# Patient Record
Sex: Female | Born: 1950
Health system: Southern US, Community
[De-identification: ages and names within clinical notes are randomized; demographics above are authoritative.]

## PROBLEM LIST (undated history)

## (undated) DIAGNOSIS — F419 Anxiety disorder, unspecified: Secondary | ICD-10-CM

## (undated) DIAGNOSIS — E039 Hypothyroidism, unspecified: Secondary | ICD-10-CM

## (undated) DIAGNOSIS — J45909 Unspecified asthma, uncomplicated: Secondary | ICD-10-CM

## (undated) DIAGNOSIS — E0520 Thyrotoxicosis with toxic multinodular goiter without thyrotoxic crisis or storm: Secondary | ICD-10-CM

## (undated) DIAGNOSIS — L659 Nonscarring hair loss, unspecified: Secondary | ICD-10-CM

## (undated) DIAGNOSIS — I4719 Other supraventricular tachycardia: Secondary | ICD-10-CM

## (undated) DIAGNOSIS — I10 Essential (primary) hypertension: Secondary | ICD-10-CM

## (undated) DIAGNOSIS — K219 Gastro-esophageal reflux disease without esophagitis: Secondary | ICD-10-CM

## (undated) DIAGNOSIS — Z972 Presence of dental prosthetic device (complete) (partial): Secondary | ICD-10-CM

## (undated) DIAGNOSIS — Z860100 Personal history of colon polyps, unspecified: Secondary | ICD-10-CM

## (undated) DIAGNOSIS — I700 Atherosclerosis of aorta: Secondary | ICD-10-CM

## (undated) DIAGNOSIS — Z973 Presence of spectacles and contact lenses: Secondary | ICD-10-CM

## (undated) DIAGNOSIS — I493 Ventricular premature depolarization: Secondary | ICD-10-CM

## (undated) DIAGNOSIS — M109 Gout, unspecified: Secondary | ICD-10-CM

## (undated) DIAGNOSIS — E785 Hyperlipidemia, unspecified: Secondary | ICD-10-CM

## (undated) DIAGNOSIS — I472 Ventricular tachycardia: Secondary | ICD-10-CM

## (undated) DIAGNOSIS — L309 Dermatitis, unspecified: Secondary | ICD-10-CM

## (undated) DIAGNOSIS — R011 Cardiac murmur, unspecified: Secondary | ICD-10-CM

## (undated) DIAGNOSIS — Z87898 Personal history of other specified conditions: Secondary | ICD-10-CM

## (undated) DIAGNOSIS — M1710 Unilateral primary osteoarthritis, unspecified knee: Secondary | ICD-10-CM

## (undated) DIAGNOSIS — M179 Osteoarthritis of knee, unspecified: Secondary | ICD-10-CM

## (undated) DIAGNOSIS — E119 Type 2 diabetes mellitus without complications: Secondary | ICD-10-CM

## (undated) HISTORY — PX: CATARACT EXTRACTION W/ INTRAOCULAR LENS IMPLANT: SHX1309

## (undated) HISTORY — DX: Cardiac murmur, unspecified: R01.1

## (undated) HISTORY — DX: Ventricular tachycardia: I47.2

## (undated) HISTORY — DX: Atherosclerosis of aorta: I70.0

## (undated) HISTORY — PX: COLONOSCOPY: SHX174

## (undated) HISTORY — DX: Ventricular premature depolarization: I49.3

## (undated) HISTORY — DX: Other supraventricular tachycardia: I47.19

## (undated) HISTORY — DX: Anxiety disorder, unspecified: F41.9

## (undated) HISTORY — PX: LAPAROSCOPIC CHOLECYSTECTOMY: SUR755

## (undated) HISTORY — PX: RASTELLI PROCEDURE: SHX2299

## (undated) HISTORY — PX: OTHER SURGICAL HISTORY: SHX169

---

## 1998-02-16 ENCOUNTER — Encounter: Admission: RE | Admit: 1998-02-16 | Discharge: 1998-02-16 | Payer: Self-pay | Admitting: Family Medicine

## 1998-03-15 ENCOUNTER — Encounter: Admission: RE | Admit: 1998-03-15 | Discharge: 1998-03-15 | Payer: Self-pay | Admitting: Sports Medicine

## 1998-03-15 ENCOUNTER — Other Ambulatory Visit: Admission: RE | Admit: 1998-03-15 | Discharge: 1998-03-15 | Payer: Self-pay

## 1998-09-05 ENCOUNTER — Encounter: Admission: RE | Admit: 1998-09-05 | Discharge: 1998-09-05 | Payer: Self-pay | Admitting: Sports Medicine

## 1998-09-23 ENCOUNTER — Encounter: Admission: RE | Admit: 1998-09-23 | Discharge: 1998-09-23 | Payer: Self-pay | Admitting: Family Medicine

## 1999-04-25 ENCOUNTER — Encounter: Admission: RE | Admit: 1999-04-25 | Discharge: 1999-04-25 | Payer: Self-pay | Admitting: Family Medicine

## 1999-05-08 ENCOUNTER — Encounter: Admission: RE | Admit: 1999-05-08 | Discharge: 1999-05-08 | Payer: Self-pay | Admitting: Family Medicine

## 1999-05-23 ENCOUNTER — Encounter: Admission: RE | Admit: 1999-05-23 | Discharge: 1999-05-23 | Payer: Self-pay | Admitting: Sports Medicine

## 1999-07-19 ENCOUNTER — Encounter (INDEPENDENT_AMBULATORY_CARE_PROVIDER_SITE_OTHER): Payer: Self-pay

## 1999-07-19 ENCOUNTER — Other Ambulatory Visit: Admission: RE | Admit: 1999-07-19 | Discharge: 1999-07-19 | Payer: Self-pay | Admitting: Obstetrics & Gynecology

## 1999-10-09 ENCOUNTER — Inpatient Hospital Stay (HOSPITAL_COMMUNITY): Admission: EM | Admit: 1999-10-09 | Discharge: 1999-10-09 | Payer: Self-pay | Admitting: Obstetrics & Gynecology

## 1999-10-17 ENCOUNTER — Encounter: Payer: Self-pay | Admitting: Obstetrics & Gynecology

## 1999-10-22 HISTORY — PX: ABDOMINAL HYSTERECTOMY: SHX81

## 1999-10-23 ENCOUNTER — Inpatient Hospital Stay (HOSPITAL_COMMUNITY): Admission: RE | Admit: 1999-10-23 | Discharge: 1999-10-26 | Payer: Self-pay | Admitting: Obstetrics & Gynecology

## 1999-10-23 ENCOUNTER — Encounter (INDEPENDENT_AMBULATORY_CARE_PROVIDER_SITE_OTHER): Payer: Self-pay

## 1999-11-29 ENCOUNTER — Encounter: Admission: RE | Admit: 1999-11-29 | Discharge: 1999-11-29 | Payer: Self-pay | Admitting: Family Medicine

## 1999-12-13 ENCOUNTER — Encounter: Admission: RE | Admit: 1999-12-13 | Discharge: 1999-12-13 | Payer: Self-pay

## 2000-11-20 ENCOUNTER — Encounter: Admission: RE | Admit: 2000-11-20 | Discharge: 2000-11-20 | Payer: Self-pay | Admitting: Family Medicine

## 2000-11-27 ENCOUNTER — Encounter: Admission: RE | Admit: 2000-11-27 | Discharge: 2000-11-27 | Payer: Self-pay | Admitting: Family Medicine

## 2000-12-27 ENCOUNTER — Encounter: Admission: RE | Admit: 2000-12-27 | Discharge: 2000-12-27 | Payer: Self-pay | Admitting: Family Medicine

## 2001-01-02 ENCOUNTER — Encounter: Payer: Self-pay | Admitting: Sports Medicine

## 2001-01-02 ENCOUNTER — Encounter: Admission: RE | Admit: 2001-01-02 | Discharge: 2001-01-02 | Payer: Self-pay | Admitting: Sports Medicine

## 2001-01-30 ENCOUNTER — Encounter: Admission: RE | Admit: 2001-01-30 | Discharge: 2001-01-30 | Payer: Self-pay | Admitting: Family Medicine

## 2001-03-10 ENCOUNTER — Encounter: Admission: RE | Admit: 2001-03-10 | Discharge: 2001-03-10 | Payer: Self-pay | Admitting: Family Medicine

## 2001-05-23 ENCOUNTER — Encounter (INDEPENDENT_AMBULATORY_CARE_PROVIDER_SITE_OTHER): Payer: Self-pay | Admitting: *Deleted

## 2001-05-23 LAB — CONVERTED CEMR LAB

## 2001-05-26 ENCOUNTER — Encounter: Admission: RE | Admit: 2001-05-26 | Discharge: 2001-05-26 | Payer: Self-pay | Admitting: Sports Medicine

## 2001-06-04 ENCOUNTER — Encounter: Admission: RE | Admit: 2001-06-04 | Discharge: 2001-06-04 | Payer: Self-pay | Admitting: Family Medicine

## 2001-06-16 ENCOUNTER — Encounter: Admission: RE | Admit: 2001-06-16 | Discharge: 2001-06-16 | Payer: Self-pay | Admitting: Family Medicine

## 2001-06-30 ENCOUNTER — Encounter: Admission: RE | Admit: 2001-06-30 | Discharge: 2001-06-30 | Payer: Self-pay | Admitting: Family Medicine

## 2001-07-31 ENCOUNTER — Encounter: Admission: RE | Admit: 2001-07-31 | Discharge: 2001-07-31 | Payer: Self-pay | Admitting: Family Medicine

## 2001-08-12 ENCOUNTER — Encounter: Payer: Self-pay | Admitting: Sports Medicine

## 2001-08-12 ENCOUNTER — Ambulatory Visit (HOSPITAL_COMMUNITY): Admission: RE | Admit: 2001-08-12 | Discharge: 2001-08-12 | Payer: Self-pay | Admitting: Sports Medicine

## 2001-08-21 ENCOUNTER — Ambulatory Visit (HOSPITAL_COMMUNITY): Admission: RE | Admit: 2001-08-21 | Discharge: 2001-08-21 | Payer: Self-pay | Admitting: Internal Medicine

## 2001-09-30 ENCOUNTER — Encounter: Admission: RE | Admit: 2001-09-30 | Discharge: 2001-09-30 | Payer: Self-pay | Admitting: Sports Medicine

## 2001-11-17 ENCOUNTER — Encounter: Admission: RE | Admit: 2001-11-17 | Discharge: 2001-11-17 | Payer: Self-pay | Admitting: Sports Medicine

## 2002-01-15 ENCOUNTER — Encounter: Admission: RE | Admit: 2002-01-15 | Discharge: 2002-01-15 | Payer: Self-pay | Admitting: Sports Medicine

## 2002-04-27 ENCOUNTER — Encounter: Admission: RE | Admit: 2002-04-27 | Discharge: 2002-04-27 | Payer: Self-pay | Admitting: Family Medicine

## 2002-05-25 ENCOUNTER — Encounter: Admission: RE | Admit: 2002-05-25 | Discharge: 2002-05-25 | Payer: Self-pay | Admitting: Family Medicine

## 2002-06-04 ENCOUNTER — Encounter: Admission: RE | Admit: 2002-06-04 | Discharge: 2002-06-04 | Payer: Self-pay | Admitting: Family Medicine

## 2002-06-30 ENCOUNTER — Encounter: Payer: Self-pay | Admitting: Sports Medicine

## 2002-06-30 ENCOUNTER — Encounter: Admission: RE | Admit: 2002-06-30 | Discharge: 2002-06-30 | Payer: Self-pay | Admitting: Sports Medicine

## 2002-07-12 ENCOUNTER — Encounter: Payer: Self-pay | Admitting: Emergency Medicine

## 2002-07-12 ENCOUNTER — Emergency Department (HOSPITAL_COMMUNITY): Admission: EM | Admit: 2002-07-12 | Discharge: 2002-07-12 | Payer: Self-pay | Admitting: Emergency Medicine

## 2002-07-30 ENCOUNTER — Encounter: Admission: RE | Admit: 2002-07-30 | Discharge: 2002-07-30 | Payer: Self-pay | Admitting: Sports Medicine

## 2002-08-28 ENCOUNTER — Emergency Department (HOSPITAL_COMMUNITY): Admission: EM | Admit: 2002-08-28 | Discharge: 2002-08-28 | Payer: Self-pay | Admitting: Emergency Medicine

## 2002-08-28 ENCOUNTER — Encounter: Payer: Self-pay | Admitting: Emergency Medicine

## 2002-08-31 ENCOUNTER — Ambulatory Visit (HOSPITAL_COMMUNITY): Admission: RE | Admit: 2002-08-31 | Discharge: 2002-08-31 | Payer: Self-pay | Admitting: Emergency Medicine

## 2002-08-31 ENCOUNTER — Encounter: Payer: Self-pay | Admitting: Emergency Medicine

## 2002-09-09 ENCOUNTER — Encounter: Admission: RE | Admit: 2002-09-09 | Discharge: 2002-09-09 | Payer: Self-pay | Admitting: Family Medicine

## 2002-09-11 ENCOUNTER — Encounter: Admission: RE | Admit: 2002-09-11 | Discharge: 2002-09-11 | Payer: Self-pay | Admitting: Family Medicine

## 2002-09-14 ENCOUNTER — Encounter: Admission: RE | Admit: 2002-09-14 | Discharge: 2002-09-14 | Payer: Self-pay | Admitting: Family Medicine

## 2002-09-21 ENCOUNTER — Encounter: Admission: RE | Admit: 2002-09-21 | Discharge: 2002-09-21 | Payer: Self-pay | Admitting: Family Medicine

## 2002-10-01 ENCOUNTER — Encounter (INDEPENDENT_AMBULATORY_CARE_PROVIDER_SITE_OTHER): Payer: Self-pay | Admitting: Specialist

## 2002-10-01 ENCOUNTER — Encounter: Payer: Self-pay | Admitting: Surgery

## 2002-10-01 ENCOUNTER — Observation Stay (HOSPITAL_COMMUNITY): Admission: RE | Admit: 2002-10-01 | Discharge: 2002-10-02 | Payer: Self-pay | Admitting: Surgery

## 2002-10-15 ENCOUNTER — Encounter: Payer: Self-pay | Admitting: Surgery

## 2002-10-15 ENCOUNTER — Ambulatory Visit (HOSPITAL_COMMUNITY): Admission: RE | Admit: 2002-10-15 | Discharge: 2002-10-15 | Payer: Self-pay | Admitting: Surgery

## 2002-12-25 ENCOUNTER — Encounter: Admission: RE | Admit: 2002-12-25 | Discharge: 2002-12-25 | Payer: Self-pay | Admitting: Family Medicine

## 2002-12-28 ENCOUNTER — Encounter: Admission: RE | Admit: 2002-12-28 | Discharge: 2002-12-28 | Payer: Self-pay | Admitting: Sports Medicine

## 2002-12-28 ENCOUNTER — Encounter: Payer: Self-pay | Admitting: Sports Medicine

## 2002-12-30 ENCOUNTER — Encounter: Admission: RE | Admit: 2002-12-30 | Discharge: 2002-12-30 | Payer: Self-pay | Admitting: Family Medicine

## 2003-02-02 ENCOUNTER — Encounter: Admission: RE | Admit: 2003-02-02 | Discharge: 2003-02-02 | Payer: Self-pay | Admitting: Family Medicine

## 2003-03-02 ENCOUNTER — Encounter: Admission: RE | Admit: 2003-03-02 | Discharge: 2003-03-02 | Payer: Self-pay | Admitting: Family Medicine

## 2003-03-11 ENCOUNTER — Ambulatory Visit (HOSPITAL_COMMUNITY): Admission: RE | Admit: 2003-03-11 | Discharge: 2003-03-11 | Payer: Self-pay | Admitting: Sports Medicine

## 2003-03-11 ENCOUNTER — Encounter: Payer: Self-pay | Admitting: Cardiovascular Disease

## 2003-03-18 ENCOUNTER — Encounter: Admission: RE | Admit: 2003-03-18 | Discharge: 2003-03-18 | Payer: Self-pay | Admitting: Family Medicine

## 2003-04-23 ENCOUNTER — Encounter: Admission: RE | Admit: 2003-04-23 | Discharge: 2003-04-23 | Payer: Self-pay | Admitting: Sports Medicine

## 2003-10-25 ENCOUNTER — Encounter: Admission: RE | Admit: 2003-10-25 | Discharge: 2003-10-25 | Payer: Self-pay | Admitting: Family Medicine

## 2003-10-31 ENCOUNTER — Ambulatory Visit (HOSPITAL_BASED_OUTPATIENT_CLINIC_OR_DEPARTMENT_OTHER): Admission: RE | Admit: 2003-10-31 | Discharge: 2003-10-31 | Payer: Self-pay | Admitting: Sports Medicine

## 2003-11-08 ENCOUNTER — Encounter: Admission: RE | Admit: 2003-11-08 | Discharge: 2003-11-08 | Payer: Self-pay | Admitting: Sports Medicine

## 2003-11-11 ENCOUNTER — Encounter: Admission: RE | Admit: 2003-11-11 | Discharge: 2003-11-11 | Payer: Self-pay | Admitting: Family Medicine

## 2003-11-15 ENCOUNTER — Encounter: Admission: RE | Admit: 2003-11-15 | Discharge: 2003-11-15 | Payer: Self-pay | Admitting: Family Medicine

## 2003-11-21 HISTORY — PX: RENAL ARTERY STENT: SHX2321

## 2003-11-23 ENCOUNTER — Ambulatory Visit (HOSPITAL_COMMUNITY): Admission: RE | Admit: 2003-11-23 | Discharge: 2003-11-23 | Payer: Self-pay | Admitting: Sports Medicine

## 2003-12-09 ENCOUNTER — Ambulatory Visit (HOSPITAL_COMMUNITY): Admission: RE | Admit: 2003-12-09 | Discharge: 2003-12-09 | Payer: Self-pay | Admitting: *Deleted

## 2003-12-16 ENCOUNTER — Encounter: Admission: RE | Admit: 2003-12-16 | Discharge: 2003-12-16 | Payer: Self-pay | Admitting: Family Medicine

## 2004-02-11 ENCOUNTER — Encounter: Admission: RE | Admit: 2004-02-11 | Discharge: 2004-02-11 | Payer: Self-pay | Admitting: Family Medicine

## 2004-03-29 ENCOUNTER — Ambulatory Visit: Payer: Self-pay | Admitting: Family Medicine

## 2004-05-22 ENCOUNTER — Encounter: Admission: RE | Admit: 2004-05-22 | Discharge: 2004-05-22 | Payer: Self-pay | Admitting: Sports Medicine

## 2004-06-29 ENCOUNTER — Ambulatory Visit (HOSPITAL_COMMUNITY): Admission: RE | Admit: 2004-06-29 | Discharge: 2004-06-29 | Payer: Self-pay | Admitting: Gastroenterology

## 2004-06-29 ENCOUNTER — Encounter (INDEPENDENT_AMBULATORY_CARE_PROVIDER_SITE_OTHER): Payer: Self-pay | Admitting: *Deleted

## 2004-09-01 ENCOUNTER — Ambulatory Visit: Payer: Self-pay | Admitting: Family Medicine

## 2004-09-04 ENCOUNTER — Ambulatory Visit: Payer: Self-pay | Admitting: Family Medicine

## 2004-09-12 ENCOUNTER — Ambulatory Visit: Payer: Self-pay | Admitting: Sports Medicine

## 2004-09-27 ENCOUNTER — Ambulatory Visit: Payer: Self-pay | Admitting: Family Medicine

## 2004-10-02 ENCOUNTER — Ambulatory Visit: Payer: Self-pay | Admitting: Family Medicine

## 2005-01-31 ENCOUNTER — Ambulatory Visit: Payer: Self-pay | Admitting: Family Medicine

## 2005-02-08 ENCOUNTER — Encounter: Admission: RE | Admit: 2005-02-08 | Discharge: 2005-02-08 | Payer: Self-pay | Admitting: Sports Medicine

## 2005-02-20 HISTORY — PX: RENAL ARTERY STENT: SHX2321

## 2005-02-21 ENCOUNTER — Ambulatory Visit: Payer: Self-pay | Admitting: Internal Medicine

## 2005-02-22 ENCOUNTER — Ambulatory Visit (HOSPITAL_COMMUNITY): Admission: RE | Admit: 2005-02-22 | Discharge: 2005-02-22 | Payer: Self-pay | Admitting: Sports Medicine

## 2005-03-20 ENCOUNTER — Ambulatory Visit: Payer: Self-pay | Admitting: Internal Medicine

## 2005-03-28 ENCOUNTER — Ambulatory Visit: Payer: Self-pay | Admitting: Internal Medicine

## 2005-04-30 ENCOUNTER — Ambulatory Visit: Payer: Self-pay | Admitting: Internal Medicine

## 2005-05-01 ENCOUNTER — Ambulatory Visit: Payer: Self-pay | Admitting: Internal Medicine

## 2005-05-24 ENCOUNTER — Ambulatory Visit: Payer: Self-pay | Admitting: Internal Medicine

## 2005-05-31 ENCOUNTER — Ambulatory Visit: Payer: Self-pay | Admitting: Internal Medicine

## 2005-08-28 ENCOUNTER — Encounter: Admission: RE | Admit: 2005-08-28 | Discharge: 2005-08-28 | Payer: Self-pay | Admitting: Internal Medicine

## 2005-09-20 ENCOUNTER — Ambulatory Visit: Payer: Self-pay | Admitting: Internal Medicine

## 2005-10-02 ENCOUNTER — Ambulatory Visit: Payer: Self-pay | Admitting: Internal Medicine

## 2005-11-22 ENCOUNTER — Ambulatory Visit: Payer: Self-pay | Admitting: Internal Medicine

## 2005-12-06 ENCOUNTER — Ambulatory Visit: Payer: Self-pay | Admitting: Internal Medicine

## 2005-12-19 ENCOUNTER — Encounter: Admission: RE | Admit: 2005-12-19 | Discharge: 2005-12-19 | Payer: Self-pay | Admitting: Surgery

## 2005-12-20 ENCOUNTER — Encounter (INDEPENDENT_AMBULATORY_CARE_PROVIDER_SITE_OTHER): Payer: Self-pay | Admitting: *Deleted

## 2005-12-20 ENCOUNTER — Ambulatory Visit (HOSPITAL_BASED_OUTPATIENT_CLINIC_OR_DEPARTMENT_OTHER): Admission: RE | Admit: 2005-12-20 | Discharge: 2005-12-20 | Payer: Self-pay | Admitting: Surgery

## 2006-03-13 ENCOUNTER — Ambulatory Visit: Payer: Self-pay | Admitting: Internal Medicine

## 2006-05-17 ENCOUNTER — Ambulatory Visit: Payer: Self-pay | Admitting: Internal Medicine

## 2006-05-27 ENCOUNTER — Ambulatory Visit: Payer: Self-pay | Admitting: Internal Medicine

## 2006-05-27 LAB — CONVERTED CEMR LAB
Calcium: 9.5 mg/dL (ref 8.4–10.5)
Creatinine, Ser: 1.3 mg/dL — ABNORMAL HIGH (ref 0.4–1.2)
Sodium: 139 meq/L (ref 135–145)

## 2006-06-14 ENCOUNTER — Ambulatory Visit: Payer: Self-pay | Admitting: Internal Medicine

## 2006-06-15 ENCOUNTER — Emergency Department (HOSPITAL_COMMUNITY): Admission: EM | Admit: 2006-06-15 | Discharge: 2006-06-15 | Payer: Self-pay | Admitting: Emergency Medicine

## 2006-06-25 ENCOUNTER — Ambulatory Visit: Payer: Self-pay | Admitting: Internal Medicine

## 2006-09-19 DIAGNOSIS — E049 Nontoxic goiter, unspecified: Secondary | ICD-10-CM

## 2006-09-19 DIAGNOSIS — G4700 Insomnia, unspecified: Secondary | ICD-10-CM | POA: Insufficient documentation

## 2006-09-19 DIAGNOSIS — I10 Essential (primary) hypertension: Secondary | ICD-10-CM

## 2006-09-19 DIAGNOSIS — L2089 Other atopic dermatitis: Secondary | ICD-10-CM | POA: Insufficient documentation

## 2006-09-19 DIAGNOSIS — J45909 Unspecified asthma, uncomplicated: Secondary | ICD-10-CM | POA: Insufficient documentation

## 2006-09-20 ENCOUNTER — Encounter (INDEPENDENT_AMBULATORY_CARE_PROVIDER_SITE_OTHER): Payer: Self-pay | Admitting: *Deleted

## 2006-09-30 ENCOUNTER — Ambulatory Visit: Payer: Self-pay | Admitting: Internal Medicine

## 2006-09-30 LAB — CONVERTED CEMR LAB
Chloride: 103 meq/L (ref 96–112)
GFR calc Af Amer: 60 mL/min
GFR calc non Af Amer: 50 mL/min
Potassium: 4.3 meq/L (ref 3.5–5.1)

## 2006-10-03 ENCOUNTER — Ambulatory Visit: Payer: Self-pay | Admitting: Internal Medicine

## 2006-10-14 ENCOUNTER — Encounter: Admission: RE | Admit: 2006-10-14 | Discharge: 2006-10-14 | Payer: Self-pay | Admitting: Internal Medicine

## 2007-02-07 ENCOUNTER — Ambulatory Visit: Payer: Self-pay | Admitting: Internal Medicine

## 2007-02-26 DIAGNOSIS — Z860100 Personal history of colon polyps, unspecified: Secondary | ICD-10-CM | POA: Insufficient documentation

## 2007-02-26 DIAGNOSIS — I10 Essential (primary) hypertension: Secondary | ICD-10-CM | POA: Insufficient documentation

## 2007-08-20 ENCOUNTER — Encounter: Payer: Self-pay | Admitting: Internal Medicine

## 2007-08-25 ENCOUNTER — Encounter: Payer: Self-pay | Admitting: Internal Medicine

## 2007-10-01 ENCOUNTER — Ambulatory Visit: Payer: Self-pay | Admitting: Internal Medicine

## 2007-10-01 DIAGNOSIS — R7309 Other abnormal glucose: Secondary | ICD-10-CM | POA: Insufficient documentation

## 2007-10-01 DIAGNOSIS — M25569 Pain in unspecified knee: Secondary | ICD-10-CM

## 2007-10-01 DIAGNOSIS — B009 Herpesviral infection, unspecified: Secondary | ICD-10-CM | POA: Insufficient documentation

## 2007-10-01 DIAGNOSIS — E785 Hyperlipidemia, unspecified: Secondary | ICD-10-CM | POA: Insufficient documentation

## 2007-10-01 LAB — CONVERTED CEMR LAB
ALT: 12 units/L (ref 0–35)
Alkaline Phosphatase: 115 units/L (ref 39–117)
Bilirubin, Direct: 0.1 mg/dL (ref 0.0–0.3)
CO2: 29 meq/L (ref 19–32)
Chloride: 106 meq/L (ref 96–112)
Cholesterol: 201 mg/dL (ref 0–200)
Direct LDL: 133.7 mg/dL
HDL: 49.1 mg/dL (ref >39.0)
Total CHOL/HDL Ratio: 4.1
Triglycerides: 85 mg/dL (ref 0–149)

## 2007-10-05 ENCOUNTER — Encounter: Payer: Self-pay | Admitting: Internal Medicine

## 2007-10-15 ENCOUNTER — Encounter: Admission: RE | Admit: 2007-10-15 | Discharge: 2007-10-15 | Payer: Self-pay | Admitting: Internal Medicine

## 2007-12-30 ENCOUNTER — Ambulatory Visit: Payer: Self-pay | Admitting: Internal Medicine

## 2007-12-30 DIAGNOSIS — L659 Nonscarring hair loss, unspecified: Secondary | ICD-10-CM | POA: Insufficient documentation

## 2008-01-14 ENCOUNTER — Telehealth: Payer: Self-pay | Admitting: Internal Medicine

## 2008-01-14 ENCOUNTER — Ambulatory Visit: Payer: Self-pay | Admitting: Internal Medicine

## 2008-01-14 LAB — CONVERTED CEMR LAB
ALT: 11 units/L (ref 0–35)
AST: 17 units/L (ref 0–37)

## 2008-02-09 ENCOUNTER — Ambulatory Visit: Payer: Self-pay | Admitting: Internal Medicine

## 2008-02-09 DIAGNOSIS — D649 Anemia, unspecified: Secondary | ICD-10-CM | POA: Insufficient documentation

## 2008-02-09 DIAGNOSIS — K219 Gastro-esophageal reflux disease without esophagitis: Secondary | ICD-10-CM | POA: Insufficient documentation

## 2008-02-11 ENCOUNTER — Telehealth (INDEPENDENT_AMBULATORY_CARE_PROVIDER_SITE_OTHER): Payer: Self-pay | Admitting: *Deleted

## 2008-04-07 ENCOUNTER — Telehealth (INDEPENDENT_AMBULATORY_CARE_PROVIDER_SITE_OTHER): Payer: Self-pay | Admitting: *Deleted

## 2008-04-21 ENCOUNTER — Ambulatory Visit: Payer: Self-pay | Admitting: Internal Medicine

## 2008-04-23 ENCOUNTER — Emergency Department (HOSPITAL_COMMUNITY): Admission: EM | Admit: 2008-04-23 | Discharge: 2008-04-23 | Payer: Self-pay | Admitting: Family Medicine

## 2008-05-17 ENCOUNTER — Ambulatory Visit: Payer: Self-pay | Admitting: Internal Medicine

## 2008-05-26 ENCOUNTER — Telehealth: Payer: Self-pay | Admitting: Internal Medicine

## 2008-08-02 ENCOUNTER — Ambulatory Visit: Payer: Self-pay | Admitting: Internal Medicine

## 2008-08-02 LAB — CONVERTED CEMR LAB
Alkaline Phosphatase: 84 units/L (ref 39–117)
Bilirubin, Direct: 0.1 mg/dL (ref 0.0–0.3)
Chloride: 103 meq/L (ref 96–112)
Creatinine, Ser: 1 mg/dL (ref 0.4–1.2)
Crystals: NEGATIVE
Direct LDL: 147.5 mg/dL
Eosinophils Relative: 0.9 % (ref 0.0–5.0)
GFR calc Af Amer: 74 mL/min
GFR calc non Af Amer: 61 mL/min
Glucose, Bld: 107 mg/dL — ABNORMAL HIGH (ref 70–99)
HCT: 41.1 % (ref 36.0–46.0)
Hemoglobin, Urine: NEGATIVE
Hemoglobin: 14.1 g/dL (ref 12.0–15.0)
Hgb A1c MFr Bld: 5.8 % (ref 4.6–6.0)
MCHC: 34.4 g/dL (ref 30.0–36.0)
Mucus, UA: NEGATIVE
Potassium: 4.1 meq/L (ref 3.5–5.1)
RBC: 4.93 M/uL (ref 3.87–5.11)
RDW: 12.9 % (ref 11.5–14.6)
Specific Gravity, Urine: 1.01 (ref 1.000–1.03)
Total CHOL/HDL Ratio: 4.8
Triglycerides: 99 mg/dL (ref 0–149)
Urobilinogen, UA: 0.2 (ref 0.0–1.0)
WBC: 6.3 10*3/uL (ref 4.5–10.5)

## 2008-08-09 ENCOUNTER — Ambulatory Visit: Payer: Self-pay | Admitting: Internal Medicine

## 2008-08-09 DIAGNOSIS — M109 Gout, unspecified: Secondary | ICD-10-CM | POA: Insufficient documentation

## 2008-08-09 DIAGNOSIS — L989 Disorder of the skin and subcutaneous tissue, unspecified: Secondary | ICD-10-CM | POA: Insufficient documentation

## 2008-08-12 ENCOUNTER — Encounter: Payer: Self-pay | Admitting: Internal Medicine

## 2008-08-12 ENCOUNTER — Ambulatory Visit: Payer: Self-pay | Admitting: Family Medicine

## 2008-09-29 ENCOUNTER — Encounter: Payer: Self-pay | Admitting: Internal Medicine

## 2008-11-29 ENCOUNTER — Ambulatory Visit: Payer: Self-pay | Admitting: Internal Medicine

## 2008-11-29 DIAGNOSIS — R350 Frequency of micturition: Secondary | ICD-10-CM | POA: Insufficient documentation

## 2008-11-29 DIAGNOSIS — E1122 Type 2 diabetes mellitus with diabetic chronic kidney disease: Secondary | ICD-10-CM | POA: Insufficient documentation

## 2008-11-29 DIAGNOSIS — E119 Type 2 diabetes mellitus without complications: Secondary | ICD-10-CM | POA: Insufficient documentation

## 2008-11-29 LAB — CONVERTED CEMR LAB
Bilirubin Urine: NEGATIVE
Ketones, ur: NEGATIVE mg/dL
Nitrite: NEGATIVE
Specific Gravity, Urine: 1.015 (ref 1.000–1.030)
Total Protein, Urine: NEGATIVE mg/dL
Urine Glucose: NEGATIVE mg/dL
Urobilinogen, UA: 0.2 (ref 0.0–1.0)

## 2009-02-28 ENCOUNTER — Ambulatory Visit: Payer: Self-pay | Admitting: Internal Medicine

## 2009-02-28 DIAGNOSIS — R079 Chest pain, unspecified: Secondary | ICD-10-CM

## 2009-02-28 DIAGNOSIS — H60399 Other infective otitis externa, unspecified ear: Secondary | ICD-10-CM | POA: Insufficient documentation

## 2009-03-03 ENCOUNTER — Encounter: Payer: Self-pay | Admitting: Cardiovascular Disease

## 2009-03-09 ENCOUNTER — Telehealth (INDEPENDENT_AMBULATORY_CARE_PROVIDER_SITE_OTHER): Payer: Self-pay | Admitting: *Deleted

## 2009-03-10 ENCOUNTER — Ambulatory Visit: Payer: Self-pay

## 2009-03-21 ENCOUNTER — Telehealth: Payer: Self-pay | Admitting: Internal Medicine

## 2009-04-11 ENCOUNTER — Ambulatory Visit: Payer: Self-pay | Admitting: Internal Medicine

## 2009-04-11 LAB — CONVERTED CEMR LAB
BUN: 16 mg/dL (ref 6–23)
Cholesterol: 168 mg/dL (ref 0–200)
Creatinine, Ser: 1 mg/dL (ref 0.4–1.2)
GFR calc non Af Amer: 73.43 mL/min (ref >60)
HDL: 47.2 mg/dL (ref >39.00)
LDL Cholesterol: 107 mg/dL — ABNORMAL HIGH (ref 0–99)
Potassium: 3.9 meq/L (ref 3.5–5.1)
Total CHOL/HDL Ratio: 4
Triglycerides: 68 mg/dL (ref 0.0–149.0)

## 2009-05-03 ENCOUNTER — Ambulatory Visit: Payer: Self-pay | Admitting: Internal Medicine

## 2009-05-20 ENCOUNTER — Ambulatory Visit: Payer: Self-pay | Admitting: Internal Medicine

## 2009-05-20 DIAGNOSIS — J019 Acute sinusitis, unspecified: Secondary | ICD-10-CM | POA: Insufficient documentation

## 2009-08-02 ENCOUNTER — Encounter: Payer: Self-pay | Admitting: Internal Medicine

## 2009-09-05 ENCOUNTER — Ambulatory Visit: Payer: Self-pay | Admitting: Internal Medicine

## 2009-09-05 DIAGNOSIS — R5381 Other malaise: Secondary | ICD-10-CM

## 2009-09-05 DIAGNOSIS — R5383 Other fatigue: Secondary | ICD-10-CM

## 2009-09-06 ENCOUNTER — Encounter (INDEPENDENT_AMBULATORY_CARE_PROVIDER_SITE_OTHER): Payer: Self-pay | Admitting: *Deleted

## 2009-09-06 LAB — CONVERTED CEMR LAB
ALT: 11 units/L (ref 0–35)
AST: 17 units/L (ref 0–37)
Alkaline Phosphatase: 97 units/L (ref 39–117)
Basophils Absolute: 0 10*3/uL (ref 0.0–0.1)
Creatinine, Ser: 1 mg/dL (ref 0.4–1.2)
Creatinine,U: 81.7 mg/dL
Eosinophils Relative: 0.6 % (ref 0.0–5.0)
Folate: 6.6 ng/mL
Glucose, Bld: 99 mg/dL (ref 70–99)
HDL: 60.1 mg/dL (ref >39.00)
Hemoglobin: 13.6 g/dL (ref 12.0–15.0)
Hgb A1c MFr Bld: 5.8 % (ref 4.6–6.5)
Iron: 41 ug/dL — ABNORMAL LOW (ref 42–145)
Leukocytes, UA: NEGATIVE
Lymphocytes Relative: 28.9 % (ref 12.0–46.0)
MCHC: 32.4 g/dL (ref 30.0–36.0)
MCV: 84.7 fL (ref 78.0–100.0)
Microalb, Ur: 0.6 mg/dL (ref 0.0–1.9)
Neutrophils Relative %: 61.3 % (ref 43.0–77.0)
Platelets: 217 10*3/uL (ref 150.0–400.0)
RDW: 13.4 % (ref 11.5–14.6)
Saturation Ratios: 13.2 % — ABNORMAL LOW (ref 20.0–50.0)
Sed Rate: 31 mm/hr — ABNORMAL HIGH (ref 0–22)
Total CHOL/HDL Ratio: 4
Total Protein, Urine: NEGATIVE mg/dL
Triglycerides: 131 mg/dL (ref 0.0–149.0)
Uric Acid, Serum: 5.5 mg/dL (ref 2.4–7.0)
Urine Glucose: NEGATIVE mg/dL
VLDL: 26.2 mg/dL (ref 0.0–40.0)
Vit D, 25-Hydroxy: 12 ng/mL — ABNORMAL LOW (ref 30–89)
WBC: 5.8 10*3/uL (ref 4.5–10.5)
pH: 7 (ref 5.0–8.0)

## 2009-09-23 ENCOUNTER — Encounter (INDEPENDENT_AMBULATORY_CARE_PROVIDER_SITE_OTHER): Payer: Self-pay | Admitting: *Deleted

## 2009-09-29 ENCOUNTER — Ambulatory Visit: Payer: Self-pay | Admitting: Gastroenterology

## 2009-10-11 ENCOUNTER — Ambulatory Visit: Payer: Self-pay | Admitting: Gastroenterology

## 2009-10-13 ENCOUNTER — Encounter: Payer: Self-pay | Admitting: Gastroenterology

## 2009-11-07 ENCOUNTER — Telehealth: Payer: Self-pay | Admitting: Internal Medicine

## 2009-12-30 ENCOUNTER — Ambulatory Visit: Payer: Self-pay | Admitting: Internal Medicine

## 2009-12-30 ENCOUNTER — Encounter (INDEPENDENT_AMBULATORY_CARE_PROVIDER_SITE_OTHER): Payer: Self-pay | Admitting: *Deleted

## 2009-12-30 DIAGNOSIS — R062 Wheezing: Secondary | ICD-10-CM

## 2010-03-02 ENCOUNTER — Encounter: Admission: RE | Admit: 2010-03-02 | Discharge: 2010-03-02 | Payer: Self-pay | Admitting: Internal Medicine

## 2010-03-06 ENCOUNTER — Ambulatory Visit: Payer: Self-pay | Admitting: Internal Medicine

## 2010-03-06 LAB — CONVERTED CEMR LAB
Bilirubin Urine: NEGATIVE
CO2: 30 meq/L (ref 19–32)
Calcium: 9.6 mg/dL (ref 8.4–10.5)
Cholesterol: 157 mg/dL (ref 0–200)
HDL: 50.7 mg/dL (ref >39.00)
Hgb A1c MFr Bld: 6 % (ref 4.6–6.5)
Ketones, ur: NEGATIVE mg/dL
Nitrite: NEGATIVE
Potassium: 4.5 meq/L (ref 3.5–5.1)
Specific Gravity, Urine: 1.02 (ref 1.000–1.030)
Total Protein, Urine: NEGATIVE mg/dL
VLDL: 12.4 mg/dL (ref 0.0–40.0)

## 2010-04-10 ENCOUNTER — Ambulatory Visit: Payer: Self-pay | Admitting: Internal Medicine

## 2010-04-14 ENCOUNTER — Telehealth: Payer: Self-pay | Admitting: Internal Medicine

## 2010-04-24 ENCOUNTER — Encounter: Payer: Self-pay | Admitting: Internal Medicine

## 2010-06-08 ENCOUNTER — Ambulatory Visit: Payer: Self-pay | Admitting: Internal Medicine

## 2010-07-01 ENCOUNTER — Emergency Department (HOSPITAL_COMMUNITY)
Admission: EM | Admit: 2010-07-01 | Discharge: 2010-07-01 | Payer: Self-pay | Source: Home / Self Care | Admitting: Emergency Medicine

## 2010-07-10 ENCOUNTER — Ambulatory Visit: Payer: Self-pay | Admitting: Internal Medicine

## 2010-07-31 ENCOUNTER — Encounter: Payer: Self-pay | Admitting: Internal Medicine

## 2010-08-13 ENCOUNTER — Encounter: Payer: Self-pay | Admitting: Sports Medicine

## 2010-08-22 NOTE — Assessment & Plan Note (Signed)
Summary: 6 mos f/u #/cd   Vital Signs:  Patient profile:   60 year old female Height:      63 inches Weight:      183.25 pounds BMI:     32.58 O2 Sat:      99 % on Room air Temp:     97.2 degrees F oral Pulse rate:   72 / minute BP sitting:   142 / 88  (left arm) Cuff size:   regular  Vitals Entered By: Zella Ball Ewing CMA Duncan Dull) (March 06, 2010 9:35 AM)  O2 Flow:  Room air CC: 6 month followup/RE   CC:  6 month followup/RE.  History of Present Illness: overall doing ok;  Pt denies CP, sob, doe, wheezing, orthopnea, pnd, worsening LE edema, palps, dizziness or syncope  Pt denies new neuro symptoms such as headache, facial or extremity weakness  Pt denies polydipsia, polyuria, or low sugar symptoms such as shakiness improved with eating.  Overall good compliance with meds, trying to follow low chol, DM diet, wt stable, little excercise however ; but also has had marked urinary freq for 2 wks with some mild discomfort and odor to urine, without blood, urgency, back pain, n/v or high fever, chills.  Blood sugar was 275 earilier today - usually much better than that in the lower 100's  Problems Prior to Update: 1)  Wheezing  (ICD-786.07) 2)  Fatigue  (ICD-780.79) 3)  Sinusitis- Acute-nos  (ICD-461.9) 4)  Otitis Externa, Right  (ICD-380.10) 5)  Chest Pain  (ICD-786.50) 6)  Diabetes Mellitus, Type II  (ICD-250.00) 7)  Urinary Frequency  (ICD-788.41) 8)  Skin Lesion  (ICD-709.9) 9)  Gout  (ICD-274.9) 10)  Preventive Health Care  (ICD-V70.0) 11)  Knee Pain, Left  (ICD-719.46) 12)  Gerd  (ICD-530.81) 13)  Anemia-nos  (ICD-285.9) 14)  Asthma  (ICD-493.90) 15)  Alopecia  (ICD-704.00) 16)  Knee Pain, Right  (ICD-719.46) 17)  Cold Sore  (ICD-054.9) 18)  Hyperlipidemia  (ICD-272.4) 19)  Other Abnormal Glucose  (ICD-790.29) 20)  Hypertension  (ICD-401.9) 21)  Colonic Polyps, Hx of  (ICD-V12.72) 22)  Insomnia Nos  (ICD-780.52) 23)  Hypertension, Benign Systemic  (ICD-401.1) 24)  Goiter  Nos  (ICD-240.9) 25)  Eczema, Atopic Dermatitis  (ICD-691.8) 26)  Asthma, Unspecified  (ICD-493.90)  Medications Prior to Update: 1)  Bystolic 10 Mg  Tabs (Nebivolol Hcl) .... One By Mouth Qd 2)  Omeprazole 20 Mg Cpdr (Omeprazole) .... Take 1 Capsule By Mouth Twice A Day 3)  Qvar 80 Mcg/act  Aers (Beclomethasone Dipropionate) .... Two Times A Day 4)  Albuterol 90 Mcg/act  Aers (Albuterol) .... As Needed 5)  Singulair 10 Mg  Tabs (Montelukast Sodium) .... Take 1 Tablet By Mouth Once A Day 6)  Norvasc 10 Mg  Tabs (Amlodipine Besylate) .... Take 1 Tablet By Mouth Once A Day 7)  Simvastatin 40 Mg Tabs (Simvastatin) .Marland Kitchen.. 1po Once Daily 8)  Voltaren 1 %  Gel (Diclofenac Sodium) .... Apply Three Times A Day - Qid 9)  Benicar Hct 40-25 Mg Tabs (Olmesartan Medoxomil-Hctz) .Marland Kitchen.. 1 By Mouth Once Daily 10)  Ibuprofen 400 Mg Tabs (Ibuprofen) .Marland Kitchen.. 1po Q 6 Hrs As Needed 11)  Azithromycin 250 Mg Tabs (Azithromycin) .... 2po Qd For 1 Day, Then 1po Qd For 4days, Then Stop 12)  Metformin Hcl 500 Mg Tabs (Metformin Hcl) .Marland Kitchen.. 1po Once Daily 13)  Cortisporin 3.5-10000-1 Soln (Neomycin-Polymyxin-Hc) .... 2 Gtts Right Ear Four Times Per Day For 10 Days 14)  Alprazolam  0.25 Mg Tabs (Alprazolam) .Marland Kitchen.. 1 By Mouth Two Times A Day As Needed 15)  Zolpidem Tartrate 10 Mg Tabs (Zolpidem Tartrate) .Marland Kitchen.. 1 By Mouth At Bedtime As Needed Sleep 16)  Hydrocodone-Homatropine 5-1.5 Mg/26ml Syrp (Hydrocodone-Homatropine) .Marland Kitchen.. 1 Tsp By Mouth Q 6 Hrs As Needed 17)  Prednisone 10 Mg Tabs (Prednisone) .... 3po Qd For 3days, Then 2po Qd For 3days, Then 1po Qd For 3days, Then Stop  Current Medications (verified): 1)  Bystolic 10 Mg  Tabs (Nebivolol Hcl) .... One By Mouth Qd 2)  Omeprazole 20 Mg Cpdr (Omeprazole) .... Take 1 Capsule By Mouth Twice A Day 3)  Qvar 80 Mcg/act  Aers (Beclomethasone Dipropionate) .... Two Times A Day 4)  Albuterol 90 Mcg/act  Aers (Albuterol) .... As Needed 5)  Singulair 10 Mg  Tabs (Montelukast Sodium) ....  Take 1 Tablet By Mouth Once A Day 6)  Norvasc 10 Mg  Tabs (Amlodipine Besylate) .... Take 1 Tablet By Mouth Once A Day 7)  Simvastatin 40 Mg Tabs (Simvastatin) .Marland Kitchen.. 1po Once Daily 8)  Voltaren 1 %  Gel (Diclofenac Sodium) .... Apply Three Times A Day - Qid 9)  Benicar Hct 40-25 Mg Tabs (Olmesartan Medoxomil-Hctz) .Marland Kitchen.. 1 By Mouth Once Daily 10)  Ibuprofen 400 Mg Tabs (Ibuprofen) .Marland Kitchen.. 1po Q 6 Hrs As Needed 11)  Azithromycin 250 Mg Tabs (Azithromycin) .... 2po Qd For 1 Day, Then 1po Qd For 4days, Then Stop 12)  Metformin Hcl 500 Mg Tabs (Metformin Hcl) .Marland Kitchen.. 1po Once Daily 13)  Cortisporin 3.5-10000-1 Soln (Neomycin-Polymyxin-Hc) .... 2 Gtts Right Ear Four Times Per Day For 10 Days 14)  Alprazolam 0.25 Mg Tabs (Alprazolam) .Marland Kitchen.. 1 By Mouth Two Times A Day As Needed 15)  Zolpidem Tartrate 10 Mg Tabs (Zolpidem Tartrate) .Marland Kitchen.. 1 By Mouth At Bedtime As Needed Sleep 16)  Hydrocodone-Homatropine 5-1.5 Mg/32ml Syrp (Hydrocodone-Homatropine) .Marland Kitchen.. 1 Tsp By Mouth Q 6 Hrs As Needed 17)  Prednisone 10 Mg Tabs (Prednisone) .... 3po Qd For 3days, Then 2po Qd For 3days, Then 1po Qd For 3days, Then Stop 18)  Ciprofloxacin Hcl 500 Mg Tabs (Ciprofloxacin Hcl) .Marland Kitchen.. 1 By Mouth  Two Times A Day  Allergies (verified): 1)  ! Ace Inhibitors 2)  ! Codeine  Past History:  Past Medical History: Last updated: 11/29/2008 L renal artery stenosis on angiogram 5/05- treated, morphea (dark skin lesions) 05/2002,  neurofibromatosis L renal art fibromucular dysplasia w/angioplasty w/good result by Dr. Fredia Sorrow x2 Toxic multinodular goiter 2/03-ablated 3/03,  Colonic polyps, hx of Hypertension Renal Artery Stenosis glucose intolerance Asthma DJD Anemia-NOS GERD Hyperlipidemia Gout Diabetes mellitus, type II  Past Surgical History: Last updated: 09/19/2006 Cholecystectomy - 09/21/2002, Echo 11/98 and 8/04 - wnl, EF 55-65% -, Hysterectomy & BSO - 10/22/1999, L RAS re-stented - 03/08/2005, L RAS tx`d w/ angioplasty -  11/21/2003, Left cataract surgery -, radioactive iodine ablate 3/03 thyroid - 10/07/2001, RAST 6/98 - neg -, Split sleep study neg for sleep apnea - 11/15/2003  Social History: Last updated: 08/09/2008 Husband Hilda Blades; two sons.  No tobacco, EtOH, drugs.  Works as a Engineering geologist       all otherwise negative per pt -    Physical Exam  General:  alert and overweight-appearing.   Head:  normocephalic and atraumatic.   Eyes:  vision grossly intact, pupils equal, and pupils round.   Ears:  R ear normal and L ear normal.   Nose:  no external deformity and no nasal discharge.   Mouth:  no gingival abnormalities and pharynx pink and moist.   Neck:  supple and no masses.   Lungs:  normal respiratory effort and normal breath sounds.   Heart:  normal rate and regular rhythm.   Abdomen:  soft and normal bowel sounds.  with mild low mid abd tender without guarding or rebound Msk:  no flank pain or spine tenderness Extremities:  no edema, no erythema    Impression & Recommendations:  Problem # 1:  FREQUENCY, URINARY (ICD-788.41)  ? due to elev sugar vs UTI vs OAB - for urine studies, cipro empiric, and f/u DM studies  Orders: TLB-Udip w/ Micro (81001-URINE) T-Culture, Urine ([id number])  Problem # 2:  DIABETES MELLITUS, TYPE II (ICD-250.00)  Her updated medication list for this problem includes:    Benicar Hct 40-25 Mg Tabs (Olmesartan medoxomil-hctz) .Marland Kitchen... 1 by mouth once daily    Metformin Hcl 500 Mg Tabs (Metformin hcl) .Marland Kitchen... 1po once daily  Labs Reviewed: Creat: 1.0 (09/05/2009)    Reviewed HgBA1c results: 5.8 (09/05/2009)  5.8 (04/11/2009) has been stable, to f/u labs already drawn this am, Continue all previous medications as before this visit   Problem # 3:  HYPERTENSION (ICD-401.9)  Her updated medication list for this problem includes:    Bystolic 10 Mg Tabs (Nebivolol hcl) ..... One by mouth qd    Norvasc 10 Mg Tabs (Amlodipine besylate) .Marland Kitchen... Take 1  tablet by mouth once a day    Benicar Hct 40-25 Mg Tabs (Olmesartan medoxomil-hctz) .Marland Kitchen... 1 by mouth once daily  BP today: 142/88 Prior BP: 142/80 (12/30/2009)  Labs Reviewed: K+: 3.9 (09/05/2009) Creat: : 1.0 (09/05/2009)   Chol: 214 (09/05/2009)   HDL: 60.10 (09/05/2009)   LDL: 107 (04/11/2009)   TG: 131.0 (09/05/2009) still mild elev, pt declines change in meds today, ok to follow, continue same treatment   Problem # 4:  HYPERLIPIDEMIA (ICD-272.4)  Her updated medication list for this problem includes:    Simvastatin 40 Mg Tabs (Simvastatin) .Marland Kitchen... 1po once daily  Labs Reviewed: SGOT: 17 (09/05/2009)   SGPT: 11 (09/05/2009)   HDL:60.10 (09/05/2009), 47.20 (04/11/2009)  LDL:107 (04/11/2009), DEL (08/02/2008)  Chol:214 (09/05/2009), 168 (04/11/2009)  Trig:131.0 (09/05/2009), 68.0 (04/11/2009) stable overall by hx and exam, ok to continue meds/tx as is  - to Continue all previous medications as before this visit, f/u labs dones this am  Complete Medication List: 1)  Bystolic 10 Mg Tabs (Nebivolol hcl) .... One by mouth qd 2)  Omeprazole 20 Mg Cpdr (Omeprazole) .... Take 1 capsule by mouth twice a day 3)  Qvar 80 Mcg/act Aers (Beclomethasone dipropionate) .... Two times a day 4)  Albuterol 90 Mcg/act Aers (Albuterol) .... As needed 5)  Singulair 10 Mg Tabs (Montelukast sodium) .... Take 1 tablet by mouth once a day 6)  Norvasc 10 Mg Tabs (Amlodipine besylate) .... Take 1 tablet by mouth once a day 7)  Simvastatin 40 Mg Tabs (Simvastatin) .Marland Kitchen.. 1po once daily 8)  Voltaren 1 % Gel (Diclofenac sodium) .... Apply three times a day - qid 9)  Benicar Hct 40-25 Mg Tabs (Olmesartan medoxomil-hctz) .Marland Kitchen.. 1 by mouth once daily 10)  Ibuprofen 400 Mg Tabs (Ibuprofen) .Marland Kitchen.. 1po q 6 hrs as needed 11)  Azithromycin 250 Mg Tabs (Azithromycin) .... 2po qd for 1 day, then 1po qd for 4days, then stop 12)  Metformin Hcl 500 Mg Tabs (Metformin hcl) .Marland Kitchen.. 1po once daily 13)  Cortisporin 3.5-10000-1 Soln  (Neomycin-polymyxin-hc) .... 2 gtts right ear four times per day  for 10 days 14)  Alprazolam 0.25 Mg Tabs (Alprazolam) .Marland Kitchen.. 1 by mouth two times a day as needed 15)  Zolpidem Tartrate 10 Mg Tabs (Zolpidem tartrate) .Marland Kitchen.. 1 by mouth at bedtime as needed sleep 16)  Hydrocodone-homatropine 5-1.5 Mg/48ml Syrp (Hydrocodone-homatropine) .Marland Kitchen.. 1 tsp by mouth q 6 hrs as needed 17)  Prednisone 10 Mg Tabs (Prednisone) .... 3po qd for 3days, then 2po qd for 3days, then 1po qd for 3days, then stop 18)  Ciprofloxacin Hcl 500 Mg Tabs (Ciprofloxacin hcl) .Marland Kitchen.. 1 by mouth  two times a day  Patient Instructions: 1)  you are given some samples today 2)  You had the blood work this morning - Please call the number on the [facility] Card for results of your testing 3)  Please also go for the urine test in the basement today 4)  .Please take all new medications as prescribed - the antibiotic 5)  Continue all previous medications as before this visit  6)  We will call to change your Diabetes medicines if needed 7)  Please schedule a follow-up appointment in 6 months with CPX labs and : 8)  HbgA1C prior to visit, ICD-9: 250.02 9)  Urine Microalbumin prior to visit, ICD-9: 10)  Check your Blood Pressure regularly. If it is above 130/85: you should make an appointment sooner Prescriptions: SINGULAIR 10 MG  TABS (MONTELUKAST SODIUM) Take 1 tablet by mouth once a day  #90 x 3   Entered and Authorized by:   Corwin Levins MD   Signed by:   Corwin Levins MD on 03/06/2010   Method used:   Print then Give to Patient   RxID:   [id number] OMEPRAZOLE 20 MG CPDR (OMEPRAZOLE) Take 1 capsule by mouth twice a day  #90 x 3   Entered and Authorized by:   Corwin Levins MD   Signed by:   Corwin Levins MD on 03/06/2010   Method used:   Print then Give to Patient   RxID:   [id number] CIPROFLOXACIN HCL 500 MG TABS (CIPROFLOXACIN HCL) 1 by mouth  two times a day  #20 x 0   Entered and Authorized by:   Corwin Levins MD   Signed by:    Corwin Levins MD on 03/06/2010   Method used:   Print then Give to Patient   RxID:   [id number]

## 2010-08-22 NOTE — Assessment & Plan Note (Signed)
Summary: 6 mos f/u $50 /cd   Vital Signs:  Patient profile:   60 year old female Height:      63 inches Weight:      168 pounds BMI:     29.87 O2 Sat:      98 % on Room air Temp:     97.7 degrees F oral Pulse rate:   70 / minute BP sitting:   146 / 82  (left arm) Cuff size:   regular  Vitals Entered ByZella Ball Ewing (September 05, 2009 10:24 AM)  O2 Flow:  Room air  CC: 6 Mo ROV/RE   CC:  6 Mo ROV/RE.  History of Present Illness: oldest son is in [place] hosp with termianl cancer, actively dying at this time.  overall o/w doing well with sense of fatigue in general, denies worsenign depressive symtpoms, suicidal ideation, incrased panic or anxiety;  hospice not yet started but will do counseling when available;  Pt denies CP, sob, doe, wheezing, orthopnea, pnd, worsening LE edema, palps, dizziness or syncope  Pt denies new neuro symptoms such as headache, facial or extremity weakness   Problems Prior to Update: 1)  Fatigue  (ICD-780.79) 2)  Sinusitis- Acute-nos  (ICD-461.9) 3)  Otitis Externa, Right  (ICD-380.10) 4)  Chest Pain  (ICD-786.50) 5)  Diabetes Mellitus, Type II  (ICD-250.00) 6)  Urinary Frequency  (ICD-788.41) 7)  Skin Lesion  (ICD-709.9) 8)  Gout  (ICD-274.9) 9)  Preventive Health Care  (ICD-V70.0) 10)  Knee Pain, Left  (ICD-719.46) 11)  Gerd  (ICD-530.81) 12)  Anemia-nos  (ICD-285.9) 13)  Asthma  (ICD-493.90) 14)  Alopecia  (ICD-704.00) 15)  Knee Pain, Right  (ICD-719.46) 16)  Cold Sore  (ICD-054.9) 17)  Hyperlipidemia  (ICD-272.4) 18)  Other Abnormal Glucose  (ICD-790.29) 19)  Hypertension  (ICD-401.9) 20)  Colonic Polyps, Hx of  (ICD-V12.72) 21)  Insomnia Nos  (ICD-780.52) 22)  Hypertension, Benign Systemic  (ICD-401.1) 23)  Goiter Nos  (ICD-240.9) 24)  Eczema, Atopic Dermatitis  (ICD-691.8) 25)  Asthma, Unspecified  (ICD-493.90)  Medications Prior to Update: 1)  Bystolic 10 Mg  Tabs (Nebivolol Hcl) .... One By Mouth Qd 2)  Omeprazole 20 Mg Cpdr  (Omeprazole) .... Take 1 Capsule By Mouth Twice A Day 3)  Qvar 80 Mcg/act  Aers (Beclomethasone Dipropionate) .... Two Times A Day 4)  Albuterol 90 Mcg/act  Aers (Albuterol) .... As Needed 5)  Singulair 10 Mg  Tabs (Montelukast Sodium) .... Take 1 Tablet By Mouth Once A Day 6)  Norvasc 10 Mg  Tabs (Amlodipine Besylate) .... Take 1 Tablet By Mouth Once A Day 7)  Simvastatin 40 Mg Tabs (Simvastatin) .Marland Kitchen.. 1po Once Daily 8)  Voltaren 1 %  Gel (Diclofenac Sodium) .... Apply Three Times A Day - Qid 9)  Benicar Hct 40-25 Mg Tabs (Olmesartan Medoxomil-Hctz) .Marland Kitchen.. 1 By Mouth Once Daily 10)  Ibuprofen 400 Mg Tabs (Ibuprofen) .Marland Kitchen.. 1po Q 6 Hrs As Needed 11)  Azithromycin 250 Mg Tabs (Azithromycin) .... 2po Qd For 1 Day, Then 1po Qd For 4days, Then Stop 12)  Metformin Hcl 500 Mg Tabs (Metformin Hcl) .Marland Kitchen.. 1po Once Daily 13)  Cortisporin 3.5-10000-1 Soln (Neomycin-Polymyxin-Hc) .... 2 Gtts Right Ear Four Times Per Day For 10 Days  Current Medications (verified): 1)  Bystolic 10 Mg  Tabs (Nebivolol Hcl) .... One By Mouth Qd 2)  Omeprazole 20 Mg Cpdr (Omeprazole) .... Take 1 Capsule By Mouth Twice A Day 3)  Qvar 80 Mcg/act  Aers (Beclomethasone Dipropionate) .Marland KitchenMarland KitchenMarland Kitchen  Two Times A Day 4)  Albuterol 90 Mcg/act  Aers (Albuterol) .... As Needed 5)  Singulair 10 Mg  Tabs (Montelukast Sodium) .... Take 1 Tablet By Mouth Once A Day 6)  Norvasc 10 Mg  Tabs (Amlodipine Besylate) .... Take 1 Tablet By Mouth Once A Day 7)  Simvastatin 40 Mg Tabs (Simvastatin) .Marland Kitchen.. 1po Once Daily 8)  Voltaren 1 %  Gel (Diclofenac Sodium) .... Apply Three Times A Day - Qid 9)  Benicar Hct 40-25 Mg Tabs (Olmesartan Medoxomil-Hctz) .Marland Kitchen.. 1 By Mouth Once Daily 10)  Ibuprofen 400 Mg Tabs (Ibuprofen) .Marland Kitchen.. 1po Q 6 Hrs As Needed 11)  Azithromycin 250 Mg Tabs (Azithromycin) .... 2po Qd For 1 Day, Then 1po Qd For 4days, Then Stop 12)  Metformin Hcl 500 Mg Tabs (Metformin Hcl) .Marland Kitchen.. 1po Once Daily 13)  Cortisporin 3.5-10000-1 Soln (Neomycin-Polymyxin-Hc)  .... 2 Gtts Right Ear Four Times Per Day For 10 Days  Allergies (verified): 1)  ! Ace Inhibitors 2)  ! Codeine  Past History:  Past Medical History: Last updated: 11/29/2008 L renal artery stenosis on angiogram 5/05- treated, morphea (dark skin lesions) 05/2002,  neurofibromatosis L renal art fibromucular dysplasia w/angioplasty w/good result by Dr. Fredia Sorrow x2 Toxic multinodular goiter 2/03-ablated 3/03,  Colonic polyps, hx of Hypertension Renal Artery Stenosis glucose intolerance Asthma DJD Anemia-NOS GERD Hyperlipidemia Gout Diabetes mellitus, type II  Past Surgical History: Last updated: 09/19/2006 Cholecystectomy - 09/21/2002, Echo 11/98 and 8/04 - wnl, EF 55-65% -, Hysterectomy & BSO - 10/22/1999, L RAS re-stented - 03/08/2005, L RAS tx`d w/ angioplasty - 11/21/2003, Left cataract surgery -, radioactive iodine ablate 3/03 thyroid - 10/07/2001, RAST 6/98 - neg -, Split sleep study neg for sleep apnea - 11/15/2003  Family History: Last updated: 08-19-2008 DM - mother father died with ? heart problem  Social History: Last updated: 08-19-08 Husband Areli Frary; two sons.  No tobacco, EtOH, drugs.  Works as a Engineering geologist  The patient denies anorexia, fever, weight loss, weight gain, vision loss, decreased hearing, hoarseness, chest pain, syncope, dyspnea on exertion, peripheral edema, prolonged cough, headaches, hemoptysis, abdominal pain, melena, hematochezia, severe indigestion/heartburn, hematuria, incontinence, muscle weakness, suspicious skin lesions, transient blindness, difficulty walking, depression, unusual weight change, abnormal bleeding, enlarged lymph nodes, and angioedema.         all otherwise negative per pt -  Physical Exam  General:  alert and overweight-appearing - mild  Head:  normocephalic and atraumatic.   Eyes:  vision grossly intact, pupils equal, and pupils round.   Ears:  R ear normal and L ear normal.   Nose:  no external  deformity and no nasal discharge.   Mouth:  no gingival abnormalities and pharynx pink and moist.   Neck:  supple and no masses.   Lungs:  normal respiratory effort and normal breath sounds.   Heart:  normal rate and regular rhythm.   Abdomen:  soft, non-tender, and normal bowel sounds.   Msk:  no joint tenderness and no joint swelling.   Extremities:  no edema, no erythema  Neurologic:  cranial nerves II-XII intact and strength normal in all extremities.     Impression & Recommendations:  Problem # 1:  Preventive Health Care (ICD-V70.0)  Overall doing well, age appropriate education and counseling updated and referral for appropriate preventive services done unless declined, immunizations up to date or declined, diet counseling done if overweight, urged to quit smoking if smokes , most recent labs reviewed and current ordered if  appropriate, ecg reviewed or declined (interpretation per ECG scanned in the EMR if done); information regarding Medicare Prevention requirements given if appropriate   Orders: TLB-BMP (Basic Metabolic Panel-BMET) (80048-METABOL) TLB-CBC Platelet - w/Differential (85025-CBCD) TLB-Hepatic/Liver Function Pnl (80076-HEPATIC) TLB-Lipid Panel (80061-LIPID) TLB-TSH (Thyroid Stimulating Hormone) (84443-TSH) TLB-Udip ONLY (81003-UDIP) T-Vitamin D (25-Hydroxy) (32355-73220)  Problem # 2:  COLONIC POLYPS, HX OF (ICD-V12.72)  ok to refer to [facility] per pt request for f/u colonoscopy  Orders: Gastroenterology Referral (GI)  Problem # 3:  FATIGUE (ICD-780.79) exam benign, to check labs below; follow with expectant management, likely due to son's illness  Orders: TLB-Sedimentation Rate (ESR) (85652-ESR) TLB-B12 + Folate Pnl (25427_06237-S28/BTD) TLB-IBC Pnl (Iron/FE;Transferrin) (83550-IBC) T-Vitamin D (25-Hydroxy) (17616-07371)  Problem # 4:  DIABETES MELLITUS, TYPE II (ICD-250.00)  Her updated medication list for this problem includes:    Benicar Hct 40-25 Mg  Tabs (Olmesartan medoxomil-hctz) .Marland Kitchen... 1 by mouth once daily    Metformin Hcl 500 Mg Tabs (Metformin hcl) .Marland Kitchen... 1po once daily  Labs Reviewed: Creat: 1.0 (04/11/2009)    Reviewed HgBA1c results: 5.8 (04/11/2009)  5.8 (08/02/2008) stable overall by hx and exam, ok to continue meds/tx as is   Orders: TLB-A1C / Hgb A1C (Glycohemoglobin) (83036-A1C) TLB-Microalbumin/Creat Ratio, Urine (82043-MALB)  Problem # 5:  GOUT (ICD-274.9) to check uric acid  Complete Medication List: 1)  Bystolic 10 Mg Tabs (Nebivolol hcl) .... One by mouth qd 2)  Omeprazole 20 Mg Cpdr (Omeprazole) .... Take 1 capsule by mouth twice a day 3)  Qvar 80 Mcg/act Aers (Beclomethasone dipropionate) .... Two times a day 4)  Albuterol 90 Mcg/act Aers (Albuterol) .... As needed 5)  Singulair 10 Mg Tabs (Montelukast sodium) .... Take 1 tablet by mouth once a day 6)  Norvasc 10 Mg Tabs (Amlodipine besylate) .... Take 1 tablet by mouth once a day 7)  Simvastatin 40 Mg Tabs (Simvastatin) .Marland Kitchen.. 1po once daily 8)  Voltaren 1 % Gel (Diclofenac sodium) .... Apply three times a day - qid 9)  Benicar Hct 40-25 Mg Tabs (Olmesartan medoxomil-hctz) .Marland Kitchen.. 1 by mouth once daily 10)  Ibuprofen 400 Mg Tabs (Ibuprofen) .Marland Kitchen.. 1po q 6 hrs as needed 11)  Azithromycin 250 Mg Tabs (Azithromycin) .... 2po qd for 1 day, then 1po qd for 4days, then stop 12)  Metformin Hcl 500 Mg Tabs (Metformin hcl) .Marland Kitchen.. 1po once daily 13)  Cortisporin 3.5-10000-1 Soln (Neomycin-polymyxin-hc) .... 2 gtts right ear four times per day for 10 days  Other Orders: TLB-Uric Acid, Blood (84550-URIC) Pneumococcal Vaccine (06269) Admin 1st Vaccine (48546)  Patient Instructions: 1)  Please go to the Lab in the basement for your blood and/or urine tests today 2)  You will be contacted about the referral(s) to: colonoscopy with [facility] 3)  you had the pneumonia shot today 4)  Please schedule a follow-up appointment in 6 months with : 5)  BMP prior to visit, ICD-9:  250.02 6)  Lipid Panel prior to visit, ICD-9: 7)  HbgA1C prior to visit, ICD-9:   Immunizations Administered:  Pneumonia Vaccine:    Vaccine Type: Pneumovax    Site: left deltoid    Mfr: Merck    Dose: 0.5 ml    Route: IM    Given by: Zella Ball Ewing    Exp. Date: 08/23/2010    Lot #: [id number]    VIS given: 02/18/96 version given September 05, 2009.

## 2010-08-22 NOTE — Progress Notes (Signed)
Summary: Rx req  Phone Note Call from Patient Call back at Home Phone [phone]   Caller: Patient Summary of Call: Pt called stating that she has been having pain and swelling in RT knee x 3 days. Pt is requestign Rx to treat, please advise? Initial call taken by: Margaret Pyle, CMA,  April 14, 2010 9:53 AM  Follow-up for Phone Call        ok for tramadol as needed, but can we refer to ortho as well (does she have an ortho MD?) Follow-up by: Corwin Levins MD,  April 14, 2010 1:06 PM  Additional Follow-up for Phone Call Additional follow up Details #1::        Pt informed and will follow up with her Ortho MD Additional Follow-up by: Margaret Pyle, CMA,  April 14, 2010 1:24 PM    New/Updated Medications: TRAMADOL HCL 50 MG TABS (TRAMADOL HCL) 1 by mouth q 6 hrs as needed pain Prescriptions: TRAMADOL HCL 50 MG TABS (TRAMADOL HCL) 1 by mouth q 6 hrs as needed pain  #40 x 1   Entered and Authorized by:   Corwin Levins MD   Signed by:   Corwin Levins MD on 04/14/2010   Method used:   Electronically to        [facility] [address] [phone]* (retail)       [address] [place], [place]  [address]       Ph: [phone]       Fax: [phone]   RxID:   [id number]

## 2010-08-22 NOTE — Assessment & Plan Note (Signed)
Summary: problems w/asthma/cd   Vital Signs:  Patient profile:   60 year old female Height:      63 inches Weight:      189.13 pounds BMI:     33.62 O2 Sat:      96 % on Room air Temp:     98.5 degrees F oral Pulse rate:   81 / minute BP sitting:   182 / 90  (left arm) Cuff size:   regular  Vitals Entered By: Zella Ball Ewing CMA Duncan Dull) (June 08, 2010 3:56 PM)  O2 Flow:  Room air CC: Asthma problems, SOB, abdominal pain/Re   CC:  Asthma problems, SOB, and abdominal pain/Re.  History of Present Illness: here to f/u with acute c/o acute onset 3 days fever, headache, general weakness and malaise, ST and prod cough greenish sputum worse in the past day with mild sob/doe and wheezing;  Pt denies CP, orthopnea, pnd, worsening LE edema, palps, dizziness or syncope  Pt denies new neuro symptoms such as headache, facial or extremity weakness  Pt adamant BP at home < 140/90.  Pt denies polydipsia, polyuria, or low sugar symptoms such as shakiness improved with eating.  Overall good compliance with meds, trying to follow low chol, DM diet, wt stable, little excercise however Overall good complaicne with meds, good tolerance.    Preventive Screening-Counseling & Management      Drug Use:  no.    Problems Prior to Update: 1)  Bronchitis-acute  (ICD-466.0) 2)  Frequency, Urinary  (ICD-788.41) 3)  Wheezing  (ICD-786.07) 4)  Fatigue  (ICD-780.79) 5)  Sinusitis- Acute-nos  (ICD-461.9) 6)  Otitis Externa, Right  (ICD-380.10) 7)  Chest Pain  (ICD-786.50) 8)  Diabetes Mellitus, Type II  (ICD-250.00) 9)  Urinary Frequency  (ICD-788.41) 10)  Skin Lesion  (ICD-709.9) 11)  Gout  (ICD-274.9) 12)  Preventive Health Care  (ICD-V70.0) 13)  Knee Pain, Left  (ICD-719.46) 14)  Gerd  (ICD-530.81) 15)  Anemia-nos  (ICD-285.9) 16)  Asthma  (ICD-493.90) 17)  Alopecia  (ICD-704.00) 18)  Knee Pain, Right  (ICD-719.46) 19)  Cold Sore  (ICD-054.9) 20)  Hyperlipidemia  (ICD-272.4) 21)  Other Abnormal  Glucose  (ICD-790.29) 22)  Hypertension  (ICD-401.9) 23)  Colonic Polyps, Hx of  (ICD-V12.72) 24)  Insomnia Nos  (ICD-780.52) 25)  Hypertension, Benign Systemic  (ICD-401.1) 26)  Goiter Nos  (ICD-240.9) 27)  Eczema, Atopic Dermatitis  (ICD-691.8) 28)  Asthma, Unspecified  (ICD-493.90)  Medications Prior to Update: 1)  Bystolic 10 Mg  Tabs (Nebivolol Hcl) .... One By Mouth Qd 2)  Omeprazole 20 Mg Cpdr (Omeprazole) .... Take 1 Capsule By Mouth Twice A Day 3)  Qvar 80 Mcg/act  Aers (Beclomethasone Dipropionate) .... Two Times A Day 4)  Albuterol 90 Mcg/act  Aers (Albuterol) .... As Needed 5)  Singulair 10 Mg  Tabs (Montelukast Sodium) .... Take 1 Tablet By Mouth Once A Day 6)  Norvasc 10 Mg  Tabs (Amlodipine Besylate) .... Take 1 Tablet By Mouth Once A Day 7)  Simvastatin 40 Mg Tabs (Simvastatin) .Marland Kitchen.. 1po Once Daily 8)  Voltaren 1 %  Gel (Diclofenac Sodium) .... Apply Three Times A Day - Qid 9)  Benicar Hct 40-25 Mg Tabs (Olmesartan Medoxomil-Hctz) .Marland Kitchen.. 1 By Mouth Once Daily 10)  Ibuprofen 400 Mg Tabs (Ibuprofen) .Marland Kitchen.. 1po Q 6 Hrs As Needed 11)  Metformin Hcl 500 Mg Tabs (Metformin Hcl) .Marland Kitchen.. 1po Once Daily 12)  Cortisporin 3.5-10000-1 Soln (Neomycin-Polymyxin-Hc) .... 2 Gtts Right Ear Four Times Per  Day For 10 Days 13)  Alprazolam 0.25 Mg Tabs (Alprazolam) .Marland Kitchen.. 1 By Mouth Two Times A Day As Needed 14)  Zolpidem Tartrate 10 Mg Tabs (Zolpidem Tartrate) .Marland Kitchen.. 1 By Mouth At Bedtime As Needed Sleep 15)  Tramadol Hcl 50 Mg Tabs (Tramadol Hcl) .Marland Kitchen.. 1 By Mouth Q 6 Hrs As Needed Pain  Current Medications (verified): 1)  Bystolic 10 Mg  Tabs (Nebivolol Hcl) .... One By Mouth Qd 2)  Omeprazole 20 Mg Cpdr (Omeprazole) .... Take 1 Capsule By Mouth Twice A Day 3)  Qvar 80 Mcg/act  Aers (Beclomethasone Dipropionate) .... Two Times A Day 4)  Albuterol 90 Mcg/act  Aers (Albuterol) .... As Needed 5)  Singulair 10 Mg  Tabs (Montelukast Sodium) .... Take 1 Tablet By Mouth Once A Day 6)  Norvasc 10 Mg  Tabs  (Amlodipine Besylate) .... Take 1 Tablet By Mouth Once A Day 7)  Simvastatin 40 Mg Tabs (Simvastatin) .Marland Kitchen.. 1po Once Daily 8)  Voltaren 1 %  Gel (Diclofenac Sodium) .... Apply Three Times A Day - Qid 9)  Benicar Hct 40-25 Mg Tabs (Olmesartan Medoxomil-Hctz) .Marland Kitchen.. 1 By Mouth Once Daily 10)  Ibuprofen 400 Mg Tabs (Ibuprofen) .Marland Kitchen.. 1po Q 6 Hrs As Needed 11)  Metformin Hcl 500 Mg Tabs (Metformin Hcl) .Marland Kitchen.. 1po Once Daily 12)  Cortisporin 3.5-10000-1 Soln (Neomycin-Polymyxin-Hc) .... 2 Gtts Right Ear Four Times Per Day For 10 Days 13)  Alprazolam 0.25 Mg Tabs (Alprazolam) .Marland Kitchen.. 1 By Mouth Two Times A Day As Needed 14)  Zolpidem Tartrate 10 Mg Tabs (Zolpidem Tartrate) .Marland Kitchen.. 1 By Mouth At Bedtime As Needed Sleep 15)  Tramadol Hcl 50 Mg Tabs (Tramadol Hcl) .Marland Kitchen.. 1 By Mouth Q 6 Hrs As Needed Pain 16)  Cephalexin 500 Mg Caps (Cephalexin) .Marland Kitchen.. 1po Three Times A Day 17)  Prednisone 10 Mg Tabs (Prednisone) .... 3po Qd For 3days, Then 2po Qd For 3days, Then 1po Qd For 3days, Then Stop 18)  Hydrocodone-Homatropine 5-1.5 Mg/27ml Syrp (Hydrocodone-Homatropine) .Marland Kitchen.. 1 Tsp By Mouth Q 6 Hrs As Needed Cough 19)  Meloxicam 15 Mg Tabs (Meloxicam) .Marland Kitchen.. 1po Once Daily  Allergies (verified): 1)  ! Ace Inhibitors 2)  ! Codeine  Past History:  Past Medical History: Last updated: 11/29/2008 L renal artery stenosis on angiogram 5/05- treated, morphea (dark skin lesions) 05/2002,  neurofibromatosis L renal art fibromucular dysplasia w/angioplasty w/good result by Dr. Fredia Sorrow x2 Toxic multinodular goiter 2/03-ablated 3/03,  Colonic polyps, hx of Hypertension Renal Artery Stenosis glucose intolerance Asthma DJD Anemia-NOS GERD Hyperlipidemia Gout Diabetes mellitus, type II  Past Surgical History: Last updated: 09/19/2006 Cholecystectomy - 09/21/2002, Echo 11/98 and 8/04 - wnl, EF 55-65% -, Hysterectomy & BSO - 10/22/1999, L RAS re-stented - 03/08/2005, L RAS tx`d w/ angioplasty - 11/21/2003, Left cataract surgery -,  radioactive iodine ablate 3/03 thyroid - 10/07/2001, RAST 6/98 - neg -, Split sleep study neg for sleep apnea - 11/15/2003  Social History: Last updated: 06/08/2010 Husband Hilda Blades; two sons.  No tobacco, EtOH, drugs.  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  No tobacco, EtOH, drugs.  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  , mild ill  Head:  normocephalic and atraumatic.   Eyes:  vision grossly intact, pupils equal, and pupils round.   Ears:  bilat  tms' midl erythema, sinus nontender Nose:  nasal dischargemucosal pallor and mucosal edema.   Mouth:  pharyngeal erythema and fair dentition.   Neck:  supple and no masses.   Lungs:  normal respiratory effort, R decreased breath sounds, R wheezes, L decreased breath sounds, and L wheezes.   Heart:  normal rate and regular rhythm.   Extremities:  no edema, no erythema    Impression & Recommendations:  Problem # 1:  BRONCHITIS-ACUTE (ICD-466.0)  Her updated medication list for this problem includes:    Qvar 80 Mcg/act Aers (Beclomethasone dipropionate) .Marland Kitchen..Marland Kitchen Two times a day    Albuterol 90 Mcg/act Aers (Albuterol) .Marland Kitchen... As needed    Singulair 10 Mg Tabs (Montelukast sodium) .Marland Kitchen... Take 1 tablet by mouth once a day    Cephalexin 500 Mg Caps (Cephalexin) .Marland Kitchen... 1po three times a day    Hydrocodone-homatropine 5-1.5 Mg/52ml Syrp (Hydrocodone-homatropine) .Marland Kitchen... 1 tsp by mouth q 6 hrs as needed cough treat as above, f/u any worsening signs or symptoms   Problem # 2:  WHEEZING (ICD-786.07)  liekly due to above - decliens cxr , for depomedrol IM x 1, and pred pack for home  Orders: Depo- Medrol 40mg  (J1030) Depo- Medrol 80mg  (J1040) Admin of Therapeutic Inj  intramuscular or subcutaneous (59563)  Problem # 3:  HYPERTENSION (ICD-401.9)  Her updated medication list for  this problem includes:    Bystolic 10 Mg Tabs (Nebivolol hcl) ..... One by mouth qd    Norvasc 10 Mg Tabs (Amlodipine besylate) .Marland Kitchen... Take 1 tablet by mouth once a day    Benicar Hct 40-25 Mg Tabs (Olmesartan medoxomil-hctz) .Marland Kitchen... 1 by mouth once daily mild elev today, likely situational, ok to follow, continue same treatment   BP today: 182/90 Prior BP: 142/88 (03/06/2010)  Labs Reviewed: K+: 4.5 (03/06/2010) Creat: : 1.0 (03/06/2010)   Chol: 157 (03/06/2010)   HDL: 50.70 (03/06/2010)   LDL: 94 (03/06/2010)   TG: 62.0 (03/06/2010) pt adamant BP at home normal,  decliens med change today, exam benign, ok to follow, cont to monitor BP at home and next visit  Problem # 4:  DIABETES MELLITUS, TYPE II (ICD-250.00)  Her updated medication list for this problem includes:    Benicar Hct 40-25 Mg Tabs (Olmesartan medoxomil-hctz) .Marland Kitchen... 1 by mouth once daily    Metformin Hcl 500 Mg Tabs (Metformin hcl) .Marland Kitchen... 1po once daily  Labs Reviewed: Creat: 1.0 (03/06/2010)    Reviewed HgBA1c results: 6.0 (03/06/2010)  5.8 (09/05/2009) stable overall by hx and exam, ok to continue meds/tx as is   Complete Medication List: 1)  Bystolic 10 Mg Tabs (Nebivolol hcl) .... One by mouth qd 2)  Omeprazole 20 Mg Cpdr (Omeprazole) .... Take 1 capsule by mouth twice a day 3)  Qvar 80 Mcg/act Aers (Beclomethasone dipropionate) .... Two times a day 4)  Albuterol 90 Mcg/act Aers (Albuterol) .... As needed 5)  Singulair 10 Mg Tabs (Montelukast sodium) .... Take 1 tablet by mouth once a day 6)  Norvasc 10 Mg Tabs (Amlodipine besylate) .... Take 1 tablet by mouth once a day 7)  Simvastatin 40 Mg Tabs (Simvastatin) .Marland Kitchen.. 1po once daily 8)  Voltaren 1 % Gel (Diclofenac sodium) .... Apply three times a day - qid 9)  Benicar Hct 40-25 Mg Tabs (Olmesartan medoxomil-hctz) .Marland Kitchen.. 1 by mouth once daily 10)  Ibuprofen 400 Mg Tabs (Ibuprofen) .Marland Kitchen.. 1po q 6 hrs as needed 11)  Metformin Hcl 500 Mg Tabs (Metformin hcl) .Marland Kitchen.. 1po once  daily 12)  Cortisporin 3.5-10000-1 Soln (Neomycin-polymyxin-hc) .... 2 gtts right ear four times per day for 10 days 13)  Alprazolam 0.25 Mg Tabs (Alprazolam) .Marland Kitchen.. 1 by mouth two times a day as needed 14)  Zolpidem Tartrate 10 Mg Tabs (Zolpidem tartrate) .Marland Kitchen.. 1 by mouth at bedtime as needed sleep 15)  Tramadol Hcl 50 Mg Tabs (Tramadol hcl) .Marland Kitchen.. 1 by mouth q 6 hrs as needed pain 16)  Cephalexin 500 Mg Caps (Cephalexin) .Marland Kitchen.. 1po three times a day 17)  Prednisone 10 Mg Tabs (Prednisone) .... 3po qd for 3days, then 2po qd for 3days, then 1po qd for 3days, then stop 18)  Hydrocodone-homatropine 5-1.5 Mg/68ml Syrp (Hydrocodone-homatropine) .Marland Kitchen.. 1 tsp by mouth q 6 hrs as needed cough 19)  Meloxicam 15 Mg Tabs (Meloxicam) .Marland Kitchen.. 1po once daily  Patient Instructions: 1)  you had the steroid shot today 2)  Please take all new medications as prescribed (see the new medications below) 3)  Continue all previous medications as before this visit  4)  Please schedule a follow-up appointment in 3 months. Prescriptions: HYDROCODONE-HOMATROPINE 5-1.5 MG/5ML SYRP (HYDROCODONE-HOMATROPINE) 1 tsp by mouth q 6 hrs as needed cough  #6 oz x 1   Entered and Authorized by:   Margaret Pyle, CMA   Signed by:   Margaret Pyle, CMA on 06/08/2010   Method used:   Telephoned to ...       [facility] [address] [phone]* (retail)       [address] [place], [place]  [address]       Ph: [phone]       Fax: [phone]   RxID:   [id number] HYDROCODONE-HOMATROPINE 5-1.5 MG/5ML SYRP (HYDROCODONE-HOMATROPINE) 1 tsp by mouth q 6 hrs as needed cough  #6 oz x 1   Entered and Authorized by:   Corwin Levins MD   Signed by:   Corwin Levins MD on 06/08/2010   Method used:   Print then Give to Patient   RxID:   [id number] PREDNISONE 10 MG TABS (PREDNISONE) 3po qd for 3days, then 2po qd for 3days, then 1po qd for 3days, then stop  #18 x 0   Entered and Authorized by:   Corwin Levins MD   Signed  by:   Corwin Levins MD on 06/08/2010   Method used:   Electronically to        [facility] [address] [phone]* (retail)       [address] [place], [place]  [address]       Ph: [phone]       Fax: [phone]   RxID:   [id number] CEPHALEXIN 500 MG CAPS (CEPHALEXIN) 1po three times a day  #30 x 0   Entered and Authorized by:   Corwin Levins MD   Signed by:   Corwin Levins MD on 06/08/2010   Method used:   Electronically to        [facility] [address] [phone]* (retail)       [address] [place], [place]  [address]       Ph: [phone]       Fax: [phone]   RxID:   [id number]    Medication Administration  Injection # 1:    Medication: Depo- Medrol 40mg     Diagnosis: WHEEZING (ICD-786.07)    Route: IM    Site: RUOQ gluteus    Exp  Date: 10/2012    Lot #: [id number]    Mfr: [employer]    Comments: Patient received 120mg  Depo-Medrol    Patient tolerated injection without complications    Given by: Zella Ball Ewing CMA Duncan Dull) (June 08, 2010 4:28 PM)  Injection # 2:    Medication: Depo- Medrol 80mg     Diagnosis: WHEEZING (ICD-786.07)    Route: IM    Site: RUOQ gluteus    Exp Date: 10/2012    Lot #: [id number]    Mfr: [employer]    Given by: Zella Ball Ewing CMA Duncan Dull) (June 08, 2010 4:28 PM)  Orders Added: 1)  Depo- Medrol 40mg  [J1030] 2)  Depo- Medrol 80mg  [J1040] 3)  Admin of Therapeutic Inj  intramuscular or subcutaneous [96372] 4)  Est. Patient Level IV [60454]  Appended Document: problems w/asthma/cd robin to call pt;  further med review shows due to recent FDA recommendations, she should decrease the simvastatin to 20 mg since she is also on the norvasc  Appended Document: problems w/asthma/cd called pt informed of above information.

## 2010-08-22 NOTE — Letter (Signed)
Summary: Generic Letter  [facility]  [address] [place], [place] [address]   Phone: [phone]  Fax: [phone]    08/02/2009  Brooke [name] [address] [place], [place]  [address] DOB:   12-Nov-1950  Dear [name],       This letter is in support of your application for [employer]  transportation.  This should be granted due to your Severe,  Permanent condition of Neurofibromatosis and leg swelling  which do not allow you to ride [place] regular bus [place].  The  ability to walk and ride the regular bus system is not possible  due to this, due to weakness and pain.      Sincerely,   Oliver Barre MD

## 2010-08-22 NOTE — Assessment & Plan Note (Signed)
Summary: SORE THROAT--STOMACH SORENESS--STC   Vital Signs:  Patient profile:   60 year old female Height:      63 inches Weight:      176 pounds BMI:     31.29 O2 Sat:      97 % on Room air Temp:     99 degrees F oral Pulse rate:   85 / minute BP sitting:   142 / 80  (left arm) Cuff size:   regular  Vitals Entered ByMarland Kitchen Zella Ball Ewing (December 30, 2009 4:32 PM)  O2 Flow:  Room air CC: sore throat, congestion, ear pain/RE   CC:  sore throat, congestion, and ear pain/RE.  History of Present Illness: here with acute onset mild to mod x 3 days fever, ST, prod cough greenish sputum, an mild onset wheezing and sob today, but Pt denies CP,orthopnea, pnd, worsening LE edema, palps, dizziness or syncope .  Pt denies new neuro symptoms such as headache, facial or extremity weakness   Overall good compliance with meds, good tolerability.   Problems Prior to Update: 1)  Wheezing  (ICD-786.07) 2)  Bronchitis-acute  (ICD-466.0) 3)  Fatigue  (ICD-780.79) 4)  Sinusitis- Acute-nos  (ICD-461.9) 5)  Otitis Externa, Right  (ICD-380.10) 6)  Chest Pain  (ICD-786.50) 7)  Diabetes Mellitus, Type II  (ICD-250.00) 8)  Urinary Frequency  (ICD-788.41) 9)  Skin Lesion  (ICD-709.9) 10)  Gout  (ICD-274.9) 11)  Preventive Health Care  (ICD-V70.0) 12)  Knee Pain, Left  (ICD-719.46) 13)  Gerd  (ICD-530.81) 14)  Anemia-nos  (ICD-285.9) 15)  Asthma  (ICD-493.90) 16)  Alopecia  (ICD-704.00) 17)  Knee Pain, Right  (ICD-719.46) 18)  Cold Sore  (ICD-054.9) 19)  Hyperlipidemia  (ICD-272.4) 20)  Other Abnormal Glucose  (ICD-790.29) 21)  Hypertension  (ICD-401.9) 22)  Colonic Polyps, Hx of  (ICD-V12.72) 23)  Insomnia Nos  (ICD-780.52) 24)  Hypertension, Benign Systemic  (ICD-401.1) 25)  Goiter Nos  (ICD-240.9) 26)  Eczema, Atopic Dermatitis  (ICD-691.8) 27)  Asthma, Unspecified  (ICD-493.90)  Medications Prior to Update: 1)  Bystolic 10 Mg  Tabs (Nebivolol Hcl) .... One By Mouth Qd 2)  Omeprazole 20 Mg Cpdr  (Omeprazole) .... Take 1 Capsule By Mouth Twice A Day 3)  Qvar 80 Mcg/act  Aers (Beclomethasone Dipropionate) .... Two Times A Day 4)  Albuterol 90 Mcg/act  Aers (Albuterol) .... As Needed 5)  Singulair 10 Mg  Tabs (Montelukast Sodium) .... Take 1 Tablet By Mouth Once A Day 6)  Norvasc 10 Mg  Tabs (Amlodipine Besylate) .... Take 1 Tablet By Mouth Once A Day 7)  Simvastatin 40 Mg Tabs (Simvastatin) .Marland Kitchen.. 1po Once Daily 8)  Voltaren 1 %  Gel (Diclofenac Sodium) .... Apply Three Times A Day - Qid 9)  Benicar Hct 40-25 Mg Tabs (Olmesartan Medoxomil-Hctz) .Marland Kitchen.. 1 By Mouth Once Daily 10)  Ibuprofen 400 Mg Tabs (Ibuprofen) .Marland Kitchen.. 1po Q 6 Hrs As Needed 11)  Azithromycin 250 Mg Tabs (Azithromycin) .... 2po Qd For 1 Day, Then 1po Qd For 4days, Then Stop 12)  Metformin Hcl 500 Mg Tabs (Metformin Hcl) .Marland Kitchen.. 1po Once Daily 13)  Cortisporin 3.5-10000-1 Soln (Neomycin-Polymyxin-Hc) .... 2 Gtts Right Ear Four Times Per Day For 10 Days 14)  Alprazolam 0.25 Mg Tabs (Alprazolam) .Marland Kitchen.. 1 By Mouth Two Times A Day As Needed 15)  Zolpidem Tartrate 10 Mg Tabs (Zolpidem Tartrate) .Marland Kitchen.. 1 By Mouth At Bedtime As Needed Sleep  Current Medications (verified): 1)  Bystolic 10 Mg  Tabs (Nebivolol Hcl) .Marland KitchenMarland KitchenMarland Kitchen  One By Mouth Qd 2)  Omeprazole 20 Mg Cpdr (Omeprazole) .... Take 1 Capsule By Mouth Twice A Day 3)  Qvar 80 Mcg/act  Aers (Beclomethasone Dipropionate) .... Two Times A Day 4)  Albuterol 90 Mcg/act  Aers (Albuterol) .... As Needed 5)  Singulair 10 Mg  Tabs (Montelukast Sodium) .... Take 1 Tablet By Mouth Once A Day 6)  Norvasc 10 Mg  Tabs (Amlodipine Besylate) .... Take 1 Tablet By Mouth Once A Day 7)  Simvastatin 40 Mg Tabs (Simvastatin) .Marland Kitchen.. 1po Once Daily 8)  Voltaren 1 %  Gel (Diclofenac Sodium) .... Apply Three Times A Day - Qid 9)  Benicar Hct 40-25 Mg Tabs (Olmesartan Medoxomil-Hctz) .Marland Kitchen.. 1 By Mouth Once Daily 10)  Ibuprofen 400 Mg Tabs (Ibuprofen) .Marland Kitchen.. 1po Q 6 Hrs As Needed 11)  Azithromycin 250 Mg Tabs (Azithromycin)  .... 2po Qd For 1 Day, Then 1po Qd For 4days, Then Stop 12)  Metformin Hcl 500 Mg Tabs (Metformin Hcl) .Marland Kitchen.. 1po Once Daily 13)  Cortisporin 3.5-10000-1 Soln (Neomycin-Polymyxin-Hc) .... 2 Gtts Right Ear Four Times Per Day For 10 Days 14)  Alprazolam 0.25 Mg Tabs (Alprazolam) .Marland Kitchen.. 1 By Mouth Two Times A Day As Needed 15)  Zolpidem Tartrate 10 Mg Tabs (Zolpidem Tartrate) .Marland Kitchen.. 1 By Mouth At Bedtime As Needed Sleep 16)  Hydrocodone-Homatropine 5-1.5 Mg/12ml Syrp (Hydrocodone-Homatropine) .Marland Kitchen.. 1 Tsp By Mouth Q 6 Hrs As Needed 17)  Prednisone 10 Mg Tabs (Prednisone) .... 3po Qd For 3days, Then 2po Qd For 3days, Then 1po Qd For 3days, Then Stop  Allergies (verified): 1)  ! Ace Inhibitors 2)  ! Codeine  Past History:  Past Medical History: Last updated: 11/29/2008 L renal artery stenosis on angiogram 5/05- treated, morphea (dark skin lesions) 05/2002,  neurofibromatosis L renal art fibromucular dysplasia w/angioplasty w/good result by Dr. Fredia Sorrow x2 Toxic multinodular goiter 2/03-ablated 3/03,  Colonic polyps, hx of Hypertension Renal Artery Stenosis glucose intolerance Asthma DJD Anemia-NOS GERD Hyperlipidemia Gout Diabetes mellitus, type II  Past Surgical History: Last updated: 09/19/2006 Cholecystectomy - 09/21/2002, Echo 11/98 and 8/04 - wnl, EF 55-65% -, Hysterectomy & BSO - 10/22/1999, L RAS re-stented - 03/08/2005, L RAS tx`d w/ angioplasty - 11/21/2003, Left cataract surgery -, radioactive iodine ablate 3/03 thyroid - 10/07/2001, RAST 6/98 - neg -, Split sleep study neg for sleep apnea - 11/15/2003  Social History: Last updated: 08/09/2008 Husband Hilda Blades; two sons.  No tobacco, EtOH, drugs.  Works as a Engineering geologist       all otherwise negative per pt -    Physical Exam  General:  alert and overweight-appearing.  , mild ill  Head:  normocephalic and atraumatic.   Eyes:  vision grossly intact, pupils equal, and pupils round.   Ears:  bilat tm's red, sinus  nontender Nose:  nasal dischargemucosal pallor and mucosal edema.   Mouth:  pharyngeal erythema and fair dentition.   Neck:  supple and no masses.   Lungs:  normal respiratory effort, R decreased breath sounds, R wheezes, L decreased breath sounds, and L wheezes - mild wheezes overall only Heart:  normal rate and regular rhythm.   Extremities:  no edema, no erythema    Impression & Recommendations:  Problem # 1:  BRONCHITIS-ACUTE (ICD-466.0)  Her updated medication list for this problem includes:    Qvar 80 Mcg/act Aers (Beclomethasone dipropionate) .Marland Kitchen..Marland Kitchen Two times a day    Albuterol 90 Mcg/act Aers (Albuterol) .Marland Kitchen... As needed    Singulair 10 Mg  Tabs (Montelukast sodium) .Marland Kitchen... Take 1 tablet by mouth once a day    Azithromycin 250 Mg Tabs (Azithromycin) .Marland Kitchen... 2po qd for 1 day, then 1po qd for 4days, then stop    Hydrocodone-homatropine 5-1.5 Mg/1ml Syrp (Hydrocodone-homatropine) .Marland Kitchen... 1 tsp by mouth q 6 hrs as needed treat as above, f/u any worsening signs or symptoms   Problem # 2:  WHEEZING (ICD-786.07) mild, likely due to above, for prednisone pack asd  Problem # 3:  DIABETES MELLITUS, TYPE II (ICD-250.00)  Her updated medication list for this problem includes:    Benicar Hct 40-25 Mg Tabs (Olmesartan medoxomil-hctz) .Marland Kitchen... 1 by mouth once daily    Metformin Hcl 500 Mg Tabs (Metformin hcl) .Marland Kitchen... 1po once daily  Labs Reviewed: Creat: 1.0 (09/05/2009)    Reviewed HgBA1c results: 5.8 (09/05/2009)  5.8 (04/11/2009) stable overall by hx and exam, ok to continue meds/tx as is   Problem # 4:  HYPERTENSION (ICD-401.9)  Her updated medication list for this problem includes:    Bystolic 10 Mg Tabs (Nebivolol hcl) ..... One by mouth qd    Norvasc 10 Mg Tabs (Amlodipine besylate) .Marland Kitchen... Take 1 tablet by mouth once a day    Benicar Hct 40-25 Mg Tabs (Olmesartan medoxomil-hctz) .Marland Kitchen... 1 by mouth once daily  BP today: 142/80 Prior BP: 146/82 (09/05/2009)  Labs Reviewed: K+: 3.9  (09/05/2009) Creat: : 1.0 (09/05/2009)   Chol: 214 (09/05/2009)   HDL: 60.10 (09/05/2009)   LDL: 107 (04/11/2009)   TG: 131.0 (09/05/2009) mild elev today, likely situational, ok to follow, continue same treatment   Complete Medication List: 1)  Bystolic 10 Mg Tabs (Nebivolol hcl) .... One by mouth qd 2)  Omeprazole 20 Mg Cpdr (Omeprazole) .... Take 1 capsule by mouth twice a day 3)  Qvar 80 Mcg/act Aers (Beclomethasone dipropionate) .... Two times a day 4)  Albuterol 90 Mcg/act Aers (Albuterol) .... As needed 5)  Singulair 10 Mg Tabs (Montelukast sodium) .... Take 1 tablet by mouth once a day 6)  Norvasc 10 Mg Tabs (Amlodipine besylate) .... Take 1 tablet by mouth once a day 7)  Simvastatin 40 Mg Tabs (Simvastatin) .Marland Kitchen.. 1po once daily 8)  Voltaren 1 % Gel (Diclofenac sodium) .... Apply three times a day - qid 9)  Benicar Hct 40-25 Mg Tabs (Olmesartan medoxomil-hctz) .Marland Kitchen.. 1 by mouth once daily 10)  Ibuprofen 400 Mg Tabs (Ibuprofen) .Marland Kitchen.. 1po q 6 hrs as needed 11)  Azithromycin 250 Mg Tabs (Azithromycin) .... 2po qd for 1 day, then 1po qd for 4days, then stop 12)  Metformin Hcl 500 Mg Tabs (Metformin hcl) .Marland Kitchen.. 1po once daily 13)  Cortisporin 3.5-10000-1 Soln (Neomycin-polymyxin-hc) .... 2 gtts right ear four times per day for 10 days 14)  Alprazolam 0.25 Mg Tabs (Alprazolam) .Marland Kitchen.. 1 by mouth two times a day as needed 15)  Zolpidem Tartrate 10 Mg Tabs (Zolpidem tartrate) .Marland Kitchen.. 1 by mouth at bedtime as needed sleep 16)  Hydrocodone-homatropine 5-1.5 Mg/34ml Syrp (Hydrocodone-homatropine) .Marland Kitchen.. 1 tsp by mouth q 6 hrs as needed 17)  Prednisone 10 Mg Tabs (Prednisone) .... 3po qd for 3days, then 2po qd for 3days, then 1po qd for 3days, then stop  Patient Instructions: 1)  Please take all new medications as prescribed  - the antibiotic, cough medicine, and low dose prednisone for the wheezing 2)  Continue all previous medications as before this visit  3)  You can also use Mucinex OTC or it's generic  for congestion  4)  You are given  the work note for tonight 5)  Please schedule a follow-up appointment in Feb 2012 with CPX labs and: 6)  HbgA1C prior to visit, ICD-9: 250.02 7)  Urine Microalbumin prior to visit, ICD-9: Prescriptions: PREDNISONE 10 MG TABS (PREDNISONE) 3po qd for 3days, then 2po qd for 3days, then 1po qd for 3days, then stop  #18 x 0   Entered and Authorized by:   Corwin Levins MD   Signed by:   Corwin Levins MD on 12/30/2009   Method used:   Print then Give to Patient   RxID:   [id number] HYDROCODONE-HOMATROPINE 5-1.5 MG/5ML SYRP (HYDROCODONE-HOMATROPINE) 1 tsp by mouth q 6 hrs as needed  #6oz x 1   Entered and Authorized by:   Corwin Levins MD   Signed by:   Corwin Levins MD on 12/30/2009   Method used:   Print then Give to Patient   RxID:   [id number] AZITHROMYCIN 250 MG TABS (AZITHROMYCIN) 2po qd for 1 day, then 1po qd for 4days, then stop  #6 x 1   Entered and Authorized by:   Corwin Levins MD   Signed by:   Corwin Levins MD on 12/30/2009   Method used:   Print then Give to Patient   RxID:   [id number]

## 2010-08-22 NOTE — Procedures (Signed)
Summary: Colonoscopy  Patient: [name] Note: All result statuses are Final unless otherwise noted.  Tests: (1) Colonoscopy (COL)   COL Colonoscopy           DONE     [facility]     [address]     [place], [place]  [address]           COLONOSCOPY PROCEDURE REPORT           PATIENT:  [name], [name]  MR#:  [id number]     BIRTHDATE:  1950/12/15, 58 yrs. old  GENDER:  female           ENDOSCOPIST:  Barbette Hair. Arlyce Dice, MD     Referred by:  Oliver Barre, M.D.           PROCEDURE DATE:  10/11/2009     PROCEDURE:  Colonoscopy with snare polypectomy     ASA CLASS:  Class II     INDICATIONS:  Routine Risk Screening           MEDICATIONS:   Fentanyl 50 mcg IV, Versed 7 mg IV           DESCRIPTION OF PROCEDURE:   After the risks benefits and     alternatives of the procedure were thoroughly explained, informed     consent was obtained.  Digital rectal exam was performed and     revealed no abnormalities.   The LB [id number] endoscope     was introduced through the anus and advanced to the cecum, which     was identified by both the appendix and ileocecal valve, without     limitations.  The quality of the prep was excellent, using     MoviPrep.  The instrument was then slowly withdrawn as the colon     was fully examined.     <<PROCEDUREIMAGES>>           FINDINGS:  A sessile polyp was found in the sigmoid colon. It was     3 mm in size. It was found 30 cm from the point of entry. Polyp     was snared without cautery. Retrieval was successful (see     image11). snare polyp  Moderate diverticulosis was found in the     sigmoid colon (see image1 and image12).  Scattered diverticula     were found ascending colon to sigmoid colon.  This was otherwise a     normal examination of the colon (see image3, image4, image5,     image7, image13, and image14).   Retroflexed views in the rectum     revealed no abnormalities.    The scope was then withdrawn from     the patient  and the procedure completed.           COMPLICATIONS:  None           ENDOSCOPIC IMPRESSION:     1) 3 mm sessile polyp in the sigmoid colon     2) Moderate diverticulosis in the sigmoid colon     3) Diverticula, scattered ascending colon to sigmoid colon     4) Otherwise normal examination     RECOMMENDATIONS:     1) If the polyp(s) removed today are proven to be adenomatous     (pre-cancerous) polyps, you will need a repeat colonoscopy in 5     years. Otherwise you should continue to follow colorectal cancer     screening guidelines for "routine risk" patients  with colonoscopy     in 10 years.           REPEAT EXAM:   You will receive a letter from Dr. Arlyce Dice in 1-2     weeks, after reviewing the final pathology, with followup     recommendations.           ______________________________     Barbette Hair Arlyce Dice, MD           CC:           n.     eSIGNED:   Barbette Hair. Skylynne Schlechter at 10/11/2009 09:10 AM           Born, [place]oppel, [id number]  Note: An exclamation mark (!) indicates a result that was not dispersed into the flowsheet. Document Creation Date: 10/11/2009 9:10 AM _______________________________________________________________________  (1) Order result status: Final Collection or observation date-time: 10/11/2009 09:01 Requested date-time:  Receipt date-time:  Reported date-time:  Referring Physician:   Ordering Physician: Melvia Heaps [phone]) Specimen Source:  Source: Launa Grill Order Number: [id number] Lab site:   Appended Document: Colonoscopy     Procedures Next Due Date:    Colonoscopy: 09/2019

## 2010-08-22 NOTE — Letter (Signed)
Summary: [facility] Instructions  [facility]  [address] [place], [place] [address]   Phone: [phone]  Fax: [phone]       Brooke [name]    1950-08-30    MRN: [id number]        Procedure Day Dorna Bloom:  Jake Shark  10/11/09       Arrival Time:  7:30AM     Procedure Time:  8:00AM     Location of Procedure:                    _ X_   Endoscopy Center (4th Floor)                        PREPARATION FOR COLONOSCOPY WITH MOVIPREP   Starting 5 days prior to your procedure 10/06/09 do not eat nuts, seeds, popcorn, corn, beans, peas,  salads, or any raw vegetables.  Do not take any fiber supplements (e.g. Metamucil, Citrucel, and Benefiber).  THE DAY BEFORE YOUR PROCEDURE         DATE: 10/10/09  DAY: MONDAY  1.  Drink clear liquids the entire day-NO SOLID FOOD  2.  Do not drink anything colored red or purple.  Avoid juices with pulp.  No orange juice.  3.  Drink at least 64 oz. (8 glasses) of fluid/clear liquids during the day to prevent dehydration and help the prep work efficiently.  CLEAR LIQUIDS INCLUDE: Water Jello Ice Popsicles Tea (sugar ok, no milk/cream) Powdered fruit flavored drinks Coffee (sugar ok, no milk/cream) Gatorade Juice: apple, white grape, white cranberry  Lemonade Clear bullion, consomm, broth Carbonated beverages (any kind) Strained chicken noodle soup Hard Candy                             4.  In the morning, mix first dose of MoviPrep solution:    Empty 1 Pouch A and 1 Pouch B into the disposable container    Add lukewarm drinking water to the top line of the container. Mix to dissolve    Refrigerate (mixed solution should be used within 24 hrs)  5.  Begin drinking the prep at 5:00 p.m. The MoviPrep container is divided by 4 marks.   Every 15 minutes drink the solution down to the next mark (approximately 8 oz) until the full liter is complete.   6.  Follow completed prep with 16 oz of clear liquid of your choice  (Nothing red or purple).  Continue to drink clear liquids until bedtime.  7.  Before going to bed, mix second dose of MoviPrep solution:    Empty 1 Pouch A and 1 Pouch B into the disposable container    Add lukewarm drinking water to the top line of the container. Mix to dissolve    Refrigerate  THE DAY OF YOUR PROCEDURE      DATE: 10/11/09 DAY: TUESDAY  Beginning at 3:00AM (5 hours before procedure):         1. Every 15 minutes, drink the solution down to the next mark (approx 8 oz) until the full liter is complete.  2. Follow completed prep with 16 oz. of clear liquid of your choice.    3. You may drink clear liquids until 6:00AM (2 HOURS BEFORE PROCEDURE).   MEDICATION INSTRUCTIONS  Unless otherwise instructed, you should take regular prescription medications with a small sip of water   as early as possible the  morning of your procedure.  Diabetic patients - see separate instructions.           OTHER INSTRUCTIONS  You will need a responsible adult at least 59 years of age to accompany you and drive you home.   This person must remain in the waiting room during your procedure.  Wear loose fitting clothing that is easily removed.  Leave jewelry and other valuables at home.  However, you may wish to bring a book to read or  an iPod/MP3 player to listen to music as you wait for your procedure to start.  Remove all body piercing jewelry and leave at home.  Total time from sign-in until discharge is approximately 2-3 hours.  You should go home directly after your procedure and rest.  You can resume normal activities the  day after your procedure.  The day of your procedure you should not:   Drive   Make legal decisions   Operate machinery   Drink alcohol   Return to work  You will receive specific instructions about eating, activities and medications before you leave.    The above instructions have been reviewed and explained to me by  Clide Cliff, RN  _______________________    I fully understand and can verbalize these instructions _____________________________ Date _________

## 2010-08-22 NOTE — Letter (Signed)
Summary: Previsit letter  [facility]  [address] [place], [place] [address]   Phone: [phone]  Fax: [phone]       09/06/2009 MRN: [id number]  [facility] [address] [place], [place]  [address]  Dear [name],  Welcome to the Gastroenterology Division at [facility].    You are scheduled to see a nurse for your pre-procedure visit on 09-27-09 at 10:00a.m. on the 3rd floor at [facility], [address]  We ask that you try to arrive at our office 15 minutes prior to your appointment time to allow for check-in.  Your nurse visit will consist of discussing your medical and surgical history, your immediate family medical history, and your medications.    Please bring a complete list of all your medications or, if you prefer, bring the medication bottles and we will list them.  We will need to be aware of both prescribed and over the counter drugs.  We will need to know exact dosage information as well.  If you are on blood thinners (Coumadin, Plavix, Aggrenox, Ticlid, etc.) please call our office today/prior to your appointment, as we need to consult with your physician about holding your medication.   Please be prepared to read and sign documents such as consent forms, a financial agreement, and acknowledgement forms.  If necessary, and with your consent, a friend or relative is welcome to sit-in on the nurse visit with you.  Please bring your insurance card so that we may make a copy of it.  If your insurance requires a referral to see a specialist, please bring your referral form from your primary care physician.  No co-pay is required for this nurse visit.     If you cannot keep your appointment, please call [phone] to cancel or reschedule prior to your appointment date.  This allows [place] the opportunity to schedule an appointment for another patient in need of care.    Thank you for choosing [facility] for your  medical needs.  We appreciate the opportunity to care for you.  Please visit [place] at our website  to learn more about our practice.                     Sincerely.                                                                                                                   The Gastroenterology Division

## 2010-08-22 NOTE — Letter (Signed)
Summary: Out of Work  [facility]  [address] [place], [place] [address]   Phone: [phone]  Fax: [phone]    December 30, 2009   Employee:  [employer]    To Whom It May Concern:   For Medical reasons, please excuse the above named employee from work for the following dates:  Start:   December 30, 2009  End:   December 30, 2009  If you need additional information, please feel free to contact our office.         Sincerely,    Dr. Oliver Barre

## 2010-08-22 NOTE — Letter (Signed)
Summary: Patient Notice- Colon Biospy Results  [facility]  [address] [place], [place] [address]   Phone: [phone]  Fax: [phone]        October 13, 2009 MRN: [id number]    [facility] [address] [place], [place]  [address]    Dear Ms. Heming,  I am pleased to inform you that the biopsies taken during your recent colonoscopy did not show any evidence of precancerous changes upon pathologic examination.  Additional information/recommendations:  __No further action is needed at this time.  Please follow-up with      your primary care physician for your other healthcare needs.  __Please call [phone] to schedule a return visit to review      your condition.  __Continue with the treatment plan as outlined on the day of your      exam.  __You should have a repeat colonoscopy examination for this problem           in _10 years.  Please call us if you are having persistent problems or have questions about your condition that have not been fully answered at this time.  Sincerely,  Louis Meckel MD   This letter has been electronically signed by your physician.  Appended Document: Patient Notice- Colon Biospy Results letter mailed 3.30.11

## 2010-08-22 NOTE — Miscellaneous (Signed)
Summary: direct colon hx of polyps ICD-V12.72  Clinical Lists Changes  Medications: Added new medication of MOVIPREP 100 GM  SOLR (PEG-KCL-NACL-NASULF-NA ASC-C) As directed - Signed Rx of MOVIPREP 100 GM  SOLR (PEG-KCL-NACL-NASULF-NA ASC-C) As directed;  #1 x 0;  Signed;  Entered by: Clide Cliff RN;  Authorized by: Louis Meckel MD;  Method used: Electronically to [facility] [phone]*, [address] [place], [place]  [address], Ph: [phone], Fax: [phone] Allergies: Changed allergy or adverse reaction from CODEINE to CODEINE Changed allergy or adverse reaction from ACE INHIBITORS to ACE INHIBITORS    Prescriptions: MOVIPREP 100 GM  SOLR (PEG-KCL-NACL-NASULF-NA ASC-C) As directed  #1 x 0   Entered by:   Clide Cliff RN   Authorized by:   Louis Meckel MD   Signed by:   Clide Cliff RN on 09/29/2009   Method used:   Electronically to        [facility] [phone]* (retail)       [address]       [place], [place]  [address]       Ph: [phone]       Fax: [phone]   RxID:   [id number]

## 2010-08-22 NOTE — Progress Notes (Signed)
Summary: Pt request  Phone Note Call from Patient Call back at Home Phone [phone]   Caller: Patient Summary of Call: pt called stating that her son died Oct 30, 2022. Pt is requesting Rx to help her with "nerves" and sleep.  Initial call taken by: Margaret Pyle, CMA,  November 07, 2009 9:58 AM  Follow-up for Phone Call        pt informed, Rx in cabiet for pick up Follow-up by: Margaret Pyle, CMA,  November 07, 2009 10:39 AM    New/Updated Medications: ALPRAZOLAM 0.25 MG TABS (ALPRAZOLAM) 1 by mouth two times a day as needed ZOLPIDEM TARTRATE 10 MG TABS (ZOLPIDEM TARTRATE) 1 by mouth at bedtime as needed sleep Prescriptions: ZOLPIDEM TARTRATE 10 MG TABS (ZOLPIDEM TARTRATE) 1 by mouth at bedtime as needed sleep  #30 x 0   Entered and Authorized by:   Corwin Levins MD   Signed by:   Corwin Levins MD on 11/07/2009   Method used:   Print then Give to Patient   RxID:   [id number] ALPRAZOLAM 0.25 MG TABS (ALPRAZOLAM) 1 by mouth two times a day as needed  #60 x 0   Entered and Authorized by:   Corwin Levins MD   Signed by:   Corwin Levins MD on 11/07/2009   Method used:   Print then Give to Patient   RxID:   [id number]  done hardcopy to LIM side B - dahlia  Corwin Levins MD  November 07, 2009 10:25 AM

## 2010-08-22 NOTE — Assessment & Plan Note (Signed)
Summary: flu shot-lb   Nurse Visit   Allergies: 1)  ! Ace Inhibitors 2)  ! Codeine  Orders Added: 1)  Admin 1st Vaccine [90471] 2)  Flu Vaccine 32yrs + [34742] Flu Vaccine Consent Questions     Do you have a history of severe allergic reactions to this vaccine? no    Any prior history of allergic reactions to egg and/or gelatin? no    Do you have a sensitivity to the preservative Thimersol? no    Do you have a past history of Guillan-Barre Syndrome? no    Do you currently have an acute febrile illness? no    Have you ever had a severe reaction to latex? no    Vaccine information given and explained to patient? yes    Are you currently pregnant? no    Lot Number:[id number]   Exp Date:01/20/2011   Site Given  Left Deltoid IM .lbflu

## 2010-08-24 NOTE — Letter (Signed)
Summary: [facility]   Imported By: Lester Smithers 08/04/2010 07:55:47  _____________________________________________________________________  External Attachment:    Type:   Image     Comment:   External Document

## 2010-08-24 NOTE — Assessment & Plan Note (Signed)
Summary: ER FU --[facility]C   Vital Signs:  Patient profile:   60 year old female Height:      63 inches Weight:      189.25 pounds BMI:     33.65 O2 Sat:      97 % on Room air Temp:     99 degrees F oral Pulse rate:   80 / minute BP sitting:   152 / 90  (left arm) Cuff size:   large  Vitals Entered By: Zella Ball Ewing CMA Duncan Dull) (July 10, 2010 11:08 AM)  O2 Flow:  Room air CC: ER Followup/RE   CC:  ER Followup/RE.  History of Present Illness: here to f/u;  overall doing ok;  Pt denies CP, worsening sob, doe, wheezing, orthopnea, pnd, worsening LE edema, palps, dizziness or syncope  Pt denies new neuro symptoms such as headache, facial or extremity weakness  Pt denies polydipsia, polyuria, or low sugar symptoms such as shakiness improved with eating.  Overall good compliance with meds, trying to follow low chol, DM diet, wt stable, little excercise however  CBG's in the 100's.  Denies worsening depressive symptoms, suicidal ideation, or panic.   No fever, wt loss, night sweats, loss of appetite or other constitutional symptoms  Overall good compliance with meds, and good tolerability.  Reflux well controlled on current PPI without sour brash, dysphaiga, abd pain, n/v, bowel change or blood.   Recently seen in ER with CP - resolved. BP at hoome has been < 140/90  Problems Prior to Update: 1)  Frequency, Urinary  (ICD-788.41) 2)  Wheezing  (ICD-786.07) 3)  Fatigue  (ICD-780.79) 4)  Sinusitis- Acute-nos  (ICD-461.9) 5)  Otitis Externa, Right  (ICD-380.10) 6)  Chest Pain  (ICD-786.50) 7)  Diabetes Mellitus, Type II  (ICD-250.00) 8)  Urinary Frequency  (ICD-788.41) 9)  Skin Lesion  (ICD-709.9) 10)  Gout  (ICD-274.9) 11)  Preventive Health Care  (ICD-V70.0) 12)  Knee Pain, Left  (ICD-719.46) 13)  Gerd  (ICD-530.81) 14)  Anemia-nos  (ICD-285.9) 15)  Asthma  (ICD-493.90) 16)  Alopecia  (ICD-704.00) 17)  Knee Pain, Right  (ICD-719.46) 18)  Cold Sore  (ICD-054.9) 19)  Hyperlipidemia   (ICD-272.4) 20)  Other Abnormal Glucose  (ICD-790.29) 21)  Hypertension  (ICD-401.9) 22)  Colonic Polyps, Hx of  (ICD-V12.72) 23)  Insomnia Nos  (ICD-780.52) 24)  Hypertension, Benign Systemic  (ICD-401.1) 25)  Goiter Nos  (ICD-240.9) 26)  Eczema, Atopic Dermatitis  (ICD-691.8) 27)  Asthma, Unspecified  (ICD-493.90)  Medications Prior to Update: 1)  Bystolic 10 Mg  Tabs (Nebivolol Hcl) .... One By Mouth Qd 2)  Omeprazole 20 Mg Cpdr (Omeprazole) .... Take 1 Capsule By Mouth Twice A Day 3)  Qvar 80 Mcg/act  Aers (Beclomethasone Dipropionate) .... Two Times A Day 4)  Albuterol 90 Mcg/act  Aers (Albuterol) .... As Needed 5)  Singulair 10 Mg  Tabs (Montelukast Sodium) .... Take 1 Tablet By Mouth Once A Day 6)  Norvasc 10 Mg  Tabs (Amlodipine Besylate) .... Take 1 Tablet By Mouth Once A Day 7)  Simvastatin 40 Mg Tabs (Simvastatin) .... 1/2 Po Once Daily 8)  Voltaren 1 %  Gel (Diclofenac Sodium) .... Apply Three Times A Day - Qid 9)  Benicar Hct 40-25 Mg Tabs (Olmesartan Medoxomil-Hctz) .Marland Kitchen.. 1 By Mouth Once Daily 10)  Ibuprofen 400 Mg Tabs (Ibuprofen) .Marland Kitchen.. 1po Q 6 Hrs As Needed 11)  Metformin Hcl 500 Mg Tabs (Metformin Hcl) .Marland Kitchen.. 1po Once Daily 12)  Cortisporin 3.5-10000-1 Soln (  Neomycin-Polymyxin-Hc) .... 2 Gtts Right Ear Four Times Per Day For 10 Days 13)  Alprazolam 0.25 Mg Tabs (Alprazolam) .Marland Kitchen.. 1 By Mouth Two Times A Day As Needed 14)  Zolpidem Tartrate 10 Mg Tabs (Zolpidem Tartrate) .Marland Kitchen.. 1 By Mouth At Bedtime As Needed Sleep 15)  Tramadol Hcl 50 Mg Tabs (Tramadol Hcl) .Marland Kitchen.. 1 By Mouth Q 6 Hrs As Needed Pain 16)  Cephalexin 500 Mg Caps (Cephalexin) .Marland Kitchen.. 1po Three Times A Day 17)  Prednisone 10 Mg Tabs (Prednisone) .... 3po Qd For 3days, Then 2po Qd For 3days, Then 1po Qd For 3days, Then Stop 18)  Hydrocodone-Homatropine 5-1.5 Mg/4ml Syrp (Hydrocodone-Homatropine) .Marland Kitchen.. 1 Tsp By Mouth Q 6 Hrs As Needed Cough 19)  Meloxicam 15 Mg Tabs (Meloxicam) .Marland Kitchen.. 1po Once Daily  Current Medications  (verified): 1)  Bystolic 10 Mg  Tabs (Nebivolol Hcl) .... One By Mouth Qd 2)  Omeprazole 20 Mg Cpdr (Omeprazole) .... Take 1 Capsule By Mouth Twice A Day 3)  Qvar 80 Mcg/act  Aers (Beclomethasone Dipropionate) .... Two Times A Day 4)  Albuterol 90 Mcg/act  Aers (Albuterol) .... As Needed 5)  Singulair 10 Mg  Tabs (Montelukast Sodium) .... Take 1 Tablet By Mouth Once A Day 6)  Norvasc 10 Mg  Tabs (Amlodipine Besylate) .... Take 1 Tablet By Mouth Once A Day 7)  Simvastatin 40 Mg Tabs (Simvastatin) .... 1/2 Po Once Daily 8)  Voltaren 1 %  Gel (Diclofenac Sodium) .... Apply Three Times A Day - Qid 9)  Benicar Hct 40-25 Mg Tabs (Olmesartan Medoxomil-Hctz) .Marland Kitchen.. 1 By Mouth Once Daily 10)  Ibuprofen 400 Mg Tabs (Ibuprofen) .Marland Kitchen.. 1po Q 6 Hrs As Needed 11)  Metformin Hcl 500 Mg Tabs (Metformin Hcl) .Marland Kitchen.. 1po Once Daily 12)  Cortisporin 3.5-10000-1 Soln (Neomycin-Polymyxin-Hc) .... 2 Gtts Right Ear Four Times Per Day For 10 Days 13)  Alprazolam 0.25 Mg Tabs (Alprazolam) .Marland Kitchen.. 1 By Mouth Two Times A Day As Needed 14)  Zolpidem Tartrate 10 Mg Tabs (Zolpidem Tartrate) .Marland Kitchen.. 1 By Mouth At Bedtime As Needed Sleep 15)  Tramadol Hcl 50 Mg Tabs (Tramadol Hcl) .Marland Kitchen.. 1 By Mouth Q 6 Hrs As Needed Pain 16)  Hydrocodone-Homatropine 5-1.5 Mg/5ml Syrp (Hydrocodone-Homatropine) .Marland Kitchen.. 1 Tsp By Mouth Q 6 Hrs As Needed Cough  Allergies (verified): 1)  ! Ace Inhibitors 2)  ! Codeine 3)  * Tussionex  Past History:  Past Medical History: Last updated: 11/29/2008 L renal artery stenosis on angiogram 5/05- treated, morphea (dark skin lesions) 05/2002,  neurofibromatosis L renal art fibromucular dysplasia w/angioplasty w/good result by Dr. Fredia Sorrow x2 Toxic multinodular goiter 2/03-ablated 3/03,  Colonic polyps, hx of Hypertension Renal Artery Stenosis glucose intolerance Asthma DJD Anemia-NOS GERD Hyperlipidemia Gout Diabetes mellitus, type II  Past Surgical History: Last updated: 09/19/2006 Cholecystectomy -  09/21/2002, Echo 11/98 and 8/04 - wnl, EF 55-65% -, Hysterectomy & BSO - 10/22/1999, L RAS re-stented - 03/08/2005, L RAS tx`d w/ angioplasty - 11/21/2003, Left cataract surgery -, radioactive iodine ablate 3/03 thyroid - 10/07/2001, RAST 6/98 - neg -, Split sleep study neg for sleep apnea - 11/15/2003  Social History: Last updated: 06/08/2010 Husband Hilda Blades; two sons.  No tobacco, EtOH, drugs.  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  Head:  normocephalic and atraumatic.   Eyes:  vision grossly intact, pupils equal, and pupils round.  Ears:  R ear normal and L ear normal.   Nose:  no external deformity and no nasal discharge.   Mouth:  no gingival abnormalities and pharynx pink and moist.   Neck:  supple and no masses.   Lungs:  normal respiratory effort and normal breath sounds.   Heart:  normal rate and regular rhythm.   Abdomen:  soft, non-tender, and normal bowel sounds.   Extremities:  no edema, no erythema    Impression & Recommendations:  Problem # 1:  DIABETES MELLITUS, TYPE II (ICD-250.00)  Her updated medication list for this problem includes:    Benicar Hct 40-25 Mg Tabs (Olmesartan medoxomil-hctz) .Marland Kitchen... 1 by mouth once daily    Metformin Hcl 500 Mg Tabs (Metformin hcl) .Marland Kitchen... 1po once daily  Labs Reviewed: Creat: 1.0 (03/06/2010)    Reviewed HgBA1c results: 6.0 (03/06/2010)  5.8 (09/05/2009) stable overall by hx and exam, ok to continue meds/tx as is   Problem # 2:  HYPERLIPIDEMIA (ICD-272.4)  Her updated medication list for this problem includes:    Simvastatin 40 Mg Tabs (Simvastatin) .Marland Kitchen... 1/2 po once daily  Labs Reviewed: SGOT: 17 (09/05/2009)   SGPT: 11 (09/05/2009)   HDL:50.70 (03/06/2010), 60.10 (09/05/2009)  LDL:94 (03/06/2010), 107 (32/35/5732)  Chol:157 (03/06/2010), 214 (09/05/2009)  Trig:62.0 (03/06/2010), 131.0 (09/05/2009) stable overall by  hx and exam, ok to continue meds/tx as is , Pt to continue diet efforts, good med tolerance;  - goal LDL less than 70   Problem # 3:  HYPERTENSION (ICD-401.9)  Her updated medication list for this problem includes:    Bystolic 10 Mg Tabs (Nebivolol hcl) ..... One by mouth qd    Norvasc 10 Mg Tabs (Amlodipine besylate) .Marland Kitchen... Take 1 tablet by mouth once a day    Benicar Hct 40-25 Mg Tabs (Olmesartan medoxomil-hctz) .Marland Kitchen... 1 by mouth once daily  BP today: 152/90 Prior BP: 182/90 (06/08/2010)  Labs Reviewed: K+: 4.5 (03/06/2010) Creat: : 1.0 (03/06/2010)   Chol: 157 (03/06/2010)   HDL: 50.70 (03/06/2010)   LDL: 94 (03/06/2010)   TG: 62.0 (03/06/2010) improved overall by hx and exam, ok to continue meds/tx as is , pt declines further med change today  Problem # 4:  GERD (ICD-530.81)  Her updated medication list for this problem includes:    Omeprazole 20 Mg Cpdr (Omeprazole) .Marland Kitchen... Take 1 capsule by mouth twice a day Continue all previous medications as before this visit  - stable overall by hx and exam, ok to continue meds/tx as is   Labs Reviewed: Hgb: 13.6 (09/05/2009)   Hct: 41.8 (09/05/2009)  Complete Medication List: 1)  Bystolic 10 Mg Tabs (Nebivolol hcl) .... One by mouth qd 2)  Omeprazole 20 Mg Cpdr (Omeprazole) .... Take 1 capsule by mouth twice a day 3)  Qvar 80 Mcg/act Aers (Beclomethasone dipropionate) .... Two times a day 4)  Albuterol 90 Mcg/act Aers (Albuterol) .... As needed 5)  Singulair 10 Mg Tabs (Montelukast sodium) .... Take 1 tablet by mouth once a day 6)  Norvasc 10 Mg Tabs (Amlodipine besylate) .... Take 1 tablet by mouth once a day 7)  Simvastatin 40 Mg Tabs (Simvastatin) .... 1/2 po once daily 8)  Voltaren 1 % Gel (Diclofenac sodium) .... Apply three times a day - qid 9)  Benicar Hct 40-25 Mg Tabs (Olmesartan medoxomil-hctz) .Marland Kitchen.. 1 by mouth once daily 10)  Ibuprofen 400 Mg Tabs (Ibuprofen) .Marland Kitchen.. 1po q 6 hrs as needed 11)  Metformin Hcl 500 Mg Tabs (Metformin  hcl) .Marland KitchenMarland KitchenMarland Kitchen  1po once daily 12)  Cortisporin 3.5-10000-1 Soln (Neomycin-polymyxin-hc) .... 2 gtts right ear four times per day for 10 days 13)  Alprazolam 0.25 Mg Tabs (Alprazolam) .Marland Kitchen.. 1 by mouth two times a day as needed 14)  Zolpidem Tartrate 10 Mg Tabs (Zolpidem tartrate) .Marland Kitchen.. 1 by mouth at bedtime as needed sleep 15)  Tramadol Hcl 50 Mg Tabs (Tramadol hcl) .Marland Kitchen.. 1 by mouth q 6 hrs as needed pain 16)  Hydrocodone-homatropine 5-1.5 Mg/46ml Syrp (Hydrocodone-homatropine) .Marland Kitchen.. 1 tsp by mouth q 6 hrs as needed cough  Patient Instructions: 1)  Continue all previous medications as before this visit  2)  Please schedule a follow-up appointment as needed.   Orders Added: 1)  Est. Patient Level IV [41324]

## 2010-10-16 LAB — GLUCOSE, CAPILLARY: Glucose-Capillary: 101 mg/dL — ABNORMAL HIGH (ref 70–99)

## 2010-10-21 ENCOUNTER — Other Ambulatory Visit: Payer: Self-pay | Admitting: Internal Medicine

## 2010-12-08 NOTE — Op Note (Signed)
NAMELOUISIANA, SEARLES                ACCOUNT NO.:  [id number]   MEDICAL RECORD NO.:  [id number]          PATIENT TYPE:  AMB   LOCATION:  ENDO                         FACILITY:  [facility]   PHYSICIAN:  John C. Madilyn Fireman, M.D.    DATE OF BIRTH:  05-20-1951   DATE OF PROCEDURE:  06/29/2004  DATE OF DISCHARGE:                                 OPERATIVE REPORT   PROCEDURE:  Colonoscopy.   INDICATION FOR PROCEDURE:  Average risk colon cancer screening.   DESCRIPTION OF PROCEDURE:  The patient was placed in the left lateral  decubitus position and placed on the pulse monitor with continuous low-flow  oxygen delivered by nasal cannula.  She was sedated with 62.5 mcg IV  fentanyl and 6 mg IV Versed.  The Olympus video colonoscope was inserted  into the rectum and advanced to the cecum, confirmed by transillumination at  McBurney's point and visualization of the ileocecal valve and appendiceal  orifice.  The prep was excellent.  The cecum, ascending, transverse,  descending, and sigmoid colon all appeared normal with no masses, polyps,  diverticula, or other mucosal abnormalities.  Within the rectum there was  seen a small 6 mm polyp which was fulgurated by hot biopsy.  The remainder  of the rectum appeared normal.  The scope was then withdrawn and the patient  returned to the recovery room in stable condition.  She tolerated the  procedure well, and there were no immediate complications.   IMPRESSION:  Small rectal polyp.   PLAN:  Await histology to determine method and interval for future colon  screening.      JCH/MEDQ  D:  06/29/2004  T:  06/29/2004  Job:  [id number]   cc:   Sibyl Parr. Darrick Penna, M.D.  Fax: [phone]

## 2010-12-08 NOTE — Op Note (Signed)
Brooke [name], Brooke [name]                    ACCOUNT NO.:  [id number]   MEDICAL RECORD NO.:  [id number]                   PATIENT TYPE:  OBV   LOCATION:  0457                                 FACILITY:  [facility]   PHYSICIAN:  Currie Paris, M.D.           DATE OF BIRTH:  13-Mar-1951   DATE OF PROCEDURE:  10/01/2002  DATE OF DISCHARGE:                                 OPERATIVE REPORT   OFFICE MEDICAL RECORD NUMBER:  [id number].   PREOPERATIVE DIAGNOSIS:  Chronic calculus cholecystitis.   POSTOPERATIVE DIAGNOSIS:  Chronic calculus cholecystitis.   OPERATION:  Laparoscopic cholecystectomy with operative cholangiogram.   SURGEON:  Currie Paris, M.D.   ASSISTANT:  Abigail Miyamoto, M.D.   ANESTHESIA:  General endotracheal.   CLINICAL HISTORY:  The patient is a 60 year old who presented to the  emergency room with biliary-type symptoms and work-up for gallstones.  She  is also hypertensive and after preoperative evaluation and adjustment of her  medications, was brought to the operating room for laparoscopic  cholecystectomy.   DESCRIPTION OF PROCEDURE:  The patient seen in the holding area, and she had  no further questions and was prepared for cholecystectomy.  She was taken to  the operating room and after satisfactory general endotracheal anesthesia  had been obtained, the abdomen was prepped and draped.  Marcaine 0.25% plain  was used for each incision.  The umbilical incision was made first and went  through the layers carefully because she had a prior lower midline scar but  was able to enter the midline without difficulty, and the peritoneal cavity  was entered.  A pursestring was placed, the Hasson introduced.  The abdomen  was insufflated to 15.  The camera was placed.  We saw no gross  abnormalities in the upper abdomen.  The patient was placed in reverse  Trendelenburg, tilted to the left, and additional trocars were placed in the  usual fashion with the  10-11 on the epigastrium and two 5's laterally.  With  the camera in the epigastric port, I was able to scan the lower abdomen and  see that she just had a few omental adhesions to the midline but no other  problems down there.   The gallbladder was grasped and retracted and the peritoneum over the cystic  duct and triangle of Calot opened on both the anterior and posterior  aspects.  The cystic artery was seen as it was coming up onto the  gallbladder, clipped and divided to get a little bit more mobility.  Long  cystic duct was dissected out and a large window made, so I had complete  visualization of the triangle of Calot.  The cystic duct was clipped central  to the gallbladder and opened.  A Cook catheter was introduced  percutaneously, placed in the cystic duct, and held with a clip.  Operative  cholangiography appeared normal with good filling of the common duct,  hepatic radicles, and duodenum  without evidence of any stones or  obstruction.   The catheter was removed and three clips placed on the stay side of the  duct, and it was divided.  The gallbladder was removed from below to above  with the coagulation part of the cautery.  Just prior to disconnecting, we  irrigated and made sure everything was dry.  The gallbladder was placed in a  bag and brought out the umbilical port.  The abdomen was reinsufflated and  final check for hemostasis and a final irrigation done, and everything  appeared to be dry.  The lateral ports were removed, and there was no  bleeding.  The umbilical port was closed with a pursestring, using the  camera to make sure nothing was trapped in that.  The abdomen was deflated  through the epigastric port as it was removed.  The skin was closed with 4-0  Monocryl subcuticular plus Steri-Strips.   The patient tolerated the procedure well.  There were no operative  complications.  All counts were correct.                                                Currie Paris, M.D.    CJS/MEDQ  D:  10/01/2002  T:  10/01/2002  Job:  [id number]   cc:   Bradly Bienenstock, M.D.  [facility] Resident  [place], [place] [address]  Fax: [phone]

## 2010-12-08 NOTE — Op Note (Signed)
Brooke [name], Brooke [name]                ACCOUNT NO.:  [id number]   MEDICAL RECORD NO.:  [id number]          PATIENT TYPE:  AMB   LOCATION:  DSC                          FACILITY:  [facility]   PHYSICIAN:  Currie Paris, M.D.DATE OF BIRTH:  1951-06-22   DATE OF PROCEDURE:  12/20/2005  DATE OF DISCHARGE:                                 OPERATIVE REPORT   OFFICE MEDICAL RECORD NUMBER:  [id number].   PREOPERATIVE DIAGNOSIS:  Abdominal wall mass, question lipoma or  neurofibroma.   POSTOPERATIVE DIAGNOSIS:  Abdominal wall mass, question lipoma or  neurofibroma.   OPERATION:  Excision of abdominal wall mass.   SURGEON:  Dr. Jamey Ripa.   ANESTHESIA:  MAC.   CLINICAL HISTORY:  Brooke [name] is a 60 year old lady with a gradually  enlarging symptomatic mass off of the right flank area.  She decided to have  this excised.   DESCRIPTION OF PROCEDURE:  The patient was seen in the holding area and had  no further questions.  We identified and marked the lesion to be removed.   The patient was taken to the operating room and after satisfactory IV  sedation was turned left lateral position with the right side up.  The area  of the mass was prepped and draped.  The time-out occurred.   I injected a combination of 1% Xylocaine with epinephrine and 0.5% plain  Marcaine mixed equally.  I infiltrated underneath the skin, all around it  and into the subcutaneous tissues.   I made an elliptical incision to encompass the entire mass.  It was removed  using cautery, staying in the subcu tissue under the mass.  This was fairly  large and protuberant, so I was able to put up the skin and get under it  easily.   I made sure everything was dry, using cautery to do so.  The incision was  closed in layers using 3-0 Vicryl to close the subcu and get a good  approximation of the skin followed by 3-0 Monocryl subcuticular and some  Dermabond.   The patient tolerated the procedure well, and there were no  operative  complications.  All counts were correct.      Currie Paris, M.D.  Electronically Signed    CJS/MEDQ  D:  12/20/2005  T:  12/20/2005  Job:  [id number]

## 2010-12-19 ENCOUNTER — Encounter: Payer: Self-pay | Admitting: Internal Medicine

## 2010-12-19 DIAGNOSIS — Z0000 Encounter for general adult medical examination without abnormal findings: Secondary | ICD-10-CM | POA: Insufficient documentation

## 2010-12-19 DIAGNOSIS — R7302 Impaired glucose tolerance (oral): Secondary | ICD-10-CM | POA: Insufficient documentation

## 2010-12-19 DIAGNOSIS — Z0001 Encounter for general adult medical examination with abnormal findings: Secondary | ICD-10-CM | POA: Insufficient documentation

## 2010-12-20 ENCOUNTER — Encounter: Payer: Self-pay | Admitting: Internal Medicine

## 2010-12-20 ENCOUNTER — Ambulatory Visit (INDEPENDENT_AMBULATORY_CARE_PROVIDER_SITE_OTHER): Payer: PRIVATE HEALTH INSURANCE | Admitting: Internal Medicine

## 2010-12-20 VITALS — BP 160/100 | HR 82 | Temp 97.7°F | Ht 63.0 in | Wt 188.1 lb

## 2010-12-20 DIAGNOSIS — R0989 Other specified symptoms and signs involving the circulatory and respiratory systems: Secondary | ICD-10-CM

## 2010-12-20 DIAGNOSIS — E119 Type 2 diabetes mellitus without complications: Secondary | ICD-10-CM

## 2010-12-20 DIAGNOSIS — G4700 Insomnia, unspecified: Secondary | ICD-10-CM

## 2010-12-20 DIAGNOSIS — M79609 Pain in unspecified limb: Secondary | ICD-10-CM

## 2010-12-20 DIAGNOSIS — M79676 Pain in unspecified toe(s): Secondary | ICD-10-CM

## 2010-12-20 DIAGNOSIS — R0609 Other forms of dyspnea: Secondary | ICD-10-CM | POA: Insufficient documentation

## 2010-12-20 DIAGNOSIS — J45901 Unspecified asthma with (acute) exacerbation: Secondary | ICD-10-CM

## 2010-12-20 MED ORDER — PREDNISONE 10 MG PO TABS: 10.0000 mg | ORAL_TABLET | Freq: Every day | ORAL | Status: AC

## 2010-12-20 MED ORDER — BECLOMETHASONE DIPROPIONATE 80 MCG/ACT IN AERS: 1.0000 | INHALATION_SPRAY | Freq: Two times a day (BID) | RESPIRATORY_TRACT | Status: DC

## 2010-12-20 MED ORDER — ZOLPIDEM TARTRATE 10 MG PO TABS: 10.0000 mg | ORAL_TABLET | Freq: Every evening | ORAL | Status: DC | PRN

## 2010-12-20 MED ORDER — ALBUTEROL 90 MCG/ACT IN AERS: 2.0000 | INHALATION_SPRAY | Freq: Four times a day (QID) | RESPIRATORY_TRACT | Status: DC | PRN

## 2010-12-20 NOTE — Patient Instructions (Addendum)
Take all new medications as prescribed - the medication for sleep, and prednisone Continue all other medications as before, including the inhalers Please go to XRAY in the Basement for the x-ray test Please go to LAB in the Basement for the blood and/or urine tests to be done today Please call the phone number [phone] (the PhoneTree System) for results of testing in 2-3 days;  When calling, simply dial the number, and when prompted enter the MRN number above (the Medical Record Number) and the # key, then the message should start. You will be contacted regarding the referral for: echocardiogram, and podiatry

## 2010-12-20 NOTE — Assessment & Plan Note (Signed)
?   Etiology, declines ecg due she needs to catch the [employer] bus home, will return later for labs/cxr/echo

## 2010-12-20 NOTE — Assessment & Plan Note (Signed)
Right great toe with loose nail, no s/s of infection - for podiatry referral

## 2010-12-25 ENCOUNTER — Encounter: Payer: Self-pay | Admitting: Internal Medicine

## 2010-12-25 NOTE — Assessment & Plan Note (Signed)
Mild, Continue all other medications as before, add low dose prednisone for current flare, call if not improved

## 2010-12-25 NOTE — Assessment & Plan Note (Signed)
stable overall by hx and exam, most recent data reviewed with pt, and pt to continue medical treatment as before \ Lab Results  Component Value Date   HGBA1C 6.0 03/06/2010

## 2010-12-25 NOTE — Progress Notes (Signed)
Subjective:    Patient ID: Brooke [name], female    DOB: 05-29-1951, 60 y.o.   MRN: [id number]  HPI Here with 3-4 days onset mild worsening DOE with wheezing, but no significant sob at rest;  Pt denies chest pain, orthopnea, PND, increased LE swelling, palpitations, dizziness or syncope. Pt denies new neurological symptoms such as new headache, or facial or extremity weakness or numbness  Pt denies polydipsia, polyuria, or low sugar symptoms such as weakness or confusion improved with po intake.  Pt states overall good compliance with meds, trying to follow lower cholesterol, diabetic diet, wt overall stable but little exercise however.    Denies worsening depressive symptoms, suicidal ideation, or panic, though has ongoing anxiety, not increased recently.  Past Medical History  Diagnosis Date  . ALOPECIA 12/30/2007  . ANEMIA-NOS 02/09/2008  . CHEST PAIN 02/28/2009  . COLD SORE 10/01/2007  . COLONIC POLYPS, HX OF 02/26/2007  . DIABETES MELLITUS, TYPE II 11/29/2008  . ECZEMA, ATOPIC DERMATITIS 09/19/2006  . FATIGUE 09/05/2009  . GERD 02/09/2008  . GOITER NOS 09/19/2006  . GOUT 08/09/2008  . HYPERLIPIDEMIA 10/01/2007  . HYPERTENSION, BENIGN SYSTEMIC 09/19/2006  . HYPERTENSION 02/26/2007  . INSOMNIA NOS 09/19/2006  . Other abnormal glucose 10/01/2007  . OTITIS EXTERNA, RIGHT 02/28/2009  . Pain in joint, lower leg 10/01/2007  . SINUSITIS- ACUTE-NOS 05/20/2009  . SKIN LESION 08/09/2008  . Unspecified asthma 09/19/2006  . Urinary frequency 11/29/2008  . Wheezing 12/30/2009  . Impaired glucose tolerance 12/19/2010   Past Surgical History  Procedure Date  . Cholecystectomy 3.1.2004  . Abdominal hysterectomy 10/22/1999    BSO  . L ras re-stented - 03/08/2005  . L ras tx'd w/angioplasty 11/21/2003  . Left cataract surgury   . Radioactive iodine ablate 3/03    thyroid 10/07/2001  . Rastelli procedure 6/98 neg  . Split sleep study neg for sleep apnea 11/15/2003    reports that she has never smoked. She does not have  any smokeless tobacco history on file. She reports that she does not drink alcohol or use illicit drugs. family history includes Diabetes in her mother. Allergies  Allergen Reactions  . Ace Inhibitors     REACTION: Patient denies allery  . Codeine     REACTION: rash nausea  . Tussionex Pennkinetic Er     REACTION: vomiting   Current Outpatient Prescriptions on File Prior to Visit  Medication Sig Dispense Refill  . ALPRAZolam (XANAX) 0.25 MG tablet Take 0.25 mg by mouth 2 (two) times daily as needed.        Marland Kitchen amLODipine (NORVASC) 10 MG tablet Take 10 mg by mouth daily.        . diclofenac sodium (VOLTAREN) 1 % GEL Apply three times to four times per day       . ibuprofen (ADVIL,MOTRIN) 400 MG tablet TAKE 1 TABLET BY MOUTH EVERY 6 HOURS AS NEEDED FOR PAIN  120 tablet  4  . metFORMIN (GLUCOPHAGE) 500 MG tablet Take 500 mg by mouth daily.        . montelukast (SINGULAIR) 10 MG tablet Take 10 mg by mouth daily.        . nebivolol (BYSTOLIC) 10 MG tablet Take 10 mg by mouth daily.        Marland Kitchen olmesartan-hydrochlorothiazide (BENICAR HCT) 40-25 MG per tablet Take 1 tablet by mouth daily.        . simvastatin (ZOCOR) 40 MG tablet 1/2 by mouth once daily       .  zolpidem (AMBIEN) 10 MG tablet Take 10 mg by mouth at bedtime as needed.        Marland Kitchen HYDROcodone-homatropine (HYCODAN) 5-1.5 MG/5ML syrup Take by mouth every 6 (six) hours as needed.        . NEOMYCIN-POLYMYXIN-HC, OTIC, (CORTISPORIN) 1 % SOLN Place 2 drops into the right ear 4 (four) times daily. For 10 days       . omeprazole (PRILOSEC) 20 MG capsule Take 20 mg by mouth 2 (two) times daily.        . traMADol (ULTRAM) 50 MG tablet Take 50 mg by mouth every 6 (six) hours as needed.         Review of Systems All otherwise neg per pt, except does have loose nail to the great toe for over a wk without pain, bleeding, and needs ambien refill for chronic persistent sleep difficulty    Objective:   Physical Exam BP 160/100  Pulse 82  Temp(Src)  97.7 F (36.5 C) (Oral)  Ht 5\' 3"  (1.6 m)  Wt 188 lb 2 oz (85.333 kg)  BMI 33.32 kg/m2  SpO2 95% Physical Exam  VS noted Constitutional: Pt appears well-developed and well-nourished.  HENT: Head: Normocephalic.  Right Ear: External ear normal.  Left Ear: External ear normal.  Eyes: Conjunctivae and EOM are normal. Pupils are equal, round, and reactive to light.  Neck: Normal range of motion. Neck supple.  Cardiovascular: Normal rate and regular rhythm.   Pulmonary/Chest: Effort normal and breath sounds decreased with bilat mild wheezing.  Abd:  Soft, NT, non-distended, + BS Neurological: Pt is alert. No cranial nerve deficit.  Skin: Skin is warm. No erythema. Great toenail is loose/onychomycotic, no bleeding, paronychia Psychiatric: Pt behavior is normal. Thought content normal.         Assessment & Plan:

## 2010-12-29 ENCOUNTER — Telehealth: Payer: Self-pay | Admitting: Internal Medicine

## 2010-12-29 MED ORDER — AMLODIPINE BESYLATE 10 MG PO TABS: 10.0000 mg | ORAL_TABLET | Freq: Every day | ORAL | Status: DC

## 2010-12-29 NOTE — Telephone Encounter (Signed)
Refill- amlodipine besylate 10mg  tablets. Take 1 tablet by mouth every day. Qty 30. Last fill 4.29.12

## 2010-12-29 NOTE — Telephone Encounter (Signed)
To robin for routines 

## 2011-01-02 ENCOUNTER — Ambulatory Visit (INDEPENDENT_AMBULATORY_CARE_PROVIDER_SITE_OTHER)
Admission: RE | Admit: 2011-01-02 | Discharge: 2011-01-02 | Disposition: A | Discharge status: Home / Self Care | Payer: PRIVATE HEALTH INSURANCE | Source: Ambulatory Visit | Attending: Internal Medicine | Admitting: Internal Medicine

## 2011-01-02 ENCOUNTER — Other Ambulatory Visit (INDEPENDENT_AMBULATORY_CARE_PROVIDER_SITE_OTHER): Payer: PRIVATE HEALTH INSURANCE

## 2011-01-02 ENCOUNTER — Encounter: Payer: Self-pay | Admitting: Internal Medicine

## 2011-01-02 DIAGNOSIS — R0989 Other specified symptoms and signs involving the circulatory and respiratory systems: Secondary | ICD-10-CM

## 2011-01-02 DIAGNOSIS — E119 Type 2 diabetes mellitus without complications: Secondary | ICD-10-CM

## 2011-01-02 LAB — CBC WITH DIFFERENTIAL/PLATELET
Basophils Absolute: 0 10*3/uL (ref 0.0–0.1)
Eosinophils Absolute: 0 10*3/uL (ref 0.0–0.7)
Lymphocytes Relative: 11 % — ABNORMAL LOW (ref 12.0–46.0)
MCHC: 33.2 g/dL (ref 30.0–36.0)
Monocytes Relative: 4.9 % (ref 3.0–12.0)
Neutro Abs: 12.6 10*3/uL — ABNORMAL HIGH (ref 1.4–7.7)
Neutrophils Relative %: 83.9 % — ABNORMAL HIGH (ref 43.0–77.0)
Platelets: 277 10*3/uL (ref 150.0–400.0)
RDW: 15 % — ABNORMAL HIGH (ref 11.5–14.6)

## 2011-01-02 LAB — HEPATIC FUNCTION PANEL
AST: 15 U/L (ref 0–37)
Albumin: 4.3 g/dL (ref 3.5–5.2)
Alkaline Phosphatase: 94 U/L (ref 39–117)
Total Bilirubin: 0.3 mg/dL (ref 0.3–1.2)

## 2011-01-02 LAB — TSH: TSH: 0.61 u[IU]/mL (ref 0.35–5.50)

## 2011-01-02 LAB — LIPID PANEL
HDL: 66.1 mg/dL (ref >39.00)
Triglycerides: 81 mg/dL (ref 0.0–149.0)
VLDL: 16.2 mg/dL (ref 0.0–40.0)

## 2011-01-02 LAB — BASIC METABOLIC PANEL
CO2: 27 mEq/L (ref 19–32)
Calcium: 9.5 mg/dL (ref 8.4–10.5)
Chloride: 102 mEq/L (ref 96–112)
Creatinine, Ser: 1.3 mg/dL — ABNORMAL HIGH (ref 0.4–1.2)
Glucose, Bld: 115 mg/dL — ABNORMAL HIGH (ref 70–99)
Sodium: 136 mEq/L (ref 135–145)

## 2011-01-02 LAB — HEMOGLOBIN A1C: Hgb A1c MFr Bld: 6.3 % (ref 4.6–6.5)

## 2011-01-02 LAB — MICROALBUMIN / CREATININE URINE RATIO: Microalb, Ur: 0.6 mg/dL (ref 0.0–1.9)

## 2011-01-03 ENCOUNTER — Ambulatory Visit (INDEPENDENT_AMBULATORY_CARE_PROVIDER_SITE_OTHER): Payer: PRIVATE HEALTH INSURANCE | Admitting: Internal Medicine

## 2011-01-03 ENCOUNTER — Ambulatory Visit (HOSPITAL_COMMUNITY): Payer: PRIVATE HEALTH INSURANCE | Attending: Internal Medicine | Admitting: Radiology

## 2011-01-03 ENCOUNTER — Other Ambulatory Visit (INDEPENDENT_AMBULATORY_CARE_PROVIDER_SITE_OTHER): Payer: PRIVATE HEALTH INSURANCE

## 2011-01-03 ENCOUNTER — Encounter: Payer: Self-pay | Admitting: Internal Medicine

## 2011-01-03 VITALS — BP 142/90 | HR 73 | Temp 98.7°F | Ht 63.0 in | Wt 188.1 lb

## 2011-01-03 DIAGNOSIS — J209 Acute bronchitis, unspecified: Secondary | ICD-10-CM

## 2011-01-03 DIAGNOSIS — I10 Essential (primary) hypertension: Secondary | ICD-10-CM

## 2011-01-03 DIAGNOSIS — R0989 Other specified symptoms and signs involving the circulatory and respiratory systems: Secondary | ICD-10-CM | POA: Insufficient documentation

## 2011-01-03 DIAGNOSIS — D72829 Elevated white blood cell count, unspecified: Secondary | ICD-10-CM

## 2011-01-03 DIAGNOSIS — R0609 Other forms of dyspnea: Secondary | ICD-10-CM | POA: Insufficient documentation

## 2011-01-03 DIAGNOSIS — E119 Type 2 diabetes mellitus without complications: Secondary | ICD-10-CM

## 2011-01-03 LAB — URINALYSIS, ROUTINE W REFLEX MICROSCOPIC
Hgb urine dipstick: NEGATIVE
Nitrite: NEGATIVE
Total Protein, Urine: NEGATIVE
Urine Glucose: NEGATIVE
pH: 6 (ref 5.0–8.0)

## 2011-01-03 MED ORDER — LEVOFLOXACIN 250 MG PO TABS: 250.0000 mg | ORAL_TABLET | Freq: Every day | ORAL | Status: AC

## 2011-01-03 NOTE — Assessment & Plan Note (Signed)
Improved overall by hx and exam, most recent data reviewed with pt, and pt to continue medical treatment as before  BP Readings from Last 3 Encounters:  01/03/11 142/90  12/20/10 160/100  07/10/10 152/90

## 2011-01-03 NOTE — Patient Instructions (Signed)
Take all new medications as prescribed - the antibiotic, sent to the pharmacy Continue all other medications as before Please go to LAB in the Basement for the urine tests to be done today Please call the phone number [phone] (the [employer] System) for results of testing in 2-3 days;  When calling, simply dial the number, and when prompted enter the MRN number above (the Medical Record Number) and the # key, then the message should start. Please keep your appointment on June 25, at which time we will plan to repeat the blood counts

## 2011-01-03 NOTE — Progress Notes (Signed)
Subjective:    Patient ID: Brooke [name], female    DOB: 03-04-1951, 60 y.o.   MRN: [id number]  HPI Pt called to office for evaluation after labs for June 25 planned visit showed elev wbc 15K;  cxr neg, but UA not done apparently for some reason with rest of labs; Has had acute onset mild to mod 2-3 days ST, HA, general weakness and malaise, with prod cough greenish sputum, but Pt denies chest pain, increased sob or doe, wheezing, orthopnea, PND, increased LE swelling, palpitations, dizziness or syncope. Pt denies new neurological symptoms such as new headache, or facial or extremity weakness or numbness   Pt denies polydipsia, polyuria.  Past Medical History  Diagnosis Date  . ALOPECIA 12/30/2007  . ANEMIA-NOS 02/09/2008  . CHEST PAIN 02/28/2009  . COLD SORE 10/01/2007  . COLONIC POLYPS, HX OF 02/26/2007  . DIABETES MELLITUS, TYPE II 11/29/2008  . ECZEMA, ATOPIC DERMATITIS 09/19/2006  . FATIGUE 09/05/2009  . GERD 02/09/2008  . GOITER NOS 09/19/2006  . GOUT 08/09/2008  . HYPERLIPIDEMIA 10/01/2007  . HYPERTENSION, BENIGN SYSTEMIC 09/19/2006  . HYPERTENSION 02/26/2007  . INSOMNIA NOS 09/19/2006  . Other abnormal glucose 10/01/2007  . OTITIS EXTERNA, RIGHT 02/28/2009  . Pain in joint, lower leg 10/01/2007  . SINUSITIS- ACUTE-NOS 05/20/2009  . SKIN LESION 08/09/2008  . Unspecified asthma 09/19/2006  . Urinary frequency 11/29/2008  . Wheezing 12/30/2009  . Impaired glucose tolerance 12/19/2010   Past Surgical History  Procedure Date  . Cholecystectomy 3.1.2004  . Abdominal hysterectomy 10/22/1999    BSO  . L ras re-stented - 03/08/2005  . L ras tx'd w/angioplasty 11/21/2003  . Left cataract surgury   . Radioactive iodine ablate 3/03    thyroid 10/07/2001  . Rastelli procedure 6/98 neg  . Split sleep study neg for sleep apnea 11/15/2003    reports that she has never smoked. She does not have any smokeless tobacco history on file. She reports that she does not drink alcohol or use illicit drugs. family history  includes Diabetes in her mother. Allergies  Allergen Reactions  . Ace Inhibitors     REACTION: Patient denies allery  . Codeine     REACTION: rash nausea  . Tussionex Pennkinetic Er     REACTION: vomiting   Current Outpatient Prescriptions on File Prior to Visit  Medication Sig Dispense Refill  . albuterol (PROVENTIL,VENTOLIN) 90 MCG/ACT inhaler Inhale 2 puffs into the lungs every 6 (six) hours as needed.  17 g  5  . ALPRAZolam (XANAX) 0.25 MG tablet Take 0.25 mg by mouth 2 (two) times daily as needed.        Marland Kitchen amLODipine (NORVASC) 10 MG tablet Take 1 tablet (10 mg total) by mouth daily.  30 tablet  11  . beclomethasone (QVAR) 80 MCG/ACT inhaler Inhale 1 puff into the lungs 2 (two) times daily.  1 Inhaler  11  . diclofenac sodium (VOLTAREN) 1 % GEL Apply three times to four times per day       . ibuprofen (ADVIL,MOTRIN) 400 MG tablet TAKE 1 TABLET BY MOUTH EVERY 6 HOURS AS NEEDED FOR PAIN  120 tablet  4  . metFORMIN (GLUCOPHAGE) 500 MG tablet Take 500 mg by mouth daily.        . montelukast (SINGULAIR) 10 MG tablet Take 10 mg by mouth daily.        . nebivolol (BYSTOLIC) 10 MG tablet Take 10 mg by mouth daily.        Marland Kitchen  olmesartan-hydrochlorothiazide (BENICAR HCT) 40-25 MG per tablet Take 1 tablet by mouth daily.        . simvastatin (ZOCOR) 40 MG tablet 1/2 by mouth once daily       . zolpidem (AMBIEN) 10 MG tablet Take 10 mg by mouth at bedtime as needed.         Review of Systems Review of Systems  Constitutional: Negative for diaphoresis and unexpected weight change.  HENT: Negative for drooling and tinnitus.   Eyes: Negative for photophobia and visual disturbance.  Respiratory: Negative for choking and stridor.      Denies urinary symptoms such as dysuria, frequency, urgency,or hematuria.      Objective:   Physical Exam BP 142/90  Pulse 73  Temp(Src) 98.7 F (37.1 C) (Oral)  Ht 5\' 3"  (1.6 m)  Wt 188 lb 2 oz (85.333 kg)  BMI 33.32 kg/m2  SpO2 99% Physical Exam  VS  noted, mild ill  Constitutional: Pt appears well-developed and well-nourished.  HENT: Head: Normocephalic.  Right Ear: External ear normal.  Left Ear: External ear normal.  Bilat tm's mild erythema.  Sinus nontender.  Pharynx mild erythema Eyes: Conjunctivae and EOM are normal. Pupils are equal, round, and reactive to light.  Neck: Normal range of motion. Neck supple.  Cardiovascular: Normal rate and regular rhythm.   Pulmonary/Chest: Effort normal and breath sounds normal.  Abd:  Soft, NT, non-distended, + BS Neurological: Pt is alert. No cranial nerve deficit.  Skin: Skin is warm. No erythema.  Psychiatric: Pt behavior is normal. Thought content normal.         Assessment & Plan:

## 2011-01-03 NOTE — Assessment & Plan Note (Signed)
cxr neg, prob bronchitis, but not clear this account for the leukocytosis; for levaquin course today

## 2011-01-03 NOTE — Assessment & Plan Note (Signed)
Unclear etiology - also for UA since not done yesterday;  And will plan f/u cbc next visit

## 2011-01-03 NOTE — Progress Notes (Signed)
Quick Note:  Voice message left on [employer] system - lab is negative, normal or otherwise stable, pt to continue same tx ______ 

## 2011-01-03 NOTE — Assessment & Plan Note (Signed)
stable overall by hx and exam, most recent data reviewed with pt, and pt to continue medical treatment as before  Lab Results  Component Value Date   HGBA1C 6.3 01/02/2011

## 2011-01-04 ENCOUNTER — Encounter (HOSPITAL_COMMUNITY): Payer: Self-pay | Admitting: Internal Medicine

## 2011-01-15 ENCOUNTER — Encounter: Payer: Self-pay | Admitting: Internal Medicine

## 2011-01-15 ENCOUNTER — Other Ambulatory Visit (INDEPENDENT_AMBULATORY_CARE_PROVIDER_SITE_OTHER): Payer: PRIVATE HEALTH INSURANCE

## 2011-01-15 ENCOUNTER — Ambulatory Visit (INDEPENDENT_AMBULATORY_CARE_PROVIDER_SITE_OTHER): Payer: PRIVATE HEALTH INSURANCE | Admitting: Internal Medicine

## 2011-01-15 VITALS — BP 150/90 | HR 83 | Temp 97.9°F | Ht 63.0 in | Wt 187.5 lb

## 2011-01-15 DIAGNOSIS — J209 Acute bronchitis, unspecified: Secondary | ICD-10-CM

## 2011-01-15 DIAGNOSIS — I5189 Other ill-defined heart diseases: Secondary | ICD-10-CM | POA: Insufficient documentation

## 2011-01-15 DIAGNOSIS — J45901 Unspecified asthma with (acute) exacerbation: Secondary | ICD-10-CM

## 2011-01-15 DIAGNOSIS — E119 Type 2 diabetes mellitus without complications: Secondary | ICD-10-CM

## 2011-01-15 DIAGNOSIS — D72829 Elevated white blood cell count, unspecified: Secondary | ICD-10-CM

## 2011-01-15 DIAGNOSIS — Z0000 Encounter for general adult medical examination without abnormal findings: Secondary | ICD-10-CM

## 2011-01-15 LAB — CBC WITH DIFFERENTIAL/PLATELET
Basophils Relative: 0.5 % (ref 0.0–3.0)
Eosinophils Relative: 0.9 % (ref 0.0–5.0)
MCV: 82 fl (ref 78.0–100.0)
Monocytes Absolute: 0.7 10*3/uL (ref 0.1–1.0)
Neutrophils Relative %: 67.2 % (ref 43.0–77.0)
RBC: 4.66 Mil/uL (ref 3.87–5.11)
WBC: 7.4 10*3/uL (ref 4.5–10.5)

## 2011-01-15 NOTE — Assessment & Plan Note (Signed)
Resolved, Continue all other medications as before

## 2011-01-15 NOTE — Progress Notes (Signed)
Subjective:    Patient ID: Brooke [name], female    DOB: [birth year], 60 y.o.   MRN: [id number]  HPI  Here to f/u;   Pt denies fever, wt loss, night sweats, loss of appetite, or other constitutional symptoms  Pt denies chest pain, increased sob or doe, wheezing, orthopnea, PND, increased LE swelling, palpitations, dizziness or syncope.  Pt denies new neurological symptoms such as new headache, or facial or extremity weakness or numbness   Pt denies polydipsia, polyuria.  Overall good compliance with treatment, and good medicine tolerability.  Denies worsening depressive symptoms, suicidal ideation, or panic.  No new complaitns Past Medical History  Diagnosis Date  . ALOPECIA 12/30/2007  . ANEMIA-NOS 02/09/2008  . CHEST PAIN 02/28/2009  . COLD SORE 10/01/2007  . COLONIC POLYPS, HX OF 02/26/2007  . DIABETES MELLITUS, TYPE II 11/29/2008  . ECZEMA, ATOPIC DERMATITIS 09/19/2006  . FATIGUE 09/05/2009  . GERD 02/09/2008  . GOITER NOS 09/19/2006  . GOUT 08/09/2008  . HYPERLIPIDEMIA 10/01/2007  . HYPERTENSION, BENIGN SYSTEMIC 09/19/2006  . HYPERTENSION 02/26/2007  . INSOMNIA NOS 09/19/2006  . Other abnormal glucose 10/01/2007  . OTITIS EXTERNA, RIGHT 02/28/2009  . Pain in joint, lower leg 10/01/2007  . SINUSITIS- ACUTE-NOS 05/20/2009  . SKIN LESION 08/09/2008  . Unspecified asthma 09/19/2006  . Urinary frequency 11/29/2008  . Wheezing 12/30/2009  . Impaired glucose tolerance 12/19/2010   Past Surgical History  Procedure Date  . Cholecystectomy 3.1.2004  . Abdominal hysterectomy 10/22/1999    BSO  . L ras re-stented - 03/08/2005  . L ras tx'd w/angioplasty 11/21/2003  . Left cataract surgury   . Radioactive iodine ablate 3/03    thyroid 10/07/2001  . Rastelli procedure 6/98 neg  . Split sleep study neg for sleep apnea 11/15/2003    reports that she has never smoked. She does not have any smokeless tobacco history on file. She reports that she does not drink alcohol or use illicit drugs. family history includes  Diabetes in her mother. Allergies  Allergen Reactions  . Ace Inhibitors     REACTION: Patient denies allery  . Codeine     REACTION: rash nausea  . Tussionex Pennkinetic Er     REACTION: vomiting   Current Outpatient Prescriptions on File Prior to Visit  Medication Sig Dispense Refill  . albuterol (PROVENTIL,VENTOLIN) 90 MCG/ACT inhaler Inhale 2 puffs into the lungs every 6 (six) hours as needed.  17 g  5  . ALPRAZolam (XANAX) 0.25 MG tablet Take 0.25 mg by mouth 2 (two) times daily as needed.        Marland Kitchen amLODipine (NORVASC) 10 MG tablet Take 1 tablet (10 mg total) by mouth daily.  30 tablet  11  . beclomethasone (QVAR) 80 MCG/ACT inhaler Inhale 1 puff into the lungs 2 (two) times daily.  1 Inhaler  11  . diclofenac sodium (VOLTAREN) 1 % GEL Apply three times to four times per day       . ibuprofen (ADVIL,MOTRIN) 400 MG tablet TAKE 1 TABLET BY MOUTH EVERY 6 HOURS AS NEEDED FOR PAIN  120 tablet  4  . metFORMIN (GLUCOPHAGE) 500 MG tablet Take 500 mg by mouth daily.        . montelukast (SINGULAIR) 10 MG tablet Take 10 mg by mouth daily.        . nebivolol (BYSTOLIC) 10 MG tablet Take 10 mg by mouth daily.        Marland Kitchen olmesartan-hydrochlorothiazide (BENICAR HCT) 40-25 MG per tablet Take  1 tablet by mouth daily.        . simvastatin (ZOCOR) 40 MG tablet 1/2 by mouth once daily       . zolpidem (AMBIEN) 10 MG tablet Take 10 mg by mouth at bedtime as needed.         Review of Systems Review of Systems  Constitutional: Negative for diaphoresis and unexpected weight change.  HENT: Negative for drooling and tinnitus.   Eyes: Negative for photophobia and visual disturbance.  Respiratory: Negative for choking and stridor.   Gastrointestinal: Negative for vomiting and blood in stool.  Genitourinary: Negative for hematuria and decreased urine volume.       Objective:   Physical Exam BP 150/90  Pulse 83  Temp(Src) 97.9 F (36.6 C) (Oral)  Ht 5\' 3"  (1.6 m)  Wt 187 lb 8 oz (85.049 kg)  BMI  33.21 kg/m2  SpO2 97% Physical Exam  VS noted Constitutional: Pt appears well-developed and well-nourished.  HENT: Head: Normocephalic.  Right Ear: External ear normal.  Left Ear: External ear normal.  Eyes: Conjunctivae and EOM are normal. Pupils are equal, round, and reactive to light.  Neck: Normal range of motion. Neck supple.  Cardiovascular: Normal rate and regular rhythm.   Pulmonary/Chest: Effort normal and breath sounds normal.  Abd:  Soft, NT, non-distended, + BS Neurological: Pt is alert. No cranial nerve deficit.  Skin: Skin is warm. No erythema.  Psychiatric: Pt behavior is normal. Thought content normal.         Assessment & Plan:

## 2011-01-15 NOTE — Assessment & Plan Note (Signed)
Resolved, Continue all other medications as before  

## 2011-01-15 NOTE — Assessment & Plan Note (Signed)
For repeat cbc today, note that she has had recent steroid tx as well

## 2011-01-15 NOTE — Patient Instructions (Signed)
Continue all other medications as before Please go to LAB in the Basement for the blood and/or urine tests to be done today Please return in 6 mo with Lab testing done 3-5 days before

## 2011-01-15 NOTE — Assessment & Plan Note (Signed)
stable overall by hx and exam, most recent data reviewed with pt, and pt to continue medical treatment as before  Lab Results  Component Value Date   HGBA1C 6.3 01/02/2011    

## 2011-01-17 ENCOUNTER — Other Ambulatory Visit: Payer: Self-pay | Admitting: Internal Medicine

## 2011-02-15 ENCOUNTER — Other Ambulatory Visit: Payer: Self-pay | Admitting: Internal Medicine

## 2011-03-20 ENCOUNTER — Other Ambulatory Visit: Payer: Self-pay | Admitting: Internal Medicine

## 2011-04-18 ENCOUNTER — Ambulatory Visit (INDEPENDENT_AMBULATORY_CARE_PROVIDER_SITE_OTHER): Payer: BC Managed Care – PPO

## 2011-04-18 DIAGNOSIS — Z23 Encounter for immunization: Secondary | ICD-10-CM

## 2011-04-25 ENCOUNTER — Other Ambulatory Visit: Payer: Self-pay | Admitting: Internal Medicine

## 2011-05-08 ENCOUNTER — Telehealth: Payer: Self-pay

## 2011-05-08 NOTE — Telephone Encounter (Signed)
Call pt - ? Ok to change to diovan?

## 2011-05-08 NOTE — Telephone Encounter (Signed)
Fax received from pharmacy requesting PA on Benicar, pt must try and fail on Losartan Diovan Micardis Exforge or Eprosartan. Please advise, okay to change to covered medication or proceed with PA?

## 2011-05-09 MED ORDER — VALSARTAN-HYDROCHLOROTHIAZIDE 320-25 MG PO TABS: 1.0000 | ORAL_TABLET | Freq: Every day | ORAL | Status: DC

## 2011-05-09 NOTE — Telephone Encounter (Signed)
Done per emr 

## 2011-05-09 NOTE — Telephone Encounter (Signed)
Pt is agreeable to medication change

## 2011-07-18 ENCOUNTER — Other Ambulatory Visit (INDEPENDENT_AMBULATORY_CARE_PROVIDER_SITE_OTHER): Payer: BC Managed Care – PPO

## 2011-07-18 ENCOUNTER — Encounter: Payer: Self-pay | Admitting: Internal Medicine

## 2011-07-18 ENCOUNTER — Ambulatory Visit (INDEPENDENT_AMBULATORY_CARE_PROVIDER_SITE_OTHER): Payer: BC Managed Care – PPO | Admitting: Internal Medicine

## 2011-07-18 VITALS — BP 152/100 | HR 102 | Temp 98.3°F | Ht 63.0 in | Wt 178.5 lb

## 2011-07-18 DIAGNOSIS — J029 Acute pharyngitis, unspecified: Secondary | ICD-10-CM | POA: Insufficient documentation

## 2011-07-18 DIAGNOSIS — Z0000 Encounter for general adult medical examination without abnormal findings: Secondary | ICD-10-CM

## 2011-07-18 DIAGNOSIS — I10 Essential (primary) hypertension: Secondary | ICD-10-CM

## 2011-07-18 DIAGNOSIS — E119 Type 2 diabetes mellitus without complications: Secondary | ICD-10-CM

## 2011-07-18 LAB — MICROALBUMIN / CREATININE URINE RATIO
Microalb Creat Ratio: 1 mg/g (ref 0.0–30.0)
Microalb, Ur: 0.9 mg/dL (ref 0.0–1.9)

## 2011-07-18 LAB — BASIC METABOLIC PANEL
GFR: 72.85 mL/min (ref >60.00)
Glucose, Bld: 91 mg/dL (ref 70–99)
Potassium: 4 mEq/L (ref 3.5–5.1)
Sodium: 142 mEq/L (ref 135–145)

## 2011-07-18 LAB — CBC WITH DIFFERENTIAL/PLATELET
Basophils Absolute: 0 10*3/uL (ref 0.0–0.1)
Eosinophils Absolute: 0.1 10*3/uL (ref 0.0–0.7)
HCT: 39 % (ref 36.0–46.0)
Lymphs Abs: 2.1 10*3/uL (ref 0.7–4.0)
MCHC: 33 g/dL (ref 30.0–36.0)
MCV: 82.9 fl (ref 78.0–100.0)
Monocytes Absolute: 1.1 10*3/uL — ABNORMAL HIGH (ref 0.1–1.0)
Monocytes Relative: 13.9 % — ABNORMAL HIGH (ref 3.0–12.0)
Platelets: 243 10*3/uL (ref 150.0–400.0)
RDW: 14.8 % — ABNORMAL HIGH (ref 11.5–14.6)

## 2011-07-18 LAB — URINALYSIS, ROUTINE W REFLEX MICROSCOPIC
Bilirubin Urine: NEGATIVE
Hgb urine dipstick: NEGATIVE
Ketones, ur: NEGATIVE
Leukocytes, UA: NEGATIVE
Nitrite: NEGATIVE
Urobilinogen, UA: 0.2 (ref 0.0–1.0)

## 2011-07-18 LAB — LIPID PANEL: VLDL: 15.2 mg/dL (ref 0.0–40.0)

## 2011-07-18 LAB — HEPATIC FUNCTION PANEL
AST: 15 U/L (ref 0–37)
Albumin: 4.1 g/dL (ref 3.5–5.2)
Alkaline Phosphatase: 99 U/L (ref 39–117)

## 2011-07-18 LAB — HEMOGLOBIN A1C: Hgb A1c MFr Bld: 5.7 % (ref 4.6–6.5)

## 2011-07-18 LAB — TSH: TSH: 1.81 u[IU]/mL (ref 0.35–5.50)

## 2011-07-18 MED ORDER — OLMESARTAN-AMLODIPINE-HCTZ 40-10-25 MG PO TABS: 1.0000 | ORAL_TABLET | Freq: Every day | ORAL | Status: DC

## 2011-07-18 MED ORDER — DOXYCYCLINE HYCLATE 100 MG PO TABS: 100.0000 mg | ORAL_TABLET | Freq: Two times a day (BID) | ORAL | Status: AC

## 2011-07-18 MED ORDER — ZOLPIDEM TARTRATE 10 MG PO TABS: 10.0000 mg | ORAL_TABLET | Freq: Every evening | ORAL | Status: DC | PRN

## 2011-07-18 NOTE — Patient Instructions (Addendum)
Take all new medications as prescribed - the antibiotic (sent to your pharmacy) Continue all other medications as before Please use the samples of the Benicar HCT 40/10 mg  - 1 per day with the amlodipine you have at home until the amlodipine is out Then stop the amlodipine and the Benicar HCT samples, and start the Tribenzor 40/10/25 mg - 1 per day (you are given the discount card to help with the cost) Please go to LAB in the Basement for the blood and/or urine tests to be done today Please call the phone number [phone] (the [employer] System) for results of testing in 2-3 days;  When calling, simply dial the number, and when prompted enter the MRN number above (the Medical Record Number) and the # key, then the message should start. Please remember to followup with your GYN for the yearly pap smear and/or mammogram as you do Please have your pharmacy call if you need further refills of your medicaitons Please return in 6 mo with Lab testing done 3-5 days before

## 2011-07-19 ENCOUNTER — Encounter: Payer: Self-pay | Admitting: Internal Medicine

## 2011-07-19 NOTE — Assessment & Plan Note (Addendum)
Mild elev overall by hx and exam and difficulty affording meds - not taking the diovan hct, most recent data reviewed with pt, and pt to continue medical treatment as before except meds adjusted to tribenzor with sample and discount card today BP Readings from Last 3 Encounters:  07/18/11 152/100  01/15/11 150/90  01/03/11 142/90

## 2011-07-19 NOTE — Progress Notes (Signed)
Subjective:    Patient ID: Brooke [name], female    DOB: [birth year], 60 y.o.   MRN: [id number]  HPI  Here for wellness and f/u;  Overall doing ok;  Pt denies CP, worsening SOB, DOE, wheezing, orthopnea, PND, worsening LE edema, palpitations, dizziness or syncope.  Pt denies neurological change such as new Headache, facial or extremity weakness.  Pt denies polydipsia, polyuria, or low sugar symptoms. Pt states overall good compliance with treatment and medications, good tolerability, and trying to follow lower cholesterol diet.  Pt denies worsening depressive symptoms, suicidal ideation or panic. No fever, wt loss, night sweats, loss of appetite, or other constitutional symptoms.  Pt states good ability with ADL's, low fall risk, home safety reviewed and adequate, no significant changes in hearing or vision, and occasionally active with exercise.  Unfortunately also Here with 3 days acute onset fever, severe ST, pressure, general weakness and malaise, but little to no cough.   Past Medical History  Diagnosis Date  . ALOPECIA 12/30/2007  . ANEMIA-NOS 02/09/2008  . CHEST PAIN 02/28/2009  . COLD SORE 10/01/2007  . COLONIC POLYPS, HX OF 02/26/2007  . DIABETES MELLITUS, TYPE II 11/29/2008  . ECZEMA, ATOPIC DERMATITIS 09/19/2006  . FATIGUE 09/05/2009  . GERD 02/09/2008  . GOITER NOS 09/19/2006  . GOUT 08/09/2008  . HYPERLIPIDEMIA 10/01/2007  . HYPERTENSION, BENIGN SYSTEMIC 09/19/2006  . HYPERTENSION 02/26/2007  . INSOMNIA NOS 09/19/2006  . Other abnormal glucose 10/01/2007  . OTITIS EXTERNA, RIGHT 02/28/2009  . Pain in joint, lower leg 10/01/2007  . SINUSITIS- ACUTE-NOS 05/20/2009  . SKIN LESION 08/09/2008  . Unspecified asthma 09/19/2006  . Urinary frequency 11/29/2008  . Wheezing 12/30/2009  . Impaired glucose tolerance 12/19/2010   Past Surgical History  Procedure Date  . Cholecystectomy 3.1.2004  . Abdominal hysterectomy 10/22/1999    BSO  . L ras re-stented - 03/08/2005  . L ras tx'd w/angioplasty 11/21/2003  .  Left cataract surgury   . Radioactive iodine ablate 3/03    thyroid 10/07/2001  . Rastelli procedure 6/98 neg  . Split sleep study neg for sleep apnea 11/15/2003    reports that she has never smoked. She does not have any smokeless tobacco history on file. She reports that she does not drink alcohol or use illicit drugs. family history includes Diabetes in her mother. Allergies  Allergen Reactions  . Ace Inhibitors     REACTION: Patient denies allery  . Codeine     REACTION: rash nausea  . Tussionex Pennkinetic Er     REACTION: vomiting   Current Outpatient Prescriptions on File Prior to Visit  Medication Sig Dispense Refill  . albuterol (PROVENTIL,VENTOLIN) 90 MCG/ACT inhaler Inhale 2 puffs into the lungs every 6 (six) hours as needed.  17 g  5  . ALPRAZolam (XANAX) 0.25 MG tablet Take 0.25 mg by mouth 2 (two) times daily as needed.        . beclomethasone (QVAR) 80 MCG/ACT inhaler Inhale 1 puff into the lungs 2 (two) times daily.  1 Inhaler  11  . diclofenac sodium (VOLTAREN) 1 % GEL Apply three times to four times per day       . ibuprofen (ADVIL,MOTRIN) 400 MG tablet TAKE 1 TABLET BY MOUTH EVERY 6 HOURS AS NEEDED FOR PAIN  120 tablet  4  . metFORMIN (GLUCOPHAGE) 500 MG tablet TAKE 1 TABLET BY MOUTH EVERY DAY  90 tablet  3  . montelukast (SINGULAIR) 10 MG tablet Take 10 mg by mouth daily.        Marland Kitchen  omeprazole (PRILOSEC) 20 MG capsule TAKE ONE CAPSULE BY MOUTH TWICE DAILY  90 capsule  3  . simvastatin (ZOCOR) 40 MG tablet TAKE 1 TABLET BY MOUTH EVERY DAY  90 tablet  3  . nebivolol (BYSTOLIC) 10 MG tablet Take 10 mg by mouth daily.         Review of Systems Review of Systems  Constitutional: Negative for diaphoresis, activity change, appetite change and unexpected weight change.  HENT: Negative for hearing loss, ear pain, facial swelling, mouth sores and neck stiffness.   Eyes: Negative for pain, redness and visual disturbance.  Respiratory: Negative for shortness of breath and  wheezing.   Cardiovascular: Negative for chest pain and palpitations.  Gastrointestinal: Negative for diarrhea, blood in stool, abdominal distention and rectal pain.  Genitourinary: Negative for hematuria, flank pain and decreased urine volume.  Musculoskeletal: Negative for myalgias and joint swelling.  Skin: Negative for color change and wound.  Neurological: Negative for syncope and numbness.  Hematological: Negative for adenopathy.  Psychiatric/Behavioral: Negative for hallucinations, self-injury, decreased concentration and agitation.      Objective:   Physical Exam BP 152/100  Pulse 102  Temp(Src) 98.3 F (36.8 C) (Oral)  Ht 5\' 3"  (1.6 m)  Wt 178 lb 8 oz (80.967 kg)  BMI 31.62 kg/m2  SpO2 96% Physical Exam  VS noted, mild ill Constitutional: Pt is oriented to person, place, and time. Appears well-developed and well-nourished.  HENT:  Head: Normocephalic and atraumatic.  Right Ear: External ear normal.  Left Ear: External ear normal.  Nose: Nose normal.  Mouth/Throat: Oropharynx is clear and moist. Bilat tm's mild erythema.  Sinus nontender.  Pharynx severe erythema with slight exudate  Eyes: Conjunctivae and EOM are normal. Pupils are equal, round, and reactive to light.  Neck: Normal range of motion. Neck supple. No JVD present. No tracheal deviation present.  Cardiovascular: Normal rate, regular rhythm, normal heart sounds and intact distal pulses.   Pulmonary/Chest: Effort normal and breath sounds normal.  Abdominal: Soft. Bowel sounds are normal. There is no tenderness.  Musculoskeletal: Normal range of motion. Exhibits no edema.  Lymphadenopathy:  Has no cervical adenopathy.  Neurological: Pt is alert and oriented to person, place, and time. Pt has normal reflexes. No cranial nerve deficit.  Skin: Skin is warm and dry. No rash noted.  Psychiatric:  Has  normal mood and affect. Behavior is normal. except 1+ nervous    Assessment & Plan:

## 2011-07-19 NOTE — Assessment & Plan Note (Signed)
stable overall by hx and exam, most recent data reviewed with pt, and pt to continue medical treatment as before Lab Results  Component Value Date   HGBA1C 5.7 07/18/2011

## 2011-07-19 NOTE — Assessment & Plan Note (Signed)

## 2011-07-19 NOTE — Assessment & Plan Note (Signed)
Mild to mod, for antibx course,  to f/u any worsening symptoms or concerns 

## 2011-09-04 ENCOUNTER — Encounter: Payer: Self-pay | Admitting: Internal Medicine

## 2011-11-29 ENCOUNTER — Other Ambulatory Visit: Payer: Self-pay | Admitting: Internal Medicine

## 2011-12-31 ENCOUNTER — Other Ambulatory Visit (INDEPENDENT_AMBULATORY_CARE_PROVIDER_SITE_OTHER): Payer: BC Managed Care – PPO

## 2011-12-31 DIAGNOSIS — E119 Type 2 diabetes mellitus without complications: Secondary | ICD-10-CM

## 2011-12-31 LAB — BASIC METABOLIC PANEL
BUN: 12 mg/dL (ref 6–23)
Calcium: 9.6 mg/dL (ref 8.4–10.5)
Chloride: 103 mEq/L (ref 96–112)
Creatinine, Ser: 0.9 mg/dL (ref 0.4–1.2)

## 2011-12-31 LAB — LIPID PANEL
Cholesterol: 146 mg/dL (ref 0–200)
HDL: 57 mg/dL (ref >39.00)
LDL Cholesterol: 79 mg/dL (ref 0–99)
Total CHOL/HDL Ratio: 3
Triglycerides: 51 mg/dL (ref 0.0–149.0)

## 2012-01-14 ENCOUNTER — Other Ambulatory Visit: Payer: Self-pay | Admitting: Internal Medicine

## 2012-01-14 DIAGNOSIS — Z1231 Encounter for screening mammogram for malignant neoplasm of breast: Secondary | ICD-10-CM

## 2012-01-16 ENCOUNTER — Ambulatory Visit: Payer: BC Managed Care – PPO | Admitting: Internal Medicine

## 2012-01-30 ENCOUNTER — Other Ambulatory Visit: Payer: Self-pay | Admitting: Internal Medicine

## 2012-02-08 ENCOUNTER — Ambulatory Visit
Admission: RE | Admit: 2012-02-08 | Discharge: 2012-02-08 | Disposition: A | Discharge status: Home / Self Care | Payer: BC Managed Care – PPO | Source: Ambulatory Visit | Attending: Internal Medicine | Admitting: Internal Medicine

## 2012-02-08 DIAGNOSIS — Z1231 Encounter for screening mammogram for malignant neoplasm of breast: Secondary | ICD-10-CM

## 2012-02-13 ENCOUNTER — Ambulatory Visit (INDEPENDENT_AMBULATORY_CARE_PROVIDER_SITE_OTHER): Payer: BC Managed Care – PPO | Admitting: Internal Medicine

## 2012-02-13 ENCOUNTER — Encounter: Payer: Self-pay | Admitting: Internal Medicine

## 2012-02-13 VITALS — BP 154/90 | HR 93 | Temp 97.3°F | Ht 63.0 in | Wt 184.0 lb

## 2012-02-13 DIAGNOSIS — E119 Type 2 diabetes mellitus without complications: Secondary | ICD-10-CM

## 2012-02-13 DIAGNOSIS — M1710 Unilateral primary osteoarthritis, unspecified knee: Secondary | ICD-10-CM

## 2012-02-13 DIAGNOSIS — I10 Essential (primary) hypertension: Secondary | ICD-10-CM

## 2012-02-13 DIAGNOSIS — E785 Hyperlipidemia, unspecified: Secondary | ICD-10-CM

## 2012-02-13 DIAGNOSIS — Z0000 Encounter for general adult medical examination without abnormal findings: Secondary | ICD-10-CM

## 2012-02-13 MED ORDER — TRAMADOL HCL 50 MG PO TABS: ORAL_TABLET | ORAL | Status: DC

## 2012-02-13 NOTE — Patient Instructions (Addendum)
Take all new medications as prescribed - the pain medication Continue all other medications as before Please call if you wish to be referred to orthopedic Please return in 6 mo with Lab testing done 3-5 days before

## 2012-02-13 NOTE — Progress Notes (Signed)
Subjective:    Patient ID: Brooke [name], female    DOB: 25-Apr-1951, 61 y.o.   MRN: [id number]  HPI  Here to f/u; works as CNA on night shift (sleepy now, usually in bed this time of day), c/o 1-2 wk increased bilat knee pain and swelling, a bit worse on the left than the right, without trauma, fever, or hx of gout; worse to stand and walk all night at work, better to sit and has been taking ibuprofen for help as well without apparent GI upset or other problems.  Unfortunately became too severe and missed 2 days of work, though did work last night.  Has seen orthopedic in the past,  Does not want to see new ortho but cannot see her ortho as she owed $250.  Also -  overall otherwise doing ok,  Pt denies chest pain, increased sob or doe, wheezing, orthopnea, PND, increased LE swelling, palpitations, dizziness or syncope.  Pt denies new neurological symptoms such as new headache, or facial or extremity weakness or numbness   Pt denies polydipsia, polyuria, or low sugar symptoms such as weakness or confusion improved with po intake.  Pt states overall good compliance with meds, trying to follow lower cholesterol, diabetic diet, wt overall stable but little exercise howeverl, even less now with her knee pain.  Of note, did miss her BP med last evening, but BP at home has been < 140/90 per pt Past Medical History  Diagnosis Date  . ALOPECIA 12/30/2007  . ANEMIA-NOS 02/09/2008  . CHEST PAIN 02/28/2009  . COLD SORE 10/01/2007  . COLONIC POLYPS, HX OF 02/26/2007  . DIABETES MELLITUS, TYPE II 11/29/2008  . ECZEMA, ATOPIC DERMATITIS 09/19/2006  . FATIGUE 09/05/2009  . GERD 02/09/2008  . GOITER NOS 09/19/2006  . GOUT 08/09/2008  . HYPERLIPIDEMIA 10/01/2007  . HYPERTENSION, BENIGN SYSTEMIC 09/19/2006  . HYPERTENSION 02/26/2007  . INSOMNIA NOS 09/19/2006  . Other abnormal glucose 10/01/2007  . OTITIS EXTERNA, RIGHT 02/28/2009  . Pain in joint, lower leg 10/01/2007  . SINUSITIS- ACUTE-NOS 05/20/2009  . SKIN LESION 08/09/2008    . Unspecified asthma 09/19/2006  . Urinary frequency 11/29/2008  . Wheezing 12/30/2009  . Impaired glucose tolerance 12/19/2010   Past Surgical History  Procedure Date  . Cholecystectomy 3.1.2004  . Abdominal hysterectomy 10/22/1999    BSO  . L ras re-stented - 03/08/2005  . L ras tx'd w/angioplasty 11/21/2003  . Left cataract surgury   . Radioactive iodine ablate 3/03    thyroid 10/07/2001  . Rastelli procedure 6/98 neg  . Split sleep study neg for sleep apnea 11/15/2003    reports that she has never smoked. She does not have any smokeless tobacco history on file. She reports that she does not drink alcohol or use illicit drugs. family history includes Diabetes in her mother. Allergies  Allergen Reactions  . Ace Inhibitors     REACTION: Patient denies allery  . Codeine     REACTION: rash nausea  . Hydrocod Polst-Cpm Polst Er     REACTION: vomiting   Current Outpatient Prescriptions on File Prior to Visit  Medication Sig Dispense Refill  . albuterol (PROVENTIL,VENTOLIN) 90 MCG/ACT inhaler Inhale 2 puffs into the lungs every 6 (six) hours as needed.  17 g  5  . ALPRAZolam (XANAX) 0.25 MG tablet Take 0.25 mg by mouth 2 (two) times daily as needed.        . beclomethasone (QVAR) 80 MCG/ACT inhaler Inhale 1 puff into the lungs 2 (  two) times daily.  1 Inhaler  11  . diclofenac sodium (VOLTAREN) 1 % GEL Apply three times to four times per day       . ibuprofen (ADVIL,MOTRIN) 400 MG tablet TAKE 1 TABLET BY MOUTH EVERY 6 HOURS AS NEEDED FOR PAIN  120 tablet  4  . metFORMIN (GLUCOPHAGE) 500 MG tablet TAKE 1 TABLET BY MOUTH EVERY DAY  90 tablet  1  . montelukast (SINGULAIR) 10 MG tablet Take 10 mg by mouth daily.        Marland Kitchen omeprazole (PRILOSEC) 20 MG capsule TAKE ONE CAPSULE BY MOUTH TWICE DAILY  90 capsule  3  . simvastatin (ZOCOR) 40 MG tablet TAKE 1 TABLET BY MOUTH EVERY DAY  90 tablet  3  . zolpidem (AMBIEN) 10 MG tablet Take 1 tablet (10 mg total) by mouth at bedtime as needed.  30 tablet  5   . nebivolol (BYSTOLIC) 10 MG tablet Take 10 mg by mouth daily.        . Olmesartan-Amlodipine-HCTZ (TRIBENZOR) 40-10-25 MG TABS Take 1 tablet by mouth daily.  90 tablet  3   Review of Systems Review of Systems  Constitutional: Negative for diaphoresis and unexpected weight change.  HENT: Negative for tinnitus.   Eyes: Negative for photophobia and visual disturbance.  Respiratory: Negative for choking and stridor.   Gastrointestinal: Negative for vomiting and blood in stool.  Genitourinary: Negative for hematuria and decreased urine volume.  Musculoskeletal: Negative for other joint swelling than above, no knee giveaways, no falls Skin: Negative for color change and wound.  Neurological: Negative for tremors and numbness.  Psychiatric/Behavioral: Negative for decreased concentration.     Objective:   Physical Exam BP 154/90  Pulse 93  Temp 97.3 F (36.3 C) (Oral)  Ht 5\' 3"  (1.6 m)  Wt 184 lb (83.462 kg)  BMI 32.59 kg/m2  SpO2 98% Physical Exam  VS noted Constitutional: Pt appears well-developed and well-nourished.  HENT: Head: Normocephalic.  Right Ear: External ear normal.  Left Ear: External ear normal.  Eyes: Conjunctivae and EOM are normal. Pupils are equal, round, and reactive to light.  Neck: Normal range of motion. Neck supple.  Cardiovascular: Normal rate and regular rhythm.   Pulmonary/Chest: Effort normal and breath sounds normal.  Neurological: Pt is alert. Not confused Left knee with mild valgus deformity, 1-2+ effusion, warmth medially with crepitus, o/w NT and FROM Right knee without deformity, but mild osteophytic medial aspect with warmth with mild tender, mild crepitus, FROM, trace effusion Skin: Skin is warm. No erythema. LE trace leg edema below knees left slight > right Psychiatric: Pt behavior is normal. Thought content normal. 1+ nervous    Assessment & Plan:

## 2012-02-13 NOTE — Assessment & Plan Note (Signed)
stable overall by hx and exam, most recent data reviewed with pt, and pt to continue medical treatment as before Lab Results  Component Value Date   LDLCALC 79 12/31/2011

## 2012-02-13 NOTE — Assessment & Plan Note (Signed)
stable overall by hx and exam, most recent data reviewed with pt, and pt to continue medical treatment as before Lab Results  Component Value Date   HGBA1C 5.9 12/31/2011

## 2012-02-13 NOTE — Assessment & Plan Note (Signed)
Has beenn consistently elevatedhere, but per pt has been better at home and work, and otherwise stable overall by hx and exam, most recent data reviewed with pt, and pt to continue medical treatment as before - declines change in med at this time BP Readings from Last 3 Encounters:  02/13/12 154/90  07/18/11 152/100  01/15/11 150/90

## 2012-02-13 NOTE — Assessment & Plan Note (Signed)
bilat , left > right with mild deformity with larger effusion, and now mod to severe pain,  Declines films or ortho referral at this time due to cost though I think she would benefit from cortisone or other such as synvisc, for now for tramadol prn, pt to call if not working

## 2012-03-06 ENCOUNTER — Other Ambulatory Visit: Payer: Self-pay | Admitting: Internal Medicine

## 2012-04-03 ENCOUNTER — Encounter: Payer: Self-pay | Admitting: *Deleted

## 2012-04-03 ENCOUNTER — Ambulatory Visit (INDEPENDENT_AMBULATORY_CARE_PROVIDER_SITE_OTHER): Payer: BC Managed Care – PPO | Admitting: *Deleted

## 2012-04-03 DIAGNOSIS — Z23 Encounter for immunization: Secondary | ICD-10-CM

## 2012-04-13 ENCOUNTER — Encounter (HOSPITAL_COMMUNITY): Payer: Self-pay | Admitting: *Deleted

## 2012-04-13 ENCOUNTER — Emergency Department (HOSPITAL_COMMUNITY)
Admission: EM | Admit: 2012-04-13 | Discharge: 2012-04-13 | Disposition: A | Discharge status: Home / Self Care | Payer: BC Managed Care – PPO | Attending: Emergency Medicine | Admitting: Emergency Medicine

## 2012-04-13 ENCOUNTER — Emergency Department (HOSPITAL_COMMUNITY): Payer: BC Managed Care – PPO

## 2012-04-13 DIAGNOSIS — M25569 Pain in unspecified knee: Secondary | ICD-10-CM

## 2012-04-13 DIAGNOSIS — W19XXXA Unspecified fall, initial encounter: Secondary | ICD-10-CM | POA: Insufficient documentation

## 2012-04-13 DIAGNOSIS — J45909 Unspecified asthma, uncomplicated: Secondary | ICD-10-CM | POA: Insufficient documentation

## 2012-04-13 DIAGNOSIS — E119 Type 2 diabetes mellitus without complications: Secondary | ICD-10-CM | POA: Insufficient documentation

## 2012-04-13 DIAGNOSIS — S99929A Unspecified injury of unspecified foot, initial encounter: Secondary | ICD-10-CM | POA: Insufficient documentation

## 2012-04-13 DIAGNOSIS — I10 Essential (primary) hypertension: Secondary | ICD-10-CM | POA: Insufficient documentation

## 2012-04-13 DIAGNOSIS — M109 Gout, unspecified: Secondary | ICD-10-CM | POA: Insufficient documentation

## 2012-04-13 DIAGNOSIS — S8990XA Unspecified injury of unspecified lower leg, initial encounter: Secondary | ICD-10-CM | POA: Insufficient documentation

## 2012-04-13 DIAGNOSIS — K219 Gastro-esophageal reflux disease without esophagitis: Secondary | ICD-10-CM | POA: Insufficient documentation

## 2012-04-13 DIAGNOSIS — E785 Hyperlipidemia, unspecified: Secondary | ICD-10-CM | POA: Insufficient documentation

## 2012-04-13 DIAGNOSIS — Z886 Allergy status to analgesic agent status: Secondary | ICD-10-CM | POA: Insufficient documentation

## 2012-04-13 MED ORDER — IBUPROFEN 800 MG PO TABS: 800.0000 mg | ORAL_TABLET | Freq: Three times a day (TID) | ORAL | Status: DC | PRN

## 2012-04-13 MED ORDER — HYDROCODONE-ACETAMINOPHEN 5-325 MG PO TABS: 1.0000 | ORAL_TABLET | Freq: Four times a day (QID) | ORAL | Status: DC | PRN

## 2012-04-13 NOTE — ED Notes (Signed)
Pt fell Sept 4th, has  Left knee pain, tenderness, swelling

## 2012-04-13 NOTE — ED Provider Notes (Signed)
Medical screening examination/treatment/procedure(s) were performed by non-physician practitioner and as supervising physician I was immediately available for consultation/collaboration.  Aarionna Germer T Zanyla Klebba, MD 04/13/12 1502 

## 2012-04-13 NOTE — ED Provider Notes (Signed)
History     CSN: [id number]  Arrival date & time 04/13/12  1021   First MD Initiated Contact with Patient 04/13/12 1112      Chief Complaint  Patient presents with  . Knee Injury    (Consider location/radiation/quality/duration/timing/severity/associated sxs/prior treatment) HPI The patient presents to the emergency department with left knee pain that began 3 weeks ago, following a fall.  Patient, states, that the knee hurts on the outside portion.  Patient denies numbness, weakness, nausea, vomiting, wounds, or fever.  Patient, states she's taken over-the-counter medications for her discomfort without relief.  Patient, states, that the pain is worse with movement and palpation.  Past Medical History  Diagnosis Date  . ALOPECIA 12/30/2007  . ANEMIA-NOS 02/09/2008  . CHEST PAIN 02/28/2009  . COLD SORE 10/01/2007  . COLONIC POLYPS, HX OF 02/26/2007  . DIABETES MELLITUS, TYPE II 11/29/2008  . ECZEMA, ATOPIC DERMATITIS 09/19/2006  . FATIGUE 09/05/2009  . GERD 02/09/2008  . GOITER NOS 09/19/2006  . GOUT 08/09/2008  . HYPERLIPIDEMIA 10/01/2007  . HYPERTENSION, BENIGN SYSTEMIC 09/19/2006  . HYPERTENSION 02/26/2007  . INSOMNIA NOS 09/19/2006  . Other abnormal glucose 10/01/2007  . OTITIS EXTERNA, RIGHT 02/28/2009  . Pain in joint, lower leg 10/01/2007  . SINUSITIS- ACUTE-NOS 05/20/2009  . SKIN LESION 08/09/2008  . Unspecified asthma 09/19/2006  . Urinary frequency 11/29/2008  . Wheezing 12/30/2009  . Impaired glucose tolerance 12/19/2010    Past Surgical History  Procedure Date  . Cholecystectomy 3.1.2004  . Abdominal hysterectomy 10/22/1999    BSO  . L ras re-stented - 03/08/2005  . L ras tx'd w/angioplasty 11/21/2003  . Left cataract surgury   . Radioactive iodine ablate 3/03    thyroid 10/07/2001  . Rastelli procedure 6/98 neg  . Split sleep study neg for sleep apnea 11/15/2003    Family History  Problem Relation Age of Onset  . Diabetes Mother     History  Substance Use Topics  . Smoking  status: Never Smoker   . Smokeless tobacco: Not on file  . Alcohol Use: No    OB History    Grav Para Term Preterm Abortions TAB SAB Ect Mult Living                  Review of Systems All other systems negative except as documented in the HPI. All pertinent positives and negatives as reviewed in the HPI.  Allergies  Ace inhibitors; Codeine; and Hydrocod polst-cpm polst er  Home Medications   Current Outpatient Rx  Name Route Sig Dispense Refill  . ALBUTEROL 90 MCG/ACT IN AERS Inhalation Inhale 2 puffs into the lungs every 6 (six) hours as needed. 17 g 5  . ALPRAZOLAM 0.25 MG PO TABS Oral Take 0.25 mg by mouth 2 (two) times daily as needed.      . BECLOMETHASONE DIPROPIONATE 80 MCG/ACT IN AERS Inhalation Inhale 1 puff into the lungs 2 (two) times daily. 1 Inhaler 11  . DICLOFENAC SODIUM 1 % TD GEL  Apply three times to four times per day     . IBUPROFEN 400 MG PO TABS  TAKE 1 TABLET BY MOUTH EVERY 6 HOURS AS NEEDED FOR PAIN 120 tablet 4  . METFORMIN HCL 500 MG PO TABS  TAKE 1 TABLET BY MOUTH EVERY DAY 90 tablet 1  . MONTELUKAST SODIUM 10 MG PO TABS Oral Take 10 mg by mouth daily.      Marland Kitchen OLMESARTAN-AMLODIPINE-HCTZ 40-10-25 MG PO TABS Oral Take 1 tablet by  mouth daily. 90 tablet 3  . OMEPRAZOLE 20 MG PO CPDR  TAKE ONE CAPSULE BY MOUTH TWICE DAILY 90 capsule 3  . SIMVASTATIN 40 MG PO TABS  TAKE ONE TABLET BY MOUTH DAILY 90 tablet 3  . TRAMADOL HCL 50 MG PO TABS  1-2 tabs by mouth every 6-8 hours as needed for pain 120 tablet 2  . ZOLPIDEM TARTRATE 10 MG PO TABS Oral Take 1 tablet (10 mg total) by mouth at bedtime as needed. 30 tablet 5    BP 149/86  Pulse 97  Temp 98.3 F (36.8 C) (Oral)  Resp 18  SpO2 97%  Physical Exam  Constitutional: She appears well-developed and well-nourished.  Cardiovascular: Normal rate.   Pulmonary/Chest: Effort normal.  Musculoskeletal:       Left knee: She exhibits normal range of motion, no swelling, no effusion and no ecchymosis. tenderness  found. LCL tenderness noted.       Legs:   ED Course  Procedures (including critical care time)  Labs Reviewed - No data to display Dg Knee Complete 4 Views Left  04/13/2012  *RADIOLOGY REPORT*  Clinical Data: Knee injury post fall 2 weeks ago  LEFT KNEE - COMPLETE 4+ VIEW  Comparison: 04/23/2008  Findings: Four views of the left knee submitted.  No acute fracture or subluxation.  Small joint effusion.  Mild narrowing of medial joint compartment.  Mild spurring of medial femoral condyle. Narrowing of patellofemoral joint space.  Mild spurring of patella.  IMPRESSION: No acute fracture or subluxation.  Probable small joint effusion. Mild degenerative changes as described above.   Original Report Authenticated By: Natasha Mead, M.D.     Patient will be referred to orthopedics for further evaluation.  Patient does not have any significant findings on x-ray other than a small possible effusion.    MDM  MDM Reviewed: vitals and nursing note Interpretation: x-ray            Carlyle Dolly, PA-C 04/13/12 1215

## 2012-05-28 ENCOUNTER — Other Ambulatory Visit: Payer: Self-pay | Admitting: *Deleted

## 2012-05-28 MED ORDER — METFORMIN HCL 500 MG PO TABS: ORAL_TABLET | ORAL | Status: DC

## 2012-05-28 MED ORDER — OLMESARTAN-AMLODIPINE-HCTZ 40-10-25 MG PO TABS: 1.0000 | ORAL_TABLET | Freq: Every day | ORAL | Status: DC

## 2012-05-28 NOTE — Telephone Encounter (Signed)
R'cd fax from [facility] for refill of Tribenzor and Metformin.

## 2012-06-26 ENCOUNTER — Other Ambulatory Visit: Payer: Self-pay | Admitting: Internal Medicine

## 2012-07-28 ENCOUNTER — Other Ambulatory Visit: Payer: Self-pay | Admitting: Internal Medicine

## 2012-08-05 ENCOUNTER — Other Ambulatory Visit (INDEPENDENT_AMBULATORY_CARE_PROVIDER_SITE_OTHER): Payer: BC Managed Care – PPO

## 2012-08-05 DIAGNOSIS — Z0000 Encounter for general adult medical examination without abnormal findings: Secondary | ICD-10-CM

## 2012-08-05 DIAGNOSIS — E119 Type 2 diabetes mellitus without complications: Secondary | ICD-10-CM

## 2012-08-05 LAB — BASIC METABOLIC PANEL
BUN: 15 mg/dL (ref 6–23)
CO2: 31 mEq/L (ref 19–32)
Calcium: 9.5 mg/dL (ref 8.4–10.5)
Creatinine, Ser: 1 mg/dL (ref 0.4–1.2)

## 2012-08-05 LAB — CBC WITH DIFFERENTIAL/PLATELET
Basophils Relative: 0.6 % (ref 0.0–3.0)
Eosinophils Absolute: 0 10*3/uL (ref 0.0–0.7)
Eosinophils Relative: 0.8 % (ref 0.0–5.0)
Hemoglobin: 12.7 g/dL (ref 12.0–15.0)
Lymphocytes Relative: 35.7 % (ref 12.0–46.0)
MCHC: 32.5 g/dL (ref 30.0–36.0)
Neutro Abs: 3.3 10*3/uL (ref 1.4–7.7)
Neutrophils Relative %: 52.6 % (ref 43.0–77.0)
RBC: 4.77 Mil/uL (ref 3.87–5.11)
WBC: 6.2 10*3/uL (ref 4.5–10.5)

## 2012-08-05 LAB — HEPATIC FUNCTION PANEL
Bilirubin, Direct: 0.1 mg/dL (ref 0.0–0.3)
Total Bilirubin: 0.5 mg/dL (ref 0.3–1.2)
Total Protein: 7.2 g/dL (ref 6.0–8.3)

## 2012-08-05 LAB — LIPID PANEL
Cholesterol: 149 mg/dL (ref 0–200)
HDL: 45.5 mg/dL (ref >39.00)
LDL Cholesterol: 87 mg/dL (ref 0–99)
Total CHOL/HDL Ratio: 3
Triglycerides: 82 mg/dL (ref 0.0–149.0)

## 2012-08-05 LAB — URINALYSIS, ROUTINE W REFLEX MICROSCOPIC
Nitrite: NEGATIVE
Specific Gravity, Urine: >=1.03 (ref 1.000–1.030)
Total Protein, Urine: NEGATIVE
Urine Glucose: NEGATIVE
pH: 6 (ref 5.0–8.0)

## 2012-08-18 ENCOUNTER — Ambulatory Visit (INDEPENDENT_AMBULATORY_CARE_PROVIDER_SITE_OTHER): Payer: BC Managed Care – PPO | Admitting: Internal Medicine

## 2012-08-18 ENCOUNTER — Encounter: Payer: Self-pay | Admitting: Internal Medicine

## 2012-08-18 VITALS — BP 158/90 | HR 95 | Temp 98.3°F | Ht 63.0 in | Wt 175.1 lb

## 2012-08-18 DIAGNOSIS — E876 Hypokalemia: Secondary | ICD-10-CM

## 2012-08-18 DIAGNOSIS — Z2911 Encounter for prophylactic immunotherapy for respiratory syncytial virus (RSV): Secondary | ICD-10-CM

## 2012-08-18 DIAGNOSIS — Z23 Encounter for immunization: Secondary | ICD-10-CM

## 2012-08-18 DIAGNOSIS — Z780 Asymptomatic menopausal state: Secondary | ICD-10-CM

## 2012-08-18 DIAGNOSIS — I10 Essential (primary) hypertension: Secondary | ICD-10-CM

## 2012-08-18 DIAGNOSIS — Z0000 Encounter for general adult medical examination without abnormal findings: Secondary | ICD-10-CM

## 2012-08-18 DIAGNOSIS — M109 Gout, unspecified: Secondary | ICD-10-CM

## 2012-08-18 MED ORDER — COLCHICINE 0.6 MG PO TABS: 0.6000 mg | ORAL_TABLET | Freq: Every day | ORAL | Status: DC

## 2012-08-18 MED ORDER — POTASSIUM CHLORIDE ER 10 MEQ PO TBCR: 10.0000 meq | EXTENDED_RELEASE_TABLET | Freq: Two times a day (BID) | ORAL | Status: DC

## 2012-08-18 NOTE — Assessment & Plan Note (Addendum)
Overall doing well, age appropriate education and counseling updated, referrals for preventative services and immunizations addressed, dietary and smoking counseling addressed, most recent labs reviewed.  I have personally reviewed and have noted: 1) the patient's medical and social history 2) The pt's use of alcohol, tobacco, and illicit drugs 3) The patient's current medications and supplements 4) Functional ability including ADL's, fall risk, home safety risk, hearing and visual impairment 5) Diet and physical activities 6) Evidence for depression or mood disorder 7) The patient's height, weight, and BMI have been recorded in the chart I have made referrals, and provided counseling and education based on review of the above Right ear irrigated today of wax as well.,  For shingle shot, and due for dxa f/u as well. ECG reviewed as per emr

## 2012-08-18 NOTE — Assessment & Plan Note (Signed)
To add daily colchicine,  to f/u any worsening symptoms or concerns

## 2012-08-18 NOTE — Assessment & Plan Note (Signed)
liekly due to hct - for klor con 10 qd

## 2012-08-18 NOTE — Assessment & Plan Note (Signed)
Mild elev today likely situational due to pain, to cont to monitor BP, cont same meds, f/u next visit BP Readings from Last 3 Encounters:  08/18/12 158/90  04/13/12 152/79  02/13/12 154/90

## 2012-08-18 NOTE — Patient Instructions (Addendum)
You had the shingles shot today Your right ear was irrigated of wax today Please take all new medication as prescribed - the colchicine for gout and pain (one per day), and the potassium pill Please continue all other medications as before, and refills have been done if requested. Please have the pharmacy call with any other refills you may need. Please continue your efforts at being more active, low cholesterol diet, and weight control. Please remember to followup with your GYN for the yearly pap smear and/or mammogram You are otherwise up to date with prevention measures today. Please remember to sign up for My Chart if you have not done so, as this will be important to you in the future with finding out test results, communicating by private email, and scheduling acute appointments online when needed. Please schedule the bone density test before leaving today at the scheduling desk (where you check out) Please return in 6 months, or sooner if needed

## 2012-08-18 NOTE — Addendum Note (Signed)
Addended by: Scharlene Gloss B on: 08/18/2012 11:31 AM   Modules accepted: Orders

## 2012-08-18 NOTE — Progress Notes (Signed)
Subjective:    Patient ID: Brooke [name], female    DOB: 1951-03-06, 62 y.o.   MRN: [id number]  HPI  Here for wellness and f/u;  Overall doing ok;  Pt denies CP, worsening SOB, DOE, wheezing, orthopnea, PND, worsening LE edema, palpitations, dizziness or syncope.  Pt denies neurological change such as new headache, facial or extremity weakness.  Pt denies polydipsia, polyuria, or low sugar symptoms. Pt states overall good compliance with treatment and medications, good tolerability, and has been trying to follow lower cholesterol diet.  Pt denies worsening depressive symptoms, suicidal ideation or panic. No fever, night sweats, wt loss, loss of appetite, or other constitutional symptoms.  Pt states good ability with ADL's, has low fall risk, home safety reviewed and adequate, no other significant changes in hearing or vision, and only occasionally active with exercise.  Has recurring gouty type pain to the feet, asks for preventive med, has been unable to tolerate tramadol and hydrocodone in the past due to Gi upset.Has some decresaed hearing to the right ear for the past wk - ? Wax.  Due for DXA Past Medical History  Diagnosis Date  . ALOPECIA 12/30/2007  . ANEMIA-NOS 02/09/2008  . CHEST PAIN 02/28/2009  . COLD SORE 10/01/2007  . COLONIC POLYPS, HX OF 02/26/2007  . DIABETES MELLITUS, TYPE II 11/29/2008  . ECZEMA, ATOPIC DERMATITIS 09/19/2006  . FATIGUE 09/05/2009  . GERD 02/09/2008  . GOITER NOS 09/19/2006  . GOUT 08/09/2008  . HYPERLIPIDEMIA 10/01/2007  . HYPERTENSION, BENIGN SYSTEMIC 09/19/2006  . HYPERTENSION 02/26/2007  . INSOMNIA NOS 09/19/2006  . Other abnormal glucose 10/01/2007  . OTITIS EXTERNA, RIGHT 02/28/2009  . Pain in joint, lower leg 10/01/2007  . SINUSITIS- ACUTE-NOS 05/20/2009  . SKIN LESION 08/09/2008  . Unspecified asthma 09/19/2006  . Urinary frequency 11/29/2008  . Wheezing 12/30/2009  . Impaired glucose tolerance 12/19/2010   Past Surgical History  Procedure Date  . Cholecystectomy  3.1.2004  . Abdominal hysterectomy 10/22/1999    BSO  . L ras re-stented - 03/08/2005  . L ras tx'd w/angioplasty 11/21/2003  . Left cataract surgury   . Radioactive iodine ablate 3/03    thyroid 10/07/2001  . Rastelli procedure 6/98 neg  . Split sleep study neg for sleep apnea 11/15/2003    reports that she has never smoked. She does not have any smokeless tobacco history on file. She reports that she does not drink alcohol or use illicit drugs. family history includes Diabetes in her mother. Allergies  Allergen Reactions  . Ace Inhibitors     REACTION: Patient denies allery  . Codeine     REACTION: rash nausea  . Hydrocodone     Dizzy and nausea  . Tramadol     n/v   Current Outpatient Prescriptions on File Prior to Visit  Medication Sig Dispense Refill  . albuterol (PROVENTIL,VENTOLIN) 90 MCG/ACT inhaler Inhale 2 puffs into the lungs every 6 (six) hours as needed.  17 g  5  . ALPRAZolam (XANAX) 0.25 MG tablet Take 0.25 mg by mouth 2 (two) times daily as needed.        . beclomethasone (QVAR) 80 MCG/ACT inhaler Inhale 1 puff into the lungs 2 (two) times daily.  1 Inhaler  11  . diclofenac sodium (VOLTAREN) 1 % GEL Apply three times to four times per day       . ibuprofen (ADVIL,MOTRIN) 400 MG tablet TAKE 1 TABLET BY MOUTH EVERY 6 HOURS AS NEEDED FOR PAIN  120 tablet  4  . ibuprofen (ADVIL,MOTRIN) 800 MG tablet Take 1 tablet (800 mg total) by mouth every 8 (eight) hours as needed for pain.  21 tablet  0  . metFORMIN (GLUCOPHAGE) 500 MG tablet TAKE 1 TABLET BY MOUTH EVERY DAY  90 tablet  1  . montelukast (SINGULAIR) 10 MG tablet Take 10 mg by mouth daily.        Marland Kitchen omeprazole (PRILOSEC) 20 MG capsule TAKE ONE CAPSULE BY MOUTH TWICE DAILY  90 capsule  0  . simvastatin (ZOCOR) 40 MG tablet TAKE ONE TABLET BY MOUTH DAILY  90 tablet  3  . TRIBENZOR 40-10-25 MG TABS TAKE 1 TABLET BY MOUTH EVERY DAY  90 tablet  1  . zolpidem (AMBIEN) 10 MG tablet Take 1 tablet (10 mg total) by mouth at bedtime  as needed.  30 tablet  5  . colchicine 0.6 MG tablet Take 1 tablet (0.6 mg total) by mouth daily.  90 tablet  3  . potassium chloride (KLOR-CON 10) 10 MEQ tablet Take 1 tablet (10 mEq total) by mouth 2 (two) times daily.  90 tablet  3   Review of Systems Constitutional: Negative for diaphoresis, activity change, appetite change or unexpected weight change.  HENT: Negative for hearing loss, ear pain, facial swelling, mouth sores and neck stiffness.   Eyes: Negative for pain, redness and visual disturbance.  Respiratory: Negative for shortness of breath and wheezing.   Cardiovascular: Negative for chest pain and palpitations.  Gastrointestinal: Negative for diarrhea, blood in stool, abdominal distention or other pain Genitourinary: Negative for hematuria, flank pain or change in urine volume.  Musculoskeletal: Negative for myalgias and joint swelling.  Skin: Negative for color change and wound.  Neurological: Negative for syncope and numbness. other than noted Hematological: Negative for adenopathy.  Psychiatric/Behavioral: Negative for hallucinations, self-injury, decreased concentration and agitation.      Objective:   Physical Exam BP 158/90  Pulse 95  Temp 98.3 F (36.8 C) (Oral)  Ht 5\' 3"  (1.6 m)  Wt 175 lb 2 oz (79.436 kg)  BMI 31.02 kg/m2  SpO2 92% VS noted,  Constitutional: Pt is oriented to person, place, and time. Appears well-developed and well-nourished.  Head: Normocephalic and atraumatic.  Right Ear: External ear normal.  Left Ear: External ear normal.  Nose: Nose normal.  Mouth/Throat: Oropharynx is clear and moist.  Eyes: Conjunctivae and EOM are normal. Pupils are equal, round, and reactive to light.  Neck: Normal range of motion. Neck supple. No JVD present. No tracheal deviation present.  Cardiovascular: Normal rate, regular rhythm, normal heart sounds and intact distal pulses.   Pulmonary/Chest: Effort normal and breath sounds normal.  Abdominal: Soft. Bowel  sounds are normal. There is no tenderness. No HSM  Musculoskeletal: Normal range of motion. Exhibits no edema.  Lymphadenopathy:  Has no cervical adenopathy.  Neurological: Pt is alert and oriented to person, place, and time. Pt has normal reflexes. No cranial nerve deficit.  Skin: Skin is warm and dry. No rash noted.  Psychiatric:  Has  normal mood and affect. Behavior is normal. mild nervous No synovitis noted today Right knee with valgus mild deformation, mild effusion, decreased ROM    Assessment & Plan:

## 2012-08-21 ENCOUNTER — Other Ambulatory Visit: Payer: BC Managed Care – PPO

## 2012-09-03 ENCOUNTER — Inpatient Hospital Stay: Admission: RE | Admit: 2012-09-03 | Payer: BC Managed Care – PPO | Source: Ambulatory Visit

## 2012-09-10 ENCOUNTER — Telehealth: Payer: Self-pay

## 2012-09-10 ENCOUNTER — Ambulatory Visit (INDEPENDENT_AMBULATORY_CARE_PROVIDER_SITE_OTHER)
Admission: RE | Admit: 2012-09-10 | Discharge: 2012-09-10 | Disposition: A | Discharge status: Home / Self Care | Payer: BC Managed Care – PPO | Source: Ambulatory Visit | Attending: Internal Medicine | Admitting: Internal Medicine

## 2012-09-10 DIAGNOSIS — Z780 Asymptomatic menopausal state: Secondary | ICD-10-CM

## 2012-09-10 MED ORDER — OMEPRAZOLE 20 MG PO CPDR: 20.0000 mg | DELAYED_RELEASE_CAPSULE | Freq: Two times a day (BID) | ORAL | Status: DC

## 2012-09-10 NOTE — Telephone Encounter (Signed)
rx filled

## 2012-09-15 ENCOUNTER — Encounter: Payer: Self-pay | Admitting: Internal Medicine

## 2012-09-21 ENCOUNTER — Other Ambulatory Visit: Payer: Self-pay | Admitting: Internal Medicine

## 2012-09-22 NOTE — Telephone Encounter (Signed)
Med filled.  

## 2012-11-09 ENCOUNTER — Other Ambulatory Visit: Payer: Self-pay | Admitting: Internal Medicine

## 2012-12-10 ENCOUNTER — Telehealth: Payer: Self-pay

## 2012-12-10 MED ORDER — METFORMIN HCL 500 MG PO TABS: ORAL_TABLET | ORAL | Status: DC

## 2012-12-10 NOTE — Telephone Encounter (Signed)
refill 

## 2013-01-13 LAB — HM MAMMOGRAPHY: HM Mammogram: NEGATIVE

## 2013-01-14 ENCOUNTER — Telehealth: Payer: Self-pay

## 2013-01-14 MED ORDER — SIMVASTATIN 40 MG PO TABS: 40.0000 mg | ORAL_TABLET | Freq: Every day | ORAL | Status: DC

## 2013-01-14 NOTE — Telephone Encounter (Signed)
refill 

## 2013-02-10 ENCOUNTER — Other Ambulatory Visit: Payer: Self-pay

## 2013-02-10 MED ORDER — METFORMIN HCL 500 MG PO TABS: ORAL_TABLET | ORAL | Status: DC

## 2013-02-10 MED ORDER — SIMVASTATIN 40 MG PO TABS: 40.0000 mg | ORAL_TABLET | Freq: Every day | ORAL | Status: DC

## 2013-02-11 ENCOUNTER — Other Ambulatory Visit: Payer: Self-pay | Admitting: Internal Medicine

## 2013-02-11 ENCOUNTER — Other Ambulatory Visit: Payer: Self-pay

## 2013-02-11 DIAGNOSIS — Z1231 Encounter for screening mammogram for malignant neoplasm of breast: Secondary | ICD-10-CM

## 2013-02-16 ENCOUNTER — Ambulatory Visit: Payer: BC Managed Care – PPO | Admitting: Internal Medicine

## 2013-02-25 ENCOUNTER — Encounter: Payer: Self-pay | Admitting: Internal Medicine

## 2013-02-25 ENCOUNTER — Ambulatory Visit (INDEPENDENT_AMBULATORY_CARE_PROVIDER_SITE_OTHER)
Admission: RE | Admit: 2013-02-25 | Discharge: 2013-02-25 | Disposition: A | Discharge status: Home / Self Care | Payer: BC Managed Care – PPO | Source: Ambulatory Visit | Attending: Internal Medicine | Admitting: Internal Medicine

## 2013-02-25 ENCOUNTER — Ambulatory Visit (INDEPENDENT_AMBULATORY_CARE_PROVIDER_SITE_OTHER): Payer: BC Managed Care – PPO | Admitting: Internal Medicine

## 2013-02-25 VITALS — BP 162/92 | HR 88 | Temp 98.0°F | Ht 63.0 in | Wt 170.5 lb

## 2013-02-25 DIAGNOSIS — J45901 Unspecified asthma with (acute) exacerbation: Secondary | ICD-10-CM

## 2013-02-25 DIAGNOSIS — J4541 Moderate persistent asthma with (acute) exacerbation: Secondary | ICD-10-CM

## 2013-02-25 DIAGNOSIS — I10 Essential (primary) hypertension: Secondary | ICD-10-CM

## 2013-02-25 DIAGNOSIS — E119 Type 2 diabetes mellitus without complications: Secondary | ICD-10-CM

## 2013-02-25 DIAGNOSIS — Z0000 Encounter for general adult medical examination without abnormal findings: Secondary | ICD-10-CM

## 2013-02-25 DIAGNOSIS — IMO0001 Reserved for inherently not codable concepts without codable children: Secondary | ICD-10-CM

## 2013-02-25 MED ORDER — BECLOMETHASONE DIPROPIONATE 80 MCG/ACT IN AERS: 1.0000 | INHALATION_SPRAY | Freq: Two times a day (BID) | RESPIRATORY_TRACT | Status: DC

## 2013-02-25 MED ORDER — ALBUTEROL SULFATE HFA 108 (90 BASE) MCG/ACT IN AERS: 2.0000 | INHALATION_SPRAY | Freq: Four times a day (QID) | RESPIRATORY_TRACT | Status: DC | PRN

## 2013-02-25 MED ORDER — METHYLPREDNISOLONE ACETATE 80 MG/ML IJ SUSP
80.0000 mg | Freq: Once | INTRAMUSCULAR | Status: AC
  Administered 2013-02-25: 80 mg via INTRAMUSCULAR

## 2013-02-25 MED ORDER — PREDNISONE 10 MG PO TABS: ORAL_TABLET | ORAL | Status: DC

## 2013-02-25 NOTE — Assessment & Plan Note (Signed)
Mild, for depomedrol IM, predpack asd, refill inhalers and encouraged compliance

## 2013-02-25 NOTE — Patient Instructions (Addendum)
You had the steroid shot today Please take all new medication as prescribed - the prednisone, and cough medicine if needed Please continue all other medications as before, and refills have been done for the inhalers  Please remember to sign up for My Chart if you have not done so, as this will be important to you in the future with finding out test results, communicating by private email, and scheduling acute appointments online when needed.  Please return in 6 months, or sooner if needed, with Lab testing done 3-5 days before

## 2013-02-25 NOTE — Assessment & Plan Note (Signed)
stable overall by history and exam, recent data reviewed with pt, and pt to continue medical treatment as before,  to f/u any worsening symptoms or concerns Lab Results  Component Value Date   HGBA1C 6.0 08/05/2012

## 2013-02-25 NOTE — Assessment & Plan Note (Signed)
elev today but likely reactive, stable overall by history and exam, recent data reviewed with pt, and pt to continue medical treatment as before,  to f/u any worsening symptoms or concerns BP Readings from Last 3 Encounters:  02/25/13 162/92  08/18/12 158/90  04/13/12 152/79

## 2013-02-25 NOTE — Progress Notes (Signed)
Subjective:    Patient ID: Brooke [name], female    DOB: 1950/08/28, 62 y.o.   MRN: [id number]  HPI  Here to f/u; overall doing ok,  Pt denies chest pain, increased sob or doe, wheezing, orthopnea, PND, increased LE swelling, palpitations, dizziness or syncope.  Pt denies polydipsia, polyuria, or low sugar symptoms such as weakness or confusion improved with po intake.  Pt denies new neurological symptoms such as new headache, or facial or extremity weakness or numbness.   Pt states overall good compliance with meds, has been trying to follow lower cholesterol, diabetic diet, with wt overall stable,  but little exercise however.  Also c/o 2 wks cough, prod intermittent yellowish, no blood, no fever, no sinus congestion, worsening reflux, CP, but has some ? Wheezing/sob/doe mild. More fatigued, cough keeps waking her up.  Past Medical History  Diagnosis Date  . ALOPECIA 12/30/2007  . ANEMIA-NOS 02/09/2008  . CHEST PAIN 02/28/2009  . COLD SORE 10/01/2007  . COLONIC POLYPS, HX OF 02/26/2007  . DIABETES MELLITUS, TYPE II 11/29/2008  . ECZEMA, ATOPIC DERMATITIS 09/19/2006  . FATIGUE 09/05/2009  . GERD 02/09/2008  . GOITER NOS 09/19/2006  . GOUT 08/09/2008  . HYPERLIPIDEMIA 10/01/2007  . HYPERTENSION, BENIGN SYSTEMIC 09/19/2006  . HYPERTENSION 02/26/2007  . INSOMNIA NOS 09/19/2006  . Other abnormal glucose 10/01/2007  . OTITIS EXTERNA, RIGHT 02/28/2009  . Pain in joint, lower leg 10/01/2007  . SINUSITIS- ACUTE-NOS 05/20/2009  . SKIN LESION 08/09/2008  . Unspecified asthma(493.90) 09/19/2006  . Urinary frequency 11/29/2008  . Wheezing 12/30/2009  . Impaired glucose tolerance 12/19/2010   Past Surgical History  Procedure Laterality Date  . Cholecystectomy  3.1.2004  . Abdominal hysterectomy  10/22/1999    BSO  . L ras re-stented -  03/08/2005  . L ras tx'd w/angioplasty  11/21/2003  . Left cataract surgury    . Radioactive iodine ablate  3/03    thyroid 10/07/2001  . Rastelli procedure  6/98 neg  . Split sleep  study neg for sleep apnea  11/15/2003    reports that she has never smoked. She does not have any smokeless tobacco history on file. She reports that she does not drink alcohol or use illicit drugs. family history includes Diabetes in her mother. Allergies  Allergen Reactions  . Ace Inhibitors     REACTION: Patient denies allery  . Codeine     REACTION: rash nausea  . Hydrocodone     Dizzy and nausea  . Tramadol     n/v   Current Outpatient Prescriptions on File Prior to Visit  Medication Sig Dispense Refill  . albuterol (PROVENTIL,VENTOLIN) 90 MCG/ACT inhaler Inhale 2 puffs into the lungs every 6 (six) hours as needed.  17 g  5  . ALPRAZolam (XANAX) 0.25 MG tablet Take 0.25 mg by mouth 2 (two) times daily as needed.        . beclomethasone (QVAR) 80 MCG/ACT inhaler Inhale 1 puff into the lungs 2 (two) times daily.  1 Inhaler  11  . colchicine 0.6 MG tablet Take 1 tablet (0.6 mg total) by mouth daily.  90 tablet  3  . diclofenac sodium (VOLTAREN) 1 % GEL Apply three times to four times per day       . ibuprofen (ADVIL,MOTRIN) 400 MG tablet TAKE 1 TABLET BY MOUTH EVERY 6 HOURS AS NEEDED FOR PAIN  120 tablet  2  . ibuprofen (ADVIL,MOTRIN) 800 MG tablet Take 1 tablet (800 mg total) by mouth every  8 (eight) hours as needed for pain.  21 tablet  0  . metFORMIN (GLUCOPHAGE) 500 MG tablet TAKE 1 TABLET BY MOUTH EVERY DAY  90 tablet  3  . montelukast (SINGULAIR) 10 MG tablet Take 10 mg by mouth daily.        Marland Kitchen omeprazole (PRILOSEC) 20 MG capsule Take 1 capsule (20 mg total) by mouth 2 (two) times daily.  180 capsule  3  . potassium chloride (KLOR-CON 10) 10 MEQ tablet Take 1 tablet (10 mEq total) by mouth 2 (two) times daily.  90 tablet  3  . simvastatin (ZOCOR) 40 MG tablet Take 1 tablet (40 mg total) by mouth daily.  90 tablet  3  . TRIBENZOR 40-10-25 MG TABS TAKE 1 TABLET BY MOUTH EVERY DAY  90 tablet  1  . zolpidem (AMBIEN) 10 MG tablet Take 1 tablet (10 mg total) by mouth at bedtime as  needed.  30 tablet  5   No current facility-administered medications on file prior to visit.   Review of Systems  Constitutional: Negative for unexpected weight change, or unusual diaphoresis  HENT: Negative for tinnitus.   Eyes: Negative for photophobia and visual disturbance.  Respiratory: Negative for choking and stridor.   Gastrointestinal: Negative for vomiting and blood in stool.  Genitourinary: Negative for hematuria and decreased urine volume.  Musculoskeletal: Negative for acute joint swelling Skin: Negative for color change and wound.  Neurological: Negative for tremors and numbness other than noted  Psychiatric/Behavioral: Negative for decreased concentration or  hyperactivity.       Objective:   Physical Exam BP 162/92  Pulse 88  Temp(Src) 98 F (36.7 C) (Oral)  Ht 5\' 3"  (1.6 m)  Wt 170 lb 8 oz (77.338 kg)  BMI 30.21 kg/m2  SpO2 99% VS noted, not ill but coughing Constitutional: Pt appears well-developed and well-nourished.  HENT: Head: NCAT.  Right Ear: External ear normal.  Left Ear: External ear normal.  Eyes: Conjunctivae and EOM are normal. Pupils are equal, round, and reactive to light.  Neck: Normal range of motion. Neck supple.  Cardiovascular: Normal rate and regular rhythm.   Pulmonary/Chest: Effort normal and breath sounds decreased with bilat few whezes.  Abd:  Soft, NT, non-distended, + BS Neurological: Pt is alert. Not confused  Skin: Skin is warm. No erythema.  Psychiatric: Pt behavior is normal. Thought content normal.      Assessment & Plan:

## 2013-02-26 ENCOUNTER — Ambulatory Visit
Admission: RE | Admit: 2013-02-26 | Discharge: 2013-02-26 | Disposition: A | Discharge status: Home / Self Care | Payer: BC Managed Care – PPO | Source: Ambulatory Visit

## 2013-02-26 DIAGNOSIS — Z1231 Encounter for screening mammogram for malignant neoplasm of breast: Secondary | ICD-10-CM

## 2013-02-26 LAB — HM MAMMOGRAPHY: HM MAMMO: NEGATIVE

## 2013-04-02 ENCOUNTER — Telehealth: Payer: Self-pay | Admitting: Internal Medicine

## 2013-04-02 ENCOUNTER — Ambulatory Visit (INDEPENDENT_AMBULATORY_CARE_PROVIDER_SITE_OTHER): Payer: BC Managed Care – PPO

## 2013-04-02 DIAGNOSIS — Z23 Encounter for immunization: Secondary | ICD-10-CM

## 2013-04-02 MED ORDER — BECLOMETHASONE DIPROPIONATE 80 MCG/ACT IN AERS: 1.0000 | INHALATION_SPRAY | Freq: Two times a day (BID) | RESPIRATORY_TRACT | Status: DC

## 2013-04-02 MED ORDER — ALBUTEROL SULFATE HFA 108 (90 BASE) MCG/ACT IN AERS: 2.0000 | INHALATION_SPRAY | Freq: Four times a day (QID) | RESPIRATORY_TRACT | Status: DC | PRN

## 2013-04-02 NOTE — Telephone Encounter (Signed)
Called the patient left detailed message prescriptions requested have been sent to local pharmacy

## 2013-04-02 NOTE — Telephone Encounter (Signed)
Patient states she was prescribed albuterol and QVar for cough 02/25/13.  Rx was sent to [facility] but she states she has never received the medication from them.  States still has cough, but states is unchanged from when seen.  Declines triage.  Would like Rx called in to [facility] [place] .[phone].  Rx called per Epic for albuterol inhaler 2 puffs q 6 h prn, RF x 11, and QVar 1 puff BID RF x 11 to [facility] as above.  krs/can

## 2013-04-02 NOTE — Addendum Note (Signed)
Addended by: Scharlene Gloss B on: 04/02/2013 03:59 PM   Modules accepted: Orders

## 2013-04-02 NOTE — Telephone Encounter (Signed)
Ok with me 

## 2013-04-07 ENCOUNTER — Telehealth: Payer: Self-pay

## 2013-04-07 MED ORDER — BUDESONIDE 180 MCG/ACT IN AEPB: 2.0000 | INHALATION_SPRAY | Freq: Two times a day (BID) | RESPIRATORY_TRACT | Status: DC

## 2013-04-07 NOTE — Telephone Encounter (Signed)
Ok to try pulmicort to see if less expensive

## 2013-04-07 NOTE — Telephone Encounter (Signed)
Qvar is too expensive please office alternative.

## 2013-04-14 ENCOUNTER — Other Ambulatory Visit: Payer: Self-pay | Admitting: Internal Medicine

## 2013-04-28 ENCOUNTER — Telehealth: Payer: Self-pay

## 2013-04-28 MED ORDER — IBUPROFEN 400 MG PO TABS: 400.0000 mg | ORAL_TABLET | Freq: Four times a day (QID) | ORAL | Status: DC | PRN

## 2013-04-28 NOTE — Telephone Encounter (Signed)
refill 

## 2013-06-10 ENCOUNTER — Other Ambulatory Visit: Payer: Self-pay | Admitting: Internal Medicine

## 2013-07-23 DIAGNOSIS — Z87898 Personal history of other specified conditions: Secondary | ICD-10-CM

## 2013-07-23 HISTORY — DX: Personal history of other specified conditions: Z87.898

## 2013-08-28 ENCOUNTER — Ambulatory Visit (INDEPENDENT_AMBULATORY_CARE_PROVIDER_SITE_OTHER): Payer: BC Managed Care – PPO

## 2013-08-28 ENCOUNTER — Ambulatory Visit (INDEPENDENT_AMBULATORY_CARE_PROVIDER_SITE_OTHER)
Admission: RE | Admit: 2013-08-28 | Discharge: 2013-08-28 | Disposition: A | Discharge status: Home / Self Care | Payer: BC Managed Care – PPO | Source: Ambulatory Visit | Attending: Internal Medicine | Admitting: Internal Medicine

## 2013-08-28 ENCOUNTER — Ambulatory Visit (INDEPENDENT_AMBULATORY_CARE_PROVIDER_SITE_OTHER): Payer: BC Managed Care – PPO | Admitting: Internal Medicine

## 2013-08-28 ENCOUNTER — Encounter: Payer: Self-pay | Admitting: Internal Medicine

## 2013-08-28 VITALS — BP 172/90 | HR 109 | Temp 98.0°F | Ht 63.0 in | Wt 170.5 lb

## 2013-08-28 DIAGNOSIS — E119 Type 2 diabetes mellitus without complications: Secondary | ICD-10-CM

## 2013-08-28 DIAGNOSIS — R05 Cough: Secondary | ICD-10-CM

## 2013-08-28 DIAGNOSIS — E785 Hyperlipidemia, unspecified: Secondary | ICD-10-CM

## 2013-08-28 DIAGNOSIS — I10 Essential (primary) hypertension: Secondary | ICD-10-CM

## 2013-08-28 DIAGNOSIS — Z0000 Encounter for general adult medical examination without abnormal findings: Secondary | ICD-10-CM

## 2013-08-28 DIAGNOSIS — R059 Cough, unspecified: Secondary | ICD-10-CM

## 2013-08-28 LAB — CBC WITH DIFFERENTIAL/PLATELET
BASOS ABS: 0 10*3/uL (ref 0.0–0.1)
Basophils Relative: 0.4 % (ref 0.0–3.0)
Eosinophils Absolute: 0 10*3/uL (ref 0.0–0.7)
Eosinophils Relative: 0.7 % (ref 0.0–5.0)
HCT: 39.3 % (ref 36.0–46.0)
Hemoglobin: 12.6 g/dL (ref 12.0–15.0)
LYMPHS PCT: 32 % (ref 12.0–46.0)
Lymphs Abs: 2.1 10*3/uL (ref 0.7–4.0)
MCHC: 32 g/dL (ref 30.0–36.0)
MCV: 84.3 fl (ref 78.0–100.0)
MONOS PCT: 10.1 % (ref 3.0–12.0)
Monocytes Absolute: 0.7 10*3/uL (ref 0.1–1.0)
NEUTROS PCT: 56.8 % (ref 43.0–77.0)
Neutro Abs: 3.7 10*3/uL (ref 1.4–7.7)
PLATELETS: 213 10*3/uL (ref 150.0–400.0)
RBC: 4.66 Mil/uL (ref 3.87–5.11)
RDW: 13.8 % (ref 11.5–14.6)
WBC: 6.5 10*3/uL (ref 4.5–10.5)

## 2013-08-28 LAB — URINALYSIS, ROUTINE W REFLEX MICROSCOPIC
Bilirubin Urine: NEGATIVE
Ketones, ur: NEGATIVE
Nitrite: NEGATIVE
Specific Gravity, Urine: 1.02
Total Protein, Urine: NEGATIVE
Urine Glucose: NEGATIVE
Urobilinogen, UA: 0.2
pH: 6 (ref 5.0–8.0)

## 2013-08-28 LAB — TSH: TSH: 1.22 u[IU]/mL (ref 0.35–5.50)

## 2013-08-28 LAB — HEMOGLOBIN A1C: Hgb A1c MFr Bld: 5.9 % (ref 4.6–6.5)

## 2013-08-28 MED ORDER — AZITHROMYCIN 250 MG PO TABS: ORAL_TABLET | ORAL | Status: DC

## 2013-08-28 MED ORDER — HYDROCODONE-HOMATROPINE 5-1.5 MG/5ML PO SYRP: 5.0000 mL | ORAL_SOLUTION | Freq: Four times a day (QID) | ORAL | Status: DC | PRN

## 2013-08-28 NOTE — Assessment & Plan Note (Signed)

## 2013-08-28 NOTE — Patient Instructions (Signed)
Please take all new medication as prescribed - the antibiotic, cough medicine Please continue all other medications as before, and refills have been done if requested. Please have the pharmacy call with any other refills you may need. Please continue your efforts at being more active, low cholesterol diet, and weight control. You are otherwise up to date with prevention measures today. Please keep your appointments with your specialists as you may have planned  Please go to the XRAY Department in the Basement (go straight as you get off the elevator) for the x-ray testing  Please go to the LAB in the Basement (turn left off the elevator) for the tests to be done today You will be contacted by phone if any changes need to be made immediately.  Otherwise, you will receive a letter about your results with an explanation, but please check with MyChart first.  Please remember to sign up for MyChart if you have not done so, as this will be important to you in the future with finding out test results, communicating by private email, and scheduling acute appointments online when needed.  Please return in 6 months, or sooner if needed, with Lab testing done 3-5 days before

## 2013-08-28 NOTE — Assessment & Plan Note (Signed)
Cant r/o pna - for zpack, cough med, and cxr - r/o pna

## 2013-08-28 NOTE — Assessment & Plan Note (Signed)
stable overall by history and exam, recent data reviewed with pt, and pt to continue medical treatment as before,  to f/u any worsening symptoms or concerns Lab Results  Component Value Date   LDLCALC 87 08/05/2012

## 2013-08-28 NOTE — Progress Notes (Signed)
Subjective:    Patient ID: Brooke [name], female    DOB: 12/22/1950, 63 y.o.   MRN: [id number]  HPI  Here for wellness and f/u;  Overall doing ok;  Pt denies CP, worsening SOB, DOE, wheezing, orthopnea, PND, worsening LE edema, palpitations, dizziness or syncope.  Pt denies neurological change such as new headache, facial or extremity weakness.  Pt denies polydipsia, polyuria, or low sugar symptoms. Pt states overall good compliance with treatment and medications, good tolerability, and has been trying to follow lower cholesterol diet.  Pt denies worsening depressive symptoms, suicidal ideation or panic. No fever, night sweats, wt loss, loss of appetite, or other constitutional symptoms.  Pt states good ability with ADL's, has low fall risk, home safety reviewed and adequate, no other significant changes in hearing or vision, and only occasionally active with exercise.Also  Here with 2-3 days acute onset fever, facial pain, pressure, headache, general weakness and malaise, and greenish d/c, with mild ST and prod cough, hard to sleep, gags and occas vomit. 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  BP at work often elvated as well, had run out of tribenzor.   Past Medical History  Diagnosis Date  . ALOPECIA 12/30/2007  . ANEMIA-NOS 02/09/2008  . CHEST PAIN 02/28/2009  . COLD SORE 10/01/2007  . COLONIC POLYPS, HX OF 02/26/2007  . DIABETES MELLITUS, TYPE II 11/29/2008  . ECZEMA, ATOPIC DERMATITIS 09/19/2006  . FATIGUE 09/05/2009  . GERD 02/09/2008  . GOITER NOS 09/19/2006  . GOUT 08/09/2008  . HYPERLIPIDEMIA 10/01/2007  . HYPERTENSION, BENIGN SYSTEMIC 09/19/2006  . HYPERTENSION 02/26/2007  . INSOMNIA NOS 09/19/2006  . Other abnormal glucose 10/01/2007  . OTITIS EXTERNA, RIGHT 02/28/2009  . Pain in joint, lower leg 10/01/2007  . SINUSITIS- ACUTE-NOS 05/20/2009  . SKIN LESION 08/09/2008  . Unspecified asthma(493.90) 09/19/2006  . Urinary frequency 11/29/2008  . Wheezing 12/30/2009  . Impaired  glucose tolerance 12/19/2010   Past Surgical History  Procedure Laterality Date  . Cholecystectomy  3.1.2004  . Abdominal hysterectomy  10/22/1999    BSO  . L ras re-stented -  03/08/2005  . L ras tx'd w/angioplasty  11/21/2003  . Left cataract surgury    . Radioactive iodine ablate  3/03    thyroid 10/07/2001  . Rastelli procedure  6/98 neg  . Split sleep study neg for sleep apnea  11/15/2003    reports that she has never smoked. She does not have any smokeless tobacco history on file. She reports that she does not drink alcohol or use illicit drugs. family history includes Diabetes in her mother. Allergies  Allergen Reactions  . Ace Inhibitors     REACTION: Patient denies allery  . Codeine     REACTION: rash nausea  . Hydrocodone     Dizzy and nausea  . Tramadol     n/v   Current Outpatient Prescriptions on File Prior to Visit  Medication Sig Dispense Refill  . albuterol (PROVENTIL HFA;VENTOLIN HFA) 108 (90 BASE) MCG/ACT inhaler Inhale 2 puffs into the lungs every 6 (six) hours as needed for wheezing.  1 Inhaler  11  . ALPRAZolam (XANAX) 0.25 MG tablet Take 0.25 mg by mouth 2 (two) times daily as needed.        . budesonide (PULMICORT FLEXHALER) 180 MCG/ACT inhaler Inhale 2 puffs into the lungs 2 (two) times daily.  1 Inhaler  11  . colchicine 0.6 MG tablet Take 1 tablet (0.6 mg total) by  mouth daily.  90 tablet  3  . diclofenac sodium (VOLTAREN) 1 % GEL Apply three times to four times per day       . ibuprofen (ADVIL,MOTRIN) 400 MG tablet Take 1 tablet (400 mg total) by mouth every 6 (six) hours as needed for pain.  120 tablet  3  . ibuprofen (ADVIL,MOTRIN) 800 MG tablet Take 1 tablet (800 mg total) by mouth every 8 (eight) hours as needed for pain.  21 tablet  0  . metFORMIN (GLUCOPHAGE) 500 MG tablet TAKE 1 TABLET BY MOUTH EVERY DAY  90 tablet  3  . montelukast (SINGULAIR) 10 MG tablet Take 10 mg by mouth daily.        Marland Kitchen omeprazole (PRILOSEC) 20 MG capsule Take 1 capsule (20 mg  total) by mouth 2 (two) times daily.  180 capsule  3  . potassium chloride (KLOR-CON 10) 10 MEQ tablet Take 1 tablet (10 mEq total) by mouth 2 (two) times daily.  90 tablet  3  . simvastatin (ZOCOR) 40 MG tablet Take 1 tablet (40 mg total) by mouth daily.  90 tablet  3  . TRIBENZOR 40-10-25 MG TABS TAKE 1 TABLET BY MOUTH EVERY DAY  90 tablet  1  . TRIBENZOR 40-10-25 MG TABS TAKE 1 TABLET BY MOUTH EVERY DAY  90 tablet  2  . zolpidem (AMBIEN) 10 MG tablet Take 1 tablet (10 mg total) by mouth at bedtime as needed.  30 tablet  5   No current facility-administered medications on file prior to visit.    Review of Systems Constitutional: Negative for diaphoresis, activity change, appetite change or unexpected weight change.  HENT: Negative for hearing loss, ear pain, facial swelling, mouth sores and neck stiffness.   Eyes: Negative for pain, redness and visual disturbance.  Respiratory: Negative for shortness of breath and wheezing.   Cardiovascular: Negative for chest pain and palpitations.  Gastrointestinal: Negative for diarrhea, blood in stool, abdominal distention or other pain Genitourinary: Negative for hematuria, flank pain or change in urine volume.  Musculoskeletal: Negative for myalgias and joint swelling.  Skin: Negative for color change and wound.  Neurological: Negative for syncope and numbness. other than noted Hematological: Negative for adenopathy.  Psychiatric/Behavioral: Negative for hallucinations, self-injury, decreased concentration and agitation.      Objective:   Physical Exam BP 172/90  Pulse 109  Temp(Src) 98 F (36.7 C) (Oral)  Ht 5\' 3"  (1.6 m)  Wt 170 lb 8 oz (77.338 kg)  BMI 30.21 kg/m2  SpO2 95% VS noted, mild ill Constitutional: Pt is oriented to person, place, and time. Appears well-developed and well-nourished.  Head: Normocephalic and atraumatic.  Right Ear: External ear normal.  Left Ear: External ear normal.  Nose: Nose normal.  Mouth/Throat:  Oropharynx is clear and moist.  Eyes: Conjunctivae and EOM are normal. Pupils are equal, round, and reactive to light.  Neck: Normal range of motion. Neck supple. No JVD present. No tracheal deviation present.  Cardiovascular: Normal rate, regular rhythm, normal heart sounds and intact distal pulses.   Pulmonary/Chest: Effort normal and breath sounds decreased bilat with few rhonchi.  Abdominal: Soft. Bowel sounds are normal. There is no tenderness. No HSM  Musculoskeletal: Normal range of motion. Exhibits no edema.  Lymphadenopathy:  Has no cervical adenopathy.  Neurological: Pt is alert and oriented to person, place, and time. Pt has normal reflexes. No cranial nerve deficit.  Skin: Skin is warm and dry. No rash noted.  Psychiatric:  Has  normal  mood and affect. Behavior is normal.      Assessment & Plan:

## 2013-08-28 NOTE — Assessment & Plan Note (Signed)
incresased cbg's recnetly o/w stable overall by history and exam, recent data reviewed with pt, and pt to continue medical treatment as before for now,  to f/u any worsening symptoms or concerns, and check f/u labs today Lab Results  Component Value Date   HGBA1C 6.0 08/05/2012  \

## 2013-08-28 NOTE — Progress Notes (Signed)
Pre-visit discussion using our clinic review tool. No additional management support is needed unless otherwise documented below in the visit note.  

## 2013-08-31 ENCOUNTER — Telehealth: Payer: Self-pay | Admitting: *Deleted

## 2013-08-31 LAB — BASIC METABOLIC PANEL
BUN: 11 mg/dL (ref 6–23)
CO2: 29 mEq/L (ref 19–32)
CREATININE: 1 mg/dL (ref 0.4–1.2)
Calcium: 9.6 mg/dL (ref 8.4–10.5)
Chloride: 103 mEq/L (ref 96–112)
GFR: 74.92 mL/min (ref >60.00)
GLUCOSE: 79 mg/dL (ref 70–99)
Potassium: 3.5 mEq/L (ref 3.5–5.1)
Sodium: 140 mEq/L (ref 135–145)

## 2013-08-31 LAB — LIPID PANEL
Cholesterol: 149 mg/dL (ref 0–200)
HDL: 55.1 mg/dL (ref >39.00)
LDL Cholesterol: 77 mg/dL (ref 0–99)
TRIGLYCERIDES: 83 mg/dL (ref 0.0–149.0)
Total CHOL/HDL Ratio: 3
VLDL: 16.6 mg/dL (ref 0.0–40.0)

## 2013-08-31 LAB — HEPATIC FUNCTION PANEL
ALBUMIN: 4.2 g/dL (ref 3.5–5.2)
ALT: 10 U/L (ref 0–35)
AST: 19 U/L (ref 0–37)
Alkaline Phosphatase: 73 U/L (ref 39–117)
BILIRUBIN TOTAL: 0.4 mg/dL (ref 0.3–1.2)
Bilirubin, Direct: 0 mg/dL (ref 0.0–0.3)
Total Protein: 7.7 g/dL (ref 6.0–8.3)

## 2013-08-31 LAB — MICROALBUMIN / CREATININE URINE RATIO
Creatinine,U: 114.6 mg/dL
Microalb Creat Ratio: 0.5 mg/g (ref 0.0–30.0)
Microalb, Ur: 0.6 mg/dL (ref 0.0–1.9)

## 2013-08-31 NOTE — Telephone Encounter (Signed)
Patient phoned and stated that Saturday she had facial swelling and optic pain, reacting from cough syrup prescribed.  States she stopped taking medication & is requesting alternate prescription.  Please advise.   CB# [phone]

## 2013-09-01 ENCOUNTER — Encounter: Payer: Self-pay | Admitting: Internal Medicine

## 2013-09-01 NOTE — Telephone Encounter (Signed)
As this could be an allergic reaction pt should take benadryl 50 mg otc prn, ok for delsym otc

## 2013-09-02 NOTE — Telephone Encounter (Signed)
Phoned patient and relayed MD's recommendations.

## 2013-09-13 ENCOUNTER — Other Ambulatory Visit: Payer: Self-pay | Admitting: Internal Medicine

## 2013-09-15 ENCOUNTER — Telehealth: Payer: Self-pay | Admitting: *Deleted

## 2013-09-15 NOTE — Telephone Encounter (Signed)
Pt phoned requesting gout med refilled.  Per [facility] review, pharmacy confirmed receipt yesterday at 1521.

## 2013-09-18 ENCOUNTER — Ambulatory Visit: Payer: Self-pay

## 2013-09-18 ENCOUNTER — Other Ambulatory Visit: Payer: Self-pay | Admitting: Occupational Medicine

## 2013-09-18 DIAGNOSIS — R52 Pain, unspecified: Secondary | ICD-10-CM

## 2013-10-28 ENCOUNTER — Telehealth: Payer: Self-pay

## 2013-10-28 MED ORDER — BENZONATATE 100 MG PO CAPS: ORAL_CAPSULE | ORAL | Status: DC

## 2013-10-28 NOTE — Telephone Encounter (Signed)
The patient called and is hoping to get something called in for a cough.  She was offered and ov, but is hoping something can be called in to help with her cough symptoms without and ov.   Thanks!

## 2013-10-28 NOTE — Telephone Encounter (Signed)
Done per emr - tessalon perle

## 2013-10-29 NOTE — Telephone Encounter (Signed)
Patient informed. 

## 2013-11-06 ENCOUNTER — Ambulatory Visit (INDEPENDENT_AMBULATORY_CARE_PROVIDER_SITE_OTHER)
Admission: RE | Admit: 2013-11-06 | Discharge: 2013-11-06 | Disposition: A | Discharge status: Home / Self Care | Payer: BC Managed Care – PPO | Source: Ambulatory Visit | Attending: Internal Medicine | Admitting: Internal Medicine

## 2013-11-06 ENCOUNTER — Other Ambulatory Visit (INDEPENDENT_AMBULATORY_CARE_PROVIDER_SITE_OTHER): Payer: BC Managed Care – PPO

## 2013-11-06 ENCOUNTER — Encounter: Payer: Self-pay | Admitting: Internal Medicine

## 2013-11-06 ENCOUNTER — Ambulatory Visit (INDEPENDENT_AMBULATORY_CARE_PROVIDER_SITE_OTHER): Payer: BC Managed Care – PPO | Admitting: Internal Medicine

## 2013-11-06 VITALS — BP 170/90 | HR 104 | Wt 170.4 lb

## 2013-11-06 DIAGNOSIS — R079 Chest pain, unspecified: Secondary | ICD-10-CM

## 2013-11-06 DIAGNOSIS — R51 Headache: Secondary | ICD-10-CM

## 2013-11-06 DIAGNOSIS — R55 Syncope and collapse: Secondary | ICD-10-CM

## 2013-11-06 DIAGNOSIS — R112 Nausea with vomiting, unspecified: Secondary | ICD-10-CM | POA: Insufficient documentation

## 2013-11-06 DIAGNOSIS — R519 Headache, unspecified: Secondary | ICD-10-CM | POA: Insufficient documentation

## 2013-11-06 LAB — CBC WITH DIFFERENTIAL/PLATELET
BASOS PCT: 0.5 % (ref 0.0–3.0)
Basophils Absolute: 0 10*3/uL (ref 0.0–0.1)
Eosinophils Absolute: 0.1 10*3/uL (ref 0.0–0.7)
Eosinophils Relative: 0.7 % (ref 0.0–5.0)
HCT: 37.5 % (ref 36.0–46.0)
Hemoglobin: 12.5 g/dL (ref 12.0–15.0)
LYMPHS PCT: 30.6 % (ref 12.0–46.0)
Lymphs Abs: 2.3 10*3/uL (ref 0.7–4.0)
MCHC: 33.4 g/dL (ref 30.0–36.0)
MCV: 81.9 fl (ref 78.0–100.0)
Monocytes Absolute: 1 10*3/uL (ref 0.1–1.0)
Monocytes Relative: 12.5 % — ABNORMAL HIGH (ref 3.0–12.0)
NEUTROS PCT: 55.7 % (ref 43.0–77.0)
Neutro Abs: 4.2 10*3/uL (ref 1.4–7.7)
Platelets: 213 10*3/uL (ref 150.0–400.0)
RBC: 4.58 Mil/uL (ref 3.87–5.11)
RDW: 14 % (ref 11.5–14.6)
WBC: 7.6 10*3/uL (ref 4.5–10.5)

## 2013-11-06 LAB — HEPATIC FUNCTION PANEL
ALK PHOS: 73 U/L (ref 39–117)
ALT: 11 U/L (ref 0–35)
AST: 20 U/L (ref 0–37)
Albumin: 3.9 g/dL (ref 3.5–5.2)
Bilirubin, Direct: 0 mg/dL (ref 0.0–0.3)
Total Bilirubin: 0.5 mg/dL (ref 0.3–1.2)
Total Protein: 7.2 g/dL (ref 6.0–8.3)

## 2013-11-06 LAB — LIPID PANEL
Cholesterol: 130 mg/dL (ref 0–200)
HDL: 43.8 mg/dL (ref >39.00)
LDL Cholesterol: 74 mg/dL (ref 0–99)
Total CHOL/HDL Ratio: 3
Triglycerides: 60 mg/dL (ref 0.0–149.0)
VLDL: 12 mg/dL (ref 0.0–40.0)

## 2013-11-06 LAB — TSH: TSH: 1.41 u[IU]/mL (ref 0.35–5.50)

## 2013-11-06 LAB — URINALYSIS, ROUTINE W REFLEX MICROSCOPIC
Bilirubin Urine: NEGATIVE
Ketones, ur: NEGATIVE
Nitrite: NEGATIVE
TOTAL PROTEIN, URINE-UPE24: NEGATIVE
URINE GLUCOSE: NEGATIVE
Urobilinogen, UA: 0.2 (ref 0.0–1.0)
pH: 6 (ref 5.0–8.0)

## 2013-11-06 LAB — BASIC METABOLIC PANEL
BUN: 21 mg/dL (ref 6–23)
CHLORIDE: 103 meq/L (ref 96–112)
CO2: 27 meq/L (ref 19–32)
Calcium: 9.4 mg/dL (ref 8.4–10.5)
Creatinine, Ser: 0.9 mg/dL (ref 0.4–1.2)
GFR: 79.59 mL/min (ref >60.00)
GLUCOSE: 102 mg/dL — AB (ref 70–99)
POTASSIUM: 3 meq/L — AB (ref 3.5–5.1)
SODIUM: 139 meq/L (ref 135–145)

## 2013-11-06 NOTE — Assessment & Plan Note (Signed)
Also unclear etiology, exam benign, for head MRI,  to f/u any worsening symptoms or concerns

## 2013-11-06 NOTE — Assessment & Plan Note (Signed)
Also unclear etiology, for labs

## 2013-11-06 NOTE — Progress Notes (Signed)
Pre visit review using our clinic review tool, if applicable. No additional management support is needed unless otherwise documented below in the visit note. 

## 2013-11-06 NOTE — Assessment & Plan Note (Addendum)
Unclear etiology, ECG reviewed as per emr, for cxr, labs, echo, carotids, MRI, card referral  Note:  Total time for pt hx, exam, review of record with pt in the room, determination of diagnoses and plan for further eval and tx is > 40 min, with over 50% spent in coordination and counseling of patient

## 2013-11-06 NOTE — Assessment & Plan Note (Addendum)
ECG reviewed as per emr, atypical, for stress test given risk factors

## 2013-11-06 NOTE — Progress Notes (Signed)
Subjective:    Patient ID: Brooke [name], female    DOB: 1951/05/02, 63 y.o.   MRN: [id number]  HPI  Here to f/u, states BP < 140/90 at home. 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 Also c/o recurring HA, today as well, with sweats at nigth only at top of her head.  Also with recurring mid ant CP with radiation to right arm, not assoc with n/v, palp, diaphoresis, dizziness, sob. Also with several episode n/v in the past wk, no fever, blood, or diarrhea, now improved Past Medical History  Diagnosis Date  . ALOPECIA 12/30/2007  . ANEMIA-NOS 02/09/2008  . CHEST PAIN 02/28/2009  . COLD SORE 10/01/2007  . COLONIC POLYPS, HX OF 02/26/2007  . DIABETES MELLITUS, TYPE II 11/29/2008  . ECZEMA, ATOPIC DERMATITIS 09/19/2006  . FATIGUE 09/05/2009  . GERD 02/09/2008  . GOITER NOS 09/19/2006  . GOUT 08/09/2008  . HYPERLIPIDEMIA 10/01/2007  . HYPERTENSION, BENIGN SYSTEMIC 09/19/2006  . HYPERTENSION 02/26/2007  . INSOMNIA NOS 09/19/2006  . Other abnormal glucose 10/01/2007  . OTITIS EXTERNA, RIGHT 02/28/2009  . Pain in joint, lower leg 10/01/2007  . SINUSITIS- ACUTE-NOS 05/20/2009  . SKIN LESION 08/09/2008  . Unspecified asthma(493.90) 09/19/2006  . Urinary frequency 11/29/2008  . Wheezing 12/30/2009  . Impaired glucose tolerance 12/19/2010   Past Surgical History  Procedure Laterality Date  . Cholecystectomy  3.1.2004  . Abdominal hysterectomy  10/22/1999    BSO  . L ras re-stented -  03/08/2005  . L ras tx'd w/angioplasty  11/21/2003  . Left cataract surgury    . Radioactive iodine ablate  3/03    thyroid 10/07/2001  . Rastelli procedure  6/98 neg  . Split sleep study neg for sleep apnea  11/15/2003    reports that she has never smoked. She does not have any smokeless tobacco  history on file. She reports that she does not drink alcohol or use illicit drugs. family history includes Diabetes in her mother. Allergies  Allergen Reactions  . Ace Inhibitors     REACTION: Patient denies allery  . Codeine     REACTION: rash nausea  . Hydrocodone     Dizzy and nausea  . Tramadol     n/v   Current Outpatient Prescriptions on File Prior to Visit  Medication Sig Dispense Refill  . albuterol (PROVENTIL HFA;VENTOLIN HFA) 108 (90 BASE) MCG/ACT inhaler Inhale 2 puffs into the lungs every 6 (six) hours as needed for wheezing.  1 Inhaler  11  . ALPRAZolam (XANAX) 0.25 MG tablet Take 0.25 mg by mouth 2 (two) times daily as needed.        . budesonide (PULMICORT FLEXHALER) 180 MCG/ACT inhaler Inhale 2 puffs into the lungs 2 (two) times daily.  1 Inhaler  11  . COLCRYS 0.6 MG tablet TAKE 1 TABLET BY MOUTH EVERY DAY  90 tablet  3  . diclofenac sodium (VOLTAREN) 1 % GEL Apply three times to four times per day       . ibuprofen (ADVIL,MOTRIN) 400 MG tablet Take 1 tablet (400 mg total) by mouth every 6 (six) hours as needed for pain.  120 tablet  3  . ibuprofen (ADVIL,MOTRIN) 800 MG tablet Take 1 tablet (800 mg total) by mouth every  8 (eight) hours as needed for pain.  21 tablet  0  . metFORMIN (GLUCOPHAGE) 500 MG tablet TAKE 1 TABLET BY MOUTH EVERY DAY  90 tablet  3  . montelukast (SINGULAIR) 10 MG tablet Take 10 mg by mouth daily.        Marland Kitchen omeprazole (PRILOSEC) 20 MG capsule Take 1 capsule (20 mg total) by mouth 2 (two) times daily.  180 capsule  3  . potassium chloride (KLOR-CON 10) 10 MEQ tablet Take 1 tablet (10 mEq total) by mouth 2 (two) times daily.  90 tablet  3  . simvastatin (ZOCOR) 40 MG tablet Take 1 tablet (40 mg total) by mouth daily.  90 tablet  3  . TRIBENZOR 40-10-25 MG TABS TAKE 1 TABLET BY MOUTH EVERY DAY  90 tablet  2  . zolpidem (AMBIEN) 10 MG tablet Take 1 tablet (10 mg total) by mouth at bedtime as needed.  30 tablet  5   No current facility-administered  medications on file prior to visit.   Review of Systems  Constitutional: Negative for unexpected weight change, or unusual diaphoresis  HENT: Negative for tinnitus.   Eyes: Negative for photophobia and visual disturbance.  Respiratory: Negative for choking and stridor.   Gastrointestinal: Negative for vomiting and blood in stool.  Genitourinary: Negative for hematuria and decreased urine volume.  Musculoskeletal: Negative for acute joint swelling Skin: Negative for color change and wound.  Neurological: Negative for tremors and numbness other than noted  Psychiatric/Behavioral: Negative for decreased concentration or  hyperactivity.       Objective:   Physical Exam BP 170/90  Pulse 104  Wt 170 lb 6 oz (77.282 kg) VS noted,  Constitutional: Pt appears well-developed and well-nourished.  HENT: Head: NCAT.  Right Ear: External ear normal.  Left Ear: External ear normal.  Eyes: Conjunctivae and EOM are normal. Pupils are equal, round, and reactive to light.  Neck: Normal range of motion. Neck supple.  Cardiovascular: Normal rate and regular rhythm.   Pulmonary/Chest: Effort normal and breath sounds normal.  Abd:  Soft, NT, non-distended, + BS Neurological: Pt is alert. Not confused , motor, dtr intact Skin: Skin is warm. No erythema.  Psychiatric: Pt behavior is normal. Thought content normal.      Assessment & Plan:

## 2013-11-06 NOTE — Patient Instructions (Signed)
Please continue all other medications as before, and refills have been done if requested. Please have the pharmacy call with any other refills you may need.  Your EKG was OK today  Please go to the XRAY Department in the Basement (go straight as you get off the elevator) for the x-ray testing  Please go to the LAB in the Basement (turn left off the elevator) for the tests to be done today  You will be contacted by phone if any changes need to be made immediately.  Otherwise, you will receive a letter about your results with an explanation, but please check with MyChart first.  Please remember to sign up for MyChart if you have not done so, as this will be important to you in the future with finding out test results, communicating by private email, and scheduling acute appointments online when needed.  You will be contacted regarding the referral for: cardiology, echocardiogram, carotid ultrasound testing, MRI for the head, and stress testing  Please return in 1 months, or sooner if needed

## 2013-11-11 ENCOUNTER — Other Ambulatory Visit: Payer: Self-pay | Admitting: Internal Medicine

## 2013-11-11 DIAGNOSIS — R55 Syncope and collapse: Secondary | ICD-10-CM

## 2013-11-13 ENCOUNTER — Encounter (HOSPITAL_COMMUNITY): Payer: Self-pay

## 2013-11-13 ENCOUNTER — Ambulatory Visit (HOSPITAL_COMMUNITY): Payer: BC Managed Care – PPO | Attending: Cardiology | Admitting: *Deleted

## 2013-11-13 DIAGNOSIS — R55 Syncope and collapse: Secondary | ICD-10-CM | POA: Insufficient documentation

## 2013-11-13 NOTE — Progress Notes (Signed)
Carotid duplex complete 

## 2013-11-18 ENCOUNTER — Encounter: Payer: Self-pay | Admitting: Internal Medicine

## 2013-11-19 ENCOUNTER — Other Ambulatory Visit: Payer: Self-pay | Admitting: Internal Medicine

## 2013-11-19 ENCOUNTER — Other Ambulatory Visit (HOSPITAL_COMMUNITY): Payer: Self-pay | Admitting: Internal Medicine

## 2013-11-19 ENCOUNTER — Ambulatory Visit (HOSPITAL_COMMUNITY): Payer: BC Managed Care – PPO | Attending: Internal Medicine | Admitting: Radiology

## 2013-11-19 ENCOUNTER — Encounter: Payer: Self-pay | Admitting: Internal Medicine

## 2013-11-19 DIAGNOSIS — R51 Headache: Secondary | ICD-10-CM

## 2013-11-19 DIAGNOSIS — R112 Nausea with vomiting, unspecified: Secondary | ICD-10-CM

## 2013-11-19 DIAGNOSIS — R55 Syncope and collapse: Secondary | ICD-10-CM | POA: Insufficient documentation

## 2013-11-19 DIAGNOSIS — R079 Chest pain, unspecified: Secondary | ICD-10-CM

## 2013-11-19 DIAGNOSIS — R072 Precordial pain: Secondary | ICD-10-CM

## 2013-11-19 NOTE — Progress Notes (Signed)
Echocardiogram performed.  

## 2013-11-20 ENCOUNTER — Ambulatory Visit
Admission: RE | Admit: 2013-11-20 | Discharge: 2013-11-20 | Disposition: A | Discharge status: Home / Self Care | Payer: BC Managed Care – PPO | Source: Ambulatory Visit | Attending: Internal Medicine | Admitting: Internal Medicine

## 2013-11-20 ENCOUNTER — Other Ambulatory Visit: Payer: Self-pay | Admitting: Internal Medicine

## 2013-11-20 ENCOUNTER — Encounter: Payer: Self-pay | Admitting: Internal Medicine

## 2013-11-20 DIAGNOSIS — R079 Chest pain, unspecified: Secondary | ICD-10-CM

## 2013-11-20 DIAGNOSIS — R930 Abnormal findings on diagnostic imaging of skull and head, not elsewhere classified: Secondary | ICD-10-CM

## 2013-11-20 DIAGNOSIS — R55 Syncope and collapse: Secondary | ICD-10-CM

## 2013-11-20 DIAGNOSIS — R112 Nausea with vomiting, unspecified: Secondary | ICD-10-CM

## 2013-11-20 DIAGNOSIS — R51 Headache: Secondary | ICD-10-CM

## 2013-11-23 ENCOUNTER — Encounter: Payer: Self-pay | Admitting: Cardiovascular Disease

## 2013-11-24 ENCOUNTER — Encounter: Payer: Self-pay | Admitting: Cardiology

## 2013-11-24 ENCOUNTER — Ambulatory Visit (INDEPENDENT_AMBULATORY_CARE_PROVIDER_SITE_OTHER): Payer: BC Managed Care – PPO | Admitting: Cardiology

## 2013-11-24 VITALS — BP 150/92 | HR 90 | Ht 63.0 in | Wt 171.0 lb

## 2013-11-24 DIAGNOSIS — R55 Syncope and collapse: Secondary | ICD-10-CM

## 2013-11-24 DIAGNOSIS — I10 Essential (primary) hypertension: Secondary | ICD-10-CM

## 2013-11-24 DIAGNOSIS — R079 Chest pain, unspecified: Secondary | ICD-10-CM

## 2013-11-24 NOTE — Progress Notes (Signed)
[place], [address] [place], [place]  [address] Phone: [phone] Fax:  [phone]  Date:  11/24/2013   ID:  DRAKE LANDING, DOB 1950/10/27, MRN [id number]  PCP:  [name], MD  Cardiologist:  Fransico Him, MD     History of Present Illness: Brooke [name] is a 63 y.o. female with a history of HTN, dyslipdemia and DM had a syncopal episode on February 25th.  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  When she awakened she was oriented to person, place and time and her BP wsa 150/40mmHg and BS was normal.  She denies any CP, SOB or palpitations prior to the event.  She bruised her shoulder and knee.  Since then she has had intermittent headaches.  She is now referred by her PCP.  She says that she had some CP several weeks prior to the syncope that she says occurred several days several times daily.  She is very vague in her description of the pain or its occurrence.  She occasionally has dizziness but has never passed out until now.  She has chronic DOE but has asthma.  She occsaionally has some LE edema. Of note she had a recent MRI that showed an area suspicious for multiple sclerosis.  SHe has an appt with Neurology in the near future.   Wt Readings from Last 3 Encounters:  11/24/13 171 lb (77.565 kg)  11/06/13 170 lb 6 oz (77.282 kg)  08/28/13 170 lb 8 oz (77.338 kg)     Past Medical History  Diagnosis Date  . ALOPECIA 12/30/2007  . ANEMIA-NOS 02/09/2008  . CHEST PAIN 02/28/2009  . COLD SORE 10/01/2007  . COLONIC POLYPS, HX OF 02/26/2007  . DIABETES MELLITUS, TYPE II 11/29/2008  . ECZEMA, ATOPIC DERMATITIS 09/19/2006  . FATIGUE 09/05/2009  . GERD 02/09/2008  . GOITER NOS 09/19/2006  . GOUT 08/09/2008  . HYPERLIPIDEMIA 10/01/2007  . HYPERTENSION, BENIGN SYSTEMIC 09/19/2006  . HYPERTENSION 02/26/2007  . INSOMNIA NOS 09/19/2006  . Other abnormal glucose 10/01/2007  . OTITIS EXTERNA, RIGHT 02/28/2009  .  Pain in joint, lower leg 10/01/2007  . SINUSITIS- ACUTE-NOS 05/20/2009  . SKIN LESION 08/09/2008  . Unspecified asthma(493.90) 09/19/2006  . Urinary frequency 11/29/2008  . Wheezing 12/30/2009  . Impaired glucose tolerance 12/19/2010    Current Outpatient Prescriptions  Medication Sig Dispense Refill  . albuterol (PROVENTIL HFA;VENTOLIN HFA) 108 (90 BASE) MCG/ACT inhaler Inhale 2 puffs into the lungs every 6 (six) hours as needed for wheezing.  1 Inhaler  11  . ALPRAZolam (XANAX) 0.25 MG tablet Take 0.25 mg by mouth 2 (two) times daily as needed.        . benzonatate (TESSALON) 100 MG capsule       . budesonide (PULMICORT FLEXHALER) 180 MCG/ACT inhaler Inhale 2 puffs into the lungs 2 (two) times daily.  1 Inhaler  11  . COLCRYS 0.6 MG tablet TAKE 1 TABLET BY MOUTH EVERY DAY  90 tablet  3  . diclofenac sodium (VOLTAREN) 1 % GEL Apply three times to four times per day       . ibuprofen (ADVIL,MOTRIN) 400 MG tablet Take 1 tablet (400 mg total) by mouth every 6 (six) hours as needed for pain.  120 tablet  3  . ibuprofen (ADVIL,MOTRIN) 800 MG tablet Take 1 tablet (800 mg total) by mouth every 8 (eight) hours as  needed for pain.  21 tablet  0  . metFORMIN (GLUCOPHAGE) 500 MG tablet TAKE 1 TABLET BY MOUTH EVERY DAY  90 tablet  3  . montelukast (SINGULAIR) 10 MG tablet Take 10 mg by mouth daily.        Marland Kitchen omeprazole (PRILOSEC) 20 MG capsule Take 1 capsule (20 mg total) by mouth 2 (two) times daily.  180 capsule  3  . potassium chloride (KLOR-CON 10) 10 MEQ tablet Take 1 tablet (10 mEq total) by mouth 2 (two) times daily.  90 tablet  3  . simvastatin (ZOCOR) 40 MG tablet Take 1 tablet (40 mg total) by mouth daily.  90 tablet  3  . TRIBENZOR 40-10-25 MG TABS TAKE 1 TABLET BY MOUTH EVERY DAY  90 tablet  2  . zolpidem (AMBIEN) 10 MG tablet Take 1 tablet (10 mg total) by mouth at bedtime as needed.  30 tablet  5   No current facility-administered medications for this visit.    Allergies:    Allergies    Allergen Reactions  . Ace Inhibitors     REACTION: Patient denies allery  . Codeine     REACTION: rash nausea  . Hydrocodone     Dizzy and nausea  . Tramadol     n/v    Social History:  The patient  reports that she has never smoked. She does not have any smokeless tobacco history on file. She reports that she does not drink alcohol or use illicit drugs.   Family History:  The patient's family history includes Diabetes in her mother.   ROS:  Please see the history of present illness.      All other systems reviewed and negative.   PHYSICAL EXAM: VS:  BP 150/92  Pulse 90  Ht 5\' 3"  (1.6 m)  Wt 171 lb (77.565 kg)  BMI 30.30 kg/m2 Well nourished, well developed, in no acute distress HEENT: normal Neck: no JVD Cardiac:  normal S1, S2; RRR; 2/6 SM at LLSB to apex Lungs:  clear to auscultation bilaterally, no wheezing, rhonchi or rales Abd: soft, nontender, no hepatomegaly Ext: no edema Skin: warm and dry Neuro:  CNs 2-12 intact, no focal abnormalities noted  EKG:    NSR with LAFB and septal infarct   ASSESSMENT AND PLAN:  1. Chest pain with history of HTN, DM and family history of heart disease. - Stress myoveiw to rule out ischemia has been scheduled for tomorrow 2. Syncope of ? Etiology- may be neurological given recent MRI findings of possible MS - I will get an event monitor for 30 days to evaluate 3.  Heart murmur with recent echo showing Nl LVF and no valvular heart disease  Followup with me in 4 weeks  Signed, Fransico Him, MD 11/24/2013 11:48 AM

## 2013-11-24 NOTE — Patient Instructions (Signed)
Your physician recommends that you continue on your current medications as directed. Please refer to the Current Medication list given to you today.  Your physician has recommended that you wear an event monitor. Event monitors are medical devices that record the heart's electrical activity. Doctors most often [place] these monitors to diagnose arrhythmias. Arrhythmias are problems with the speed or rhythm of the heartbeat. The monitor is a small, portable device. You can wear one while you do your normal daily activities. This is usually used to diagnose what is causing palpitations/syncope (passing out).  Your physician recommends that you schedule a follow-up appointment in: 4 Weeks with Dr Radford Pax

## 2013-11-25 ENCOUNTER — Encounter (HOSPITAL_COMMUNITY): Payer: Self-pay

## 2013-11-26 ENCOUNTER — Ambulatory Visit: Payer: Self-pay | Admitting: Internal Medicine

## 2013-11-26 ENCOUNTER — Encounter: Payer: Self-pay | Admitting: *Deleted

## 2013-11-26 ENCOUNTER — Encounter (INDEPENDENT_AMBULATORY_CARE_PROVIDER_SITE_OTHER): Payer: BC Managed Care – PPO

## 2013-11-26 DIAGNOSIS — R55 Syncope and collapse: Secondary | ICD-10-CM

## 2013-11-26 NOTE — Progress Notes (Signed)
Patient ID: Brooke [name], female   DOB: [birth year], 63 y.o.   MRN: [id number] [employer] 30 day cardiac event monitor applied to patient.

## 2013-11-30 ENCOUNTER — Ambulatory Visit (HOSPITAL_COMMUNITY): Payer: BC Managed Care – PPO | Attending: Physician Assistant | Admitting: Radiology

## 2013-11-30 VITALS — BP 171/97 | HR 81 | Ht 63.0 in | Wt 169.0 lb

## 2013-11-30 DIAGNOSIS — R55 Syncope and collapse: Secondary | ICD-10-CM | POA: Insufficient documentation

## 2013-11-30 DIAGNOSIS — R0602 Shortness of breath: Secondary | ICD-10-CM

## 2013-11-30 DIAGNOSIS — R079 Chest pain, unspecified: Secondary | ICD-10-CM | POA: Insufficient documentation

## 2013-11-30 MED ORDER — TECHNETIUM TC 99M SESTAMIBI GENERIC - CARDIOLITE
30.0000 | Freq: Once | INTRAVENOUS | Status: AC | PRN
  Administered 2013-11-30: 30 via INTRAVENOUS

## 2013-11-30 MED ORDER — TECHNETIUM TC 99M SESTAMIBI GENERIC - CARDIOLITE
10.0000 | Freq: Once | INTRAVENOUS | Status: AC | PRN
  Administered 2013-11-30: 10 via INTRAVENOUS

## 2013-11-30 MED ORDER — REGADENOSON 0.4 MG/5ML IV SOLN
0.4000 mg | Freq: Once | INTRAVENOUS | Status: AC
  Administered 2013-11-30: 0.4 mg via INTRAVENOUS

## 2013-11-30 NOTE — Progress Notes (Signed)
[facility] [address] [place], [place] [address] [phone]    Cardiology Nuclear Med Study  Brooke [name] is a 63 y.o. female     MRN : [id number]     DOB: [birth year]  Procedure Date: 11/30/2013  Nuclear Med Background Indication for Stress Test:  Evaluation for Ischemia History:  No known CAD, Echo 2015 EF 55-60%, MPI 2010 EF 70% (mild apical thinning), Asthma Cardiac Risk Factors: Hypertension, Lipids and NIDDM  Symptoms:  Chest Pain (last date of chest discomfort was last week), Dizziness, DOE and Palpitations   Nuclear Pre-Procedure Caffeine/Decaff Intake:  None > 12 hrs NPO After: 8:00pm   Lungs:  clear O2 Sat: 97% on room air. IV 0.9% NS with Angio Cath:  22g  IV Site: R Antecubital x 1, tolerated well IV Started by:  Irven [place], RN  Chest Size (in):  34 Cup Size: D  Height: 5\' 3"  (1.6 m)  Weight:  169 lb (76.658 kg)  BMI:  Body mass index is 29.94 kg/(m^2). Tech Comments:  No medications (Glucophage) this am. Irven [place], RN.    Nuclear Med Study 1 or 2 day study: 1 day  Stress Test Type:  Lexiscan  Reading MD: N/A  Order Authorizing Provider:  Fransico Him, MD  Resting Radionuclide: Technetium 37m Sestamibi  Resting Radionuclide Dose: 11.0 mCi   Stress Radionuclide:  Technetium 66m Sestamibi  Stress Radionuclide Dose: 33.0 mCi           Stress Protocol Rest HR: 81 Stress HR: 106  Rest BP: 171/97 Stress BP: 157/86  Exercise Time (min): n/a METS: n/a           Dose of Adenosine (mg):  n/a Dose of Lexiscan: 0.4 mg  Dose of Atropine (mg): n/a Dose of Dobutamine: n/a mcg/kg/min (at max HR)  Stress Test Technologist: Glade Lloyd, BS-ES  Nuclear Technologist:  Charlton Amor, CNMT     Rest Procedure:  Myocardial perfusion imaging was performed at rest 45 minutes following the intravenous administration of Technetium 62m Sestamibi. Rest ECG: NSR - Normal EKG  Stress Procedure:  The patient received IV Lexiscan 0.4 mg  over 15-seconds.  Technetium 64m Sestamibi injected at 30-seconds.  Quantitative spect images were obtained after a 45 minute delay.  During the infusion of Lexiscan, the patient complained of being nauseated, feeling "funny" and a headache.  These symptoms began to resolve in recovery.  Stress ECG: No significant change from baseline ECG  QPS Raw Data Images:  Normal; no motion artifact; normal heart/lung ratio. Stress Images:  Normal homogeneous uptake in all areas of the myocardium. Rest Images:  Normal homogeneous uptake in all areas of the myocardium. Subtraction (SDS):  No evidence of ischemia. Transient Ischemic Dilatation (Normal <1.22):  1.13 Lung/Heart Ratio (Normal <0.45):  0.28  Quantitative Gated Spect Images QGS EDV:  72 ml QGS ESV:  25 ml  Impression Exercise Capacity:  Lexiscan with no exercise. BP Response:  Normal blood pressure response. Clinical Symptoms:  No chest pain. ECG Impression:  No significant ST segment change suggestive of ischemia. Comparison with Prior Nuclear Study: No significant change from previous study  Overall Impression:  Normal stress nuclear study.  LV Ejection Fraction: 65%.  LV Wall Motion:  NL LV Function; NL Wall Motion  Darlin Coco MD

## 2013-12-04 ENCOUNTER — Ambulatory Visit (INDEPENDENT_AMBULATORY_CARE_PROVIDER_SITE_OTHER): Payer: BC Managed Care – PPO | Admitting: Internal Medicine

## 2013-12-04 ENCOUNTER — Encounter: Payer: Self-pay | Admitting: Internal Medicine

## 2013-12-04 VITALS — BP 150/100 | HR 99 | Temp 97.6°F | Ht 63.0 in | Wt 172.5 lb

## 2013-12-04 DIAGNOSIS — R930 Abnormal findings on diagnostic imaging of skull and head, not elsewhere classified: Secondary | ICD-10-CM | POA: Insufficient documentation

## 2013-12-04 DIAGNOSIS — R55 Syncope and collapse: Secondary | ICD-10-CM

## 2013-12-04 DIAGNOSIS — R51 Headache: Secondary | ICD-10-CM

## 2013-12-04 DIAGNOSIS — R9389 Abnormal findings on diagnostic imaging of other specified body structures: Secondary | ICD-10-CM

## 2013-12-04 DIAGNOSIS — I10 Essential (primary) hypertension: Secondary | ICD-10-CM

## 2013-12-04 MED ORDER — METFORMIN HCL 500 MG PO TABS: ORAL_TABLET | ORAL | Status: DC

## 2013-12-04 MED ORDER — OLMESARTAN-AMLODIPINE-HCTZ 40-10-25 MG PO TABS: 1.0000 | ORAL_TABLET | Freq: Every day | ORAL | Status: DC

## 2013-12-04 MED ORDER — SIMVASTATIN 40 MG PO TABS: 40.0000 mg | ORAL_TABLET | Freq: Every day | ORAL | Status: DC

## 2013-12-04 NOTE — Assessment & Plan Note (Signed)
Etiology unclear, results neg to date on lab and imaging except ? Of incidental finding on MRI, cont card monitor and card f/u

## 2013-12-04 NOTE — Assessment & Plan Note (Signed)
Exam benign, for re-start BP med

## 2013-12-04 NOTE — Patient Instructions (Signed)
Since we dont have the tribenzor samples today, please take the Azor 10/40 mg pills at one per day (you have approximately 4 wks samples)  A prescription for the Tribenzor was sent as refill to [facility] are also given the discount card and the written prescription to use at the local phamacy if this works with [employer], so that the copay might be lower  Please continue all other medications as before, and refills have been done if requested. Please have the pharmacy call with any other refills you may need.  Please keep your appointments with your specialists as you have planned - event monitor, cardiology, and neurology  No other further new testing ordered today  Please return in 6 months, or sooner if needed

## 2013-12-04 NOTE — Assessment & Plan Note (Signed)
Has been referred to neurology, may need spinal cord imaging as well

## 2013-12-04 NOTE — Assessment & Plan Note (Signed)
No samples of tribenzor today avail, so gave azor 10/40 as she has no funds for any meds today, tribenzor refilled but will take some time to get in mail

## 2013-12-04 NOTE — Progress Notes (Signed)
Subjective:    Patient ID: Brooke [name], female    DOB: 28-Feb-1951, 63 y.o.   MRN: [id number]  HPI  Here to f/u; overall doing ok,  Pt denies chest pain, increased sob or doe, wheezing, orthopnea, PND, increased LE swelling, palpitations, dizziness or syncope.  Pt denies polydipsia, polyuria, or low sugar symptoms such as weakness or confusion improved with po intake.  Pt denies new neurological symptoms such as new headache, or facial or extremity weakness or numbness.   Pt states overall good compliance with meds, has been trying to follow lower cholesterol, diabetic diet, with wt overall stable,  but little exercise however.  Has had numerous testing recently, with abnormal MRI most signficant for ? Abnormality c/w MS?  Has been referred to neurology, has event monitor on today, with f/u card soon as well.  No new complaints except ongoing HA since being out of her tribenzor for just over a wk. Out of tribenzor for over a wk due to cost, and being required now to get her meds per [facility]  Past Medical History  Diagnosis Date  . ALOPECIA 12/30/2007  . ANEMIA-NOS 02/09/2008  . CHEST PAIN 02/28/2009  . COLD SORE 10/01/2007  . COLONIC POLYPS, HX OF 02/26/2007  . DIABETES MELLITUS, TYPE II 11/29/2008  . ECZEMA, ATOPIC DERMATITIS 09/19/2006  . FATIGUE 09/05/2009  . GERD 02/09/2008  . GOITER NOS 09/19/2006  . GOUT 08/09/2008  . HYPERLIPIDEMIA 10/01/2007  . HYPERTENSION, BENIGN SYSTEMIC 09/19/2006  . HYPERTENSION 02/26/2007  . INSOMNIA NOS 09/19/2006  . Other abnormal glucose 10/01/2007  . OTITIS EXTERNA, RIGHT 02/28/2009  . Pain in joint, lower leg 10/01/2007  . SINUSITIS- ACUTE-NOS 05/20/2009  . SKIN LESION 08/09/2008  . Unspecified asthma(493.90) 09/19/2006  . Urinary frequency 11/29/2008  . Wheezing 12/30/2009  . Impaired glucose tolerance 12/19/2010   Past Surgical History  Procedure Laterality Date  . Cholecystectomy  3.1.2004  . Abdominal hysterectomy  10/22/1999    BSO  . L ras re-stented -   03/08/2005  . L ras tx'd w/angioplasty  11/21/2003  . Left cataract surgury    . Radioactive iodine ablate  3/03    thyroid 10/07/2001  . Rastelli procedure  6/98 neg  . Split sleep study neg for sleep apnea  11/15/2003    reports that she has never smoked. She does not have any smokeless tobacco history on file. She reports that she does not drink alcohol or use illicit drugs. family history includes Diabetes in her mother; Heart disease in her father. Allergies  Allergen Reactions  . Ace Inhibitors     REACTION: Patient denies allery  . Codeine     REACTION: rash nausea  . Hydrocodone     Dizzy and nausea  . Tramadol     n/v   Current Outpatient Prescriptions on File Prior to Visit  Medication Sig Dispense Refill  . albuterol (PROVENTIL HFA;VENTOLIN HFA) 108 (90 BASE) MCG/ACT inhaler Inhale 2 puffs into the lungs every 6 (six) hours as needed for wheezing.  1 Inhaler  11  . ALPRAZolam (XANAX) 0.25 MG tablet Take 0.25 mg by mouth 2 (two) times daily as needed.        . benzonatate (TESSALON) 100 MG capsule       . budesonide (PULMICORT FLEXHALER) 180 MCG/ACT inhaler Inhale 2 puffs into the lungs 2 (two) times daily.  1 Inhaler  11  . COLCRYS 0.6 MG tablet TAKE 1 TABLET BY MOUTH EVERY DAY  90 tablet  3  . diclofenac sodium (VOLTAREN) 1 % GEL Apply three times to four times per day       . ibuprofen (ADVIL,MOTRIN) 400 MG tablet Take 1 tablet (400 mg total) by mouth every 6 (six) hours as needed for pain.  120 tablet  3  . ibuprofen (ADVIL,MOTRIN) 800 MG tablet Take 1 tablet (800 mg total) by mouth every 8 (eight) hours as needed for pain.  21 tablet  0  . montelukast (SINGULAIR) 10 MG tablet Take 10 mg by mouth daily.        Marland Kitchen omeprazole (PRILOSEC) 20 MG capsule Take 1 capsule (20 mg total) by mouth 2 (two) times daily.  180 capsule  3  . potassium chloride (KLOR-CON 10) 10 MEQ tablet Take 1 tablet (10 mEq total) by mouth 2 (two) times daily.  90 tablet  3  . zolpidem (AMBIEN) 10 MG  tablet Take 1 tablet (10 mg total) by mouth at bedtime as needed.  30 tablet  5   No current facility-administered medications on file prior to visit.     Review of Systems  Constitutional: Negative for unusual diaphoresis or other sweats  HENT: Negative for ringing in ear Eyes: Negative for double vision or worsening visual disturbance.  Respiratory: Negative for choking and stridor.   Gastrointestinal: Negative for vomiting or other signifcant bowel change Genitourinary: Negative for hematuria or decreased urine volume.  Musculoskeletal: Negative for other MSK pain or swelling Skin: Negative for color change and worsening wound.  Neurological: Negative for tremors and numbness other than noted  Psychiatric/Behavioral: Negative for decreased concentration or agitation other than above       Objective:   Physical Exam BP 150/100  Pulse 99  Temp(Src) 97.6 F (36.4 C) (Oral)  Ht 5\' 3"  (1.6 m)  Wt 172 lb 8 oz (78.245 kg)  BMI 30.56 kg/m2  SpO2 95% VS noted,  Constitutional: Pt appears well-developed, well-nourished.  HENT: Head: NCAT.  Right Ear: External ear normal.  Left Ear: External ear normal.  Eyes: . Pupils are equal, round, and reactive to light. Conjunctivae and EOM are normal Neck: Normal range of motion. Neck supple.  Cardiovascular: Normal rate and regular rhythm.   Pulmonary/Chest: Effort normal and breath sounds normal.  Abd:  Soft, NT, ND, + BS Neurological: Pt is alert. Not confused , motor grossly intact Skin: Skin is warm. No rash Psychiatric: Pt behavior is normal. No agitation.     Assessment & Plan:

## 2013-12-04 NOTE — Progress Notes (Signed)
Pre visit review using our clinic review tool, if applicable. No additional management support is needed unless otherwise documented below in the visit note. 

## 2013-12-21 ENCOUNTER — Ambulatory Visit: Payer: BC Managed Care – PPO | Admitting: Neurology

## 2013-12-24 ENCOUNTER — Encounter: Payer: Self-pay | Admitting: *Deleted

## 2013-12-25 ENCOUNTER — Telehealth: Payer: Self-pay | Admitting: Internal Medicine

## 2013-12-25 ENCOUNTER — Ambulatory Visit: Payer: BC Managed Care – PPO | Admitting: Cardiology

## 2013-12-25 MED ORDER — IBUPROFEN 400 MG PO TABS: 400.0000 mg | ORAL_TABLET | Freq: Four times a day (QID) | ORAL | Status: DC | PRN

## 2013-12-25 NOTE — Telephone Encounter (Signed)
Pt request ibuprofen to be send to [facility]. Please advise.

## 2014-01-01 ENCOUNTER — Encounter: Payer: Self-pay | Admitting: Neurology

## 2014-01-01 ENCOUNTER — Telehealth: Payer: Self-pay | Admitting: Cardiology

## 2014-01-01 ENCOUNTER — Ambulatory Visit (INDEPENDENT_AMBULATORY_CARE_PROVIDER_SITE_OTHER): Payer: BC Managed Care – PPO | Admitting: Neurology

## 2014-01-01 VITALS — BP 148/80 | HR 88 | Resp 18 | Ht 63.0 in | Wt 171.0 lb

## 2014-01-01 DIAGNOSIS — I472 Ventricular tachycardia: Secondary | ICD-10-CM

## 2014-01-01 DIAGNOSIS — E119 Type 2 diabetes mellitus without complications: Secondary | ICD-10-CM

## 2014-01-01 DIAGNOSIS — R9389 Abnormal findings on diagnostic imaging of other specified body structures: Secondary | ICD-10-CM

## 2014-01-01 DIAGNOSIS — I4729 Other ventricular tachycardia: Secondary | ICD-10-CM

## 2014-01-01 LAB — BUN: BUN: 11 mg/dL (ref 6–23)

## 2014-01-01 LAB — CREATININE, SERUM: Creat: 0.9 mg/dL (ref 0.50–1.10)

## 2014-01-01 MED ORDER — METOPROLOL SUCCINATE ER 25 MG PO TB24: 25.0000 mg | ORAL_TABLET | Freq: Every day | ORAL | Status: DC

## 2014-01-01 NOTE — Telephone Encounter (Signed)
Patient aware of appointment with Dr Caryl Comes. Sent to [facility] for scheduling of cardia MRI

## 2014-01-01 NOTE — Telephone Encounter (Signed)
Please order a Cardiac MRI with contrast to be read by Dr. Meda Coffee or Dr. Johnsie Cancel for polymorphic VT

## 2014-01-01 NOTE — Addendum Note (Signed)
Addended by: Alvina Filbert B on: 01/01/2014 06:54 PM   Modules accepted: Orders

## 2014-01-01 NOTE — Telephone Encounter (Signed)
Please let patient know that she has had several episodes where she had a concerning heart rhythm from the bottom of her heart.  I have recommended that she start Toprol XL 25mg  daily.  Please tell her that she needs to call us immediately if she has another syncope epidsode.  Please get her in to see Dr. Lovena Le for polymorphic VT ASAP next week - this was discussed with Dr. Lovena Le.

## 2014-01-01 NOTE — Progress Notes (Signed)
NEUROLOGY CONSULTATION NOTE  [name] MRN: [id number] DOB: 03-May-1951  Referring provider: Dr. Jenny Reichmann Primary care provider: Dr. Jenny Reichmann  Reason for consult:  Abnormal MRI   HISTORY OF PRESENT ILLNESS: Brooke [name] is a 63 year old right-handed woman with history of hypertension, hyperlipidemia, type II diabetes mellitus, goiter, GERD, anemia and fatigue who presents for abnormal MRI.  Records and images personally reviewed.  On 09/17/13, she had lost consciousness while at work.  She was unconscious for 5 minutes and woke up on the floor.  She had to crawl for help.  Following this, she began having recurrent headache and chest pain radiating into the right arm.  She was evaluated by cardiology for syhcope.  She continues to have recurrent episodes of dizziness.  Initially they were associated with nausea, vomiting and diarrhea, but not any longer.  They last 10 minutes and occur daily.  They occur spontaneously and are not positional.  If she is standing during a spell, she becomes off balance and will veer towards the right.  She denies difficulty using her right hand.  She denies focal weakness, numbness.  MRI of the brain without contrast was performed on 11/20/13, revealing 1.3 cm region of increased FLAIR and T2 signal with mild swelling at the junction of the right lateral pons and right middle cerebellar peduncle and without restricted diffusion.  Impression discussed possibility of MS. She also had few foci of signal abnormality in the cerebral hemispheres as well.  She denies prior episodes of neurologic disturbance. She denies prior episodes of transient vision loss. There is no family history of MS. Her son does have a diagnosis of neurofibromatosis.  PAST MEDICAL HISTORY: Past Medical History  Diagnosis Date  . ALOPECIA 12/30/2007  . ANEMIA-NOS 02/09/2008  . CHEST PAIN 02/28/2009  . COLD SORE 10/01/2007  . COLONIC POLYPS, HX OF 02/26/2007  . DIABETES MELLITUS, TYPE II 11/29/2008  .  ECZEMA, ATOPIC DERMATITIS 09/19/2006  . FATIGUE 09/05/2009  . GERD 02/09/2008  . GOITER NOS 09/19/2006  . GOUT 08/09/2008  . HYPERLIPIDEMIA 10/01/2007  . HYPERTENSION, BENIGN SYSTEMIC 09/19/2006  . HYPERTENSION 02/26/2007  . INSOMNIA NOS 09/19/2006  . Other abnormal glucose 10/01/2007  . OTITIS EXTERNA, RIGHT 02/28/2009  . Pain in joint, lower leg 10/01/2007  . SINUSITIS- ACUTE-NOS 05/20/2009  . SKIN LESION 08/09/2008  . Unspecified asthma(493.90) 09/19/2006  . Urinary frequency 11/29/2008  . Wheezing 12/30/2009  . Impaired glucose tolerance 12/19/2010    PAST SURGICAL HISTORY: Past Surgical History  Procedure Laterality Date  . Cholecystectomy  3.1.2004  . Abdominal hysterectomy  10/22/1999    BSO  . L ras re-stented -  03/08/2005  . L ras tx'd w/angioplasty  11/21/2003  . Left cataract surgury    . Radioactive iodine ablate  3/03    thyroid 10/07/2001  . Rastelli procedure  6/98 neg  . Split sleep study neg for sleep apnea  11/15/2003    MEDICATIONS: Current Outpatient Prescriptions on File Prior to Visit  Medication Sig Dispense Refill  . albuterol (PROVENTIL HFA;VENTOLIN HFA) 108 (90 BASE) MCG/ACT inhaler Inhale 2 puffs into the lungs every 6 (six) hours as needed for wheezing.  1 Inhaler  11  . benzonatate (TESSALON) 100 MG capsule Take 100 mg by mouth 2 (two) times daily as needed.       . budesonide (PULMICORT FLEXHALER) 180 MCG/ACT inhaler Inhale 2 puffs into the lungs 2 (two) times daily.  1 Inhaler  11  . COLCRYS 0.6 MG  tablet TAKE 1 TABLET BY MOUTH EVERY DAY  90 tablet  3  . ibuprofen (ADVIL,MOTRIN) 400 MG tablet Take 1 tablet (400 mg total) by mouth every 6 (six) hours as needed.  120 tablet  3  . metFORMIN (GLUCOPHAGE) 500 MG tablet TAKE 1 TABLET BY MOUTH EVERY DAY  90 tablet  3  . montelukast (SINGULAIR) 10 MG tablet Take 10 mg by mouth daily.        . Olmesartan-Amlodipine-HCTZ (TRIBENZOR) 40-10-25 MG TABS Take 1 tablet by mouth daily.  90 tablet  3  . omeprazole (PRILOSEC) 20 MG  capsule Take 1 capsule (20 mg total) by mouth 2 (two) times daily.  180 capsule  3  . potassium chloride (KLOR-CON 10) 10 MEQ tablet Take 1 tablet (10 mEq total) by mouth 2 (two) times daily.  90 tablet  3  . simvastatin (ZOCOR) 40 MG tablet Take 1 tablet (40 mg total) by mouth daily.  90 tablet  3  . ALPRAZolam (XANAX) 0.25 MG tablet Take 0.25 mg by mouth 2 (two) times daily as needed.        . zolpidem (AMBIEN) 10 MG tablet Take 1 tablet (10 mg total) by mouth at bedtime as needed.  30 tablet  5   No current facility-administered medications on file prior to visit.    ALLERGIES: Allergies  Allergen Reactions  . Ace Inhibitors     REACTION: Patient denies allery  . Codeine     REACTION: rash nausea  . Hydrocodone     Dizzy and nausea  . Tramadol     n/v    FAMILY HISTORY: Family History  Problem Relation Age of Onset  . Diabetes Mother   . Heart disease Father     SOCIAL HISTORY: History   Social History  . Marital Status: Married    Spouse Name: N/A    Number of Children: 2  . Years of Education: N/A   Occupational History  . CNA    Social History Main Topics  . Smoking status: Never Smoker   . Smokeless tobacco: Not on file  . Alcohol Use: No  . Drug Use: No  . Sexual Activity: Not on file   Other Topics Concern  . Not on file   Social History Narrative  . No narrative on file    REVIEW OF SYSTEMS: Constitutional: No fevers, chills, or sweats, no generalized fatigue, change in appetite Eyes: No visual changes, double vision, eye pain Ear, nose and throat: No hearing loss, ear pain, nasal congestion, sore throat Cardiovascular: No chest pain, palpitations Respiratory:  No shortness of breath at rest or with exertion, wheezes GastrointestinaI: No nausea, vomiting, diarrhea, abdominal pain, fecal incontinence Genitourinary:  No dysuria, urinary retention or frequency Musculoskeletal:  No neck pain, back pain Integumentary: No rash, pruritus, skin  lesions Neurological: as above Psychiatric: No depression, insomnia, anxiety Endocrine: No palpitations, fatigue, diaphoresis, mood swings, change in appetite, change in weight, increased thirst Hematologic/Lymphatic:  No anemia, purpura, petechiae. Allergic/Immunologic: no itchy/runny eyes, nasal congestion, recent allergic reactions, rashes  PHYSICAL EXAM: Filed Vitals:   01/01/14 0914  BP: 148/80  Pulse: 88  Resp: 18   General: No acute distress Head:  Normocephalic/atraumatic Neck: supple, no paraspinal tenderness, full range of motion Back: No paraspinal tenderness Heart: regular rate and rhythm Lungs: Clear to auscultation bilaterally. Vascular: No carotid bruits. Neurological Exam: Mental status: alert and oriented to person, place, and time, recent and remote memory intact, fund of knowledge intact, attention  and concentration intact, speech fluent and not dysarthric, language intact. Cranial nerves: CN I: not tested CN II: right pupilround and reactive to light, left pupil surgical, visual fields intact, fundi unremarkable, without vessel changes, exudates, hemorrhages or papilledema. CN III, IV, VI:  Left exophthalmos.  Left eye hypertropic and abducted on primary gaze.  Reduced adduction of the left eye on extraocular muscle testing (these findings are reportedly chronic), no nystagmus, no ptosis CN V: facial sensation intact CN VII: upper and lower face symmetric CN VIII: hearing intact CN IX, X: gag intact, uvula midline CN XI: sternocleidomastoid and trapezius muscles intact CN XII: tongue midline Bulk & Tone: normal, no fasciculations. Motor: 5/5 throughout Sensation:  Pinprick and vibration intact Deep Tendon Reflexes: 2+ throughout except absent in the ankles. Toes downgoing. Finger to nose testing: No tremor or dysmetria Heel to shin: No dysmetria Gait: Genu valgum posture.  No ataxia.  Able to turn.  Unable to walk on toes, heels or in tandem. Romberg  negative.  IMPRESSION: Abnormal MRI of the brain involving the right lateral pons and middle cerebellar peduncle. Cannot say that this is demyelinating lesion. She exhibits no history to suggest MS. Although not in a typical vascular distribution, could a possible stroke in the [place] territory be possible.  PLAN: 1. MRI of the brain with contrast, as well as MRA of the head. 2. Would repeat MRI of the brain with and without contrast in 3-4 months to look for any significant change. 3. Would like to have Ms. Smith followup following repeat imaging to discuss any further management if needed. 4. Recommended starting aspirin 81 mg daily.  Thank you for allowing me to take part in the care of this patient.  Metta Clines, DO  CC: [name], MD

## 2014-01-01 NOTE — Telephone Encounter (Signed)
Advised patient

## 2014-01-01 NOTE — Patient Instructions (Addendum)
1.  We will get MRI of brain with contrast and MRA of head. 01/12/14 9:45am at [facility]   2.  Ultimately, I would repeat MRI of the brain in 3 months.  Follow up soon after that. 3.  81 mg Aspirin daily

## 2014-01-01 NOTE — Telephone Encounter (Signed)
Advised patient and will send to [facility] for scheduling

## 2014-01-04 ENCOUNTER — Encounter: Payer: Self-pay | Admitting: Internal Medicine

## 2014-01-04 ENCOUNTER — Telehealth: Payer: Self-pay | Admitting: Cardiology

## 2014-01-04 ENCOUNTER — Encounter (INDEPENDENT_AMBULATORY_CARE_PROVIDER_SITE_OTHER): Payer: Self-pay

## 2014-01-04 ENCOUNTER — Ambulatory Visit (INDEPENDENT_AMBULATORY_CARE_PROVIDER_SITE_OTHER): Payer: BC Managed Care – PPO | Admitting: Internal Medicine

## 2014-01-04 VITALS — BP 152/79 | HR 91 | Ht 63.0 in | Wt 170.0 lb

## 2014-01-04 DIAGNOSIS — I472 Ventricular tachycardia: Secondary | ICD-10-CM

## 2014-01-04 DIAGNOSIS — I4729 Other ventricular tachycardia: Secondary | ICD-10-CM

## 2014-01-04 HISTORY — DX: Other ventricular tachycardia: I47.29

## 2014-01-04 HISTORY — DX: Ventricular tachycardia: I47.2

## 2014-01-04 NOTE — Telephone Encounter (Signed)
Discussed with Dr. Caryl Comes - he recommends proceeding with left heart cath to evaluate coronary anatomy given her polymorphic VT despite normal LVF and nuclear stress test.  Please set up for this week.

## 2014-01-04 NOTE — Progress Notes (Signed)
ELECTROPHYSIOLOGY CONSULT NOTE  Patient ID: Brooke [name], MRN: [id number], DOB/AGE: Aug 26, 1950 63 y.o. Admit date: (Not on file) Date of Consult: 01/04/2014  Primary Physician: [name], MD Primary Cardiologist: TT  Chief Complaint: Ventricular tachycardia   HPI Brooke [name] is a 63 y.o. female  Referred for evaluation of polymorphic ventricular tachycardia identified on an event recorder undertaken because of syncope.  She has a history of hypertension and diabetes and dyslipidemia. In February, she has syncopal episode. She been taking care of the patient. She walked out in the hall. She felt hot and collapse. She awakened on the floor and was able to drag herself to the bathroom. Previous notes say 5 minutes. However, there was no one there she has no idea whether with 5 seconds or 5 minutes.  She has had no prior syncope. No family history of syncope or sudden death of which she is aware.  Cardiac evaluation has included an echocardiogram that was normal a Myoview scan that was normal with normal left ventricular function it recorder which demonstrated PVCs often occurring in couplets and 2 episodes of PVC triggered polymorphic ventricular tachycardia 1 occurred at night and was asymptomatic it was associated with dizziness.  She has marked limitations in exercise tolerance because of a problem with her knee. She denies chest pain. She does have some shortness of breath. She is also some peripheral edema      Past Medical History  Diagnosis Date  . ALOPECIA 12/30/2007  . ANEMIA-NOS 02/09/2008  . CHEST PAIN 02/28/2009  . COLD SORE 10/01/2007  . COLONIC POLYPS, HX OF 02/26/2007  . DIABETES MELLITUS, TYPE II 11/29/2008  . ECZEMA, ATOPIC DERMATITIS 09/19/2006  . FATIGUE 09/05/2009  . GERD 02/09/2008  . GOITER NOS 09/19/2006  . GOUT 08/09/2008  . HYPERLIPIDEMIA 10/01/2007  . HYPERTENSION, BENIGN SYSTEMIC 09/19/2006  . HYPERTENSION 02/26/2007  . INSOMNIA NOS 09/19/2006  . Other  abnormal glucose 10/01/2007  . OTITIS EXTERNA, RIGHT 02/28/2009  . Pain in joint, lower leg 10/01/2007  . SINUSITIS- ACUTE-NOS 05/20/2009  . SKIN LESION 08/09/2008  . Unspecified asthma(493.90) 09/19/2006  . Urinary frequency 11/29/2008  . Wheezing 12/30/2009  . Impaired glucose tolerance 12/19/2010  . Syncope 11/06/2013  . Ventricular tachycardia, polymorphic 01/04/2014      Surgical History:  Past Surgical History  Procedure Laterality Date  . Cholecystectomy  3.1.2004  . Abdominal hysterectomy  10/22/1999    BSO  . L ras re-stented -  03/08/2005  . L ras tx'd w/angioplasty  11/21/2003  . Left cataract surgury    . Radioactive iodine ablate  3/03    thyroid 10/07/2001  . Rastelli procedure  6/98 neg  . Split sleep study neg for sleep apnea  11/15/2003     Home Meds: Prior to Admission medications   Medication Sig Start Date End Date Taking? Authorizing Provider  albuterol (PROVENTIL HFA;VENTOLIN HFA) 108 (90 BASE) MCG/ACT inhaler Inhale 2 puffs into the lungs every 6 (six) hours as needed for wheezing. 04/02/13  Yes Biagio Borg, MD  ALPRAZolam Duanne Moron) 0.25 MG tablet Take 0.25 mg by mouth 2 (two) times daily as needed.     Yes Historical Provider, MD  benzonatate (TESSALON) 100 MG capsule Take 100 mg by mouth 2 (two) times daily as needed.  10/28/13  Yes Historical Provider, MD  budesonide (PULMICORT FLEXHALER) 180 MCG/ACT inhaler Inhale 2 puffs into the lungs 2 (two) times daily. 04/07/13  Yes Biagio Borg, MD  COLCRYS 0.6 MG  tablet TAKE 1 TABLET BY MOUTH EVERY DAY   Yes Biagio Borg, MD  ibuprofen (ADVIL,MOTRIN) 400 MG tablet Take 1 tablet (400 mg total) by mouth every 6 (six) hours as needed. 12/25/13  Yes Biagio Borg, MD  metFORMIN (GLUCOPHAGE) 500 MG tablet TAKE 1 TABLET BY MOUTH EVERY DAY 12/04/13  Yes Biagio Borg, MD  metoprolol succinate (TOPROL XL) 25 MG 24 hr tablet Take 1 tablet (25 mg total) by mouth daily. 01/01/14  Yes Sueanne Margarita, MD  montelukast (SINGULAIR) 10 MG tablet Take 10 mg  by mouth daily.     Yes Historical Provider, MD  Olmesartan-Amlodipine-HCTZ (TRIBENZOR) 40-10-25 MG TABS Take 1 tablet by mouth daily. 12/04/13  Yes Biagio Borg, MD  omeprazole (PRILOSEC) 20 MG capsule Take 1 capsule (20 mg total) by mouth 2 (two) times daily. 09/10/12  Yes Biagio Borg, MD  potassium chloride (KLOR-CON 10) 10 MEQ tablet Take 1 tablet (10 mEq total) by mouth 2 (two) times daily. 08/18/12  Yes Biagio Borg, MD  simvastatin (ZOCOR) 40 MG tablet Take 1 tablet (40 mg total) by mouth daily. 12/04/13  Yes Biagio Borg, MD  zolpidem (AMBIEN) 10 MG tablet Take 1 tablet (10 mg total) by mouth at bedtime as needed. 07/18/11  Yes Biagio Borg, MD      Allergies:  Allergies  Allergen Reactions  . Ace Inhibitors     REACTION: Patient denies allery  . Codeine     REACTION: rash nausea  . Hydrocodone     Dizzy and nausea  . Tramadol     n/v    History   Social History  . Marital Status: Married    Spouse Name: N/A    Number of Children: 2  . Years of Education: N/A   Occupational History  . CNA    Social History Main Topics  . Smoking status: Never Smoker   . Smokeless tobacco: Not on file  . Alcohol Use: No  . Drug Use: No  . Sexual Activity: Not on file   Other Topics Concern  . Not on file   Social History Narrative  . No narrative on file     Family History  Problem Relation Age of Onset  . Diabetes Mother   . Heart disease Father      ROS:  Please see the history of present illness.     All other systems reviewed and negative.    Physical Exam: Blood pressure 152/79, pulse 91, height 5\' 3"  (1.6 m), weight 170 lb (77.111 kg). General: Well developed, well nourished female in no acute distress. Head: Normocephalic, atraumatic, sclera non-icteric, no xanthomas, nares are without discharge. EENT: normal Lymph Nodes:  none Back: without scoliosis/kyphosis , no CVA tendersness Neck: Negative for carotid bruits. JVD not elevated. Lungs: Clear bilaterally  to auscultation without wheezes, rales, or rhonchi. Breathing is unlabored. Heart: RRR with diminished S1 S2. 2/6  murmur ,+S4 , or gallops appreciated. Abdomen: Soft, non-tender, non-distended with normoactive bowel sounds. No hepatomegaly. No rebound/guarding. No obvious abdominal masses. Msk:  Strength and tone appear normal for age. Extremities: No clubbing or cyanosis. No * edema.  Distal pedal pulses are 2+ and equal bilaterally. Skin: Warm and Dry Neuro: Alert and oriented X 3. CN v- XII intact Grossly normal sensory and motor function .she has exotropia Psych:  Responds to questions appropriately with a normal affect.      Labs: Cardiac Enzymes No results found for this  basename: CKTOTAL, CKMB, TROPONINI,  in the last 72 hours CBC Lab Results  Component Value Date   WBC 7.6 11/06/2013   HGB 12.5 11/06/2013   HCT 37.5 11/06/2013   MCV 81.9 11/06/2013   PLT 213.0 11/06/2013   PROTIME: No results found for this basename: LABPROT, INR,  in the last 72 hours Chemistry   Recent Labs Lab 01/01/14 1046  BUN 11  CREATININE 0.90   Lipids Lab Results  Component Value Date   CHOL 130 11/06/2013   HDL 43.80 11/06/2013   LDLCALC 74 11/06/2013   TRIG 60.0 11/06/2013   BNP No results found for this basename: probnp   Miscellaneous No results found for this basename: DDIMER    Radiology/Studies:  No results found.  EKG: *Sinus rhythm at 94 Intervals 15/12/37 Left axis deviation    poor R-wave progression  Assessment and Plan:   Nonsustained ventricular tachycardia-polymorphic  syncope  PVCs  The patient has nonsustained polymorphic ventricular tachycardia triggered by the same PVCs that she is having.   Excluding significant structural heart disease is important and to that end we'll undertake a signal average ECG cardiac MRI were also we'll pursue catheterization notwithstanding her negative Myoview.  If the aforementioned tests are negative, suppression of her  ectopic beats perhaps with flecainide might suffice to prevent the emergence of polymorphic VT alternatively we could consider catheter ablation.     Virl Axe

## 2014-01-04 NOTE — Patient Instructions (Signed)
No follow up is needed at this time with Dr. Caryl Comes. Dr. Caryl Comes will speak with Dr. Radford Pax later today about your case.

## 2014-01-05 ENCOUNTER — Encounter: Payer: Self-pay | Admitting: General Surgery

## 2014-01-05 ENCOUNTER — Telehealth: Payer: Self-pay | Admitting: Cardiology

## 2014-01-05 ENCOUNTER — Other Ambulatory Visit: Payer: Self-pay | Admitting: General Surgery

## 2014-01-05 ENCOUNTER — Other Ambulatory Visit (INDEPENDENT_AMBULATORY_CARE_PROVIDER_SITE_OTHER): Payer: BC Managed Care – PPO

## 2014-01-05 DIAGNOSIS — I472 Ventricular tachycardia, unspecified: Secondary | ICD-10-CM

## 2014-01-05 DIAGNOSIS — I4729 Other ventricular tachycardia: Secondary | ICD-10-CM

## 2014-01-05 LAB — CBC WITH DIFFERENTIAL/PLATELET
BASOS PCT: 0.4 % (ref 0.0–3.0)
Basophils Absolute: 0 10*3/uL (ref 0.0–0.1)
EOS ABS: 0.1 10*3/uL (ref 0.0–0.7)
Eosinophils Relative: 1.5 % (ref 0.0–5.0)
HCT: 40.8 % (ref 36.0–46.0)
Hemoglobin: 13.1 g/dL (ref 12.0–15.0)
LYMPHS PCT: 30.4 % (ref 12.0–46.0)
Lymphs Abs: 2.2 10*3/uL (ref 0.7–4.0)
MCHC: 32.2 g/dL (ref 30.0–36.0)
MCV: 84.2 fl (ref 78.0–100.0)
MONOS PCT: 9.1 % (ref 3.0–12.0)
Monocytes Absolute: 0.6 10*3/uL (ref 0.1–1.0)
NEUTROS ABS: 4.1 10*3/uL (ref 1.4–7.7)
NEUTROS PCT: 58.6 % (ref 43.0–77.0)
Platelets: 235 10*3/uL (ref 150.0–400.0)
RBC: 4.85 Mil/uL (ref 3.87–5.11)
RDW: 13.6 % (ref 11.5–15.5)
WBC: 7.1 10*3/uL (ref 4.0–10.5)

## 2014-01-05 LAB — BASIC METABOLIC PANEL
BUN: 13 mg/dL (ref 6–23)
CHLORIDE: 102 meq/L (ref 96–112)
CO2: 30 mEq/L (ref 19–32)
CREATININE: 1 mg/dL (ref 0.4–1.2)
Calcium: 9.8 mg/dL (ref 8.4–10.5)
GFR: 72.25 mL/min (ref >60.00)
Glucose, Bld: 96 mg/dL (ref 70–99)
POTASSIUM: 4.1 meq/L (ref 3.5–5.1)
Sodium: 139 mEq/L (ref 135–145)

## 2014-01-05 NOTE — Telephone Encounter (Signed)
What diagnosis code would you like me to use>?

## 2014-01-05 NOTE — Telephone Encounter (Signed)
Ventricular tachycardia

## 2014-01-05 NOTE — Telephone Encounter (Signed)
LVM for pt to return call

## 2014-01-05 NOTE — Telephone Encounter (Signed)
New Message:  Pt states she is returning a call to [place].

## 2014-01-05 NOTE — Telephone Encounter (Signed)
Pt is aware and is set up with Dr Martinique on Thursday June 18th at 12:00. Paper work filled out and went over instructions with the pt.

## 2014-01-06 ENCOUNTER — Encounter (HOSPITAL_COMMUNITY): Payer: Self-pay | Admitting: Pharmacy Technician

## 2014-01-07 ENCOUNTER — Encounter (HOSPITAL_COMMUNITY)
Admission: RE | Disposition: A | Discharge status: Home / Self Care | Payer: Self-pay | Source: Ambulatory Visit | Attending: Cardiology

## 2014-01-07 ENCOUNTER — Ambulatory Visit (HOSPITAL_COMMUNITY)
Admission: RE | Admit: 2014-01-07 | Discharge: 2014-01-07 | Disposition: A | Discharge status: Home / Self Care | Payer: BC Managed Care – PPO | Source: Ambulatory Visit | Attending: Cardiology | Admitting: Cardiology

## 2014-01-07 DIAGNOSIS — J45909 Unspecified asthma, uncomplicated: Secondary | ICD-10-CM | POA: Insufficient documentation

## 2014-01-07 DIAGNOSIS — I472 Ventricular tachycardia, unspecified: Secondary | ICD-10-CM | POA: Insufficient documentation

## 2014-01-07 DIAGNOSIS — I10 Essential (primary) hypertension: Secondary | ICD-10-CM | POA: Insufficient documentation

## 2014-01-07 DIAGNOSIS — I4729 Other ventricular tachycardia: Secondary | ICD-10-CM | POA: Insufficient documentation

## 2014-01-07 DIAGNOSIS — K219 Gastro-esophageal reflux disease without esophagitis: Secondary | ICD-10-CM | POA: Insufficient documentation

## 2014-01-07 DIAGNOSIS — E119 Type 2 diabetes mellitus without complications: Secondary | ICD-10-CM | POA: Insufficient documentation

## 2014-01-07 DIAGNOSIS — E785 Hyperlipidemia, unspecified: Secondary | ICD-10-CM | POA: Insufficient documentation

## 2014-01-07 DIAGNOSIS — M109 Gout, unspecified: Secondary | ICD-10-CM | POA: Insufficient documentation

## 2014-01-07 HISTORY — PX: LEFT HEART CATHETERIZATION WITH CORONARY ANGIOGRAM: SHX5451

## 2014-01-07 LAB — GLUCOSE, CAPILLARY
Glucose-Capillary: 103 mg/dL — ABNORMAL HIGH (ref 70–99)
Glucose-Capillary: 89 mg/dL (ref 70–99)

## 2014-01-07 LAB — PROTIME-INR
INR: 0.95 (ref 0.00–1.49)
PROTHROMBIN TIME: 12.5 s (ref 11.6–15.2)

## 2014-01-07 SURGERY — LEFT HEART CATHETERIZATION WITH CORONARY ANGIOGRAM
Anesthesia: LOCAL

## 2014-01-07 MED ORDER — MIDAZOLAM HCL 2 MG/2ML IJ SOLN
INTRAMUSCULAR | Status: AC
  Filled 2014-01-07: qty 2

## 2014-01-07 MED ORDER — ASPIRIN 81 MG PO CHEW
81.0000 mg | CHEWABLE_TABLET | ORAL | Status: AC
  Administered 2014-01-07: 81 mg via ORAL

## 2014-01-07 MED ORDER — HEPARIN SODIUM (PORCINE) 1000 UNIT/ML IJ SOLN
INTRAMUSCULAR | Status: AC
  Filled 2014-01-07: qty 1

## 2014-01-07 MED ORDER — SODIUM CHLORIDE 0.9 % IV SOLN
INTRAVENOUS | Status: DC
  Administered 2014-01-07: 11:00:00 via INTRAVENOUS

## 2014-01-07 MED ORDER — NITROGLYCERIN 0.2 MG/ML ON CALL CATH LAB
INTRAVENOUS | Status: AC
  Filled 2014-01-07: qty 1

## 2014-01-07 MED ORDER — SODIUM CHLORIDE 0.9 % IV SOLN: 1.0000 mL/kg/h | INTRAVENOUS | Status: DC

## 2014-01-07 MED ORDER — ASPIRIN 81 MG PO CHEW
CHEWABLE_TABLET | ORAL | Status: AC
  Administered 2014-01-07: 81 mg via ORAL
  Filled 2014-01-07: qty 1

## 2014-01-07 MED ORDER — VERAPAMIL HCL 2.5 MG/ML IV SOLN
INTRAVENOUS | Status: AC
  Filled 2014-01-07: qty 2

## 2014-01-07 MED ORDER — LIDOCAINE HCL (PF) 1 % IJ SOLN
INTRAMUSCULAR | Status: AC
  Filled 2014-01-07: qty 30

## 2014-01-07 MED ORDER — SODIUM CHLORIDE 0.9 % IJ SOLN
3.0000 mL | INTRAMUSCULAR | Status: DC | PRN
Start: 2014-01-07 — End: 2014-01-07

## 2014-01-07 MED ORDER — HEPARIN (PORCINE) IN NACL 2-0.9 UNIT/ML-% IJ SOLN
INTRAMUSCULAR | Status: AC
  Filled 2014-01-07: qty 1000

## 2014-01-07 MED ORDER — SODIUM CHLORIDE 0.9 % IJ SOLN: 3.0000 mL | Freq: Two times a day (BID) | INTRAMUSCULAR | Status: DC

## 2014-01-07 MED ORDER — FENTANYL CITRATE 0.05 MG/ML IJ SOLN
INTRAMUSCULAR | Status: AC
  Filled 2014-01-07: qty 2

## 2014-01-07 MED ORDER — SODIUM CHLORIDE 0.9 % IV SOLN
250.0000 mL | INTRAVENOUS | Status: DC | PRN
Start: 2014-01-07 — End: 2014-01-07

## 2014-01-07 NOTE — Discharge Instructions (Addendum)
Hold Metformin for 2 days. May resume on Sunday 01/10/14   Radial Site Care Refer to this sheet in the next few weeks. These instructions provide you with information on caring for yourself after your procedure. Your caregiver may also give you more specific instructions. Your treatment has been planned according to current medical practices, but problems sometimes occur. Call your caregiver if you have any problems or questions after your procedure. HOME CARE INSTRUCTIONS  You may shower the day after the procedure.Remove the bandage (dressing) and gently wash the site with plain soap and water.Gently pat the site dry.  Do not apply powder or lotion to the site.  Do not submerge the affected site in water for 3 to 5 days.  Inspect the site at least twice daily.  Do not flex or bend the affected arm for 24 hours.  No lifting over 5 pounds (2.3 kg) for 5 days after your procedure.  Do not drive home if you are discharged the same day of the procedure. Have someone else drive you.  You may drive 24 hours after the procedure unless otherwise instructed by your caregiver.  Do not operate machinery or power tools for 24 hours.  A responsible adult should be with you for the first 24 hours after you arrive home. What to expect:  Any bruising will usually fade within 1 to 2 weeks.  Blood that collects in the tissue (hematoma) may be painful to the touch. It should usually decrease in size and tenderness within 1 to 2 weeks. SEEK IMMEDIATE MEDICAL CARE IF:  You have unusual pain at the radial site.  You have redness, warmth, swelling, or pain at the radial site.  You have drainage (other than a small amount of blood on the dressing).  You have chills.  You have a fever or persistent symptoms for more than 72 hours.  You have a fever and your symptoms suddenly get worse.  Your arm becomes pale, cool, tingly, or numb.  You have heavy bleeding from the site. Hold pressure on the  site. Document Released: 08/11/2010 Document Revised: 10/01/2011 Document Reviewed: 08/11/2010 [facility] Patient Information 2015 [place], [place]. This information is not intended to replace advice given to you by your health care provider. Make sure you discuss any questions you have with your health care provider.

## 2014-01-07 NOTE — H&P (View-Only) (Signed)
ELECTROPHYSIOLOGY CONSULT NOTE  Patient ID: Brooke [name], MRN: [id number], DOB/AGE: December 23, 1950 63 y.o. Admit date: (Not on file) Date of Consult: 01/04/2014  Primary Physician: [name], MD Primary Cardiologist: TT  Chief Complaint: Ventricular tachycardia   HPI ROSIA Brooke [name] is a 63 y.o. female  Referred for evaluation of polymorphic ventricular tachycardia identified on an event recorder undertaken because of syncope.  She has a history of hypertension and diabetes and dyslipidemia. In February, she has syncopal episode. She been taking care of the patient. She walked out in the hall. She felt hot and collapse. She awakened on the floor and was able to drag herself to the bathroom. Previous notes say 5 minutes. However, there was no one there she has no idea whether with 5 seconds or 5 minutes.  She has had no prior syncope. No family history of syncope or sudden death of which she is aware.  Cardiac evaluation has included an echocardiogram that was normal a Myoview scan that was normal with normal left ventricular function it recorder which demonstrated PVCs often occurring in couplets and 2 episodes of PVC triggered polymorphic ventricular tachycardia 1 occurred at night and was asymptomatic it was associated with dizziness.  She has marked limitations in exercise tolerance because of a problem with her knee. She denies chest pain. She does have some shortness of breath. She is also some peripheral edema      Past Medical History  Diagnosis Date  . ALOPECIA 12/30/2007  . ANEMIA-NOS 02/09/2008  . CHEST PAIN 02/28/2009  . COLD SORE 10/01/2007  . COLONIC POLYPS, HX OF 02/26/2007  . DIABETES MELLITUS, TYPE II 11/29/2008  . ECZEMA, ATOPIC DERMATITIS 09/19/2006  . FATIGUE 09/05/2009  . GERD 02/09/2008  . GOITER NOS 09/19/2006  . GOUT 08/09/2008  . HYPERLIPIDEMIA 10/01/2007  . HYPERTENSION, BENIGN SYSTEMIC 09/19/2006  . HYPERTENSION 02/26/2007  . INSOMNIA NOS 09/19/2006  . Other  abnormal glucose 10/01/2007  . OTITIS EXTERNA, RIGHT 02/28/2009  . Pain in joint, lower leg 10/01/2007  . SINUSITIS- ACUTE-NOS 05/20/2009  . SKIN LESION 08/09/2008  . Unspecified asthma(493.90) 09/19/2006  . Urinary frequency 11/29/2008  . Wheezing 12/30/2009  . Impaired glucose tolerance 12/19/2010  . Syncope 11/06/2013  . Ventricular tachycardia, polymorphic 01/04/2014      Surgical History:  Past Surgical History  Procedure Laterality Date  . Cholecystectomy  3.1.2004  . Abdominal hysterectomy  10/22/1999    BSO  . L ras re-stented -  03/08/2005  . L ras tx'd w/angioplasty  11/21/2003  . Left cataract surgury    . Radioactive iodine ablate  3/03    thyroid 10/07/2001  . Rastelli procedure  6/98 neg  . Split sleep study neg for sleep apnea  11/15/2003     Home Meds: Prior to Admission medications   Medication Sig Start Date End Date Taking? Authorizing Provider  albuterol (PROVENTIL HFA;VENTOLIN HFA) 108 (90 BASE) MCG/ACT inhaler Inhale 2 puffs into the lungs every 6 (six) hours as needed for wheezing. 04/02/13  Yes Biagio Borg, MD  ALPRAZolam Duanne Moron) 0.25 MG tablet Take 0.25 mg by mouth 2 (two) times daily as needed.     Yes Historical Provider, MD  benzonatate (TESSALON) 100 MG capsule Take 100 mg by mouth 2 (two) times daily as needed.  10/28/13  Yes Historical Provider, MD  budesonide (PULMICORT FLEXHALER) 180 MCG/ACT inhaler Inhale 2 puffs into the lungs 2 (two) times daily. 04/07/13  Yes Biagio Borg, MD  COLCRYS 0.6 MG  tablet TAKE 1 TABLET BY MOUTH EVERY DAY   Yes Biagio Borg, MD  ibuprofen (ADVIL,MOTRIN) 400 MG tablet Take 1 tablet (400 mg total) by mouth every 6 (six) hours as needed. 12/25/13  Yes Biagio Borg, MD  metFORMIN (GLUCOPHAGE) 500 MG tablet TAKE 1 TABLET BY MOUTH EVERY DAY 12/04/13  Yes Biagio Borg, MD  metoprolol succinate (TOPROL XL) 25 MG 24 hr tablet Take 1 tablet (25 mg total) by mouth daily. 01/01/14  Yes Sueanne Margarita, MD  montelukast (SINGULAIR) 10 MG tablet Take 10 mg  by mouth daily.     Yes Historical Provider, MD  Olmesartan-Amlodipine-HCTZ (TRIBENZOR) 40-10-25 MG TABS Take 1 tablet by mouth daily. 12/04/13  Yes Biagio Borg, MD  omeprazole (PRILOSEC) 20 MG capsule Take 1 capsule (20 mg total) by mouth 2 (two) times daily. 09/10/12  Yes Biagio Borg, MD  potassium chloride (KLOR-CON 10) 10 MEQ tablet Take 1 tablet (10 mEq total) by mouth 2 (two) times daily. 08/18/12  Yes Biagio Borg, MD  simvastatin (ZOCOR) 40 MG tablet Take 1 tablet (40 mg total) by mouth daily. 12/04/13  Yes Biagio Borg, MD  zolpidem (AMBIEN) 10 MG tablet Take 1 tablet (10 mg total) by mouth at bedtime as needed. 07/18/11  Yes Biagio Borg, MD      Allergies:  Allergies  Allergen Reactions  . Ace Inhibitors     REACTION: Patient denies allery  . Codeine     REACTION: rash nausea  . Hydrocodone     Dizzy and nausea  . Tramadol     n/v    History   Social History  . Marital Status: Married    Spouse Name: N/A    Number of Children: 2  . Years of Education: N/A   Occupational History  . CNA    Social History Main Topics  . Smoking status: Never Smoker   . Smokeless tobacco: Not on file  . Alcohol Use: No  . Drug Use: No  . Sexual Activity: Not on file   Other Topics Concern  . Not on file   Social History Narrative  . No narrative on file     Family History  Problem Relation Age of Onset  . Diabetes Mother   . Heart disease Father      ROS:  Please see the history of present illness.     All other systems reviewed and negative.    Physical Exam: Blood pressure 152/79, pulse 91, height 5\' 3"  (1.6 m), weight 170 lb (77.111 kg). General: Well developed, well nourished female in no acute distress. Head: Normocephalic, atraumatic, sclera non-icteric, no xanthomas, nares are without discharge. EENT: normal Lymph Nodes:  none Back: without scoliosis/kyphosis , no CVA tendersness Neck: Negative for carotid bruits. JVD not elevated. Lungs: Clear bilaterally  to auscultation without wheezes, rales, or rhonchi. Breathing is unlabored. Heart: RRR with diminished S1 S2. 2/6  murmur ,+S4 , or gallops appreciated. Abdomen: Soft, non-tender, non-distended with normoactive bowel sounds. No hepatomegaly. No rebound/guarding. No obvious abdominal masses. Msk:  Strength and tone appear normal for age. Extremities: No clubbing or cyanosis. No * edema.  Distal pedal pulses are 2+ and equal bilaterally. Skin: Warm and Dry Neuro: Alert and oriented X 3. CN v- XII intact Grossly normal sensory and motor function .she has exotropia Psych:  Responds to questions appropriately with a normal affect.      Labs: Cardiac Enzymes No results found for this  basename: CKTOTAL, CKMB, TROPONINI,  in the last 72 hours CBC Lab Results  Component Value Date   WBC 7.6 11/06/2013   HGB 12.5 11/06/2013   HCT 37.5 11/06/2013   MCV 81.9 11/06/2013   PLT 213.0 11/06/2013   PROTIME: No results found for this basename: LABPROT, INR,  in the last 72 hours Chemistry   Recent Labs Lab 01/01/14 1046  BUN 11  CREATININE 0.90   Lipids Lab Results  Component Value Date   CHOL 130 11/06/2013   HDL 43.80 11/06/2013   LDLCALC 74 11/06/2013   TRIG 60.0 11/06/2013   BNP No results found for this basename: probnp   Miscellaneous No results found for this basename: DDIMER    Radiology/Studies:  No results found.  EKG: *Sinus rhythm at 94 Intervals 15/12/37 Left axis deviation    poor R-wave progression  Assessment and Plan:   Nonsustained ventricular tachycardia-polymorphic  syncope  PVCs  The patient has nonsustained polymorphic ventricular tachycardia triggered by the same PVCs that she is having.   Excluding significant structural heart disease is important and to that end we'll undertake a signal average ECG cardiac MRI were also we'll pursue catheterization notwithstanding her negative Myoview.  If the aforementioned tests are negative, suppression of her  ectopic beats perhaps with flecainide might suffice to prevent the emergence of polymorphic VT alternatively we could consider catheter ablation.     Virl Axe

## 2014-01-07 NOTE — Interval H&P Note (Signed)
History and Physical Interval Note:  01/07/2014 1:29 PM  Brooke [name]  has presented today for surgery, with the diagnosis of tachicardia  The various methods of treatment have been discussed with the patient and family. After consideration of risks, benefits and other options for treatment, the patient has consented to  Procedure(s): LEFT HEART CATHETERIZATION WITH CORONARY ANGIOGRAM (N/A) as a surgical intervention .  The patient's history has been reviewed, patient examined, no change in status, stable for surgery.  I have reviewed the patient's chart and labs.  Questions were answered to the patient's satisfaction.   Cath Lab Visit (complete for each Cath Lab visit)  Clinical Evaluation Leading to the Procedure:   ACS: no  Non-ACS:    Anginal Classification: No Symptoms  Anti-ischemic medical therapy: Minimal Therapy (1 class of medications)  Non-Invasive Test Results: Low-risk stress test findings: cardiac mortality <1%/year  Prior CABG: No previous CABG        Brooke [name] [facility] 01/07/2014 1:29 PM

## 2014-01-07 NOTE — CV Procedure (Signed)
    Cardiac Catheterization Procedure Note  Name: Brooke [name] MRN: [id number] DOB: 1951/06/10  Procedure: Left Heart Cath, Selective Coronary Angiography, LV angiography  Indication: 63 yo BF with polymorphic VT.   Procedural Details: The right wrist was prepped, draped, and anesthetized with 1% lidocaine. Using the modified Seldinger technique, a 6 French slender sheath was introduced into the right radial artery. 3 mg of verapamil was administered through the sheath, weight-based unfractionated heparin was administered intravenously. Standard Judkins catheters were used for selective coronary angiography and left ventriculography. Catheter exchanges were performed over an exchange length guidewire. There were no immediate procedural complications. A TR band was used for radial hemostasis at the completion of the procedure.  The patient was transferred to the post catheterization recovery area for further monitoring.  Procedural Findings: Hemodynamics: AO 122/60 mean 87 mm Hg LV 129/3 mm Hg  Coronary angiography: Coronary dominance: left  Left mainstem: Normal.  Left anterior descending (LAD): Normal  Left circumflex (LCx): Normal.  Right coronary artery (RCA): Small nondominant. Normal.   Left ventriculography: Left ventricular systolic function is normal, LVEF is estimated at 55-65%, there is no significant mitral regurgitation   Final Conclusions:  1. Normal coronary anatomy 2. Normal LV function.  Recommendations: continue medical Rx.   Peter Martinique, [place]  01/07/2014, 1:58 PM

## 2014-01-09 LAB — HM DIABETES EYE EXAM

## 2014-01-11 ENCOUNTER — Ambulatory Visit: Payer: BC Managed Care – PPO | Admitting: Internal Medicine

## 2014-01-12 ENCOUNTER — Ambulatory Visit (HOSPITAL_COMMUNITY)
Admission: RE | Admit: 2014-01-12 | Discharge: 2014-01-12 | Disposition: A | Discharge status: Home / Self Care | Payer: BC Managed Care – PPO | Source: Ambulatory Visit | Attending: Neurology | Admitting: Neurology

## 2014-01-12 ENCOUNTER — Other Ambulatory Visit: Payer: Self-pay | Admitting: Neurology

## 2014-01-12 DIAGNOSIS — R42 Dizziness and giddiness: Secondary | ICD-10-CM | POA: Insufficient documentation

## 2014-01-12 DIAGNOSIS — R9389 Abnormal findings on diagnostic imaging of other specified body structures: Secondary | ICD-10-CM

## 2014-01-12 MED ORDER — GADOBENATE DIMEGLUMINE 529 MG/ML IV SOLN
20.0000 mL | Freq: Once | INTRAVENOUS | Status: AC | PRN
  Administered 2014-01-12: 16 mL via INTRAVENOUS

## 2014-01-13 ENCOUNTER — Encounter: Payer: Self-pay | Admitting: Neurology

## 2014-01-18 ENCOUNTER — Ambulatory Visit: Payer: BC Managed Care – PPO | Admitting: Cardiology

## 2014-01-29 ENCOUNTER — Ambulatory Visit: Payer: BC Managed Care – PPO | Admitting: Neurology

## 2014-02-01 ENCOUNTER — Ambulatory Visit (HOSPITAL_COMMUNITY)
Admission: RE | Admit: 2014-02-01 | Discharge: 2014-02-01 | Disposition: A | Discharge status: Home / Self Care | Payer: BC Managed Care – PPO | Source: Ambulatory Visit | Attending: Cardiology | Admitting: Cardiology

## 2014-02-01 DIAGNOSIS — I4729 Other ventricular tachycardia: Secondary | ICD-10-CM | POA: Insufficient documentation

## 2014-02-01 DIAGNOSIS — I472 Ventricular tachycardia, unspecified: Secondary | ICD-10-CM | POA: Insufficient documentation

## 2014-02-01 MED ORDER — GADOBENATE DIMEGLUMINE 529 MG/ML IV SOLN
25.0000 mL | Freq: Once | INTRAVENOUS | Status: AC | PRN
  Administered 2014-02-01: 25 mL via INTRAVENOUS

## 2014-02-08 ENCOUNTER — Ambulatory Visit: Payer: BC Managed Care – PPO | Admitting: Neurology

## 2014-02-16 ENCOUNTER — Ambulatory Visit (INDEPENDENT_AMBULATORY_CARE_PROVIDER_SITE_OTHER): Payer: BC Managed Care – PPO | Admitting: Neurology

## 2014-02-16 ENCOUNTER — Encounter: Payer: Self-pay | Admitting: Neurology

## 2014-02-16 VITALS — BP 130/68 | HR 74 | Temp 98.4°F | Resp 18 | Ht 63.0 in | Wt 167.1 lb

## 2014-02-16 DIAGNOSIS — R9389 Abnormal findings on diagnostic imaging of other specified body structures: Secondary | ICD-10-CM

## 2014-02-16 NOTE — Progress Notes (Signed)
NEUROLOGY FOLLOW UP OFFICE NOTE  Brooke [name] [id number]  HISTORY OF PRESENT ILLNESS: Brooke [name] is a 63 year old right-handed woman with history of nonsustained ventricular tachycardia-polymorphic syncope, hypertension, hyperlipidemia, type II diabetes mellitus, goiter, GERD, anemia and fatigue who follows up for abnormal MRI.  Records and images personally reviewed.  She is accompanied by her husband.  On 09/17/13, she had lost consciousness while at work.  She was unconscious for 5 minutes and woke up on the floor.  She had to crawl for help.  Following this, she began having recurrent headache and chest pain radiating into the right arm.  She was evaluated by cardiology for syncope.  She continues to have recurrent episodes of dizziness.  Initially they were associated with nausea, vomiting and diarrhea, but not any longer.  They last 10 minutes and occur daily.  They occur spontaneously and are not positional.  If she is standing during a spell, she becomes off balance and will veer towards the right.  She denies difficulty using her right hand.  She denies focal weakness, numbness.  She was subsequently found to have nonsustained polymorphic ventricular tachycardia.  MRI of the brain without contrast was performed on 11/20/13, revealing 1.3 cm region of increased FLAIR and T2 signal with mild swelling at the junction of the right lateral pons and right middle cerebellar peduncle and without restricted diffusion.  Impression discussed possibility of MS. She also had few foci of signal abnormality in the cerebral hemispheres as well.  MRI of the brain with and without contrast performed on 01/12/14, again showing hyperintensity within the right anterior pons at the level of the right fifth cranial nerve root entry zone without abnormal enhancement.  There was also noted small cerebral spinal fluid collection within the sella, possibly focal area of herniation of the dura into the pituitary gland  versus primary pituitary cystic lesion.  MRA of the head revealed some intracranial atherosclerotic changes, including mild to moderate narrowing of the right AICA, not felt to be responsible for the right pontine signal abnormality.  She denies prior episodes of neurologic disturbance. She denies prior episodes of transient vision loss. There is no family history of MS. Her son does have a diagnosis of neurofibromatosis.  She is doing well.  No changes.  She reports swelling in the legs, which happens from time to time.    PAST MEDICAL HISTORY: Past Medical History  Diagnosis Date  . ALOPECIA 12/30/2007  . ANEMIA-NOS 02/09/2008  . CHEST PAIN 02/28/2009  . COLD SORE 10/01/2007  . COLONIC POLYPS, HX OF 02/26/2007  . DIABETES MELLITUS, TYPE II 11/29/2008  . ECZEMA, ATOPIC DERMATITIS 09/19/2006  . FATIGUE 09/05/2009  . GERD 02/09/2008  . GOITER NOS 09/19/2006  . GOUT 08/09/2008  . HYPERLIPIDEMIA 10/01/2007  . HYPERTENSION 02/26/2007  . INSOMNIA NOS 09/19/2006  . OTITIS EXTERNA, RIGHT 02/28/2009  . Pain in joint, lower leg 10/01/2007  . SINUSITIS- ACUTE-NOS 05/20/2009  . SKIN LESION 08/09/2008  . Unspecified asthma(493.90) 09/19/2006  . Urinary frequency 11/29/2008  . Wheezing 12/30/2009  . Syncope 11/06/2013  . Ventricular tachycardia, polymorphic 01/04/2014    MEDICATIONS: Current Outpatient Prescriptions on File Prior to Visit  Medication Sig Dispense Refill  . albuterol (PROVENTIL HFA;VENTOLIN HFA) 108 (90 BASE) MCG/ACT inhaler Inhale 2 puffs into the lungs every 6 (six) hours as needed for wheezing.  1 Inhaler  11  . benzonatate (TESSALON) 100 MG capsule Take 100 mg by mouth 2 (two) times daily as needed  for cough.      . budesonide (PULMICORT FLEXHALER) 180 MCG/ACT inhaler Inhale 2 puffs into the lungs 2 (two) times daily.  1 Inhaler  11  . colchicine 0.6 MG tablet Take 0.6 mg by mouth daily.      Marland Kitchen ibuprofen (ADVIL,MOTRIN) 400 MG tablet Take 400 mg by mouth every 6 (six) hours as needed for mild pain.       . metoprolol succinate (TOPROL XL) 25 MG 24 hr tablet Take 1 tablet (25 mg total) by mouth daily.  30 tablet  5  . montelukast (SINGULAIR) 10 MG tablet Take 10 mg by mouth daily.        . Olmesartan-Amlodipine-HCTZ (TRIBENZOR) 40-10-25 MG TABS Take 1 tablet by mouth daily.  90 tablet  3  . omeprazole (PRILOSEC) 20 MG capsule Take 1 capsule (20 mg total) by mouth 2 (two) times daily.  180 capsule  3  . potassium chloride (KLOR-CON 10) 10 MEQ tablet Take 1 tablet (10 mEq total) by mouth 2 (two) times daily.  90 tablet  3  . simvastatin (ZOCOR) 40 MG tablet Take 1 tablet (40 mg total) by mouth daily.  90 tablet  3   No current facility-administered medications on file prior to visit.    ALLERGIES: Allergies  Allergen Reactions  . Ace Inhibitors Other (See Comments)    REACTION: Patient denies allery  . Hydrocodone Nausea Only  . Tramadol Nausea And Vomiting  . Codeine Nausea Only and Rash    FAMILY HISTORY: Family History  Problem Relation Age of Onset  . Diabetes Mother   . Heart disease Father     SOCIAL HISTORY: History   Social History  . Marital Status: Married    Spouse Name: N/A    Number of Children: 2  . Years of Education: N/A   Occupational History  . CNA    Social History Main Topics  . Smoking status: Never Smoker   . Smokeless tobacco: Not on file  . Alcohol Use: No  . Drug Use: No  . Sexual Activity: Not on file   Other Topics Concern  . Not on file   Social History Narrative  . No narrative on file    REVIEW OF SYSTEMS: Constitutional: No fevers, chills, or sweats, no generalized fatigue, change in appetite Eyes: No visual changes, double vision, eye pain Ear, nose and throat: No hearing loss, ear pain, nasal congestion, sore throat Cardiovascular: No chest pain, palpitations Respiratory:  No shortness of breath at rest or with exertion, wheezes GastrointestinaI: No nausea, vomiting, diarrhea, abdominal pain, fecal  incontinence Genitourinary:  No dysuria, urinary retention or frequency Musculoskeletal:  No neck pain, back pain Integumentary: No rash, pruritus, skin lesions Neurological: as above Psychiatric: No depression, insomnia, anxiety Endocrine: No palpitations, fatigue, diaphoresis, mood swings, change in appetite, change in weight, increased thirst Hematologic/Lymphatic:  No anemia, purpura, petechiae. Allergic/Immunologic: no itchy/runny eyes, nasal congestion, recent allergic reactions, rashes Extremities:  Swelling in the legs  PHYSICAL EXAM: Filed Vitals:   02/16/14 1030  BP: 130/68  Pulse: 74  Temp: 98.4 F (36.9 C)  Resp: 18   General: No acute distress Head:  Normocephalic/atraumatic Neck: supple, no paraspinal tenderness, full range of motion Heart:  Regular rate and rhythm Lungs:  Clear to auscultation bilaterally Back: No paraspinal tenderness Neurological Exam: alert and oriented to person, place, and time. Attention span and concentration intact, recent and remote memory intact, fund of knowledge intact.  Speech fluent and not dysarthric, language  intact.  Left exophthalmos. Left eye hypertropic and abducted on primary gaze. Reduced adduction of the left eye on extraocular muscle testing (these findings are reportedly chronic), no nystagmus, no ptosis.  Otherwise CN II-XII intact. Fundi not visualized.  Bulk and tone normal, muscle strength 5/5 throughout.  Sensation to light touch, temperature and vibration intact.  Deep tendon reflexes 2+ throughout, toes downgoing.  Finger to nose and heel to shin testing intact.  Gait with a limp due to swelling in the legs, Romberg negative.  IMPRESSION: Abnormal MRI of the brain, characterized as right pons.  Etiology unknown but seems to be an incidental finding.  She has no prior history to suggest ongoing demyelinating disease.  There is a family history of neurofibromatosis, but she does not have any other tumors involving the brain or  spinal cord.  PLAN: Will monitor for now.  Repeat MRI of the brain with and without contrast in 3 months with follow up soon after.  Should contact Dr. Jenny Reichmann regarding leg pain and swelling.  15 minutes that with the patient, over 50% spent counseling and coordinating care.  Metta Clines, DO  CC: [name], MD

## 2014-02-16 NOTE — Patient Instructions (Signed)
I'm not really sure what the imaging shows, but it is an incidental finding and since you are okay, I would just monitor.  I would repeat the MRI in 3 months with follow up soon after that.

## 2014-02-25 ENCOUNTER — Ambulatory Visit: Payer: BC Managed Care – PPO | Admitting: Internal Medicine

## 2014-03-30 ENCOUNTER — Encounter: Payer: Self-pay | Admitting: Cardiology

## 2014-03-30 ENCOUNTER — Ambulatory Visit (INDEPENDENT_AMBULATORY_CARE_PROVIDER_SITE_OTHER): Payer: BC Managed Care – PPO | Admitting: Cardiology

## 2014-03-30 VITALS — BP 160/90 | HR 89 | Ht 63.0 in | Wt 162.6 lb

## 2014-03-30 DIAGNOSIS — I4729 Other ventricular tachycardia: Secondary | ICD-10-CM

## 2014-03-30 DIAGNOSIS — I472 Ventricular tachycardia: Secondary | ICD-10-CM

## 2014-03-30 DIAGNOSIS — E119 Type 2 diabetes mellitus without complications: Secondary | ICD-10-CM

## 2014-03-30 DIAGNOSIS — I10 Essential (primary) hypertension: Secondary | ICD-10-CM

## 2014-03-30 LAB — BASIC METABOLIC PANEL
BUN: 20 mg/dL (ref 6–23)
CO2: 28 meq/L (ref 19–32)
CREATININE: 1.1 mg/dL (ref 0.4–1.2)
Calcium: 9.8 mg/dL (ref 8.4–10.5)
Chloride: 102 mEq/L (ref 96–112)
GFR: 64.68 mL/min (ref >60.00)
Glucose, Bld: 87 mg/dL (ref 70–99)
Potassium: 2.9 mEq/L — ABNORMAL LOW (ref 3.5–5.1)
SODIUM: 141 meq/L (ref 135–145)

## 2014-03-30 MED ORDER — METOPROLOL SUCCINATE ER 50 MG PO TB24: 50.0000 mg | ORAL_TABLET | Freq: Every day | ORAL | Status: DC

## 2014-03-30 NOTE — Progress Notes (Signed)
[place], [place],   [address] Phone: [phone] Fax:  [phone]  Date:  03/30/2014   ID:  Brooke [name], DOB Jul 25, 1950, MRN [id number]  PCP:  [name], MD  Cardiologist:  Fransico Him, MD     History of Present Illness: Brooke [name] is a 63 y.o. female with a history of HTN, dyslipdemia and DM had a syncopal episode on February 25th. She had also complained of some CP several weeks prior to the syncope that she says occurred several days several times daily. She was very vague in her description of the pain or its occurrence.  She has chronic DOE but has asthma. She occsaionally has some LE edema. Of note she had a recent MRI that showed an area suspicious for multiple sclerosis. She wore a heart monitor which revealed polymorphic VT. She was started on Toprol and a cardiac MRI was normal with normal EF 73%.   Lexiscan myoview showed no ischemia.  She subsequently underwent left heart cath which showed normal coronary arteries and normal LVF.  She is being followed by Dr. Caryl Comes for her polymorphic VT that was felt to be triggered by PVC's.  His recommendations were if cath and other w/u normal then would proceed with catheter ablation of PVC's vs. supression with Flecainide.  Since I saw her last her CP has resolved.  She has chronic SOB secondary to asthma.  She denies any dizziness, palpitations or syncope.   Wt Readings from Last 3 Encounters:  03/30/14 162 lb 9.6 oz (73.755 kg)  02/16/14 167 lb 1.6 oz (75.796 kg)  01/07/14 170 lb (77.111 kg)     Past Medical History  Diagnosis Date  . ALOPECIA 12/30/2007  . ANEMIA-NOS 02/09/2008  . CHEST PAIN 02/28/2009  . COLD SORE 10/01/2007  . COLONIC POLYPS, HX OF 02/26/2007  . DIABETES MELLITUS, TYPE II 11/29/2008  . ECZEMA, ATOPIC DERMATITIS 09/19/2006  . FATIGUE 09/05/2009  . GERD 02/09/2008  . GOITER NOS 09/19/2006  . GOUT 08/09/2008  . HYPERLIPIDEMIA 10/01/2007  . HYPERTENSION 02/26/2007  . INSOMNIA NOS 09/19/2006  .  OTITIS EXTERNA, RIGHT 02/28/2009  . Pain in joint, lower leg 10/01/2007  . SINUSITIS- ACUTE-NOS 05/20/2009  . SKIN LESION 08/09/2008  . Unspecified asthma(493.90) 09/19/2006  . Urinary frequency 11/29/2008  . Wheezing 12/30/2009  . Syncope 11/06/2013  . Ventricular tachycardia, polymorphic 01/04/2014    Current Outpatient Prescriptions  Medication Sig Dispense Refill  . albuterol (PROVENTIL HFA;VENTOLIN HFA) 108 (90 BASE) MCG/ACT inhaler Inhale 2 puffs into the lungs every 6 (six) hours as needed for wheezing.  1 Inhaler  11  . benzonatate (TESSALON) 100 MG capsule Take 100 mg by mouth 2 (two) times daily as needed for cough.      . budesonide (PULMICORT FLEXHALER) 180 MCG/ACT inhaler Inhale 2 puffs into the lungs 2 (two) times daily.  1 Inhaler  11  . colchicine 0.6 MG tablet Take 0.6 mg by mouth daily.      Marland Kitchen ibuprofen (ADVIL,MOTRIN) 400 MG tablet Take 400 mg by mouth every 6 (six) hours as needed for mild pain.      . metoprolol succinate (TOPROL XL) 25 MG 24 hr tablet Take 1 tablet (25 mg total) by mouth daily.  30 tablet  5  . montelukast (SINGULAIR) 10 MG tablet Take 10 mg by mouth daily.        . Olmesartan-Amlodipine-HCTZ (TRIBENZOR) 40-10-25 MG TABS Take 1 tablet by mouth daily.  90 tablet  3  . omeprazole (PRILOSEC) 20 MG capsule Take 1 capsule (20 mg total) by mouth 2 (two) times daily.  180 capsule  3  . potassium chloride (KLOR-CON 10) 10 MEQ tablet Take 1 tablet (10 mEq total) by mouth 2 (two) times daily.  90 tablet  3  . simvastatin (ZOCOR) 40 MG tablet Take 1 tablet (40 mg total) by mouth daily.  90 tablet  3   No current facility-administered medications for this visit.    Allergies:    Allergies  Allergen Reactions  . Ace Inhibitors Other (See Comments)    REACTION: Patient denies allery  . Hydrocodone Nausea Only  . Tramadol Nausea And Vomiting  . Codeine Nausea Only and Rash    Social History:  The patient  reports that she has never smoked. She does not have any  smokeless tobacco history on file. She reports that she does not drink alcohol or use illicit drugs.   Family History:  The patient's family history includes Diabetes in her mother; Heart disease in her father.   ROS:  Please see the history of present illness.      All other systems reviewed and negative.   PHYSICAL EXAM: VS:  BP 160/90  Pulse 89  Ht 5\' 3"  (1.6 m)  Wt 162 lb 9.6 oz (73.755 kg)  BMI 28.81 kg/m2 Well nourished, well developed, in no acute distress HEENT: normal Neck: no JVD Cardiac:  normal S1, S2; RRR; no murmur Lungs:  clear to auscultation bilaterally, no wheezing, rhonchi or rales Abd: soft, nontender, no hepatomegaly Ext: no edema Skin: warm and dry Neuro:  CNs 2-12 intact, no focal abnormalities noted       ASSESSMENT AND PLAN:  1. Polymorphic VT secondary to PVC's with normal cardiac MRI/2D echo and cardiac cath.   - continue BB - will have patient followup with Dr. Caryl Comes to determine further treatment of VT (ablation of PVC's vs. Antiarrhythmic therapy) 2. HTN - elevated BP on exam today - increase Toprol to 50mg  daily - continue Tribenzor - check BMET today - I have asked her to check her BP daily for a week and call with results 3. DM - followup with PCP for routine care  Followup with me in 6 months  Signed, Fransico Him, MD 03/30/2014 3:27 PM

## 2014-03-30 NOTE — Patient Instructions (Addendum)
Your physician has recommended you make the following change in your medication: 1. Increase Toprol to 50 MG 1 tablet daily  Your physician recommends that you go to the lab today for a BMET  Your physician has requested that you regularly monitor and record your blood pressure readings at home. Please use the same machine at the same time of day to check your readings and record them for one week and call us with the results.   Your physician recommends that you schedule a follow-up appointment ASAP with Dr Caryl Comes.  Your physician wants you to follow-up in: 6 months with Dr Mallie Snooks will receive a reminder letter in the mail two months in advance. If you don't receive a letter, please call our office to schedule the follow-up appointment.

## 2014-03-31 ENCOUNTER — Other Ambulatory Visit: Payer: Self-pay | Admitting: General Surgery

## 2014-03-31 DIAGNOSIS — Z79899 Other long term (current) drug therapy: Secondary | ICD-10-CM

## 2014-03-31 MED ORDER — POTASSIUM CHLORIDE CRYS ER 20 MEQ PO TBCR: EXTENDED_RELEASE_TABLET | ORAL | Status: DC

## 2014-04-07 ENCOUNTER — Other Ambulatory Visit (INDEPENDENT_AMBULATORY_CARE_PROVIDER_SITE_OTHER): Payer: BC Managed Care – PPO

## 2014-04-07 ENCOUNTER — Telehealth: Payer: Self-pay | Admitting: Internal Medicine

## 2014-04-07 DIAGNOSIS — Z79899 Other long term (current) drug therapy: Secondary | ICD-10-CM

## 2014-04-07 NOTE — Telephone Encounter (Signed)
Walk In pt Form " BP readings" gave to Sherri 9.16.15/km

## 2014-04-08 ENCOUNTER — Encounter: Payer: Self-pay | Admitting: General Surgery

## 2014-04-08 LAB — BASIC METABOLIC PANEL
BUN: 14 mg/dL (ref 6–23)
CHLORIDE: 106 meq/L (ref 96–112)
CO2: 26 mEq/L (ref 19–32)
CREATININE: 1 mg/dL (ref 0.4–1.2)
Calcium: 9.8 mg/dL (ref 8.4–10.5)
GFR: 70.56 mL/min (ref >60.00)
Glucose, Bld: 99 mg/dL (ref 70–99)
POTASSIUM: 4.7 meq/L (ref 3.5–5.1)
Sodium: 140 mEq/L (ref 135–145)

## 2014-04-15 ENCOUNTER — Ambulatory Visit: Payer: BC Managed Care – PPO

## 2014-04-20 ENCOUNTER — Telehealth: Payer: Self-pay

## 2014-04-20 MED ORDER — HYDROCHLOROTHIAZIDE 25 MG PO TABS: 25.0000 mg | ORAL_TABLET | Freq: Every day | ORAL | Status: DC

## 2014-04-20 MED ORDER — AMLODIPINE BESYLATE 10 MG PO TABS: 10.0000 mg | ORAL_TABLET | Freq: Every day | ORAL | Status: DC

## 2014-04-20 MED ORDER — OLMESARTAN MEDOXOMIL 40 MG PO TABS: 40.0000 mg | ORAL_TABLET | Freq: Every day | ORAL | Status: DC

## 2014-04-20 NOTE — Telephone Encounter (Signed)
Advise on PA for Tribenzor 40/10/25 or offer alternative.

## 2014-04-20 NOTE — Telephone Encounter (Signed)
Ok for change to 2 generic and 1 brand name (the tribenzor is a 3 drug medication)  The Benicar will need PA if not approved as she is allergic to ACE inhibitors

## 2014-04-21 NOTE — Telephone Encounter (Signed)
Patient informed of change and that will do PA for Benicar as soon as possible.

## 2014-04-23 ENCOUNTER — Ambulatory Visit (INDEPENDENT_AMBULATORY_CARE_PROVIDER_SITE_OTHER): Payer: BC Managed Care – PPO

## 2014-04-23 DIAGNOSIS — Z23 Encounter for immunization: Secondary | ICD-10-CM

## 2014-04-26 ENCOUNTER — Encounter: Payer: Self-pay | Admitting: Internal Medicine

## 2014-04-26 ENCOUNTER — Ambulatory Visit (INDEPENDENT_AMBULATORY_CARE_PROVIDER_SITE_OTHER): Payer: BC Managed Care – PPO | Admitting: Internal Medicine

## 2014-04-26 VITALS — BP 172/98 | HR 92 | Ht 63.0 in | Wt 166.2 lb

## 2014-04-26 DIAGNOSIS — I10 Essential (primary) hypertension: Secondary | ICD-10-CM

## 2014-04-26 DIAGNOSIS — G4719 Other hypersomnia: Secondary | ICD-10-CM

## 2014-04-26 DIAGNOSIS — I4729 Other ventricular tachycardia: Secondary | ICD-10-CM

## 2014-04-26 DIAGNOSIS — I472 Ventricular tachycardia: Secondary | ICD-10-CM

## 2014-04-26 NOTE — Patient Instructions (Signed)
Your physician recommends that you continue on your current medications as directed. Please refer to the Current Medication list given to you today.  No follow up needed at this time. Follow as needed with Dr. Caryl Comes.

## 2014-04-26 NOTE — Progress Notes (Signed)
Patient Care Team: Biagio Borg, MD as PCP - General   HPI  Brooke [name] is a 63 y.o. female Seen in followup for polymorphic ventricular tachycardia occurring what appeared to be structurally normal heart.  Cardiac MRI normal 7/15  She apparently had a signal average ECG which I cannot find it in epic. She denies problems with chest pain or shortness of breath.  Her blood pressure is elevated today; there are some issues regarding her medication and insurance which are ongoing.     Past Medical History  Diagnosis Date  . ALOPECIA 12/30/2007  . ANEMIA-NOS 02/09/2008  . CHEST PAIN 02/28/2009  . COLD SORE 10/01/2007  . COLONIC POLYPS, HX OF 02/26/2007  . DIABETES MELLITUS, TYPE II 11/29/2008  . ECZEMA, ATOPIC DERMATITIS 09/19/2006  . FATIGUE 09/05/2009  . GERD 02/09/2008  . GOITER NOS 09/19/2006  . GOUT 08/09/2008  . HYPERLIPIDEMIA 10/01/2007  . HYPERTENSION 02/26/2007  . INSOMNIA NOS 09/19/2006  . OTITIS EXTERNA, RIGHT 02/28/2009  . Pain in joint, lower leg 10/01/2007  . SINUSITIS- ACUTE-NOS 05/20/2009  . SKIN LESION 08/09/2008  . Unspecified asthma(493.90) 09/19/2006  . Urinary frequency 11/29/2008  . Wheezing 12/30/2009  . Syncope 11/06/2013  . Ventricular tachycardia, polymorphic 01/04/2014    Past Surgical History  Procedure Laterality Date  . Cholecystectomy  3.1.2004  . Abdominal hysterectomy  10/22/1999    BSO  . L ras re-stented -  03/08/2005  . L ras tx'd w/angioplasty  11/21/2003  . Left cataract surgury    . Radioactive iodine ablate  3/03    thyroid 10/07/2001  . Rastelli procedure  6/98 neg  . Split sleep study neg for sleep apnea  11/15/2003    Current Outpatient Prescriptions  Medication Sig Dispense Refill  . albuterol (PROVENTIL HFA;VENTOLIN HFA) 108 (90 BASE) MCG/ACT inhaler Inhale 2 puffs into the lungs every 6 (six) hours as needed for wheezing.  1 Inhaler  11  . amLODipine (NORVASC) 10 MG tablet Take 1 tablet (10 mg total) by mouth daily.  90 tablet  3  .  benzonatate (TESSALON) 100 MG capsule Take 100 mg by mouth 2 (two) times daily as needed for cough.      . budesonide (PULMICORT FLEXHALER) 180 MCG/ACT inhaler Inhale 2 puffs into the lungs 2 (two) times daily.  1 Inhaler  11  . colchicine 0.6 MG tablet Take 0.6 mg by mouth daily.      . hydrochlorothiazide (HYDRODIURIL) 25 MG tablet Take 1 tablet (25 mg total) by mouth daily.  90 tablet  3  . ibuprofen (ADVIL,MOTRIN) 400 MG tablet Take 400 mg by mouth every 6 (six) hours as needed for mild pain.      . metoprolol succinate (TOPROL XL) 50 MG 24 hr tablet Take 1 tablet (50 mg total) by mouth daily.  30 tablet  5  . montelukast (SINGULAIR) 10 MG tablet Take 10 mg by mouth daily.        Marland Kitchen omeprazole (PRILOSEC) 20 MG capsule Take 1 capsule (20 mg total) by mouth 2 (two) times daily.  180 capsule  3  . potassium chloride SA (K-DUR,KLOR-CON) 20 MEQ tablet Kdur to 24meq take 2 tablets this am and 2 tablets at 2pm today then starting tomorrow take 2 tablets daily  64 tablet  3  . simvastatin (ZOCOR) 40 MG tablet Take 1 tablet (40 mg total) by mouth daily.  90 tablet  3  . olmesartan (BENICAR) 40 MG tablet Take 1  tablet (40 mg total) by mouth daily.  90 tablet  3   No current facility-administered medications for this visit.    Allergies  Allergen Reactions  . Ace Inhibitors Other (See Comments)    REACTION: Patient denies allery  . Codeine Nausea Only and Rash  . Hydrocodone Nausea Only  . Tramadol Nausea And Vomiting    Review of Systems negative except from HPI and PMH  Physical Exam BP 172/98  Pulse 92  Ht 5\' 3"  (1.6 m)  Wt 166 lb 3.2 oz (75.388 kg)  BMI 29.45 kg/m2 Well developed and well nourished in no acute distress HENT normal E scleral and icterus clear Neck Supple JVP flat; carotids brisk and full Clear to ausculation  Regular rate and rhythm, no murmurs gallops or rub Soft with active bowel sounds No clubbing cyanosis Edema Alert and oriented, grossly normal motor and  sensory function Skin Warm and Dry  ECG demonstrates sinus rhythm and 92 Intervals 14/11/35 excellent axis leftward at -5   Assessment and  Plan  VT PM  No further episodes   MRI  Normal  SAECG-- pending  Hypertension-- poorly controlled   Daytime somnolence/sleep disorder breathing  No clinical ventricular tachycardia. Heart is structurally normal. We'll continue her on her beta blockers.   I wonder whether she would benefit from a sleep study was a daytime somnolence. Part of this may be related to the fact that she works third shift but she also snores apparently.  Blood pressure is poorly controlled but she is working on trying to get her Benicar issue sorted out. I've asked her to follow up with Dr. Jenny Reichmann about this.

## 2014-05-05 ENCOUNTER — Telehealth: Payer: Self-pay | Admitting: Cardiology

## 2014-05-05 NOTE — Telephone Encounter (Signed)
Pt is aware.  

## 2014-05-05 NOTE — Telephone Encounter (Signed)
Patient dropped off BP readings for a week which range from 111-144/71-43mmHg.  Please have patient continue current medical regimen

## 2014-05-13 ENCOUNTER — Encounter: Payer: Self-pay | Admitting: Internal Medicine

## 2014-05-13 LAB — HM DIABETES EYE EXAM

## 2014-05-18 ENCOUNTER — Encounter: Payer: Self-pay | Admitting: Internal Medicine

## 2014-05-18 ENCOUNTER — Ambulatory Visit (INDEPENDENT_AMBULATORY_CARE_PROVIDER_SITE_OTHER): Payer: BC Managed Care – PPO | Admitting: Internal Medicine

## 2014-05-18 VITALS — BP 172/98 | HR 98 | Temp 98.2°F | Ht 63.0 in | Wt 167.1 lb

## 2014-05-18 DIAGNOSIS — M1712 Unilateral primary osteoarthritis, left knee: Secondary | ICD-10-CM | POA: Insufficient documentation

## 2014-05-18 DIAGNOSIS — E785 Hyperlipidemia, unspecified: Secondary | ICD-10-CM

## 2014-05-18 DIAGNOSIS — M179 Osteoarthritis of knee, unspecified: Secondary | ICD-10-CM

## 2014-05-18 DIAGNOSIS — E119 Type 2 diabetes mellitus without complications: Secondary | ICD-10-CM

## 2014-05-18 DIAGNOSIS — I10 Essential (primary) hypertension: Secondary | ICD-10-CM

## 2014-05-18 MED ORDER — TRAMADOL HCL 50 MG PO TABS: 50.0000 mg | ORAL_TABLET | Freq: Three times a day (TID) | ORAL | Status: DC | PRN

## 2014-05-18 NOTE — Assessment & Plan Note (Signed)
Uncontrolled, for d/c benicar, ok for avapro 300 qd, f/u bp at home and next visit BP Readings from Last 3 Encounters:  05/18/14 172/98  04/26/14 172/98  03/30/14 160/90

## 2014-05-18 NOTE — Patient Instructions (Addendum)
Please take all new medication as prescribed - the pain medication for the knees  Please continue to use the cane for safety and stability  Please continue all other medications as before, and refills have been done if requested.  Please have the pharmacy call with any other refills you may need.  Please keep your appointments with your specialists as you may have planned  Please go to the LAB in the Basement (turn left off the elevator) for the tests to be done today  You will be contacted by phone if any changes need to be made immediately.  Otherwise, you will receive a letter about your results with an explanation, but please check with MyChart first.  Please return in 3 months, or sooner if needed

## 2014-05-18 NOTE — Assessment & Plan Note (Signed)
To cont cane, for tramadol prn,  to f/u any worsening symptoms or concerns

## 2014-05-18 NOTE — Assessment & Plan Note (Signed)
stable overall by history and exam, recent data reviewed with pt, and pt to continue medical treatment as before,  to f/u any worsening symptoms or concerns Lab Results  Component Value Date   HGBA1C 5.9 08/28/2013

## 2014-05-18 NOTE — Assessment & Plan Note (Signed)
stable overall by history and exam, recent data reviewed with pt, and pt to continue medical treatment as before,  to f/u any worsening symptoms or concerns Lab Results  Component Value Date   LDLCALC 74 11/06/2013

## 2014-05-18 NOTE — Progress Notes (Signed)
Pre visit review using our clinic review tool, if applicable. No additional management support is needed unless otherwise documented below in the visit note. 

## 2014-05-18 NOTE — Progress Notes (Signed)
Subjective:    Patient ID: Brooke [name], female    DOB: 1951/04/22, 62 y.o.   MRN: [id number]  HPI  Here to f/u, states ins no longer covers benicar so not taking in the past wks, has noticed some puffiness in the feet and legs ?, also with ongoing ? Worsening left knee chronic DJD pain, takingibuprofen prn, but not working well. Walks with cane in past 2 wks, has seen ortho but is holding off on surgury for now,  Does plan to retire as the pain is too much And cant use cane at work  Pt denies chest pain, increased sob or doe, wheezing, orthopnea, PND, palpitations, dizziness or syncope.  Pt denies new neurological symptoms such as new headache, or facial or extremity weakness or numbness.  More stress recently, has ? "spot" on MRI, suggestion of MS?  For f/u MRI later this year. Past Medical History  Diagnosis Date  . ALOPECIA 12/30/2007  . ANEMIA-NOS 02/09/2008  . CHEST PAIN 02/28/2009  . COLD SORE 10/01/2007  . COLONIC POLYPS, HX OF 02/26/2007  . DIABETES MELLITUS, TYPE II 11/29/2008  . ECZEMA, ATOPIC DERMATITIS 09/19/2006  . FATIGUE 09/05/2009  . GERD 02/09/2008  . GOITER NOS 09/19/2006  . GOUT 08/09/2008  . HYPERLIPIDEMIA 10/01/2007  . HYPERTENSION 02/26/2007  . INSOMNIA NOS 09/19/2006  . OTITIS EXTERNA, RIGHT 02/28/2009  . Pain in joint, lower leg 10/01/2007  . SINUSITIS- ACUTE-NOS 05/20/2009  . SKIN LESION 08/09/2008  . Unspecified asthma(493.90) 09/19/2006  . Urinary frequency 11/29/2008  . Wheezing 12/30/2009  . Syncope 11/06/2013  . Ventricular tachycardia, polymorphic 01/04/2014   Past Surgical History  Procedure Laterality Date  . Cholecystectomy  3.1.2004  . Abdominal hysterectomy  10/22/1999    BSO  . L ras re-stented -  03/08/2005  . L ras tx'd w/angioplasty  11/21/2003  . Left cataract surgury    . Radioactive iodine ablate  3/03    thyroid 10/07/2001  . Rastelli procedure  6/98 neg  . Split sleep study neg for sleep apnea  11/15/2003    reports that she has never smoked. She does not  have any smokeless tobacco history on file. She reports that she does not drink alcohol or use illicit drugs. family history includes Diabetes in her mother; Heart disease in her father. Allergies  Allergen Reactions  . Ace Inhibitors Other (See Comments)    REACTION: Patient denies allery  . Codeine Nausea Only and Rash  . Hydrocodone Nausea Only  . Tramadol Nausea And Vomiting   Current Outpatient Prescriptions on File Prior to Visit  Medication Sig Dispense Refill  . albuterol (PROVENTIL HFA;VENTOLIN HFA) 108 (90 BASE) MCG/ACT inhaler Inhale 2 puffs into the lungs every 6 (six) hours as needed for wheezing.  1 Inhaler  11  . amLODipine (NORVASC) 10 MG tablet Take 1 tablet (10 mg total) by mouth daily.  90 tablet  3  . benzonatate (TESSALON) 100 MG capsule Take 100 mg by mouth 2 (two) times daily as needed for cough.      . budesonide (PULMICORT FLEXHALER) 180 MCG/ACT inhaler Inhale 2 puffs into the lungs 2 (two) times daily.  1 Inhaler  11  . colchicine 0.6 MG tablet Take 0.6 mg by mouth daily.      . hydrochlorothiazide (HYDRODIURIL) 25 MG tablet Take 1 tablet (25 mg total) by mouth daily.  90 tablet  3  . ibuprofen (ADVIL,MOTRIN) 400 MG tablet Take 400 mg by mouth every 6 (six) hours as  needed for mild pain.      . metoprolol succinate (TOPROL XL) 50 MG 24 hr tablet Take 1 tablet (50 mg total) by mouth daily.  30 tablet  5  . montelukast (SINGULAIR) 10 MG tablet Take 10 mg by mouth daily.        Marland Kitchen omeprazole (PRILOSEC) 20 MG capsule Take 1 capsule (20 mg total) by mouth 2 (two) times daily.  180 capsule  3  . potassium chloride SA (K-DUR,KLOR-CON) 20 MEQ tablet Kdur to 37meq take 2 tablets this am and 2 tablets at 2pm today then starting tomorrow take 2 tablets daily  64 tablet  3  . simvastatin (ZOCOR) 40 MG tablet Take 1 tablet (40 mg total) by mouth daily.  90 tablet  3   No current facility-administered medications on file prior to visit.   Review of Systems  Constitutional:  Negative for unusual diaphoresis or other sweats  HENT: Negative for ringing in ear Eyes: Negative for double vision or worsening visual disturbance.  Respiratory: Negative for choking and stridor.   Gastrointestinal: Negative for vomiting or other signifcant bowel change Genitourinary: Negative for hematuria or decreased urine volume.  Musculoskeletal: Negative for other MSK pain or swelling Skin: Negative for color change and worsening wound.  Neurological: Negative for tremors and numbness other than noted  Psychiatric/Behavioral: Negative for decreased concentration or agitation other than above       Objective:   Physical Exam BP 172/98  Pulse 98  Temp(Src) 98.2 F (36.8 C) (Oral)  Ht 5\' 3"  (1.6 m)  Wt 167 lb 2 oz (75.807 kg)  BMI 29.61 kg/m2  SpO2 97% VS noted,  Constitutional: Pt appears well-developed, well-nourished.  HENT: Head: NCAT.  Right Ear: External ear normal.  Left Ear: External ear normal.  Eyes: . Pupils are equal, round, and reactive to light. Conjunctivae and EOM are normal Neck: Normal range of motion. Neck supple.  Cardiovascular: Normal rate and regular rhythm.   Pulmonary/Chest: Effort normal and breath sounds normal.  Abd:  Soft, NT, ND, + BS Neurological: Pt is alert. Not confused , motor grossly intact Skin: Skin is warm. No rash Psychiatric: Pt behavior is normal. No agitation.  Left knee crepitus 1-2+, small effusion No LE edema bilat     Assessment & Plan:

## 2014-05-19 ENCOUNTER — Ambulatory Visit (HOSPITAL_COMMUNITY): Admission: RE | Admit: 2014-05-19 | Payer: BC Managed Care – PPO | Source: Ambulatory Visit

## 2014-05-19 ENCOUNTER — Telehealth: Payer: Self-pay | Admitting: Internal Medicine

## 2014-05-19 NOTE — Telephone Encounter (Signed)
emmi mailed  °

## 2014-06-07 ENCOUNTER — Other Ambulatory Visit (INDEPENDENT_AMBULATORY_CARE_PROVIDER_SITE_OTHER): Payer: BC Managed Care – PPO

## 2014-06-07 DIAGNOSIS — E119 Type 2 diabetes mellitus without complications: Secondary | ICD-10-CM

## 2014-06-07 LAB — BASIC METABOLIC PANEL
BUN: 19 mg/dL (ref 6–23)
CHLORIDE: 104 meq/L (ref 96–112)
CO2: 26 meq/L (ref 19–32)
CREATININE: 0.9 mg/dL (ref 0.4–1.2)
Calcium: 9.8 mg/dL (ref 8.4–10.5)
GFR: 77.5 mL/min (ref >60.00)
Glucose, Bld: 97 mg/dL (ref 70–99)
Potassium: 3.8 mEq/L (ref 3.5–5.1)
Sodium: 142 mEq/L (ref 135–145)

## 2014-06-07 LAB — HEPATIC FUNCTION PANEL
ALT: 7 U/L (ref 0–35)
AST: 14 U/L (ref 0–37)
Albumin: 3.8 g/dL (ref 3.5–5.2)
Alkaline Phosphatase: 85 U/L (ref 39–117)
BILIRUBIN DIRECT: 0 mg/dL (ref 0.0–0.3)
TOTAL PROTEIN: 7.4 g/dL (ref 6.0–8.3)
Total Bilirubin: 0.3 mg/dL (ref 0.2–1.2)

## 2014-06-07 LAB — LIPID PANEL
CHOLESTEROL: 147 mg/dL (ref 0–200)
HDL: 36.4 mg/dL — AB (ref >39.00)
LDL Cholesterol: 91 mg/dL (ref 0–99)
NONHDL: 110.6
TRIGLYCERIDES: 98 mg/dL (ref 0.0–149.0)
Total CHOL/HDL Ratio: 4
VLDL: 19.6 mg/dL (ref 0.0–40.0)

## 2014-06-07 LAB — HEMOGLOBIN A1C: Hgb A1c MFr Bld: 5.7 % (ref 4.6–6.5)

## 2014-06-08 ENCOUNTER — Ambulatory Visit: Payer: BC Managed Care – PPO | Admitting: Internal Medicine

## 2014-06-08 ENCOUNTER — Encounter: Payer: Self-pay | Admitting: Internal Medicine

## 2014-07-01 ENCOUNTER — Encounter (HOSPITAL_COMMUNITY): Payer: Self-pay | Admitting: Cardiology

## 2014-07-05 ENCOUNTER — Ambulatory Visit (HOSPITAL_COMMUNITY): Payer: PRIVATE HEALTH INSURANCE

## 2014-07-21 ENCOUNTER — Encounter: Payer: Self-pay | Admitting: Cardiology

## 2014-08-18 ENCOUNTER — Encounter: Payer: Self-pay | Admitting: Internal Medicine

## 2014-08-18 ENCOUNTER — Ambulatory Visit (INDEPENDENT_AMBULATORY_CARE_PROVIDER_SITE_OTHER): Payer: BLUE CROSS/BLUE SHIELD | Admitting: Internal Medicine

## 2014-08-18 VITALS — BP 162/90 | HR 100 | Temp 98.2°F | Ht 63.0 in | Wt 169.1 lb

## 2014-08-18 DIAGNOSIS — I10 Essential (primary) hypertension: Secondary | ICD-10-CM

## 2014-08-18 DIAGNOSIS — Z0189 Encounter for other specified special examinations: Secondary | ICD-10-CM | POA: Diagnosis not present

## 2014-08-18 DIAGNOSIS — E785 Hyperlipidemia, unspecified: Secondary | ICD-10-CM

## 2014-08-18 DIAGNOSIS — E119 Type 2 diabetes mellitus without complications: Secondary | ICD-10-CM | POA: Diagnosis not present

## 2014-08-18 DIAGNOSIS — M25562 Pain in left knee: Secondary | ICD-10-CM

## 2014-08-18 DIAGNOSIS — M25552 Pain in left hip: Secondary | ICD-10-CM

## 2014-08-18 DIAGNOSIS — M545 Low back pain: Secondary | ICD-10-CM

## 2014-08-18 DIAGNOSIS — Z0000 Encounter for general adult medical examination without abnormal findings: Secondary | ICD-10-CM

## 2014-08-18 MED ORDER — COLCHICINE 0.6 MG PO TABS
0.6000 mg | ORAL_TABLET | Freq: Every day | ORAL | Status: DC
Start: 2014-08-18 — End: 2014-08-18

## 2014-08-18 MED ORDER — COLCHICINE 0.6 MG PO TABS: 0.6000 mg | ORAL_TABLET | Freq: Every day | ORAL | Status: DC

## 2014-08-18 MED ORDER — IBUPROFEN 400 MG PO TABS: 400.0000 mg | ORAL_TABLET | Freq: Four times a day (QID) | ORAL | Status: DC | PRN

## 2014-08-18 MED ORDER — SIMVASTATIN 40 MG PO TABS: 40.0000 mg | ORAL_TABLET | Freq: Every day | ORAL | Status: DC

## 2014-08-18 MED ORDER — ZOLPIDEM TARTRATE 5 MG PO TABS: 5.0000 mg | ORAL_TABLET | Freq: Every evening | ORAL | Status: DC | PRN

## 2014-08-18 MED ORDER — METFORMIN HCL ER 500 MG PO TB24: 500.0000 mg | ORAL_TABLET | Freq: Every day | ORAL | Status: DC

## 2014-08-18 MED ORDER — IBUPROFEN 600 MG PO TABS: 600.0000 mg | ORAL_TABLET | Freq: Four times a day (QID) | ORAL | Status: DC | PRN

## 2014-08-18 NOTE — Patient Instructions (Addendum)
Please take all new medication as prescribed - the ambien for sleep  OK to take the increased ibuprofen at 600 mg as needed  You will be contacted regarding the referral for: Dr Tamala Julian for the back, hip and knee  Please continue all other medications as before, and refills have been done if requested.  Please have the pharmacy call with any other refills you may need.  Please keep your appointments with your specialists as you may have planned  Please return in 6 months, or sooner if needed, with Lab testing done 3-5 days before

## 2014-08-18 NOTE — Progress Notes (Signed)
Subjective:    Patient ID: Brooke [name], female    DOB: 05/30/1951, 64 y.o.   MRN: [id number]  HPI  Here for routine f/u -  Asks for sleep med due to incresd stress recently, stress with caring for husband with ongoing abd wound dressing changes from recent surgury.  Feels like " I just want to run away." Denies worsening depressive symptoms, suicidal ideation, or panic., but has significant stress..  Pt denies polydipsia, polyuria, or low sugar symptoms such as weakness or confusion improved with po intake.  Pt states overall good compliance with meds, trying to follow lower cholesterol, diabetic diet, wt overall stable but little exercise however, but sugars 189-290 recently. Pt denies chest pain, increased sob or doe, wheezing, orthopnea, PND, increased LE swelling, palpitations, dizziness or syncope.  Pt denies new neurological symptoms such as new headache, or facial or extremity weakness or numbness. Has ongoing back, hip and knee pain, advill 200 mg not working as well as recent in the past. Past Medical History  Diagnosis Date  . ALOPECIA 12/30/2007  . ANEMIA-NOS 02/09/2008  . CHEST PAIN 02/28/2009  . COLD SORE 10/01/2007  . COLONIC POLYPS, HX OF 02/26/2007  . DIABETES MELLITUS, TYPE II 11/29/2008  . ECZEMA, ATOPIC DERMATITIS 09/19/2006  . FATIGUE 09/05/2009  . GERD 02/09/2008  . GOITER NOS 09/19/2006  . GOUT 08/09/2008  . HYPERLIPIDEMIA 10/01/2007  . HYPERTENSION 02/26/2007  . INSOMNIA NOS 09/19/2006  . OTITIS EXTERNA, RIGHT 02/28/2009  . Pain in joint, lower leg 10/01/2007  . SINUSITIS- ACUTE-NOS 05/20/2009  . SKIN LESION 08/09/2008  . Unspecified asthma(493.90) 09/19/2006  . Urinary frequency 11/29/2008  . Wheezing 12/30/2009  . Syncope 11/06/2013  . Ventricular tachycardia, polymorphic 01/04/2014  . Degenerative arthritis of left knee 05/18/2014   Past Surgical History  Procedure Laterality Date  . Cholecystectomy  3.1.2004  . Abdominal hysterectomy  10/22/1999    BSO  . L ras re-stented -   03/08/2005  . L ras tx'd w/angioplasty  11/21/2003  . Left cataract surgury    . Radioactive iodine ablate  3/03    thyroid 10/07/2001  . Rastelli procedure  6/98 neg  . Split sleep study neg for sleep apnea  11/15/2003  . Left heart catheterization with coronary angiogram N/A 01/07/2014    Procedure: LEFT HEART CATHETERIZATION WITH CORONARY ANGIOGRAM;  Surgeon: Peter M Martinique, MD;  Location: [facility] CATH LAB;  Service: Cardiovascular;  Laterality: N/A;    reports that she has never smoked. She does not have any smokeless tobacco history on file. She reports that she does not drink alcohol or use illicit drugs. family history includes Diabetes in her mother; Heart disease in her father. Allergies  Allergen Reactions  . Ace Inhibitors Other (See Comments)    REACTION: Patient denies allery  . Codeine Nausea Only and Rash  . Hydrocodone Nausea Only  . Tramadol Nausea And Vomiting   Current Outpatient Prescriptions on File Prior to Visit  Medication Sig Dispense Refill  . albuterol (PROVENTIL HFA;VENTOLIN HFA) 108 (90 BASE) MCG/ACT inhaler Inhale 2 puffs into the lungs every 6 (six) hours as needed for wheezing. 1 Inhaler 11  . amLODipine (NORVASC) 10 MG tablet Take 1 tablet (10 mg total) by mouth daily. 90 tablet 3  . benzonatate (TESSALON) 100 MG capsule Take 100 mg by mouth 2 (two) times daily as needed for cough.    . budesonide (PULMICORT FLEXHALER) 180 MCG/ACT inhaler Inhale 2 puffs into the lungs 2 (two) times daily.  1 Inhaler 11  . hydrochlorothiazide (HYDRODIURIL) 25 MG tablet Take 1 tablet (25 mg total) by mouth daily. 90 tablet 3  . metoprolol succinate (TOPROL XL) 50 MG 24 hr tablet Take 1 tablet (50 mg total) by mouth daily. 30 tablet 5  . montelukast (SINGULAIR) 10 MG tablet Take 10 mg by mouth daily.      Marland Kitchen omeprazole (PRILOSEC) 20 MG capsule Take 1 capsule (20 mg total) by mouth 2 (two) times daily. 180 capsule 3  . potassium chloride SA (K-DUR,KLOR-CON) 20 MEQ tablet Kdur to 33meq  take 2 tablets this am and 2 tablets at 2pm today then starting tomorrow take 2 tablets daily 64 tablet 3  . traMADol (ULTRAM) 50 MG tablet Take 1 tablet (50 mg total) by mouth every 8 (eight) hours as needed. 90 tablet 2   No current facility-administered medications on file prior to visit.    Review of Systems  Constitutional: Negative for unusual diaphoresis or other sweats  HENT: Negative for ringing in ear Eyes: Negative for double vision or worsening visual disturbance.  Respiratory: Negative for choking and stridor.   Gastrointestinal: Negative for vomiting or other signifcant bowel change Genitourinary: Negative for hematuria or decreased urine volume.  Musculoskeletal: Negative for other MSK pain or swelling Skin: Negative for color change and worsening wound.  Neurological: Negative for tremors and numbness other than noted  Psychiatric/Behavioral: Negative for decreased concentration or agitation other than above       Objective:   Physical Exam BP 162/90 mmHg  Pulse 100  Temp(Src) 98.2 F (36.8 C) (Oral)  Ht 5\' 3"  (1.6 m)  Wt 169 lb 2 oz (76.715 kg)  BMI 29.97 kg/m2  SpO2 98% VS noted,  Constitutional: Pt appears well-developed, well-nourished.  HENT: Head: NCAT.  Right Ear: External ear normal.  Left Ear: External ear normal.  Eyes: . Pupils are equal, round, and reactive to light. Conjunctivae and EOM are normal Neck: Normal range of motion. Neck supple.  Cardiovascular: Normal rate and regular rhythm.   Pulmonary/Chest: Effort normal and breath sounds without rales or wheezing.  Spine nontender, has tender bilat paravertebral tender without swelling or rash Neurological: Pt is alert. Not confused , motor intact 5/5 Skin: Skin is warm. No rash Psychiatric: Pt behavior is normal. No agitation. 1-2+ nervous    Assessment & Plan:

## 2014-08-18 NOTE — Progress Notes (Signed)
Pre visit review using our clinic review tool, if applicable. No additional management support is needed unless otherwise documented below in the visit note. 

## 2014-08-22 NOTE — Assessment & Plan Note (Signed)
stable overall by history and exam, recent data reviewed with pt, and pt to continue medical treatment as before,  to f/u any worsening symptoms or concerns Lab Results  Component Value Date   LDLCALC 91 06/07/2014

## 2014-08-22 NOTE — Assessment & Plan Note (Signed)
stable overall by history and exam, recent data reviewed with pt, and pt to continue medical treatment as before,  to f/u any worsening symptoms or concerns Lab Results  Component Value Date   HGBA1C 5.7 06/07/2014

## 2014-08-22 NOTE — Assessment & Plan Note (Signed)
Persistent elevated, o/w stable overall by history and exam, recent data reviewed with pt, and pt to continue medical treatment as before as pt declines any med change - too much stress per pt,  to f/u any worsening symptoms or concerns BP Readings from Last 3 Encounters:  08/18/14 162/90  05/18/14 172/98  04/26/14 172/98

## 2014-08-31 ENCOUNTER — Encounter: Payer: Self-pay | Admitting: Family Medicine

## 2014-08-31 ENCOUNTER — Ambulatory Visit (INDEPENDENT_AMBULATORY_CARE_PROVIDER_SITE_OTHER): Payer: BLUE CROSS/BLUE SHIELD | Admitting: Family Medicine

## 2014-08-31 ENCOUNTER — Other Ambulatory Visit (INDEPENDENT_AMBULATORY_CARE_PROVIDER_SITE_OTHER): Payer: BLUE CROSS/BLUE SHIELD

## 2014-08-31 VITALS — BP 150/92 | HR 92 | Ht 63.0 in | Wt 171.0 lb

## 2014-08-31 DIAGNOSIS — M7062 Trochanteric bursitis, left hip: Secondary | ICD-10-CM

## 2014-08-31 DIAGNOSIS — M25552 Pain in left hip: Secondary | ICD-10-CM | POA: Diagnosis not present

## 2014-08-31 DIAGNOSIS — M179 Osteoarthritis of knee, unspecified: Secondary | ICD-10-CM

## 2014-08-31 DIAGNOSIS — M1712 Unilateral primary osteoarthritis, left knee: Secondary | ICD-10-CM

## 2014-08-31 NOTE — Assessment & Plan Note (Signed)
Patient was given an injection. Discussed icing protocol and home exercises. Patient also was given a medial unloader brace to try to help with the valgus deformity. We discussed topical anti-inflammatories and over-the-counter medications that might be beneficial as well. We discussed the importance of strengthening. Patient did work with a Product/process development scientist today to learn home exercises in greater detail. Patient will come back and see me again in 3-4 weeks for further evaluation and treatment.

## 2014-08-31 NOTE — Progress Notes (Signed)
Brooke [name], [place] [address] Phone: [phone] Subjective:    I'm seeing this patient by the request  of:  Brooke Cower, MD   CC: Left hip pain  [id number] Brooke [name] is a 64 y.o. female coming in with complaint of left hip pain. Patient and she has had this pain for quite some time. Seems to be radiating down the lateral aspect of her leg. Does wake her up when she lays on that side at night. Makes it difficult when going from a seated to standing position. Patient states when she starts walking it seems to improve. Patient is walking with the aid of a cane now. Patient states that this is a severe pain. Rates the severity of 8 out of 10. Denies any weakness. Patient though has started changing her activities of daily living due to this discomfort.      Past medical history, social, surgical and family history all reviewed in electronic medical record.   Review of Systems: No headache, visual changes, nausea, vomiting, diarrhea, constipation, dizziness, abdominal pain, skin rash, fevers, chills, night sweats, weight loss, swollen lymph nodes, body aches, joint swelling, muscle aches, chest pain, shortness of breath, mood changes.   Objective Blood pressure 150/92, pulse 92, height 5\' 3"  (1.6 m), weight 171 lb (77.565 kg), SpO2 98 %.  General: No apparent distress alert and oriented x3 mood and affect normal, dressed appropriately.  HEENT: Pupils equal, extraocular movements intact  Respiratory: Patient's speak in full sentences and does not appear short of breath  Cardiovascular: No lower extremity edema, non tender, no erythema  Skin: Warm dry intact with no signs of infection or rash on extremities or on axial skeleton.  Abdomen: Soft nontender  Neuro: Cranial nerves II through XII are intact, neurovascularly intact in all extremities with 2+ DTRs and 2+ pulses.  Lymph: No lymphadenopathy of posterior or anterior cervical  chain or axillae bilaterally.  Gait normal with good balance and coordination.  MSK:  Non tender with full range of motion and good stability and symmetric strength and tone of shoulders, elbows, wrist,  knee and ankles bilaterally.   Left knee exam does show the patient does have severe valgus deformity of the knee.  MSK US performed of: Left This study was ordered, performed, and interpreted by Charlann Boxer D.O.  Hip: Trochanteric bursa with significant hypoechoic changes and swelling Acetabular labrum visualized and without tears, displacement, or effusion in joint. Femoral neck appears unremarkable without increased power doppler signal along Cortex.  IMPRESSION:  Greater trochanter bursitis   Procedure: Real-time Ultrasound Guided Injection of left greater trochanteric bursitis secondary to patient's body habitus Device: GE Logiq E  Ultrasound guided injection is preferred based studies that show increased duration, increased effect, greater accuracy, decreased procedural pain, increased response rate, and decreased cost with ultrasound guided versus blind injection.  Verbal informed consent obtained.  Time-out conducted.  Noted no overlying erythema, induration, or other signs of local infection.  Skin prepped in a sterile fashion.  Local anesthesia: Topical Ethyl chloride.  With sterile technique and under real time ultrasound guidance:  Greater trochanteric area was visualized and patient's bursa was noted. A 22-gauge 3 inch needle was inserted and 4 cc of 0.5% Marcaine and 1 cc of Kenalog 40 mg/dL was injected. Pictures taken Completed without difficulty  Pain immediately resolved suggesting accurate placement of the medication.  Advised to call if fevers/chills, erythema, induration, drainage, or persistent bleeding.  Images permanently stored and available for review in the ultrasound unit.  Impression: Technically successful ultrasound guided injection.   Procedure  note 72257; 15 minutes spent for Therapeutic exercises as stated in above notes.  This included exercises focusing on stretching, strengthening, with significant focus on eccentric aspects.   Proper technique shown and discussed handout in great detail with ATC.  All questions were discussed and answered.     Impression and Recommendations:     This case required medical decision making of moderate complexity.

## 2014-08-31 NOTE — Assessment & Plan Note (Signed)
She does have severe degenerative changes of the left knee. Patient does have valgus deformity try bracing. I do think that this is likely the underlying problem. Patient's x-rays 1 year ago showed mild osteophytic changes but I think patient will have severe changes at this time. We will discuss further evaluation. Patient may need custom orthotics as well to try to align patient's knee more appropriately. We will discuss at follow-up.

## 2014-08-31 NOTE — Patient Instructions (Signed)
Very nice to meet you Ice 20 minutes 2 times daily. Usually after activity and before bed. Try flonase and use daily.  We will try a knee brace Exercises 3 times a week. Alternate the hip and the knee.  Pennsaid twice daily as needed  See me again in 3-4 weeks.

## 2014-09-29 ENCOUNTER — Encounter: Payer: Self-pay | Admitting: Family Medicine

## 2014-09-29 ENCOUNTER — Ambulatory Visit (INDEPENDENT_AMBULATORY_CARE_PROVIDER_SITE_OTHER): Payer: BLUE CROSS/BLUE SHIELD | Admitting: Family Medicine

## 2014-09-29 VITALS — BP 144/82 | HR 88 | Ht 63.0 in | Wt 173.0 lb

## 2014-09-29 DIAGNOSIS — M1712 Unilateral primary osteoarthritis, left knee: Secondary | ICD-10-CM

## 2014-09-29 DIAGNOSIS — M179 Osteoarthritis of knee, unspecified: Secondary | ICD-10-CM

## 2014-09-29 DIAGNOSIS — M216X1 Other acquired deformities of right foot: Secondary | ICD-10-CM

## 2014-09-29 DIAGNOSIS — M216X2 Other acquired deformities of left foot: Secondary | ICD-10-CM

## 2014-09-29 DIAGNOSIS — M7062 Trochanteric bursitis, left hip: Secondary | ICD-10-CM

## 2014-09-29 NOTE — Progress Notes (Signed)
Pre visit review using our clinic review tool, if applicable. No additional management support is needed unless otherwise documented below in the visit note. 

## 2014-09-29 NOTE — Progress Notes (Signed)
  [name] [address] [place], [place] [address] Phone: [phone] Subjective:    I'm seeing this patient by the request  of:  [name], MD   CC: Left hip pain follow-up  [id number] Brooke [name] is a 64 y.o. female coming in with complaint of left hip pain. Patient found to have more of a greater trochanteric as well as some underlying severe osteophytic changes. Patient also has severe valgus deformity of the knees bilaterally. Patient was put in a lateral unloader brace. Patient faces she is feeling 80% better after the injection. Patient denies any numbness or detailing. Denies any radiation of pain down the leg. Patient denies any groin pain. Very happy with the results. Still ambulates with the aid of a cane.     Past medical history, social, surgical and family history all reviewed in electronic medical record.   Review of Systems: No headache, visual changes, nausea, vomiting, diarrhea, constipation, dizziness, abdominal pain, skin rash, fevers, chills, night sweats, weight loss, swollen lymph nodes, body aches, joint swelling, muscle aches, chest pain, shortness of breath, mood changes.   Objective Blood pressure 144/82, pulse 88, height 5\' 3"  (1.6 m), weight 173 lb (78.472 kg), SpO2 96 %.  General: No apparent distress alert and oriented x3 mood and affect normal, dressed appropriately.  HEENT: Pupils equal, extraocular movements intact  Respiratory: Patient's speak in full sentences and does not appear short of breath  Cardiovascular: No lower extremity edema, non tender, no erythema  Skin: Warm dry intact with no signs of infection or rash on extremities or on axial skeleton.  Abdomen: Soft nontender  Neuro: Cranial nerves II through XII are intact, neurovascularly intact in all extremities with 2+ DTRs and 2+ pulses.  Lymph: No lymphadenopathy of posterior or anterior cervical chain or axillae bilaterally.  Gait normal with good  balance and coordination.  MSK:  Non tender with full range of motion and good stability and symmetric strength and tone of shoulders, elbows, wrist,  knee and ankles bilaterally.   Hip: left ROM IR: 25 Deg, ER: 45 Deg, Flexion: 120 Deg, Extension: 100 Deg, Abduction: 45 Deg, Adduction: 45 Deg Strength IR: 5/5, ER: 5/5, Flexion: 5/5, Extension: 5/5, Abduction: 5/5, Adduction: 5/5 Pelvic alignment unremarkable to inspection and palpation. Standing hip rotation and gait without trendelenburg sign / unsteadiness. Greater trochanter without tenderness to palpation. No tenderness over piriformis and greater trochanter. Mild pain with lateral palpation and FABER negative FADIR No SI joint tenderness and normal minimal SI movement.  Left knee exam does show the patient does have severe valgus deformity of the knee.  Foot exam also shows the patient does have overpronation of hindfoot bilaterally.       Impression and Recommendations:     This case required medical decision making of moderate complexity.

## 2014-09-29 NOTE — Assessment & Plan Note (Signed)
She'll continue to wear the brace on a regular basis. 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 Patient has any worsening pain she'll come back for further evaluation.

## 2014-09-29 NOTE — Assessment & Plan Note (Signed)
Patient underwent very well to the injection as well as conservative therapy. Patient is 80% better and encourage her to continue home exercises on a regular basis. We did make some augmentation significant to patient having 1 difficulty with an exercise. Patient and will come back and see me on an as-needed basis.

## 2014-09-29 NOTE — Patient Instructions (Signed)
Good to see you Ice is your friend Continue exercises at least 3 times a week but not the ball on the wall.  Continue the vitamins See me when you need me.

## 2014-10-05 NOTE — Progress Notes (Signed)
Cardiology Office Note   Date:  10/06/2014   ID:  Brooke [name], DOB 04-03-1951, MRN [id number]  PCP:  [name], MD  Cardiologist:   Sueanne Margarita, MD   Chief Complaint  Patient presents with  . Irregular Heart Beat  . Hypertension      History of Present Illness: Brooke [name] is a 64 y.o. female with a history of HTN, dyslipdemia, DM and syncope. She has chronic DOE but has asthma. She occsaionally has some LE edema.  She has a history of polymorphic VT and was started on Toprol and a cardiac MRI was normal with normal EF 73%. Lexiscan myoview showed no ischemia. She subsequently underwent left heart cath which showed normal coronary arteries and normal LVF. She is being followed by Dr. Caryl Comes for her polymorphic VT that was felt to be triggered by PVC's. His recommendations were if cath and other w/u normal then would proceed with catheter ablation of PVC's vs. supression with Flecainide.  She has chronic SOB secondary to asthma. She has had a cough and increased SOB for about 2 weeks.  She denies any productivity to her cough and no fever or chills.  She has has some sharp pain in her chest that is worse with deep breathing.  This occurred for 2 weeks and then resolved.  She has noticed some wheezing at night.  She saw her PCP but no changes were made to her meds.  Now she feels fine with no further CP and SOB has improved.  She has some LE edema last week but that has resolved.   Past Medical History  Diagnosis Date  . ALOPECIA 12/30/2007  . ANEMIA-NOS 02/09/2008  . CHEST PAIN 02/28/2009  . COLD SORE 10/01/2007  . COLONIC POLYPS, HX OF 02/26/2007  . DIABETES MELLITUS, TYPE II 11/29/2008  . ECZEMA, ATOPIC DERMATITIS 09/19/2006  . FATIGUE 09/05/2009  . GERD 02/09/2008  . GOITER NOS 09/19/2006  . GOUT 08/09/2008  . HYPERLIPIDEMIA 10/01/2007  . HYPERTENSION 02/26/2007  . INSOMNIA NOS 09/19/2006  . OTITIS EXTERNA, RIGHT 02/28/2009  . Pain in joint, lower leg 10/01/2007  . SINUSITIS-  ACUTE-NOS 05/20/2009  . SKIN LESION 08/09/2008  . Unspecified asthma(493.90) 09/19/2006  . Urinary frequency 11/29/2008  . Wheezing 12/30/2009  . Syncope 11/06/2013  . Ventricular tachycardia, polymorphic 01/04/2014  . Degenerative arthritis of left knee 05/18/2014    Past Surgical History  Procedure Laterality Date  . Cholecystectomy  3.1.2004  . Abdominal hysterectomy  10/22/1999    BSO  . L ras re-stented -  03/08/2005  . L ras tx'd w/angioplasty  11/21/2003  . Left cataract surgury    . Radioactive iodine ablate  3/03    thyroid 10/07/2001  . Rastelli procedure  6/98 neg  . Split sleep study neg for sleep apnea  11/15/2003  . Left heart catheterization with coronary angiogram N/A 01/07/2014    Procedure: LEFT HEART CATHETERIZATION WITH CORONARY ANGIOGRAM;  Surgeon: Peter M Martinique, MD;  Location: [facility] CATH LAB;  Service: Cardiovascular;  Laterality: N/A;     Current Outpatient Prescriptions  Medication Sig Dispense Refill  . albuterol (PROVENTIL HFA;VENTOLIN HFA) 108 (90 BASE) MCG/ACT inhaler Inhale 2 puffs into the lungs every 6 (six) hours as needed for wheezing. 1 Inhaler 11  . amLODipine (NORVASC) 10 MG tablet Take 1 tablet (10 mg total) by mouth daily. 90 tablet 3  . benzonatate (TESSALON) 100 MG capsule Take 100 mg by mouth 2 (two) times daily as needed  for cough.    . budesonide (PULMICORT FLEXHALER) 180 MCG/ACT inhaler Inhale 2 puffs into the lungs 2 (two) times daily. 1 Inhaler 11  . colchicine 0.6 MG tablet Take 1 tablet (0.6 mg total) by mouth daily. 90 tablet 3  . hydrochlorothiazide (HYDRODIURIL) 25 MG tablet Take 1 tablet (25 mg total) by mouth daily. 90 tablet 3  . ibuprofen (ADVIL,MOTRIN) 600 MG tablet Take 1 tablet (600 mg total) by mouth every 6 (six) hours as needed. 60 tablet 5  . metFORMIN (GLUCOPHAGE-XR) 500 MG 24 hr tablet Take 1 tablet (500 mg total) by mouth daily with breakfast. 180 tablet 3  . metoprolol succinate (TOPROL XL) 50 MG 24 hr tablet Take 1 tablet (50 mg  total) by mouth daily. 30 tablet 5  . montelukast (SINGULAIR) 10 MG tablet Take 10 mg by mouth daily.      Marland Kitchen omeprazole (PRILOSEC) 20 MG capsule Take 1 capsule (20 mg total) by mouth 2 (two) times daily. 180 capsule 3  . potassium chloride SA (K-DUR,KLOR-CON) 20 MEQ tablet Kdur to 66meq take 2 tablets this am and 2 tablets at 2pm today then starting tomorrow take 2 tablets daily 64 tablet 3  . simvastatin (ZOCOR) 40 MG tablet Take 1 tablet (40 mg total) by mouth daily. 90 tablet 3  . zolpidem (AMBIEN) 5 MG tablet Take 1 tablet (5 mg total) by mouth at bedtime as needed for sleep. 90 tablet 1   No current facility-administered medications for this visit.    Allergies:   Ace inhibitors; Codeine; Hydrocodone; and Tramadol    Social History:  The patient  reports that she has never smoked. She does not have any smokeless tobacco history on file. She reports that she does not drink alcohol or use illicit drugs.   Family History:  The patient's family history includes Diabetes in her mother; Heart disease in her father.    ROS:  Please see the history of present illness.   Otherwise, review of systems are positive for none.   All other systems are reviewed and negative.    PHYSICAL EXAM: VS:  BP 176/94 mmHg  Pulse 88  Ht 5\' 3"  (1.6 m)  Wt 173 lb 12.8 oz (78.835 kg)  BMI 30.79 kg/m2 , BMI Body mass index is 30.79 kg/(m^2). GEN: Well nourished, well developed, in no acute distress HEENT: normal Neck: no JVD, carotid bruits, or masses Cardiac: RRR; no murmurs, rubs, or gallops,no edema  Respiratory:  clear to auscultation bilaterally, normal work of breathing GI: soft, nontender, nondistended, + BS MS: no deformity or atrophy Skin: warm and dry, no rash Neuro:  Strength and sensation are intact Psych: euthymic mood, full affect   EKG:  EKG was ordered today showing NSR at 88bpm with LAFB and LVH with repol abnormality    Recent Labs: 11/06/2013: TSH 1.41 01/05/2014: Hemoglobin 13.1;  Platelets 235.0 06/07/2014: ALT 7; BUN 19; Creatinine 0.9; Potassium 3.8; Sodium 142    Lipid Panel    Component Value Date/Time   CHOL 147 06/07/2014 1034   TRIG 98.0 06/07/2014 1034   HDL 36.40* 06/07/2014 1034   CHOLHDL 4 06/07/2014 1034   VLDL 19.6 06/07/2014 1034   LDLCALC 91 06/07/2014 1034   LDLDIRECT 139.1 09/05/2009 1052      Wt Readings from Last 3 Encounters:  10/06/14 173 lb 12.8 oz (78.835 kg)  09/29/14 173 lb (78.472 kg)  08/31/14 171 lb (77.565 kg)        ASSESSMENT AND PLAN:  1. Polymorphic VT secondary to PVC's with normal cardiac MRI/2D echo and cardiac cath.Currently on medical therapy per Dr. Caryl Comes - continue BB 2. HTN - poorly controlled but she says it is ok at home.  I have asked her to check her BP daily for a week and call with results.   - continue amlodipine/BB/HCTZ 3. DM - followup with PCP for routine care 4. Syncope secondary to #1 with no reoccurence 5. Atypical chest pain associated with increased SOB, wheezing at night and pleuritic CP that sounds like an asthma exacerbation.  It has resolved.  Cath less than a year ago showed normal coronary arteries and EKG is nonischemic and unchanged.  No further w/u.    Current medicines are reviewed at length with the patient today.  The patient does not have concerns regarding medicines.  The following changes have been made:  no change  Labs/ tests ordered today include: None   Orders Placed This Encounter  Procedures  . EKG 12-Lead     Disposition:   FU with me in 6 months   Signed, Sueanne Margarita, MD  10/06/2014 11:33 AM    [facility] [place], [place], [place]  [address] Phone: [phone]; Fax: [phone]

## 2014-10-06 ENCOUNTER — Ambulatory Visit (INDEPENDENT_AMBULATORY_CARE_PROVIDER_SITE_OTHER): Payer: BLUE CROSS/BLUE SHIELD | Admitting: Cardiology

## 2014-10-06 ENCOUNTER — Encounter: Payer: Self-pay | Admitting: Cardiology

## 2014-10-06 VITALS — BP 176/94 | HR 88 | Ht 63.0 in | Wt 173.8 lb

## 2014-10-06 DIAGNOSIS — I4729 Other ventricular tachycardia: Secondary | ICD-10-CM

## 2014-10-06 DIAGNOSIS — R0789 Other chest pain: Secondary | ICD-10-CM

## 2014-10-06 DIAGNOSIS — R55 Syncope and collapse: Secondary | ICD-10-CM

## 2014-10-06 DIAGNOSIS — I472 Ventricular tachycardia: Secondary | ICD-10-CM

## 2014-10-06 DIAGNOSIS — I10 Essential (primary) hypertension: Secondary | ICD-10-CM

## 2014-10-06 NOTE — Patient Instructions (Signed)
Check your blood pressure daily for one week and call with results.  Your physician wants you to follow-up in: 6 months with Dr. Radford Pax. You will receive a reminder letter in the mail two months in advance. If you don't receive a letter, please call our office to schedule the follow-up appointment.

## 2014-10-14 ENCOUNTER — Telehealth: Payer: Self-pay | Admitting: Cardiology

## 2014-10-14 NOTE — Telephone Encounter (Signed)
Continue current BP meds

## 2014-10-14 NOTE — Telephone Encounter (Signed)
Pt c/o BP issue: STAT if pt c/o blurred vision, one-sided weakness or slurred speech  1. What are your last 5 BP readings?  3.17.16   145/84 3.18.16   146/79 3.19.16   146/92 3.20.16   130/79 3.21.16   124/77 3.22.16   139/83 3.23.16   146/88 3.24.16   129/80 2. Are you having any other symptoms (ex. Dizziness, headache, blurred vision, passed out)? No  3. What is your BP issue? Pt was told by Dr Radford Pax to call and give her BP readings.

## 2014-10-14 NOTE — Telephone Encounter (Signed)
Calling with BP readings.  States these were taken in the mornings after taking her medication.  She is using a arm cuff.  States she feels fine, no c/o of headache or dizziness. Advised will forward to Dr. Radford Pax for review.

## 2014-10-14 NOTE — Telephone Encounter (Signed)
Notified pt that Dr. Radford Pax reviewed the BP readings and wants her to continue to take BP medications as directed.  She verbalizes understanding.

## 2014-11-09 ENCOUNTER — Other Ambulatory Visit (INDEPENDENT_AMBULATORY_CARE_PROVIDER_SITE_OTHER): Payer: BLUE CROSS/BLUE SHIELD

## 2014-11-09 ENCOUNTER — Ambulatory Visit (INDEPENDENT_AMBULATORY_CARE_PROVIDER_SITE_OTHER): Payer: BLUE CROSS/BLUE SHIELD | Admitting: Internal Medicine

## 2014-11-09 ENCOUNTER — Encounter: Payer: Self-pay | Admitting: Internal Medicine

## 2014-11-09 VITALS — BP 140/82 | HR 87 | Temp 98.5°F | Resp 18 | Ht 63.0 in | Wt 174.1 lb

## 2014-11-09 DIAGNOSIS — Z0189 Encounter for other specified special examinations: Secondary | ICD-10-CM | POA: Diagnosis not present

## 2014-11-09 DIAGNOSIS — E119 Type 2 diabetes mellitus without complications: Secondary | ICD-10-CM

## 2014-11-09 DIAGNOSIS — Z0000 Encounter for general adult medical examination without abnormal findings: Secondary | ICD-10-CM

## 2014-11-09 DIAGNOSIS — J4531 Mild persistent asthma with (acute) exacerbation: Secondary | ICD-10-CM

## 2014-11-09 DIAGNOSIS — G4700 Insomnia, unspecified: Secondary | ICD-10-CM

## 2014-11-09 DIAGNOSIS — J45901 Unspecified asthma with (acute) exacerbation: Secondary | ICD-10-CM | POA: Insufficient documentation

## 2014-11-09 LAB — HEPATIC FUNCTION PANEL
ALBUMIN: 4 g/dL (ref 3.5–5.2)
ALT: 13 U/L (ref 0–35)
AST: 18 U/L (ref 0–37)
Alkaline Phosphatase: 78 U/L (ref 39–117)
Bilirubin, Direct: 0.1 mg/dL (ref 0.0–0.3)
TOTAL PROTEIN: 7 g/dL (ref 6.0–8.3)
Total Bilirubin: 0.3 mg/dL (ref 0.2–1.2)

## 2014-11-09 LAB — MICROALBUMIN / CREATININE URINE RATIO
Creatinine,U: 156.9 mg/dL
MICROALB/CREAT RATIO: 0.6 mg/g (ref 0.0–30.0)
Microalb, Ur: 1 mg/dL (ref 0.0–1.9)

## 2014-11-09 LAB — URINALYSIS, ROUTINE W REFLEX MICROSCOPIC
Bilirubin Urine: NEGATIVE
KETONES UR: NEGATIVE
Nitrite: NEGATIVE
PH: 6 (ref 5.0–8.0)
Specific Gravity, Urine: 1.025 (ref 1.000–1.030)
Total Protein, Urine: NEGATIVE
URINE GLUCOSE: NEGATIVE
Urobilinogen, UA: 0.2 (ref 0.0–1.0)

## 2014-11-09 LAB — BASIC METABOLIC PANEL
BUN: 16 mg/dL (ref 6–23)
CHLORIDE: 103 meq/L (ref 96–112)
CO2: 32 meq/L (ref 19–32)
Calcium: 9.6 mg/dL (ref 8.4–10.5)
Creatinine, Ser: 1.11 mg/dL (ref 0.40–1.20)
GFR: 63.88 mL/min (ref >60.00)
GLUCOSE: 97 mg/dL (ref 70–99)
POTASSIUM: 3.6 meq/L (ref 3.5–5.1)
SODIUM: 141 meq/L (ref 135–145)

## 2014-11-09 LAB — CBC WITH DIFFERENTIAL/PLATELET
BASOS PCT: 0.4 % (ref 0.0–3.0)
Basophils Absolute: 0 10*3/uL (ref 0.0–0.1)
EOS PCT: 0.7 % (ref 0.0–5.0)
Eosinophils Absolute: 0 10*3/uL (ref 0.0–0.7)
HCT: 37.5 % (ref 36.0–46.0)
HEMOGLOBIN: 12.6 g/dL (ref 12.0–15.0)
LYMPHS ABS: 2.1 10*3/uL (ref 0.7–4.0)
Lymphocytes Relative: 31.6 % (ref 12.0–46.0)
MCHC: 33.5 g/dL (ref 30.0–36.0)
MCV: 80.3 fl (ref 78.0–100.0)
Monocytes Absolute: 0.7 10*3/uL (ref 0.1–1.0)
Monocytes Relative: 11.2 % (ref 3.0–12.0)
Neutro Abs: 3.7 10*3/uL (ref 1.4–7.7)
Neutrophils Relative %: 56.1 % (ref 43.0–77.0)
Platelets: 203 10*3/uL (ref 150.0–400.0)
RBC: 4.67 Mil/uL (ref 3.87–5.11)
RDW: 15.1 % (ref 11.5–15.5)
WBC: 6.6 10*3/uL (ref 4.0–10.5)

## 2014-11-09 LAB — TSH: TSH: 1.47 u[IU]/mL (ref 0.35–4.50)

## 2014-11-09 LAB — LIPID PANEL
CHOLESTEROL: 145 mg/dL (ref 0–200)
HDL: 46.4 mg/dL (ref >39.00)
LDL CALC: 77 mg/dL (ref 0–99)
NonHDL: 98.6
TRIGLYCERIDES: 110 mg/dL (ref 0.0–149.0)
Total CHOL/HDL Ratio: 3
VLDL: 22 mg/dL (ref 0.0–40.0)

## 2014-11-09 LAB — HEMOGLOBIN A1C: Hgb A1c MFr Bld: 5.9 % (ref 4.6–6.5)

## 2014-11-09 MED ORDER — POTASSIUM CHLORIDE CRYS ER 20 MEQ PO TBCR: EXTENDED_RELEASE_TABLET | ORAL | Status: DC

## 2014-11-09 MED ORDER — OMEPRAZOLE 20 MG PO CPDR: 20.0000 mg | DELAYED_RELEASE_CAPSULE | Freq: Two times a day (BID) | ORAL | Status: DC

## 2014-11-09 MED ORDER — BECLOMETHASONE DIPROPIONATE 80 MCG/ACT IN AERS: 2.0000 | INHALATION_SPRAY | Freq: Two times a day (BID) | RESPIRATORY_TRACT | Status: DC

## 2014-11-09 MED ORDER — BECLOMETHASONE DIPROPIONATE 80 MCG/ACT IN AERS
2.0000 | INHALATION_SPRAY | Freq: Two times a day (BID) | RESPIRATORY_TRACT | Status: DC
Start: 2014-11-09 — End: 2015-01-28

## 2014-11-09 MED ORDER — METHYLPREDNISOLONE ACETATE 80 MG/ML IJ SUSP
80.0000 mg | Freq: Once | INTRAMUSCULAR | Status: AC
  Administered 2014-11-09: 80 mg via INTRAMUSCULAR

## 2014-11-09 MED ORDER — PREDNISONE 10 MG PO TABS: 10.0000 mg | ORAL_TABLET | Freq: Every day | ORAL | Status: DC

## 2014-11-09 MED ORDER — TRAZODONE HCL 50 MG PO TABS: 50.0000 mg | ORAL_TABLET | Freq: Every day | ORAL | Status: DC

## 2014-11-09 MED ORDER — ALBUTEROL SULFATE HFA 108 (90 BASE) MCG/ACT IN AERS: 2.0000 | INHALATION_SPRAY | Freq: Four times a day (QID) | RESPIRATORY_TRACT | Status: DC | PRN

## 2014-11-09 NOTE — Progress Notes (Signed)
Subjective:    Patient ID: Brooke [name], female    DOB: 1951/05/05, 64 y.o.   MRN: [id number]  HPI  Here for wellness and f/u;  Overall doing ok;  Pt denies Chest pain, worsening orthopnea, PND, worsening LE edema, palpitations, dizziness or syncope.  Pt denies neurological change such as new headache, facial or extremity weakness.  Pt denies polydipsia, polyuria, or low sugar symptoms. Pt states overall good compliance with treatment and medications, good tolerability, and has been trying to follow appropriate diet.  Pt denies worsening depressive symptoms, suicidal ideation or panic. No fever, night sweats, wt loss, loss of appetite, or other constitutional symptoms.  Pt states good ability with ADL's, has low fall risk, home safety reviewed and adequate, no other significant changes in hearing or vision, and only occasionally active with exercise.   Has had mild worsening wheezing/sob worse at night for the past month ambien not working for sleep, needs change.  Past Medical History  Diagnosis Date  . ALOPECIA 12/30/2007  . ANEMIA-NOS 02/09/2008  . CHEST PAIN 02/28/2009  . COLD SORE 10/01/2007  . COLONIC POLYPS, HX OF 02/26/2007  . DIABETES MELLITUS, TYPE II 11/29/2008  . ECZEMA, ATOPIC DERMATITIS 09/19/2006  . FATIGUE 09/05/2009  . GERD 02/09/2008  . GOITER NOS 09/19/2006  . GOUT 08/09/2008  . HYPERLIPIDEMIA 10/01/2007  . HYPERTENSION 02/26/2007  . INSOMNIA NOS 09/19/2006  . OTITIS EXTERNA, RIGHT 02/28/2009  . Pain in joint, lower leg 10/01/2007  . SINUSITIS- ACUTE-NOS 05/20/2009  . SKIN LESION 08/09/2008  . Unspecified asthma(493.90) 09/19/2006  . Urinary frequency 11/29/2008  . Wheezing 12/30/2009  . Syncope 11/06/2013  . Ventricular tachycardia, polymorphic 01/04/2014  . Degenerative arthritis of left knee 05/18/2014   Past Surgical History  Procedure Laterality Date  . Cholecystectomy  3.1.2004  . Abdominal hysterectomy  10/22/1999    BSO  . L ras re-stented -  03/08/2005  . L ras tx'd  w/angioplasty  11/21/2003  . Left cataract surgury    . Radioactive iodine ablate  3/03    thyroid 10/07/2001  . Rastelli procedure  6/98 neg  . Split sleep study neg for sleep apnea  11/15/2003  . Left heart catheterization with coronary angiogram N/A 01/07/2014    Procedure: LEFT HEART CATHETERIZATION WITH CORONARY ANGIOGRAM;  Surgeon: Peter M Martinique, MD;  Location: [facility] CATH LAB;  Service: Cardiovascular;  Laterality: N/A;    reports that she has never smoked. She does not have any smokeless tobacco history on file. She reports that she does not drink alcohol or use illicit drugs. family history includes Diabetes in her mother; Heart disease in her father. Allergies  Allergen Reactions  . Ace Inhibitors Other (See Comments)    REACTION: Patient denies allery  . Codeine Nausea Only and Rash  . Hydrocodone Nausea Only  . Tramadol Nausea And Vomiting   Current Outpatient Prescriptions on File Prior to Visit  Medication Sig Dispense Refill  . amLODipine (NORVASC) 10 MG tablet Take 1 tablet (10 mg total) by mouth daily. 90 tablet 3  . benzonatate (TESSALON) 100 MG capsule Take 100 mg by mouth 2 (two) times daily as needed for cough.    . colchicine 0.6 MG tablet Take 1 tablet (0.6 mg total) by mouth daily. 90 tablet 3  . hydrochlorothiazide (HYDRODIURIL) 25 MG tablet Take 1 tablet (25 mg total) by mouth daily. 90 tablet 3  . ibuprofen (ADVIL,MOTRIN) 600 MG tablet Take 1 tablet (600 mg total) by mouth every 6 (six)  hours as needed. 60 tablet 5  . metFORMIN (GLUCOPHAGE-XR) 500 MG 24 hr tablet Take 1 tablet (500 mg total) by mouth daily with breakfast. 180 tablet 3  . metoprolol succinate (TOPROL XL) 50 MG 24 hr tablet Take 1 tablet (50 mg total) by mouth daily. 30 tablet 5  . montelukast (SINGULAIR) 10 MG tablet Take 10 mg by mouth daily.      . simvastatin (ZOCOR) 40 MG tablet Take 1 tablet (40 mg total) by mouth daily. 90 tablet 3  . zolpidem (AMBIEN) 5 MG tablet Take 1 tablet (5 mg total) by  mouth at bedtime as needed for sleep. 90 tablet 1   No current facility-administered medications on file prior to visit.   Review of Systems Constitutional: Negative for increased diaphoresis, other activity, appetite or siginficant weight change other than noted HENT: Negative for worsening hearing loss, ear pain, facial swelling, mouth sores and neck stiffness.   Eyes: Negative for other worsening pain, redness or visual disturbance.  Respiratory: Negative for shortness of breath and wheezing  Cardiovascular: Negative for chest pain and palpitations.  Gastrointestinal: Negative for diarrhea, blood in stool, abdominal distention or other pain Genitourinary: Negative for hematuria, flank pain or change in urine volume.  Musculoskeletal: Negative for myalgias or other joint complaints.  Skin: Negative for color change and wound or drainage.  Neurological: Negative for syncope and numbness. other than noted Hematological: Negative for adenopathy. or other swelling Psychiatric/Behavioral: Negative for hallucinations, SI, self-injury, decreased concentration or other worsening agitation.      Objective:   Physical Exam BP 140/82 mmHg  Pulse 87  Temp(Src) 98.5 F (36.9 C) (Oral)  Resp 18  Ht 5\' 3"  (1.6 m)  Wt 174 lb 1.9 oz (78.98 kg)  BMI 30.85 kg/m2  SpO2 97% VS noted,  Constitutional: Pt is oriented to person, place, and time. Appears well-developed and well-nourished, in no significant distress Head: Normocephalic and atraumatic.  Right Ear: External ear normal.  Left Ear: External ear normal.  Nose: Nose normal.  Mouth/Throat: Oropharynx is clear and moist.  Eyes: Conjunctivae and EOM are normal. Pupils are equal, round, and reactive to light.  Neck: Normal range of motion. Neck supple. No JVD present. No tracheal deviation present or significant neck LA or mass Cardiovascular: Normal rate, regular rhythm, normal heart sounds and intact distal pulses.   Pulmonary/Chest: Effort  normal and breath sounds decreased bialt without rales but few  Wheezing bilat  Abdominal: Soft. Bowel sounds are normal. NT. No HSM  Musculoskeletal: Normal range of motion. Exhibits no edema.  Lymphadenopathy:  Has no cervical adenopathy.  Neurological: Pt is alert and oriented to person, place, and time. Pt has normal reflexes. No cranial nerve deficit. Motor grossly intact Skin: Skin is warm and dry. No rash noted.  Psychiatric:  Has normal mood and affect. Behavior is normal.        Assessment & Plan:

## 2014-11-09 NOTE — Assessment & Plan Note (Signed)
Ok for change ambien per pt reqeust, to trazodone 50 qhs prn

## 2014-11-09 NOTE — Patient Instructions (Addendum)
You had the steroid shot today   Please take all new medication as prescribed - the prednisone, and the trazodone for sleep as needed  Please continue all other medications as before, and refills have been done if requested.  Please have the pharmacy call with any other refills you may need.  Please continue your efforts at being more active, low cholesterol diet, and weight control.  You are otherwise up to date with prevention measures today.  Please keep your appointments with your specialists as you may have planned  Please go to the LAB in the Basement (turn left off the elevator) for the tests to be done today  \You will be contacted by phone if any changes need to be made immediately.  Otherwise, you will receive a letter about your results with an explanation, but please check with MyChart first.  Please remember to sign up for MyChart if you have not done so, as this will be important to you in the future with finding out test results, communicating by private email, and scheduling acute appointments online when needed.  Please return in 6 months, or sooner if needed, with Lab testing done 3-5 days before (ok to cancel the Aug 2 appt - I will notify the schedulers)

## 2014-11-09 NOTE — Assessment & Plan Note (Signed)
stable overall by history and exam, recent data reviewed with pt, and pt to continue medical treatment as before,  to f/u any worsening symptoms or concerns Lab Results  Component Value Date   HGBA1C 5.7 06/07/2014   For f/u lab

## 2014-11-09 NOTE — Assessment & Plan Note (Signed)

## 2014-11-09 NOTE — Assessment & Plan Note (Signed)
Mild to mod, for depomedrol IM, predpac asd, cont all other meds,   to f/u any worsening symptoms or concerns

## 2014-11-09 NOTE — Progress Notes (Signed)
Pre visit review using our clinic review tool, if applicable. No additional management support is needed unless otherwise documented below in the visit note. 

## 2014-11-10 ENCOUNTER — Encounter: Payer: Self-pay | Admitting: Internal Medicine

## 2014-11-10 ENCOUNTER — Telehealth: Payer: Self-pay | Admitting: *Deleted

## 2014-11-10 NOTE — Telephone Encounter (Signed)
Received fax pt needing PA on her Qvar called insurance fax over PA form completed and fax back waiting on approval status...Johny Chess

## 2014-11-11 MED ORDER — FLUTICASONE PROPIONATE HFA 110 MCG/ACT IN AERO: 2.0000 | INHALATION_SPRAY | Freq: Two times a day (BID) | RESPIRATORY_TRACT | Status: DC

## 2014-11-11 NOTE — Telephone Encounter (Signed)
[place] for [employer] - done erx

## 2014-11-11 NOTE — Telephone Encounter (Signed)
Received PA back med has been denied. It states pt must have tried and failed formulary drugs Asmanex or Flovent...Brooke [name]

## 2014-11-12 ENCOUNTER — Telehealth: Payer: Self-pay

## 2014-11-12 NOTE — Telephone Encounter (Signed)
Notified pt of med change../lmb 

## 2014-11-12 NOTE — Telephone Encounter (Signed)
error 

## 2014-11-22 ENCOUNTER — Other Ambulatory Visit: Payer: Self-pay | Admitting: Cardiology

## 2015-01-13 ENCOUNTER — Encounter: Payer: Self-pay | Admitting: Cardiology

## 2015-01-28 ENCOUNTER — Encounter: Payer: Self-pay | Admitting: Family Medicine

## 2015-01-28 ENCOUNTER — Other Ambulatory Visit (INDEPENDENT_AMBULATORY_CARE_PROVIDER_SITE_OTHER): Payer: BLUE CROSS/BLUE SHIELD

## 2015-01-28 ENCOUNTER — Ambulatory Visit (INDEPENDENT_AMBULATORY_CARE_PROVIDER_SITE_OTHER): Payer: BLUE CROSS/BLUE SHIELD | Admitting: Family Medicine

## 2015-01-28 ENCOUNTER — Other Ambulatory Visit: Payer: BLUE CROSS/BLUE SHIELD

## 2015-01-28 ENCOUNTER — Ambulatory Visit (INDEPENDENT_AMBULATORY_CARE_PROVIDER_SITE_OTHER)
Admission: RE | Admit: 2015-01-28 | Discharge: 2015-01-28 | Disposition: A | Discharge status: Home / Self Care | Payer: BLUE CROSS/BLUE SHIELD | Source: Ambulatory Visit | Attending: Family Medicine | Admitting: Family Medicine

## 2015-01-28 VITALS — BP 132/84 | HR 89 | Wt 180.0 lb

## 2015-01-28 DIAGNOSIS — M25561 Pain in right knee: Secondary | ICD-10-CM

## 2015-01-28 DIAGNOSIS — M25461 Effusion, right knee: Secondary | ICD-10-CM | POA: Diagnosis not present

## 2015-01-28 MED ORDER — DOXYCYCLINE HYCLATE 100 MG PO TABS: 100.0000 mg | ORAL_TABLET | Freq: Two times a day (BID) | ORAL | Status: AC

## 2015-01-28 NOTE — Assessment & Plan Note (Signed)
She does have a knee effusion and does appear to be more of a uric acid crystals. We cannot rule out infection until labs are done. Patient has had no fevers or chills or any abnormal weight loss. I don't feel because it is the weekend to give her an aquatic just case. Patient knows what signs and symptoms to look for and when to start the medication. We discussed icing as well as patient was given a brace today. Patient will go down and have x-rays today to further evaluate. Patient does have colchicine and does take it on a regular basis. Patient is having breakthrough we may have to consider allopurinol or increasing patient's colchicine. Patient will come back and see me again in 2 weeks. Pain does not seem to get significantly better an MRI may be necessary. Patient does have significant instability of the knee. Patient may need also an autoimmune workup if nothing else presents itself with workup.  Spent  25 minutes with patient face-to-face and had greater than 50% of counseling including as described above in assessment and plan.

## 2015-01-28 NOTE — Patient Instructions (Addendum)
Good to see you.  Ice 20 minutes 2 times daily. Usually after activity and before bed. Exercises 3 times a week.  We will try another brace to give you support Watch you r diet a little bit if it is gout We will call you on the xrays See me again in 2 weeks or call me in 1 week if pain is not better We would need to consider an MRI.

## 2015-01-28 NOTE — Progress Notes (Signed)
[name] [address] [place], [place] [address] Phone: [phone] Subjective:    CC: Right knee pain  [id number] Brooke [name] is a 64 y.o. female coming in with complaint of right knee pain. Patient has been seen previously for a left knee pain. Patient does have mild arthritis of the left knee.  Patient states that that is popping and sometimes actually locking on her which is caused her to fall. Patient states she has fallen many times, pain is worsening.  Severity of pain is 8 out of 10. Patient is ambulating with the aid of a cane. Patient does not remember injury. Denies any fevers chills or any abnormal weight loss. This knee has never had this amount of pain before.      Past medical history, social, surgical and family history all reviewed in electronic medical record.   Review of Systems: No headache, visual changes, nausea, vomiting, diarrhea, constipation, dizziness, abdominal pain, skin rash, fevers, chills, night sweats, weight loss, swollen lymph nodes, body aches, joint swelling, muscle aches, chest pain, shortness of breath, mood changes.   Objective Blood pressure 132/84, pulse 89, weight 180 lb (81.647 kg), SpO2 95 %.  General: No apparent distress alert and oriented x3 mood and affect normal, dressed appropriately.  HEENT: Pupils equal, extraocular movements intact  Respiratory: Patient's speak in full sentences and does not appear short of breath  Cardiovascular: No lower extremity edema, non tender, no erythema  Skin: Warm dry intact with no signs of infection or rash on extremities or on axial skeleton.  Abdomen: Soft nontender  Neuro: Cranial nerves II through XII are intact, neurovascularly intact in all extremities with 2+ DTRs and 2+ pulses.  Lymph: No lymphadenopathy of posterior or anterior cervical chain or axillae bilaterally.  Gait normal with good balance and coordination.  MSK:  Non tender with full range of  motion and good stability and symmetric strength and tone of shoulders, elbows, wrist,   and ankles bilaterally.   Knee:right Vision noted Diffuse tenderness of the knee with mild warmth to touch. ROM full in flexion and extension and lower leg rotation. Ligaments with solid consistent endpoints including ACL, PCL, LCL, MCL. Negative Mcmurray's, Apley's, and Thessalonian tests.  painful patellar compression. Patellar glide with mild crepitus. Patellar and quadriceps tendons unremarkable. Hamstring and quadriceps strength is normal.    MSK US performed of: Right This study was ordered, performed, and interpreted by Charlann Boxer D.O.  Knee: All structures visualized. Positive joint effusion noted Significant degenerative changes of the medial and lateral meniscus with narrowing of the medial and lateral joint space. Patient does have calcific first uric acid deposits noted as well. Patellar Tendon unremarkable on long and transverse views without effusion. No abnormality of prepatellar bursa. LCL and MCL unremarkable on long and transverse views. No abnormality of origin of medial or lateral head of the gastrocnemius.  IMPRESSION:  Moderate to severe osteo-arthritis with knee effusion  Procedure: Real-time Ultrasound Guided aspiration and Injection of right knee Device: GE Logiq E  Ultrasound guided injection is preferred based studies that show increased duration, increased effect, greater accuracy, decreased procedural pain, increased response rate, and decreased cost with ultrasound guided versus blind injection.  Verbal informed consent obtained.  Time-out conducted.  Noted no overlying erythema, induration, or other signs of local infection.  Skin prepped in a sterile fashion.  Local anesthesia: Topical Ethyl chloride.  With sterile technique and under real time ultrasound guidance: With a 22-gauge  2 inch needle patient was injected with 4 cc of 0.5% Marcaine and removed 30 mL of  blood-tinged fluid with what appears to have crystal formations. Then 1 cc of Kenalog 40 mg/dL. This was from a superior lateral approach.  Completed without difficulty  Pain immediately resolved suggesting accurate placement of the medication.  Advised to call if fevers/chills, erythema, induration, drainage, or persistent bleeding.  Images permanently stored and available for review in the ultrasound unit.  Impression: Technically successful ultrasound guided injection.         Impression and Recommendations:     This case required medical decision making of moderate complexity.

## 2015-01-28 NOTE — Progress Notes (Signed)
Pre visit review using our clinic review tool, if applicable. No additional management support is needed unless otherwise documented below in the visit note. 

## 2015-01-29 LAB — SYNOVIAL CELL COUNT + DIFF, W/ CRYSTALS
Eosinophils-Synovial: 0 % (ref 0–1)
Lymphocytes-Synovial Fld: 45 % — ABNORMAL HIGH (ref 0–20)
Monocyte/Macrophage: 6 % — ABNORMAL LOW (ref 50–90)
Neutrophil, Synovial: 49 % — ABNORMAL HIGH (ref 0–25)

## 2015-02-08 ENCOUNTER — Encounter: Payer: Self-pay | Admitting: Gastroenterology

## 2015-02-15 ENCOUNTER — Encounter: Payer: Self-pay | Admitting: Family Medicine

## 2015-02-15 ENCOUNTER — Ambulatory Visit (INDEPENDENT_AMBULATORY_CARE_PROVIDER_SITE_OTHER): Payer: BLUE CROSS/BLUE SHIELD | Admitting: Family Medicine

## 2015-02-15 ENCOUNTER — Encounter: Payer: Self-pay | Admitting: *Deleted

## 2015-02-15 VITALS — BP 130/86 | HR 95 | Wt 183.0 lb

## 2015-02-15 DIAGNOSIS — M1000 Idiopathic gout, unspecified site: Secondary | ICD-10-CM

## 2015-02-15 DIAGNOSIS — M129 Arthropathy, unspecified: Secondary | ICD-10-CM | POA: Diagnosis not present

## 2015-02-15 DIAGNOSIS — IMO0002 Reserved for concepts with insufficient information to code with codable children: Secondary | ICD-10-CM

## 2015-02-15 DIAGNOSIS — M25561 Pain in right knee: Secondary | ICD-10-CM | POA: Insufficient documentation

## 2015-02-15 DIAGNOSIS — M25562 Pain in left knee: Secondary | ICD-10-CM

## 2015-02-15 MED ORDER — LOSARTAN POTASSIUM 50 MG PO TABS: 50.0000 mg | ORAL_TABLET | Freq: Every day | ORAL | Status: DC

## 2015-02-15 MED ORDER — ALLOPURINOL 100 MG PO TABS: 100.0000 mg | ORAL_TABLET | Freq: Every day | ORAL | Status: DC

## 2015-02-15 NOTE — Progress Notes (Signed)
Pre visit review using our clinic review tool, if applicable. No additional management support is needed unless otherwise documented below in the visit note. 

## 2015-02-15 NOTE — Progress Notes (Signed)
  [name] [address] [place], [place] [address] Phone: [phone] Subjective:    CC: Right knee pain follow-up  WTU:[id number] Brooke [name] is a 64 y.o. female coming in with complaint of right knee pain. Patient does have severe osteophytic changes of this right knee. Patient did have 30 mL of fluid removed previously. This did show possible gout. Patient states since then her knee continues to give her discomfort. States that it seems to come and go. Patient is wearing the knee brace on a regular basis. Patient states that that doesn't help as much as her contralateral side. Patient states that there is still some mild swelling. No new symptoms.     Past medical history, social, surgical and family history all reviewed in electronic medical record.   Review of Systems: No headache, visual changes, nausea, vomiting, diarrhea, constipation, dizziness, abdominal pain, skin rash, fevers, chills, night sweats, weight loss, swollen lymph nodes, body aches, joint swelling, muscle aches, chest pain, shortness of breath, mood changes.   Objective Blood pressure 130/86, pulse 95, weight 183 lb (83.008 kg), SpO2 98 %.  General: No apparent distress alert and oriented x3 mood and affect normal, dressed appropriately.  HEENT: Pupils equal, extraocular movements intact  Respiratory: Patient's speak in full sentences and does not appear short of breath  Cardiovascular: No lower extremity edema, non tender, no erythema  Skin: Warm dry intact with no signs of infection or rash on extremities or on axial skeleton.  Abdomen: Soft nontender  Neuro: Cranial nerves II through XII are intact, neurovascularly intact in all extremities with 2+ DTRs and 2+ pulses.  Lymph: No lymphadenopathy of posterior or anterior cervical chain or axillae bilaterally.  Gait normal with good balance and coordination.  MSK:  Non tender with full range of motion and good stability and  symmetric strength and tone of shoulders, elbows, wrist,   and ankles bilaterally.   Knee:right No effusion noted valgus deformity noted Diffuse tenderness of the knee but no longer warm to touch. ROM full in flexion and extension and lower leg rotation. Ligaments with solid consistent endpoints including ACL, PCL, LCL, MCL. Negative Mcmurray's, Apley's, and Thessalonian tests.  painful patellar compression. Patellar glide with mild crepitus. Patellar and quadriceps tendons unremarkable. Hamstring and quadriceps strength is normal.  Contralateral knee also has significant valgus deformity         Impression and Recommendations:     This case required medical decision making of moderate complexity.

## 2015-02-15 NOTE — Assessment & Plan Note (Signed)
Severe arthritic noted. Patient will continue with the bracing. We did do an injection previously 2 weeks ago. We can repeat every 3 months. Patient would be a candidate for viscous supplementation. Patient come back in 2-3 weeks for further evaluation.

## 2015-02-15 NOTE — Patient Instructions (Addendum)
Goo dot see you Stop the HCTZ Stop the potassium Losartan daily for blood pressure, if any swelling occurs to face call 911 immediatly  Start allopurinol daily  Colchicine 2 times daily for 5 days then back to daily.  Start exercises 3 times a week Come in for blood work in 2-3 weeks.  See me again in 4-6 weeks.

## 2015-02-15 NOTE — Assessment & Plan Note (Signed)
Patient's gout is likely contributing to most of her knee pain at this time. We did make some different changes to her medications. Patient will take colchicine twice daily for 5 days while she [place] allopurinol for 100 mg. Patient also had a change from hydrochlorothiazide to losartan. Patient chart states that patient does have an ACE inhibitor allergy though when asked patient denies allergy. This is written in the chart as well. Patient does give history of some possible anaphylactic or swelling of the lips but she does not remember what medication and is multiple years ago. When asked again about lisinopril she declines that that was the medication. Patient warned of risks and she states she wanted to try the medication. This will help decrease uric acid level as well. Patient and will come back for a basic metabolic panel in 2 weeks. Patient will then see me again in 4 weeks.

## 2015-02-20 ENCOUNTER — Other Ambulatory Visit: Payer: Self-pay | Admitting: Internal Medicine

## 2015-02-22 ENCOUNTER — Encounter: Payer: BLUE CROSS/BLUE SHIELD | Admitting: Internal Medicine

## 2015-03-22 ENCOUNTER — Ambulatory Visit: Payer: BLUE CROSS/BLUE SHIELD | Admitting: Family Medicine

## 2015-03-24 ENCOUNTER — Other Ambulatory Visit: Payer: Self-pay | Admitting: Internal Medicine

## 2015-04-05 ENCOUNTER — Ambulatory Visit (INDEPENDENT_AMBULATORY_CARE_PROVIDER_SITE_OTHER): Payer: BLUE CROSS/BLUE SHIELD | Admitting: Cardiology

## 2015-04-05 ENCOUNTER — Encounter: Payer: Self-pay | Admitting: Cardiology

## 2015-04-05 VITALS — BP 140/76 | HR 90 | Ht 63.0 in | Wt 175.8 lb

## 2015-04-05 DIAGNOSIS — I10 Essential (primary) hypertension: Secondary | ICD-10-CM | POA: Diagnosis not present

## 2015-04-05 DIAGNOSIS — I472 Ventricular tachycardia: Secondary | ICD-10-CM | POA: Diagnosis not present

## 2015-04-05 DIAGNOSIS — R55 Syncope and collapse: Secondary | ICD-10-CM | POA: Diagnosis not present

## 2015-04-05 DIAGNOSIS — I4729 Other ventricular tachycardia: Secondary | ICD-10-CM

## 2015-04-05 DIAGNOSIS — R0602 Shortness of breath: Secondary | ICD-10-CM | POA: Diagnosis not present

## 2015-04-05 LAB — CBC WITH DIFFERENTIAL/PLATELET
BASOS PCT: 0.5 % (ref 0.0–3.0)
Basophils Absolute: 0 10*3/uL (ref 0.0–0.1)
Eosinophils Absolute: 0.1 10*3/uL (ref 0.0–0.7)
Eosinophils Relative: 0.7 % (ref 0.0–5.0)
HEMATOCRIT: 38.6 % (ref 36.0–46.0)
Hemoglobin: 12.4 g/dL (ref 12.0–15.0)
LYMPHS PCT: 30.9 % (ref 12.0–46.0)
Lymphs Abs: 2.3 10*3/uL (ref 0.7–4.0)
MCHC: 32.2 g/dL (ref 30.0–36.0)
MCV: 83 fl (ref 78.0–100.0)
MONOS PCT: 10 % (ref 3.0–12.0)
Monocytes Absolute: 0.7 10*3/uL (ref 0.1–1.0)
Neutro Abs: 4.3 10*3/uL (ref 1.4–7.7)
Neutrophils Relative %: 57.9 % (ref 43.0–77.0)
Platelets: 240 10*3/uL (ref 150.0–400.0)
RBC: 4.65 Mil/uL (ref 3.87–5.11)
RDW: 14.4 % (ref 11.5–15.5)
WBC: 7.4 10*3/uL (ref 4.0–10.5)

## 2015-04-05 LAB — BASIC METABOLIC PANEL
BUN: 15 mg/dL (ref 6–23)
CHLORIDE: 103 meq/L (ref 96–112)
CO2: 31 meq/L (ref 19–32)
Calcium: 10.3 mg/dL (ref 8.4–10.5)
Creatinine, Ser: 1.01 mg/dL (ref 0.40–1.20)
GFR: 71.14 mL/min (ref >60.00)
Glucose, Bld: 86 mg/dL (ref 70–99)
POTASSIUM: 3.2 meq/L — AB (ref 3.5–5.1)
SODIUM: 143 meq/L (ref 135–145)

## 2015-04-05 LAB — BRAIN NATRIURETIC PEPTIDE: Pro B Natriuretic peptide (BNP): 50 pg/mL (ref 0.0–100.0)

## 2015-04-05 LAB — TSH: TSH: 2.11 u[IU]/mL (ref 0.35–4.50)

## 2015-04-05 NOTE — Progress Notes (Signed)
Cardiology Office Note   Date:  04/05/2015   ID:  Brooke [name], DOB 1951-02-16, MRN [id number]  PCP:  [name], MD    Chief Complaint  Patient presents with  . Irregular Heart Beat      History of Present Illness: Brooke [name] is a 64 y.o. female with a history of HTN, dyslipdemia, DM and syncope. She has chronic DOE but has asthma. She occsaionally has some LE edema. She has a history of polymorphic VT and was started on Toprol and a cardiac MRI was normal with normal EF 73%. Lexiscan myoview showed no ischemia. She subsequently underwent left heart cath which showed normal coronary arteries and normal LVF. She is being followed by Dr. Caryl Comes for her polymorphic VT that was felt to be triggered by PVC's. She has been having SOB for the past 2 months.  It occurs all day whether she is at rest or with exertion.  She denies any chest pain, LE edema, cough, fever, chills.  She has noticed some palptiations which make her SOB.    Past Medical History  Diagnosis Date  . ALOPECIA 12/30/2007  . ANEMIA-NOS 02/09/2008  . CHEST PAIN 02/28/2009  . COLD SORE 10/01/2007  . COLONIC POLYPS, HX OF 02/26/2007  . DIABETES MELLITUS, TYPE II 11/29/2008  . ECZEMA, ATOPIC DERMATITIS 09/19/2006  . FATIGUE 09/05/2009  . GERD 02/09/2008  . GOITER NOS 09/19/2006  . GOUT 08/09/2008  . HYPERLIPIDEMIA 10/01/2007  . HYPERTENSION 02/26/2007  . INSOMNIA NOS 09/19/2006  . OTITIS EXTERNA, RIGHT 02/28/2009  . Pain in joint, lower leg 10/01/2007  . SINUSITIS- ACUTE-NOS 05/20/2009  . SKIN LESION 08/09/2008  . Unspecified asthma(493.90) 09/19/2006  . Urinary frequency 11/29/2008  . Wheezing 12/30/2009  . Syncope 11/06/2013  . Ventricular tachycardia, polymorphic 01/04/2014  . Degenerative arthritis of left knee 05/18/2014    Past Surgical History  Procedure Laterality Date  . Cholecystectomy  3.1.2004  . Abdominal hysterectomy  10/22/1999    BSO  . L ras re-stented -  03/08/2005  . L ras tx'd  w/angioplasty  11/21/2003  . Left cataract surgury    . Radioactive iodine ablate  3/03    thyroid 10/07/2001  . Rastelli procedure  6/98 neg  . Split sleep study neg for sleep apnea  11/15/2003  . Left heart catheterization with coronary angiogram N/A 01/07/2014    Procedure: LEFT HEART CATHETERIZATION WITH CORONARY ANGIOGRAM;  Surgeon: Peter M Martinique, MD;  Location: [facility] CATH LAB;  Service: Cardiovascular;  Laterality: N/A;     Current Outpatient Prescriptions  Medication Sig Dispense Refill  . albuterol (PROVENTIL HFA;VENTOLIN HFA) 108 (90 BASE) MCG/ACT inhaler Inhale 2 puffs into the lungs every 6 (six) hours as needed for wheezing. 1 Inhaler 11  . allopurinol (ZYLOPRIM) 100 MG tablet Take 1 tablet (100 mg total) by mouth daily. 30 tablet 6  . amLODipine (NORVASC) 10 MG tablet Take 1 tablet (10 mg total) by mouth daily. 90 tablet 3  . colchicine 0.6 MG tablet Take 1 tablet (0.6 mg total) by mouth daily. 90 tablet 3  . fluticasone (FLOVENT HFA) 110 MCG/ACT inhaler Inhale 2 puffs into the lungs 2 (two) times daily. 1 Inhaler 12  . hydrochlorothiazide (HYDRODIURIL) 25 MG tablet TAKE ONE TABLET BY MOUTH ONCE DAILY 90 tablet 1  . ibuprofen (ADVIL,MOTRIN) 600 MG tablet TAKE ONE TABLET BY MOUTH EVERY 6 HOURS AS NEEDED 60 tablet  0  . metFORMIN (GLUCOPHAGE-XR) 500 MG 24 hr tablet Take 1 tablet (500 mg total) by mouth daily with breakfast. 180 tablet 3  . metoprolol succinate (TOPROL-XL) 50 MG 24 hr tablet TAKE ONE TABLET BY MOUTH ONCE DAILY 30 tablet 5  . montelukast (SINGULAIR) 10 MG tablet Take 10 mg by mouth daily.      Marland Kitchen omeprazole (PRILOSEC) 20 MG capsule Take 1 capsule (20 mg total) by mouth 2 (two) times daily. 180 capsule 3  . simvastatin (ZOCOR) 40 MG tablet Take 1 tablet (40 mg total) by mouth daily. 90 tablet 3  . traZODone (DESYREL) 50 MG tablet Take 1 tablet (50 mg total) by mouth at bedtime. 90 tablet 1  . zolpidem (AMBIEN) 5 MG tablet Take 1 tablet (5 mg total) by mouth at bedtime as  needed for sleep. 90 tablet 1   No current facility-administered medications for this visit.    Allergies:   Ace inhibitors; Codeine; Hydrocodone; and Tramadol    Social History:  The patient  reports that she has never smoked. She does not have any smokeless tobacco history on file. She reports that she does not drink alcohol or use illicit drugs.   Family History:  The patient's family history includes Diabetes in her mother; Heart disease in her father.    ROS:  Please see the history of present illness.   Otherwise, review of systems are positive for none.   All other systems are reviewed and negative.    PHYSICAL EXAM: VS:  BP 140/76 mmHg  Pulse 90  Ht 5\' 3"  (1.6 m)  Wt 175 lb 12.8 oz (79.742 kg)  BMI 31.15 kg/m2  SpO2 98% , BMI Body mass index is 31.15 kg/(m^2). GEN: Well nourished, well developed, in no acute distress HEENT: normal Neck: no JVD, carotid bruits, or masses Cardiac: RRR; no rubs, or gallops,no edema.  2/6 SM at RUSB and LLSB Respiratory:  clear to auscultation bilaterally, normal work of breathing GI: soft, nontender, nondistended, + BS MS: no deformity or atrophy Skin: warm and dry, no rash Neuro:  Strength and sensation are intact Psych: euthymic mood, full affect   EKG:  EKG was ordered today and showed NSR with LAD, LAFB and LVH by voltage    Recent Labs: 11/09/2014: ALT 13; BUN 16; Creatinine, Ser 1.11; Hemoglobin 12.6; Platelets 203.0; Potassium 3.6; Sodium 141; TSH 1.47    Lipid Panel    Component Value Date/Time   CHOL 145 11/09/2014 1137   TRIG 110.0 11/09/2014 1137   HDL 46.40 11/09/2014 1137   CHOLHDL 3 11/09/2014 1137   VLDL 22.0 11/09/2014 1137   LDLCALC 77 11/09/2014 1137   LDLDIRECT 139.1 09/05/2009 1052      Wt Readings from Last 3 Encounters:  04/05/15 175 lb 12.8 oz (79.742 kg)  02/15/15 183 lb (83.008 kg)  01/28/15 180 lb (81.647 kg)    ASSESSMENT AND PLAN:  1. Polymorphic VT secondary to PVC's with normal cardiac  MRI/2D echo and cardiac cath.Currently on medical therapy per Dr. Caryl Comes.  She is now complaining of palpitations so I will get a 30 day heart monitor to assess.   - continue BB 2. HTN -  controlled. - continue amlodipine/BB/HCTZ 3. DM - followup with PCP for routine care 4. Syncope secondary to #1 with no reoccurence 5. Chronic SOB that seems worse over the past 2 months.  I will get a 2D echo to reassess LVF and check TSH, CBC and BNP.  I suspect this  is related to asthma.  I will also check a chest xray.    Current medicines are reviewed at length with the patient today.  The patient does not have concerns regarding medicines.  The following changes have been made:  no change  Labs/ tests ordered today: See above Assessment and Plan No orders of the defined types were placed in this encounter.     Disposition:   FU with me in 6 months  Signed, Sueanne Margarita, MD  04/05/2015 3:28 PM    [facility] [place], [place], [place]  [address] Phone: [phone]; Fax: [phone]

## 2015-04-05 NOTE — Patient Instructions (Addendum)
Medication Instructions:  Your physician recommends that you continue on your current medications as directed. Please refer to the Current Medication list given to you today.   Labwork: TODAY: BMET, CBC, TSH, BNP  Testing/Procedures: Your physician has requested that you have an echocardiogram. Echocardiography is a painless test that uses sound waves to create images of your heart. It provides your doctor with information about the size and shape of your heart and how well your heart's chambers and valves are working. This procedure takes approximately one hour. There are no restrictions for this procedure.   Dr. Radford Pax recommends you have a CHEST XRAY at [facility].  Follow-Up: Your physician wants you to follow-up in: 6 months with Dr. Radford Pax. You will receive a reminder letter in the mail two months in advance. If you don't receive a letter, please call our office to schedule the follow-up appointment.   Any Other Special Instructions Will Be Listed Below (If Applicable).

## 2015-04-06 ENCOUNTER — Telehealth: Payer: Self-pay

## 2015-04-06 DIAGNOSIS — I10 Essential (primary) hypertension: Secondary | ICD-10-CM

## 2015-04-06 MED ORDER — POTASSIUM CHLORIDE CRYS ER 20 MEQ PO TBCR: 20.0000 meq | EXTENDED_RELEASE_TABLET | Freq: Every day | ORAL | Status: DC

## 2015-04-06 NOTE — Telephone Encounter (Signed)
Informed patient of results and verbal understanding expressed.  Instructed patient to START POTASSIUM 20 meq.  Patient understands to take 2 tablets now and 2 tablets before she goes to bed today then once daily starting tomorrow. Repeat BMET scheduled for next Tuesday per pt request. Patient agrees with treatment plan.

## 2015-04-06 NOTE — Telephone Encounter (Signed)
-----   Message from Sueanne Margarita, MD sent at 04/05/2015 10:17 PM EDT ----- Labs normal except for potassium - add Kdur 79meq and have her take 2 tablets tomorrow am(9/14) and repeat 2 tablets tomorrow PM and then starting Thursday 9/15 take 54meq daily and recheck BMET on Monday

## 2015-04-07 ENCOUNTER — Ambulatory Visit
Admission: RE | Admit: 2015-04-07 | Discharge: 2015-04-07 | Disposition: A | Discharge status: Home / Self Care | Payer: BLUE CROSS/BLUE SHIELD | Source: Ambulatory Visit | Attending: Cardiology | Admitting: Cardiology

## 2015-04-07 DIAGNOSIS — R0602 Shortness of breath: Secondary | ICD-10-CM

## 2015-04-12 ENCOUNTER — Telehealth: Payer: Self-pay

## 2015-04-12 ENCOUNTER — Other Ambulatory Visit (INDEPENDENT_AMBULATORY_CARE_PROVIDER_SITE_OTHER): Payer: BLUE CROSS/BLUE SHIELD | Admitting: *Deleted

## 2015-04-12 DIAGNOSIS — I10 Essential (primary) hypertension: Secondary | ICD-10-CM

## 2015-04-12 LAB — BASIC METABOLIC PANEL
BUN: 14 mg/dL (ref 6–23)
CHLORIDE: 104 meq/L (ref 96–112)
CO2: 30 meq/L (ref 19–32)
Calcium: 9.3 mg/dL (ref 8.4–10.5)
Creatinine, Ser: 1.04 mg/dL (ref 0.40–1.20)
GFR: 68.77 mL/min (ref >60.00)
Glucose, Bld: 102 mg/dL — ABNORMAL HIGH (ref 70–99)
POTASSIUM: 3.1 meq/L — AB (ref 3.5–5.1)
Sodium: 142 mEq/L (ref 135–145)

## 2015-04-12 NOTE — Telephone Encounter (Signed)
Informed patient of results and verbal understanding expressed.  Confirmed with patient she is taking potassium 20 meq daily. Per replacement protocol, instructed patient to take an extra dose of potassium today and an extra dose tomorrow and then resume normal dosing daily. Repeat BMET scheduled for next Tuesday.

## 2015-04-14 ENCOUNTER — Telehealth: Payer: Self-pay

## 2015-04-14 MED ORDER — POTASSIUM CHLORIDE CRYS ER 20 MEQ PO TBCR: 40.0000 meq | EXTENDED_RELEASE_TABLET | Freq: Every day | ORAL | Status: DC

## 2015-04-14 NOTE — Telephone Encounter (Signed)
Instructed patient to INCREASE KDUR to 20 meq 2 tablets daily.  Confirmed lab draw on Tuesday. Patient agrees with treatment plan.

## 2015-04-14 NOTE — Telephone Encounter (Signed)
-----   Message from Sueanne Margarita, MD sent at 04/13/2015  6:22 PM EDT ----- Increase Kdur to 78meq 2 tablets daily

## 2015-04-19 ENCOUNTER — Ambulatory Visit (HOSPITAL_COMMUNITY): Payer: BLUE CROSS/BLUE SHIELD | Attending: Cardiology

## 2015-04-19 ENCOUNTER — Other Ambulatory Visit: Payer: Self-pay

## 2015-04-19 ENCOUNTER — Other Ambulatory Visit (INDEPENDENT_AMBULATORY_CARE_PROVIDER_SITE_OTHER): Payer: BLUE CROSS/BLUE SHIELD | Admitting: *Deleted

## 2015-04-19 DIAGNOSIS — R0602 Shortness of breath: Secondary | ICD-10-CM

## 2015-04-19 DIAGNOSIS — E119 Type 2 diabetes mellitus without complications: Secondary | ICD-10-CM | POA: Insufficient documentation

## 2015-04-19 DIAGNOSIS — I10 Essential (primary) hypertension: Secondary | ICD-10-CM | POA: Diagnosis not present

## 2015-04-19 DIAGNOSIS — R5383 Other fatigue: Secondary | ICD-10-CM | POA: Diagnosis not present

## 2015-04-19 DIAGNOSIS — R55 Syncope and collapse: Secondary | ICD-10-CM | POA: Insufficient documentation

## 2015-04-19 DIAGNOSIS — I472 Ventricular tachycardia: Secondary | ICD-10-CM | POA: Insufficient documentation

## 2015-04-19 LAB — BASIC METABOLIC PANEL
BUN: 15 mg/dL (ref 6–23)
CHLORIDE: 105 meq/L (ref 96–112)
CO2: 28 mEq/L (ref 19–32)
CREATININE: 1.07 mg/dL (ref 0.40–1.20)
Calcium: 9.8 mg/dL (ref 8.4–10.5)
GFR: 66.55 mL/min (ref >60.00)
GLUCOSE: 103 mg/dL — AB (ref 70–99)
POTASSIUM: 4.7 meq/L (ref 3.5–5.1)
Sodium: 141 mEq/L (ref 135–145)

## 2015-04-25 ENCOUNTER — Ambulatory Visit: Payer: BLUE CROSS/BLUE SHIELD | Admitting: Family Medicine

## 2015-04-28 ENCOUNTER — Other Ambulatory Visit: Payer: Self-pay | Admitting: Internal Medicine

## 2015-05-11 ENCOUNTER — Other Ambulatory Visit (INDEPENDENT_AMBULATORY_CARE_PROVIDER_SITE_OTHER): Payer: BLUE CROSS/BLUE SHIELD

## 2015-05-11 ENCOUNTER — Encounter: Payer: Self-pay | Admitting: Internal Medicine

## 2015-05-11 ENCOUNTER — Ambulatory Visit (INDEPENDENT_AMBULATORY_CARE_PROVIDER_SITE_OTHER): Payer: BLUE CROSS/BLUE SHIELD | Admitting: Internal Medicine

## 2015-05-11 VITALS — BP 160/92 | HR 83 | Wt 176.0 lb

## 2015-05-11 DIAGNOSIS — I10 Essential (primary) hypertension: Secondary | ICD-10-CM

## 2015-05-11 DIAGNOSIS — E119 Type 2 diabetes mellitus without complications: Secondary | ICD-10-CM

## 2015-05-11 DIAGNOSIS — E785 Hyperlipidemia, unspecified: Secondary | ICD-10-CM

## 2015-05-11 DIAGNOSIS — J4520 Mild intermittent asthma, uncomplicated: Secondary | ICD-10-CM

## 2015-05-11 DIAGNOSIS — Z0000 Encounter for general adult medical examination without abnormal findings: Secondary | ICD-10-CM

## 2015-05-11 DIAGNOSIS — Z0189 Encounter for other specified special examinations: Secondary | ICD-10-CM

## 2015-05-11 DIAGNOSIS — Z23 Encounter for immunization: Secondary | ICD-10-CM | POA: Diagnosis not present

## 2015-05-11 LAB — HEPATIC FUNCTION PANEL
ALT: 6 U/L (ref 0–35)
AST: 12 U/L (ref 0–37)
Albumin: 4.1 g/dL (ref 3.5–5.2)
Alkaline Phosphatase: 81 U/L (ref 39–117)
BILIRUBIN DIRECT: 0 mg/dL (ref 0.0–0.3)
BILIRUBIN TOTAL: 0.3 mg/dL (ref 0.2–1.2)
TOTAL PROTEIN: 7.4 g/dL (ref 6.0–8.3)

## 2015-05-11 LAB — BASIC METABOLIC PANEL
BUN: 14 mg/dL (ref 6–23)
CALCIUM: 10.2 mg/dL (ref 8.4–10.5)
CO2: 28 mEq/L (ref 19–32)
CREATININE: 1.04 mg/dL (ref 0.40–1.20)
Chloride: 106 mEq/L (ref 96–112)
GFR: 68.76 mL/min (ref >60.00)
Glucose, Bld: 103 mg/dL — ABNORMAL HIGH (ref 70–99)
Potassium: 4.5 mEq/L (ref 3.5–5.1)
Sodium: 142 mEq/L (ref 135–145)

## 2015-05-11 LAB — LIPID PANEL
Cholesterol: 154 mg/dL (ref 0–200)
HDL: 49.3 mg/dL (ref >39.00)
LDL Cholesterol: 90 mg/dL (ref 0–99)
NONHDL: 104.97
Total CHOL/HDL Ratio: 3
Triglycerides: 77 mg/dL (ref 0.0–149.0)
VLDL: 15.4 mg/dL (ref 0.0–40.0)

## 2015-05-11 LAB — HEMOGLOBIN A1C: Hgb A1c MFr Bld: 5.9 % (ref 4.6–6.5)

## 2015-05-11 MED ORDER — HYDROCHLOROTHIAZIDE 25 MG PO TABS: 25.0000 mg | ORAL_TABLET | Freq: Every day | ORAL | Status: DC

## 2015-05-11 MED ORDER — IBUPROFEN 600 MG PO TABS: 600.0000 mg | ORAL_TABLET | Freq: Four times a day (QID) | ORAL | Status: DC | PRN

## 2015-05-11 MED ORDER — IRBESARTAN 75 MG PO TABS: 75.0000 mg | ORAL_TABLET | Freq: Every day | ORAL | Status: DC

## 2015-05-11 MED ORDER — SIMVASTATIN 40 MG PO TABS: 40.0000 mg | ORAL_TABLET | Freq: Every day | ORAL | Status: DC

## 2015-05-11 MED ORDER — AMLODIPINE BESYLATE 10 MG PO TABS: 10.0000 mg | ORAL_TABLET | Freq: Every day | ORAL | Status: DC

## 2015-05-11 MED ORDER — ZOLPIDEM TARTRATE 5 MG PO TABS: 5.0000 mg | ORAL_TABLET | Freq: Every evening | ORAL | Status: DC | PRN

## 2015-05-11 NOTE — Progress Notes (Signed)
Pre visit review using our clinic review tool, if applicable. No additional management support is needed unless otherwise documented below in the visit note. 

## 2015-05-11 NOTE — Assessment & Plan Note (Signed)
stable overall by history and exam, recent data reviewed with pt, and pt to continue medical treatment as before,  to f/u any worsening symptoms or concerns Lab Results  Component Value Date   LDLCALC 77 11/09/2014

## 2015-05-11 NOTE — Assessment & Plan Note (Signed)
stable overall by history and exam, recent data reviewed with pt, and pt to continue medical treatment as before,  to f/u any worsening symptoms or concerns SpO2 Readings from Last 3 Encounters:  05/11/15 97%  04/05/15 98%  02/15/15 98%

## 2015-05-11 NOTE — Progress Notes (Signed)
Subjective:    Patient ID: Brooke [name], female    DOB: 1951-06-07, 64 y.o.   MRN: [id number]  HPI  Here to f/u; overall doing ok,  Pt denies chest pain, increasing sob or doe, wheezing, orthopnea, PND, increased LE swelling, palpitations, dizziness or syncope.  Pt denies new neurological symptoms such as new headache, or facial or extremity weakness or numbness.  Pt denies polydipsia, polyuria, or low sugar episode.   Pt denies new neurological symptoms such as new headache, or facial or extremity weakness or numbness.   Pt states overall good compliance with meds, mostly trying to follow appropriate diet, with wt overall stable,  but little exercise however. BP Readings from Last 3 Encounters:  05/11/15 160/92  04/05/15 140/76  02/15/15 130/86  Due for flu shot Past Medical History  Diagnosis Date  . ALOPECIA 12/30/2007  . ANEMIA-NOS 02/09/2008  . CHEST PAIN 02/28/2009  . COLD SORE 10/01/2007  . COLONIC POLYPS, HX OF 02/26/2007  . DIABETES MELLITUS, TYPE II 11/29/2008  . ECZEMA, ATOPIC DERMATITIS 09/19/2006  . FATIGUE 09/05/2009  . GERD 02/09/2008  . GOITER NOS 09/19/2006  . GOUT 08/09/2008  . HYPERLIPIDEMIA 10/01/2007  . HYPERTENSION 02/26/2007  . INSOMNIA NOS 09/19/2006  . OTITIS EXTERNA, RIGHT 02/28/2009  . Pain in joint, lower leg 10/01/2007  . SINUSITIS- ACUTE-NOS 05/20/2009  . SKIN LESION 08/09/2008  . Unspecified asthma(493.90) 09/19/2006  . Urinary frequency 11/29/2008  . Wheezing 12/30/2009  . Syncope 11/06/2013  . Ventricular tachycardia, polymorphic ([place]) 01/04/2014  . Degenerative arthritis of left knee 05/18/2014   Past Surgical History  Procedure Laterality Date  . Cholecystectomy  3.1.2004  . Abdominal hysterectomy  10/22/1999    BSO  . L ras re-stented -  03/08/2005  . L ras tx'd w/angioplasty  11/21/2003  . Left cataract surgury    . Radioactive iodine ablate  3/03    thyroid 10/07/2001  . Rastelli procedure  6/98 neg  . Split sleep study neg for sleep apnea  11/15/2003  . Left  heart catheterization with coronary angiogram N/A 01/07/2014    Procedure: LEFT HEART CATHETERIZATION WITH CORONARY ANGIOGRAM;  Surgeon: Peter M Martinique, MD;  Location: [facility] CATH LAB;  Service: Cardiovascular;  Laterality: N/A;    reports that she has never smoked. She does not have any smokeless tobacco history on file. She reports that she does not drink alcohol or use illicit drugs. family history includes Diabetes in her mother; Heart disease in her father. Allergies  Allergen Reactions  . Ace Inhibitors Other (See Comments)    REACTION: Patient denies allery  . Codeine Nausea Only and Rash  . Hydrocodone Nausea Only  . Tramadol Nausea And Vomiting   Current Outpatient Prescriptions on File Prior to Visit  Medication Sig Dispense Refill  . albuterol (PROVENTIL HFA;VENTOLIN HFA) 108 (90 BASE) MCG/ACT inhaler Inhale 2 puffs into the lungs every 6 (six) hours as needed for wheezing. 1 Inhaler 11  . allopurinol (ZYLOPRIM) 100 MG tablet Take 1 tablet (100 mg total) by mouth daily. 30 tablet 6  . colchicine 0.6 MG tablet Take 1 tablet (0.6 mg total) by mouth daily. 90 tablet 3  . fluticasone (FLOVENT HFA) 110 MCG/ACT inhaler Inhale 2 puffs into the lungs 2 (two) times daily. 1 Inhaler 12  . metFORMIN (GLUCOPHAGE-XR) 500 MG 24 hr tablet Take 1 tablet (500 mg total) by mouth daily with breakfast. 180 tablet 3  . metoprolol succinate (TOPROL-XL) 50 MG 24 hr tablet TAKE ONE TABLET  BY MOUTH ONCE DAILY 30 tablet 5  . montelukast (SINGULAIR) 10 MG tablet Take 10 mg by mouth daily.      Marland Kitchen omeprazole (PRILOSEC) 20 MG capsule Take 1 capsule (20 mg total) by mouth 2 (two) times daily. 180 capsule 3  . potassium chloride SA (K-DUR,KLOR-CON) 20 MEQ tablet Take 2 tablets (40 mEq total) by mouth daily. 180 tablet 2  . traZODone (DESYREL) 50 MG tablet Take 1 tablet (50 mg total) by mouth at bedtime. 90 tablet 1   No current facility-administered medications on file prior to visit.   Review of Systems   Constitutional: Negative for unusual diaphoresis or night sweats HENT: Negative for ringing in ear or discharge Eyes: Negative for double vision or worsening visual disturbance.  Respiratory: Negative for choking and stridor.   Gastrointestinal: Negative for vomiting or other signifcant bowel change Genitourinary: Negative for hematuria or change in urine volume.  Musculoskeletal: Negative for other MSK pain or swelling Skin: Negative for color change and worsening wound.  Neurological: Negative for tremors and numbness other than noted  Psychiatric/Behavioral: Negative for decreased concentration or agitation other than above       Objective:   Physical Exam BP 160/92 mmHg  Pulse 83  Wt 176 lb (79.833 kg)  SpO2 97% VS noted,  Constitutional: Pt appears in no significant distress HENT: Head: NCAT.  Right Ear: External ear normal.  Left Ear: External ear normal.  Eyes: . Pupils are equal, round, and reactive to light. Conjunctivae and EOM are normal Neck: Normal range of motion. Neck supple.  Cardiovascular: Normal rate and regular rhythm.   Pulmonary/Chest: Effort normal and breath sounds without rales or wheezing.  Neurological: Pt is alert. Not confused , motor grossly intact Skin: Skin is warm. No rash, no LE edema Psychiatric: Pt behavior is normal. No agitation.     Assessment & Plan:

## 2015-05-11 NOTE — Assessment & Plan Note (Signed)
stable overall by history and exam, recent data reviewed with pt, and pt to continue medical treatment as before,  to f/u any worsening symptoms or concerns, for a1c today, f/u with endo as planned

## 2015-05-11 NOTE — Assessment & Plan Note (Signed)
Mild uncontrolled, to add avapro 75 qd, o/w stable overall by history and exam, recent data reviewed with pt, and pt to continue medical treatment as before,  to f/u any worsening symptoms or concerns BP Readings from Last 3 Encounters:  05/11/15 160/92  04/05/15 140/76  02/15/15 130/86

## 2015-05-11 NOTE — Patient Instructions (Addendum)
You had the flu shot today  Please take all new medication as prescribed - the low dose avapro 75 mg for blood pressure (sent to [facility])  Please continue all other medications as before, and refills have been done if requested - with the rest done in hardcopy (printed)  Please have the pharmacy call with any other refills you may need.  Please continue your efforts at being more active, low cholesterol diet, and weight control.  Please keep your appointments with your specialists as you may have planned  Please go to the LAB in the Basement (turn left off the elevator) for the tests to be done today  You will be contacted by phone if any changes need to be made immediately.  Otherwise, you will receive a letter about your results with an explanation, but please check with MyChart first.  Please remember to sign up for MyChart if you have not done so, as this will be important to you in the future with finding out test results, communicating by private email, and scheduling acute appointments online when needed.  Please return in 6 months, or sooner if needed, with Lab testing done 3-5 days before

## 2015-05-11 NOTE — Addendum Note (Signed)
Addended by: Cresenciano Lick on: 05/11/2015 11:28 AM   Modules accepted: Orders

## 2015-05-24 ENCOUNTER — Other Ambulatory Visit: Payer: Self-pay | Admitting: Cardiology

## 2015-06-08 ENCOUNTER — Ambulatory Visit: Payer: BLUE CROSS/BLUE SHIELD | Admitting: Family Medicine

## 2015-07-22 ENCOUNTER — Other Ambulatory Visit: Payer: Self-pay | Admitting: Internal Medicine

## 2015-08-24 ENCOUNTER — Telehealth: Payer: Self-pay | Admitting: *Deleted

## 2015-08-24 MED ORDER — ROSUVASTATIN CALCIUM 20 MG PO TABS: 20.0000 mg | ORAL_TABLET | Freq: Every day | ORAL | Status: DC

## 2015-08-24 NOTE — Telephone Encounter (Signed)
At this point, it would be better to stop the zocor, and change this to crestor 20 mg as this should be about the same  To cont the amlod 10 qd  Please let pt know

## 2015-08-24 NOTE — Telephone Encounter (Signed)
Left msg on triage stating wanting to know is md aware of drug interaction on Zocor & Amlodipine. Is it ok to refill...Brooke [name]

## 2015-08-25 MED ORDER — ROSUVASTATIN CALCIUM 20 MG PO TABS: 20.0000 mg | ORAL_TABLET | Freq: Every day | ORAL | Status: DC

## 2015-08-25 NOTE — Telephone Encounter (Signed)
Notified pt of med change. Called [facility] spoke with pharmacist gave md response. Resent crestor to [facility].../lmb

## 2015-09-22 ENCOUNTER — Other Ambulatory Visit: Payer: Self-pay

## 2015-09-22 DIAGNOSIS — M1000 Idiopathic gout, unspecified site: Secondary | ICD-10-CM

## 2015-09-22 MED ORDER — ALLOPURINOL 100 MG PO TABS: 100.0000 mg | ORAL_TABLET | Freq: Every day | ORAL | Status: DC

## 2015-10-22 ENCOUNTER — Other Ambulatory Visit: Payer: Self-pay | Admitting: Internal Medicine

## 2015-11-01 ENCOUNTER — Telehealth: Payer: Self-pay

## 2015-11-01 NOTE — Telephone Encounter (Signed)
SCAT Application signed, mailed back per pt request. Copy sent to scan

## 2015-11-09 ENCOUNTER — Ambulatory Visit: Payer: BLUE CROSS/BLUE SHIELD | Admitting: Internal Medicine

## 2015-11-15 ENCOUNTER — Other Ambulatory Visit: Payer: Self-pay | Admitting: Internal Medicine

## 2015-11-17 ENCOUNTER — Other Ambulatory Visit: Payer: Self-pay | Admitting: Internal Medicine

## 2015-11-17 ENCOUNTER — Other Ambulatory Visit: Payer: Self-pay | Admitting: Cardiology

## 2015-12-20 ENCOUNTER — Other Ambulatory Visit: Payer: Self-pay | Admitting: Internal Medicine

## 2016-01-19 ENCOUNTER — Other Ambulatory Visit: Payer: Self-pay | Admitting: Internal Medicine

## 2016-02-12 ENCOUNTER — Emergency Department (HOSPITAL_COMMUNITY): Payer: PRIVATE HEALTH INSURANCE

## 2016-02-12 ENCOUNTER — Encounter (HOSPITAL_COMMUNITY): Payer: Self-pay | Admitting: Emergency Medicine

## 2016-02-12 ENCOUNTER — Emergency Department (HOSPITAL_COMMUNITY)
Admission: EM | Admit: 2016-02-12 | Discharge: 2016-02-12 | Disposition: A | Discharge status: Home / Self Care | Payer: PRIVATE HEALTH INSURANCE | Attending: Emergency Medicine | Admitting: Emergency Medicine

## 2016-02-12 DIAGNOSIS — Z86010 Personal history of colonic polyps: Secondary | ICD-10-CM | POA: Insufficient documentation

## 2016-02-12 DIAGNOSIS — Z7951 Long term (current) use of inhaled steroids: Secondary | ICD-10-CM | POA: Diagnosis not present

## 2016-02-12 DIAGNOSIS — Z79899 Other long term (current) drug therapy: Secondary | ICD-10-CM | POA: Insufficient documentation

## 2016-02-12 DIAGNOSIS — M79602 Pain in left arm: Secondary | ICD-10-CM | POA: Diagnosis not present

## 2016-02-12 DIAGNOSIS — E119 Type 2 diabetes mellitus without complications: Secondary | ICD-10-CM | POA: Diagnosis not present

## 2016-02-12 DIAGNOSIS — Z7984 Long term (current) use of oral hypoglycemic drugs: Secondary | ICD-10-CM | POA: Diagnosis not present

## 2016-02-12 DIAGNOSIS — R42 Dizziness and giddiness: Secondary | ICD-10-CM | POA: Diagnosis not present

## 2016-02-12 DIAGNOSIS — E785 Hyperlipidemia, unspecified: Secondary | ICD-10-CM | POA: Diagnosis not present

## 2016-02-12 DIAGNOSIS — J45901 Unspecified asthma with (acute) exacerbation: Secondary | ICD-10-CM | POA: Diagnosis not present

## 2016-02-12 DIAGNOSIS — M1990 Unspecified osteoarthritis, unspecified site: Secondary | ICD-10-CM | POA: Insufficient documentation

## 2016-02-12 DIAGNOSIS — I10 Essential (primary) hypertension: Secondary | ICD-10-CM | POA: Diagnosis not present

## 2016-02-12 LAB — BASIC METABOLIC PANEL
Anion gap: 8 (ref 5–15)
BUN: 14 mg/dL (ref 6–20)
CHLORIDE: 106 mmol/L (ref 101–111)
CO2: 26 mmol/L (ref 22–32)
Calcium: 9.3 mg/dL (ref 8.9–10.3)
Creatinine, Ser: 0.98 mg/dL (ref 0.44–1.00)
GFR calc non Af Amer: 60 mL/min — ABNORMAL LOW (ref >60)
Glucose, Bld: 109 mg/dL — ABNORMAL HIGH (ref 65–99)
POTASSIUM: 4.1 mmol/L (ref 3.5–5.1)
SODIUM: 140 mmol/L (ref 135–145)

## 2016-02-12 LAB — CBG MONITORING, ED: Glucose-Capillary: 105 mg/dL — ABNORMAL HIGH (ref 65–99)

## 2016-02-12 LAB — CBC
HEMATOCRIT: 39 % (ref 36.0–46.0)
Hemoglobin: 12.4 g/dL (ref 12.0–15.0)
MCH: 26.1 pg (ref 26.0–34.0)
MCHC: 31.8 g/dL (ref 30.0–36.0)
MCV: 81.9 fL (ref 78.0–100.0)
Platelets: 267 10*3/uL (ref 150–400)
RBC: 4.76 MIL/uL (ref 3.87–5.11)
RDW: 15 % (ref 11.5–15.5)
WBC: 7.8 10*3/uL (ref 4.0–10.5)

## 2016-02-12 NOTE — ED Provider Notes (Signed)
[place] DEPT Provider Note   CSN: [id number] Arrival date & time: 02/12/16  1252  First Provider Contact:  First MD Initiated Contact with Patient 02/12/16 1728        History   Chief Complaint Chief Complaint  Patient presents with  . Arm Pain  . Weakness    HPI Brooke [name] is a 65 y.o. female.  The history is provided by the patient.  Arm Pain  This is a new problem. The current episode started more than 1 week ago. The problem occurs daily. The problem has not changed since onset.Pertinent negatives include no chest pain, no headaches and no shortness of breath. Exacerbated by: movement and palpation. The symptoms are relieved by rest.  Weakness  This is a new problem. The current episode started more than 1 week ago. The problem occurs daily. The problem has not changed since onset.Pertinent negatives include no chest pain, no headaches and no shortness of breath. Nothing aggravates the symptoms. Nothing relieves the symptoms.  Patient reports for over 4 weeks she has had pain/numbness to left arm No trauma She has some neck pain She reports her left arm feels weak at times She also reports "lips feeling numb" at times. She reports both sides of lips are numb She reports family members have told her "face looked different" No new visual changes She denies h/o stroke  Past Medical History:  Diagnosis Date  . ALOPECIA 12/30/2007  . ANEMIA-NOS 02/09/2008  . CHEST PAIN 02/28/2009  . COLD SORE 10/01/2007  . COLONIC POLYPS, HX OF 02/26/2007  . Degenerative arthritis of left knee 05/18/2014  . DIABETES MELLITUS, TYPE II 11/29/2008  . ECZEMA, ATOPIC DERMATITIS 09/19/2006  . FATIGUE 09/05/2009  . GERD 02/09/2008  . GOITER NOS 09/19/2006  . GOUT 08/09/2008  . HYPERLIPIDEMIA 10/01/2007  . HYPERTENSION 02/26/2007  . INSOMNIA NOS 09/19/2006  . OTITIS EXTERNA, RIGHT 02/28/2009  . Pain in joint, lower leg 10/01/2007  . SINUSITIS- ACUTE-NOS 05/20/2009  . SKIN LESION 08/09/2008  .  Syncope 11/06/2013  . Unspecified asthma(493.90) 09/19/2006  . Urinary frequency 11/29/2008  . Ventricular tachycardia, polymorphic ([place]) 01/04/2014  . Wheezing 12/30/2009    Patient Active Problem List   Diagnosis Date Noted  . Arthritis of right lower extremity 02/15/2015  . Effusion of right knee 01/28/2015  . Asthma with exacerbation 11/09/2014  . Pronation deformity of both feet 09/29/2014  . Greater trochanteric bursitis of left hip 08/31/2014  . Degenerative arthritis of left knee 05/18/2014  . Ventricular tachycardia, polymorphic ([place]) 01/04/2014  . Abnormal MRI 12/04/2013  . Syncope 11/06/2013  . Chest pain 11/06/2013  . Headache(784.0) 11/06/2013  . Nausea with vomiting 11/06/2013  . Cough 08/28/2013  . Hypokalemia 08/18/2012  . Diastolic dysfunction [id number]  . Leukocytosis 01/03/2011  . Toe pain 12/20/2010  . Preventative health care 12/19/2010  . FATIGUE 09/05/2009  . Diabetes ([place]) 11/29/2008  . Gout 08/09/2008  . ANEMIA-NOS 02/09/2008  . GERD 02/09/2008  . ALOPECIA 12/30/2007  . Hyperlipidemia 10/01/2007  . Essential hypertension 02/26/2007  . COLONIC POLYPS, HX OF 02/26/2007  . GOITER NOS 09/19/2006  . Asthma 09/19/2006  . ECZEMA, ATOPIC DERMATITIS 09/19/2006  . Insomnia 09/19/2006    Past Surgical History:  Procedure Laterality Date  . ABDOMINAL HYSTERECTOMY  10/22/1999   BSO  . CHOLECYSTECTOMY  3.1.2004  . L RAS re-stented -  03/08/2005  . L RAS tx'd w/angioplasty  11/21/2003  . left cataract surgury    . LEFT HEART CATHETERIZATION WITH  CORONARY ANGIOGRAM N/A 01/07/2014   Procedure: LEFT HEART CATHETERIZATION WITH CORONARY ANGIOGRAM;  Surgeon: Peter M Martinique, MD;  Location: [facility] CATH LAB;  Service: Cardiovascular;  Laterality: N/A;  . radioactive iodine ablate  3/03   thyroid 10/07/2001  . [place] PROCEDURE  6/98 neg  . Split sleep study neg for sleep apnea  11/15/2003    OB History    No data available       Home Medications    Prior to  Admission medications   Medication Sig Start Date End Date Taking? Authorizing Provider  albuterol (PROVENTIL HFA;VENTOLIN HFA) 108 (90 BASE) MCG/ACT inhaler Inhale 2 puffs into the lungs every 6 (six) hours as needed for wheezing. 11/09/14   Biagio Borg, MD  allopurinol (ZYLOPRIM) 100 MG tablet Take 1 tablet (100 mg total) by mouth daily. 09/22/15   Lyndal Pulley, DO  amLODipine (NORVASC) 10 MG tablet Take 1 tablet (10 mg total) by mouth daily. 05/11/15   Biagio Borg, MD  amLODipine (NORVASC) 10 MG tablet TAKE ONE TABLET BY MOUTH ONCE DAILY 11/15/15   Biagio Borg, MD  colchicine 0.6 MG tablet Take 1 tablet (0.6 mg total) by mouth daily. 08/18/14   Biagio Borg, MD  fluticasone (FLOVENT HFA) 110 MCG/ACT inhaler Inhale 2 puffs into the lungs 2 (two) times daily. 11/11/14   Biagio Borg, MD  hydrochlorothiazide (HYDRODIURIL) 25 MG tablet Take 1 tablet (25 mg total) by mouth daily. 05/11/15   Biagio Borg, MD  hydrochlorothiazide (HYDRODIURIL) 25 MG tablet Take 1 tablet (25 mg total) by mouth daily. Yearly physical w/labs are due must see md for refills 10/25/15   Biagio Borg, MD  ibuprofen (ADVIL,MOTRIN) 600 MG tablet TAKE ONE TABLET BY MOUTH EVERY 6 HOURS AS NEEDED 01/19/16   Biagio Borg, MD  irbesartan (AVAPRO) 75 MG tablet Take 1 tablet (75 mg total) by mouth daily. 05/11/15   Biagio Borg, MD  metFORMIN (GLUCOPHAGE-XR) 500 MG 24 hr tablet TAKE ONE TABLET BY MOUTH DAILY WITH BREAKFAST 11/15/15   Biagio Borg, MD  metoprolol succinate (TOPROL-XL) 50 MG 24 hr tablet Take 1 tablet (50 mg total) by mouth daily. Please call and schedule an appointment 11/18/15   Sueanne Margarita, MD  montelukast (SINGULAIR) 10 MG tablet Take 10 mg by mouth daily.      Historical Provider, MD  omeprazole (PRILOSEC) 20 MG capsule Take 1 capsule (20 mg total) by mouth 2 (two) times daily. 11/09/14   Biagio Borg, MD  potassium chloride SA (K-DUR,KLOR-CON) 20 MEQ tablet Take 2 tablets (40 mEq total) by mouth daily. 04/14/15   Sueanne Margarita, MD  rosuvastatin (CRESTOR) 20 MG tablet Take 1 tablet (20 mg total) by mouth daily. 08/25/15   Biagio Borg, MD  simvastatin (ZOCOR) 40 MG tablet TAKE ONE TABLET BY MOUTH ONCE DAILY 11/15/15   Biagio Borg, MD  traZODone (DESYREL) 50 MG tablet Take 1 tablet (50 mg total) by mouth at bedtime. 11/09/14   Biagio Borg, MD  zolpidem (AMBIEN) 5 MG tablet Take 1 tablet (5 mg total) by mouth at bedtime as needed for sleep. 05/11/15   Biagio Borg, MD    Family History Family History  Problem Relation Age of Onset  . Diabetes Mother   . Heart disease Father     Social History Social History  Substance Use Topics  . Smoking status: Never Smoker  . Smokeless tobacco: Never Used  .  Alcohol use No     Allergies   Ace inhibitors; Codeine; Hydrocodone; and Tramadol   Review of Systems Review of Systems  Constitutional: Negative for fever.  Respiratory: Negative for shortness of breath.   Cardiovascular: Negative for chest pain.  Neurological: Positive for dizziness, weakness and numbness. Negative for syncope and headaches.  All other systems reviewed and are negative.    Physical Exam Updated Vital Signs BP (!) 157/104 (BP Location: Left Arm)   Pulse 76   Temp 98.1 F (36.7 C) (Oral)   Resp 20   Ht 5\' 3"  (1.6 m)   Wt 80.7 kg   SpO2 100%   BMI 31.53 kg/m   Physical Exam  CONSTITUTIONAL: Well developed/well nourished HEAD: Normocephalic/atraumatic EYES: EOMI, left eye surgically corrected ENMT: Mucous membranes moist NECK: supple no meningeal signs, no bruits CV: S1/S2 noted, no murmurs/rubs/gallops noted LUNGS: Lungs are clear to auscultation bilaterally, no apparent distress ABDOMEN: soft, nontender, no rebound or guarding GU:no cva tenderness NEURO:Awake/alert, face symmetric, no arm or leg drift is noted Equal 5/5 strength with shoulder abduction, elbow flex/extension, wrist flex/extension in upper extremities and equal hand grips bilaterally Equal 5/5  strength with hip flexion,knee flex/extension Cranial nerves /4/5/6/01/28/09/11/12 tested and intact Gait normal without ataxia Sensation to light touch intact in all extremities EXTREMITIES: pulses normal, full ROM, pt with tenderness to palpation of left forearm but no deformities noted.  No erythema/edema noted SKIN: warm, color normal PSYCH: no abnormalities of mood noted  ED Treatments / Results  Labs (all labs ordered are listed, but only abnormal results are displayed) Labs Reviewed  BASIC METABOLIC PANEL - Abnormal; Notable for the following:       Result Value   Glucose, Bld 109 (*)    GFR calc non Af Amer 60 (*)    All other components within normal limits  CBC  CBG MONITORING, ED    EKG  EKG Interpretation  Date/Time:  Sunday February 12 2016 17:41:14 EDT Ventricular Rate:  79 PR Interval:    QRS Duration: 111 QT Interval:  379 QTC Calculation: 435 R Axis:   -35 Text Interpretation:  Sinus rhythm Abnormal R-wave progression, late transition Left ventricular hypertrophy No significant change since last tracing Confirmed by Christy Gentles  MD, Ahna Konkle ([id number]) on 02/12/2016 5:48:18 PM       Radiology Ct Head Wo Contrast  Result Date: 02/12/2016 CLINICAL DATA:  Abnormal sensation in the left arm and lips for 4 weeks. EXAM: CT HEAD WITHOUT CONTRAST TECHNIQUE: Contiguous axial images were obtained from the base of the skull through the vertex without intravenous contrast. COMPARISON:  Brain MRI 01/12/2014. FINDINGS: No evidence of acute intracranial abnormality including hemorrhage, infarct, mass lesion, mass effect, midline shift or abnormal extra-axial fluid collection is identified. No hydrocephalus or pneumocephalus. The calvarium is intact. Abnormality in the pons seen on the prior MRI is not appreciated on this examination. IMPRESSION: Negative head CT. Electronically Signed   By: Inge Rise M.D.   On: 02/12/2016 18:11   Procedures Procedures (including critical care  time)  Medications Ordered in ED Medications - No data to display   Initial Impression / Assessment and Plan / ED Course  I have reviewed the triage vital signs and the nursing notes.  Pertinent labs & imaging results that were available during my care of the patient were reviewed by me and considered in my medical decision making (see chart for details).  Clinical Course  Value Comment By Time  ED EKG (Reviewed)  Ripley Fraise, MD 07/23 1729   Labs unremarkable for acute disease Ripley Fraise, MD 07/23 1749    Pt here for left arm numbness/pain.  She currently has no signs of acute stroke No focal weakness noted She also mentions lip numbness/dizziness previously Will obtain CT head If negative will d/c home with PCP followup  6:38 PM CT head negative Pt well appearing/watching TV I have low suspicion for occult CVA/TIa at this time No gross motor deficits noted Will d/c home with PCP followup Discussed strict return precautions  BP 162/85 (BP Location: Left Arm)   Pulse 85   Temp 97.8 F (36.6 C)   Resp 16   Ht 5\' 3"  (1.6 m)   Wt 80.7 kg   SpO2 98%   BMI 31.53 kg/m   Final Clinical Impressions(s) / ED Diagnoses   Final diagnoses:  Left arm pain  Dizziness    New Prescriptions New Prescriptions   No medications on file     Ripley Fraise, MD 02/12/16 1839

## 2016-02-12 NOTE — ED Triage Notes (Addendum)
Patient reports left arm pain and lips "feeling funny" x 4 weeks.  Reports numbness and tingling to left arm as well as intermittent periods of weakness. Denies chest pain, shortness of breath, dizziness, weakness at present.  Patient ambulatory and in NAD.

## 2016-02-12 NOTE — Discharge Instructions (Signed)

## 2016-02-13 ENCOUNTER — Other Ambulatory Visit: Payer: Self-pay | Admitting: Internal Medicine

## 2016-02-20 ENCOUNTER — Other Ambulatory Visit: Payer: Self-pay | Admitting: Internal Medicine

## 2016-03-22 ENCOUNTER — Other Ambulatory Visit: Payer: Self-pay | Admitting: Internal Medicine

## 2016-04-06 ENCOUNTER — Other Ambulatory Visit: Payer: Self-pay | Admitting: Cardiology

## 2016-04-06 ENCOUNTER — Other Ambulatory Visit: Payer: Self-pay | Admitting: Internal Medicine

## 2016-04-06 ENCOUNTER — Other Ambulatory Visit: Payer: Self-pay | Admitting: *Deleted

## 2016-04-06 MED ORDER — METOPROLOL SUCCINATE ER 50 MG PO TB24: 50.0000 mg | ORAL_TABLET | Freq: Every day | ORAL | 3 refills | Status: DC

## 2016-04-20 ENCOUNTER — Other Ambulatory Visit: Payer: Self-pay | Admitting: Internal Medicine

## 2016-04-20 ENCOUNTER — Other Ambulatory Visit: Payer: Self-pay | Admitting: Family Medicine

## 2016-04-20 DIAGNOSIS — M1000 Idiopathic gout, unspecified site: Secondary | ICD-10-CM

## 2016-04-23 ENCOUNTER — Telehealth: Payer: Self-pay | Admitting: Internal Medicine

## 2016-04-23 NOTE — Telephone Encounter (Signed)
Refill done.  

## 2016-04-23 NOTE — Telephone Encounter (Signed)
Pt called in said that she does not have ins right now and doesn't have the money to come in .  She wants to know if her meds could be call in.  Her last visit was 10/19

## 2016-04-24 NOTE — Telephone Encounter (Signed)
Ok for one mo routine med refills  Pt needs ROV due to HTN, DM

## 2016-05-01 MED ORDER — HYDROCHLOROTHIAZIDE 25 MG PO TABS: ORAL_TABLET | ORAL | 0 refills | Status: DC

## 2016-05-01 MED ORDER — IBUPROFEN 600 MG PO TABS: 600.0000 mg | ORAL_TABLET | Freq: Four times a day (QID) | ORAL | 0 refills | Status: DC | PRN

## 2016-05-01 NOTE — Addendum Note (Signed)
Addended by: Earnstine Regal on: 05/01/2016 02:03 PM   Modules accepted: Orders

## 2016-05-31 ENCOUNTER — Other Ambulatory Visit: Payer: Self-pay | Admitting: Internal Medicine

## 2016-05-31 ENCOUNTER — Other Ambulatory Visit: Payer: Self-pay | Admitting: Family Medicine

## 2016-05-31 DIAGNOSIS — M1000 Idiopathic gout, unspecified site: Secondary | ICD-10-CM

## 2016-05-31 NOTE — Telephone Encounter (Signed)
Pt has not been seen in over a year. Refill denied.  

## 2016-07-24 ENCOUNTER — Other Ambulatory Visit: Payer: Self-pay | Admitting: Internal Medicine

## 2016-07-24 ENCOUNTER — Other Ambulatory Visit: Payer: Self-pay | Admitting: Family Medicine

## 2016-07-24 ENCOUNTER — Telehealth: Payer: Self-pay | Admitting: *Deleted

## 2016-07-24 DIAGNOSIS — M1000 Idiopathic gout, unspecified site: Secondary | ICD-10-CM

## 2016-07-24 NOTE — Telephone Encounter (Signed)
Rec'd call pt states she want have insurance until next month wanting to get a refill sent on her Allopurinol & Ibuprofen. Inform she is overdue for her yearly last ov 05/11/15 will have to see MD for renewal.../lmb

## 2016-07-24 NOTE — Telephone Encounter (Signed)
Refill denied. Pt needs an appt. 

## 2016-08-28 ENCOUNTER — Ambulatory Visit: Payer: Self-pay | Admitting: Internal Medicine

## 2016-09-12 ENCOUNTER — Ambulatory Visit (INDEPENDENT_AMBULATORY_CARE_PROVIDER_SITE_OTHER): Payer: Self-pay | Admitting: Internal Medicine

## 2016-09-12 ENCOUNTER — Encounter: Payer: Self-pay | Admitting: Internal Medicine

## 2016-09-12 VITALS — BP 152/86 | HR 98 | Temp 97.6°F | Ht 63.0 in | Wt 172.0 lb

## 2016-09-12 DIAGNOSIS — R300 Dysuria: Secondary | ICD-10-CM | POA: Insufficient documentation

## 2016-09-12 DIAGNOSIS — M1000 Idiopathic gout, unspecified site: Secondary | ICD-10-CM

## 2016-09-12 DIAGNOSIS — Z23 Encounter for immunization: Secondary | ICD-10-CM

## 2016-09-12 DIAGNOSIS — E119 Type 2 diabetes mellitus without complications: Secondary | ICD-10-CM

## 2016-09-12 DIAGNOSIS — I10 Essential (primary) hypertension: Secondary | ICD-10-CM

## 2016-09-12 LAB — POCT URINALYSIS DIPSTICK
Bilirubin, UA: NEGATIVE
Blood, UA: NEGATIVE
GLUCOSE UA: NEGATIVE
Ketones, UA: NEGATIVE
NITRITE UA: POSITIVE
PROTEIN UA: 15
Spec Grav, UA: 1.025
UROBILINOGEN UA: 0.2
pH, UA: 6

## 2016-09-12 MED ORDER — IRBESARTAN 75 MG PO TABS: 75.0000 mg | ORAL_TABLET | Freq: Every day | ORAL | 11 refills | Status: DC

## 2016-09-12 MED ORDER — METFORMIN HCL ER 500 MG PO TB24: ORAL_TABLET | ORAL | 11 refills | Status: DC

## 2016-09-12 MED ORDER — METOPROLOL SUCCINATE ER 50 MG PO TB24: 50.0000 mg | ORAL_TABLET | Freq: Every day | ORAL | 11 refills | Status: DC

## 2016-09-12 MED ORDER — ALLOPURINOL 100 MG PO TABS: 100.0000 mg | ORAL_TABLET | Freq: Every day | ORAL | 11 refills | Status: DC

## 2016-09-12 MED ORDER — FLUTICASONE PROPIONATE HFA 110 MCG/ACT IN AERO: 2.0000 | INHALATION_SPRAY | Freq: Two times a day (BID) | RESPIRATORY_TRACT | 12 refills | Status: DC

## 2016-09-12 MED ORDER — OMEPRAZOLE 20 MG PO CPDR: 20.0000 mg | DELAYED_RELEASE_CAPSULE | Freq: Two times a day (BID) | ORAL | 11 refills | Status: DC

## 2016-09-12 MED ORDER — ALBUTEROL SULFATE HFA 108 (90 BASE) MCG/ACT IN AERS: 2.0000 | INHALATION_SPRAY | Freq: Four times a day (QID) | RESPIRATORY_TRACT | 11 refills | Status: DC | PRN

## 2016-09-12 MED ORDER — COLCHICINE 0.6 MG PO TABS: 0.6000 mg | ORAL_TABLET | Freq: Every day | ORAL | 11 refills | Status: DC

## 2016-09-12 MED ORDER — POTASSIUM CHLORIDE CRYS ER 20 MEQ PO TBCR: 20.0000 meq | EXTENDED_RELEASE_TABLET | Freq: Two times a day (BID) | ORAL | 11 refills | Status: DC

## 2016-09-12 MED ORDER — HYDROCHLOROTHIAZIDE 25 MG PO TABS: 25.0000 mg | ORAL_TABLET | Freq: Every day | ORAL | 11 refills | Status: DC

## 2016-09-12 MED ORDER — AMLODIPINE BESYLATE 10 MG PO TABS: 10.0000 mg | ORAL_TABLET | Freq: Every day | ORAL | 3 refills | Status: DC

## 2016-09-12 MED ORDER — IBUPROFEN 600 MG PO TABS: ORAL_TABLET | ORAL | 2 refills | Status: DC

## 2016-09-12 MED ORDER — CEPHALEXIN 500 MG PO CAPS: 500.0000 mg | ORAL_CAPSULE | Freq: Three times a day (TID) | ORAL | 0 refills | Status: AC

## 2016-09-12 MED ORDER — SIMVASTATIN 40 MG PO TABS: 40.0000 mg | ORAL_TABLET | Freq: Every day | ORAL | 11 refills | Status: DC

## 2016-09-12 NOTE — Assessment & Plan Note (Signed)
stable overall by history and exam, recent data reviewed with pt, and pt to continue medical treatment as before,  to f/u any worsening symptoms or concerns Lab Results  Component Value Date   HGBA1C 5.9 05/11/2015   To restart meds

## 2016-09-12 NOTE — Assessment & Plan Note (Signed)
Brooke [name] for restart allopuriinol and nsaid prn, no current symptoms

## 2016-09-12 NOTE — Progress Notes (Addendum)
Subjective:    Patient ID: Brooke [name], female    DOB: [birth year], 66 y.o.   MRN: [id number]  HPI  Here to f/u; does not have insurance and was trying to hold off on coming in, out of all meds for several months, but did have nosebleed and noted BP at home with sbp 190's, so decided to not wait further, asks for all med refills printed and she will obtain as she has funds, starting the BP meds today.  Pt denies chest pain, increased sob or doe, wheezing, orthopnea, PND, increased LE swelling, palpitations, dizziness or syncope. Pt denies new neurological symptoms such as new headache, or facial or extremity weakness or numbness   Pt denies polydipsia, polyuria, Does also c/o mild urinary symptoms such as dysuria, frequency, and smell, but no urgency, flank pain, hematuria or n/v, fever, chills.  Plans to f/u after 66th Bday as she will have [place] at that point, does not want extensive evaluation today Past Medical History:  Diagnosis Date  . ALOPECIA 12/30/2007  . ANEMIA-NOS 02/09/2008  . CHEST PAIN 02/28/2009  . COLD SORE 10/01/2007  . COLONIC POLYPS, HX OF 02/26/2007  . Degenerative arthritis of left knee 05/18/2014  . DIABETES MELLITUS, TYPE II 11/29/2008  . ECZEMA, ATOPIC DERMATITIS 09/19/2006  . FATIGUE 09/05/2009  . GERD 02/09/2008  . GOITER NOS 09/19/2006  . GOUT 08/09/2008  . HYPERLIPIDEMIA 10/01/2007  . HYPERTENSION 02/26/2007  . INSOMNIA NOS 09/19/2006  . OTITIS EXTERNA, RIGHT 02/28/2009  . Pain in joint, lower leg 10/01/2007  . SINUSITIS- ACUTE-NOS 05/20/2009  . SKIN LESION 08/09/2008  . Syncope 11/06/2013  . Unspecified asthma(493.90) 09/19/2006  . Urinary frequency 11/29/2008  . Ventricular tachycardia, polymorphic ([place]) 01/04/2014  . Wheezing 12/30/2009   Past Surgical History:  Procedure Laterality Date  . ABDOMINAL HYSTERECTOMY  10/22/1999   BSO  . CHOLECYSTECTOMY  3.1.2004  . L RAS re-stented -  03/08/2005  . L RAS tx'd w/angioplasty  11/21/2003  . left cataract surgury    . LEFT  HEART CATHETERIZATION WITH CORONARY ANGIOGRAM N/A 01/07/2014   Procedure: LEFT HEART CATHETERIZATION WITH CORONARY ANGIOGRAM;  Surgeon: Peter M Martinique, MD;  Location[facility] CATH LAB;  Service: Cardiovascular;  Laterality: N/A;  . radioactive iodine ablate  3/03   thyroid 10/07/2001  . [place] PROCEDURE  6/98 neg  . Split sleep study neg for sleep apnea  11/15/2003    reports that she has never smoked. She has never used smokeless tobacco. She reports that she does not drink alcohol or use drugs. family history includes Diabetes in her mother; Heart disease in her father. Allergies  Allergen Reactions  . Ace Inhibitors Other (See Comments)    Reaction:  Unknown   . Codeine Nausea And Vomiting and Rash  . Hydrocodone Nausea And Vomiting  . Tramadol Nausea And Vomiting   Current Outpatient Prescriptions on File Prior to Visit  Medication Sig Dispense Refill  . albuterol (PROVENTIL HFA;VENTOLIN HFA) 108 (90 BASE) MCG/ACT inhaler Inhale 2 puffs into the lungs every 6 (six) hours as needed for wheezing. 1 Inhaler 11  . allopurinol (ZYLOPRIM) 100 MG tablet Take 1 tablet (100 mg total) by mouth daily. OFFICE VISIT FOR FUTURE REFILLS 30 tablet 0  . amLODipine (NORVASC) 10 MG tablet Take 1 tablet (10 mg total) by mouth daily. 90 tablet 3  . colchicine 0.6 MG tablet Take 1 tablet (0.6 mg total) by mouth daily. 90 tablet 3  . FLOVENT HFA 110 MCG/ACT inhaler  INHALE TWO PUFFS BY MOUTH TWICE DAILY 12 g 12  . hydrochlorothiazide (HYDRODIURIL) 25 MG tablet Take 1 tablet (25 mg total) by mouth daily. 90 tablet 3  . ibuprofen (ADVIL,MOTRIN) 600 MG tablet Take 600 mg by mouth every 6 (six) hours as needed for headache, mild pain or moderate pain.    . metFORMIN (GLUCOPHAGE-XR) 500 MG 24 hr tablet Take 500 mg by mouth daily with breakfast.    . metoprolol succinate (TOPROL-XL) 50 MG 24 hr tablet Take 1 tablet (50 mg total) by mouth daily. Take with or immediately following a meal. 30 tablet 3  . omeprazole  (PRILOSEC) 20 MG capsule Take 1 capsule (20 mg total) by mouth 2 (two) times daily. 180 capsule 3  . potassium chloride SA (K-DUR,KLOR-CON) 20 MEQ tablet Take 20 mEq by mouth 2 (two) times daily.    . simvastatin (ZOCOR) 40 MG tablet Take 40 mg by mouth at bedtime.    Marland Kitchen zolpidem (AMBIEN) 5 MG tablet Take 1 tablet (5 mg total) by mouth at bedtime as needed for sleep. 90 tablet 1   No current facility-administered medications on file prior to visit.    Review of Systems All otherwise neg per pt     Objective:   Physical Exam BP (!) 152/86   Pulse 98   Temp 97.6 F (36.4 C)   Ht 5\' 3"  (1.6 m)   Wt 172 lb (78 kg)   SpO2 99%   BMI 30.47 kg/m  VS noted,  Constitutional: Pt appears in no apparent distress HENT: Head: NCAT.  Right Ear: External ear normal.  Left Ear: External ear normal.  Eyes: . Pupils are equal, round, and reactive to light. Conjunctivae and EOM are normal Neck: Normal range of motion. Neck supple.  Cardiovascular: Normal rate and regular rhythm.   Pulmonary/Chest: Effort normal and breath sounds without rales or wheezing.  Abd:  Soft, NT, ND, + BS Neurological: Pt is alert. Not confused , motor grossly intact Skin: Skin is warm. No rash, no LE edema Psychiatric: Pt behavior is normal. No agitation.    Lab Results  Component Value Date   WBC 7.8 02/12/2016   HGB 12.4 02/12/2016   HCT 39.0 02/12/2016   PLT 267 02/12/2016   GLUCOSE 109 (H) 02/12/2016   CHOL 154 05/11/2015   TRIG 77.0 05/11/2015   HDL 49.30 05/11/2015   LDLDIRECT 139.1 09/05/2009   LDLCALC 90 05/11/2015   ALT 6 05/11/2015   AST 12 05/11/2015   NA 140 02/12/2016   K 4.1 02/12/2016   CL 106 02/12/2016   CREATININE 0.98 02/12/2016   BUN 14 02/12/2016   CO2 26 02/12/2016   TSH 2.11 04/05/2015   INR 0.95 01/07/2014   HGBA1C 5.9 05/11/2015   MICROALBUR 1.0 11/09/2014   POCT Urinalysis Dipstick  Order: [id number]  Status:  Final result Visible to patient:  No (Not Released) Dx:   Dysuria    Ref Range & Units 09:54 53yr ago 23yr ago   Color, UA  yellow      Clarity, UA  cloudy      Glucose, UA  neg      Bilirubin, UA  neg      Ketones, UA  neg      Spec Grav, UA  1.025      Blood, UA  neg      pH, UA  6.0      Protein, UA  15      Urobilinogen, UA  0.2  0.2  0.2    Nitrite, UA  pos      Leukocytes, UA Negative 4+   SMALL   SMALL             Assessment & Plan:

## 2016-09-12 NOTE — Patient Instructions (Addendum)
You had the flu shot today  Please take all new medication as prescribed - the antibiotic  Please continue all other medications as before, and refills have been done if requested to restart your medications  Please have the pharmacy call with any other refills you may need.  Please continue your efforts at being more active, low cholesterol diet, and weight control.  Please keep your appointments with your specialists as you may have planned  Please return in 4 months, or sooner if needed

## 2016-09-12 NOTE — Addendum Note (Signed)
Addended by: Elta Guadeloupe on: 09/12/2016 09:56 AM   Modules accepted: Orders

## 2016-09-12 NOTE — Assessment & Plan Note (Signed)
Mild uncontrolled, for restart meds, f/u BP at home, and next visit

## 2016-09-12 NOTE — Assessment & Plan Note (Signed)
Udip + for prob cystitis, exam benign but will need empiric tx - for cephalexin asd,  to f/u any worsening symptoms or concerns

## 2016-12-02 DIAGNOSIS — R0602 Shortness of breath: Secondary | ICD-10-CM | POA: Insufficient documentation

## 2016-12-02 NOTE — Progress Notes (Signed)
Cardiology Office Note    Date:  12/03/2016   ID:  Brooke [name], DOB 01-Nov-1950, MRN [id number]  PCP:  Biagio Borg, MD  Cardiologist:  Fransico Him, MD   Chief Complaint  Patient presents with  . Follow-up    HTN, VT, lipids    History of Present Illness:  Brooke [name] is a 67 y.o. female with a history of HTN, dyslipdemia, DM and syncope. She also has a history of polymorphic VT with a normal cardiac MRI with normal EF 73%. Lexiscan myoview showed no ischemia. She subsequently underwent left heart cath which showed normal coronary arteries and normal LVF. She was started on Toprol and is followed by Dr. Caryl Comes for her polymorphic VT that was felt to be triggered by PVC's.  She is here today for followup.  She has been having problems with sharp CP that is located over her left breast and had a lump over her sternum which was painful to touch but not like the pain she feels inside her chest.  She says that her husband is total care and she has to lift her husband in and out of bed. There is no radiation of the pain.  She does get SOB with the pain.  She denies any diaphoresis or nausea with the pain.   She denies any  LE edema, palpitations or syncope. She has some vertigo a few weeks ago.      Past Medical History:  Diagnosis Date  . ALOPECIA 12/30/2007  . ANEMIA-NOS 02/09/2008  . CHEST PAIN 02/28/2009  . COLD SORE 10/01/2007  . COLONIC POLYPS, HX OF 02/26/2007  . Degenerative arthritis of left knee 05/18/2014  . DIABETES MELLITUS, TYPE II 11/29/2008  . ECZEMA, ATOPIC DERMATITIS 09/19/2006  . FATIGUE 09/05/2009  . GERD 02/09/2008  . GOITER NOS 09/19/2006  . GOUT 08/09/2008  . HYPERLIPIDEMIA 10/01/2007  . HYPERTENSION 02/26/2007  . INSOMNIA NOS 09/19/2006  . OTITIS EXTERNA, RIGHT 02/28/2009  . Pain in joint, lower leg 10/01/2007  . SINUSITIS- ACUTE-NOS 05/20/2009  . SKIN LESION 08/09/2008  . Syncope 11/06/2013  . Unspecified asthma(493.90) 09/19/2006  . Urinary frequency 11/29/2008    . Ventricular tachycardia, polymorphic ([place]) 01/04/2014  . Wheezing 12/30/2009    Past Surgical History:  Procedure Laterality Date  . ABDOMINAL HYSTERECTOMY  10/22/1999   BSO  . CHOLECYSTECTOMY  3.1.2004  . L RAS re-stented -  03/08/2005  . L RAS tx'd w/angioplasty  11/21/2003  . left cataract surgury    . LEFT HEART CATHETERIZATION WITH CORONARY ANGIOGRAM N/A 01/07/2014   Procedure: LEFT HEART CATHETERIZATION WITH CORONARY ANGIOGRAM;  Surgeon: Peter M Martinique, MD;  Location: [facility] CATH LAB;  Service: Cardiovascular;  Laterality: N/A;  . radioactive iodine ablate  3/03   thyroid 10/07/2001  . [place] PROCEDURE  6/98 neg  . Split sleep study neg for sleep apnea  11/15/2003    Current Medications: Current Meds  Medication Sig  . albuterol (PROVENTIL HFA;VENTOLIN HFA) 108 (90 Base) MCG/ACT inhaler Inhale 2 puffs into the lungs every 6 (six) hours as needed for wheezing.  Marland Kitchen allopurinol (ZYLOPRIM) 100 MG tablet Take 1 tablet (100 mg total) by mouth daily.  Marland Kitchen amLODipine (NORVASC) 10 MG tablet Take 1 tablet (10 mg total) by mouth daily.  . colchicine 0.6 MG tablet Take 1 tablet (0.6 mg total) by mouth daily.  . fluticasone (FLOVENT HFA) 110 MCG/ACT inhaler Inhale 2 puffs into the lungs 2 (two) times daily.  . hydrochlorothiazide (HYDRODIURIL) 25  MG tablet Take 1 tablet (25 mg total) by mouth daily.  Marland Kitchen ibuprofen (ADVIL,MOTRIN) 600 MG tablet Take 600 mg by mouth every 6 (six) hours as needed for headache, mild pain or moderate pain.  . metFORMIN (GLUCOPHAGE-XR) 500 MG 24 hr tablet TAKE ONE TABLET BY MOUTH ONCE DAILY WITH  BREAKFAST  . metoprolol succinate (TOPROL-XL) 50 MG 24 hr tablet Take 1 tablet (50 mg total) by mouth daily. Take with or immediately following a meal.  . omeprazole (PRILOSEC) 20 MG capsule Take 1 capsule (20 mg total) by mouth 2 (two) times daily.  . potassium chloride SA (K-DUR,KLOR-CON) 20 MEQ tablet Take 1 tablet (20 mEq total) by mouth 2 (two) times daily.  . simvastatin  (ZOCOR) 40 MG tablet Take 1 tablet (40 mg total) by mouth at bedtime.  Marland Kitchen zolpidem (AMBIEN) 5 MG tablet Take 1 tablet (5 mg total) by mouth at bedtime as needed for sleep.    Allergies:   Ace inhibitors; Codeine; Hydrocodone; and Tramadol   Social History   Social History  . Marital status: Married    Spouse name: N/A  . Number of children: 2  . Years of education: N/A   Occupational History  . CNA [employer]   Social History Main Topics  . Smoking status: Never Smoker  . Smokeless tobacco: Never Used  . Alcohol use No  . Drug use: No  . Sexual activity: Not Asked   Other Topics Concern  . None   Social History Narrative  . None     Family History:  The patient's family history includes Diabetes in her mother; Heart disease in her father.   ROS:   Please see the history of present illness.    ROS All other systems reviewed and are negative.  No flowsheet data found.     PHYSICAL EXAM:   VS:  BP (!) 152/82   Pulse 85   Ht 5\' 3"  (1.6 m)   Wt 168 lb (76.2 kg)   SpO2 95%   BMI 29.76 kg/m    GEN: Well nourished, well developed, in no acute distress  HEENT: normal  Neck: no JVD, carotid bruits, or masses Cardiac: RRR; no murmurs, rubs, or gallops,no edema.  Intact distal pulses bilaterally.  Respiratory:  clear to auscultation bilaterally, normal work of breathing GI: soft, nontender, nondistended, + BS MS: no deformity or atrophy  Skin: warm and dry, no rash Neuro:  Alert and Oriented x 3, Strength and sensation are intact Psych: euthymic mood, full affect  Wt Readings from Last 3 Encounters:  12/03/16 168 lb (76.2 kg)  09/12/16 172 lb (78 kg)  02/12/16 178 lb (80.7 kg)      Studies/Labs Reviewed:   EKG:  EKG is not ordered today.   Recent Labs: 02/12/2016: BUN 14; Creatinine, Ser 0.98; Hemoglobin 12.4; Platelets 267; Potassium 4.1; Sodium 140   Lipid Panel    Component Value Date/Time   CHOL 154 05/11/2015 1129   TRIG 77.0 05/11/2015 1129    HDL 49.30 05/11/2015 1129   CHOLHDL 3 05/11/2015 1129   VLDL 15.4 05/11/2015 1129   LDLCALC 90 05/11/2015 1129   LDLDIRECT 139.1 09/05/2009 1052    Additional studies/ records that were reviewed today include:  none    ASSESSMENT:    1. Ventricular tachycardia, polymorphic ([place])   2. Essential hypertension   3. Syncope, unspecified syncope type   4. Other chest pain      PLAN:  In order of problems listed  above  1.  Polymorphic VT secondary to PVC's with normal cardiac MRI/2D echo and cardiac cath.Currently on medical therapy per Dr. Caryl Comes.   She will continue on BB therapy.   2.  HTN -  borderline controlled.She will continue amlodipine/BB/HCT.  I will increase her Toprol to 75mg  daily and have her F/U in HTN clinic in 1 week.   3.  Syncope secondary to #1 with no reoccurence  4.  Chest pain that sounds atypical in that it occurs mainly when she is lifting her husband who is full care but also can occur when she is sitting.  She had a cath 3 years ago showing normal coronary arteries so this CP is likely musculoskeletal and no cardiac.  I will set her up for an ETT to rule out ischemia.      Medication Adjustments/Labs and Tests Ordered: Current medicines are reviewed at length with the patient today.  Concerns regarding medicines are outlined above.  Medication changes, Labs and Tests ordered today are listed in the Patient Instructions below.  There are no Patient Instructions on file for this visit.   Signed, Fransico Him, MD  12/03/2016 10:11 AM    [facility] [address], [place], [place]  [address] Phone: [phone]; Fax: [phone]

## 2016-12-03 ENCOUNTER — Encounter: Payer: Self-pay | Admitting: Cardiology

## 2016-12-03 ENCOUNTER — Ambulatory Visit (INDEPENDENT_AMBULATORY_CARE_PROVIDER_SITE_OTHER): Payer: Medicare Other | Admitting: Cardiology

## 2016-12-03 ENCOUNTER — Encounter (INDEPENDENT_AMBULATORY_CARE_PROVIDER_SITE_OTHER): Payer: Self-pay

## 2016-12-03 VITALS — BP 152/82 | HR 85 | Ht 63.0 in | Wt 168.0 lb

## 2016-12-03 DIAGNOSIS — I4729 Other ventricular tachycardia: Secondary | ICD-10-CM

## 2016-12-03 DIAGNOSIS — R55 Syncope and collapse: Secondary | ICD-10-CM

## 2016-12-03 DIAGNOSIS — R0789 Other chest pain: Secondary | ICD-10-CM

## 2016-12-03 DIAGNOSIS — I472 Ventricular tachycardia: Secondary | ICD-10-CM | POA: Diagnosis not present

## 2016-12-03 DIAGNOSIS — I10 Essential (primary) hypertension: Secondary | ICD-10-CM | POA: Diagnosis not present

## 2016-12-03 MED ORDER — METOPROLOL SUCCINATE ER 50 MG PO TB24: ORAL_TABLET | ORAL | 3 refills | Status: DC

## 2016-12-03 NOTE — Patient Instructions (Signed)
Medication Instructions:  1) INCREASE TOPROL to 75 mg daily (this will be 1.5 tablets)  Labwork: NOne  Testing/Procedures: Your physician has requested that you have an exercise tolerance test. For further information please visit [email or web address]. Please also follow instruction sheet, as given.  Follow-Up: Your physician recommends that you schedule a follow-up appointment in 2 weeks in the HTN CLINIC.  Your physician wants you to follow-up in 1 year with Dr. Radford Pax. You will receive a reminder letter in the mail two months in advance. If you don't receive a letter, please call our office to schedule the follow-up appointment.   Any Other Special Instructions Will Be Listed Below (If Applicable).     If you need a refill on your cardiac medications before your next appointment, please call your pharmacy.

## 2016-12-04 DIAGNOSIS — E119 Type 2 diabetes mellitus without complications: Secondary | ICD-10-CM | POA: Diagnosis not present

## 2016-12-18 NOTE — Progress Notes (Signed)
Patient ID: Brooke [name]                 DOB: 10-05-1950                      MRN: [id number]     HPI: Brooke [name] is a 66 y.o. female patient of Dr. Radford Pax who presents today for hypertension evaluation.  PMH includes HTN, dyslipdemia, DM and syncope. She also has a history of polymorphic VT with a normal cardiac MRI with normal EF 73%. Lexiscan myoview showed no ischemia. She subsequently underwent left heart cath which showed normal coronary arteries and normal LVF.At her most recent OV with Dr. Radford Pax her metoprolol was increased to 75mg  daily as she was hypertensive at that visit.   She presents today for HTN visit and treadmill test. She states she has not taken any of her medications this morning due to testing. She has been using albuterol on a daily basis lately. She also endorses regular daily ibuprofen use. She also states she is under a lot of stress at home as the care giver of her husband. She states she has told him that she can no longer be the sole provider, but he refuses to get extra help. She reports today that she can tell when her pressures is higher due to stress.   Current HTN meds:  Metoprolol succinate 75mg  daily - started about 1 week ago  Hydrochlorothiazide 25mg  daily in the morning Amlodipine 10mg  daily in the morning  BP goal: <130/80  Family History: Diabetes in her mother; Heart disease in her father.  Social History: Denies tobacco and alcohol products.   Diet: Most meals from home. She avoids salt. No coffee. She drinks orange sodas regularly, but otherwise avoids caffeine.   Exercise: No formal exercise due to being too tired.   Home BP readings: 138/80 this morning.   Wt Readings from Last 3 Encounters:  12/19/16 166 lb 8 oz (75.5 kg)  12/03/16 168 lb (76.2 kg)  09/12/16 172 lb (78 kg)   BP Readings from Last 3 Encounters:  12/19/16 (!) 152/90  12/03/16 (!) 152/82  09/12/16 (!) 152/86   Pulse Readings from Last 3 Encounters:  12/19/16  75  12/03/16 85  09/12/16 98    Renal function: CrCl cannot be calculated (Patient's most recent lab result is older than the maximum 21 days allowed.).  Past Medical History:  Diagnosis Date  . ALOPECIA 12/30/2007  . ANEMIA-NOS 02/09/2008  . CHEST PAIN 02/28/2009  . COLD SORE 10/01/2007  . COLONIC POLYPS, HX OF 02/26/2007  . Degenerative arthritis of left knee 05/18/2014  . DIABETES MELLITUS, TYPE II 11/29/2008  . ECZEMA, ATOPIC DERMATITIS 09/19/2006  . FATIGUE 09/05/2009  . GERD 02/09/2008  . GOITER NOS 09/19/2006  . GOUT 08/09/2008  . HYPERLIPIDEMIA 10/01/2007  . HYPERTENSION 02/26/2007  . INSOMNIA NOS 09/19/2006  . OTITIS EXTERNA, RIGHT 02/28/2009  . Pain in joint, lower leg 10/01/2007  . SINUSITIS- ACUTE-NOS 05/20/2009  . SKIN LESION 08/09/2008  . Syncope 11/06/2013  . Unspecified asthma(493.90) 09/19/2006  . Urinary frequency 11/29/2008  . Ventricular tachycardia, polymorphic ([place]) 01/04/2014  . Wheezing 12/30/2009    Current Outpatient Prescriptions on File Prior to Visit  Medication Sig Dispense Refill  . albuterol (PROVENTIL HFA;VENTOLIN HFA) 108 (90 Base) MCG/ACT inhaler Inhale 2 puffs into the lungs every 6 (six) hours as needed for wheezing. 1 Inhaler 11  . allopurinol (ZYLOPRIM) 100 MG tablet Take 1 tablet (  100 mg total) by mouth daily. 30 tablet 11  . amLODipine (NORVASC) 10 MG tablet Take 1 tablet (10 mg total) by mouth daily. 90 tablet 3  . colchicine 0.6 MG tablet Take 1 tablet (0.6 mg total) by mouth daily. 30 tablet 11  . fluticasone (FLOVENT HFA) 110 MCG/ACT inhaler Inhale 2 puffs into the lungs 2 (two) times daily. 12 g 12  . hydrochlorothiazide (HYDRODIURIL) 25 MG tablet Take 1 tablet (25 mg total) by mouth daily. 30 tablet 11  . ibuprofen (ADVIL,MOTRIN) 600 MG tablet Take 600 mg by mouth every 6 (six) hours as needed for headache, mild pain or moderate pain.    . metFORMIN (GLUCOPHAGE-XR) 500 MG 24 hr tablet TAKE ONE TABLET BY MOUTH ONCE DAILY WITH  BREAKFAST 30 tablet 11  .  metoprolol succinate (TOPROL-XL) 50 MG 24 hr tablet Take 75 mg (1.5 tablets) daily. 135 tablet 3  . omeprazole (PRILOSEC) 20 MG capsule Take 1 capsule (20 mg total) by mouth 2 (two) times daily. 60 capsule 11  . potassium chloride SA (K-DUR,KLOR-CON) 20 MEQ tablet Take 1 tablet (20 mEq total) by mouth 2 (two) times daily. 60 tablet 11  . simvastatin (ZOCOR) 40 MG tablet Take 1 tablet (40 mg total) by mouth at bedtime. 30 tablet 11  . zolpidem (AMBIEN) 5 MG tablet Take 1 tablet (5 mg total) by mouth at bedtime as needed for sleep. 90 tablet 1   No current facility-administered medications on file prior to visit.     Allergies  Allergen Reactions  . Ace Inhibitors Other (See Comments)    Reaction:  Unknown   . Codeine Nausea And Vomiting and Rash  . Hydrocodone Nausea And Vomiting  . Tramadol Nausea And Vomiting    Blood pressure (!) 152/90, pulse 75, weight 166 lb 8 oz (75.5 kg).   Assessment/Plan: Hypertension: BP not at goal today, which could be secondary to missed doses of medicine this morning for testing. Her home pressures have been better per patient report. Will have her remain on current doses and return with home log in 2 weeks for additional titration of meds if appropriate.    Thank you, Lelan Pons. Patterson Hammersmith, [facility]  12/19/2016 11:16 AM

## 2016-12-19 ENCOUNTER — Other Ambulatory Visit: Payer: Self-pay | Admitting: Internal Medicine

## 2016-12-19 ENCOUNTER — Ambulatory Visit: Payer: Medicare Other

## 2016-12-19 ENCOUNTER — Ambulatory Visit (INDEPENDENT_AMBULATORY_CARE_PROVIDER_SITE_OTHER): Payer: Medicare Other | Admitting: Pharmacist

## 2016-12-19 ENCOUNTER — Telehealth: Payer: Self-pay

## 2016-12-19 VITALS — BP 152/90 | HR 75 | Wt 166.5 lb

## 2016-12-19 DIAGNOSIS — I10 Essential (primary) hypertension: Secondary | ICD-10-CM | POA: Diagnosis not present

## 2016-12-19 DIAGNOSIS — R079 Chest pain, unspecified: Secondary | ICD-10-CM

## 2016-12-19 NOTE — Telephone Encounter (Signed)
Patient in today for GXT. Patient unable to walk on treadmill. Lexiscan ordered for scheduling. Patient agrees with treatment plan.

## 2016-12-19 NOTE — Patient Instructions (Signed)
Return for a follow up appointment in 2-3 weeks  Check your blood pressure at home daily (if able) and keep record of the readings.  Take your BP meds as follows: CONTINUE all medications as prescribed  Bring all of your meds, your BP cuff and your record of home blood pressures to your next appointment.  Exercise as you're able, try to walk approximately 30 minutes per day.  Keep salt intake to a minimum, especially watch canned and prepared boxed foods.  Eat more fresh fruits and vegetables and fewer canned items.  Avoid eating in fast food restaurants.    HOW TO TAKE YOUR BLOOD PRESSURE: . Rest 5 minutes before taking your blood pressure. .  Don't smoke or drink caffeinated beverages for at least 30 minutes before. . Take your blood pressure before (not after) you eat. . Sit comfortably with your back supported and both feet on the floor (don't cross your legs). . Elevate your arm to heart level on a table or a desk. . Use the proper sized cuff. It should fit smoothly and snugly around your bare upper arm. There should be enough room to slip a fingertip under the cuff. The bottom edge of the cuff should be 1 inch above the crease of the elbow. . Ideally, take 3 measurements at one sitting and record the average.    

## 2016-12-26 ENCOUNTER — Telehealth (HOSPITAL_COMMUNITY): Payer: Self-pay | Admitting: *Deleted

## 2016-12-26 NOTE — Telephone Encounter (Signed)
Left message on voicemail per DPR in reference to upcoming appointment scheduled on 12/28/16 with detailed instructions given per Myocardial Perfusion Study Information Sheet for the test. LM to arrive 15 minutes early, and that it is imperative to arrive on time for appointment to keep from having the test rescheduled. If you need to cancel or reschedule your appointment, please call the office within 24 hours of your appointment. Failure to do so may result in a cancellation of your appointment, and a $50 no show fee. Phone number given for call back for any questions. Cloy Cozzens Jacqueline    

## 2016-12-27 ENCOUNTER — Other Ambulatory Visit: Payer: Self-pay

## 2016-12-27 ENCOUNTER — Emergency Department (HOSPITAL_COMMUNITY)
Admission: EM | Admit: 2016-12-27 | Discharge: 2016-12-27 | Disposition: A | Discharge status: Home / Self Care | Payer: Medicare Other | Attending: Emergency Medicine | Admitting: Emergency Medicine

## 2016-12-27 ENCOUNTER — Encounter (HOSPITAL_COMMUNITY): Payer: Self-pay | Admitting: Emergency Medicine

## 2016-12-27 ENCOUNTER — Emergency Department (HOSPITAL_COMMUNITY): Payer: Medicare Other

## 2016-12-27 DIAGNOSIS — J45909 Unspecified asthma, uncomplicated: Secondary | ICD-10-CM | POA: Diagnosis not present

## 2016-12-27 DIAGNOSIS — R42 Dizziness and giddiness: Secondary | ICD-10-CM | POA: Insufficient documentation

## 2016-12-27 DIAGNOSIS — E119 Type 2 diabetes mellitus without complications: Secondary | ICD-10-CM | POA: Diagnosis not present

## 2016-12-27 DIAGNOSIS — I10 Essential (primary) hypertension: Secondary | ICD-10-CM | POA: Diagnosis not present

## 2016-12-27 DIAGNOSIS — I6789 Other cerebrovascular disease: Secondary | ICD-10-CM

## 2016-12-27 DIAGNOSIS — R55 Syncope and collapse: Secondary | ICD-10-CM | POA: Diagnosis not present

## 2016-12-27 DIAGNOSIS — Z955 Presence of coronary angioplasty implant and graft: Secondary | ICD-10-CM | POA: Insufficient documentation

## 2016-12-27 DIAGNOSIS — R404 Transient alteration of awareness: Secondary | ICD-10-CM | POA: Diagnosis not present

## 2016-12-27 DIAGNOSIS — Z7984 Long term (current) use of oral hypoglycemic drugs: Secondary | ICD-10-CM | POA: Insufficient documentation

## 2016-12-27 DIAGNOSIS — Z79899 Other long term (current) drug therapy: Secondary | ICD-10-CM | POA: Insufficient documentation

## 2016-12-27 DIAGNOSIS — I679 Cerebrovascular disease, unspecified: Secondary | ICD-10-CM | POA: Diagnosis not present

## 2016-12-27 DIAGNOSIS — E876 Hypokalemia: Secondary | ICD-10-CM | POA: Diagnosis not present

## 2016-12-27 LAB — CBC
HCT: 37.2 % (ref 36.0–46.0)
Hemoglobin: 12.2 g/dL (ref 12.0–15.0)
MCH: 26.3 pg (ref 26.0–34.0)
MCHC: 32.8 g/dL (ref 30.0–36.0)
MCV: 80.2 fL (ref 78.0–100.0)
PLATELETS: 238 10*3/uL (ref 150–400)
RBC: 4.64 MIL/uL (ref 3.87–5.11)
RDW: 14.6 % (ref 11.5–15.5)
WBC: 6.9 10*3/uL (ref 4.0–10.5)

## 2016-12-27 LAB — BASIC METABOLIC PANEL
Anion gap: 11 (ref 5–15)
BUN: 17 mg/dL (ref 6–20)
CALCIUM: 9.3 mg/dL (ref 8.9–10.3)
CO2: 26 mmol/L (ref 22–32)
CREATININE: 0.88 mg/dL (ref 0.44–1.00)
Chloride: 104 mmol/L (ref 101–111)
GFR calc non Af Amer: >60 mL/min (ref >60)
Glucose, Bld: 107 mg/dL — ABNORMAL HIGH (ref 65–99)
Potassium: 3.4 mmol/L — ABNORMAL LOW (ref 3.5–5.1)
SODIUM: 141 mmol/L (ref 135–145)

## 2016-12-27 LAB — CBG MONITORING, ED: GLUCOSE-CAPILLARY: 102 mg/dL — AB (ref 65–99)

## 2016-12-27 MED ORDER — MECLIZINE HCL 25 MG PO TABS: 25.0000 mg | ORAL_TABLET | Freq: Three times a day (TID) | ORAL | 0 refills | Status: DC | PRN

## 2016-12-27 MED ORDER — GADOBENATE DIMEGLUMINE 529 MG/ML IV SOLN
15.0000 mL | Freq: Once | INTRAVENOUS | Status: AC | PRN
  Administered 2016-12-27: 15 mL via INTRAVENOUS

## 2016-12-27 MED ORDER — POTASSIUM CHLORIDE CRYS ER 20 MEQ PO TBCR
40.0000 meq | EXTENDED_RELEASE_TABLET | Freq: Once | ORAL | Status: AC
  Administered 2016-12-27: 40 meq via ORAL
  Filled 2016-12-27: qty 2

## 2016-12-27 MED ORDER — MECLIZINE HCL 25 MG PO TABS
25.0000 mg | ORAL_TABLET | Freq: Once | ORAL | Status: AC
  Administered 2016-12-27: 25 mg via ORAL
  Filled 2016-12-27: qty 1

## 2016-12-27 NOTE — ED Provider Notes (Signed)
Received care of patient at [facility] from Dr. Winfred Leeds. Please see prior note for history, physical and prior care. Briefly, this is a 66yo female who presents with positional dizziness and nausea.  Had diplopia while ambulating. MR pending.  MRI shows no acute intracranial findings, with T2 an FLAIR hyperintensities affecting supratentorial white matter and right lateral brainstem. No hx of MS, likely superimposed small vessel disease. Discussed with Dr. Shon Hale of Neurology on the phone, who recommends outpatient follow up with PCP if asymptomatic, Neurology if symptoms return. Patient now improved with meclizine, walking with a steady gait, reports no continuing symptoms.  Discussed reasons to return to ED with pt in detail. Patient discharged in stable condition with understanding of reasons to return.    Gareth Morgan, MD 12/28/16 [phone]

## 2016-12-27 NOTE — ED Provider Notes (Signed)
[facility] DEPT Provider Note   CSN: [id number] Arrival date & time: 12/27/16  1314     History   Chief Complaint Chief Complaint  Patient presents with  . Dizziness    HPI Brooke [name] is a 66 y.o. female.  HPI Complains of dizziness meaning sensation of room spinning onset upon awakening this morning. Symptoms accompanied by nausea worse with changing positions improved with remaining still. No treatment prior to coming here though she does feel somewhat better than this morning. She denies pain anywhere. No treatment prior to coming. Past Medical History:  Diagnosis Date  . ALOPECIA 12/30/2007  . ANEMIA-NOS 02/09/2008  . CHEST PAIN 02/28/2009  . COLD SORE 10/01/2007  . COLONIC POLYPS, HX OF 02/26/2007  . Degenerative arthritis of left knee 05/18/2014  . DIABETES MELLITUS, TYPE II 11/29/2008  . ECZEMA, ATOPIC DERMATITIS 09/19/2006  . FATIGUE 09/05/2009  . GERD 02/09/2008  . GOITER NOS 09/19/2006  . GOUT 08/09/2008  . HYPERLIPIDEMIA 10/01/2007  . HYPERTENSION 02/26/2007  . INSOMNIA NOS 09/19/2006  . OTITIS EXTERNA, RIGHT 02/28/2009  . Pain in joint, lower leg 10/01/2007  . SINUSITIS- ACUTE-NOS 05/20/2009  . SKIN LESION 08/09/2008  . Syncope 11/06/2013  . Unspecified asthma(493.90) 09/19/2006  . Urinary frequency 11/29/2008  . Ventricular tachycardia, polymorphic ([place]) 01/04/2014  . Wheezing 12/30/2009    Patient Active Problem List   Diagnosis Date Noted  . SOB (shortness of breath) 12/02/2016  . Dysuria 09/12/2016  . Arthritis of right lower extremity 02/15/2015  . Effusion of right knee 01/28/2015  . Asthma with exacerbation 11/09/2014  . Pronation deformity of both feet 09/29/2014  . Greater trochanteric bursitis of left hip 08/31/2014  . Degenerative arthritis of left knee 05/18/2014  . Ventricular tachycardia, polymorphic ([place]) 01/04/2014  . Abnormal MRI 12/04/2013  . Syncope 11/06/2013  . Chest pain 11/06/2013  . Headache(784.0) 11/06/2013  . Nausea with vomiting  11/06/2013  . Cough 08/28/2013  . Hypokalemia 08/18/2012  . Diastolic dysfunction 63/84/6659  . Leukocytosis 01/03/2011  . Toe pain 12/20/2010  . Preventative health care 12/19/2010  . FATIGUE 09/05/2009  . Diabetes ([place]) 11/29/2008  . Gout 08/09/2008  . ANEMIA-NOS 02/09/2008  . GERD 02/09/2008  . ALOPECIA 12/30/2007  . Hyperlipidemia 10/01/2007  . Essential hypertension 02/26/2007  . COLONIC POLYPS, HX OF 02/26/2007  . GOITER NOS 09/19/2006  . Asthma 09/19/2006  . ECZEMA, ATOPIC DERMATITIS 09/19/2006  . Insomnia 09/19/2006   Heart murmur Past Surgical History:  Procedure Laterality Date  . ABDOMINAL HYSTERECTOMY  10/22/1999   BSO  . CHOLECYSTECTOMY  3.1.2004  . L RAS re-stented -  03/08/2005  . L RAS tx'd w/angioplasty  11/21/2003  . left cataract surgury    . LEFT HEART CATHETERIZATION WITH CORONARY ANGIOGRAM N/A 01/07/2014   Procedure: LEFT HEART CATHETERIZATION WITH CORONARY ANGIOGRAM;  Surgeon: Peter M Martinique, MD;  Location: [facility] CATH LAB;  Service: Cardiovascular;  Laterality: N/A;  . radioactive iodine ablate  3/03   thyroid 10/07/2001  . [place] PROCEDURE  6/98 neg  . Split sleep study neg for sleep apnea  11/15/2003    OB History    No data available       Home Medications    Prior to Admission medications   Medication Sig Start Date End Date Taking? Authorizing Provider  albuterol (PROVENTIL HFA;VENTOLIN HFA) 108 (90 Base) MCG/ACT inhaler Inhale 2 puffs into the lungs every 6 (six) hours as needed for wheezing. 09/12/16  Yes Biagio Borg, MD  amLODipine [facility])  10 MG tablet Take 1 tablet (10 mg total) by mouth daily. 09/12/16  Yes Biagio Borg, MD  colchicine 0.6 MG tablet Take 1 tablet (0.6 mg total) by mouth daily. 09/12/16  Yes Biagio Borg, MD  fluticasone (FLOVENT HFA) 110 MCG/ACT inhaler Inhale 2 puffs into the lungs 2 (two) times daily. 09/12/16  Yes Biagio Borg, MD  hydrochlorothiazide (HYDRODIURIL) 25 MG tablet Take 1 tablet (25 mg total) by mouth  daily. 09/12/16  Yes Biagio Borg, MD  ibuprofen (ADVIL,MOTRIN) 600 MG tablet TAKE 1 TABLET BY MOUTH EVERY 6 HOURS AS NEEDED 12/19/16  Yes Biagio Borg, MD  metFORMIN (GLUCOPHAGE-XR) 500 MG 24 hr tablet TAKE ONE TABLET BY MOUTH ONCE DAILY WITH  BREAKFAST 09/12/16  Yes Biagio Borg, MD  metoprolol succinate (TOPROL-XL) 50 MG 24 hr tablet Take 75 mg (1.5 tablets) daily. 12/03/16  Yes Turner, Eber Hong, MD  omeprazole (PRILOSEC) 20 MG capsule Take 1 capsule (20 mg total) by mouth 2 (two) times daily. 09/12/16  Yes Biagio Borg, MD  potassium chloride SA (K-DUR,KLOR-CON) 20 MEQ tablet Take 1 tablet (20 mEq total) by mouth 2 (two) times daily. 09/12/16  Yes Biagio Borg, MD  simvastatin (ZOCOR) 40 MG tablet Take 1 tablet (40 mg total) by mouth at bedtime. 09/12/16  Yes Biagio Borg, MD  zolpidem (AMBIEN) 5 MG tablet Take 1 tablet (5 mg total) by mouth at bedtime as needed for sleep. 05/11/15  Yes Biagio Borg, MD  allopurinol (ZYLOPRIM) 100 MG tablet Take 1 tablet (100 mg total) by mouth daily. Patient not taking: Reported on 12/27/2016 09/12/16   Biagio Borg, MD    Family History Family History  Problem Relation Age of Onset  . Diabetes Mother   . Heart disease Father     Social History Social History  Substance Use Topics  . Smoking status: Never Smoker  . Smokeless tobacco: Never Used  . Alcohol use No     Allergies   Ace inhibitors; Codeine; Hydrocodone; and Tramadol   Review of Systems Review of Systems  Constitutional: Negative.   HENT: Negative.   Eyes:       Chronic lateral strabismus of left eye  Respiratory: Negative.   Cardiovascular: Negative.   Gastrointestinal: Positive for nausea.  Musculoskeletal: Positive for gait problem.       Walks with cane  Skin: Negative.   Allergic/Immunologic: Positive for immunocompromised state.       Diabetic  Neurological: Positive for dizziness.       Vertigo  Psychiatric/Behavioral: Negative.   All other systems reviewed and are  negative.    Physical Exam Updated Vital Signs BP (!) 156/75 (BP Location: Right Arm)   Pulse 77   Temp 97.6 F (36.4 C)   Resp 16   Ht 5\' 3"  (1.6 m)   Wt 76.2 kg (168 lb)   SpO2 99%   BMI 29.76 kg/m   Physical Exam  Constitutional: She is oriented to person, place, and time.  Chronically ill-appearing  HENT:  Head: Normocephalic and atraumatic.  Eyes: Conjunctivae are normal. Pupils are equal, round, and reactive to light.  Neck: Neck supple. No tracheal deviation present. No thyromegaly present.  Cardiovascular: Normal rate, regular rhythm and intact distal pulses.   Murmur heard. 2/6 systolic murmur at left sternal border  Pulmonary/Chest: Effort normal and breath sounds normal.  Abdominal: Soft. Bowel sounds are normal. She exhibits no distension. There is no tenderness.  Musculoskeletal: Normal  range of motion. She exhibits no edema or tenderness.  Neurological: She is alert and oriented to person, place, and time. Coordination normal.  DTRs symmetric bilaterally at knee jerk ankle jerk and biceps toes downward going bilaterally. Gait walks with minimal assistance. She notices diplopia upon standing  Skin: Skin is warm and dry. No rash noted.  Psychiatric: She has a normal mood and affect.  Nursing note and vitals reviewed.    ED Treatments / Results  Labs (all labs ordered are listed, but only abnormal results are displayed) Labs Reviewed  CBG MONITORING, ED - Abnormal; Notable for the following:       Result Value   Glucose-Capillary 102 (*)    All other components within normal limits  CBC  BASIC METABOLIC PANEL    EKG  EKG Interpretation  Date/Time:  Thursday December 27 2016 13:35:05 EDT Ventricular Rate:  73 PR Interval:    QRS Duration: 117 QT Interval:  404 QTC Calculation: 446 R Axis:   -29 Text Interpretation:  Sinus rhythm LVH with IVCD and secondary repol abnrm No significant change since last tracing Confirmed by Orlie Dakin [phone]) on  12/27/2016 2:44:08 PM      Results for orders placed or performed during the hospital encounter of 25/95/63  Basic metabolic panel  Result Value Ref Range   Sodium 141 135 - 145 mmol/L   Potassium 3.4 (L) 3.5 - 5.1 mmol/L   Chloride 104 101 - 111 mmol/L   CO2 26 22 - 32 mmol/L   Glucose, Bld 107 (H) 65 - 99 mg/dL   BUN 17 6 - 20 mg/dL   Creatinine, Ser 0.88 0.44 - 1.00 mg/dL   Calcium 9.3 8.9 - 10.3 mg/dL   GFR calc non Af Amer >60 >60 mL/min   GFR calc Af Amer >60 >60 mL/min   Anion gap 11 5 - 15  CBC  Result Value Ref Range   WBC 6.9 4.0 - 10.5 K/uL   RBC 4.64 3.87 - 5.11 MIL/uL   Hemoglobin 12.2 12.0 - 15.0 g/dL   HCT 37.2 36.0 - 46.0 %   MCV 80.2 78.0 - 100.0 fL   MCH 26.3 26.0 - 34.0 pg   MCHC 32.8 30.0 - 36.0 g/dL   RDW 14.6 11.5 - 15.5 %   Platelets 238 150 - 400 K/uL  CBG monitoring, ED  Result Value Ref Range   Glucose-Capillary 102 (H) 65 - 99 mg/dL   No results found. Radiology No results found.  Procedures Procedures (including critical care time)  Medications Ordered in ED Medications - No data to display  MRI brain ordered to check for posterior circulation stroke given diplopia, and vertigo Initial Impression / Assessment and Plan / ED Course  I have reviewed the triage vital signs and the nursing notes.  Pertinent labs & imaging results that were available during my care of the patient were reviewed by me and considered in my medical decision making (see chart for details).     4:40 PM patient feels improved after treatment with meclizine. Oral potassium ordered. Patient is signed out to Dr. Sandrea Hughs 445 pm  Final Clinical Impressions(s) / ED Diagnoses  Dx #1 vertigo #2hypokalemia Final diagnoses:  None    New Prescriptions New Prescriptions   No medications on file     Orlie Dakin, MD 12/27/16 1645

## 2016-12-27 NOTE — ED Triage Notes (Addendum)
Pt from home via EMS with complaints of nausea, emesis, and dizziness that began this morning. Pt states she had a similar experience a few years ago but was not evaluated. Pt is scheduled for a stress test tomorrow, but currently denies pain in her chest and denies pain anywhere else in her body. Pt states symptoms are worse when she stands.

## 2016-12-28 ENCOUNTER — Ambulatory Visit (HOSPITAL_COMMUNITY): Payer: Medicare Other | Attending: Cardiovascular Disease

## 2016-12-28 DIAGNOSIS — I519 Heart disease, unspecified: Secondary | ICD-10-CM | POA: Diagnosis not present

## 2016-12-28 DIAGNOSIS — E119 Type 2 diabetes mellitus without complications: Secondary | ICD-10-CM | POA: Insufficient documentation

## 2016-12-28 DIAGNOSIS — I10 Essential (primary) hypertension: Secondary | ICD-10-CM | POA: Diagnosis not present

## 2016-12-28 DIAGNOSIS — R079 Chest pain, unspecified: Secondary | ICD-10-CM | POA: Diagnosis not present

## 2016-12-28 LAB — MYOCARDIAL PERFUSION IMAGING
CHL CUP NUCLEAR SRS: 7
CHL CUP NUCLEAR SSS: 11
CSEPPHR: 95 {beats}/min
LV sys vol: 29 mL
LVDIAVOL: 77 mL (ref 46–106)
NUC STRESS TID: 1.22
RATE: 0.28
Rest HR: 69 {beats}/min
SDS: 4

## 2016-12-28 MED ORDER — TECHNETIUM TC 99M TETROFOSMIN IV KIT
10.3000 | PACK | Freq: Once | INTRAVENOUS | Status: AC | PRN
  Administered 2016-12-28: 10.3 via INTRAVENOUS
  Filled 2016-12-28: qty 11

## 2016-12-28 MED ORDER — TECHNETIUM TC 99M TETROFOSMIN IV KIT
32.7000 | PACK | Freq: Once | INTRAVENOUS | Status: AC | PRN
Start: 2016-12-28 — End: 2016-12-28
  Administered 2016-12-28: 32.7 via INTRAVENOUS
  Filled 2016-12-28: qty 33

## 2016-12-28 MED ORDER — REGADENOSON 0.4 MG/5ML IV SOLN
0.4000 mg | Freq: Once | INTRAVENOUS | Status: AC
  Administered 2016-12-28: 0.4 mg via INTRAVENOUS

## 2017-01-04 ENCOUNTER — Ambulatory Visit: Payer: Medicare Other

## 2017-01-09 DIAGNOSIS — H31002 Unspecified chorioretinal scars, left eye: Secondary | ICD-10-CM | POA: Diagnosis not present

## 2017-01-09 DIAGNOSIS — H524 Presbyopia: Secondary | ICD-10-CM | POA: Diagnosis not present

## 2017-01-09 DIAGNOSIS — E119 Type 2 diabetes mellitus without complications: Secondary | ICD-10-CM | POA: Diagnosis not present

## 2017-01-09 LAB — HM DIABETES EYE EXAM

## 2017-01-10 ENCOUNTER — Encounter: Payer: Self-pay | Admitting: Internal Medicine

## 2017-01-10 ENCOUNTER — Ambulatory Visit (INDEPENDENT_AMBULATORY_CARE_PROVIDER_SITE_OTHER): Payer: Medicare Other | Admitting: Internal Medicine

## 2017-01-10 ENCOUNTER — Other Ambulatory Visit (INDEPENDENT_AMBULATORY_CARE_PROVIDER_SITE_OTHER): Payer: Medicare Other

## 2017-01-10 VITALS — BP 146/92 | HR 84 | Ht 63.0 in | Wt 175.0 lb

## 2017-01-10 DIAGNOSIS — G8929 Other chronic pain: Secondary | ICD-10-CM

## 2017-01-10 DIAGNOSIS — Z114 Encounter for screening for human immunodeficiency virus [HIV]: Secondary | ICD-10-CM

## 2017-01-10 DIAGNOSIS — Z1159 Encounter for screening for other viral diseases: Secondary | ICD-10-CM

## 2017-01-10 DIAGNOSIS — R930 Abnormal findings on diagnostic imaging of skull and head, not elsewhere classified: Secondary | ICD-10-CM

## 2017-01-10 DIAGNOSIS — Z23 Encounter for immunization: Secondary | ICD-10-CM | POA: Diagnosis not present

## 2017-01-10 DIAGNOSIS — I10 Essential (primary) hypertension: Secondary | ICD-10-CM

## 2017-01-10 DIAGNOSIS — M25562 Pain in left knee: Secondary | ICD-10-CM

## 2017-01-10 DIAGNOSIS — M545 Low back pain, unspecified: Secondary | ICD-10-CM

## 2017-01-10 DIAGNOSIS — E119 Type 2 diabetes mellitus without complications: Secondary | ICD-10-CM

## 2017-01-10 DIAGNOSIS — Z0001 Encounter for general adult medical examination with abnormal findings: Secondary | ICD-10-CM

## 2017-01-10 DIAGNOSIS — R42 Dizziness and giddiness: Secondary | ICD-10-CM

## 2017-01-10 DIAGNOSIS — M25561 Pain in right knee: Secondary | ICD-10-CM

## 2017-01-10 DIAGNOSIS — E2839 Other primary ovarian failure: Secondary | ICD-10-CM

## 2017-01-10 LAB — CBC WITH DIFFERENTIAL/PLATELET
BASOS PCT: 1.7 % (ref 0.0–3.0)
Basophils Absolute: 0.1 10*3/uL (ref 0.0–0.1)
EOS PCT: 1 % (ref 0.0–5.0)
Eosinophils Absolute: 0.1 10*3/uL (ref 0.0–0.7)
HCT: 38.1 % (ref 36.0–46.0)
Hemoglobin: 12.3 g/dL (ref 12.0–15.0)
Lymphocytes Relative: 29.1 % (ref 12.0–46.0)
Lymphs Abs: 2 10*3/uL (ref 0.7–4.0)
MCHC: 32.3 g/dL (ref 30.0–36.0)
MCV: 81 fl (ref 78.0–100.0)
MONO ABS: 0.7 10*3/uL (ref 0.1–1.0)
MONOS PCT: 9.7 % (ref 3.0–12.0)
NEUTROS ABS: 3.9 10*3/uL (ref 1.4–7.7)
Neutrophils Relative %: 58.5 % (ref 43.0–77.0)
PLATELETS: 256 10*3/uL (ref 150.0–400.0)
RBC: 4.7 Mil/uL (ref 3.87–5.11)
RDW: 15.1 % (ref 11.5–15.5)
WBC: 6.7 10*3/uL (ref 4.0–10.5)

## 2017-01-10 LAB — LIPID PANEL
Cholesterol: 152 mg/dL (ref 0–200)
HDL: 41.3 mg/dL (ref >39.00)
LDL CALC: 78 mg/dL (ref 0–99)
NONHDL: 110.77
Total CHOL/HDL Ratio: 4
Triglycerides: 163 mg/dL — ABNORMAL HIGH (ref 0.0–149.0)
VLDL: 32.6 mg/dL (ref 0.0–40.0)

## 2017-01-10 LAB — URINALYSIS, ROUTINE W REFLEX MICROSCOPIC
Bilirubin Urine: NEGATIVE
KETONES UR: NEGATIVE
Nitrite: POSITIVE — AB
SPECIFIC GRAVITY, URINE: 1.02 (ref 1.000–1.030)
Total Protein, Urine: NEGATIVE
Urine Glucose: NEGATIVE
Urobilinogen, UA: 0.2 (ref 0.0–1.0)
pH: 6 (ref 5.0–8.0)

## 2017-01-10 LAB — BASIC METABOLIC PANEL
BUN: 14 mg/dL (ref 6–23)
CALCIUM: 9.6 mg/dL (ref 8.4–10.5)
CO2: 27 meq/L (ref 19–32)
Chloride: 106 mEq/L (ref 96–112)
Creatinine, Ser: 1.17 mg/dL (ref 0.40–1.20)
GFR: 59.7 mL/min — AB (ref >60.00)
Glucose, Bld: 107 mg/dL — ABNORMAL HIGH (ref 70–99)
POTASSIUM: 3.7 meq/L (ref 3.5–5.1)
Sodium: 142 mEq/L (ref 135–145)

## 2017-01-10 LAB — HEPATIC FUNCTION PANEL
ALT: 5 U/L (ref 0–35)
AST: 11 U/L (ref 0–37)
Albumin: 4.3 g/dL (ref 3.5–5.2)
Alkaline Phosphatase: 99 U/L (ref 39–117)
BILIRUBIN DIRECT: 0.1 mg/dL (ref 0.0–0.3)
Total Bilirubin: 0.2 mg/dL (ref 0.2–1.2)
Total Protein: 7.1 g/dL (ref 6.0–8.3)

## 2017-01-10 LAB — TSH: TSH: 1.85 u[IU]/mL (ref 0.35–4.50)

## 2017-01-10 LAB — HEPATITIS C ANTIBODY: HCV AB: NEGATIVE

## 2017-01-10 LAB — HIV ANTIBODY (ROUTINE TESTING W REFLEX): HIV: NONREACTIVE

## 2017-01-10 LAB — HEMOGLOBIN A1C: HEMOGLOBIN A1C: 6 % (ref 4.6–6.5)

## 2017-01-10 LAB — MICROALBUMIN / CREATININE URINE RATIO
CREATININE, U: 127.2 mg/dL
MICROALB UR: 1 mg/dL (ref 0.0–1.9)
Microalb Creat Ratio: 0.8 mg/g (ref 0.0–30.0)

## 2017-01-10 MED ORDER — MECLIZINE HCL 25 MG PO TABS: 25.0000 mg | ORAL_TABLET | Freq: Three times a day (TID) | ORAL | 5 refills | Status: DC | PRN

## 2017-01-10 MED ORDER — ZOLPIDEM TARTRATE 5 MG PO TABS: 5.0000 mg | ORAL_TABLET | Freq: Every evening | ORAL | 1 refills | Status: DC | PRN

## 2017-01-10 MED ORDER — METOPROLOL SUCCINATE ER 100 MG PO TB24: 100.0000 mg | ORAL_TABLET | Freq: Every day | ORAL | 3 refills | Status: DC

## 2017-01-10 MED ORDER — TRAMADOL HCL 50 MG PO TABS: 50.0000 mg | ORAL_TABLET | Freq: Two times a day (BID) | ORAL | 2 refills | Status: DC | PRN

## 2017-01-10 NOTE — Patient Instructions (Addendum)
You had the Tdap (tetanus) and the Prevnar 13 pneumonia shots today  OK to increase the toprol XL from 75 to 100 mg per day for better blood pressure  We checked your allergies ans you have not been able to take the tramadol in the past, so the best we can do now is the ibuprofen  Please continue all other medications as before, and refills have been done if requested - the meclizine  Please have the pharmacy call with any other refills you may need.  Please continue your efforts at being more active, low cholesterol diet, and weight control.  You are otherwise up to date with prevention measures today.  Please keep your appointments with your specialists as you may have planned  You will be contacted regarding the referral for: Neurology for the abnormal MRI  You will be contacted regarding the referral for: mammogram  Please schedule the bone density test before leaving today at the scheduling desk (where you check out)  Please go to the LAB in the Basement (turn left off the elevator) for the tests to be done today  You will be contacted by phone if any changes need to be made immediately.  Otherwise, you will receive a letter about your results with an explanation, but please check with MyChart first.  Please remember to sign up for MyChart if you have not done so, as this will be important to you in the future with finding out test results, communicating by private email, and scheduling acute appointments online when needed.  If you have Medicare related insurance (such as traditional Medicare, [employer] or [employer], or similar), Please make an appointment at the [facility] with Sharee Pimple, the [employer], for your Wellness Visit in this office, which is a benefit with your insurance.  Please return in 6 months, or sooner if needed, with Lab testing done 3-5 days before

## 2017-01-10 NOTE — Progress Notes (Signed)
Subjective:    Patient ID: Brooke [name], female    DOB: 1951-05-11, 66 y.o.   MRN: [id number]  HPI  Here for wellness and f/u;  Overall doing ok;Marland Kitchen  Pt denies polydipsia, polyuria, or low sugar symptoms. Pt states overall good compliance with treatment and medications, good tolerability, and has been trying to follow appropriate diet.  Pt denies worsening depressive symptoms, suicidal ideation or panic. No fever, night sweats, wt loss, loss of appetite, or other constitutional symptoms.  Pt states good ability with ADL's, has low fall risk, home safety reviewed and adequate, no other significant changes in hearing or vision, and not active with exercise   Seen at ED with vertigo recently, still with some symptoms, now overall better.   Had abnormal MRI suggestive of MS.  Pt denies chest pain, increased sob or doe, wheezing, orthopnea, PND, increased LE swelling, palpitations, dizziness or syncope.  ,Pt denies new neurological symptoms such as new headache, or facial or extremity weakness or numbness  Pt continues to have recurring right LBP and chronic knee pain with ibuprofen no longer working, without bowel or bladder change, fever, wt loss,  worsening LE pain/numbness/weakness, worsening gait change or falls. Past Medical History:  Diagnosis Date  . ALOPECIA 12/30/2007  . ANEMIA-NOS 02/09/2008  . CHEST PAIN 02/28/2009  . COLD SORE 10/01/2007  . COLONIC POLYPS, HX OF 02/26/2007  . Degenerative arthritis of left knee 05/18/2014  . DIABETES MELLITUS, TYPE II 11/29/2008  . ECZEMA, ATOPIC DERMATITIS 09/19/2006  . FATIGUE 09/05/2009  . GERD 02/09/2008  . GOITER NOS 09/19/2006  . GOUT 08/09/2008  . HYPERLIPIDEMIA 10/01/2007  . HYPERTENSION 02/26/2007  . INSOMNIA NOS 09/19/2006  . OTITIS EXTERNA, RIGHT 02/28/2009  . Pain in joint, lower leg 10/01/2007  . SINUSITIS- ACUTE-NOS 05/20/2009  . SKIN LESION 08/09/2008  . Syncope 11/06/2013  . Unspecified asthma(493.90) 09/19/2006  . Urinary frequency 11/29/2008  .  Ventricular tachycardia, polymorphic ([place]) 01/04/2014  . Wheezing 12/30/2009   Past Surgical History:  Procedure Laterality Date  . ABDOMINAL HYSTERECTOMY  10/22/1999   BSO  . CHOLECYSTECTOMY  3.1.2004  . L RAS re-stented -  03/08/2005  . L RAS tx'd w/angioplasty  11/21/2003  . left cataract surgury    . LEFT HEART CATHETERIZATION WITH CORONARY ANGIOGRAM N/A 01/07/2014   Procedure: LEFT HEART CATHETERIZATION WITH CORONARY ANGIOGRAM;  Surgeon: Peter M Martinique, MD;  Location: [facility] CATH LAB;  Service: Cardiovascular;  Laterality: N/A;  . radioactive iodine ablate  3/03   thyroid 10/07/2001  . [place] PROCEDURE  6/98 neg  . Split sleep study neg for sleep apnea  11/15/2003    reports that she has never smoked. She has never used smokeless tobacco. She reports that she does not drink alcohol or use drugs. family history includes Diabetes in her mother; Heart disease in her father. Allergies  Allergen Reactions  . Ace Inhibitors Other (See Comments)    Reaction:  Unknown   . Codeine Nausea And Vomiting and Rash  . Hydrocodone Nausea And Vomiting  . Tramadol Nausea And Vomiting   Current Outpatient Prescriptions on File Prior to Visit  Medication Sig Dispense Refill  . albuterol (PROVENTIL HFA;VENTOLIN HFA) 108 (90 Base) MCG/ACT inhaler Inhale 2 puffs into the lungs every 6 (six) hours as needed for wheezing. 1 Inhaler 11  . allopurinol (ZYLOPRIM) 100 MG tablet Take 1 tablet (100 mg total) by mouth daily. 30 tablet 11  . amLODipine (NORVASC) 10 MG tablet Take 1 tablet (10 mg  total) by mouth daily. 90 tablet 3  . colchicine 0.6 MG tablet Take 1 tablet (0.6 mg total) by mouth daily. 30 tablet 11  . fluticasone (FLOVENT HFA) 110 MCG/ACT inhaler Inhale 2 puffs into the lungs 2 (two) times daily. 12 g 12  . hydrochlorothiazide (HYDRODIURIL) 25 MG tablet Take 1 tablet (25 mg total) by mouth daily. 30 tablet 11  . ibuprofen (ADVIL,MOTRIN) 600 MG tablet TAKE 1 TABLET BY MOUTH EVERY 6 HOURS AS NEEDED 60  tablet 2  . metFORMIN (GLUCOPHAGE-XR) 500 MG 24 hr tablet TAKE ONE TABLET BY MOUTH ONCE DAILY WITH  BREAKFAST 30 tablet 11  . omeprazole (PRILOSEC) 20 MG capsule Take 1 capsule (20 mg total) by mouth 2 (two) times daily. 60 capsule 11  . potassium chloride SA (K-DUR,KLOR-CON) 20 MEQ tablet Take 1 tablet (20 mEq total) by mouth 2 (two) times daily. 60 tablet 11  . simvastatin (ZOCOR) 40 MG tablet Take 1 tablet (40 mg total) by mouth at bedtime. 30 tablet 11   No current facility-administered medications on file prior to visit.    Review of Systems Constitutional: Negative for other unusual diaphoresis, sweats, appetite or weight changes HENT: Negative for other worsening hearing loss, ear pain, facial swelling, mouth sores or neck stiffness.   Eyes: Negative for other worsening pain, redness or other visual disturbance.  Respiratory: Negative for other stridor or swelling Cardiovascular: Negative for other palpitations or other chest pain  Gastrointestinal: Negative for worsening diarrhea or loose stools, blood in stool, distention or other pain Genitourinary: Negative for hematuria, flank pain or other change in urine volume.  Musculoskeletal: Negative for myalgias or other joint swelling.  Skin: Negative for other color change, or other wound or worsening drainage. d Spine: nontender Neurological: Negative for other syncope or numbness. Hematological: Negative for other adenopathy or swelling Psychiatric/Behavioral: Negative for hallucinations, other worsening agitation, SI, self-injury, or new decreased concentration All other system neg per pt    Objective:   Physical Exam BP (!) 146/92   Pulse 84   Ht 5\' 3"  (1.6 m)   Wt 175 lb (79.4 kg)   SpO2 100%   BMI 31.00 kg/m  VS noted,  Constitutional: Pt is oriented to person, place, and time. Appears well-developed and well-nourished, in no significant distress and comfortable Head: Normocephalic and atraumatic  Eyes: Conjunctivae and  EOM are normal. Pupils are equal, round, and reactive to light Right Ear: External ear normal without discharge Left Ear: External ear normal without discharge Nose: Nose without discharge or deformity Mouth/Throat: Oropharynx is without other ulcerations and moist  Neck: Normal range of motion. Neck supple. No JVD present. No tracheal deviation present or significant neck LA or mass Cardiovascular: Normal rate, regular rhythm, normal heart sounds and intact distal pulses.   Pulmonary/Chest: WOB normal and breath sounds without rales or wheezing  Abdominal: Soft. Bowel sounds are normal. NT. No HSM  Musculoskeletal: Normal range of motion. Exhibits no edema Lymphadenopathy: Has no other cervical adenopathy.  Neurological: Pt is alert and oriented to person, place, and time. Pt has normal reflexes. No cranial nerve deficit. Motor grossly intact, Gait intact Spine nontender bilat knees with bony degen changes without effusions Skin: Skin is warm and dry. No rash noted or new ulcerations Psychiatric:  Has normal mood and affect. Behavior is normal without agitation No other exam findings    Assessment & Plan:

## 2017-01-11 ENCOUNTER — Encounter: Payer: Self-pay | Admitting: Internal Medicine

## 2017-01-12 DIAGNOSIS — R42 Dizziness and giddiness: Secondary | ICD-10-CM | POA: Insufficient documentation

## 2017-01-12 DIAGNOSIS — M545 Low back pain, unspecified: Secondary | ICD-10-CM | POA: Insufficient documentation

## 2017-01-12 NOTE — Assessment & Plan Note (Signed)
Mild for tylenol prn,  to f/u any worsening symptoms or concerns

## 2017-01-12 NOTE — Assessment & Plan Note (Signed)
Mild, for meclizine prn,  to f/u any worsening symptoms or concerns 

## 2017-01-12 NOTE — Assessment & Plan Note (Signed)
Mild to mod uncontrolled, for tramadol prn

## 2017-01-12 NOTE — Assessment & Plan Note (Signed)
stable overall by history and exam, recent data reviewed with pt, and pt to continue medical treatment as before,  to f/u any worsening symptoms or concerns Lab Results  Component Value Date   HGBA1C 6.0 01/10/2017

## 2017-01-12 NOTE — Assessment & Plan Note (Signed)
Suggestive of MS, for referral neurology  In addition to the time spent performing CPE, I spent an additional 25 minutes face to face,in which greater than 50% of this time was spent in counseling and coordination of care for patient's acute illness as documented,. Including the differential dx, further evaluation and management of abnormal MRI, vertigo, HTN uncontrolled, low back pain, bilat knee pain

## 2017-01-12 NOTE — Assessment & Plan Note (Signed)
Overall doing well, age appropriate education and counseling updated, referrals for preventative services and immunizations addressed, dietary and smoking counseling addressed, most recent labs reviewed.  I have personally reviewed and have noted:  1) the patient's medical and social history 2) The pt's use of alcohol, tobacco, and illicit drugs 3) The patient's current medications and supplements 4) Functional ability including ADL's, fall risk, home safety risk, hearing and visual impairment 5) Diet and physical activities 6) Evidence for depression or mood disorder 7) The patient's height, weight, and BMI have been recorded in the chart  I have made referrals, and provided counseling and education based on review of the above, for dxa, DG mammogram, Tdap, prevnar 13, hep c and hiv screens

## 2017-01-12 NOTE — Assessment & Plan Note (Signed)
Uncontrolled, to increase the toprol XL 100 qd,  to f/u any worsening symptoms or concerns

## 2017-01-14 ENCOUNTER — Ambulatory Visit (INDEPENDENT_AMBULATORY_CARE_PROVIDER_SITE_OTHER)
Admission: RE | Admit: 2017-01-14 | Discharge: 2017-01-14 | Disposition: A | Discharge status: Home / Self Care | Payer: Medicare Other | Source: Ambulatory Visit | Attending: Internal Medicine | Admitting: Internal Medicine

## 2017-01-14 DIAGNOSIS — E2839 Other primary ovarian failure: Secondary | ICD-10-CM | POA: Diagnosis not present

## 2017-01-17 ENCOUNTER — Encounter: Payer: Self-pay | Admitting: Neurology

## 2017-01-17 ENCOUNTER — Ambulatory Visit (INDEPENDENT_AMBULATORY_CARE_PROVIDER_SITE_OTHER): Payer: Medicare Other | Admitting: Neurology

## 2017-01-17 VITALS — BP 140/76 | HR 81 | Ht 63.0 in | Wt 170.0 lb

## 2017-01-17 DIAGNOSIS — H8110 Benign paroxysmal vertigo, unspecified ear: Secondary | ICD-10-CM

## 2017-01-17 DIAGNOSIS — R9082 White matter disease, unspecified: Secondary | ICD-10-CM

## 2017-01-17 DIAGNOSIS — R930 Abnormal findings on diagnostic imaging of skull and head, not elsewhere classified: Secondary | ICD-10-CM | POA: Diagnosis not present

## 2017-01-17 NOTE — Progress Notes (Signed)
NEUROLOGY FOLLOW UP OFFICE NOTE  Brooke [name] [id number]  HISTORY OF PRESENT ILLNESS: Brooke [name] is a 66 year old right-handed woman with history of nonsustained ventricular tachycardia-polymorphic syncope, hypertension, hyperlipidemia, type II diabetes mellitus, goiter, GERD, anemia and fatigue whom I last saw for abnormal brain MRI in 2015 follows up for the same.  UPDATE: Patient was last seen in 2015.  She was supposed to have a repeat MRI at that time but was lost to follow up.    She presented to the ED at [facility] on 12/27/16 for vertigo.  She woke up that morning with sensation of the room spinning associated with nausea.  It was worse with change in position and better when laying still.  MRI of brain with and without contrast was personally reviewed and again demonstrated T2 and FLAIR hyperintensity in the right lateral pons (as demonstrated on prior MRI, although less prominent now), as well as T2 and FLAIR hyperintensities in the periventricular and subcortical white matter, all without any abnormal enhancement.  She is currently symptom-free.   HISTORY: On 09/17/13, she had lost consciousness while at work.  She was unconscious for 5 minutes and woke up on the floor.  She had to crawl for help.  Following this, she began having recurrent headache and chest pain radiating into the right arm.  She was evaluated by cardiology for syncope.  She continued to have recurrent episodes of dizziness.  Initially they were associated with nausea, vomiting and diarrhea, but not any longer.  They lasted 10 minutes and occured daily.  They occured spontaneously and were not positional.  If she is standing during a spell, she becomes off balance and will veer towards the right.  She denies difficulty using her right hand.  She denies focal weakness, numbness.  She was subsequently found to have nonsustained polymorphic ventricular tachycardia.  MRI of the brain without contrast was  performed on 11/20/13, revealing 1.3 cm region of increased FLAIR and T2 signal with mild swelling at the junction of the right lateral pons and right middle cerebellar peduncle and without restricted diffusion.  Impression discussed possibility of MS. She also had few foci of signal abnormality in the cerebral hemispheres as well.  MRI of the brain with and without contrast performed on 01/12/14, again showing hyperintensity within the right anterior pons at the level of the right fifth cranial nerve root entry zone without abnormal enhancement.  There was also noted small cerebral spinal fluid collection within the sella, possibly focal area of herniation of the dura into the pituitary gland versus primary pituitary cystic lesion.  MRA of the head revealed some intracranial atherosclerotic changes, including mild to moderate narrowing of the right AICA, not felt to be responsible for the right pontine signal abnormality.  She denies prior episodes of neurologic disturbance such as unilateral numbness or weakness, transient vision loss or ataxia.   There is no family history of MS. Her son does have a diagnosis of neurofibromatosis.  PAST MEDICAL HISTORY: Past Medical History:  Diagnosis Date  . ALOPECIA 12/30/2007  . ANEMIA-NOS 02/09/2008  . CHEST PAIN 02/28/2009  . COLD SORE 10/01/2007  . COLONIC POLYPS, HX OF 02/26/2007  . Degenerative arthritis of left knee 05/18/2014  . DIABETES MELLITUS, TYPE II 11/29/2008  . ECZEMA, ATOPIC DERMATITIS 09/19/2006  . FATIGUE 09/05/2009  . GERD 02/09/2008  . GOITER NOS 09/19/2006  . GOUT 08/09/2008  . HYPERLIPIDEMIA 10/01/2007  . HYPERTENSION 02/26/2007  . INSOMNIA NOS  09/19/2006  . OTITIS EXTERNA, RIGHT 02/28/2009  . Pain in joint, lower leg 10/01/2007  . SINUSITIS- ACUTE-NOS 05/20/2009  . SKIN LESION 08/09/2008  . Syncope 11/06/2013  . Unspecified asthma(493.90) 09/19/2006  . Urinary frequency 11/29/2008  . Ventricular tachycardia, polymorphic ([place]) 01/04/2014  . Wheezing  12/30/2009    MEDICATIONS: Current Outpatient Prescriptions on File Prior to Visit  Medication Sig Dispense Refill  . albuterol (PROVENTIL HFA;VENTOLIN HFA) 108 (90 Base) MCG/ACT inhaler Inhale 2 puffs into the lungs every 6 (six) hours as needed for wheezing. 1 Inhaler 11  . allopurinol (ZYLOPRIM) 100 MG tablet Take 1 tablet (100 mg total) by mouth daily. 30 tablet 11  . amLODipine (NORVASC) 10 MG tablet Take 1 tablet (10 mg total) by mouth daily. 90 tablet 3  . colchicine 0.6 MG tablet Take 1 tablet (0.6 mg total) by mouth daily. 30 tablet 11  . fluticasone (FLOVENT HFA) 110 MCG/ACT inhaler Inhale 2 puffs into the lungs 2 (two) times daily. 12 g 12  . hydrochlorothiazide (HYDRODIURIL) 25 MG tablet Take 1 tablet (25 mg total) by mouth daily. 30 tablet 11  . ibuprofen (ADVIL,MOTRIN) 600 MG tablet TAKE 1 TABLET BY MOUTH EVERY 6 HOURS AS NEEDED 60 tablet 2  . meclizine (ANTIVERT) 25 MG tablet Take 1 tablet (25 mg total) by mouth 3 (three) times daily as needed for dizziness. 30 tablet 5  . metFORMIN (GLUCOPHAGE-XR) 500 MG 24 hr tablet TAKE ONE TABLET BY MOUTH ONCE DAILY WITH  BREAKFAST 30 tablet 11  . metoprolol succinate (TOPROL-XL) 100 MG 24 hr tablet Take 1 tablet (100 mg total) by mouth daily. Take with or immediately following a meal. 90 tablet 3  . omeprazole (PRILOSEC) 20 MG capsule Take 1 capsule (20 mg total) by mouth 2 (two) times daily. 60 capsule 11  . potassium chloride SA (K-DUR,KLOR-CON) 20 MEQ tablet Take 1 tablet (20 mEq total) by mouth 2 (two) times daily. 60 tablet 11  . simvastatin (ZOCOR) 40 MG tablet Take 1 tablet (40 mg total) by mouth at bedtime. 30 tablet 11  . zolpidem (AMBIEN) 5 MG tablet Take 1 tablet (5 mg total) by mouth at bedtime as needed for sleep. 90 tablet 1   No current facility-administered medications on file prior to visit.     ALLERGIES: Allergies  Allergen Reactions  . Ace Inhibitors Other (See Comments)    Reaction:  Unknown   . Codeine Nausea And  Vomiting and Rash  . Hydrocodone Nausea And Vomiting  . Tramadol Nausea And Vomiting    FAMILY HISTORY: Family History  Problem Relation Age of Onset  . Diabetes Mother   . Heart disease Father     SOCIAL HISTORY: Social History   Social History  . Marital status: Married    Spouse name: N/A  . Number of children: 2  . Years of education: N/A   Occupational History  . CNA [employer]   Social History Main Topics  . Smoking status: Never Smoker  . Smokeless tobacco: Never Used  . Alcohol use No  . Drug use: No  . Sexual activity: Not on file   Other Topics Concern  . Not on file   Social History Narrative  . No narrative on file    REVIEW OF SYSTEMS: Constitutional: No fevers, chills, or sweats, no generalized fatigue, change in appetite Eyes: No visual changes, double vision, eye pain Ear, nose and throat: No hearing loss, ear pain, nasal congestion, sore throat Cardiovascular: No chest pain,  palpitations Respiratory:  No shortness of breath at rest or with exertion, wheezes GastrointestinaI: No nausea, vomiting, diarrhea, abdominal pain, fecal incontinence Genitourinary:  No dysuria, urinary retention or frequency Musculoskeletal:  No neck pain, back pain Integumentary: No rash, pruritus, skin lesions Neurological: as above Psychiatric: No depression, insomnia, anxiety Endocrine: No palpitations, fatigue, diaphoresis, mood swings, change in appetite, change in weight, increased thirst Hematologic/Lymphatic:  No purpura, petechiae. Allergic/Immunologic: no itchy/runny eyes, nasal congestion, recent allergic reactions, rashes  PHYSICAL EXAM: Vitals:   01/17/17 1131  BP: 140/76  Pulse: 81   General: No acute distress.  Patient appears well-groomed.  Head:  Normocephalic/atraumatic Eyes:  Fundi examined but not visualized Neck: supple, no paraspinal tenderness, full range of motion Heart:  Regular rate and rhythm Lungs:  Clear to auscultation  bilaterally Back: No paraspinal tenderness Neurological Exam: alert and oriented to person, place, and time. Attention span and concentration intact, recent and remote memory intact, fund of knowledge intact.  Speech fluent and not dysarthric, language intact.  Left exophthalmos. Left eye hypertropic and abducted on primary gaze. Reduced adduction of the left eye on extraocular muscle testing (these findings are reportedly chronic), no nystagmus, no ptosis.  Otherwise CN II-XII intact. Bulk and tone normal, muscle strength 5/5 throughout.  Sensation to light touch, temperature and vibration intact.  Deep tendon reflexes 2+ throughout, toes downgoing.  Finger to nose and heel to shin testing intact.  Gait with a limp and inversion of right foot due to arthritis, Romberg negative.  IMPRESSION: 1.  Abnormal MRI of brain.  Hyperintensity in the pons is suspicious for demyelination.  However, it is chronic and is less prominent than prior imaging from 2015.  It may have been a subclinical monophasic event and does not appear to be a progressive condition.  Hyperintensities in the cerebral white matter are nonspecific and may simply be chronic small vessel ischemic changes. She has no clinical history consistent with MS.    2.  I think the dizzy spell was BPPV and not related to the MRI findings.    PLAN: 1.  Since she has never had clinical symptoms and her MRI is stable, I wouldn't perform an LP for CSF analysis at this point.  We will check MRI of cervical spine with and without contrast to evaluate for any cord lesion.  If she has a cord lesion, then I would diagnose her with MS and will have her return to discuss further.  15 minutes spent face to face with patient, over 50% spent discussing MRI findings, my impression and plan.  Metta Clines, DO  CC:  [name], MD

## 2017-01-17 NOTE — Patient Instructions (Signed)
The MRI shows something suspicious for multiple sclerosis but you currently don't fit the diagnostic criteria for it.  The finding is unchanged since 3 years ago, which is good  1.  We will check MRI of cervical spine with and without contrast. 2.  If the MRI shows something, I will have you come back to discuss further

## 2017-01-21 ENCOUNTER — Encounter: Payer: Self-pay | Admitting: Internal Medicine

## 2017-01-30 ENCOUNTER — Ambulatory Visit: Payer: Medicare Other

## 2017-01-31 ENCOUNTER — Ambulatory Visit
Admission: RE | Admit: 2017-01-31 | Discharge: 2017-01-31 | Disposition: A | Discharge status: Home / Self Care | Payer: Medicare Other | Source: Ambulatory Visit | Attending: Internal Medicine | Admitting: Internal Medicine

## 2017-01-31 DIAGNOSIS — Z0001 Encounter for general adult medical examination with abnormal findings: Secondary | ICD-10-CM

## 2017-01-31 DIAGNOSIS — Z1231 Encounter for screening mammogram for malignant neoplasm of breast: Secondary | ICD-10-CM | POA: Diagnosis not present

## 2017-02-10 ENCOUNTER — Ambulatory Visit
Admission: RE | Admit: 2017-02-10 | Discharge: 2017-02-10 | Disposition: A | Discharge status: Home / Self Care | Payer: Medicare Other | Source: Ambulatory Visit | Attending: Neurology | Admitting: Neurology

## 2017-02-10 DIAGNOSIS — R9082 White matter disease, unspecified: Secondary | ICD-10-CM

## 2017-02-10 DIAGNOSIS — M4802 Spinal stenosis, cervical region: Secondary | ICD-10-CM | POA: Diagnosis not present

## 2017-02-10 MED ORDER — GADOBENATE DIMEGLUMINE 529 MG/ML IV SOLN
16.0000 mL | Freq: Once | INTRAVENOUS | Status: AC | PRN
  Administered 2017-02-10: 16 mL via INTRAVENOUS

## 2017-02-11 ENCOUNTER — Telehealth: Payer: Self-pay | Admitting: *Deleted

## 2017-02-11 NOTE — Telephone Encounter (Signed)
-----   Message from Pieter Partridge, DO sent at 02/11/2017  7:03 AM EDT ----- MRI of cervical spine shows a couple of disc bulges but the spinal cord look normal.  No further testing unless clinically indicated.

## 2017-02-11 NOTE — Telephone Encounter (Signed)
Left message informing patient.

## 2017-03-05 ENCOUNTER — Ambulatory Visit: Payer: Medicare Other

## 2017-03-22 ENCOUNTER — Other Ambulatory Visit: Payer: Self-pay | Admitting: Internal Medicine

## 2017-04-02 DIAGNOSIS — E119 Type 2 diabetes mellitus without complications: Secondary | ICD-10-CM | POA: Diagnosis not present

## 2017-06-23 DIAGNOSIS — E119 Type 2 diabetes mellitus without complications: Secondary | ICD-10-CM | POA: Diagnosis not present

## 2017-06-25 ENCOUNTER — Other Ambulatory Visit: Payer: Self-pay | Admitting: Internal Medicine

## 2017-07-12 ENCOUNTER — Ambulatory Visit: Payer: Medicare Other | Admitting: Internal Medicine

## 2017-07-19 ENCOUNTER — Ambulatory Visit (INDEPENDENT_AMBULATORY_CARE_PROVIDER_SITE_OTHER): Payer: Medicare Other | Admitting: Internal Medicine

## 2017-07-19 ENCOUNTER — Encounter: Payer: Self-pay | Admitting: Internal Medicine

## 2017-07-19 ENCOUNTER — Other Ambulatory Visit (INDEPENDENT_AMBULATORY_CARE_PROVIDER_SITE_OTHER): Payer: Medicare Other

## 2017-07-19 VITALS — BP 154/92 | HR 80 | Temp 97.8°F | Ht 63.0 in | Wt 163.0 lb

## 2017-07-19 DIAGNOSIS — E785 Hyperlipidemia, unspecified: Secondary | ICD-10-CM | POA: Diagnosis not present

## 2017-07-19 DIAGNOSIS — Z23 Encounter for immunization: Secondary | ICD-10-CM | POA: Diagnosis not present

## 2017-07-19 DIAGNOSIS — M545 Low back pain: Secondary | ICD-10-CM

## 2017-07-19 DIAGNOSIS — F418 Other specified anxiety disorders: Secondary | ICD-10-CM | POA: Diagnosis not present

## 2017-07-19 DIAGNOSIS — E119 Type 2 diabetes mellitus without complications: Secondary | ICD-10-CM | POA: Diagnosis not present

## 2017-07-19 DIAGNOSIS — I10 Essential (primary) hypertension: Secondary | ICD-10-CM | POA: Diagnosis not present

## 2017-07-19 DIAGNOSIS — M25561 Pain in right knee: Secondary | ICD-10-CM | POA: Diagnosis not present

## 2017-07-19 DIAGNOSIS — M1000 Idiopathic gout, unspecified site: Secondary | ICD-10-CM

## 2017-07-19 DIAGNOSIS — G8929 Other chronic pain: Secondary | ICD-10-CM

## 2017-07-19 DIAGNOSIS — M25562 Pain in left knee: Secondary | ICD-10-CM

## 2017-07-19 LAB — HEPATIC FUNCTION PANEL
ALT: 7 U/L (ref 0–35)
AST: 13 U/L (ref 0–37)
Albumin: 4.4 g/dL (ref 3.5–5.2)
Alkaline Phosphatase: 86 U/L (ref 39–117)
BILIRUBIN TOTAL: 0.3 mg/dL (ref 0.2–1.2)
Bilirubin, Direct: 0 mg/dL (ref 0.0–0.3)
Total Protein: 7.4 g/dL (ref 6.0–8.3)

## 2017-07-19 LAB — BASIC METABOLIC PANEL
BUN: 20 mg/dL (ref 6–23)
CHLORIDE: 105 meq/L (ref 96–112)
CO2: 29 mEq/L (ref 19–32)
Calcium: 9.8 mg/dL (ref 8.4–10.5)
Creatinine, Ser: 1.08 mg/dL (ref 0.40–1.20)
GFR: 65.37 mL/min (ref >60.00)
GLUCOSE: 101 mg/dL — AB (ref 70–99)
POTASSIUM: 4 meq/L (ref 3.5–5.1)
SODIUM: 142 meq/L (ref 135–145)

## 2017-07-19 LAB — LIPID PANEL
CHOLESTEROL: 155 mg/dL (ref 0–200)
HDL: 43.8 mg/dL (ref >39.00)
LDL Cholesterol: 88 mg/dL (ref 0–99)
NONHDL: 111.29
Total CHOL/HDL Ratio: 4
Triglycerides: 117 mg/dL (ref 0.0–149.0)
VLDL: 23.4 mg/dL (ref 0.0–40.0)

## 2017-07-19 LAB — HEMOGLOBIN A1C: HEMOGLOBIN A1C: 5.8 % (ref 4.6–6.5)

## 2017-07-19 MED ORDER — CITALOPRAM HYDROBROMIDE 10 MG PO TABS: 10.0000 mg | ORAL_TABLET | Freq: Every day | ORAL | 3 refills | Status: DC

## 2017-07-19 MED ORDER — METOPROLOL SUCCINATE ER 100 MG PO TB24: 100.0000 mg | ORAL_TABLET | Freq: Every day | ORAL | 3 refills | Status: DC

## 2017-07-19 MED ORDER — IBUPROFEN 800 MG PO TABS: 800.0000 mg | ORAL_TABLET | Freq: Two times a day (BID) | ORAL | 5 refills | Status: DC | PRN

## 2017-07-19 MED ORDER — AMLODIPINE BESYLATE 10 MG PO TABS: 10.0000 mg | ORAL_TABLET | Freq: Every day | ORAL | 3 refills | Status: DC

## 2017-07-19 MED ORDER — HYDROCHLOROTHIAZIDE 25 MG PO TABS: 25.0000 mg | ORAL_TABLET | Freq: Every day | ORAL | 3 refills | Status: DC

## 2017-07-19 MED ORDER — ALLOPURINOL 100 MG PO TABS: 100.0000 mg | ORAL_TABLET | Freq: Every day | ORAL | 3 refills | Status: DC

## 2017-07-19 MED ORDER — SIMVASTATIN 40 MG PO TABS: 40.0000 mg | ORAL_TABLET | Freq: Every day | ORAL | 3 refills | Status: DC

## 2017-07-19 MED ORDER — METFORMIN HCL ER 500 MG PO TB24: ORAL_TABLET | ORAL | 3 refills | Status: DC

## 2017-07-19 MED ORDER — MECLIZINE HCL 25 MG PO TABS: 25.0000 mg | ORAL_TABLET | Freq: Three times a day (TID) | ORAL | 5 refills | Status: DC | PRN

## 2017-07-19 NOTE — Assessment & Plan Note (Signed)
[place] for increased ibuprofen bid prn, cont colchicine

## 2017-07-19 NOTE — Patient Instructions (Addendum)
You had the flu shot today  Please take all new medication as prescribed - the citalopram 10 mg daily for low mood and stress  Please continue all other medications as before, and refills have been done if requested.  Please have the pharmacy call with any other refills you may need.  Please continue your efforts at being more active, low cholesterol diet, and weight control.  Please keep your appointments with your specialists as you may have planned  Please go to the LAB in the Basement (turn left off the elevator) for the tests to be done today  You will be contacted by phone if any changes need to be made immediately.  Otherwise, you will receive a letter about your results with an explanation, but please check with MyChart first.  Please remember to sign up for MyChart if you have not done so, as this will be important to you in the future with finding out test results, communicating by private email, and scheduling acute appointments online when needed.  Please return in 6 months, or sooner if needed, with Lab testing done 3-5 days before

## 2017-07-19 NOTE — Assessment & Plan Note (Signed)
Ok to increase ibuprofen 800 bid prn, also add tizanidine prn,  to f/u any worsening symptoms or concerns, and f/u with sports med as planned

## 2017-07-19 NOTE — Assessment & Plan Note (Signed)
Mild to mod, for start celexa 10 qd, declines referral to counseling, no Si or HI,  to f/u any worsening symptoms or concerns

## 2017-07-19 NOTE — Progress Notes (Signed)
Subjective:    Patient ID: Brooke [name], female    DOB: May 20, 1951, 66 y.o.   MRN: [id number]  HPI  Here to f/u; overall doing ok,  Pt denies chest pain, increasing sob or doe, wheezing, orthopnea, PND, increased LE swelling, palpitations, dizziness or syncope.  Pt denies new neurological symptoms such as new headache, or facial or extremity weakness or numbness.  Pt denies polydipsia, polyuria, or low sugar episode.  Pt states overall good compliance with meds, mostly trying to follow appropriate diet, with wt overall stable,  but little exercise however   BP at home < 140/90.   Has also had much more stress recently, husband fell on xmas day and had to call EMS to get back in chair.  He almost fell again last PM, but she caught him but was a big strain on her back.   Having more anxiety, more headaches recently and mentions recurring perioral numbness.  Marland Kitchen  Has had mild to mod worsening depressive symptoms, but no suicidal ideation, or panic; has ongoing anxiety, not increased recently.  Does c/o ongoing fatigue, but denies signficant daytime hypersomnolence. Mother died 11-11-18still grieving as well. Has ongoing bilat knee pain with mild swelling and back pain, to see Dr Tamala Julian about jan 16, tramadol has not worked, does not want higher narcotic due to nausea with prior use.  Asks for higher dose ibuprofen and does not take regularly.   Past Medical History:  Diagnosis Date  . ALOPECIA 12/30/2007  . ANEMIA-NOS 02/09/2008  . CHEST PAIN 02/28/2009  . COLD SORE 10/01/2007  . COLONIC POLYPS, HX OF 02/26/2007  . Degenerative arthritis of left knee 05/18/2014  . DIABETES MELLITUS, TYPE II 11/29/2008  . ECZEMA, ATOPIC DERMATITIS 09/19/2006  . FATIGUE 09/05/2009  . GERD 02/09/2008  . GOITER NOS 09/19/2006  . GOUT 08/09/2008  . HYPERLIPIDEMIA 10/01/2007  . HYPERTENSION 02/26/2007  . INSOMNIA NOS 09/19/2006  . OTITIS EXTERNA, RIGHT 02/28/2009  . Pain in joint, lower leg 10/01/2007  . SINUSITIS- ACUTE-NOS  06/02/2009  . SKIN LESION 08/09/2008  . Syncope 11/06/2013  . Unspecified asthma(493.90) 09/19/2006  . Urinary frequency 11/29/2008  . Ventricular tachycardia, polymorphic ([place]) 01/04/2014  . Wheezing 12/30/2009   Past Surgical History:  Procedure Laterality Date  . ABDOMINAL HYSTERECTOMY  10/22/1999   BSO  . CHOLECYSTECTOMY  3.1.2004  . L RAS re-stented -  03/08/2005  . L RAS tx'd w/angioplasty  11/21/2003  . left cataract surgury    . LEFT HEART CATHETERIZATION WITH CORONARY ANGIOGRAM N/A 01/07/2014   Procedure: LEFT HEART CATHETERIZATION WITH CORONARY ANGIOGRAM;  Surgeon: Peter M Martinique, MD;  Location[facility] C[facility] LAB;  Service: Cardiovascular;  Laterality: N/A;  . radioactive iodine ablate  3/03   thyroid 10/07/2001  . [place] PROCEDURE  6/98 neg  . Split sleep study neg for sleep apnea  11/15/2003    reports that  has never smoked. she has never used smokeless tobacco. She reports that she does not drink alcohol or use drugs. family history includes Diabetes in her mother; Heart disease in her father. Allergies  Allergen Reactions  . Ace Inhibitors Other (See Comments)    Reaction:  Unknown   . Codeine Nausea And Vomiting and Rash  . Hydrocodone Nausea And Vomiting  . Tramadol Nausea And Vomiting   Current Outpatient Medications on File Prior to Visit  Medication Sig Dispense Refill  . albuterol (PROVENTIL HFA;VENTOLIN HFA) 108 (90 Base) MCG/ACT inhaler Inhale 2 puffs into the lungs  every 6 (six) hours as needed for wheezing. 1 Inhaler 11  . colchicine 0.6 MG tablet Take 1 tablet (0.6 mg total) by mouth daily. 30 tablet 11  . fluticasone (FLOVENT HFA) 110 MCG/ACT inhaler Inhale 2 puffs into the lungs 2 (two) times daily. 12 g 12  . omeprazole (PRILOSEC) 20 MG capsule Take 1 capsule (20 mg total) by mouth 2 (two) times daily. 60 capsule 11  . potassium chloride SA (K-DUR,KLOR-CON) 20 MEQ tablet Take 1 tablet (20 mEq total) by mouth 2 (two) times daily. 60 tablet 11  . zolpidem (AMBIEN) 5  MG tablet Take 1 tablet (5 mg total) by mouth at bedtime as needed for sleep. 90 tablet 1   No current facility-administered medications on file prior to visit.    Review of Systems  Constitutional: Negative for other unusual diaphoresis or sweats HENT: Negative for ear discharge or swelling Eyes: Negative for other worsening visual disturbances Respiratory: Negative for stridor or other swelling  Gastrointestinal: Negative for worsening distension or other blood Genitourinary: Negative for retention or other urinary change Musculoskeletal: Negative for other MSK pain or swelling Skin: Negative for color change or other new lesions Neurological: Negative for worsening tremors and other numbness  Psychiatric/Behavioral: Negative for worsening agitation or other fatigue All other system neg per pt    Objective:   Physical Exam BP (!) 154/92   Pulse 80   Temp 97.8 F (36.6 C) (Oral)   Ht 5\' 3"  (1.6 m)   Wt 163 lb (73.9 kg)   SpO2 99%   BMI 28.87 kg/m  VS noted,  Constitutional: Pt appears in NAD HENT: Head: NCAT.  Right Ear: External ear normal.  Left Ear: External ear normal.  Eyes: . Pupils are equal, round, and reactive to light. Conjunctivae and EOM are normal Nose: without d/c or deformity Neck: Neck supple. Gross normal ROM Cardiovascular: Normal rate and regular rhythm.   Pulmonary/Chest: Effort normal and breath sounds without rales or wheezing.  Abd:  Soft, NT, ND, + BS, no organomegaly Neurological: Pt is alert. At baseline orientation, motor grossly intact No joint effusions Skin: Skin is warm. No rashes, other new lesions, no LE edema Psychiatric: Pt behavior is normal without agitation. + depressed affect  No other exam findings Lab Results  Component Value Date   WBC 6.7 01/10/2017   HGB 12.3 01/10/2017   HCT 38.1 01/10/2017   PLT 256.0 01/10/2017   GLUCOSE 101 (H) 07/19/2017   CHOL 155 07/19/2017   TRIG 117.0 07/19/2017   HDL 43.80 07/19/2017    LDLDIRECT 139.1 09/05/2009   LDLCALC 88 07/19/2017   ALT 7 07/19/2017   AST 13 07/19/2017   NA 142 07/19/2017   K 4.0 07/19/2017   CL 105 07/19/2017   CREATININE 1.08 07/19/2017   BUN 20 07/19/2017   CO2 29 07/19/2017   TSH 1.85 01/10/2017   INR 0.95 01/07/2014   HGBA1C 5.8 07/19/2017   MICROALBUR 1.0 01/10/2017      Assessment & Plan:

## 2017-07-20 NOTE — Assessment & Plan Note (Signed)
stable overall by history and exam, recent data reviewed with pt, and pt to continue medical treatment as before,  to f/u any worsening symptoms or concerns  

## 2017-07-20 NOTE — Assessment & Plan Note (Signed)
stable overall by history and exam, recent data reviewed with pt, and pt to continue medical treatment as before,  to f/u any worsening symptoms or concerns BP Readings from Last 3 Encounters:  07/19/17 (!) 154/92  01/17/17 140/76  01/10/17 (!) 146/92

## 2017-08-07 ENCOUNTER — Other Ambulatory Visit: Payer: Medicare Other

## 2017-08-07 ENCOUNTER — Ambulatory Visit: Payer: Self-pay

## 2017-08-07 ENCOUNTER — Encounter: Payer: Self-pay | Admitting: Family Medicine

## 2017-08-07 ENCOUNTER — Ambulatory Visit: Payer: Medicare Other | Admitting: Family Medicine

## 2017-08-07 VITALS — BP 142/78 | HR 82 | Ht 63.0 in | Wt 165.0 lb

## 2017-08-07 DIAGNOSIS — G8929 Other chronic pain: Secondary | ICD-10-CM

## 2017-08-07 DIAGNOSIS — M170 Bilateral primary osteoarthritis of knee: Secondary | ICD-10-CM | POA: Insufficient documentation

## 2017-08-07 DIAGNOSIS — M25562 Pain in left knee: Secondary | ICD-10-CM | POA: Diagnosis not present

## 2017-08-07 DIAGNOSIS — M25561 Pain in right knee: Secondary | ICD-10-CM

## 2017-08-07 NOTE — Assessment & Plan Note (Signed)
Bilateral injections given today.  Tolerated the procedure well.  We discussed icing regimen and home exercises, we discussed which activities of doing which wants to avoid.  Patient was to increase activity slowly over the course of next several days.  Due to patient's abnormal thigh to calf ratio I do believe that custom bracing will be helpful for secondary to the instability.  Patient wants to avoid any type of surgical intervention.  Improvement she could be a candidate for Visco supplementation.  Follow-up again in 4 weeks

## 2017-08-07 NOTE — Progress Notes (Signed)
[name] [place], [place] [address] Phone: [phone] Subjective:    I'm seeing this patient by the request  of:    CC: Knee pain  XNA:[id number]  Brooke [name] is a 67 y.o. female coming in with complaint of knee pain.  Was seen by me 3 years ago and as well as osteoarthritic changes of the knee.  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  Patient states that her right is worse than her left. She has been limping for one month and has been experiencing burning and popping. She said that she fell at [employer] 2 weeks ago due to the pain. Patient ambulates with a cane today. She is having trouble sleeping.       Patient's last x-rays of the right knee showed tricompartmental degenerative arthritis January 28, 2015.  Independently visualized by me.  Patient was to undergo an MRI but patient did not have it done.  Past Medical History:  Diagnosis Date  . ALOPECIA 12/30/2007  . ANEMIA-NOS 02/09/2008  . CHEST PAIN 02/28/2009  . COLD SORE 10/01/2007  . COLONIC POLYPS, HX OF 02/26/2007  . Degenerative arthritis of left knee 05/18/2014  . DIABETES MELLITUS, TYPE II 11/29/2008  . ECZEMA, ATOPIC DERMATITIS 09/19/2006  . FATIGUE 09/05/2009  . GERD 02/09/2008  . GOITER NOS 09/19/2006  . GOUT 08/09/2008  . HYPERLIPIDEMIA 10/01/2007  . HYPERTENSION 02/26/2007  . INSOMNIA NOS 09/19/2006  . OTITIS EXTERNA, RIGHT 02/28/2009  . Pain in joint, lower leg 10/01/2007  . SINUSITIS- ACUTE-NOS 05/20/2009  . SKIN LESION 08/09/2008  . Syncope 11/06/2013  . Unspecified asthma(493.90) 09/19/2006  . Urinary frequency 11/29/2008  . Ventricular tachycardia, polymorphic ([place]) 01/04/2014  . Wheezing 12/30/2009   Past Surgical History:  Procedure Laterality Date  . ABDOMINAL HYSTERECTOMY  10/22/1999   BSO  . CHOLECYSTECTOMY  3.1.2004  . L RAS re-stented -  03/08/2005  . L RAS tx'd  w/angioplasty  11/21/2003  . left cataract surgury    . LEFT HEART CATHETERIZATION WITH CORONARY ANGIOGRAM N/A 01/07/2014   Procedure: LEFT HEART CATHETERIZATION WITH CORONARY ANGIOGRAM;  Surgeon: Peter M Martinique, MD;  [facility]: [facility] CATH LAB;  Service: Cardiovascular;  Laterality: N/A;  . radioactive iodine ablate  3/03   thyroid 10/07/2001  . [place] PROCEDURE  6/98 neg  . Split sleep study neg for sleep apnea  11/15/2003   Social History   Socioeconomic History  . Marital status: Married    Spouse name: None  . Number of children: 2  . Years of education: None  . Highest education level: None  Social Needs  . Financial resource strain: None  . Food insecurity - worry: None  . Food insecurity - inability: None  . Transportation needs - medical: None  . Transportation needs - non-medical: None  Occupational History  . Occupation: CNA    Employer: [employer]  Tobacco Use  . Smoking status: Never Smoker  . Smokeless tobacco: Never Used  Substance and Sexual Activity  . Alcohol use: No  . Drug use: No  . Sexual activity: None  Other Topics Concern  . None  Social History Narrative  . None   Allergies  Allergen Reactions  . Ace Inhibitors Other (See Comments)    Reaction:  Unknown   . Codeine Nausea And Vomiting and Rash  . Hydrocodone Nausea And Vomiting  . Tramadol Nausea And  Vomiting   Family History  Problem Relation Age of Onset  . Diabetes Mother   . Heart disease Father      Past medical history, social, surgical and family history all reviewed in electronic medical record.  No pertanent information unless stated regarding to the chief complaint.   Review of Systems:Review of systems updated and as accurate as of 08/07/17  No headache, visual changes, nausea, vomiting, diarrhea, constipation, dizziness, abdominal pain, skin rash, fevers, chills, night sweats, weight loss, swollen lymph nodes,chest pain, shortness of breath, mood changes.  Positive muscle aches  and joint swelling  Objective  Blood pressure (!) 142/78, pulse 82, height 5\' 3"  (1.6 m), weight 165 lb (74.8 kg), SpO2 98 %. Systems examined below as of 08/07/17   General: No apparent distress alert and oriented x3 mood and affect normal, dressed appropriately.  HEENT: Pupils equal, extraocular movements intact  Respiratory: Patient's speak in full sentences and does not appear short of breath  Cardiovascular: No lower extremity edema, non tender, no erythema  Skin: Warm dry intact with no signs of infection or rash on extremities or on axial skeleton.  Abdomen: Soft nontender  Neuro: Cranial nerves II through XII are intact, neurovascularly intact in all extremities with 2+ DTRs and 2+ pulses.  Lymph: No lymphadenopathy of posterior or anterior cervical chain or axillae bilaterally.  Gait severe antalgic gait walking with the aid of a cane MSK:  Non tender with full range of motion and good stability and symmetric strength and tone of shoulders, elbows, wrist, hip, and ankles bilaterally.  Knee: Bilateral valgus deformity noted. Large thigh to calf ratio.  Tender to palpation over medial and PF joint line.  ROM full in flexion and extension and lower leg rotation. instability with valgus force.  painful patellar compression. Patellar glide with moderate crepitus. Patellar and quadriceps tendons unremarkable. Hamstring and quadriceps strength is normal.   After informed written and verbal consent, patient was seated on exam table. Right knee was prepped with alcohol swab and utilizing anterolateral approach, patient's right knee space was injected with 4:1  marcaine 0.5%: Kenalog 40mg /dL.  Aspirated 30 cc of strawlike color fluid patient tolerated the procedure well without immediate complications.  After informed written and verbal consent, patient was seated on exam table. Left knee was prepped with alcohol swab and utilizing anterolateral approach, patient's left knee space was  injected with 4:1  marcaine 0.5%: Kenalog 40mg /dL. Patient tolerated the procedure well without immediate complications.    Impression and Recommendations:     This case required medical decision making of moderate complexity.      Note: This dictation was prepared with Dragon dictation along with smaller phrase technology. Any transcriptional errors that result from this process are unintentional.

## 2017-08-07 NOTE — Patient Instructions (Signed)
Good to see you  Bad arthritis of the knee.  Ice 20 minutes 2 times daily. Usually after activity and before bed. pennsaid pinkie amount topically 2 times daily as needed.  Injected both knees.  Over the counter get vitamin D 2000 IU daily  Tart cherry extract at night  I can repeat injection every 10 weeks if needed I do want ot see you again in 4 weeks though to make sure you are doing better O

## 2017-08-08 LAB — SYNOVIAL CELL COUNT + DIFF, W/ CRYSTALS
Basophils, %: 0 %
EOSINOPHILS-SYNOVIAL: 0 % (ref 0–2)
Lymphocytes-Synovial Fld: 30 % (ref 0–74)
MONOCYTE/MACROPHAGE: 32 % (ref 0–69)
Neutrophil, Synovial: 20 % (ref 0–24)
SYNOVIOCYTES, %: 18 % — AB (ref 0–15)
WBC, Synovial: 329 cells/uL — ABNORMAL HIGH (ref <150)

## 2017-08-08 LAB — TIQ-NTM

## 2017-08-21 ENCOUNTER — Other Ambulatory Visit: Payer: Self-pay | Admitting: Internal Medicine

## 2017-08-21 DIAGNOSIS — M170 Bilateral primary osteoarthritis of knee: Secondary | ICD-10-CM | POA: Diagnosis not present

## 2017-08-21 NOTE — Telephone Encounter (Signed)
Done erx 

## 2017-09-03 NOTE — Progress Notes (Signed)
[name] [place], [place] [address] Phone: [phone] Subjective:     CC: Bilateral knee pain  VZD:[id number]  Brooke [name] is a 67 y.o. female coming in with complaint of bilateral knee pain.  Severe osteoarthritic changes bilaterally.  Steroid injections 1 month ago.  Was to do home exercise, icing regimen, which activities to do.  Patient states no significant benefit.  Continues to have chronic pain.  Patient did not start the allopurinol.  Patient states and feels like she continues to have increasing instability.     Past Medical History:  Diagnosis Date  . ALOPECIA 12/30/2007  . ANEMIA-NOS 02/09/2008  . CHEST PAIN 02/28/2009  . COLD SORE 10/01/2007  . COLONIC POLYPS, HX OF 02/26/2007  . Degenerative arthritis of left knee 05/18/2014  . DIABETES MELLITUS, TYPE II 11/29/2008  . ECZEMA, ATOPIC DERMATITIS 09/19/2006  . FATIGUE 09/05/2009  . GERD 02/09/2008  . GOITER NOS 09/19/2006  . GOUT 08/09/2008  . HYPERLIPIDEMIA 10/01/2007  . HYPERTENSION 02/26/2007  . INSOMNIA NOS 09/19/2006  . OTITIS EXTERNA, RIGHT 02/28/2009  . Pain in joint, lower leg 10/01/2007  . SINUSITIS- ACUTE-NOS 05/20/2009  . SKIN LESION 08/09/2008  . Syncope 11/06/2013  . Unspecified asthma(493.90) 09/19/2006  . Urinary frequency 11/29/2008  . Ventricular tachycardia, polymorphic ([place]) 01/04/2014  . Wheezing 12/30/2009   Past Surgical History:  Procedure Laterality Date  . ABDOMINAL HYSTERECTOMY  10/22/1999   BSO  . CHOLECYSTECTOMY  3.1.2004  . L RAS re-stented -  03/08/2005  . L RAS tx'd w/angioplasty  11/21/2003  . left cataract surgury    . LEFT HEART CATHETERIZATION WITH CORONARY ANGIOGRAM N/A 01/07/2014   Procedure: LEFT HEART CATHETERIZATION WITH CORONARY ANGIOGRAM;  Surgeon: Peter M Martinique, MD;  Location: [facility] CATH LAB;  Service: Cardiovascular;  Laterality: N/A;  . radioactive iodine ablate  3/03   thyroid 10/07/2001  . [place] PROCEDURE  6/98 neg  . Split sleep study neg  for sleep apnea  11/15/2003   Social History   Socioeconomic History  . Marital status: Married    Spouse name: Not on file  . Number of children: 2  . Years of education: Not on file  . Highest education level: Not on file  Social Needs  . Financial resource strain: Not on file  . Food insecurity - worry: Not on file  . Food insecurity - inability: Not on file  . Transportation needs - medical: Not on file  . Transportation needs - non-medical: Not on file  Occupational History  . Occupation: CNA    Employer: [employer]  Tobacco Use  . Smoking status: Never Smoker  . Smokeless tobacco: Never Used  Substance and Sexual Activity  . Alcohol use: No  . Drug use: No  . Sexual activity: Not on file  Other Topics Concern  . Not on file  Social History Narrative  . Not on file   Allergies  Allergen Reactions  . Ace Inhibitors Other (See Comments)    Reaction:  Unknown   . Codeine Nausea And Vomiting and Rash  . Hydrocodone Nausea And Vomiting  . Tramadol Nausea And Vomiting   Family History  Problem Relation Age of Onset  . Diabetes Mother   . Heart disease Father      Past medical history, social, surgical and family history all reviewed in electronic medical record.  No pertanent information unless stated regarding to the chief complaint.   Review of Systems:Review of systems  updated and as accurate as of 09/04/17  No headache, visual changes, nausea, vomiting, diarrhea, constipation, dizziness, abdominal pain, skin rash, fevers, chills, night sweats, weight loss, swollen lymph nodes,chest pain, shortness of breath, mood changes.  With positive muscle aches, joint swelling and body aches.  Objective  Blood pressure (!) 164/94, pulse 83, height 5\' 3"  (1.6 m), weight 167 lb (75.8 kg), SpO2 97 %. Systems examined below as of 09/04/17   General: No apparent distress alert and oriented x3 mood and affect normal, dressed appropriately.  HEENT: Pupils equal, extraocular  movements intact  Respiratory: Patient's speak in full sentences and does not appear short of breath  Cardiovascular: No lower extremity edema, non tender, no erythema  Skin: Warm dry intact with no signs of infection or rash on extremities or on axial skeleton.  Abdomen: Soft nontender  Neuro: Cranial nerves II through XII are intact, neurovascularly intact in all extremities with 2+ DTRs and 2+ pulses.  Lymph: No lymphadenopathy of posterior or anterior cervical chain or axillae bilaterally.  Gait severely antalgic gait MSK:  tender with mild limited range of motion and good stability and symmetric strength and tone of shoulders, elbows, wrist, hip, and ankles bilaterally.  Severe arthritic changes of multiple joints.   Knee: Bilateral valgus deformity noted. Large thigh to calf ratio.  Trace effusion bilaterally Tender to palpation over medial and PF joint line.  ROM full in flexion and extension and lower leg rotation. instability with valgus force.  painful patellar compression. Patellar glide with moderate crepitus. Patellar and quadriceps tendons unremarkable. Hamstring and quadriceps strength is normal.    Impression and Recommendations:     This case required medical decision making of moderate complexity.      Note: This dictation was prepared with Dragon dictation along with smaller phrase technology. Any transcriptional errors that result from this process are unintentional.

## 2017-09-04 ENCOUNTER — Other Ambulatory Visit (INDEPENDENT_AMBULATORY_CARE_PROVIDER_SITE_OTHER): Payer: Medicare Other

## 2017-09-04 ENCOUNTER — Ambulatory Visit (INDEPENDENT_AMBULATORY_CARE_PROVIDER_SITE_OTHER): Payer: Medicare Other | Admitting: Family Medicine

## 2017-09-04 ENCOUNTER — Encounter: Payer: Self-pay | Admitting: Family Medicine

## 2017-09-04 VITALS — BP 164/94 | HR 83 | Ht 63.0 in | Wt 167.0 lb

## 2017-09-04 DIAGNOSIS — M2550 Pain in unspecified joint: Secondary | ICD-10-CM

## 2017-09-04 DIAGNOSIS — M170 Bilateral primary osteoarthritis of knee: Secondary | ICD-10-CM

## 2017-09-04 LAB — IBC PANEL
IRON: 48 ug/dL (ref 42–145)
SATURATION RATIOS: 10.5 % — AB (ref 20.0–50.0)
TRANSFERRIN: 326 mg/dL (ref 212.0–360.0)

## 2017-09-04 LAB — COMPREHENSIVE METABOLIC PANEL
ALBUMIN: 4.2 g/dL (ref 3.5–5.2)
ALT: 6 U/L (ref 0–35)
AST: 10 U/L (ref 0–37)
Alkaline Phosphatase: 78 U/L (ref 39–117)
BUN: 19 mg/dL (ref 6–23)
CHLORIDE: 106 meq/L (ref 96–112)
CO2: 25 meq/L (ref 19–32)
Calcium: 9.3 mg/dL (ref 8.4–10.5)
Creatinine, Ser: 1.04 mg/dL (ref 0.40–1.20)
GFR: 68.26 mL/min (ref >60.00)
Glucose, Bld: 108 mg/dL — ABNORMAL HIGH (ref 70–99)
POTASSIUM: 3.8 meq/L (ref 3.5–5.1)
SODIUM: 141 meq/L (ref 135–145)
Total Bilirubin: 0.3 mg/dL (ref 0.2–1.2)
Total Protein: 7.4 g/dL (ref 6.0–8.3)

## 2017-09-04 LAB — C-REACTIVE PROTEIN: CRP: 1.5 mg/dL (ref 0.5–20.0)

## 2017-09-04 LAB — CBC WITH DIFFERENTIAL/PLATELET
BASOS PCT: 0.9 % (ref 0.0–3.0)
Basophils Absolute: 0.1 10*3/uL (ref 0.0–0.1)
Eosinophils Absolute: 0.1 10*3/uL (ref 0.0–0.7)
Eosinophils Relative: 0.8 % (ref 0.0–5.0)
HEMATOCRIT: 36.2 % (ref 36.0–46.0)
Hemoglobin: 11.8 g/dL — ABNORMAL LOW (ref 12.0–15.0)
LYMPHS PCT: 24.2 % (ref 12.0–46.0)
Lymphs Abs: 1.8 10*3/uL (ref 0.7–4.0)
MCHC: 32.7 g/dL (ref 30.0–36.0)
MCV: 82.4 fl (ref 78.0–100.0)
Monocytes Absolute: 0.8 10*3/uL (ref 0.1–1.0)
Monocytes Relative: 10 % (ref 3.0–12.0)
NEUTROS ABS: 4.8 10*3/uL (ref 1.4–7.7)
Neutrophils Relative %: 64.1 % (ref 43.0–77.0)
PLATELETS: 260 10*3/uL (ref 150.0–400.0)
RBC: 4.39 Mil/uL (ref 3.87–5.11)
RDW: 15.6 % — AB (ref 11.5–15.5)
WBC: 7.5 10*3/uL (ref 4.0–10.5)

## 2017-09-04 LAB — SEDIMENTATION RATE: Sed Rate: 49 mm/hr — ABNORMAL HIGH (ref 0–30)

## 2017-09-04 LAB — URIC ACID: Uric Acid, Serum: 4.2 mg/dL (ref 2.4–7.0)

## 2017-09-04 LAB — VITAMIN D 25 HYDROXY (VIT D DEFICIENCY, FRACTURES): VITD: 14.8 ng/mL — AB (ref 30.00–100.00)

## 2017-09-04 NOTE — Assessment & Plan Note (Signed)
Spent  25 minutes with patient face-to-face and had greater than 50% of counseling including as described in assessment and plan.  Patient did not respond well to the injections.  Given topical anti-inflammatories.  We discussed possible custom bracing secondary to the large thigh to calf ratio which patient declined at the moment.  Patient encouraged to take allopurinol regularly with a history of gout.  Laboratory workup for the severe pain.  We will see if we can get prior approval for Visco supplementation.  Following up again in 2-3 weeks

## 2017-09-04 NOTE — Patient Instructions (Signed)
Good to see you  Lets get labs downstairs We will call you when we know if we can do the injections.  Allopurinol daily can help with the gout.  Make an appointment in 2 weeks and likely we will be able to do the injections.

## 2017-09-05 LAB — PTH, INTACT AND CALCIUM
CALCIUM: 9.5 mg/dL (ref 8.6–10.4)
PTH: 72 pg/mL — ABNORMAL HIGH (ref 14–64)

## 2017-09-05 LAB — ANGIOTENSIN CONVERTING ENZYME: Angiotensin-Converting Enzyme: 27 U/L (ref 9–67)

## 2017-09-05 LAB — ANA: ANA: NEGATIVE

## 2017-09-05 LAB — CALCIUM, IONIZED: Calcium, Ion: 5.2 mg/dL (ref 4.8–5.6)

## 2017-09-18 ENCOUNTER — Ambulatory Visit: Payer: Medicare Other | Admitting: Family Medicine

## 2017-09-19 ENCOUNTER — Other Ambulatory Visit: Payer: Self-pay | Admitting: Internal Medicine

## 2017-09-24 DIAGNOSIS — E119 Type 2 diabetes mellitus without complications: Secondary | ICD-10-CM | POA: Diagnosis not present

## 2017-10-08 NOTE — Progress Notes (Signed)
[name] [place], [place] [address] Phone: [phone] Subjective:    I'm seeing this patient by the request  of:    CC: Bilateral knee pain  QIO:[id number]  Brooke [name] is a 67 y.o. female coming in with complaint of bilateral knee pain.  Found to have arthritic changes.  Given injection August 07, 2017.  Patient states that she has been having problems with the right knee more than the left but that both are still bothering her quite a bit. She tried to put a brace on the right side but could not due to the swelling. Her pain is from the knee to the ankle, bilaterally. She states that she does not feel any sensations on the left thigh when she rubs her hand over her leg.       Past Medical History:  Diagnosis Date  . ALOPECIA 12/30/2007  . ANEMIA-NOS 02/09/2008  . CHEST PAIN 02/28/2009  . COLD SORE 10/01/2007  . COLONIC POLYPS, HX OF 02/26/2007  . Degenerative arthritis of left knee 05/18/2014  . DIABETES MELLITUS, TYPE II 11/29/2008  . ECZEMA, ATOPIC DERMATITIS 09/19/2006  . FATIGUE 09/05/2009  . GERD 02/09/2008  . GOITER NOS 09/19/2006  . GOUT 08/09/2008  . HYPERLIPIDEMIA 10/01/2007  . HYPERTENSION 02/26/2007  . INSOMNIA NOS 09/19/2006  . OTITIS EXTERNA, RIGHT 02/28/2009  . Pain in joint, lower leg 10/01/2007  . SINUSITIS- ACUTE-NOS 05/20/2009  . SKIN LESION 08/09/2008  . Syncope 11/06/2013  . Unspecified asthma(493.90) 09/19/2006  . Urinary frequency 11/29/2008  . Ventricular tachycardia, polymorphic ([place]) 01/04/2014  . Wheezing 12/30/2009   Past Surgical History:  Procedure Laterality Date  . ABDOMINAL HYSTERECTOMY  10/22/1999   BSO  . CHOLECYSTECTOMY  3.1.2004  . L RAS re-stented -  03/08/2005  . L RAS tx'd w/angioplasty  11/21/2003  . left cataract surgury    . LEFT HEART CATHETERIZATION WITH CORONARY ANGIOGRAM N/A 01/07/2014   Procedure: LEFT HEART CATHETERIZATION WITH CORONARY ANGIOGRAM;  Surgeon: Peter M Martinique, MD;  Location: [facility] CATH  LAB;  Service: Cardiovascular;  Laterality: N/A;  . radioactive iodine ablate  3/03   thyroid 10/07/2001  . [place] PROCEDURE  6/98 neg  . Split sleep study neg for sleep apnea  11/15/2003   Social History   Socioeconomic History  . Marital status: Married    Spouse name: Not on file  . Number of children: 2  . Years of education: Not on file  . Highest education level: Not on file  Social Needs  . Financial resource strain: Not on file  . Food insecurity - worry: Not on file  . Food insecurity - inability: Not on file  . Transportation needs - medical: Not on file  . Transportation needs - non-medical: Not on file  Occupational History  . Occupation: CNA    Employer: [employer]  Tobacco Use  . Smoking status: Never Smoker  . Smokeless tobacco: Never Used  Substance and Sexual Activity  . Alcohol use: No  . Drug use: No  . Sexual activity: Not on file  Other Topics Concern  . Not on file  Social History Narrative  . Not on file   Allergies  Allergen Reactions  . Ace Inhibitors Other (See Comments)    Reaction:  Unknown   . Codeine Nausea And Vomiting and Rash  . Hydrocodone Nausea And Vomiting  . Tramadol Nausea And Vomiting   Family History  Problem Relation Age of Onset  .  Diabetes Mother   . Heart disease Father      Past medical history, social, surgical and family history all reviewed in electronic medical record.  No pertanent information unless stated regarding to the chief complaint.   Review of Systems:Review of systems updated and as accurate as of 10/09/17  No headache, visual changes, nausea, vomiting, diarrhea, constipation, dizziness, abdominal pain, skin rash, fevers, chills, night sweats, weight loss, swollen lymph nodes,  chest pain, shortness of breath, mood changes.  Positive muscle aches, joint swelling, body aches  Objective  Blood pressure (!) 172/102, pulse 76, height 5\' 3"  (1.6 m), weight 170 lb (77.1 kg), SpO2 98 %. Systems examined  below as of 10/09/17   General: No apparent distress alert and oriented x3 mood and affect normal, dressed appropriately.  HEENT: Pupils lazy on the right Respiratory: Patient's speak in full sentences and does not appear short of breath  Cardiovascular: Possible lower extremity edema, non tender, no erythema  Skin: Warm dry intact with no signs of infection or rash on extremities or on axial skeleton.  Abdomen: Soft nontender  Neuro: Cranial nerves II through XII are intact, neurovascularly intact in all extremities with 2+ DTRs and 2+ pulses.  Lymph: No lymphadenopathy of posterior or anterior cervical chain or axillae bilaterally.  Gait antalgic gait walking with a cane MSK:  tender with with limited range of motion and good stability and symmetric strength and tone of shoulders, elbows, wrist, hip, and ankles bilaterally.  Knee: Bilateral valgus deformity noted. Large thigh to calf ratio.  Tender to palpation over medial and PF joint line.  ROM full in flexion and extension and lower leg rotation. instability with valgus force.  painful patellar compression. Patellar glide with moderate crepitus. Patellar and quadriceps tendons unremarkable. Hamstring and quadriceps strength 4 out of 5 and symmetric.    Impression and Recommendations:     This case required medical decision making of moderate complexity.      Note: This dictation was prepared with Dragon dictation along with smaller phrase technology. Any transcriptional errors that result from this process are unintentional.

## 2017-10-09 ENCOUNTER — Ambulatory Visit: Payer: Medicare Other | Admitting: Family Medicine

## 2017-10-09 ENCOUNTER — Ambulatory Visit (INDEPENDENT_AMBULATORY_CARE_PROVIDER_SITE_OTHER)
Admission: RE | Admit: 2017-10-09 | Discharge: 2017-10-09 | Disposition: A | Discharge status: Home / Self Care | Payer: Medicare Other | Source: Ambulatory Visit | Attending: Family Medicine | Admitting: Family Medicine

## 2017-10-09 ENCOUNTER — Encounter: Payer: Self-pay | Admitting: Family Medicine

## 2017-10-09 VITALS — BP 172/102 | HR 76 | Ht 63.0 in | Wt 170.0 lb

## 2017-10-09 DIAGNOSIS — M549 Dorsalgia, unspecified: Secondary | ICD-10-CM

## 2017-10-09 DIAGNOSIS — M170 Bilateral primary osteoarthritis of knee: Secondary | ICD-10-CM | POA: Diagnosis not present

## 2017-10-09 DIAGNOSIS — M545 Low back pain: Secondary | ICD-10-CM | POA: Diagnosis not present

## 2017-10-09 MED ORDER — GABAPENTIN 100 MG PO CAPS: 100.0000 mg | ORAL_CAPSULE | Freq: Every day | ORAL | 3 refills | Status: DC

## 2017-10-09 NOTE — Patient Instructions (Signed)
Good to see you  I am sorry you are still hurting.  Ice is your friend pennsaid pinkie amount topically 2 times daily as needed.   Gabapentin 100mg  at night Back xray downstairs We will call you when we get prior approval for the monovisc injection for your knees.

## 2017-10-09 NOTE — Assessment & Plan Note (Signed)
Degenerative arthritis noted.  Patient has had this and did not respond significantly well to conservative therapy at this time.  Continues to have antalgic gait.  Patient's pain is stopping her from activities.  Has been wearing the brace but feels that it is uncomfortable.  Patient would be a candidate for Visco supplementation and we will see if we can get potential prior authorization.  Depending on this we will decide if we can do the injections.  Patient does not want any surgical intervention.  Follow-up again with me in 4 weeks otherwise.

## 2017-11-01 ENCOUNTER — Other Ambulatory Visit: Payer: Self-pay | Admitting: *Deleted

## 2017-11-01 MED ORDER — GABAPENTIN 100 MG PO CAPS
100.0000 mg | ORAL_CAPSULE | Freq: Every day | ORAL | 1 refills | Status: DC
Start: 2017-11-01 — End: 2018-04-22

## 2017-11-14 ENCOUNTER — Other Ambulatory Visit: Payer: Self-pay | Admitting: Internal Medicine

## 2017-11-15 ENCOUNTER — Encounter: Payer: Self-pay | Admitting: Physician Assistant

## 2017-12-04 ENCOUNTER — Ambulatory Visit: Payer: Medicare Other | Admitting: Physician Assistant

## 2018-01-10 ENCOUNTER — Encounter: Payer: Self-pay | Admitting: Physician Assistant

## 2018-01-10 NOTE — Progress Notes (Signed)
Cardiology Office Note    Date:  01/14/2018  ID:  Brooke [name], DOB [birth year], MRN [id number] PCP:  Biagio Borg, MD  Cardiologist:  Fransico Him, MD   Chief Complaint: f/u VT  History of Present Illness:  Brooke [name] is a 67 y.o. female with history of PVCs, polymorphic VT, HTN, dyslipdemia, DM and syncope who presents for yearly follow-up.  She also has a history of polymorphic VT - Dr. Olin Pia note from 2015 indicates event monitor "demonstrated PVCs often occurring in couplets and 2 episodes of PVC triggered polymorphic ventricular tachycardia 1 occurred at night and was asymptomatic. It [sic] was associated with dizziness." Cardiac normal MRI 01/2014 with normal EF 73%.Lexiscan myoview 11/2013 showed no ischemia.Cardiac cath 12/2013 showed normal coronaries and normal LVEF. She was started on Toprol. Her polymorphic VT was felt to be triggered by PVC's. At last OV 11/2016 she had elevated blood pressure and was describing some atypical chest pain. Toprol was increased. Nuclear stress test 12/2016 showed small moderate intensity anteroapical defect that was fixed and felt consistent with apical thinning, low risk study, EF 62%. Last labs 08/2017 - CMET normal except glucose 108, Hgb 11.8, plt 260, 06/2017 LDL 88 (followed by PCP).  She returns for follow-up. She has been under a lot of stress helping to care for her husband. For 3 months now, about once a week she will have vague central chest discomfort without specific pattern, lasting 5 minutes. It sometimes happens when she exerts effort to lift her husband up. Sometimes she is able to do this without any issue at all. She also has noticed this is sometimes associated with dyspnea but not always. It goes away after about 5 minutes spontaneously. She also sometimes wakes up in the middle of the night with shortness of breath and has to sit up and catch her breath for a few moments. She has not had any syncope or near-syncope accompanying  this reminiscent of prior VT. Her blood pressure is running upper 140's at home. Recheck by me 160/90. Reports compliance with meds. She denies any LEE, weight gain, bleeding or abdominal distention. She has chronic knee issues and does not exercise. No recent travel, surgery, bedrest, personal or family history of blood clot.  Past Medical History:  Diagnosis Date  . ALOPECIA 12/30/2007  . ANEMIA-NOS 02/09/2008  . COLD SORE 10/01/2007  . COLONIC POLYPS, HX OF 02/26/2007  . Degenerative arthritis of left knee 05/18/2014  . DIABETES MELLITUS, TYPE II 11/29/2008  . ECZEMA, ATOPIC DERMATITIS 09/19/2006  . FATIGUE 09/05/2009  . GERD 02/09/2008  . GOITER NOS 09/19/2006  . GOUT 08/09/2008  . HYPERLIPIDEMIA 10/01/2007  . HYPERTENSION 02/26/2007  . INSOMNIA NOS 09/19/2006  . OTITIS EXTERNA, RIGHT 02/28/2009  . Pain in joint, lower leg 10/01/2007  . PVC's (premature ventricular contractions)   . SINUSITIS- ACUTE-NOS 05/20/2009  . SKIN LESION 08/09/2008  . Syncope 11/06/2013  . Unspecified asthma(493.90) 09/19/2006  . Urinary frequency 11/29/2008  . Ventricular tachycardia, polymorphic ([place]) 01/04/2014    Past Surgical History:  Procedure Laterality Date  . ABDOMINAL HYSTERECTOMY  10/22/1999   BSO  . CHOLECYSTECTOMY  3.1.2004  . L RAS re-stented -  03/08/2005  . L RAS tx'd w/angioplasty  11/21/2003  . left cataract surgury    . LEFT HEART CATHETERIZATION WITH CORONARY ANGIOGRAM N/A 01/07/2014   Procedure: LEFT HEART CATHETERIZATION WITH CORONARY ANGIOGRAM;  Surgeon: Peter M Martinique, MD;  Location: [facility] CATH LAB;  Service: Cardiovascular;  Laterality:  N/A;  . radioactive iodine ablate  3/03   thyroid 10/07/2001  . [place] PROCEDURE  6/98 neg  . Split sleep study neg for sleep apnea  11/15/2003    Current Medications: Current Meds  Medication Sig  . albuterol (PROVENTIL HFA;VENTOLIN HFA) 108 (90 Base) MCG/ACT inhaler Inhale 2 puffs into the lungs every 6 (six) hours as needed for wheezing.  Marland Kitchen allopurinol  (ZYLOPRIM) 100 MG tablet Take 1 tablet (100 mg total) by mouth daily.  Marland Kitchen amLODipine (NORVASC) 10 MG tablet Take 1 tablet (10 mg total) by mouth daily.  . citalopram (CELEXA) 10 MG tablet TAKE 1 TABLET BY MOUTH EVERY DAY  . colchicine 0.6 MG tablet Take 1 tablet (0.6 mg total) by mouth daily.  . fluticasone (FLOVENT HFA) 110 MCG/ACT inhaler Inhale 2 puffs into the lungs 2 (two) times daily.  Marland Kitchen gabapentin (NEURONTIN) 100 MG capsule Take 1 capsule (100 mg total) by mouth at bedtime.  . hydrochlorothiazide (HYDRODIURIL) 25 MG tablet Take 1 tablet (25 mg total) by mouth daily.  Marland Kitchen ibuprofen (ADVIL,MOTRIN) 800 MG tablet Take 1 tablet (800 mg total) by mouth 2 (two) times daily as needed.  Marland Kitchen KLOR-CON M20 20 MEQ tablet TAKE 1 TABLET BY MOUTH TWICE A DAY  . meclizine (ANTIVERT) 25 MG tablet Take 1 tablet (25 mg total) by mouth 3 (three) times daily as needed for dizziness.  . metFORMIN (GLUCOPHAGE-XR) 500 MG 24 hr tablet TAKE ONE TABLET BY MOUTH ONCE DAILY WITH  BREAKFAST  . metoprolol succinate (TOPROL-XL) 100 MG 24 hr tablet Take 1 tablet (100 mg total) by mouth daily. Take with or immediately following a meal.  . omeprazole (PRILOSEC) 20 MG capsule Take 1 capsule (20 mg total) by mouth 2 (two) times daily.  . simvastatin (ZOCOR) 40 MG tablet Take 1 tablet (40 mg total) by mouth at bedtime.  Marland Kitchen zolpidem (AMBIEN) 5 MG tablet TAKE 1 TABLET BY MOUTH EVERY DAY AT BEDTIME AS NEEDED FOR SLEEP     Allergies:   Ace inhibitors; Codeine; Hydrocodone; and Tramadol   Social History   Socioeconomic History  . Marital status: Married    Spouse name: Not on file  . Number of children: 2  . Years of education: Not on file  . Highest education level: Not on file  Occupational History  . Occupation: CNA    Employer: [employer]  . Financial resource strain: Not on file  . Food insecurity:    Worry: Not on file    Inability: Not on file  . Transportation needs:    Medical: Not on file     Non-medical: Not on file  Tobacco Use  . Smoking status: Never Smoker  . Smokeless tobacco: Never Used  Substance and Sexual Activity  . Alcohol use: No  . Drug use: No  . Sexual activity: Not on file  Lifestyle  . Physical activity:    Days per week: Not on file    Minutes per session: Not on file  . Stress: Not on file  Relationships  . Social connections:    Talks on phone: Not on file    Gets together: Not on file    Attends religious service: Not on file    Active member of club or organization: Not on file    Attends meetings of clubs or organizations: Not on file    Relationship status: Not on file  Other Topics Concern  . Not on file  Social History Narrative  .  Not on file     Family History:  The patient's family history includes Diabetes in her mother; Heart disease in her father.  ROS:   Please see the history of present illness.  All other systems are reviewed and otherwise negative.    PHYSICAL EXAM:   VS:  BP (!) 144/74   Pulse 69   Ht 5\' 3"  (1.6 m)   Wt 168 lb (76.2 kg)   SpO2 97%   BMI 29.76 kg/m   BMI: Body mass index is 29.76 kg/m. GEN: Well nourished, well developed AAF, in no acute distress HEENT: normocephalic, atraumatic Neck: no JVD, carotid bruits, or masses Cardiac: RRR; no murmurs, rubs, or gallops, no edema  Respiratory:  clear to auscultation bilaterally, normal work of breathing GI: soft, nontender, nondistended, + BS MS: no deformity or atrophy (right knee in joint brace) Skin: warm and dry, no rash Neuro:  Alert and Oriented x 3, Strength and sensation are intact, follows commands Psych: euthymic mood, full affect  Wt Readings from Last 3 Encounters:  01/14/18 168 lb (76.2 kg)  10/09/17 170 lb (77.1 kg)  09/04/17 167 lb (75.8 kg)      Studies/Labs Reviewed:   EKG:  EKG was ordered today and personally reviewed by me and demonstrates NSR 69bpm, left axis deviation, TWI I, avL, poor R wave progression, no significant change  from prior.  Recent Labs: 09/04/2017: ALT 6; BUN 19; Creatinine, Ser 1.04; Hemoglobin 11.8; Platelets 260.0; Potassium 3.8; Sodium 141   Lipid Panel    Component Value Date/Time   CHOL 155 07/19/2017 1005   TRIG 117.0 07/19/2017 1005   HDL 43.80 07/19/2017 1005   CHOLHDL 4 07/19/2017 1005   VLDL 23.4 07/19/2017 1005   LDLCALC 88 07/19/2017 1005   LDLDIRECT 139.1 09/05/2009 1052    Additional studies/ records that were reviewed today include: Summarized above.    ASSESSMENT & PLAN:   1. Chest discomfort and orthopnea - she is somewhat of a vague historian and has a hard time really nailing down the characteristics of her symptoms. Looking back in 2015 she had episodes of chest discomfort at that time as well when coronaries were OK. It has been several years since last structural assessment. Will update labs today to exclude metabolic abnormality. If OK, plan to titrate beta blocker and update 2D echo and cardiac CT to further evaluate. I considered CXR but the CT should also help [place] evaluate lung fields. I will likely plan to titrate beta blocker in the meantime in case she is having recurrent ventricular ectopy causing her symptoms. Warning sx/ER precautions reviewed. She is CP free today and has no signs of DVT/PE on exam. 2. Polymorphic VT/history of PVCs - quiescent by EKG today, will consider event monitoring if above workup is unremarkable and symptoms do not improve with med titration. 3. Essential HTN - elevated today. As above, will review labwork to make plans for med titration. 4. Dyslipidemia - followed by primary care. She is actually on both Zocor and amlodipine. Recommended max dose per FDA of Zocor while on amlodipine is 20mg  daily. I will await labs for creatinine then plan to forward to PCP for review.  Disposition: F/u with me/Dr. Radford Pax care team APP after above testing.  Medication Adjustments/Labs and Tests Ordered: Current medicines are reviewed at length with  the patient today.  Concerns regarding medicines are outlined above. Medication changes, Labs and Tests ordered today are summarized above and listed in the Patient Instructions accessible  in Encounters.   Signed, Charlie Pitter, PA-C  01/14/2018 8:26 AM    [address], [place], [place]  [address] Phone: [phone]; Fax: [phone]

## 2018-01-12 ENCOUNTER — Other Ambulatory Visit: Payer: Self-pay | Admitting: Internal Medicine

## 2018-01-13 ENCOUNTER — Other Ambulatory Visit: Payer: Self-pay | Admitting: Internal Medicine

## 2018-01-14 ENCOUNTER — Encounter: Payer: Self-pay | Admitting: Physician Assistant

## 2018-01-14 ENCOUNTER — Encounter (INDEPENDENT_AMBULATORY_CARE_PROVIDER_SITE_OTHER): Payer: Self-pay

## 2018-01-14 ENCOUNTER — Telehealth: Payer: Self-pay | Admitting: Internal Medicine

## 2018-01-14 ENCOUNTER — Ambulatory Visit: Payer: Medicare Other | Admitting: Physician Assistant

## 2018-01-14 VITALS — BP 144/74 | HR 69 | Ht 63.0 in | Wt 168.0 lb

## 2018-01-14 DIAGNOSIS — I10 Essential (primary) hypertension: Secondary | ICD-10-CM | POA: Diagnosis not present

## 2018-01-14 DIAGNOSIS — I493 Ventricular premature depolarization: Secondary | ICD-10-CM | POA: Diagnosis not present

## 2018-01-14 DIAGNOSIS — R0601 Orthopnea: Secondary | ICD-10-CM | POA: Diagnosis not present

## 2018-01-14 DIAGNOSIS — I472 Ventricular tachycardia: Secondary | ICD-10-CM | POA: Diagnosis not present

## 2018-01-14 DIAGNOSIS — R079 Chest pain, unspecified: Secondary | ICD-10-CM | POA: Diagnosis not present

## 2018-01-14 DIAGNOSIS — E785 Hyperlipidemia, unspecified: Secondary | ICD-10-CM

## 2018-01-14 DIAGNOSIS — I4729 Other ventricular tachycardia: Secondary | ICD-10-CM

## 2018-01-14 LAB — CBC
HEMOGLOBIN: 11.7 g/dL (ref 11.1–15.9)
Hematocrit: 35.7 % (ref 34.0–46.6)
MCH: 27.1 pg (ref 26.6–33.0)
MCHC: 32.8 g/dL (ref 31.5–35.7)
MCV: 83 fL (ref 79–97)
Platelets: 245 10*3/uL (ref 150–450)
RBC: 4.32 x10E6/uL (ref 3.77–5.28)
RDW: 14.9 % (ref 12.3–15.4)
WBC: 6.8 10*3/uL (ref 3.4–10.8)

## 2018-01-14 LAB — BASIC METABOLIC PANEL
BUN/Creatinine Ratio: 13 (ref 12–28)
BUN: 15 mg/dL (ref 8–27)
CALCIUM: 9.2 mg/dL (ref 8.7–10.3)
CO2: 23 mmol/L (ref 20–29)
Chloride: 103 mmol/L (ref 96–106)
Creatinine, Ser: 1.14 mg/dL — ABNORMAL HIGH (ref 0.57–1.00)
GFR, EST AFRICAN AMERICAN: 58 mL/min/{1.73_m2} — AB (ref >59)
GFR, EST NON AFRICAN AMERICAN: 50 mL/min/{1.73_m2} — AB (ref >59)
Glucose: 96 mg/dL (ref 65–99)
Potassium: 4 mmol/L (ref 3.5–5.2)
Sodium: 141 mmol/L (ref 134–144)

## 2018-01-14 LAB — PRO B NATRIURETIC PEPTIDE: NT-Pro BNP: 472 pg/mL — ABNORMAL HIGH (ref 0–301)

## 2018-01-14 LAB — HM DIABETES EYE EXAM

## 2018-01-14 MED ORDER — ROSUVASTATIN CALCIUM 20 MG PO TABS: 20.0000 mg | ORAL_TABLET | Freq: Every day | ORAL | 3 refills | Status: DC

## 2018-01-14 NOTE — Patient Instructions (Addendum)
Medication Instructions:  Your physician recommends that you continue on your current medications as directed. Please refer to the Current Medication list given to you today.   Labwork: TODAY:  BMET, CBC, & PRO BNP  Testing/Procedures: Your physician has requested that you have cardiac CT. Cardiac computed tomography (CT) is a painless test that uses an x-ray machine to take clear, detailed pictures of your heart. For further information please visit [facility]uge[facility]iesta[facility]tn. Please follow instruction sheet as given.   Please arrive at the [facility] main entrance of [facility] at xx:xx AM (30-45 minutes prior to test start time)  [facility] [place], [place] [address] [phone]  Proceed to the [facility] Radiology Department (First Floor).  Please follow these instructions carefully (unless otherwise directed):  Hold all erectile dysfunction medications at least 48 hours prior to test.  On the Night Before the Test: . Drink plenty of water. . Do not consume any caffeinated/decaffeinated beverages or chocolate 12 hours prior to your test. . Do not take any antihistamines 12 hours prior to your test. . If you take Metformin do not take 24 hours prior to test.  On the Day of the Test: . Drink plenty of water. Do not drink any water within one hour of the test. . Do not eat any food 4 hours prior to the test. . You may take your regular medications prior to the test. . HOLD Hydrochlorothiazide the morning of the test.  After the Test: . Drink plenty of water. . After receiving IV contrast, you may experience a mild flushed feeling. This is normal. . On occasion, you may experience a mild rash up to 24 hours after the test. This is not dangerous. If this occurs, you can take Benadryl 25 mg and increase your fluid intake. . If you experience trouble breathing, this can be serious. If it is severe call 911 IMMEDIATELY. If it is mild, please call  our office. . If you take any of these medications: Glipizide/Metformin, Avandament, Glucavance, please do not take 48 hours after completing test.   Your physician has requested that you have an echocardiogram. Echocardiography is a painless test that uses sound waves to create images of your heart. It provides your doctor with information about the size and shape of your heart and how well your heart's chambers and valves are working. This procedure takes approximately one hour. There are no restrictions for this procedure.    Follow-Up: Your physician recommends that you schedule a follow-up appointment in: 1-2 WEEKS AFTER TESTING IS COMPLETE WITH DAYNA DUNN, PA-C   Any Other Special Instructions Will Be Listed Below (If Applicable).  Cardiac CT A cardiac CT angiogram is a procedure to look at the heart and the area around the heart. It may be done to help find the cause of chest pains or other symptoms of heart disease. During this procedure, a large X-ray machine, called a CT scanner, takes detailed pictures of the heart and the surrounding area after a dye (contrast material) has been injected into blood vessels in the area. The procedure is also sometimes called a coronary CT angiogram, coronary artery scanning, or CTA. A cardiac CT angiogram allows the health care provider to see how well blood is flowing to and from the heart. The health care provider will be able to see if there are any problems, such as:  Blockage or narrowing of the coronary arteries in the heart.  Fluid around the heart.  Signs of weakness or disease in the muscles, valves, and tissues of the heart.  Tell a health care provider about:  Any allergies you have. This is especially important if you have had a previous allergic reaction to contrast dye.  All medicines you are taking, including vitamins, herbs, eye drops, creams, and over-the-counter medicines.  Any blood disorders you have.  Any surgeries you  have had.  Any medical conditions you have.  Whether you are pregnant or may be pregnant.  Any anxiety disorders, chronic pain, or other conditions you have that may increase your stress or prevent you from lying still. What are the risks? Generally, this is a safe procedure. However, problems may occur, including:  Bleeding.  Infection.  Allergic reactions to medicines or dyes.  Damage to other structures or organs.  Kidney damage from the dye or contrast that is used.  Increased risk of cancer from radiation exposure. This risk is low. Talk with your health care provider about: ? The risks and benefits of testing. ? How you can receive the lowest dose of radiation.  What happens before the procedure?  Wear comfortable clothing and remove any jewelry, glasses, dentures, and hearing aids.  Follow instructions from your health care provider about eating and drinking. This may include: ? For 12 hours before the test - avoid caffeine. This includes tea, coffee, soda, energy drinks, and diet pills. Drink plenty of water or other fluids that do not have caffeine in them. Being well-hydrated can prevent complications. ? For 4-6 hours before the test - stop eating and drinking. The contrast dye can cause nausea, but this is less likely if your stomach is empty.  Ask your health care provider about changing or stopping your regular medicines. This is especially important if you are taking diabetes medicines, blood thinners, or medicines to treat erectile dysfunction. What happens during the procedure?  Hair on your chest may need to be removed so that small sticky patches called electrodes can be placed on your chest. These will transmit information that helps to monitor your heart during the test.  An IV tube will be inserted into one of your veins.  You might be given a medicine to control your heart rate during the test. This will help to ensure that good images are obtained.  You  will be asked to lie on an exam table. This table will slide in and out of the CT machine during the procedure.  Contrast dye will be injected into the IV tube. You might feel warm, or you may get a metallic taste in your mouth.  You will be given a medicine (nitroglycerin) to relax (dilate) the arteries in your heart.  The table that you are lying on will move into the CT machine tunnel for the scan.  The person running the machine will give you instructions while the scans are being done. You may be asked to: ? Keep your arms above your head. ? Hold your breath. ? Stay very still, even if the table is moving.  When the scanning is complete, you will be moved out of the machine.  The IV tube will be removed. The procedure may vary among health care providers and hospitals. What happens after the procedure?  You might feel warm, or you may get a metallic taste in your mouth from the contrast dye.  You may have a headache from the nitroglycerin.  After the procedure, drink water or other fluids to wash (flush) the contrast  material out of your body.  Contact a health care provider if you have any symptoms of allergy to the contrast. These symptoms include: ? Shortness of breath. ? Rash or hives. ? A racing heartbeat.  Most people can return to their normal activities right after the procedure. Ask your health care provider what activities are safe for you.  It is up to you to get the results of your procedure. Ask your health care provider, or the department that is doing the procedure, when your results will be ready. Summary  A cardiac CT angiogram is a procedure to look at the heart and the area around the heart. It may be done to help find the cause of chest pains or other symptoms of heart disease.  During this procedure, a large X-ray machine, called a CT scanner, takes detailed pictures of the heart and the surrounding area after a dye (contrast material) has been injected  into blood vessels in the area.  Ask your health care provider about changing or stopping your regular medicines before the procedure. This is especially important if you are taking diabetes medicines, blood thinners, or medicines to treat erectile dysfunction.  After the procedure, drink water or other fluids to wash (flush) the contrast material out of your body. This information is not intended to replace advice given to you by your health care provider. Make sure you discuss any questions you have with your health care provider. Document Released: 06/21/2008 Document Revised: 05/28/2016 Document Reviewed: 05/28/2016 Elsevier Interactive Patient Education  2017 [place].    Echocardiogram An echocardiogram, or echocardiography, uses sound waves (ultrasound) to produce an image of your heart. The echocardiogram is simple, painless, obtained within a short period of time, and offers valuable information to your health care provider. The images from an echocardiogram can provide information such as:  Evidence of coronary artery disease (CAD).  Heart size.  Heart muscle function.  Heart valve function.  Aneurysm detection.  Evidence of a past heart attack.  Fluid buildup around the heart.  Heart muscle thickening.  Assess heart valve function.  Tell a health care provider about:  Any allergies you have.  All medicines you are taking, including vitamins, herbs, eye drops, creams, and over-the-counter medicines.  Any problems you or family members have had with anesthetic medicines.  Any blood disorders you have.  Any surgeries you have had.  Any medical conditions you have.  Whether you are pregnant or may be pregnant. What happens before the procedure? No special preparation is needed. Eat and drink normally. What happens during the procedure?  In order to produce an image of your heart, gel will be applied to your chest and a wand-like tool (transducer) will be  moved over your chest. The gel will help transmit the sound waves from the transducer. The sound waves will harmlessly bounce off your heart to allow the heart images to be captured in real-time motion. These images will then be recorded.  You may need an IV to receive a medicine that improves the quality of the pictures. What happens after the procedure? You may return to your normal schedule including diet, activities, and medicines, unless your health care provider tells you otherwise. This information is not intended to replace advice given to you by your health care provider. Make sure you discuss any questions you have with your health care provider. Document Released: 07/06/2000 Document Revised: 02/25/2016 Document Reviewed: 03/16/2013 Elsevier Interactive Patient Education  2017 [employer].  If  you need a refill on your cardiac medications before your next appointment, please call your pharmacy.

## 2018-01-14 NOTE — Telephone Encounter (Signed)
shirron to let pt know, that since she requires the amlodipine at the current dose, we need to change the zocor (simvastatin) to crestor 20 qd  - I will do rx

## 2018-01-15 ENCOUNTER — Telehealth: Payer: Self-pay | Admitting: *Deleted

## 2018-01-15 MED ORDER — METOPROLOL SUCCINATE ER 100 MG PO TB24: ORAL_TABLET | ORAL | 3 refills | Status: DC

## 2018-01-15 NOTE — Telephone Encounter (Signed)
-----   Message from Charlie Pitter, [place] sent at 01/14/2018  5:41 PM EDT ----- Please let patient know labs are stable. BNP is mildly elevated but not markedly so. Would advise to generally stick to lower sodium diet (aiming for maximum 2,000mg  per day) and staying hydrated but not to excess. In general about 48-64oz per day of all fluid intake would be an ideal maximum.  Let's increase Toprol from 100mg  daily to 1 tablet AM and 1/2 tablet PM as a trial in case she is having breakthrough ectopy causing her symptoms. Otherwise continue plan as discussed. Have her monitor BP at home and call if still running >801 systolic or >65 diastolic.  I will also cc to primary care to review Zocor given that she is on amlodipine as max dose per FDA of Zocor while on amlodipine is 20mg  daily. Lipids are followed by PCP. I would definitely leave amlodipine dose where it is.  Dayna Dunn PA-C

## 2018-01-15 NOTE — Telephone Encounter (Signed)
Left mess for patient to call back.  

## 2018-01-15 NOTE — Telephone Encounter (Signed)
Pt informed of below.  

## 2018-01-17 ENCOUNTER — Other Ambulatory Visit: Payer: Self-pay | Admitting: Internal Medicine

## 2018-01-19 ENCOUNTER — Other Ambulatory Visit: Payer: Self-pay

## 2018-01-19 ENCOUNTER — Emergency Department (HOSPITAL_COMMUNITY)
Admission: EM | Admit: 2018-01-19 | Discharge: 2018-01-19 | Disposition: A | Discharge status: Home / Self Care | Payer: Medicare Other | Attending: Emergency Medicine | Admitting: Emergency Medicine

## 2018-01-19 ENCOUNTER — Encounter (HOSPITAL_COMMUNITY): Payer: Self-pay | Admitting: Emergency Medicine

## 2018-01-19 DIAGNOSIS — R42 Dizziness and giddiness: Secondary | ICD-10-CM | POA: Insufficient documentation

## 2018-01-19 DIAGNOSIS — J45909 Unspecified asthma, uncomplicated: Secondary | ICD-10-CM | POA: Diagnosis not present

## 2018-01-19 DIAGNOSIS — I110 Hypertensive heart disease with heart failure: Secondary | ICD-10-CM | POA: Insufficient documentation

## 2018-01-19 DIAGNOSIS — Z79899 Other long term (current) drug therapy: Secondary | ICD-10-CM | POA: Diagnosis not present

## 2018-01-19 DIAGNOSIS — E119 Type 2 diabetes mellitus without complications: Secondary | ICD-10-CM | POA: Insufficient documentation

## 2018-01-19 DIAGNOSIS — I5030 Unspecified diastolic (congestive) heart failure: Secondary | ICD-10-CM | POA: Diagnosis not present

## 2018-01-19 DIAGNOSIS — Z7984 Long term (current) use of oral hypoglycemic drugs: Secondary | ICD-10-CM | POA: Diagnosis not present

## 2018-01-19 LAB — BASIC METABOLIC PANEL
ANION GAP: 8 (ref 5–15)
BUN: 16 mg/dL (ref 8–23)
CALCIUM: 9.5 mg/dL (ref 8.9–10.3)
CHLORIDE: 107 mmol/L (ref 98–111)
CO2: 24 mmol/L (ref 22–32)
Creatinine, Ser: 1.08 mg/dL — ABNORMAL HIGH (ref 0.44–1.00)
GFR calc Af Amer: >60 mL/min (ref >60)
GFR calc non Af Amer: 52 mL/min — ABNORMAL LOW (ref >60)
GLUCOSE: 97 mg/dL (ref 70–99)
POTASSIUM: 4 mmol/L (ref 3.5–5.1)
Sodium: 139 mmol/L (ref 135–145)

## 2018-01-19 LAB — URINALYSIS, ROUTINE W REFLEX MICROSCOPIC
Bilirubin Urine: NEGATIVE
GLUCOSE, UA: NEGATIVE mg/dL
KETONES UR: NEGATIVE mg/dL
Leukocytes, UA: NEGATIVE
NITRITE: POSITIVE — AB
Protein, ur: NEGATIVE mg/dL
Specific Gravity, Urine: 1.008 (ref 1.005–1.030)
pH: 7 (ref 5.0–8.0)

## 2018-01-19 LAB — CBC
HCT: 37.7 % (ref 36.0–46.0)
Hemoglobin: 12.2 g/dL (ref 12.0–15.0)
MCH: 26.9 pg (ref 26.0–34.0)
MCHC: 32.4 g/dL (ref 30.0–36.0)
MCV: 83.2 fL (ref 78.0–100.0)
Platelets: 304 10*3/uL (ref 150–400)
RBC: 4.53 MIL/uL (ref 3.87–5.11)
RDW: 14.8 % (ref 11.5–15.5)
WBC: 6.9 10*3/uL (ref 4.0–10.5)

## 2018-01-19 MED ORDER — CLONAZEPAM 0.5 MG PO TABS: 0.5000 mg | ORAL_TABLET | Freq: Two times a day (BID) | ORAL | 0 refills | Status: DC | PRN

## 2018-01-19 MED ORDER — METOPROLOL SUCCINATE ER 50 MG PO TB24
50.0000 mg | ORAL_TABLET | Freq: Once | ORAL | Status: AC
  Administered 2018-01-19: 50 mg via ORAL
  Filled 2018-01-19: qty 1

## 2018-01-19 MED ORDER — CLONAZEPAM 0.125 MG PO TBDP
0.2500 mg | ORAL_TABLET | Freq: Once | ORAL | Status: AC
  Administered 2018-01-19: 0.25 mg via ORAL
  Filled 2018-01-19: qty 2

## 2018-01-19 NOTE — Discharge Instructions (Addendum)
YOU MAY TAKE KLONOPIN IF YOUR VERTIGO BECOMES SEVERE. YOU CAN DO EXERCISES ATTACHED IN PAPERWORK TO HELP WITH DIZZINESS. **DO NOT TAKE KLONOPIN AND MECLIZINE TOGETHER** FOLLOW UP WITH NEUROLOGY AND YOUR PRIMARY CARE PROVIDER. RETURN TO ER IF ANY SUDDEN CHANGE IN SYMPTOMS OR NEW SYMPTOMS SUCH AS HEADACHE, VISION CHANGES, WEAKNESS, SPEECH PROBLEMS, OR STROKE-LIKE SYMPTOMS.

## 2018-01-19 NOTE — ED Triage Notes (Signed)
Patient here from home via EMS with complaints of vertigo. Hx of same. Takes meds no relief today.

## 2018-01-19 NOTE — ED Notes (Signed)
Pt aware urine sample is needed 

## 2018-01-19 NOTE — ED Notes (Signed)
Pt wheeled to the restroom °

## 2018-01-20 NOTE — ED Provider Notes (Signed)
[facility] DEPT Provider Note   CSN: [id number] Arrival date & time: 01/19/18  1237     History   Chief Complaint Chief Complaint  Patient presents with  . Dizziness  . Hypertension    HPI Brooke [name] is a 67 y.o. female.  67 year old female with past medical history including hypertension, hyperlipidemia, type 2 diabetes mellitus, gout who presents with dizziness.  Patient states that she has had 5 days of problems with dizziness that is worse when she turns her head or bends over.  She has a history of similar symptoms for which she has previously been evaluated, has a prescription for meclizine.  She has taken the meclizine several times including this morning without improvement of her symptoms.  She states that her current symptoms are similar to previous episodes of vertigo only today's episodes are more severe.  She has occasionally had double vision with the vertigo.  She had one episode of vomiting 2 days ago and one episode this morning.  She denies any fever, severe headache, or extremity weakness.  No tinnitus or hearing problems. She normally ambulates with cane, has had some balance problems with the vertigo.  The history is provided by the patient.  Dizziness  Hypertension     Past Medical History:  Diagnosis Date  . ALOPECIA 12/30/2007  . ANEMIA-NOS 02/09/2008  . COLD SORE 10/01/2007  . COLONIC POLYPS, HX OF 02/26/2007  . Degenerative arthritis of left knee 05/18/2014  . DIABETES MELLITUS, TYPE II 11/29/2008  . ECZEMA, ATOPIC DERMATITIS 09/19/2006  . FATIGUE 09/05/2009  . GERD 02/09/2008  . GOITER NOS 09/19/2006  . GOUT 08/09/2008  . HYPERLIPIDEMIA 10/01/2007  . HYPERTENSION 02/26/2007  . INSOMNIA NOS 09/19/2006  . OTITIS EXTERNA, RIGHT 02/28/2009  . Pain in joint, lower leg 10/01/2007  . PVC's (premature ventricular contractions)   . SINUSITIS- ACUTE-NOS 05/20/2009  . SKIN LESION 08/09/2008  . Syncope 11/06/2013  . Unspecified  asthma(493.90) 09/19/2006  . Urinary frequency 11/29/2008  . Ventricular tachycardia, polymorphic ([place]) 01/04/2014    Patient Active Problem List   Diagnosis Date Noted  . Degenerative arthritis of knee, bilateral 08/07/2017  . Depression with anxiety 07/19/2017  . Vertigo 01/12/2017  . Low back pain 01/12/2017  . SOB (shortness of breath) 12/02/2016  . Dysuria 09/12/2016  . Bilateral knee pain 02/15/2015  . Effusion of right knee 01/28/2015  . Asthma with exacerbation 11/09/2014  . Pronation deformity of both feet 09/29/2014  . Greater trochanteric bursitis of left hip 08/31/2014  . Degenerative arthritis of left knee 05/18/2014  . Ventricular tachycardia, polymorphic ([place]) 01/04/2014  . Abnormal MRI of head 12/04/2013  . Syncope 11/06/2013  . Chest pain 11/06/2013  . Headache(784.0) 11/06/2013  . Nausea with vomiting 11/06/2013  . Cough 08/28/2013  . Hypokalemia 08/18/2012  . Diastolic dysfunction 01/16/9484  . Leukocytosis 01/03/2011  . Toe pain 12/20/2010  . Encounter for well adult exam with abnormal findings 12/19/2010  . FATIGUE 09/05/2009  . Diabetes ([place]) 11/29/2008  . Gout 08/09/2008  . ANEMIA-NOS 02/09/2008  . GERD 02/09/2008  . ALOPECIA 12/30/2007  . Hyperlipidemia 10/01/2007  . Essential hypertension 02/26/2007  . COLONIC POLYPS, HX OF 02/26/2007  . GOITER NOS 09/19/2006  . Asthma 09/19/2006  . ECZEMA, ATOPIC DERMATITIS 09/19/2006  . Insomnia 09/19/2006    Past Surgical History:  Procedure Laterality Date  . ABDOMINAL HYSTERECTOMY  10/22/1999   BSO  . CHOLECYSTECTOMY  3.1.2004  . L RAS re-stented -  03/08/2005  .  L RAS tx'd w/angioplasty  11/21/2003  . left cataract surgury    . LEFT HEART CATHETERIZATION WITH CORONARY ANGIOGRAM N/A 01/07/2014   Procedure: LEFT HEART CATHETERIZATION WITH CORONARY ANGIOGRAM;  Surgeon: Peter M Martinique, MD;  Location: [facility] CATH LAB;  Service: Cardiovascular;  Laterality: N/A;  . radioactive iodine ablate  3/03   thyroid  10/07/2001  . [place] PROCEDURE  6/98 neg  . Split sleep study neg for sleep apnea  11/15/2003     OB History   None      Home Medications    Prior to Admission medications   Medication Sig Start Date End Date Taking? Authorizing Provider  albuterol (PROVENTIL HFA;VENTOLIN HFA) 108 (90 Base) MCG/ACT inhaler Inhale 2 puffs into the lungs every 6 (six) hours as needed for wheezing. 09/12/16  Yes Biagio Borg, MD  allopurinol (ZYLOPRIM) 100 MG tablet Take 1 tablet (100 mg total) by mouth daily. 07/19/17  Yes Biagio Borg, MD  amLODipine (NORVASC) 10 MG tablet Take 1 tablet (10 mg total) by mouth daily. 07/19/17  Yes Biagio Borg, MD  citalopram (CELEXA) 10 MG tablet TAKE 1 TABLET BY MOUTH EVERY DAY 01/13/18  Yes Biagio Borg, MD  gabapentin (NEURONTIN) 100 MG capsule Take 1 capsule (100 mg total) by mouth at bedtime. 11/01/17  Yes Lyndal Pulley, DO  hydrochlorothiazide (HYDRODIURIL) 25 MG tablet Take 1 tablet (25 mg total) by mouth daily. 07/19/17  Yes Biagio Borg, MD  ibuprofen (ADVIL,MOTRIN) 800 MG tablet TAKE 1 TABLET BY MOUTH 2 TIMES DAILY AS NEEDED. Patient taking differently: TAKE 1 TABLET BY MOUTH 2 TIMES DAILY AS NEEDED AS PAIN 01/17/18  Yes Biagio Borg, MD  KLOR-CON M20 20 MEQ tablet TAKE 1 TABLET BY MOUTH TWICE A DAY 01/13/18  Yes Biagio Borg, MD  meclizine (ANTIVERT) 25 MG tablet Take 1 tablet (25 mg total) by mouth 3 (three) times daily as needed for dizziness. 07/19/17  Yes Biagio Borg, MD  metFORMIN (GLUCOPHAGE-XR) 500 MG 24 hr tablet TAKE ONE TABLET BY MOUTH ONCE DAILY WITH  BREAKFAST 07/19/17  Yes Biagio Borg, MD  metoprolol succinate (TOPROL-XL) 100 MG 24 hr tablet Take 1 tablet by mouth in the a.m and 1/2 tablet by mouth in the p.m Take with or immediately following a meal. 01/15/18  Yes Dunn, Dayna N, PA-C  omeprazole (PRILOSEC) 20 MG capsule Take 1 capsule (20 mg total) by mouth 2 (two) times daily. 09/12/16  Yes Biagio Borg, MD  clonazePAM (KLONOPIN) 0.5 MG  tablet Take 1 tablet (0.5 mg total) by mouth 2 (two) times daily as needed (vertigo). 01/19/18   Shaynah Hund, Wenda Overland, MD  colchicine 0.6 MG tablet Take 1 tablet (0.6 mg total) by mouth daily. Patient not taking: Reported on 01/19/2018 09/12/16   Biagio Borg, MD  fluticasone [facility] HFA) 110 MCG/ACT inhaler Inhale 2 puffs into the lungs 2 (two) times daily. Patient not taking: Reported on 01/19/2018 09/12/16   Biagio Borg, MD  rosuvastatin (CRESTOR) 20 MG tablet Take 1 tablet (20 mg total) by mouth daily. 01/14/18   Biagio Borg, MD  zolpidem (AMBIEN) 5 MG tablet TAKE 1 TABLET BY MOUTH EVERY DAY AT BEDTIME AS NEEDED FOR SLEEP 08/21/17   Biagio Borg, MD    Family History Family History  Problem Relation Age of Onset  . Diabetes Mother   . Heart disease Father     Social History Social History   Tobacco Use  .  Smoking status: Never Smoker  . Smokeless tobacco: Never Used  Substance Use Topics  . Alcohol use: No  . Drug use: No     Allergies   Ace inhibitors; Codeine; Hydrocodone; and Tramadol   Review of Systems Review of Systems  Neurological: Positive for dizziness.   All other systems reviewed and are negative except that which was mentioned in HPI   Physical Exam Updated Vital Signs BP (!) 157/82   Pulse 69   Temp 98.8 F (37.1 C) (Oral)   Resp 20   SpO2 98%   Physical Exam  Constitutional: She is oriented to person, place, and time. She appears well-developed and well-nourished. No distress.  Awake, alert  HENT:  Head: Normocephalic and atraumatic.  Eyes: Pupils are equal, round, and reactive to light. Conjunctivae and EOM are normal.  Neck: Neck supple.  Cardiovascular: Normal rate, regular rhythm and normal heart sounds.  No murmur heard. Pulmonary/Chest: Effort normal and breath sounds normal. No respiratory distress.  Abdominal: Soft. Bowel sounds are normal. She exhibits no distension. There is no tenderness.  Musculoskeletal: She exhibits no  edema.  Brace on right knee  Neurological: She is alert and oriented to person, place, and time. She has normal reflexes. No cranial nerve deficit. She exhibits normal muscle tone.  Fluent speech, normal finger-to-nose testing, negative pronator drift 5/5 strength and normal sensation x all 4 extremities  Skin: Skin is warm and dry.  Psychiatric: She has a normal mood and affect. Judgment and thought content normal.  Nursing note and vitals reviewed.    ED Treatments / Results  Labs (all labs ordered are listed, but only abnormal results are displayed) Labs Reviewed  BASIC METABOLIC PANEL - Abnormal; Notable for the following components:      Result Value   Creatinine, Ser 1.08 (*)    GFR calc non Af Amer 52 (*)    All other components within normal limits  URINALYSIS, ROUTINE W REFLEX MICROSCOPIC - Abnormal; Notable for the following components:   Hgb urine dipstick SMALL (*)    Nitrite POSITIVE (*)    Bacteria, UA MANY (*)    All other components within normal limits  CBC  CBG MONITORING, ED    EKG EKG Interpretation  Date/Time:  Sunday January 19 2018 12:54:18 EDT Ventricular Rate:  64 PR Interval:    QRS Duration: 118 QT Interval:  418 QTC Calculation: 432 R Axis:   -41 Text Interpretation:  Sinus rhythm LVH with IVCD, LAD and secondary repol abnrm Baseline wander in lead(s) V6 similar to previous Confirmed by Theotis Burrow [phone]) on 01/19/2018 5:24:35 PM   Radiology No results found.  Procedures Procedures (including critical care time)  Medications Ordered in ED Medications  clonazepam (KLONOPIN) disintegrating tablet 0.25 mg (0.25 mg Oral Given 01/19/18 1820)  metoprolol succinate (TOPROL-XL) 24 hr tablet 50 mg (50 mg Oral Given 01/19/18 1820)  clonazepam (KLONOPIN) disintegrating tablet 0.25 mg (0.25 mg Oral Given 01/19/18 2048)     Initial Impression / Assessment and Plan / ED Course  I have reviewed the triage vital signs and the nursing  notes.  Pertinent labs that were available during my care of the patient were reviewed by me and considered in my medical decision making (see chart for details).    Neurologically intact on exam. EKG similar to previous. Basic lab work unremarkable. Gave dose of Klonopin for vertigo and later repeat dose.   On reassessment, the patient was sitting up, well-appearing, stated that she  felt improved and she wanted to go home.  I ambulated her in the hallway, she was able to slowly ambulate with one person assistance.  She feels comfortable going home and following up with outpatient providers.  I have encouraged her to schedule an appointment with her neurologist if her symptoms continue and I have extensively reviewed return precautions.  She voiced understanding and was discharged in satisfactory condition.  Final Clinical Impressions(s) / ED Diagnoses   Final diagnoses:  Vertigo    ED Discharge Orders        Ordered    clonazePAM (KLONOPIN) 0.5 MG tablet  2 times daily PRN     01/19/18 2203       Kallon Caylor, Wenda Overland, MD 01/20/18 [phone]

## 2018-01-21 ENCOUNTER — Encounter: Payer: Self-pay | Admitting: Internal Medicine

## 2018-01-21 ENCOUNTER — Ambulatory Visit (INDEPENDENT_AMBULATORY_CARE_PROVIDER_SITE_OTHER): Payer: Medicare Other | Admitting: Internal Medicine

## 2018-01-21 ENCOUNTER — Other Ambulatory Visit: Payer: Self-pay | Admitting: Internal Medicine

## 2018-01-21 VITALS — BP 158/94 | HR 77 | Temp 98.5°F | Ht 63.0 in | Wt 164.0 lb

## 2018-01-21 DIAGNOSIS — I10 Essential (primary) hypertension: Secondary | ICD-10-CM | POA: Diagnosis not present

## 2018-01-21 DIAGNOSIS — E119 Type 2 diabetes mellitus without complications: Secondary | ICD-10-CM

## 2018-01-21 DIAGNOSIS — Z0000 Encounter for general adult medical examination without abnormal findings: Secondary | ICD-10-CM

## 2018-01-21 DIAGNOSIS — Z23 Encounter for immunization: Secondary | ICD-10-CM | POA: Diagnosis not present

## 2018-01-21 DIAGNOSIS — R42 Dizziness and giddiness: Secondary | ICD-10-CM

## 2018-01-21 DIAGNOSIS — Z1231 Encounter for screening mammogram for malignant neoplasm of breast: Secondary | ICD-10-CM

## 2018-01-21 MED ORDER — IBUPROFEN 800 MG PO TABS: ORAL_TABLET | ORAL | 2 refills | Status: DC

## 2018-01-21 MED ORDER — METOPROLOL SUCCINATE ER 100 MG PO TB24: ORAL_TABLET | ORAL | 3 refills | Status: DC

## 2018-01-21 NOTE — Assessment & Plan Note (Signed)
stable overall by history and exam, recent data reviewed with pt, and pt to continue medical treatment as before,  to f/u any worsening symptoms or concerns Lab Results  Component Value Date   HGBA1C 5.8 07/19/2017  declines f/u lab today, will check next visit

## 2018-01-21 NOTE — Assessment & Plan Note (Signed)
Uncontrolled, to increase the toprol XL to 100 bid - o/w stable overall by history and exam, recent data reviewed with pt, and pt to continue medical treatment as before,  to f/u any worsening symptoms or concerns BP Readings from Last 3 Encounters:  01/21/18 (!) 158/94  01/19/18 (!) 157/82  01/14/18 (!) 144/74

## 2018-01-21 NOTE — Assessment & Plan Note (Signed)
To restart the antivert, also refer PT - vestibular rehab

## 2018-01-21 NOTE — Assessment & Plan Note (Signed)

## 2018-01-21 NOTE — Patient Instructions (Addendum)
You had the Pneumovax pneumonia shot today  OK to increase the toprol XL to 100 mg twice per day  You will be contacted regarding the referral for: Physical Therapy (vestibular rehabilitation)  Please continue all other medications as before, including restarting the antivert (meclizine) for nausea  Please have the pharmacy call with any other refills you may need.  Please continue your efforts at being more active, low cholesterol diet, and weight control.  You are otherwise up to date with prevention measures today.  Please keep your appointments with your specialists as you may have planned  Please return in 3 months, or sooner if needed

## 2018-01-21 NOTE — Progress Notes (Signed)
Subjective:    Patient ID: Brooke [name], female    DOB: Nov 15, 1950, 67 y.o.   MRN: [id number]  HPI  Here for wellness and f/u;  Overall doing ok;  Pt denies Chest pain, worsening SOB, DOE, wheezing, orthopnea, PND, worsening LE edema, palpitations, dizziness or syncope.  Pt denies neurological change such as new headache, facial or extremity weakness.  Pt denies polydipsia, polyuria, or low sugar symptoms. Pt states overall good compliance with treatment and medications, good tolerability, and has been trying to follow appropriate diet.  Pt denies worsening depressive symptoms, suicidal ideation or panic. No fever, night sweats, wt loss, loss of appetite, or other constitutional symptoms.  Pt states good ability with ADL's, has low fall risk, home safety reviewed and adequate, no other significant changes in hearing or vision, and only occasionally active with exercise. Vertigo flare now some improved, but persistent, asks for PT.  Had n/v with first antivert so stopped. .   BP has been elevated as well' BP Readings from Last 3 Encounters:  01/21/18 (!) 158/94  01/19/18 (!) 157/82  01/14/18 (!) 144/74  Due next month for eye f/u.   Pt continues to have recurring LBP and bilat knee pain without change in severity, bowel or bladder change, fever, wt loss,  worsening LE pain/numbness/weakness, gait change or falls.  Seen at ED recently. No new complaints Past Medical History:  Diagnosis Date  . ALOPECIA 12/30/2007  . ANEMIA-NOS 02/09/2008  . COLD SORE 10/01/2007  . COLONIC POLYPS, HX OF 02/26/2007  . Degenerative arthritis of left knee 05/18/2014  . DIABETES MELLITUS, TYPE II 11/29/2008  . ECZEMA, ATOPIC DERMATITIS 09/19/2006  . FATIGUE 09/05/2009  . GERD 02/09/2008  . GOITER NOS 09/19/2006  . GOUT 08/09/2008  . HYPERLIPIDEMIA 10/01/2007  . HYPERTENSION 02/26/2007  . INSOMNIA NOS 09/19/2006  . OTITIS EXTERNA, RIGHT 02/28/2009  . Pain in joint, lower leg 10/01/2007  . PVC's (premature ventricular  contractions)   . SINUSITIS- ACUTE-NOS 05/20/2009  . SKIN LESION 08/09/2008  . Syncope 11/06/2013  . Unspecified asthma(493.90) 09/19/2006  . Urinary frequency 11/29/2008  . Ventricular tachycardia, polymorphic ([place]) 01/04/2014   Past Surgical History:  Procedure Laterality Date  . ABDOMINAL HYSTERECTOMY  10/22/1999   BSO  . CHOLECYSTECTOMY  3.1.2004  . L RAS re-stented -  03/08/2005  . L RAS tx'd w/angioplasty  11/21/2003  . left cataract surgury    . LEFT HEART CATHETERIZATION WITH CORONARY ANGIOGRAM N/A 01/07/2014   Procedure: LEFT HEART CATHETERIZATION WITH CORONARY ANGIOGRAM;  Surgeon: Peter M Martinique, MD;  Location: [facility] CATH LAB;  Service: Cardiovascular;  Laterality: N/A;  . radioactive iodine ablate  3/03   thyroid 10/07/2001  . [place] PROCEDURE  6/98 neg  . Split sleep study neg for sleep apnea  11/15/2003    reports that she has never smoked. She has never used smokeless tobacco. She reports that she does not drink alcohol or use drugs. family history includes Diabetes in her mother; Heart disease in her father. Allergies  Allergen Reactions  . Ace Inhibitors Other (See Comments)    Reaction:  Unknown   . Codeine Nausea And Vomiting and Rash  . Hydrocodone Nausea And Vomiting  . Tramadol Nausea And Vomiting   Current Outpatient Medications on File Prior to Visit  Medication Sig Dispense Refill  . albuterol (PROVENTIL HFA;VENTOLIN HFA) 108 (90 Base) MCG/ACT inhaler Inhale 2 puffs into the lungs every 6 (six) hours as needed for wheezing. 1 Inhaler 11  . allopurinol (  ZYLOPRIM) 100 MG tablet Take 1 tablet (100 mg total) by mouth daily. 90 tablet 3  . amLODipine (NORVASC) 10 MG tablet Take 1 tablet (10 mg total) by mouth daily. 90 tablet 3  . citalopram (CELEXA) 10 MG tablet TAKE 1 TABLET BY MOUTH EVERY DAY 90 tablet 1  . clonazePAM (KLONOPIN) 0.5 MG tablet Take 1 tablet (0.5 mg total) by mouth 2 (two) times daily as needed (vertigo). 6 tablet 0  . colchicine 0.6 MG tablet Take 1  tablet (0.6 mg total) by mouth daily. 30 tablet 11  . fluticasone (FLOVENT HFA) 110 MCG/ACT inhaler Inhale 2 puffs into the lungs 2 (two) times daily. 12 g 12  . gabapentin (NEURONTIN) 100 MG capsule Take 1 capsule (100 mg total) by mouth at bedtime. 90 capsule 1  . hydrochlorothiazide (HYDRODIURIL) 25 MG tablet Take 1 tablet (25 mg total) by mouth daily. 90 tablet 3  . KLOR-CON M20 20 MEQ tablet TAKE 1 TABLET BY MOUTH TWICE A DAY 60 tablet 1  . meclizine (ANTIVERT) 25 MG tablet Take 1 tablet (25 mg total) by mouth 3 (three) times daily as needed for dizziness. 30 tablet 5  . metFORMIN (GLUCOPHAGE-XR) 500 MG 24 hr tablet TAKE ONE TABLET BY MOUTH ONCE DAILY WITH  BREAKFAST 90 tablet 3  . omeprazole (PRILOSEC) 20 MG capsule Take 1 capsule (20 mg total) by mouth 2 (two) times daily. 60 capsule 11  . rosuvastatin (CRESTOR) 20 MG tablet Take 1 tablet (20 mg total) by mouth daily. 90 tablet 3  . zolpidem (AMBIEN) 5 MG tablet TAKE 1 TABLET BY MOUTH EVERY DAY AT BEDTIME AS NEEDED FOR SLEEP 90 tablet 1   No current facility-administered medications on file prior to visit.    Review of Systems Constitutional: Negative for other unusual diaphoresis, sweats, appetite or weight changes HENT: Negative for other worsening hearing loss, ear pain, facial swelling, mouth sores or neck stiffness.   Eyes: Negative for other worsening pain, redness or other visual disturbance.  Respiratory: Negative for other stridor or swelling Cardiovascular: Negative for other palpitations or other chest pain  Gastrointestinal: Negative for worsening diarrhea or loose stools, blood in stool, distention or other pain Genitourinary: Negative for hematuria, flank pain or other change in urine volume.  Musculoskeletal: Negative for myalgias or other joint swelling.  Skin: Negative for other color change, or other wound or worsening drainage.  Neurological: Negative for other syncope or numbness. Hematological: Negative for other  adenopathy or swelling Psychiatric/Behavioral: Negative for hallucinations, other worsening agitation, SI, self-injury, or new decreased concentration All other system neg per pt    Objective:   Physical Exam BP (!) 158/94   Pulse 77   Temp 98.5 F (36.9 C) (Oral)   Ht 5\' 3"  (1.6 m)   Wt 164 lb (74.4 kg)   SpO2 94%   BMI 29.05 kg/m  VS noted,  Constitutional: Pt is oriented to person, place, and time. Appears well-developed and well-nourished, in no significant distress and comfortable Head: Normocephalic and atraumatic  Eyes: Conjunctivae and EOM are normal. Pupils are equal, round, and reactive to light Right Ear: External ear normal without discharge Left Ear: External ear normal without discharge Nose: Nose without discharge or deformity Mouth/Throat: Oropharynx is without other ulcerations and moist  Neck: Normal range of motion. Neck supple. No JVD present. No tracheal deviation present or significant neck LA or mass Cardiovascular: Normal rate, regular rhythm, normal heart sounds and intact distal pulses. Pulmonary/Chest: WOB normal and breath sounds  without rales or wheezing  Abdominal: Soft. Bowel sounds are normal. NT. No HSM  Musculoskeletal: Normal range of motion. Exhibits no edema Lymphadenopathy: Has no other cervical adenopathy.  Neurological: Pt is alert and oriented to person, place, and time. Pt has normal reflexes. No cranial nerve deficit. Motor grossly intact, Gait intact Skin: Skin is warm and dry. No rash noted or new ulcerations Psychiatric:  Has normal mood and affect. Behavior is normal without agitation No other exam findings  Lab Results  Component Value Date   HGBA1C 5.8 07/19/2017  Pt declines further labs today    Assessment & Plan:

## 2018-01-22 ENCOUNTER — Other Ambulatory Visit: Payer: Self-pay

## 2018-01-22 ENCOUNTER — Ambulatory Visit (HOSPITAL_COMMUNITY): Payer: Medicare Other | Attending: Internal Medicine

## 2018-01-22 DIAGNOSIS — I081 Rheumatic disorders of both mitral and tricuspid valves: Secondary | ICD-10-CM | POA: Diagnosis not present

## 2018-01-22 DIAGNOSIS — E785 Hyperlipidemia, unspecified: Secondary | ICD-10-CM | POA: Insufficient documentation

## 2018-01-22 DIAGNOSIS — R0601 Orthopnea: Secondary | ICD-10-CM | POA: Insufficient documentation

## 2018-01-22 DIAGNOSIS — I5030 Unspecified diastolic (congestive) heart failure: Secondary | ICD-10-CM | POA: Diagnosis not present

## 2018-01-22 DIAGNOSIS — I110 Hypertensive heart disease with heart failure: Secondary | ICD-10-CM | POA: Insufficient documentation

## 2018-02-05 ENCOUNTER — Ambulatory Visit: Payer: Medicare Other | Admitting: Family Medicine

## 2018-02-05 ENCOUNTER — Encounter: Payer: Self-pay | Admitting: Family Medicine

## 2018-02-05 DIAGNOSIS — M170 Bilateral primary osteoarthritis of knee: Secondary | ICD-10-CM | POA: Diagnosis not present

## 2018-02-05 DIAGNOSIS — R55 Syncope and collapse: Secondary | ICD-10-CM

## 2018-02-05 NOTE — Patient Instructions (Signed)
Good to see you  If chest pain  Or worse  fatigue is worse please go to emergency room I think a lot of it is your heart.  Stay active though but listen to your body  Tried the other injections in the knees today  At some point you will need a knee replacement but not til you fell like yourself.  See me again in 6 weeks

## 2018-02-05 NOTE — Assessment & Plan Note (Signed)
Failed all conservative therapy.  Has had some response to Visco supplementation and repeated again today.  We will give patient some benefit.  Encourage patient to continue to wear the brace as possible.  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  Patient will consider this.  Follow-up again in 4 weeks

## 2018-02-05 NOTE — Assessment & Plan Note (Signed)
Patient is following up with her cardiologist and declined any type of EKG today or any other work-up.  Patient encouraged in any worsening pain to seek medical attention immediately especially the emergency room as a possibility

## 2018-02-05 NOTE — Progress Notes (Signed)
[name] [address], [place] [address] Phone: [phone] Subjective:       CC: Bilateral knee pain  BDZ:[id number]  Deem Marmol Brooke [name] is a 67 y.o. female coming in with complaint of bilateral knee pain. Patient has been having pain for the past couple of months. She feels like the left leg is weak. Patient has fallen multiple times in the last week on her knees due to vertigo. Is wearing a medial unloader on the right leg. States that the brace does cause burning.  Patient is having worsening fatigue as well.  Follow-up with her cardiologist last week and is scheduled to have other tests including a CT angiogram     Past Medical History:  Diagnosis Date  . ALOPECIA 12/30/2007  . ANEMIA-NOS 02/09/2008  . COLD SORE 10/01/2007  . COLONIC POLYPS, HX OF 02/26/2007  . Degenerative arthritis of left knee 05/18/2014  . DIABETES MELLITUS, TYPE II 11/29/2008  . ECZEMA, ATOPIC DERMATITIS 09/19/2006  . FATIGUE 09/05/2009  . GERD 02/09/2008  . GOITER NOS 09/19/2006  . GOUT 08/09/2008  . HYPERLIPIDEMIA 10/01/2007  . HYPERTENSION 02/26/2007  . INSOMNIA NOS 09/19/2006  . OTITIS EXTERNA, RIGHT 02/28/2009  . Pain in joint, lower leg 10/01/2007  . PVC's (premature ventricular contractions)   . SINUSITIS- ACUTE-NOS 05/20/2009  . SKIN LESION 08/09/2008  . Syncope 11/06/2013  . Unspecified asthma(493.90) 09/19/2006  . Urinary frequency 11/29/2008  . Ventricular tachycardia, polymorphic ([place]) 01/04/2014   Past Surgical History:  Procedure Laterality Date  . ABDOMINAL HYSTERECTOMY  10/22/1999   BSO  . CHOLECYSTECTOMY  3.1.2004  . L RAS re-stented -  03/08/2005  . L RAS tx'd w/angioplasty  11/21/2003  . left cataract surgury    . LEFT HEART CATHETERIZATION WITH CORONARY ANGIOGRAM N/A 01/07/2014   Procedure: LEFT HEART CATHETERIZATION WITH CORONARY ANGIOGRAM;  Surgeon: Peter M Martinique, MD;  Location: [facility] CATH LAB;  Service: Cardiovascular;  Laterality: N/A;  . radioactive iodine  ablate  3/03   thyroid 10/07/2001  . [place] PROCEDURE  6/98 neg  . Split sleep study neg for sleep apnea  11/15/2003   Social History   Socioeconomic History  . Marital status: Married    Spouse name: Not on file  . Number of children: 2  . Years of education: Not on file  . Highest education level: Not on file  Occupational History  . Occupation: CNA    Employer: [employer]  . Financial resource strain: Not on file  . Food insecurity:    Worry: Not on file    Inability: Not on file  . Transportation needs:    Medical: Not on file    Non-medical: Not on file  Tobacco Use  . Smoking status: Never Smoker  . Smokeless tobacco: Never Used  Substance and Sexual Activity  . Alcohol use: No  . Drug use: No  . Sexual activity: Not on file  Lifestyle  . Physical activity:    Days per week: Not on file    Minutes per session: Not on file  . Stress: Not on file  Relationships  . Social connections:    Talks on phone: Not on file    Gets together: Not on file    Attends religious service: Not on file    Active member of club or organization: Not on file    Attends meetings of clubs or organizations: Not on file    Relationship status: Not on  file  Other Topics Concern  . Not on file  Social History Narrative  . Not on file   Allergies  Allergen Reactions  . Ace Inhibitors Other (See Comments)    Reaction:  Unknown   . Codeine Nausea And Vomiting and Rash  . Hydrocodone Nausea And Vomiting  . Tramadol Nausea And Vomiting   Family History  Problem Relation Age of Onset  . Diabetes Mother   . Heart disease Father      Past medical history, social, surgical and family history all reviewed in electronic medical record.  No pertanent information unless stated regarding to the chief complaint.   Review of Systems:Review of systems updated and as accurate as of 02/05/18  No headache, visual changes, nausea, vomiting, diarrhea, constipation, dizziness,  abdominal pain, skin rash, fevers, chills, night sweats, weight loss, swollen lymph nodes, body aches,chest pain, shortness of breath, mood changes.  Positive muscle aches and joint swelling, positive fatigue  Objective  Blood pressure (!) 168/82, pulse 73, height 5\' 3"  (1.6 m), weight 165 lb (74.8 kg), SpO2 98 %. Systems examined below as of 02/05/18   General: No apparent distress alert and oriented x3 mood and affect normal, dressed appropriately.  HEENT: Asymmetric Respiratory: Patient's speak in full sentences and does not appear short of breath  Cardiovascular: Trace lower extremity edema, non tender, no erythema  Skin: Warm dry intact with no signs of infection or rash on extremities or on axial skeleton.  Abdomen: Soft nontender  Neuro: Cranial nerves II through XII are intact, neurovascularly intact in all extremities with 2+ DTRs and 2+ pulses.  Lymph: No lymphadenopathy of posterior or anterior cervical chain or axillae bilaterally.  Gait antalgic gait MSK:  Non tender with full range of motion and good stability and symmetric strength and tone of shoulders, elbows, wrist, hip, and ankles bilaterally.  Knee: Bilateral valgus deformity noted. Large thigh to calf ratio.  Tender to palpation over medial and PF joint line.  ROM full in flexion and extension and lower leg rotation. instability with valgus force.  painful patellar compression. Patellar glide with moderate crepitus. Patellar and quadriceps tendons unremarkable. Hamstring and quadriceps strength is normal.   After informed written and verbal consent, patient was seated on exam table. Right knee was prepped with alcohol swab and utilizing anterolateral approach, patient's right knee space was injected with22mg /mL of Monovisc(sodium hyaluronate) in a prefilled syringe was injected easily into the knee through a 22-gauge needle..Patient tolerated the procedure well without immediate complications.  After informed written  and verbal consent, patient was seated on exam table. Left knee was prepped with alcohol swab and utilizing anterolateral approach, patient's left knee space was injected with22mg /mL of Monoviscc(sodium hyaluronate) in a prefilled syringe was injected easily into the knee through a 22-gauge needle..Patient tolerated the procedure well without immediate complications.    Impression and Recommendations:     This case required medical decision making of moderate complexity.      Note: This dictation was prepared with Dragon dictation along with smaller phrase technology. Any transcriptional errors that result from this process are unintentional.

## 2018-02-13 ENCOUNTER — Other Ambulatory Visit: Payer: Self-pay | Admitting: Internal Medicine

## 2018-02-13 DIAGNOSIS — H2702 Aphakia, left eye: Secondary | ICD-10-CM | POA: Diagnosis not present

## 2018-02-13 DIAGNOSIS — H31003 Unspecified chorioretinal scars, bilateral: Secondary | ICD-10-CM | POA: Diagnosis not present

## 2018-02-13 DIAGNOSIS — H52203 Unspecified astigmatism, bilateral: Secondary | ICD-10-CM | POA: Diagnosis not present

## 2018-02-13 DIAGNOSIS — E119 Type 2 diabetes mellitus without complications: Secondary | ICD-10-CM | POA: Diagnosis not present

## 2018-02-18 ENCOUNTER — Encounter: Payer: Self-pay | Admitting: Neurology

## 2018-02-18 ENCOUNTER — Other Ambulatory Visit: Payer: Self-pay | Admitting: *Deleted

## 2018-02-18 DIAGNOSIS — Z79899 Other long term (current) drug therapy: Secondary | ICD-10-CM

## 2018-02-18 NOTE — Progress Notes (Signed)
Met

## 2018-02-25 ENCOUNTER — Ambulatory Visit
Admission: RE | Admit: 2018-02-25 | Discharge: 2018-02-25 | Disposition: A | Discharge status: Home / Self Care | Payer: Medicare Other | Source: Ambulatory Visit | Attending: Internal Medicine | Admitting: Internal Medicine

## 2018-02-25 DIAGNOSIS — Z1231 Encounter for screening mammogram for malignant neoplasm of breast: Secondary | ICD-10-CM | POA: Diagnosis not present

## 2018-02-26 ENCOUNTER — Ambulatory Visit: Payer: Medicare Other | Admitting: Neurology

## 2018-02-26 ENCOUNTER — Encounter: Payer: Self-pay | Admitting: Neurology

## 2018-02-26 ENCOUNTER — Other Ambulatory Visit: Payer: Self-pay | Admitting: Internal Medicine

## 2018-02-26 VITALS — BP 146/82 | HR 71 | Ht 63.0 in | Wt 164.0 lb

## 2018-02-26 DIAGNOSIS — R9082 White matter disease, unspecified: Secondary | ICD-10-CM | POA: Diagnosis not present

## 2018-02-26 DIAGNOSIS — H8110 Benign paroxysmal vertigo, unspecified ear: Secondary | ICD-10-CM | POA: Diagnosis not present

## 2018-02-26 DIAGNOSIS — R928 Other abnormal and inconclusive findings on diagnostic imaging of breast: Secondary | ICD-10-CM

## 2018-02-26 NOTE — Patient Instructions (Signed)
1.  I will refer you to physical therapy for vestibular rehab (to help cure the dizziness) 2.  Follow up afterwards.

## 2018-02-26 NOTE — Progress Notes (Signed)
NEUROLOGY FOLLOW UP OFFICE NOTE  Brooke [name] [id number]  HISTORY OF PRESENT ILLNESS: Brooke [name] is a 67 year old right-handed woman with history of nonsustained ventricular tachycardia-polymorphic syncope, hypertension, hyperlipidemia, type II diabetes mellitus, goiter, GERD, anemia and fatigue follows up for vertigo.   UPDATE: She had another occurrence of vertigo.  While the prior episode from 2015 lasted 2 days, this episode lasted 30 days.  She reports spinning sensation associated with nausea precipitated when laying down or moving head from either side.  Episodes last a couple of minutes.  There is no associated headache, visual disturbance, or unilateral numbness or weakness.  She presented to the ED on 01/19/18 for further evaluation.  EKG was stable.  CBC and BMP were unremarkable.  She was treated with Klonopin and advised to follow up with neurology.  Klonopin makes her ill.  It has overall calmed down but still feels dizzy when laying down.     HISTORY: On 09/17/13, she had lost consciousness while at work.  She was unconscious for 5 minutes and woke up on the floor.  She had to crawl for help.  Following this, she began having recurrent headache and chest pain radiating into the right arm.  She was evaluated by cardiology for syncope.  She continued to have recurrent episodes of dizziness.  Initially they were associated with nausea, vomiting and diarrhea, but not any longer.  They lasted 10 minutes and occured daily.  They occured spontaneously and were not positional.  If she is standing during a spell, she becomes off balance and will veer towards the right.  She denies difficulty using her right hand.  She denies focal weakness, numbness.  She was subsequently found to have nonsustained polymorphic ventricular tachycardia.  MRI of the brain without contrast was performed on 11/20/13, revealing 1.3 cm region of increased FLAIR and T2 signal with mild swelling at the junction of the  right lateral pons and right middle cerebellar peduncle and without restricted diffusion.  Impression discussed possibility of MS. She also had few foci of signal abnormality in the cerebral hemispheres as well.  MRI of the brain with and without contrast performed on 01/12/14, again showing hyperintensity within the right anterior pons at the level of the right fifth cranial nerve root entry zone without abnormal enhancement.  There was also noted small cerebral spinal fluid collection within the sella, possibly focal area of herniation of the dura into the pituitary gland versus primary pituitary cystic lesion.  MRA of the head revealed some intracranial atherosclerotic changes, including mild to moderate narrowing of the right AICA, not felt to be responsible for the right pontine signal abnormality.  She denies prior episodes of neurologic disturbance such as unilateral numbness or weakness, transient vision loss or ataxia.  To evaluate vertigo, she had an MRI of brain with and without contrast on 12/27/16 which again demonstrated T2 and FLAIR hyperintensity in the right lateral pons (as demonstrated on prior MRI, although less prominent now), as well as T2 and FLAIR hyperintensities in the periventricular and subcortical white matter, all without any abnormal enhancement, stable compared to prior imaging from 2015.  MRI of cervical spine from 02/10/17 personally reviewed and showed no cord abnormality to suggest MS.  No further workup was recommended.     There is no family history of MS. Her son does have a diagnosis of neurofibromatosis.  PAST MEDICAL HISTORY: Past Medical History:  Diagnosis Date  . ALOPECIA 12/30/2007  . ANEMIA-NOS 02/09/2008  .  COLD SORE 10/01/2007  . COLONIC POLYPS, HX OF 02/26/2007  . Degenerative arthritis of left knee 05/18/2014  . DIABETES MELLITUS, TYPE II 11/29/2008  . ECZEMA, ATOPIC DERMATITIS 09/19/2006  . FATIGUE 09/05/2009  . GERD 02/09/2008  . GOITER NOS 09/19/2006  . GOUT  08/09/2008  . HYPERLIPIDEMIA 10/01/2007  . HYPERTENSION 02/26/2007  . INSOMNIA NOS 09/19/2006  . OTITIS EXTERNA, RIGHT 02/28/2009  . Pain in joint, lower leg 10/01/2007  . PVC's (premature ventricular contractions)   . SINUSITIS- ACUTE-NOS 05/20/2009  . SKIN LESION 08/09/2008  . Syncope 11/06/2013  . Unspecified asthma(493.90) 09/19/2006  . Urinary frequency 11/29/2008  . Ventricular tachycardia, polymorphic ([place]) 01/04/2014    MEDICATIONS: Current Outpatient Medications on File Prior to Visit  Medication Sig Dispense Refill  . albuterol (PROVENTIL HFA;VENTOLIN HFA) 108 (90 Base) MCG/ACT inhaler Inhale 2 puffs into the lungs every 6 (six) hours as needed for wheezing. 1 Inhaler 11  . allopurinol (ZYLOPRIM) 100 MG tablet Take 1 tablet (100 mg total) by mouth daily. 90 tablet 3  . amLODipine (NORVASC) 10 MG tablet Take 1 tablet (10 mg total) by mouth daily. 90 tablet 3  . citalopram (CELEXA) 10 MG tablet TAKE 1 TABLET BY MOUTH EVERY DAY 90 tablet 1  . clonazePAM (KLONOPIN) 0.5 MG tablet Take 1 tablet (0.5 mg total) by mouth 2 (two) times daily as needed (vertigo). (Patient not taking: Reported on 02/26/2018) 6 tablet 0  . colchicine 0.6 MG tablet Take 1 tablet (0.6 mg total) by mouth daily. 30 tablet 11  . fluticasone (FLOVENT HFA) 110 MCG/ACT inhaler Inhale 2 puffs into the lungs 2 (two) times daily. 12 g 12  . gabapentin (NEURONTIN) 100 MG capsule Take 1 capsule (100 mg total) by mouth at bedtime. 90 capsule 1  . hydrochlorothiazide (HYDRODIURIL) 25 MG tablet Take 1 tablet (25 mg total) by mouth daily. 90 tablet 3  . ibuprofen (ADVIL,MOTRIN) 800 MG tablet TAKE 1 TABLET BY MOUTH 2 TIMES DAILY AS NEEDED AS PAIN 60 tablet 2  . KLOR-CON M20 20 MEQ tablet TAKE 1 TABLET BY MOUTH TWICE A DAY 60 tablet 11  . meclizine (ANTIVERT) 25 MG tablet Take 1 tablet (25 mg total) by mouth 3 (three) times daily as needed for dizziness. 30 tablet 5  . metFORMIN (GLUCOPHAGE-XR) 500 MG 24 hr tablet TAKE ONE TABLET BY MOUTH  ONCE DAILY WITH  BREAKFAST 90 tablet 3  . metoprolol succinate (TOPROL-XL) 100 MG 24 hr tablet Take 1 tablet by mouth in the a.m and 1 tablet by mouth in the p.m Take with or immediately following a meal. 180 tablet 3  . omeprazole (PRILOSEC) 20 MG capsule Take 1 capsule (20 mg total) by mouth 2 (two) times daily. 60 capsule 11  . rosuvastatin (CRESTOR) 20 MG tablet Take 1 tablet (20 mg total) by mouth daily. 90 tablet 3  . zolpidem (AMBIEN) 5 MG tablet TAKE 1 TABLET BY MOUTH EVERY DAY AT BEDTIME AS NEEDED FOR SLEEP 90 tablet 1   No current facility-administered medications on file prior to visit.     ALLERGIES: Allergies  Allergen Reactions  . Ace Inhibitors Other (See Comments)    Reaction:  Unknown   . Codeine Nausea And Vomiting and Rash  . Hydrocodone Nausea And Vomiting  . Tramadol Nausea And Vomiting    FAMILY HISTORY: Family History  Problem Relation Age of Onset  . Diabetes Mother   . Heart disease Father     SOCIAL HISTORY: Social History   Socioeconomic  History  . Marital status: Married    Spouse name: Not on file  . Number of children: 2  . Years of education: Not on file  . Highest education level: Not on file  Occupational History  . Occupation: CNA    Employer: [employer]  . Financial resource strain: Not on file  . Food insecurity:    Worry: Not on file    Inability: Not on file  . Transportation needs:    Medical: Not on file    Non-medical: Not on file  Tobacco Use  . Smoking status: Never Smoker  . Smokeless tobacco: Never Used  Substance and Sexual Activity  . Alcohol use: No  . Drug use: No  . Sexual activity: Not on file  Lifestyle  . Physical activity:    Days per week: Not on file    Minutes per session: Not on file  . Stress: Not on file  Relationships  . Social connections:    Talks on phone: Not on file    Gets together: Not on file    Attends religious service: Not on file    Active member of club or  organization: Not on file    Attends meetings of clubs or organizations: Not on file    Relationship status: Not on file  . Intimate partner violence:    Fear of current or ex partner: Not on file    Emotionally abused: Not on file    Physically abused: Not on file    Forced sexual activity: Not on file  Other Topics Concern  . Not on file  Social History Narrative  . Not on file    REVIEW OF SYSTEMS: Constitutional: No fevers, chills, or sweats, no generalized fatigue, change in appetite Eyes: No new visual changes, double vision, eye pain Ear, nose and throat: No hearing loss, ear pain, nasal congestion, sore throat Cardiovascular: No chest pain, palpitations Respiratory:  No shortness of breath at rest or with exertion, wheezes GastrointestinaI: No nausea, vomiting, diarrhea, abdominal pain, fecal incontinence Genitourinary:  No dysuria, urinary retention or frequency Musculoskeletal:  No neck pain, back pain Integumentary: No rash, pruritus, skin lesions Neurological: as above Psychiatric: No depression, insomnia, anxiety Endocrine: No palpitations, fatigue, diaphoresis, mood swings, change in appetite, change in weight, increased thirst Hematologic/Lymphatic:  No purpura, petechiae. Allergic/Immunologic: no itchy/runny eyes, nasal congestion, recent allergic reactions, rashes  PHYSICAL EXAM: Vitals:   02/26/18 1547  BP: (!) 146/82  Pulse: 71  SpO2: 98%   General: No acute distress.  Patient appears well-groomed.   Head:  Normocephalic/atraumatic Eyes:  Fundi examined but not visualized Neck: supple, no paraspinal tenderness, full range of motion Heart:  Regular rate and rhythm Lungs:  Clear to auscultation bilaterally Back: No paraspinal tenderness Neurological Exam:alert and oriented to person, place, and time. Attention span and concentration intact, recent and remote memory intact, fund of knowledge intact.  Speech fluent and not dysarthric, language intact.  Left  exophthalmos, left eye hypertropic and abducted on primary gaze, reduced adduction of left eye on extraocular muscle testing, decreased vision in left eye.  Otherwise, CN II-XII intact. Bulk and tone normal, muscle strength 5/5 throughout.  Sensation to light touch  intact.  Deep tendon reflexes 2+ throughout.  Finger to nose testing intact.  Antalgic gait of right lower extremity (wearing knee brace).  Romberg negative.   IMPRESSION: 1.  BPPV 2.  Abnormal MRI of brain, suspicious for MS.  No clinical symptoms of  MS. Questionable how much is related to cerebrovascular disease.   Vertigo felt to be BPPV.    PLAN: 1.  Send to vestibular rehabilitation  2.  Follow up afterwards.  19 minutes spent face to face with patient, over 50% spent discussing management.  Metta Clines, DO  CC:  [name], MD

## 2018-02-27 ENCOUNTER — Ambulatory Visit: Payer: Medicare Other | Admitting: Neurology

## 2018-03-03 ENCOUNTER — Other Ambulatory Visit: Payer: Medicare Other | Admitting: *Deleted

## 2018-03-03 DIAGNOSIS — Z79899 Other long term (current) drug therapy: Secondary | ICD-10-CM

## 2018-03-03 LAB — BASIC METABOLIC PANEL
BUN/Creatinine Ratio: 16 (ref 12–28)
BUN: 18 mg/dL (ref 8–27)
CALCIUM: 9.7 mg/dL (ref 8.7–10.3)
CHLORIDE: 105 mmol/L (ref 96–106)
CO2: 21 mmol/L (ref 20–29)
Creatinine, Ser: 1.16 mg/dL — ABNORMAL HIGH (ref 0.57–1.00)
GFR calc Af Amer: 57 mL/min/{1.73_m2} — ABNORMAL LOW (ref >59)
GFR calc non Af Amer: 49 mL/min/{1.73_m2} — ABNORMAL LOW (ref >59)
GLUCOSE: 82 mg/dL (ref 65–99)
POTASSIUM: 4.2 mmol/L (ref 3.5–5.2)
SODIUM: 140 mmol/L (ref 134–144)

## 2018-03-07 ENCOUNTER — Ambulatory Visit: Payer: Medicare Other

## 2018-03-07 ENCOUNTER — Ambulatory Visit
Admission: RE | Admit: 2018-03-07 | Discharge: 2018-03-07 | Disposition: A | Discharge status: Home / Self Care | Payer: Medicare Other | Source: Ambulatory Visit | Attending: Internal Medicine | Admitting: Internal Medicine

## 2018-03-07 DIAGNOSIS — R928 Other abnormal and inconclusive findings on diagnostic imaging of breast: Secondary | ICD-10-CM

## 2018-03-12 ENCOUNTER — Encounter (HOSPITAL_COMMUNITY): Payer: Self-pay

## 2018-03-12 ENCOUNTER — Ambulatory Visit (HOSPITAL_COMMUNITY): Admission: RE | Admit: 2018-03-12 | Payer: Medicare Other | Source: Ambulatory Visit

## 2018-03-12 ENCOUNTER — Ambulatory Visit (HOSPITAL_COMMUNITY)
Admission: RE | Admit: 2018-03-12 | Discharge: 2018-03-12 | Disposition: A | Discharge status: Home / Self Care | Payer: Medicare Other | Source: Ambulatory Visit | Attending: Physician Assistant | Admitting: Physician Assistant

## 2018-03-12 DIAGNOSIS — I700 Atherosclerosis of aorta: Secondary | ICD-10-CM | POA: Diagnosis not present

## 2018-03-12 DIAGNOSIS — R079 Chest pain, unspecified: Secondary | ICD-10-CM | POA: Insufficient documentation

## 2018-03-12 MED ORDER — IOPAMIDOL (ISOVUE-370) INJECTION 76%
100.0000 mL | Freq: Once | INTRAVENOUS | Status: AC | PRN
  Administered 2018-03-12: 80 mL via INTRAVENOUS

## 2018-03-12 MED ORDER — METOPROLOL TARTRATE 5 MG/5ML IV SOLN
INTRAVENOUS | Status: AC
  Administered 2018-03-12: 5 mg via INTRAVENOUS
  Filled 2018-03-12: qty 15

## 2018-03-12 MED ORDER — METOPROLOL TARTRATE 5 MG/5ML IV SOLN
5.0000 mg | INTRAVENOUS | Status: DC | PRN
  Administered 2018-03-12 (×3): 5 mg via INTRAVENOUS
  Filled 2018-03-12: qty 5

## 2018-03-12 MED ORDER — NITROGLYCERIN 0.4 MG SL SUBL
0.8000 mg | SUBLINGUAL_TABLET | Freq: Once | SUBLINGUAL | Status: AC
  Administered 2018-03-12: 0.8 mg via SUBLINGUAL
  Filled 2018-03-12: qty 25

## 2018-03-12 MED ORDER — IOPAMIDOL (ISOVUE-370) INJECTION 76%
INTRAVENOUS | Status: AC
  Filled 2018-03-12: qty 200

## 2018-03-12 MED ORDER — NITROGLYCERIN 0.4 MG SL SUBL
SUBLINGUAL_TABLET | SUBLINGUAL | Status: AC
  Administered 2018-03-12: 0.8 mg via SUBLINGUAL
  Filled 2018-03-12: qty 2

## 2018-03-12 NOTE — Progress Notes (Signed)
Patient tolerated CT without incident. Given patient a soda and peanut butter crackers. Ambulated steady with single pole cane to exit.

## 2018-03-20 NOTE — Progress Notes (Signed)
[name] [place], [place] [address] Phone: [phone] Subjective:   Fontaine No, am serving as a scribe for Dr. Hulan Saas.    CC: Knee pain  PRF:[id number]  Brooke [name] is a 67 y.o. female coming in with complaint of knee pain. She had an injection last visit which did not provide any relief. Patient states that she falls a lot at home. Patient is having pain from right hip to right knee. Has trouble sleeping. Patient's last visit was the viscosupplementation.  Since then unfortunately continues to have pain.  Following multiple times.  Has failed all other conservative therapy.     Past Medical History:  Diagnosis Date  . ALOPECIA 12/30/2007  . ANEMIA-NOS 02/09/2008  . COLD SORE 10/01/2007  . COLONIC POLYPS, HX OF 02/26/2007  . Degenerative arthritis of left knee 05/18/2014  . DIABETES MELLITUS, TYPE II 11/29/2008  . ECZEMA, ATOPIC DERMATITIS 09/19/2006  . FATIGUE 09/05/2009  . GERD 02/09/2008  . GOITER NOS 09/19/2006  . GOUT 08/09/2008  . HYPERLIPIDEMIA 10/01/2007  . HYPERTENSION 02/26/2007  . INSOMNIA NOS 09/19/2006  . OTITIS EXTERNA, RIGHT 02/28/2009  . Pain in joint, lower leg 10/01/2007  . PVC's (premature ventricular contractions)   . SINUSITIS- ACUTE-NOS 05/20/2009  . SKIN LESION 08/09/2008  . Syncope 11/06/2013  . Unspecified asthma(493.90) 09/19/2006  . Urinary frequency 11/29/2008  . Ventricular tachycardia, polymorphic ([place]) 01/04/2014   Past Surgical History:  Procedure Laterality Date  . ABDOMINAL HYSTERECTOMY  10/22/1999   BSO  . CHOLECYSTECTOMY  3.1.2004  . L RAS re-stented -  03/08/2005  . L RAS tx'd w/angioplasty  11/21/2003  . left cataract surgury    . LEFT HEART CATHETERIZATION WITH CORONARY ANGIOGRAM N/A 01/07/2014   Procedure: LEFT HEART CATHETERIZATION WITH CORONARY ANGIOGRAM;  Surgeon: Peter M Martinique, MD;  Location: [facility] CATH LAB;  Service: Cardiovascular;  Laterality: N/A;  . radioactive iodine ablate  3/03   thyroid 10/07/2001  . [place] PROCEDURE  6/98 neg  . Split sleep study neg for sleep apnea  11/15/2003   Social History   Socioeconomic History  . Marital status: Married    Spouse name: Not on file  . Number of children: 2  . Years of education: Not on file  . Highest education level: Not on file  Occupational History  . Occupation: CNA    Employer: [employer]  . Financial resource strain: Not on file  . Food insecurity:    Worry: Not on file    Inability: Not on file  . Transportation needs:    Medical: Not on file    Non-medical: Not on file  Tobacco Use  . Smoking status: Never Smoker  . Smokeless tobacco: Never Used  Substance and Sexual Activity  . Alcohol use: No  . Drug use: No  . Sexual activity: Not on file  Lifestyle  . Physical activity:    Days per week: Not on file    Minutes per session: Not on file  . Stress: Not on file  Relationships  . Social connections:    Talks on phone: Not on file    Gets together: Not on file    Attends religious service: Not on file    Active member of club or organization: Not on file    Attends meetings of clubs or organizations: Not on file    Relationship status: Not on file  Other Topics Concern  . Not on  file  Social History Narrative  . Not on file   Allergies  Allergen Reactions  . Ace Inhibitors Other (See Comments)    Reaction:  Unknown   . Codeine Nausea And Vomiting and Rash  . Hydrocodone Nausea And Vomiting  . Tramadol Nausea And Vomiting   Family History  Problem Relation Age of Onset  . Diabetes Mother   . Heart disease Father     Current Outpatient Medications (Endocrine & Metabolic):  .  metFORMIN (GLUCOPHAGE-XR) 500 MG 24 hr tablet, TAKE ONE TABLET BY MOUTH ONCE DAILY WITH  BREAKFAST  Current Outpatient Medications (Cardiovascular):  .  amLODipine (NORVASC) 10 MG tablet, Take 1 tablet (10 mg total) by mouth daily. .  hydrochlorothiazide (HYDRODIURIL) 25 MG tablet, Take 1  tablet (25 mg total) by mouth daily. .  metoprolol succinate (TOPROL-XL) 100 MG 24 hr tablet, Take 1 tablet by mouth in the a.m and 1 tablet by mouth in the p.m Take with or immediately following a meal. .  rosuvastatin (CRESTOR) 20 MG tablet, Take 1 tablet (20 mg total) by mouth daily.  Current Outpatient Medications (Respiratory):  .  albuterol (PROVENTIL HFA;VENTOLIN HFA) 108 (90 Base) MCG/ACT inhaler, Inhale 2 puffs into the lungs every 6 (six) hours as needed for wheezing. .  fluticasone (FLOVENT HFA) 110 MCG/ACT inhaler, Inhale 2 puffs into the lungs 2 (two) times daily.  Current Outpatient Medications (Analgesics):  .  allopurinol (ZYLOPRIM) 100 MG tablet, Take 1 tablet (100 mg total) by mouth daily. .  colchicine 0.6 MG tablet, Take 1 tablet (0.6 mg total) by mouth daily. Marland Kitchen  ibuprofen (ADVIL,MOTRIN) 800 MG tablet, TAKE 1 TABLET BY MOUTH 2 TIMES DAILY AS NEEDED AS PAIN   Current Outpatient Medications (Other):  .  citalopram (CELEXA) 10 MG tablet, TAKE 1 TABLET BY MOUTH EVERY DAY .  clonazePAM (KLONOPIN) 0.5 MG tablet, Take 1 tablet (0.5 mg total) by mouth 2 (two) times daily as needed (vertigo). .  gabapentin (NEURONTIN) 100 MG capsule, Take 1 capsule (100 mg total) by mouth at bedtime. Marland Kitchen  KLOR-CON M20 20 MEQ tablet, TAKE 1 TABLET BY MOUTH TWICE A DAY .  meclizine (ANTIVERT) 25 MG tablet, Take 1 tablet (25 mg total) by mouth 3 (three) times daily as needed for dizziness. Marland Kitchen  omeprazole (PRILOSEC) 20 MG capsule, Take 1 capsule (20 mg total) by mouth 2 (two) times daily. Marland Kitchen  zolpidem (AMBIEN) 5 MG tablet, TAKE 1 TABLET BY MOUTH EVERY DAY AT BEDTIME AS NEEDED FOR SLEEP .  AMBULATORY NON FORMULARY MEDICATION, Medication Name: Rolling walker    Past medical history, social, surgical and family history all reviewed in electronic medical record.  No pertanent information unless stated regarding to the chief complaint.   Review of Systems:  No headache, visual changes, nausea, vomiting,  diarrhea, constipation, dizziness, abdominal pain, skin rash, fevers, chills, night sweats, weight loss, swollen lymph nodes,  chest pain, shortness of breath, mood changes.  Positive muscle aches, joint swelling, body aches  Objective  Blood pressure (!) 160/90, pulse 70, height 5\' 3"  (1.6 m), weight 162 lb (73.5 kg), SpO2 98 %.    General: No apparent distress alert and oriented x3 mood and affect normal, dressed appropriately.  HEENT: Pupils cataracts noted Respiratory: Patient's speak in full sentences and does not appear short of breath  Cardiovascular: Trace lower extremity edema, non tender, no erythema  Skin: Warm dry intact with no signs of infection or rash on extremities or on axial skeleton.  Abdomen:  Soft nontender  Neuro: Cranial nerves II through XII are intact, neurovascularly intact in all extremities with 2+ DTRs and 2+ pulses.  Lymph: No lymphadenopathy of posterior or anterior cervical chain or axillae bilaterally.  Gait severely antalgic MSK:  Non tender with full range of motion and good stability and symmetric strength and tone of shoulders, elbows, wrist, hip, and ankles bilaterally.  Knee: Bilateral valgus deformity noted..  Tender to palpation over medial and PF joint line.  ROM full in flexion and extension and lower leg rotation. instability with valgus force.  painful patellar compression. Patellar glide with moderate crepitus. Patellar and quadriceps tendons unremarkable. Hamstring and quadriceps strength is normal.    Impression and Recommendations:     This case required medical decision making of moderate complexity. The above documentation has been reviewed and is accurate and complete Lyndal Pulley, DO       Note: This dictation was prepared with Dragon dictation along with smaller phrase technology. Any transcriptional errors that result from this process are unintentional.

## 2018-03-21 ENCOUNTER — Encounter: Payer: Self-pay | Admitting: Family Medicine

## 2018-03-21 ENCOUNTER — Ambulatory Visit: Payer: Medicare Other | Admitting: Family Medicine

## 2018-03-21 VITALS — BP 160/90 | HR 70 | Ht 63.0 in | Wt 162.0 lb

## 2018-03-21 DIAGNOSIS — M25569 Pain in unspecified knee: Secondary | ICD-10-CM

## 2018-03-21 DIAGNOSIS — M170 Bilateral primary osteoarthritis of knee: Secondary | ICD-10-CM | POA: Diagnosis not present

## 2018-03-21 DIAGNOSIS — G8929 Other chronic pain: Secondary | ICD-10-CM | POA: Diagnosis not present

## 2018-03-21 MED ORDER — AMBULATORY NON FORMULARY MEDICATION: 0 refills | Status: DC

## 2018-03-21 NOTE — Patient Instructions (Addendum)
Good to see you  I am sorry this did not help  I will get you to see Dr. Mayer Camel to discuss replacement.  I am here if you have questions

## 2018-03-21 NOTE — Assessment & Plan Note (Signed)
Severe, not responding to conservative therapy.  Patient has had steroid injections as well as Visco supplementation but now is having pain all the time and is having increasing instability.  Patient will need to be referred to orthopedic surgery for further evaluation and treatment.

## 2018-03-25 ENCOUNTER — Encounter: Payer: Self-pay | Admitting: Physician Assistant

## 2018-03-25 NOTE — Progress Notes (Addendum)
Cardiology Office Note    Date:  03/26/2018  ID:  Brooke [name], DOB [birth year], MRN [id number] PCP:  Biagio Borg, MD  Cardiologist:  Fransico Him, MD   Chief Complaint: f/u chest pain  History of Present Illness:  Brooke [name] is a 67 y.o. female with history of PVCs, polymorphic VT, HTN, dyslipidemia (followed by primary care), DM, syncope, aortic atherosclerosis who presents for f/u testing.  She also has a history of polymorphic VT. Dr. Olin Pia note from 2015 indicates event monitor "demonstrated PVCs often occurring in couplets and 2 episodes of PVC triggered polymorphic ventricular tachycardia 1 occurred at night and was asymptomatic. It [sic] was associated with dizziness." Cardiac MRI was normal 01/2014 with normal EF 73%.Lexiscan myoview 11/2013 showed no ischemia.Cardiac cath 12/2013 showed normal coronaries and normal LVEF. She was started on Toprol. Her polymorphic VT was felt to be triggered by PVC's. At last OV 11/2016 she had elevated blood pressure and was describing some atypical chest pain. Toprol was increased. Nuclear stress test 12/2016 showed small moderate intensity anteroapical defect that was fixed and felt consistent with apical thinning, low risk study, EF 62%. I saw her in follow-up 01/14/18 reporting vague chest discomfort 1x/week lasting 5 minutes without specific pattern. She also had some orthopnea. She did not have any syncope or near-syncope accompanying this reminiscent of prior VT. BP was running higher. She had been under a lot of stress caring for husband. We increased her Toprol to 100mg  QAM/50mg  QPM. This was further increased to 100mg  BID. Her simvastatin was changed to Crestor given concomitant amlodipine use. 2D Echo 01/22/18 showed moderate LVH, EF 60-65%, grade 1 DD. Coronary CT scan showed normal coronaries (did show scattered calcification in aorta). Labs had shown BNP 472 (slightly elevated, but not markedly so), CBC wnl, most recent Cr 1.16, K 4.2.  She  returns for follow-up today to discuss above testing. In general she has been feeling well without any further CP. She does not have any orthopnea, PND, edema. She has had several falls recently related to acute leg weakness ("knee gives out") which she says she's been told is due to a knee problem she might have to have surgery for. She denies any SOB. She is walking with a 4-pronged cane for support.  Past Medical History:  Diagnosis Date  . ALOPECIA 12/30/2007  . ANEMIA-NOS 02/09/2008  . Aortic atherosclerosis ([place])   . COLD SORE 10/01/2007  . COLONIC POLYPS, HX OF 02/26/2007  . Degenerative arthritis of left knee 05/18/2014  . DIABETES MELLITUS, TYPE II 11/29/2008  . ECZEMA, ATOPIC DERMATITIS 09/19/2006  . FATIGUE 09/05/2009  . GERD 02/09/2008  . GOITER NOS 09/19/2006  . GOUT 08/09/2008  . HYPERLIPIDEMIA 10/01/2007  . HYPERTENSION 02/26/2007  . INSOMNIA NOS 09/19/2006  . OTITIS EXTERNA, RIGHT 02/28/2009  . Pain in joint, lower leg 10/01/2007  . PVC's (premature ventricular contractions)   . SINUSITIS- ACUTE-NOS 05/20/2009  . SKIN LESION 08/09/2008  . Syncope 11/06/2013  . Unspecified asthma(493.90) 09/19/2006  . Urinary frequency 11/29/2008  . Ventricular tachycardia, polymorphic ([place]) 01/04/2014    Past Surgical History:  Procedure Laterality Date  . ABDOMINAL HYSTERECTOMY  10/22/1999   BSO  . CHOLECYSTECTOMY  3.1.2004  . L RAS re-stented -  03/08/2005  . L RAS tx'd w/angioplasty  11/21/2003  . left cataract surgury    . LEFT HEART CATHETERIZATION WITH CORONARY ANGIOGRAM N/A 01/07/2014   Procedure: LEFT HEART CATHETERIZATION WITH CORONARY ANGIOGRAM;  Surgeon: Peter M Martinique,  MD;  Location: [facility] CATH LAB;  Service: Cardiovascular;  Laterality: N/A;  . radioactive iodine ablate  3/03   thyroid 10/07/2001  . [place] PROCEDURE  6/98 neg  . Split sleep study neg for sleep apnea  11/15/2003    Current Medications: Current Meds  Medication Sig  . albuterol (PROVENTIL HFA;VENTOLIN HFA) 108 (90 Base)  MCG/ACT inhaler Inhale 2 puffs into the lungs every 6 (six) hours as needed for wheezing.  Marland Kitchen allopurinol (ZYLOPRIM) 100 MG tablet Take 1 tablet (100 mg total) by mouth daily.  . AMBULATORY NON FORMULARY MEDICATION Medication Name: Rolling walker  . amLODipine (NORVASC) 10 MG tablet Take 1 tablet (10 mg total) by mouth daily.  . citalopram (CELEXA) 10 MG tablet TAKE 1 TABLET BY MOUTH EVERY DAY  . clonazePAM (KLONOPIN) 0.5 MG tablet Take 1 tablet (0.5 mg total) by mouth 2 (two) times daily as needed (vertigo).  . colchicine 0.6 MG tablet Take 1 tablet (0.6 mg total) by mouth daily.  . fluticasone (FLOVENT HFA) 110 MCG/ACT inhaler Inhale 2 puffs into the lungs 2 (two) times daily.  Marland Kitchen gabapentin (NEURONTIN) 100 MG capsule Take 1 capsule (100 mg total) by mouth at bedtime.  . hydrochlorothiazide (HYDRODIURIL) 25 MG tablet Take 1 tablet (25 mg total) by mouth daily.  Marland Kitchen ibuprofen (ADVIL,MOTRIN) 800 MG tablet TAKE 1 TABLET BY MOUTH 2 TIMES DAILY AS NEEDED AS PAIN  . KLOR-CON M20 20 MEQ tablet TAKE 1 TABLET BY MOUTH TWICE A DAY  . meclizine (ANTIVERT) 25 MG tablet Take 1 tablet (25 mg total) by mouth 3 (three) times daily as needed for dizziness.  . metFORMIN (GLUCOPHAGE-XR) 500 MG 24 hr tablet TAKE ONE TABLET BY MOUTH ONCE DAILY WITH  BREAKFAST  . metoprolol succinate (TOPROL-XL) 100 MG 24 hr tablet Take 1 tablet by mouth in the a.m and 1 tablet by mouth in the p.m Take with or immediately following a meal.  . omeprazole (PRILOSEC) 20 MG capsule Take 1 capsule (20 mg total) by mouth 2 (two) times daily.  . rosuvastatin (CRESTOR) 20 MG tablet Take 1 tablet (20 mg total) by mouth daily.  Marland Kitchen zolpidem (AMBIEN) 5 MG tablet TAKE 1 TABLET BY MOUTH EVERY DAY AT BEDTIME AS NEEDED FOR SLEEP      Allergies:   Ace inhibitors; Codeine; Hydrocodone; and Tramadol   Social History   Socioeconomic History  . Marital status: Married    Spouse name: Not on file  . Number of children: 2  . Years of education: Not on  file  . Highest education level: Not on file  Occupational History  . Occupation: CNA    Employer: [employer]  . Financial resource strain: Not on file  . Food insecurity:    Worry: Not on file    Inability: Not on file  . Transportation needs:    Medical: Not on file    Non-medical: Not on file  Tobacco Use  . Smoking status: Never Smoker  . Smokeless tobacco: Never Used  Substance and Sexual Activity  . Alcohol use: No  . Drug use: No  . Sexual activity: Not on file  Lifestyle  . Physical activity:    Days per week: Not on file    Minutes per session: Not on file  . Stress: Not on file  Relationships  . Social connections:    Talks on phone: Not on file    Gets together: Not on file    Attends religious service: Not  on file    Active member of club or organization: Not on file    Attends meetings of clubs or organizations: Not on file    Relationship status: Not on file  Other Topics Concern  . Not on file  Social History Narrative  . Not on file     Family History:  The patient's family history includes Diabetes in her mother; Heart disease in her father.  ROS:   Please see the history of present illness.  All other systems are reviewed and otherwise negative.    PHYSICAL EXAM:   VS:  BP 130/68   Pulse 75   Ht 5\' 3"  (1.6 m)   Wt 165 lb 12.8 oz (75.2 kg)   SpO2 98%   BMI 29.37 kg/m   BMI: Body mass index is 29.37 kg/m. GEN: Well nourished, well developed AAF, in no acute distress HEENT: normocephalic, atraumatic Neck: no JVD, carotid bruits, or masses Cardiac: RRR; very soft flow murmur, no rubs or gallops, no edema  Respiratory:  clear to auscultation bilaterally, normal work of breathing GI: soft, nontender, nondistended, + BS MS: no deformity or atrophy Skin: warm and dry, no rash Neuro:  Alert and Oriented x 3, Strength and sensation are intact, follows commands Psych: euthymic mood, full affect  Wt Readings from Last 3  Encounters:  03/26/18 165 lb 12.8 oz (75.2 kg)  03/21/18 162 lb (73.5 kg)  02/26/18 164 lb (74.4 kg)      Studies/Labs Reviewed:   EKG: EKG was not ordered today  Recent Labs: 09/04/2017: ALT 6 01/14/2018: NT-Pro BNP 472 01/19/2018: Hemoglobin 12.2; Platelets 304 03/03/2018: BUN 18; Creatinine, Ser 1.16; Potassium 4.2; Sodium 140   Lipid Panel    Component Value Date/Time   CHOL 155 07/19/2017 1005   TRIG 117.0 07/19/2017 1005   HDL 43.80 07/19/2017 1005   CHOLHDL 4 07/19/2017 1005   VLDL 23.4 07/19/2017 1005   LDLCALC 88 07/19/2017 1005   LDLDIRECT 139.1 09/05/2009 1052    Additional studies/ records that were reviewed today include: Summarized above   ASSESSMENT & PLAN:   1. Chest discomfort - vague, no evidence of coronary disease by CT scan. 2D echo stable, + diastolic dysfunction but no signs of acute CHF on exam. Chest discomfort has resolved with titration of beta blocker, may have been related to elevated blood pressure. Continue surveillance. She might have to have knee surgery coming up. Given her reassuring cardiac workup and resolution of symptoms, she would be cleared to undergo this from our perspective without further CV testing. Given history of arrhythmia it would be essential to continue perioperative beta blocker and perioperative telemetry monitoring. 2. Essential HTN - improved with titration of metoprolol. She does occasionally have BPs in the 277O-242P systolic at home but she reports this may be in setting of increased stress. She is also taking ibuprofen regularly due to knee pain so we discussed that she should consider changing to Tylenol as alternative, also in light of renal function. Given falling issues I'm not sure I would be excessively aggressive at this time. She also has vertigo (without concomitant arrhythmia during 12/2017 ER visit). She denies syncope or pre-syncope, just that her legs suddenly give out. 3. PVCs/VT - quiescent by symptoms.  Continue BB.  4. Aortic atherosclerosis - continue risk factor modification with statin therapy. Her statin was changed in 12/2017. Will get CMET/lipid profile today.  Disposition: F/u with Dr. Radford Pax in 6 months.  Medication Adjustments/Labs and Tests Ordered: Current  medicines are reviewed at length with the patient today.  Concerns regarding medicines are outlined above. Medication changes, Labs and Tests ordered today are summarized above and listed in the Patient Instructions accessible in Encounters.   Signed, Charlie Pitter, PA-C  03/26/2018 1:33 PM    [facility] [address], [place], [place]  [address] Phone: [phone]; Fax: [phone]

## 2018-03-26 ENCOUNTER — Ambulatory Visit: Payer: Medicare Other | Admitting: Physician Assistant

## 2018-03-26 ENCOUNTER — Encounter: Payer: Self-pay | Admitting: Physician Assistant

## 2018-03-26 VITALS — BP 130/68 | HR 75 | Ht 63.0 in | Wt 165.8 lb

## 2018-03-26 DIAGNOSIS — I472 Ventricular tachycardia: Secondary | ICD-10-CM

## 2018-03-26 DIAGNOSIS — I700 Atherosclerosis of aorta: Secondary | ICD-10-CM | POA: Diagnosis not present

## 2018-03-26 DIAGNOSIS — I493 Ventricular premature depolarization: Secondary | ICD-10-CM | POA: Diagnosis not present

## 2018-03-26 DIAGNOSIS — I10 Essential (primary) hypertension: Secondary | ICD-10-CM

## 2018-03-26 DIAGNOSIS — R0789 Other chest pain: Secondary | ICD-10-CM

## 2018-03-26 DIAGNOSIS — I4729 Other ventricular tachycardia: Secondary | ICD-10-CM

## 2018-03-26 NOTE — Patient Instructions (Signed)
Medication Instructions:  Your physician recommends that you continue on your current medications as directed. Please refer to the Current Medication list given to you today.   Labwork: None ordered  Testing/Procedures: None ordered  Follow-Up: Your physician wants you to follow-up in: 6 MONTHS WITH DR. TURNER.  You will receive a reminder letter in the mail two months in advance. If you don't receive a letter, please call our office to schedule the follow-up appointment.    Any Other Special Instructions Will Be Listed Below (If Applicable).    If you need a refill on your cardiac medications before your next appointment, please call your pharmacy.   

## 2018-03-27 LAB — COMPREHENSIVE METABOLIC PANEL
A/G RATIO: 1.5 (ref 1.2–2.2)
ALK PHOS: 85 IU/L (ref 39–117)
ALT: 6 IU/L (ref 0–32)
AST: 10 IU/L (ref 0–40)
Albumin: 4 g/dL (ref 3.6–4.8)
BUN/Creatinine Ratio: 21 (ref 12–28)
BUN: 22 mg/dL (ref 8–27)
Bilirubin Total: <0.2 mg/dL (ref 0.0–1.2)
CHLORIDE: 106 mmol/L (ref 96–106)
CO2: 21 mmol/L (ref 20–29)
Calcium: 9.7 mg/dL (ref 8.7–10.3)
Creatinine, Ser: 1.07 mg/dL — ABNORMAL HIGH (ref 0.57–1.00)
GFR calc Af Amer: 63 mL/min/{1.73_m2} (ref >59)
GFR calc non Af Amer: 54 mL/min/{1.73_m2} — ABNORMAL LOW (ref >59)
GLOBULIN, TOTAL: 2.6 g/dL (ref 1.5–4.5)
Glucose: 75 mg/dL (ref 65–99)
POTASSIUM: 4 mmol/L (ref 3.5–5.2)
SODIUM: 142 mmol/L (ref 134–144)
Total Protein: 6.6 g/dL (ref 6.0–8.5)

## 2018-03-27 LAB — LIPID PANEL
CHOL/HDL RATIO: 2.5 ratio (ref 0.0–4.4)
CHOLESTEROL TOTAL: 128 mg/dL (ref 100–199)
HDL: 52 mg/dL (ref >39)
LDL CALC: 60 mg/dL (ref 0–99)
TRIGLYCERIDES: 82 mg/dL (ref 0–149)
VLDL CHOLESTEROL CAL: 16 mg/dL (ref 5–40)

## 2018-03-31 DIAGNOSIS — E119 Type 2 diabetes mellitus without complications: Secondary | ICD-10-CM | POA: Diagnosis not present

## 2018-04-01 DIAGNOSIS — E119 Type 2 diabetes mellitus without complications: Secondary | ICD-10-CM | POA: Diagnosis not present
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
## 2018-04-08 ENCOUNTER — Telehealth: Payer: Self-pay | Admitting: *Deleted

## 2018-04-08 ENCOUNTER — Ambulatory Visit (INDEPENDENT_AMBULATORY_CARE_PROVIDER_SITE_OTHER): Payer: Medicare Other | Admitting: *Deleted

## 2018-04-08 VITALS — BP 146/60 | HR 84 | Resp 18 | Ht 62.0 in | Wt 164.0 lb

## 2018-04-08 DIAGNOSIS — Z0000 Encounter for general adult medical examination without abnormal findings: Secondary | ICD-10-CM | POA: Diagnosis not present

## 2018-04-08 DIAGNOSIS — Z23 Encounter for immunization: Secondary | ICD-10-CM | POA: Diagnosis not present

## 2018-04-08 MED ORDER — OMEPRAZOLE 20 MG PO CPDR: 20.0000 mg | DELAYED_RELEASE_CAPSULE | Freq: Two times a day (BID) | ORAL | 11 refills | Status: DC

## 2018-04-08 MED ORDER — CITALOPRAM HYDROBROMIDE 20 MG PO TABS: 20.0000 mg | ORAL_TABLET | Freq: Every day | ORAL | 3 refills | Status: DC

## 2018-04-08 NOTE — Telephone Encounter (Signed)
During AWV, patient stated that she is having feelings of depression. 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 She asked if her citalopram dose could be increased. PHQ-9 score is 8. Nurse did give patient community resources for respite care and caregiver support group resources.

## 2018-04-08 NOTE — Telephone Encounter (Signed)
Done erx 

## 2018-04-08 NOTE — Patient Instructions (Signed)
Continue doing brain stimulating activities (puzzles, reading, adult coloring books, staying active) to keep memory sharp.   Continue to eat heart healthy diet (full of fruits, vegetables, whole grains, lean protein, water--limit salt, fat, and sugar intake) and increase physical activity as tolerated.   Ms. Brooke [name] , Thank you for taking time to come for your Medicare Wellness Visit. I appreciate your ongoing commitment to your health goals. Please review the following plan we discussed and let me know if I can assist you in the future.   These are the goals we discussed: Goals    . Patient Stated     Take time for me, go to my room and read bible, meditate, listen to music. Go to church as much as possible.       This is a list of the screening recommended for you and due dates:  Health Maintenance  Topic Date Due  . Urine Protein Check  01/10/2018  . Hemoglobin A1C  01/17/2018  . Flu Shot  02/20/2018  . Eye exam for diabetics  01/15/2019  . Complete foot exam   01/22/2019  . Colon Cancer Screening  10/12/2019  . Mammogram  02/26/2020  . Tetanus Vaccine  01/11/2027  . DEXA scan (bone density measurement)  Completed  .  Hepatitis C: One time screening is recommended by Center for Disease Control  (CDC) for  adults born from [birth year] through 1964.   Completed  . Pneumonia vaccines  Completed   Influenza Virus Vaccine injection What is this medicine? INFLUENZA VIRUS VACCINE (in floo EN zuh VAHY ruhs vak SEEN) helps to reduce the risk of getting influenza also known as the flu. The vaccine only helps protect you against some strains of the flu. This medicine may be used for other purposes; ask your health care provider or pharmacist if you have questions. COMMON BRAND NAME(S): Afluria, Agriflu, Alfuria, FLUAD, Fluarix, Fluarix Quadrivalent, Flublok, Flublok Quadrivalent, FLUCELVAX, Flulaval, Fluvirin, Fluzone, Fluzone High-Dose, Fluzone Intradermal What should I tell my health care  provider before I take this medicine? They need to know if you have any of these conditions: -bleeding disorder like hemophilia -fever or infection -Guillain-Barre syndrome or other neurological problems -immune system problems -infection with the human immunodeficiency virus (HIV) or AIDS -low blood platelet counts -multiple sclerosis -an unusual or allergic reaction to influenza virus vaccine, latex, other medicines, foods, dyes, or preservatives. Different brands of vaccines contain different allergens. Some may contain latex or eggs. Talk to your doctor about your allergies to make sure that you get the right vaccine. -pregnant or trying to get pregnant -breast-feeding How should I use this medicine? This vaccine is for injection into a muscle or under the skin. It is given by a health care professional. A copy of Vaccine Information Statements will be given before each vaccination. Read this sheet carefully each time. The sheet may change frequently. Talk to your healthcare provider to see which vaccines are right for you. Some vaccines should not be used in all age groups. Overdosage: If you think you have taken too much of this medicine contact a poison control center or emergency room at once. NOTE: This medicine is only for you. Do not share this medicine with others. What if I miss a dose? This does not apply. What may interact with this medicine? -chemotherapy or radiation therapy -medicines that lower your immune system like etanercept, anakinra, infliximab, and adalimumab -medicines that treat or prevent blood clots like warfarin -phenytoin -steroid medicines like prednisone  or cortisone -theophylline -vaccines This list may not describe all possible interactions. Give your health care provider a list of all the medicines, herbs, non-prescription drugs, or dietary supplements you use. Also tell them if you smoke, drink alcohol, or use illegal drugs. Some items may interact  with your medicine. What should I watch for while using this medicine? Report any side effects that do not go away within 3 days to your doctor or health care professional. Call your health care provider if any unusual symptoms occur within 6 weeks of receiving this vaccine. You may still catch the flu, but the illness is not usually as bad. You cannot get the flu from the vaccine. The vaccine will not protect against colds or other illnesses that may cause fever. The vaccine is needed every year. What side effects may I notice from receiving this medicine? Side effects that you should report to your doctor or health care professional as soon as possible: -allergic reactions like skin rash, itching or hives, swelling of the face, lips, or tongue Side effects that usually do not require medical attention (report to your doctor or health care professional if they continue or are bothersome): -fever -headache -muscle aches and pains -pain, tenderness, redness, or swelling at the injection site -tiredness This list may not describe all possible side effects. Call your doctor for medical advice about side effects. You may report side effects to FDA at 1-800-FDA-1088. Where should I keep my medicine? The vaccine will be given by a health care professional in a clinic, pharmacy, doctor's office, or other health care setting. You will not be given vaccine doses to store at home. NOTE: This sheet is a summary. It may not cover all possible information. If you have questions about this medicine, talk to your doctor, pharmacist, or health care provider.  2018 Elsevier/Gold Standard (2015-01-28 10:07:28)

## 2018-04-09 ENCOUNTER — Telehealth: Payer: Self-pay | Admitting: Internal Medicine

## 2018-04-09 ENCOUNTER — Other Ambulatory Visit: Payer: Self-pay | Admitting: *Deleted

## 2018-04-09 MED ORDER — OMEPRAZOLE 20 MG PO CPDR: 20.0000 mg | DELAYED_RELEASE_CAPSULE | Freq: Two times a day (BID) | ORAL | 11 refills | Status: DC

## 2018-04-09 NOTE — Telephone Encounter (Signed)
Rx reordered for patient- original was printed.

## 2018-04-09 NOTE — Telephone Encounter (Signed)
Call to inform patient that PCP sent in prescription for higher dose celexa.

## 2018-04-09 NOTE — Telephone Encounter (Signed)
Copied from [place] [id number]. Topic: Quick Communication - Rx Refill/Question >> Apr 09, 2018  4:19 PM Reyne Dumas L wrote: Medication: omeprazole (PRILOSEC) 20 MG capsule  Has the patient contacted their pharmacy? Yes - states they don't have this on file for her (Agent: If no, request that the patient contact the pharmacy for the refill.) (Agent: If yes, when and what did the pharmacy advise?)  Preferred Pharmacy (with phone number or street name): [id number] - [place], [place] [phone] (Phone) [phone] (Fax)  Agent: Please be advised that RX refills may take up to 3 business days. We ask that you follow-up with your pharmacy.

## 2018-04-15 ENCOUNTER — Other Ambulatory Visit: Payer: Self-pay | Admitting: Orthopedic Surgery

## 2018-04-15 DIAGNOSIS — M1712 Unilateral primary osteoarthritis, left knee: Secondary | ICD-10-CM | POA: Diagnosis not present

## 2018-04-15 DIAGNOSIS — M1711 Unilateral primary osteoarthritis, right knee: Secondary | ICD-10-CM | POA: Diagnosis not present

## 2018-04-22 ENCOUNTER — Ambulatory Visit (INDEPENDENT_AMBULATORY_CARE_PROVIDER_SITE_OTHER): Payer: Medicare Other | Admitting: Internal Medicine

## 2018-04-22 ENCOUNTER — Encounter: Payer: Self-pay | Admitting: Internal Medicine

## 2018-04-22 DIAGNOSIS — I10 Essential (primary) hypertension: Secondary | ICD-10-CM | POA: Diagnosis not present

## 2018-04-22 DIAGNOSIS — E785 Hyperlipidemia, unspecified: Secondary | ICD-10-CM

## 2018-04-22 DIAGNOSIS — E119 Type 2 diabetes mellitus without complications: Secondary | ICD-10-CM

## 2018-04-22 MED ORDER — ZOLPIDEM TARTRATE 10 MG PO TABS: 10.0000 mg | ORAL_TABLET | Freq: Every evening | ORAL | 1 refills | Status: DC | PRN

## 2018-04-22 MED ORDER — GABAPENTIN 100 MG PO CAPS: 100.0000 mg | ORAL_CAPSULE | Freq: Every day | ORAL | 1 refills | Status: DC

## 2018-04-22 MED ORDER — CLONAZEPAM 0.5 MG PO TABS: 0.5000 mg | ORAL_TABLET | Freq: Two times a day (BID) | ORAL | 2 refills | Status: DC | PRN

## 2018-04-22 MED ORDER — METOPROLOL SUCCINATE ER 100 MG PO TB24: 100.0000 mg | ORAL_TABLET | Freq: Two times a day (BID) | ORAL | 3 refills | Status: DC

## 2018-04-22 NOTE — Assessment & Plan Note (Signed)
stable overall by history and exam, recent data reviewed with pt, and pt to continue medical treatment as before,  to f/u any worsening symptoms or concerns Lab Results  Component Value Date   HGBA1C 5.8 07/19/2017

## 2018-04-22 NOTE — Assessment & Plan Note (Signed)
Mild persistent elevated now worse with out of BB, to restart all meds and continue to monitor at home, with goal at least < 160's sbp to qualify for surgury upcoming

## 2018-04-22 NOTE — Patient Instructions (Signed)
Please continue all other medications as before, and refills have been done if requested - the gabapentin, metoprolol, zolpidem, and clonazepam  Please have the pharmacy call with any other refills you may need.  Please continue your efforts at being more active, low cholesterol diet, and weight control.  You are otherwise up to date with prevention measures today.  Please keep your appointments with your specialists as you may have planned  Good Luck with your Surgury!

## 2018-04-22 NOTE — Progress Notes (Signed)
Subjective:    Patient ID: Brooke [name], female    DOB: 08-02-1950, 67 y.o.   MRN: [id number]  HPI    Here to f/u; overall doing ok,  Pt denies chest pain, increasing sob or doe, wheezing, orthopnea, PND, increased LE swelling, palpitations, dizziness or syncope.  Pt denies new neurological symptoms such as new headache, or facial or extremity weakness or numbness.  Pt denies polydipsia, polyuria, or low sugar episode.  Pt states overall good compliance with meds, mostly trying to follow appropriate diet, with wt overall stable,  but little exercise however  BP at home about 150's per pt, adamantly declines med change and wants to follow at home.  Denies worsening depressive symptoms, suicidal ideation, or panic; has ongoing anxiety and occasional vertigo, asks for klonopin refill.  Also out of metoprolol recently and feels BP up due to this.  Needs Ambien refill, as well as gabapentin.  Due for right knee surgury in about 2 wks BP Readings from Last 3 Encounters:  04/22/18 (!) 174/94  04/08/18 (!) 146/60  03/26/18 130/68   Past Medical History:  Diagnosis Date  . ALOPECIA 12/30/2007  . ANEMIA-NOS 02/09/2008  . Aortic atherosclerosis ([place])   . COLD SORE 10/01/2007  . COLONIC POLYPS, HX OF 02/26/2007  . Degenerative arthritis of left knee 05/18/2014  . DIABETES MELLITUS, TYPE II 11/29/2008  . ECZEMA, ATOPIC DERMATITIS 09/19/2006  . FATIGUE 09/05/2009  . GERD 02/09/2008  . GOITER NOS 09/19/2006  . GOUT 08/09/2008  . HYPERLIPIDEMIA 10/01/2007  . HYPERTENSION 02/26/2007  . INSOMNIA NOS 09/19/2006  . OTITIS EXTERNA, RIGHT 02/28/2009  . Pain in joint, lower leg 10/01/2007  . PVC's (premature ventricular contractions)   . SINUSITIS- ACUTE-NOS 05/20/2009  . SKIN LESION 08/09/2008  . Syncope 11/06/2013  . Unspecified asthma(493.90) 09/19/2006  . Urinary frequency 11/29/2008  . Ventricular tachycardia, polymorphic ([place]) 01/04/2014   Past Surgical History:  Procedure Laterality Date  . ABDOMINAL HYSTERECTOMY   10/22/1999   BSO  . CHOLECYSTECTOMY  3.1.2004  . L RAS re-stented -  03/08/2005  . L RAS tx'd w/angioplasty  11/21/2003  . left cataract surgury    . LEFT HEART CATHETERIZATION WITH CORONARY ANGIOGRAM N/A 01/07/2014   Procedure: LEFT HEART CATHETERIZATION WITH CORONARY ANGIOGRAM;  Surgeon: Peter M Martinique, MD;  Location: [facility] CATH LAB;  Service: Cardiovascular;  Laterality: N/A;  . radioactive iodine ablate  3/03   thyroid 10/07/2001  . [place] PROCEDURE  6/98 neg  . Split sleep study neg for sleep apnea  11/15/2003    reports that she has never smoked. She has never used smokeless tobacco. She reports that she does not drink alcohol or use drugs. family history includes Diabetes in her mother; Heart disease in her father. Allergies  Allergen Reactions  . Ace Inhibitors Swelling       . Codeine Nausea And Vomiting and Rash  . Hydrocodone Nausea And Vomiting  . Tramadol Nausea And Vomiting   Current Outpatient Medications on File Prior to Visit  Medication Sig Dispense Refill  . albuterol (PROVENTIL HFA;VENTOLIN HFA) 108 (90 Base) MCG/ACT inhaler Inhale 2 puffs into the lungs every 6 (six) hours as needed for wheezing. 1 Inhaler 11  . allopurinol (ZYLOPRIM) 100 MG tablet Take 1 tablet (100 mg total) by mouth daily. 90 tablet 3  . AMBULATORY NON FORMULARY MEDICATION Medication Name: Rolling walker 1 Device 0  . amLODipine (NORVASC) 10 MG tablet Take 1 tablet (10 mg total) by mouth daily. 90 tablet  3  . citalopram (CELEXA) 20 MG tablet Take 1 tablet (20 mg total) by mouth daily. 90 tablet 3  . fluticasone (FLOVENT HFA) 110 MCG/ACT inhaler Inhale 2 puffs into the lungs 2 (two) times daily. 12 g 12  . hydrochlorothiazide (HYDRODIURIL) 25 MG tablet Take 1 tablet (25 mg total) by mouth daily. 90 tablet 3  . ibuprofen (ADVIL,MOTRIN) 800 MG tablet TAKE 1 TABLET BY MOUTH 2 TIMES DAILY AS NEEDED AS PAIN (Patient taking differently: Take 800 mg by mouth 2 (two) times daily. TAKE 1 TABLET BY MOUTH 2 TIMES  DAILY AS NEEDED AS PAIN) 60 tablet 2  . KLOR-CON M20 20 MEQ tablet TAKE 1 TABLET BY MOUTH TWICE A DAY (Patient taking differently: Take 20 mEq by mouth 2 (two) times daily. ) 60 tablet 11  . meclizine (ANTIVERT) 25 MG tablet Take 1 tablet (25 mg total) by mouth 3 (three) times daily as needed for dizziness. 30 tablet 5  . metFORMIN (GLUCOPHAGE-XR) 500 MG 24 hr tablet TAKE ONE TABLET BY MOUTH ONCE DAILY WITH  BREAKFAST (Patient taking differently: Take 500 mg by mouth daily with breakfast. TAKE ONE TABLET BY MOUTH ONCE DAILY WITH  BREAKFAST) 90 tablet 3  . omeprazole (PRILOSEC) 20 MG capsule Take 1 capsule (20 mg total) by mouth 2 (two) times daily. 60 capsule 11  . rosuvastatin (CRESTOR) 20 MG tablet Take 1 tablet (20 mg total) by mouth daily. 90 tablet 3   No current facility-administered medications on file prior to visit.    Review of Systems  Constitutional: Negative for other unusual diaphoresis or sweats HENT: Negative for ear discharge or swelling Eyes: Negative for other worsening visual disturbances Respiratory: Negative for stridor or other swelling  Gastrointestinal: Negative for worsening distension or other blood Genitourinary: Negative for retention or other urinary change Musculoskeletal: Negative for other MSK pain or swelling Skin: Negative for color change or other new lesions Neurological: Negative for worsening tremors and other numbness  Psychiatric/Behavioral: Negative for worsening agitation or other fatigue All other system neg per pt    Objective:   Physical Exam BP (!) 174/94   Pulse 74   Temp 98.2 F (36.8 C) (Oral)   Ht 5\' 2"  (1.575 m)   Wt 166 lb (75.3 kg)   SpO2 95%   BMI 30.36 kg/m  VS noted,  Constitutional: Pt appears in NAD HENT: Head: NCAT.  Right Ear: External ear normal.  Left Ear: External ear normal.  Eyes: . Pupils are equal, round, and reactive to light. Conjunctivae and EOM are normal Nose: without d/c or deformity Neck: Neck supple.  Gross normal ROM Cardiovascular: Normal rate and regular rhythm.   Pulmonary/Chest: Effort normal and breath sounds without rales or wheezing.  Abd:  Soft, NT, ND, + BS, no organomegaly Neurological: Pt is alert. At baseline orientation, motor grossly intact Skin: Skin is warm. No rashes, other new lesions, no LE edema Psychiatric: Pt behavior is normal without agitation , 1+ nervous No other exam findings  Lab Results  Component Value Date   WBC 6.9 01/19/2018   HGB 12.2 01/19/2018   HCT 37.7 01/19/2018   PLT 304 01/19/2018   GLUCOSE 75 03/26/2018   CHOL 128 03/26/2018   TRIG 82 03/26/2018   HDL 52 03/26/2018   LDLDIRECT 139.1 09/05/2009   LDLCALC 60 03/26/2018   ALT 6 03/26/2018   AST 10 03/26/2018   NA 142 03/26/2018   K 4.0 03/26/2018   CL 106 03/26/2018   CREATININE 1.07 (  H) 03/26/2018   BUN 22 03/26/2018   CO2 21 03/26/2018   TSH 1.85 01/10/2017   INR 0.95 01/07/2014   HGBA1C 5.8 07/19/2017   MICROALBUR 1.0 01/10/2017        Assessment & Plan:

## 2018-04-22 NOTE — Assessment & Plan Note (Signed)
stable overall by history and exam, recent data reviewed with pt, and pt to continue medical treatment as before,  to f/u any worsening symptoms or concerns  

## 2018-04-25 NOTE — Patient Instructions (Addendum)
Brooke [name]  01/12/1951    Your procedure is scheduled on:  05-05-2018   Report to [facility] Main  Entrance,  Report to admitting at 7:15 AM    Call this number if you have problems the morning of surgery [phone]     Remember: Do not eat food or drink liquids :After Midnight.                     BRUSH YOUR TEETH MORNING OF SURGERY AND RINSE YOUR MOUTH OUT, NO CHEWING GUM CANDY OR MINTS.     Take these medicines the morning of surgery with A SIP OF WATER:  Allopurinol, Citalopram(celexa), Rosuvastatin(crestor), Metoprolol, Amlodipine, Omeprazole,                                                               Flovent inhaler, albuterol inhaler if needed  (bring both inhaler with you day of surgery)                    DO NOT TAKE ANY DIABETIC MEDICATIONS DAY OF YOUR SURGERY                                            You may not have any metal on your body including hair pins and              piercings                Do not wear jewelry, make-up, lotions, powders or perfumes, deodorant                          Do not wear nail polish.  Do not shave  48 hours prior to surgery.               Do not bring valuables to the hospital. [place].  Contacts, dentures or bridgework may not be worn into surgery.  Leave suitcase in the car. After surgery it may be brought to your room.    Special Instructions: N/A              Please read over the following fact sheets you were given: _____________________________________________________________________             [facility] - Preparing for Surgery Before surgery, you can play an important role.  Because skin is not sterile, your skin needs to be as free of germs as possible.  You can reduce the number of germs on your skin by washing with CHG (chlorahexidine gluconate) soap before surgery.  CHG is an antiseptic cleaner which kills germs and bonds with  the skin to continue killing germs even after washing. Please DO NOT use if you have an allergy to CHG or antibacterial soaps.  If your skin becomes reddened/irritated stop using the CHG and inform your nurse when you arrive at Short Stay. Do not shave (including legs and underarms)  for at least 48 hours prior to the first CHG shower.  You may shave your face/neck. Please follow these instructions carefully:  1.  Shower with CHG Soap the night before surgery and the  morning of Surgery.  2.  If you choose to wash your hair, wash your hair first as usual with your  normal  shampoo.  3.  After you shampoo, rinse your hair and body thoroughly to remove the  shampoo.                            4.  Use CHG as you would any other liquid soap.  You can apply chg directly  to the skin and wash                       Gently with a scrungie or clean washcloth.  5.  Apply the CHG Soap to your body ONLY FROM THE NECK DOWN.   Do not use on face/ open                           Wound or open sores. Avoid contact with eyes, ears mouth and genitals (private parts).                       Wash face,  Genitals (private parts) with your normal soap.             6.  Wash thoroughly, paying special attention to the area where your surgery  will be performed.  7.  Thoroughly rinse your body with warm water from the neck down.  8.  DO NOT shower/wash with your normal soap after using and rinsing off  the CHG Soap.             9.  Pat yourself dry with a clean towel.            10.  Wear clean pajamas.            11.  Place clean sheets on your bed the night of your first shower and do not  sleep with pets. Day of Surgery : Do not apply any lotions/deodorants the morning of surgery.  Please wear clean clothes to the hospital/surgery center.  FAILURE TO FOLLOW THESE INSTRUCTIONS MAY RESULT IN THE CANCELLATION OF YOUR SURGERY PATIENT SIGNATURE_________________________________  NURSE  SIGNATURE__________________________________  ________________________________________________________________________   Adam Phenix  An incentive spirometer is a tool that can help keep your lungs clear and active. This tool measures how well you are filling your lungs with each breath. Taking long deep breaths may help reverse or decrease the chance of developing breathing (pulmonary) problems (especially infection) following:  A long period of time when you are unable to move or be active. BEFORE THE PROCEDURE   If the spirometer includes an indicator to show your best effort, your nurse or respiratory therapist will set it to a desired goal.  If possible, sit up straight or lean slightly forward. Try not to slouch.  Hold the incentive spirometer in an upright position. INSTRUCTIONS FOR USE  1. Sit on the edge of your bed if possible, or sit up as far as you can in bed or on a chair. 2. Hold the incentive spirometer in an upright position. 3. Breathe out normally. 4. Place the mouthpiece in your mouth and seal your lips tightly around it.  5. Breathe in slowly and as deeply as possible, raising the piston or the ball toward the top of the column. 6. Hold your breath for 3-5 seconds or for as long as possible. Allow the piston or ball to fall to the bottom of the column. 7. Remove the mouthpiece from your mouth and breathe out normally. 8. Rest for a few seconds and repeat Steps 1 through 7 at least 10 times every 1-2 hours when you are awake. Take your time and take a few normal breaths between deep breaths. 9. The spirometer may include an indicator to show your best effort. Use the indicator as a goal to work toward during each repetition. 10. After each set of 10 deep breaths, practice coughing to be sure your lungs are clear. If you have an incision (the cut made at the time of surgery), support your incision when coughing by placing a pillow or rolled up towels firmly  against it. Once you are able to get out of bed, walk around indoors and cough well. You may stop using the incentive spirometer when instructed by your caregiver.  RISKS AND COMPLICATIONS  Take your time so you do not get dizzy or light-headed.  If you are in pain, you may need to take or ask for pain medication before doing incentive spirometry. It is harder to take a deep breath if you are having pain. AFTER USE  Rest and breathe slowly and easily.  It can be helpful to keep track of a log of your progress. Your caregiver can provide you with a simple table to help with this. If you are using the spirometer at home, follow these instructions: [place] IF:   You are having difficultly using the spirometer.  You have trouble using the spirometer as often as instructed.  Your pain medication is not giving enough relief while using the spirometer.  You develop fever of 100.5 F (38.1 C) or higher. SEEK IMMEDIATE MEDICAL CARE IF:   You cough up bloody sputum that had not been present before.  You develop fever of 102 F (38.9 C) or greater.  You develop worsening pain at or near the incision site. MAKE SURE YOU:   Understand these instructions.  Will watch your condition.  Will get help right away if you are not doing well or get worse. Document Released: 11/19/2006 Document Revised: 10/01/2011 Document Reviewed: 01/20/2007 ExitCare Patient Information 2014 ExitCare, [place].   ________________________________________________________________________  WHAT IS A BLOOD TRANSFUSION? Blood Transfusion Information  A transfusion is the replacement of blood or some of its parts. Blood is made up of multiple cells which provide different functions.  Red blood cells carry oxygen and are used for blood loss replacement.  White blood cells fight against infection.  Platelets control bleeding.  Plasma helps clot blood.  Other blood products are available for  specialized needs, such as hemophilia or other clotting disorders. BEFORE THE TRANSFUSION  Who gives blood for transfusions?   Healthy volunteers who are fully evaluated to make sure their blood is safe. This is blood bank blood. Transfusion therapy is the safest it has ever been in the practice of medicine. Before blood is taken from a donor, a complete history is taken to make sure that person has no history of diseases nor engages in risky social behavior (examples are intravenous drug use or sexual activity with multiple partners). The donor's travel history is screened to minimize risk of transmitting infections, such as malaria. The donated blood  is tested for signs of infectious diseases, such as HIV and hepatitis. The blood is then tested to be sure it is compatible with you in order to minimize the chance of a transfusion reaction. If you or a relative donates blood, this is often done in anticipation of surgery and is not appropriate for emergency situations. It takes many days to process the donated blood. RISKS AND COMPLICATIONS Although transfusion therapy is very safe and saves many lives, the main dangers of transfusion include:   Getting an infectious disease.  Developing a transfusion reaction. This is an allergic reaction to something in the blood you were given. Every precaution is taken to prevent this. The decision to have a blood transfusion has been considered carefully by your caregiver before blood is given. Blood is not given unless the benefits outweigh the risks. AFTER THE TRANSFUSION  Right after receiving a blood transfusion, you will usually feel much better and more energetic. This is especially true if your red blood cells have gotten low (anemic). The transfusion raises the level of the red blood cells which carry oxygen, and this usually causes an energy increase.  The nurse administering the transfusion will monitor you carefully for complications. HOME CARE  INSTRUCTIONS  No special instructions are needed after a transfusion. You may find your energy is better. Speak with your caregiver about any limitations on activity for underlying diseases you may have. SEEK MEDICAL CARE IF:   Your condition is not improving after your transfusion.  You develop redness or irritation at the intravenous (IV) site. SEEK IMMEDIATE MEDICAL CARE IF:  Any of the following symptoms occur over the next 12 hours:  Shaking chills.  You have a temperature by mouth above 102 F (38.9 C), not controlled by medicine.  Chest, back, or muscle pain.  People around you feel you are not acting correctly or are confused.  Shortness of breath or difficulty breathing.  Dizziness and fainting.  You get a rash or develop hives.  You have a decrease in urine output.  Your urine turns a dark color or changes to pink, red, or brown. Any of the following symptoms occur over the next 10 days:  You have a temperature by mouth above 102 F (38.9 C), not controlled by medicine.  Shortness of breath.  Weakness after normal activity.  The white part of the eye turns yellow (jaundice).  You have a decrease in the amount of urine or are urinating less often.  Your urine turns a dark color or changes to pink, red, or brown. Document Released: 07/06/2000 Document Revised: 10/01/2011 Document Reviewed: 02/23/2008 [facility] Patient Information 2014 [place], [place].  _______________________________________________________________________

## 2018-04-28 ENCOUNTER — Ambulatory Visit (HOSPITAL_COMMUNITY)
Admission: RE | Admit: 2018-04-28 | Discharge: 2018-04-28 | Disposition: A | Discharge status: Home / Self Care | Payer: Medicare Other | Source: Ambulatory Visit | Attending: Orthopedic Surgery | Admitting: Orthopedic Surgery

## 2018-04-28 ENCOUNTER — Encounter (HOSPITAL_COMMUNITY): Payer: Self-pay

## 2018-04-28 ENCOUNTER — Other Ambulatory Visit: Payer: Self-pay

## 2018-04-28 ENCOUNTER — Encounter (HOSPITAL_COMMUNITY)
Admission: RE | Admit: 2018-04-28 | Discharge: 2018-04-28 | Disposition: A | Discharge status: Home / Self Care | Payer: Medicare Other | Source: Ambulatory Visit | Attending: Orthopedic Surgery | Admitting: Orthopedic Surgery

## 2018-04-28 DIAGNOSIS — E039 Hypothyroidism, unspecified: Secondary | ICD-10-CM | POA: Insufficient documentation

## 2018-04-28 DIAGNOSIS — I10 Essential (primary) hypertension: Secondary | ICD-10-CM | POA: Insufficient documentation

## 2018-04-28 DIAGNOSIS — M1711 Unilateral primary osteoarthritis, right knee: Secondary | ICD-10-CM | POA: Insufficient documentation

## 2018-04-28 DIAGNOSIS — Z01818 Encounter for other preprocedural examination: Secondary | ICD-10-CM | POA: Diagnosis not present

## 2018-04-28 DIAGNOSIS — M109 Gout, unspecified: Secondary | ICD-10-CM | POA: Diagnosis not present

## 2018-04-28 DIAGNOSIS — K219 Gastro-esophageal reflux disease without esophagitis: Secondary | ICD-10-CM | POA: Diagnosis not present

## 2018-04-28 DIAGNOSIS — E119 Type 2 diabetes mellitus without complications: Secondary | ICD-10-CM | POA: Diagnosis not present

## 2018-04-28 DIAGNOSIS — Z791 Long term (current) use of non-steroidal anti-inflammatories (NSAID): Secondary | ICD-10-CM | POA: Insufficient documentation

## 2018-04-28 DIAGNOSIS — F419 Anxiety disorder, unspecified: Secondary | ICD-10-CM | POA: Diagnosis not present

## 2018-04-28 DIAGNOSIS — Z79899 Other long term (current) drug therapy: Secondary | ICD-10-CM | POA: Diagnosis not present

## 2018-04-28 DIAGNOSIS — I2510 Atherosclerotic heart disease of native coronary artery without angina pectoris: Secondary | ICD-10-CM | POA: Diagnosis not present

## 2018-04-28 DIAGNOSIS — F329 Major depressive disorder, single episode, unspecified: Secondary | ICD-10-CM | POA: Insufficient documentation

## 2018-04-28 DIAGNOSIS — Z7984 Long term (current) use of oral hypoglycemic drugs: Secondary | ICD-10-CM | POA: Insufficient documentation

## 2018-04-28 DIAGNOSIS — E785 Hyperlipidemia, unspecified: Secondary | ICD-10-CM | POA: Insufficient documentation

## 2018-04-28 DIAGNOSIS — I493 Ventricular premature depolarization: Secondary | ICD-10-CM | POA: Diagnosis not present

## 2018-04-28 HISTORY — DX: Type 2 diabetes mellitus without complications: E11.9

## 2018-04-28 HISTORY — DX: Presence of spectacles and contact lenses: Z97.3

## 2018-04-28 HISTORY — DX: Personal history of colonic polyps: Z86.010

## 2018-04-28 HISTORY — DX: Unilateral primary osteoarthritis, unspecified knee: M17.10

## 2018-04-28 HISTORY — DX: Gout, unspecified: M10.9

## 2018-04-28 HISTORY — DX: Personal history of colon polyps, unspecified: Z86.0100

## 2018-04-28 HISTORY — DX: Hyperlipidemia, unspecified: E78.5

## 2018-04-28 HISTORY — DX: Unspecified asthma, uncomplicated: J45.909

## 2018-04-28 HISTORY — DX: Personal history of other specified conditions: Z87.898

## 2018-04-28 HISTORY — DX: Essential (primary) hypertension: I10

## 2018-04-28 HISTORY — DX: Nonscarring hair loss, unspecified: L65.9

## 2018-04-28 HISTORY — DX: Gastro-esophageal reflux disease without esophagitis: K21.9

## 2018-04-28 HISTORY — DX: Osteoarthritis of knee, unspecified: M17.9

## 2018-04-28 HISTORY — DX: Hypothyroidism, unspecified: E03.9

## 2018-04-28 HISTORY — DX: Thyrotoxicosis with toxic multinodular goiter without thyrotoxic crisis or storm: E05.20

## 2018-04-28 HISTORY — DX: Presence of dental prosthetic device (complete) (partial): Z97.2

## 2018-04-28 HISTORY — DX: Dermatitis, unspecified: L30.9

## 2018-04-28 LAB — CBC WITH DIFFERENTIAL/PLATELET
BASOS ABS: 0 10*3/uL (ref 0.0–0.1)
Basophils Relative: 1 %
Eosinophils Absolute: 0.1 10*3/uL (ref 0.0–0.7)
Eosinophils Relative: 1 %
HEMATOCRIT: 37.6 % (ref 36.0–46.0)
HEMOGLOBIN: 12.2 g/dL (ref 12.0–15.0)
LYMPHS PCT: 27 %
Lymphs Abs: 1.7 10*3/uL (ref 0.7–4.0)
MCH: 26.8 pg (ref 26.0–34.0)
MCHC: 32.4 g/dL (ref 30.0–36.0)
MCV: 82.6 fL (ref 78.0–100.0)
MONO ABS: 0.6 10*3/uL (ref 0.1–1.0)
MONOS PCT: 9 %
NEUTROS ABS: 4 10*3/uL (ref 1.7–7.7)
Neutrophils Relative %: 62 %
Platelets: 312 10*3/uL (ref 150–400)
RBC: 4.55 MIL/uL (ref 3.87–5.11)
RDW: 15 % (ref 11.5–15.5)
WBC: 6.4 10*3/uL (ref 4.0–10.5)

## 2018-04-28 LAB — BASIC METABOLIC PANEL
ANION GAP: 8 (ref 5–15)
BUN: 22 mg/dL (ref 8–23)
CO2: 26 mmol/L (ref 22–32)
Calcium: 9.8 mg/dL (ref 8.9–10.3)
Chloride: 109 mmol/L (ref 98–111)
Creatinine, Ser: 1.06 mg/dL — ABNORMAL HIGH (ref 0.44–1.00)
GFR, EST NON AFRICAN AMERICAN: 53 mL/min — AB (ref >60)
GLUCOSE: 101 mg/dL — AB (ref 70–99)
POTASSIUM: 3.9 mmol/L (ref 3.5–5.1)
Sodium: 143 mmol/L (ref 135–145)

## 2018-04-28 LAB — URINALYSIS, ROUTINE W REFLEX MICROSCOPIC
BILIRUBIN URINE: NEGATIVE
Glucose, UA: NEGATIVE mg/dL
Ketones, ur: NEGATIVE mg/dL
LEUKOCYTES UA: NEGATIVE
Nitrite: NEGATIVE
PROTEIN: NEGATIVE mg/dL
Specific Gravity, Urine: 1.011 (ref 1.005–1.030)
pH: 6 (ref 5.0–8.0)

## 2018-04-28 LAB — GLUCOSE, CAPILLARY: Glucose-Capillary: 93 mg/dL (ref 70–99)

## 2018-04-28 LAB — SURGICAL PCR SCREEN
MRSA, PCR: NEGATIVE
Staphylococcus aureus: POSITIVE — AB

## 2018-04-28 LAB — PROTIME-INR
INR: 0.93
Prothrombin Time: 12.4 seconds (ref 11.4–15.2)

## 2018-04-28 LAB — ABO/RH: ABO/RH(D): O POS

## 2018-04-28 LAB — HEMOGLOBIN A1C
HEMOGLOBIN A1C: 5.7 % — AB (ref 4.8–5.6)
MEAN PLASMA GLUCOSE: 116.89 mg/dL

## 2018-04-28 LAB — APTT: aPTT: 31 seconds (ref 24–36)

## 2018-04-28 NOTE — Progress Notes (Addendum)
Final CXR dated 04-28-2018 in epic. EKG dated 01-19-2018 in epic. Routed PCR screen and UA results dated 04-28-2018 to dr Mayer Camel in epic. [place] cardiology note in epic 03-26-2018, dr Tressia Miners turner.

## 2018-04-30 DIAGNOSIS — M1711 Unilateral primary osteoarthritis, right knee: Secondary | ICD-10-CM | POA: Diagnosis present

## 2018-04-30 NOTE — H&P (Signed)
TOTAL KNEE ADMISSION H&P  Patient is being admitted for right total knee arthroplasty.  Subjective:  Chief Complaint:right knee pain.  HPI: Brooke [name], 67 y.o. female, has a history of pain and functional disability in the right knee due to arthritis and has failed non-surgical conservative treatments for greater than 12 weeks to includeNSAID's and/or analgesics, corticosteriod injections, use of assistive devices, weight reduction as appropriate and activity modification.  Onset of symptoms was gradual, starting several years ago with gradually worsening course since that time. The patient noted no past surgery on the right knee(s).  Patient currently rates pain in the right knee(s) at 10 out of 10 with activity. Patient has night pain, worsening of pain with activity and weight bearing, pain that interferes with activities of daily living, pain with passive range of motion, crepitus and joint swelling.  Patient has evidence of subchondral sclerosis, periarticular osteophytes and joint space narrowing by imaging studies.  There is no active infection.  Patient Active Problem List   Diagnosis Date Noted  . Degenerative arthritis of knee, bilateral 08/07/2017  . Depression with anxiety 07/19/2017  . Vertigo 01/12/2017  . Low back pain 01/12/2017  . SOB (shortness of breath) 12/02/2016  . Dysuria 09/12/2016  . Bilateral knee pain 02/15/2015  . Effusion of right knee 01/28/2015  . Asthma with exacerbation 11/09/2014  . Pronation deformity of both feet 09/29/2014  . Greater trochanteric bursitis of left hip 08/31/2014  . Degenerative arthritis of left knee 05/18/2014  . Ventricular tachycardia, polymorphic ([place]) 01/04/2014  . Abnormal MRI of head 12/04/2013  . Syncope 11/06/2013  . Chest pain 11/06/2013  . Headache(784.0) 11/06/2013  . Nausea with vomiting 11/06/2013  . Cough 08/28/2013  . Hypokalemia 08/18/2012  . Diastolic dysfunction 82/99/3716  . Leukocytosis 01/03/2011  . Toe  pain 12/20/2010  . Preventative health care 12/19/2010  . FATIGUE 09/05/2009  . Diabetes ([place]) 11/29/2008  . Gout 08/09/2008  . ANEMIA-NOS 02/09/2008  . GERD 02/09/2008  . ALOPECIA 12/30/2007  . Hyperlipidemia 10/01/2007  . Essential hypertension 02/26/2007  . COLONIC POLYPS, HX OF 02/26/2007  . GOITER NOS 09/19/2006  . Asthma 09/19/2006  . ECZEMA, ATOPIC DERMATITIS 09/19/2006  . Insomnia 09/19/2006   Past Medical History:  Diagnosis Date  . Alopecia   . Aortic atherosclerosis ([place])   . Asthma    FOLLOWED BY PCP  . Eczema   . GERD (gastroesophageal reflux disease)   . Gout    04-28-2018--- per pt stable , as been a while since last episode  . History of colon polyps   . History of syncope 2015  . Hyperlipidemia   . Hypertension   . Hypothyroidism   . OA (osteoarthritis) of knee    bilateral  . PVC's (premature ventricular contractions)   . Toxic multinodular goiter    03/ 2003  s/p RAI  . Type 2 diabetes mellitus ([place])    followed by pcp  . Ventricular tachycardia, polymorphic ([place]) 01/04/2014   primary cardiologist-- dr Tressia Miners turner (hx monitor 2015 showed couplet PVCs, as trigger)  . Wears glasses   . Wears partial dentures    upper    Past Surgical History:  Procedure Laterality Date  . ABDOMINAL HYSTERECTOMY  10/22/1999   WITH BSO  . CATARACT EXTRACTION W/ INTRAOCULAR LENS IMPLANT Left YRS AGO  . EXCISION ABDOMINAL WALL MASS  12-20-2005   dr Margot Chimes @[facility]    neruofibroma  . LAPAROSCOPIC CHOLECYSTECTOMY  10/01/2002   dr Margot Chimes @[facility]   . LEFT HEART  CATHETERIZATION WITH CORONARY ANGIOGRAM N/A 01/07/2014   Procedure: LEFT HEART CATHETERIZATION WITH CORONARY ANGIOGRAM;  Surgeon: Peter M Martinique, MD;  Location: [facility] CATH LAB;  Service: Cardiovascular;  Laterality: N/A;  . RASTELLI PROCEDURE  6/98 neg  . RENAL ARTERY STENT Left 11/2003   angioplasty and stenting  . RENAL ARTERY STENT Left 02/2005   re-stenting    No current facility-administered medications for this  encounter.    Current Outpatient Medications  Medication Sig Dispense Refill Last Dose  . albuterol (PROVENTIL HFA;VENTOLIN HFA) 108 (90 Base) MCG/ACT inhaler Inhale 2 puffs into the lungs every 6 (six) hours as needed for wheezing. 1 Inhaler 11 Taking  . allopurinol (ZYLOPRIM) 100 MG tablet Take 1 tablet (100 mg total) by mouth daily. (Patient taking differently: Take 100 mg by mouth every morning. ) 90 tablet 3 Taking  . amLODipine (NORVASC) 10 MG tablet Take 1 tablet (10 mg total) by mouth daily. (Patient taking differently: Take 10 mg by mouth every morning. ) 90 tablet 3 Taking  . citalopram (CELEXA) 20 MG tablet Take 1 tablet (20 mg total) by mouth daily. (Patient taking differently: Take 20 mg by mouth every morning. ) 90 tablet 3 Taking  . hydrochlorothiazide (HYDRODIURIL) 25 MG tablet Take 1 tablet (25 mg total) by mouth daily. (Patient taking differently: Take 25 mg by mouth every morning. ) 90 tablet 3 Taking  . ibuprofen (ADVIL,MOTRIN) 800 MG tablet TAKE 1 TABLET BY MOUTH 2 TIMES DAILY AS NEEDED AS PAIN (Patient taking differently: Take 800 mg by mouth 2 (two) times daily. TAKE 1 TABLET BY MOUTH 2 TIMES DAILY AS NEEDED AS PAIN) 60 tablet 2 Taking  . KLOR-CON M20 20 MEQ tablet TAKE 1 TABLET BY MOUTH TWICE A DAY (Patient taking differently: Take 20 mEq by mouth 2 (two) times daily. ) 60 tablet 11 Taking  . meclizine (ANTIVERT) 25 MG tablet Take 1 tablet (25 mg total) by mouth 3 (three) times daily as needed for dizziness. 30 tablet 5 Taking  . metFORMIN (GLUCOPHAGE-XR) 500 MG 24 hr tablet TAKE ONE TABLET BY MOUTH ONCE DAILY WITH  BREAKFAST (Patient taking differently: Take 500 mg by mouth daily with breakfast. TAKE ONE TABLET BY MOUTH ONCE DAILY WITH  BREAKFAST) 90 tablet 3 Taking  . omeprazole (PRILOSEC) 20 MG capsule Take 1 capsule (20 mg total) by mouth 2 (two) times daily. 60 capsule 11 Taking  . rosuvastatin (CRESTOR) 20 MG tablet Take 1 tablet (20 mg total) by mouth daily. (Patient  taking differently: Take 20 mg by mouth every morning. ) 90 tablet 3 Taking  . AMBULATORY NON FORMULARY MEDICATION Medication Name: Rolling walker 1 Device 0 Taking  . clonazePAM (KLONOPIN) 0.5 MG tablet Take 1 tablet (0.5 mg total) by mouth 2 (two) times daily as needed (vertigo). 30 tablet 2   . fluticasone (FLOVENT HFA) 110 MCG/ACT inhaler Inhale 2 puffs into the lungs 2 (two) times daily. (Patient taking differently: Inhale 2 puffs into the lungs 2 (two) times daily. ) 12 g 12 Taking  . gabapentin (NEURONTIN) 100 MG capsule Take 1 capsule (100 mg total) by mouth at bedtime. 90 capsule 1   . metoprolol succinate (TOPROL-XL) 100 MG 24 hr tablet Take 1 tablet (100 mg total) by mouth 2 (two) times daily. 180 tablet 3   . zolpidem (AMBIEN) 10 MG tablet Take 1 tablet (10 mg total) by mouth at bedtime as needed for sleep. 90 tablet 1    Allergies  Allergen Reactions  . Ace  Inhibitors Swelling       . Codeine Nausea And Vomiting and Rash  . Hydrocodone Nausea And Vomiting  . Tramadol Nausea And Vomiting    Social History   Tobacco Use  . Smoking status: Never Smoker  . Smokeless tobacco: Never Used  Substance Use Topics  . Alcohol use: No    Family History  Problem Relation Age of Onset  . Diabetes Mother   . Heart disease Father      Review of Systems  Constitutional: Positive for malaise/fatigue.  Eyes:       Poor vision  Cardiovascular:       HTN  Gastrointestinal:       Poor appetite  Genitourinary: Positive for frequency.       Poor bladder control  Musculoskeletal: Positive for joint pain and myalgias.  Neurological: Positive for dizziness.       Poor balance    Objective:  Physical Exam  Constitutional: She is oriented to person, place, and time. She appears well-developed and well-nourished.  HENT:  Head: Normocephalic and atraumatic.  Eyes: Pupils are equal, round, and reactive to light.  Neck: Normal range of motion. Neck supple.  Cardiovascular: Intact  distal pulses.  Respiratory: Effort normal.  Musculoskeletal: She exhibits tenderness.  the patient has reduced range of motion bilaterally in her knees.  She has a range from roughly 10 to 95.  Obvious valgus deformities bilaterally.  Obvious crepitus with range of motion.  Significant pain with palpation of both the medial lateral joint lines.  Her calves are soft and nontender.  Neurological: She is alert and oriented to person, place, and time.  Skin: Skin is warm and dry.  Psychiatric: She has a normal mood and affect. Her behavior is normal. Judgment and thought content normal.    Vital signs in last 24 hours:    Labs:   Estimated body mass index is 29.6 kg/m as calculated from the following:   Height as of 04/28/18: 5\' 3"  (1.6 m).   Weight as of 04/28/18: 75.8 kg.   Imaging Review Plain radiographs demonstrate  bilateral AP weightbearing, bilateral Rosenberg, lateral sunrise views of bilateral knees are taken and reviewed in office today.  Patient has end-stage arthritis lateral compartment of bilateral knees.  15 valgus deformities bilaterally.   Preoperative templating of the joint replacement has been completed, documented, and submitted to the Operating Room personnel in order to optimize intra-operative equipment management.   Anticipated LOS equal to or greater than 2 midnights due to - Age 37 and older with one or more of the following:  - Obesity  - Expected need for hospital services (PT, OT, Nursing) required for safe  discharge  - Anticipated need for postoperative skilled nursing care or inpatient rehab  - Active co-morbidities: Diabetes    Assessment/Plan:  End stage arthritis, right knee   The patient history, physical examination, clinical judgment of the provider and imaging studies are consistent with end stage degenerative joint disease of the right knee(s) and total knee arthroplasty is deemed medically necessary. The treatment options including  medical management, injection therapy arthroscopy and arthroplasty were discussed at length. The risks and benefits of total knee arthroplasty were presented and reviewed. The risks due to aseptic loosening, infection, stiffness, patella tracking problems, thromboembolic complications and other imponderables were discussed. The patient acknowledged the explanation, agreed to proceed with the plan and consent was signed. Patient is being admitted for inpatient treatment for surgery, pain control, PT, OT, prophylactic antibiotics,  VTE prophylaxis, progressive ambulation and ADL's and discharge planning. The patient is planning to be discharged home with home health services.

## 2018-05-04 MED ORDER — TRANEXAMIC ACID 1000 MG/10ML IV SOLN
2000.0000 mg | INTRAVENOUS | Status: DC
  Filled 2018-05-04: qty 20

## 2018-05-04 MED ORDER — BUPIVACAINE LIPOSOME 1.3 % IJ SUSP
20.0000 mL | Freq: Once | INTRAMUSCULAR | Status: DC
  Filled 2018-05-04: qty 20

## 2018-05-05 ENCOUNTER — Encounter (HOSPITAL_COMMUNITY)
Admission: AD | Disposition: A | Discharge status: Skilled Nursing Facility | Payer: Self-pay | Source: Ambulatory Visit | Attending: Orthopedic Surgery

## 2018-05-05 ENCOUNTER — Ambulatory Visit (HOSPITAL_COMMUNITY): Payer: Medicare Other | Admitting: Anesthesiology

## 2018-05-05 ENCOUNTER — Inpatient Hospital Stay (HOSPITAL_COMMUNITY)
Admission: AD | Admit: 2018-05-05 | Discharge: 2018-05-08 | DRG: 470 | Disposition: A | Discharge status: Skilled Nursing Facility | Payer: Medicare Other | Source: Ambulatory Visit | Attending: Orthopedic Surgery | Admitting: Orthopedic Surgery

## 2018-05-05 ENCOUNTER — Other Ambulatory Visit: Payer: Self-pay

## 2018-05-05 ENCOUNTER — Encounter (HOSPITAL_COMMUNITY): Payer: Self-pay | Admitting: *Deleted

## 2018-05-05 DIAGNOSIS — E785 Hyperlipidemia, unspecified: Secondary | ICD-10-CM | POA: Diagnosis present

## 2018-05-05 DIAGNOSIS — Z888 Allergy status to other drugs, medicaments and biological substances status: Secondary | ICD-10-CM

## 2018-05-05 DIAGNOSIS — Z7951 Long term (current) use of inhaled steroids: Secondary | ICD-10-CM

## 2018-05-05 DIAGNOSIS — E039 Hypothyroidism, unspecified: Secondary | ICD-10-CM | POA: Diagnosis not present

## 2018-05-05 DIAGNOSIS — K219 Gastro-esophageal reflux disease without esophagitis: Secondary | ICD-10-CM | POA: Diagnosis not present

## 2018-05-05 DIAGNOSIS — M1711 Unilateral primary osteoarthritis, right knee: Secondary | ICD-10-CM | POA: Diagnosis not present

## 2018-05-05 DIAGNOSIS — Z8719 Personal history of other diseases of the digestive system: Secondary | ICD-10-CM

## 2018-05-05 DIAGNOSIS — M109 Gout, unspecified: Secondary | ICD-10-CM | POA: Diagnosis present

## 2018-05-05 DIAGNOSIS — Z6829 Body mass index (BMI) 29.0-29.9, adult: Secondary | ICD-10-CM

## 2018-05-05 DIAGNOSIS — I700 Atherosclerosis of aorta: Secondary | ICD-10-CM | POA: Diagnosis present

## 2018-05-05 DIAGNOSIS — Z833 Family history of diabetes mellitus: Secondary | ICD-10-CM

## 2018-05-05 DIAGNOSIS — E119 Type 2 diabetes mellitus without complications: Secondary | ICD-10-CM | POA: Diagnosis present

## 2018-05-05 DIAGNOSIS — Z9049 Acquired absence of other specified parts of digestive tract: Secondary | ICD-10-CM

## 2018-05-05 DIAGNOSIS — L209 Atopic dermatitis, unspecified: Secondary | ICD-10-CM | POA: Diagnosis not present

## 2018-05-05 DIAGNOSIS — Z8249 Family history of ischemic heart disease and other diseases of the circulatory system: Secondary | ICD-10-CM | POA: Diagnosis not present

## 2018-05-05 DIAGNOSIS — Z7984 Long term (current) use of oral hypoglycemic drugs: Secondary | ICD-10-CM | POA: Diagnosis not present

## 2018-05-05 DIAGNOSIS — L659 Nonscarring hair loss, unspecified: Secondary | ICD-10-CM | POA: Diagnosis present

## 2018-05-05 DIAGNOSIS — Z9842 Cataract extraction status, left eye: Secondary | ICD-10-CM | POA: Diagnosis not present

## 2018-05-05 DIAGNOSIS — E669 Obesity, unspecified: Secondary | ICD-10-CM | POA: Diagnosis present

## 2018-05-05 DIAGNOSIS — M170 Bilateral primary osteoarthritis of knee: Principal | ICD-10-CM | POA: Diagnosis present

## 2018-05-05 DIAGNOSIS — Z885 Allergy status to narcotic agent status: Secondary | ICD-10-CM

## 2018-05-05 DIAGNOSIS — Z90710 Acquired absence of both cervix and uterus: Secondary | ICD-10-CM

## 2018-05-05 DIAGNOSIS — Z961 Presence of intraocular lens: Secondary | ICD-10-CM | POA: Diagnosis present

## 2018-05-05 DIAGNOSIS — I10 Essential (primary) hypertension: Secondary | ICD-10-CM | POA: Diagnosis present

## 2018-05-05 DIAGNOSIS — D62 Acute posthemorrhagic anemia: Secondary | ICD-10-CM | POA: Diagnosis not present

## 2018-05-05 DIAGNOSIS — J45909 Unspecified asthma, uncomplicated: Secondary | ICD-10-CM | POA: Diagnosis present

## 2018-05-05 DIAGNOSIS — G8918 Other acute postprocedural pain: Secondary | ICD-10-CM | POA: Diagnosis not present

## 2018-05-05 HISTORY — PX: TOTAL KNEE ARTHROPLASTY: SHX125

## 2018-05-05 LAB — GLUCOSE, CAPILLARY
GLUCOSE-CAPILLARY: 95 mg/dL (ref 70–99)
Glucose-Capillary: 92 mg/dL (ref 70–99)
Glucose-Capillary: 136 mg/dL — ABNORMAL HIGH (ref 70–99)
Glucose-Capillary: 187 mg/dL — ABNORMAL HIGH (ref 70–99)
Glucose-Capillary: 158 mg/dL — ABNORMAL HIGH (ref 70–99)

## 2018-05-05 LAB — HEMOGLOBIN A1C
Hgb A1c MFr Bld: 5.6 % (ref 4.8–5.6)
MEAN PLASMA GLUCOSE: 114.02 mg/dL

## 2018-05-05 LAB — TYPE AND SCREEN
ABO/RH(D): O POS
Antibody Screen: NEGATIVE

## 2018-05-05 SURGERY — ARTHROPLASTY, KNEE, TOTAL
Anesthesia: Spinal | Site: Knee | Laterality: Right

## 2018-05-05 MED ORDER — OXYCODONE HCL 5 MG PO TABS
5.0000 mg | ORAL_TABLET | ORAL | Status: DC | PRN
  Administered 2018-05-05 (×2): 5 mg via ORAL
  Administered 2018-05-06: 10 mg via ORAL
  Administered 2018-05-06: 5 mg via ORAL
  Administered 2018-05-06 – 2018-05-07 (×3): 10 mg via ORAL
  Administered 2018-05-07: 5 mg via ORAL
  Administered 2018-05-07 – 2018-05-08 (×3): 10 mg via ORAL
  Filled 2018-05-05: qty 2
  Filled 2018-05-05: qty 1
  Filled 2018-05-05: qty 2
  Filled 2018-05-05 (×3): qty 1
  Filled 2018-05-05 (×5): qty 2

## 2018-05-05 MED ORDER — WATER FOR IRRIGATION, STERILE IR SOLN
Status: DC | PRN
  Administered 2018-05-05: 2000 mL

## 2018-05-05 MED ORDER — OXYCODONE HCL 5 MG/5ML PO SOLN
5.0000 mg | Freq: Once | ORAL | Status: DC | PRN
  Filled 2018-05-05: qty 5

## 2018-05-05 MED ORDER — MENTHOL 3 MG MT LOZG: 1.0000 | LOZENGE | OROMUCOSAL | Status: DC | PRN

## 2018-05-05 MED ORDER — EPHEDRINE SULFATE-NACL 50-0.9 MG/10ML-% IV SOSY
PREFILLED_SYRINGE | INTRAVENOUS | Status: DC | PRN
  Administered 2018-05-05 (×4): 5 mg via INTRAVENOUS

## 2018-05-05 MED ORDER — METOCLOPRAMIDE HCL 5 MG PO TABS: 5.0000 mg | ORAL_TABLET | Freq: Three times a day (TID) | ORAL | Status: DC | PRN

## 2018-05-05 MED ORDER — PANTOPRAZOLE SODIUM 40 MG PO TBEC
40.0000 mg | DELAYED_RELEASE_TABLET | Freq: Every day | ORAL | Status: DC
  Administered 2018-05-06 – 2018-05-08 (×3): 40 mg via ORAL
  Filled 2018-05-05 (×3): qty 1

## 2018-05-05 MED ORDER — FLEET ENEMA 7-19 GM/118ML RE ENEM: 1.0000 | ENEMA | Freq: Once | RECTAL | Status: DC | PRN

## 2018-05-05 MED ORDER — METHOCARBAMOL 500 MG PO TABS
500.0000 mg | ORAL_TABLET | Freq: Four times a day (QID) | ORAL | Status: DC | PRN
  Administered 2018-05-06 – 2018-05-07 (×3): 500 mg via ORAL
  Filled 2018-05-05 (×3): qty 1

## 2018-05-05 MED ORDER — SODIUM CHLORIDE 0.9 % IJ SOLN
INTRAMUSCULAR | Status: AC
  Filled 2018-05-05: qty 50

## 2018-05-05 MED ORDER — ROSUVASTATIN CALCIUM 20 MG PO TABS
20.0000 mg | ORAL_TABLET | Freq: Every morning | ORAL | Status: DC
  Administered 2018-05-06 – 2018-05-08 (×3): 20 mg via ORAL
  Filled 2018-05-05 (×3): qty 1

## 2018-05-05 MED ORDER — CLONAZEPAM 0.5 MG PO TABS: 0.5000 mg | ORAL_TABLET | Freq: Two times a day (BID) | ORAL | Status: DC | PRN

## 2018-05-05 MED ORDER — OXYCODONE-ACETAMINOPHEN 5-325 MG PO TABS: 1.0000 | ORAL_TABLET | ORAL | 0 refills | Status: DC | PRN

## 2018-05-05 MED ORDER — MECLIZINE HCL 25 MG PO TABS: 25.0000 mg | ORAL_TABLET | Freq: Three times a day (TID) | ORAL | Status: DC | PRN

## 2018-05-05 MED ORDER — TRANEXAMIC ACID 1000 MG/10ML IV SOLN
INTRAVENOUS | Status: DC | PRN
  Administered 2018-05-05: 2000 mg via TOPICAL

## 2018-05-05 MED ORDER — METFORMIN HCL ER 500 MG PO TB24
500.0000 mg | ORAL_TABLET | Freq: Every day | ORAL | Status: DC
  Administered 2018-05-06 – 2018-05-08 (×3): 500 mg via ORAL
  Filled 2018-05-05 (×3): qty 1

## 2018-05-05 MED ORDER — CHLORHEXIDINE GLUCONATE 4 % EX LIQD: 60.0000 mL | Freq: Once | CUTANEOUS | Status: DC

## 2018-05-05 MED ORDER — TRANEXAMIC ACID-NACL 1000-0.7 MG/100ML-% IV SOLN: 1000.0000 mg | INTRAVENOUS | Status: DC

## 2018-05-05 MED ORDER — ALUM & MAG HYDROXIDE-SIMETH 200-200-20 MG/5ML PO SUSP: 30.0000 mL | ORAL | Status: DC | PRN

## 2018-05-05 MED ORDER — PHENYLEPHRINE 40 MCG/ML (10ML) SYRINGE FOR IV PUSH (FOR BLOOD PRESSURE SUPPORT)
PREFILLED_SYRINGE | INTRAVENOUS | Status: AC
  Filled 2018-05-05: qty 10

## 2018-05-05 MED ORDER — LACTATED RINGERS IV SOLN
INTRAVENOUS | Status: DC
  Administered 2018-05-05 (×2): via INTRAVENOUS

## 2018-05-05 MED ORDER — ONDANSETRON HCL 4 MG/2ML IJ SOLN
INTRAMUSCULAR | Status: AC
  Filled 2018-05-05: qty 2

## 2018-05-05 MED ORDER — DEXAMETHASONE SODIUM PHOSPHATE 10 MG/ML IJ SOLN
INTRAMUSCULAR | Status: DC | PRN
  Administered 2018-05-05: 10 mg via INTRAVENOUS

## 2018-05-05 MED ORDER — ACETAMINOPHEN 325 MG PO TABS
ORAL_TABLET | ORAL | Status: AC
  Filled 2018-05-05: qty 2

## 2018-05-05 MED ORDER — DEXAMETHASONE SODIUM PHOSPHATE 10 MG/ML IJ SOLN
INTRAMUSCULAR | Status: AC
  Filled 2018-05-05: qty 1

## 2018-05-05 MED ORDER — GABAPENTIN 100 MG PO CAPS: 100.0000 mg | ORAL_CAPSULE | Freq: Every day | ORAL | Status: DC

## 2018-05-05 MED ORDER — METOCLOPRAMIDE HCL 5 MG/ML IJ SOLN: 5.0000 mg | Freq: Three times a day (TID) | INTRAMUSCULAR | Status: DC | PRN

## 2018-05-05 MED ORDER — TRANEXAMIC ACID-NACL 1000-0.7 MG/100ML-% IV SOLN
1000.0000 mg | INTRAVENOUS | Status: AC
  Administered 2018-05-05: 1000 mg via INTRAVENOUS
  Filled 2018-05-05 (×2): qty 100

## 2018-05-05 MED ORDER — CELECOXIB 200 MG PO CAPS
200.0000 mg | ORAL_CAPSULE | Freq: Two times a day (BID) | ORAL | Status: DC
  Administered 2018-05-05 – 2018-05-08 (×6): 200 mg via ORAL
  Filled 2018-05-05 (×6): qty 1

## 2018-05-05 MED ORDER — POLYETHYLENE GLYCOL 3350 17 G PO PACK: 17.0000 g | PACK | Freq: Every day | ORAL | Status: DC | PRN

## 2018-05-05 MED ORDER — ONDANSETRON HCL 4 MG/2ML IJ SOLN
INTRAMUSCULAR | Status: DC | PRN
  Administered 2018-05-05: 4 mg via INTRAVENOUS

## 2018-05-05 MED ORDER — BUPIVACAINE LIPOSOME 1.3 % IJ SUSP
INTRAMUSCULAR | Status: DC | PRN
  Administered 2018-05-05: 20 mL

## 2018-05-05 MED ORDER — ONDANSETRON HCL 4 MG PO TABS
4.0000 mg | ORAL_TABLET | Freq: Four times a day (QID) | ORAL | Status: DC | PRN
  Administered 2018-05-06: 4 mg via ORAL
  Filled 2018-05-05: qty 1

## 2018-05-05 MED ORDER — BISACODYL 5 MG PO TBEC: 5.0000 mg | DELAYED_RELEASE_TABLET | Freq: Every day | ORAL | Status: DC | PRN

## 2018-05-05 MED ORDER — INSULIN ASPART 100 UNIT/ML ~~LOC~~ SOLN
0.0000 [IU] | Freq: Three times a day (TID) | SUBCUTANEOUS | Status: DC
  Administered 2018-05-05: 3 [IU] via SUBCUTANEOUS
  Administered 2018-05-06: 2 [IU] via SUBCUTANEOUS

## 2018-05-05 MED ORDER — PHENYLEPHRINE 40 MCG/ML (10ML) SYRINGE FOR IV PUSH (FOR BLOOD PRESSURE SUPPORT)
PREFILLED_SYRINGE | INTRAVENOUS | Status: DC | PRN
  Administered 2018-05-05: 40 ug via INTRAVENOUS

## 2018-05-05 MED ORDER — HYDROMORPHONE HCL 1 MG/ML IJ SOLN: 0.5000 mg | INTRAMUSCULAR | Status: DC | PRN

## 2018-05-05 MED ORDER — ASPIRIN 81 MG PO CHEW
81.0000 mg | CHEWABLE_TABLET | Freq: Two times a day (BID) | ORAL | Status: DC
  Administered 2018-05-05 – 2018-05-08 (×6): 81 mg via ORAL
  Filled 2018-05-05 (×6): qty 1

## 2018-05-05 MED ORDER — LIDOCAINE 2% (20 MG/ML) 5 ML SYRINGE
INTRAMUSCULAR | Status: AC
  Filled 2018-05-05: qty 5

## 2018-05-05 MED ORDER — METHOCARBAMOL 500 MG IVPB - SIMPLE MED
500.0000 mg | Freq: Four times a day (QID) | INTRAVENOUS | Status: DC | PRN
  Filled 2018-05-05: qty 50

## 2018-05-05 MED ORDER — CEFAZOLIN SODIUM-DEXTROSE 2-4 GM/100ML-% IV SOLN
2.0000 g | INTRAVENOUS | Status: AC
  Administered 2018-05-05: 2 g via INTRAVENOUS
  Filled 2018-05-05: qty 100

## 2018-05-05 MED ORDER — SODIUM CHLORIDE 0.9 % IR SOLN
Status: DC | PRN
  Administered 2018-05-05: 1000 mL

## 2018-05-05 MED ORDER — OXYCODONE HCL 5 MG PO TABS: 5.0000 mg | ORAL_TABLET | Freq: Once | ORAL | Status: DC | PRN

## 2018-05-05 MED ORDER — PROPOFOL 10 MG/ML IV BOLUS
INTRAVENOUS | Status: AC
  Filled 2018-05-05: qty 60

## 2018-05-05 MED ORDER — HYDROCHLOROTHIAZIDE 25 MG PO TABS
25.0000 mg | ORAL_TABLET | Freq: Every morning | ORAL | Status: DC
  Administered 2018-05-05 – 2018-05-08 (×3): 25 mg via ORAL
  Filled 2018-05-05 (×4): qty 1

## 2018-05-05 MED ORDER — PROPOFOL 10 MG/ML IV BOLUS
INTRAVENOUS | Status: DC | PRN
  Administered 2018-05-05: 10 mg via INTRAVENOUS
  Administered 2018-05-05: 20 mg via INTRAVENOUS
  Administered 2018-05-05: 10 mg via INTRAVENOUS

## 2018-05-05 MED ORDER — POTASSIUM CHLORIDE CRYS ER 20 MEQ PO TBCR
20.0000 meq | EXTENDED_RELEASE_TABLET | Freq: Two times a day (BID) | ORAL | Status: DC
  Administered 2018-05-05 – 2018-05-08 (×6): 20 meq via ORAL
  Filled 2018-05-05 (×7): qty 1

## 2018-05-05 MED ORDER — SODIUM CHLORIDE 0.9 % IJ SOLN
INTRAMUSCULAR | Status: DC | PRN
  Administered 2018-05-05: 50 mL via INTRAVENOUS

## 2018-05-05 MED ORDER — DOCUSATE SODIUM 100 MG PO CAPS
100.0000 mg | ORAL_CAPSULE | Freq: Two times a day (BID) | ORAL | Status: DC
  Administered 2018-05-05 – 2018-05-08 (×6): 100 mg via ORAL
  Filled 2018-05-05 (×6): qty 1

## 2018-05-05 MED ORDER — PROPOFOL 500 MG/50ML IV EMUL
INTRAVENOUS | Status: DC | PRN
  Administered 2018-05-05: 45 ug/kg/min via INTRAVENOUS

## 2018-05-05 MED ORDER — ONDANSETRON HCL 4 MG/2ML IJ SOLN: 4.0000 mg | Freq: Four times a day (QID) | INTRAMUSCULAR | Status: DC | PRN

## 2018-05-05 MED ORDER — BUDESONIDE 0.5 MG/2ML IN SUSP
0.5000 mg | Freq: Two times a day (BID) | RESPIRATORY_TRACT | Status: DC
  Administered 2018-05-05 – 2018-05-08 (×6): 0.5 mg via RESPIRATORY_TRACT
  Filled 2018-05-05 (×6): qty 2

## 2018-05-05 MED ORDER — ZOLPIDEM TARTRATE 5 MG PO TABS
5.0000 mg | ORAL_TABLET | Freq: Every evening | ORAL | Status: DC | PRN
  Administered 2018-05-06: 5 mg via ORAL
  Filled 2018-05-05: qty 1

## 2018-05-05 MED ORDER — ALLOPURINOL 100 MG PO TABS
100.0000 mg | ORAL_TABLET | Freq: Every morning | ORAL | Status: DC
  Administered 2018-05-05 – 2018-05-08 (×4): 100 mg via ORAL
  Filled 2018-05-05 (×4): qty 1

## 2018-05-05 MED ORDER — PHENOL 1.4 % MT LIQD: 1.0000 | OROMUCOSAL | Status: DC | PRN

## 2018-05-05 MED ORDER — PROMETHAZINE HCL 25 MG/ML IJ SOLN: 6.2500 mg | INTRAMUSCULAR | Status: DC | PRN

## 2018-05-05 MED ORDER — ROPIVACAINE HCL 5 MG/ML IJ SOLN
INTRAMUSCULAR | Status: DC | PRN
  Administered 2018-05-05: 20 mL via PERINEURAL

## 2018-05-05 MED ORDER — BUPIVACAINE-EPINEPHRINE (PF) 0.5% -1:200000 IJ SOLN
INTRAMUSCULAR | Status: AC
  Filled 2018-05-05: qty 30

## 2018-05-05 MED ORDER — FENTANYL CITRATE (PF) 100 MCG/2ML IJ SOLN
50.0000 ug | INTRAMUSCULAR | Status: DC
  Administered 2018-05-05: 50 ug via INTRAVENOUS
  Filled 2018-05-05: qty 2

## 2018-05-05 MED ORDER — DIPHENHYDRAMINE HCL 12.5 MG/5ML PO ELIX: 12.5000 mg | ORAL_SOLUTION | ORAL | Status: DC | PRN

## 2018-05-05 MED ORDER — CITALOPRAM HYDROBROMIDE 20 MG PO TABS
20.0000 mg | ORAL_TABLET | Freq: Every morning | ORAL | Status: DC
  Administered 2018-05-06 – 2018-05-08 (×3): 20 mg via ORAL
  Filled 2018-05-05 (×3): qty 1

## 2018-05-05 MED ORDER — BUPIVACAINE-EPINEPHRINE (PF) 0.25% -1:200000 IJ SOLN
INTRAMUSCULAR | Status: DC | PRN
  Administered 2018-05-05: 30 mL via PERINEURAL

## 2018-05-05 MED ORDER — GABAPENTIN 300 MG PO CAPS
300.0000 mg | ORAL_CAPSULE | Freq: Three times a day (TID) | ORAL | Status: DC
  Administered 2018-05-05 – 2018-05-08 (×9): 300 mg via ORAL
  Filled 2018-05-05 (×9): qty 1

## 2018-05-05 MED ORDER — SODIUM CHLORIDE 0.9 % IJ SOLN
INTRAMUSCULAR | Status: AC
  Filled 2018-05-05: qty 10

## 2018-05-05 MED ORDER — EPHEDRINE 5 MG/ML INJ
INTRAVENOUS | Status: AC
  Filled 2018-05-05: qty 10

## 2018-05-05 MED ORDER — ASPIRIN EC 81 MG PO TBEC: 81.0000 mg | DELAYED_RELEASE_TABLET | Freq: Two times a day (BID) | ORAL | 0 refills | Status: DC

## 2018-05-05 MED ORDER — ZOLPIDEM TARTRATE 5 MG PO TABS: 5.0000 mg | ORAL_TABLET | Freq: Every evening | ORAL | Status: DC | PRN

## 2018-05-05 MED ORDER — MIDAZOLAM HCL 2 MG/2ML IJ SOLN
1.0000 mg | INTRAMUSCULAR | Status: DC
  Administered 2018-05-05: 1 mg via INTRAVENOUS
  Filled 2018-05-05: qty 2

## 2018-05-05 MED ORDER — KCL IN DEXTROSE-NACL 20-5-0.45 MEQ/L-%-% IV SOLN
INTRAVENOUS | Status: DC
  Administered 2018-05-05 (×2): via INTRAVENOUS
  Filled 2018-05-05 (×3): qty 1000

## 2018-05-05 MED ORDER — BUPIVACAINE LIPOSOME 1.3 % IJ SUSP
20.0000 mL | Freq: Once | INTRAMUSCULAR | Status: DC
  Filled 2018-05-05: qty 20

## 2018-05-05 MED ORDER — PANTOPRAZOLE SODIUM 40 MG PO TBEC: 40.0000 mg | DELAYED_RELEASE_TABLET | Freq: Every day | ORAL | Status: DC

## 2018-05-05 MED ORDER — HYDROMORPHONE HCL 1 MG/ML IJ SOLN: 0.2500 mg | INTRAMUSCULAR | Status: DC | PRN

## 2018-05-05 MED ORDER — BUPIVACAINE HCL (PF) 0.75 % IJ SOLN
INTRAMUSCULAR | Status: DC | PRN
  Administered 2018-05-05: 2 mL via INTRATHECAL

## 2018-05-05 MED ORDER — TRANEXAMIC ACID-NACL 1000-0.7 MG/100ML-% IV SOLN
1000.0000 mg | Freq: Once | INTRAVENOUS | Status: AC
  Administered 2018-05-05: 1000 mg via INTRAVENOUS
  Filled 2018-05-05: qty 100

## 2018-05-05 MED ORDER — ACETAMINOPHEN 325 MG PO TABS
325.0000 mg | ORAL_TABLET | Freq: Four times a day (QID) | ORAL | Status: DC | PRN
  Administered 2018-05-05: 650 mg via ORAL

## 2018-05-05 MED ORDER — TIZANIDINE HCL 2 MG PO TABS: 2.0000 mg | ORAL_TABLET | Freq: Four times a day (QID) | ORAL | 0 refills | Status: DC | PRN

## 2018-05-05 MED ORDER — ALBUTEROL SULFATE (2.5 MG/3ML) 0.083% IN NEBU: 2.5000 mg | INHALATION_SOLUTION | Freq: Four times a day (QID) | RESPIRATORY_TRACT | Status: DC | PRN

## 2018-05-05 MED ORDER — AMLODIPINE BESYLATE 10 MG PO TABS
10.0000 mg | ORAL_TABLET | Freq: Every morning | ORAL | Status: DC
  Administered 2018-05-07 – 2018-05-08 (×2): 10 mg via ORAL
  Filled 2018-05-05 (×3): qty 1

## 2018-05-05 SURGICAL SUPPLY — 52 items
ATTUNE PS FEM RT SZ 4 CEM KNEE (Femur) ×3 IMPLANT
ATTUNE PSRP INSR SZ4 5 KNEE (Insert) ×2 IMPLANT
ATTUNE PSRP INSR SZ4 5MM KNEE (Insert) ×1 IMPLANT
BAG DECANTER FOR FLEXI CONT (MISCELLANEOUS) ×3 IMPLANT
BAG ZIPLOCK 12X15 (MISCELLANEOUS) ×3 IMPLANT
BANDAGE ACE 6X5 VEL STRL LF (GAUZE/BANDAGES/DRESSINGS) ×3 IMPLANT
BASE TIBIAL ROT PLAT SZ 5 KNEE (Knees) ×1 IMPLANT
BLADE SAG 18X100X1.27 (BLADE) ×3 IMPLANT
BLADE SAW SGTL 11.0X1.19X90.0M (BLADE) ×3 IMPLANT
BNDG ELASTIC 6X10 VLCR STRL LF (GAUZE/BANDAGES/DRESSINGS) ×3 IMPLANT
BOWL SMART MIX CTS (DISPOSABLE) ×3 IMPLANT
CEMENT HV SMART SET (Cement) ×6 IMPLANT
COVER SURGICAL LIGHT HANDLE (MISCELLANEOUS) ×3 IMPLANT
COVER WAND RF STERILE (DRAPES) IMPLANT
CUFF TOURN SGL QUICK 34 (TOURNIQUET CUFF) ×2
CUFF TRNQT CYL 34X4X40X1 (TOURNIQUET CUFF) ×1 IMPLANT
DECANTER SPIKE VIAL GLASS SM (MISCELLANEOUS) ×9 IMPLANT
DRAPE U-SHAPE 47X51 STRL (DRAPES) ×3 IMPLANT
DRSG AQUACEL AG ADV 3.5X10 (GAUZE/BANDAGES/DRESSINGS) ×3 IMPLANT
DURAPREP 26ML APPLICATOR (WOUND CARE) ×3 IMPLANT
ELECT REM PT RETURN 15FT ADLT (MISCELLANEOUS) ×3 IMPLANT
GLOVE BIO SURGEON STRL SZ7.5 (GLOVE) ×3 IMPLANT
GLOVE BIO SURGEON STRL SZ8.5 (GLOVE) ×3 IMPLANT
GLOVE BIOGEL PI IND STRL 8 (GLOVE) ×1 IMPLANT
GLOVE BIOGEL PI IND STRL 9 (GLOVE) ×1 IMPLANT
GLOVE BIOGEL PI INDICATOR 8 (GLOVE) ×2
GLOVE BIOGEL PI INDICATOR 9 (GLOVE) ×2
GOWN STRL REUS W/TWL XL LVL3 (GOWN DISPOSABLE) ×6 IMPLANT
HANDPIECE INTERPULSE COAX TIP (DISPOSABLE) ×2
HOOD PEEL AWAY FLYTE STAYCOOL (MISCELLANEOUS) ×9 IMPLANT
IV KIT MINILOC 20X1 SAFETY (NEEDLE) ×3 IMPLANT
NS IRRIG 1000ML POUR BTL (IV SOLUTION) ×3 IMPLANT
PACK ICE MAXI GEL EZY WRAP (MISCELLANEOUS) ×3 IMPLANT
PACK TOTAL KNEE CUSTOM (KITS) ×3 IMPLANT
PATELLA MEDIAL ATTUN 35MM KNEE (Knees) ×3 IMPLANT
PIN STEINMAN FIXATION KNEE (PIN) ×3 IMPLANT
PIN THREADED HEADED SIGMA (PIN) ×3 IMPLANT
POSITIONER SURGICAL ARM (MISCELLANEOUS) ×3 IMPLANT
SET HNDPC FAN SPRY TIP SCT (DISPOSABLE) ×1 IMPLANT
STAPLER VISISTAT 35W (STAPLE) IMPLANT
SUT VIC AB 1 CTX 36 (SUTURE) ×2
SUT VIC AB 1 CTX36XBRD ANBCTR (SUTURE) ×1 IMPLANT
SUT VIC AB 2-0 CT1 27 (SUTURE) ×2
SUT VIC AB 2-0 CT1 TAPERPNT 27 (SUTURE) ×1 IMPLANT
SUT VIC AB 3-0 CT1 27 (SUTURE) ×2
SUT VIC AB 3-0 CT1 TAPERPNT 27 (SUTURE) ×1 IMPLANT
SYR CONTROL 10ML LL (SYRINGE) ×6 IMPLANT
TIBIAL BASE ROT PLAT SZ 5 KNEE (Knees) ×3 IMPLANT
TRAY FOLEY MTR SLVR 16FR STAT (SET/KITS/TRAYS/PACK) ×3 IMPLANT
WATER STERILE IRR 1000ML POUR (IV SOLUTION) ×6 IMPLANT
WRAP KNEE MAXI GEL POST OP (GAUZE/BANDAGES/DRESSINGS) ×3 IMPLANT
YANKAUER SUCT BULB TIP 10FT TU (MISCELLANEOUS) ×3 IMPLANT

## 2018-05-05 NOTE — Anesthesia Procedure Notes (Signed)
Anesthesia Regional Block: Adductor canal block   Pre-Anesthetic Checklist: ,, timeout performed, Correct Patient, Correct Site, Correct Laterality, Correct Procedure, Correct Position, site marked, Risks and benefits discussed,  Surgical consent,  Pre-op evaluation,  At surgeon's request and post-op pain management  Laterality: Right  Prep: chloraprep       Needles:  Injection technique: Single-shot  Needle Type: Stimiplex     Needle Length: 9cm  Needle Gauge: 21     Additional Needles:   Procedures:,,,, ultrasound used (permanent image in chart),,,,  Narrative:  Start time: 05/05/2018 7:46 AM End time: 05/05/2018 7:51 AM Injection made incrementally with aspirations every 5 mL.  Performed by: Personally  Anesthesiologist: Lynda Rainwater, MD

## 2018-05-05 NOTE — Anesthesia Postprocedure Evaluation (Signed)
Anesthesia Post Note  Patient: Greenly Rarick Witzke  Procedure(s) Performed: RIGHT TOTAL KNEE ARTHROPLASTY (Right Knee)     Patient location during evaluation: PACU Anesthesia Type: Spinal Level of consciousness: oriented and awake and alert Pain management: pain level controlled Vital Signs Assessment: post-procedure vital signs reviewed and stable Respiratory status: spontaneous breathing and respiratory function stable Cardiovascular status: blood pressure returned to baseline and stable Postop Assessment: no headache, no backache and no apparent nausea or vomiting Anesthetic complications: no    Last Vitals:  Vitals:   05/05/18 1224 05/05/18 1228  BP:  (!) 167/73  Pulse:  (!) 58  Resp: 16 16  Temp:  36.5 C  SpO2:  100%    Last Pain:  Vitals:   05/05/18 1228  TempSrc: Oral  PainSc:                  Lynda Rainwater

## 2018-05-05 NOTE — Transfer of Care (Signed)
Immediate Anesthesia Transfer of Care Note  Patient: Brooke [name]  Procedure(s) Performed: RIGHT TOTAL KNEE ARTHROPLASTY (Right Knee)  Patient Location: PACU  Anesthesia Type:MAC, Regional and Spinal  Level of Consciousness: awake, alert  and patient cooperative  Airway & Oxygen Therapy: Patient Spontanous Breathing and Patient connected to face mask oxygen  Post-op Assessment: Report given to RN and Post -op Vital signs reviewed and stable  Post vital signs: Reviewed and stable  Last Vitals:  Vitals Value Taken Time  BP 130/60 05/05/2018 10:35 AM  Temp 36.7 C 05/05/2018 10:35 AM  Pulse 61 05/05/2018 10:41 AM  Resp 13 05/05/2018 10:41 AM  SpO2 98 % 05/05/2018 10:41 AM  Vitals shown include unvalidated device data.  Last Pain:  Vitals:   05/05/18 0707  TempSrc:   PainSc: 4       Patients Stated Pain Goal: 4 (14/48/18 5631)  Complications: No apparent anesthesia complications

## 2018-05-05 NOTE — Interval H&P Note (Signed)
History and Physical Interval Note:  05/05/2018 7:11 AM  Brooke [name]  has presented today for surgery, with the diagnosis of RIGHT KNEE OSTEOARTHRITIS  The various methods of treatment have been discussed with the patient and family. After consideration of risks, benefits and other options for treatment, the patient has consented to  Procedure(s): RIGHT TOTAL KNEE ARTHROPLASTY (Right) as a surgical intervention .  The patient's history has been reviewed, patient examined, no change in status, stable for surgery.  I have reviewed the patient's chart and labs.  Questions were answered to the patient's satisfaction.     Kerin Salen

## 2018-05-05 NOTE — Anesthesia Preprocedure Evaluation (Signed)
Anesthesia Evaluation  Patient identified by MRN, date of birth, ID band Patient awake    Reviewed: Allergy & Precautions, NPO status , Patient's Chart, lab work & pertinent test results  Airway Mallampati: II  TM Distance: >3 FB Neck ROM: Full    Dental no notable dental hx.    Pulmonary asthma ,    Pulmonary exam normal breath sounds clear to auscultation       Cardiovascular hypertension, Pt. on medications negative cardio ROS Normal cardiovascular exam Rhythm:Regular Rate:Normal     Neuro/Psych  Headaches, Anxiety Depression negative psych ROS   GI/Hepatic Neg liver ROS, GERD  ,  Endo/Other  negative endocrine ROSdiabetes, Type 2  Renal/GU negative Renal ROS  negative genitourinary   Musculoskeletal  (+) Arthritis , Osteoarthritis,    Abdominal   Peds negative pediatric ROS (+)  Hematology negative hematology ROS (+)   Anesthesia Other Findings   Reproductive/Obstetrics negative OB ROS                             Anesthesia Physical Anesthesia Plan  ASA: III  Anesthesia Plan: Spinal   Post-op Pain Management:  Regional for Post-op pain   Induction: Intravenous  PONV Risk Score and Plan: 2 and Ondansetron and Midazolam  Airway Management Planned: Simple Face Mask  Additional Equipment:   Intra-op Plan:   Post-operative Plan:   Informed Consent: I have reviewed the patients History and Physical, chart, labs and discussed the procedure including the risks, benefits and alternatives for the proposed anesthesia with the patient or authorized representative who has indicated his/her understanding and acceptance.   Dental advisory given  Plan Discussed with: CRNA  Anesthesia Plan Comments:         Anesthesia Quick Evaluation

## 2018-05-05 NOTE — Evaluation (Signed)
Physical Therapy Evaluation Patient Details Name: Brooke [name] MRN: [id number] DOB: 1950/12/04 Today's Date: 05/05/2018   History of Present Illness  67 YO female s/p R TKR on 05/05/18. PMH includes depression/anxiety, vertigo, LBP, pronation deformity of bilat LEs, vtach 2015 brought on by PVCs, syncope, diastolic dysfunction, DMII, HTN, HLD, atherosclerosis, renal artery stenosis and stenting 2005-2006, lap chole.   Clinical Impression   Pt presents with difficulty performing mobility tasks, R knee weakness and ROM impairments, and decreased tolerance for ambulation. Pt with nausea after hallway ambulation, so ambulation terminated in the hallway and pt wheeled back to her in room her recliner. Pt to benefit from acute PT to address deficits. PT recommending SNF, given pt's lack of 24/7 care if d/ced home, lack of assistance at home, and her mobility deficits. Pt is also the primary caregiver for her husband at home, and she is not able to provide total care for him at this time.     Follow Up Recommendations Follow surgeon's recommendation for DC plan and follow-up therapies;Supervision for mobility/OOB(SNF )    Equipment Recommendations  None recommended by PT    Recommendations for Other Services       Precautions / Restrictions Precautions Precautions: Fall;Knee Restrictions Weight Bearing Restrictions: No Other Position/Activity Restrictions: WBAT       Mobility  Bed Mobility Overal bed mobility: Needs Assistance Bed Mobility: Supine to Sit     Supine to sit: HOB elevated;Min assist     General bed mobility comments: Min assist for RLE management, scooting to EOB. Increased time/effort. Verbal cuing for sequencing to EOB.   Transfers Overall transfer level: Needs assistance Equipment used: Rolling walker (2 wheeled) Transfers: Sit to/from Stand Sit to Stand: Min assist;From elevated surface         General transfer comment: Min assist for power up, steadying  upon standing; increased time to come to fully erect posture. Verbal cuing for hand placement. Pt weight shifted R and L without any RLE buckling.   Ambulation/Gait Ambulation/Gait assistance: +2 safety/equipment;Min guard Gait Distance (Feet): 45 Feet Assistive device: Rolling walker (2 wheeled) Gait Pattern/deviations: Step-to pattern;Decreased stride length;Decreased weight shift to right Gait velocity: decr    General Gait Details: Min guard for safety. Verbal cuing for placement in RW, sequencing. After 45 ft ambulation, pt stated she felt nauseous all of the sudden. Pt with chair follow, so pt sat immediately and provided with emesis bag. Pt with resolving nausea upon sitting after 1-2 minutes. Pt wheeled back to room, reclined in recliner, given a cool cloth for forehead, and felt better.   Stairs            Wheelchair Mobility    Modified Rankin (Stroke Patients Only)       Balance Overall balance assessment: Mild deficits observed, not formally tested                                           Pertinent Vitals/Pain Pain Assessment: 0-10 Pain Score: 0-No pain Pain Intervention(s): Limited activity within patient's tolerance;Monitored during session    [place] expects to be discharged to:: Private residence Living Arrangements: Spouse/significant other Available Help at Discharge: Family;Available PRN/intermittently(Son will be assisting intermittently ) Type of Home: Apartment Home Access: Level entry     Home Layout: One level Home Equipment: Walker - 2 wheels;Cane - single point;Tub bench  Prior Function Level of Independence: Independent with assistive device(s)         Comments: Using cane for ambulation, for longer distance ambulation would use RW. Husband is total care, and pt is primary caregiver for husband. Pt requesting rehab for PT.      Hand Dominance   Dominant Hand: Right    Extremity/Trunk  Assessment   Upper Extremity Assessment Upper Extremity Assessment: Overall WFL for tasks assessed    Lower Extremity Assessment Lower Extremity Assessment: Overall WFL for tasks assessed;RLE deficits/detail RLE Deficits / Details: Suspected post-surgical weakness; able to perform quad set x1, SLR with assist during bed mobility  RLE Sensation: WNL    Cervical / Trunk Assessment Cervical / Trunk Assessment: Normal  Communication   Communication: No difficulties  Cognition Arousal/Alertness: Awake/alert Behavior During Therapy: WFL for tasks assessed/performed Overall Cognitive Status: Within Functional Limits for tasks assessed                                        General Comments      Exercises Total Joint Exercises Ankle Circles/Pumps: AROM;Both;5 reps;Seated   Assessment/Plan    PT Assessment Patient needs continued PT services  PT Problem List Decreased strength;Pain;Decreased range of motion;Decreased activity tolerance;Decreased knowledge of use of DME;Decreased balance;Decreased safety awareness;Decreased mobility       PT Treatment Interventions DME instruction;Therapeutic activities;Gait training;Therapeutic exercise;Patient/family education;Balance training;Functional mobility training    PT Goals (Current goals can be found in the Care Plan section)  Acute Rehab PT Goals Patient Stated Goal: go to rehab  PT Goal Formulation: With patient Time For Goal Achievement: 05/12/18 Potential to Achieve Goals: Good    Frequency 7X/week   Barriers to discharge        Co-evaluation               AM-PAC PT "6 Clicks" Daily Activity  Outcome Measure Difficulty turning over in bed (including adjusting bedclothes, sheets and blankets)?: Unable Difficulty moving from lying on back to sitting on the side of the bed? : Unable Difficulty sitting down on and standing up from a chair with arms (e.g., wheelchair, bedside commode, etc,.)?:  Unable Help needed moving to and from a bed to chair (including a wheelchair)?: A Little Help needed walking in hospital room?: A Little Help needed climbing 3-5 steps with a railing? : A Little 6 Click Score: 12    End of Session Equipment Utilized During Treatment: Gait belt Activity Tolerance: Other (comment)(Pt limited by feelings of nausea ) Patient left: in chair;with chair alarm set;with call bell/phone within reach;with SCD's reapplied Nurse Communication: Mobility status PT Visit Diagnosis: Other abnormalities of gait and mobility (R26.89);Difficulty in walking, not elsewhere classified (R26.2)    Time: 3893-7342 PT Time Calculation (min) (ACUTE ONLY): 28 min   Charges:   PT Evaluation $PT Eval Low Complexity: 1 Low PT Treatments $Gait Training: 8-22 mins        Julien Girt, PT Acute Rehabilitation Services Pager [phone]  Office [phone]   Brooke [name] 05/05/2018, 7:02 PM

## 2018-05-05 NOTE — Anesthesia Procedure Notes (Signed)
Procedure Name: MAC Date/Time: 05/05/2018 8:30 AM Performed by: Dione Booze, CRNA Pre-anesthesia Checklist: Patient identified, Emergency Drugs available, Suction available and Patient being monitored Oxygen Delivery Method: Simple face mask Placement Confirmation: positive ETCO2

## 2018-05-05 NOTE — Anesthesia Procedure Notes (Signed)
Performed by: Aayra Hornbaker, CRNA       

## 2018-05-05 NOTE — Discharge Instructions (Signed)

## 2018-05-05 NOTE — Care Management Obs Status (Signed)
[place] NOTIFICATION   Patient Details  Name: Brooke [name] MRN: [id number] Date of Birth: 05-12-1951   Medicare Observation Status Notification Given:  Yes    Guadalupe Maple, RN 05/05/2018, 2:17 PM

## 2018-05-05 NOTE — Anesthesia Procedure Notes (Signed)
Spinal  Patient location during procedure: OR Start time: 05/05/2018 8:34 AM End time: 05/05/2018 8:39 AM Staffing Anesthesiologist: Lynda Rainwater, MD Performed: anesthesiologist  Preanesthetic Checklist Completed: patient identified, site marked, surgical consent, pre-op evaluation, timeout performed, IV checked, risks and benefits discussed and monitors and equipment checked Spinal Block Patient position: sitting Prep: Betadine and site prepped and draped Patient monitoring: heart rate, continuous pulse ox and blood pressure Approach: right paramedian Location: L3-4 Injection technique: single-shot Needle Needle type: Quincke  Needle gauge: 22 G Needle length: 9 cm

## 2018-05-05 NOTE — Op Note (Signed)
PATIENT ID:      Brooke [name]  MRN:     [id number] DOB/AGE:    1950-09-04 / 67 y.o.       OPERATIVE REPORT    DATE OF PROCEDURE:  05/05/2018       PREOPERATIVE DIAGNOSIS:   RIGHT KNEE OSTEOARTHRITIS      Estimated body mass index is 29.6 kg/m as calculated from the following:   Height as of this encounter: 5\' 3"  (1.6 m).   Weight as of this encounter: 75.8 kg.                                                        POSTOPERATIVE DIAGNOSIS:   RIGHT KNEE OSTEOARTHRITIS                                                                      PROCEDURE:  Procedure(s): RIGHT TOTAL KNEE ARTHROPLASTY Using DepuyAttune RP implants #4R Femur, #5Tibia, 5 mm Attune RP bearing, 35 Patella     SURGEON: Kerin Salen    ASSISTANT:   Kerry Hough. [employer]   (Present and scrubbed throughout the case, critical for assistance with exposure, retraction, instrumentation, and closure.)         ANESTHESIA: Spinal, 20cc Exparel, 50cc 0.25% Marcaine  EBL: 300cc  FLUID REPLACEMENT: 1500cc  Tourniquet Time: None  Drains: None  Tranexamic Acid: 1gm IV, 2gm topical  Exparel: 266mg    COMPLICATIONS:  None         INDICATIONS FOR PROCEDURE: The patient has  RIGHT KNEE OSTEOARTHRITIS, 15deg Val deformities, XR shows bone on bone arthritis, lateral subluxation of tibia. Patient has failed all conservative measures including anti-inflammatory medicines, narcotics, attempts at  exercise and weight loss, cortisone injections and viscosupplementation.  Risks and benefits of surgery have been discussed, questions answered.   DESCRIPTION OF PROCEDURE: The patient identified by armband, received  IV antibiotics, in the holding area at [facility]. Patient taken to the operating room, appropriate anesthetic  monitors were attached, and Spinal anesthesia was  induced. Tourniquet  applied high to the operative thigh. Lateral post and foot positioner  applied to the table, the lower extremity was then prepped and  draped  in usual sterile fashion from the toes to the tourniquet. Time-out procedure was performed. The skin and subcutaneous tissue along the incision was injected with 20 cc of a mixture of Exparel and Marcaine solution, using a 20-gauge by 1-1/2 inch needle. We began the operation, with the knee flexed 130 degrees, by making the anterior midline incision starting at handbreadth above the patella going over the patella 1 cm medial to and 4 cm distal to the tibial tubercle. Small bleeders in the skin and the  subcutaneous tissue identified and cauterized. Transverse retinaculum was incised and reflected medially and a medial parapatellar arthrotomy was accomplished. the patella was everted and theprepatellar fat pad resected. The superficial medial collateral  ligament was then elevated from anterior to posterior along the proximal  flare of the tibia and anterior half of the menisci resected. The knee was  hyperflexed exposing bone on bone arthritis. Peripheral and notch osteophytes as well as the cruciate ligaments were then resected. We continued to  work our way around posteriorly along the proximal tibia, and externally  rotated the tibia subluxing it out from underneath the femur. A McHale  retractor was placed through the notch and a lateral Hohmann retractor  placed, and we then drilled through the proximal tibia in line with the  axis of the tibia followed by an intramedullary guide rod and 2-degree  posterior slope cutting guide. The tibial cutting guide, 3 degree posterior sloped, was pinned into place allowing resection of 8 mm of bone medially and 3 mm of bone laterally. Satisfied with the tibial resection, we then  entered the distal femur 2 mm anterior to the PCL origin with the  intramedullary guide rod and applied the distal femoral cutting guide  set at 9 mm, with 7 degrees of valgus. This was pinned along the  epicondylar axis. At this point, the distal femoral cut was accomplished  without difficulty. We then sized for a #4R femoral component and pinned the guide in 3 degrees of external rotation. The chamfer cutting guide was pinned into place. The anterior, posterior, and chamfer cuts were accomplished without difficulty followed by  the Attune RP box cutting guide and the box cut. We also removed posterior osteophytes from the posterior femoral condyles. At this  time, the knee was brought into full extension. We checked our  extension and flexion gaps and found them symmetric for a 5 mm bearing. Distracting in extension with a lamina spreader, the posterior horns of the menisci were removed, and Exparel, diluted to 60 cc, with 20cc NS, and 20cc 0.5% Marcaine,was injected into the capsule and synovium of the knee. The posterior patella cut was accomplished with the 9.5 mm Attune cutting guide, sized for a 38mm dome, and the fixation pegs drilled.The knee  was then once again hyperflexed exposing the proximal tibia. We sized for a # 5 tibial base plate, applied the smokestack and the conical reamer followed by the the Delta fin keel punch. We then hammered into place the Attune RP trial femoral component, drilled the lugs, inserted a  5 mm trial bearing, trial patellar button, and took the knee through range of motion from 0-130 degrees. No thumb pressure was required for patellar Tracking. At this point, the limb was wrapped with an Esmarch bandage and the tourniquet inflated to 350 mmHg. All trial components were removed, mating surfaces irrigated with pulse lavage, and dried with suction and sponges. 10 cc of the Exparel solution was applied to the cancellus bone of the patella distal femur and proximal tibia.  After waiting 1 minute, the bony surfaces were again, dried with sponges. A double batch of DePuy HV cement with 1500 mg of Zinacef was mixed and applied to all bony metallic mating surfaces except for the posterior condyles of the femur itself. In order, we hammered into  place the tibial tray and removed excess cement, the femoral component and removed excess cement. The final Attune RP bearing  was inserted, and the knee brought to full extension with compression.  The patellar button was clamped into place, and excess cement  removed. While the cement cured the wound was irrigated out with normal saline solution pulse lavage. Ligament stability and patellar tracking were checked and found to be excellent. The parapatellar arthrotomy was closed with  running #1 Vicryl suture. The subcutaneous tissue with 0 and 2-0 undyed  Vicryl suture, and the skin with running 3-0 SQ vicryl. A dressing of Xeroform,  4 x 4, dressing sponges, Webril, and Ace wrap applied. The patient  awakened, and taken to recovery room without difficulty.   Kerin Salen 05/05/2018, 10:13 AM

## 2018-05-05 NOTE — Plan of Care (Signed)
Plan of care 

## 2018-05-05 NOTE — Care Management CC44 (Signed)
Condition Code 44 Documentation Completed  Patient Details  Name: SHAREN YOUNGREN MRN: [id number] Date of Birth: 10-Feb-1951   Condition Code 44 given:  Yes Patient signature on Condition Code 44 notice:  Yes Documentation of 2 MD's agreement:  Yes Code 44 added to claim:  Yes    Guadalupe Maple, RN 05/05/2018, 2:17 PM

## 2018-05-06 ENCOUNTER — Encounter (HOSPITAL_COMMUNITY): Payer: Self-pay | Admitting: Orthopedic Surgery

## 2018-05-06 DIAGNOSIS — M25561 Pain in right knee: Secondary | ICD-10-CM | POA: Diagnosis present

## 2018-05-06 DIAGNOSIS — Z885 Allergy status to narcotic agent status: Secondary | ICD-10-CM | POA: Diagnosis not present

## 2018-05-06 DIAGNOSIS — I700 Atherosclerosis of aorta: Secondary | ICD-10-CM | POA: Diagnosis not present

## 2018-05-06 DIAGNOSIS — M170 Bilateral primary osteoarthritis of knee: Secondary | ICD-10-CM | POA: Diagnosis present

## 2018-05-06 DIAGNOSIS — Z833 Family history of diabetes mellitus: Secondary | ICD-10-CM | POA: Diagnosis not present

## 2018-05-06 DIAGNOSIS — Z9842 Cataract extraction status, left eye: Secondary | ICD-10-CM | POA: Diagnosis not present

## 2018-05-06 DIAGNOSIS — Z961 Presence of intraocular lens: Secondary | ICD-10-CM | POA: Diagnosis present

## 2018-05-06 DIAGNOSIS — M216X2 Other acquired deformities of left foot: Secondary | ICD-10-CM | POA: Diagnosis not present

## 2018-05-06 DIAGNOSIS — Z8249 Family history of ischemic heart disease and other diseases of the circulatory system: Secondary | ICD-10-CM | POA: Diagnosis not present

## 2018-05-06 DIAGNOSIS — Z90710 Acquired absence of both cervix and uterus: Secondary | ICD-10-CM | POA: Diagnosis not present

## 2018-05-06 DIAGNOSIS — Z471 Aftercare following joint replacement surgery: Secondary | ICD-10-CM | POA: Diagnosis not present

## 2018-05-06 DIAGNOSIS — Z7984 Long term (current) use of oral hypoglycemic drugs: Secondary | ICD-10-CM | POA: Diagnosis not present

## 2018-05-06 DIAGNOSIS — Z6829 Body mass index (BMI) 29.0-29.9, adult: Secondary | ICD-10-CM | POA: Diagnosis not present

## 2018-05-06 DIAGNOSIS — Z9049 Acquired absence of other specified parts of digestive tract: Secondary | ICD-10-CM | POA: Diagnosis not present

## 2018-05-06 DIAGNOSIS — L659 Nonscarring hair loss, unspecified: Secondary | ICD-10-CM | POA: Diagnosis present

## 2018-05-06 DIAGNOSIS — I10 Essential (primary) hypertension: Secondary | ICD-10-CM | POA: Diagnosis not present

## 2018-05-06 DIAGNOSIS — I472 Ventricular tachycardia: Secondary | ICD-10-CM | POA: Diagnosis not present

## 2018-05-06 DIAGNOSIS — E785 Hyperlipidemia, unspecified: Secondary | ICD-10-CM | POA: Diagnosis not present

## 2018-05-06 DIAGNOSIS — Z888 Allergy status to other drugs, medicaments and biological substances status: Secondary | ICD-10-CM | POA: Diagnosis not present

## 2018-05-06 DIAGNOSIS — L209 Atopic dermatitis, unspecified: Secondary | ICD-10-CM | POA: Diagnosis present

## 2018-05-06 DIAGNOSIS — E119 Type 2 diabetes mellitus without complications: Secondary | ICD-10-CM | POA: Diagnosis not present

## 2018-05-06 DIAGNOSIS — M216X1 Other acquired deformities of right foot: Secondary | ICD-10-CM | POA: Diagnosis not present

## 2018-05-06 DIAGNOSIS — R262 Difficulty in walking, not elsewhere classified: Secondary | ICD-10-CM | POA: Diagnosis not present

## 2018-05-06 DIAGNOSIS — E039 Hypothyroidism, unspecified: Secondary | ICD-10-CM | POA: Diagnosis present

## 2018-05-06 DIAGNOSIS — R2681 Unsteadiness on feet: Secondary | ICD-10-CM | POA: Diagnosis not present

## 2018-05-06 DIAGNOSIS — M109 Gout, unspecified: Secondary | ICD-10-CM | POA: Diagnosis not present

## 2018-05-06 DIAGNOSIS — D62 Acute posthemorrhagic anemia: Secondary | ICD-10-CM | POA: Diagnosis not present

## 2018-05-06 DIAGNOSIS — R55 Syncope and collapse: Secondary | ICD-10-CM | POA: Diagnosis not present

## 2018-05-06 DIAGNOSIS — E669 Obesity, unspecified: Secondary | ICD-10-CM | POA: Diagnosis present

## 2018-05-06 DIAGNOSIS — Z8719 Personal history of other diseases of the digestive system: Secondary | ICD-10-CM | POA: Diagnosis not present

## 2018-05-06 DIAGNOSIS — Z7951 Long term (current) use of inhaled steroids: Secondary | ICD-10-CM | POA: Diagnosis not present

## 2018-05-06 DIAGNOSIS — I469 Cardiac arrest, cause unspecified: Secondary | ICD-10-CM | POA: Diagnosis not present

## 2018-05-06 DIAGNOSIS — R279 Unspecified lack of coordination: Secondary | ICD-10-CM | POA: Diagnosis not present

## 2018-05-06 DIAGNOSIS — Z743 Need for continuous supervision: Secondary | ICD-10-CM | POA: Diagnosis not present

## 2018-05-06 DIAGNOSIS — K219 Gastro-esophageal reflux disease without esophagitis: Secondary | ICD-10-CM | POA: Diagnosis present

## 2018-05-06 DIAGNOSIS — Z96651 Presence of right artificial knee joint: Secondary | ICD-10-CM | POA: Diagnosis not present

## 2018-05-06 DIAGNOSIS — J45909 Unspecified asthma, uncomplicated: Secondary | ICD-10-CM | POA: Diagnosis not present

## 2018-05-06 DIAGNOSIS — M1712 Unilateral primary osteoarthritis, left knee: Secondary | ICD-10-CM | POA: Diagnosis not present

## 2018-05-06 DIAGNOSIS — M6281 Muscle weakness (generalized): Secondary | ICD-10-CM | POA: Diagnosis not present

## 2018-05-06 LAB — CBC
HEMATOCRIT: 30.4 % — AB (ref 36.0–46.0)
Hemoglobin: 9.5 g/dL — ABNORMAL LOW (ref 12.0–15.0)
MCH: 26.5 pg (ref 26.0–34.0)
MCHC: 31.3 g/dL (ref 30.0–36.0)
MCV: 84.9 fL (ref 80.0–100.0)
Platelets: 209 10*3/uL (ref 150–400)
RBC: 3.58 MIL/uL — ABNORMAL LOW (ref 3.87–5.11)
RDW: 14.8 % (ref 11.5–15.5)
WBC: 11.6 10*3/uL — ABNORMAL HIGH (ref 4.0–10.5)
nRBC: 0 % (ref 0.0–0.2)

## 2018-05-06 LAB — GLUCOSE, CAPILLARY
Glucose-Capillary: 138 mg/dL — ABNORMAL HIGH (ref 70–99)
Glucose-Capillary: 91 mg/dL (ref 70–99)
Glucose-Capillary: 108 mg/dL — ABNORMAL HIGH (ref 70–99)
Glucose-Capillary: 118 mg/dL — ABNORMAL HIGH (ref 70–99)

## 2018-05-06 LAB — BASIC METABOLIC PANEL
Anion gap: 8 (ref 5–15)
BUN: 19 mg/dL (ref 8–23)
CALCIUM: 9 mg/dL (ref 8.9–10.3)
CO2: 23 mmol/L (ref 22–32)
CREATININE: 0.94 mg/dL (ref 0.44–1.00)
Chloride: 110 mmol/L (ref 98–111)
GFR calc Af Amer: >60 mL/min (ref >60)
GFR calc non Af Amer: >60 mL/min (ref >60)
GLUCOSE: 142 mg/dL — AB (ref 70–99)
Potassium: 3.6 mmol/L (ref 3.5–5.1)
Sodium: 141 mmol/L (ref 135–145)

## 2018-05-06 NOTE — Progress Notes (Signed)
Physical Therapy Treatment Patient Details Name: Brooke [name] MRN: [id number] DOB: Nov 09, 1950 Today's Date: 05/06/2018    History of Present Illness 67 YO female s/p R TKR on 05/05/18. PMH includes depression/anxiety, vertigo, LBP, pronation deformity of bilat LEs, vtach 2015 brought on by PVCs, syncope, diastolic dysfunction, DMII, HTN, HLD, atherosclerosis, renal artery stenosis and stenting 2005-2006, lap chole.     PT Comments    Patient reports ongoing dizziness and unable to ambulate to BR, required BSC. Patient will benefit from SNF for rehab to be more independent Prior to return home.  Patient did perform exercises and tolerated /bone foam.   Follow Up Recommendations  SNF     Equipment Recommendations  None recommended by PT    Recommendations for Other Services       Precautions / Restrictions Precautions Precautions: Fall;Knee Restrictions Other Position/Activity Restrictions: WBAT     Mobility  Bed Mobility               General bed mobility comments: declined OOB  Transfers                    Ambulation/Gait                 Stairs             Wheelchair Mobility    Modified Rankin (Stroke Patients Only)       Balance                                            Cognition Arousal/Alertness: Awake/alert Behavior During Therapy: WFL for tasks assessed/performed Overall Cognitive Status: Within Functional Limits for tasks assessed                                        Exercises Total Joint Exercises Ankle Circles/Pumps: AROM;Both;5 reps;Seated Quad Sets: Both;10 reps;Supine Heel Slides: AAROM;Right;5 reps;Supine;AROM;Left;10 reps Hip ABduction/ADduction: AAROM;Right;10 reps;Supine Straight Leg Raises: AAROM;AROM;Right;Left;10 reps;5 reps    General Comments        Pertinent Vitals/Pain Pain Score: 5  Pain Location: right knee Pain Descriptors / Indicators:  Aching;Sore Pain Intervention(s): Monitored during session;Premedicated before session;Ice applied    Home Living Family/patient expects to be discharged to:: Private residence             Home Equipment: Gilford Rile - 2 wheels;Cane - single point;Tub bench Additional Comments: spouse requires total care, will have caregivers available for patient and spouse.    Prior Function Level of Independence: Independent with assistive device(s)          PT Goals (current goals can now be found in the care plan section) Acute Rehab PT Goals Patient Stated Goal: go to rehab  Progress towards PT goals: Progressing toward goals    Frequency    7X/week      PT Plan Discharge plan needs to be updated    Co-evaluation              AM-PAC PT "6 Clicks" Daily Activity  Outcome Measure  Difficulty turning over in bed (including adjusting bedclothes, sheets and blankets)?: A Lot Difficulty moving from lying on back to sitting on the side of the bed? : A Lot Difficulty sitting down on and standing up from a chair with arms (  e.g., wheelchair, bedside commode, etc,.)?: A Lot Help needed moving to and from a bed to chair (including a wheelchair)?: A Lot Help needed walking in hospital room?: A Lot Help needed climbing 3-5 steps with a railing? : Total 6 Click Score: 11    End of Session Equipment Utilized During Treatment: Gait belt Activity Tolerance: Patient limited by fatigue;Patient limited by pain Patient left: in bed;with call bell/phone within reach;with bed alarm set Nurse Communication: Mobility status PT Visit Diagnosis: Unsteadiness on feet (R26.81)     Time: 6148-3073 PT Time Calculation (min) (ACUTE ONLY): 13 min  Charges:   $Therapeutic Exercise: 8-22 mins                      Tresa Endo PT Acute Rehabilitation Services Pager [phone] Office [phone]    Claretha Cooper 05/06/2018, 3:08 PM

## 2018-05-06 NOTE — Care Management Note (Signed)
Case Management Note  Patient Details  Name: Brooke [name] MRN: [id number] Date of Birth: 1950-10-10  Subjective/Objective:     Plan for d/c to SNF, discharge planning per CSW. [phone]               Action/Plan:   Expected Discharge Date:                  Expected Discharge Plan:  [place]  In-House Referral:  Clinical Social Work  Discharge planning Services  CM Consult  Post Acute Care Choice:  NA Choice offered to:  Patient  DME Arranged:  N/A DME Agency:  NA  HH Arranged:  NA HH Agency:  NA  Status of Service:  Completed, signed off  If discussed at [place] of Stay Meetings, dates discussed:    Additional Comments:  Guadalupe Maple, RN 05/06/2018, 10:55 AM

## 2018-05-06 NOTE — Progress Notes (Signed)
Occupational Therapy Evaluation Patient Details Name: Brooke [name] MRN: [id number] DOB: [birth year] Today's Date: 05/06/2018    History of Present Illness 67 YO female s/p R TKR on 05/05/18. PMH includes depression/anxiety, vertigo, LBP, pronation deformity of bilat LEs, vtach 2015 brought on by PVCs, syncope, diastolic dysfunction, DMII, HTN, HLD, atherosclerosis, renal artery stenosis and stenting 2005-2006, lap chole.    Clinical Impression   Pt was admitted for the above sx.  At baseline, she is mod I with adls and is the caregiver for her husband; he requires total A except for self feeding. Pt will benefit from continued OT to increase safety and independence with adls.  Evaluation was limited by pain.  Goals in acute setting are for supervision level    Follow Up Recommendations  SNF;Supervision/Assistance - 24 hour    Equipment Recommendations  3 in 1 bedside commode    Recommendations for Other Services       Precautions / Restrictions Precautions Precautions: Fall;Knee Restrictions Other Position/Activity Restrictions: WBAT       Mobility Bed Mobility               General bed mobility comments: oob  Transfers   Equipment used: Rolling walker (2 wheeled) Transfers: Sit to/from Stand Sit to Stand: Min assist         General transfer comment: cues for hand placement, patient required no assistance from recliner and BSC over toilet    Balance                                           ADL either performed or assessed with clinical judgement   ADL Overall ADL's : Needs assistance/impaired Eating/Feeding: Independent   Grooming: Set up;Sitting   Upper Body Bathing: Set up;Sitting   Lower Body Bathing: Moderate assistance;Sit to/from stand   Upper Body Dressing : Set up;Sitting   Lower Body Dressing: Maximal assistance;Sit to/from stand                 General ADL Comments: pt with increased pain. Assessed for adls and  educated on AE. Pt used sock aide with LLE and simulated reacher. Pt is very independent natured. She is the caregiver for her husband     Vision         Perception     Praxis      Pertinent Vitals/Pain Pain Score: 8  Pain Location: right knee Pain Descriptors / Indicators: Aching;Sore Pain Intervention(s): Limited activity within patient's tolerance;Monitored during session;Premedicated before session;Repositioned;Ice applied     Hand Dominance     Extremity/Trunk Assessment Upper Extremity Assessment Upper Extremity Assessment: Overall WFL for tasks assessed           Communication Communication Communication: No difficulties   Cognition Arousal/Alertness: Awake/alert Behavior During Therapy: WFL for tasks assessed/performed Overall Cognitive Status: Within Functional Limits for tasks assessed                                     General Comments       Exercises Total Joint Exercises Quad Sets: Both;10 reps;Supine Short Arc Quad: AAROM;Left;Right;10 reps;Supine Heel Slides: AAROM;Right;5 reps;Supine;AROM;Left;10 reps Hip ABduction/ADduction: AAROM;Right;10 reps;Supine Straight Leg Raises: AAROM;AROM;Right;Left;10 reps;5 reps Goniometric ROM: right knee flex 10-50   Shoulder Instructions      Home Living Family/patient  expects to be discharged to:: Private residence                   Bathroom Shower/Tub: Tub/shower unit   Bathroom Toilet: [place]: Environmental consultant - 2 wheels;Cane - single point;Tub bench   Additional Comments: spouse requires total care, will have caregivers available for patient and spouse.      Prior Functioning/Environment Level of Independence: Independent with assistive device(s)                 OT Problem List:        OT Treatment/Interventions: Self-care/ADL training;DME and/or AE instruction;Patient/family education;Balance training;Therapeutic activities    OT Goals(Current goals  can be found in the care plan section) Acute Rehab OT Goals Patient Stated Goal: go to rehab  OT Goal Formulation: With patient Time For Goal Achievement: 05/20/18 Potential to Achieve Goals: Good ADL Goals Pt Will Perform Grooming: with supervision;standing Pt Will Perform Lower Body Bathing: with supervision;with adaptive equipment;sit to/from stand Pt Will Perform Lower Body Dressing: with supervision;sit to/from stand;with adaptive equipment Pt Will Transfer to Toilet: with supervision;ambulating;bedside commode Pt Will Perform Toileting - Clothing Manipulation and hygiene: with supervision;sit to/from stand Additional ADL Goal #1: pt will perform bed mobility at supervision level in preparation for adls  OT Frequency: Min 2X/week   Barriers to D/C:            Co-evaluation              AM-PAC PT "6 Clicks" Daily Activity     Outcome Measure Help from another person eating meals?: None Help from another person taking care of personal grooming?: A Little Help from another person toileting, which includes using toliet, bedpan, or urinal?: A Little Help from another person bathing (including washing, rinsing, drying)?: A Lot Help from another person to put on and taking off regular upper body clothing?: A Little Help from another person to put on and taking off regular lower body clothing?: A Lot 6 Click Score: 17   End of Session    Activity Tolerance: Patient limited by pain Patient left: in chair;with call bell/phone within reach  OT Visit Diagnosis: Pain Pain - Right/Left: Right Pain - part of body: Knee                Time: 1203-1223 OT Time Calculation (min): 20 min Charges:  OT General Charges $OT Visit: 1 Visit OT Evaluation $OT Eval Low Complexity: [place], OTR/L Acute Rehabilitation Services [phone] [facility] pager [phone] office 05/06/2018  [place] 05/06/2018, 1:24 PM

## 2018-05-06 NOTE — Progress Notes (Addendum)
PATIENT ID: Brooke [name]  MRN: [id number]  DOB/AGE:  [birth year] / 67 y.o.  1 Day Post-Op Procedure(s) (LRB): RIGHT TOTAL KNEE ARTHROPLASTY (Right)    PROGRESS NOTE Subjective: Patient is alert, oriented, no Nausea, no Vomiting, yes passing gas. Taking PO well. Denies SOB, Chest or Calf Pain. Using Incentive Spirometer, PAS in place. Ambulate patient walked to 45' nursing station and back, Patient reports pain as 2/10.Physical therapy is actually recommending short-term skilled nursing home placement because her husband is wheelchair bound and there is minimal help at home. .    Objective: Vital signs in last 24 hours: Vitals:   05/05/18 2028 05/05/18 2113 05/06/18 0138 05/06/18 0543  BP:  (Abnormal) 143/74 (Abnormal) 143/69 (Abnormal) 161/89  Pulse:  62 63 63  Resp:   16   Temp:  98 F (36.7 C) (Abnormal) 97.5 F (36.4 C) 97.8 F (36.6 C)  TempSrc:  Oral Oral Oral  SpO2: 99% 100% 100% 100%  Weight:      Height:          Intake/Output from previous day: I/O last 3 completed shifts: In: 4553.2 [P.O.:942; I.V.:3411.2; IV Piggyback:200] Out: 3300 [Urine:3100; Blood:200]   Intake/Output this shift: No intake/output data recorded.   LABORATORY DATA: Recent Labs    05/05/18 1321 05/05/18 1653 05/05/18 2225 05/06/18 0517  WBC  --   --   --  11.6*  HGB  --   --   --  9.5*  HCT  --   --   --  30.4*  PLT  --   --   --  209  NA  --   --   --  141  K  --   --   --  3.6  CL  --   --   --  110  CO2  --   --   --  23  BUN  --   --   --  19  CREATININE  --   --   --  0.94  GLUCOSE  --   --   --  142*  GLUCAP 136* 187* 158*  --   CALCIUM  --   --   --  9.0    Examination: Neurologically intact ABD soft Neurovascular intact Sensation intact distally Intact pulses distally Dorsiflexion/Plantar flexion intact Incision: dressing C/D/I No cellulitis present Compartment soft}  Assessment:   1 Day Post-Op Procedure(s) (LRB): RIGHT TOTAL KNEE ARTHROPLASTY  (Right) ADDITIONAL DIAGNOSIS: Expected Acute Blood Loss Anemia, Gout, diabetes, hypertension, history of vertigo, Severe arthritis of contralateral leg with valgus deformity  Anticipated LOS equal to or greater than 2 midnights due to - Age 66 and older with one or more of the following:  - Obesity  - Expected need for hospital services (PT, OT, Nursing) required for safe  discharge  - Anticipated need for postoperative skilled nursing care or inpatient rehab  - Active co-morbidities: Diabetes,Her contralateral leg has severe valgus deformity and arthritis, which greatly limits her ambulation  - Patient is a high risk of re-admission due to: Barriers to post-acute care (logistical, no family support in home)  Patient's spouse is wheelchair-bound and she is the primary caregiver.  There is limited family support.   Plan: PT/OT WBAT, AROM and PROM  DVT Prophylaxis:  SCDx72hrs, ASA 81 mg BID x 2 weeks DISCHARGE PLAN: Home DISCHARGE NEEDS: HHPT, Walker and 3-in-1 comode seat     Kerin Salen 05/06/2018, 7:14 AM Patient ID: Brooke [name], female  DOB: [birth year], 67 y.o.   MRN: [id number]

## 2018-05-06 NOTE — [place] FL2 (Addendum)
[place] MEDICAID FL2 LEVEL OF CARE SCREENING TOOL     IDENTIFICATION  Patient Name: Brooke [name] Birthdate: 07-May-1951 Sex: female Admission Date (Current Location): 05/05/2018  [facility] and [place] Number:  Herbalist and Address:  [facility],  [place] [address] [place]      Provider Number: [id number]  Attending Physician Name and Address:  Frederik Pear, MD  Relative Name and Phone Number:       Current Level of Care: SNF Recommended Level of Care: [facility] Prior Approval Number:    Date Approved/Denied:   PASRR Number:    Discharge Plan: SNF    Current Diagnoses: Patient Active Problem List   Diagnosis Date Noted  . Primary osteoarthritis of right knee 05/05/2018  . Osteoarthritis of right knee 04/30/2018  . Degenerative arthritis of knee, bilateral 08/07/2017  . Depression with anxiety 07/19/2017  . Vertigo 01/12/2017  . Low back pain 01/12/2017  . SOB (shortness of breath) 12/02/2016  . Dysuria 09/12/2016  . Bilateral knee pain 02/15/2015  . Effusion of right knee 01/28/2015  . Asthma with exacerbation 11/09/2014  . Pronation deformity of both feet 09/29/2014  . Greater trochanteric bursitis of left hip 08/31/2014  . Degenerative arthritis of left knee 05/18/2014  . Ventricular tachycardia, polymorphic ([place]) 01/04/2014  . Abnormal MRI of head 12/04/2013  . Syncope 11/06/2013  . Chest pain 11/06/2013  . Headache(784.0) 11/06/2013  . Nausea with vomiting 11/06/2013  . Cough 08/28/2013  . Hypokalemia 08/18/2012  . Diastolic dysfunction 45/40/9811  . Leukocytosis 01/03/2011  . Toe pain 12/20/2010  . Preventative health care 12/19/2010  . FATIGUE 09/05/2009  . Diabetes ([place]) 11/29/2008  . Gout 08/09/2008  . ANEMIA-NOS 02/09/2008  . GERD 02/09/2008  . ALOPECIA 12/30/2007  . Hyperlipidemia 10/01/2007  . Essential hypertension 02/26/2007  . COLONIC POLYPS, HX OF 02/26/2007  . GOITER NOS 09/19/2006   . Asthma 09/19/2006  . ECZEMA, ATOPIC DERMATITIS 09/19/2006  . Insomnia 09/19/2006    Orientation RESPIRATION BLADDER Height & Weight     Self, Time, Situation, Place  Normal Continent Weight: 165 lb 5.5 oz (75 kg) Height:  5\' 3"  (160 cm)  BEHAVIORAL SYMPTOMS/MOOD NEUROLOGICAL BOWEL NUTRITION STATUS      Continent Diet(Carb Modified )  AMBULATORY STATUS COMMUNICATION OF NEEDS Skin   Extensive Assist Verbally Surgical wounds                       Personal Care Assistance Level of Assistance  Bathing, Feeding, Dressing Bathing Assistance: Limited assistance Feeding assistance: Independent Dressing Assistance: Maximum assistance     Functional Limitations Info  Sight, Hearing, Speech Sight Info: Adequate Hearing Info: Adequate Speech Info: Adequate    SPECIAL CARE FACTORS FREQUENCY  PT (By licensed PT), OT (By licensed OT)     PT Frequency: 7x/week OT Frequency: 7x/week            Contractures Contractures Info: Not present    Additional Factors Info  Code Status, Allergies Code Status Info: fullcode  Allergies Info: Ace Inhibitors, Codeine, Hydrocodone, Tramadol           Current Medications (05/06/2018):  This is the current hospital active medication list Current Facility-Administered Medications  Medication Dose Route Frequency Provider Last Rate Last Dose  . acetaminophen (TYLENOL) tablet 325-650 mg  325-650 mg Oral Q6H PRN Frederik Pear, MD   650 mg at 05/05/18 1200  . albuterol (PROVENTIL) (2.5 MG/3ML) 0.083% nebulizer solution 2.5  mg  2.5 mg Inhalation Q6H PRN Frederik Pear, MD      . allopurinol (ZYLOPRIM) tablet 100 mg  100 mg Oral q morning - 10a Frederik Pear, MD   100 mg at 05/06/18 1012  . alum & mag hydroxide-simeth (MAALOX/MYLANTA) 200-200-20 MG/5ML suspension 30 mL  30 mL Oral Q4H PRN Frederik Pear, MD      . amLODipine (NORVASC) tablet 10 mg  10 mg Oral q morning - 10a Frederik Pear, MD      . aspirin chewable tablet 81 mg  81 mg Oral BID  Frederik Pear, MD   81 mg at 05/06/18 1013  . bisacodyl (DULCOLAX) EC tablet 5 mg  5 mg Oral Daily PRN Frederik Pear, MD      . budesonide (PULMICORT) nebulizer solution 0.5 mg  0.5 mg Nebulization BID Frederik Pear, MD   0.5 mg at 05/06/18 0739  . celecoxib (CELEBREX) capsule 200 mg  200 mg Oral BID Frederik Pear, MD   200 mg at 05/06/18 1012  . citalopram (CELEXA) tablet 20 mg  20 mg Oral q morning - 10a Frederik Pear, MD   20 mg at 05/06/18 1012  . clonazePAM (KLONOPIN) tablet 0.5 mg  0.5 mg Oral BID PRN Frederik Pear, MD      . dextrose 5 % and 0.45 % NaCl with KCl 20 mEq/L infusion   Intravenous Continuous Frederik Pear, MD 100 mL/hr at 05/06/18 0600    . diphenhydrAMINE (BENADRYL) 12.5 MG/5ML elixir 12.5-25 mg  12.5-25 mg Oral Q4H PRN Frederik Pear, MD      . docusate sodium (COLACE) capsule 100 mg  100 mg Oral BID Frederik Pear, MD   100 mg at 05/06/18 1012  . gabapentin (NEURONTIN) capsule 300 mg  300 mg Oral TID Frederik Pear, MD   300 mg at 05/06/18 1012  . hydrochlorothiazide (HYDRODIURIL) tablet 25 mg  25 mg Oral q morning - 10a Frederik Pear, MD   25 mg at 05/05/18 1717  . HYDROmorphone (DILAUDID) injection 0.5-1 mg  0.5-1 mg Intravenous Q4H PRN Frederik Pear, MD      . insulin aspart (novoLOG) injection 0-15 Units  0-15 Units Subcutaneous TID WC Frederik Pear, MD   2 Units at 05/06/18 0820  . meclizine (ANTIVERT) tablet 25 mg  25 mg Oral TID PRN Frederik Pear, MD      . menthol-cetylpyridinium (CEPACOL) lozenge 3 mg  1 lozenge Oral PRN Frederik Pear, MD       Or  . phenol (CHLORASEPTIC) mouth spray 1 spray  1 spray Mouth/Throat PRN Frederik Pear, MD      . metFORMIN (GLUCOPHAGE-XR) 24 hr tablet 500 mg  500 mg Oral Q breakfast Frederik Pear, MD   500 mg at 05/06/18 0820  . methocarbamol (ROBAXIN) tablet 500 mg  500 mg Oral Q6H PRN Frederik Pear, MD   500 mg at 05/06/18 1012   Or  . methocarbamol (ROBAXIN) 500 mg in dextrose 5 % 50 mL IVPB  500 mg Intravenous Q6H PRN Frederik Pear, MD      .  metoCLOPramide (REGLAN) tablet 5-10 mg  5-10 mg Oral Q8H PRN Frederik Pear, MD       Or  . metoCLOPramide (REGLAN) injection 5-10 mg  5-10 mg Intravenous Q8H PRN Frederik Pear, MD      . ondansetron [facility]) tablet 4 mg  4 mg Oral Q6H PRN Frederik Pear, MD       Or  . ondansetron [facility]) injection 4 mg  4 mg  Intravenous Q6H PRN Frederik Pear, MD      . oxyCODONE (Oxy IR/ROXICODONE) immediate release tablet 5-10 mg  5-10 mg Oral Q4H PRN Frederik Pear, MD   10 mg at 05/06/18 1200  . pantoprazole (PROTONIX) EC tablet 40 mg  40 mg Oral Daily Frederik Pear, MD   40 mg at 05/06/18 1012  . polyethylene glycol (MIRALAX / GLYCOLAX) packet 17 g  17 g Oral Daily PRN Frederik Pear, MD      . potassium chloride SA (K-DUR,KLOR-CON) CR tablet 20 mEq  20 mEq Oral BID Frederik Pear, MD   20 mEq at 05/06/18 1012  . rosuvastatin (CRESTOR) tablet 20 mg  20 mg Oral q morning - 10a Frederik Pear, MD   20 mg at 05/06/18 1012  . sodium phosphate (FLEET) 7-19 GM/118ML enema 1 enema  1 enema Rectal Once PRN Frederik Pear, MD      . zolpidem Lorrin Mais) tablet 5 mg  5 mg Oral QHS PRN Frederik Pear, MD         Discharge Medications: Please see discharge summary for a list of discharge medications.  Relevant Imaging Results:  Relevant Lab Results:   Additional Information SKA:[id number]  Lia Hopping, LCSW

## 2018-05-06 NOTE — Clinical Social Work Note (Signed)
Clinical Social Work Assessment  Patient Details  Name: Brooke [name] MRN: [id number] Date of Birth: [birth year]  Date of referral:  05/06/18               Reason for consult:  Discharge Planning                Permission sought to share information with:  Facility Art therapist granted to share information::  Yes, Verbal Permission Granted  Name::       Kampa,David  Agency::  SNF   Relationship::  Spouse/ Adult Children   Contact Information:    [phone]  Housing/Transportation Living arrangements for the past 2 months:  [place] of Information:  Patient Patient Interpreter Needed:  None Criminal Activity/Legal Involvement Pertinent to Current Situation/Hospitalization:  No - Comment as needed Significant Relationships:  None Lives with:  Spouse Do you feel safe going back to the place where you live?  Yes Need for family participation in patient care:  Yes (Comment)  Care giving concerns:   Right Knee Osteoarthritis, procedure :right total knee arthroplasty  Patient will not have assistance at home. Physical therapy recommends SNF placement for short rehab.   Social Worker assessment / plan:  CSW met with patient at bedside, explain role and reason for visit. Patient reports she lives at home with her spouse who is wheelchair bound and unable to help provide care. Patient reports she does not want to overwork her son and daughter in law because they are taking care of her spouse as well. Patient reports would prefer to go to a facility for short rehab. The patient independent with bathing, dressing and ambulating. Patient is hopeful the therapy at the SNF will go well.  CSW explain SNF process and provided the patient with a list of bed offers.  CSW submitted for patient PASRR. PASRR is level II. (Physician will need to sign 30 day note, CSW left with nursing staff to inform the physician in the a.m) FL2 completed.  CSW explain the  insurance authorization process. Patient reports understanding.  Plan: SNF ([facility])  Employment status:  Retired Nurse, adult PT Recommendations:  [place] / Referral to community resources:  [place]  Patient/Family's Response to care:  Agreeable and Responding well to care.   Patient/Family's Understanding of and Emotional Response to Diagnosis, Current Treatment, and Prognosis:  Patient has a fairly good understanding of her diagnosis and follow up care plan. Patient son and daughter law were present during discussion and agreeable to SNF placement as well.   Emotional Assessment Appearance:  Appears stated age Attitude/Demeanor/Rapport:    Affect (typically observed):  Accepting Orientation:  Oriented to Self, Oriented to Place, Oriented to  Time, Oriented to Situation Alcohol / Substance use:  Not Applicable Psych involvement (Current and /or in the community):  No (Comment)  Discharge Needs  Concerns to be addressed:  Decision making concerns Readmission within the last 30 days:  No Current discharge risk:  Dependent with Mobility Barriers to Discharge:  Continued Medical Work up, Scientist, research (life sciences)), [place], [place] 05/06/2018, 3:37 PM

## 2018-05-06 NOTE — Progress Notes (Signed)
Physical Therapy Treatment Patient Details Name: Brooke [name] MRN: [id number] DOB: 1951/06/03 Today's Date: 05/06/2018    History of Present Illness 67 YO female s/p R TKR on 05/05/18. PMH includes depression/anxiety, vertigo, LBP, pronation deformity of bilat LEs, vtach 2015 brought on by PVCs, syncope, diastolic dysfunction, DMII, HTN, HLD, atherosclerosis, renal artery stenosis and stenting 2005-2006, lap chole.     PT Comments     The patient reports having enough caregivers at DC. Patient did complain of some dizziness. CNA in to take BP. Noted left BP lower than right. Continue PT  Follow Up Recommendations  Home health PT;Supervision/Assistance - 24 hour     Equipment Recommendations    BSC   Recommendations for Other Services  OT     Precautions / Restrictions Precautions Precautions: Fall;Knee Restrictions Other Position/Activity Restrictions: WBAT     Mobility  Bed Mobility               General bed mobility comments: in recliner  Transfers   Equipment used: Rolling walker (2 wheeled) Transfers: Sit to/from Stand Sit to Stand: Min assist         General transfer comment: cues for hand placement, patient required no assistance from recliner and BSC over toilet  Ambulation/Gait Ambulation/Gait assistance: +2 safety/equipment;Min assist;Min guard Gait Distance (Feet): 55 Feet Assistive device: Rolling walker (2 wheeled) Gait Pattern/deviations: Step-to pattern;Step-through pattern     General Gait Details: cues for right step length and position inside RW.    Stairs             Wheelchair Mobility    Modified Rankin (Stroke Patients Only)       Balance                                            Cognition Arousal/Alertness: Awake/alert                                            Exercises Total Joint Exercises Quad Sets: Both;10 reps;Supine Short Arc Quad: AAROM;Left;Right;10  reps;Supine Heel Slides: AAROM;Right;5 reps;Supine;AROM;Left;10 reps Hip ABduction/ADduction: AAROM;Right;10 reps;Supine Straight Leg Raises: AAROM;AROM;Right;Left;10 reps;5 reps Goniometric ROM: right knee flex 10-50    General Comments        Pertinent Vitals/Pain Pain Score: 4  Pain Location: right knee Pain Descriptors / Indicators: Discomfort Pain Intervention(s): Premedicated before session;Monitored during session;Patient requesting pain meds-RN notified    Home Living                 Additional Comments: spouse requires total care, will have caregivers available for patient and spouse.    Prior Function            PT Goals (current goals can now be found in the care plan section) Progress towards PT goals: Progressing toward goals    Frequency    7X/week      PT Plan Discharge plan needs to be updated    Co-evaluation              AM-PAC PT "6 Clicks" Daily Activity  Outcome Measure  Difficulty turning over in bed (including adjusting bedclothes, sheets and blankets)?: A Lot Difficulty moving from lying on back to sitting on the side of the bed? : A Lot Difficulty  sitting down on and standing up from a chair with arms (e.g., wheelchair, bedside commode, etc,.)?: A Lot Help needed moving to and from a bed to chair (including a wheelchair)?: A Lot Help needed walking in hospital room?: A Lot Help needed climbing 3-5 steps with a railing? : Total 6 Click Score: 11    End of Session Equipment Utilized During Treatment: Gait belt Activity Tolerance: Patient tolerated treatment well Patient left: in chair;with chair alarm set;with call bell/phone within reach;with SCD's reapplied Nurse Communication: Mobility status PT Visit Diagnosis: Unsteadiness on feet (R26.81)     Time: 1027-2536 PT Time Calculation (min) (ACUTE ONLY): 38 min  Charges:  $Gait Training: 8-22 mins $Therapeutic Exercise: 8-22 mins $Self Care/Home Management: [place] Pager [phone] Office [phone]    Claretha Cooper 05/06/2018, 11:43 AM

## 2018-05-07 LAB — CBC
HCT: 27 % — ABNORMAL LOW (ref 36.0–46.0)
HEMOGLOBIN: 8.4 g/dL — AB (ref 12.0–15.0)
MCH: 26.7 pg (ref 26.0–34.0)
MCHC: 31.1 g/dL (ref 30.0–36.0)
MCV: 85.7 fL (ref 80.0–100.0)
NRBC: 0 % (ref 0.0–0.2)
Platelets: 161 10*3/uL (ref 150–400)
RBC: 3.15 MIL/uL — AB (ref 3.87–5.11)
RDW: 15.3 % (ref 11.5–15.5)
WBC: 8.5 10*3/uL (ref 4.0–10.5)

## 2018-05-07 LAB — GLUCOSE, CAPILLARY
GLUCOSE-CAPILLARY: 109 mg/dL — AB (ref 70–99)
Glucose-Capillary: 94 mg/dL (ref 70–99)
Glucose-Capillary: 104 mg/dL — ABNORMAL HIGH (ref 70–99)
Glucose-Capillary: 166 mg/dL — ABNORMAL HIGH (ref 70–99)

## 2018-05-07 NOTE — Progress Notes (Signed)
PATIENT ID: Brooke [name]  MRN: [id number]  DOB/AGE:  66-22-1952 / 66 y.o.  2 Days Post-Op Procedure(s) (LRB): RIGHT TOTAL KNEE ARTHROPLASTY (Right)    PROGRESS NOTE Subjective: Patient is alert, oriented, no Nausea, no Vomiting, yes passing gas. Taking PO well. Denies SOB, Chest or Calf Pain. Using Incentive Spirometer, PAS in place. Ambulate WBAT with pt walking 55 ft with therapy, Patient reports pain as 8/10 .    Objective: Vital signs in last 24 hours: Vitals:   05/06/18 1337 05/06/18 1948 05/06/18 2207 05/07/18 0440  BP: 137/68  135/60 (!) 100/50  Pulse: 66  71 68  Resp: 16  18 16   Temp: 98.1 F (36.7 C)  98.7 F (37.1 C) 98.6 F (37 C)  TempSrc: Oral  Oral Oral  SpO2: 98% 92% 98% 96%  Weight:      Height:          Intake/Output from previous day: I/O last 3 completed shifts: In: 2485.5 [P.O.:1080; I.V.:1405.5] Out: 2750 [Urine:2750]   Intake/Output this shift: No intake/output data recorded.   LABORATORY DATA: Recent Labs    05/06/18 0517  05/06/18 1652 05/06/18 2207 05/07/18 0524 05/07/18 0740  WBC 11.6*  --   --   --  8.5  --   HGB 9.5*  --   --   --  8.4*  --   HCT 30.4*  --   --   --  27.0*  --   PLT 209  --   --   --  161  --   NA 141  --   --   --   --   --   K 3.6  --   --   --   --   --   CL 110  --   --   --   --   --   CO2 23  --   --   --   --   --   BUN 19  --   --   --   --   --   CREATININE 0.94  --   --   --   --   --   GLUCOSE 142*  --   --   --   --   --   GLUCAP  --    < > 108* 118*  --  94  CALCIUM 9.0  --   --   --   --   --    < > = values in this interval not displayed.    Examination: Neurologically intact Neurovascular intact Sensation intact distally Intact pulses distally Dorsiflexion/Plantar flexion intact Incision: dressing C/D/I No cellulitis present Compartment soft}  Assessment:   2 Days Post-Op Procedure(s) (LRB): RIGHT TOTAL KNEE ARTHROPLASTY (Right) ADDITIONAL DIAGNOSIS: Expected Acute Blood Loss Anemia,  Diabetes, Hypertension and Gout, history of vertigo, Severe arthritis of contralateral leg with valgus deformity Anticipated LOS equal to or greater than 2 midnights due to - Age 66 and older with one or more of the following:  - Obesity  - Expected need for hospital services (PT, OT, Nursing) required for safe  discharge  - Anticipated need for postoperative skilled nursing care or inpatient rehab  - Active co-morbidities: Diabetes and Her contralateral leg has severe valgus deformity and arthritis, which greatly limits her ambulation OR   - Patient is a high risk of re-admission due to: Barriers to post-acute care (logistical, no family support in home)   Patient's spouse is wheelchair-bound  and she is the primary caregiver.     Plan: PT/OT WBAT, AROM and PROM  DVT Prophylaxis:  SCDx72hrs, ASA 81 mg BID x 2 weeks DISCHARGE PLAN: Skilled Nursing Facility/Rehab DISCHARGE NEEDS: HHPT, Walker and 3-in-1 comode seat     Joanell Rising 05/07/2018, 7:53 AM

## 2018-05-07 NOTE — Discharge Summary (Signed)
Patient ID: Brooke [name] MRN: [id number] DOB/AGE: May 25, 1951 67 y.o.  Admit date: 05/05/2018 Discharge date: 05/07/2018  Admission Diagnoses:  Principal Problem:   Osteoarthritis of right knee Active Problems:   Primary osteoarthritis of right knee   Discharge Diagnoses:  Same  Past Medical History:  Diagnosis Date  . Alopecia   . Aortic atherosclerosis ([place])   . Asthma    FOLLOWED BY PCP  . Eczema   . GERD (gastroesophageal reflux disease)   . Gout    04-28-2018--- per pt stable , as been a while since last episode  . History of colon polyps   . History of syncope 2015  . Hyperlipidemia   . Hypertension   . Hypothyroidism   . OA (osteoarthritis) of knee    bilateral  . PVC's (premature ventricular contractions)   . Toxic multinodular goiter    03/ 2003  s/p RAI  . Type 2 diabetes mellitus ([place])    followed by pcp  . Ventricular tachycardia, polymorphic ([place]) 01/04/2014   primary cardiologist-- dr Tressia Miners turner (hx monitor 2015 showed couplet PVCs, as trigger)  . Wears glasses   . Wears partial dentures    upper    Surgeries: Procedure(s): RIGHT TOTAL KNEE ARTHROPLASTY on 05/05/2018   Consultants:   Discharged Condition: Improved  Hospital Course: Brooke [name] is an 67 y.o. female who was admitted 05/05/2018 for operative treatment ofOsteoarthritis of right knee. Patient has severe unremitting pain that affects sleep, daily activities, and work/hobbies. After pre-op clearance the patient was taken to the operating room on 05/05/2018 and underwent  Procedure(s): RIGHT TOTAL KNEE ARTHROPLASTY.    Patient was given perioperative antibiotics:  Anti-infectives (From admission, onward)   Start     Dose/Rate Route Frequency Ordered Stop   05/05/18 0700  ceFAZolin (ANCEF) IVPB 2g/100 mL premix     2 g 200 mL/hr over 30 Minutes Intravenous On call to O.R. 05/05/18 1324 05/05/18 0901       Patient was given sequential compression devices, early ambulation,  and chemoprophylaxis to prevent DVT.  Patient benefited maximally from hospital stay and there were no complications.    Recent vital signs:  Patient Vitals for the past 24 hrs:  BP Temp Temp src Pulse Resp SpO2  05/07/18 0440 (!) 100/50 98.6 F (37 C) Oral 68 16 96 %  05/06/18 2207 135/60 98.7 F (37.1 C) Oral 71 18 98 %  05/06/18 1948 - - - - - 92 %  05/06/18 1337 137/68 98.1 F (36.7 C) Oral 66 16 98 %  05/06/18 1002 123/61 - - 63 - 100 %  05/06/18 1000 96/64 (!) 97.5 F (36.4 C) Oral 64 16 100 %     Recent laboratory studies:  Recent Labs    05/06/18 0517 05/07/18 0524  WBC 11.6* 8.5  HGB 9.5* 8.4*  HCT 30.4* 27.0*  PLT 209 161  NA 141  --   K 3.6  --   CL 110  --   CO2 23  --   BUN 19  --   CREATININE 0.94  --   GLUCOSE 142*  --   CALCIUM 9.0  --      Discharge Medications:   Allergies as of 05/07/2018      Reactions   Ace Inhibitors Swelling      Codeine Nausea And Vomiting, Rash   Hydrocodone Nausea And Vomiting   Tramadol Nausea And Vomiting      Medication List    STOP taking  these medications   ibuprofen 800 MG tablet Commonly known as:  ADVIL,MOTRIN     TAKE these medications   albuterol 108 (90 Base) MCG/ACT inhaler Commonly known as:  PROVENTIL HFA;VENTOLIN HFA Inhale 2 puffs into the lungs every 6 (six) hours as needed for wheezing.   allopurinol 100 MG tablet Commonly known as:  ZYLOPRIM Take 1 tablet (100 mg total) by mouth daily. What changed:  when to take this   [place] Medication Name: Rolling walker   amLODipine 10 MG tablet Commonly known as:  NORVASC Take 1 tablet (10 mg total) by mouth daily. What changed:  when to take this   aspirin EC 81 MG tablet Take 1 tablet (81 mg total) by mouth 2 (two) times daily.   citalopram 20 MG tablet Commonly known as:  CELEXA Take 1 tablet (20 mg total) by mouth daily. What changed:  when to take this   clonazePAM 0.5 MG tablet Commonly known as:   KLONOPIN Take 1 tablet (0.5 mg total) by mouth 2 (two) times daily as needed (vertigo).   fluticasone 110 MCG/ACT inhaler Commonly known as:  FLOVENT HFA Inhale 2 puffs into the lungs 2 (two) times daily.   gabapentin 100 MG capsule Commonly known as:  NEURONTIN Take 1 capsule (100 mg total) by mouth at bedtime.   hydrochlorothiazide 25 MG tablet Commonly known as:  HYDRODIURIL Take 1 tablet (25 mg total) by mouth daily. What changed:  when to take this   KLOR-CON M20 20 MEQ tablet Generic drug:  potassium chloride SA TAKE 1 TABLET BY MOUTH TWICE A DAY What changed:  how much to take   meclizine 25 MG tablet Commonly known as:  ANTIVERT Take 1 tablet (25 mg total) by mouth 3 (three) times daily as needed for dizziness.   metFORMIN 500 MG 24 hr tablet Commonly known as:  GLUCOPHAGE-XR TAKE ONE TABLET BY MOUTH ONCE DAILY WITH  BREAKFAST What changed:    how much to take  how to take this  when to take this   metoprolol succinate 100 MG 24 hr tablet Commonly known as:  TOPROL-XL Take 1 tablet (100 mg total) by mouth 2 (two) times daily.   omeprazole 20 MG capsule Commonly known as:  PRILOSEC Take 1 capsule (20 mg total) by mouth 2 (two) times daily.   oxyCODONE-acetaminophen 5-325 MG tablet Commonly known as:  PERCOCET/ROXICET Take 1 tablet by mouth every 4 (four) hours as needed for severe pain.   rosuvastatin 20 MG tablet Commonly known as:  CRESTOR Take 1 tablet (20 mg total) by mouth daily. What changed:  when to take this   tiZANidine 2 MG tablet Commonly known as:  ZANAFLEX Take 1 tablet (2 mg total) by mouth every 6 (six) hours as needed.   zolpidem 10 MG tablet Commonly known as:  AMBIEN Take 1 tablet (10 mg total) by mouth at bedtime as needed for sleep.            Durable Medical Equipment  (From admission, onward)         Start     Ordered   05/05/18 1238  DME Walker rolling  Once    Question:  Patient needs a walker to treat with the  following condition  Answer:  Status post right knee replacement   05/05/18 1237   05/05/18 1238  DME 3 n 1  Once     05/05/18 1237  Discharge Care Instructions  (From admission, onward)         Start     Ordered   05/07/18 0000  Weight bearing as tolerated     05/07/18 0809          Diagnostic Studies: Dg Chest 2 View  Result Date: 04/28/2018 CLINICAL DATA:  Pre op total knee replacement, nonsmoker, no chest complaints, states htn EXAM: CHEST - 2 VIEW COMPARISON:  04/07/2015 FINDINGS: The heart size and mediastinal contours are within normal limits. Both lungs are clear. No pleural effusion or pneumothorax. The visualized skeletal structures are unremarkable. IMPRESSION: No active cardiopulmonary disease. Electronically Signed   By: Lajean Manes M.D.   On: 04/28/2018 15:38    Disposition: Discharge disposition: 03-Skilled Nursing Facility       Discharge Instructions    Call MD / Call 911   Complete by:  As directed    If you experience chest pain or shortness of breath, CALL 911 and be transported to the hospital emergency room.  If you develope a fever above 101 F, pus (white drainage) or increased drainage or redness at the wound, or calf pain, call your surgeon's office.   Constipation Prevention   Complete by:  As directed    Drink plenty of fluids.  Prune juice may be helpful.  You may use a stool softener, such as Colace (over the counter) 100 mg twice a day.  Use MiraLax (over the counter) for constipation as needed.   Diet - low sodium heart healthy   Complete by:  As directed    Driving restrictions   Complete by:  As directed    No driving for 2 weeks   Increase activity slowly as tolerated   Complete by:  As directed    Patient may shower   Complete by:  As directed    You may shower without a dressing once there is no drainage.  Do not wash over the wound.  If drainage remains, cover wound with plastic wrap and then shower.   Weight bearing  as tolerated   Complete by:  As directed        Contact information for follow-up providers    Frederik Pear, MD In 2 weeks.   Specialty:  Orthopedic Surgery Contact information: [place] [address] [phone]            Contact information for after-discharge care    Destination    [facility] .   Service:  Skilled Nursing Contact information: [address] [place] [phone]                   Signed: Joanell Rising 05/07/2018, 8:09 AM

## 2018-05-07 NOTE — Progress Notes (Signed)
D/C Barrier: Chief of Staff still pending. CSW will update the medical team when received.  Kathrin Greathouse, Marlinda Mike, MSW Clinical Social Worker  [phone] 05/07/2018  1:14 PM

## 2018-05-07 NOTE — Progress Notes (Signed)
Physical Therapy Treatment Patient Details Name: Brooke [name] MRN: [id number] DOB: 01-Jan-1951 Today's Date: 05/07/2018    History of Present Illness 67 YO female s/p R TKR on 05/05/18. PMH includes depression/anxiety, vertigo, LBP, pronation deformity of bilat LEs, vtach 2015 brought on by PVCs, syncope, diastolic dysfunction, DMII, HTN, HLD, atherosclerosis, renal artery stenosis and stenting 2005-2006, lap chole.     PT Comments    Pt just working with OT but agreeable to therex program this am.  Pt requests ambulation deferred to later.   Follow Up Recommendations  SNF     Equipment Recommendations  None recommended by PT    Recommendations for Other Services       Precautions / Restrictions Precautions Precautions: Fall;Knee Restrictions Weight Bearing Restrictions: No Other Position/Activity Restrictions: WBAT     Mobility  Bed Mobility Overal bed mobility: Needs Assistance Bed Mobility: Supine to Sit     Supine to sit: HOB elevated;Min assist     General bed mobility comments: Pt up in chair with OT  Transfers Overall transfer level: Needs assistance Equipment used: Rolling walker (2 wheeled) Transfers: Sit to/from [employer] Transfers Sit to Stand: Min assist            Ambulation/Gait                 Stairs             Wheelchair Mobility    Modified Rankin (Stroke Patients Only)       Balance                                            Cognition Arousal/Alertness: Awake/alert Behavior During Therapy: WFL for tasks assessed/performed Overall Cognitive Status: Within Functional Limits for tasks assessed                                        Exercises Total Joint Exercises Ankle Circles/Pumps: AROM;Both;20 reps;Supine Quad Sets: Both;Supine;15 reps;AROM Heel Slides: AAROM;Right;Supine;15 reps Straight Leg Raises: AAROM;Right;20 reps;Supine Goniometric ROM: AAROM R knee -10 -  55    General Comments        Pertinent Vitals/Pain Pain Assessment: 0-10 Pain Score: 5  Pain Location: right knee Pain Descriptors / Indicators: Aching;Sore Pain Intervention(s): Limited activity within patient's tolerance;Monitored during session;Premedicated before session;Ice applied    Home Living                      Prior Function            PT Goals (current goals can now be found in the care plan section) Acute Rehab PT Goals Patient Stated Goal: go to rehab  PT Goal Formulation: With patient Time For Goal Achievement: 05/12/18 Potential to Achieve Goals: Good Progress towards PT goals: Progressing toward goals    Frequency    7X/week      PT Plan Current plan remains appropriate    Co-evaluation              AM-PAC PT "6 Clicks" Daily Activity  Outcome Measure  Difficulty turning over in bed (including adjusting bedclothes, sheets and blankets)?: A Lot Difficulty moving from lying on back to sitting on the side of the bed? : A Lot Difficulty sitting down on and standing  up from a chair with arms (e.g., wheelchair, bedside commode, etc,.)?: A Lot Help needed moving to and from a bed to chair (including a wheelchair)?: A Lot Help needed walking in hospital room?: A Lot Help needed climbing 3-5 steps with a railing? : Total 6 Click Score: 11    End of Session         PT Visit Diagnosis: Unsteadiness on feet (R26.81)     Time: 5366-4403 PT Time Calculation (min) (ACUTE ONLY): 15 min  Charges:  $Therapeutic Exercise: 8-22 mins                     Debe Coder PT Acute Rehabilitation Services Pager [phone] Office [phone]     Brooke [name] 05/07/2018, 11:43 AM

## 2018-05-07 NOTE — Progress Notes (Signed)
Physical Therapy Treatment Patient Details Name: Brooke [name] MRN: [id number] DOB: Apr 13, 1951 Today's Date: 05/07/2018    History of Present Illness 67 YO female s/p R TKR on 05/05/18. PMH includes depression/anxiety, vertigo, LBP, pronation deformity of bilat LEs, vtach 2015 brought on by PVCs, syncope, diastolic dysfunction, DMII, HTN, HLD, atherosclerosis, renal artery stenosis and stenting 2005-2006, lap chole.     PT Comments    Pt progressing steadily with mobility and looking forward to advancing to rehab setting.   Follow Up Recommendations  SNF     Equipment Recommendations  None recommended by PT    Recommendations for Other Services       Precautions / Restrictions Precautions Precautions: Fall;Knee Restrictions Weight Bearing Restrictions: No Other Position/Activity Restrictions: WBAT     Mobility  Bed Mobility Overal bed mobility: Needs Assistance Bed Mobility: Sit to Supine     Supine to sit: HOB elevated;Min assist Sit to supine: Min assist   General bed mobility comments: cues for sequence and assist to manage R LE  Transfers Overall transfer level: Needs assistance Equipment used: Rolling walker (2 wheeled) Transfers: Sit to/from Stand Sit to Stand: Min assist         General transfer comment: cues for use of UEs to self assist.  Steady assist for balance on initial standing  Ambulation/Gait Ambulation/Gait assistance: Min assist;Min guard Gait Distance (Feet): 100 Feet Assistive device: Rolling walker (2 wheeled) Gait Pattern/deviations: Step-to pattern;Step-through pattern Gait velocity: decr    General Gait Details: cues for right step length and position inside RW.    Stairs             Wheelchair Mobility    Modified Rankin (Stroke Patients Only)       Balance Overall balance assessment: Mild deficits observed, not formally tested                                          Cognition  Arousal/Alertness: Awake/alert Behavior During Therapy: WFL for tasks assessed/performed Overall Cognitive Status: Within Functional Limits for tasks assessed                                        Exercises Total Joint Exercises Ankle Circles/Pumps: AROM;Both;20 reps;Supine Quad Sets: Both;Supine;15 reps;AROM Heel Slides: AAROM;Right;Supine;15 reps Straight Leg Raises: AAROM;Right;20 reps;Supine Goniometric ROM: AAROM R knee -10 - 55    General Comments        Pertinent Vitals/Pain Pain Assessment: 0-10 Pain Score: 4  Pain Location: right knee Pain Descriptors / Indicators: Aching;Sore Pain Intervention(s): Limited activity within patient's tolerance;Monitored during session;Premedicated before session;Ice applied    Home Living                      Prior Function            PT Goals (current goals can now be found in the care plan section) Acute Rehab PT Goals Patient Stated Goal: go to rehab  PT Goal Formulation: With patient Time For Goal Achievement: 05/12/18 Potential to Achieve Goals: Good Progress towards PT goals: Progressing toward goals    Frequency    7X/week      PT Plan Current plan remains appropriate    Co-evaluation  AM-PAC PT "6 Clicks" Daily Activity  Outcome Measure  Difficulty turning over in bed (including adjusting bedclothes, sheets and blankets)?: A Lot Difficulty moving from lying on back to sitting on the side of the bed? : A Lot Difficulty sitting down on and standing up from a chair with arms (e.g., wheelchair, bedside commode, etc,.)?: A Lot Help needed moving to and from a bed to chair (including a wheelchair)?: A Little Help needed walking in hospital room?: A Little Help needed climbing 3-5 steps with a railing? : A Lot 6 Click Score: 14    End of Session Equipment Utilized During Treatment: Gait belt Activity Tolerance: Patient limited by fatigue Patient left: in bed;with call  bell/phone within reach;with bed alarm set Nurse Communication: Mobility status PT Visit Diagnosis: Unsteadiness on feet (R26.81)     Time: 8280-0349 PT Time Calculation (min) (ACUTE ONLY): 15 min  Charges:  $Gait Training: 8-22 mins $Therapeutic Exercise: 8-22 mins                     [place] Pager [phone] Office [phone]    Blythe Veach 05/07/2018, 1:58 PM

## 2018-05-07 NOTE — Progress Notes (Signed)
Occupational Therapy Treatment Patient Details Name: Brooke [name] MRN: [id number] DOB: April 26, 1951 Today's Date: 05/07/2018    History of present illness 67 YO female s/p R TKR on 05/05/18. PMH includes depression/anxiety, vertigo, LBP, pronation deformity of bilat LEs, vtach 2015 brought on by PVCs, syncope, diastolic dysfunction, DMII, HTN, HLD, atherosclerosis, renal artery stenosis and stenting 2005-2006, lap chole.    OT comments  Plan is SNF  Follow Up Recommendations  SNF;Supervision/Assistance - 24 hour    Equipment Recommendations  None recommended by OT    Recommendations for Other Services      Precautions / Restrictions Precautions Precautions: Fall;Knee Restrictions Weight Bearing Restrictions: No       Mobility Bed Mobility Overal bed mobility: Needs Assistance Bed Mobility: Supine to Sit     Supine to sit: HOB elevated;Min assist        Transfers Overall transfer level: Needs assistance Equipment used: Rolling walker (2 wheeled) Transfers: Sit to/from [employer] Transfers Sit to Stand: Min assist              Balance Overall balance assessment: Mild deficits observed, not formally tested                                         ADL either performed or assessed with clinical judgement   ADL Overall ADL's : Needs assistance/impaired Eating/Feeding: Independent   Grooming: Supervision/safety;Standing                   Toilet Transfer: Minimal assistance;RW;BSC;Stand-pivot   Toileting- Clothing Manipulation and Hygiene: Minimal assistance;Sit to/from stand;Cueing for sequencing;Cueing for safety               Vision Patient Visual Report: No change from baseline            Cognition Arousal/Alertness: Awake/alert Behavior During Therapy: WFL for tasks assessed/performed Overall Cognitive Status: Within Functional Limits for tasks assessed                                                      Pertinent Vitals/ Pain       Pain Score: 3  Pain Location: right knee Pain Descriptors / Indicators: Aching;Sore Pain Intervention(s): Limited activity within patient's tolerance;Repositioned         Frequency  Min 2X/week        Progress Toward Goals  OT Goals(current goals can now be found in the care plan section)  Progress towards OT goals: Progressing toward goals     Plan Discharge plan remains appropriate    Co-evaluation                 AM-PAC PT "6 Clicks" Daily Activity     Outcome Measure   Help from another person eating meals?: None Help from another person taking care of personal grooming?: A Little Help from another person toileting, which includes using toliet, bedpan, or urinal?: A Little Help from another person bathing (including washing, rinsing, drying)?: A Lot Help from another person to put on and taking off regular upper body clothing?: A Little Help from another person to put on and taking off regular lower body clothing?: A Lot 6 Click Score: 17    End of Session Equipment  Utilized During Treatment: Gait belt;Rolling walker  OT Visit Diagnosis: Pain Pain - Right/Left: Right Pain - part of body: Knee   Activity Tolerance Patient tolerated treatment well   Patient Left in chair;with call bell/phone within reach   Nurse Communication Mobility status        Time: 1045-1100 OT Time Calculation (min): 15 min  Charges: OT General Charges $OT Visit: 1 Visit OT Treatments $Self Care/Home Management : 8-22 mins  Brooke [name], [place] [phone] Office- [phone]      Brooke [name], Brooke [name] 05/07/2018, 11:32 AM

## 2018-05-07 NOTE — Plan of Care (Signed)
Pt. Is stable. Pain management in progress, effective. Plan of care reviewed. Problem: Education: Goal: Individualized Educational Video(s) Outcome: Progressing   Problem: Activity: Goal: Ability to avoid complications of mobility impairment will improve Outcome: Progressing Goal: Range of joint motion will improve Outcome: Progressing   Problem: Clinical Measurements: Goal: Postoperative complications will be avoided or minimized Outcome: Progressing   Problem: Pain Management: Goal: Pain level will decrease with appropriate interventions Outcome: Progressing   Problem: Skin Integrity: Goal: Will show signs of wound healing Outcome: Progressing

## 2018-05-08 ENCOUNTER — Telehealth: Payer: Self-pay | Admitting: *Deleted

## 2018-05-08 DIAGNOSIS — Z471 Aftercare following joint replacement surgery: Secondary | ICD-10-CM | POA: Diagnosis not present

## 2018-05-08 DIAGNOSIS — D649 Anemia, unspecified: Secondary | ICD-10-CM | POA: Diagnosis not present

## 2018-05-08 DIAGNOSIS — M216X1 Other acquired deformities of right foot: Secondary | ICD-10-CM | POA: Diagnosis not present

## 2018-05-08 DIAGNOSIS — M109 Gout, unspecified: Secondary | ICD-10-CM | POA: Diagnosis not present

## 2018-05-08 DIAGNOSIS — Z96651 Presence of right artificial knee joint: Secondary | ICD-10-CM | POA: Diagnosis not present

## 2018-05-08 DIAGNOSIS — M216X2 Other acquired deformities of left foot: Secondary | ICD-10-CM | POA: Diagnosis not present

## 2018-05-08 DIAGNOSIS — R262 Difficulty in walking, not elsewhere classified: Secondary | ICD-10-CM | POA: Diagnosis not present

## 2018-05-08 DIAGNOSIS — I469 Cardiac arrest, cause unspecified: Secondary | ICD-10-CM | POA: Diagnosis not present

## 2018-05-08 DIAGNOSIS — R55 Syncope and collapse: Secondary | ICD-10-CM | POA: Diagnosis not present

## 2018-05-08 DIAGNOSIS — Z7984 Long term (current) use of oral hypoglycemic drugs: Secondary | ICD-10-CM | POA: Diagnosis not present

## 2018-05-08 DIAGNOSIS — M1712 Unilateral primary osteoarthritis, left knee: Secondary | ICD-10-CM | POA: Diagnosis not present

## 2018-05-08 DIAGNOSIS — E119 Type 2 diabetes mellitus without complications: Secondary | ICD-10-CM | POA: Diagnosis not present

## 2018-05-08 DIAGNOSIS — Z743 Need for continuous supervision: Secondary | ICD-10-CM | POA: Diagnosis not present

## 2018-05-08 DIAGNOSIS — I700 Atherosclerosis of aorta: Secondary | ICD-10-CM | POA: Diagnosis not present

## 2018-05-08 DIAGNOSIS — E785 Hyperlipidemia, unspecified: Secondary | ICD-10-CM | POA: Diagnosis not present

## 2018-05-08 DIAGNOSIS — R2681 Unsteadiness on feet: Secondary | ICD-10-CM | POA: Diagnosis not present

## 2018-05-08 DIAGNOSIS — I472 Ventricular tachycardia: Secondary | ICD-10-CM | POA: Diagnosis not present

## 2018-05-08 DIAGNOSIS — M1711 Unilateral primary osteoarthritis, right knee: Secondary | ICD-10-CM | POA: Diagnosis not present

## 2018-05-08 DIAGNOSIS — R279 Unspecified lack of coordination: Secondary | ICD-10-CM | POA: Diagnosis not present

## 2018-05-08 DIAGNOSIS — M6281 Muscle weakness (generalized): Secondary | ICD-10-CM | POA: Diagnosis not present

## 2018-05-08 DIAGNOSIS — I10 Essential (primary) hypertension: Secondary | ICD-10-CM | POA: Diagnosis not present

## 2018-05-08 DIAGNOSIS — J45909 Unspecified asthma, uncomplicated: Secondary | ICD-10-CM | POA: Diagnosis not present

## 2018-05-08 LAB — CBC
HEMATOCRIT: 25.9 % — AB (ref 36.0–46.0)
Hemoglobin: 7.9 g/dL — ABNORMAL LOW (ref 12.0–15.0)
MCH: 26.2 pg (ref 26.0–34.0)
MCHC: 30.5 g/dL (ref 30.0–36.0)
MCV: 85.8 fL (ref 80.0–100.0)
NRBC: 0 % (ref 0.0–0.2)
PLATELETS: 162 10*3/uL (ref 150–400)
RBC: 3.02 MIL/uL — ABNORMAL LOW (ref 3.87–5.11)
RDW: 15.4 % (ref 11.5–15.5)
WBC: 8.9 10*3/uL (ref 4.0–10.5)

## 2018-05-08 LAB — GLUCOSE, CAPILLARY: Glucose-Capillary: 104 mg/dL — ABNORMAL HIGH (ref 70–99)

## 2018-05-08 NOTE — Progress Notes (Signed)
Physical Therapy Treatment Patient Details Name: Brooke [name] MRN: [id number] DOB: May 02, 1951 Today's Date: 05/08/2018    History of Present Illness 67 YO female s/p R TKR on 05/05/18. PMH includes depression/anxiety, vertigo, LBP, pronation deformity of bilat LEs, vtach 2015 brought on by PVCs, syncope, diastolic dysfunction, DMII, HTN, HLD, atherosclerosis, renal artery stenosis and stenting 2005-2006, lap chole.     PT Comments    Pt in much better spirits this am and demonstrating good progress on R knee ROM and activity tolerance.  Pt looking forward to progressing to rehab setting.   Follow Up Recommendations  SNF     Equipment Recommendations  None recommended by PT    Recommendations for Other Services       Precautions / Restrictions Precautions Precautions: Fall;Knee Restrictions Weight Bearing Restrictions: No Other Position/Activity Restrictions: WBAT     Mobility  Bed Mobility Overal bed mobility: Needs Assistance Bed Mobility: Sit to Supine       Sit to supine: Min assist   General bed mobility comments: cues for sequence and assist to manage R LE  Transfers Overall transfer level: Needs assistance Equipment used: Rolling walker (2 wheeled) Transfers: Sit to/from Stand Sit to Stand: Min guard         General transfer comment: cues for use of UEs to self assist.  Steady assist for balance on initial standing  Ambulation/Gait Ambulation/Gait assistance: Min assist;Min guard Gait Distance (Feet): 123 Feet Assistive device: Rolling walker (2 wheeled) Gait Pattern/deviations: Step-to pattern;Step-through pattern Gait velocity: decr    General Gait Details: cuees for sequence, posture and position from [employer]             Wheelchair Mobility    Modified Rankin (Stroke Patients Only)       Balance Overall balance assessment: Mild deficits observed, not formally tested                                           Cognition Arousal/Alertness: Awake/alert Behavior During Therapy: WFL for tasks assessed/performed Overall Cognitive Status: Within Functional Limits for tasks assessed                                        Exercises Total Joint Exercises Ankle Circles/Pumps: AROM;Both;20 reps;Supine Quad Sets: Both;Supine;15 reps;AROM Heel Slides: AAROM;Right;Supine;20 reps Straight Leg Raises: AAROM;Right;20 reps;Supine Goniometric ROM: AAROM R knee -10 - 75    General Comments        Pertinent Vitals/Pain Pain Assessment: 0-10 Pain Score: 4  Pain Location: right knee Pain Descriptors / Indicators: Aching;Sore Pain Intervention(s): Limited activity within patient's tolerance;Monitored during session;Premedicated before session;Ice applied    Home Living                      Prior Function            PT Goals (current goals can now be found in the care plan section) Acute Rehab PT Goals Patient Stated Goal: go to rehab  PT Goal Formulation: With patient Time For Goal Achievement: 05/12/18 Potential to Achieve Goals: Good Progress towards PT goals: Progressing toward goals    Frequency    7X/week      PT Plan Current plan remains appropriate    Co-evaluation  AM-PAC PT "6 Clicks" Daily Activity  Outcome Measure  Difficulty turning over in bed (including adjusting bedclothes, sheets and blankets)?: A Lot Difficulty moving from lying on back to sitting on the side of the bed? : A Lot Difficulty sitting down on and standing up from a chair with arms (e.g., wheelchair, bedside commode, etc,.)?: A Lot Help needed moving to and from a bed to chair (including a wheelchair)?: A Little Help needed walking in hospital room?: A Little Help needed climbing 3-5 steps with a railing? : A Little 6 Click Score: 15    End of Session Equipment Utilized During Treatment: Gait belt Activity Tolerance: Patient tolerated treatment  well Patient left: in bed;with call bell/phone within reach;with bed alarm set Nurse Communication: Mobility status PT Visit Diagnosis: Unsteadiness on feet (R26.81)     Time: 2841-3244 PT Time Calculation (min) (ACUTE ONLY): 31 min  Charges:  $Gait Training: 8-22 mins $Therapeutic Exercise: 8-22 mins                     [place] Pager [phone] Office [phone]    Hiromi Knodel 05/08/2018, 11:53 AM

## 2018-05-08 NOTE — Progress Notes (Signed)
PATIENT ID: Brooke [name]  MRN: [id number]  DOB/AGE:  1950/08/31 / 67 y.o.  3 Days Post-Op Procedure(s) (LRB): RIGHT TOTAL KNEE ARTHROPLASTY (Right)    PROGRESS NOTE Subjective: Patient is alert, oriented, no Nausea, no Vomiting, yes passing gas. Taking PO well. Denies SOB, Chest or Calf Pain. Using Incentive Spirometer, PAS in place. Ambulate 100', Patient reports pain as 2/10 .    Objective: Vital signs in last 24 hours: Vitals:   05/07/18 2027 05/07/18 2143 05/08/18 0509 05/08/18 0814  BP:  (Abnormal) 156/68 (Abnormal) 144/60   Pulse:  87 76   Resp:  16 16   Temp:  98 F (36.7 C) 98.2 F (36.8 C)   TempSrc:  Oral Oral   SpO2: 97% 98% 96% 97%  Weight:      Height:          Intake/Output from previous day: I/O last 3 completed shifts: In: 960 [P.O.:960] Out: 1000 [Urine:1000]   Intake/Output this shift: No intake/output data recorded.   LABORATORY DATA: Recent Labs    05/06/18 0517  05/07/18 0524  05/07/18 1727 05/07/18 2225 05/08/18 0512 05/08/18 0727  WBC 11.6*  --  8.5  --   --   --  8.9  --   HGB 9.5*  --  8.4*  --   --   --  7.9*  --   HCT 30.4*  --  27.0*  --   --   --  25.9*  --   PLT 209  --  161  --   --   --  162  --   NA 141  --   --   --   --   --   --   --   K 3.6  --   --   --   --   --   --   --   CL 110  --   --   --   --   --   --   --   CO2 23  --   --   --   --   --   --   --   BUN 19  --   --   --   --   --   --   --   CREATININE 0.94  --   --   --   --   --   --   --   GLUCOSE 142*  --   --   --   --   --   --   --   GLUCAP  --    < >  --    < > 109* 166*  --  104*  CALCIUM 9.0  --   --   --   --   --   --   --    < > = values in this interval not displayed.    Examination: Neurologically intact ABD soft Neurovascular intact Sensation intact distally Intact pulses distally Dorsiflexion/Plantar flexion intact Incision: dressing C/D/I No cellulitis present Compartment soft}  Assessment:   3 Days Post-Op Procedure(s)  (LRB): RIGHT TOTAL KNEE ARTHROPLASTY (Right) ADDITIONAL DIAGNOSIS: Expected Acute Blood Loss Anemia, Diabetes and Hypertension Anticipated LOS equal to or greater than 2 midnights due to - Age 21 and older with one or more of the following:  - Obesity  - Expected need for hospital services (PT, OT, Nursing) required for safe  discharge  - Anticipated need for postoperative skilled  nursing care or inpatient rehab  - Active co-morbidities: Diabetes OR  - Patient is a high risk of re-admission due to: Barriers to post-acute care (logistical, no family support in home)    Plan: PT/OT WBAT, AROM and PROM  DVT Prophylaxis:  SCDx72hrs, ASA 81 mg BID x 2 weeks DISCHARGE PLAN: Skilled Nursing Facility/Rehab, today if available DISCHARGE NEEDS: HHPT, Walker and 3-in-1 comode seat     Kerin Salen 05/08/2018, 9:50 AM

## 2018-05-08 NOTE — Telephone Encounter (Signed)
Pt was on TCM report admitted 05/05/2018 for operative treatment of Osteoarthritis of right knee.  After pre-op clearance the patient was taken to the operating room on 05/05/2018 and underwent  Procedure(s): RIGHT TOTAL KNEE /ARTHROPLASTY. Pt was D/C 05/08/18 and will f/u w/Rowan, Pilar Plate, MD In 2 weeks.Marland KitchenJohny Chess

## 2018-05-08 NOTE — Clinical Social Work Placement (Signed)
D/C Summary sent.  Nurse call report to: [facility]  NOTE  Date:  05/08/2018  Patient Details  Name: Brooke [name] MRN: [id number] Date of Birth: 07/21/1951  Clinical Social Work is seeking post-discharge placement for this patient at the [place] level of care (*CSW will initial, date and re-position this form in  chart as items are completed):  Yes   Patient/family provided with [facility] Work Department's list of facilities offering this level of care within the geographic area requested by the patient (or if unable, by the patient's family).  Yes   Patient/family informed of their freedom to choose among providers that offer the needed level of care, that participate in Medicare, Medicaid or managed care program needed by the patient, have an available bed and are willing to accept the patient.  Yes   Patient/family informed of [facility]'s ownership interest in [facility] and [facility], as well as of the fact that they are under no obligation to receive care at these facilities.  PASRR submitted to EDS on 05/08/18     PASRR number received on 05/08/18     Existing PASRR number confirmed on       FL2 transmitted to all facilities in geographic area requested by pt/family on       FL2 transmitted to all facilities within larger geographic area on       Patient informed that his/her managed care company has contracts with or will negotiate with certain facilities, including the following:  [facility]     Yes   Patient/family informed of bed offers received.  Patient chooses bed at [facility] recommends and patient chooses bed at      Patient to be transferred to [facility] on 05/08/18.  Patient to be transferred to facility by [facility]      Patient family notified on 05/08/18 of transfer.  Name of family member notified:   Patient to notify her son      PHYSICIAN       Additional Comment:    _______________________________________________ Lia Hopping, LCSW 05/08/2018, 10:37 AM

## 2018-05-12 ENCOUNTER — Encounter: Payer: Self-pay | Admitting: Internal Medicine

## 2018-05-12 ENCOUNTER — Non-Acute Institutional Stay (SKILLED_NURSING_FACILITY): Payer: Medicare Other | Admitting: Internal Medicine

## 2018-05-12 DIAGNOSIS — M109 Gout, unspecified: Secondary | ICD-10-CM | POA: Diagnosis not present

## 2018-05-12 DIAGNOSIS — D649 Anemia, unspecified: Secondary | ICD-10-CM | POA: Diagnosis not present

## 2018-05-12 DIAGNOSIS — M1711 Unilateral primary osteoarthritis, right knee: Secondary | ICD-10-CM | POA: Diagnosis not present

## 2018-05-12 DIAGNOSIS — I10 Essential (primary) hypertension: Secondary | ICD-10-CM | POA: Diagnosis not present

## 2018-05-12 NOTE — Assessment & Plan Note (Addendum)
Post total knee arthroplasty 05/05/2018 hemoglobin 7.9/hematocrit 25.9; admission hemoglobin 9.5 Recheck CBC; initiate iron PO

## 2018-05-12 NOTE — Patient Instructions (Signed)
See assessment and plan under each diagnosis in the problem list and acutely for this visit 

## 2018-05-12 NOTE — Assessment & Plan Note (Addendum)
PT /OT @ [facility] F/U

## 2018-05-12 NOTE — Assessment & Plan Note (Addendum)
See plan 05/08/18

## 2018-05-12 NOTE — Progress Notes (Signed)
NURSING HOME LOCATION:  [facility] ROOM NUMBER:  [id number]  CODE STATUS:  Full Code  PCP:  Biagio Borg, MD  [place] [address]  This is a comprehensive admission note to [facility] performed on this date less than 30 days from date of admission. Included are preadmission medical/surgical history; reconciled medication list; family history; social history and comprehensive review of systems.  Corrections and additions to the records were documented. Comprehensive physical exam was also performed. Additionally a clinical summary was entered for each active diagnosis pertinent to this admission in the Problem List to enhance continuity of care.  HPI: Patient was hospitalized 10/14-10/16/2019 for surgical intervention for symptomatic osteoarthritis of the right knee with severe unremitting pain affecting sleep, daily activities, and work/hobbies.  Right total knee arthroplasty was completed 05/05/2018 by Dr. Frederik Pear.  Sequential compression devices, early ambulation, and DVT prophylaxis were implemented perioperatively.  Past medical and surgical history: Includes history of polymorphic ventricular tachycardia, diabetes, toxic multinodular goiter, hypothyroidism, essential hypertension, dyslipidemia, colon polyposis, gout, asthma and GERD. Other procedures and surgeries include renal artery stenting x2, laparoscopic cholecystectomy, and abdominal hysterectomy.  Social history: Nondrinker, never smoked.  Family history: reviewed.   Review of systems: The patient is bright and alert.  She is oriented x3.  She even knew the names of her medications, although she was not sure they were being administered here.  Specifically she states she is on metformin for diabetes.  She will check her glucoses approximately twice a week fasting.  The low has been 85 and the high 138.  She denies any hypoglycemia. She does have dyspepsia and is on omeprazole.  Despite the  anemia, she denies any bleeding dyscrasias. She does describe swelling of the right lower extremity despite wrapping.  She states that she has take pain medicine twice a day on average for postop pain.  Constitutional: No fever, significant weight change, fatigue  Eyes: No redness, discharge, pain, vision change ENT/mouth: No nasal congestion, purulent discharge, earache, change in hearing, sore throat  Cardiovascular: No chest pain, palpitations, paroxysmal nocturnal dyspnea, claudication, edema  Respiratory: No cough, sputum production, hemoptysis, DOE, significant snoring, apnea  Gastrointestinal: No dysphagia, abdominal pain, nausea /vomiting, rectal bleeding, melena, change in bowels Genitourinary: No dysuria, hematuria, pyuria, incontinence, nocturia Dermatologic: No rash, pruritus, change in appearance of skin Neurologic: No dizziness, headache, syncope, seizures, numbness, tingling Psychiatric: No significant anxiety, depression, insomnia, anorexia Endocrine: No change in hair/skin/nails, excessive thirst, excessive hunger, excessive urination  Hematologic/lymphatic: No significant bruising, lymphadenopathy, abnormal bleeding Allergy/immunology: No itchy/watery eyes, significant sneezing, urticaria, angioedema  Physical exam:  Pertinent or positive findings: She has scattered small polypoid benign facial neoplasms including one on the right upper lid.  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  She has an upper plate.  Grade 1 systolic murmur is present at the left sternal border with increased second heart sound.  The right lower extremity is wrapped.  Pedal pulses are decreased.  General appearance: Adequately nourished; no acute distress, increased work of breathing is present.   Lymphatic: No lymphadenopathy about the head, neck, axilla. Eyes: No conjunctival inflammation or lid edema is present. There is no scleral icterus. Ears:  External ear exam shows no significant lesions  or deformities.   Nose:  External nasal examination shows no deformity or inflammation. Nasal mucosa are pink and moist without lesions, exudates Oral exam: Lips and gums are healthy appearing.There is no oropharyngeal  erythema or exudate. Neck:  No thyromegaly, masses, tenderness noted.    Heart:  Normal rate and regular rhythm. S1 normal without gallop,click, rub.  Lungs: Chest clear to auscultation without wheezes, rhonchi, rales, rubs. Abdomen: Bowel sounds are normal.  Abdomen is soft and nontender with no organomegaly, hernias, masses. GU: Deferred  Extremities:  No cyanosis, clubbing Neurologic exam: Balance, Rhomberg, finger to nose testing could not be completed due to clinical state Skin: Warm & dry w/o tenting. No significant  rash.  See clinical summary under each active problem in the Problem List with associated updated therapeutic plan

## 2018-05-12 NOTE — Assessment & Plan Note (Addendum)
Blood pressure controlled on Spironolactone Check BMET off HCTZ and potassium supplement

## 2018-05-13 LAB — CBC AND DIFFERENTIAL
HEMATOCRIT: 25 — AB (ref 36–46)
HEMOGLOBIN: 8.4 — AB (ref 12.0–16.0)
Neutrophils Absolute: 4
PLATELETS: 309 (ref 150–399)
WBC: 7.3

## 2018-05-20 DIAGNOSIS — M1711 Unilateral primary osteoarthritis, right knee: Secondary | ICD-10-CM | POA: Diagnosis not present

## 2018-05-29 DIAGNOSIS — M216X2 Other acquired deformities of left foot: Secondary | ICD-10-CM | POA: Diagnosis not present

## 2018-05-29 DIAGNOSIS — Z7984 Long term (current) use of oral hypoglycemic drugs: Secondary | ICD-10-CM | POA: Diagnosis not present

## 2018-05-29 DIAGNOSIS — Z7982 Long term (current) use of aspirin: Secondary | ICD-10-CM | POA: Diagnosis not present

## 2018-05-29 DIAGNOSIS — Z9181 History of falling: Secondary | ICD-10-CM | POA: Diagnosis not present

## 2018-05-29 DIAGNOSIS — I700 Atherosclerosis of aorta: Secondary | ICD-10-CM

## 2018-05-29 DIAGNOSIS — Z86010 Personal history of colonic polyps: Secondary | ICD-10-CM | POA: Diagnosis not present

## 2018-05-29 DIAGNOSIS — J4520 Mild intermittent asthma, uncomplicated: Secondary | ICD-10-CM | POA: Diagnosis not present

## 2018-05-29 DIAGNOSIS — M109 Gout, unspecified: Secondary | ICD-10-CM

## 2018-05-29 DIAGNOSIS — M1712 Unilateral primary osteoarthritis, left knee: Secondary | ICD-10-CM | POA: Diagnosis not present

## 2018-05-29 DIAGNOSIS — Z96651 Presence of right artificial knee joint: Secondary | ICD-10-CM | POA: Diagnosis not present

## 2018-05-29 DIAGNOSIS — E785 Hyperlipidemia, unspecified: Secondary | ICD-10-CM | POA: Diagnosis not present

## 2018-05-29 DIAGNOSIS — K219 Gastro-esophageal reflux disease without esophagitis: Secondary | ICD-10-CM | POA: Diagnosis not present

## 2018-05-29 DIAGNOSIS — Z471 Aftercare following joint replacement surgery: Secondary | ICD-10-CM | POA: Diagnosis not present

## 2018-05-29 DIAGNOSIS — E119 Type 2 diabetes mellitus without complications: Secondary | ICD-10-CM | POA: Diagnosis not present

## 2018-05-29 DIAGNOSIS — I10 Essential (primary) hypertension: Secondary | ICD-10-CM | POA: Diagnosis not present

## 2018-05-29 DIAGNOSIS — I472 Ventricular tachycardia: Secondary | ICD-10-CM

## 2018-06-03 DIAGNOSIS — E785 Hyperlipidemia, unspecified: Secondary | ICD-10-CM | POA: Diagnosis not present

## 2018-06-03 DIAGNOSIS — I10 Essential (primary) hypertension: Secondary | ICD-10-CM | POA: Diagnosis not present

## 2018-06-03 DIAGNOSIS — Z471 Aftercare following joint replacement surgery: Secondary | ICD-10-CM | POA: Diagnosis not present

## 2018-06-03 DIAGNOSIS — M216X2 Other acquired deformities of left foot: Secondary | ICD-10-CM | POA: Diagnosis not present

## 2018-06-03 DIAGNOSIS — M1712 Unilateral primary osteoarthritis, left knee: Secondary | ICD-10-CM | POA: Diagnosis not present

## 2018-06-03 DIAGNOSIS — E119 Type 2 diabetes mellitus without complications: Secondary | ICD-10-CM | POA: Diagnosis not present

## 2018-06-03 DIAGNOSIS — Z86010 Personal history of colonic polyps: Secondary | ICD-10-CM | POA: Diagnosis not present

## 2018-06-03 DIAGNOSIS — Z96651 Presence of right artificial knee joint: Secondary | ICD-10-CM | POA: Diagnosis not present

## 2018-06-03 DIAGNOSIS — K219 Gastro-esophageal reflux disease without esophagitis: Secondary | ICD-10-CM | POA: Diagnosis not present

## 2018-06-03 DIAGNOSIS — I472 Ventricular tachycardia: Secondary | ICD-10-CM | POA: Diagnosis not present

## 2018-06-03 DIAGNOSIS — Z7984 Long term (current) use of oral hypoglycemic drugs: Secondary | ICD-10-CM | POA: Diagnosis not present

## 2018-06-03 DIAGNOSIS — I700 Atherosclerosis of aorta: Secondary | ICD-10-CM | POA: Diagnosis not present

## 2018-06-03 DIAGNOSIS — Z7982 Long term (current) use of aspirin: Secondary | ICD-10-CM | POA: Diagnosis not present

## 2018-06-03 DIAGNOSIS — Z9181 History of falling: Secondary | ICD-10-CM | POA: Diagnosis not present

## 2018-06-03 DIAGNOSIS — M109 Gout, unspecified: Secondary | ICD-10-CM | POA: Diagnosis not present

## 2018-06-03 DIAGNOSIS — J4520 Mild intermittent asthma, uncomplicated: Secondary | ICD-10-CM | POA: Diagnosis not present

## 2018-06-05 ENCOUNTER — Inpatient Hospital Stay: Payer: Medicare Other | Admitting: Internal Medicine

## 2018-06-05 DIAGNOSIS — J4520 Mild intermittent asthma, uncomplicated: Secondary | ICD-10-CM | POA: Diagnosis not present

## 2018-06-05 DIAGNOSIS — I700 Atherosclerosis of aorta: Secondary | ICD-10-CM | POA: Diagnosis not present

## 2018-06-05 DIAGNOSIS — I472 Ventricular tachycardia: Secondary | ICD-10-CM | POA: Diagnosis not present

## 2018-06-05 DIAGNOSIS — M109 Gout, unspecified: Secondary | ICD-10-CM | POA: Diagnosis not present

## 2018-06-05 DIAGNOSIS — Z9181 History of falling: Secondary | ICD-10-CM | POA: Diagnosis not present

## 2018-06-05 DIAGNOSIS — Z7984 Long term (current) use of oral hypoglycemic drugs: Secondary | ICD-10-CM | POA: Diagnosis not present

## 2018-06-05 DIAGNOSIS — M216X2 Other acquired deformities of left foot: Secondary | ICD-10-CM | POA: Diagnosis not present

## 2018-06-05 DIAGNOSIS — Z7982 Long term (current) use of aspirin: Secondary | ICD-10-CM | POA: Diagnosis not present

## 2018-06-05 DIAGNOSIS — Z86010 Personal history of colonic polyps: Secondary | ICD-10-CM | POA: Diagnosis not present

## 2018-06-05 DIAGNOSIS — I10 Essential (primary) hypertension: Secondary | ICD-10-CM | POA: Diagnosis not present

## 2018-06-05 DIAGNOSIS — E119 Type 2 diabetes mellitus without complications: Secondary | ICD-10-CM | POA: Diagnosis not present

## 2018-06-05 DIAGNOSIS — M1712 Unilateral primary osteoarthritis, left knee: Secondary | ICD-10-CM | POA: Diagnosis not present

## 2018-06-05 DIAGNOSIS — E785 Hyperlipidemia, unspecified: Secondary | ICD-10-CM | POA: Diagnosis not present

## 2018-06-05 DIAGNOSIS — Z96651 Presence of right artificial knee joint: Secondary | ICD-10-CM | POA: Diagnosis not present

## 2018-06-05 DIAGNOSIS — Z471 Aftercare following joint replacement surgery: Secondary | ICD-10-CM | POA: Diagnosis not present

## 2018-06-05 DIAGNOSIS — K219 Gastro-esophageal reflux disease without esophagitis: Secondary | ICD-10-CM | POA: Diagnosis not present

## 2018-06-06 DIAGNOSIS — J4520 Mild intermittent asthma, uncomplicated: Secondary | ICD-10-CM | POA: Diagnosis not present

## 2018-06-06 DIAGNOSIS — I472 Ventricular tachycardia: Secondary | ICD-10-CM | POA: Diagnosis not present

## 2018-06-06 DIAGNOSIS — E785 Hyperlipidemia, unspecified: Secondary | ICD-10-CM | POA: Diagnosis not present

## 2018-06-06 DIAGNOSIS — K219 Gastro-esophageal reflux disease without esophagitis: Secondary | ICD-10-CM | POA: Diagnosis not present

## 2018-06-06 DIAGNOSIS — Z9181 History of falling: Secondary | ICD-10-CM | POA: Diagnosis not present

## 2018-06-06 DIAGNOSIS — E119 Type 2 diabetes mellitus without complications: Secondary | ICD-10-CM | POA: Diagnosis not present

## 2018-06-06 DIAGNOSIS — M109 Gout, unspecified: Secondary | ICD-10-CM | POA: Diagnosis not present

## 2018-06-06 DIAGNOSIS — Z7984 Long term (current) use of oral hypoglycemic drugs: Secondary | ICD-10-CM | POA: Diagnosis not present

## 2018-06-06 DIAGNOSIS — I700 Atherosclerosis of aorta: Secondary | ICD-10-CM | POA: Diagnosis not present

## 2018-06-06 DIAGNOSIS — M1712 Unilateral primary osteoarthritis, left knee: Secondary | ICD-10-CM | POA: Diagnosis not present

## 2018-06-06 DIAGNOSIS — I10 Essential (primary) hypertension: Secondary | ICD-10-CM | POA: Diagnosis not present

## 2018-06-06 DIAGNOSIS — Z86010 Personal history of colonic polyps: Secondary | ICD-10-CM | POA: Diagnosis not present

## 2018-06-06 DIAGNOSIS — M216X2 Other acquired deformities of left foot: Secondary | ICD-10-CM | POA: Diagnosis not present

## 2018-06-06 DIAGNOSIS — Z7982 Long term (current) use of aspirin: Secondary | ICD-10-CM | POA: Diagnosis not present

## 2018-06-06 DIAGNOSIS — Z471 Aftercare following joint replacement surgery: Secondary | ICD-10-CM | POA: Diagnosis not present

## 2018-06-06 DIAGNOSIS — Z96651 Presence of right artificial knee joint: Secondary | ICD-10-CM | POA: Diagnosis not present

## 2018-06-09 DIAGNOSIS — K219 Gastro-esophageal reflux disease without esophagitis: Secondary | ICD-10-CM | POA: Diagnosis not present

## 2018-06-09 DIAGNOSIS — M1712 Unilateral primary osteoarthritis, left knee: Secondary | ICD-10-CM | POA: Diagnosis not present

## 2018-06-09 DIAGNOSIS — M109 Gout, unspecified: Secondary | ICD-10-CM | POA: Diagnosis not present

## 2018-06-09 DIAGNOSIS — I472 Ventricular tachycardia: Secondary | ICD-10-CM | POA: Diagnosis not present

## 2018-06-09 DIAGNOSIS — Z7982 Long term (current) use of aspirin: Secondary | ICD-10-CM | POA: Diagnosis not present

## 2018-06-09 DIAGNOSIS — Z86010 Personal history of colonic polyps: Secondary | ICD-10-CM | POA: Diagnosis not present

## 2018-06-09 DIAGNOSIS — Z471 Aftercare following joint replacement surgery: Secondary | ICD-10-CM | POA: Diagnosis not present

## 2018-06-09 DIAGNOSIS — Z96651 Presence of right artificial knee joint: Secondary | ICD-10-CM | POA: Diagnosis not present

## 2018-06-09 DIAGNOSIS — Z9181 History of falling: Secondary | ICD-10-CM | POA: Diagnosis not present

## 2018-06-09 DIAGNOSIS — I700 Atherosclerosis of aorta: Secondary | ICD-10-CM | POA: Diagnosis not present

## 2018-06-09 DIAGNOSIS — E119 Type 2 diabetes mellitus without complications: Secondary | ICD-10-CM | POA: Diagnosis not present

## 2018-06-09 DIAGNOSIS — Z7984 Long term (current) use of oral hypoglycemic drugs: Secondary | ICD-10-CM | POA: Diagnosis not present

## 2018-06-09 DIAGNOSIS — J4520 Mild intermittent asthma, uncomplicated: Secondary | ICD-10-CM | POA: Diagnosis not present

## 2018-06-09 DIAGNOSIS — I10 Essential (primary) hypertension: Secondary | ICD-10-CM | POA: Diagnosis not present

## 2018-06-09 DIAGNOSIS — M216X2 Other acquired deformities of left foot: Secondary | ICD-10-CM | POA: Diagnosis not present

## 2018-06-09 DIAGNOSIS — E785 Hyperlipidemia, unspecified: Secondary | ICD-10-CM | POA: Diagnosis not present

## 2018-06-10 ENCOUNTER — Other Ambulatory Visit (INDEPENDENT_AMBULATORY_CARE_PROVIDER_SITE_OTHER): Payer: Medicare Other

## 2018-06-10 ENCOUNTER — Encounter: Payer: Self-pay | Admitting: Internal Medicine

## 2018-06-10 ENCOUNTER — Ambulatory Visit (INDEPENDENT_AMBULATORY_CARE_PROVIDER_SITE_OTHER): Payer: Medicare Other | Admitting: Internal Medicine

## 2018-06-10 ENCOUNTER — Other Ambulatory Visit: Payer: Self-pay | Admitting: Internal Medicine

## 2018-06-10 VITALS — BP 132/78 | HR 63 | Temp 97.9°F | Ht 63.0 in | Wt 164.0 lb

## 2018-06-10 DIAGNOSIS — I10 Essential (primary) hypertension: Secondary | ICD-10-CM

## 2018-06-10 DIAGNOSIS — D649 Anemia, unspecified: Secondary | ICD-10-CM | POA: Diagnosis not present

## 2018-06-10 DIAGNOSIS — E119 Type 2 diabetes mellitus without complications: Secondary | ICD-10-CM

## 2018-06-10 DIAGNOSIS — E785 Hyperlipidemia, unspecified: Secondary | ICD-10-CM | POA: Diagnosis not present

## 2018-06-10 DIAGNOSIS — N179 Acute kidney failure, unspecified: Secondary | ICD-10-CM

## 2018-06-10 LAB — CBC WITH DIFFERENTIAL/PLATELET
BASOS PCT: 0.8 % (ref 0.0–3.0)
Basophils Absolute: 0.1 10*3/uL (ref 0.0–0.1)
EOS PCT: 0.8 % (ref 0.0–5.0)
Eosinophils Absolute: 0.1 10*3/uL (ref 0.0–0.7)
HEMATOCRIT: 34.2 % — AB (ref 36.0–46.0)
HEMOGLOBIN: 11.1 g/dL — AB (ref 12.0–15.0)
LYMPHS PCT: 22.5 % (ref 12.0–46.0)
Lymphs Abs: 1.8 10*3/uL (ref 0.7–4.0)
MCHC: 32.5 g/dL (ref 30.0–36.0)
MCV: 82.6 fl (ref 78.0–100.0)
MONO ABS: 0.9 10*3/uL (ref 0.1–1.0)
Monocytes Relative: 11.8 % (ref 3.0–12.0)
NEUTROS ABS: 5.1 10*3/uL (ref 1.4–7.7)
Neutrophils Relative %: 64.1 % (ref 43.0–77.0)
Platelets: 268 10*3/uL (ref 150.0–400.0)
RBC: 4.14 Mil/uL (ref 3.87–5.11)
RDW: 16.3 % — AB (ref 11.5–15.5)
WBC: 7.9 10*3/uL (ref 4.0–10.5)

## 2018-06-10 LAB — IBC PANEL
IRON: 44 ug/dL (ref 42–145)
Saturation Ratios: 11.7 % — ABNORMAL LOW (ref 20.0–50.0)
Transferrin: 269 mg/dL (ref 212.0–360.0)

## 2018-06-10 LAB — BASIC METABOLIC PANEL
BUN: 29 mg/dL — AB (ref 6–23)
CO2: 24 mEq/L (ref 19–32)
CREATININE: 1.63 mg/dL — AB (ref 0.40–1.20)
Calcium: 9.8 mg/dL (ref 8.4–10.5)
Chloride: 107 mEq/L (ref 96–112)
GFR: 40.54 mL/min — AB (ref >60.00)
GLUCOSE: 102 mg/dL — AB (ref 70–99)
POTASSIUM: 3.9 meq/L (ref 3.5–5.1)
Sodium: 141 mEq/L (ref 135–145)

## 2018-06-10 NOTE — Assessment & Plan Note (Signed)
stable overall by history and exam, recent data reviewed with pt, and pt to continue medical treatment as before,  to f/u any worsening symptoms or concerns, ok to stay off the toprol

## 2018-06-10 NOTE — Assessment & Plan Note (Signed)
stable overall by history and exam, recent data reviewed with pt, and pt to continue medical treatment as before,  to f/u any worsening symptoms or concerns Lab Results  Component Value Date   LDLCALC 60 03/26/2018

## 2018-06-10 NOTE — Patient Instructions (Addendum)
Please continue all other medications as before, and refills have been done if requested.  Please have the pharmacy call with any other refills you may need.  Please continue your efforts at being more active, low cholesterol diet, and weight control.  You are otherwise up to date with prevention measures today.  Please keep your appointments with your specialists as you may have planned  Please go to the LAB in the Basement (turn left off the elevator) for the tests to be done today  You will be contacted by phone if any changes need to be made immediately.  Otherwise, you will receive a letter about your results with an explanation, but please check with MyChart first.  Please remember to sign up for MyChart if you have not done so, as this will be important to you in the future with finding out test results, communicating by private email, and scheduling acute appointments online when needed.  Please return in Jul 25, 2018 as planned

## 2018-06-10 NOTE — Progress Notes (Signed)
Subjective:    Patient ID: Brooke [name], female    DOB: December 28, 1950, 67 y.o.   MRN: [id number]  HPI  Here to f/u after right knee TKR mid oct 2019 then rehab stay, now home since nov 8, using the rolling walker, PT coming still 3 times per wk; no falls, fever, cough and Denies urinary symptoms such as dysuria, frequency, urgency, flank pain, hematuria or n/v, fever, chills. Has f/u appt next week with ortho, recently changed ibuprofen to oxycontin but ran out last night.  Pt denies chest pain, increased sob or doe, wheezing, orthopnea, PND, increased LE swelling, palpitations, dizziness or syncope. Pt denies new neurological symptoms such as new headache, or facial or extremity weakness or numbness .   Pt denies polydipsia, polyuria.    States she has not taken the toprol XL, just does not want to take.   Pt denies fever, wt loss, night sweats, loss of appetite, or other constitutional symptoms  Not taking the iron except for in the rehab.  Had significant anemia post knee surgury with low of 7.9, no overt bleeding since. Does c/o ongoing fatigue, but denies signficant daytime hypersomnolence. Past Medical History:  Diagnosis Date  . Alopecia   . Aortic atherosclerosis ([place])   . Asthma    FOLLOWED BY PCP  . Eczema   . GERD (gastroesophageal reflux disease)   . Gout    04-28-2018--- per pt stable , as been a while since last episode  . History of colon polyps   . History of syncope 2015  . Hyperlipidemia   . Hypertension   . Hypothyroidism   . OA (osteoarthritis) of knee    bilateral  . PVC's (premature ventricular contractions)   . Toxic multinodular goiter    03/ 2003  s/p RAI  . Type 2 diabetes mellitus ([place])    followed by pcp  . Ventricular tachycardia, polymorphic ([place]) 01/04/2014   primary cardiologist-- dr Tressia Miners turner (hx monitor 2015 showed couplet PVCs, as trigger)  . Wears glasses   . Wears partial dentures    upper   Past Surgical History:  Procedure Laterality Date    . ABDOMINAL HYSTERECTOMY  10/22/1999   WITH BSO  . CATARACT EXTRACTION W/ INTRAOCULAR LENS IMPLANT Left YRS AGO  . EXCISION ABDOMINAL WALL MASS  12-20-2005   dr Margot Chimes @[facility]    neruofibroma  . LAPAROSCOPIC CHOLECYSTECTOMY  10/01/2002   dr Margot Chimes @[facility]   . LEFT HEART CATHETERIZATION WITH CORONARY ANGIOGRAM N/A 01/07/2014   Procedure: LEFT HEART CATHETERIZATION WITH CORONARY ANGIOGRAM;  Surgeon: Peter M Martinique, MD;  Location: [facility] CATH LAB;  Service: Cardiovascular;  Laterality: N/A;  . RASTELLI PROCEDURE  6/98 neg  . RENAL ARTERY STENT Left 11/2003   angioplasty and stenting  . RENAL ARTERY STENT Left 02/2005   re-stenting  . TOTAL KNEE ARTHROPLASTY Right 05/05/2018   Procedure: RIGHT TOTAL KNEE ARTHROPLASTY;  Surgeon: Frederik Pear, MD;  Location: [facility];  Service: Orthopedics;  Laterality: Right;    reports that she has never smoked. She has never used smokeless tobacco. She reports that she does not drink alcohol or use drugs. family history includes Diabetes in her mother; Heart disease in her father. Allergies  Allergen Reactions  . Ace Inhibitors Swelling       . Codeine Nausea And Vomiting and Rash  . Hydrocodone Nausea And Vomiting  . Tramadol Nausea And Vomiting   Current Outpatient Medications on File Prior to Visit  Medication Sig Dispense Refill  .  albuterol (PROVENTIL HFA;VENTOLIN HFA) 108 (90 Base) MCG/ACT inhaler Inhale 2 puffs into the lungs every 6 (six) hours as needed for wheezing. 1 Inhaler 11  . allopurinol (ZYLOPRIM) 100 MG tablet Take 1 tablet (100 mg total) by mouth daily. (Patient taking differently: Take 100 mg by mouth every morning. ) 90 tablet 3  . AMBULATORY NON FORMULARY MEDICATION Medication Name: Rolling walker 1 Device 0  . amLODipine (NORVASC) 10 MG tablet Take 1 tablet (10 mg total) by mouth daily. (Patient taking differently: Take 10 mg by mouth every morning. ) 90 tablet 3  . aspirin EC 81 MG tablet Take 1 tablet (81 mg total) by mouth 2 (two) times  daily. 60 tablet 0  . citalopram (CELEXA) 20 MG tablet Take 1 tablet (20 mg total) by mouth daily. (Patient taking differently: Take 20 mg by mouth every morning. ) 90 tablet 3  . clonazePAM (KLONOPIN) 0.5 MG tablet Take 1 tablet (0.5 mg total) by mouth 2 (two) times daily as needed (vertigo). 30 tablet 2  . fluticasone (FLOVENT HFA) 110 MCG/ACT inhaler Inhale 2 puffs into the lungs 2 (two) times daily. (Patient taking differently: Inhale 2 puffs into the lungs 2 (two) times daily. ) 12 g 12  . gabapentin (NEURONTIN) 100 MG capsule Take 1 capsule (100 mg total) by mouth at bedtime. 90 capsule 1  . hydrochlorothiazide (HYDRODIURIL) 25 MG tablet Take 1 tablet (25 mg total) by mouth daily. 90 tablet 3  . KLOR-CON M20 20 MEQ tablet TAKE 1 TABLET BY MOUTH TWICE A DAY 60 tablet 11  . meclizine (ANTIVERT) 25 MG tablet Take 1 tablet (25 mg total) by mouth 3 (three) times daily as needed for dizziness. 30 tablet 5  . metFORMIN (GLUCOPHAGE-XR) 500 MG 24 hr tablet TAKE ONE TABLET BY MOUTH ONCE DAILY WITH  BREAKFAST (Patient taking differently: Take 500 mg by mouth daily with breakfast. TAKE ONE TABLET BY MOUTH ONCE DAILY WITH  BREAKFAST) 90 tablet 3  . omeprazole (PRILOSEC) 20 MG capsule Take 1 capsule (20 mg total) by mouth 2 (two) times daily. 60 capsule 11  . oxyCODONE-acetaminophen (PERCOCET/ROXICET) 5-325 MG tablet Take 1 tablet by mouth every 4 (four) hours as needed for severe pain. 30 tablet 0  . rosuvastatin (CRESTOR) 20 MG tablet Take 1 tablet (20 mg total) by mouth daily. (Patient taking differently: Take 20 mg by mouth every morning. ) 90 tablet 3  . tiZANidine (ZANAFLEX) 2 MG tablet Take 1 tablet (2 mg total) by mouth every 6 (six) hours as needed. 60 tablet 0  . zolpidem (AMBIEN) 10 MG tablet Take 1 tablet (10 mg total) by mouth at bedtime as needed for sleep. 90 tablet 1   No current facility-administered medications on file prior to visit.    Review of Systems  Constitutional: Negative for  other unusual diaphoresis or sweats HENT: Negative for ear discharge or swelling Eyes: Negative for other worsening visual disturbances Respiratory: Negative for stridor or other swelling  Gastrointestinal: Negative for worsening distension or other blood Genitourinary: Negative for retention or other urinary change Musculoskeletal: Negative for other MSK pain or swelling Skin: Negative for color change or other new lesions Neurological: Negative for worsening tremors and other numbness  Psychiatric/Behavioral: Negative for worsening agitation or other fatigue All other system neg per pt    Objective:   Physical Exam BP 132/78   Pulse 63   Temp 97.9 F (36.6 C) (Oral)   Ht 5\' 3"  (1.6 m)   Wt 164  lb (74.4 kg)   SpO2 96%   BMI 29.05 kg/m  VS noted,  Constitutional: Pt appears in NAD HENT: Head: NCAT.  Right Ear: External ear normal.  Left Ear: External ear normal.  Eyes: . Pupils are equal, round, and reactive to light. Conjunctivae and EOM are normal Nose: without d/c or deformity Neck: Neck supple. Gross normal ROM Cardiovascular: Normal rate and regular rhythm.   Pulmonary/Chest: Effort normal and breath sounds without rales or wheezing.  Abd:  Soft, NT, ND, + BS, no organomegaly Neurological: Pt is alert. At baseline orientation, motor grossly intact Skin: Skin is warm. No rashes, other new lesions, no LE edema Psychiatric: Pt behavior is normal without agitation  No other exam findings Lab Results  Component Value Date   WBC 7.3 05/13/2018   HGB 8.4 (A) 05/13/2018   HCT 25 (A) 05/13/2018   PLT 309 05/13/2018   GLUCOSE 142 (H) 05/06/2018   CHOL 128 03/26/2018   TRIG 82 03/26/2018   HDL 52 03/26/2018   LDLDIRECT 139.1 09/05/2009   LDLCALC 60 03/26/2018   ALT 6 03/26/2018   AST 10 03/26/2018   NA 141 05/06/2018   K 3.6 05/06/2018   CL 110 05/06/2018   CREATININE 0.94 05/06/2018   BUN 19 05/06/2018   CO2 23 05/06/2018   TSH 1.85 01/10/2017   INR 0.93  04/28/2018   HGBA1C 5.6 05/05/2018   MICROALBUR 1.0 01/10/2017       Assessment & Plan:

## 2018-06-10 NOTE — Assessment & Plan Note (Signed)
stable overall by history and exam, recent data reviewed with pt, and pt to continue medical treatment as before,  to f/u any worsening symptoms or concerns  

## 2018-06-10 NOTE — Assessment & Plan Note (Signed)
With some post surgury ABL - for f/u lab today, has not taken iron since rehab d/c due to cost

## 2018-06-11 ENCOUNTER — Telehealth: Payer: Self-pay

## 2018-06-11 DIAGNOSIS — M109 Gout, unspecified: Secondary | ICD-10-CM | POA: Diagnosis not present

## 2018-06-11 DIAGNOSIS — M1712 Unilateral primary osteoarthritis, left knee: Secondary | ICD-10-CM | POA: Diagnosis not present

## 2018-06-11 DIAGNOSIS — E119 Type 2 diabetes mellitus without complications: Secondary | ICD-10-CM | POA: Diagnosis not present

## 2018-06-11 DIAGNOSIS — Z7982 Long term (current) use of aspirin: Secondary | ICD-10-CM | POA: Diagnosis not present

## 2018-06-11 DIAGNOSIS — J4520 Mild intermittent asthma, uncomplicated: Secondary | ICD-10-CM | POA: Diagnosis not present

## 2018-06-11 DIAGNOSIS — Z96651 Presence of right artificial knee joint: Secondary | ICD-10-CM | POA: Diagnosis not present

## 2018-06-11 DIAGNOSIS — I10 Essential (primary) hypertension: Secondary | ICD-10-CM | POA: Diagnosis not present

## 2018-06-11 DIAGNOSIS — K219 Gastro-esophageal reflux disease without esophagitis: Secondary | ICD-10-CM | POA: Diagnosis not present

## 2018-06-11 DIAGNOSIS — Z9181 History of falling: Secondary | ICD-10-CM | POA: Diagnosis not present

## 2018-06-11 DIAGNOSIS — Z86010 Personal history of colonic polyps: Secondary | ICD-10-CM | POA: Diagnosis not present

## 2018-06-11 DIAGNOSIS — Z471 Aftercare following joint replacement surgery: Secondary | ICD-10-CM | POA: Diagnosis not present

## 2018-06-11 DIAGNOSIS — M216X2 Other acquired deformities of left foot: Secondary | ICD-10-CM | POA: Diagnosis not present

## 2018-06-11 DIAGNOSIS — E785 Hyperlipidemia, unspecified: Secondary | ICD-10-CM | POA: Diagnosis not present

## 2018-06-11 DIAGNOSIS — I700 Atherosclerosis of aorta: Secondary | ICD-10-CM | POA: Diagnosis not present

## 2018-06-11 DIAGNOSIS — Z7984 Long term (current) use of oral hypoglycemic drugs: Secondary | ICD-10-CM | POA: Diagnosis not present

## 2018-06-11 DIAGNOSIS — I472 Ventricular tachycardia: Secondary | ICD-10-CM | POA: Diagnosis not present

## 2018-06-11 NOTE — Telephone Encounter (Signed)
Pt has been informed of results and expressed understanding.  °

## 2018-06-11 NOTE — Telephone Encounter (Signed)
-----   Message from Biagio Borg, MD sent at 06/10/2018  1:15 PM EST ----- Left message on MyChart, pt to cont same tx except  The test results show that your current treatment is OK, as the blood count is improved, but there is also new kidney slowing, which I think may be related to some dehydration.  Please HOLD on taking the fluid pill (hydrochlorothiazide) and HOLD the potassium pill with it for 3 days. Try to drink some fluids a bit more than usual   Please return on Friday Nov 22 to recheck your kidney testing, and if improved we'll have to decide how to take any further fluid pill and potassium.    Shirron to please inform pt, I will do order for repeat blood test for friday

## 2018-06-13 DIAGNOSIS — M1712 Unilateral primary osteoarthritis, left knee: Secondary | ICD-10-CM | POA: Diagnosis not present

## 2018-06-13 DIAGNOSIS — Z7982 Long term (current) use of aspirin: Secondary | ICD-10-CM | POA: Diagnosis not present

## 2018-06-13 DIAGNOSIS — Z96651 Presence of right artificial knee joint: Secondary | ICD-10-CM | POA: Diagnosis not present

## 2018-06-13 DIAGNOSIS — Z9181 History of falling: Secondary | ICD-10-CM | POA: Diagnosis not present

## 2018-06-13 DIAGNOSIS — Z7984 Long term (current) use of oral hypoglycemic drugs: Secondary | ICD-10-CM | POA: Diagnosis not present

## 2018-06-13 DIAGNOSIS — M109 Gout, unspecified: Secondary | ICD-10-CM | POA: Diagnosis not present

## 2018-06-13 DIAGNOSIS — I700 Atherosclerosis of aorta: Secondary | ICD-10-CM | POA: Diagnosis not present

## 2018-06-13 DIAGNOSIS — K219 Gastro-esophageal reflux disease without esophagitis: Secondary | ICD-10-CM | POA: Diagnosis not present

## 2018-06-13 DIAGNOSIS — M216X2 Other acquired deformities of left foot: Secondary | ICD-10-CM | POA: Diagnosis not present

## 2018-06-13 DIAGNOSIS — E785 Hyperlipidemia, unspecified: Secondary | ICD-10-CM | POA: Diagnosis not present

## 2018-06-13 DIAGNOSIS — Z471 Aftercare following joint replacement surgery: Secondary | ICD-10-CM | POA: Diagnosis not present

## 2018-06-13 DIAGNOSIS — Z86010 Personal history of colonic polyps: Secondary | ICD-10-CM | POA: Diagnosis not present

## 2018-06-13 DIAGNOSIS — E119 Type 2 diabetes mellitus without complications: Secondary | ICD-10-CM | POA: Diagnosis not present

## 2018-06-13 DIAGNOSIS — J4520 Mild intermittent asthma, uncomplicated: Secondary | ICD-10-CM | POA: Diagnosis not present

## 2018-06-13 DIAGNOSIS — I472 Ventricular tachycardia: Secondary | ICD-10-CM | POA: Diagnosis not present

## 2018-06-13 DIAGNOSIS — I10 Essential (primary) hypertension: Secondary | ICD-10-CM | POA: Diagnosis not present

## 2018-06-16 ENCOUNTER — Other Ambulatory Visit: Payer: Self-pay | Admitting: Internal Medicine

## 2018-06-16 ENCOUNTER — Telehealth: Payer: Self-pay

## 2018-06-16 ENCOUNTER — Encounter: Payer: Self-pay | Admitting: Internal Medicine

## 2018-06-16 ENCOUNTER — Other Ambulatory Visit (INDEPENDENT_AMBULATORY_CARE_PROVIDER_SITE_OTHER): Payer: Medicare Other

## 2018-06-16 ENCOUNTER — Ambulatory Visit: Payer: Self-pay

## 2018-06-16 DIAGNOSIS — N179 Acute kidney failure, unspecified: Secondary | ICD-10-CM

## 2018-06-16 LAB — BASIC METABOLIC PANEL
BUN: 17 mg/dL (ref 6–23)
CHLORIDE: 108 meq/L (ref 96–112)
CO2: 24 mEq/L (ref 19–32)
Calcium: 9.5 mg/dL (ref 8.4–10.5)
Creatinine, Ser: 1.44 mg/dL — ABNORMAL HIGH (ref 0.40–1.20)
GFR: 46.77 mL/min — ABNORMAL LOW (ref >60.00)
Glucose, Bld: 107 mg/dL — ABNORMAL HIGH (ref 70–99)
POTASSIUM: 3 meq/L — AB (ref 3.5–5.1)
Sodium: 143 mEq/L (ref 135–145)

## 2018-06-16 NOTE — Telephone Encounter (Signed)
Called pt, LVM.   CRM created.  

## 2018-06-16 NOTE — Telephone Encounter (Signed)
-----   Message from Biagio Borg, MD sent at 06/16/2018 12:36 PM EST ----- Letter sent, cont same tx except  The test results show that your current treatment is OK, as the kidney function is improved, but still mildly slowed compared to before your last hospitalization.  Please continue the plan by taking the lasix as before, but also the decreased (by half) the spironolactone.  Please plan to return to the same LAB only in 1 week to recheck.   Asif Muchow to please inform pt, I will do order for repeat lab in 1 week

## 2018-06-16 NOTE — Telephone Encounter (Signed)
Provided lab results of Dr.  Jeneen Rinks John,06/16/18.  Patient voiced understanding.  States she will keep scheduled appointment.

## 2018-07-02 DIAGNOSIS — E119 Type 2 diabetes mellitus without complications: Secondary | ICD-10-CM | POA: Diagnosis not present

## 2018-07-04 ENCOUNTER — Other Ambulatory Visit: Payer: Self-pay | Admitting: Internal Medicine

## 2018-07-04 DIAGNOSIS — M1000 Idiopathic gout, unspecified site: Secondary | ICD-10-CM

## 2018-07-14 ENCOUNTER — Other Ambulatory Visit: Payer: Self-pay | Admitting: Internal Medicine

## 2018-07-24 ENCOUNTER — Ambulatory Visit: Payer: Medicare Other | Admitting: Neurology

## 2018-07-25 ENCOUNTER — Telehealth: Payer: Self-pay

## 2018-07-25 ENCOUNTER — Other Ambulatory Visit (INDEPENDENT_AMBULATORY_CARE_PROVIDER_SITE_OTHER): Payer: Medicare Other

## 2018-07-25 ENCOUNTER — Encounter: Payer: Self-pay | Admitting: Internal Medicine

## 2018-07-25 ENCOUNTER — Ambulatory Visit (INDEPENDENT_AMBULATORY_CARE_PROVIDER_SITE_OTHER): Payer: Medicare Other | Admitting: Internal Medicine

## 2018-07-25 ENCOUNTER — Telehealth: Payer: Self-pay | Admitting: Internal Medicine

## 2018-07-25 ENCOUNTER — Other Ambulatory Visit: Payer: Self-pay | Admitting: Internal Medicine

## 2018-07-25 VITALS — BP 126/78 | HR 63 | Temp 98.0°F | Ht 63.0 in | Wt 155.0 lb

## 2018-07-25 DIAGNOSIS — F418 Other specified anxiety disorders: Secondary | ICD-10-CM | POA: Diagnosis not present

## 2018-07-25 DIAGNOSIS — N183 Chronic kidney disease, stage 3 unspecified: Secondary | ICD-10-CM

## 2018-07-25 DIAGNOSIS — M545 Low back pain, unspecified: Secondary | ICD-10-CM

## 2018-07-25 DIAGNOSIS — M549 Dorsalgia, unspecified: Secondary | ICD-10-CM

## 2018-07-25 DIAGNOSIS — D649 Anemia, unspecified: Secondary | ICD-10-CM | POA: Diagnosis not present

## 2018-07-25 DIAGNOSIS — G8929 Other chronic pain: Secondary | ICD-10-CM | POA: Diagnosis not present

## 2018-07-25 DIAGNOSIS — E876 Hypokalemia: Secondary | ICD-10-CM

## 2018-07-25 DIAGNOSIS — N1832 Chronic kidney disease, stage 3b: Secondary | ICD-10-CM | POA: Insufficient documentation

## 2018-07-25 LAB — CBC WITH DIFFERENTIAL/PLATELET
BASOS ABS: 0 10*3/uL (ref 0.0–0.1)
Basophils Relative: 0.3 % (ref 0.0–3.0)
EOS PCT: 0.3 % (ref 0.0–5.0)
Eosinophils Absolute: 0 10*3/uL (ref 0.0–0.7)
HEMATOCRIT: 37.4 % (ref 36.0–46.0)
Hemoglobin: 12.3 g/dL (ref 12.0–15.0)
LYMPHS ABS: 2.4 10*3/uL (ref 0.7–4.0)
LYMPHS PCT: 22.5 % (ref 12.0–46.0)
MCHC: 32.8 g/dL (ref 30.0–36.0)
MCV: 81.1 fl (ref 78.0–100.0)
MONOS PCT: 9.9 % (ref 3.0–12.0)
Monocytes Absolute: 1 10*3/uL (ref 0.1–1.0)
NEUTROS ABS: 7 10*3/uL (ref 1.4–7.7)
NEUTROS PCT: 67 % (ref 43.0–77.0)
Platelets: 338 10*3/uL (ref 150.0–400.0)
RBC: 4.62 Mil/uL (ref 3.87–5.11)
RDW: 15.8 % — ABNORMAL HIGH (ref 11.5–15.5)
WBC: 10.5 10*3/uL (ref 4.0–10.5)

## 2018-07-25 LAB — BASIC METABOLIC PANEL
BUN: 32 mg/dL — AB (ref 6–23)
CALCIUM: 9.9 mg/dL (ref 8.4–10.5)
CHLORIDE: 105 meq/L (ref 96–112)
CO2: 24 meq/L (ref 19–32)
CREATININE: 1.63 mg/dL — AB (ref 0.40–1.20)
GFR: 40.53 mL/min — ABNORMAL LOW (ref >60.00)
Glucose, Bld: 107 mg/dL — ABNORMAL HIGH (ref 70–99)
Potassium: 2.7 mEq/L — CL (ref 3.5–5.1)
Sodium: 140 mEq/L (ref 135–145)

## 2018-07-25 MED ORDER — METFORMIN HCL ER 500 MG PO TB24: ORAL_TABLET | ORAL | 3 refills | Status: DC

## 2018-07-25 NOTE — Assessment & Plan Note (Signed)
Ok for tylenol prn, o/w stable overall by history and exam, recent data reviewed with pt, and pt to continue medical treatment as before,  to f/u any worsening symptoms or concerns=

## 2018-07-25 NOTE — Telephone Encounter (Signed)
Called pt, LVM.   CRM created.  

## 2018-07-25 NOTE — Assessment & Plan Note (Signed)
Mild persistent, ok for counseling referral as requested

## 2018-07-25 NOTE — Telephone Encounter (Signed)
Lab called stating they have a critical potassium result for the patient of 2.7

## 2018-07-25 NOTE — Assessment & Plan Note (Addendum)
stable overall by history and exam, recent data reviewed with pt, to d/c ibuprofen, and pt to continue medical treatment as before,  to f/u any worsening symptoms or concerns, for f/u lab today

## 2018-07-25 NOTE — Patient Instructions (Signed)
Please stop the ibuprofen due to the kidneys  Please try to get by with tylenol as needed for back pain  You will be contacted regarding the referral for: Counseling with psychology  Please continue all other medications as before, and refills have been done if requested including the metformin  Please have the pharmacy call with any other refills you may need.  Please continue your efforts at being more active, low cholesterol diet, and weight control.  Please keep your appointments with your specialists as you may have planned  Please go to the LAB in the Basement (turn left off the elevator) for the tests to be done today  You will be contacted by phone if any changes need to be made immediately.  Otherwise, you will receive a letter about your results with an explanation, but please check with MyChart first.  Please remember to sign up for MyChart if you have not done so, as this will be important to you in the future with finding out test results, communicating by private email, and scheduling acute appointments online when needed.  Please return in 6 months, or sooner if needed, with Lab testing done 3-5 days before

## 2018-07-25 NOTE — Assessment & Plan Note (Signed)
stable overall by history and exam, recent data reviewed with pt, and pt to continue medical treatment as before,  to f/u any worsening symptoms or concerns, for f/u lab today 

## 2018-07-25 NOTE — Progress Notes (Signed)
Subjective:    Patient ID: Brooke [name], female    DOB: August 17, 1950, 68 y.o.   MRN: [id number]  HPI  Here to f/u; overall doing ok,  Pt denies chest pain, increasing sob or doe, wheezing, orthopnea, PND, increased LE swelling, palpitations, dizziness or syncope.  Pt denies new neurological symptoms such as new headache, or facial or extremity weakness or numbness.  Pt denies polydipsia, polyuria, or low sugar episode.  Pt states overall good compliance with meds, mostly trying to follow appropriate diet, with wt overall stable,  but little exercise however. Has lost wt with less appetite.  She thinks due to stress and mild depression, does not want more medicaitons, asks for counseling referral.   Wt Readings from Last 3 Encounters:  07/25/18 155 lb (70.3 kg)  06/10/18 164 lb (74.4 kg)  05/12/18 165 lb 5.6 oz (75 kg)  Pt continues to have recurring LBP without change in severity, bowel or bladder change, fever, wt loss,  worsening LE pain/numbness/weakness, gait change or falls, but is taking prn ibuprofen despite her ckd Past Medical History:  Diagnosis Date  . Alopecia   . Aortic atherosclerosis ([place])   . Asthma    FOLLOWED BY PCP  . Eczema   . GERD (gastroesophageal reflux disease)   . Gout    04-28-2018--- per pt stable , as been a while since last episode  . History of colon polyps   . History of syncope 2015  . Hyperlipidemia   . Hypertension   . Hypothyroidism   . OA (osteoarthritis) of knee    bilateral  . PVC's (premature ventricular contractions)   . Toxic multinodular goiter    03/ 2003  s/p RAI  . Type 2 diabetes mellitus ([place])    followed by pcp  . Ventricular tachycardia, polymorphic ([place]) 01/04/2014   primary cardiologist-- dr Tressia Miners turner (hx monitor 2015 showed couplet PVCs, as trigger)  . Wears glasses   . Wears partial dentures    upper   Past Surgical History:  Procedure Laterality Date  . ABDOMINAL HYSTERECTOMY  10/22/1999   WITH BSO  . CATARACT  EXTRACTION W/ INTRAOCULAR LENS IMPLANT Left YRS AGO  . EXCISION ABDOMINAL WALL MASS  12-20-2005   dr Margot Chimes @[facility]    neruofibroma  . LAPAROSCOPIC CHOLECYSTECTOMY  10/01/2002   dr Margot Chimes @[facility]   . LEFT HEART CATHETERIZATION WITH CORONARY ANGIOGRAM N/A 01/07/2014   Procedure: LEFT HEART CATHETERIZATION WITH CORONARY ANGIOGRAM;  Surgeon: Peter M Martinique, MD;  Location[facility] CATH LAB;  Service: Cardiovascular;  Laterality: N/A;  . RASTELLI PROCEDURE  6/98 neg  . RENAL ARTERY STENT Left 11/2003   angioplasty and stenting  . RENAL ARTERY STENT Left 02/2005   re-stenting  . TOTAL KNEE ARTHROPLASTY Right 05/05/2018   Procedure: RIGHT TOTAL KNEE ARTHROPLASTY;  Surgeon: Frederik Pear, MD;  Location: [facility];  Service: Orthopedics;  Laterality: Right;    reports that she has never smoked. She has never used smokeless tobacco. She reports that she does not drink alcohol or use drugs. family history includes Diabetes in her mother; Heart disease in her father. Allergies  Allergen Reactions  . Ace Inhibitors Swelling       . Codeine Nausea And Vomiting and Rash  . Hydrocodone Nausea And Vomiting  . Tramadol Nausea And Vomiting   Current Outpatient Medications on File Prior to Visit  Medication Sig Dispense Refill  . albuterol (PROVENTIL HFA;VENTOLIN HFA) 108 (90 Base) MCG/ACT inhaler Inhale 2 puffs into the lungs every  6 (six) hours as needed for wheezing. 1 Inhaler 11  . allopurinol (ZYLOPRIM) 100 MG tablet TAKE 1 TABLET BY MOUTH EVERY DAY 90 tablet 3  . AMBULATORY NON FORMULARY MEDICATION Medication Name: Rolling walker 1 Device 0  . amLODipine (NORVASC) 10 MG tablet Take 1 tablet (10 mg total) by mouth daily. (Patient taking differently: Take 10 mg by mouth every morning. ) 90 tablet 3  . aspirin EC 81 MG tablet Take 1 tablet (81 mg total) by mouth 2 (two) times daily. 60 tablet 0  . citalopram (CELEXA) 20 MG tablet Take 1 tablet (20 mg total) by mouth daily. (Patient taking differently: Take 20 mg by  mouth every morning. ) 90 tablet 3  . clonazePAM (KLONOPIN) 0.5 MG tablet Take 1 tablet (0.5 mg total) by mouth 2 (two) times daily as needed (vertigo). 30 tablet 2  . fluticasone (FLOVENT HFA) 110 MCG/ACT inhaler Inhale 2 puffs into the lungs 2 (two) times daily. (Patient taking differently: Inhale 2 puffs into the lungs 2 (two) times daily. ) 12 g 12  . gabapentin (NEURONTIN) 100 MG capsule Take 1 capsule (100 mg total) by mouth at bedtime. 90 capsule 1  . hydrochlorothiazide (HYDRODIURIL) 25 MG tablet TAKE 1 TABLET BY MOUTH EVERY DAY 90 tablet 3  . ibuprofen (ADVIL,MOTRIN) 800 MG tablet TAKE 1 TABLET BY MOUTH 2 TIMES DAILY AS NEEDED AS PAIN 60 tablet 2  . KLOR-CON M20 20 MEQ tablet TAKE 1 TABLET BY MOUTH TWICE A DAY 60 tablet 11  . meclizine (ANTIVERT) 25 MG tablet Take 1 tablet (25 mg total) by mouth 3 (three) times daily as needed for dizziness. 30 tablet 5  . omeprazole (PRILOSEC) 20 MG capsule Take 1 capsule (20 mg total) by mouth 2 (two) times daily. 60 capsule 11  . oxyCODONE-acetaminophen (PERCOCET/ROXICET) 5-325 MG tablet Take 1 tablet by mouth every 4 (four) hours as needed for severe pain. 30 tablet 0  . rosuvastatin (CRESTOR) 20 MG tablet Take 1 tablet (20 mg total) by mouth daily. (Patient taking differently: Take 20 mg by mouth every morning. ) 90 tablet 3  . tiZANidine (ZANAFLEX) 2 MG tablet Take 1 tablet (2 mg total) by mouth every 6 (six) hours as needed. 60 tablet 0  . zolpidem (AMBIEN) 10 MG tablet Take 1 tablet (10 mg total) by mouth at bedtime as needed for sleep. 90 tablet 1   No current facility-administered medications on file prior to visit.    Review of Systems  Constitutional: Negative for other unusual diaphoresis or sweats HENT: Negative for ear discharge or swelling Eyes: Negative for other worsening visual disturbances Respiratory: Negative for stridor or other swelling  Gastrointestinal: Negative for worsening distension or other blood Genitourinary: Negative  for retention or other urinary change Musculoskeletal: Negative for other MSK pain or swelling Skin: Negative for color change or other new lesions Neurological: Negative for worsening tremors and other numbness  Psychiatric/Behavioral: Negative for worsening agitation or other fatigue All other system neg per pt   Objective:   Physical Exam BP 126/78   Pulse 63   Temp 98 F (36.7 C) (Oral)   Ht 5\' 3"  (1.6 m)   Wt 155 lb (70.3 kg)   SpO2 94%   BMI 27.46 kg/m  VS noted, not ill appearing Constitutional: Pt appears in NAD HENT: Head: NCAT.  Right Ear: External ear normal.  Left Ear: External ear normal.  Eyes: . Pupils are equal, round, and reactive to light. Conjunctivae and EOM are  normal Nose: without d/c or deformity Neck: Neck supple. Gross normal ROM Cardiovascular: Normal rate and regular rhythm.   Pulmonary/Chest: Effort normal and breath sounds without rales or wheezing.  Abd:  Soft, NT, ND, + BS, no organomegaly Neurological: Pt is alert. At baseline orientation, motor grossly intact Skin: Skin is warm. No rashes, other new lesions, no LE edema Psychiatric: Pt behavior is normal without agitation , nervous with depressed affect and mood No other exam findings Lab Results  Component Value Date   WBC 10.5 07/25/2018   HGB 12.3 07/25/2018   HCT 37.4 07/25/2018   PLT 338.0 07/25/2018   GLUCOSE 107 (H) 07/25/2018   CHOL 128 03/26/2018   TRIG 82 03/26/2018   HDL 52 03/26/2018   LDLDIRECT 139.1 09/05/2009   LDLCALC 60 03/26/2018   ALT 6 03/26/2018   AST 10 03/26/2018   NA 140 07/25/2018   K 2.7 (LL) 07/25/2018   CL 105 07/25/2018   CREATININE 1.63 (H) 07/25/2018   BUN 32 (H) 07/25/2018   CO2 24 07/25/2018   TSH 1.85 01/10/2017   INR 0.93 04/28/2018   HGBA1C 5.6 05/05/2018   MICROALBUR 1.0 01/10/2017       Assessment & Plan:

## 2018-07-25 NOTE — Telephone Encounter (Signed)
-----   Message from Biagio Borg, MD sent at 07/25/2018 12:51 PM EST ----- Shirron to please call pt - inform labs are stable except the Potassium is moderately low.  Please verify she is actually taking the 20 meq potassium twice per day as is on her med list,   If so, ok to take an extra 20 meq potassium (so a total of 3 pills of the 20 meq potassium) for 5 days, then please return to lab for recheck after that.    I will place the order

## 2018-07-28 ENCOUNTER — Ambulatory Visit: Payer: Self-pay

## 2018-07-28 NOTE — Telephone Encounter (Signed)
Pt called stating that she was told to take OTC tylenol for pain.Pt states that the tylenol has amphetamine as well. She has taken it Friday and Saturday and developed itching to her face and eyes Her tongue turned red and lip felt funny.  She has stopped the medication on Sunday and her symptoms are mild today. Appointment scheduled per protocol. Care advice read to patient. Pt states that she will not take any more of the OTC medication until she is advised. Reason for Disposition . Hives or itching  Answer Assessment - Initial Assessment Questions 1. APPEARANCE of RASH: "Describe the rash." (e.g., spots, blisters, raised areas, skin peeling, scaly)     None just itching 2. SIZE: "How big are the spots?" (e.g., tip of pen, eraser, coin; inches, centimeters)     N/A 3. LOCATION: "Where is the rash located?"     Face and eyes itching 4. COLOR: "What color is the rash?" (Note: It is difficult to assess rash color in people with darker-colored skin. When this situation occurs, simply ask the caller to describe what they see.)     tongue was red 5. ONSET: "When did the rash begin?"     Whit starting  6. FEVER: "Do you have a fever?" If so, ask: "What is your temperature, how was it measured, and when did it start?"     no 7. ITCHING: "Does the rash itch?" If so, ask: "How bad is the itch?" (Scale 1-10; or mild, moderate, severe)     Was severe after taking medication, but today mild no swelling 8. CAUSE: "What do you think is causing the rash?"     tylenol 9. NEW MEDICATION: "What new medication are you taking?" (e.g., name of antibiotic) "When did you start taking this medication?".   Tylenol OTC  10. OTHER SYMPTOMS: "Do you have any other symptoms?" (e.g., sore throat, fever, joint pain)       No 11. PREGNANCY: "Is there any chance you are pregnant?" "When was your last menstrual period?"     N/A  Protocols used: RASH - WIDESPREAD ON DRUGS-A-AH

## 2018-07-29 ENCOUNTER — Ambulatory Visit: Payer: Medicare Other | Admitting: Internal Medicine

## 2018-07-29 DIAGNOSIS — Z96651 Presence of right artificial knee joint: Secondary | ICD-10-CM | POA: Diagnosis not present

## 2018-07-29 DIAGNOSIS — Z9889 Other specified postprocedural states: Secondary | ICD-10-CM | POA: Diagnosis not present

## 2018-07-29 DIAGNOSIS — M1712 Unilateral primary osteoarthritis, left knee: Secondary | ICD-10-CM | POA: Diagnosis not present

## 2018-07-30 ENCOUNTER — Encounter: Payer: Self-pay | Admitting: Internal Medicine

## 2018-07-30 ENCOUNTER — Ambulatory Visit (INDEPENDENT_AMBULATORY_CARE_PROVIDER_SITE_OTHER): Payer: Medicare Other | Admitting: Internal Medicine

## 2018-07-30 ENCOUNTER — Other Ambulatory Visit (INDEPENDENT_AMBULATORY_CARE_PROVIDER_SITE_OTHER): Payer: Medicare Other

## 2018-07-30 VITALS — BP 136/84 | HR 71 | Temp 98.1°F | Ht 63.0 in | Wt 157.0 lb

## 2018-07-30 DIAGNOSIS — N183 Chronic kidney disease, stage 3 unspecified: Secondary | ICD-10-CM

## 2018-07-30 DIAGNOSIS — I10 Essential (primary) hypertension: Secondary | ICD-10-CM

## 2018-07-30 DIAGNOSIS — M549 Dorsalgia, unspecified: Secondary | ICD-10-CM

## 2018-07-30 DIAGNOSIS — E876 Hypokalemia: Secondary | ICD-10-CM

## 2018-07-30 DIAGNOSIS — G8929 Other chronic pain: Secondary | ICD-10-CM

## 2018-07-30 LAB — BASIC METABOLIC PANEL WITH GFR
BUN: 33 mg/dL — ABNORMAL HIGH (ref 6–23)
CO2: 23 meq/L (ref 19–32)
Calcium: 9.8 mg/dL (ref 8.4–10.5)
Chloride: 110 meq/L (ref 96–112)
Creatinine, Ser: 1.72 mg/dL — ABNORMAL HIGH (ref 0.40–1.20)
GFR: 38.09 mL/min — ABNORMAL LOW
Glucose, Bld: 101 mg/dL — ABNORMAL HIGH (ref 70–99)
Potassium: 3.9 meq/L (ref 3.5–5.1)
Sodium: 141 meq/L (ref 135–145)

## 2018-07-30 LAB — MAGNESIUM: Magnesium: 1.7 mg/dL (ref 1.5–2.5)

## 2018-07-30 MED ORDER — POTASSIUM CHLORIDE ER 10 MEQ PO TBCR: 10.0000 meq | EXTENDED_RELEASE_TABLET | Freq: Two times a day (BID) | ORAL | 11 refills | Status: DC

## 2018-07-30 MED ORDER — IBUPROFEN 800 MG PO TABS: ORAL_TABLET | ORAL | 2 refills | Status: DC

## 2018-07-30 NOTE — Progress Notes (Signed)
Subjective:    Patient ID: Brooke [name], female    DOB: [birth year], 68 y.o.   MRN: [id number]  HPI  Here to f/u chronic LBP, states has itchy rash to tylenol at least twice now resolved, but cannot take this further for pain.  Wants to go back to ibuprofen as this still helped yesterday for moderate to severe pain.  Promises to use only very occasionally prn.  Has been compliant with potassium med and due for f/u lab.  Pt denies chest pain, increased sob or doe, wheezing, orthopnea, PND, increased LE swelling, palpitations, dizziness or syncope.  Pt denies new neurological symptoms such as new headache, or facial or extremity weakness or numbness   Pt denies polydipsia, polyuria, Past Medical History:  Diagnosis Date  . Alopecia   . Aortic atherosclerosis ([place])   . Asthma    FOLLOWED BY PCP  . Eczema   . GERD (gastroesophageal reflux disease)   . Gout    04-28-2018--- per pt stable , as been a while since last episode  . History of colon polyps   . History of syncope 2015  . Hyperlipidemia   . Hypertension   . Hypothyroidism   . OA (osteoarthritis) of knee    bilateral  . PVC's (premature ventricular contractions)   . Toxic multinodular goiter    03/ 2003  s/p RAI  . Type 2 diabetes mellitus ([place])    followed by pcp  . Ventricular tachycardia, polymorphic ([place]) 01/04/2014   primary cardiologist-- dr Tressia Miners turner (hx monitor 2015 showed couplet PVCs, as trigger)  . Wears glasses   . Wears partial dentures    upper   Past Surgical History:  Procedure Laterality Date  . ABDOMINAL HYSTERECTOMY  10/22/1999   WITH BSO  . CATARACT EXTRACTION W/ INTRAOCULAR LENS IMPLANT Left YRS AGO  . EXCISION ABDOMINAL WALL MASS  12-20-2005   dr Margot Chimes @[facility]    neruofibroma  . LAPAROSCOPIC CHOLECYSTECTOMY  10/01/2002   dr Margot Chimes @[facility]   . LEFT HEART CATHETERIZATION WITH CORONARY ANGIOGRAM N/A 01/07/2014   Procedure: LEFT HEART CATHETERIZATION WITH CORONARY ANGIOGRAM;  Surgeon: Peter M Martinique, MD;   Location: [facility] CATH LAB;  Service: Cardiovascular;  Laterality: N/A;  . RASTELLI PROCEDURE  6/98 neg  . RENAL ARTERY STENT Left 11/2003   angioplasty and stenting  . RENAL ARTERY STENT Left 02/2005   re-stenting  . TOTAL KNEE ARTHROPLASTY Right 05/05/2018   Procedure: RIGHT TOTAL KNEE ARTHROPLASTY;  Surgeon: Frederik Pear, MD;  Location: [facility] ORS;  Service: Orthopedics;  Laterality: Right;    reports that she has never smoked. She has never used smokeless tobacco. She reports that she does not drink alcohol or use drugs. family history includes Diabetes in her mother; Heart disease in her father. Allergies  Allergen Reactions  . Tylenol [Acetaminophen]     Rash and itch  . Ace Inhibitors Swelling       . Codeine Nausea And Vomiting and Rash  . Hydrocodone Nausea And Vomiting  . Tramadol Nausea And Vomiting   Current Outpatient Medications on File Prior to Visit  Medication Sig Dispense Refill  . albuterol (PROVENTIL HFA;VENTOLIN HFA) 108 (90 Base) MCG/ACT inhaler Inhale 2 puffs into the lungs every 6 (six) hours as needed for wheezing. 1 Inhaler 11  . allopurinol (ZYLOPRIM) 100 MG tablet TAKE 1 TABLET BY MOUTH EVERY DAY 90 tablet 3  . AMBULATORY NON FORMULARY MEDICATION Medication Name: Rolling walker 1 Device 0  . amLODipine (NORVASC) 10 MG tablet  Take 1 tablet (10 mg total) by mouth daily. (Patient taking differently: Take 10 mg by mouth every morning. ) 90 tablet 3  . aspirin EC 81 MG tablet Take 1 tablet (81 mg total) by mouth 2 (two) times daily. 60 tablet 0  . citalopram (CELEXA) 20 MG tablet Take 1 tablet (20 mg total) by mouth daily. (Patient taking differently: Take 20 mg by mouth every morning. ) 90 tablet 3  . clonazePAM (KLONOPIN) 0.5 MG tablet Take 1 tablet (0.5 mg total) by mouth 2 (two) times daily as needed (vertigo). 30 tablet 2  . fluticasone (FLOVENT HFA) 110 MCG/ACT inhaler Inhale 2 puffs into the lungs 2 (two) times daily. (Patient taking differently: Inhale 2 puffs  into the lungs 2 (two) times daily. ) 12 g 12  . gabapentin (NEURONTIN) 100 MG capsule Take 1 capsule (100 mg total) by mouth at bedtime. 90 capsule 1  . hydrochlorothiazide (HYDRODIURIL) 25 MG tablet TAKE 1 TABLET BY MOUTH EVERY DAY 90 tablet 3  . meclizine (ANTIVERT) 25 MG tablet Take 1 tablet (25 mg total) by mouth 3 (three) times daily as needed for dizziness. 30 tablet 5  . metFORMIN (GLUCOPHAGE-XR) 500 MG 24 hr tablet TAKE ONE TABLET BY MOUTH ONCE DAILY WITH  BREAKFAST 90 tablet 3  . omeprazole (PRILOSEC) 20 MG capsule Take 1 capsule (20 mg total) by mouth 2 (two) times daily. 60 capsule 11  . rosuvastatin (CRESTOR) 20 MG tablet Take 1 tablet (20 mg total) by mouth daily. (Patient taking differently: Take 20 mg by mouth every morning. ) 90 tablet 3  . zolpidem (AMBIEN) 10 MG tablet Take 1 tablet (10 mg total) by mouth at bedtime as needed for sleep. 90 tablet 1   No current facility-administered medications on file prior to visit.    Review of Systems  Constitutional: Negative for other unusual diaphoresis or sweats HENT: Negative for ear discharge or swelling Eyes: Negative for other worsening visual disturbances Respiratory: Negative for stridor or other swelling  Gastrointestinal: Negative for worsening distension or other blood Genitourinary: Negative for retention or other urinary change Musculoskeletal: Negative for other MSK pain or swelling Skin: Negative for color change or other new lesions Neurological: Negative for worsening tremors and other numbness  Psychiatric/Behavioral: Negative for worsening agitation or other fatigue All other system neg per pt    Objective:   Physical Exam BP 136/84   Pulse 71   Temp 98.1 F (36.7 C) (Oral)   Ht 5\' 3"  (1.6 m)   Wt 157 lb (71.2 kg)   SpO2 94%   BMI 27.81 kg/m  VS noted,  Constitutional: Pt appears in NAD HENT: Head: NCAT.  Right Ear: External ear normal.  Left Ear: External ear normal.  Eyes: . Pupils are equal,  round, and reactive to light. Conjunctivae and EOM are normal Nose: without d/c or deformity Neck: Neck supple. Gross normal ROM Cardiovascular: Normal rate and regular rhythm.   Pulmonary/Chest: Effort normal and breath sounds without rales or wheezing.  Abd:  Soft, NT, ND, + BS, no organomegaly Neurological: Pt is alert. At baseline orientation, motor grossly intact Skin: Skin is warm. No rashes, other new lesions, no LE edema Psychiatric: Pt behavior is normal without agitation  No other exam findings Lab Results  Component Value Date   WBC 10.5 07/25/2018   HGB 12.3 07/25/2018   HCT 37.4 07/25/2018   PLT 338.0 07/25/2018   GLUCOSE 101 (H) 07/30/2018   CHOL 128 03/26/2018   TRIG  82 03/26/2018   HDL 52 03/26/2018   LDLDIRECT 139.1 09/05/2009   LDLCALC 60 03/26/2018   ALT 6 03/26/2018   AST 10 03/26/2018   NA 141 07/30/2018   K 3.9 07/30/2018   CL 110 07/30/2018   CREATININE 1.72 (H) 07/30/2018   BUN 33 (H) 07/30/2018   CO2 23 07/30/2018   TSH 1.85 01/10/2017   INR 0.93 04/28/2018   HGBA1C 5.6 05/05/2018   MICROALBUR 1.0 01/10/2017        Assessment & Plan:

## 2018-07-30 NOTE — Assessment & Plan Note (Signed)
For f/u lab today, cont same tx for now

## 2018-07-30 NOTE — Patient Instructions (Signed)
Please take all new medication as prescribed - the potassium 10 meq at twice per day  Stop Tylenol and do not take in the future  OK to take the ibuprofen only very occasionally for more severe pain  Please continue all other medications as before, and refills have been done if requested.  Please have the pharmacy call with any other refills you may need.  Please continue your efforts at being more active, low cholesterol diet, and weight control.  Please keep your appointments with your specialists as you may have planned  Please go to the LAB in the Basement (turn left off the elevator) for the tests to be done today  You will be contacted by phone if any changes need to be made immediately.  Otherwise, you will receive a letter about your results with an explanation, but please check with MyChart first.  Please remember to sign up for MyChart if you have not done so, as this will be important to you in the future with finding out test results, communicating by private email, and scheduling acute appointments online when needed.

## 2018-07-30 NOTE — Assessment & Plan Note (Signed)
stable overall by history and exam, recent data reviewed with pt, and pt to continue medical treatment as before,  to f/u any worsening symptoms or concerns  

## 2018-07-30 NOTE — Assessment & Plan Note (Signed)
With hx of renal insufficiency, would be ok for only very occasional only ibuprofen, as she has little other options

## 2018-08-21 ENCOUNTER — Other Ambulatory Visit: Payer: Self-pay | Admitting: Orthopaedic Surgery

## 2018-09-05 ENCOUNTER — Telehealth: Payer: Self-pay | Admitting: *Deleted

## 2018-09-05 NOTE — Telephone Encounter (Signed)
[name] return call back she states the referral is needed for her husband Brooke [name]) who also is a pt of dr. Jenny Reichmann. She states she is having a knee replacement on march 10th, and she will not be able to give her husband a bath, nor get him up everyday. She is wanting a referral for home health services for husband [name]. Will send msg up under husband chart for documentation...Chryl Heck

## 2018-09-05 NOTE — Telephone Encounter (Signed)
MD received a letter from pt concerning her son. Mrs. Brooke [name] did not list son name or DOB. MD is needing information to proceed w/referral. Called Brooke [name] no answer LMOM RTC...Brooke [name]

## 2018-09-10 DIAGNOSIS — M1712 Unilateral primary osteoarthritis, left knee: Secondary | ICD-10-CM | POA: Diagnosis not present

## 2018-09-20 ENCOUNTER — Other Ambulatory Visit: Payer: Self-pay | Admitting: Internal Medicine

## 2018-09-23 NOTE — Progress Notes (Signed)
[facility] cardiology Dayna Dunn PA-C 03-26-18 epic   ekg 01-20-18 epic   Echo 01-22-18 epic   cxr 04-28-18 epic   Stress test 2018 epic

## 2018-09-23 NOTE — Patient Instructions (Signed)
Brooke [name]  09/23/2018   Your procedure is scheduled on: 09-30-2018   Report to [facility] Main  Entrance      Report to admitting at 7:30AM    Call this number if you have problems the morning of surgery [phone]      Remember: Do not eat food or drink liquids :After Midnight. BRUSH YOUR TEETH MORNING OF SURGERY AND RINSE YOUR MOUTH OUT, NO CHEWING GUM CANDY OR MINTS.     Take these medicines the morning of surgery with A SIP OF WATER: metoprolol, allopurinol, amlodipine, citalopram, rosuvastatin, clonazepam if needed, albuterol inhaler if needed    DO NOT TAKE ANY DIABETIC MEDICATIONS DAY OF YOUR SURGERY CHECK YOUR BLOOD SUGAR THE MORNING OF SURGERY. REPORT TO YOUR NURSE ON ARRIVAL                                  You may not have any metal on your body including hair pins and              piercings  Do not wear jewelry, make-up, lotions, powders or perfumes, deodorant             Do not wear nail polish.  Do not shave  48 hours prior to surgery.              Do not bring valuables to the hospital. [place].  Contacts, dentures or bridgework may not be worn into surgery.  Leave suitcase in the car. After surgery it may be brought to your room.                   Please read over the following fact sheets you were given: _____________________________________________________________________             [facility] - Preparing for Surgery Before surgery, you can play an important role.  Because skin is not sterile, your skin needs to be as free of germs as possible.  You can reduce the number of germs on your skin by washing with CHG (chlorahexidine gluconate) soap before surgery.  CHG is an antiseptic cleaner which kills germs and bonds with the skin to continue killing germs even after washing. Please DO NOT use if you have an allergy to CHG or antibacterial soaps.  If your skin becomes  reddened/irritated stop using the CHG and inform your nurse when you arrive at Short Stay. Do not shave (including legs and underarms) for at least 48 hours prior to the first CHG shower.  You may shave your face/neck. Please follow these instructions carefully:  1.  Shower with CHG Soap the night before surgery and the  morning of Surgery.  2.  If you choose to wash your hair, wash your hair first as usual with your  normal  shampoo.  3.  After you shampoo, rinse your hair and body thoroughly to remove the  shampoo.                           4.  Use CHG as you would any other liquid soap.  You can apply chg directly  to the skin and wash  Gently with a scrungie or clean washcloth.  5.  Apply the CHG Soap to your body ONLY FROM THE NECK DOWN.   Do not use on face/ open                           Wound or open sores. Avoid contact with eyes, ears mouth and genitals (private parts).                       Wash face,  Genitals (private parts) with your normal soap.             6.  Wash thoroughly, paying special attention to the area where your surgery  will be performed.  7.  Thoroughly rinse your body with warm water from the neck down.  8.  DO NOT shower/wash with your normal soap after using and rinsing off  the CHG Soap.                9.  Pat yourself dry with a clean towel.            10.  Wear clean pajamas.            11.  Place clean sheets on your bed the night of your first shower and do not  sleep with pets. Day of Surgery : Do not apply any lotions/deodorants the morning of surgery.  Please wear clean clothes to the hospital/surgery center.  FAILURE TO FOLLOW THESE INSTRUCTIONS MAY RESULT IN THE CANCELLATION OF YOUR SURGERY PATIENT SIGNATURE_________________________________  NURSE SIGNATURE__________________________________  ________________________________________________________________________   Brooke [name]  An incentive spirometer is a tool that  can help keep your lungs clear and active. This tool measures how well you are filling your lungs with each breath. Taking long deep breaths may help reverse or decrease the chance of developing breathing (pulmonary) problems (especially infection) following:  A long period of time when you are unable to move or be active. BEFORE THE PROCEDURE   If the spirometer includes an indicator to show your best effort, your nurse or respiratory therapist will set it to a desired goal.  If possible, sit up straight or lean slightly forward. Try not to slouch.  Hold the incentive spirometer in an upright position. INSTRUCTIONS FOR USE  1. Sit on the edge of your bed if possible, or sit up as far as you can in bed or on a chair. 2. Hold the incentive spirometer in an upright position. 3. Breathe out normally. 4. Place the mouthpiece in your mouth and seal your lips tightly around it. 5. Breathe in slowly and as deeply as possible, raising the piston or the ball toward the top of the column. 6. Hold your breath for 3-5 seconds or for as long as possible. Allow the piston or ball to fall to the bottom of the column. 7. Remove the mouthpiece from your mouth and breathe out normally. 8. Rest for a few seconds and repeat Steps 1 through 7 at least 10 times every 1-2 hours when you are awake. Take your time and take a few normal breaths between deep breaths. 9. The spirometer may include an indicator to show your best effort. Use the indicator as a goal to work toward during each repetition. 10. After each set of 10 deep breaths, practice coughing to be sure your lungs are clear. If you have an incision (the cut made at the time of surgery),  support your incision when coughing by placing a pillow or rolled up towels firmly against it. Once you are able to get out of bed, walk around indoors and cough well. You may stop using the incentive spirometer when instructed by your caregiver.  RISKS AND  COMPLICATIONS  Take your time so you do not get dizzy or light-headed.  If you are in pain, you may need to take or ask for pain medication before doing incentive spirometry. It is harder to take a deep breath if you are having pain. AFTER USE  Rest and breathe slowly and easily.  It can be helpful to keep track of a log of your progress. Your caregiver can provide you with a simple table to help with this. If you are using the spirometer at home, follow these instructions: [place] IF:   You are having difficultly using the spirometer.  You have trouble using the spirometer as often as instructed.  Your pain medication is not giving enough relief while using the spirometer.  You develop fever of 100.5 F (38.1 C) or higher. SEEK IMMEDIATE MEDICAL CARE IF:   You cough up bloody sputum that had not been present before.  You develop fever of 102 F (38.9 C) or greater.  You develop worsening pain at or near the incision site. MAKE SURE YOU:   Understand these instructions.  Will watch your condition.  Will get help right away if you are not doing well or get worse. Document Released: 11/19/2006 Document Revised: 10/01/2011 Document Reviewed: 01/20/2007 [facility] Patient Information 2014 [place], [place].   ________________________________________________________________________

## 2018-09-24 ENCOUNTER — Encounter (HOSPITAL_COMMUNITY): Payer: Self-pay

## 2018-09-24 ENCOUNTER — Other Ambulatory Visit: Payer: Self-pay | Admitting: Orthopaedic Surgery

## 2018-09-24 ENCOUNTER — Other Ambulatory Visit: Payer: Self-pay

## 2018-09-24 ENCOUNTER — Encounter (HOSPITAL_COMMUNITY)
Admission: RE | Admit: 2018-09-24 | Discharge: 2018-09-24 | Disposition: A | Discharge status: Home / Self Care | Payer: Medicare Other | Source: Ambulatory Visit | Attending: Orthopaedic Surgery | Admitting: Orthopaedic Surgery

## 2018-09-24 DIAGNOSIS — Z01812 Encounter for preprocedural laboratory examination: Secondary | ICD-10-CM | POA: Diagnosis not present

## 2018-09-24 LAB — CBC WITH DIFFERENTIAL/PLATELET
Abs Immature Granulocytes: 0.08 10*3/uL — ABNORMAL HIGH (ref 0.00–0.07)
Basophils Absolute: 0 10*3/uL (ref 0.0–0.1)
Basophils Relative: 0 %
EOS ABS: 0 10*3/uL (ref 0.0–0.5)
Eosinophils Relative: 0 %
HCT: 38 % (ref 36.0–46.0)
Hemoglobin: 11.8 g/dL — ABNORMAL LOW (ref 12.0–15.0)
Immature Granulocytes: 1 %
Lymphocytes Relative: 24 %
Lymphs Abs: 1.7 10*3/uL (ref 0.7–4.0)
MCH: 26.6 pg (ref 26.0–34.0)
MCHC: 31.1 g/dL (ref 30.0–36.0)
MCV: 85.8 fL (ref 80.0–100.0)
Monocytes Absolute: 0.7 10*3/uL (ref 0.1–1.0)
Monocytes Relative: 9 %
Neutro Abs: 4.5 10*3/uL (ref 1.7–7.7)
Neutrophils Relative %: 66 %
Platelets: 255 10*3/uL (ref 150–400)
RBC: 4.43 MIL/uL (ref 3.87–5.11)
RDW: 15.7 % — ABNORMAL HIGH (ref 11.5–15.5)
WBC: 7 10*3/uL (ref 4.0–10.5)
nRBC: 0 % (ref 0.0–0.2)

## 2018-09-24 LAB — URINALYSIS, ROUTINE W REFLEX MICROSCOPIC
BILIRUBIN URINE: NEGATIVE
Glucose, UA: NEGATIVE mg/dL
Ketones, ur: NEGATIVE mg/dL
Leukocytes,Ua: NEGATIVE
Nitrite: POSITIVE — AB
Protein, ur: 100 mg/dL — AB
Specific Gravity, Urine: 1.018 (ref 1.005–1.030)
pH: 6 (ref 5.0–8.0)

## 2018-09-24 LAB — BASIC METABOLIC PANEL
Anion gap: 8 (ref 5–15)
BUN: 32 mg/dL — ABNORMAL HIGH (ref 8–23)
CO2: 21 mmol/L — ABNORMAL LOW (ref 22–32)
Calcium: 9.4 mg/dL (ref 8.9–10.3)
Chloride: 109 mmol/L (ref 98–111)
Creatinine, Ser: 1.57 mg/dL — ABNORMAL HIGH (ref 0.44–1.00)
GFR calc Af Amer: 39 mL/min — ABNORMAL LOW (ref >60)
GFR calc non Af Amer: 34 mL/min — ABNORMAL LOW (ref >60)
Glucose, Bld: 94 mg/dL (ref 70–99)
POTASSIUM: 3.4 mmol/L — AB (ref 3.5–5.1)
Sodium: 138 mmol/L (ref 135–145)

## 2018-09-24 LAB — PROTIME-INR
INR: 1 (ref 0.8–1.2)
Prothrombin Time: 13 seconds (ref 11.4–15.2)

## 2018-09-24 LAB — APTT: aPTT: 30 seconds (ref 24–36)

## 2018-09-24 LAB — HEMOGLOBIN A1C
Hgb A1c MFr Bld: 5.8 % — ABNORMAL HIGH (ref 4.8–5.6)
Mean Plasma Glucose: 119.76 mg/dL

## 2018-09-24 LAB — GLUCOSE, CAPILLARY: Glucose-Capillary: 95 mg/dL (ref 70–99)

## 2018-09-24 LAB — SURGICAL PCR SCREEN
MRSA, PCR: NEGATIVE
Staphylococcus aureus: NEGATIVE

## 2018-09-25 NOTE — H&P (Signed)
TOTAL KNEE ADMISSION H&P  Patient is being admitted for left total knee arthroplasty.  Subjective:  Chief Complaint:left knee pain.  HPI: Brooke [name], 68 y.o. female, has a history of pain and functional disability in the left knee due to arthritis and has failed non-surgical conservative treatments for greater than 12 weeks to includeNSAID's and/or analgesics, corticosteriod injections, flexibility and strengthening excercises, supervised PT with diminished ADL's post treatment, use of assistive devices, weight reduction as appropriate and activity modification.  Onset of symptoms was gradual, starting 5 years ago with gradually worsening course since that time. The patient noted no past surgery on the left knee(s).  Patient currently rates pain in the left knee(s) at 10 out of 10 with activity. Patient has night pain, worsening of pain with activity and weight bearing, pain that interferes with activities of daily living, crepitus and joint swelling.  Patient has evidence of subchondral cysts, subchondral sclerosis, periarticular osteophytes and joint space narrowing by imaging studies. There is no active infection.  Patient Active Problem List   Diagnosis Date Noted  . CKD (chronic kidney disease) stage 3, GFR 30-59 ml/min (HCC) 07/25/2018  . Chronic back pain 07/25/2018  . Primary osteoarthritis of right knee 05/05/2018  . Osteoarthritis of right knee 04/30/2018  . Degenerative arthritis of knee, bilateral 08/07/2017  . Depression with anxiety 07/19/2017  . Vertigo 01/12/2017  . Low back pain 01/12/2017  . SOB (shortness of breath) 12/02/2016  . Dysuria 09/12/2016  . Bilateral knee pain 02/15/2015  . Effusion of right knee 01/28/2015  . Asthma with exacerbation 11/09/2014  . Pronation deformity of both feet 09/29/2014  . Greater trochanteric bursitis of left hip 08/31/2014  . Degenerative arthritis of left knee 05/18/2014  . Ventricular tachycardia, polymorphic ([place]) 01/04/2014  .  Abnormal MRI of head 12/04/2013  . Syncope 11/06/2013  . Chest pain 11/06/2013  . Headache(784.0) 11/06/2013  . Nausea with vomiting 11/06/2013  . Cough 08/28/2013  . Hypokalemia 08/18/2012  . Diastolic dysfunction 05/39/7673  . Leukocytosis 01/03/2011  . Toe pain 12/20/2010  . Preventative health care 12/19/2010  . FATIGUE 09/05/2009  . Diabetes ([place]) 11/29/2008  . Gout 08/09/2008  . Normochromic normocytic anemia 02/09/2008  . GERD 02/09/2008  . ALOPECIA 12/30/2007  . Hyperlipidemia 10/01/2007  . Essential hypertension 02/26/2007  . COLONIC POLYPS, HX OF 02/26/2007  . GOITER NOS 09/19/2006  . Asthma 09/19/2006  . ECZEMA, ATOPIC DERMATITIS 09/19/2006  . Insomnia 09/19/2006   Past Medical History:  Diagnosis Date  . Alopecia   . Aortic atherosclerosis ([place])   . Asthma    FOLLOWED BY PCP  . Eczema   . GERD (gastroesophageal reflux disease)   . Gout    04-28-2018--- per pt stable , as been a while since last episode  . History of colon polyps   . History of syncope 2015  . Hyperlipidemia   . Hypertension   . Hypothyroidism   . OA (osteoarthritis) of knee    bilateral  . PVC's (premature ventricular contractions)   . Toxic multinodular goiter    03/ 2003  s/p RAI  . Type 2 diabetes mellitus ([place])    followed by pcp  . Ventricular tachycardia, polymorphic ([place]) 01/04/2014   primary cardiologist-- dr Tressia Miners turner (hx monitor 2015 showed couplet PVCs, as trigger)  . Wears glasses   . Wears partial dentures    upper    Past Surgical History:  Procedure Laterality Date  . ABDOMINAL HYSTERECTOMY  10/22/1999   WITH BSO  .  CATARACT EXTRACTION W/ INTRAOCULAR LENS IMPLANT Left YRS AGO  . EXCISION ABDOMINAL WALL MASS  12-20-2005   dr Margot Chimes @[facility]    neruofibroma  . LAPAROSCOPIC CHOLECYSTECTOMY  10/01/2002   dr Margot Chimes @[facility]   . LEFT HEART CATHETERIZATION WITH CORONARY ANGIOGRAM N/A 01/07/2014   Procedure: LEFT HEART CATHETERIZATION WITH CORONARY ANGIOGRAM;  Surgeon: Peter M  Martinique, MD;  Location: [facility] CATH LAB;  Service: Cardiovascular;  Laterality: N/A;  . RASTELLI PROCEDURE  6/98 neg  . RENAL ARTERY STENT Left 11/2003   angioplasty and stenting  . RENAL ARTERY STENT Left 02/2005   re-stenting  . TOTAL KNEE ARTHROPLASTY Right 05/05/2018   Procedure: RIGHT TOTAL KNEE ARTHROPLASTY;  Surgeon: Frederik Pear, MD;  Location: [facility];  Service: Orthopedics;  Laterality: Right;    No current facility-administered medications for this encounter.    Current Outpatient Medications  Medication Sig Dispense Refill Last Dose  . albuterol (PROVENTIL HFA;VENTOLIN HFA) 108 (90 Base) MCG/ACT inhaler Inhale 2 puffs into the lungs every 6 (six) hours as needed for wheezing. 1 Inhaler 11 Taking  . allopurinol (ZYLOPRIM) 100 MG tablet TAKE 1 TABLET BY MOUTH EVERY DAY (Patient taking differently: Take 100 mg by mouth daily. ) 90 tablet 3 Taking  . aspirin EC 81 MG tablet Take 1 tablet (81 mg total) by mouth 2 (two) times daily. (Patient taking differently: Take 81 mg by mouth 2 (two) times a week. ) 60 tablet 0 Taking  . citalopram (CELEXA) 20 MG tablet Take 1 tablet (20 mg total) by mouth daily. 90 tablet 3 Taking  . clonazePAM (KLONOPIN) 0.5 MG tablet Take 1 tablet (0.5 mg total) by mouth 2 (two) times daily as needed (vertigo). 30 tablet 2 Taking  . fluticasone (FLOVENT HFA) 110 MCG/ACT inhaler Inhale 2 puffs into the lungs 2 (two) times daily. 12 g 12 Taking  . gabapentin (NEURONTIN) 100 MG capsule Take 1 capsule (100 mg total) by mouth at bedtime. 90 capsule 1 Taking  . hydrochlorothiazide (HYDRODIURIL) 25 MG tablet TAKE 1 TABLET BY MOUTH EVERY DAY (Patient taking differently: Take 25 mg by mouth daily. ) 90 tablet 3 Taking  . ibuprofen (ADVIL,MOTRIN) 800 MG tablet 1 tab by mouth daily as needed for pain (Patient taking differently: Take 800 mg by mouth daily as needed for moderate pain. ) 30 tablet 2   . meclizine (ANTIVERT) 25 MG tablet Take 1 tablet (25 mg total) by mouth 3 (three)  times daily as needed for dizziness. 30 tablet 5 Taking  . metFORMIN (GLUCOPHAGE-XR) 500 MG 24 hr tablet TAKE ONE TABLET BY MOUTH ONCE DAILY WITH  BREAKFAST (Patient taking differently: Take 500 mg by mouth daily with breakfast. TAKE ONE TABLET BY MOUTH ONCE DAILY WITH  BREAKFAST) 90 tablet 3 Taking  . metoprolol succinate (TOPROL-XL) 100 MG 24 hr tablet Take 100 mg by mouth 2 (two) times daily.     Marland Kitchen omeprazole (PRILOSEC) 20 MG capsule Take 1 capsule (20 mg total) by mouth 2 (two) times daily. 60 capsule 11 Taking  . potassium chloride (KLOR-CON 10) 10 MEQ tablet Take 1 tablet (10 mEq total) by mouth 2 (two) times daily. 60 tablet 11   . rosuvastatin (CRESTOR) 20 MG tablet Take 1 tablet (20 mg total) by mouth daily. 90 tablet 3 Taking  . zolpidem (AMBIEN) 10 MG tablet Take 1 tablet (10 mg total) by mouth at bedtime as needed for sleep. (Patient taking differently: Take 10 mg by mouth at bedtime. ) 90 tablet 1 Taking  .  AMBULATORY NON FORMULARY MEDICATION Medication Name: Rolling walker 1 Device 0 Taking  . amLODipine (NORVASC) 10 MG tablet TAKE 1 TABLET BY MOUTH EVERY DAY 90 tablet 3    Allergies  Allergen Reactions  . Ace Inhibitors Swelling       . Codeine Nausea And Vomiting and Rash  . Hydrocodone Nausea And Vomiting  . Tramadol Nausea And Vomiting  . Tylenol [Acetaminophen] Itching and Rash    Social History   Tobacco Use  . Smoking status: Never Smoker  . Smokeless tobacco: Never Used  Substance Use Topics  . Alcohol use: No    Family History  Problem Relation Age of Onset  . Diabetes Mother   . Heart disease Father      Review of Systems  Musculoskeletal: Positive for joint pain.       Left knee  All other systems reviewed and are negative.   Objective:  Physical Exam  Constitutional: She is oriented to person, place, and time. She appears well-developed and well-nourished.  HENT:  Head: Normocephalic and atraumatic.  Eyes: Pupils are equal, round, and reactive  to light.  Neck: Normal range of motion.  Cardiovascular: Normal rate and regular rhythm.  Respiratory: Effort normal.  Musculoskeletal:     Comments: Left knee has range of motion from 0-110.  She has a fairly significant valgus deformity.  I do not feel an effusion.  She has crepitation and lateral greater than medial joint line pain.  Hip motion is full and straight leg raise is negative.  The opposite replaced knee has a benign-appearing incision with motion from about 5-95.    Neurological: She is alert and oriented to person, place, and time.  Skin: Skin is warm and dry.  Psychiatric: She has a normal mood and affect. Her behavior is normal. Judgment and thought content normal.    Vital signs in last 24 hours:    Labs:   Estimated body mass index is 27.63 kg/m as calculated from the following:   Height as of 09/24/18: 5\' 3"  (1.6 m).   Weight as of 09/24/18: 70.8 kg.   Imaging Review Plain radiographs demonstrate severe degenerative joint disease of the left knee(s). The overall alignment issignificant valgus. The bone quality appears to be good for age and reported activity level.      Assessment/Plan:  End stage primary arthritis, left knee   The patient history, physical examination, clinical judgment of the provider and imaging studies are consistent with end stage degenerative joint disease of the left knee(s) and total knee arthroplasty is deemed medically necessary. The treatment options including medical management, injection therapy arthroscopy and arthroplasty were discussed at length. The risks and benefits of total knee arthroplasty were presented and reviewed. The risks due to aseptic loosening, infection, stiffness, patella tracking problems, thromboembolic complications and other imponderables were discussed. The patient acknowledged the explanation, agreed to proceed with the plan and consent was signed. Patient is being admitted for inpatient treatment for  surgery, pain control, PT, OT, prophylactic antibiotics, VTE prophylaxis, progressive ambulation and ADL's and discharge planning. The patient is planning to be discharged home with home health services  Patient's anticipated LOS is less than 2 midnights, meeting these requirements: - Younger than 56 - Lives within 1 hour of care - Has a competent adult at home to recover with post-op recover - NO history of  - Chronic pain requiring opiods  - Diabetes  - Coronary Artery Disease  - Heart failure  - Heart attack  -  Stroke  - DVT/VTE  - Cardiac arrhythmia  - Respiratory Failure/COPD  - Renal failure  - Anemia  - Advanced Liver disease

## 2018-09-29 MED ORDER — TRANEXAMIC ACID 1000 MG/10ML IV SOLN
2000.0000 mg | INTRAVENOUS | Status: DC
  Filled 2018-09-29: qty 20

## 2018-09-29 MED ORDER — BUPIVACAINE LIPOSOME 1.3 % IJ SUSP
20.0000 mL | Freq: Once | INTRAMUSCULAR | Status: DC
  Filled 2018-09-29: qty 20

## 2018-09-29 NOTE — Progress Notes (Signed)
Anesthesia Chart Review   Case:  [id number] Date/Time:  09/30/18 0945   Procedure:  TOTAL KNEE ARTHROPLASTY (Left )   Anesthesia type:  Spinal   Pre-op diagnosis:  LEFT KNEE OSTEOARTHRITIS   Location:  [facility] ROOM 05 / [facility]   Surgeon:  Melrose Nakayama, MD      DISCUSSION: 69 yo never smoker with h/o HTN, PVCs (episode of polymorphic VT 2015 thought to be triggered by PVCs), asthma, hypothyroidism, CKD Stage III (creatinine stable, followed by PCP), GERD, HLD, DM II, left knee OA scheduled for above procedure 09/30/18 with Dr. Melrose Nakayama.   Underwent right TKA 05/05/18, anesthesia records reviewed, no complications documented.  Pt previous cleared by cardiology prior to this procedure.  No changes, stable since last visit.  Per clearance dated 03/26/18, "Given her reassuring cardiac workup and resolution of symptoms, she would be cleared to undergo this from our perspective without further CV testing. Given history of arrhythmia it would be essential to continue perioperative beta blocker and perioperative telemetry monitoring."  Pt last seen by PCP, Dr. [name], on 07/30/18.  Stable at this visit.    Pt can proceed with planned procedure barring acute status change.  VS: BP (!) 143/70   Pulse 62   Temp 36.9 C (Oral)   Resp 16   Ht 5\' 3"  (1.6 m)   Wt 70.8 kg   SpO2 100%   BMI 27.63 kg/m   PROVIDERS: Biagio Borg, MD is PCP    LABS: Labs reviewed: Acceptable for surgery. (all labs ordered are listed, but only abnormal results are displayed)  Labs Reviewed  HEMOGLOBIN A1C - Abnormal; Notable for the following components:      Result Value   Hgb A1c MFr Bld 5.8 (*)    All other components within normal limits  BASIC METABOLIC PANEL - Abnormal; Notable for the following components:   Potassium 3.4 (*)    CO2 21 (*)    BUN 32 (*)    Creatinine, Ser 1.57 (*)    GFR calc non Af Amer 34 (*)    GFR calc Af Amer 39 (*)    All other components within normal limits  CBC WITH  DIFFERENTIAL/PLATELET - Abnormal; Notable for the following components:   Hemoglobin 11.8 (*)    RDW 15.7 (*)    Abs Immature Granulocytes 0.08 (*)    All other components within normal limits  URINALYSIS, ROUTINE W REFLEX MICROSCOPIC - Abnormal; Notable for the following components:   Hgb urine dipstick SMALL (*)    Protein, ur 100 (*)    Nitrite POSITIVE (*)    Bacteria, UA MANY (*)    All other components within normal limits  SURGICAL PCR SCREEN  GLUCOSE, CAPILLARY  APTT  PROTIME-INR  TYPE AND SCREEN     IMAGES: Chest Xray 04/28/2018 FINDINGS: The heart size and mediastinal contours are within normal limits. Both lungs are clear. No pleural effusion or pneumothorax. The visualized skeletal structures are unremarkable.  IMPRESSION: No active cardiopulmonary disease.  Carotid Doppler 11/18/2013 Normal carotid arteries, bilaterally Patent vertebral arteries with antegrade flow Normal subclavian arteries, bilaterally  Incidental finding of a sonolucent, vascular structure in the bilateral lobe of the thyroid.  Consider a dedicated thyroid ultrasound  EKG: 01/19/18 Rate 64 bpm Sinus rhythm  LVH with IVCD, LAD and secondary repol abnrm Baseline wander in leads V6  CV: Echo 01/22/18 Study Conclusions  - Left ventricle: The cavity size was normal. Wall thickness was  increased in a pattern of moderate LVH. Systolic function was   normal. The estimated ejection fraction was in the range of 60%   to 65%. Wall motion was normal; there were no regional wall   motion abnormalities. Doppler parameters are consistent with   abnormal left ventricular relaxation (grade 1 diastolic   dysfunction). The E/e&' ratio is between 8-15, suggesting   indeterminate LV filling pressure. - Mitral valve: Mildly thickened leaflets . There was trivial   regurgitation. - Left atrium: The atrium was normal in size. - Tricuspid valve: There was trivial regurgitation. - Pulmonary  arteries: PA peak pressure: 18 mm Hg (S). - Inferior vena cava: The vessel was normal in size. The   respirophasic diameter changes were in the normal range (= 50%),   consistent with normal central venous pressure.  Impressions:  - Compared to a prior study in 2016, there are no significant   changes.  Stress Test 12/28/2016  Nuclear stress EF: 62%.  There was no ST segment deviation noted during stress.  This is a low risk study.  The left ventricular ejection fraction is normal (55-65%).  There is a small moderate in intensity anteroapical defect that it fixed and consistent with apical thinning. Past Medical History:  Diagnosis Date  . Alopecia   . Aortic atherosclerosis ([place])   . Asthma    FOLLOWED BY PCP  . Eczema   . GERD (gastroesophageal reflux disease)   . Gout    04-28-2018--- per pt stable , as been a while since last episode  . History of colon polyps   . History of syncope 2015  . Hyperlipidemia   . Hypertension   . Hypothyroidism   . OA (osteoarthritis) of knee    bilateral  . PVC's (premature ventricular contractions)   . Toxic multinodular goiter    03/ 2003  s/p RAI  . Type 2 diabetes mellitus ([place])    followed by pcp  . Ventricular tachycardia, polymorphic ([place]) 01/04/2014   primary cardiologist-- dr Tressia Miners turner (hx monitor 2015 showed couplet PVCs, as trigger)  . Wears glasses   . Wears partial dentures    upper    Past Surgical History:  Procedure Laterality Date  . ABDOMINAL HYSTERECTOMY  10/22/1999   WITH BSO  . CATARACT EXTRACTION W/ INTRAOCULAR LENS IMPLANT Left YRS AGO  . EXCISION ABDOMINAL WALL MASS  12-20-2005   dr Margot Chimes @[facility]    neruofibroma  . LAPAROSCOPIC CHOLECYSTECTOMY  10/01/2002   dr Margot Chimes @[facility]   . LEFT HEART CATHETERIZATION WITH CORONARY ANGIOGRAM N/A 01/07/2014   Procedure: LEFT HEART CATHETERIZATION WITH CORONARY ANGIOGRAM;  Surgeon: Peter M Martinique, MD;  Location: [facility] CATH LAB;  Service: Cardiovascular;  Laterality: N/A;   . RASTELLI PROCEDURE  6/98 neg  . RENAL ARTERY STENT Left 11/2003   angioplasty and stenting  . RENAL ARTERY STENT Left 02/2005   re-stenting  . TOTAL KNEE ARTHROPLASTY Right 05/05/2018   Procedure: RIGHT TOTAL KNEE ARTHROPLASTY;  Surgeon: Frederik Pear, MD;  Location: [facility];  Service: Orthopedics;  Laterality: Right;    MEDICATIONS: . albuterol (PROVENTIL HFA;VENTOLIN HFA) 108 (90 Base) MCG/ACT inhaler  . allopurinol (ZYLOPRIM) 100 MG tablet  . AMBULATORY NON FORMULARY MEDICATION  . amLODipine (NORVASC) 10 MG tablet  . aspirin EC 81 MG tablet  . citalopram (CELEXA) 20 MG tablet  . clonazePAM (KLONOPIN) 0.5 MG tablet  . fluticasone (FLOVENT HFA) 110 MCG/ACT inhaler  . gabapentin (NEURONTIN) 100 MG capsule  . hydrochlorothiazide (HYDRODIURIL) 25 MG tablet  .  ibuprofen (ADVIL,MOTRIN) 800 MG tablet  . meclizine (ANTIVERT) 25 MG tablet  . metFORMIN (GLUCOPHAGE-XR) 500 MG 24 hr tablet  . metoprolol succinate (TOPROL-XL) 100 MG 24 hr tablet  . omeprazole (PRILOSEC) 20 MG capsule  . potassium chloride (KLOR-CON 10) 10 MEQ tablet  . rosuvastatin (CRESTOR) 20 MG tablet  . zolpidem (AMBIEN) 10 MG tablet   No current facility-administered medications for this encounter.    Derrill Memo ON 09/30/2018] bupivacaine liposome (EXPAREL) 1.3 % injection 266 mg  . [START ON 09/30/2018] tranexamic acid (CYKLOKAPRON) 2,000 mg in sodium chloride 0.9 % 50 mL Topical Application     Maia Plan [facility] Pre-Surgical Testing [phone] 09/29/18 9:53 AM

## 2018-09-29 NOTE — Anesthesia Preprocedure Evaluation (Addendum)
Anesthesia Evaluation  Patient identified by MRN, date of birth, ID band Patient awake    Reviewed: Allergy & Precautions, NPO status , Patient's Chart, lab work & pertinent test results, reviewed documented beta blocker date and time   History of Anesthesia Complications Negative for: history of anesthetic complications  Airway Mallampati: II  TM Distance: >3 FB Neck ROM: Full    Dental  (+) Dental Advisory Given, Partial Upper   Pulmonary asthma ,    breath sounds clear to auscultation       Cardiovascular hypertension, Pt. on medications and Pt. on home beta blockers + dysrhythmias Ventricular Tachycardia  Rhythm:Regular Rate:Normal   '19 TTE - moderate LVH. EF 60% to 65%. Grade 1 diastolic dysfunction. Trivial MR and TR.   '18 Nuclear Stress - Nuclear stress EF: 62%. There was no ST segment deviation noted during stress. This is a low risk study. The left ventricular ejection fraction is normal (55-65%). There is a small moderate in intensity anteroapical defect that it fixed and consistent with apical thinning.   Neuro/Psych  Headaches, PSYCHIATRIC DISORDERS Anxiety Depression    GI/Hepatic Neg liver ROS, GERD  Medicated and Controlled,  Endo/Other  diabetes, Type 2, Oral Hypoglycemic AgentsHypothyroidism   Renal/GU CRFRenal disease     Musculoskeletal  (+) Arthritis , Osteoarthritis,   Gout    Abdominal   Peds  Hematology negative hematology ROS (+)   Anesthesia Other Findings   Reproductive/Obstetrics                           Anesthesia Physical Anesthesia Plan  ASA: III  Anesthesia Plan: Spinal   Post-op Pain Management:  Regional for Post-op pain   Induction:   PONV Risk Score and Plan: 2 and Treatment may vary due to age or medical condition, Propofol infusion and Ondansetron  Airway Management Planned: Natural Airway and Simple Face Mask  Additional Equipment:  None  Intra-op Plan:   Post-operative Plan:   Informed Consent: I have reviewed the patients History and Physical, chart, labs and discussed the procedure including the risks, benefits and alternatives for the proposed anesthesia with the patient or authorized representative who has indicated his/her understanding and acceptance.       Plan Discussed with: CRNA and Anesthesiologist  Anesthesia Plan Comments: (Labs reviewed, platelets acceptable. Discussed risks and benefits of spinal, including spinal/epidural hematoma, infection, failed block, and PDPH. Patient expressed understanding and wished to proceed. )     Anesthesia Quick Evaluation

## 2018-09-30 ENCOUNTER — Encounter (HOSPITAL_COMMUNITY)
Admission: RE | Disposition: A | Discharge status: Home / Self Care | Payer: Self-pay | Source: Home / Self Care | Attending: Orthopaedic Surgery

## 2018-09-30 ENCOUNTER — Ambulatory Visit (HOSPITAL_COMMUNITY): Payer: Medicare Other | Admitting: Physician Assistant

## 2018-09-30 ENCOUNTER — Ambulatory Visit (HOSPITAL_COMMUNITY): Payer: Medicare Other | Admitting: Registered Nurse

## 2018-09-30 ENCOUNTER — Other Ambulatory Visit: Payer: Self-pay

## 2018-09-30 ENCOUNTER — Encounter (HOSPITAL_COMMUNITY): Payer: Self-pay | Admitting: Registered Nurse

## 2018-09-30 ENCOUNTER — Observation Stay (HOSPITAL_COMMUNITY)
Admission: RE | Admit: 2018-09-30 | Discharge: 2018-10-02 | Disposition: A | Discharge status: Home / Self Care | Payer: Medicare Other | Attending: Orthopaedic Surgery | Admitting: Orthopaedic Surgery

## 2018-09-30 DIAGNOSIS — E785 Hyperlipidemia, unspecified: Secondary | ICD-10-CM | POA: Insufficient documentation

## 2018-09-30 DIAGNOSIS — K219 Gastro-esophageal reflux disease without esophagitis: Secondary | ICD-10-CM | POA: Insufficient documentation

## 2018-09-30 DIAGNOSIS — R2681 Unsteadiness on feet: Secondary | ICD-10-CM | POA: Diagnosis not present

## 2018-09-30 DIAGNOSIS — J45909 Unspecified asthma, uncomplicated: Secondary | ICD-10-CM | POA: Insufficient documentation

## 2018-09-30 DIAGNOSIS — M1712 Unilateral primary osteoarthritis, left knee: Secondary | ICD-10-CM | POA: Diagnosis not present

## 2018-09-30 DIAGNOSIS — Z7982 Long term (current) use of aspirin: Secondary | ICD-10-CM | POA: Insufficient documentation

## 2018-09-30 DIAGNOSIS — M109 Gout, unspecified: Secondary | ICD-10-CM | POA: Diagnosis not present

## 2018-09-30 DIAGNOSIS — E0520 Thyrotoxicosis with toxic multinodular goiter without thyrotoxic crisis or storm: Secondary | ICD-10-CM | POA: Insufficient documentation

## 2018-09-30 DIAGNOSIS — Z833 Family history of diabetes mellitus: Secondary | ICD-10-CM | POA: Insufficient documentation

## 2018-09-30 DIAGNOSIS — F419 Anxiety disorder, unspecified: Secondary | ICD-10-CM | POA: Diagnosis not present

## 2018-09-30 DIAGNOSIS — Z886 Allergy status to analgesic agent status: Secondary | ICD-10-CM | POA: Insufficient documentation

## 2018-09-30 DIAGNOSIS — G8918 Other acute postprocedural pain: Secondary | ICD-10-CM | POA: Diagnosis not present

## 2018-09-30 DIAGNOSIS — Z96651 Presence of right artificial knee joint: Secondary | ICD-10-CM | POA: Diagnosis not present

## 2018-09-30 DIAGNOSIS — Z7984 Long term (current) use of oral hypoglycemic drugs: Secondary | ICD-10-CM | POA: Diagnosis not present

## 2018-09-30 DIAGNOSIS — E039 Hypothyroidism, unspecified: Secondary | ICD-10-CM | POA: Diagnosis not present

## 2018-09-30 DIAGNOSIS — I1310 Hypertensive heart and chronic kidney disease without heart failure, with stage 1 through stage 4 chronic kidney disease, or unspecified chronic kidney disease: Secondary | ICD-10-CM | POA: Diagnosis not present

## 2018-09-30 DIAGNOSIS — I129 Hypertensive chronic kidney disease with stage 1 through stage 4 chronic kidney disease, or unspecified chronic kidney disease: Secondary | ICD-10-CM | POA: Diagnosis not present

## 2018-09-30 DIAGNOSIS — F329 Major depressive disorder, single episode, unspecified: Secondary | ICD-10-CM | POA: Insufficient documentation

## 2018-09-30 DIAGNOSIS — E1122 Type 2 diabetes mellitus with diabetic chronic kidney disease: Secondary | ICD-10-CM | POA: Insufficient documentation

## 2018-09-30 DIAGNOSIS — Z885 Allergy status to narcotic agent status: Secondary | ICD-10-CM | POA: Insufficient documentation

## 2018-09-30 DIAGNOSIS — Z888 Allergy status to other drugs, medicaments and biological substances status: Secondary | ICD-10-CM | POA: Diagnosis not present

## 2018-09-30 DIAGNOSIS — Z8249 Family history of ischemic heart disease and other diseases of the circulatory system: Secondary | ICD-10-CM | POA: Insufficient documentation

## 2018-09-30 DIAGNOSIS — Z79899 Other long term (current) drug therapy: Secondary | ICD-10-CM | POA: Diagnosis not present

## 2018-09-30 DIAGNOSIS — Z791 Long term (current) use of non-steroidal anti-inflammatories (NSAID): Secondary | ICD-10-CM | POA: Insufficient documentation

## 2018-09-30 DIAGNOSIS — N183 Chronic kidney disease, stage 3 (moderate): Secondary | ICD-10-CM | POA: Diagnosis not present

## 2018-09-30 HISTORY — PX: TOTAL KNEE ARTHROPLASTY: SHX125

## 2018-09-30 LAB — TYPE AND SCREEN
ABO/RH(D): O POS
Antibody Screen: NEGATIVE

## 2018-09-30 LAB — GLUCOSE, CAPILLARY
GLUCOSE-CAPILLARY: 98 mg/dL (ref 70–99)
Glucose-Capillary: 103 mg/dL — ABNORMAL HIGH (ref 70–99)
Glucose-Capillary: 175 mg/dL — ABNORMAL HIGH (ref 70–99)
Glucose-Capillary: 137 mg/dL — ABNORMAL HIGH (ref 70–99)

## 2018-09-30 SURGERY — ARTHROPLASTY, KNEE, TOTAL
Anesthesia: Spinal | Site: Knee | Laterality: Left

## 2018-09-30 MED ORDER — EPHEDRINE SULFATE-NACL 50-0.9 MG/10ML-% IV SOSY
PREFILLED_SYRINGE | INTRAVENOUS | Status: DC | PRN
  Administered 2018-09-30: 15 mg via INTRAVENOUS

## 2018-09-30 MED ORDER — ASPIRIN EC 325 MG PO TBEC
325.0000 mg | DELAYED_RELEASE_TABLET | Freq: Two times a day (BID) | ORAL | Status: DC
  Administered 2018-10-01 – 2018-10-02 (×3): 325 mg via ORAL
  Filled 2018-09-30 (×3): qty 1

## 2018-09-30 MED ORDER — FENTANYL CITRATE (PF) 100 MCG/2ML IJ SOLN: 25.0000 ug | INTRAMUSCULAR | Status: DC | PRN

## 2018-09-30 MED ORDER — METHOCARBAMOL 500 MG PO TABS
500.0000 mg | ORAL_TABLET | Freq: Four times a day (QID) | ORAL | Status: DC | PRN
  Administered 2018-09-30 – 2018-10-02 (×4): 500 mg via ORAL
  Filled 2018-09-30 (×5): qty 1

## 2018-09-30 MED ORDER — OXYCODONE HCL 5 MG PO TABS
5.0000 mg | ORAL_TABLET | ORAL | Status: DC | PRN
  Administered 2018-09-30 – 2018-10-02 (×7): 5 mg via ORAL
  Filled 2018-09-30 (×2): qty 2
  Filled 2018-09-30 (×5): qty 1

## 2018-09-30 MED ORDER — TRANEXAMIC ACID-NACL 1000-0.7 MG/100ML-% IV SOLN
1000.0000 mg | Freq: Once | INTRAVENOUS | Status: AC
  Administered 2018-09-30: 1000 mg via INTRAVENOUS
  Filled 2018-09-30: qty 100

## 2018-09-30 MED ORDER — BISACODYL 5 MG PO TBEC: 5.0000 mg | DELAYED_RELEASE_TABLET | Freq: Every day | ORAL | Status: DC | PRN

## 2018-09-30 MED ORDER — 0.9 % SODIUM CHLORIDE (POUR BTL) OPTIME
TOPICAL | Status: DC | PRN
  Administered 2018-09-30: 1000 mL

## 2018-09-30 MED ORDER — BUPIVACAINE-EPINEPHRINE (PF) 0.25% -1:200000 IJ SOLN
INTRAMUSCULAR | Status: DC | PRN
  Administered 2018-09-30: 30 mL

## 2018-09-30 MED ORDER — PROPOFOL 10 MG/ML IV BOLUS
INTRAVENOUS | Status: DC | PRN
  Administered 2018-09-30: 20 mg via INTRAVENOUS

## 2018-09-30 MED ORDER — METOCLOPRAMIDE HCL 5 MG/ML IJ SOLN: 5.0000 mg | Freq: Three times a day (TID) | INTRAMUSCULAR | Status: DC | PRN

## 2018-09-30 MED ORDER — EPHEDRINE 5 MG/ML INJ
INTRAVENOUS | Status: AC
  Filled 2018-09-30: qty 10

## 2018-09-30 MED ORDER — ONDANSETRON HCL 4 MG/2ML IJ SOLN
INTRAMUSCULAR | Status: DC | PRN
  Administered 2018-09-30: 4 mg via INTRAVENOUS

## 2018-09-30 MED ORDER — METFORMIN HCL ER 500 MG PO TB24
500.0000 mg | ORAL_TABLET | Freq: Every day | ORAL | Status: DC
  Administered 2018-10-01 – 2018-10-02 (×2): 500 mg via ORAL
  Filled 2018-09-30 (×2): qty 1

## 2018-09-30 MED ORDER — OXYCODONE HCL 5 MG/5ML PO SOLN: 5.0000 mg | Freq: Once | ORAL | Status: DC | PRN

## 2018-09-30 MED ORDER — ZOLPIDEM TARTRATE 5 MG PO TABS: 5.0000 mg | ORAL_TABLET | Freq: Every evening | ORAL | Status: DC | PRN

## 2018-09-30 MED ORDER — GABAPENTIN 100 MG PO CAPS
100.0000 mg | ORAL_CAPSULE | Freq: Every day | ORAL | Status: DC
  Administered 2018-09-30 – 2018-10-01 (×2): 100 mg via ORAL
  Filled 2018-09-30 (×2): qty 1

## 2018-09-30 MED ORDER — SODIUM CHLORIDE (PF) 0.9 % IJ SOLN
INTRAMUSCULAR | Status: DC | PRN
  Administered 2018-09-30: 30 mL

## 2018-09-30 MED ORDER — CEFAZOLIN SODIUM-DEXTROSE 2-4 GM/100ML-% IV SOLN
2.0000 g | INTRAVENOUS | Status: AC
  Administered 2018-09-30: 2 g via INTRAVENOUS
  Filled 2018-09-30: qty 100

## 2018-09-30 MED ORDER — BUDESONIDE 0.25 MG/2ML IN SUSP: 0.2500 mg | Freq: Two times a day (BID) | RESPIRATORY_TRACT | Status: DC

## 2018-09-30 MED ORDER — METOCLOPRAMIDE HCL 5 MG PO TABS
5.0000 mg | ORAL_TABLET | Freq: Three times a day (TID) | ORAL | Status: DC | PRN
  Administered 2018-10-02: 10 mg via ORAL
  Filled 2018-09-30: qty 2

## 2018-09-30 MED ORDER — DEXAMETHASONE SODIUM PHOSPHATE 10 MG/ML IJ SOLN
INTRAMUSCULAR | Status: AC
  Filled 2018-09-30: qty 1

## 2018-09-30 MED ORDER — LIDOCAINE 2% (20 MG/ML) 5 ML SYRINGE
INTRAMUSCULAR | Status: AC
  Filled 2018-09-30: qty 5

## 2018-09-30 MED ORDER — DOCUSATE SODIUM 100 MG PO CAPS
100.0000 mg | ORAL_CAPSULE | Freq: Two times a day (BID) | ORAL | Status: DC
  Administered 2018-09-30 – 2018-10-01 (×3): 100 mg via ORAL
  Filled 2018-09-30 (×3): qty 1

## 2018-09-30 MED ORDER — ONDANSETRON HCL 4 MG PO TABS: 4.0000 mg | ORAL_TABLET | Freq: Four times a day (QID) | ORAL | Status: DC | PRN

## 2018-09-30 MED ORDER — HYDROCHLOROTHIAZIDE 25 MG PO TABS
25.0000 mg | ORAL_TABLET | Freq: Every day | ORAL | Status: DC
  Administered 2018-09-30 – 2018-10-01 (×2): 25 mg via ORAL
  Filled 2018-09-30 (×2): qty 1

## 2018-09-30 MED ORDER — MECLIZINE HCL 25 MG PO TABS: 25.0000 mg | ORAL_TABLET | Freq: Three times a day (TID) | ORAL | Status: DC | PRN

## 2018-09-30 MED ORDER — ALLOPURINOL 100 MG PO TABS
100.0000 mg | ORAL_TABLET | Freq: Every day | ORAL | Status: DC
  Administered 2018-10-01: 100 mg via ORAL
  Filled 2018-09-30 (×3): qty 1

## 2018-09-30 MED ORDER — ALBUTEROL SULFATE (2.5 MG/3ML) 0.083% IN NEBU: 3.0000 mL | INHALATION_SOLUTION | Freq: Four times a day (QID) | RESPIRATORY_TRACT | Status: DC | PRN

## 2018-09-30 MED ORDER — PROPOFOL 500 MG/50ML IV EMUL
INTRAVENOUS | Status: DC | PRN
  Administered 2018-09-30: 50 ug/kg/min via INTRAVENOUS

## 2018-09-30 MED ORDER — PANTOPRAZOLE SODIUM 40 MG PO TBEC
40.0000 mg | DELAYED_RELEASE_TABLET | Freq: Every day | ORAL | Status: DC
  Administered 2018-09-30 – 2018-10-01 (×2): 40 mg via ORAL
  Filled 2018-09-30 (×2): qty 1

## 2018-09-30 MED ORDER — METHOCARBAMOL 500 MG IVPB - SIMPLE MED
500.0000 mg | Freq: Four times a day (QID) | INTRAVENOUS | Status: DC | PRN
  Filled 2018-09-30: qty 50

## 2018-09-30 MED ORDER — LACTATED RINGERS IV SOLN
INTRAVENOUS | Status: DC
  Administered 2018-10-01: 02:00:00 via INTRAVENOUS

## 2018-09-30 MED ORDER — ALUM & MAG HYDROXIDE-SIMETH 200-200-20 MG/5ML PO SUSP: 30.0000 mL | ORAL | Status: DC | PRN

## 2018-09-30 MED ORDER — PROPOFOL 10 MG/ML IV BOLUS
INTRAVENOUS | Status: AC
  Filled 2018-09-30: qty 60

## 2018-09-30 MED ORDER — TRANEXAMIC ACID-NACL 1000-0.7 MG/100ML-% IV SOLN
1000.0000 mg | INTRAVENOUS | Status: AC
  Administered 2018-09-30: 1000 mg via INTRAVENOUS
  Filled 2018-09-30: qty 100

## 2018-09-30 MED ORDER — DIPHENHYDRAMINE HCL 12.5 MG/5ML PO ELIX
12.5000 mg | ORAL_SOLUTION | ORAL | Status: DC | PRN
  Administered 2018-09-30: 25 mg via ORAL
  Filled 2018-09-30: qty 10

## 2018-09-30 MED ORDER — OXYCODONE HCL 5 MG PO TABS: 10.0000 mg | ORAL_TABLET | ORAL | Status: DC | PRN

## 2018-09-30 MED ORDER — PHENOL 1.4 % MT LIQD
1.0000 | OROMUCOSAL | Status: DC | PRN
  Filled 2018-09-30: qty 177

## 2018-09-30 MED ORDER — ONDANSETRON HCL 4 MG/2ML IJ SOLN: 4.0000 mg | Freq: Once | INTRAMUSCULAR | Status: DC | PRN

## 2018-09-30 MED ORDER — ONDANSETRON HCL 4 MG/2ML IJ SOLN
4.0000 mg | Freq: Four times a day (QID) | INTRAMUSCULAR | Status: DC | PRN
  Administered 2018-10-02: 4 mg via INTRAVENOUS
  Filled 2018-09-30: qty 2

## 2018-09-30 MED ORDER — ONDANSETRON HCL 4 MG/2ML IJ SOLN
INTRAMUSCULAR | Status: AC
  Filled 2018-09-30: qty 2

## 2018-09-30 MED ORDER — POTASSIUM CHLORIDE CRYS ER 10 MEQ PO TBCR
10.0000 meq | EXTENDED_RELEASE_TABLET | Freq: Two times a day (BID) | ORAL | Status: DC
  Administered 2018-09-30 – 2018-10-01 (×3): 10 meq via ORAL
  Filled 2018-09-30 (×5): qty 1

## 2018-09-30 MED ORDER — OXYCODONE HCL 5 MG PO TABS: 5.0000 mg | ORAL_TABLET | Freq: Once | ORAL | Status: DC | PRN

## 2018-09-30 MED ORDER — ROSUVASTATIN CALCIUM 20 MG PO TABS
20.0000 mg | ORAL_TABLET | Freq: Every day | ORAL | Status: DC
  Administered 2018-10-01: 20 mg via ORAL
  Filled 2018-09-30: qty 1

## 2018-09-30 MED ORDER — BUPIVACAINE-EPINEPHRINE (PF) 0.25% -1:200000 IJ SOLN
INTRAMUSCULAR | Status: AC
  Filled 2018-09-30: qty 30

## 2018-09-30 MED ORDER — LIDOCAINE 2% (20 MG/ML) 5 ML SYRINGE
INTRAMUSCULAR | Status: DC | PRN
  Administered 2018-09-30: 40 mg via INTRAVENOUS

## 2018-09-30 MED ORDER — HYDROMORPHONE HCL 1 MG/ML IJ SOLN: 0.5000 mg | INTRAMUSCULAR | Status: DC | PRN

## 2018-09-30 MED ORDER — METOPROLOL SUCCINATE ER 50 MG PO TB24
100.0000 mg | ORAL_TABLET | Freq: Two times a day (BID) | ORAL | Status: DC
  Administered 2018-09-30 – 2018-10-01 (×3): 100 mg via ORAL
  Filled 2018-09-30 (×3): qty 2

## 2018-09-30 MED ORDER — FENTANYL CITRATE (PF) 100 MCG/2ML IJ SOLN
50.0000 ug | INTRAMUSCULAR | Status: DC
  Administered 2018-09-30: 50 ug via INTRAVENOUS
  Filled 2018-09-30: qty 2

## 2018-09-30 MED ORDER — BUPIVACAINE IN DEXTROSE 0.75-8.25 % IT SOLN
INTRATHECAL | Status: DC | PRN
  Administered 2018-09-30: 1.6 mL via INTRATHECAL

## 2018-09-30 MED ORDER — KETOROLAC TROMETHAMINE 15 MG/ML IJ SOLN
7.5000 mg | Freq: Four times a day (QID) | INTRAMUSCULAR | Status: AC
  Administered 2018-09-30 – 2018-10-01 (×4): 7.5 mg via INTRAVENOUS
  Filled 2018-09-30 (×4): qty 1

## 2018-09-30 MED ORDER — CEFAZOLIN SODIUM-DEXTROSE 2-4 GM/100ML-% IV SOLN
2.0000 g | Freq: Four times a day (QID) | INTRAVENOUS | Status: AC
  Administered 2018-09-30 (×2): 2 g via INTRAVENOUS
  Filled 2018-09-30 (×2): qty 100

## 2018-09-30 MED ORDER — ROPIVACAINE HCL 7.5 MG/ML IJ SOLN
INTRAMUSCULAR | Status: DC | PRN
  Administered 2018-09-30: 20 mL via PERINEURAL

## 2018-09-30 MED ORDER — BUDESONIDE 0.25 MG/2ML IN SUSP
0.2500 mg | Freq: Two times a day (BID) | RESPIRATORY_TRACT | Status: DC
  Administered 2018-09-30: 0.25 mg via RESPIRATORY_TRACT
  Filled 2018-09-30: qty 2

## 2018-09-30 MED ORDER — MENTHOL 3 MG MT LOZG: 1.0000 | LOZENGE | OROMUCOSAL | Status: DC | PRN

## 2018-09-30 MED ORDER — DEXAMETHASONE SODIUM PHOSPHATE 10 MG/ML IJ SOLN
INTRAMUSCULAR | Status: DC | PRN
  Administered 2018-09-30: 10 mg via INTRAVENOUS

## 2018-09-30 MED ORDER — CHLORHEXIDINE GLUCONATE 4 % EX LIQD: 60.0000 mL | Freq: Once | CUTANEOUS | Status: DC

## 2018-09-30 MED ORDER — MIDAZOLAM HCL 2 MG/2ML IJ SOLN
1.0000 mg | INTRAMUSCULAR | Status: DC
  Administered 2018-09-30: 1 mg via INTRAVENOUS
  Filled 2018-09-30: qty 2

## 2018-09-30 MED ORDER — SODIUM CHLORIDE (PF) 0.9 % IJ SOLN
INTRAMUSCULAR | Status: AC
  Filled 2018-09-30: qty 50

## 2018-09-30 MED ORDER — AMLODIPINE BESYLATE 10 MG PO TABS
10.0000 mg | ORAL_TABLET | Freq: Every day | ORAL | Status: DC
  Administered 2018-10-01: 10 mg via ORAL
  Filled 2018-09-30: qty 1

## 2018-09-30 MED ORDER — CITALOPRAM HYDROBROMIDE 20 MG PO TABS
20.0000 mg | ORAL_TABLET | Freq: Every day | ORAL | Status: DC
  Administered 2018-10-01: 20 mg via ORAL
  Filled 2018-09-30: qty 1

## 2018-09-30 MED ORDER — CLONAZEPAM 0.5 MG PO TABS: 0.5000 mg | ORAL_TABLET | Freq: Two times a day (BID) | ORAL | Status: DC | PRN

## 2018-09-30 MED ORDER — LACTATED RINGERS IV SOLN
INTRAVENOUS | Status: DC
  Administered 2018-09-30 (×2): via INTRAVENOUS

## 2018-09-30 MED ORDER — SODIUM CHLORIDE 0.9 % IR SOLN
Status: DC | PRN
  Administered 2018-09-30: 1000 mL

## 2018-09-30 MED ORDER — INSULIN ASPART 100 UNIT/ML ~~LOC~~ SOLN
0.0000 [IU] | Freq: Three times a day (TID) | SUBCUTANEOUS | Status: DC
  Administered 2018-09-30: 4 [IU] via SUBCUTANEOUS

## 2018-09-30 MED ORDER — TRANEXAMIC ACID 1000 MG/10ML IV SOLN
INTRAVENOUS | Status: DC | PRN
  Administered 2018-09-30: 2000 mg via TOPICAL

## 2018-09-30 MED ORDER — BUPIVACAINE LIPOSOME 1.3 % IJ SUSP
INTRAMUSCULAR | Status: DC | PRN
  Administered 2018-09-30: 20 mL

## 2018-09-30 SURGICAL SUPPLY — 52 items
ATTUNE PSFEM LTSZ5 NARCEM KNEE (Femur) ×3 IMPLANT
ATTUNE PSRP INSR SZ5 5 KNEE (Insert) ×2 IMPLANT
ATTUNE PSRP INSR SZ5 5MM KNEE (Insert) ×1 IMPLANT
BAG DECANTER FOR FLEXI CONT (MISCELLANEOUS) ×3 IMPLANT
BAG ZIPLOCK 12X15 (MISCELLANEOUS) ×3 IMPLANT
BANDAGE ACE 6X5 VEL STRL LF (GAUZE/BANDAGES/DRESSINGS) ×3 IMPLANT
BASE TIBIAL ROT PLAT SZ 5 KNEE (Knees) ×1 IMPLANT
BLADE SAGITTAL 25.0X1.19X90 (BLADE) ×2 IMPLANT
BLADE SAGITTAL 25.0X1.19X90MM (BLADE) ×1
BLADE SAW SGTL 13.0X1.19X90.0M (BLADE) ×3 IMPLANT
BOWL SMART MIX CTS (DISPOSABLE) ×3 IMPLANT
CEMENT HV SMART SET (Cement) ×6 IMPLANT
CHLORAPREP W/TINT 26ML (MISCELLANEOUS) ×6 IMPLANT
COVER SURGICAL LIGHT HANDLE (MISCELLANEOUS) ×3 IMPLANT
COVER WAND RF STERILE (DRAPES) IMPLANT
CUFF TOURN SGL QUICK 34 (TOURNIQUET CUFF) ×2
CUFF TRNQT CYL 34X4.125X (TOURNIQUET CUFF) ×1 IMPLANT
DECANTER SPIKE VIAL GLASS SM (MISCELLANEOUS) ×6 IMPLANT
DRAPE SHEET LG 3/4 BI-LAMINATE (DRAPES) ×3 IMPLANT
DRAPE TOP 10253 STERILE (DRAPES) ×3 IMPLANT
DRAPE U-SHAPE 47X51 STRL (DRAPES) ×3 IMPLANT
DRSG AQUACEL AG ADV 3.5X10 (GAUZE/BANDAGES/DRESSINGS) ×3 IMPLANT
ELECT REM PT RETURN 15FT ADLT (MISCELLANEOUS) ×3 IMPLANT
GLOVE BIO SURGEON STRL SZ8 (GLOVE) ×12 IMPLANT
GLOVE BIOGEL PI IND STRL 8 (GLOVE) ×2 IMPLANT
GLOVE BIOGEL PI INDICATOR 8 (GLOVE) ×4
GOWN STRL REUS W/TWL XL LVL3 (GOWN DISPOSABLE) ×6 IMPLANT
HANDPIECE INTERPULSE COAX TIP (DISPOSABLE) ×2
HOLDER FOLEY CATH W/STRAP (MISCELLANEOUS) ×3 IMPLANT
HOOD PEEL AWAY FLYTE STAYCOOL (MISCELLANEOUS) ×6 IMPLANT
KIT TURNOVER KIT A (KITS) IMPLANT
MANIFOLD NEPTUNE II (INSTRUMENTS) ×3 IMPLANT
NS IRRIG 1000ML POUR BTL (IV SOLUTION) IMPLANT
PACK TOTAL KNEE CUSTOM (KITS) ×3 IMPLANT
PAD ARMBOARD 7.5X6 YLW CONV (MISCELLANEOUS) ×3 IMPLANT
PATELLA MEDIAL ATTUN 35MM KNEE (Knees) ×3 IMPLANT
PIN STEINMAN FIXATION KNEE (PIN) ×3 IMPLANT
PROTECTOR NERVE ULNAR (MISCELLANEOUS) ×3 IMPLANT
SET HNDPC FAN SPRY TIP SCT (DISPOSABLE) ×1 IMPLANT
SUT STRATAFIX 0 PDS 27 VIOLET (SUTURE)
SUT VIC AB 0 CT1 36 (SUTURE) ×3 IMPLANT
SUT VIC AB 2-0 CT1 27 (SUTURE) ×2
SUT VIC AB 2-0 CT1 TAPERPNT 27 (SUTURE) ×1 IMPLANT
SUT VIC AB 3-0 CT1 27 (SUTURE) ×2
SUT VIC AB 3-0 CT1 TAPERPNT 27 (SUTURE) ×1 IMPLANT
SUT VLOC 180 0 24IN GS25 (SUTURE) ×3 IMPLANT
SUTURE STRATFX 0 PDS 27 VIOLET (SUTURE) IMPLANT
TIBIAL BASE ROT PLAT SZ 5 KNEE (Knees) ×3 IMPLANT
TRAY FOLEY CATH 14FR (SET/KITS/TRAYS/PACK) ×3 IMPLANT
TRAY FOLEY MTR SLVR 16FR STAT (SET/KITS/TRAYS/PACK) IMPLANT
WATER STERILE IRR 1000ML POUR (IV SOLUTION) ×6 IMPLANT
WRAP KNEE MAXI GEL POST OP (GAUZE/BANDAGES/DRESSINGS) ×3 IMPLANT

## 2018-09-30 NOTE — Op Note (Signed)
PREOP DIAGNOSIS: DJD LEFT KNEE POSTOP DIAGNOSIS:  same PROCEDURE: LEFT TKR ANESTHESIA: Spinal and MAC ATTENDING SURGEON: Hessie Dibble ASSISTANT: Brooke Dolly PA  INDICATIONS FOR PROCEDURE: Brooke [name] is a 68 y.o. female who has struggled for a long time with pain due to degenerative arthritis of the left knee.  The patient has failed many conservative non-operative measures and at this point has pain which limits the ability to sleep and walk.  The patient is offered total knee replacement.  Informed operative consent was obtained after discussion of possible risks of anesthesia, infection, neurovascular injury, DVT, and death.  The importance of the post-operative rehabilitation protocol to optimize result was stressed extensively with the patient.  SUMMARY OF FINDINGS AND PROCEDURE:  Brooke [name] was taken to the operative suite where under the above anesthesia a left knee replacement was performed.  There were advanced degenerative changes and the bone quality was good.  We used the DePuyAttune system and placed size 5 narrow femur, 5 tibia, 35 mm all polyethylene patella, and a size 5 mm spacer.  Brooke Dolly PA-C assisted throughout and was invaluable to the completion of the case in that he helped retract and maintain exposure while I placed the components.  He also helped close thereby minimizing OR time.  The patient was admitted for appropriate post-op care to include perioperative antibiotics and mechanical and pharmacologic measures for DVT prophylaxis.  DESCRIPTION OF PROCEDURE:  Brooke [name] was taken to the operative suite where the above anesthesia was applied.  The patient was positioned supine and prepped and draped in normal sterile fashion.  An appropriate time out was performed.  After the administration of kefzol pre-op antibiotic the leg was elevated and exsanguinated and a tourniquet inflated.  A standard longitudinal incision was made on the anterior knee.  Dissection was  carried down to the extensor mechanism.  All appropriate anti-infective measures were used including the pre-operative antibiotic, betadine impregnated drape, and closed hooded exhaust systems for each member of the surgical team.  A medial parapatellar incision was made in the extensor mechanism and the knee cap flipped and the knee flexed.  Some residual meniscal tissues were removed along with any remaining ACL/PCL tissue.  A guide was placed on the tibia and a flat cut was made on it's superior surface.  An intramedullary guide was placed in the femur and was utilized to make anterior and posterior cuts creating an appropriate flexion gap.  A second intramedullary guide was placed in the femur to make a distal cut properly balancing the knee with an extension gap equal to the flexion gap.  The three bones sized to the above mentioned sizes and the appropriate guides were placed and utilized.  A trial reduction was done and the knee easily came to full extension and the patella tracked well on flexion.  The trial components were removed and all bones were cleaned with pulsatile lavage and then dried thoroughly.  Cement was mixed and was pressurized onto the bones followed by placement of the aforementioned components.  Excess cement was trimmed and pressure was held on the components until the cement had hardened.  The tourniquet was deflated and a small amount of bleeding was controlled with cautery and pressure.  The knee was irrigated thoroughly.  The extensor mechanism was re-approximated with V-loc suture in running fashion.  The knee was flexed and the repair was solid.  The subcutaneous tissues were re-approximated with #0 and #2-0 vicryl and the  skin closed with a subcuticular stitch and steristrips.  A sterile dressing was applied.  Intraoperative fluids, EBL, and tourniquet time can be obtained from anesthesia records.  DISPOSITION:  The patient was taken to recovery room in stable condition and  admitted for appropriate post-op care to include peri-operative antibiotic and DVT prophylaxis with mechanical and pharmacologic measures.  Monico Blitz Rondia Higginbotham 09/30/2018, 11:01 AM

## 2018-09-30 NOTE — Interval H&P Note (Signed)
History and Physical Interval Note:  09/30/2018 9:10 AM  Brooke [name]  has presented today for surgery, with the diagnosis of LEFT KNEE OSTEOARTHRITIS.  The various methods of treatment have been discussed with the patient and family. After consideration of risks, benefits and other options for treatment, the patient has consented to  Procedure(s): TOTAL KNEE ARTHROPLASTY (Left) as a surgical intervention.  The patient's history has been reviewed, patient examined, no change in status, stable for surgery.  I have reviewed the patient's chart and labs.  Questions were answered to the patient's satisfaction.     Hessie Dibble

## 2018-09-30 NOTE — Transfer of Care (Signed)
Immediate Anesthesia Transfer of Care Note  Patient: Brooke [name]  Procedure(s) Performed: TOTAL KNEE ARTHROPLASTY (Left Knee)  Patient Location: PACU  Anesthesia Type:Spinal  Level of Consciousness: sedated  Airway & Oxygen Therapy: Patient Spontanous Breathing and Patient connected to face mask oxygen  Post-op Assessment: Report given to RN and Post -op Vital signs reviewed and stable  Post vital signs: Reviewed and stable  Last Vitals:  Vitals Value Taken Time  BP    Temp    Pulse 64 09/30/2018 11:22 AM  Resp 14 09/30/2018 11:22 AM  SpO2 100 % 09/30/2018 11:22 AM  Vitals shown include unvalidated device data.  Last Pain:  Vitals:   09/30/18 0915  TempSrc:   PainSc: 0-No pain         Complications: No apparent anesthesia complications

## 2018-09-30 NOTE — Evaluation (Signed)
Physical Therapy Evaluation Patient Details Name: Brooke [name] MRN: [id number] DOB: 11-02-1950 Today's Date: 09/30/2018   History of Present Illness  72 uyo female S/P left TKA on 09/30/18. H/O R TKA 05/05/18, depression, vertigo, Vtach, syncope,DMII, HTN, Renal artery stenosis.  Clinical Impression  The patient is firm about going home and not to Rehab . The patient will need to be near modified independent for basic functional mobility as family physical assistance is limited, per patient.  Pt admitted with above diagnosis. Pt currently with functional limitations due to the deficits listed below (see PT Problem List).  Pt will benefit from skilled PT to increase their independence and safety with mobility to allow discharge to the venue listed below.       Follow Up Recommendations Home health PT    Equipment Recommendations  None recommended by PT    Recommendations for Other Services OT consult     Precautions / Restrictions Precautions Precautions: Knee;Fall Restrictions Weight Bearing Restrictions: No      Mobility  Bed Mobility Overal bed mobility: Needs Assistance Bed Mobility: Supine to Sit     Supine to sit: Min assist     General bed mobility comments: support left leg to floor.  Transfers Overall transfer level: Needs assistance Equipment used: Rolling walker (2 wheeled) Transfers: Sit to/from Stand Sit to Stand: Min assist         General transfer comment: cues for hand and left leg position  Ambulation/Gait Ambulation/Gait assistance: Min assist Gait Distance (Feet): 8 Feet Assistive device: Rolling walker (2 wheeled) Gait Pattern/deviations: Step-to pattern;Antalgic     General Gait Details: cues for sequence and position  Stairs            Wheelchair Mobility    Modified Rankin (Stroke Patients Only)       Balance                                             Pertinent Vitals/Pain Pain Assessment:  0-10 Pain Score: 3  Pain Location: left knee Pain Descriptors / Indicators: Discomfort Pain Intervention(s): Monitored during session;Limited activity within patient's tolerance;Premedicated before session;Ice applied    Home Living Family/patient expects to be discharged to:: Private residence Living Arrangements: Spouse/significant other;Children Available Help at Discharge: Family;Available PRN/intermittently Type of Home: Apartment Home Access: Stairs to enter   Entrance Stairs-Number of Steps: 3+ 4 Home Layout: One level Home Equipment: Walker - 2 wheels;Bedside commode Additional Comments: spouse requires total care, son has spinal tumors and unable to assist, daughter is limited  in assisting.    Prior Function Level of Independence: Independent with assistive device(s)               Hand Dominance   Dominant Hand: Right    Extremity/Trunk Assessment   Upper Extremity Assessment Upper Extremity Assessment: Defer to OT evaluation    Lower Extremity Assessment Lower Extremity Assessment: LLE deficits/detail LLE Deficits / Details: able to perform SLR, Knee flexion 5-60     Cervical / Trunk Assessment Cervical / Trunk Assessment: Normal  Communication   Communication: No difficulties  Cognition Arousal/Alertness: Awake/alert Behavior During Therapy: WFL for tasks assessed/performed Overall Cognitive Status: Within Functional Limits for tasks assessed  General Comments      Exercises     Assessment/Plan    PT Assessment Patient needs continued PT services  PT Problem List Decreased range of motion;Decreased strength;Decreased activity tolerance;Decreased knowledge of use of DME;Decreased safety awareness;Decreased mobility;Decreased knowledge of precautions;Pain       PT Treatment Interventions DME instruction;Therapeutic activities;Gait training;Therapeutic exercise;Stair training;Functional  mobility training;Patient/family education    PT Goals (Current goals can be found in the Care Plan section)  Acute Rehab PT Goals Patient Stated Goal: to go home, not SNF PT Goal Formulation: With patient Time For Goal Achievement: 10/07/18 Potential to Achieve Goals: Good    Frequency 7X/week   Barriers to discharge Decreased caregiver support      Co-evaluation               AM-PAC PT "6 Clicks" Mobility  Outcome Measure Help needed turning from your back to your side while in a flat bed without using bedrails?: A Little Help needed moving from lying on your back to sitting on the side of a flat bed without using bedrails?: A Little Help needed moving to and from a bed to a chair (including a wheelchair)?: A Lot Help needed standing up from a chair using your arms (e.g., wheelchair or bedside chair)?: A Lot Help needed to walk in hospital room?: A Lot Help needed climbing 3-5 steps with a railing? : Total 6 Click Score: 13    End of Session Equipment Utilized During Treatment: Gait belt Activity Tolerance: Patient tolerated treatment well Patient left: in chair;with call bell/phone within reach;with chair alarm set Nurse Communication: Mobility status PT Visit Diagnosis: Unsteadiness on feet (R26.81);Pain Pain - Right/Left: Left Pain - part of body: Knee    Time: 4801-6553 PT Time Calculation (min) (ACUTE ONLY): 24 min   Charges:   PT Evaluation $PT Eval Low Complexity: 1 Low PT Treatments $Gait Training: 8-22 mins        Tresa Endo PT Acute Rehabilitation Services Pager [phone] Office [phone]   Claretha Cooper 09/30/2018, 4:50 PM

## 2018-09-30 NOTE — Anesthesia Procedure Notes (Signed)
Spinal  Patient location during procedure: OR Start time: 09/30/2018 9:24 AM End time: 09/30/2018 9:30 AM Staffing Anesthesiologist: Audry Pili, MD Performed: anesthesiologist  Preanesthetic Checklist Completed: patient identified, site marked, surgical consent, pre-op evaluation, timeout performed, IV checked, risks and benefits discussed and monitors and equipment checked Spinal Block Patient position: sitting Prep: DuraPrep Patient monitoring: heart rate, cardiac monitor, continuous pulse ox and blood pressure Approach: midline Location: L3-4 Injection technique: single-shot Needle Needle type: Quincke  Needle gauge: 22 G Needle length: 9 cm Assessment Sensory level: T4 Additional Notes Consent was obtained prior to the procedure with all questions answered and concerns addressed. Risks including, but not limited to, bleeding, infection, nerve damage, paralysis, failed block, inadequate analgesia, allergic reaction, high spinal, itching, and headache were discussed and the patient wished to proceed. Functioning IV was confirmed and monitors were applied. Sterile prep and drape, including hand hygiene, mask, and sterile gloves were used. The patient was positioned and the spine was prepped. The skin was anesthetized with lidocaine. Free flow of clear CSF was obtained prior to injecting local anesthetic into the CSF. The spinal needle aspirated freely following injection. The needle was carefully withdrawn. The patient tolerated the procedure well.   Renold Don, MD

## 2018-09-30 NOTE — Anesthesia Procedure Notes (Signed)
Anesthesia Regional Block: Adductor canal block   Pre-Anesthetic Checklist: ,, timeout performed, Correct Patient, Correct Site, Correct Laterality, Correct Procedure, Correct Position, site marked, Risks and benefits discussed,  Surgical consent,  Pre-op evaluation,  At surgeon's request and post-op pain management  Laterality: Left  Prep: chloraprep       Needles:  Injection technique: Single-shot  Needle Type: Echogenic Needle     Needle Length: 10cm  Needle Gauge: 21     Additional Needles:   Narrative:  Start time: 09/30/2018 9:08 AM End time: 09/30/2018 9:12 AM Injection made incrementally with aspirations every 5 mL.  Performed by: Personally  Anesthesiologist: Audry Pili, MD  Additional Notes: No pain on injection. No increased resistance to injection. Injection made in 5cc increments. Good needle visualization. Patient tolerated the procedure well.

## 2018-09-30 NOTE — Anesthesia Postprocedure Evaluation (Signed)
Anesthesia Post Note  Patient: Brooke [name]  Procedure(s) Performed: TOTAL KNEE ARTHROPLASTY (Left Knee)     Patient location during evaluation: PACU Anesthesia Type: Spinal Level of consciousness: awake and alert Pain management: pain level controlled Vital Signs Assessment: post-procedure vital signs reviewed and stable Respiratory status: spontaneous breathing and respiratory function stable Cardiovascular status: blood pressure returned to baseline and stable Postop Assessment: spinal receding and no apparent nausea or vomiting Anesthetic complications: no    Last Vitals:  Vitals:   09/30/18 1230 09/30/18 1245  BP: (!) 145/65 137/67  Pulse: (!) 54 (!) 53  Resp: 15 14  Temp: (!) 36.4 C (!) 36.4 C  SpO2: 100% 100%    Last Pain:  Vitals:   09/30/18 1323  TempSrc:   PainSc: [place]

## 2018-09-30 NOTE — Anesthesia Procedure Notes (Signed)
Date/Time: 09/30/2018 9:24 AM Performed by: Talbot Grumbling, CRNA Oxygen Delivery Method: Simple face mask

## 2018-09-30 NOTE — Progress Notes (Signed)
AssistedDr. Brock with left, ultrasound guided, adductor canal block. Side rails up, monitors on throughout procedure. See vital signs in flow sheet. Tolerated Procedure well.  

## 2018-10-01 DIAGNOSIS — J45909 Unspecified asthma, uncomplicated: Secondary | ICD-10-CM | POA: Diagnosis not present

## 2018-10-01 DIAGNOSIS — E039 Hypothyroidism, unspecified: Secondary | ICD-10-CM | POA: Diagnosis not present

## 2018-10-01 DIAGNOSIS — Z888 Allergy status to other drugs, medicaments and biological substances status: Secondary | ICD-10-CM | POA: Diagnosis not present

## 2018-10-01 DIAGNOSIS — R2681 Unsteadiness on feet: Secondary | ICD-10-CM | POA: Diagnosis not present

## 2018-10-01 DIAGNOSIS — M1712 Unilateral primary osteoarthritis, left knee: Secondary | ICD-10-CM | POA: Diagnosis not present

## 2018-10-01 DIAGNOSIS — Z7982 Long term (current) use of aspirin: Secondary | ICD-10-CM | POA: Diagnosis not present

## 2018-10-01 DIAGNOSIS — Z8249 Family history of ischemic heart disease and other diseases of the circulatory system: Secondary | ICD-10-CM | POA: Diagnosis not present

## 2018-10-01 DIAGNOSIS — N183 Chronic kidney disease, stage 3 (moderate): Secondary | ICD-10-CM | POA: Diagnosis not present

## 2018-10-01 DIAGNOSIS — K219 Gastro-esophageal reflux disease without esophagitis: Secondary | ICD-10-CM | POA: Diagnosis not present

## 2018-10-01 DIAGNOSIS — Z7984 Long term (current) use of oral hypoglycemic drugs: Secondary | ICD-10-CM | POA: Diagnosis not present

## 2018-10-01 DIAGNOSIS — Z833 Family history of diabetes mellitus: Secondary | ICD-10-CM | POA: Diagnosis not present

## 2018-10-01 DIAGNOSIS — I1310 Hypertensive heart and chronic kidney disease without heart failure, with stage 1 through stage 4 chronic kidney disease, or unspecified chronic kidney disease: Secondary | ICD-10-CM | POA: Diagnosis not present

## 2018-10-01 DIAGNOSIS — E1122 Type 2 diabetes mellitus with diabetic chronic kidney disease: Secondary | ICD-10-CM | POA: Diagnosis not present

## 2018-10-01 DIAGNOSIS — Z96651 Presence of right artificial knee joint: Secondary | ICD-10-CM | POA: Diagnosis not present

## 2018-10-01 DIAGNOSIS — Z885 Allergy status to narcotic agent status: Secondary | ICD-10-CM | POA: Diagnosis not present

## 2018-10-01 DIAGNOSIS — E785 Hyperlipidemia, unspecified: Secondary | ICD-10-CM | POA: Diagnosis not present

## 2018-10-01 DIAGNOSIS — Z791 Long term (current) use of non-steroidal anti-inflammatories (NSAID): Secondary | ICD-10-CM | POA: Diagnosis not present

## 2018-10-01 DIAGNOSIS — Z886 Allergy status to analgesic agent status: Secondary | ICD-10-CM | POA: Diagnosis not present

## 2018-10-01 DIAGNOSIS — E0520 Thyrotoxicosis with toxic multinodular goiter without thyrotoxic crisis or storm: Secondary | ICD-10-CM | POA: Diagnosis not present

## 2018-10-01 DIAGNOSIS — Z79899 Other long term (current) drug therapy: Secondary | ICD-10-CM | POA: Diagnosis not present

## 2018-10-01 DIAGNOSIS — M109 Gout, unspecified: Secondary | ICD-10-CM | POA: Diagnosis not present

## 2018-10-01 LAB — GLUCOSE, CAPILLARY
Glucose-Capillary: 98 mg/dL (ref 70–99)
Glucose-Capillary: 103 mg/dL — ABNORMAL HIGH (ref 70–99)
Glucose-Capillary: 83 mg/dL (ref 70–99)
Glucose-Capillary: 110 mg/dL — ABNORMAL HIGH (ref 70–99)

## 2018-10-01 NOTE — Plan of Care (Signed)
Plan of care reviewed and discussed with the patient. 

## 2018-10-01 NOTE — Progress Notes (Signed)
Physical Therapy Treatment Patient Details Name: Brooke [name] MRN: [id number] DOB: 10-09-1950 Today's Date: 10/01/2018    History of Present Illness 68 yo female S/P left TKA on 09/30/18. H/O R TKA 05/05/18, depression, vertigo, Vtach, syncope,DMII, HTN, Renal artery stenosis.    PT Comments    Pt motivated and progressing well with mobility.  This pm pt tolerated ambulating increased distance in hall and managed toileting and hygiene with SBA only.   Follow Up Recommendations  Home health PT     Equipment Recommendations  None recommended by PT    Recommendations for Other Services OT consult     Precautions / Restrictions Precautions Precautions: Knee;Fall Restrictions Weight Bearing Restrictions: No Other Position/Activity Restrictions: WBAT    Mobility  Bed Mobility Overal bed mobility: Needs Assistance Bed Mobility: Sit to Supine       Sit to supine: Min guard   General bed mobility comments: for LLE.  Pt has a leg lifter at home  Transfers Overall transfer level: Needs assistance Equipment used: Rolling walker (2 wheeled) Transfers: Sit to/from Stand Sit to Stand: Min guard         General transfer comment: for safety; cues for LE placement when sitting  Ambulation/Gait Ambulation/Gait assistance: Min guard Gait Distance (Feet): 160 Feet(and 15' back from bathroom) Assistive device: Rolling walker (2 wheeled) Gait Pattern/deviations: Step-to pattern;Step-through pattern;Decreased step length - right;Decreased step length - left;Shuffle;Trunk flexed Gait velocity: decr   General Gait Details: cues for sequence. posture and position from [employer] Mobility    Modified Rankin (Stroke Patients Only)       Balance Overall balance assessment: Mild deficits observed, not formally tested                                          Cognition Arousal/Alertness: Awake/alert Behavior During Therapy:  WFL for tasks assessed/performed Overall Cognitive Status: Within Functional Limits for tasks assessed                                        Exercises Total Joint Exercises Ankle Circles/Pumps: AROM;Both;15 reps;Supine Quad Sets: AROM;Both;Supine;10 reps Heel Slides: AAROM;Right;15 reps;Supine Straight Leg Raises: AAROM;AROM;Right;20 reps;Supine Goniometric ROM: AAROM L knee -10 - 80    General Comments        Pertinent Vitals/Pain Pain Assessment: 0-10 Pain Score: 4  Pain Location: left knee Pain Descriptors / Indicators: Aching;Sore Pain Intervention(s): Limited activity within patient's tolerance;Monitored during session;Premedicated before session;Ice applied    Home Living                      Prior Function            PT Goals (current goals can now be found in the care plan section) Acute Rehab PT Goals Patient Stated Goal: to go home, not SNF PT Goal Formulation: With patient Time For Goal Achievement: 10/07/18 Potential to Achieve Goals: Good Progress towards PT goals: Progressing toward goals    Frequency    7X/week      PT Plan Current plan remains appropriate    Co-evaluation              AM-PAC PT "6 Clicks" Mobility  Outcome Measure  Help needed turning from your back to your side while in a flat bed without using bedrails?: A Little Help needed moving from lying on your back to sitting on the side of a flat bed without using bedrails?: A Little Help needed moving to and from a bed to a chair (including a wheelchair)?: A Little Help needed standing up from a chair using your arms (e.g., wheelchair or bedside chair)?: A Little Help needed to walk in hospital room?: A Little Help needed climbing 3-5 steps with a railing? : A Lot 6 Click Score: 17    End of Session Equipment Utilized During Treatment: Gait belt Activity Tolerance: Patient tolerated treatment well Patient left: in bed;with call bell/phone  within reach;with family/visitor present Nurse Communication: Mobility status PT Visit Diagnosis: Unsteadiness on feet (R26.81);Pain Pain - Right/Left: Left Pain - part of body: Knee     Time: 1347-1410 PT Time Calculation (min) (ACUTE ONLY): 23 min  Charges:  $Gait Training: 8-22 mins $Therapeutic Exercise: 8-22 mins $Therapeutic Activity: 8-22 mins                     Brooke [name] PT Acute Rehabilitation Services Pager [phone] Office [phone]    Brooke [name] 10/01/2018, 2:14 PM

## 2018-10-01 NOTE — Progress Notes (Signed)
Physical Therapy Treatment Patient Details Name: Brooke [name] MRN: [id number] DOB: 08-02-1950 Today's Date: 10/01/2018    History of Present Illness 68 yo female S/P left TKA on 09/30/18. H/O R TKA 05/05/18, depression, vertigo, Vtach, syncope,DMII, HTN, Renal artery stenosis.    PT Comments    Pt performed therex program with assist - pt requests rest break prior to ambulation.  Follow Up Recommendations  Home health PT     Equipment Recommendations  None recommended by PT    Recommendations for Other Services OT consult     Precautions / Restrictions Precautions Precautions: Knee;Fall Restrictions Weight Bearing Restrictions: No Other Position/Activity Restrictions: WBAT    Mobility  Bed Mobility         Supine to sit: Min assist     General bed mobility comments: for LLE.  Pt has a leg lifter at home  Transfers   Equipment used: Rolling walker (2 wheeled)   Sit to Stand: Min guard         General transfer comment: for safety; cues for LE placement when sitting  Ambulation/Gait                 Stairs             Wheelchair Mobility    Modified Rankin (Stroke Patients Only)       Balance                                            Cognition Arousal/Alertness: Awake/alert Behavior During Therapy: WFL for tasks assessed/performed Overall Cognitive Status: Within Functional Limits for tasks assessed                                        Exercises Total Joint Exercises Ankle Circles/Pumps: AROM;Both;15 reps;Supine Quad Sets: AROM;Both;Supine;10 reps Heel Slides: AAROM;Right;15 reps;Supine Straight Leg Raises: AAROM;AROM;Right;20 reps;Supine Goniometric ROM: AAROM L knee -10 - 80    General Comments        Pertinent Vitals/Pain Pain Assessment: 0-10 Pain Score: 4  Pain Location: left knee Pain Descriptors / Indicators: Discomfort Pain Intervention(s): Limited activity within  patient's tolerance;Monitored during session;Premedicated before session;Ice applied    Home Living Family/patient expects to be discharged to:: Private residence               Additional Comments: Pt will stay with son and daughter in law.  Assistance will be limited as daughter in law has 3 children, including 2 y o.  Pt plans to sponge bathe. Family is helping to care for her husband, who requires total A    Prior Function Level of Independence: Independent with assistive device(s)      Comments: Using cane for ambulation, for longer distance ambulation would use RW. Husband is total care, and pt is primary caregiver for husband. Pt requesting rehab for PT.    PT Goals (current goals can now be found in the care plan section) Acute Rehab PT Goals Patient Stated Goal: to go home, not SNF PT Goal Formulation: With patient Time For Goal Achievement: 10/07/18 Potential to Achieve Goals: Good Progress towards PT goals: Progressing toward goals    Frequency    7X/week      PT Plan Current plan remains appropriate    Co-evaluation  AM-PAC PT "6 Clicks" Mobility   Outcome Measure  Help needed turning from your back to your side while in a flat bed without using bedrails?: A Little Help needed moving from lying on your back to sitting on the side of a flat bed without using bedrails?: A Little Help needed moving to and from a bed to a chair (including a wheelchair)?: A Little Help needed standing up from a chair using your arms (e.g., wheelchair or bedside chair)?: A Little Help needed to walk in hospital room?: A Little Help needed climbing 3-5 steps with a railing? : A Lot 6 Click Score: 17    End of Session Equipment Utilized During Treatment: Gait belt Activity Tolerance: Patient tolerated treatment well Patient left: in chair;with call bell/phone within reach;with chair alarm set Nurse Communication: Mobility status PT Visit Diagnosis:  Unsteadiness on feet (R26.81);Pain Pain - Right/Left: Left Pain - part of body: Knee     Time: 9563-8756 PT Time Calculation (min) (ACUTE ONLY): 20 min  Charges:  $Therapeutic Exercise: 8-22 mins                     Brooke [name] [phone] Office [phone]    Brooke [name] 10/01/2018, 1:14 PM

## 2018-10-01 NOTE — Progress Notes (Signed)
Subjective: 1 Day Post-Op Procedure(s) (LRB): TOTAL KNEE ARTHROPLASTY (Left)   Patient resting comfortably. She has not walked outside of the room yet. She states that she went to SNF last time and had a terrible experience so she wants to go home this time.  Activity level:  wbat Diet tolerance:  ok Voiding:  ok Patient reports pain as mild.    Objective: Vital signs in last 24 hours: Temp:  [96.4 F (35.8 C)-98.2 F (36.8 C)] 98 F (36.7 C) (03/11 0510) Pulse Rate:  [53-65] 55 (03/11 0510) Resp:  [10-19] 14 (03/11 0510) BP: (122-163)/(62-86) 146/66 (03/11 0510) SpO2:  [96 %-100 %] 97 % (03/11 0510) Weight:  [70.8 kg] 70.8 kg (03/10 0744)  Labs: No results for input(s): HGB in the last 72 hours. No results for input(s): WBC, RBC, HCT, PLT in the last 72 hours. No results for input(s): NA, K, CL, CO2, BUN, CREATININE, GLUCOSE, CALCIUM in the last 72 hours. No results for input(s): LABPT, INR in the last 72 hours.  Physical Exam:  Neurologically intact ABD soft Neurovascular intact Sensation intact distally Intact pulses distally Dorsiflexion/Plantar flexion intact Incision: dressing C/D/I and no drainage No cellulitis present Compartment soft  Assessment/Plan:  1 Day Post-Op Procedure(s) (LRB): TOTAL KNEE ARTHROPLASTY (Left) Advance diet Up with therapy D/C IV fluids Discharge home with home health tomorrow if doing well and cleared by PT. Continue on ASA 325mg  BID x 2 weeks post op. Follow up in office 2 weeks post op.  Brooke [name] Brooke [name] 10/01/2018, 7:36 AM

## 2018-10-01 NOTE — Care Management Obs Status (Signed)
[place] NOTIFICATION   Patient Details  Name: Brooke [name] MRN: [id number] Date of Birth: 1951/01/13   Medicare Observation Status Notification Given:  Yes    Leeroy Cha, RN 10/01/2018, 10:00 AM

## 2018-10-01 NOTE — TOC Transition Note (Signed)
Transition of Care [facility]) - CM/SW Discharge Note   Patient Details  Name: Brooke [name] MRN: [id number] Date of Birth: 1951/06/25  Transition of Care [facility]) CM/SW Contact:  Lia Hopping, [place] Phone Number: 10/01/2018, 10:15 AM   Clinical Narrative:    Pre[facility]anged [facility]   Final next level of care: [place]     Patient Goals and CMS Choice Patient states their goals for this hospitalization and ongoing recovery are:: to recover      Discharge Placement  Home                     Discharge Plan and Services  [facility]             DME  3 IN 1 Walker [employer]        Social Determinants of Health (SDOH) Interventions  N/A   Readmission Risk Interventions No flowsheet data found.

## 2018-10-01 NOTE — Progress Notes (Signed)
Physical Therapy Treatment Patient Details Name: Brooke [name] MRN: [id number] DOB: 04-Oct-1950 Today's Date: 10/01/2018    History of Present Illness 68 yo female S/P left TKA on 09/30/18. H/O R TKA 05/05/18, depression, vertigo, Vtach, syncope,DMII, HTN, Renal artery stenosis.    PT Comments    Pt up to ambulate increased distance in hall.  Pt progressing well with mobility.   Follow Up Recommendations  Home health PT     Equipment Recommendations  None recommended by PT    Recommendations for Other Services OT consult     Precautions / Restrictions Precautions Precautions: Knee;Fall Restrictions Weight Bearing Restrictions: No Other Position/Activity Restrictions: WBAT    Mobility  Bed Mobility         Supine to sit: Min assist     General bed mobility comments: for LLE.  Pt has a leg lifter at home  Transfers Overall transfer level: Needs assistance Equipment used: Rolling walker (2 wheeled) Transfers: Sit to/from Stand Sit to Stand: Min guard         General transfer comment: for safety; cues for LE placement when sitting  Ambulation/Gait Ambulation/Gait assistance: Min assist Gait Distance (Feet): 120 Feet Assistive device: Rolling walker (2 wheeled) Gait Pattern/deviations: Step-to pattern;Antalgic Gait velocity: decr   General Gait Details: cues for sequence. posture and position from [employer] Mobility    Modified Rankin (Stroke Patients Only)       Balance Overall balance assessment: Mild deficits observed, not formally tested                                          Cognition Arousal/Alertness: Awake/alert Behavior During Therapy: WFL for tasks assessed/performed Overall Cognitive Status: Within Functional Limits for tasks assessed                                        Exercises Total Joint Exercises Ankle Circles/Pumps: AROM;Both;15 reps;Supine Quad Sets:  AROM;Both;Supine;10 reps Heel Slides: AAROM;Right;15 reps;Supine Straight Leg Raises: AAROM;AROM;Right;20 reps;Supine Goniometric ROM: AAROM L knee -10 - 80    General Comments        Pertinent Vitals/Pain Pain Assessment: 0-10 Pain Score: 6  Pain Location: left knee Pain Descriptors / Indicators: Aching;Sore Pain Intervention(s): Limited activity within patient's tolerance;Monitored during session;Premedicated before session;Ice applied    Home Living Family/patient expects to be discharged to:: Private residence               Additional Comments: Pt will stay with son and daughter in law.  Assistance will be limited as daughter in law has 3 children, including 2 y o.  Pt plans to sponge bathe. Family is helping to care for her husband, who requires total A    Prior Function Level of Independence: Independent with assistive device(s)      Comments: Using cane for ambulation, for longer distance ambulation would use RW. Husband is total care, and pt is primary caregiver for husband. Pt requesting rehab for PT.    PT Goals (current goals can now be found in the care plan section) Acute Rehab PT Goals Patient Stated Goal: to go home, not SNF PT Goal Formulation: With patient Time For Goal Achievement: 10/07/18 Potential to Achieve Goals:  Good Progress towards PT goals: Progressing toward goals    Frequency    7X/week      PT Plan Current plan remains appropriate    Co-evaluation              AM-PAC PT "6 Clicks" Mobility   Outcome Measure  Help needed turning from your back to your side while in a flat bed without using bedrails?: A Little Help needed moving from lying on your back to sitting on the side of a flat bed without using bedrails?: A Little Help needed moving to and from a bed to a chair (including a wheelchair)?: A Little Help needed standing up from a chair using your arms (e.g., wheelchair or bedside chair)?: A Little Help needed to walk in  hospital room?: A Little Help needed climbing 3-5 steps with a railing? : A Lot 6 Click Score: 17    End of Session Equipment Utilized During Treatment: Gait belt Activity Tolerance: Patient tolerated treatment well Patient left: in chair;with call bell/phone within reach;with chair alarm set Nurse Communication: Mobility status PT Visit Diagnosis: Unsteadiness on feet (R26.81);Pain Pain - Right/Left: Left Pain - part of body: Knee     Time: 1057-1110 PT Time Calculation (min) (ACUTE ONLY): 13 min  Charges:  $Gait Training: 8-22 mins $Therapeutic Exercise: 8-22 mins                     [place] Pager [phone] Office [phone]    Janey Petron 10/01/2018, 1:21 PM

## 2018-10-01 NOTE — Evaluation (Signed)
Occupational Therapy Evaluation Patient Details Name: Brooke [name] MRN: [id number] DOB: [birth year] Today's Date: 10/01/2018    History of Present Illness 68 yo female S/P left TKA on 09/30/18. H/O R TKA 05/05/18, depression, vertigo, Vtach, syncope,DMII, HTN, Renal artery stenosis.   Clinical Impression   Pt was admitted for the above.  At baseline, she is mod I with adls. She plans to stay with son and daughter in law, but will not have much assistance for adls beyond donning ted hose/socks and for meals.  Pt is very independent natured. She does fatique quickly. Will follow in acute setting with mod I level goals, emphasizing energy conservation and safety    Follow Up Recommendations  Supervision/Assistance - 24 hour    Equipment Recommendations  3 in 1 bedside commode    Recommendations for Other Services       Precautions / Restrictions Precautions Precautions: Knee;Fall Restrictions Weight Bearing Restrictions: No      Mobility Bed Mobility         Supine to sit: Min assist     General bed mobility comments: for LLE.  Pt has a leg lifter at home  Transfers   Equipment used: Rolling walker (2 wheeled)   Sit to Stand: Min guard         General transfer comment: for safety; cues for LE placement when sitting    Balance                                           ADL either performed or assessed with clinical judgement   ADL Overall ADL's : Needs assistance/impaired Eating/Feeding: Independent   Grooming: Oral care;Set up;Sitting(BSC in front of sink)   Upper Body Bathing: Set up   Lower Body Bathing: Minimal assistance;Sit to/from stand   Upper Body Dressing : Set up   Lower Body Dressing: Moderate assistance;Sit to/from stand Lower Body Dressing Details (indicate cue type and reason): able to don depends; plans to have family assist with teds/socks Toilet Transfer: Min guard;Ambulation;BSC;RW   Toileting- Marine scientist and Hygiene: Min guard;Sit to/from stand         General ADL Comments: performed LB dressing and ambulated to bathroom with min guard. Pt fatiques quickly.  Needed to sit for oral care. Pt is very independent natured.  Talked to her about energy conservation, pacing herself and not pushing through fatique. Cues for safety, as she released hand from walker prior to turning completely to chair     Vision         Perception     Praxis      Pertinent Vitals/Pain Pain Score: 4  Pain Location: left knee Pain Descriptors / Indicators: Discomfort Pain Intervention(s): Limited activity within patient's tolerance;Monitored during session;Premedicated before session;Repositioned     Hand Dominance     Extremity/Trunk Assessment Upper Extremity Assessment Upper Extremity Assessment: Overall WFL for tasks assessed           Communication Communication Communication: No difficulties   Cognition Arousal/Alertness: Awake/alert Behavior During Therapy: WFL for tasks assessed/performed Overall Cognitive Status: Within Functional Limits for tasks assessed                                     General Comments       Exercises  Shoulder Instructions      Home Living Family/patient expects to be discharged to:: Private residence                   Bathroom Shower/Tub: Tub/shower unit   Bathroom Toilet: Standard         Additional Comments: Pt will stay with son and daughter in law.  Assistance will be limited as daughter in law has 3 children, including 2 y o.  Pt plans to sponge bathe. Family is helping to care for her husband, who requires total A      Prior Functioning/Environment Level of Independence: Independent with assistive device(s)        Comments: Using cane for ambulation, for longer distance ambulation would use RW. Husband is total care, and pt is primary caregiver for husband. Pt requesting rehab for PT.         OT  Problem List: Decreased activity tolerance;Pain;Decreased safety awareness      OT Treatment/Interventions: Self-care/ADL training;DME and/or AE instruction;Patient/family education;Therapeutic activities    OT Goals(Current goals can be found in the care plan section) Acute Rehab OT Goals Patient Stated Goal: to go home, not SNF OT Goal Formulation: With patient Time For Goal Achievement: 10/08/18 Potential to Achieve Goals: Good ADL Goals Pt Will Transfer to Toilet: with modified independence;bedside commode;ambulating Pt Will Perform Toileting - Clothing Manipulation and hygiene: with modified independence;sit to/from stand Additional ADL Goal #1: pt will complete ADL from 3;1 commode in bathroom at mod I level, initiating rest breaks as needed Additional ADL Goal #2: pt will not need any safety cues during adls/bathroom transfers  OT Frequency: Min 3X/week   Barriers to D/C:            Co-evaluation              AM-PAC OT "6 Clicks" Daily Activity     Outcome Measure Help from another person eating meals?: None Help from another person taking care of personal grooming?: A Little Help from another person toileting, which includes using toliet, bedpan, or urinal?: A Little Help from another person bathing (including washing, rinsing, drying)?: A Little Help from another person to put on and taking off regular upper body clothing?: A Little Help from another person to put on and taking off regular lower body clothing?: A Lot 6 Click Score: 18   End of Session    Activity Tolerance: Patient tolerated treatment well Patient left: in chair;with call bell/phone within reach;with chair alarm set  OT Visit Diagnosis: Pain Pain - Right/Left: Left Pain - part of body: Knee                Time: 1497-0263 OT Time Calculation (min): 24 min Charges:  OT General Charges $OT Visit: 1 Visit OT Evaluation $OT Eval Low Complexity: 1 Low OT Treatments $Self Care/Home Management  : 8-22 mins  Lesle Chris, OTR/L Acute Rehabilitation Services [phone] [facility] pager [phone] office 10/01/2018  Arab 10/01/2018, 10:33 AM

## 2018-10-02 ENCOUNTER — Encounter (HOSPITAL_COMMUNITY): Payer: Self-pay | Admitting: Orthopaedic Surgery

## 2018-10-02 DIAGNOSIS — E1122 Type 2 diabetes mellitus with diabetic chronic kidney disease: Secondary | ICD-10-CM | POA: Diagnosis not present

## 2018-10-02 DIAGNOSIS — Z886 Allergy status to analgesic agent status: Secondary | ICD-10-CM | POA: Diagnosis not present

## 2018-10-02 DIAGNOSIS — Z885 Allergy status to narcotic agent status: Secondary | ICD-10-CM | POA: Diagnosis not present

## 2018-10-02 DIAGNOSIS — E039 Hypothyroidism, unspecified: Secondary | ICD-10-CM | POA: Diagnosis not present

## 2018-10-02 DIAGNOSIS — E0520 Thyrotoxicosis with toxic multinodular goiter without thyrotoxic crisis or storm: Secondary | ICD-10-CM | POA: Diagnosis not present

## 2018-10-02 DIAGNOSIS — K219 Gastro-esophageal reflux disease without esophagitis: Secondary | ICD-10-CM | POA: Diagnosis not present

## 2018-10-02 DIAGNOSIS — J45909 Unspecified asthma, uncomplicated: Secondary | ICD-10-CM | POA: Diagnosis not present

## 2018-10-02 DIAGNOSIS — Z833 Family history of diabetes mellitus: Secondary | ICD-10-CM | POA: Diagnosis not present

## 2018-10-02 DIAGNOSIS — Z7982 Long term (current) use of aspirin: Secondary | ICD-10-CM | POA: Diagnosis not present

## 2018-10-02 DIAGNOSIS — Z888 Allergy status to other drugs, medicaments and biological substances status: Secondary | ICD-10-CM | POA: Diagnosis not present

## 2018-10-02 DIAGNOSIS — Z79899 Other long term (current) drug therapy: Secondary | ICD-10-CM | POA: Diagnosis not present

## 2018-10-02 DIAGNOSIS — Z7984 Long term (current) use of oral hypoglycemic drugs: Secondary | ICD-10-CM | POA: Diagnosis not present

## 2018-10-02 DIAGNOSIS — Z791 Long term (current) use of non-steroidal anti-inflammatories (NSAID): Secondary | ICD-10-CM | POA: Diagnosis not present

## 2018-10-02 DIAGNOSIS — N183 Chronic kidney disease, stage 3 (moderate): Secondary | ICD-10-CM | POA: Diagnosis not present

## 2018-10-02 DIAGNOSIS — M1712 Unilateral primary osteoarthritis, left knee: Secondary | ICD-10-CM | POA: Diagnosis not present

## 2018-10-02 DIAGNOSIS — E785 Hyperlipidemia, unspecified: Secondary | ICD-10-CM | POA: Diagnosis not present

## 2018-10-02 DIAGNOSIS — M109 Gout, unspecified: Secondary | ICD-10-CM | POA: Diagnosis not present

## 2018-10-02 DIAGNOSIS — R2681 Unsteadiness on feet: Secondary | ICD-10-CM | POA: Diagnosis not present

## 2018-10-02 DIAGNOSIS — I1310 Hypertensive heart and chronic kidney disease without heart failure, with stage 1 through stage 4 chronic kidney disease, or unspecified chronic kidney disease: Secondary | ICD-10-CM | POA: Diagnosis not present

## 2018-10-02 DIAGNOSIS — Z96651 Presence of right artificial knee joint: Secondary | ICD-10-CM | POA: Diagnosis not present

## 2018-10-02 DIAGNOSIS — Z8249 Family history of ischemic heart disease and other diseases of the circulatory system: Secondary | ICD-10-CM | POA: Diagnosis not present

## 2018-10-02 LAB — GLUCOSE, CAPILLARY
Glucose-Capillary: 89 mg/dL (ref 70–99)
Glucose-Capillary: 91 mg/dL (ref 70–99)

## 2018-10-02 MED ORDER — TIZANIDINE HCL 2 MG PO TABS: 2.0000 mg | ORAL_TABLET | Freq: Four times a day (QID) | ORAL | 1 refills | Status: DC | PRN

## 2018-10-02 MED ORDER — ASPIRIN 325 MG PO TBEC: 325.0000 mg | DELAYED_RELEASE_TABLET | Freq: Two times a day (BID) | ORAL | 0 refills | Status: DC

## 2018-10-02 MED ORDER — OXYCODONE HCL 5 MG PO TABS: 5.0000 mg | ORAL_TABLET | Freq: Four times a day (QID) | ORAL | 0 refills | Status: DC | PRN

## 2018-10-02 NOTE — Progress Notes (Signed)
Subjective: 2 Days Post-Op Procedure(s) (LRB): TOTAL KNEE ARTHROPLASTY (Left)   Patinet doing well. She would like to go home today.  Activity level:  wbat Diet tolerance:  ok Voiding:  ok Patient reports pain as mild.    Objective: Vital signs in last 24 hours: Temp:  [98 F (36.7 C)-99.2 F (37.3 C)] 99.2 F (37.3 C) (03/12 0545) Pulse Rate:  [50-61] 61 (03/12 0545) Resp:  [15-16] 15 (03/12 0545) BP: (108-150)/(57-71) 126/57 (03/12 0545) SpO2:  [96 %-100 %] 100 % (03/12 0545)  Labs: No results for input(s): HGB in the last 72 hours. No results for input(s): WBC, RBC, HCT, PLT in the last 72 hours. No results for input(s): NA, K, CL, CO2, BUN, CREATININE, GLUCOSE, CALCIUM in the last 72 hours. No results for input(s): LABPT, INR in the last 72 hours.  Physical Exam:  Neurologically intact ABD soft Neurovascular intact Sensation intact distally Intact pulses distally Dorsiflexion/Plantar flexion intact Incision: dressing C/D/I and no drainage No cellulitis present Compartment soft  Assessment/Plan:  2 Days Post-Op Procedure(s) (LRB): TOTAL KNEE ARTHROPLASTY (Left) Advance diet Up with therapy Discharge home with home health today after PT if cleared and doing well. Continue on ASA 325mg  BID x 2 weeks post op. Follow up in office 2 weeks post op.  [name] Deneka Greenwalt 10/02/2018, 6:52 AM

## 2018-10-02 NOTE — Plan of Care (Signed)
Patient discharged home in stable condition 

## 2018-10-02 NOTE — Discharge Summary (Signed)
Patient ID: Brooke [name] MRN: [id number] DOB/AGE: 1950/11/19 68 y.o.  Admit date: 09/30/2018 Discharge date: 10/02/2018  Admission Diagnoses:  Principal Problem:   Primary osteoarthritis of left knee   Discharge Diagnoses:  Same  Past Medical History:  Diagnosis Date  . Alopecia   . Aortic atherosclerosis ([place])   . Asthma    FOLLOWED BY PCP  . Eczema   . GERD (gastroesophageal reflux disease)   . Gout    04-28-2018--- per pt stable , as been a while since last episode  . History of colon polyps   . History of syncope 2015  . Hyperlipidemia   . Hypertension   . Hypothyroidism   . OA (osteoarthritis) of knee    bilateral  . PVC's (premature ventricular contractions)   . Toxic multinodular goiter    03/ 2003  s/p RAI  . Type 2 diabetes mellitus ([place])    followed by pcp  . Ventricular tachycardia, polymorphic ([place]) 01/04/2014   primary cardiologist-- dr Tressia Miners turner (hx monitor 2015 showed couplet PVCs, as trigger)  . Wears glasses   . Wears partial dentures    upper    Surgeries: Procedure(s): TOTAL KNEE ARTHROPLASTY on 09/30/2018   Consultants:   Discharged Condition: Improved  Hospital Course: TRAVIA ONSTAD is an 68 y.o. female who was admitted 09/30/2018 for operative treatment ofPrimary osteoarthritis of left knee. Patient has severe unremitting pain that affects sleep, daily activities, and work/hobbies. After pre-op clearance the patient was taken to the operating room on 09/30/2018 and underwent  Procedure(s): TOTAL KNEE ARTHROPLASTY.    Patient was given perioperative antibiotics:  Anti-infectives (From admission, onward)   Start     Dose/Rate Route Frequency Ordered Stop   09/30/18 1600  ceFAZolin (ANCEF) IVPB 2g/100 mL premix     2 g 200 mL/hr over 30 Minutes Intravenous Every 6 hours 09/30/18 1243 09/30/18 2331   09/30/18 0800  ceFAZolin (ANCEF) IVPB 2g/100 mL premix     2 g 200 mL/hr over 30 Minutes Intravenous On call to O.R. 09/30/18 4098 09/30/18  0935       Patient was given sequential compression devices, early ambulation, and chemoprophylaxis to prevent DVT.  Patient benefited maximally from hospital stay and there were no complications.    Recent vital signs:  Patient Vitals for the past 24 hrs:  BP Temp Temp src Pulse Resp SpO2  10/02/18 0545 (!) 126/57 99.2 F (37.3 C) Oral 61 15 100 %  10/01/18 2040 (!) 145/71 98.4 F (36.9 C) Oral (!) 58 16 98 %  10/01/18 1321 108/60 98.4 F (36.9 C) Oral (!) 50 16 96 %  10/01/18 0934 (!) 150/71 98.4 F (36.9 C) Oral (!) 55 16 97 %  10/01/18 0752 140/70 98 F (36.7 C) Oral (!) 51 16 96 %     Recent laboratory studies: No results for input(s): WBC, HGB, HCT, PLT, NA, K, CL, CO2, BUN, CREATININE, GLUCOSE, INR, CALCIUM in the last 72 hours.  Invalid input(s): PT, 2   Discharge Medications:   Allergies as of 10/02/2018      Reactions   Ace Inhibitors Swelling      Codeine Nausea And Vomiting, Rash   Hydrocodone Nausea And Vomiting   Tramadol Nausea And Vomiting   Tylenol [acetaminophen] Itching, Rash      Medication List    STOP taking these medications   ibuprofen 800 MG tablet Commonly known as:  ADVIL,MOTRIN     TAKE these medications   albuterol 108 (  90 Base) MCG/ACT inhaler Commonly known as:  PROVENTIL HFA;VENTOLIN HFA Inhale 2 puffs into the lungs every 6 (six) hours as needed for wheezing.   allopurinol 100 MG tablet Commonly known as:  ZYLOPRIM TAKE 1 TABLET BY MOUTH EVERY DAY   AMBULATORY NON FORMULARY MEDICATION Medication Name: Rolling walker   amLODipine 10 MG tablet Commonly known as:  NORVASC TAKE 1 TABLET BY MOUTH EVERY DAY   aspirin 325 MG EC tablet Take 1 tablet (325 mg total) by mouth 2 (two) times daily after a meal. What changed:    medication strength  how much to take  when to take this   citalopram 20 MG tablet Commonly known as:  CELEXA Take 1 tablet (20 mg total) by mouth daily.   clonazePAM 0.5 MG tablet Commonly known  as:  KlonoPIN Take 1 tablet (0.5 mg total) by mouth 2 (two) times daily as needed (vertigo).   fluticasone 110 MCG/ACT inhaler Commonly known as:  Flovent HFA Inhale 2 puffs into the lungs 2 (two) times daily.   gabapentin 100 MG capsule Commonly known as:  NEURONTIN Take 1 capsule (100 mg total) by mouth at bedtime.   hydrochlorothiazide 25 MG tablet Commonly known as:  HYDRODIURIL TAKE 1 TABLET BY MOUTH EVERY DAY   meclizine 25 MG tablet Commonly known as:  ANTIVERT Take 1 tablet (25 mg total) by mouth 3 (three) times daily as needed for dizziness.   metFORMIN 500 MG 24 hr tablet Commonly known as:  GLUCOPHAGE-XR TAKE ONE TABLET BY MOUTH ONCE DAILY WITH  BREAKFAST What changed:    how much to take  how to take this  when to take this   metoprolol succinate 100 MG 24 hr tablet Commonly known as:  TOPROL-XL Take 100 mg by mouth 2 (two) times daily.   omeprazole 20 MG capsule Commonly known as:  PRILOSEC Take 1 capsule (20 mg total) by mouth 2 (two) times daily.   oxyCODONE 5 MG immediate release tablet Commonly known as:  Oxy IR/ROXICODONE Take 1-2 tablets (5-10 mg total) by mouth every 6 (six) hours as needed for moderate pain or severe pain.   potassium chloride 10 MEQ tablet Commonly known as:  Klor-Con 10 Take 1 tablet (10 mEq total) by mouth 2 (two) times daily.   rosuvastatin 20 MG tablet Commonly known as:  Crestor Take 1 tablet (20 mg total) by mouth daily.   tiZANidine 2 MG tablet Commonly known as:  ZANAFLEX Take 1-2 tablets (2-4 mg total) by mouth every 6 (six) hours as needed for muscle spasms.   zolpidem 10 MG tablet Commonly known as:  AMBIEN Take 1 tablet (10 mg total) by mouth at bedtime as needed for sleep. What changed:  when to take this            Durable Medical Equipment  (From admission, onward)         Start     Ordered   09/30/18 1244  DME Walker rolling  Once    Question:  Patient needs a walker to treat with the  following condition  Answer:  Primary osteoarthritis of left knee   09/30/18 1243   09/30/18 1244  DME 3 n 1  Once     09/30/18 1243   09/30/18 1244  DME Bedside commode  Once    Question:  Patient needs a bedside commode to treat with the following condition  Answer:  Primary osteoarthritis of left knee   09/30/18 1243  Diagnostic Studies: No results found.  Disposition: Discharge disposition: 01-Home or Self Care       Discharge Instructions    Call MD / Call 911   Complete by:  As directed    If you experience chest pain or shortness of breath, CALL 911 and be transported to the hospital emergency room.  If you develope a fever above 101 F, pus (white drainage) or increased drainage or redness at the wound, or calf pain, call your surgeon's office.   Constipation Prevention   Complete by:  As directed    Drink plenty of fluids.  Prune juice may be helpful.  You may use a stool softener, such as Colace (over the counter) 100 mg twice a day.  Use MiraLax (over the counter) for constipation as needed.   Diet - low sodium heart healthy   Complete by:  As directed    Discharge instructions   Complete by:  As directed    INSTRUCTIONS AFTER JOINT REPLACEMENT   Remove items at home which could result in a fall. This includes throw rugs or furniture in walking pathways ICE to the affected joint every three hours while awake for 30 minutes at a time, for at least the first 3-5 days, and then as needed for pain and swelling.  Continue to use ice for pain and swelling. You may notice swelling that will progress down to the foot and ankle.  This is normal after surgery.  Elevate your leg when you are not up walking on it.   Continue to use the breathing machine you got in the hospital (incentive spirometer) which will help keep your temperature down.  It is common for your temperature to cycle up and down following surgery, especially at night when you are not up moving around and  exerting yourself.  The breathing machine keeps your lungs expanded and your temperature down.   DIET:  As you were doing prior to hospitalization, we recommend a well-balanced diet.  DRESSING / WOUND CARE / SHOWERING  You may shower 3 days after surgery, but keep the wounds dry during showering.  You may use an occlusive plastic wrap (Press'n Seal for example), NO SOAKING/SUBMERGING IN THE BATHTUB.  If the bandage gets wet, change with a clean dry gauze.  If the incision gets wet, pat the wound dry with a clean towel.  ACTIVITY  Increase activity slowly as tolerated, but follow the weight bearing instructions below.   No driving for 6 weeks or until further direction given by your physician.  You cannot drive while taking narcotics.  No lifting or carrying greater than 10 lbs. until further directed by your surgeon. Avoid periods of inactivity such as sitting longer than an hour when not asleep. This helps prevent blood clots.  You may return to work once you are authorized by your doctor.     WEIGHT BEARING   Weight bearing as tolerated with assist device (walker, cane, etc) as directed, use it as long as suggested by your surgeon or therapist, typically at least 4-6 weeks.   EXERCISES  Results after joint replacement surgery are often greatly improved when you follow the exercise, range of motion and muscle strengthening exercises prescribed by your doctor. Safety measures are also important to protect the joint from further injury. Any time any of these exercises cause you to have increased pain or swelling, decrease what you are doing until you are comfortable again and then slowly increase them. If you have problems  or questions, call your caregiver or physical therapist for advice.   Rehabilitation is important following a joint replacement. After just a few days of immobilization, the muscles of the leg can become weakened and shrink (atrophy).  These exercises are designed to  build up the tone and strength of the thigh and leg muscles and to improve motion. Often times heat used for twenty to thirty minutes before working out will loosen up your tissues and help with improving the range of motion but do not use heat for the first two weeks following surgery (sometimes heat can increase post-operative swelling).   These exercises can be done on a training (exercise) mat, on the floor, on a table or on a bed. Use whatever works the best and is most comfortable for you.    Use music or television while you are exercising so that the exercises are a pleasant break in your day. This will make your life better with the exercises acting as a break in your routine that you can look forward to.   Perform all exercises about fifteen times, three times per day or as directed.  You should exercise both the operative leg and the other leg as well.   Exercises include:   Quad Sets - Tighten up the muscle on the front of the thigh (Quad) and hold for 5-10 seconds.   Straight Leg Raises - With your knee straight (if you were given a brace, keep it on), lift the leg to 60 degrees, hold for 3 seconds, and slowly lower the leg.  Perform this exercise against resistance later as your leg gets stronger.  Leg Slides: Lying on your back, slowly slide your foot toward your buttocks, bending your knee up off the floor (only go as far as is comfortable). Then slowly slide your foot back down until your leg is flat on the floor again.  Angel Wings: Lying on your back spread your legs to the side as far apart as you can without causing discomfort.  Hamstring Strength:  Lying on your back, push your heel against the floor with your leg straight by tightening up the muscles of your buttocks.  Repeat, but this time bend your knee to a comfortable angle, and push your heel against the floor.  You may put a pillow under the heel to make it more comfortable if necessary.   A rehabilitation program following  joint replacement surgery can speed recovery and prevent re-injury in the future due to weakened muscles. Contact your doctor or a physical therapist for more information on knee rehabilitation.    CONSTIPATION  Constipation is defined medically as fewer than three stools per week and severe constipation as less than one stool per week.  Even if you have a regular bowel pattern at home, your normal regimen is likely to be disrupted due to multiple reasons following surgery.  Combination of anesthesia, postoperative narcotics, change in appetite and fluid intake all can affect your bowels.   YOU MUST use at least one of the following options; they are listed in order of increasing strength to get the job done.  They are all available over the counter, and you may need to use some, POSSIBLY even all of these options:    Drink plenty of fluids (prune juice may be helpful) and high fiber foods Colace 100 mg by mouth twice a day  Senokot for constipation as directed and as needed Dulcolax (bisacodyl), take with full glass of water  Miralax (  polyethylene glycol) once or twice a day as needed.  If you have tried all these things and are unable to have a bowel movement in the first 3-4 days after surgery call either your surgeon or your primary doctor.    If you experience loose stools or diarrhea, hold the medications until you stool forms back up.  If your symptoms do not get better within 1 week or if they get worse, check with your doctor.  If you experience "the worst abdominal pain ever" or develop nausea or vomiting, please contact the office immediately for further recommendations for treatment.   ITCHING:  If you experience itching with your medications, try taking only a single pain pill, or even half a pain pill at a time.  You can also use Benadryl over the counter for itching or also to help with sleep.   TED HOSE STOCKINGS:  Use stockings on both legs until for at least 2 weeks or as  directed by physician office. They may be removed at night for sleeping.  MEDICATIONS:  See your medication summary on the "After Visit Summary" that nursing will review with you.  You may have some home medications which will be placed on hold until you complete the course of blood thinner medication.  It is important for you to complete the blood thinner medication as prescribed.  PRECAUTIONS:  If you experience chest pain or shortness of breath - call 911 immediately for transfer to the hospital emergency department.   If you develop a fever greater that 101 F, purulent drainage from wound, increased redness or drainage from wound, foul odor from the wound/dressing, or calf pain - CONTACT YOUR SURGEON.                                                   FOLLOW-UP APPOINTMENTS:  If you do not already have a post-op appointment, please call the office for an appointment to be seen by your surgeon.  Guidelines for how soon to be seen are listed in your "After Visit Summary", but are typically between 1-4 weeks after surgery.  OTHER INSTRUCTIONS:   Knee Replacement:  Do not place pillow under knee, focus on keeping the knee straight while resting. CPM instructions: 0-90 degrees, 2 hours in the morning, 2 hours in the afternoon, and 2 hours in the evening. Place foam block, curve side up under heel at all times except when in CPM or when walking.  DO NOT modify, tear, cut, or change the foam block in any way.  MAKE SURE YOU:  Understand these instructions.  Get help right away if you are not doing well or get worse.    Thank you for letting us be a part of your medical care team.  It is a privilege we respect greatly.  We hope these instructions will help you stay on track for a fast and full recovery!   Increase activity slowly as tolerated   Complete by:  As directed       Follow-up Information    Melrose Nakayama, MD. Schedule an appointment as soon as possible for a visit in 2 weeks.    Specialty:  Orthopedic Surgery Contact information: [place] [address] [phone]            Signed: [name] Saeed Toren 10/02/2018, 6:57 AM

## 2018-10-02 NOTE — Progress Notes (Signed)
Physical Therapy Treatment Patient Details Name: Brooke [name] MRN: [id number] DOB: 1950/08/30 Today's Date: 10/02/2018    History of Present Illness 68 yo female S/P left TKA on 09/30/18. H/O R TKA 05/05/18, depression, vertigo, Vtach, syncope,DMII, HTN, Renal artery stenosis.    PT Comments    Patient seen for mobility progression and stair navigation instruction. Patient with N&V primary limiting factor with nursing notified. Patient able to ambulate to rehab gym with step to/through pattern with good tolerance to weight bearing. Education on stair navigation to simulate home environment. Handout also provided with patient demonstrating good carryover. Unable to ambulate back to room due to nausea, otherwise making good progress towards goals.    Follow Up Recommendations  Home health PT     Equipment Recommendations  None recommended by PT    Recommendations for Other Services       Precautions / Restrictions Precautions Precautions: Knee;Fall Restrictions Weight Bearing Restrictions: No Other Position/Activity Restrictions: WBAT    Mobility  Bed Mobility               General bed mobility comments: sitting in recliner  Transfers Overall transfer level: Needs assistance Equipment used: Rolling walker (2 wheeled) Transfers: Sit to/from Stand Sit to Stand: Min guard         General transfer comment: from recliner and mat table; cueing to push up from seated surface  Ambulation/Gait Ambulation/Gait assistance: Min guard Gait Distance (Feet): 180 Feet Assistive device: Rolling walker (2 wheeled) Gait Pattern/deviations: Step-to pattern;Step-through pattern;Decreased stride length;Decreased weight shift to left Gait velocity: decreased   General Gait Details: cueing to promote step through pattern with patient able to adopt. Patient limited by nausea   Stairs Stairs: Yes Stairs assistance: Min guard Stair Management: No rails;Step to  pattern;Backwards;With walker Number of Stairs: 3 General stair comments: backwards with walker as she has no handrails at home; handout provided for carryover as well as education for assistance at front of walker to prevent fall   Wheelchair Mobility    Modified Rankin (Stroke Patients Only)       Balance Overall balance assessment: Mild deficits observed, not formally tested                                          Cognition Arousal/Alertness: Awake/alert Behavior During Therapy: WFL for tasks assessed/performed Overall Cognitive Status: Within Functional Limits for tasks assessed                                        Exercises Total Joint Exercises Ankle Circles/Pumps: AROM;Both;10 reps;Seated Long Arc Quad: AAROM;Left;10 reps;Seated Marching in Standing: AROM;Left;10 reps;Seated    General Comments General comments (skin integrity, edema, etc.): patient with 2 bouts of N&V - nurse gave Zofran before session - nursing notified      Pertinent Vitals/Pain Pain Assessment: 0-10 Pain Score: 4  Pain Location: left knee Pain Descriptors / Indicators: Aching;Discomfort;Guarding Pain Intervention(s): Limited activity within patient's tolerance;Monitored during session;Premedicated before session;Repositioned;Ice applied    Home Living                      Prior Function            PT Goals (current goals can now be found in the care plan section) Acute  Rehab PT Goals Patient Stated Goal: to go home, not SNF PT Goal Formulation: With patient Time For Goal Achievement: 10/07/18 Potential to Achieve Goals: Good Progress towards PT goals: Progressing toward goals    Frequency    7X/week      PT Plan Current plan remains appropriate    Co-evaluation              AM-PAC PT "6 Clicks" Mobility   Outcome Measure  Help needed turning from your back to your side while in a flat bed without using bedrails?: A  Little Help needed moving from lying on your back to sitting on the side of a flat bed without using bedrails?: A Little Help needed moving to and from a bed to a chair (including a wheelchair)?: A Little Help needed standing up from a chair using your arms (e.g., wheelchair or bedside chair)?: A Little Help needed to walk in hospital room?: A Little Help needed climbing 3-5 steps with a railing? : A Little 6 Click Score: 18    End of Session Equipment Utilized During Treatment: Gait belt Activity Tolerance: Patient tolerated treatment well Patient left: in chair;with call bell/phone within reach Nurse Communication: Mobility status PT Visit Diagnosis: Unsteadiness on feet (R26.81);Pain Pain - Right/Left: Left Pain - part of body: Knee     Time: 0912-0942 PT Time Calculation (min) (ACUTE ONLY): 30 min  Charges:  $Gait Training: 8-22 mins $Therapeutic Exercise: 8-22 mins                     Lanney Gins, PT, DPT Supplemental Physical Therapist 10/02/18 10:28 AM Pager: [phone] Office: [phone]

## 2018-10-03 ENCOUNTER — Telehealth: Payer: Self-pay | Admitting: *Deleted

## 2018-10-03 DIAGNOSIS — E1122 Type 2 diabetes mellitus with diabetic chronic kidney disease: Secondary | ICD-10-CM | POA: Diagnosis not present

## 2018-10-03 DIAGNOSIS — J45909 Unspecified asthma, uncomplicated: Secondary | ICD-10-CM | POA: Diagnosis not present

## 2018-10-03 DIAGNOSIS — E785 Hyperlipidemia, unspecified: Secondary | ICD-10-CM | POA: Diagnosis not present

## 2018-10-03 DIAGNOSIS — Z86010 Personal history of colonic polyps: Secondary | ICD-10-CM | POA: Diagnosis not present

## 2018-10-03 DIAGNOSIS — D631 Anemia in chronic kidney disease: Secondary | ICD-10-CM | POA: Diagnosis not present

## 2018-10-03 DIAGNOSIS — Z7982 Long term (current) use of aspirin: Secondary | ICD-10-CM | POA: Diagnosis not present

## 2018-10-03 DIAGNOSIS — Z471 Aftercare following joint replacement surgery: Secondary | ICD-10-CM | POA: Diagnosis not present

## 2018-10-03 DIAGNOSIS — M109 Gout, unspecified: Secondary | ICD-10-CM | POA: Diagnosis not present

## 2018-10-03 DIAGNOSIS — E049 Nontoxic goiter, unspecified: Secondary | ICD-10-CM | POA: Diagnosis not present

## 2018-10-03 DIAGNOSIS — Z9181 History of falling: Secondary | ICD-10-CM | POA: Diagnosis not present

## 2018-10-03 DIAGNOSIS — N183 Chronic kidney disease, stage 3 (moderate): Secondary | ICD-10-CM | POA: Diagnosis not present

## 2018-10-03 DIAGNOSIS — Z96653 Presence of artificial knee joint, bilateral: Secondary | ICD-10-CM | POA: Diagnosis not present

## 2018-10-03 DIAGNOSIS — G4700 Insomnia, unspecified: Secondary | ICD-10-CM | POA: Diagnosis not present

## 2018-10-03 DIAGNOSIS — I472 Ventricular tachycardia: Secondary | ICD-10-CM | POA: Diagnosis not present

## 2018-10-03 DIAGNOSIS — K219 Gastro-esophageal reflux disease without esophagitis: Secondary | ICD-10-CM | POA: Diagnosis not present

## 2018-10-03 DIAGNOSIS — I129 Hypertensive chronic kidney disease with stage 1 through stage 4 chronic kidney disease, or unspecified chronic kidney disease: Secondary | ICD-10-CM | POA: Diagnosis not present

## 2018-10-03 DIAGNOSIS — Z7984 Long term (current) use of oral hypoglycemic drugs: Secondary | ICD-10-CM | POA: Diagnosis not present

## 2018-10-03 NOTE — Telephone Encounter (Signed)
Pt was on TCM report admitted 09/30/18 for Primary osteoarthritis of left knee. After preoperative pt underwent a (L) TOTAL KNEE ARTHROPLASTY.  Pt tolerated procedure well and was D/C 10/02/18, and will follow-up w/surgeon Melrose Nakayama, MD in 2 weeks.Marland KitchenJohny Chess

## 2018-10-06 DIAGNOSIS — K219 Gastro-esophageal reflux disease without esophagitis: Secondary | ICD-10-CM | POA: Diagnosis not present

## 2018-10-06 DIAGNOSIS — G4700 Insomnia, unspecified: Secondary | ICD-10-CM | POA: Diagnosis not present

## 2018-10-06 DIAGNOSIS — M109 Gout, unspecified: Secondary | ICD-10-CM | POA: Diagnosis not present

## 2018-10-06 DIAGNOSIS — E1122 Type 2 diabetes mellitus with diabetic chronic kidney disease: Secondary | ICD-10-CM | POA: Diagnosis not present

## 2018-10-06 DIAGNOSIS — N183 Chronic kidney disease, stage 3 (moderate): Secondary | ICD-10-CM | POA: Diagnosis not present

## 2018-10-06 DIAGNOSIS — Z9181 History of falling: Secondary | ICD-10-CM | POA: Diagnosis not present

## 2018-10-06 DIAGNOSIS — Z471 Aftercare following joint replacement surgery: Secondary | ICD-10-CM | POA: Diagnosis not present

## 2018-10-06 DIAGNOSIS — D631 Anemia in chronic kidney disease: Secondary | ICD-10-CM | POA: Diagnosis not present

## 2018-10-06 DIAGNOSIS — I129 Hypertensive chronic kidney disease with stage 1 through stage 4 chronic kidney disease, or unspecified chronic kidney disease: Secondary | ICD-10-CM | POA: Diagnosis not present

## 2018-10-06 DIAGNOSIS — Z86010 Personal history of colonic polyps: Secondary | ICD-10-CM | POA: Diagnosis not present

## 2018-10-06 DIAGNOSIS — J45909 Unspecified asthma, uncomplicated: Secondary | ICD-10-CM | POA: Diagnosis not present

## 2018-10-06 DIAGNOSIS — Z7984 Long term (current) use of oral hypoglycemic drugs: Secondary | ICD-10-CM | POA: Diagnosis not present

## 2018-10-06 DIAGNOSIS — Z7982 Long term (current) use of aspirin: Secondary | ICD-10-CM | POA: Diagnosis not present

## 2018-10-06 DIAGNOSIS — E049 Nontoxic goiter, unspecified: Secondary | ICD-10-CM | POA: Diagnosis not present

## 2018-10-06 DIAGNOSIS — Z96653 Presence of artificial knee joint, bilateral: Secondary | ICD-10-CM | POA: Diagnosis not present

## 2018-10-06 DIAGNOSIS — I472 Ventricular tachycardia: Secondary | ICD-10-CM | POA: Diagnosis not present

## 2018-10-06 DIAGNOSIS — E785 Hyperlipidemia, unspecified: Secondary | ICD-10-CM | POA: Diagnosis not present

## 2018-10-08 DIAGNOSIS — J45909 Unspecified asthma, uncomplicated: Secondary | ICD-10-CM | POA: Diagnosis not present

## 2018-10-08 DIAGNOSIS — Z9181 History of falling: Secondary | ICD-10-CM | POA: Diagnosis not present

## 2018-10-08 DIAGNOSIS — K219 Gastro-esophageal reflux disease without esophagitis: Secondary | ICD-10-CM | POA: Diagnosis not present

## 2018-10-08 DIAGNOSIS — E785 Hyperlipidemia, unspecified: Secondary | ICD-10-CM | POA: Diagnosis not present

## 2018-10-08 DIAGNOSIS — G4700 Insomnia, unspecified: Secondary | ICD-10-CM | POA: Diagnosis not present

## 2018-10-08 DIAGNOSIS — Z86010 Personal history of colonic polyps: Secondary | ICD-10-CM | POA: Diagnosis not present

## 2018-10-08 DIAGNOSIS — I472 Ventricular tachycardia: Secondary | ICD-10-CM | POA: Diagnosis not present

## 2018-10-08 DIAGNOSIS — E049 Nontoxic goiter, unspecified: Secondary | ICD-10-CM | POA: Diagnosis not present

## 2018-10-08 DIAGNOSIS — D631 Anemia in chronic kidney disease: Secondary | ICD-10-CM | POA: Diagnosis not present

## 2018-10-08 DIAGNOSIS — N183 Chronic kidney disease, stage 3 (moderate): Secondary | ICD-10-CM | POA: Diagnosis not present

## 2018-10-08 DIAGNOSIS — Z7984 Long term (current) use of oral hypoglycemic drugs: Secondary | ICD-10-CM | POA: Diagnosis not present

## 2018-10-08 DIAGNOSIS — E1122 Type 2 diabetes mellitus with diabetic chronic kidney disease: Secondary | ICD-10-CM | POA: Diagnosis not present

## 2018-10-08 DIAGNOSIS — Z471 Aftercare following joint replacement surgery: Secondary | ICD-10-CM | POA: Diagnosis not present

## 2018-10-08 DIAGNOSIS — Z7982 Long term (current) use of aspirin: Secondary | ICD-10-CM | POA: Diagnosis not present

## 2018-10-08 DIAGNOSIS — M109 Gout, unspecified: Secondary | ICD-10-CM | POA: Diagnosis not present

## 2018-10-08 DIAGNOSIS — I129 Hypertensive chronic kidney disease with stage 1 through stage 4 chronic kidney disease, or unspecified chronic kidney disease: Secondary | ICD-10-CM | POA: Diagnosis not present

## 2018-10-08 DIAGNOSIS — Z96653 Presence of artificial knee joint, bilateral: Secondary | ICD-10-CM | POA: Diagnosis not present

## 2018-10-09 DIAGNOSIS — Z9181 History of falling: Secondary | ICD-10-CM | POA: Diagnosis not present

## 2018-10-09 DIAGNOSIS — Z96653 Presence of artificial knee joint, bilateral: Secondary | ICD-10-CM | POA: Diagnosis not present

## 2018-10-09 DIAGNOSIS — I129 Hypertensive chronic kidney disease with stage 1 through stage 4 chronic kidney disease, or unspecified chronic kidney disease: Secondary | ICD-10-CM | POA: Diagnosis not present

## 2018-10-09 DIAGNOSIS — E049 Nontoxic goiter, unspecified: Secondary | ICD-10-CM | POA: Diagnosis not present

## 2018-10-09 DIAGNOSIS — Z471 Aftercare following joint replacement surgery: Secondary | ICD-10-CM | POA: Diagnosis not present

## 2018-10-09 DIAGNOSIS — Z7982 Long term (current) use of aspirin: Secondary | ICD-10-CM | POA: Diagnosis not present

## 2018-10-09 DIAGNOSIS — G4700 Insomnia, unspecified: Secondary | ICD-10-CM | POA: Diagnosis not present

## 2018-10-09 DIAGNOSIS — K219 Gastro-esophageal reflux disease without esophagitis: Secondary | ICD-10-CM | POA: Diagnosis not present

## 2018-10-09 DIAGNOSIS — J45909 Unspecified asthma, uncomplicated: Secondary | ICD-10-CM | POA: Diagnosis not present

## 2018-10-09 DIAGNOSIS — I472 Ventricular tachycardia: Secondary | ICD-10-CM | POA: Diagnosis not present

## 2018-10-09 DIAGNOSIS — Z7984 Long term (current) use of oral hypoglycemic drugs: Secondary | ICD-10-CM | POA: Diagnosis not present

## 2018-10-09 DIAGNOSIS — N183 Chronic kidney disease, stage 3 (moderate): Secondary | ICD-10-CM | POA: Diagnosis not present

## 2018-10-09 DIAGNOSIS — Z86010 Personal history of colonic polyps: Secondary | ICD-10-CM | POA: Diagnosis not present

## 2018-10-09 DIAGNOSIS — E1122 Type 2 diabetes mellitus with diabetic chronic kidney disease: Secondary | ICD-10-CM | POA: Diagnosis not present

## 2018-10-09 DIAGNOSIS — E785 Hyperlipidemia, unspecified: Secondary | ICD-10-CM | POA: Diagnosis not present

## 2018-10-09 DIAGNOSIS — M109 Gout, unspecified: Secondary | ICD-10-CM | POA: Diagnosis not present

## 2018-10-09 DIAGNOSIS — D631 Anemia in chronic kidney disease: Secondary | ICD-10-CM | POA: Diagnosis not present

## 2018-10-10 DIAGNOSIS — M25562 Pain in left knee: Secondary | ICD-10-CM | POA: Diagnosis not present

## 2018-10-13 DIAGNOSIS — G4700 Insomnia, unspecified: Secondary | ICD-10-CM | POA: Diagnosis not present

## 2018-10-13 DIAGNOSIS — Z7982 Long term (current) use of aspirin: Secondary | ICD-10-CM | POA: Diagnosis not present

## 2018-10-13 DIAGNOSIS — K219 Gastro-esophageal reflux disease without esophagitis: Secondary | ICD-10-CM | POA: Diagnosis not present

## 2018-10-13 DIAGNOSIS — Z471 Aftercare following joint replacement surgery: Secondary | ICD-10-CM | POA: Diagnosis not present

## 2018-10-13 DIAGNOSIS — Z9181 History of falling: Secondary | ICD-10-CM | POA: Diagnosis not present

## 2018-10-13 DIAGNOSIS — I472 Ventricular tachycardia: Secondary | ICD-10-CM | POA: Diagnosis not present

## 2018-10-13 DIAGNOSIS — I129 Hypertensive chronic kidney disease with stage 1 through stage 4 chronic kidney disease, or unspecified chronic kidney disease: Secondary | ICD-10-CM | POA: Diagnosis not present

## 2018-10-13 DIAGNOSIS — Z7984 Long term (current) use of oral hypoglycemic drugs: Secondary | ICD-10-CM | POA: Diagnosis not present

## 2018-10-13 DIAGNOSIS — E785 Hyperlipidemia, unspecified: Secondary | ICD-10-CM | POA: Diagnosis not present

## 2018-10-13 DIAGNOSIS — J45909 Unspecified asthma, uncomplicated: Secondary | ICD-10-CM | POA: Diagnosis not present

## 2018-10-13 DIAGNOSIS — Z86010 Personal history of colonic polyps: Secondary | ICD-10-CM | POA: Diagnosis not present

## 2018-10-13 DIAGNOSIS — E1122 Type 2 diabetes mellitus with diabetic chronic kidney disease: Secondary | ICD-10-CM | POA: Diagnosis not present

## 2018-10-13 DIAGNOSIS — Z96653 Presence of artificial knee joint, bilateral: Secondary | ICD-10-CM | POA: Diagnosis not present

## 2018-10-13 DIAGNOSIS — N183 Chronic kidney disease, stage 3 (moderate): Secondary | ICD-10-CM | POA: Diagnosis not present

## 2018-10-13 DIAGNOSIS — M109 Gout, unspecified: Secondary | ICD-10-CM | POA: Diagnosis not present

## 2018-10-13 DIAGNOSIS — D631 Anemia in chronic kidney disease: Secondary | ICD-10-CM | POA: Diagnosis not present

## 2018-10-13 DIAGNOSIS — E049 Nontoxic goiter, unspecified: Secondary | ICD-10-CM | POA: Diagnosis not present

## 2018-10-15 DIAGNOSIS — I472 Ventricular tachycardia: Secondary | ICD-10-CM | POA: Diagnosis not present

## 2018-10-15 DIAGNOSIS — M109 Gout, unspecified: Secondary | ICD-10-CM | POA: Diagnosis not present

## 2018-10-15 DIAGNOSIS — Z7982 Long term (current) use of aspirin: Secondary | ICD-10-CM | POA: Diagnosis not present

## 2018-10-15 DIAGNOSIS — K219 Gastro-esophageal reflux disease without esophagitis: Secondary | ICD-10-CM | POA: Diagnosis not present

## 2018-10-15 DIAGNOSIS — J45909 Unspecified asthma, uncomplicated: Secondary | ICD-10-CM | POA: Diagnosis not present

## 2018-10-15 DIAGNOSIS — E049 Nontoxic goiter, unspecified: Secondary | ICD-10-CM | POA: Diagnosis not present

## 2018-10-15 DIAGNOSIS — Z471 Aftercare following joint replacement surgery: Secondary | ICD-10-CM | POA: Diagnosis not present

## 2018-10-15 DIAGNOSIS — Z86010 Personal history of colonic polyps: Secondary | ICD-10-CM | POA: Diagnosis not present

## 2018-10-15 DIAGNOSIS — E1122 Type 2 diabetes mellitus with diabetic chronic kidney disease: Secondary | ICD-10-CM | POA: Diagnosis not present

## 2018-10-15 DIAGNOSIS — N183 Chronic kidney disease, stage 3 (moderate): Secondary | ICD-10-CM | POA: Diagnosis not present

## 2018-10-15 DIAGNOSIS — D631 Anemia in chronic kidney disease: Secondary | ICD-10-CM | POA: Diagnosis not present

## 2018-10-15 DIAGNOSIS — G4700 Insomnia, unspecified: Secondary | ICD-10-CM | POA: Diagnosis not present

## 2018-10-15 DIAGNOSIS — I129 Hypertensive chronic kidney disease with stage 1 through stage 4 chronic kidney disease, or unspecified chronic kidney disease: Secondary | ICD-10-CM | POA: Diagnosis not present

## 2018-10-15 DIAGNOSIS — Z7984 Long term (current) use of oral hypoglycemic drugs: Secondary | ICD-10-CM | POA: Diagnosis not present

## 2018-10-15 DIAGNOSIS — Z9181 History of falling: Secondary | ICD-10-CM | POA: Diagnosis not present

## 2018-10-15 DIAGNOSIS — Z96653 Presence of artificial knee joint, bilateral: Secondary | ICD-10-CM | POA: Diagnosis not present

## 2018-10-15 DIAGNOSIS — E785 Hyperlipidemia, unspecified: Secondary | ICD-10-CM | POA: Diagnosis not present

## 2018-10-17 ENCOUNTER — Other Ambulatory Visit: Payer: Self-pay | Admitting: Internal Medicine

## 2018-10-17 DIAGNOSIS — D631 Anemia in chronic kidney disease: Secondary | ICD-10-CM | POA: Diagnosis not present

## 2018-10-17 DIAGNOSIS — E785 Hyperlipidemia, unspecified: Secondary | ICD-10-CM | POA: Diagnosis not present

## 2018-10-17 DIAGNOSIS — Z96653 Presence of artificial knee joint, bilateral: Secondary | ICD-10-CM | POA: Diagnosis not present

## 2018-10-17 DIAGNOSIS — G4700 Insomnia, unspecified: Secondary | ICD-10-CM | POA: Diagnosis not present

## 2018-10-17 DIAGNOSIS — Z86010 Personal history of colonic polyps: Secondary | ICD-10-CM | POA: Diagnosis not present

## 2018-10-17 DIAGNOSIS — K219 Gastro-esophageal reflux disease without esophagitis: Secondary | ICD-10-CM | POA: Diagnosis not present

## 2018-10-17 DIAGNOSIS — E049 Nontoxic goiter, unspecified: Secondary | ICD-10-CM | POA: Diagnosis not present

## 2018-10-17 DIAGNOSIS — E1122 Type 2 diabetes mellitus with diabetic chronic kidney disease: Secondary | ICD-10-CM | POA: Diagnosis not present

## 2018-10-17 DIAGNOSIS — I129 Hypertensive chronic kidney disease with stage 1 through stage 4 chronic kidney disease, or unspecified chronic kidney disease: Secondary | ICD-10-CM | POA: Diagnosis not present

## 2018-10-17 DIAGNOSIS — J45909 Unspecified asthma, uncomplicated: Secondary | ICD-10-CM | POA: Diagnosis not present

## 2018-10-17 DIAGNOSIS — Z9181 History of falling: Secondary | ICD-10-CM | POA: Diagnosis not present

## 2018-10-17 DIAGNOSIS — Z7982 Long term (current) use of aspirin: Secondary | ICD-10-CM | POA: Diagnosis not present

## 2018-10-17 DIAGNOSIS — Z471 Aftercare following joint replacement surgery: Secondary | ICD-10-CM | POA: Diagnosis not present

## 2018-10-17 DIAGNOSIS — M109 Gout, unspecified: Secondary | ICD-10-CM | POA: Diagnosis not present

## 2018-10-17 DIAGNOSIS — Z7984 Long term (current) use of oral hypoglycemic drugs: Secondary | ICD-10-CM | POA: Diagnosis not present

## 2018-10-17 DIAGNOSIS — I472 Ventricular tachycardia: Secondary | ICD-10-CM | POA: Diagnosis not present

## 2018-10-17 DIAGNOSIS — N183 Chronic kidney disease, stage 3 (moderate): Secondary | ICD-10-CM | POA: Diagnosis not present

## 2018-10-20 DIAGNOSIS — I472 Ventricular tachycardia: Secondary | ICD-10-CM | POA: Diagnosis not present

## 2018-10-20 DIAGNOSIS — Z9181 History of falling: Secondary | ICD-10-CM | POA: Diagnosis not present

## 2018-10-20 DIAGNOSIS — Z471 Aftercare following joint replacement surgery: Secondary | ICD-10-CM | POA: Diagnosis not present

## 2018-10-20 DIAGNOSIS — K219 Gastro-esophageal reflux disease without esophagitis: Secondary | ICD-10-CM | POA: Diagnosis not present

## 2018-10-20 DIAGNOSIS — E785 Hyperlipidemia, unspecified: Secondary | ICD-10-CM | POA: Diagnosis not present

## 2018-10-20 DIAGNOSIS — Z96653 Presence of artificial knee joint, bilateral: Secondary | ICD-10-CM | POA: Diagnosis not present

## 2018-10-20 DIAGNOSIS — M109 Gout, unspecified: Secondary | ICD-10-CM | POA: Diagnosis not present

## 2018-10-20 DIAGNOSIS — E1122 Type 2 diabetes mellitus with diabetic chronic kidney disease: Secondary | ICD-10-CM | POA: Diagnosis not present

## 2018-10-20 DIAGNOSIS — E049 Nontoxic goiter, unspecified: Secondary | ICD-10-CM | POA: Diagnosis not present

## 2018-10-20 DIAGNOSIS — D631 Anemia in chronic kidney disease: Secondary | ICD-10-CM | POA: Diagnosis not present

## 2018-10-20 DIAGNOSIS — Z7982 Long term (current) use of aspirin: Secondary | ICD-10-CM | POA: Diagnosis not present

## 2018-10-20 DIAGNOSIS — N183 Chronic kidney disease, stage 3 (moderate): Secondary | ICD-10-CM | POA: Diagnosis not present

## 2018-10-20 DIAGNOSIS — G4700 Insomnia, unspecified: Secondary | ICD-10-CM | POA: Diagnosis not present

## 2018-10-20 DIAGNOSIS — Z7984 Long term (current) use of oral hypoglycemic drugs: Secondary | ICD-10-CM | POA: Diagnosis not present

## 2018-10-20 DIAGNOSIS — J45909 Unspecified asthma, uncomplicated: Secondary | ICD-10-CM | POA: Diagnosis not present

## 2018-10-20 DIAGNOSIS — Z86010 Personal history of colonic polyps: Secondary | ICD-10-CM | POA: Diagnosis not present

## 2018-10-20 DIAGNOSIS — I129 Hypertensive chronic kidney disease with stage 1 through stage 4 chronic kidney disease, or unspecified chronic kidney disease: Secondary | ICD-10-CM | POA: Diagnosis not present

## 2018-10-22 DIAGNOSIS — Z471 Aftercare following joint replacement surgery: Secondary | ICD-10-CM | POA: Diagnosis not present

## 2018-10-22 DIAGNOSIS — M109 Gout, unspecified: Secondary | ICD-10-CM | POA: Diagnosis not present

## 2018-10-22 DIAGNOSIS — J45909 Unspecified asthma, uncomplicated: Secondary | ICD-10-CM | POA: Diagnosis not present

## 2018-10-22 DIAGNOSIS — E049 Nontoxic goiter, unspecified: Secondary | ICD-10-CM | POA: Diagnosis not present

## 2018-10-22 DIAGNOSIS — D631 Anemia in chronic kidney disease: Secondary | ICD-10-CM | POA: Diagnosis not present

## 2018-10-22 DIAGNOSIS — Z9181 History of falling: Secondary | ICD-10-CM | POA: Diagnosis not present

## 2018-10-22 DIAGNOSIS — K219 Gastro-esophageal reflux disease without esophagitis: Secondary | ICD-10-CM | POA: Diagnosis not present

## 2018-10-22 DIAGNOSIS — N183 Chronic kidney disease, stage 3 (moderate): Secondary | ICD-10-CM | POA: Diagnosis not present

## 2018-10-22 DIAGNOSIS — E785 Hyperlipidemia, unspecified: Secondary | ICD-10-CM | POA: Diagnosis not present

## 2018-10-22 DIAGNOSIS — E1122 Type 2 diabetes mellitus with diabetic chronic kidney disease: Secondary | ICD-10-CM | POA: Diagnosis not present

## 2018-10-22 DIAGNOSIS — Z96653 Presence of artificial knee joint, bilateral: Secondary | ICD-10-CM | POA: Diagnosis not present

## 2018-10-22 DIAGNOSIS — I129 Hypertensive chronic kidney disease with stage 1 through stage 4 chronic kidney disease, or unspecified chronic kidney disease: Secondary | ICD-10-CM | POA: Diagnosis not present

## 2018-10-22 DIAGNOSIS — G4700 Insomnia, unspecified: Secondary | ICD-10-CM | POA: Diagnosis not present

## 2018-10-22 DIAGNOSIS — Z7982 Long term (current) use of aspirin: Secondary | ICD-10-CM | POA: Diagnosis not present

## 2018-10-22 DIAGNOSIS — Z7984 Long term (current) use of oral hypoglycemic drugs: Secondary | ICD-10-CM | POA: Diagnosis not present

## 2018-10-22 DIAGNOSIS — I472 Ventricular tachycardia: Secondary | ICD-10-CM | POA: Diagnosis not present

## 2018-10-22 DIAGNOSIS — Z86010 Personal history of colonic polyps: Secondary | ICD-10-CM | POA: Diagnosis not present

## 2018-10-24 DIAGNOSIS — K219 Gastro-esophageal reflux disease without esophagitis: Secondary | ICD-10-CM | POA: Diagnosis not present

## 2018-10-24 DIAGNOSIS — Z9181 History of falling: Secondary | ICD-10-CM | POA: Diagnosis not present

## 2018-10-24 DIAGNOSIS — M109 Gout, unspecified: Secondary | ICD-10-CM | POA: Diagnosis not present

## 2018-10-24 DIAGNOSIS — D631 Anemia in chronic kidney disease: Secondary | ICD-10-CM | POA: Diagnosis not present

## 2018-10-24 DIAGNOSIS — G4700 Insomnia, unspecified: Secondary | ICD-10-CM | POA: Diagnosis not present

## 2018-10-24 DIAGNOSIS — J45909 Unspecified asthma, uncomplicated: Secondary | ICD-10-CM | POA: Diagnosis not present

## 2018-10-24 DIAGNOSIS — Z7984 Long term (current) use of oral hypoglycemic drugs: Secondary | ICD-10-CM | POA: Diagnosis not present

## 2018-10-24 DIAGNOSIS — E1122 Type 2 diabetes mellitus with diabetic chronic kidney disease: Secondary | ICD-10-CM | POA: Diagnosis not present

## 2018-10-24 DIAGNOSIS — Z96653 Presence of artificial knee joint, bilateral: Secondary | ICD-10-CM | POA: Diagnosis not present

## 2018-10-24 DIAGNOSIS — I472 Ventricular tachycardia: Secondary | ICD-10-CM | POA: Diagnosis not present

## 2018-10-24 DIAGNOSIS — I129 Hypertensive chronic kidney disease with stage 1 through stage 4 chronic kidney disease, or unspecified chronic kidney disease: Secondary | ICD-10-CM | POA: Diagnosis not present

## 2018-10-24 DIAGNOSIS — Z471 Aftercare following joint replacement surgery: Secondary | ICD-10-CM | POA: Diagnosis not present

## 2018-10-24 DIAGNOSIS — Z86010 Personal history of colonic polyps: Secondary | ICD-10-CM | POA: Diagnosis not present

## 2018-10-24 DIAGNOSIS — N183 Chronic kidney disease, stage 3 (moderate): Secondary | ICD-10-CM | POA: Diagnosis not present

## 2018-10-24 DIAGNOSIS — E049 Nontoxic goiter, unspecified: Secondary | ICD-10-CM | POA: Diagnosis not present

## 2018-10-24 DIAGNOSIS — E785 Hyperlipidemia, unspecified: Secondary | ICD-10-CM | POA: Diagnosis not present

## 2018-10-24 DIAGNOSIS — Z7982 Long term (current) use of aspirin: Secondary | ICD-10-CM | POA: Diagnosis not present

## 2018-10-28 DIAGNOSIS — Z471 Aftercare following joint replacement surgery: Secondary | ICD-10-CM | POA: Diagnosis not present

## 2018-10-28 DIAGNOSIS — E049 Nontoxic goiter, unspecified: Secondary | ICD-10-CM | POA: Diagnosis not present

## 2018-10-28 DIAGNOSIS — G4700 Insomnia, unspecified: Secondary | ICD-10-CM | POA: Diagnosis not present

## 2018-10-28 DIAGNOSIS — Z86010 Personal history of colonic polyps: Secondary | ICD-10-CM | POA: Diagnosis not present

## 2018-10-28 DIAGNOSIS — I472 Ventricular tachycardia: Secondary | ICD-10-CM | POA: Diagnosis not present

## 2018-10-28 DIAGNOSIS — K219 Gastro-esophageal reflux disease without esophagitis: Secondary | ICD-10-CM | POA: Diagnosis not present

## 2018-10-28 DIAGNOSIS — I129 Hypertensive chronic kidney disease with stage 1 through stage 4 chronic kidney disease, or unspecified chronic kidney disease: Secondary | ICD-10-CM | POA: Diagnosis not present

## 2018-10-28 DIAGNOSIS — D631 Anemia in chronic kidney disease: Secondary | ICD-10-CM | POA: Diagnosis not present

## 2018-10-28 DIAGNOSIS — J45909 Unspecified asthma, uncomplicated: Secondary | ICD-10-CM | POA: Diagnosis not present

## 2018-10-28 DIAGNOSIS — M109 Gout, unspecified: Secondary | ICD-10-CM | POA: Diagnosis not present

## 2018-10-28 DIAGNOSIS — Z96653 Presence of artificial knee joint, bilateral: Secondary | ICD-10-CM | POA: Diagnosis not present

## 2018-10-28 DIAGNOSIS — Z9181 History of falling: Secondary | ICD-10-CM | POA: Diagnosis not present

## 2018-10-28 DIAGNOSIS — Z7982 Long term (current) use of aspirin: Secondary | ICD-10-CM | POA: Diagnosis not present

## 2018-10-28 DIAGNOSIS — E785 Hyperlipidemia, unspecified: Secondary | ICD-10-CM | POA: Diagnosis not present

## 2018-10-28 DIAGNOSIS — N183 Chronic kidney disease, stage 3 (moderate): Secondary | ICD-10-CM | POA: Diagnosis not present

## 2018-10-28 DIAGNOSIS — E1122 Type 2 diabetes mellitus with diabetic chronic kidney disease: Secondary | ICD-10-CM | POA: Diagnosis not present

## 2018-10-28 DIAGNOSIS — Z7984 Long term (current) use of oral hypoglycemic drugs: Secondary | ICD-10-CM | POA: Diagnosis not present

## 2018-10-29 DIAGNOSIS — Z7984 Long term (current) use of oral hypoglycemic drugs: Secondary | ICD-10-CM | POA: Diagnosis not present

## 2018-10-29 DIAGNOSIS — N183 Chronic kidney disease, stage 3 (moderate): Secondary | ICD-10-CM | POA: Diagnosis not present

## 2018-10-29 DIAGNOSIS — Z7982 Long term (current) use of aspirin: Secondary | ICD-10-CM | POA: Diagnosis not present

## 2018-10-29 DIAGNOSIS — J45909 Unspecified asthma, uncomplicated: Secondary | ICD-10-CM | POA: Diagnosis not present

## 2018-10-29 DIAGNOSIS — E049 Nontoxic goiter, unspecified: Secondary | ICD-10-CM | POA: Diagnosis not present

## 2018-10-29 DIAGNOSIS — D631 Anemia in chronic kidney disease: Secondary | ICD-10-CM | POA: Diagnosis not present

## 2018-10-29 DIAGNOSIS — I472 Ventricular tachycardia: Secondary | ICD-10-CM | POA: Diagnosis not present

## 2018-10-29 DIAGNOSIS — Z86010 Personal history of colonic polyps: Secondary | ICD-10-CM | POA: Diagnosis not present

## 2018-10-29 DIAGNOSIS — E785 Hyperlipidemia, unspecified: Secondary | ICD-10-CM | POA: Diagnosis not present

## 2018-10-29 DIAGNOSIS — Z9181 History of falling: Secondary | ICD-10-CM | POA: Diagnosis not present

## 2018-10-29 DIAGNOSIS — Z96653 Presence of artificial knee joint, bilateral: Secondary | ICD-10-CM | POA: Diagnosis not present

## 2018-10-29 DIAGNOSIS — M109 Gout, unspecified: Secondary | ICD-10-CM | POA: Diagnosis not present

## 2018-10-29 DIAGNOSIS — G4700 Insomnia, unspecified: Secondary | ICD-10-CM | POA: Diagnosis not present

## 2018-10-29 DIAGNOSIS — K219 Gastro-esophageal reflux disease without esophagitis: Secondary | ICD-10-CM | POA: Diagnosis not present

## 2018-10-29 DIAGNOSIS — E1122 Type 2 diabetes mellitus with diabetic chronic kidney disease: Secondary | ICD-10-CM | POA: Diagnosis not present

## 2018-10-29 DIAGNOSIS — Z471 Aftercare following joint replacement surgery: Secondary | ICD-10-CM | POA: Diagnosis not present

## 2018-10-29 DIAGNOSIS — I129 Hypertensive chronic kidney disease with stage 1 through stage 4 chronic kidney disease, or unspecified chronic kidney disease: Secondary | ICD-10-CM | POA: Diagnosis not present

## 2018-10-31 DIAGNOSIS — Z7982 Long term (current) use of aspirin: Secondary | ICD-10-CM | POA: Diagnosis not present

## 2018-10-31 DIAGNOSIS — Z9181 History of falling: Secondary | ICD-10-CM | POA: Diagnosis not present

## 2018-10-31 DIAGNOSIS — G4700 Insomnia, unspecified: Secondary | ICD-10-CM | POA: Diagnosis not present

## 2018-10-31 DIAGNOSIS — Z96653 Presence of artificial knee joint, bilateral: Secondary | ICD-10-CM | POA: Diagnosis not present

## 2018-10-31 DIAGNOSIS — Z7984 Long term (current) use of oral hypoglycemic drugs: Secondary | ICD-10-CM | POA: Diagnosis not present

## 2018-10-31 DIAGNOSIS — E049 Nontoxic goiter, unspecified: Secondary | ICD-10-CM | POA: Diagnosis not present

## 2018-10-31 DIAGNOSIS — Z86010 Personal history of colonic polyps: Secondary | ICD-10-CM | POA: Diagnosis not present

## 2018-10-31 DIAGNOSIS — I129 Hypertensive chronic kidney disease with stage 1 through stage 4 chronic kidney disease, or unspecified chronic kidney disease: Secondary | ICD-10-CM | POA: Diagnosis not present

## 2018-10-31 DIAGNOSIS — K219 Gastro-esophageal reflux disease without esophagitis: Secondary | ICD-10-CM | POA: Diagnosis not present

## 2018-10-31 DIAGNOSIS — N183 Chronic kidney disease, stage 3 (moderate): Secondary | ICD-10-CM | POA: Diagnosis not present

## 2018-10-31 DIAGNOSIS — I472 Ventricular tachycardia: Secondary | ICD-10-CM | POA: Diagnosis not present

## 2018-10-31 DIAGNOSIS — Z471 Aftercare following joint replacement surgery: Secondary | ICD-10-CM | POA: Diagnosis not present

## 2018-10-31 DIAGNOSIS — M109 Gout, unspecified: Secondary | ICD-10-CM | POA: Diagnosis not present

## 2018-10-31 DIAGNOSIS — D631 Anemia in chronic kidney disease: Secondary | ICD-10-CM | POA: Diagnosis not present

## 2018-10-31 DIAGNOSIS — E1122 Type 2 diabetes mellitus with diabetic chronic kidney disease: Secondary | ICD-10-CM | POA: Diagnosis not present

## 2018-10-31 DIAGNOSIS — E785 Hyperlipidemia, unspecified: Secondary | ICD-10-CM | POA: Diagnosis not present

## 2018-10-31 DIAGNOSIS — J45909 Unspecified asthma, uncomplicated: Secondary | ICD-10-CM | POA: Diagnosis not present

## 2018-11-05 DIAGNOSIS — K219 Gastro-esophageal reflux disease without esophagitis: Secondary | ICD-10-CM | POA: Diagnosis not present

## 2018-11-05 DIAGNOSIS — Z7982 Long term (current) use of aspirin: Secondary | ICD-10-CM | POA: Diagnosis not present

## 2018-11-05 DIAGNOSIS — M109 Gout, unspecified: Secondary | ICD-10-CM | POA: Diagnosis not present

## 2018-11-05 DIAGNOSIS — G4700 Insomnia, unspecified: Secondary | ICD-10-CM | POA: Diagnosis not present

## 2018-11-05 DIAGNOSIS — Z471 Aftercare following joint replacement surgery: Secondary | ICD-10-CM | POA: Diagnosis not present

## 2018-11-05 DIAGNOSIS — D631 Anemia in chronic kidney disease: Secondary | ICD-10-CM | POA: Diagnosis not present

## 2018-11-05 DIAGNOSIS — J45909 Unspecified asthma, uncomplicated: Secondary | ICD-10-CM | POA: Diagnosis not present

## 2018-11-05 DIAGNOSIS — I472 Ventricular tachycardia: Secondary | ICD-10-CM | POA: Diagnosis not present

## 2018-11-05 DIAGNOSIS — E1122 Type 2 diabetes mellitus with diabetic chronic kidney disease: Secondary | ICD-10-CM | POA: Diagnosis not present

## 2018-11-05 DIAGNOSIS — Z9181 History of falling: Secondary | ICD-10-CM | POA: Diagnosis not present

## 2018-11-05 DIAGNOSIS — Z7984 Long term (current) use of oral hypoglycemic drugs: Secondary | ICD-10-CM | POA: Diagnosis not present

## 2018-11-05 DIAGNOSIS — Z96653 Presence of artificial knee joint, bilateral: Secondary | ICD-10-CM | POA: Diagnosis not present

## 2018-11-05 DIAGNOSIS — N183 Chronic kidney disease, stage 3 (moderate): Secondary | ICD-10-CM | POA: Diagnosis not present

## 2018-11-05 DIAGNOSIS — I129 Hypertensive chronic kidney disease with stage 1 through stage 4 chronic kidney disease, or unspecified chronic kidney disease: Secondary | ICD-10-CM | POA: Diagnosis not present

## 2018-11-05 DIAGNOSIS — E785 Hyperlipidemia, unspecified: Secondary | ICD-10-CM | POA: Diagnosis not present

## 2018-11-05 DIAGNOSIS — Z86010 Personal history of colonic polyps: Secondary | ICD-10-CM | POA: Diagnosis not present

## 2018-11-05 DIAGNOSIS — E049 Nontoxic goiter, unspecified: Secondary | ICD-10-CM | POA: Diagnosis not present

## 2018-11-07 DIAGNOSIS — N183 Chronic kidney disease, stage 3 (moderate): Secondary | ICD-10-CM | POA: Diagnosis not present

## 2018-11-07 DIAGNOSIS — G4700 Insomnia, unspecified: Secondary | ICD-10-CM | POA: Diagnosis not present

## 2018-11-07 DIAGNOSIS — I472 Ventricular tachycardia: Secondary | ICD-10-CM | POA: Diagnosis not present

## 2018-11-07 DIAGNOSIS — Z7984 Long term (current) use of oral hypoglycemic drugs: Secondary | ICD-10-CM | POA: Diagnosis not present

## 2018-11-07 DIAGNOSIS — M109 Gout, unspecified: Secondary | ICD-10-CM | POA: Diagnosis not present

## 2018-11-07 DIAGNOSIS — Z471 Aftercare following joint replacement surgery: Secondary | ICD-10-CM | POA: Diagnosis not present

## 2018-11-07 DIAGNOSIS — K219 Gastro-esophageal reflux disease without esophagitis: Secondary | ICD-10-CM | POA: Diagnosis not present

## 2018-11-07 DIAGNOSIS — I129 Hypertensive chronic kidney disease with stage 1 through stage 4 chronic kidney disease, or unspecified chronic kidney disease: Secondary | ICD-10-CM | POA: Diagnosis not present

## 2018-11-07 DIAGNOSIS — J45909 Unspecified asthma, uncomplicated: Secondary | ICD-10-CM | POA: Diagnosis not present

## 2018-11-07 DIAGNOSIS — E1122 Type 2 diabetes mellitus with diabetic chronic kidney disease: Secondary | ICD-10-CM | POA: Diagnosis not present

## 2018-11-07 DIAGNOSIS — E049 Nontoxic goiter, unspecified: Secondary | ICD-10-CM | POA: Diagnosis not present

## 2018-11-07 DIAGNOSIS — D631 Anemia in chronic kidney disease: Secondary | ICD-10-CM | POA: Diagnosis not present

## 2018-11-07 DIAGNOSIS — Z7982 Long term (current) use of aspirin: Secondary | ICD-10-CM | POA: Diagnosis not present

## 2018-11-07 DIAGNOSIS — E785 Hyperlipidemia, unspecified: Secondary | ICD-10-CM | POA: Diagnosis not present

## 2018-11-07 DIAGNOSIS — Z9181 History of falling: Secondary | ICD-10-CM | POA: Diagnosis not present

## 2018-11-07 DIAGNOSIS — Z96653 Presence of artificial knee joint, bilateral: Secondary | ICD-10-CM | POA: Diagnosis not present

## 2018-11-07 DIAGNOSIS — Z86010 Personal history of colonic polyps: Secondary | ICD-10-CM | POA: Diagnosis not present

## 2018-11-16 ENCOUNTER — Other Ambulatory Visit: Payer: Self-pay | Admitting: Internal Medicine

## 2018-11-17 DIAGNOSIS — Z96653 Presence of artificial knee joint, bilateral: Secondary | ICD-10-CM | POA: Diagnosis not present

## 2018-11-17 DIAGNOSIS — Z7984 Long term (current) use of oral hypoglycemic drugs: Secondary | ICD-10-CM | POA: Diagnosis not present

## 2018-11-17 DIAGNOSIS — Z9181 History of falling: Secondary | ICD-10-CM | POA: Diagnosis not present

## 2018-11-17 DIAGNOSIS — E049 Nontoxic goiter, unspecified: Secondary | ICD-10-CM | POA: Diagnosis not present

## 2018-11-17 DIAGNOSIS — Z7982 Long term (current) use of aspirin: Secondary | ICD-10-CM | POA: Diagnosis not present

## 2018-11-17 DIAGNOSIS — J45909 Unspecified asthma, uncomplicated: Secondary | ICD-10-CM | POA: Diagnosis not present

## 2018-11-17 DIAGNOSIS — E1122 Type 2 diabetes mellitus with diabetic chronic kidney disease: Secondary | ICD-10-CM | POA: Diagnosis not present

## 2018-11-17 DIAGNOSIS — K219 Gastro-esophageal reflux disease without esophagitis: Secondary | ICD-10-CM | POA: Diagnosis not present

## 2018-11-17 DIAGNOSIS — G4700 Insomnia, unspecified: Secondary | ICD-10-CM | POA: Diagnosis not present

## 2018-11-17 DIAGNOSIS — N183 Chronic kidney disease, stage 3 (moderate): Secondary | ICD-10-CM | POA: Diagnosis not present

## 2018-11-17 DIAGNOSIS — E785 Hyperlipidemia, unspecified: Secondary | ICD-10-CM | POA: Diagnosis not present

## 2018-11-17 DIAGNOSIS — Z471 Aftercare following joint replacement surgery: Secondary | ICD-10-CM | POA: Diagnosis not present

## 2018-11-17 DIAGNOSIS — D631 Anemia in chronic kidney disease: Secondary | ICD-10-CM | POA: Diagnosis not present

## 2018-11-17 DIAGNOSIS — I472 Ventricular tachycardia: Secondary | ICD-10-CM | POA: Diagnosis not present

## 2018-11-17 DIAGNOSIS — M109 Gout, unspecified: Secondary | ICD-10-CM | POA: Diagnosis not present

## 2018-11-17 DIAGNOSIS — I129 Hypertensive chronic kidney disease with stage 1 through stage 4 chronic kidney disease, or unspecified chronic kidney disease: Secondary | ICD-10-CM | POA: Diagnosis not present

## 2018-11-17 DIAGNOSIS — Z86010 Personal history of colonic polyps: Secondary | ICD-10-CM | POA: Diagnosis not present

## 2018-11-20 DIAGNOSIS — Z7982 Long term (current) use of aspirin: Secondary | ICD-10-CM | POA: Diagnosis not present

## 2018-11-20 DIAGNOSIS — D631 Anemia in chronic kidney disease: Secondary | ICD-10-CM | POA: Diagnosis not present

## 2018-11-20 DIAGNOSIS — K219 Gastro-esophageal reflux disease without esophagitis: Secondary | ICD-10-CM | POA: Diagnosis not present

## 2018-11-20 DIAGNOSIS — G4700 Insomnia, unspecified: Secondary | ICD-10-CM | POA: Diagnosis not present

## 2018-11-20 DIAGNOSIS — E1122 Type 2 diabetes mellitus with diabetic chronic kidney disease: Secondary | ICD-10-CM | POA: Diagnosis not present

## 2018-11-20 DIAGNOSIS — Z96653 Presence of artificial knee joint, bilateral: Secondary | ICD-10-CM | POA: Diagnosis not present

## 2018-11-20 DIAGNOSIS — Z86010 Personal history of colonic polyps: Secondary | ICD-10-CM | POA: Diagnosis not present

## 2018-11-20 DIAGNOSIS — M109 Gout, unspecified: Secondary | ICD-10-CM | POA: Diagnosis not present

## 2018-11-20 DIAGNOSIS — E049 Nontoxic goiter, unspecified: Secondary | ICD-10-CM | POA: Diagnosis not present

## 2018-11-20 DIAGNOSIS — N183 Chronic kidney disease, stage 3 (moderate): Secondary | ICD-10-CM | POA: Diagnosis not present

## 2018-11-20 DIAGNOSIS — Z471 Aftercare following joint replacement surgery: Secondary | ICD-10-CM | POA: Diagnosis not present

## 2018-11-20 DIAGNOSIS — Z7984 Long term (current) use of oral hypoglycemic drugs: Secondary | ICD-10-CM | POA: Diagnosis not present

## 2018-11-20 DIAGNOSIS — J45909 Unspecified asthma, uncomplicated: Secondary | ICD-10-CM | POA: Diagnosis not present

## 2018-11-20 DIAGNOSIS — E785 Hyperlipidemia, unspecified: Secondary | ICD-10-CM | POA: Diagnosis not present

## 2018-11-20 DIAGNOSIS — Z9181 History of falling: Secondary | ICD-10-CM | POA: Diagnosis not present

## 2018-11-20 DIAGNOSIS — I129 Hypertensive chronic kidney disease with stage 1 through stage 4 chronic kidney disease, or unspecified chronic kidney disease: Secondary | ICD-10-CM | POA: Diagnosis not present

## 2018-11-20 DIAGNOSIS — I472 Ventricular tachycardia: Secondary | ICD-10-CM | POA: Diagnosis not present

## 2018-12-03 ENCOUNTER — Telehealth: Payer: Self-pay | Admitting: Internal Medicine

## 2018-12-03 NOTE — Telephone Encounter (Signed)
[employer] Forms have been received via mail.  Forms have been completed & placed in providers box to review and sign.

## 2018-12-03 NOTE — Telephone Encounter (Signed)
Forms have been signed, Faxed to [facility] [phone], Copy sent to scan.   Original mailed to patient.

## 2018-12-04 DIAGNOSIS — M25662 Stiffness of left knee, not elsewhere classified: Secondary | ICD-10-CM | POA: Diagnosis not present

## 2018-12-04 DIAGNOSIS — Z96652 Presence of left artificial knee joint: Secondary | ICD-10-CM | POA: Diagnosis not present

## 2018-12-09 DIAGNOSIS — M25662 Stiffness of left knee, not elsewhere classified: Secondary | ICD-10-CM | POA: Diagnosis not present

## 2018-12-09 DIAGNOSIS — Z96652 Presence of left artificial knee joint: Secondary | ICD-10-CM | POA: Diagnosis not present

## 2018-12-11 DIAGNOSIS — Z96652 Presence of left artificial knee joint: Secondary | ICD-10-CM | POA: Diagnosis not present

## 2018-12-11 DIAGNOSIS — M25662 Stiffness of left knee, not elsewhere classified: Secondary | ICD-10-CM | POA: Diagnosis not present

## 2018-12-16 ENCOUNTER — Telehealth: Payer: Self-pay | Admitting: Internal Medicine

## 2018-12-16 MED ORDER — ALBUTEROL SULFATE HFA 108 (90 BASE) MCG/ACT IN AERS: 2.0000 | INHALATION_SPRAY | Freq: Four times a day (QID) | RESPIRATORY_TRACT | 2 refills | Status: DC | PRN

## 2018-12-16 NOTE — Telephone Encounter (Signed)
Copied from [place] [id number]. Topic: Quick Communication - Rx Refill/Question >> Dec 16, 2018 11:53 AM Reyne Dumas L wrote: Medication: albuterol (PROVENTIL HFA;VENTOLIN HFA) 108 (90 Base) MCG/ACT inhaler  Has the patient contacted their pharmacy? Yes- states they haven't heard from [place] (Agent: If no, request that the patient contact the pharmacy for the refill.) (Agent: If yes, when and what did the pharmacy advise?)  Preferred Pharmacy (with phone number or street name): [id number] - [place], [place] [phone] (Phone) [phone] (Fax)  Agent: Please be advised that RX refills may take up to 3 business days. We ask that you follow-up with your pharmacy.

## 2018-12-17 DIAGNOSIS — Z96652 Presence of left artificial knee joint: Secondary | ICD-10-CM | POA: Diagnosis not present

## 2018-12-17 DIAGNOSIS — M25662 Stiffness of left knee, not elsewhere classified: Secondary | ICD-10-CM | POA: Diagnosis not present

## 2018-12-18 DIAGNOSIS — M25662 Stiffness of left knee, not elsewhere classified: Secondary | ICD-10-CM | POA: Diagnosis not present

## 2018-12-18 DIAGNOSIS — Z96652 Presence of left artificial knee joint: Secondary | ICD-10-CM | POA: Diagnosis not present

## 2019-01-01 ENCOUNTER — Other Ambulatory Visit: Payer: Self-pay | Admitting: Internal Medicine

## 2019-01-01 NOTE — Telephone Encounter (Signed)
Done erx 

## 2019-01-18 ENCOUNTER — Other Ambulatory Visit: Payer: Self-pay | Admitting: Internal Medicine

## 2019-01-20 ENCOUNTER — Telehealth: Payer: Self-pay

## 2019-01-21 ENCOUNTER — Other Ambulatory Visit: Payer: Self-pay

## 2019-01-21 DIAGNOSIS — M25562 Pain in left knee: Secondary | ICD-10-CM | POA: Diagnosis not present

## 2019-01-21 DIAGNOSIS — Z96653 Presence of artificial knee joint, bilateral: Secondary | ICD-10-CM | POA: Diagnosis not present

## 2019-01-21 DIAGNOSIS — M25561 Pain in right knee: Secondary | ICD-10-CM | POA: Diagnosis not present

## 2019-01-21 NOTE — Patient Outreach (Signed)
[facility]) Care Management  01/21/2019  SUMEYA YONTZ 05-27-1951 [id number]   Telephone Screen  Referral Date:01/20/2019 Referral Source: EMMI-Prevent Call Referral Reason: " patient engagement score 8, DM, HTN" Insurance:[employer] Medicare  Outreach attempt # 1 to patient. No answer. RN CM left HIPAA compliant voicemail message along with contact info.      Plan: RN CM will make outreach attempt to patient within 3-4 business days. RN CM will send unsuccessful outreach letter to patient.   Enzo Montgomery, RN,BSN,CCM [place] Management Telephonic Care Management Coordinator Direct Phone: [phone] Toll Free: [phone] Fax: [phone]

## 2019-01-26 ENCOUNTER — Other Ambulatory Visit: Payer: Self-pay

## 2019-01-26 NOTE — Patient Outreach (Signed)
[facility]) Care Management  01/26/2019  Brooke [name] 03/04/1951 [id number]   Telephone Screen  Referral Date:01/20/2019 Referral Source: EMMI-Prevent Call Referral Reason: " patient engagement score 8, DM, HTN" Insurance:[employer] Medicare   Outreach attempt #2 to patient. Spoke with patient and screening completed. Patient resides in her home with her spouse. She voices that she is the primary caregiver for spouse and assists him. She is independent with ADLs and most IADLs except she no longer drives. She relies on [employer] for transportation She denies any recent falls. Per chart review, patient has PMH of DM,HTN, alopecia, aortic atherosclerosis, GERD, eczema, HLD and hypothyroidism, OA. She states that she checks her blood sugars every other day. She reports that cbgs range in the 90s. Per records, last A!C was 5.8(March 2020). Patient is adhering to diabetic diet. She is agreeable to [facility] referral for continued disease mgmt education and support. Patient reports that she takes about 12 meds. She denies any issues affording and/or managing meds at this time. [facility] services reviewed and discussed with patient. Verbal consent for services given.     Plan: RN CM will send [facility] referral for further disease mgmt/education and support.    Enzo Montgomery, RN,BSN,CCM [place] Management Telephonic Care Management Coordinator Direct Phone: [phone] Toll Free: [phone] Fax: [phone]

## 2019-01-27 ENCOUNTER — Ambulatory Visit (INDEPENDENT_AMBULATORY_CARE_PROVIDER_SITE_OTHER)
Admission: RE | Admit: 2019-01-27 | Discharge: 2019-01-27 | Disposition: A | Discharge status: Home / Self Care | Payer: Medicare Other | Source: Ambulatory Visit | Attending: Internal Medicine | Admitting: Internal Medicine

## 2019-01-27 ENCOUNTER — Ambulatory Visit (INDEPENDENT_AMBULATORY_CARE_PROVIDER_SITE_OTHER): Payer: Medicare Other | Admitting: Internal Medicine

## 2019-01-27 ENCOUNTER — Other Ambulatory Visit: Payer: Self-pay

## 2019-01-27 ENCOUNTER — Encounter: Payer: Self-pay | Admitting: Internal Medicine

## 2019-01-27 ENCOUNTER — Other Ambulatory Visit (INDEPENDENT_AMBULATORY_CARE_PROVIDER_SITE_OTHER): Payer: Medicare Other

## 2019-01-27 ENCOUNTER — Other Ambulatory Visit: Payer: Self-pay | Admitting: Internal Medicine

## 2019-01-27 VITALS — BP 132/84 | HR 69 | Temp 98.3°F | Ht 63.0 in | Wt 169.0 lb

## 2019-01-27 DIAGNOSIS — E119 Type 2 diabetes mellitus without complications: Secondary | ICD-10-CM

## 2019-01-27 DIAGNOSIS — E049 Nontoxic goiter, unspecified: Secondary | ICD-10-CM | POA: Diagnosis not present

## 2019-01-27 DIAGNOSIS — Z0001 Encounter for general adult medical examination with abnormal findings: Secondary | ICD-10-CM | POA: Insufficient documentation

## 2019-01-27 DIAGNOSIS — E559 Vitamin D deficiency, unspecified: Secondary | ICD-10-CM

## 2019-01-27 DIAGNOSIS — Z0000 Encounter for general adult medical examination without abnormal findings: Secondary | ICD-10-CM

## 2019-01-27 DIAGNOSIS — R0600 Dyspnea, unspecified: Secondary | ICD-10-CM | POA: Diagnosis not present

## 2019-01-27 DIAGNOSIS — E785 Hyperlipidemia, unspecified: Secondary | ICD-10-CM

## 2019-01-27 DIAGNOSIS — E611 Iron deficiency: Secondary | ICD-10-CM | POA: Diagnosis not present

## 2019-01-27 DIAGNOSIS — E538 Deficiency of other specified B group vitamins: Secondary | ICD-10-CM

## 2019-01-27 DIAGNOSIS — R51 Headache: Secondary | ICD-10-CM | POA: Diagnosis not present

## 2019-01-27 DIAGNOSIS — R519 Headache, unspecified: Secondary | ICD-10-CM

## 2019-01-27 LAB — URINALYSIS, ROUTINE W REFLEX MICROSCOPIC
Bilirubin Urine: NEGATIVE
Ketones, ur: NEGATIVE
Leukocytes,Ua: NEGATIVE
Nitrite: POSITIVE — AB
RBC / HPF: NONE SEEN (ref 0–?)
Specific Gravity, Urine: 1.02 (ref 1.000–1.030)
Total Protein, Urine: 30 — AB
Urine Glucose: NEGATIVE
Urobilinogen, UA: 0.2 (ref 0.0–1.0)
pH: 7 (ref 5.0–8.0)

## 2019-01-27 LAB — HEMOGLOBIN A1C: Hgb A1c MFr Bld: 5.6 % (ref 4.6–6.5)

## 2019-01-27 LAB — BASIC METABOLIC PANEL
BUN: 24 mg/dL — ABNORMAL HIGH (ref 6–23)
CO2: 25 mEq/L (ref 19–32)
Calcium: 9.2 mg/dL (ref 8.4–10.5)
Chloride: 106 mEq/L (ref 96–112)
Creatinine, Ser: 1.27 mg/dL — ABNORMAL HIGH (ref 0.40–1.20)
GFR: 50.78 mL/min — ABNORMAL LOW (ref >60.00)
Glucose, Bld: 104 mg/dL — ABNORMAL HIGH (ref 70–99)
Potassium: 4.1 mEq/L (ref 3.5–5.1)
Sodium: 140 mEq/L (ref 135–145)

## 2019-01-27 LAB — LIPID PANEL
Cholesterol: 138 mg/dL (ref 0–200)
HDL: 65.7 mg/dL (ref >39.00)
LDL Cholesterol: 55 mg/dL (ref 0–99)
NonHDL: 71.86
Total CHOL/HDL Ratio: 2
Triglycerides: 85 mg/dL (ref 0.0–149.0)
VLDL: 17 mg/dL (ref 0.0–40.0)

## 2019-01-27 LAB — HEPATIC FUNCTION PANEL
ALT: 9 U/L (ref 0–35)
AST: 13 U/L (ref 0–37)
Albumin: 4.1 g/dL (ref 3.5–5.2)
Alkaline Phosphatase: 71 U/L (ref 39–117)
Bilirubin, Direct: 0 mg/dL (ref 0.0–0.3)
Total Bilirubin: 0.3 mg/dL (ref 0.2–1.2)
Total Protein: 7.3 g/dL (ref 6.0–8.3)

## 2019-01-27 LAB — CBC WITH DIFFERENTIAL/PLATELET
Basophils Absolute: 0.1 10*3/uL (ref 0.0–0.1)
Basophils Relative: 0.9 % (ref 0.0–3.0)
Eosinophils Absolute: 0.1 10*3/uL (ref 0.0–0.7)
Eosinophils Relative: 1.1 % (ref 0.0–5.0)
HCT: 34.2 % — ABNORMAL LOW (ref 36.0–46.0)
Hemoglobin: 11 g/dL — ABNORMAL LOW (ref 12.0–15.0)
Lymphocytes Relative: 13.3 % (ref 12.0–46.0)
Lymphs Abs: 0.9 10*3/uL (ref 0.7–4.0)
MCHC: 32.2 g/dL (ref 30.0–36.0)
MCV: 85.5 fl (ref 78.0–100.0)
Monocytes Absolute: 0.9 10*3/uL (ref 0.1–1.0)
Monocytes Relative: 13.6 % — ABNORMAL HIGH (ref 3.0–12.0)
Neutro Abs: 5 10*3/uL (ref 1.4–7.7)
Neutrophils Relative %: 71.1 % (ref 43.0–77.0)
Platelets: 268 10*3/uL (ref 150.0–400.0)
RBC: 4 Mil/uL (ref 3.87–5.11)
RDW: 14.9 % (ref 11.5–15.5)
WBC: 7 10*3/uL (ref 4.0–10.5)

## 2019-01-27 LAB — VITAMIN B12: Vitamin B-12: 323 pg/mL (ref 211–911)

## 2019-01-27 LAB — IBC PANEL
Iron: 54 ug/dL (ref 42–145)
Saturation Ratios: 12.4 % — ABNORMAL LOW (ref 20.0–50.0)
Transferrin: 311 mg/dL (ref 212.0–360.0)

## 2019-01-27 LAB — MICROALBUMIN / CREATININE URINE RATIO
Creatinine,U: 87.3 mg/dL
Microalb Creat Ratio: 4.5 mg/g (ref 0.0–30.0)
Microalb, Ur: 4 mg/dL — ABNORMAL HIGH (ref 0.0–1.9)

## 2019-01-27 LAB — BRAIN NATRIURETIC PEPTIDE: Pro B Natriuretic peptide (BNP): 152 pg/mL — ABNORMAL HIGH (ref 0.0–100.0)

## 2019-01-27 LAB — VITAMIN D 25 HYDROXY (VIT D DEFICIENCY, FRACTURES): VITD: 15.43 ng/mL — ABNORMAL LOW (ref 30.00–100.00)

## 2019-01-27 LAB — TSH: TSH: 2.35 u[IU]/mL (ref 0.35–4.50)

## 2019-01-27 MED ORDER — VITAMIN D (ERGOCALCIFEROL) 1.25 MG (50000 UNIT) PO CAPS: 50000.0000 [IU] | ORAL_CAPSULE | ORAL | 0 refills | Status: DC

## 2019-01-27 NOTE — Assessment & Plan Note (Signed)

## 2019-01-27 NOTE — Assessment & Plan Note (Signed)
With ? Enlarging left thyroid, - for thyroid u/s r/o enlarging nodule

## 2019-01-27 NOTE — Patient Instructions (Signed)
Please continue all other medications as before, and refills have been done if requested.  Please have the pharmacy call with any other refills you may need.  Please continue your efforts at being more active, low cholesterol diet, and weight control.  You are otherwise up to date with prevention measures today.  Please keep your appointments with your specialists as you may have planned  You will be contacted regarding the referral for: Head MRI, and thyroid ultrasound  Please go to the XRAY Department in the Basement (go straight as you get off the elevator) for the x-ray testing  Please go to the LAB in the Basement (turn left off the elevator) for the tests to be done today  You will be contacted by phone if any changes need to be made immediately.  Otherwise, you will receive a letter about your results with an explanation, but please check with MyChart first.  Please remember to sign up for MyChart if you have not done so, as this will be important to you in the future with finding out test results, communicating by private email, and scheduling acute appointments online when needed.  Please return in 6 months, or sooner if needed, with Lab testing done 3-5 days before

## 2019-01-27 NOTE — Assessment & Plan Note (Signed)
Etiology unclear, unusual for her, neuro exam no change, but will need Head MRI

## 2019-01-27 NOTE — Assessment & Plan Note (Signed)
stable overall by history and exam, recent data reviewed with pt, and pt to continue medical treatment as before,  to f/u any worsening symptoms or concerns, for a1c with labs 

## 2019-01-27 NOTE — Assessment & Plan Note (Signed)
etilogy unclear, exam benign, declines ecg, for cxr and BNP with labs  In addition to the time spent performing CPE, I spent an additional 25 minutes face to face,in which greater than 50% of this time was spent in counseling and coordination of care for patient's acute illness as documented, including the differential dx, treatment, further evaluation and other management of dyspnea, HA, goiter enlarging, DM, HTN, HLD

## 2019-01-27 NOTE — Assessment & Plan Note (Addendum)
Goal ldl < 70, stable overall by history and exam, recent data reviewed with pt, and pt to continue medical treatment as before,  to f/u any worsening symptoms or concerns 

## 2019-01-27 NOTE — Progress Notes (Signed)
Subjective:    Patient ID: Brooke [name], female    DOB: 03-May-1951, 68 y.o.   MRN: [id number]  HPI  Here for wellness and f/u;  Overall doing ok;  .Marland Kitchen  Pt denies polydipsia, polyuria, or low sugar symptoms. Pt states overall good compliance with treatment and medications, good tolerability, and has been trying to follow appropriate diet.  No fever, night sweats, wt loss, loss of appetite, or other constitutional symptoms.  Pt states good ability with ADL's, has low fall risk, home safety reviewed and adequate, no other significant changes in hearing or vision, and only occasionally active with exercise.  Due for eye exam about July 27.   CBG 98 yesterday am, check several times per wk.    Also pt c/o right sided HA , dull constant gradually worsening over 3 mo now moderate overall, nothing seems to make better or worse such as exercise, bending , and non tender to touch without scalp redness or swelling or trauma.  Nothing else makes better or worse. Pt denies new neurological symptoms such as new facial or extremity weakness or numbness.   Also c/o unusual for her dyspnea at rest and not really worse with exertion 3 months, intermittent, mild, , and Pt denies chest pain, wheezing, orthopnea, PND, increased LE swelling, palpitations, dizziness or syncope.   Pt denies fever, wt loss, night sweats, loss of appetite, or other constitutional symptoms Also pt daughter asks her to mention her left thyroid seems to be enlarging, Denies hyper or hypo thyroid symptoms such as voice, skin or hair change. Last u/s was > 1 yr.   Past Medical History:  Diagnosis Date  . Alopecia   . Aortic atherosclerosis ([place])   . Asthma    FOLLOWED BY PCP  . Eczema   . GERD (gastroesophageal reflux disease)   . Gout    04-28-2018--- per pt stable , as been a while since last episode  . History of colon polyps   . History of syncope 2015  . Hyperlipidemia   . Hypertension   . Hypothyroidism   . OA (osteoarthritis) of  knee    bilateral  . PVC's (premature ventricular contractions)   . Toxic multinodular goiter    03/ 2003  s/p RAI  . Type 2 diabetes mellitus ([place])    followed by pcp  . Ventricular tachycardia, polymorphic ([place]) 01/04/2014   primary cardiologist-- dr Tressia Miners turner (hx monitor 2015 showed couplet PVCs, as trigger)  . Wears glasses   . Wears partial dentures    upper   Past Surgical History:  Procedure Laterality Date  . ABDOMINAL HYSTERECTOMY  10/22/1999   WITH BSO  . CATARACT EXTRACTION W/ INTRAOCULAR LENS IMPLANT Left YRS AGO  . EXCISION ABDOMINAL WALL MASS  12-20-2005   dr Margot Chimes @[facility]    neruofibroma  . LAPAROSCOPIC CHOLECYSTECTOMY  10/01/2002   dr Margot Chimes @[facility]   . LEFT HEART CATHETERIZATION WITH CORONARY ANGIOGRAM N/A 01/07/2014   Procedure: LEFT HEART CATHETERIZATION WITH CORONARY ANGIOGRAM;  Surgeon: Peter M Martinique, MD;  Location: [facility] CATH LAB;  Service: Cardiovascular;  Laterality: N/A;  . RASTELLI PROCEDURE  6/98 neg  . RENAL ARTERY STENT Left 11/2003   angioplasty and stenting  . RENAL ARTERY STENT Left 02/2005   re-stenting  . TOTAL KNEE ARTHROPLASTY Right 05/05/2018   Procedure: RIGHT TOTAL KNEE ARTHROPLASTY;  Surgeon: Frederik Pear, MD;  Location: [facility];  Service: Orthopedics;  Laterality: Right;  . TOTAL KNEE ARTHROPLASTY Left 09/30/2018   Procedure: TOTAL  KNEE ARTHROPLASTY;  Surgeon: Melrose Nakayama, MD;  Location: [facility];  Service: Orthopedics;  Laterality: Left;    reports that she has never smoked. She has never used smokeless tobacco. She reports that she does not drink alcohol or use drugs. family history includes Diabetes in her mother; Heart disease in her father. Allergies  Allergen Reactions  . Ace Inhibitors Swelling       . Codeine Nausea And Vomiting and Rash  . Hydrocodone Nausea And Vomiting  . Tramadol Nausea And Vomiting  . Tylenol [Acetaminophen] Itching and Rash   Current Outpatient Medications on File Prior to Visit  Medication Sig Dispense  Refill  . albuterol (VENTOLIN HFA) 108 (90 Base) MCG/ACT inhaler Inhale 2 puffs into the lungs every 6 (six) hours as needed for wheezing. 1 Inhaler 2  . allopurinol (ZYLOPRIM) 100 MG tablet TAKE 1 TABLET BY MOUTH EVERY DAY (Patient taking differently: Take 100 mg by mouth daily. ) 90 tablet 3  . AMBULATORY NON FORMULARY MEDICATION Medication Name: Rolling walker 1 Device 0  . amLODipine (NORVASC) 10 MG tablet TAKE 1 TABLET BY MOUTH EVERY DAY 90 tablet 3  . aspirin EC 325 MG EC tablet Take 1 tablet (325 mg total) by mouth 2 (two) times daily after a meal. 30 tablet 0  . citalopram (CELEXA) 20 MG tablet Take 1 tablet (20 mg total) by mouth daily. 90 tablet 3  . clonazePAM (KLONOPIN) 0.5 MG tablet Take 1 tablet (0.5 mg total) by mouth 2 (two) times daily as needed (vertigo). 30 tablet 2  . fluticasone (FLOVENT HFA) 110 MCG/ACT inhaler Inhale 2 puffs into the lungs 2 (two) times daily. 12 g 12  . gabapentin (NEURONTIN) 100 MG capsule TAKE 1 CAPSULE (100 MG TOTAL) BY MOUTH AT BEDTIME. 90 capsule 1  . hydrochlorothiazide (HYDRODIURIL) 25 MG tablet TAKE 1 TABLET BY MOUTH EVERY DAY (Patient taking differently: Take 25 mg by mouth daily. ) 90 tablet 3  . ibuprofen (ADVIL) 800 MG tablet TAKE 1 TABLET BY MOUTH TWICE A DAY AS NEEDED FOR PAIN 60 tablet 2  . meclizine (ANTIVERT) 25 MG tablet Take 1 tablet (25 mg total) by mouth 3 (three) times daily as needed for dizziness. 30 tablet 5  . metFORMIN (GLUCOPHAGE-XR) 500 MG 24 hr tablet TAKE ONE TABLET BY MOUTH ONCE DAILY WITH  BREAKFAST (Patient taking differently: Take 500 mg by mouth daily with breakfast. TAKE ONE TABLET BY MOUTH ONCE DAILY WITH  BREAKFAST) 90 tablet 3  . metoprolol succinate (TOPROL-XL) 100 MG 24 hr tablet Take 100 mg by mouth 2 (two) times daily.    Marland Kitchen omeprazole (PRILOSEC) 20 MG capsule Take 1 capsule (20 mg total) by mouth 2 (two) times daily. 60 capsule 11  . oxyCODONE (OXY IR/ROXICODONE) 5 MG immediate release tablet Take 1-2 tablets (5-10  mg total) by mouth every 6 (six) hours as needed for moderate pain or severe pain. 40 tablet 0  . potassium chloride (KLOR-CON 10) 10 MEQ tablet Take 1 tablet (10 mEq total) by mouth 2 (two) times daily. 60 tablet 11  . rosuvastatin (CRESTOR) 20 MG tablet TAKE 1 TABLET BY MOUTH EVERY DAY 90 tablet 1  . tiZANidine (ZANAFLEX) 2 MG tablet Take 1-2 tablets (2-4 mg total) by mouth every 6 (six) hours as needed for muscle spasms. 40 tablet 1  . zolpidem (AMBIEN) 10 MG tablet TAKE 1 TABLET (10 MG TOTAL) BY MOUTH AT BEDTIME AS NEEDED FOR SLEEP. 90 tablet 1   No current facility-administered medications  on file prior to visit.    ROS: Constitutional: Negative for other unusual diaphoresis, sweats, appetite or weight changes HENT: Negative for other worsening hearing loss, ear pain, facial swelling, mouth sores or neck stiffness.   Eyes: Negative for other worsening pain, redness or other visual disturbance.  Respiratory: Negative for other stridor or swelling Cardiovascular: Negative for other palpitations or other chest pain  Gastrointestinal: Negative for worsening diarrhea or loose stools, blood in stool, distention or other pain Genitourinary: Negative for hematuria, flank pain or other change in urine volume.  Musculoskeletal: Negative for myalgias or other joint swelling.  Skin: Negative for other color change, or other wound or worsening drainage.  Neurological: Negative for other syncope or numbness. Hematological: Negative for other adenopathy or swelling Psychiatric/Behavioral: Negative for hallucinations, other worsening agitation, SI, self-injury, or new decreased concentration All other system neg per pt    Objective:   Physical Exam BP 132/84   Pulse 69   Temp 98.3 F (36.8 C) (Oral)   Ht 5\' 3"  (1.6 m)   Wt 169 lb (76.7 kg)   SpO2 98%   BMI 29.94 kg/m  VS noted,  Constitutional: Pt appears in NAD HENT: Head: NCAT.  Right Ear: External ear normal.  Left Ear: External ear  normal.  Eyes: . Pupils are equal, round, and reactive to light. Conjunctivae and EOM are normal Nose: without d/c or deformity Neck: Neck supple. Gross normal ROM, thyroid with goiter and left lobe enlargement, firm nontender Cardiovascular: Normal rate and regular rhythm.   Pulmonary/Chest: Effort normal and breath sounds without rales or wheezing.  Abd:  Soft, NT, ND, + BS, no organomegaly Neurological: Pt is alert. At baseline orientation, motor grossly intact Skin: Skin is warm. No rashes, other new lesions, no LE edema Psychiatric: Pt behavior is normal without agitation  No other exam findings Lab Results  Component Value Date   WBC 7.0 09/24/2018   HGB 11.8 (L) 09/24/2018   HCT 38.0 09/24/2018   PLT 255 09/24/2018   GLUCOSE 94 09/24/2018   CHOL 128 03/26/2018   TRIG 82 03/26/2018   HDL 52 03/26/2018   LDLDIRECT 139.1 09/05/2009   LDLCALC 60 03/26/2018   ALT 6 03/26/2018   AST 10 03/26/2018   NA 138 09/24/2018   K 3.4 (L) 09/24/2018   CL 109 09/24/2018   CREATININE 1.57 (H) 09/24/2018   BUN 32 (H) 09/24/2018   CO2 21 (L) 09/24/2018   TSH 1.85 01/10/2017   INR 1.0 09/24/2018   HGBA1C 5.8 (H) 09/24/2018   MICROALBUR 1.0 01/10/2017        Assessment & Plan:

## 2019-01-28 ENCOUNTER — Ambulatory Visit: Payer: Self-pay | Admitting: *Deleted

## 2019-01-28 NOTE — Telephone Encounter (Signed)
Pt returned call and lab message from Dr. Jenny Reichmann given to her with verbal understanding, regarding her vitamin D level and medications.

## 2019-01-28 NOTE — Telephone Encounter (Signed)
Pt called to get lab results. Please advise.   Attempted to call patient with lab results- in lab results: Notes recorded by Biagio Borg, MD on 01/27/2019 at 4:23 PM EDT  Letter sent, cont same tx except   The test results show that your current treatment is OK, except the Vitamin D level is low. Please take Vitamin D 50000 units weekly for 12 weeks, then plan to change to OTC Vitamin D3 at 2000 units per day, indefinitely..    Left message for patient to call back

## 2019-01-28 NOTE — Telephone Encounter (Signed)
Noted  

## 2019-02-03 ENCOUNTER — Other Ambulatory Visit: Payer: Self-pay | Admitting: *Deleted

## 2019-02-09 ENCOUNTER — Other Ambulatory Visit: Payer: Self-pay | Admitting: Internal Medicine

## 2019-02-09 ENCOUNTER — Other Ambulatory Visit: Payer: Self-pay

## 2019-02-09 ENCOUNTER — Ambulatory Visit
Admission: RE | Admit: 2019-02-09 | Discharge: 2019-02-09 | Disposition: A | Discharge status: Home / Self Care | Payer: Medicare Other | Source: Ambulatory Visit | Attending: Internal Medicine | Admitting: Internal Medicine

## 2019-02-09 ENCOUNTER — Encounter: Payer: Self-pay | Admitting: Internal Medicine

## 2019-02-09 DIAGNOSIS — E041 Nontoxic single thyroid nodule: Secondary | ICD-10-CM

## 2019-02-09 DIAGNOSIS — E049 Nontoxic goiter, unspecified: Secondary | ICD-10-CM

## 2019-02-09 DIAGNOSIS — E042 Nontoxic multinodular goiter: Secondary | ICD-10-CM | POA: Diagnosis not present

## 2019-02-10 ENCOUNTER — Telehealth: Payer: Self-pay

## 2019-02-10 ENCOUNTER — Other Ambulatory Visit: Payer: Self-pay | Admitting: Internal Medicine

## 2019-02-10 DIAGNOSIS — E041 Nontoxic single thyroid nodule: Secondary | ICD-10-CM

## 2019-02-10 NOTE — Telephone Encounter (Signed)
Called pt, LVM.   CRM created.  

## 2019-02-10 NOTE — Telephone Encounter (Signed)
-----   Message from Biagio Borg, MD sent at 02/09/2019  9:02 PM EDT ----- Letter sent, cont same tx except  The test results show that your current treatment is OK, except there are 2 thyroid nodules that are just large enough that we need to go a step further with a biopsy.  I would like to order this testing, and you should hear from the office, to make sure that there is no cancer present.Brooke [name] to please inform pt, I will do order the thyroid biopsy (which is a small procedure with numbing the neck, very quick test per interventional radiology)

## 2019-02-13 ENCOUNTER — Other Ambulatory Visit: Payer: Self-pay | Admitting: Internal Medicine

## 2019-02-13 DIAGNOSIS — Z1231 Encounter for screening mammogram for malignant neoplasm of breast: Secondary | ICD-10-CM

## 2019-02-17 ENCOUNTER — Telehealth: Payer: Self-pay

## 2019-02-17 NOTE — Telephone Encounter (Signed)
YOUR CARDIOLOGY TEAM HAS ARRANGED FOR AN E-VISIT FOR YOUR APPOINTMENT - PLEASE REVIEW IMPORTANT INFORMATION BELOW SEVERAL DAYS PRIOR TO YOUR APPOINTMENT ° °Due to the recent COVID-19 pandemic, we are transitioning in-person office visits to tele-medicine visits in an effort to decrease unnecessary exposure to our patients, their families, and staff. These visits are billed to your insurance just like a normal visit is. We also encourage you to sign up for MyChart if you have not already done so. You will need a smartphone if possible. For patients that do not have this, we can still complete the visit using a regular telephone but do prefer a smartphone to enable video when possible. You may have a family member that lives with you that can help. If possible, we also ask that you have a blood pressure cuff and scale at home to measure your blood pressure, heart rate and weight prior to your scheduled appointment. Patients with clinical needs that need an in-person evaluation and testing will still be able to come to the office if absolutely necessary. If you have any questions, feel free to call our office. ° ° ° ° °YOUR PROVIDER WILL BE USING THE FOLLOWING PLATFORM TO COMPLETE YOUR VISIT: Phone Call ° °• IF USING MYCHART - How to Download the MyChart App to Your SmartPhone  ° °- If Apple, go to App Store and type in MyChart in the search bar and download the app. If Android, ask patient to go to Google Play Store and type in MyChart in the search bar and download the app. The app is free but as with any other app downloads, your phone may require you to verify saved payment information or Apple/Android password.  °- You will need to then log into the app with your MyChart username and password, and select [facility] as your healthcare provider to link the account.  °- When it is time for your visit, go to the MyChart app, find appointments, and click Begin Video Visit. Be sure to Select Allow for your device to  access the Microphone and Camera for your visit. You will then be connected, and your provider will be with you shortly. ° **If you have any issues connecting or need assistance, please contact MyChart service desk [phone] ([phone])** ° **If using a computer, in order to ensure the best quality for your visit, you will need to use either of the following Internet Browsers: Google Chrome or Microsoft Edge** ° °• IF USING DOXIMITY or [email or web address] - The staff will give you instructions on receiving your link to join the meeting the day of your visit.  ° ° ° ° °2-3 DAYS BEFORE YOUR APPOINTMENT ° °You will receive a telephone call from one of our [facility] team members - your caller ID may say "Unknown caller." If this is a video visit, we will walk you through how to get the video launched on your phone. We will remind you check your blood pressure, heart rate and weight prior to your scheduled appointment. If you have an Apple Watch or Kardia, please upload any pertinent ECG strips the day before or morning of your appointment to MyChart. Our staff will also make sure you have reviewed the consent and agree to move forward with your scheduled tele-health visit.  ° ° ° °THE DAY OF YOUR APPOINTMENT ° °Approximately 15 minutes prior to your scheduled appointment, you will receive a telephone call from one of [facility] team - your caller ID may say "Unknown caller."    Our staff will confirm medications, vital signs for the day and any symptoms you may be experiencing. Please have this information available prior to the time of visit start. It may also be helpful for you to have a pad of paper and pen handy for any instructions given during your visit. They will also walk you through joining the smartphone meeting if this is a video visit.    CONSENT FOR TELE-HEALTH VISIT - PLEASE REVIEW  I hereby voluntarily request, consent and authorize [facility] and its employed or contracted physicians, physician  assistants, nurse practitioners or other licensed health care professionals (the Practitioner), to provide me with telemedicine health care services (the "Services") as deemed necessary by the treating Practitioner. I acknowledge and consent to receive the Services by the Practitioner via telemedicine. I understand that the telemedicine visit will involve communicating with the Practitioner through live audiovisual communication technology and the disclosure of certain medical information by electronic transmission. I acknowledge that I have been given the opportunity to request an in-person assessment or other available alternative prior to the telemedicine visit and am voluntarily participating in the telemedicine visit.  I understand that I have the right to withhold or withdraw my consent to the use of telemedicine in the course of my care at any time, without affecting my right to future care or treatment, and that the Practitioner or I may terminate the telemedicine visit at any time. I understand that I have the right to inspect all information obtained and/or recorded in the course of the telemedicine visit and may receive copies of available information for a reasonable fee.  I understand that some of the potential risks of receiving the Services via telemedicine include:  . Delay or interruption in medical evaluation due to technological equipment failure or disruption; . Information transmitted may not be sufficient (e.g. poor resolution of images) to allow for appropriate medical decision making by the Practitioner; and/or  . In rare instances, security protocols could fail, causing a breach of personal health information.  Furthermore, I acknowledge that it is my responsibility to provide information about my medical history, conditions and care that is complete and accurate to the best of my ability. I acknowledge that Practitioner's advice, recommendations, and/or decision may be based on  factors not within their control, such as incomplete or inaccurate data provided by me or distortions of diagnostic images or specimens that may result from electronic transmissions. I understand that the practice of medicine is not an exact science and that Practitioner makes no warranties or guarantees regarding treatment outcomes. I acknowledge that I will receive a copy of this consent concurrently upon execution via email to the email address I last provided but may also request a printed copy by calling the office of [facility].    I understand that my insurance will be billed for this visit.   I have read or had this consent read to me. . I understand the contents of this consent, which adequately explains the benefits and risks of the Services being provided via telemedicine.  . I have been provided ample opportunity to ask questions regarding this consent and the Services and have had my questions answered to my satisfaction. . I give my informed consent for the services to be provided through the use of telemedicine in my medical care  By participating in this telemedicine visit I agree to the above.  

## 2019-02-17 NOTE — Progress Notes (Unsigned)
Virtual Visit via Telephone Note   This visit type was conducted due to national recommendations for restrictions regarding the COVID-19 Pandemic (e.g. social distancing) in an effort to limit this patient's exposure and mitigate transmission in our community.  Due to her co-morbid illnesses, this patient is at least at moderate risk for complications without adequate follow up.  This format is felt to be most appropriate for this patient at this time.  The patient did not have access to video technology/had technical difficulties with video requiring transitioning to audio format only (telephone).  All issues noted in this document were discussed and addressed.  No physical exam could be performed with this format.  Please refer to the patient's chart for her  consent to telehealth for [facility]  Evaluation Performed:  Follow-up visit  This visit type was conducted due to national recommendations for restrictions regarding the COVID-19 Pandemic (e.g. social distancing).  This format is felt to be most appropriate for this patient at this time.  All issues noted in this document were discussed and addressed.  No physical exam was performed (except for noted visual exam findings with Video Visits).  Please refer to the patient's chart (MyChart message for video visits and phone note for telephone visits) for the patient's consent to telehealth for [facility].  Date:  02/17/2019   ID:  Brooke [name], DOB 08-22-1950, MRN [id number]  Patient Location:  Home  Provider location:   [place]  PCP:  Biagio Borg, MD  Cardiologist:  Fransico Him, MD  Electrophysiologist:  None Chief Complaint:  Followup of HTN, HLD, syncope and polymorphic VT  History of Present Illness:    Brooke [name] is a 68 y.o. female who presents via audio/video conferencing for a telehealth visit today.    Brooke [name] is a 68 y.o. female with a history of HTN, dyslipidemia, DM and syncope. She also has a  history of polymorphic VT with a normal cardiac MRI with normal EF 73%. Lexiscan myoview showed no ischemia. She subsequently underwent left heart cath in 2015 which showed normal coronary arteries and normal LVF. She was started on Toprol and was evaluated by Dr. Caryl Comes for her polymorphic VT that was felt to be triggered by PVC's.  She had a normal coronary CTA a year ago for CP and calcium score was 0.    She is here today for followup and is doing well.  She denies any chest pain or pressure, SOB, DOE, PND, orthopnea, LE edema, dizziness, palpitations or syncope. She is compliant with her meds and is tolerating meds with no SE.    The patient does not have symptoms concerning for COVID-19 infection (fever, chills, cough, or new shortness of breath).    Prior CV studies:   The following studies were reviewed today:  Coronary CTA 2019  Past Medical History:  Diagnosis Date  . Alopecia   . Aortic atherosclerosis ([place])   . Asthma    FOLLOWED BY PCP  . Eczema   . GERD (gastroesophageal reflux disease)   . Gout    04-28-2018--- per pt stable , as been a while since last episode  . History of colon polyps   . History of syncope 2015  . Hyperlipidemia   . Hypertension   . Hypothyroidism   . OA (osteoarthritis) of knee    bilateral  . PVC's (premature ventricular contractions)   . Toxic multinodular goiter    03/ 2003  s/p RAI  .  Type 2 diabetes mellitus ([place])    followed by pcp  . Ventricular tachycardia, polymorphic ([place]) 01/04/2014   primary cardiologist-- dr Tressia Miners Makeba Delcastillo (hx monitor 2015 showed couplet PVCs, as trigger)  . Wears glasses   . Wears partial dentures    upper   Past Surgical History:  Procedure Laterality Date  . ABDOMINAL HYSTERECTOMY  10/22/1999   WITH BSO  . CATARACT EXTRACTION W/ INTRAOCULAR LENS IMPLANT Left YRS AGO  . EXCISION ABDOMINAL WALL MASS  12-20-2005   dr Margot Chimes @[facility]    neruofibroma  . LAPAROSCOPIC CHOLECYSTECTOMY  10/01/2002   dr Margot Chimes  @[facility]   . LEFT HEART CATHETERIZATION WITH CORONARY ANGIOGRAM N/A 01/07/2014   Procedure: LEFT HEART CATHETERIZATION WITH CORONARY ANGIOGRAM;  Surgeon: Peter M Martinique, MD;  Location: [facility] CATH LAB;  Service: Cardiovascular;  Laterality: N/A;  . RASTELLI PROCEDURE  6/98 neg  . RENAL ARTERY STENT Left 11/2003   angioplasty and stenting  . RENAL ARTERY STENT Left 02/2005   re-stenting  . TOTAL KNEE ARTHROPLASTY Right 05/05/2018   Procedure: RIGHT TOTAL KNEE ARTHROPLASTY;  Surgeon: Frederik Pear, MD;  Location: [facility] ORS;  Service: Orthopedics;  Laterality: Right;  . TOTAL KNEE ARTHROPLASTY Left 09/30/2018   Procedure: TOTAL KNEE ARTHROPLASTY;  Surgeon: Melrose Nakayama, MD;  Location: [facility] ORS;  Service: Orthopedics;  Laterality: Left;     Current Meds  Medication Sig  . albuterol (VENTOLIN HFA) 108 (90 Base) MCG/ACT inhaler Inhale 2 puffs into the lungs every 6 (six) hours as needed for wheezing.  Marland Kitchen allopurinol (ZYLOPRIM) 100 MG tablet TAKE 1 TABLET BY MOUTH EVERY DAY  . AMBULATORY NON FORMULARY MEDICATION Medication Name: Rolling walker  . amLODipine (NORVASC) 10 MG tablet TAKE 1 TABLET BY MOUTH EVERY DAY  . citalopram (CELEXA) 20 MG tablet Take 1 tablet (20 mg total) by mouth daily.  . clonazePAM (KLONOPIN) 0.5 MG tablet Take 1 tablet (0.5 mg total) by mouth 2 (two) times daily as needed (vertigo).  . gabapentin (NEURONTIN) 100 MG capsule TAKE 1 CAPSULE (100 MG TOTAL) BY MOUTH AT BEDTIME.  . hydrochlorothiazide (HYDRODIURIL) 25 MG tablet TAKE 1 TABLET BY MOUTH EVERY DAY  . ibuprofen (ADVIL) 800 MG tablet TAKE 1 TABLET BY MOUTH TWICE A DAY AS NEEDED FOR PAIN  . meclizine (ANTIVERT) 25 MG tablet Take 1 tablet (25 mg total) by mouth 3 (three) times daily as needed for dizziness.  . metFORMIN (GLUCOPHAGE-XR) 500 MG 24 hr tablet TAKE ONE TABLET BY MOUTH ONCE DAILY WITH  BREAKFAST  . metoprolol succinate (TOPROL-XL) 100 MG 24 hr tablet Take 100 mg by mouth 2 (two) times daily.  Marland Kitchen omeprazole (PRILOSEC) 20 MG  capsule Take 1 capsule (20 mg total) by mouth 2 (two) times daily.  . potassium chloride (KLOR-CON 10) 10 MEQ tablet Take 1 tablet (10 mEq total) by mouth 2 (two) times daily.  . rosuvastatin (CRESTOR) 20 MG tablet TAKE 1 TABLET BY MOUTH EVERY DAY  . tiZANidine (ZANAFLEX) 2 MG tablet Take 1-2 tablets (2-4 mg total) by mouth every 6 (six) hours as needed for muscle spasms.  . Vitamin D, Ergocalciferol, (DRISDOL) 1.25 MG (50000 UT) CAPS capsule Take 1 capsule (50,000 Units total) by mouth every 7 (seven) days.  Marland Kitchen zolpidem (AMBIEN) 10 MG tablet TAKE 1 TABLET (10 MG TOTAL) BY MOUTH AT BEDTIME AS NEEDED FOR SLEEP.     Allergies:   Ace inhibitors, Codeine, Hydrocodone, Tramadol, and Tylenol [acetaminophen]   Social History   Tobacco Use  . Smoking status: Never Smoker  .  Smokeless tobacco: Never Used  Substance Use Topics  . Alcohol use: No  . Drug use: Never     Family Hx: The patient's family history includes Diabetes in her mother; Heart disease in her father.  ROS:   Please see the history of present illness.     All other systems reviewed and are negative.   Labs/Other Tests and Data Reviewed:    Recent Labs: 07/30/2018: Magnesium 1.7 01/27/2019: ALT 9; BUN 24; Creatinine, Ser 1.27; Hemoglobin 11.0; Platelets 268.0; Potassium 4.1; Pro B Natriuretic peptide (BNP) 152.0; Sodium 140; TSH 2.35   Recent Lipid Panel Lab Results  Component Value Date/Time   CHOL 138 01/27/2019 11:37 AM   CHOL 128 03/26/2018 01:56 PM   TRIG 85.0 01/27/2019 11:37 AM   HDL 65.70 01/27/2019 11:37 AM   HDL 52 03/26/2018 01:56 PM   CHOLHDL 2 01/27/2019 11:37 AM   LDLCALC 55 01/27/2019 11:37 AM   LDLCALC 60 03/26/2018 01:56 PM   LDLDIRECT 139.1 09/05/2009 10:52 AM    Wt Readings from Last 3 Encounters:  01/27/19 169 lb (76.7 kg)  09/30/18 156 lb (70.8 kg)  09/24/18 156 lb (70.8 kg)     Objective:    Vital Signs:  There were no vitals taken for this visit.    ASSESSMENT & PLAN:    1.   Polymorphic VT -secondary to PVCs -cardiac MRI/2D echo/cath 2015 and coronary CTA 2019 normal. -denies any palpitations since last OV. -continue on BB  2.  Hypertension -BP is controlled at home -continue on Toprol XL 100mg  BID, HCTZ 25mg  daily and amlodipine 10mg  daily.   -creatinine stable at 1.27 earlier this month and K+ 4.1.   3.  Syncope -this was in association with PMVT and has not occurred since then  COVID-19 Education: The signs and symptoms of COVID-19 were discussed with the patient and how to seek care for testing (follow up with PCP or arrange E-visit).  The importance of social distancing was discussed today.  Patient Risk:   After full review of this patient's clinical status, I feel that they are at least moderate risk at this time.  Time:   Today, I have spent *** minutes directly with the patient on telephone discussing medical problems including PMVT, HTN.  We also reviewed the symptoms of COVID 19 and the ways to protect against contracting the virus with telehealth technology.  I spent an additional 5 minutes reviewing patient's chart including coronary CTA from 2019.  Medication Adjustments/Labs and Tests Ordered: Current medicines are reviewed at length with the patient today.  Concerns regarding medicines are outlined above.  Tests Ordered: No orders of the defined types were placed in this encounter.  Medication Changes: No orders of the defined types were placed in this encounter.   Disposition:  Follow up in 1 year(s)  Signed, Fransico Him, MD  02/17/2019 9:49 PM    [place]

## 2019-02-18 ENCOUNTER — Encounter: Payer: Self-pay | Admitting: *Deleted

## 2019-02-18 ENCOUNTER — Other Ambulatory Visit: Payer: Self-pay

## 2019-02-18 ENCOUNTER — Telehealth: Payer: Medicare Other | Admitting: Cardiology

## 2019-02-18 ENCOUNTER — Other Ambulatory Visit: Payer: Self-pay | Admitting: *Deleted

## 2019-02-18 NOTE — Patient Instructions (Signed)

## 2019-02-18 NOTE — Patient Outreach (Signed)
[facility]) Care Management  [place]  02/18/2019   Brooke [name] 02-May-1951 [id number]   RN Health Coach Initial Assessment  Referral Date:  01/26/2019 Referral Source:  EMMI Prevent Screening Reason for Referral:  Disease Management Education Insurance:  [employer]   Outreach Attempt:  Successful telephone outreach to patient for introduction and initial telephone assessment.  HIPAA verified with patient.  RN Health Coach introduced self and role.  Patient verbally agrees to Disease Management outreaches.  Patient completed initial telephone assessment.  Social:  Patient lives at home with husband, son, daughter in law, and grandchildren.  Independent with ADLs and independent with IADLs except unable to drive.  Uses [employer] for transportation to medical appointments or her daughter in law takes her.  Ambulates with cane and reports a few falls in the past year.  States she has not fallen since her last knee replacement surgery.  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  Does state this is stressful and would like to get assistance caring for him or have him placed in home.  Continues to state that she has been told this can not happen at this time due to the pandemic (facilities are not taking patient's at this time per Ms. Petrik).  Voices she will await ending of pandemic to have patient reevaluated.  DME in the home include:  Straight cane, rolling walker, eyeglasses, upper dentures, bedside commode, blood pressure cuff, and a CBG meter.  Conditions:  Per chart review and discussion with patient, PMH include but not limited to:  Asthma, eczema, GERD, gout, hyperlipidemia, hypertension, hypothyroidism, diabetes, ventricular tachycardia, left cataract extraction, cholecystectomy, left renal artery stenosis with stenting, and bilateral knee arthroplasty.  Denies any recent hospitalizations or emergency room visits.  Endorses she is due for a  thyroid biopsy on 03/10/2019.  Also states she has been having severe headaches and is scheduled for a MRI this coming Sunday.  Monitors blood sugars about twice a week.  Fasting blood sugar yesterday was 98 with fasting ranges in the 90's.  Hgb A1C was 5.6 on 01/27/2019.  Monitors blood pressures every other day.  Blood pressure this morning was 148/88, with patient stating this is what her blood pressure normally ranges if not higher at times depending on what she eats.  Reviewed and discussed importance of low sodium diabetic diet.  Medications:  Patient reports taking about 12 medications.  Manages medications herself with weekly pill box fills.  Denies any issues with affording medications.  Encounter Medications:  Outpatient Encounter Medications as of 02/18/2019  Medication Sig  . albuterol (VENTOLIN HFA) 108 (90 Base) MCG/ACT inhaler Inhale 2 puffs into the lungs every 6 (six) hours as needed for wheezing.  Marland Kitchen allopurinol (ZYLOPRIM) 100 MG tablet TAKE 1 TABLET BY MOUTH EVERY DAY  . amLODipine (NORVASC) 10 MG tablet TAKE 1 TABLET BY MOUTH EVERY DAY  . citalopram (CELEXA) 20 MG tablet Take 1 tablet (20 mg total) by mouth daily.  . clonazePAM (KLONOPIN) 0.5 MG tablet Take 1 tablet (0.5 mg total) by mouth 2 (two) times daily as needed (vertigo).  . gabapentin (NEURONTIN) 100 MG capsule TAKE 1 CAPSULE (100 MG TOTAL) BY MOUTH AT BEDTIME.  . hydrochlorothiazide (HYDRODIURIL) 25 MG tablet TAKE 1 TABLET BY MOUTH EVERY DAY  . ibuprofen (ADVIL) 800 MG tablet TAKE 1 TABLET BY MOUTH TWICE A DAY AS NEEDED FOR PAIN  . meclizine (ANTIVERT) 25 MG tablet Take 1 tablet (25 mg total) by mouth  3 (three) times daily as needed for dizziness.  . metFORMIN (GLUCOPHAGE-XR) 500 MG 24 hr tablet TAKE ONE TABLET BY MOUTH ONCE DAILY WITH  BREAKFAST  . metoprolol succinate (TOPROL-XL) 100 MG 24 hr tablet Take 100 mg by mouth 2 (two) times daily.  Marland Kitchen omeprazole (PRILOSEC) 20 MG capsule Take 1 capsule (20 mg total) by mouth 2  (two) times daily.  . potassium chloride (KLOR-CON 10) 10 MEQ tablet Take 1 tablet (10 mEq total) by mouth 2 (two) times daily.  . rosuvastatin (CRESTOR) 20 MG tablet TAKE 1 TABLET BY MOUTH EVERY DAY  . tiZANidine (ZANAFLEX) 2 MG tablet Take 1-2 tablets (2-4 mg total) by mouth every 6 (six) hours as needed for muscle spasms.  . Vitamin D, Ergocalciferol, (DRISDOL) 1.25 MG (50000 UT) CAPS capsule Take 1 capsule (50,000 Units total) by mouth every 7 (seven) days.  Marland Kitchen zolpidem (AMBIEN) 10 MG tablet TAKE 1 TABLET (10 MG TOTAL) BY MOUTH AT BEDTIME AS NEEDED FOR SLEEP.  Marland Kitchen AMBULATORY NON FORMULARY MEDICATION Medication Name: Rolling walker   No facility-administered encounter medications on file as of 02/18/2019.     Functional Status:  In your present state of health, do you have any difficulty performing the following activities: 02/18/2019 09/30/2018  Hearing? N N  Vision? Y N  Comment trouble seeing out of left eye -  Difficulty concentrating or making decisions? N N  Walking or climbing stairs? N N  Dressing or bathing? N N  Doing errands, shopping? N N  Preparing Food and eating ? N -  Using the Toilet? N -  In the past six months, have you accidently leaked urine? N -  Do you have problems with loss of bowel control? N -  Managing your Medications? N -  Managing your Finances? N -  Housekeeping or managing your Housekeeping? N -  Some recent data might be hidden    Fall/Depression Screening: Fall Risk  02/18/2019 01/27/2019 04/08/2018  Falls in the past year? 1 0 Yes  Number falls in past yr: 1 - 2 or more  Injury with Fall? 0 - No  Risk Factor Category  - - High Fall Risk  Risk for fall due to : History of fall(s);Medication side effect;Impaired balance/gait;Impaired mobility;Impaired vision - Impaired mobility;Impaired balance/gait  Follow up Falls evaluation completed;Education provided;Falls prevention discussed - Falls prevention discussed   PHQ 2/9 Scores 02/18/2019 01/27/2019  01/26/2019 04/08/2018 01/21/2018 07/19/2017 01/10/2017  PHQ - 2 Score 0 0 0 3 0 2 4  PHQ- 9 Score - - - 9 0 - 13   THN CM Care Plan Problem One     Most Recent Value  Care Plan Problem One  Knowledge deficiet related to Diabetes  Role Documenting the Problem One  [place] for Problem One  Active  THN Long Term Goal   Patient will maintain Hgb A1C of 6 or below within the next 6 months.  THN Long Term Goal Start Date  02/18/19  Interventions for Problem One Long Term Goal  Care plan reviewed and discussed with patient, reviewed medications and indications and encouraged medication compliance, encouraged to keep and attend scheduled medical appointments, congratulated patient on current Hgb A1C and discussed ways to keep at goal, discussed and encouraged low sodium/low carbohydrate diet with healthier drink and meal options, reviewed signs and symptoms of hypo and hyperglycemia, sending Living Well with Diabetes booklet, encouraged to continue to monitor blood sugars and blood pressures, sending 2020 Calendar Booklet  to track appointments and document vital sign readings from home     Appointments:  Patient attended appointment with primary care provider, Dr. Jenny Reichmann on 01/27/2019.  Typically sees primary care yearly unless needing to see more frequently.  Has scheduled thyroid biopsy on 03/10/2019.  Advanced Directives:  Does not have Advance Directive in place.  Would like information to create one.   Consent:  [facility] services reviewed and discussed.  Patient verbally agrees to Disease Management outreaches.  Plan: RN Health Coach will send primary MD barriers letter. RN Health Coach will route initial telephone assessment note to primary MD. [place] will send patient [place]. RN Health Coach will send patient Living Well with Diabetes Educational Packet. RN Health Coach will send patient 2020 Calendar Booklet. RN Health Coach will send patient Sales promotion account executive. RN Health Coach will send patient Stroke Prevention Education. RN Health Coach will make next telephone outreach to patient within the month of August.  Megan Presti RN [place] [phone] [email or web address]

## 2019-02-22 ENCOUNTER — Ambulatory Visit
Admission: RE | Admit: 2019-02-22 | Discharge: 2019-02-22 | Disposition: A | Discharge status: Home / Self Care | Payer: Medicare Other | Source: Ambulatory Visit | Attending: Internal Medicine | Admitting: Internal Medicine

## 2019-02-22 ENCOUNTER — Encounter: Payer: Self-pay | Admitting: Internal Medicine

## 2019-02-22 ENCOUNTER — Other Ambulatory Visit: Payer: Self-pay

## 2019-02-22 DIAGNOSIS — R519 Headache, unspecified: Secondary | ICD-10-CM

## 2019-02-22 DIAGNOSIS — R51 Headache: Secondary | ICD-10-CM | POA: Diagnosis not present

## 2019-03-02 ENCOUNTER — Telehealth: Payer: Self-pay

## 2019-03-02 NOTE — Telephone Encounter (Signed)
Pt has been informed of MRI results  Copied from [place] [id number]. Topic: General - Inquiry >> Mar 02, 2019 10:08 AM Virl Axe D wrote: Reason for CRM: Pt would like a callback for someone to go over her MRI results. She does not understand all the medical terminology. Please return call.

## 2019-03-05 ENCOUNTER — Telehealth: Payer: Self-pay

## 2019-03-05 NOTE — Telephone Encounter (Signed)
Spoke with pt to schedule follow up appt with Dr. Radford Pax. Pt has agreed to have phone call visit and will have vitals ready for appt. Medications and pharmacy reviewed.

## 2019-03-06 ENCOUNTER — Telehealth (INDEPENDENT_AMBULATORY_CARE_PROVIDER_SITE_OTHER): Payer: Medicare Other | Admitting: Cardiology

## 2019-03-06 ENCOUNTER — Other Ambulatory Visit: Payer: Self-pay

## 2019-03-06 ENCOUNTER — Encounter: Payer: Self-pay | Admitting: Cardiology

## 2019-03-06 VITALS — BP 146/83 | HR 62 | Ht 63.0 in | Wt 168.0 lb

## 2019-03-06 DIAGNOSIS — I4729 Other ventricular tachycardia: Secondary | ICD-10-CM

## 2019-03-06 DIAGNOSIS — E7800 Pure hypercholesterolemia, unspecified: Secondary | ICD-10-CM

## 2019-03-06 DIAGNOSIS — R079 Chest pain, unspecified: Secondary | ICD-10-CM

## 2019-03-06 DIAGNOSIS — I10 Essential (primary) hypertension: Secondary | ICD-10-CM

## 2019-03-06 DIAGNOSIS — I472 Ventricular tachycardia: Secondary | ICD-10-CM | POA: Diagnosis not present

## 2019-03-06 NOTE — Patient Instructions (Signed)

## 2019-03-06 NOTE — Progress Notes (Signed)
Virtual Visit via Video Note   This visit type was conducted due to national recommendations for restrictions regarding the COVID-19 Pandemic (e.g. social distancing) in an effort to limit this patient's exposure and mitigate transmission in our community.  Due to her co-morbid illnesses, this patient is at least at moderate risk for complications without adequate follow up.  This format is felt to be most appropriate for this patient at this time.  All issues noted in this document were discussed and addressed.  A limited physical exam was performed with this format.  Please refer to the patient's chart for her consent to telehealth for [facility].  Evaluation Performed:  Follow-up visit  This visit type was conducted due to national recommendations for restrictions regarding the COVID-19 Pandemic (e.g. social distancing).  This format is felt to be most appropriate for this patient at this time.  All issues noted in this document were discussed and addressed.  No physical exam was performed (except for noted visual exam findings with Video Visits).  Please refer to the patient's chart (MyChart message for video visits and phone note for telephone visits) for the patient's consent to telehealth for [facility].  Date:  03/06/2019   ID:  Brooke [name], DOB Aug 16, 1950, MRN [id number]  Patient Location:  Home  Provider location:   [place]  PCP:  Biagio Borg, MD  Cardiologist:  Fransico Him, MD  Electrophysiologist:  None   Chief Complaint:  She  History of Present Illness:    Brooke [name] is a 68 y.o. female who presents via audio/video conferencing for a telehealth visit today.    Brooke [name] is a 68 y.o. female with history of PVCs, polymorphic VT, HTN, dyslipidemia (followed by primary care), DM, syncope, aortic atherosclerosis.  Her Cardiac MRI was normal in 2015 and EF 73%.  Lexiscan myoview showed no ischemia.  Cath 2015 showed normal coronary arteries.  Her PMVT  was felt to be triggered by PVCs.  She also has HTN.  Repeat nuclear stress test 12/2016 for CP showed small moderate intensity anteroapical defect that was fixed and felt consistent with apical thinning, low risk study, EF 62%.  2D Echo 01/22/18 showed moderate LVH, EF 60-65%, grade 1 DD. Coronary CT  For ongoing CP showed normal coronaries (did show scattered calcification in aorta).  She is here today for followup and is doing well.  She denies any chest pain or pressure, SOB, DOE, PND, orthopnea, LE edema, dizziness, palpitations or syncope. She is compliant with her meds and is tolerating meds with no SE.  She tells me that she was recently found to have a nodule on her thyroid and is getting a biopsy next week.    The patient does not have symptoms concerning for COVID-19 infection (fever, chills, cough, or new shortness of breath).    Prior CV studies:   The following studies were reviewed today:  none  Past Medical History:  Diagnosis Date  . Alopecia   . Aortic atherosclerosis ([place])   . Asthma    FOLLOWED BY PCP  . Eczema   . GERD (gastroesophageal reflux disease)   . Gout    04-28-2018--- per pt stable , as been a while since last episode  . History of colon polyps   . History of syncope 2015  . Hyperlipidemia   . Hypertension   . Hypothyroidism   . OA (osteoarthritis) of knee    bilateral  . PVC's (premature ventricular contractions)   .  Toxic multinodular goiter    03/ 2003  s/p RAI  . Type 2 diabetes mellitus ([place])    followed by pcp  . Ventricular tachycardia, polymorphic ([place]) 01/04/2014   primary cardiologist-- dr Tressia Miners  (hx monitor 2015 showed couplet PVCs, as trigger)  . Wears glasses   . Wears partial dentures    upper   Past Surgical History:  Procedure Laterality Date  . ABDOMINAL HYSTERECTOMY  10/22/1999   WITH BSO  . CATARACT EXTRACTION W/ INTRAOCULAR LENS IMPLANT Left YRS AGO  . EXCISION ABDOMINAL WALL MASS  12-20-2005   dr Margot Chimes @[facility]     neruofibroma  . LAPAROSCOPIC CHOLECYSTECTOMY  10/01/2002   dr Margot Chimes @[facility]   . LEFT HEART CATHETERIZATION WITH CORONARY ANGIOGRAM N/A 01/07/2014   Procedure: LEFT HEART CATHETERIZATION WITH CORONARY ANGIOGRAM;  Surgeon: Peter M Martinique, MD;  Location: [facility] CATH LAB;  Service: Cardiovascular;  Laterality: N/A;  . RASTELLI PROCEDURE  6/98 neg  . RENAL ARTERY STENT Left 11/2003   angioplasty and stenting  . RENAL ARTERY STENT Left 02/2005   re-stenting  . TOTAL KNEE ARTHROPLASTY Right 05/05/2018   Procedure: RIGHT TOTAL KNEE ARTHROPLASTY;  Surgeon: Frederik Pear, MD;  Location: [facility] ORS;  Service: Orthopedics;  Laterality: Right;  . TOTAL KNEE ARTHROPLASTY Left 09/30/2018   Procedure: TOTAL KNEE ARTHROPLASTY;  Surgeon: Melrose Nakayama, MD;  Location: [facility] ORS;  Service: Orthopedics;  Laterality: Left;     Current Meds  Medication Sig  . albuterol (VENTOLIN HFA) 108 (90 Base) MCG/ACT inhaler Inhale 2 puffs into the lungs every 6 (six) hours as needed for wheezing.  Marland Kitchen allopurinol (ZYLOPRIM) 100 MG tablet TAKE 1 TABLET BY MOUTH EVERY DAY  . AMBULATORY NON FORMULARY MEDICATION Medication Name: Rolling walker  . amLODipine (NORVASC) 10 MG tablet Take 10 mg by mouth 2 (two) times daily.  . citalopram (CELEXA) 20 MG tablet Take 1 tablet (20 mg total) by mouth daily.  . clonazePAM (KLONOPIN) 0.5 MG tablet Take 1 tablet (0.5 mg total) by mouth 2 (two) times daily as needed (vertigo).  . gabapentin (NEURONTIN) 100 MG capsule TAKE 1 CAPSULE (100 MG TOTAL) BY MOUTH AT BEDTIME.  . hydrochlorothiazide (HYDRODIURIL) 25 MG tablet TAKE 1 TABLET BY MOUTH EVERY DAY  . ibuprofen (ADVIL) 800 MG tablet TAKE 1 TABLET BY MOUTH TWICE A DAY AS NEEDED FOR PAIN  . meclizine (ANTIVERT) 25 MG tablet Take 1 tablet (25 mg total) by mouth 3 (three) times daily as needed for dizziness.  . metFORMIN (GLUCOPHAGE-XR) 500 MG 24 hr tablet TAKE ONE TABLET BY MOUTH ONCE DAILY WITH  BREAKFAST  . metoprolol succinate (TOPROL-XL) 100 MG 24 hr  tablet Take 100 mg by mouth 2 (two) times daily.  Marland Kitchen omeprazole (PRILOSEC) 20 MG capsule Take 1 capsule (20 mg total) by mouth 2 (two) times daily.  . potassium chloride (KLOR-CON 10) 10 MEQ tablet Take 1 tablet (10 mEq total) by mouth 2 (two) times daily.  . rosuvastatin (CRESTOR) 20 MG tablet TAKE 1 TABLET BY MOUTH EVERY DAY  . tiZANidine (ZANAFLEX) 2 MG tablet Take 1-2 tablets (2-4 mg total) by mouth every 6 (six) hours as needed for muscle spasms.  . Vitamin D, Ergocalciferol, (DRISDOL) 1.25 MG (50000 UT) CAPS capsule Take 1 capsule (50,000 Units total) by mouth every 7 (seven) days.  Marland Kitchen zolpidem (AMBIEN) 10 MG tablet TAKE 1 TABLET (10 MG TOTAL) BY MOUTH AT BEDTIME AS NEEDED FOR SLEEP.     Allergies:   Ace inhibitors, Codeine, Hydrocodone, Tramadol, and Tylenol [  acetaminophen]   Social History   Tobacco Use  . Smoking status: Never Smoker  . Smokeless tobacco: Never Used  Substance Use Topics  . Alcohol use: No  . Drug use: Never     Family Hx: The patient's family history includes Diabetes in her mother; Heart disease in her father.  ROS:   Please see the history of present illness.     All other systems reviewed and are negative.   Labs/Other Tests and Data Reviewed:    Recent Labs: 07/30/2018: Magnesium 1.7 01/27/2019: ALT 9; BUN 24; Creatinine, Ser 1.27; Hemoglobin 11.0; Platelets 268.0; Potassium 4.1; Pro B Natriuretic peptide (BNP) 152.0; Sodium 140; TSH 2.35   Recent Lipid Panel Lab Results  Component Value Date/Time   CHOL 138 01/27/2019 11:37 AM   CHOL 128 03/26/2018 01:56 PM   TRIG 85.0 01/27/2019 11:37 AM   HDL 65.70 01/27/2019 11:37 AM   HDL 52 03/26/2018 01:56 PM   CHOLHDL 2 01/27/2019 11:37 AM   LDLCALC 55 01/27/2019 11:37 AM   LDLCALC 60 03/26/2018 01:56 PM   LDLDIRECT 139.1 09/05/2009 10:52 AM    Wt Readings from Last 3 Encounters:  03/06/19 168 lb (76.2 kg)  01/27/19 169 lb (76.7 kg)  09/30/18 156 lb (70.8 kg)     Objective:    Vital Signs:  BP  (!) 146/83   Pulse 62   Ht 5\' 3"  (1.6 m)   Wt 168 lb (76.2 kg)   BMI 29.76 kg/m    CONSTITUTIONAL:  Well nourished, well developed female in no acute distress.  EYES: anicteric MOUTH: oral mucosa is pink RESPIRATORY: Normal respiratory effort, symmetric expansion CARDIOVASCULAR: No peripheral edema SKIN: No rash, lesions or ulcers MUSCULOSKELETAL: no digital cyanosis NEURO: Cranial Nerves II-XII grossly intact, moves all extremities PSYCH: Intact judgement and insight.  A&O x 3, Mood/affect appropriate   ASSESSMENT & PLAN:    1.  Chest pain - this is noncardiac and has resolved.  She had a cath in 2015 that was normal and a coronary CTA last year that showed no CAD.  She may need surgery for a thyroid nodule in the near future if bx proves malignant.  Her revised cardiac risk score is 0.4% and is considered low risk for surgery.   2.  Hypertension - her BP is borderline controlled on exam today.  She will continue on Toprol XL 100mg  daily, HCTZ 25mg  daily and Amlodipine 10mg  daily.  Her last creatinine was stable at 1.27 and K+ 4.1.    3.  PVCs/PMVT - she has not had any palpitations since we saw her last.  She will continue on BB for suppression of PVCs which have been the trigger for her VT in the past.   4.  Hyperlipidemia - her LDL has been at goal with last reading 55 in July 2020.  She will continue on Crestor 20mg  daily.    5.  Aortic atherosclerosis - continue risk factor modification with statin therapy.    COVID-19 Education: The signs and symptoms of COVID-19 were discussed with the patient and how to seek care for testing (follow up with PCP or arrange E-visit).  The importance of social distancing was discussed today.  Patient Risk:   After full review of this patient's clinical status, I feel that they are at least moderate risk at this time.  Time:   Today, I have spent 15 minutes directly with the patient on telemedicine discussing medical problems including CP,  HTN, VT, Lipids.  We also reviewed the symptoms of COVID 19 and the ways to protect against contracting the virus with telehealth technology.  I spent an additional 5 minutes reviewing patient's chart including labs.  Medication Adjustments/Labs and Tests Ordered: Current medicines are reviewed at length with the patient today.  Concerns regarding medicines are outlined above.  Tests Ordered: No orders of the defined types were placed in this encounter.  Medication Changes: No orders of the defined types were placed in this encounter.   Disposition:  Follow up in 1 year(s)  Signed, Fransico Him, MD  03/06/2019 8:48 AM    [facility]

## 2019-03-10 ENCOUNTER — Ambulatory Visit
Admission: RE | Admit: 2019-03-10 | Discharge: 2019-03-10 | Disposition: A | Discharge status: Home / Self Care | Payer: Medicare Other | Source: Ambulatory Visit | Attending: Internal Medicine | Admitting: Internal Medicine

## 2019-03-10 ENCOUNTER — Other Ambulatory Visit (HOSPITAL_COMMUNITY)
Admission: RE | Admit: 2019-03-10 | Discharge: 2019-03-10 | Disposition: A | Discharge status: Home / Self Care | Payer: Medicare Other | Source: Ambulatory Visit | Attending: Diagnostic Radiology | Admitting: Diagnostic Radiology

## 2019-03-10 DIAGNOSIS — E041 Nontoxic single thyroid nodule: Secondary | ICD-10-CM | POA: Diagnosis not present

## 2019-03-10 DIAGNOSIS — E0789 Other specified disorders of thyroid: Secondary | ICD-10-CM | POA: Diagnosis not present

## 2019-03-10 DIAGNOSIS — E042 Nontoxic multinodular goiter: Secondary | ICD-10-CM | POA: Diagnosis not present

## 2019-03-11 ENCOUNTER — Encounter: Payer: Self-pay | Admitting: Internal Medicine

## 2019-03-17 ENCOUNTER — Other Ambulatory Visit: Payer: Self-pay | Admitting: *Deleted

## 2019-03-17 DIAGNOSIS — E041 Nontoxic single thyroid nodule: Secondary | ICD-10-CM | POA: Diagnosis not present

## 2019-03-17 NOTE — Patient Outreach (Signed)
[facility]) Care Management  03/17/2019  CHRISANNA HINKSON September 07, 1950 [id number]   RN Health Coach Monthly Outreach  Referral Date:  01/26/2019 Referral Source:  EMMI Prevent Screening Reason for Referral:  Disease Management Education Insurance:  [employer]   Outreach Attempt:  Outreach attempt #1 to patient for follow up.  Patient answered and stated she is unable to speak at this time and requested a telephone call back.   Plan:  RN Health Coach will make another outreach attempt within the month of September per patient request.   Hubert Azure RN [place] [phone] [email or web address]

## 2019-03-23 DIAGNOSIS — E119 Type 2 diabetes mellitus without complications: Secondary | ICD-10-CM | POA: Diagnosis not present

## 2019-03-27 ENCOUNTER — Encounter (HOSPITAL_COMMUNITY): Payer: Self-pay

## 2019-03-30 ENCOUNTER — Other Ambulatory Visit: Payer: Self-pay | Admitting: Internal Medicine

## 2019-04-01 ENCOUNTER — Ambulatory Visit: Payer: Medicare Other

## 2019-04-03 ENCOUNTER — Other Ambulatory Visit: Payer: Self-pay | Admitting: *Deleted

## 2019-04-03 ENCOUNTER — Encounter: Payer: Self-pay | Admitting: *Deleted

## 2019-04-03 NOTE — Patient Outreach (Signed)
[facility]) Care Management  04/03/2019  ANNALIZE CLEGHORN 1950-12-07 [id number]   [place] Monthly Outreach  Referral Date: 01/26/2019 Referral Source: EMMI Prevent Screening Reason for Referral: Disease Management Education Insurance: [employer]   Outreach Attempt:  Successful telephone outreach to patient for follow up. HIPAA verified with patient.  Patient reporting she is doing ok.  Has had her testing (MRI) and thyroid biopsy completed.  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  [facility] Social Work discussed for caregiver burnout and patient verbally agrees and is interested in resources for [employer].  Reports fasting blood sugar was 102 this morning and blood pressure was 146/82.  Appointments:  Attended appointment with primary care provider on 01/27/2019 and needs to schedule follow up appointment.  Had telehealth visit with Cardiologist on 03/06/2019.  Plan: RN Health Coach will make [facility] Social Worker referral for caregiver burnout resources and respite care. RN Health Coach will make next telephone outreach to patient with in the month of November.  [place] [phone] [email or web address]

## 2019-04-08 ENCOUNTER — Other Ambulatory Visit: Payer: Self-pay | Admitting: Internal Medicine

## 2019-04-14 ENCOUNTER — Other Ambulatory Visit: Payer: Self-pay

## 2019-04-14 NOTE — Patient Outreach (Signed)
[facility]) Care Management  04/14/2019  SAPHYRA BOST 02-26-1951 [id number]   Social work referral received from [employer], [employer].  "Patient needs assistance with Caregiver Burnout and possible Respite Care for her husband with history of stroke and colostomy--she is the primary caregiver. Successful outreach to patient today.  Patient is primary caregiver for spouse.  They live with son and his wife but all family members have health issues of their own that make it difficult to care for him.   Discussed in-home aide services as well as various day programs, however, spouse does not qualify for Medicaid and family cannot afford to privately pay for services.  Informed patient of respite program with [facility] but unsure of criteria for services.  Agreed to gather more information about program and call her back. Spoke to [employer] at [employer].  Based on information provided, it seems that patient's spouse may be a good candidate for this service.  [facility] also has other programs that may be of benefit to caregivers.  Roselyn requested that patient call her to further discuss respite program as well as other supports the center can provide.  Attempted to call patient back to provide her with contact information for Roselyn but had to leave message.  Will attempt to reach her again within four business days.   Ronn Melena, BSW Social Worker [phone]

## 2019-04-15 ENCOUNTER — Ambulatory Visit: Payer: Self-pay

## 2019-04-15 ENCOUNTER — Other Ambulatory Visit: Payer: Self-pay | Admitting: Internal Medicine

## 2019-04-15 ENCOUNTER — Other Ambulatory Visit: Payer: Self-pay

## 2019-04-15 NOTE — Patient Outreach (Addendum)
[facility]) Care Management  04/15/2019  KAYDIENCE CAUDLE Nov 29, 1950 [id number]   Second attempt to follow up with patient to provide possible resource for respites services.  Left voicemail message and mailed unsuccessful outreach letter.   Will attempt to reach again within four business days.  Addendum: Received return call from patient.  Provided her with contact information for Herma Mering [phone]. [phone]) with Tax [employer] as spouse may be eligible for respite program.  Roselyn also wanted to discuss other programs with patient.   Closing case but did encourage patient to call if additional questions or needs arise.     Ronn Melena, BSW Social Worker [phone]

## 2019-04-17 ENCOUNTER — Ambulatory Visit: Payer: Self-pay
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
## 2019-04-30 ENCOUNTER — Ambulatory Visit (INDEPENDENT_AMBULATORY_CARE_PROVIDER_SITE_OTHER): Payer: Medicare Other | Admitting: *Deleted

## 2019-04-30 DIAGNOSIS — Z0000 Encounter for general adult medical examination without abnormal findings: Secondary | ICD-10-CM

## 2019-05-10 ENCOUNTER — Other Ambulatory Visit: Payer: Self-pay | Admitting: Internal Medicine

## 2019-05-13 ENCOUNTER — Ambulatory Visit
Admission: RE | Admit: 2019-05-13 | Discharge: 2019-05-13 | Disposition: A | Discharge status: Home / Self Care | Payer: Medicare Other | Source: Ambulatory Visit | Attending: Internal Medicine | Admitting: Internal Medicine

## 2019-05-13 ENCOUNTER — Other Ambulatory Visit: Payer: Self-pay

## 2019-05-13 DIAGNOSIS — Z1231 Encounter for screening mammogram for malignant neoplasm of breast: Secondary | ICD-10-CM

## 2019-05-14 ENCOUNTER — Other Ambulatory Visit: Payer: Self-pay | Admitting: Internal Medicine

## 2019-05-19 ENCOUNTER — Other Ambulatory Visit: Payer: Self-pay | Admitting: Internal Medicine

## 2019-05-22 DIAGNOSIS — Z96653 Presence of artificial knee joint, bilateral: Secondary | ICD-10-CM | POA: Diagnosis not present

## 2019-05-22 DIAGNOSIS — M25562 Pain in left knee: Secondary | ICD-10-CM | POA: Diagnosis not present

## 2019-05-28 ENCOUNTER — Other Ambulatory Visit: Payer: Self-pay | Admitting: *Deleted

## 2019-05-28 NOTE — Patient Outreach (Signed)
[place] [facility]) Care Management  05/28/2019  Brooke [name] 1950-09-16 [id number]   RN Health Coach Monthly Outreach  Referral Date: 01/26/2019 Referral Source: EMMI Prevent Screening Reason for Referral: Disease Management Education Insurance: [employer]   Outreach Attempt:  Outreach attempt #1 to patient for follow up. No answer. RN Health Coach left HIPAA compliant voicemail message along with contact information.  Plan:  RN Health Coach will make another outreach attempt within the month of December if no return call back from message left.  [place] [phone] [name]

## 2019-06-16 ENCOUNTER — Ambulatory Visit (INDEPENDENT_AMBULATORY_CARE_PROVIDER_SITE_OTHER): Payer: Medicare Other | Admitting: Internal Medicine

## 2019-06-16 DIAGNOSIS — J309 Allergic rhinitis, unspecified: Secondary | ICD-10-CM | POA: Diagnosis not present

## 2019-06-16 DIAGNOSIS — J4531 Mild persistent asthma with (acute) exacerbation: Secondary | ICD-10-CM | POA: Diagnosis not present

## 2019-06-16 DIAGNOSIS — R05 Cough: Secondary | ICD-10-CM

## 2019-06-16 DIAGNOSIS — J45901 Unspecified asthma with (acute) exacerbation: Secondary | ICD-10-CM | POA: Insufficient documentation

## 2019-06-16 DIAGNOSIS — R059 Cough, unspecified: Secondary | ICD-10-CM

## 2019-06-16 MED ORDER — HYDROCODONE-HOMATROPINE 5-1.5 MG/5ML PO SYRP: 5.0000 mL | ORAL_SOLUTION | Freq: Four times a day (QID) | ORAL | 0 refills | Status: AC | PRN

## 2019-06-16 MED ORDER — PREDNISONE 10 MG PO TABS: ORAL_TABLET | ORAL | 0 refills | Status: DC

## 2019-06-16 MED ORDER — FLUTICASONE-SALMETEROL 250-50 MCG/DOSE IN AEPB: 1.0000 | INHALATION_SPRAY | Freq: Two times a day (BID) | RESPIRATORY_TRACT | 11 refills | Status: DC

## 2019-06-16 MED ORDER — FEXOFENADINE HCL 180 MG PO TABS: 180.0000 mg | ORAL_TABLET | Freq: Every day | ORAL | 11 refills | Status: DC

## 2019-06-16 MED ORDER — TRIAMCINOLONE ACETONIDE 55 MCG/ACT NA AERO: 2.0000 | INHALATION_SPRAY | Freq: Every day | NASAL | 12 refills | Status: DC

## 2019-06-16 NOTE — Patient Instructions (Signed)
Please take all new medication as prescribed - the cough medicine for a short time only, prednisone, allegra, nasacort, and the new steroid inhaler as directed  Please continue all other medications as before, and refills have been done if requested.  Please have the pharmacy call with any other refills you may need.  Please keep your appointments with your specialists as you may have planned

## 2019-06-16 NOTE — Progress Notes (Signed)
Patient ID: [name], female   DOB: [birth year], 68 y.o.   MRN: [id number]  Phone visit  Cumulative time during 7-day interval 12 min, there was not an associated office visit for this concern within a 7 day period.  Verbal consent for services obtained from patient prior to services given.  Names of all persons present for services: [name], MD, patient  Chief complaint: allergies and asthma  History, background, results pertinent:   Here with c/o worsening allergies/asthma in the last 2 mo with chronic cough where mucinex and robitussin DM does not help; Does have several wks ongoing nasal allergy symptoms with clearish congestion, itch and sneezing, without fever, pain, ST swelling, but overall gradually worsening and sneezing all the time, and more recently increased wheezing sob/doe.   Pt denies fever, wt loss, night sweats, loss of appetite, or other constitutional symptoms  Pt denies new neurological symptoms such as new headache, or facial or extremity weakness or numbness   Past Medical History:  Diagnosis Date  . Alopecia   . Aortic atherosclerosis ([place])   . Asthma    FOLLOWED BY PCP  . Eczema   . GERD (gastroesophageal reflux disease)   . Gout    04-28-2018--- per pt stable , as been a while since last episode  . History of colon polyps   . History of syncope 2015  . Hyperlipidemia   . Hypertension   . Hypothyroidism   . OA (osteoarthritis) of knee    bilateral  . PVC's (premature ventricular contractions)   . Toxic multinodular goiter    03/ 2003  s/p RAI  . Type 2 diabetes mellitus ([place])    followed by pcp  . Ventricular tachycardia, polymorphic ([place]) 01/04/2014   primary cardiologist-- dr Tressia Miners turner (hx monitor 2015 showed couplet PVCs, as trigger)  . Wears glasses   . Wears partial dentures    upper   No results found for this or any previous visit (from the past 48 hour(s)).  Lab Results  Component Value Date   WBC 7.0 01/27/2019   HGB 11.0 (L)  01/27/2019   HCT 34.2 (L) 01/27/2019   PLT 268.0 01/27/2019   GLUCOSE 104 (H) 01/27/2019   CHOL 138 01/27/2019   TRIG 85.0 01/27/2019   HDL 65.70 01/27/2019   LDLDIRECT 139.1 09/05/2009   LDLCALC 55 01/27/2019   ALT 9 01/27/2019   AST 13 01/27/2019   NA 140 01/27/2019   K 4.1 01/27/2019   CL 106 01/27/2019   CREATININE 1.27 (H) 01/27/2019   BUN 24 (H) 01/27/2019   CO2 25 01/27/2019   TSH 2.35 01/27/2019   INR 1.0 09/24/2018   HGBA1C 5.6 01/27/2019   MICROALBUR 4.0 (H) 01/27/2019   A/P/next steps:   1)  Allergy - for allegra, nasacort, predpac asd and cough med prn  2)  Asthma - mild uncontrolled, for wixhela asd,  to f/u any worsening symptoms or concerns  [name] MD

## 2019-06-20 ENCOUNTER — Encounter: Payer: Self-pay | Admitting: Internal Medicine

## 2019-06-20 NOTE — Assessment & Plan Note (Signed)
See notes

## 2019-06-26 ENCOUNTER — Ambulatory Visit: Payer: Self-pay | Admitting: *Deleted

## 2019-06-26 ENCOUNTER — Other Ambulatory Visit: Payer: Self-pay | Admitting: *Deleted

## 2019-06-26 MED ORDER — GUAIFENESIN-DM 100-10 MG/5ML PO SYRP: 5.0000 mL | ORAL_SOLUTION | ORAL | 0 refills | Status: DC | PRN

## 2019-06-26 NOTE — Patient Outreach (Signed)
[place] [facility]) Care Management  06/26/2019  Brooke [name] Feb 18, 1951 [id number]   RN Health Coach Monthly Outreach  Referral Date: 01/26/2019 Referral Source: EMMI Prevent Screening Reason for Referral: Disease Management Education Insurance: [employer]   Outreach Attempt:  Outreach attempt #2 to patient for follow up.  Patient answered and stated she was unable to speak at this time and requested call back.    Plan:  RN Health Coach will make another outreach attempt within the month of December per patient request.  Brooke Azure RN [name] [phone] [name]

## 2019-06-26 NOTE — Telephone Encounter (Signed)
Message from Esaw Dace sent at 06/26/2019 9:34 AM EST  Summary: Med management   Pt stated she is allergic to codeine but was prescribed HYDROcodone-homatropine (HYCODAN) 5-1.5 MG/5ML syrup She took it last night and starting sweating and itching. Stated she did not take the entire 5ML dose. She has not taken it again and would like to know if she can go back to her robotussin.          Patient was seen for an office visit on 06/16/19 and was prescribed Hycodan to help with cough. Pt was hesitant to take the medication, due to an allergy to Codeine, but took the first does last night. Pt states she only took 72ml of the medication and started sweating and itching. Pt states today she is still having some itching but has been taking benadryl. Pt states she has not have improvement in her cough since her visit and is seems to worsen at night. Pt is not going to take Hycodan again but wants to know if she can take robitussin or if another cough medication can be recommended.   Reason for Disposition . [1] Caller has URGENT medication question about med that PCP or specialist prescribed AND [2] triager unable to answer question  Answer Assessment - Initial Assessment Questions 1.   NAME of MEDICATION: "What medicine are you calling about?"     Hycodan 2.   QUESTION: "What is your question?"     Pt wants to stop taking medication and wants to know if she can take robitussin 3.   PRESCRIBING HCP: "Who prescribed it?" Reason: if prescribed by specialist, call should be referred to that group.     Dr. Jenny Reichmann 4. SYMPTOMS: "Do you have any symptoms?"    Sweating and itching.still having itching now  Protocols used: MEDICATION QUESTION CALL-A-AH

## 2019-06-26 NOTE — Addendum Note (Signed)
Addended by: Biagio Borg on: 06/26/2019 09:31 PM   Modules accepted: Orders

## 2019-06-26 NOTE — Telephone Encounter (Signed)
Ok for robitussin DM -= done erx 

## 2019-06-29 DIAGNOSIS — Z1159 Encounter for screening for other viral diseases: Secondary | ICD-10-CM | POA: Diagnosis not present

## 2019-07-03 ENCOUNTER — Other Ambulatory Visit: Payer: Self-pay | Admitting: Internal Medicine

## 2019-07-03 NOTE — Telephone Encounter (Signed)
Done erx 

## 2019-07-07 ENCOUNTER — Other Ambulatory Visit: Payer: Self-pay | Admitting: Internal Medicine

## 2019-07-07 DIAGNOSIS — M1000 Idiopathic gout, unspecified site: Secondary | ICD-10-CM

## 2019-07-09 ENCOUNTER — Other Ambulatory Visit: Payer: Self-pay | Admitting: Internal Medicine

## 2019-07-14 ENCOUNTER — Other Ambulatory Visit: Payer: Self-pay | Admitting: *Deleted

## 2019-07-14 NOTE — Patient Outreach (Signed)
[place] [facility]) Care Management  07/14/2019  Brooke [name] 1950-11-19 [id number]   RN Health Coach Monthly Outreach  Referral Date: 01/26/2019 Referral Source: EMMI Prevent Screening Reason for Referral: Disease Management Education Insurance: [employer]   Outreach Attempt:  Outreach attempt #3 to patient for follow up.  Patient answered and stated she was resting and requested a call back after the holidays.   Plan:  RN Health Coach will make another outreach attempt within the month of January per patient's request.  Brooke Azure RN [place] [phone] [name]

## 2019-07-23 ENCOUNTER — Other Ambulatory Visit: Payer: Self-pay | Admitting: Internal Medicine

## 2019-07-23 NOTE — Telephone Encounter (Signed)
Please refill as per office policy

## 2019-07-29 ENCOUNTER — Telehealth: Payer: Self-pay

## 2019-07-29 NOTE — Telephone Encounter (Signed)
Copied from [place] [phone]. Topic: General - Other >> Jul 29, 2019  3:04 PM Leward Quan A wrote: Reason for CRM: Barbaraann Barthel with [facility] called to inform Dr Jenny Reichmann that pateint scored a 16 on her PHQ9 test. Just wanted to inform the doctor

## 2019-08-11 ENCOUNTER — Other Ambulatory Visit: Payer: Self-pay | Admitting: *Deleted

## 2019-08-11 NOTE — Patient Outreach (Signed)
[place] [facility]) Care Management  08/11/2019  CIPRIANA SPIVEY September 17, 1950 [id number]   RN Health Coach Monthly Outreach  Referral Date: 01/26/2019 Referral Source: EMMI Prevent Screening Reason for Referral: Disease Management Education Insurance: [employer]    Outreach Attempt:  Outreach attempt #4 to patient for follow up. No answer. RN Health Coach left HIPAA compliant voicemail message along with contact information.  Plan:  RN Health Coach will make another outreach attempt within the month of February if no return call back from patient.  [place] [phone] [email or web address] [email or web address]

## 2019-08-18 ENCOUNTER — Other Ambulatory Visit: Payer: Self-pay | Admitting: Internal Medicine

## 2019-09-04 ENCOUNTER — Other Ambulatory Visit: Payer: Self-pay

## 2019-09-04 ENCOUNTER — Encounter: Payer: Self-pay | Admitting: Internal Medicine

## 2019-09-04 ENCOUNTER — Ambulatory Visit (INDEPENDENT_AMBULATORY_CARE_PROVIDER_SITE_OTHER): Payer: Medicare Other | Admitting: Internal Medicine

## 2019-09-04 DIAGNOSIS — I10 Essential (primary) hypertension: Secondary | ICD-10-CM | POA: Diagnosis not present

## 2019-09-04 DIAGNOSIS — J309 Allergic rhinitis, unspecified: Secondary | ICD-10-CM

## 2019-09-04 DIAGNOSIS — J4521 Mild intermittent asthma with (acute) exacerbation: Secondary | ICD-10-CM

## 2019-09-04 DIAGNOSIS — E119 Type 2 diabetes mellitus without complications: Secondary | ICD-10-CM

## 2019-09-04 MED ORDER — HYDROCODONE-HOMATROPINE 5-1.5 MG/5ML PO SYRP: 5.0000 mL | ORAL_SOLUTION | Freq: Four times a day (QID) | ORAL | 0 refills | Status: AC | PRN

## 2019-09-04 MED ORDER — FLUTICASONE-SALMETEROL 250-50 MCG/DOSE IN AEPB: 1.0000 | INHALATION_SPRAY | Freq: Two times a day (BID) | RESPIRATORY_TRACT | 11 refills | Status: DC

## 2019-09-04 MED ORDER — ALBUTEROL SULFATE HFA 108 (90 BASE) MCG/ACT IN AERS: 2.0000 | INHALATION_SPRAY | Freq: Four times a day (QID) | RESPIRATORY_TRACT | 11 refills | Status: DC | PRN

## 2019-09-04 MED ORDER — FEXOFENADINE HCL 180 MG PO TABS: 180.0000 mg | ORAL_TABLET | Freq: Every day | ORAL | 11 refills | Status: DC

## 2019-09-04 MED ORDER — TRIAMCINOLONE ACETONIDE 55 MCG/ACT NA AERO: 2.0000 | INHALATION_SPRAY | Freq: Every day | NASAL | 12 refills | Status: DC

## 2019-09-04 MED ORDER — PREDNISONE 10 MG PO TABS: ORAL_TABLET | ORAL | 0 refills | Status: DC

## 2019-09-04 NOTE — Assessment & Plan Note (Signed)
Mild to mod, for allegra and nasacort asd,   to f/u any worsening symptoms or concerns 

## 2019-09-04 NOTE — Assessment & Plan Note (Signed)
stable overall by history and exam, recent data reviewed with pt, and pt to continue medical treatment as before,  to f/u any worsening symptoms or concerns  

## 2019-09-04 NOTE — Patient Instructions (Signed)
Please take all new medication as prescribed 

## 2019-09-04 NOTE — Progress Notes (Signed)
Patient ID: Brooke [name], female   DOB: Apr 04, 1951, 69 y.o.   MRN: [id number]  Virtual Visit via Video Note  I connected with [name] on 09/04/19 at  3:20 PM EST by a video enabled telemedicine application and verified that I am speaking with the correct person using two identifiers.  Location: Patient: at home Provider: at office    I discussed the limitations of evaluation and management by telemedicine and the availability of in person appointments. The patient expressed understanding and agreed to proceed.  History of Present Illness: Here to f/u; overall doing ok,  Pt denies chest pain, orthopnea, PND, increased LE swelling, palpitations, dizziness or syncope.  Pt denies new neurological symptoms such as new headache, or facial or extremity weakness or numbness.  Pt denies polydipsia, polyuria  Does have several wks ongoing nasal allergy symptoms with clearish congestion, itch and sneezing, without fever, pain, ST, cough, swelling but has had milw worsening sob  And wheezing as well Past Medical History:  Diagnosis Date  . Alopecia   . Aortic atherosclerosis ([place])   . Asthma    FOLLOWED BY PCP  . Eczema   . GERD (gastroesophageal reflux disease)   . Gout    04-28-2018--- per pt stable , as been a while since last episode  . History of colon polyps   . History of syncope 2015  . Hyperlipidemia   . Hypertension   . Hypothyroidism   . OA (osteoarthritis) of knee    bilateral  . PVC's (premature ventricular contractions)   . Toxic multinodular goiter    03/ 2003  s/p RAI  . Type 2 diabetes mellitus ([place])    followed by pcp  . Ventricular tachycardia, polymorphic ([place]) 01/04/2014   primary cardiologist-- dr Tressia Miners turner (hx monitor 2015 showed couplet PVCs, as trigger)  . Wears glasses   . Wears partial dentures    upper   Past Surgical History:  Procedure Laterality Date  . ABDOMINAL HYSTERECTOMY  10/22/1999   WITH BSO  . CATARACT EXTRACTION W/ INTRAOCULAR LENS  IMPLANT Left YRS AGO  . EXCISION ABDOMINAL WALL MASS  12-20-2005   dr Margot Chimes @[facility]    neruofibroma  . LAPAROSCOPIC CHOLECYSTECTOMY  10/01/2002   dr Margot Chimes @[facility]   . LEFT HEART CATHETERIZATION WITH CORONARY ANGIOGRAM N/A 01/07/2014   Procedure: LEFT HEART CATHETERIZATION WITH CORONARY ANGIOGRAM;  Surgeon: Peter M Martinique, MD;  Location: [facility] CATH LAB;  Service: Cardiovascular;  Laterality: N/A;  . RASTELLI PROCEDURE  6/98 neg  . RENAL ARTERY STENT Left 11/2003   angioplasty and stenting  . RENAL ARTERY STENT Left 02/2005   re-stenting  . TOTAL KNEE ARTHROPLASTY Right 05/05/2018   Procedure: RIGHT TOTAL KNEE ARTHROPLASTY;  Surgeon: Frederik Pear, MD;  Location: [facility] ORS;  Service: Orthopedics;  Laterality: Right;  . TOTAL KNEE ARTHROPLASTY Left 09/30/2018   Procedure: TOTAL KNEE ARTHROPLASTY;  Surgeon: Melrose Nakayama, MD;  Location: [facility] ORS;  Service: Orthopedics;  Laterality: Left;    reports that she has never smoked. She has never used smokeless tobacco. She reports that she does not drink alcohol or use drugs. family history includes Diabetes in her mother; Heart disease in her father. Allergies  Allergen Reactions  . Ace Inhibitors Swelling       . Codeine Nausea And Vomiting and Rash  . Hydrocodone Nausea And Vomiting  . Tramadol Nausea And Vomiting  . Tylenol [Acetaminophen] Itching and Rash   Current Outpatient Medications on File Prior to Visit  Medication Sig  Dispense Refill  . allopurinol (ZYLOPRIM) 100 MG tablet TAKE 1 TABLET BY MOUTH EVERY DAY 90 tablet 3  . AMBULATORY NON FORMULARY MEDICATION Medication Name: Rolling walker 1 Device 0  . amLODipine (NORVASC) 10 MG tablet Take 10 mg by mouth 2 (two) times daily.    . citalopram (CELEXA) 20 MG tablet TAKE 1 TABLET BY MOUTH EVERY DAY 90 tablet 3  . clonazePAM (KLONOPIN) 0.5 MG tablet Take 1 tablet (0.5 mg total) by mouth 2 (two) times daily as needed (vertigo). 30 tablet 2  . gabapentin (NEURONTIN) 100 MG capsule TAKE 1 CAPSULE (100  MG TOTAL) BY MOUTH AT BEDTIME. 90 capsule 1  . hydrochlorothiazide (HYDRODIURIL) 25 MG tablet TAKE 1 TABLET BY MOUTH EVERY DAY 90 tablet 3  . ibuprofen (ADVIL) 800 MG tablet TAKE 1 TABLET BY MOUTH TWICE A DAY AS NEEDED FOR PAIN 60 tablet 2  . KLOR-CON M20 20 MEQ tablet TAKE 1 TABLET BY MOUTH TWICE A DAY 180 tablet 1  . meclizine (ANTIVERT) 25 MG tablet TAKE 1 TABLET BY MOUTH 3 TIMES DAILY AS NEEDED FOR DIZZINESS. 30 tablet 5  . metFORMIN (GLUCOPHAGE-XR) 500 MG 24 hr tablet TAKE ONE TABLET BY MOUTH ONCE DAILY WITH BREAKFAST 90 tablet 3  . metoprolol succinate (TOPROL-XL) 100 MG 24 hr tablet Take 1 tablet (100 mg total) by mouth 2 (two) times daily. 180 tablet 3  . omeprazole (PRILOSEC) 20 MG capsule TAKE 1 CAPSULE BY MOUTH TWICE A DAY 180 capsule 3  . potassium chloride (KLOR-CON 10) 10 MEQ tablet Take 1 tablet (10 mEq total) by mouth 2 (two) times daily. 60 tablet 11  . rosuvastatin (CRESTOR) 20 MG tablet TAKE 1 TABLET BY MOUTH EVERY DAY 90 tablet 1  . zolpidem (AMBIEN) 10 MG tablet TAKE 1 TABLET BY MOUTH AT BEDTIME AS NEEDED FOR SLEEP. 90 tablet 1   No current facility-administered medications on file prior to visit.    Observations/Objective: Alert, NAD, appropriate mood and affect, resps normal, cn 2-12 intact, moves all 4s, no visible rash or swelling Lab Results  Component Value Date   WBC 7.0 01/27/2019   HGB 11.0 (L) 01/27/2019   HCT 34.2 (L) 01/27/2019   PLT 268.0 01/27/2019   GLUCOSE 104 (H) 01/27/2019   CHOL 138 01/27/2019   TRIG 85.0 01/27/2019   HDL 65.70 01/27/2019   LDLDIRECT 139.1 09/05/2009   LDLCALC 55 01/27/2019   ALT 9 01/27/2019   AST 13 01/27/2019   NA 140 01/27/2019   K 4.1 01/27/2019   CL 106 01/27/2019   CREATININE 1.27 (H) 01/27/2019   BUN 24 (H) 01/27/2019   CO2 25 01/27/2019   TSH 2.35 01/27/2019   INR 1.0 09/24/2018   HGBA1C 5.6 01/27/2019   MICROALBUR 4.0 (H) 01/27/2019   Assessment and Plan: See notes  Follow Up Instructions: See notes   I  discussed the assessment and treatment plan with the patient. The patient was provided an opportunity to ask questions and all were answered. The patient agreed with the plan and demonstrated an understanding of the instructions.   The patient was advised to call back or seek an in-person evaluation if the symptoms worsen or if the condition fails to improve as anticipated.  [name], MD

## 2019-09-04 NOTE — Assessment & Plan Note (Addendum)
With mild exacerbation, for predpac asd, albut prn, and generic advair 250/50 bid,  to f/u any worsening symptoms or concerns  I spent 31 minutes in preparing to see the patient by review of recent labs, imaging and procedures, obtaining and reviewing separately obtained history, communicating with the patient and family or caregiver, ordering medications, tests or procedures, and documenting clinical information in the EHR including the differential Dx, treatment, and any further evaluation and other management of asthma exacerbation, allergic rhinitis, DM, HTN

## 2019-09-08 ENCOUNTER — Telehealth: Payer: Self-pay | Admitting: Internal Medicine

## 2019-09-08 NOTE — Telephone Encounter (Signed)
    Patient calling to report she has stopped taking predniSONE (DELTASONE) 10 MG tablet and HYDROcodone-homatropine (HYCODAN) 5-1.5 MG/5ML syrup Patient feels these medication are causing her blood sugar and BP to be higher

## 2019-09-08 NOTE — Telephone Encounter (Signed)
The prednisone can do this, but we only need a short course of the prednisone, then the sugar and BP go back to the previous

## 2019-09-09 NOTE — Telephone Encounter (Signed)
Called and spoke with patient discussed message

## 2019-09-11 ENCOUNTER — Encounter: Payer: Self-pay | Admitting: *Deleted

## 2019-09-11 ENCOUNTER — Other Ambulatory Visit: Payer: Self-pay | Admitting: *Deleted

## 2019-09-11 NOTE — Patient Outreach (Signed)
Brooke [name] Brooke [name]) Care Management  09/11/2019  Brooke [name] [birth year] [id number]   CSW was able to make initial contact with patient today to perform phone assessment, as well as assess and assist with social work needs and services.  CSW introduced self, explained role and types of services provided through [facility] ([place] [facility]).  CSW further explained to patient that CSW works with patient's Telephonic RNCM, also with [place] [facility], [place].  CSW then explained the reason for the call, indicating that Brooke [name] thought that patient would benefit from social work services and resources to assist with a referral for counseling and supportive services for symptoms of depression.  CSW obtained two HIPAA compliant identifiers from patient, which included patient's name and date of birth.  Patient admitted to experiencing symptoms of depression, all related to caregiver burden and stress.  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  Patient further reported that she is responsible for performing all of the grocery shopping, running all the errands, preparing and cooking all the meals, doing all the laundry, paying all the bills, etc.  Patient stated, "All the responsibility falls on me because my son has tumors on his spine, so he's not able to lift anything, my daughter just had back surgery for slip discs, so she's not able to lift anything, we don't have any family that lives nearby to help out, and we certainly can't afford to pay somebody to come in and help".    Patient indicated that she is now beginning to worry about her own health and well-being, experiencing pain in both her knees, despite having total knee replacements in 2019 and 2020, and believes that she may have  pulled a muscle in her back while lifting her husband onto the bed pan.  CSW inquired as to whether or not patient would be interested in pursing long-term care placement arrangements for her husband into an assisted living facility, but patient denied, reporting having already tried that.  Patient admitted that she had to walk outside to talk with CSW on the phone, not wanting her husband or son to overhear her conversation with CSW, afraid that their feelings would be hurt if they knew that she was referring to them as being "burdens".  CSW offered to call patient back at a more convenient time, but patient declined.  Patient verbalized that she has tried applying for Adult Medicaid, as well as [place] Medicaid, both through the [place], on five different occasions, but was denied every time.  Patient went on to say that her and her husband's combined [place] far exceeds the guidelines for the [place].  Patient stated, "We make too much money to qualify for government assistance, but not enough money to be able to afford home care services or assisted living".  Patient was not interested in having CSW send her a list of assisted living facilities, nor was patient interested in receiving a list of in-home care and respite agencies.  CSW briefly spoke with patient about Adult Day Care Programs for her husband, agreeing to revisit the topic once COVID-19 restrictions have been lifted.  CSW explained to patient that CSW is worried about patient's mental health and stability, emphasizing to her that her own needs  and feelings matter.  CSW also tried to validate patient's worth, acknowledging her role in her husband's life, as well as the amount of effort that she makes to ensure that her family's needs are being met.  CSW encouraged patient to try and maintain a life outside of her caregiver role, and to try and take  better care of her own health.  CSW offered to provide patient with a list of caregiver resources, in addition to local caregiver support groups, in an attempt to try and meet her own emotional needs, but patient did not appear to be interested, admitting that she is a very private person, not wanting to "air her business for all the world to hear".  Patient was agreeable; however, to CSW providing her with a list of therapists.     Patient is aware that CSW will be mailing her the following list of resources: [facility] the [place]; List of [employer] in [place]; List of Therapists & Psychiatrists in [place], [place]. [place] agreed to assist patient with the referral process, if necessary.  Patient reported that she would give it some consideration, not wanting to commit to anything today.  CSW voiced understanding, also agreeing to provide patient with free telephonic counseling services, at least until COVID-19 restrictions have been lifted and CSW is able to meet with patient face-to-face, or until CSW is able to assist patient with getting established with a long-term therapist in the community.  CSW will also be mailing patient the following EMMI information for her independent review: Advanced Directives; Coping with a Health Condition: Signs of Depression; Depression; Depression: Medication; Depression: Other Things You Can Do; Talk Therapy for Depression. Patient has been encouraged to continue to take her psychotropic medication, Citalopram (Celexa) 20 MG, PO, Daily, exactly as prescribed.  CSW also encouraged patient to communicate with her Primary Care Physician, Dr. [name], during her next scheduled appointment, if she would like for Dr. Jenny Reichmann to consider increasing her dosage or adding another anti-depressant medication, in conjunction with what she is already being prescribed.  CSW was able to review patient's PHQ-2 and PHQ-9 Depression Screening results  with her, wanting to confirm that patient is not experiencing any homicidal or suicidal ideations.  Patient denied, stating, "I would never harm myself".  CSW provided counseling and supportive services to patient today, in addition to teaching patient deep breathing exercises and relaxation techniques.  CSW agreed to follow-up with patient on Monday, September 21, 2019, around 10:00am, to ensure that she received the packet of resource information mailed to her home, answer any questions that she may have pertaining to the information, as well as assist with the referral process.  CSW confirmed that patient has the correct contact information for CSW, encouraging patient to contact CSW directly if additional social work needs arise or if patient is unable to manage her symptoms.  Nat Christen, BSW, MSW, LCSW  Licensed Education officer, [facility]  Mailing [place] [address] [place], [place] [address] Physical Address-[address], [place], [place] [address] Toll Free Main # [phone] Fax # [phone] Cell # [phone]  Office # [phone] [email or web address]

## 2019-09-11 NOTE — Patient Outreach (Signed)
[facility]) Care Management  [place]  09/11/2019   Brooke [name] 27-Jun-1951 [id number]   [place] Monthly Outreach   Referral Date:  01/26/2019 Referral Source:  EMMI Prevent Screening Reason for Referral:  Disease Management Education Insurance:  [employer]   Outreach Attempt:  Successful telephone outreach to patient for follow up.  Patient reporting she was COVID positive in December 2020 and currently still has cough.  Reports increase shortness of breath, needing her rescue inhaler at least 3-4 times a day.  Recently had Telehealth visit with primary care and was prescribed cough medicine, new maintenance inhaler, and prednisone taper.  States she is feeling some better since the start of the new medications.  Yesterdays fasting blood sugar was 144 with recent fasting ranges are 140-180's due to prednisone.  Prior to prednisone fasting blood sugar ranges were 90-110's.  Speaking with patient, patient acknowledges she is more sad and depressed than usual. Asking if her medication for depression could be increased.  Encouraged patient to contact provider and asked about increasing dosage of medication.  [facility] Social Work referral discussed for depression/counseling resources and patient agrees.  When asked about "Thoughts that you would be better off dead, or of hurting yourself in some way"; patient stated " I really don't want to answer that".  Asked patient directly if she had thoughts of harming herself or suicidal thoughts or suicidal plan, and patient stated no.  Patient acknowledges she has numbers to Suicide Hotline and agrees to contact them or this [place] if she has any suicidal thoughts or develops plan.  Encounter Medications:  Outpatient Encounter Medications as of 09/11/2019  Medication Sig  . albuterol (VENTOLIN HFA) 108 (90 Base) MCG/ACT inhaler Inhale 2 puffs into the lungs every 6 (six) hours as needed for wheezing or  shortness of breath.  . allopurinol (ZYLOPRIM) 100 MG tablet TAKE 1 TABLET BY MOUTH EVERY DAY  . amLODipine (NORVASC) 10 MG tablet Take 10 mg by mouth 2 (two) times daily.  . citalopram (CELEXA) 20 MG tablet TAKE 1 TABLET BY MOUTH EVERY DAY  . clonazePAM (KLONOPIN) 0.5 MG tablet Take 1 tablet (0.5 mg total) by mouth 2 (two) times daily as needed (vertigo).  . fexofenadine (ALLEGRA) 180 MG tablet Take 1 tablet (180 mg total) by mouth daily.  . Fluticasone-Salmeterol (WIXELA INHUB) 250-50 MCG/DOSE AEPB Inhale 1 puff into the lungs 2 (two) times daily.  Marland Kitchen gabapentin (NEURONTIN) 100 MG capsule TAKE 1 CAPSULE (100 MG TOTAL) BY MOUTH AT BEDTIME.  . hydrochlorothiazide (HYDRODIURIL) 25 MG tablet TAKE 1 TABLET BY MOUTH EVERY DAY  . HYDROcodone-homatropine (HYCODAN) 5-1.5 MG/5ML syrup Take 5 mLs by mouth every 6 (six) hours as needed for up to 10 days for cough.  Marland Kitchen ibuprofen (ADVIL) 800 MG tablet TAKE 1 TABLET BY MOUTH TWICE A DAY AS NEEDED FOR PAIN  . meclizine (ANTIVERT) 25 MG tablet TAKE 1 TABLET BY MOUTH 3 TIMES DAILY AS NEEDED FOR DIZZINESS.  . metFORMIN (GLUCOPHAGE-XR) 500 MG 24 hr tablet TAKE ONE TABLET BY MOUTH ONCE DAILY WITH BREAKFAST  . metoprolol succinate (TOPROL-XL) 100 MG 24 hr tablet Take 1 tablet (100 mg total) by mouth 2 (two) times daily.  Marland Kitchen omeprazole (PRILOSEC) 20 MG capsule TAKE 1 CAPSULE BY MOUTH TWICE A DAY  . potassium chloride (KLOR-CON 10) 10 MEQ tablet Take 1 tablet (10 mEq total) by mouth 2 (two) times daily.  . predniSONE (DELTASONE) 10 MG tablet 3 tabs  by mouth per day for 3 days,2tabs per day for 3 days,1 tab per day for 3 days  . rosuvastatin (CRESTOR) 20 MG tablet TAKE 1 TABLET BY MOUTH EVERY DAY  . triamcinolone (NASACORT) 55 MCG/ACT AERO nasal inhaler Place 2 sprays into the nose daily.  Marland Kitchen zolpidem (AMBIEN) 10 MG tablet TAKE 1 TABLET BY MOUTH AT BEDTIME AS NEEDED FOR SLEEP.  Marland Kitchen AMBULATORY NON FORMULARY MEDICATION Medication Name: Rolling walker  . KLOR-CON M20 20 MEQ  tablet TAKE 1 TABLET BY MOUTH TWICE A DAY   No facility-administered encounter medications on file as of 09/11/2019.    Functional Status:  In your present state of health, do you have any difficulty performing the following activities: 04/30/2019 02/18/2019  Hearing? N N  Vision? N Y  Comment - trouble seeing out of left eye  Difficulty concentrating or making decisions? N N  Walking or climbing stairs? N N  Dressing or bathing? N N  Doing errands, shopping? N N  Preparing Food and eating ? N N  Using the Toilet? N N  In the past six months, have you accidently leaked urine? Y N  Do you have problems with loss of bowel control? N N  Managing your Medications? N N  Managing your Finances? N N  Housekeeping or managing your Housekeeping? N N  Some recent data might be hidden    Fall/Depression Screening: Fall Risk  09/11/2019 04/30/2019 02/18/2019  Falls in the past year? 0 0 1  Number falls in past yr: 0 0 1  Injury with Fall? 0 0 0  Risk Factor Category  - - -  Risk for fall due to : Medication side effect;Impaired balance/gait;Impaired mobility Impaired balance/gait;Impaired mobility History of fall(s);Medication side effect;Impaired balance/gait;Impaired mobility;Impaired vision  Follow up Falls prevention discussed;Education provided;Falls evaluation completed Falls prevention discussed Falls evaluation completed;Education provided;Falls prevention discussed   PHQ 2/9 Scores 09/11/2019 04/30/2019 02/18/2019 01/27/2019 01/26/2019 04/08/2018 01/21/2018  PHQ - 2 Score 3 2 0 0 0 3 0  PHQ- 9 Score 14 7 - - - 9 0   THN CM Care Plan Problem One     Most Recent Value  Care Plan Problem One  Knowledge deficiet related to Diabetes and self care management of depression  Role Documenting the Problem One  [facility] for Problem One  Active  THN Long Term Goal   Patient will maintain Hgb A1C of 6 or below within the next 6 months.  THN Long Term Goal Start Date  09/11/19   Interventions for Problem One Long Term Goal  Care plan and goals reviewed and discussed, discussed how prednisone can affect blood sugars, reviewed medications and encouraged medication compliance, reviewed current blood sugar ranges and discussed ways to help reduce, encouraged to keep and attend scheduled medical appointments, sending 2021 Calendar Booklet to help with vital sign documentation  THN CM Short Term Goal #1   Patient will report less feelings of being depressed in the next 30 days.  THN CM Short Term Goal #1 Start Date  09/11/19  Interventions for Short Term Goal #1  Offered patient support and comfort for feelings of depression, confirmed patient not suicidal (denies suicidal thoughts or plan), [facility] Social Worker referral for depression resources and counseling, instructed patient to contact Suicide Hotline if she became suicidal, confirmed patient has numbers for depression/suicide resources  [facility] CM Short Term Goal #2   Patient will report less use of rescue inhaler in the next 30  days.  THN CM Short Term Goal #2 Start Date  09/11/19  Interventions for Short Term Goal #2  Confirmed patient using maintanence inhaler twice a day as prescribed, discussed use of rescue inhaler, confrimed patient continues on prednisone taper until medication is complete, encouraged patient to contact provider if shortness of breath or cough does not get any better, encouraged patient to discuss increasing medication for depression with primary care provider     Appointments:  Attended appointment with primary care provider, Dr. Jenny Reichmann on 09/04/2019.  Plan: RN Health Coach will place [facility] Social Work referral for depression counseling and resources. RN Health Coach will send primary care provider quarterly update. RN Health Coach will send patient 2021 Calendar Booklet. RN Health Coach will make next telephone outreach to patient within the month of March and patient agrees to future outreach.  [place] [phone] [email or web address]

## 2019-09-19 ENCOUNTER — Other Ambulatory Visit: Payer: Self-pay | Admitting: Internal Medicine

## 2019-09-19 NOTE — Telephone Encounter (Signed)
Please refill as per office routine med refill policy (all routine meds refilled for 3 mo or monthly per pt preference up to one year from last visit, then month to month grace period for 3 mo, then further med refills will have to be denied)  

## 2019-09-21 ENCOUNTER — Other Ambulatory Visit: Payer: Self-pay | Admitting: *Deleted

## 2019-09-21 ENCOUNTER — Ambulatory Visit: Payer: Self-pay | Admitting: *Deleted

## 2019-09-21 NOTE — Patient Outreach (Signed)
[facility]) Care Management  09/21/2019  Brooke [name] May 04, 1951 [id number]   CSW made an attempt to try and contact patient today to follow-up regarding social work services and resources, ensure that patient received the packet of resource information mailed to her home by CSW, as well as to provide counseling and supportive services for symptoms of anxiety and depression.  However, patient was unavailable at the time of CSW's call.  CSW left a HIPAA compliant message on voicemail for patient and is currently awaiting a return call.  CSW will make a second outreach attempt within the next 3-4 business days, if a return call is not received from patient in the meantime.    Nat Christen, BSW, MSW, LCSW  Licensed Education officer, environmental Health System  Mailing [place] [address] [place], [place] [address] Physical Address-[address], [place], [place] [address] Toll Free Main # [phone] Fax # [phone] Cell # [phone]  Office # [phone] [email or web address]

## 2019-09-23 ENCOUNTER — Other Ambulatory Visit: Payer: Self-pay

## 2019-09-23 MED ORDER — POTASSIUM CHLORIDE ER 10 MEQ PO TBCR: 10.0000 meq | EXTENDED_RELEASE_TABLET | Freq: Two times a day (BID) | ORAL | 11 refills | Status: DC

## 2019-09-23 MED ORDER — POTASSIUM CHLORIDE CRYS ER 20 MEQ PO TBCR: 20.0000 meq | EXTENDED_RELEASE_TABLET | Freq: Two times a day (BID) | ORAL | 1 refills | Status: DC

## 2019-09-23 MED ORDER — GABAPENTIN 100 MG PO CAPS: 100.0000 mg | ORAL_CAPSULE | Freq: Every day | ORAL | 1 refills | Status: DC

## 2019-09-25 ENCOUNTER — Telehealth: Payer: Self-pay | Admitting: Internal Medicine

## 2019-09-25 ENCOUNTER — Other Ambulatory Visit: Payer: Self-pay | Admitting: *Deleted

## 2019-09-25 NOTE — Telephone Encounter (Signed)
    Pt c/o medication issue:  1. Name of Medication: potassium chloride/ potassium chloride SA  2. How are you currently taking this medication (dosage and times per day)? n/a  3. Are you having a reaction (difficulty breathing--STAT)? no  4. What is your medication issue? Patient calling to clarify dosage

## 2019-09-25 NOTE — Telephone Encounter (Signed)
Patient contacted and she stated that she has been taking the potassium 20 meq. Informed pt to continue the dose she has been prescribed. Med list updated with correct medication strength.

## 2019-09-26 ENCOUNTER — Encounter: Payer: Self-pay | Admitting: *Deleted

## 2019-09-26 NOTE — Patient Outreach (Signed)
[facility]) Care Management  09/26/2019  Brooke [name] 07/19/1951 [id number]   CSW was able to make contact with patient today to follow-up regarding social work services and resources, as well as to ensure that patient received the packet of resource information mailed to her home by CSW.  Patient confirmed to receiving all of the following information:  [facility] the [place] [employer]; List of [employer] in [place]; List of Therapists & Psychiatrists in [place], [place]. and all of the following EMMI information:  Advanced Directives; Coping with a Health Condition: Signs of Depression; Depression; Depression: Medication; Depression: Other Things You Can Do; Talk Therapy for Depression.   Patient denied having questions about any of the information received, nor was patient interested in having CSW assist her with arranging mental health services.  Patient also denied having an interest in CSW providing telephonic counseling and supportive services, admitting that she is coping well and able to manage her own symptoms of depression and anxiety.  Patient was encouraged to continue to take her antidepressant medication exactly as prescribed and to contact CSW directly if she changes her mind about wanting to receive counseling services or if her symptoms become unmanageable.  Patient voiced understanding and was agreeable to this plan.    CSW will perform a case closure on patient, as all goals of treatment have been met from social work standpoint and no additional social work needs have been identified at this time.  CSW was able to confirm that patient has the correct contact information for CSW, encouraging patient to contact CSW if additional social work needs arise in the near future.  CSW will notify patient's Telephonic RNCM with [facility], Hubert Azure of CSW's plans to close patient's case.  CSW will fax an update to  patient's Primary Care Physician, Dr. [name] to ensure that he is aware of CSW's involvement with patient's plan of care.    Nat Christen, BSW, MSW, LCSW  Licensed Education officer, [facility]  Mailing [place] [address] [place], [place] [address] Physical Address-[address], [place], [place] [address] Toll Free Main # [phone] Fax # [phone] Cell # [phone]  Office # [phone] [email or web address]

## 2019-10-01 DIAGNOSIS — E119 Type 2 diabetes mellitus without complications: Secondary | ICD-10-CM | POA: Diagnosis not present

## 2019-10-07 ENCOUNTER — Encounter: Payer: Self-pay | Admitting: *Deleted

## 2019-10-07 ENCOUNTER — Other Ambulatory Visit: Payer: Self-pay | Admitting: *Deleted

## 2019-10-07 NOTE — Patient Outreach (Signed)
[facility]) Care Management  10/07/2019  MYASIA ALESSI 1950-12-04 [id number]   [place] Monthly Outreach  Referral Date: 01/26/2019 Referral Source: EMMI Prevent Screening Reason for Referral: Disease Management Education Insurance: [employer]   Outreach Attempt:  Successful telephone outreach to patient for follow up.  HIPAA verified with patient.  Patient continues to report high levels of stress and depression related to being caregiver for her husband and now her son and daughter needing assistance.  States she has contacted several resources from list provided and is not able to schedule follow up mainly due to insurance.  RN Health Coach provided patient with [place] Work contact number and encouraged her to call for telephonic counseling.  Stated she would.  Also reporting that her blood pressures have been elevated recently.  Blood pressure this morning was 168/88 with recent ranges of 160-190/80-90's.  Complaining of headache and shortness of breath when lying down.  States she has spoken with her primary care and cardiologist and has appointment with cardiologist scheduled.  Encouraged patient to contact provider for sustained elevations of blood pressure to get sooner appointment.  Fasting blood sugar this morning was 98 with fasting ranges of 90-110's recently.  Appointments:  Last attended appointment with primary care provider on 09/04/2019.  Has scheduled appointment with Cardiologist, Dr. Radford Pax on 10/20/19.  Plan: RN Health Coach will make another telephone outreach to patient within the month of April. RN Health Coach provided patient with [place] contact information and encouraged patient to contact her for telephonic counseling services.  [place] [phone] [email or web address]

## 2019-10-20 ENCOUNTER — Other Ambulatory Visit: Payer: Self-pay

## 2019-10-20 ENCOUNTER — Encounter: Payer: Self-pay | Admitting: Cardiology

## 2019-10-20 ENCOUNTER — Ambulatory Visit: Payer: Medicare Other | Admitting: Cardiology

## 2019-10-20 ENCOUNTER — Telehealth: Payer: Self-pay | Admitting: Radiology

## 2019-10-20 VITALS — BP 146/92 | HR 69 | Ht 63.0 in | Wt 199.6 lb

## 2019-10-20 DIAGNOSIS — I4729 Other ventricular tachycardia: Secondary | ICD-10-CM

## 2019-10-20 DIAGNOSIS — I700 Atherosclerosis of aorta: Secondary | ICD-10-CM

## 2019-10-20 DIAGNOSIS — I10 Essential (primary) hypertension: Secondary | ICD-10-CM | POA: Diagnosis not present

## 2019-10-20 DIAGNOSIS — R079 Chest pain, unspecified: Secondary | ICD-10-CM

## 2019-10-20 DIAGNOSIS — E7800 Pure hypercholesterolemia, unspecified: Secondary | ICD-10-CM

## 2019-10-20 DIAGNOSIS — I472 Ventricular tachycardia: Secondary | ICD-10-CM

## 2019-10-20 MED ORDER — HYDRALAZINE HCL 10 MG PO TABS: 10.0000 mg | ORAL_TABLET | Freq: Three times a day (TID) | ORAL | 11 refills | Status: DC

## 2019-10-20 NOTE — Patient Instructions (Addendum)
Medication Instructions:  Your physician has recommended you make the following change in your medication:  1-START hydralazine 10 mg by mouth 3 times a day. *If you need a refill on your cardiac medications before your next appointment, please call your pharmacy*  Lab Work: If you have labs (blood work) drawn today and your tests are completely normal, you will receive your results only by: Marland Kitchen MyChart Message (if you have MyChart) OR . A paper copy in the mail If you have any lab test that is abnormal or we need to change your treatment, we will call you to review the results.  Testing/Procedures: Your physician has recommended that you wear an zio patch monitor. Zio monitors are medical devices that record the heart's electrical activity. Doctors most often [place] these monitors to diagnose arrhythmias. Arrhythmias are problems with the speed or rhythm of the heartbeat. The monitor is a small, portable device. You can wear one while you do your normal daily activities. This is usually used to diagnose what is causing palpitations/syncope (passing out).  Follow-Up: At [facility], you and your health needs are our priority.  As part of our continuing mission to provide you with exceptional heart care, we have created designated Provider Care Teams.  These Care Teams include your primary Cardiologist (physician) and Advanced Practice Providers (APPs -  Physician Assistants and Nurse Practitioners) who all work together to provide you with the care you need, when you need it.  We recommend signing up for the patient portal called "MyChart".  Sign up information is provided on this After Visit Summary.  MyChart is used to connect with patients for Virtual Visits (Telemedicine).  Patients are able to view lab/test results, encounter notes, upcoming appointments, etc.  Non-urgent messages can be sent to your provider as well.   To learn more about what you can do with MyChart, go to  [email or web address].    You have been referred to [facility] in 2 weeks.   Your next appointment:   6 months  The format for your next appointment:   Either In Person or Virtual  Provider:   You may see Fransico Him, MD or one of the following Advanced Practice Providers on your designated Care Team:    Melina Copa, PA-C  Ermalinda Barrios, PA-C
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
## 2019-10-20 NOTE — Telephone Encounter (Signed)
Enrolled patient for a 14 day Zio monitor to be mailed to patients home.  

## 2019-10-27 ENCOUNTER — Other Ambulatory Visit: Payer: Self-pay | Admitting: *Deleted

## 2019-10-27 NOTE — Patient Outreach (Signed)
[facility]) Care Management  10/27/2019  Brooke [name] 1950/08/28 [id number]   RN Health Coach Monthly Outreach  Referral Date: 01/26/2019 Referral Source: EMMI Prevent Screening Reason for Referral: Disease Management Education Insurance: [employer]   Outreach Attempt:  Outreach attempt #1 to patient for follow up.  Patient answered and verified HIPAA.  Stated she was unable to speak today and requested call back another day and time.   Plan:  RN Health Coach will make another outreach attempt within the month of May if no return call back from patient.  [place] [phone] [email or web address]

## 2019-10-27 NOTE — Progress Notes (Signed)
Patient ID: Brooke [name]                 DOB: 04/08/1951                      MRN: [id number]     HPI: Brooke [name] is a 69 y.o. female referred by Dr. Radford Pax to HTN clinic. PMH is significant for PVCs, polymorphic VT, HTN, HLD followed by PCP, DM, syncope, and aortic atherosclerosis. 2D echo 01/2018 showed moderate LVH, EF 60-65%, grade 1 DD. Coronary CT for ongoing CP showed normal coronaries with some scattered calcification in the aorta. At last visit with Dr Radford Pax on 10/20/19, BP was elevated at 146/92. She was started on hydralazine 10mg  TID and presents today for follow up.   Pt presents today in good spirits. She reports tolerating her hydralazine well and started this on April 1st. She does report some dizziness and balance problems at baseline but notes that they have not worsened with med changes. Denies headache or blurred vision. She brings in a a log of home BP readings, readings post-hydralazine start as follows: 128/74, 156/86, 135/75, HR 72-80. She took her AM meds 2.5 hours ago. She does experience stress caring for her husband. Does not drink caffeine, tries to limit her salt intake. Does use high dose ibuprofen - 800mg  BID about 3 days a week for knee or tooth pain. Discussed that NSAIDs can increase her BP and she will try to limit its use and try Tylenol instead.  Current HTN meds: Toprol 100mg  BID, HCTZ 25mg  daily (AM), amlodipine 10mg  daily (AM), hydralazine 10mg  TID Previously tried: ACEi - swelling (denies angioedema), irbesartan, olmesartan - does not recall why these were stopped BP goal: <130/34mmHg  Family History: DM in her mother, heart disease in her father.  Social History: Denies tobacco abuse, alcohol and illicit drug use.  Diet: Yesterday - ate hash browns for breakfast, salad for lunch. Orange soda (decaf) 1x a day. Daughter cooks for her, doesn't add salt to food.  Exercise: Stretching in her bed, no formal diet.  Home BP readings: 128/74, 156/86, 135/75,  HR 72-80  Wt Readings from Last 3 Encounters:  10/20/19 199 lb 9.6 oz (90.5 kg)  03/06/19 168 lb (76.2 kg)  01/27/19 169 lb (76.7 kg)   BP Readings from Last 3 Encounters:  10/20/19 (!) 146/92  03/06/19 (!) 146/83  01/27/19 132/84   Pulse Readings from Last 3 Encounters:  10/20/19 69  03/06/19 62  01/27/19 69    Renal function: CrCl cannot be calculated (Patient's most recent lab result is older than the maximum 21 days allowed.).  Past Medical History:  Diagnosis Date  . Alopecia   . Aortic atherosclerosis ([place])   . Asthma    FOLLOWED BY PCP  . Eczema   . GERD (gastroesophageal reflux disease)   . Gout    04-28-2018--- per pt stable , as been a while since last episode  . History of colon polyps   . History of syncope 2015  . Hyperlipidemia   . Hypertension   . Hypothyroidism   . OA (osteoarthritis) of knee    bilateral  . PVC's (premature ventricular contractions)   . Toxic multinodular goiter    03/ 2003  s/p RAI  . Type 2 diabetes mellitus ([place])    followed by pcp  . Ventricular tachycardia, polymorphic ([place]) 01/04/2014   primary cardiologist-- dr Tressia Miners turner (hx monitor 2015 showed couplet PVCs, as trigger)  .  Wears glasses   . Wears partial dentures    upper    Current Outpatient Medications on File Prior to Visit  Medication Sig Dispense Refill  . albuterol (VENTOLIN HFA) 108 (90 Base) MCG/ACT inhaler Inhale 2 puffs into the lungs every 6 (six) hours as needed for wheezing or shortness of breath. 18 g 11  . allopurinol (ZYLOPRIM) 100 MG tablet TAKE 1 TABLET BY MOUTH EVERY DAY 90 tablet 3  . AMBULATORY NON FORMULARY MEDICATION Medication Name: Rolling walker 1 Device 0  . amLODipine (NORVASC) 10 MG tablet TAKE 1 TABLET BY MOUTH EVERY DAY 90 tablet 3  . citalopram (CELEXA) 20 MG tablet TAKE 1 TABLET BY MOUTH EVERY DAY 90 tablet 3  . clonazePAM (KLONOPIN) 0.5 MG tablet Take 1 tablet (0.5 mg total) by mouth 2 (two) times daily as needed (vertigo). 30  tablet 2  . fexofenadine (ALLEGRA) 180 MG tablet Take 1 tablet (180 mg total) by mouth daily. 30 tablet 11  . Fluticasone-Salmeterol (WIXELA INHUB) 250-50 MCG/DOSE AEPB Inhale 1 puff into the lungs 2 (two) times daily. 60 each 11  . gabapentin (NEURONTIN) 100 MG capsule Take 1 capsule (100 mg total) by mouth at bedtime. 90 capsule 1  . hydrALAZINE (APRESOLINE) 10 MG tablet Take 1 tablet (10 mg total) by mouth 3 (three) times daily. 90 tablet 11  . hydrochlorothiazide (HYDRODIURIL) 25 MG tablet TAKE 1 TABLET BY MOUTH EVERY DAY 90 tablet 3  . ibuprofen (ADVIL) 800 MG tablet TAKE 1 TABLET BY MOUTH TWICE A DAY AS NEEDED FOR PAIN 60 tablet 2  . meclizine (ANTIVERT) 25 MG tablet TAKE 1 TABLET BY MOUTH 3 TIMES DAILY AS NEEDED FOR DIZZINESS. 30 tablet 5  . metFORMIN (GLUCOPHAGE-XR) 500 MG 24 hr tablet TAKE ONE TABLET BY MOUTH ONCE DAILY WITH BREAKFAST 90 tablet 3  . metoprolol succinate (TOPROL-XL) 100 MG 24 hr tablet Take 1 tablet (100 mg total) by mouth 2 (two) times daily. 180 tablet 3  . omeprazole (PRILOSEC) 20 MG capsule TAKE 1 CAPSULE BY MOUTH TWICE A DAY 180 capsule 3  . potassium chloride SA (KLOR-CON M20) 20 MEQ tablet Take 1 tablet (20 mEq total) by mouth 2 (two) times daily. 180 tablet 1  . rosuvastatin (CRESTOR) 20 MG tablet TAKE 1 TABLET BY MOUTH EVERY DAY 90 tablet 1  . triamcinolone (NASACORT) 55 MCG/ACT AERO nasal inhaler Place 2 sprays into the nose daily. 1 Inhaler 12  . zolpidem (AMBIEN) 10 MG tablet TAKE 1 TABLET BY MOUTH AT BEDTIME AS NEEDED FOR SLEEP. 90 tablet 1   No current facility-administered medications on file prior to visit.    Allergies  Allergen Reactions  . Ace Inhibitors Swelling       . Codeine Nausea And Vomiting and Rash  . Hydrocodone Nausea And Vomiting  . Tramadol Nausea And Vomiting  . Tylenol [Acetaminophen] Itching and Rash     Assessment/Plan:  1. Hypertension - BP very close to goal <130/68mmHg. Since pt has only been on hydralazine for the past  week, will continue current BP meds including Toprol 100mg  BID, HCTZ 25mg  daily, amlodipine 10mg  daily, and hydralazine 10mg  TID. Encouraged pt to start walking for exercise. She will monitor her BP at home and I'll call in 2 weeks to see if BP have improved to goal consistently. If not, can increase hydralazine dose.  Megan E. Supple, PharmD, BCACP, [place] [id number], [place], [place] [address] Phone: [phone]; Fax: [phone] 10/28/2019  10:49 AM

## 2019-10-28 ENCOUNTER — Ambulatory Visit (INDEPENDENT_AMBULATORY_CARE_PROVIDER_SITE_OTHER): Payer: Medicare Other | Admitting: Pharmacist

## 2019-10-28 ENCOUNTER — Other Ambulatory Visit: Payer: Self-pay

## 2019-10-28 VITALS — BP 132/64 | HR 81

## 2019-10-28 DIAGNOSIS — I10 Essential (primary) hypertension: Secondary | ICD-10-CM | POA: Diagnosis not present

## 2019-10-28 NOTE — Patient Instructions (Addendum)
It was nice to meet you today!  Your blood pressure goal is less than 130/64mmHg  Take your blood pressure medications as follows: Metoprolol succinate 100mg  - 1 tablet twice daily HCTZ 25mg  - 1 tablet once daily Amlodipine 10mg  - 1 tablet once daily Hydralazine 10mg  - 1 tablet 3 times a day  Limit salt in your diet to less than 2,000mg   Limit caffeine intake to less than 2 cups per day  Try to start walking to stay active  I'll give you a call in 2 weeks to see how your readings are looking

## 2019-11-15 ENCOUNTER — Other Ambulatory Visit: Payer: Self-pay | Admitting: Internal Medicine

## 2019-11-23 ENCOUNTER — Other Ambulatory Visit: Payer: Self-pay | Admitting: Internal Medicine

## 2019-11-23 IMAGING — MR MRI HEAD WITHOUT CONTRAST
10 series · 48 of 48 positions shown · non-contrast
Comparison: 12/27/2016 and 01/12/2014 MRI brain.

CLINICAL DATA: Pain on top of head for 4 months.

EXAM:
MRI HEAD WITHOUT CONTRAST
TECHNIQUE: Multiplanar, multiecho pulse sequences of the brain and surrounding
structures were obtained without intravenous contrast.

[Series 2: t1_se_sag · sagittal · 5.0mm · 0.45mm/px · 3 of 21 slices shown]
[im 1/21]
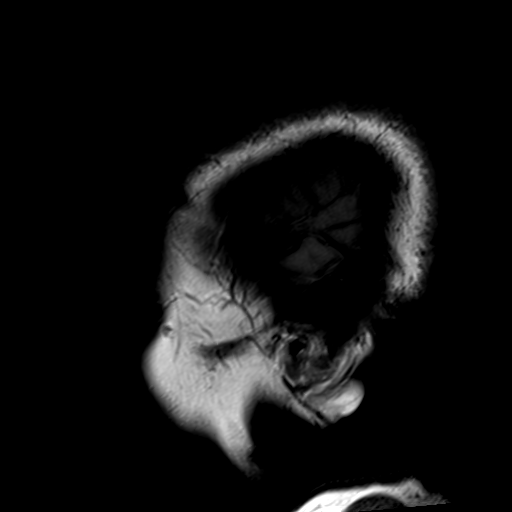
[im 11/21]
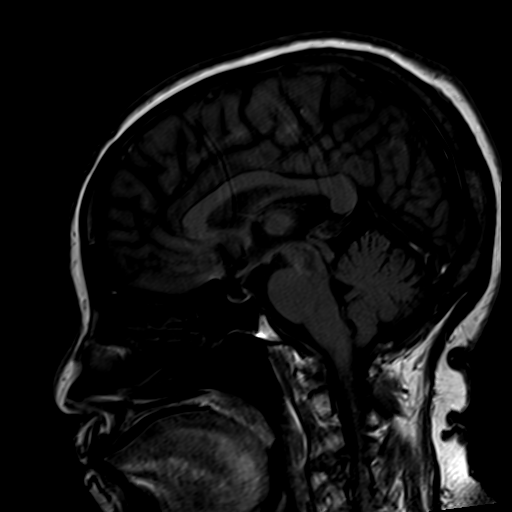
[im 21/21]
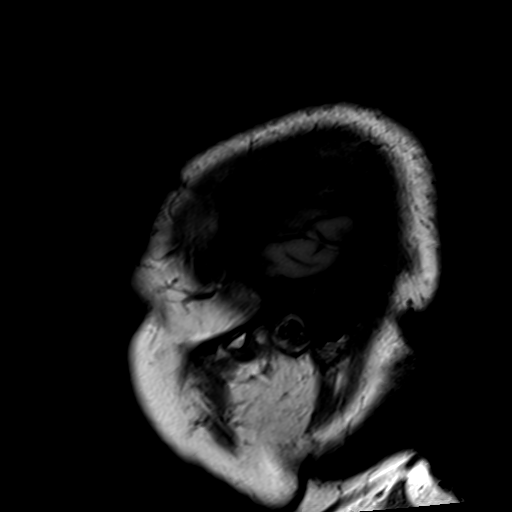

[Series 3: ep2d_diff_3 · axial · 3.0mm · 1.80mm/px · z∈[-49,+88]mm · 8 of 93 slices shown]
[im 1/93]
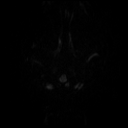
[im 14/93]
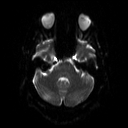
[im 27/93]
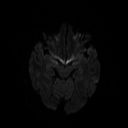
[im 40/93]
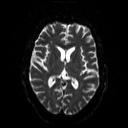
[im 53/93]
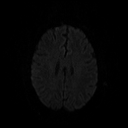
[im 66/93]
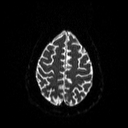
[im 79/93]
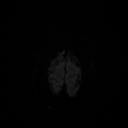
[im 93/93]
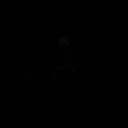

[Series 4: ep2d_diff_3_adc · axial · 3.0mm · 1.80mm/px · z∈[-52,+88]mm · 4 of 48 slices shown]
[im 1/48]
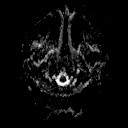
[im 16/48]
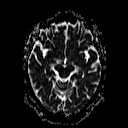
[im 32/48]
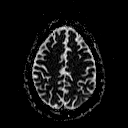
[im 48/48]
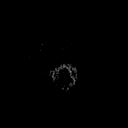

[Series 5: ep2d_diff_cor · coronal · 5.0mm · 1.77mm/px · 4 of 48 slices shown]
[im 1/48]
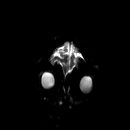
[im 16/48]
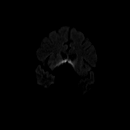
[im 32/48]
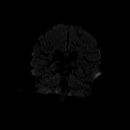
[im 48/48]
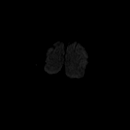

[Series 6: ep2d_diff_cor_adc · coronal · 5.0mm · 1.77mm/px · 2 of 24 slices shown]
[im 1/24]
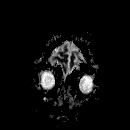
[im 24/24]
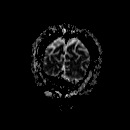

[Series 8: swi_images · axial · 2.0mm · 0.90mm/px · z∈[-62,+95]mm · 7 of 80 slices shown]
[im 1/80]
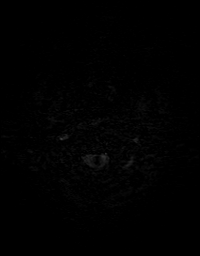
[im 14/80]
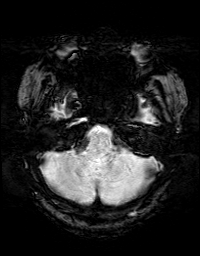
[im 27/80]
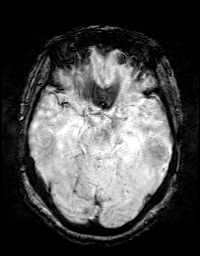
[im 40/80]
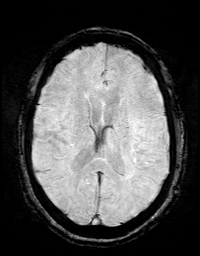
[im 53/80]
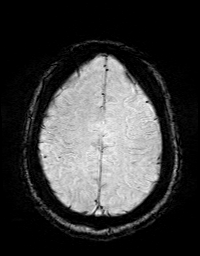
[im 66/80]
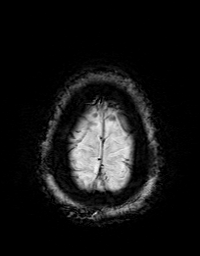
[im 80/80]
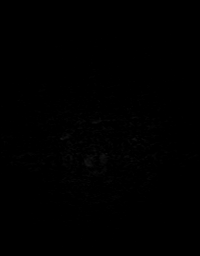

[Series 9: FLAIR · axial · 3.0mm · 0.43mm/px · z∈[-60,+96]mm · 2 of 27 slices shown]
[im 1/27]
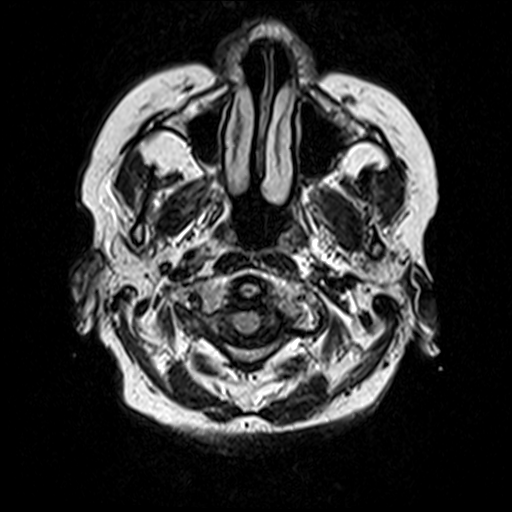
[im 27/27]
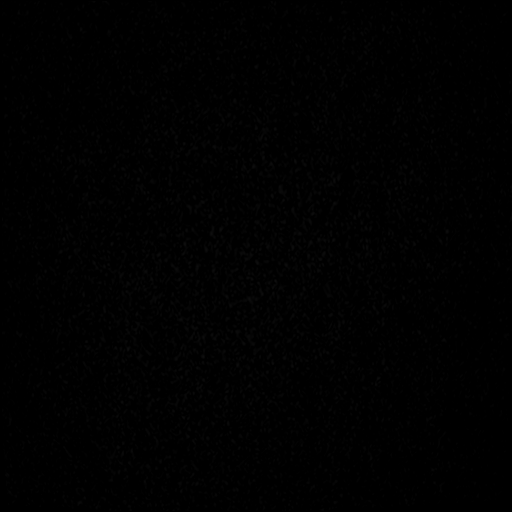

[Series 10: t2_tse_tra_512 · axial · 5.0mm · 0.60mm/px · z∈[-52,+85]mm · 2 of 24 slices shown]
[im 1/24]
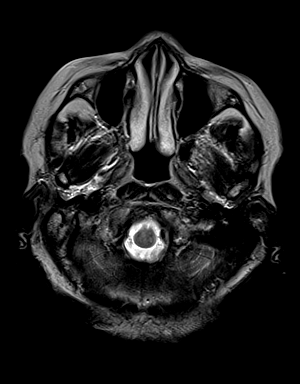
[im 24/24]
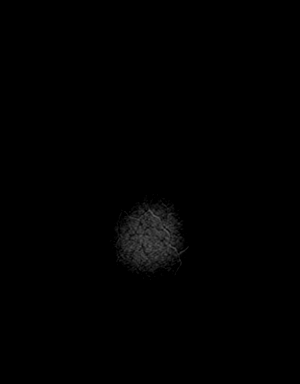

[Series 11: t1_mpr_tra · axial · 1.0mm · 0.72mm/px · z∈[-63,+96]mm · 14 of 160 slices shown]
[im 1/160]
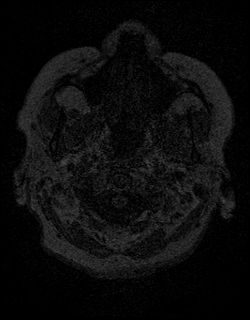
[im 13/160]
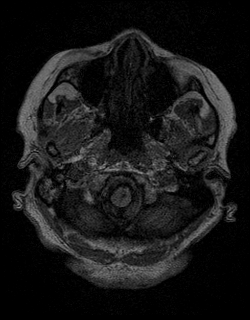
[im 25/160]
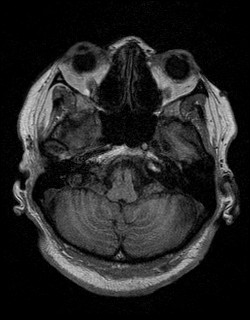
[im 37/160]
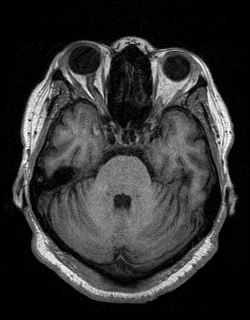
[im 49/160]
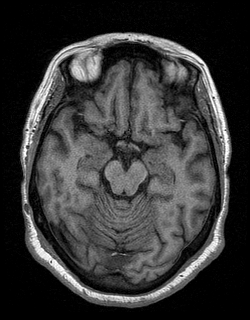
[im 62/160]
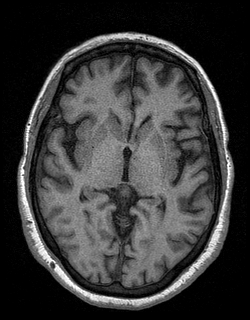
[im 74/160]
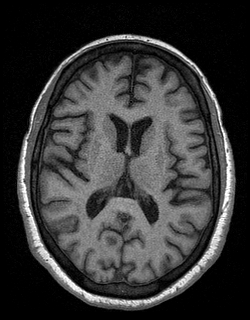
[im 86/160]
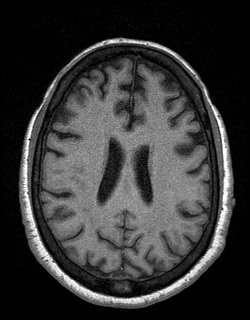
[im 98/160]
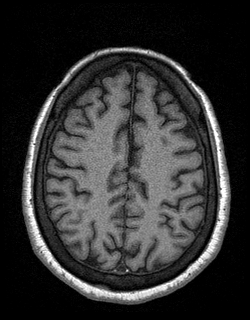
[im 111/160]
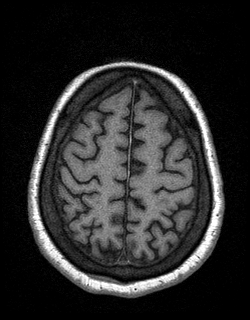
[im 123/160]
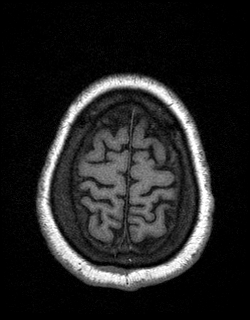
[im 135/160]
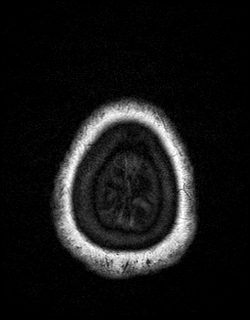
[im 147/160]
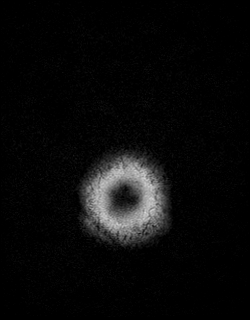
[im 160/160]
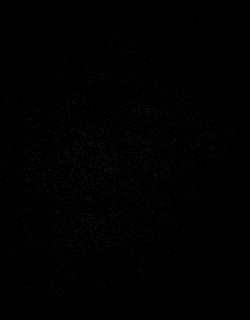

[Series 12: T2 · coronal · 5.0mm · 0.45mm/px · 2 of 26 slices shown]
[im 1/26]
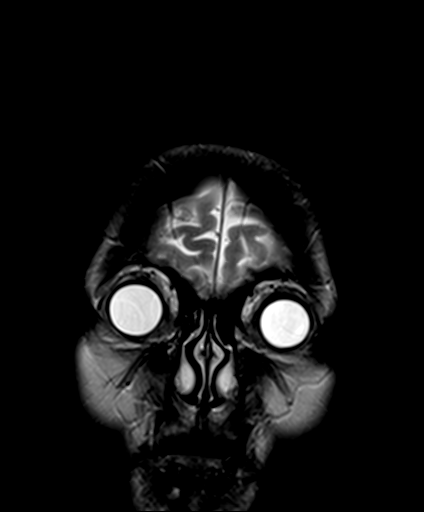
[im 26/26]
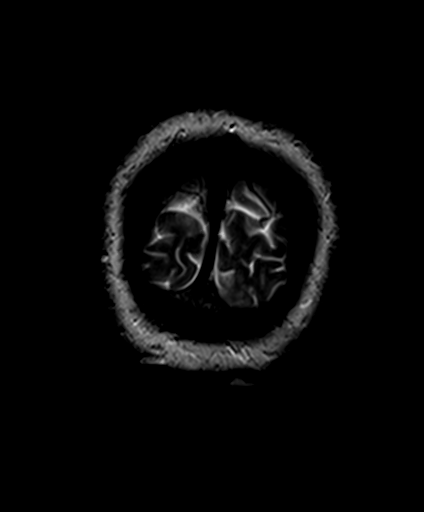

[48 of 48 positions shown; findings below may reference images not displayed]

FINDINGS: Brain: No acute infarction, hemorrhage, hydrocephalus, extra-axial
collection or mass lesion. Mild cerebral and cerebellar atrophy with
prominence of the ventricles, cisterns, and sulci.

Mild T2 and FLAIR hyperintensities affect periventricular and
subcortical white matter, with much less prominent T2 hyperintensity
of the RIGHT lateral pons. Correlating with prior studies, there is
continued involution of the brainstem abnormality since 9235, and
this region is essentially normal on today's exam.

Vascular: Flow voids are maintained.

Skull and upper cervical spine: Partial empty sella, with flattening
of the gland noted previously, stable. Normal skull base and upper
cervical marrow signal. Cervical spondylosis, with reversal of the
normal cervical lordotic curvature, C3-4 and C4-5 disc space
narrowing, possible spinal stenosis at the C3-4 level.

Sinuses/Orbits: Paranasal sinuses are clear. RIGHT maxillary sinus
is small.

Other: No nasopharyngeal pathology or mastoid fluid. Scalp and other
visualized extracranial soft tissues grossly unremarkable.
IMPRESSION: Subcortical and periventricular white matter signal abnormality are
stable, but there has been continued regression since 9235 and 2134
of the previous brainstem T2 and FLAIR hyperintensity on the RIGHT.
Previous note states no clinical features of MS, and the observed
abnormalities are favored to represent sequelae of chronic
microvascular ischemic change.

No acute intracranial findings. No cause is seen for vertex
headache.

Partial empty sella, with flattening of the gland, stable.

Incidental cervical spondylosis, also similar to priors.

## 2019-11-26 ENCOUNTER — Encounter: Payer: Self-pay | Admitting: *Deleted

## 2019-11-26 ENCOUNTER — Other Ambulatory Visit: Payer: Self-pay | Admitting: *Deleted

## 2019-11-26 NOTE — Patient Outreach (Signed)
[facility]) Care Management  [place]  11/26/2019   Brooke [name] July 27, 1950 [id number]    [place] Monthly Outreach   Referral Date:  01/26/2019 Referral Source:  EMMI Prevent Screening Reason for Referral:  Disease Management Education Insurance:  [employer]   Outreach Attempt:  Successful telephone outreach to patient for follow up.  HIPAA verified with patient.  Patient reporting she is doing well.  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  Blood pressure has been better controlled with start of Hydralazine.  Blood pressure this morning was 116/69.  Does admit that she frequently forgets to take afternoon dose of hydralazine.  Encouraged patient to discussed missing dose of medication with HTN Clinic when they call for log of readings soon.  Denies any increase in light headiness or dizziness or falls.  Does report using her rescue inhaler about twice a day but states her shortness of breath is more related to her increased activity when caring for her husband or when she gets upset and aggravated.  Continues to monitor blood sugars.  Fasting blood sugar this morning was 109 with recent fasting ranges of 80-100's).  Encounter Medications:  Outpatient Encounter Medications as of 11/26/2019  Medication Sig  . albuterol (VENTOLIN HFA) 108 (90 Base) MCG/ACT inhaler Inhale 2 puffs into the lungs every 6 (six) hours as needed for wheezing or shortness of breath.  . allopurinol (ZYLOPRIM) 100 MG tablet TAKE 1 TABLET BY MOUTH EVERY DAY  . amLODipine (NORVASC) 10 MG tablet TAKE 1 TABLET BY MOUTH EVERY DAY  . citalopram (CELEXA) 20 MG tablet TAKE 1 TABLET BY MOUTH EVERY DAY  . clonazePAM (KLONOPIN) 0.5 MG tablet Take 1 tablet (0.5 mg total) by mouth 2 (two) times daily as needed (vertigo).  . fexofenadine (ALLEGRA) 180 MG tablet Take 1  tablet (180 mg total) by mouth daily.  . Fluticasone-Salmeterol (WIXELA INHUB) 250-50 MCG/DOSE AEPB Inhale 1 puff into the lungs 2 (two) times daily.  Marland Kitchen gabapentin (NEURONTIN) 100 MG capsule Take 1 capsule (100 mg total) by mouth at bedtime.  . hydrALAZINE (APRESOLINE) 10 MG tablet Take 1 tablet (10 mg total) by mouth 3 (three) times daily.  . hydrochlorothiazide (HYDRODIURIL) 25 MG tablet TAKE 1 TABLET BY MOUTH EVERY DAY  . ibuprofen (ADVIL) 800 MG tablet TAKE 1 TABLET BY MOUTH TWICE A DAY AS NEEDED FOR PAIN  . metFORMIN (GLUCOPHAGE-XR) 500 MG 24 hr tablet TAKE ONE TABLET BY MOUTH ONCE DAILY WITH BREAKFAST  . metoprolol succinate (TOPROL-XL) 100 MG 24 hr tablet Take 1 tablet (100 mg total) by mouth 2 (two) times daily.  Marland Kitchen omeprazole (PRILOSEC) 20 MG capsule TAKE 1 CAPSULE BY MOUTH TWICE A DAY  . potassium chloride SA (KLOR-CON M20) 20 MEQ tablet Take 1 tablet (20 mEq total) by mouth 2 (two) times daily.  . rosuvastatin (CRESTOR) 20 MG tablet Take 1 tablet (20 mg total) by mouth daily. Annual appt due in July must see provider for future refills  . triamcinolone (NASACORT) 55 MCG/ACT AERO nasal inhaler Place 2 sprays into the nose daily.  Marland Kitchen zolpidem (AMBIEN) 10 MG tablet TAKE 1 TABLET BY MOUTH AT BEDTIME AS NEEDED FOR SLEEP.  Marland Kitchen AMBULATORY NON FORMULARY MEDICATION Medication Name: Rolling walker  . meclizine (ANTIVERT) 25 MG tablet TAKE 1 TABLET BY MOUTH 3 TIMES DAILY AS NEEDED FOR DIZZINESS.  No facility-administered encounter medications on file as of 11/26/2019.    Functional Status:  In your present state of health, do you have any difficulty performing the following activities: 11/26/2019 09/11/2019  Hearing? N N  Vision? Y N  Comment trouble seeing out of left eye -  Difficulty concentrating or making decisions? Y N  Walking or climbing stairs? N N  Dressing or bathing? N N  Doing errands, shopping? N N  Preparing Food and eating ? N N  Using the Toilet? N N  In the past six months,  have you accidently leaked urine? N N  Do you have problems with loss of bowel control? N N  Managing your Medications? N N  Managing your Finances? N N  Housekeeping or managing your Housekeeping? N N  Some recent data might be hidden    Fall/Depression Screening: Fall Risk  11/26/2019 10/07/2019 09/11/2019  Falls in the past year? 0 0 0  Number falls in past yr: 0 0 0  Injury with Fall? 0 0 0  Risk Factor Category  - - -  Risk for fall due to : Medication side effect;Impaired mobility;Impaired balance/gait - Impaired balance/gait;Impaired mobility;Medication side effect  Follow up Falls evaluation completed;Education provided;Falls prevention discussed - Education provided;Falls prevention discussed   PHQ 2/9 Scores 09/11/2019 09/11/2019 04/30/2019 02/18/2019 01/27/2019 01/26/2019 04/08/2018  PHQ - 2 Score _0 0 0 0 3  PHQ- 9 Score _1 - - - 9   THN CM Care Plan Problem One     Most Recent Value  Care Plan Problem One  Knowledge deficiet related to Diabetes and self care management of depression  Role Documenting the Problem One  [place] for Problem One  Active  THN Long Term Goal   Patient will maintain Hgb A1C of 6 or below within the next 6 months.  THN Long Term Goal Start Date  09/11/19  Interventions for Problem One Long Term Goal  Congratulated patient on better blood pressure control and depression control, care plans and goals reviewed and discussed, reviewed medications and indications and encouraged medication compliance, encouraged patient to schedule follow up appointment with primary care provider, encouraged patien tto discuss blood pressure readings with HTN Clinic and the fact she frequently misses afternonn dose of hydralazine, encouraged diabetic low salt diet with healthier food options, encouraged to continue to monitor blood sugars and notify provider for sustained elevations  THN CM Short Term Goal #1   Patient will report scheduling eye examination  within the next 30 days.  THN CM Short Term Goal #1 Start Date  11/26/19  [facility] CM Short Term Goal #1 Met Date  11/26/19  Interventions for Short Term Goal #1  Discussed importance of yearly eye exams for diabetics, encouraged patient to schedule eye appointment, confirmed patient has eye provider to schedule appointment with  [facility] CM Short Term Goal #2   Patient will report less use of rescue inhaler in the next 30 days.  THN CM Short Term Goal #2 Start Date  10/07/19     Appointments:  Last attended appointment with primary care provider on 09/04/2019.  States she plans to set up follow up appointment soon.  Has not seen eye provider since 2019.  Encouraged patient to schedule eye exam as soon as possible.  Attended appointment with HTN clinic on 10/28/2019.  Plan: RN Health Coach will send primary care provider quarterly update. RN Health Coach will make next telephone outreach  to patient within the month of July and patient agrees to future outreach.  [place] [phone] [email or web address]

## 2019-12-01 ENCOUNTER — Encounter: Payer: Self-pay | Admitting: Internal Medicine

## 2019-12-01 ENCOUNTER — Encounter: Payer: Self-pay | Admitting: Gastroenterology

## 2019-12-01 ENCOUNTER — Ambulatory Visit (INDEPENDENT_AMBULATORY_CARE_PROVIDER_SITE_OTHER): Payer: Medicare Other | Admitting: Internal Medicine

## 2019-12-01 ENCOUNTER — Other Ambulatory Visit: Payer: Self-pay | Admitting: Internal Medicine

## 2019-12-01 ENCOUNTER — Other Ambulatory Visit: Payer: Self-pay

## 2019-12-01 VITALS — BP 170/102 | HR 74 | Temp 98.3°F | Ht 63.0 in | Wt 190.0 lb

## 2019-12-01 DIAGNOSIS — Z0000 Encounter for general adult medical examination without abnormal findings: Secondary | ICD-10-CM

## 2019-12-01 DIAGNOSIS — Z0001 Encounter for general adult medical examination with abnormal findings: Secondary | ICD-10-CM | POA: Diagnosis not present

## 2019-12-01 DIAGNOSIS — E559 Vitamin D deficiency, unspecified: Secondary | ICD-10-CM

## 2019-12-01 DIAGNOSIS — F418 Other specified anxiety disorders: Secondary | ICD-10-CM

## 2019-12-01 DIAGNOSIS — E119 Type 2 diabetes mellitus without complications: Secondary | ICD-10-CM

## 2019-12-01 LAB — TSH: TSH: 2.74 u[IU]/mL (ref 0.35–4.50)

## 2019-12-01 LAB — URINALYSIS, ROUTINE W REFLEX MICROSCOPIC
Bilirubin Urine: NEGATIVE
Hgb urine dipstick: NEGATIVE
Ketones, ur: NEGATIVE
Nitrite: NEGATIVE
Specific Gravity, Urine: 1.02 (ref 1.000–1.030)
Urine Glucose: NEGATIVE
Urobilinogen, UA: 0.2 (ref 0.0–1.0)
pH: 6.5 (ref 5.0–8.0)

## 2019-12-01 LAB — VITAMIN D 25 HYDROXY (VIT D DEFICIENCY, FRACTURES): VITD: 63.66 ng/mL (ref 30.00–100.00)

## 2019-12-01 LAB — BASIC METABOLIC PANEL
BUN: 23 mg/dL (ref 6–23)
CO2: 25 mEq/L (ref 19–32)
Calcium: 9.9 mg/dL (ref 8.4–10.5)
Chloride: 107 mEq/L (ref 96–112)
Creatinine, Ser: 1.39 mg/dL — ABNORMAL HIGH (ref 0.40–1.20)
GFR: 45.64 mL/min — ABNORMAL LOW (ref >60.00)
Glucose, Bld: 104 mg/dL — ABNORMAL HIGH (ref 70–99)
Potassium: 3.7 mEq/L (ref 3.5–5.1)
Sodium: 140 mEq/L (ref 135–145)

## 2019-12-01 LAB — CBC WITH DIFFERENTIAL/PLATELET
Basophils Absolute: 0 10*3/uL (ref 0.0–0.1)
Basophils Relative: 0.6 % (ref 0.0–3.0)
Eosinophils Absolute: 0.1 10*3/uL (ref 0.0–0.7)
Eosinophils Relative: 1.1 % (ref 0.0–5.0)
HCT: 34 % — ABNORMAL LOW (ref 36.0–46.0)
Hemoglobin: 11.2 g/dL — ABNORMAL LOW (ref 12.0–15.0)
Lymphocytes Relative: 13.9 % (ref 12.0–46.0)
Lymphs Abs: 1.1 10*3/uL (ref 0.7–4.0)
MCHC: 32.9 g/dL (ref 30.0–36.0)
MCV: 84.8 fl (ref 78.0–100.0)
Monocytes Absolute: 1 10*3/uL (ref 0.1–1.0)
Monocytes Relative: 12.8 % — ABNORMAL HIGH (ref 3.0–12.0)
Neutro Abs: 5.7 10*3/uL (ref 1.4–7.7)
Neutrophils Relative %: 71.6 % (ref 43.0–77.0)
Platelets: 227 10*3/uL (ref 150.0–400.0)
RBC: 4.01 Mil/uL (ref 3.87–5.11)
RDW: 14.8 % (ref 11.5–15.5)
WBC: 8 10*3/uL (ref 4.0–10.5)

## 2019-12-01 LAB — HEPATIC FUNCTION PANEL
ALT: 9 U/L (ref 0–35)
AST: 17 U/L (ref 0–37)
Albumin: 4.1 g/dL (ref 3.5–5.2)
Alkaline Phosphatase: 70 U/L (ref 39–117)
Bilirubin, Direct: 0.1 mg/dL (ref 0.0–0.3)
Total Bilirubin: 0.2 mg/dL (ref 0.2–1.2)
Total Protein: 7.3 g/dL (ref 6.0–8.3)

## 2019-12-01 LAB — MICROALBUMIN / CREATININE URINE RATIO
Creatinine,U: 156.6 mg/dL
Microalb Creat Ratio: 1.9 mg/g (ref 0.0–30.0)
Microalb, Ur: 2.9 mg/dL — ABNORMAL HIGH (ref 0.0–1.9)

## 2019-12-01 LAB — HEMOGLOBIN A1C: Hgb A1c MFr Bld: 5.9 % (ref 4.6–6.5)

## 2019-12-01 LAB — LIPID PANEL
Cholesterol: 125 mg/dL (ref 0–200)
HDL: 48.4 mg/dL (ref >39.00)
LDL Cholesterol: 61 mg/dL (ref 0–99)
NonHDL: 76.43
Total CHOL/HDL Ratio: 3
Triglycerides: 78 mg/dL (ref 0.0–149.0)
VLDL: 15.6 mg/dL (ref 0.0–40.0)

## 2019-12-01 MED ORDER — CIPROFLOXACIN HCL 500 MG PO TABS
500.0000 mg | ORAL_TABLET | Freq: Two times a day (BID) | ORAL | 0 refills | Status: AC
Start: 2019-12-01 — End: 2019-12-11

## 2019-12-01 MED ORDER — CITALOPRAM HYDROBROMIDE 40 MG PO TABS: 40.0000 mg | ORAL_TABLET | Freq: Every day | ORAL | 3 refills | Status: DC

## 2019-12-01 NOTE — Assessment & Plan Note (Signed)
For oral replacement 

## 2019-12-01 NOTE — Progress Notes (Signed)
Subjective:    Patient ID: Brooke [name], female    DOB: 13-Jun-1951, 69 y.o.   MRN: [id number]  HPI  Here for wellness and f/u;  Overall doing ok;  Pt denies Chest pain, worsening SOB, DOE, wheezing, orthopnea, PND, worsening LE edema, palpitations, dizziness or syncope.  Pt denies neurological change such as new headache, facial or extremity weakness.  Pt denies polydipsia, polyuria, or low sugar symptoms. Pt states overall good compliance with treatment and medications, good tolerability, and has been trying to follow appropriate diet.  Pt has had mild worsening depressive symptoms, but no suicidal ideation or panic. No fever, night sweats, wt loss, loss of appetite, or other constitutional symptoms.  Pt states good ability with ADL's, has low fall risk, home safety reviewed and adequate, no other significant changes in hearing or vision, and only occasionally active with exercise. Has eye doctor appt on friday Past Medical History:  Diagnosis Date  . Alopecia   . Aortic atherosclerosis ([place])   . Asthma    FOLLOWED BY PCP  . Eczema   . GERD (gastroesophageal reflux disease)   . Gout    04-28-2018--- per pt stable , as been a while since last episode  . History of colon polyps   . History of syncope 2015  . Hyperlipidemia   . Hypertension   . Hypothyroidism   . OA (osteoarthritis) of knee    bilateral  . PVC's (premature ventricular contractions)   . Toxic multinodular goiter    03/ 2003  s/p RAI  . Type 2 diabetes mellitus ([place])    followed by pcp  . Ventricular tachycardia, polymorphic ([place]) 01/04/2014   primary cardiologist-- dr Tressia Miners turner (hx monitor 2015 showed couplet PVCs, as trigger)  . Wears glasses   . Wears partial dentures    upper   Past Surgical History:  Procedure Laterality Date  . ABDOMINAL HYSTERECTOMY  10/22/1999   WITH BSO  . CATARACT EXTRACTION W/ INTRAOCULAR LENS IMPLANT Left YRS AGO  . EXCISION ABDOMINAL WALL MASS  12-20-2005   dr Margot Chimes @[facility]    neruofibroma  . LAPAROSCOPIC CHOLECYSTECTOMY  10/01/2002   dr Margot Chimes @[facility]   . LEFT HEART CATHETERIZATION WITH CORONARY ANGIOGRAM N/A 01/07/2014   Procedure: LEFT HEART CATHETERIZATION WITH CORONARY ANGIOGRAM;  Surgeon: Peter M Martinique, MD;  Location: [facility] CATH LAB;  Service: Cardiovascular;  Laterality: N/A;  . RASTELLI PROCEDURE  6/98 neg  . RENAL ARTERY STENT Left 11/2003   angioplasty and stenting  . RENAL ARTERY STENT Left 02/2005   re-stenting  . TOTAL KNEE ARTHROPLASTY Right 05/05/2018   Procedure: RIGHT TOTAL KNEE ARTHROPLASTY;  Surgeon: Frederik Pear, MD;  Location: [facility] ORS;  Service: Orthopedics;  Laterality: Right;  . TOTAL KNEE ARTHROPLASTY Left 09/30/2018   Procedure: TOTAL KNEE ARTHROPLASTY;  Surgeon: Melrose Nakayama, MD;  Location: [facility] ORS;  Service: Orthopedics;  Laterality: Left;    reports that she has never smoked. She has never used smokeless tobacco. She reports that she does not drink alcohol or use drugs. family history includes Diabetes in her mother; Heart disease in her father. Allergies  Allergen Reactions  . Ace Inhibitors Swelling       . Codeine Nausea And Vomiting and Rash  . Hydrocodone Nausea And Vomiting  . Tramadol Nausea And Vomiting  . Tylenol [Acetaminophen] Itching and Rash   Current Outpatient Medications on File Prior to Visit  Medication Sig Dispense Refill  . albuterol (VENTOLIN HFA) 108 (90 Base) MCG/ACT inhaler Inhale 2 puffs  into the lungs every 6 (six) hours as needed for wheezing or shortness of breath. 18 g 11  . allopurinol (ZYLOPRIM) 100 MG tablet TAKE 1 TABLET BY MOUTH EVERY DAY 90 tablet 3  . AMBULATORY NON FORMULARY MEDICATION Medication Name: Rolling walker 1 Device 0  . amLODipine (NORVASC) 10 MG tablet TAKE 1 TABLET BY MOUTH EVERY DAY 90 tablet 3  . clonazePAM (KLONOPIN) 0.5 MG tablet Take 1 tablet (0.5 mg total) by mouth 2 (two) times daily as needed (vertigo). 30 tablet 2  . fexofenadine (ALLEGRA) 180 MG tablet Take 1 tablet (180 mg  total) by mouth daily. 30 tablet 11  . Fluticasone-Salmeterol (WIXELA INHUB) 250-50 MCG/DOSE AEPB Inhale 1 puff into the lungs 2 (two) times daily. 60 each 11  . gabapentin (NEURONTIN) 100 MG capsule Take 1 capsule (100 mg total) by mouth at bedtime. 90 capsule 1  . hydrALAZINE (APRESOLINE) 10 MG tablet Take 1 tablet (10 mg total) by mouth 3 (three) times daily. 90 tablet 11  . hydrochlorothiazide (HYDRODIURIL) 25 MG tablet TAKE 1 TABLET BY MOUTH EVERY DAY 90 tablet 3  . ibuprofen (ADVIL) 800 MG tablet TAKE 1 TABLET BY MOUTH TWICE A DAY AS NEEDED FOR PAIN 60 tablet 2  . meclizine (ANTIVERT) 25 MG tablet TAKE 1 TABLET BY MOUTH 3 TIMES DAILY AS NEEDED FOR DIZZINESS. 30 tablet 5  . metFORMIN (GLUCOPHAGE-XR) 500 MG 24 hr tablet TAKE ONE TABLET BY MOUTH ONCE DAILY WITH BREAKFAST 90 tablet 3  . metoprolol succinate (TOPROL-XL) 100 MG 24 hr tablet Take 1 tablet (100 mg total) by mouth 2 (two) times daily. 180 tablet 3  . omeprazole (PRILOSEC) 20 MG capsule TAKE 1 CAPSULE BY MOUTH TWICE A DAY 180 capsule 3  . potassium chloride SA (KLOR-CON M20) 20 MEQ tablet Take 1 tablet (20 mEq total) by mouth 2 (two) times daily. 180 tablet 1  . rosuvastatin (CRESTOR) 20 MG tablet Take 1 tablet (20 mg total) by mouth daily. Annual appt due in July must see provider for future refills 90 tablet 0  . triamcinolone (NASACORT) 55 MCG/ACT AERO nasal inhaler Place 2 sprays into the nose daily. 1 Inhaler 12  . zolpidem (AMBIEN) 10 MG tablet TAKE 1 TABLET BY MOUTH AT BEDTIME AS NEEDED FOR SLEEP. 90 tablet 1   No current facility-administered medications on file prior to visit.   Review of Systems All otherwise neg per pt     Objective:   Physical Exam BP (!) 170/102 (BP Location: Left Arm, Patient Position: Sitting, Cuff Size: Large)   Pulse 74   Temp 98.3 F (36.8 C) (Oral)   Ht 5\' 3"  (1.6 m)   Wt 190 lb (86.2 kg)   SpO2 98%   BMI 33.66 kg/m  VS noted,  Constitutional: Pt appears in NAD HENT: Head: NCAT.    Right Ear: External ear normal.  Left Ear: External ear normal.  Eyes: . Pupils are equal, round, and reactive to light. Conjunctivae and EOM are normal Nose: without d/c or deformity Neck: Neck supple. Gross normal ROM Cardiovascular: Normal rate and regular rhythm.   Pulmonary/Chest: Effort normal and breath sounds without rales or wheezing.  Abd:  Soft, NT, ND, + BS, no organomegaly Neurological: Pt is alert. At baseline orientation, motor grossly intact Skin: Skin is warm. No rashes, other new lesions, no LE edema Psychiatric: Pt behavior is normal without agitation  All otherwise neg per pt Lab Results  Component Value Date   WBC 8.0 12/01/2019  HGB 11.2 (L) 12/01/2019   HCT 34.0 (L) 12/01/2019   PLT 227.0 12/01/2019   GLUCOSE 104 (H) 12/01/2019   CHOL 125 12/01/2019   TRIG 78.0 12/01/2019   HDL 48.40 12/01/2019   LDLDIRECT 139.1 09/05/2009   LDLCALC 61 12/01/2019   ALT 9 12/01/2019   AST 17 12/01/2019   NA 140 12/01/2019   K 3.7 12/01/2019   CL 107 12/01/2019   CREATININE 1.39 (H) 12/01/2019   BUN 23 12/01/2019   CO2 25 12/01/2019   TSH 2.74 12/01/2019   INR 1.0 09/24/2018   HGBA1C 5.9 12/01/2019   MICROALBUR 2.9 (H) 12/01/2019      Assessment & Plan:

## 2019-12-01 NOTE — Assessment & Plan Note (Signed)
Ok for increased celexa 40 qd

## 2019-12-01 NOTE — Assessment & Plan Note (Signed)
stable overall by history and exam, recent data reviewed with pt, and pt to continue medical treatment as before,  to f/u any worsening symptoms or concerns  

## 2019-12-01 NOTE — Patient Instructions (Signed)
OK to increase the Citalopram (celexa) to 40 mg per day  Please continue all other medications as before, and refills have been done if requested.  Please have the pharmacy call with any other refills you may need.  Please continue your efforts at being more active, low cholesterol diet, and weight control.  You are otherwise up to date with prevention measures today.  Please keep your appointments with your specialists as you may have planned  Please go to the LAB at the blood drawing area for the tests to be done  You will be contacted by phone if any changes need to be made immediately.  Otherwise, you will receive a letter about your results with an explanation, but please check with MyChart first.  Please remember to sign up for MyChart if you have not done so, as this will be important to you in the future with finding out test results, communicating by private email, and scheduling acute appointments online when needed.  Please make an Appointment to return in 6 months, or sooner if needed

## 2019-12-01 NOTE — Assessment & Plan Note (Signed)

## 2019-12-04 DIAGNOSIS — E119 Type 2 diabetes mellitus without complications: Secondary | ICD-10-CM | POA: Diagnosis not present

## 2019-12-04 DIAGNOSIS — H31002 Unspecified chorioretinal scars, left eye: Secondary | ICD-10-CM | POA: Diagnosis not present

## 2019-12-04 DIAGNOSIS — H52201 Unspecified astigmatism, right eye: Secondary | ICD-10-CM | POA: Diagnosis not present

## 2019-12-18 DIAGNOSIS — M19011 Primary osteoarthritis, right shoulder: Secondary | ICD-10-CM | POA: Diagnosis not present

## 2019-12-19 ENCOUNTER — Other Ambulatory Visit: Payer: Self-pay | Admitting: Internal Medicine

## 2019-12-20 NOTE — Telephone Encounter (Signed)
Done erx 

## 2020-01-04 ENCOUNTER — Ambulatory Visit (AMBULATORY_SURGERY_CENTER): Payer: Self-pay | Admitting: *Deleted

## 2020-01-04 ENCOUNTER — Other Ambulatory Visit: Payer: Self-pay

## 2020-01-04 VITALS — Ht 63.0 in | Wt 193.0 lb

## 2020-01-04 DIAGNOSIS — Z01818 Encounter for other preprocedural examination: Secondary | ICD-10-CM

## 2020-01-04 NOTE — Progress Notes (Signed)
covid test 01-14-20 at 57  Pt is aware that care partner will wait in the car during procedure; if they feel like they will be too hot or cold to wait in the car; they may wait in the 4 th floor lobby. Patient is aware to bring only one care partner. We want them to wear a mask (we do not have any that we can provide them), practice social distancing, and we will check their temperatures when they get here.  I did remind the patient that their care partner needs to stay in the parking lot the entire time and have a cell phone available, we will call them when the pt is ready for discharge. Patient will wear mask into building.   No egg or soy allergy  No home oxygen use   No medications for weight loss taken  emmi information given  Pt denies constipation issues   No trouble with anesthesia, difficulty with intubation or hx/fam hx of malignant hyperthermia per pt

## 2020-01-08 DIAGNOSIS — M25512 Pain in left shoulder: Secondary | ICD-10-CM | POA: Diagnosis not present

## 2020-01-08 DIAGNOSIS — M25511 Pain in right shoulder: Secondary | ICD-10-CM | POA: Diagnosis not present

## 2020-01-11 ENCOUNTER — Encounter: Payer: Self-pay | Admitting: Gastroenterology

## 2020-01-14 ENCOUNTER — Other Ambulatory Visit (HOSPITAL_COMMUNITY)
Admission: RE | Admit: 2020-01-14 | Discharge: 2020-01-14 | Disposition: A | Discharge status: Home / Self Care | Payer: Medicare Other | Source: Ambulatory Visit | Attending: Gastroenterology | Admitting: Gastroenterology

## 2020-01-14 DIAGNOSIS — Z20822 Contact with and (suspected) exposure to covid-19: Secondary | ICD-10-CM | POA: Insufficient documentation

## 2020-01-14 DIAGNOSIS — Z01812 Encounter for preprocedural laboratory examination: Secondary | ICD-10-CM | POA: Insufficient documentation

## 2020-01-14 LAB — SARS CORONAVIRUS 2 (TAT 6-24 HRS): SARS Coronavirus 2: NEGATIVE

## 2020-01-18 ENCOUNTER — Encounter: Payer: Medicare Other | Admitting: Gastroenterology

## 2020-01-19 ENCOUNTER — Other Ambulatory Visit: Payer: Self-pay

## 2020-01-19 ENCOUNTER — Encounter: Payer: Self-pay | Admitting: Internal Medicine

## 2020-01-19 ENCOUNTER — Ambulatory Visit (AMBULATORY_SURGERY_CENTER): Payer: Medicare Other | Admitting: Internal Medicine

## 2020-01-19 VITALS — BP 150/85 | HR 68 | Temp 98.9°F | Resp 14 | Ht 63.0 in | Wt 193.0 lb

## 2020-01-19 DIAGNOSIS — Z1211 Encounter for screening for malignant neoplasm of colon: Secondary | ICD-10-CM

## 2020-01-19 MED ORDER — SODIUM CHLORIDE 0.9 % IV SOLN: 500.0000 mL | Freq: Once | INTRAVENOUS | Status: DC

## 2020-01-19 NOTE — Progress Notes (Signed)
VS-CW  Pt's states no medical or surgical changes since previsit or office visit.  

## 2020-01-19 NOTE — Patient Instructions (Addendum)
No polyps seen today.  You do have diverticulosis - thickened muscle rings and pouches in the colon wall. Please read the handout about this condition.  Given your history of no polyps and age - no need for another routine repeat colonoscopy.  I appreciate the opportunity to care for you. Gatha Mayer, MD, [facility]   Handout given for diverticulosis.  YOU HAD AN ENDOSCOPIC PROCEDURE TODAY AT [facility]:   Refer to the procedure report that was given to you for any specific questions about what was found during the examination.  If the procedure report does not answer your questions, please call your gastroenterologist to clarify.  If you requested that your care partner not be given the details of your procedure findings, then the procedure report has been included in a sealed envelope for you to review at your convenience later.  YOU SHOULD EXPECT: Some feelings of bloating in the abdomen. Passage of more gas than usual.  Walking can help get rid of the air that was put into your GI tract during the procedure and reduce the bloating. If you had a lower endoscopy (such as a colonoscopy or flexible sigmoidoscopy) you may notice spotting of blood in your stool or on the toilet paper. If you underwent a bowel prep for your procedure, you may not have a normal bowel movement for a few days.  Please Note:  You might notice some irritation and congestion in your nose or some drainage.  This is from the oxygen used during your procedure.  There is no need for concern and it should clear up in a day or so.  SYMPTOMS TO REPORT IMMEDIATELY:   Following lower endoscopy (colonoscopy or flexible sigmoidoscopy):  Excessive amounts of blood in the stool  Significant tenderness or worsening of abdominal pains  Swelling of the abdomen that is new, acute  Fever of 100F or higher  For urgent or emergent issues, a gastroenterologist can be reached at any hour by calling [phone]. Do  not use MyChart messaging for urgent concerns.    DIET:  We do recommend a small meal at first, but then you may proceed to your regular diet.  Drink plenty of fluids but you should avoid alcoholic beverages for 24 hours.  ACTIVITY:  You should plan to take it easy for the rest of today and you should NOT DRIVE or use heavy machinery until tomorrow (because of the sedation medicines used during the test).    FOLLOW UP: Our staff will call the number listed on your records 48-72 hours following your procedure to check on you and address any questions or concerns that you may have regarding the information given to you following your procedure. If we do not reach you, we will leave a message.  We will attempt to reach you two times.  During this call, we will ask if you have developed any symptoms of COVID 19. If you develop any symptoms (ie: fever, flu-like symptoms, shortness of breath, cough etc.) before then, please call [phone].  If you test positive for Covid 19 in the 2 weeks post procedure, please call and report this information to [place].    If any biopsies were taken you will be contacted by phone or by letter within the next 1-3 weeks.  Please call us at [phone] if you have not heard about the biopsies in 3 weeks.    SIGNATURES/CONFIDENTIALITY: You and/or your care partner have signed paperwork which will be  entered into your electronic medical record.  These signatures attest to the fact that that the information above on your After Visit Summary has been reviewed and is understood.  Full responsibility of the confidentiality of this discharge information lies with you and/or your care-partner. 

## 2020-01-19 NOTE — Progress Notes (Signed)
To PACU, VSS. Report to [email or web address] 

## 2020-01-19 NOTE — Op Note (Signed)
[facility] Patient Name: Brooke [name] Procedure Date: 01/19/2020 2:13 PM MRN: [id number] Endoscopist: Gatha Mayer , MD Age: 69 Referring MD:  Date of Birth: 29-Sep-1950 Gender: Female Account #: [id number] Procedure:                Colonoscopy Indications:              Screening for colorectal malignant neoplasm, Last                            colonoscopy: 2011 Medicines:                Propofol per Anesthesia, Monitored Anesthesia Care Procedure:                Pre-Anesthesia Assessment:                           - Prior to the procedure, a History and Physical                            was performed, and patient medications and                            allergies were reviewed. The patient's tolerance of                            previous anesthesia was also reviewed. The risks                            and benefits of the procedure and the sedation                            options and risks were discussed with the patient.                            All questions were answered, and informed consent                            was obtained. Prior Anticoagulants: The patient has                            taken no previous anticoagulant or antiplatelet                            agents. ASA Grade Assessment: III - A patient with                            severe systemic disease. After reviewing the risks                            and benefits, the patient was deemed in                            satisfactory condition to undergo the procedure.  After obtaining informed consent, the colonoscope                            was passed under direct vision. Throughout the                            procedure, the patient's blood pressure, pulse, and                            oxygen saturations were monitored continuously. The                            Colonoscope was introduced through the anus and                            advanced to the  the cecum, identified by                            appendiceal orifice and ileocecal valve. The                            colonoscopy was performed without difficulty. The                            patient tolerated the procedure well. The quality                            of the bowel preparation was good. The bowel                            preparation used was Miralax via split dose                            instruction. The ileocecal valve, appendiceal                            orifice, and rectum were photographed. Scope In: 2:21:49 PM Scope Out: 2:33:14 PM Scope Withdrawal Time: 0 hours 9 minutes 15 seconds  Total Procedure Duration: 0 hours 11 minutes 25 seconds  Findings:                 The perianal and digital rectal examinations were                            normal.                           Multiple small and large-mouthed diverticula were                            found in the sigmoid colon, descending colon,                            transverse colon and ascending colon.  The exam was otherwise without abnormality.                           No additional abnormalities were found on                            retroflexion. Complications:            No immediate complications. Estimated Blood Loss:     Estimated blood loss: none. Impression:               - Diverticulosis in the sigmoid colon, in the                            descending colon, in the transverse colon and in                            the ascending colon.                           - The examination was otherwise normal.                           - No specimens collected. Recommendation:           - Patient has a contact number available for                            emergencies. The signs and symptoms of potential                            delayed complications were discussed with the                            patient. Return to normal activities tomorrow.                             Written discharge instructions were provided to the                            patient.                           - Resume previous diet.                           - Continue present medications.                           - No repeat colonoscopy due to current age (79                            years or older) and the absence of colonic polyps. Gatha Mayer, MD 01/19/2020 2:41:19 PM This report has been signed electronically.

## 2020-01-21 ENCOUNTER — Telehealth: Payer: Self-pay

## 2020-01-21 NOTE — Telephone Encounter (Signed)
°  Follow up Call-  Call back number 01/19/2020  Post procedure Call Back phone  # [phone]  Permission to leave phone message Yes  Some recent data might be hidden     Patient questions:  Do you have a fever, pain , or abdominal swelling? No. Pain Score  0 *  Have you tolerated food without any problems? Yes.    Have you been able to return to your normal activities? Yes.    Do you have any questions about your discharge instructions: Diet   No. Medications  No. Follow up visit  No.  Do you have questions or concerns about your Care? No.  Actions: * If pain score is 4 or above: No action needed, pain <4.  1. Have you developed a fever since your procedure? no  2.   Have you had an respiratory symptoms (SOB or cough) since your procedure? no  3.   Have you tested positive for COVID 19 since your procedure no  4.   Have you had any family members/close contacts diagnosed with the COVID 19 since your procedure?  no   If yes to any of these questions please route to Joylene John, RN and Erenest Rasher, RN

## 2020-01-29 ENCOUNTER — Other Ambulatory Visit: Payer: Self-pay | Admitting: *Deleted

## 2020-01-29 NOTE — Patient Outreach (Signed)
[facility]) Care Management  01/29/2020  Brooke [name] [birth year] [id number]   RN Health Coach Monthly Outreach  Referral Date: 01/26/2019 Referral Source: EMMI Prevent Screening Reason for Referral: Disease Management Education Insurance: [employer]   Outreach Attempt:  Outreach attempt #1 to patient for follow up.  Patient answered and stated she was unable to speak at this time.   Plan:  RN Health Coach will make another outreach attempt within the month of August.   Torrance Stockley RN [place] [phone] [name]

## 2020-02-01 ENCOUNTER — Other Ambulatory Visit: Payer: Self-pay | Admitting: Internal Medicine

## 2020-02-01 NOTE — Telephone Encounter (Signed)
Done erx 

## 2020-02-02 DIAGNOSIS — E119 Type 2 diabetes mellitus without complications: Secondary | ICD-10-CM | POA: Diagnosis not present

## 2020-02-10 ENCOUNTER — Other Ambulatory Visit: Payer: Self-pay | Admitting: Internal Medicine

## 2020-02-22 ENCOUNTER — Other Ambulatory Visit: Payer: Self-pay | Admitting: *Deleted

## 2020-02-22 NOTE — Patient Outreach (Signed)
[facility]) Care Management  02/22/2020  Brooke [name] 11-Aug-1950 [id number]   [place] Monthly Outreach  Referral Date: 01/26/2019 Referral Source: EMMI Prevent Screening Reason for Referral: Disease Management Education Insurance: [employer]   Outreach Attempt:  Outreach attempt #2 to patient for follow up. No answer. RN Health Coach left HIPAA compliant voicemail message along with contact information.  Plan:  RN Health Coach will make another outreach attempt within the month of September if no return call back from patient.   [place] Coach [phone] [name]

## 2020-03-04 ENCOUNTER — Encounter: Payer: Self-pay | Admitting: Internal Medicine

## 2020-03-04 ENCOUNTER — Ambulatory Visit (INDEPENDENT_AMBULATORY_CARE_PROVIDER_SITE_OTHER): Payer: Medicare Other | Admitting: Internal Medicine

## 2020-03-04 ENCOUNTER — Other Ambulatory Visit: Payer: Self-pay

## 2020-03-04 DIAGNOSIS — J4521 Mild intermittent asthma with (acute) exacerbation: Secondary | ICD-10-CM | POA: Diagnosis not present

## 2020-03-04 DIAGNOSIS — M25511 Pain in right shoulder: Secondary | ICD-10-CM

## 2020-03-04 DIAGNOSIS — I10 Essential (primary) hypertension: Secondary | ICD-10-CM

## 2020-03-04 DIAGNOSIS — E119 Type 2 diabetes mellitus without complications: Secondary | ICD-10-CM | POA: Diagnosis not present

## 2020-03-04 DIAGNOSIS — J309 Allergic rhinitis, unspecified: Secondary | ICD-10-CM | POA: Diagnosis not present

## 2020-03-04 DIAGNOSIS — M25512 Pain in left shoulder: Secondary | ICD-10-CM

## 2020-03-04 NOTE — Progress Notes (Signed)
Subjective:    Patient ID: Brooke [name], female    DOB: Oct 14, 1950, 69 y.o.   MRN: [id number]  HPI  Here with 1-2 wks worsening bilateral shoulder pain and swelling, despite recent ortho visit where she had what sounds like a muscular trigger point injection to the left upper back/periscapular area that did help   Mod, constant, sharp, worse to try to raise the arms which she cannot really do past shoudler ht, and Does have several wks ongoing nasal allergy symptoms with clearish congestion, itch and sneezing, without fever, pain, ST, cough, swelling or wheezing.  Pt denies chest pain, increased sob or doe, wheezing, orthopnea, PND, increased LE swelling, palpitations, dizziness or syncope.   Pt denies polydipsia, polyuria,  Past Medical History:  Diagnosis Date  . Alopecia   . Anxiety   . Aortic atherosclerosis ([place])   . Asthma    FOLLOWED BY PCP  . Eczema   . GERD (gastroesophageal reflux disease)   . Gout    04-28-2018--- per pt stable , as been a while since last episode  . Heart murmur   . History of colon polyps   . History of syncope 2015  . Hyperlipidemia   . Hypertension   . Hypothyroidism   . OA (osteoarthritis) of knee    bilateral  . PVC's (premature ventricular contractions)   . Toxic multinodular goiter    03/ 2003  s/p RAI  . Type 2 diabetes mellitus ([place])    followed by pcp  . Ventricular tachycardia, polymorphic ([place]) 01/04/2014   primary cardiologist-- dr Tressia Miners turner (hx monitor 2015 showed couplet PVCs, as trigger)  . Wears glasses   . Wears partial dentures    upper   Past Surgical History:  Procedure Laterality Date  . ABDOMINAL HYSTERECTOMY  10/22/1999   WITH BSO  . CATARACT EXTRACTION W/ INTRAOCULAR LENS IMPLANT Left YRS AGO  . COLONOSCOPY    . EXCISION ABDOMINAL WALL MASS  12-20-2005   dr Margot Chimes @[facility]    neruofibroma  . LAPAROSCOPIC CHOLECYSTECTOMY  10/01/2002   dr Margot Chimes @[facility]   . LEFT HEART CATHETERIZATION WITH CORONARY ANGIOGRAM N/A 01/07/2014    Procedure: LEFT HEART CATHETERIZATION WITH CORONARY ANGIOGRAM;  Surgeon: Peter M Martinique, MD;  Location: [facility] CATH LAB;  Service: Cardiovascular;  Laterality: N/A;  . RASTELLI PROCEDURE  6/98 neg  . RENAL ARTERY STENT Left 11/2003   angioplasty and stenting  . RENAL ARTERY STENT Left 02/2005   re-stenting  . TOTAL KNEE ARTHROPLASTY Right 05/05/2018   Procedure: RIGHT TOTAL KNEE ARTHROPLASTY;  Surgeon: Frederik Pear, MD;  Location: [facility] ORS;  Service: Orthopedics;  Laterality: Right;  . TOTAL KNEE ARTHROPLASTY Left 09/30/2018   Procedure: TOTAL KNEE ARTHROPLASTY;  Surgeon: Melrose Nakayama, MD;  Location: [facility] ORS;  Service: Orthopedics;  Laterality: Left;    reports that she has never smoked. She has never used smokeless tobacco. She reports that she does not drink alcohol and does not use drugs. family history includes Diabetes in her mother; Heart disease in her father. Allergies  Allergen Reactions  . Ace Inhibitors Swelling       . Codeine Nausea And Vomiting and Rash  . Hydrocodone Nausea And Vomiting  . Tramadol Nausea And Vomiting  . Tylenol [Acetaminophen] Itching and Rash   Current Outpatient Medications on File Prior to Visit  Medication Sig Dispense Refill  . albuterol (VENTOLIN HFA) 108 (90 Base) MCG/ACT inhaler Inhale 2 puffs into the lungs every 6 (six) hours as needed for wheezing  or shortness of breath. 18 g 11  . allopurinol (ZYLOPRIM) 100 MG tablet TAKE 1 TABLET BY MOUTH EVERY DAY 90 tablet 3  . AMBULATORY NON FORMULARY MEDICATION Medication Name: Rolling walker 1 Device 0  . amLODipine (NORVASC) 10 MG tablet TAKE 1 TABLET BY MOUTH EVERY DAY 90 tablet 3  . citalopram (CELEXA) 40 MG tablet Take 1 tablet (40 mg total) by mouth daily. 90 tablet 3  . clonazePAM (KLONOPIN) 0.5 MG tablet TAKE 1 TABLET (0.5 MG TOTAL) BY MOUTH 2 (TWO) TIMES DAILY AS NEEDED (VERTIGO). 30 tablet 2  . fexofenadine (ALLEGRA) 180 MG tablet Take 1 tablet (180 mg total) by mouth daily. 30 tablet 11  .  Fluticasone-Salmeterol (WIXELA INHUB) 250-50 MCG/DOSE AEPB Inhale 1 puff into the lungs 2 (two) times daily. 60 each 11  . gabapentin (NEURONTIN) 100 MG capsule Take 1 capsule (100 mg total) by mouth at bedtime. 90 capsule 1  . hydrALAZINE (APRESOLINE) 10 MG tablet Take 1 tablet (10 mg total) by mouth 3 (three) times daily. 90 tablet 11  . hydrochlorothiazide (HYDRODIURIL) 25 MG tablet TAKE 1 TABLET BY MOUTH EVERY DAY 90 tablet 3  . ibuprofen (ADVIL) 800 MG tablet TAKE 1 TABLET BY MOUTH TWICE A DAY AS NEEDED FOR PAIN 60 tablet 2  . meclizine (ANTIVERT) 25 MG tablet TAKE 1 TABLET BY MOUTH 3 TIMES DAILY AS NEEDED FOR DIZZINESS. 30 tablet 5  . metFORMIN (GLUCOPHAGE-XR) 500 MG 24 hr tablet TAKE ONE TABLET BY MOUTH ONCE DAILY WITH BREAKFAST 90 tablet 3  . metoprolol succinate (TOPROL-XL) 100 MG 24 hr tablet Take 1 tablet (100 mg total) by mouth 2 (two) times daily. 180 tablet 3  . omeprazole (PRILOSEC) 20 MG capsule TAKE 1 CAPSULE BY MOUTH TWICE A DAY 180 capsule 3  . potassium chloride SA (KLOR-CON M20) 20 MEQ tablet Take 1 tablet (20 mEq total) by mouth 2 (two) times daily. 180 tablet 1  . rosuvastatin (CRESTOR) 20 MG tablet Take 1 tablet (20 mg total) by mouth daily. Annual appt due in July must see provider for future refills 90 tablet 0  . triamcinolone (NASACORT) 55 MCG/ACT AERO nasal inhaler Place 2 sprays into the nose daily. 1 Inhaler 12  . zolpidem (AMBIEN) 10 MG tablet TAKE 1 TABLET BY MOUTH EVERY DAY AT BEDTIME AS NEEDED FOR SLEEP 90 tablet 1   No current facility-administered medications on file prior to visit.   Review of Systems All otherwise neg per pt    Objective:   Physical Exam BP (!) 160/78 (BP Location: Left Arm, Patient Position: Sitting, Cuff Size: Large)   Pulse 76   Temp 98.6 F (37 C) (Oral)   Ht 5\' 3"  (1.6 m)   Wt 190 lb (86.2 kg)   SpO2 97%   BMI 33.66 kg/m  VS noted,  Constitutional: Pt appears in NAD HENT: Head: NCAT.  Right Ear: External ear normal.  Left  Ear: External ear normal.  Eyes: . Pupils are equal, round, and reactive to light. Conjunctivae and EOM are normal Nose: without d/c or deformity Neck: Neck supple. Gross normal ROM Cardiovascular: Normal rate and regular rhythm.   Pulmonary/Chest: Effort normal and breath sounds without rales or wheezing.  bilat shoulders left > right diffuse nondiscrete tender swelling and decreased rOM Neurological: Pt is alert. At baseline orientation, motor grossly intact Skin: Skin is warm. No rashes, other new lesions, no LE edema Psychiatric: Pt behavior is normal without agitation  All otherwise neg per pt  Lab  Results  Component Value Date   WBC 8.0 12/01/2019   HGB 11.2 (L) 12/01/2019   HCT 34.0 (L) 12/01/2019   PLT 227.0 12/01/2019   GLUCOSE 104 (H) 12/01/2019   CHOL 125 12/01/2019   TRIG 78.0 12/01/2019   HDL 48.40 12/01/2019   LDLDIRECT 139.1 09/05/2009   LDLCALC 61 12/01/2019   ALT 9 12/01/2019   AST 17 12/01/2019   NA 140 12/01/2019   K 3.7 12/01/2019   CL 107 12/01/2019   CREATININE 1.39 (H) 12/01/2019   BUN 23 12/01/2019   CO2 25 12/01/2019   TSH 2.74 12/01/2019   INR 1.0 09/24/2018   HGBA1C 5.9 12/01/2019   MICROALBUR 2.9 (H) 12/01/2019      Assessment & Plan:

## 2020-03-04 NOTE — Patient Instructions (Signed)
You appear to have bursitis in both shoulders that causes the pain and swelling  Please make an appointment with Sports Medicine on the first floor asap   Please continue all other medications as before, and refills have been done if requested.  Please have the pharmacy call with any other refills you may need.  Please continue your efforts at being more active, low cholesterol diet, and weight control.  Please keep your appointments with your specialists as you may have planned

## 2020-03-05 ENCOUNTER — Encounter: Payer: Self-pay | Admitting: Internal Medicine

## 2020-03-05 DIAGNOSIS — M25512 Pain in left shoulder: Secondary | ICD-10-CM | POA: Insufficient documentation

## 2020-03-05 DIAGNOSIS — M25511 Pain in right shoulder: Secondary | ICD-10-CM | POA: Insufficient documentation

## 2020-03-05 NOTE — Assessment & Plan Note (Signed)
Mild to mod, for restart allergy meds with anthistamind and nasal steorid,,  to f/u any worsening symptoms or concerns

## 2020-03-05 NOTE — Assessment & Plan Note (Signed)
stable overall by history and exam, recent data reviewed with pt, and pt to continue medical treatment as before,  to f/u any worsening symptoms or concerns  

## 2020-03-05 NOTE — Assessment & Plan Note (Addendum)
Exam c/w bilateral bursitis, for sport med referral,  to f/u any worsening symptoms or concerns  I spent 31 minutes in preparing to see the patient by review of recent labs, imaging and procedures, obtaining and reviewing separately obtained history, communicating with the patient and family or caregiver, ordering medications, tests or procedures, and documenting clinical information in the EHR including the differential Dx, treatment, and any further evaluation and other management of bilateral shoudler pain, allergy, asthma, htn, dm

## 2020-03-10 ENCOUNTER — Ambulatory Visit (INDEPENDENT_AMBULATORY_CARE_PROVIDER_SITE_OTHER): Payer: Medicare Other

## 2020-03-10 ENCOUNTER — Encounter: Payer: Self-pay | Admitting: Family Medicine

## 2020-03-10 ENCOUNTER — Other Ambulatory Visit: Payer: Self-pay

## 2020-03-10 ENCOUNTER — Ambulatory Visit: Payer: Medicare Other | Admitting: Family Medicine

## 2020-03-10 VITALS — BP 142/92 | HR 74 | Ht 63.0 in | Wt 193.0 lb

## 2020-03-10 DIAGNOSIS — M25512 Pain in left shoulder: Secondary | ICD-10-CM | POA: Diagnosis not present

## 2020-03-10 DIAGNOSIS — M25511 Pain in right shoulder: Secondary | ICD-10-CM

## 2020-03-10 DIAGNOSIS — M19011 Primary osteoarthritis, right shoulder: Secondary | ICD-10-CM | POA: Diagnosis not present

## 2020-03-10 DIAGNOSIS — M19012 Primary osteoarthritis, left shoulder: Secondary | ICD-10-CM | POA: Diagnosis not present

## 2020-03-10 NOTE — Patient Instructions (Signed)
Thank you for coming in today. Plan for xray today and home health PT.  Call or go to the ER if you develop a large red swollen joint with extreme pain or oozing puss.  Recheck in 1-3 weeks for the other injection if helpful.

## 2020-03-10 NOTE — Progress Notes (Signed)
Subjective:    I'm seeing this patient as a consultation for:  Brooke Cower MD. Note will be routed back to referring provider/PCP.  CC: Bursitis bilateral shoulders  HPI: Patient is a 69 year old female presenting to [facility] for bilateral shoulder pain. Patient saw PCP on 03/04/2020 where it was stated left > right diffuse nondiscrete tender swelling and decreased ROM. Patient locates pain to top of shoulder down her arms. Patient describes pain as sharp, constant, and shooting. Patient sees ortho and has had what sounds like a muscular trigger point injection to the left upper back/periscapular area that did help for two days.     Radiating: down both arms  Aggravating factors: shoulder AROM above 90 deg; cannot reach behind her Neck pain:no  Swelling: yes Treatments tried: ibuprofen 800mg  every six hours    Past medical history, Surgical history, Family history, Social history, Allergies, and medications have been entered into the medical record, reviewed.   Review of Systems: No new headache, visual changes, nausea, vomiting, diarrhea, constipation, dizziness, abdominal pain, skin rash, fevers, chills, night sweats, weight loss, swollen lymph nodes, body aches, joint swelling, muscle aches, chest pain, shortness of breath, mood changes, visual or auditory hallucinations.   Objective:    Vitals:   03/10/20 0919  BP: (!) 142/92  Pulse: 74  SpO2: 98%   General: Well Developed, well nourished, and in no acute distress.  Neuro/Psych: Alert and oriented x3, extra-ocular muscles intact, able to move all 4 extremities, sensation grossly intact. Skin: Warm and dry, no rashes noted.  Respiratory: Not using accessory muscles, speaking in full sentences, trachea midline.  Cardiovascular: Pulses palpable, no extremity edema. Abdomen: Does not appear distended. MSK: C-spine Normal-appearing nontender normal motion. Left shoulder normal-appearing Range of motion abduction  100 degrees.  External rotation 20 degrees by neutral position internal rotation lumbar spine. Right shoulder normal-appearing Range of motion normal abduction 100 degrees.  External rotation 20 degrees bandage position.  Internal rotation lumbar spine. Strength intact abduction external rotation within limits of range of motion. Positive Hawkins and Neer's test bilateral shoulders. Positive empty can test bilateral shoulders. Negative Yergason's and speeds test bilaterally. Pulses cap refill and sensation intact.  Lab and Radiology Results  X-ray images bilateral shoulders obtained today personally and independently reviewed  Right: Mild glenohumeral DJD.  Moderate AC DJD.  No acute fractures.  Left: Mild glenohumeral DJD.  Moderate AC DJD.  No acute fractures.  Await formal radiology review  Diagnostic Limited MSK Ultrasound of: Left shoulder Biceps tendon intact normal-appearing Subscapularis tendon normal-appearing Supraspinatus tendon intact without tear.  Increased thickness subacromial bursa present. Infraspinatus tendon normal. AC joint degenerative with effusion. Impression: Subacromial bursitis and AC DJD.  Procedure: Real-time Ultrasound Guided Injection of left shoulder subacromial bursa Device: Philips Affiniti 50G Images permanently stored and available for review in the ultrasound unit. Verbal informed consent obtained.  Discussed risks and benefits of procedure. Warned about infection bleeding damage to structures skin hypopigmentation and fat atrophy among others. Patient expresses understanding and agreement Time-out conducted.   Noted no overlying erythema, induration, or other signs of local infection.   Skin prepped in a sterile fashion.   Local anesthesia: Topical Ethyl chloride.   With sterile technique and under real time ultrasound guidance:  40 mg Kenalog and 2 mL of Marcaine injected easily.   Completed without difficulty   Pain immediately resolved  suggesting accurate placement of the medication.   Advised to call if fevers/chills, erythema, induration,  drainage, or persistent bleeding.   Images permanently stored and available for review in the ultrasound unit.  Impression: Technically successful ultrasound guided injection.        Impression and Recommendations:    Assessment and Plan: 69 y.o. female with bilateral shoulder pain left worse than right.  Patient has significant lack of motion.  My read on x-ray does not show severe DJD.  She had moderate response to subacromial injection on the left for bursitis.  I think it is possible she also has adhesive capsulitis.  Recommend return to clinic in a few weeks and likely will proceed with contralateral right-sided injection would consider glenohumeral injection at that time..  Additionally will add home health physical therapy.  She does not drive so this is a good option for her.   Orders Placed This Encounter  Procedures  . [place] LIMITED JOINT SPACE STRUCTURES UP LEFT    Standing Status:   Future    Number of Occurrences:   1    Standing Expiration Date:   03/10/2021    Order Specific Question:   Reason for Exam (SYMPTOM  OR DIAGNOSIS REQUIRED)    Answer:   bilateral shoulder pain    Order Specific Question:   Preferred imaging location?    Answer:   [facility]  . DG Shoulder Left    Standing Status:   Future    Number of Occurrences:   1    Standing Expiration Date:   03/10/2021    Order Specific Question:   Reason for Exam (SYMPTOM  OR DIAGNOSIS REQUIRED)    Answer:   eval pain    Order Specific Question:   Preferred imaging location?    Answer:   [facility]    Order Specific Question:   Radiology Contrast Protocol - do NOT remove file path    Answer:   \\[email or web address].pdf  . DG Shoulder Right    Standing Status:   Future    Number of Occurrences:   1    Standing Expiration Date:   03/10/2021     Order Specific Question:   Reason for Exam (SYMPTOM  OR DIAGNOSIS REQUIRED)    Answer:   eval pain    Order Specific Question:   Preferred imaging location?    Answer:   [facility]    Order Specific Question:   Radiology Contrast Protocol - do NOT remove file path    Answer:   \\[email or web address]s.pdf  . Ambulatory referral to Home Health    Referral Priority:   Routine    Referral Type:   Home Health Care    Referral Reason:   Specialty Services Required    Requested Specialty:   [place]    Number of Visits Requested:   1   No orders of the defined types were placed in this encounter.   Discussed warning signs or symptoms. Please see discharge instructions. Patient expresses understanding.   The above documentation has been reviewed and is accurate and complete Lynne Leader, M.D.

## 2020-03-11 NOTE — Progress Notes (Signed)
Mild arthritis of the top of the shoulder joint and AC joint.  Main shoulder joint is normal.  Frozen shoulder most likely diagnosis.

## 2020-03-11 NOTE — Progress Notes (Signed)
Mild arthritis of the small joint of the top of the shoulder the [facility] joint

## 2020-03-17 ENCOUNTER — Other Ambulatory Visit: Payer: Self-pay | Admitting: Internal Medicine

## 2020-03-24 ENCOUNTER — Encounter: Payer: Self-pay | Admitting: *Deleted

## 2020-03-24 ENCOUNTER — Other Ambulatory Visit: Payer: Self-pay | Admitting: *Deleted

## 2020-03-24 NOTE — Patient Outreach (Signed)
[facility]) Care Management  [place]  03/24/2020   Brooke [name] 03-Feb-1951 [id number]   [place] Monthly Outreach   Referral Date:  01/26/2019 Referral Source:  EMMI Prevent Screening Reason for Referral:  Disease Management Education Insurance:  [employer]   Outreach Attempt:  Successful telephone outreach to patient for follow up.  HIPAA verified with patient.  Patient reporting she is in much better spirits.  Continues to have shoulder pain, but states her left shoulder feels much better after steroid injection few weeks ago.  Scheduled for right shoulder injection next week.  Blood sugars have been elevated since injection.  Fasting blood sugar this morning was 150 with recent fasting ranges of 150-160's.  Prior to injection fasting blood sugars ranged 90-100's.  Reports she is awaiting home health physical therapy to resume.  Denies any recent falls.  Encounter Medications:  Outpatient Encounter Medications as of 03/24/2020  Medication Sig  . albuterol (VENTOLIN HFA) 108 (90 Base) MCG/ACT inhaler Inhale 2 puffs into the lungs every 6 (six) hours as needed for wheezing or shortness of breath.  . allopurinol (ZYLOPRIM) 100 MG tablet TAKE 1 TABLET BY MOUTH EVERY DAY  . AMBULATORY NON FORMULARY MEDICATION Medication Name: Rolling walker  . amLODipine (NORVASC) 10 MG tablet TAKE 1 TABLET BY MOUTH EVERY DAY  . citalopram (CELEXA) 40 MG tablet Take 1 tablet (40 mg total) by mouth daily.  . clonazePAM (KLONOPIN) 0.5 MG tablet TAKE 1 TABLET (0.5 MG TOTAL) BY MOUTH 2 (TWO) TIMES DAILY AS NEEDED (VERTIGO).  Marland Kitchen fexofenadine (ALLEGRA) 180 MG tablet Take 1 tablet (180 mg total) by mouth daily.  . Fluticasone-Salmeterol (WIXELA INHUB) 250-50 MCG/DOSE AEPB Inhale 1 puff into the lungs 2 (two) times daily.  Marland Kitchen gabapentin (NEURONTIN) 100 MG capsule Take 1 capsule (100 mg total) by mouth at bedtime.  . hydrALAZINE (APRESOLINE) 10 MG tablet Take 1 tablet  (10 mg total) by mouth 3 (three) times daily.  . hydrochlorothiazide (HYDRODIURIL) 25 MG tablet TAKE 1 TABLET BY MOUTH EVERY DAY  . ibuprofen (ADVIL) 800 MG tablet TAKE 1 TABLET BY MOUTH TWICE A DAY AS NEEDED FOR PAIN  . KLOR-CON M20 20 MEQ tablet TAKE 1 TABLET BY MOUTH TWICE A DAY  . meclizine (ANTIVERT) 25 MG tablet TAKE 1 TABLET BY MOUTH 3 TIMES DAILY AS NEEDED FOR DIZZINESS.  . metFORMIN (GLUCOPHAGE-XR) 500 MG 24 hr tablet TAKE ONE TABLET BY MOUTH ONCE DAILY WITH BREAKFAST  . metoprolol succinate (TOPROL-XL) 100 MG 24 hr tablet Take 1 tablet (100 mg total) by mouth 2 (two) times daily.  Marland Kitchen omeprazole (PRILOSEC) 20 MG capsule TAKE 1 CAPSULE BY MOUTH TWICE A DAY  . rosuvastatin (CRESTOR) 20 MG tablet Take 1 tablet (20 mg total) by mouth daily. Annual appt due in July must see provider for future refills  . triamcinolone (NASACORT) 55 MCG/ACT AERO nasal inhaler Place 2 sprays into the nose daily.  Marland Kitchen zolpidem (AMBIEN) 10 MG tablet TAKE 1 TABLET BY MOUTH EVERY DAY AT BEDTIME AS NEEDED FOR SLEEP   No facility-administered encounter medications on file as of 03/24/2020.    Functional Status:  In your present state of health, do you have any difficulty performing the following activities: 11/26/2019 09/11/2019  Hearing? N N  Vision? Y N  Comment trouble seeing out of left eye -  Difficulty concentrating or making decisions? Y N  Walking or climbing stairs? N N  Dressing or bathing? N N  Doing errands,  shopping? N N  Preparing Food and eating ? N N  Using the Toilet? N N  In the past six months, have you accidently leaked urine? N N  Do you have problems with loss of bowel control? N N  Managing your Medications? N N  Managing your Finances? N N  Housekeeping or managing your Housekeeping? N N  Some recent data might be hidden    Fall/Depression Screening: Fall Risk  03/24/2020 12/01/2019 12/01/2019  Falls in the past year? 0 0 0  Number falls in past yr: 0 - 0  Injury with Fall? 0 - 0  Risk  Factor Category  - - -  Risk for fall due to : Impaired mobility;Medication side effect - No Fall Risks  Follow up Falls evaluation completed;Education provided;Falls prevention discussed - Falls evaluation completed   PHQ 2/9 Scores 03/04/2020 12/01/2019 09/11/2019 09/11/2019 04/30/2019 02/18/2019 01/27/2019  PHQ - 2 Score $Remov'3 4 3 3 2 'kTpoBE$ 0 0  PHQ- 9 Score $Remov'7 6 13 14 7 'GFXiiA$ - -   Goals Addressed              This Visit's Progress   .  [facility]) Patient will report maintaining Hgb A1C of below 6 within the next 90 days. (current 5.9) (pt-stated)        CARE PLAN ENTRY (see longtitudinal plan of care for additional care plan information)  Objective:  Lab Results  Component Value Date   HGBA1C 5.9 12/01/2019 .   Lab Results  Component Value Date   CREATININE 1.39 (H) 12/01/2019   CREATININE 1.27 (H) 01/27/2019   CREATININE 1.57 (H) 09/24/2018 .   Marland Kitchen No results found for: EGFR  Current Barriers:  Marland Kitchen Knowledge Deficits related to basic Diabetes pathophysiology and self care/management . Shoulder pain requiring steroid injections and increasing blood sugars  Case Manager Clinical Goal(s):  Over the next 90 days, patient will demonstrate improved adherence to prescribed treatment plan for diabetes self care/management as evidenced by: maintaining Hgb A1C of below 6 . Verbalize adherence to ADA/ carb modified diet within the next 90 days . Verbalize exercise 3 days/week . Verbalize adherence to prescribed medication regimen within the next 90 days  . Verbalize monitoring blood sugars at least 3 times a week  Interventions:  . Provided education to patient about basic DM disease process . Reviewed medications with patient and discussed importance of medication adherence . Discussed plans with patient for ongoing care management follow up and provided patient with direct contact information for care management team . Provided patient with written educational materials related to hypo and hyperglycemia  and importance of correct treatment . Reviewed scheduled/upcoming provider appointments including: upcoming orthopedic follow up . Advised patient, providing education and rationale, to check cbg at least 3 times a week or as provider prescribed and record, calling primary care provider for findings outside established parameters.   . Encouraged patient to contact primary care provider if blood sugars continue to be elevated weeks after next shoulder steroid injection  Patient Self Care Activities:  . Self administers oral medications as prescribed . Attends all scheduled provider appointments . Checks blood sugars as prescribed and utilize hyper and hypoglycemia protocol as needed . Adheres to prescribed ADA/carb modified  Initial goal documentation       Appointments:  Attended appointment with primary care provider, Dr. Jenny Reichmann on 03/04/2020.  Attended orthopedic appointment on 03/10/2020 and has follow up scheduled on 03/29/2020.  Plan: RN Health Coach will send primary care  provider quarterly update. RN Health Coach will make next telephone outreach to patient within the month of November and patient agrees to future outreach.  [place] [phone] [email or web address]

## 2020-03-28 ENCOUNTER — Other Ambulatory Visit: Payer: Self-pay | Admitting: Internal Medicine

## 2020-03-28 NOTE — Telephone Encounter (Signed)
Please refill as per office routine med refill policy (all routine meds refilled for 3 mo or monthly per pt preference up to one year from last visit, then month to month grace period for 3 mo, then further med refills will have to be denied)  

## 2020-03-29 ENCOUNTER — Ambulatory Visit: Payer: Medicare Other | Admitting: Family Medicine

## 2020-03-29 NOTE — Progress Notes (Deleted)
   I, Wendy Poet, LAT, ATC, am serving as scribe for Dr. Lynne Leader.  Brooke [name] is a 69 y.o. female who presents to [place] [facility] today for f/u of B shoulder pain and for a R shoulder injection.  She was last seen by Dr. Georgina Snell on 03/10/20 and had a L subacromial injection.  She was also referred to home health PT.  Since her last visit, pt reports  Diagnostic imaing: R and L shoulder XR- 03/10/20   Pertinent review of systems: ***  Relevant historical information: ***   Exam:  There were no vitals taken for this visit. General: Well Developed, well nourished, and in no acute distress.   MSK: ***    Lab and Radiology Results No results found for this or any previous visit (from the past 72 hour(s)). No results found.     Assessment and Plan: 69 y.o. female with ***   PDMP not reviewed this encounter. No orders of the defined types were placed in this encounter.  No orders of the defined types were placed in this encounter.    Discussed warning signs or symptoms. Please see discharge instructions. Patient expresses understanding.   ***

## 2020-03-31 ENCOUNTER — Ambulatory Visit: Payer: Medicare Other | Admitting: Family Medicine

## 2020-03-31 ENCOUNTER — Other Ambulatory Visit: Payer: Self-pay

## 2020-03-31 ENCOUNTER — Ambulatory Visit: Payer: Self-pay | Admitting: *Deleted

## 2020-03-31 ENCOUNTER — Encounter: Payer: Self-pay | Admitting: Family Medicine

## 2020-03-31 ENCOUNTER — Ambulatory Visit: Payer: Self-pay

## 2020-03-31 VITALS — BP 148/82 | HR 68 | Ht 63.0 in | Wt 190.2 lb

## 2020-03-31 DIAGNOSIS — G8929 Other chronic pain: Secondary | ICD-10-CM | POA: Diagnosis not present

## 2020-03-31 DIAGNOSIS — M25511 Pain in right shoulder: Secondary | ICD-10-CM

## 2020-03-31 NOTE — Progress Notes (Signed)
I, Wendy Poet, LAT, ATC, am serving as scribe for Dr. Lynne Leader.  Brooke [name] is a 69 y.o. female who presents to [place] at [facility] today for f/u of B shoulder pain and for a R shoulder injection.  She was last seen by Dr. Georgina Snell on 03/10/20 and had a L subacromial injection and was referred to home health PT.  Since her last visit, pt reports that her L shoulder is doing much better after the injection she had at her last visit.  Her R shoulder con't to bother her and she would like a R shoulder injection today.  Diagnostic testing: B shoulder XR- 03/10/20   Pertinent review of systems: No fever or chills  Relevant historical information: HTN, DM, CKD   Exam:  BP (!) 148/82 (BP Location: Right Arm, Patient Position: Sitting, Cuff Size: Normal)   Pulse 68   Ht 5\' 3"  (1.6 m)   Wt 190 lb 3.2 oz (86.3 kg)   SpO2 96%   BMI 33.69 kg/m  General: Well Developed, well nourished, and in no acute distress.   MSK: Bilateral shoulders normal motion.  Intact strength abduction. Positive Hawkins and Neer's test right side.    Lab and Radiology Results Procedure: Real-time Ultrasound Guided Injection of shoulder subacromial bursa Device: Philips Affiniti 50G Images permanently stored and available for review in PACS Verbal informed consent obtained.  Discussed risks and benefits of procedure. Warned about infection bleeding damage to structures skin hypopigmentation and fat atrophy among others. Patient expresses understanding and agreement Time-out conducted.   Noted no overlying erythema, induration, or other signs of local infection.   Skin prepped in a sterile fashion.   Local anesthesia: Topical Ethyl chloride.   With sterile technique and under real time ultrasound guidance:  40 mg of Kenalog and 2 mL of Marcaine injected easily.   Completed without difficulty   Pain immediately resolved suggesting accurate placement of the medication.   Advised to  call if fevers/chills, erythema, induration, drainage, or persistent bleeding.   Images permanently stored and available for review in the ultrasound unit.  Impression: Technically successful ultrasound guided injection.         Assessment and Plan: 69 y.o. female with bilateral shoulder pain.  Patient did quite well after the last visit with a left-sided subacromial injection.  Plan to proceed with right steroid injection today as well.  Plan to continue home exercise program.  She notes that she never did get started with home health PT but is feeling well enough that she does not want to consider it at this point.  Of note since last visit she received her first [place] COVID-19 vaccine on [facility] 27th at [facility].  This was added to her chart.  Her second shot is scheduled for 17 September.   PDMP not reviewed this encounter. Orders Placed This Encounter  Procedures  . [place] LIMITED JOINT SPACE STRUCTURES UP RIGHT(NO LINKED CHARGES)    Order Specific Question:   Reason for Exam (SYMPTOM  OR DIAGNOSIS REQUIRED)    Answer:   R shoulder pain    Order Specific Question:   Preferred imaging location?    Answer:   [place]   No orders of the defined types were placed in this encounter.    Discussed warning signs or symptoms. Please see discharge instructions. Patient expresses understanding.   The above documentation has been reviewed and is accurate and complete Lynne Leader, M.D.

## 2020-03-31 NOTE — Patient Instructions (Signed)
Thank you for coming in today. .Call or go to the ER if you develop a large red swollen joint with extreme pain or oozing puss.  Continue the home exercises.  Recheck with me as needed.   Ok to get the 2nd COVID shot on the 17th.

## 2020-04-01 ENCOUNTER — Other Ambulatory Visit: Payer: Self-pay | Admitting: Internal Medicine

## 2020-04-06 ENCOUNTER — Other Ambulatory Visit: Payer: Self-pay | Admitting: Internal Medicine

## 2020-04-06 DIAGNOSIS — Z1231 Encounter for screening mammogram for malignant neoplasm of breast: Secondary | ICD-10-CM

## 2020-05-16 ENCOUNTER — Telehealth: Payer: Self-pay | Admitting: Internal Medicine

## 2020-05-16 ENCOUNTER — Other Ambulatory Visit: Payer: Self-pay | Admitting: Internal Medicine

## 2020-05-16 NOTE — Telephone Encounter (Signed)
Sometimes a higher dose of trazodone is helpful, but I am unsure what to do since I dont know the dose they gave her before

## 2020-05-16 NOTE — Telephone Encounter (Signed)
Patient states Dr. Jenny Reichmann prescribed her Ativan but when [facility] came out they took her off because she was having bad dreams and they changed her to Trazodone and she still isnt falling asleep until about 5 am in the morning. Patient is wondering if there was another medication she could take. Patient # [phone] [id number] [address], [place] Phone:  [phone]  Fax:  [phone]

## 2020-05-16 NOTE — Telephone Encounter (Signed)
Sent to Dr. John. 

## 2020-05-20 ENCOUNTER — Other Ambulatory Visit: Payer: Self-pay | Admitting: Internal Medicine

## 2020-05-20 NOTE — Telephone Encounter (Signed)
Sent to Dr. John. 

## 2020-05-22 MED ORDER — TRAZODONE HCL 100 MG PO TABS: 100.0000 mg | ORAL_TABLET | Freq: Every evening | ORAL | 1 refills | Status: DC | PRN

## 2020-05-22 NOTE — Telephone Encounter (Signed)
Looking at her med list, I wonder if the mention of ativan in this note is incorrect  She does not take ativan, but rather takes klonopin and [place]  Assuming she meant she was taken off the [place], we can increase the trazodon to 100 qhs prn  Ok to let pt know

## 2020-05-22 NOTE — Addendum Note (Signed)
Addended by: Biagio Borg on: 05/22/2020 04:59 PM   Modules accepted: Orders

## 2020-05-24 NOTE — Telephone Encounter (Signed)
LDVM for pt of Dr. John's instructions.  

## 2020-05-25 ENCOUNTER — Other Ambulatory Visit: Payer: Self-pay | Admitting: *Deleted

## 2020-05-25 ENCOUNTER — Encounter: Payer: Self-pay | Admitting: *Deleted

## 2020-05-25 NOTE — Patient Instructions (Signed)
Goals Addressed              This Visit's Progress   .  [facility]) Learn More About My Health        Follow Up Date 08/22/20    - tell my story and reason for my visit - make a list of questions - ask questions - repeat what I heard to make sure I understand - bring a list of my medicines to the visit - speak up when I don't understand -patient will report successful sleep routine with use of or notify provider of continued difficulties    Why is this important?   The best way to learn about your health and care is by talking to the doctor and nurse.  They will answer your questions and give you information in the way that you like best.    Notes:     .  [facility]) Make and Keep All Appointments        Follow Up Date 08/22/20   - ask family or friend for a ride - call to cancel if needed - keep a calendar with appointment dates -schedule primary care follow up appointment    Why is this important?   Part of staying healthy is seeing the doctor for follow-up care.  If you forget your appointments, there are some things you can do to stay on track.    Notes:     .  [facility]) Monitor and Manage My Blood Sugar        Follow Up Date 11/19/2020   - check blood sugar at prescribed times - check blood sugar if I feel it is too high or too low - enter blood sugar readings and medication or insulin into daily log - take the blood sugar log to all doctor visits - take the blood sugar meter to all doctor visits    Why is this important?   Checking your blood sugar at home helps to keep it from getting very high or very low.  Writing the results in a diary or log helps the doctor know how to care for you.  Your blood sugar log should have the time, date and the results.  Also, write down the amount of insulin or other medicine that you take.  Other information, like what you ate, exercise done and how you were feeling, will also be helpful.     Notes:     .  [facility]) Obtain Eye Exam         Follow Up Date 08/22/20   - keep appointment with eye doctor -Patient will contact eye provider (Dr. Delman Cheadle) and request current eye exam be sent to primary care provider for chart entry     Why is this important?   Eye check-ups are important when you have diabetes.  Vision loss can be prevented.    Notes:     .  COMPLETED: [facility]) Patient will report maintaining Hgb A1C of below 6 within the next 90 days. (current 5.9) (pt-stated)        CARE PLAN ENTRY (see longtitudinal plan of care for additional care plan information)  Objective:  Lab Results  Component Value Date   HGBA1C 5.9 12/01/2019 .   Lab Results  Component Value Date   CREATININE 1.39 (H) 12/01/2019   CREATININE 1.27 (H) 01/27/2019   CREATININE 1.57 (H) 09/24/2018 .   Marland Kitchen No results found for: EGFR  Current Barriers:  Marland Kitchen Knowledge Deficits related to basic  Diabetes pathophysiology and self care/management . Shoulder pain requiring steroid injections and increasing blood sugars  Case Manager Clinical Goal(s):  Over the next 90 days, patient will demonstrate improved adherence to prescribed treatment plan for diabetes self care/management as evidenced by: maintaining Hgb A1C of below 6 . Verbalize adherence to ADA/ carb modified diet within the next 90 days . Verbalize exercise 3 days/week . Verbalize adherence to prescribed medication regimen within the next 90 days  . Verbalize monitoring blood sugars at least 3 times a week  Interventions:  . Provided education to patient about basic DM disease process . Reviewed medications with patient and discussed importance of medication adherence . Discussed plans with patient for ongoing care management follow up and provided patient with direct contact information for care management team . Provided patient with written educational materials related to hypo and hyperglycemia and importance of correct treatment . Reviewed scheduled/upcoming provider appointments including:  upcoming orthopedic follow up . Advised patient, providing education and rationale, to check cbg at least 3 times a week or as provider prescribed and record, calling primary care provider for findings outside established parameters.   . Encouraged patient to contact primary care provider if blood sugars continue to be elevated weeks after next shoulder steroid injection  Patient Self Care Activities:  . Self administers oral medications as prescribed . Attends all scheduled provider appointments . Checks blood sugars as prescribed and utilize hyper and hypoglycemia protocol as needed . Adheres to prescribed ADA/carb modified  Initial goal documentation    Resolving due to duplicate goals    .  [facility]) Set My Target A1C        Follow Up Date 08/22/20    - set target A1C; -discuss with provider goal Hgb A1C, current is 5.9    Why is this important?   Your target A1C is decided together by you and your doctor.  It is based on several things like your age and other health issues.    Notes:     .  [facility]) Track and Manage My Blood Pressure        Follow Up Date 11/19/20    - check blood pressure 3 times per week - choose a place to take my blood pressure (home, clinic or office, retail store) - write blood pressure results in a log or diary    Why is this important?   You won't feel high blood pressure, but it can still hurt your blood vessels.  High blood pressure can cause heart or kidney problems. It can also cause a stroke.  Making lifestyle changes like losing a little weight or eating less salt will help.  Checking your blood pressure at home and at different times of the day can help to control blood pressure.  If the doctor prescribes medicine remember to take it the way the doctor ordered.  Call the office if you cannot afford the medicine or if there are questions about it.     Notes:

## 2020-05-25 NOTE — Patient Outreach (Signed)
[facility]) Care Management  [place]  05/25/2020   Brooke [name] 1950/08/09 [id number]   [place] Monthly Outreach  Referral Date: 01/26/2019 Referral Source: EMMI Prevent Screening Reason for Referral: Disease Management Education Insurance: [employer]   Outreach Attempt:  Successful telephone outreach to patient for follow up.  HIPAA verified with patient.  Patient reporting her shoulder pain is better after steroid injections to both shoulders.  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  Fasting blood sugar this morning was 99 with fasting ranges of 89-159 (mainly 100's).  Denies any recent hypoglycemic episodes.  Reports she has attended her yearly eye exam.  Encouraged patient to contact eye provider and request report be sent to primary care provider.  Appointments:  Patient has scheduled appointment with Cardiology on 06/01/2020.  Encouraged to schedule primary care follow up as soon as possible.  Plan: RN Health Coach will make next telephone outreach to patient within the month of January and patient agrees to future outreach.  [place] [phone] [name]

## 2020-05-26 ENCOUNTER — Ambulatory Visit: Payer: Medicare Other

## 2020-06-01 ENCOUNTER — Ambulatory Visit: Payer: Medicare Other | Admitting: Cardiology

## 2020-06-01 ENCOUNTER — Encounter (INDEPENDENT_AMBULATORY_CARE_PROVIDER_SITE_OTHER): Payer: Self-pay

## 2020-06-01 ENCOUNTER — Other Ambulatory Visit: Payer: Self-pay

## 2020-06-01 ENCOUNTER — Encounter: Payer: Self-pay | Admitting: Cardiology

## 2020-06-01 VITALS — BP 128/64 | HR 71 | Ht 63.0 in | Wt 188.6 lb

## 2020-06-01 DIAGNOSIS — E7800 Pure hypercholesterolemia, unspecified: Secondary | ICD-10-CM

## 2020-06-01 DIAGNOSIS — I4729 Other ventricular tachycardia: Secondary | ICD-10-CM

## 2020-06-01 DIAGNOSIS — R079 Chest pain, unspecified: Secondary | ICD-10-CM | POA: Diagnosis not present

## 2020-06-01 DIAGNOSIS — I472 Ventricular tachycardia: Secondary | ICD-10-CM | POA: Diagnosis not present

## 2020-06-01 DIAGNOSIS — I10 Essential (primary) hypertension: Secondary | ICD-10-CM

## 2020-06-01 NOTE — Patient Instructions (Signed)
Medication Instructions:  Your physician recommends that you continue on your current medications as directed. Please refer to the Current Medication list given to you today.  *If you need a refill on your cardiac medications before your next appointment, please call your pharmacy*  Follow-Up: At [facility], you and your health needs are our priority.  As part of our continuing mission to provide you with exceptional heart care, we have created designated Provider Care Teams.  These Care Teams include your primary Cardiologist (physician) and Advanced Practice Providers (APPs -  Physician Assistants and Nurse Practitioners) who all work together to provide you with the care you need, when you need it.  We recommend signing up for the patient portal called "MyChart".  Sign up information is provided on this After Visit Summary.  MyChart is used to connect with patients for Virtual Visits (Telemedicine).  Patients are able to view lab/test results, encounter notes, upcoming appointments, etc.  Non-urgent messages can be sent to your provider as well.   To learn more about what you can do with MyChart, go to [email or web address].    Your next appointment:   1 year(s)  The format for your next appointment:   In Person  Provider:   You may see Traci Turner, MD or one of the following Advanced Practice Providers on your designated Care Team:    Dayna Dunn, PA-C  Michele Lenze, PA-C  
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
## 2020-06-06 ENCOUNTER — Other Ambulatory Visit: Payer: Self-pay

## 2020-06-06 ENCOUNTER — Ambulatory Visit
Admission: RE | Admit: 2020-06-06 | Discharge: 2020-06-06 | Disposition: A | Discharge status: Home / Self Care | Payer: Medicare Other | Source: Ambulatory Visit | Attending: Internal Medicine | Admitting: Internal Medicine

## 2020-06-06 DIAGNOSIS — Z1231 Encounter for screening mammogram for malignant neoplasm of breast: Secondary | ICD-10-CM | POA: Diagnosis not present

## 2020-06-20 ENCOUNTER — Other Ambulatory Visit: Payer: Self-pay

## 2020-06-21 ENCOUNTER — Encounter: Payer: Self-pay | Admitting: Internal Medicine

## 2020-06-21 ENCOUNTER — Ambulatory Visit (INDEPENDENT_AMBULATORY_CARE_PROVIDER_SITE_OTHER): Payer: Medicare Other | Admitting: Internal Medicine

## 2020-06-21 VITALS — BP 140/70 | HR 72 | Temp 98.5°F | Ht 63.0 in | Wt 186.0 lb

## 2020-06-21 DIAGNOSIS — E7800 Pure hypercholesterolemia, unspecified: Secondary | ICD-10-CM

## 2020-06-21 DIAGNOSIS — Z23 Encounter for immunization: Secondary | ICD-10-CM

## 2020-06-21 DIAGNOSIS — D649 Anemia, unspecified: Secondary | ICD-10-CM

## 2020-06-21 DIAGNOSIS — I10 Essential (primary) hypertension: Secondary | ICD-10-CM | POA: Diagnosis not present

## 2020-06-21 DIAGNOSIS — E119 Type 2 diabetes mellitus without complications: Secondary | ICD-10-CM | POA: Diagnosis not present

## 2020-06-21 DIAGNOSIS — E559 Vitamin D deficiency, unspecified: Secondary | ICD-10-CM

## 2020-06-21 DIAGNOSIS — N1831 Chronic kidney disease, stage 3a: Secondary | ICD-10-CM

## 2020-06-21 LAB — BASIC METABOLIC PANEL
BUN: 20 mg/dL (ref 6–23)
CO2: 25 mEq/L (ref 19–32)
Calcium: 9.7 mg/dL (ref 8.4–10.5)
Chloride: 109 mEq/L (ref 96–112)
Creatinine, Ser: 1.62 mg/dL — ABNORMAL HIGH (ref 0.40–1.20)
GFR: 32.46 mL/min — ABNORMAL LOW (ref >60.00)
Glucose, Bld: 95 mg/dL (ref 70–99)
Potassium: 3.9 mEq/L (ref 3.5–5.1)
Sodium: 142 mEq/L (ref 135–145)

## 2020-06-21 LAB — HEPATIC FUNCTION PANEL
ALT: 8 U/L (ref 0–35)
AST: 14 U/L (ref 0–37)
Albumin: 4.1 g/dL (ref 3.5–5.2)
Alkaline Phosphatase: 77 U/L (ref 39–117)
Bilirubin, Direct: 0.1 mg/dL (ref 0.0–0.3)
Total Bilirubin: 0.3 mg/dL (ref 0.2–1.2)
Total Protein: 7.3 g/dL (ref 6.0–8.3)

## 2020-06-21 LAB — LIPID PANEL
Cholesterol: 134 mg/dL (ref 0–200)
HDL: 50.7 mg/dL (ref >39.00)
LDL Cholesterol: 62 mg/dL (ref 0–99)
NonHDL: 83.04
Total CHOL/HDL Ratio: 3
Triglycerides: 104 mg/dL (ref 0.0–149.0)
VLDL: 20.8 mg/dL (ref 0.0–40.0)

## 2020-06-21 LAB — PHOSPHORUS: Phosphorus: 3.4 mg/dL (ref 2.3–4.6)

## 2020-06-21 LAB — HEMOGLOBIN A1C: Hgb A1c MFr Bld: 5.9 % (ref 4.6–6.5)

## 2020-06-21 LAB — VITAMIN D 25 HYDROXY (VIT D DEFICIENCY, FRACTURES): VITD: 57.42 ng/mL (ref 30.00–100.00)

## 2020-06-21 MED ORDER — LOSARTAN POTASSIUM-HCTZ 100-25 MG PO TABS: 1.0000 | ORAL_TABLET | Freq: Every day | ORAL | 3 refills | Status: DC

## 2020-06-21 NOTE — Progress Notes (Signed)
Subjective:    Patient ID: Brooke [name], female    DOB: [birth year], 69 y.o.   MRN: [id number]  HPI  Here to f/u; overall doing ok,  Pt denies chest pain, increasing sob or doe, wheezing, orthopnea, PND, increased LE swelling, palpitations, dizziness or syncope.  Pt denies new neurological symptoms such as new headache, or facial or extremity weakness or numbness.  Pt denies polydipsia, polyuria, or low sugar episode.  Pt states overall good compliance with meds, mostly trying to follow appropriate diet, with wt overall stable,  but little exercise however. No new complaints Past Medical History:  Diagnosis Date  . Alopecia   . Anxiety   . Aortic atherosclerosis ([place])   . Asthma    FOLLOWED BY PCP  . Eczema   . GERD (gastroesophageal reflux disease)   . Gout    04-28-2018--- per pt stable , as been a while since last episode  . Heart murmur   . History of colon polyps   . History of syncope 2015  . Hyperlipidemia   . Hypertension   . Hypothyroidism   . OA (osteoarthritis) of knee    bilateral  . PVC's (premature ventricular contractions)   . Toxic multinodular goiter    03/ 2003  s/p RAI  . Type 2 diabetes mellitus ([place])    followed by pcp  . Ventricular tachycardia, polymorphic ([place]) 01/04/2014   primary cardiologist-- dr Tressia Miners turner (hx monitor 2015 showed couplet PVCs, as trigger)  . Wears glasses   . Wears partial dentures    upper   Past Surgical History:  Procedure Laterality Date  . ABDOMINAL HYSTERECTOMY  10/22/1999   WITH BSO  . CATARACT EXTRACTION W/ INTRAOCULAR LENS IMPLANT Left YRS AGO  . COLONOSCOPY    . EXCISION ABDOMINAL WALL MASS  12-20-2005   dr Margot Chimes @[facility]    neruofibroma  . LAPAROSCOPIC CHOLECYSTECTOMY  10/01/2002   dr Margot Chimes @[facility]   . LEFT HEART CATHETERIZATION WITH CORONARY ANGIOGRAM N/A 01/07/2014   Procedure: LEFT HEART CATHETERIZATION WITH CORONARY ANGIOGRAM;  Surgeon: Peter M Martinique, MD;  Location: [facility] CATH LAB;  Service: Cardiovascular;   Laterality: N/A;  . RASTELLI PROCEDURE  6/98 neg  . RENAL ARTERY STENT Left 11/2003   angioplasty and stenting  . RENAL ARTERY STENT Left 02/2005   re-stenting  . TOTAL KNEE ARTHROPLASTY Right 05/05/2018   Procedure: RIGHT TOTAL KNEE ARTHROPLASTY;  Surgeon: Frederik Pear, MD;  Location: [facility];  Service: Orthopedics;  Laterality: Right;  . TOTAL KNEE ARTHROPLASTY Left 09/30/2018   Procedure: TOTAL KNEE ARTHROPLASTY;  Surgeon: Melrose Nakayama, MD;  Location: [facility];  Service: Orthopedics;  Laterality: Left;    reports that she has never smoked. She has never used smokeless tobacco. She reports that she does not drink alcohol and does not use drugs. family history includes Diabetes in her mother; Heart disease in her father. Allergies  Allergen Reactions  . Ace Inhibitors Swelling       . Codeine Nausea And Vomiting and Rash  . Hydrocodone Nausea And Vomiting  . Tramadol Nausea And Vomiting  . Tylenol [Acetaminophen] Itching and Rash   Current Outpatient Medications on File Prior to Visit  Medication Sig Dispense Refill  . albuterol (VENTOLIN HFA) 108 (90 Base) MCG/ACT inhaler Inhale 2 puffs into the lungs every 6 (six) hours as needed for wheezing or shortness of breath. 18 g 11  . allopurinol (ZYLOPRIM) 100 MG tablet TAKE 1 TABLET BY MOUTH EVERY DAY 90 tablet 3  .  AMBULATORY NON FORMULARY MEDICATION Medication Name: Rolling walker 1 Device 0  . amLODipine (NORVASC) 10 MG tablet TAKE 1 TABLET BY MOUTH EVERY DAY 90 tablet 3  . citalopram (CELEXA) 40 MG tablet Take 1 tablet (40 mg total) by mouth daily. 90 tablet 3  . clonazePAM (KLONOPIN) 0.5 MG tablet TAKE 1 TABLET (0.5 MG TOTAL) BY MOUTH 2 (TWO) TIMES DAILY AS NEEDED (VERTIGO). 30 tablet 2  . fexofenadine (ALLEGRA) 180 MG tablet Take 1 tablet (180 mg total) by mouth daily. 30 tablet 11  . Fluticasone-Salmeterol (WIXELA INHUB) 250-50 MCG/DOSE AEPB Inhale 1 puff into the lungs 2 (two) times daily. 60 each 11  . gabapentin (NEURONTIN) 100  MG capsule Take 1 capsule (100 mg total) by mouth at bedtime. 90 capsule 1  . hydrALAZINE (APRESOLINE) 10 MG tablet Take 1 tablet (10 mg total) by mouth 3 (three) times daily. 90 tablet 11  . hydrochlorothiazide (HYDRODIURIL) 25 MG tablet TAKE 1 TABLET BY MOUTH EVERY DAY 90 tablet 3  . ibuprofen (ADVIL) 800 MG tablet TAKE 1 TABLET BY MOUTH TWICE A DAY AS NEEDED FOR PAIN 60 tablet 2  . KLOR-CON M20 20 MEQ tablet TAKE 1 TABLET BY MOUTH TWICE A DAY 180 tablet 1  . meclizine (ANTIVERT) 25 MG tablet TAKE 1 TABLET BY MOUTH 3 TIMES DAILY AS NEEDED FOR DIZZINESS. 30 tablet 5  . metFORMIN (GLUCOPHAGE-XR) 500 MG 24 hr tablet TAKE ONE TABLET BY MOUTH ONCE DAILY WITH BREAKFAST 90 tablet 3  . metoprolol succinate (TOPROL-XL) 100 MG 24 hr tablet TAKE 1 TABLET BY MOUTH TWICE A DAY 180 tablet 3  . omeprazole (PRILOSEC) 20 MG capsule TAKE 1 CAPSULE BY MOUTH TWICE A DAY 180 capsule 3  . rosuvastatin (CRESTOR) 20 MG tablet TAKE 1 TABLET (20 MG TOTAL) BY MOUTH DAILY. ANNUAL APPT DUE IN JULY 90 tablet 0  . traZODone (DESYREL) 100 MG tablet Take 1 tablet (100 mg total) by mouth at bedtime as needed for sleep. 90 tablet 1  . triamcinolone (NASACORT) 55 MCG/ACT AERO nasal inhaler Place 2 sprays into the nose daily. 1 Inhaler 12   No current facility-administered medications on file prior to visit.   Review of Systems All otherwise neg per pt    Objective:   Physical Exam BP 140/70 (BP Location: Left Arm, Patient Position: Sitting, Cuff Size: Large)   Pulse 72   Temp 98.5 F (36.9 C) (Oral)   Ht 5\' 3"  (1.6 m)   Wt 186 lb (84.4 kg)   SpO2 96%   BMI 32.95 kg/m  VS noted,  Constitutional: Pt appears in NAD HENT: Head: NCAT.  Right Ear: External ear normal.  Left Ear: External ear normal.  Eyes: . Pupils are equal, round, and reactive to light. Conjunctivae and EOM are normal Nose: without d/c or deformity Neck: Neck supple. Gross normal ROM Cardiovascular: Normal rate and regular rhythm.   Pulmonary/Chest:  Effort normal and breath sounds without rales or wheezing.  Abd:  Soft, NT, ND, + BS, no organomegaly Neurological: Pt is alert. At baseline orientation, motor grossly intact Skin: Skin is warm. No rashes, other new lesions, no LE edema Psychiatric: Pt behavior is normal without agitation  All otherwise neg per pt Lab Results  Component Value Date   WBC 8.0 12/01/2019   HGB 11.2 (L) 12/01/2019   HCT 34.0 (L) 12/01/2019   PLT 227.0 12/01/2019   GLUCOSE 95 06/21/2020   CHOL 134 06/21/2020   TRIG 104.0 06/21/2020   HDL 50.70 06/21/2020  LDLDIRECT 139.1 09/05/2009   LDLCALC 62 06/21/2020   ALT 8 06/21/2020   AST 14 06/21/2020   NA 142 06/21/2020   K 3.9 06/21/2020   CL 109 06/21/2020   CREATININE 1.62 (H) 06/21/2020   BUN 20 06/21/2020   CO2 25 06/21/2020   TSH 2.74 12/01/2019   INR 1.0 09/24/2018   HGBA1C 5.9 06/21/2020   MICROALBUR 2.9 (H) 12/01/2019      Assessment & Plan:

## 2020-06-21 NOTE — Patient Instructions (Addendum)
You had the flu shot today  Ok to stop the hydralazine 10 mg that you were taking three times per day, AND stop the fluid pill HCT 25 mg  Please take all new medication as prescribed - the losartan-HCT 100-25 mg per day  Please continue all other medications as before, and refills have been done if requested.  Please have the pharmacy call with any other refills you may need.  Please continue your efforts at being more active, low cholesterol diet, and weight control.  Please keep your appointments with your specialists as you may have planned  Please go to the LAB at the blood drawing area for the tests to be done  You will be contacted by phone if any changes need to be made immediately.  Otherwise, you will receive a letter about your results with an explanation, but please check with MyChart first.  Please remember to sign up for MyChart if you have not done so, as this will be important to you in the future with finding out test results, communicating by private email, and scheduling acute appointments online when needed.  Please make an Appointment to return in 6 months, or sooner if needed, also with Lab Appointment for testing done 3-5 days before at the [place] (so this is for TWO appointments - please see the scheduling desk as you leave)  Due to the ongoing Covid 19 pandemic, our lab now requires an appointment for any labs done at our office.  If you need labs done and do not have an appointment, please call our office ahead of time to schedule before presenting to the lab for your testing.

## 2020-06-22 LAB — EXTRA SPECIMEN

## 2020-06-22 LAB — PTH, INTACT AND CALCIUM
Calcium: 9.5 mg/dL (ref 8.6–10.4)
PTH: 15 pg/mL (ref 14–64)

## 2020-06-26 ENCOUNTER — Encounter: Payer: Self-pay | Admitting: Internal Medicine

## 2020-06-26 ENCOUNTER — Other Ambulatory Visit: Payer: Self-pay | Admitting: Internal Medicine

## 2020-06-26 DIAGNOSIS — M1000 Idiopathic gout, unspecified site: Secondary | ICD-10-CM

## 2020-06-26 NOTE — Assessment & Plan Note (Signed)
stable overall by history and exam, recent data reviewed with pt, and pt to continue medical treatment as before,  to f/u any worsening symptoms or concerns  

## 2020-06-26 NOTE — Assessment & Plan Note (Signed)
stable overall by history and exam, recent data reviewed with pt, and pt to continue medical treatment as before,  to f/u any worsening symptoms or concerns, for f/u lab 

## 2020-06-26 NOTE — Addendum Note (Signed)
Addended by: Biagio Borg on: 06/26/2020 09:42 PM   Modules accepted: Orders

## 2020-06-26 NOTE — Assessment & Plan Note (Addendum)
Cont oral replacement  I spent 31 minutes in preparing to see the patient by review of recent labs, imaging and procedures, obtaining and reviewing separately obtained history, communicating with the patient and family or caregiver, ordering medications, tests or procedures, and documenting clinical information in the EHR including the differential Dx, treatment, and any further evaluation and other management of vit d def, anemai, hld, htn, dm ckd

## 2020-06-26 NOTE — Assessment & Plan Note (Signed)
Lab Results  Component Value Date   LDLCALC 62 06/21/2020  stable overall by history and exam, recent data reviewed with pt, and pt to continue medical treatment as before,  to f/u any worsening symptoms or concerns

## 2020-06-26 NOTE — Assessment & Plan Note (Signed)
Lab Results  Component Value Date   HGBA1C 5.9 06/21/2020  stable overall by history and exam, recent data reviewed with pt, and pt to continue medical treatment as before,  to f/u any worsening symptoms or concerns

## 2020-06-26 NOTE — Assessment & Plan Note (Signed)
stable overall by history and exam, recent data reviewed with pt, and pt to continue medical treatment as before,  to f/u any worsening symptoms or concerns BP Readings from Last 3 Encounters:  06/21/20 140/70  06/01/20 128/64  03/31/20 (!) 148/82

## 2020-06-26 NOTE — Telephone Encounter (Signed)
Please refill as per office routine med refill policy (all routine meds refilled for 3 mo or monthly per pt preference up to one year from last visit, then month to month grace period for 3 mo, then further med refills will have to be denied)  

## 2020-07-04 DIAGNOSIS — E119 Type 2 diabetes mellitus without complications: Secondary | ICD-10-CM | POA: Diagnosis not present

## 2020-07-06 ENCOUNTER — Other Ambulatory Visit: Payer: Self-pay | Admitting: Internal Medicine

## 2020-07-06 NOTE — Telephone Encounter (Signed)
Please refill as per office routine med refill policy (all routine meds refilled for 3 mo or monthly per pt preference up to one year from last visit, then month to month grace period for 3 mo, then further med refills will have to be denied)  

## 2020-08-03 ENCOUNTER — Telehealth: Payer: Self-pay | Admitting: Internal Medicine

## 2020-08-03 ENCOUNTER — Other Ambulatory Visit: Payer: Self-pay | Admitting: *Deleted

## 2020-08-03 NOTE — Patient Outreach (Signed)
[facility]) Care Management  [place]  08/03/2020   Brooke [name] Aug 16, 1950 [id number]  RN Health Coach telephone call to patient.  [employer] compliance verified. Per patient her fasting blood sugar was 119.  Per patient she does some arm stretching exercises sometimes. Patient drinks water, sodas and orange juice. RN discussed decreasing drinking soda intake and drinking more water to protect kidneys. Patient has received flu vaccine and two Covid vaccines. Patient has agreed to further outreach calls.    Encounter Medications:  Outpatient Encounter Medications as of 08/03/2020  Medication Sig Note  . albuterol (VENTOLIN HFA) 108 (90 Base) MCG/ACT inhaler Inhale 2 puffs into the lungs every 6 (six) hours as needed for wheezing or shortness of breath.   . allopurinol (ZYLOPRIM) 100 MG tablet TAKE 1 TABLET BY MOUTH EVERY DAY   . AMBULATORY NON FORMULARY MEDICATION Medication Name: Rolling walker   . amLODipine (NORVASC) 10 MG tablet TAKE 1 TABLET BY MOUTH EVERY DAY   . citalopram (CELEXA) 40 MG tablet Take 1 tablet (40 mg total) by mouth daily.   . clonazePAM (KLONOPIN) 0.5 MG tablet TAKE 1 TABLET (0.5 MG TOTAL) BY MOUTH 2 (TWO) TIMES DAILY AS NEEDED (VERTIGO). 05/25/2020: Reports having not taken medication in a while  . fexofenadine (ALLEGRA) 180 MG tablet Take 1 tablet (180 mg total) by mouth daily.   . Fluticasone-Salmeterol (WIXELA INHUB) 250-50 MCG/DOSE AEPB Inhale 1 puff into the lungs 2 (two) times daily.   [place] Kitchen gabapentin (NEURONTIN) 100 MG capsule Take 1 capsule (100 mg total) by mouth at bedtime.   . hydrALAZINE (APRESOLINE) 10 MG tablet Take 1 tablet (10 mg total) by mouth 3 (three) times daily.   . hydrochlorothiazide (HYDRODIURIL) 25 MG tablet TAKE 1 TABLET BY MOUTH EVERY DAY   . ibuprofen (ADVIL) 800 MG tablet TAKE 1 TABLET BY MOUTH TWICE A DAY AS NEEDED FOR PAIN   . KLOR-CON M20 20 MEQ tablet TAKE 1 TABLET BY MOUTH TWICE A DAY   .  losartan-hydrochlorothiazide (HYZAAR) 100-25 MG tablet Take 1 tablet by mouth daily.   . meclizine (ANTIVERT) 25 MG tablet TAKE 1 TABLET BY MOUTH 3 TIMES DAILY AS NEEDED FOR DIZZINESS. 05/25/2020: Reports not having taken in a while  . metFORMIN (GLUCOPHAGE-XR) 500 MG 24 hr tablet TAKE ONE TABLET BY MOUTH ONCE DAILY WITH BREAKFAST   . metoprolol succinate (TOPROL-XL) 100 MG 24 hr tablet TAKE 1 TABLET BY MOUTH TWICE A DAY   . omeprazole (PRILOSEC) 20 MG capsule TAKE 1 CAPSULE BY MOUTH TWICE A DAY   . rosuvastatin (CRESTOR) 20 MG tablet TAKE 1 TABLET (20 MG TOTAL) BY MOUTH DAILY. ANNUAL APPT DUE IN [place]   . traZODone (DESYREL) 100 MG tablet Take 1 tablet (100 mg total) by mouth at bedtime as needed for sleep. 05/25/2020: Picking up prescription today  . triamcinolone (NASACORT) 55 MCG/ACT AERO nasal inhaler Place 2 sprays into the nose daily.    No facility-administered encounter medications on file as of 08/03/2020.    Functional Status:  In your present state of health, do you have any difficulty performing the following activities: 06/21/2020 11/26/2019  Hearing? N N  Vision? N Y  Comment - trouble seeing out of left eye  Difficulty concentrating or making decisions? N Y  Walking or climbing stairs? N N  Dressing or bathing? N N  Doing errands, shopping? N N  Preparing Food and eating ? - N  Using the Toilet? - N  In the  past six months, have you accidently leaked urine? - N  Do you have problems with loss of bowel control? - N  Managing your Medications? - N  Managing your Finances? - N  Housekeeping or managing your Housekeeping? - N  Some recent data might be hidden    Fall/Depression Screening: Fall Risk  08/03/2020 06/21/2020 03/24/2020  Falls in the past year? 0 0 0  Number falls in past yr: 0 0 0  Injury with Fall? 0 0 0  Risk Factor Category  - - -  Risk for fall due to : Impaired balance/gait;Impaired mobility No Fall Risks Impaired mobility;Medication side effect  Follow up  Falls evaluation completed Falls evaluation completed Falls evaluation completed;Education provided;Falls prevention discussed   PHQ 2/9 Scores 08/03/2020 06/21/2020 03/04/2020 12/01/2019 09/11/2019 09/11/2019 04/30/2019  PHQ - 2 Score 0 0 3 4 3 3 2   PHQ- 9 Score - 0 7 6 13 14 7     Assessment:  Goals Addressed            This Visit's Progress   . [facility]) Learn More About My Health   On track    Timeframe:  Short-Term Goal Priority:  Medium Start Date:   [id number]                          Expected End Date: [id number]                     Follow Up Date [id number]   - tell my story and reason for my visit - make a list of questions - ask questions - repeat what I heard to make sure I understand - bring a list of my medicines to the visit - speak up when I don't understand -patient will report successful sleep routine with use of or notify provider of continued difficulties    Why is this important?   The best way to learn about your health and care is by talking to the doctor and nurse.  They will answer your questions and give you information in the way that you like best.    Notes:     . [facility]) Make and Keep All Appointments   On track    Timeframe:  Long-Range Goal Priority:  Medium Start Date:  [id number]                           Expected End Date:  15176160                    Follow Up Date [id number]   - ask family or friend for a ride - call to cancel if needed - keep a calendar with appointment dates -schedule primary care follow up appointment    Why is this important?   Part of staying healthy is seeing the doctor for follow-up care.  If you forget your appointments, there are some things you can do to stay on track.    Notes:     . [facility]) Monitor and Manage My Blood Sugar   On track    Timeframe:  Long-Range Goal Priority:  High Start Date:     [id number]                        Expected End Date: [id number]  Follow Up Date 11/19/2020   - check blood  sugar at prescribed times - check blood sugar if I feel it is too high or too low - enter blood sugar readings and medication or insulin into daily log - take the blood sugar log to all doctor visits - take the blood sugar meter to all doctor visits    Why is this important?   Checking your blood sugar at home helps to keep it from getting very high or very low.  Writing the results in a diary or log helps the doctor know how to care for you.  Your blood sugar log should have the time, date and the results.  Also, write down the amount of insulin or other medicine that you take.  Other information, like what you ate, exercise done and how you were feeling, will also be helpful.     Notes:     . [facility]) Obtain Eye Exam   On track    Timeframe:  Long-Range Goal Priority:  Medium Start Date:    [id number]                         Expected End Date:    [id number]                  Follow Up Date [id number]   - keep appointment with eye doctor -Patient will contact eye provider (Dr. Delman Cheadle) and request current eye exam be sent to primary care provider for chart entry     Why is this important?   Eye check-ups are important when you have diabetes.  Vision loss can be prevented.    Notes:  Last eye exam [id number]     . [facility]) Set My Target A1C   On track    Timeframe:  Long-Range Goal Priority:  High Start Date:      [id number]                       Expected End Date:   [id number]                   Follow Up Date [id number]   - set target A1C; -discuss with provider goal Hgb A1C, current is 5.9    Why is this important?   Your target A1C is decided together by you and your doctor.  It is based on several things like your age and other health issues.    Notes:     . [facility]) Track and Manage My Blood Pressure   On track    Timeframe:  Long-Range Goal Priority:  High Start Date:   [id number]                          Expected End Date:   [id number]                   Follow Up Date 11/19/20    -  check blood pressure 3 times per week - choose a place to take my blood pressure (home, clinic or office, retail store) - write blood pressure results in a log or diary    Why is this important?   You won't feel high blood pressure, but it can still hurt your blood vessels.  High blood pressure can cause heart or kidney problems. It can also cause a stroke.  Making lifestyle changes like losing a little  weight or eating less salt will help.  Checking your blood pressure at home and at different times of the day can help to control blood pressure.  If the doctor prescribes medicine remember to take it the way the doctor ordered.  Call the office if you cannot afford the medicine or if there are questions about it.     Notes:  Patient is monitoring blood daily with dx of hypertension       Plan:  Follow-up:  Patient agrees to Care Plan and Follow-up. Provided educational material on healthy eating and portion control Provided a new calendar book for documentation of CBG, BP and weight Patient will follow up with COVID Booster Patient has next eye exam scheduled RN will follow up outreach within the month of April RN sent update assessment to  PCP  [place] [facility] [phone]

## 2020-08-03 NOTE — Telephone Encounter (Signed)
Yes, the patient has kidney slowing by her blood tests but this is not serious and not even close to need for dialysis.  This can be further d/w pt at next visit

## 2020-08-03 NOTE — Telephone Encounter (Signed)
Patient called and said she got a call from [facility] and they mentioned kidney disease. She was requesting a call back at [phone]

## 2020-08-03 NOTE — Patient Instructions (Signed)
Goals Addressed            This Visit's Progress   . [facility]) Learn More About My Health   On track    Timeframe:  Short-Term Goal Priority:  Medium Start Date:   15176160                          Expected End Date: 73710626                     Follow Up Date [id number]   - tell my story and reason for my visit - make a list of questions - ask questions - repeat what I heard to make sure I understand - bring a list of my medicines to the visit - speak up when I don't understand -patient will report successful sleep routine with use of or notify provider of continued difficulties    Why is this important?   The best way to learn about your health and care is by talking to the doctor and nurse.  They will answer your questions and give you information in the way that you like best.    Notes:     . [facility]) Make and Keep All Appointments   On track    Timeframe:  Long-Range Goal Priority:  Medium Start Date:  03500938                           Expected End Date:  18299371                    Follow Up Date [id number]   - ask family or friend for a ride - call to cancel if needed - keep a calendar with appointment dates -schedule primary care follow up appointment    Why is this important?   Part of staying healthy is seeing the doctor for follow-up care.  If you forget your appointments, there are some things you can do to stay on track.    Notes:     . ([facility]) Monitor and Manage My Blood Sugar   On track    Timeframe:  Long-Range Goal Priority:  High Start Date:     01751025                        Expected End Date: 85277824                     Follow Up Date 11/19/2020   - check blood sugar at prescribed times - check blood sugar if I feel it is too high or too low - enter blood sugar readings and medication or insulin into daily log - take the blood sugar log to all doctor visits - take the blood sugar meter to all doctor visits    Why is this important?   Checking your  blood sugar at home helps to keep it from getting very high or very low.  Writing the results in a diary or log helps the doctor know how to care for you.  Your blood sugar log should have the time, date and the results.  Also, write down the amount of insulin or other medicine that you take.  Other information, like what you ate, exercise done and how you were feeling, will also be helpful.     Notes:     . [facility])  Obtain Eye Exam   On track    Timeframe:  Long-Range Goal Priority:  Medium Start Date:    23557322                         Expected End Date:    02542706                  Follow Up Date 23762831   - keep appointment with eye doctor -Patient will contact eye provider (Dr. Delman Cheadle) and request current eye exam be sent to primary care provider for chart entry     Why is this important?   Eye check-ups are important when you have diabetes.  Vision loss can be prevented.    Notes:  Last eye exam [id number]     . [facility]) Set My Target A1C   On track    Timeframe:  Long-Range Goal Priority:  High Start Date:      37106269                       Expected End Date:   48546270                   Follow Up Date 35009381   - set target A1C; -discuss with provider goal Hgb A1C, current is 5.9    Why is this important?   Your target A1C is decided together by you and your doctor.  It is based on several things like your age and other health issues.    Notes:     . [facility]) Track and Manage My Blood Pressure   On track    Timeframe:  Long-Range Goal Priority:  High Start Date:   82993716                          Expected End Date:   96789381                   Follow Up Date 11/19/20    - check blood pressure 3 times per week - choose a place to take my blood pressure (home, clinic or office, retail store) - write blood pressure results in a log or diary    Why is this important?   You won't feel high blood pressure, but it can still hurt your blood vessels.  High blood  pressure can cause heart or kidney problems. It can also cause a stroke.  Making lifestyle changes like losing a little weight or eating less salt will help.  Checking your blood pressure at home and at different times of the day can help to control blood pressure.  If the doctor prescribes medicine remember to take it the way the doctor ordered.  Call the office if you cannot afford the medicine or if there are questions about it.     Notes:  Patient is monitoring blood daily with dx of hypertension

## 2020-08-13 ENCOUNTER — Other Ambulatory Visit: Payer: Self-pay | Admitting: Internal Medicine

## 2020-09-07 ENCOUNTER — Ambulatory Visit: Payer: Medicare Other | Admitting: *Deleted

## 2020-09-13 ENCOUNTER — Other Ambulatory Visit: Payer: Self-pay | Admitting: Internal Medicine

## 2020-09-16 ENCOUNTER — Ambulatory Visit: Payer: Medicare Other

## 2020-09-21 ENCOUNTER — Other Ambulatory Visit: Payer: Self-pay | Admitting: Internal Medicine

## 2020-09-21 NOTE — Telephone Encounter (Signed)
Staff to let pt know  We had to change her losartan hct to micardis hct due to the first med being on backorder  This should work the same or somewhat better

## 2020-09-23 NOTE — Telephone Encounter (Signed)
Patient notified of med change via voicemail

## 2020-09-29 ENCOUNTER — Other Ambulatory Visit: Payer: Self-pay

## 2020-09-29 ENCOUNTER — Ambulatory Visit (INDEPENDENT_AMBULATORY_CARE_PROVIDER_SITE_OTHER): Payer: Medicare Other

## 2020-09-29 VITALS — BP 140/80 | HR 73 | Temp 97.6°F | Ht 63.0 in | Wt 195.8 lb

## 2020-09-29 DIAGNOSIS — Z0000 Encounter for general adult medical examination without abnormal findings: Secondary | ICD-10-CM | POA: Diagnosis not present

## 2020-09-29 NOTE — Patient Instructions (Signed)
Brooke [name] , Thank you for taking time to come for your Medicare Wellness Visit. I appreciate your ongoing commitment to your health goals. Please review the following plan we discussed and let me know if I can assist you in the future.   Screening recommendations/referrals: Colonoscopy: 01/19/2020; completed Mammogram: 06/06/2020 Bone Density: 01/14/2017; completed Recommended yearly ophthalmology/optometry visit for glaucoma screening and checkup Recommended yearly dental visit for hygiene and checkup  Vaccinations: Influenza vaccine: 06/21/2020 Pneumococcal vaccine: 01/10/2017, 01/21/2018 Tdap vaccine: 01/10/2017 Shingles vaccine: never done   Covid-19: 03/18/2020, 04/08/2020  Advanced directives: Documents on file  Conditions/risks identified: Yes; Reviewed health maintenance screenings with patient today and relevant education, vaccines, and/or referrals were provided. Please continue to do your personal lifestyle choices by: daily care of teeth and gums, regular physical activity (goal should be 5 days a week for 30 minutes), eat a healthy diet, avoid tobacco and drug use, limiting any alcohol intake, taking a low-dose aspirin (if not allergic or have been advised by your provider otherwise) and taking vitamins and minerals as recommended by your provider. Continue doing brain stimulating activities (puzzles, reading, adult coloring books, staying active) to keep memory sharp. Continue to eat heart healthy diet (full of fruits, vegetables, whole grains, lean protein, water--limit salt, fat, and sugar intake) and increase physical activity as tolerated.  Next appointment: Please schedule your next Medicare Wellness Visit with your Nurse Health Advisor in 1 year by calling [phone].   Preventive Care 70 Years and Older, Female Preventive care refers to lifestyle choices and visits with your health care provider that can promote health and wellness. What does preventive care include?  A  yearly physical exam. This is also called an annual well check.  Dental exams once or twice a year.  Routine eye exams. Ask your health care provider how often you should have your eyes checked.  Personal lifestyle choices, including:  Daily care of your teeth and gums.  Regular physical activity.  Eating a healthy diet.  Avoiding tobacco and drug use.  Limiting alcohol use.  Practicing safe sex.  Taking low-dose aspirin every day.  Taking vitamin and mineral supplements as recommended by your health care provider. What happens during an annual well check? The services and screenings done by your health care provider during your annual well check will depend on your age, overall health, lifestyle risk factors, and family history of disease. Counseling  Your health care provider may ask you questions about your:  Alcohol use.  Tobacco use.  Drug use.  Emotional well-being.  Home and relationship well-being.  Sexual activity.  Eating habits.  History of falls.  Memory and ability to understand (cognition).  Work and work Statistician.  Reproductive health. Screening  You may have the following tests or measurements:  Height, weight, and BMI.  Blood pressure.  Lipid and cholesterol levels. These may be checked every 5 years, or more frequently if you are over 61 years old.  Skin check.  Lung cancer screening. You may have this screening every year starting at age 7 if you have a 30-pack-year history of smoking and currently smoke or have quit within the past 15 years.  Fecal occult blood test (FOBT) of the stool. You may have this test every year starting at age 13.  Flexible sigmoidoscopy or colonoscopy. You may have a sigmoidoscopy every 5 years or a colonoscopy every 10 years starting at age 33.  Hepatitis C blood test.  Hepatitis B blood test.  Sexually transmitted disease (  STD) testing.  Diabetes screening. This is done by checking your blood  sugar (glucose) after you have not eaten for a while (fasting). You may have this done every 1-3 years.  Bone density scan. This is done to screen for osteoporosis. You may have this done starting at age 4.  Mammogram. This may be done every 1-2 years. Talk to your health care provider about how often you should have regular mammograms. Talk with your health care provider about your test results, treatment options, and if necessary, the need for more tests. Vaccines  Your health care provider may recommend certain vaccines, such as:  Influenza vaccine. This is recommended every year.  Tetanus, diphtheria, and acellular pertussis (Tdap, Td) vaccine. You may need a Td booster every 10 years.  Zoster vaccine. You may need this after age 66.  Pneumococcal 13-valent conjugate (PCV13) vaccine. One dose is recommended after age 74.  Pneumococcal polysaccharide (PPSV23) vaccine. One dose is recommended after age 52. Talk to your health care provider about which screenings and vaccines you need and how often you need them. This information is not intended to replace advice given to you by your health care provider. Make sure you discuss any questions you have with your health care provider. Document Released: 08/05/2015 Document Revised: 03/28/2016 Document Reviewed: 05/10/2015 Elsevier Interactive Patient Education  2017 [place] Prevention in the Home Falls can cause injuries. They can happen to people of all ages. There are many things you can do to make your home safe and to help prevent falls. What can I do on the outside of my home?  Regularly fix the edges of walkways and driveways and fix any cracks.  Remove anything that might make you trip as you walk through a door, such as a raised step or threshold.  Trim any bushes or trees on the path to your home.  Use bright outdoor lighting.  Clear any walking paths of anything that might make someone trip, such as rocks or  tools.  Regularly check to see if handrails are loose or broken. Make sure that both sides of any steps have handrails.  Any raised decks and porches should have guardrails on the edges.  Have any leaves, snow, or ice cleared regularly.  Use sand or salt on walking paths during winter.  Clean up any spills in your garage right away. This includes oil or grease spills. What can I do in the bathroom?  Use night lights.  Install grab bars by the toilet and in the tub and shower. Do not use towel bars as grab bars.  Use non-skid mats or decals in the tub or shower.  If you need to sit down in the shower, use a plastic, non-slip stool.  Keep the floor dry. Clean up any water that spills on the floor as soon as it happens.  Remove soap buildup in the tub or shower regularly.  Attach bath mats securely with double-sided non-slip rug tape.  Do not have throw rugs and other things on the floor that can make you trip. What can I do in the bedroom?  Use night lights.  Make sure that you have a light by your bed that is easy to reach.  Do not use any sheets or blankets that are too big for your bed. They should not hang down onto the floor.  Have a firm chair that has side arms. You can use this for support while you get dressed.  Do  not have throw rugs and other things on the floor that can make you trip. What can I do in the kitchen?  Clean up any spills right away.  Avoid walking on wet floors.  Keep items that you use a lot in easy-to-reach places.  If you need to reach something above you, use a strong step stool that has a grab bar.  Keep electrical cords out of the way.  Do not use floor polish or wax that makes floors slippery. If you must use wax, use non-skid floor wax.  Do not have throw rugs and other things on the floor that can make you trip. What can I do with my stairs?  Do not leave any items on the stairs.  Make sure that there are handrails on both  sides of the stairs and use them. Fix handrails that are broken or loose. Make sure that handrails are as long as the stairways.  Check any carpeting to make sure that it is firmly attached to the stairs. Fix any carpet that is loose or worn.  Avoid having throw rugs at the top or bottom of the stairs. If you do have throw rugs, attach them to the floor with carpet tape.  Make sure that you have a light switch at the top of the stairs and the bottom of the stairs. If you do not have them, ask someone to add them for you. What else can I do to help prevent falls?  Wear shoes that:  Do not have high heels.  Have rubber bottoms.  Are comfortable and fit you well.  Are closed at the toe. Do not wear sandals.  If you use a stepladder:  Make sure that it is fully opened. Do not climb a closed stepladder.  Make sure that both sides of the stepladder are locked into place.  Ask someone to hold it for you, if possible.  Clearly mark and make sure that you can see:  Any grab bars or handrails.  First and last steps.  Where the edge of each step is.  Use tools that help you move around (mobility aids) if they are needed. These include:  Canes.  Walkers.  Scooters.  Crutches.  Turn on the lights when you go into a dark area. Replace any light bulbs as soon as they burn out.  Set up your furniture so you have a clear path. Avoid moving your furniture around.  If any of your floors are uneven, fix them.  If there are any pets around you, be aware of where they are.  Review your medicines with your doctor. Some medicines can make you feel dizzy. This can increase your chance of falling. Ask your doctor what other things that you can do to help prevent falls. This information is not intended to replace advice given to you by your health care provider. Make sure you discuss any questions you have with your health care provider. Document Released: 05/05/2009 Document Revised:  12/15/2015 Document Reviewed: 08/13/2014 Elsevier Interactive Patient Education  2017 [employer].
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
## 2020-10-10 ENCOUNTER — Telehealth: Payer: Self-pay | Admitting: Internal Medicine

## 2020-10-10 DIAGNOSIS — N1831 Chronic kidney disease, stage 3a: Secondary | ICD-10-CM

## 2020-10-10 NOTE — Telephone Encounter (Signed)
Patient requesting referral to Nephrology  to discuss chronic kidney disease

## 2020-10-10 NOTE — Telephone Encounter (Signed)
Ok this is done 

## 2020-10-12 ENCOUNTER — Other Ambulatory Visit: Payer: Self-pay | Admitting: Internal Medicine

## 2020-11-01 ENCOUNTER — Other Ambulatory Visit: Payer: Self-pay | Admitting: *Deleted

## 2020-11-01 NOTE — Patient Outreach (Signed)
[facility]) Care Management  11/01/2020  Brooke [name] 01-02-1951 [id number]   RN Health Coach attempted follow up outreach call to patient.  Patient was unavailable. HIPPA compliance voicemail message left with return callback number.  Plan: RN will call patient again within 30 days.  [place] Care Management [phone]

## 2020-11-08 DIAGNOSIS — I129 Hypertensive chronic kidney disease with stage 1 through stage 4 chronic kidney disease, or unspecified chronic kidney disease: Secondary | ICD-10-CM | POA: Diagnosis not present

## 2020-11-08 DIAGNOSIS — E785 Hyperlipidemia, unspecified: Secondary | ICD-10-CM | POA: Diagnosis not present

## 2020-11-08 DIAGNOSIS — E1122 Type 2 diabetes mellitus with diabetic chronic kidney disease: Secondary | ICD-10-CM | POA: Diagnosis not present

## 2020-11-08 DIAGNOSIS — R809 Proteinuria, unspecified: Secondary | ICD-10-CM | POA: Diagnosis not present

## 2020-11-08 DIAGNOSIS — N1831 Chronic kidney disease, stage 3a: Secondary | ICD-10-CM | POA: Diagnosis not present

## 2020-11-08 DIAGNOSIS — Z791 Long term (current) use of non-steroidal anti-inflammatories (NSAID): Secondary | ICD-10-CM | POA: Diagnosis not present

## 2020-11-08 DIAGNOSIS — N1832 Chronic kidney disease, stage 3b: Secondary | ICD-10-CM | POA: Diagnosis not present

## 2020-11-09 ENCOUNTER — Other Ambulatory Visit: Payer: Self-pay | Admitting: Nephrology

## 2020-11-09 DIAGNOSIS — N1832 Chronic kidney disease, stage 3b: Secondary | ICD-10-CM

## 2020-11-13 ENCOUNTER — Other Ambulatory Visit: Payer: Self-pay | Admitting: Internal Medicine

## 2020-11-16 ENCOUNTER — Other Ambulatory Visit: Payer: Self-pay | Admitting: Internal Medicine

## 2020-11-16 NOTE — Telephone Encounter (Signed)
Ok to let pt know - we have to stop the high dose ibuprofen due to her kidneys slowing down in recent years, so we dont help make it worse

## 2020-11-16 NOTE — Telephone Encounter (Signed)
Patient has been notified

## 2020-11-18 ENCOUNTER — Ambulatory Visit
Admission: RE | Admit: 2020-11-18 | Discharge: 2020-11-18 | Disposition: A | Discharge status: Home / Self Care | Payer: Medicare Other | Source: Ambulatory Visit | Attending: Nephrology | Admitting: Nephrology

## 2020-11-18 DIAGNOSIS — N1832 Chronic kidney disease, stage 3b: Secondary | ICD-10-CM

## 2020-11-18 DIAGNOSIS — N281 Cyst of kidney, acquired: Secondary | ICD-10-CM | POA: Diagnosis not present

## 2020-11-18 DIAGNOSIS — N189 Chronic kidney disease, unspecified: Secondary | ICD-10-CM | POA: Diagnosis not present

## 2020-11-25 ENCOUNTER — Telehealth: Payer: Self-pay | Admitting: Cardiology

## 2020-11-25 MED ORDER — HYDRALAZINE HCL 10 MG PO TABS: 10.0000 mg | ORAL_TABLET | Freq: Three times a day (TID) | ORAL | 6 refills | Status: DC

## 2020-11-25 NOTE — Telephone Encounter (Signed)
*  STAT* If patient is at the pharmacy, call can be transferred to refill team.   1. Which medications need to be refilled? (please list name of each medication and dose if known) hydrALAZINE (APRESOLINE) 10 MG tablet   2. Which pharmacy/location (including street and city if local pharmacy) is medication to be sent to?  [id number] - [place], [place] - [place]  3. Do they need a 30 day or 90 day supply? [place]

## 2020-11-25 NOTE — Telephone Encounter (Signed)
Pt's medication was sent to pt's pharmacy as requested. Confirmation received.  °

## 2020-12-01 ENCOUNTER — Other Ambulatory Visit: Payer: Self-pay

## 2020-12-02 ENCOUNTER — Encounter: Payer: Self-pay | Admitting: Internal Medicine

## 2020-12-02 ENCOUNTER — Ambulatory Visit (INDEPENDENT_AMBULATORY_CARE_PROVIDER_SITE_OTHER): Payer: Medicare Other | Admitting: Internal Medicine

## 2020-12-02 ENCOUNTER — Ambulatory Visit (INDEPENDENT_AMBULATORY_CARE_PROVIDER_SITE_OTHER): Payer: Medicare Other

## 2020-12-02 VITALS — BP 136/74 | HR 74 | Temp 98.3°F | Ht 63.0 in | Wt 182.6 lb

## 2020-12-02 DIAGNOSIS — Z0001 Encounter for general adult medical examination with abnormal findings: Secondary | ICD-10-CM | POA: Diagnosis not present

## 2020-12-02 DIAGNOSIS — J4531 Mild persistent asthma with (acute) exacerbation: Secondary | ICD-10-CM | POA: Diagnosis not present

## 2020-12-02 DIAGNOSIS — R059 Cough, unspecified: Secondary | ICD-10-CM

## 2020-12-02 DIAGNOSIS — N1831 Chronic kidney disease, stage 3a: Secondary | ICD-10-CM | POA: Diagnosis not present

## 2020-12-02 DIAGNOSIS — E538 Deficiency of other specified B group vitamins: Secondary | ICD-10-CM

## 2020-12-02 DIAGNOSIS — E559 Vitamin D deficiency, unspecified: Secondary | ICD-10-CM | POA: Diagnosis not present

## 2020-12-02 DIAGNOSIS — G8929 Other chronic pain: Secondary | ICD-10-CM | POA: Diagnosis not present

## 2020-12-02 DIAGNOSIS — E119 Type 2 diabetes mellitus without complications: Secondary | ICD-10-CM

## 2020-12-02 DIAGNOSIS — M549 Dorsalgia, unspecified: Secondary | ICD-10-CM

## 2020-12-02 LAB — URINALYSIS, ROUTINE W REFLEX MICROSCOPIC
Bilirubin Urine: NEGATIVE
Hgb urine dipstick: NEGATIVE
Ketones, ur: NEGATIVE
Nitrite: NEGATIVE
Specific Gravity, Urine: 1.015 (ref 1.000–1.030)
Total Protein, Urine: NEGATIVE
Urine Glucose: NEGATIVE
Urobilinogen, UA: 0.2 (ref 0.0–1.0)
pH: 6.5 (ref 5.0–8.0)

## 2020-12-02 LAB — LIPID PANEL
Cholesterol: 233 mg/dL — ABNORMAL HIGH (ref 0–200)
HDL: 51.9 mg/dL (ref >39.00)
LDL Cholesterol: 158 mg/dL — ABNORMAL HIGH (ref 0–99)
NonHDL: 180.96
Total CHOL/HDL Ratio: 4
Triglycerides: 117 mg/dL (ref 0.0–149.0)
VLDL: 23.4 mg/dL (ref 0.0–40.0)

## 2020-12-02 LAB — MICROALBUMIN / CREATININE URINE RATIO
Creatinine,U: 110.1 mg/dL
Microalb Creat Ratio: 0.7 mg/g (ref 0.0–30.0)
Microalb, Ur: 0.8 mg/dL (ref 0.0–1.9)

## 2020-12-02 LAB — BASIC METABOLIC PANEL
BUN: 17 mg/dL (ref 6–23)
CO2: 23 mEq/L (ref 19–32)
Calcium: 9.6 mg/dL (ref 8.4–10.5)
Chloride: 109 mEq/L (ref 96–112)
Creatinine, Ser: 1.38 mg/dL — ABNORMAL HIGH (ref 0.40–1.20)
GFR: 39.22 mL/min — ABNORMAL LOW (ref >60.00)
Glucose, Bld: 103 mg/dL — ABNORMAL HIGH (ref 70–99)
Potassium: 3.9 mEq/L (ref 3.5–5.1)
Sodium: 141 mEq/L (ref 135–145)

## 2020-12-02 LAB — HEPATIC FUNCTION PANEL
ALT: 8 U/L (ref 0–35)
AST: 14 U/L (ref 0–37)
Albumin: 3.9 g/dL (ref 3.5–5.2)
Alkaline Phosphatase: 93 U/L (ref 39–117)
Bilirubin, Direct: 0.1 mg/dL (ref 0.0–0.3)
Total Bilirubin: 0.3 mg/dL (ref 0.2–1.2)
Total Protein: 7.2 g/dL (ref 6.0–8.3)

## 2020-12-02 LAB — CBC WITH DIFFERENTIAL/PLATELET
Basophils Absolute: 0.1 10*3/uL (ref 0.0–0.1)
Basophils Relative: 0.7 % (ref 0.0–3.0)
Eosinophils Absolute: 0.1 10*3/uL (ref 0.0–0.7)
Eosinophils Relative: 0.7 % (ref 0.0–5.0)
HCT: 33.2 % — ABNORMAL LOW (ref 36.0–46.0)
Hemoglobin: 10.7 g/dL — ABNORMAL LOW (ref 12.0–15.0)
Lymphocytes Relative: 17 % (ref 12.0–46.0)
Lymphs Abs: 1.3 10*3/uL (ref 0.7–4.0)
MCHC: 32.1 g/dL (ref 30.0–36.0)
MCV: 83.9 fl (ref 78.0–100.0)
Monocytes Absolute: 0.7 10*3/uL (ref 0.1–1.0)
Monocytes Relative: 9.3 % (ref 3.0–12.0)
Neutro Abs: 5.6 10*3/uL (ref 1.4–7.7)
Neutrophils Relative %: 72.3 % (ref 43.0–77.0)
Platelets: 273 10*3/uL (ref 150.0–400.0)
RBC: 3.96 Mil/uL (ref 3.87–5.11)
RDW: 16.5 % — ABNORMAL HIGH (ref 11.5–15.5)
WBC: 7.8 10*3/uL (ref 4.0–10.5)

## 2020-12-02 LAB — TSH: TSH: 2.17 u[IU]/mL (ref 0.35–4.50)

## 2020-12-02 LAB — HEMOGLOBIN A1C: Hgb A1c MFr Bld: 5.9 % (ref 4.6–6.5)

## 2020-12-02 LAB — VITAMIN B12: Vitamin B-12: 245 pg/mL (ref 211–911)

## 2020-12-02 LAB — VITAMIN D 25 HYDROXY (VIT D DEFICIENCY, FRACTURES): VITD: 46.82 ng/mL (ref 30.00–100.00)

## 2020-12-02 MED ORDER — PREGABALIN 50 MG PO CAPS: 50.0000 mg | ORAL_CAPSULE | Freq: Three times a day (TID) | ORAL | 5 refills | Status: DC

## 2020-12-02 MED ORDER — TRULICITY 0.75 MG/0.5ML ~~LOC~~ SOAJ: 0.7500 mg | SUBCUTANEOUS | 3 refills | Status: DC

## 2020-12-02 NOTE — Assessment & Plan Note (Addendum)
Mild, pt not overly interested in change in tx for this she sees as near her [place], declines increased advair, depomedrol or prednisone

## 2020-12-02 NOTE — Progress Notes (Signed)
Patient ID: Brooke [name], female   DOB: 13-Jun-1951, 70 y.o.   MRN: [id number]         Chief Complaint:: wellness exam and Cough (Cough x4 months)  , chronic lbp and DM       HPI:  Brooke [name] is a 70 y.o. female here for wellness exam; has eye appt next wk; o/w up to date with preventive referrals and immunizations.                        Also states has not been able to tolerate the metformin due to GI upset so not really taking, and would like to try for wt loss if possible such as with trulicity.  Pt continues to have recurring LBP without change in severity, bowel or bladder change, fever, wt loss,  worsening LE pain/numbness/weakness, gait change or falls, but also has CKD and no longer candidate for nsaid use.  Pt denies chest pain, increased sob or doe, wheezing, orthopnea, PND, increased LE swelling, palpitations, dizziness or syncope, but has ongoing cough for several months - ? Allergy related vs other,  Denies worsening reflux, abd pain, dysphagia, n/v, bowel change or blood.   Pt denies fever, wt loss, night sweats, loss of appetite, or other constitutional symptoms     Wt Readings from Last 3 Encounters:  12/02/20 182 lb 9.6 oz (82.8 kg)  09/29/20 195 lb 12.8 oz (88.8 kg)  06/21/20 186 lb (84.4 kg)   BP Readings from Last 3 Encounters:  12/02/20 136/74  09/29/20 140/80  06/21/20 140/70   Immunization History  Administered Date(s) Administered  . Fluad Quad(high Dose 65+) 06/21/2020  . Influenza Split 04/18/2011, 04/03/2012  . Influenza Whole 04/21/2008, 05/03/2009, 04/10/2010  . Influenza, High Dose Seasonal PF 07/19/2017, 04/08/2018  . Influenza,inj,Quad PF,6+ Mos 04/02/2013, 04/23/2014, 05/11/2015, 09/12/2016  . PFIZER(Purple Top)SARS-COV-2 Vaccination 03/18/2020, 04/08/2020  . Pneumococcal Conjugate-13 01/10/2017  . Pneumococcal Polysaccharide-23 09/05/2009, 01/21/2018  . Td 03/23/2004  . Tdap 01/10/2017  . Zoster 08/18/2012  There are no preventive care reminders  to display for this patient.    Past Medical History:  Diagnosis Date  . Alopecia   . Anxiety   . Aortic atherosclerosis ([place])   . Asthma    FOLLOWED BY PCP  . Eczema   . GERD (gastroesophageal reflux disease)   . Gout    04-28-2018--- per pt stable , as been a while since last episode  . Heart murmur   . History of colon polyps   . History of syncope 2015  . Hyperlipidemia   . Hypertension   . Hypothyroidism   . OA (osteoarthritis) of knee    bilateral  . PVC's (premature ventricular contractions)   . Toxic multinodular goiter    03/ 2003  s/p RAI  . Type 2 diabetes mellitus ([place])    followed by pcp  . Ventricular tachycardia, polymorphic ([place]) 01/04/2014   primary cardiologist-- dr Tressia Miners turner (hx monitor 2015 showed couplet PVCs, as trigger)  . Wears glasses   . Wears partial dentures    upper   Past Surgical History:  Procedure Laterality Date  . ABDOMINAL HYSTERECTOMY  10/22/1999   WITH BSO  . CATARACT EXTRACTION W/ INTRAOCULAR LENS IMPLANT Left YRS AGO  . COLONOSCOPY    . EXCISION ABDOMINAL WALL MASS  12-20-2005   dr Margot Chimes @[facility]    neruofibroma  . LAPAROSCOPIC CHOLECYSTECTOMY  10/01/2002   dr Margot Chimes @[facility]   . LEFT HEART CATHETERIZATION  WITH CORONARY ANGIOGRAM N/A 01/07/2014   Procedure: LEFT HEART CATHETERIZATION WITH CORONARY ANGIOGRAM;  Surgeon: Peter M Martinique, MD;  Location: [facility];  Service: Cardiovascular;  Laterality: N/A;  . RASTELLI PROCEDURE  6/98 neg  . RENAL ARTERY STENT Left 11/2003   angioplasty and stenting  . RENAL ARTERY STENT Left 02/2005   re-stenting  . TOTAL KNEE ARTHROPLASTY Right 05/05/2018   Procedure: RIGHT TOTAL KNEE ARTHROPLASTY;  Surgeon: Frederik Pear, MD;  Location: [facility];  Service: Orthopedics;  Laterality: Right;  . TOTAL KNEE ARTHROPLASTY Left 09/30/2018   Procedure: TOTAL KNEE ARTHROPLASTY;  Surgeon: Melrose Nakayama, MD;  Location: [facility];  Service: Orthopedics;  Laterality: Left;    reports that she has never smoked. She  has never used smokeless tobacco. She reports that she does not drink alcohol and does not use drugs. family history includes Diabetes in her mother; Heart disease in her father. Allergies  Allergen Reactions  . Metformin And Related Nausea And Vomiting  . Ace Inhibitors Swelling       . Codeine Nausea And Vomiting and Rash  . Hydrocodone Nausea And Vomiting  . Tramadol Nausea And Vomiting  . Tylenol [Acetaminophen] Itching and Rash   Current Outpatient Medications on File Prior to Visit  Medication Sig Dispense Refill  . albuterol (VENTOLIN HFA) 108 (90 Base) MCG/ACT inhaler INHALE 2 PUFFS BY MOUTH EVERY 6 HOURS AS NEEDED FOR WHEEZE 25.5 each 2  . allopurinol (ZYLOPRIM) 100 MG tablet TAKE 1 TABLET BY MOUTH EVERY DAY 90 tablet 3  . AMBULATORY NON FORMULARY MEDICATION Medication Name: Rolling walker 1 Device 0  . amLODipine (NORVASC) 10 MG tablet TAKE 1 TABLET BY MOUTH EVERY DAY 90 tablet 2  . citalopram (CELEXA) 40 MG tablet TAKE 1 TABLET BY MOUTH EVERY DAY 90 tablet 2  . clonazePAM (KLONOPIN) 0.5 MG tablet TAKE 1 TABLET (0.5 MG TOTAL) BY MOUTH 2 (TWO) TIMES DAILY AS NEEDED (VERTIGO). 30 tablet 2  . fexofenadine (ALLEGRA) 180 MG tablet TAKE 1 TABLET BY MOUTH EVERY DAY 90 tablet 3  . Fluticasone-Salmeterol (WIXELA INHUB) 250-50 MCG/DOSE AEPB Inhale 1 puff into the lungs 2 (two) times daily. 60 each 11  . hydrALAZINE (APRESOLINE) 10 MG tablet Take 1 tablet (10 mg total) by mouth 3 (three) times daily. 90 tablet 6  . KLOR-CON M20 20 MEQ tablet TAKE 1 TABLET BY MOUTH TWICE A DAY 180 tablet 1  . meclizine (ANTIVERT) 25 MG tablet TAKE 1 TABLET BY MOUTH 3 TIMES DAILY AS NEEDED FOR DIZZINESS. 30 tablet 5  . metoprolol succinate (TOPROL-XL) 100 MG 24 hr tablet TAKE 1 TABLET BY MOUTH TWICE A DAY 180 tablet 3  . omeprazole (PRILOSEC) 20 MG capsule TAKE 1 CAPSULE BY MOUTH TWICE A DAY 180 capsule 3  . rosuvastatin (CRESTOR) 20 MG tablet TAKE 1 TABLET (20 MG TOTAL) BY MOUTH DAILY. ANNUAL APPT DUE IN  JULY 90 tablet 0  . telmisartan-hydrochlorothiazide (MICARDIS HCT) 80-25 MG tablet Take 1 tablet by mouth daily. 90 tablet 2  . traZODone (DESYREL) 100 MG tablet TAKE 1 TABLET BY MOUTH AT BEDTIME AS NEEDED FOR SLEEP. 90 tablet 1  . triamcinolone (NASACORT) 55 MCG/ACT AERO nasal inhaler PLACE 2 SPRAYS INTO THE NOSE DAILY. 16.9 each 12   No current facility-administered medications on file prior to visit.        ROS:  All others reviewed and negative.  Objective        PE:  BP 136/74 (BP Location: Left Arm, Patient  Position: Sitting, Cuff Size: Large)   Pulse 74   Temp 98.3 F (36.8 C) (Oral)   Ht 5\' 3"  (1.6 m)   Wt 182 lb 9.6 oz (82.8 kg)   SpO2 96%   BMI 32.35 kg/m                 Constitutional: Pt appears in NAD               HENT: Head: NCAT.                Right Ear: External ear normal.                 Left Ear: External ear normal.                Eyes: . Pupils are equal, round, and reactive to light. Conjunctivae and EOM are normal               Nose: without d/c or deformity               Neck: Neck supple. Gross normal ROM               Cardiovascular: Normal rate and regular rhythm.                 Pulmonary/Chest: Effort normal and breath sounds without rales but with few bilat wheezing.                Abd:  Soft, NT, ND, + BS, no organomegaly               Neurological: Pt is alert. At baseline orientation, motor grossly intact               Skin: Skin is warm. No rashes, no other new lesions, LE edema - none               Psychiatric: Pt behavior is normal without agitation   Micro: none  Cardiac tracings I have personally interpreted today:  none  Pertinent Radiological findings (summarize): none   Lab Results  Component Value Date   WBC 7.8 12/02/2020   HGB 10.7 (L) 12/02/2020   HCT 33.2 (L) 12/02/2020   PLT 273.0 12/02/2020   GLUCOSE 103 (H) 12/02/2020   CHOL 233 (H) 12/02/2020   TRIG 117.0 12/02/2020   HDL 51.90 12/02/2020   LDLDIRECT 139.1  09/05/2009   LDLCALC 158 (H) 12/02/2020   ALT 8 12/02/2020   AST 14 12/02/2020   NA 141 12/02/2020   K 3.9 12/02/2020   CL 109 12/02/2020   CREATININE 1.38 (H) 12/02/2020   BUN 17 12/02/2020   CO2 23 12/02/2020   TSH 2.17 12/02/2020   INR 1.0 09/24/2018   HGBA1C 5.9 12/02/2020   MICROALBUR 0.8 12/02/2020   Assessment/Plan:  Brooke [name] is a 70 y.o. Black or African American [2] female with  has a past medical history of Alopecia, Anxiety, Aortic atherosclerosis ([place]), Asthma, Eczema, GERD (gastroesophageal reflux disease), Gout, Heart murmur, History of colon polyps, History of syncope (2015), Hyperlipidemia, Hypertension, Hypothyroidism, OA (osteoarthritis) of knee, PVC's (premature ventricular contractions), Toxic multinodular goiter, Type 2 diabetes mellitus ([place]), Ventricular tachycardia, polymorphic ([place]) (01/04/2014), Wears glasses, and Wears partial dentures.  Asthma exacerbation Mild, pt not overly interested in change in tx for this she sees as near her baseine, declines increased advair, depomedrol or prednisone  Encounter for well adult exam with abnormal findings Age and sex appropriate education and counseling  updated with regular exercise and diet Referrals for preventative services - has eye exam soon Immunizations addressed - none needed Smoking counseling  - none needed Evidence for depression or other mood disorder - none significant Most recent labs reviewed. I have personally reviewed and have noted: 1) the patient's medical and social history 2) The patient's current medications and supplements 3) The patient's height, weight, and BMI have been recorded in the chart   Cough etiolgy unclear, also for cxr r/o other  Chronic back pain With persisetent at least mild to mod pain, no longer able for nsaid prn due to ckd, for trial lyrica 50 tid  Vitamin D deficiency Last vitamin D Lab Results  Component Value Date   VD25OH 46.82 12/02/2020   Stable,  cont oral replacement   Diabetes Lab Results  Component Value Date   HGBA1C 5.9 12/02/2020   Stable, pt to continue current medical treatment except to d/c metformin and start trulicity asd,  Cont dm diet   CKD (chronic kidney disease) stage 3, GFR 30-59 ml/min (H[facility]) Lab Results  Component Value Date   CREATININE 1.38 (H) 12/02/2020   Stable overall, cont to avoid nephrotoxins  Followup: Return in about 6 months (around 06/04/2021).  [name], MD 12/08/2020 6:23 AM [facility]

## 2020-12-02 NOTE — Patient Instructions (Signed)
Ok to stop the metformin  Please take all new medication as prescribed - the trulicity for sugar, and the lyrica for back pain  Please continue all other medications as before, and refills have been done if requested.  Please have the pharmacy call with any other refills you may need.  Please continue your efforts at being more active, low cholesterol diet, and weight control.  You are otherwise up to date with prevention measures today.  Please keep your appointments with your specialists as you may have planned  Please go to the XRAY Department in the first floor for the x-ray testing  Please go to the LAB at the blood drawing area for the tests to be done  You will be contacted by phone if any changes need to be made immediately.  Otherwise, you will receive a letter about your results with an explanation, but please check with MyChart first.  Please remember to sign up for MyChart if you have not done so, as this will be important to you in the future with finding out test results, communicating by private email, and scheduling acute appointments online when needed.  Please make an Appointment to return in 6 months, or sooner if needed

## 2020-12-04 ENCOUNTER — Encounter: Payer: Self-pay | Admitting: Internal Medicine

## 2020-12-05 ENCOUNTER — Telehealth: Payer: Self-pay | Admitting: Internal Medicine

## 2020-12-05 NOTE — Telephone Encounter (Signed)
Please ask pt to ask at the pharmacy about a medication that is in the same family as trulicity but IS covered with her insurance and either she or the pharmacy to let us know, bc there are several simialr meds to trulicty and any other medication I send would be just guessing again it is if covered

## 2020-12-05 NOTE — Telephone Encounter (Signed)
Patient called and said that Dulaglutide (TRULICITY) 0.35 KK/9.3GH SOPN is $200. She said that she cannot afford that and was wondering if anything else could be called in. Please advise

## 2020-12-06 NOTE — Telephone Encounter (Signed)
Patient notified to contact pharmacy to see what is covered in place of her trulicity.Marland Kitchen

## 2020-12-08 ENCOUNTER — Encounter: Payer: Self-pay | Admitting: Internal Medicine

## 2020-12-08 DIAGNOSIS — H52201 Unspecified astigmatism, right eye: Secondary | ICD-10-CM | POA: Diagnosis not present

## 2020-12-08 DIAGNOSIS — H31002 Unspecified chorioretinal scars, left eye: Secondary | ICD-10-CM | POA: Diagnosis not present

## 2020-12-08 DIAGNOSIS — E119 Type 2 diabetes mellitus without complications: Secondary | ICD-10-CM | POA: Diagnosis not present

## 2020-12-08 DIAGNOSIS — H4720 Unspecified optic atrophy: Secondary | ICD-10-CM | POA: Diagnosis not present

## 2020-12-08 LAB — HM DIABETES EYE EXAM

## 2020-12-08 NOTE — Assessment & Plan Note (Signed)
Lab Results  Component Value Date   CREATININE 1.38 (H) 12/02/2020   Stable overall, cont to avoid nephrotoxins

## 2020-12-08 NOTE — Assessment & Plan Note (Signed)
Lab Results  Component Value Date   HGBA1C 5.9 12/02/2020   Stable, pt to continue current medical treatment except to d/c metformin and start trulicity asd,  Cont dm diet

## 2020-12-08 NOTE — Assessment & Plan Note (Signed)
With persisetent at least mild to mod pain, no longer able for nsaid prn due to ckd, for trial lyrica 50 tid

## 2020-12-08 NOTE — Assessment & Plan Note (Signed)
Age and sex appropriate education and counseling updated with regular exercise and diet Referrals for preventative services - has eye exam soon Immunizations addressed - none needed Smoking counseling  - none needed Evidence for depression or other mood disorder - none significant Most recent labs reviewed. I have personally reviewed and have noted: 1) the patient's medical and social history 2) The patient's current medications and supplements 3) The patient's height, weight, and BMI have been recorded in the chart

## 2020-12-08 NOTE — Assessment & Plan Note (Signed)
Last vitamin D Lab Results  Component Value Date   VD25OH 46.82 12/02/2020   Stable, cont oral replacement

## 2020-12-08 NOTE — Assessment & Plan Note (Addendum)
etiolgy unclear, also for cxr r/o other

## 2020-12-09 ENCOUNTER — Encounter: Payer: Self-pay | Admitting: Internal Medicine

## 2020-12-12 ENCOUNTER — Other Ambulatory Visit: Payer: Self-pay | Admitting: *Deleted

## 2020-12-13 ENCOUNTER — Encounter: Payer: Self-pay | Admitting: *Deleted

## 2020-12-14 NOTE — Patient Instructions (Signed)
Goals Addressed            This Visit's Progress   . [facility]) Learn More About My Health   On track    Timeframe:  Short-Term Goal Priority:  Medium Start Date:   [id number]                          Expected End Date: [id number]                     Follow Up Date [id number]   - tell my story and reason for my visit - make a list of questions - ask questions - repeat what I heard to make sure I understand - bring a list of my medicines to the visit - speak up when I don't understand -patient will report successful sleep routine with use of or notify provider of continued difficulties    Why is this important?   The best way to learn about your health and care is by talking to the doctor and nurse.  They will answer your questions and give you information in the way that you like best.    Notes:  [id number] Patient is learning about how to reduce her cholesterol level.  RN Health Coach sent educational material    . [facility]) Make and Keep All Appointments   On track    Timeframe:  Long-Range Goal Priority:  Medium Start Date:  [id number]                           Expected End Date:  37902409                    Follow Up Date [id number]   - ask family or friend for a ride - call to cancel if needed - keep a calendar with appointment dates -schedule primary care follow up appointment    Why is this important?   Part of staying healthy is seeing the doctor for follow-up care.  If you forget your appointments, there are some things you can do to stay on track.    Notes:  [id number] Patient is keeping appointments    . [facility]) Monitor and Manage My Blood Sugar   On track    Timeframe:  Long-Range Goal Priority:  High Start Date:     [id number]                        Expected End Date: [id number]                     Follow Up Date 03/21/2021   - check blood sugar at prescribed times - check blood sugar if I feel it is too high or too low - enter blood sugar readings and medication or insulin  into daily log - take the blood sugar log to all doctor visits - take the blood sugar meter to all doctor visits    Why is this important?   Checking your blood sugar at home helps to keep it from getting very high or very low.  Writing the results in a diary or log helps the doctor know how to care for you.  Your blood sugar log should have the time, date and the results.  Also, write down the amount of insulin or other medicine that you take.  Other information, like what you  ate, exercise done and how you were feeling, will also be helpful.     Notes:  [id number] Patient is monitoring blood sugars    . [facility]) Obtain Eye Exam   On track    Timeframe:  Long-Range Goal Priority:  Medium Start Date:    31517616                         Expected End Date:    07371062                  Follow Up Date [id number]   - keep appointment with eye doctor -Patient will contact eye provider (Dr. Delman Cheadle) and request current eye exam be sent to primary care provider for chart entry     Why is this important?   Eye check-ups are important when you have diabetes.  Vision loss can be prevented.    Notes:  Last eye exam [id number]     . [facility]) Set My Target A1C   On track    Timeframe:  Long-Range Goal Priority:  High Start Date:      81829937                       Expected End Date:   16967893                   Follow Up Date 81017510   - set target A1C; -discuss with provider goal Hgb A1C, current is 5.9    Why is this important?   Your target A1C is decided together by you and your doctor.  It is based on several things like your age and other health issues.    Notes:  [id number] A1C is 5.9    . [facility]) Track and Manage My Blood Pressure   On track    Timeframe:  Long-Range Goal Priority:  High Start Date:   24235361                          Expected End Date:   44315400                   Follow Up Date 03/21/21    - check blood pressure 3 times per week - choose a place to take my blood  pressure (home, clinic or office, retail store) - write blood pressure results in a log or diary    Why is this important?   You won't feel high blood pressure, but it can still hurt your blood vessels.  High blood pressure can cause heart or kidney problems. It can also cause a stroke.  Making lifestyle changes like losing a little weight or eating less salt will help.  Checking your blood pressure at home and at different times of the day can help to control blood pressure.  If the doctor prescribes medicine remember to take it the way the doctor ordered.  Call the office if you cannot afford the medicine or if there are questions about it.     Notes:  Patient is monitoring blood daily with dx of hypertension [id number] Patient is monitoring her blood pressure

## 2020-12-17 ENCOUNTER — Other Ambulatory Visit: Payer: Self-pay | Admitting: Internal Medicine

## 2020-12-17 NOTE — Telephone Encounter (Signed)
Please refill as per office routine med refill policy (all routine meds refilled for 3 mo or monthly per pt preference up to one year from last visit, then month to month grace period for 3 mo, then further med refills will have to be denied)  

## 2020-12-20 ENCOUNTER — Ambulatory Visit: Payer: Medicare Other | Admitting: Internal Medicine

## 2020-12-20 MED ORDER — FLUTICASONE-SALMETEROL 250-50 MCG/ACT IN AEPB: INHALATION_SPRAY | RESPIRATORY_TRACT | 11 refills | Status: DC

## 2020-12-20 NOTE — Patient Outreach (Signed)
[facility]) Care Management  [place]  12/20/2020   Brooke [name] 04/15/1951 [id number]  RN Health Coach telephone call to patient.Hipaa compliance verified   Patient A1C is 5.9.  Patient has not been able to exercise. Per patient she has be caring for her husband. Hospice has started coming to take care of husband. Per patient her cholesterol levels is elevated. Patient has agreed to further outreach calls.   Encounter Medications:  Outpatient Encounter Medications as of 12/12/2020  Medication Sig Note  . albuterol (VENTOLIN HFA) 108 (90 Base) MCG/ACT inhaler INHALE 2 PUFFS BY MOUTH EVERY 6 HOURS AS NEEDED FOR WHEEZE   . allopurinol (ZYLOPRIM) 100 MG tablet TAKE 1 TABLET BY MOUTH EVERY DAY   . AMBULATORY NON FORMULARY MEDICATION Medication Name: Rolling walker   . amLODipine (NORVASC) 10 MG tablet TAKE 1 TABLET BY MOUTH EVERY DAY   . citalopram (CELEXA) 40 MG tablet TAKE 1 TABLET BY MOUTH EVERY DAY   . clonazePAM (KLONOPIN) 0.5 MG tablet TAKE 1 TABLET (0.5 MG TOTAL) BY MOUTH 2 (TWO) TIMES DAILY AS NEEDED (VERTIGO). 05/25/2020: Reports having not taken medication in a while  . Dulaglutide (TRULICITY) 4.94 WH/6.7RF SOPN Inject 0.75 mg into the skin once a week.   . fexofenadine (ALLEGRA) 180 MG tablet TAKE 1 TABLET BY MOUTH EVERY DAY   . hydrALAZINE (APRESOLINE) 10 MG tablet Take 1 tablet (10 mg total) by mouth 3 (three) times daily.   Marland Kitchen KLOR-CON M20 20 MEQ tablet TAKE 1 TABLET BY MOUTH TWICE A DAY   . meclizine (ANTIVERT) 25 MG tablet TAKE 1 TABLET BY MOUTH 3 TIMES DAILY AS NEEDED FOR DIZZINESS. 05/25/2020: Reports not having taken in a while  . metoprolol succinate (TOPROL-XL) 100 MG 24 hr tablet TAKE 1 TABLET BY MOUTH TWICE A DAY   . omeprazole (PRILOSEC) 20 MG capsule TAKE 1 CAPSULE BY MOUTH TWICE A DAY   . pregabalin (LYRICA) 50 MG capsule Take 1 capsule (50 mg total) by mouth 3 (three) times daily.   . rosuvastatin (CRESTOR) 20 MG tablet TAKE 1 TABLET (20 MG  TOTAL) BY MOUTH DAILY. ANNUAL APPT DUE IN [place]   . telmisartan-hydrochlorothiazide (MICARDIS HCT) 80-25 MG tablet Take 1 tablet by mouth daily.   . traZODone (DESYREL) 100 MG tablet TAKE 1 TABLET BY MOUTH AT BEDTIME AS NEEDED FOR SLEEP.   Marland Kitchen triamcinolone (NASACORT) 55 MCG/ACT AERO nasal inhaler PLACE 2 SPRAYS INTO THE NOSE DAILY.   . [DISCONTINUED] Fluticasone-Salmeterol (WIXELA INHUB) 250-50 MCG/DOSE AEPB Inhale 1 puff into the lungs 2 (two) times daily.    No facility-administered encounter medications on file as of 12/12/2020.    Functional Status:  In your present state of health, do you have any difficulty performing the following activities: 09/29/2020 06/21/2020  Hearing? N N  Vision? N N  Difficulty concentrating or making decisions? N N  Walking or climbing stairs? N N  Dressing or bathing? N N  Doing errands, shopping? N N  Preparing Food and eating ? N -  Using the Toilet? N -  In the past six months, have you accidently leaked urine? N -  Do you have problems with loss of bowel control? N -  Managing your Medications? N -  Managing your Finances? N -  Housekeeping or managing your Housekeeping? N -  Some recent data might be hidden    Fall/Depression Screening: Fall Risk  12/12/2020 12/02/2020 09/29/2020  Falls in the past year? 0 0 0  Number falls in past yr: 0 0 0  Injury with Fall? 0 0 0  Risk Factor Category  - - -  Risk for fall due to : Impaired balance/gait;Impaired mobility - No Fall Risks  Follow up Falls evaluation completed - Falls evaluation completed   PHQ 2/9 Scores 12/02/2020 09/29/2020 08/03/2020 06/21/2020 03/04/2020 12/01/2019 09/11/2019  PHQ - 2 Score 1 1 0 0 3 4 3   PHQ- 9 Score - - - 0 7 6 13     Assessment:  Goals Addressed            This Visit's Progress   . [facility]) Learn More About My Health   On track    Timeframe:  Short-Term Goal Priority:  Medium Start Date:   24580998                          Expected End Date: 33825053                      Follow Up Date [id number]   - tell my story and reason for my visit - make a list of questions - ask questions - repeat what I heard to make sure I understand - bring a list of my medicines to the visit - speak up when I don't understand -patient will report successful sleep routine with use of or notify provider of continued difficulties    Why is this important?   The best way to learn about your health and care is by talking to the doctor and nurse.  They will answer your questions and give you information in the way that you like best.    Notes:  [id number] Patient is learning about how to reduce her cholesterol level.  RN Health Coach sent educational material    . [facility]) Make and Keep All Appointments   On track    Timeframe:  Long-Range Goal Priority:  Medium Start Date:  35329924                           Expected End Date:  26834196                    Follow Up Date 22297989   - ask family or friend for a ride - call to cancel if needed - keep a calendar with appointment dates -schedule primary care follow up appointment    Why is this important?   Part of staying healthy is seeing the doctor for follow-up care.  If you forget your appointments, there are some things you can do to stay on track.    Notes:  [id number] Patient is keeping appointments    . [facility]) Monitor and Manage My Blood Sugar   On track    Timeframe:  Long-Range Goal Priority:  High Start Date:     08144818                        Expected End Date: 56314970                     Follow Up Date 03/21/2021   - check blood sugar at prescribed times - check blood sugar if I feel it is too high or too low - enter blood sugar readings and medication or insulin into daily log - take the blood sugar log to all doctor visits -  take the blood sugar meter to all doctor visits    Why is this important?   Checking your blood sugar at home helps to keep it from getting very high or very low.  Writing the results  in a diary or log helps the doctor know how to care for you.  Your blood sugar log should have the time, date and the results.  Also, write down the amount of insulin or other medicine that you take.  Other information, like what you ate, exercise done and how you were feeling, will also be helpful.     Notes:  [id number] Patient is monitoring blood sugars    . [facility]) Obtain Eye Exam   On track    Timeframe:  Long-Range Goal Priority:  Medium Start Date:    14970263                         Expected End Date:    78588502                  Follow Up Date 77412878   - keep appointment with eye doctor -Patient will contact eye provider (Dr. Delman Cheadle) and request current eye exam be sent to primary care provider for chart entry     Why is this important?   Eye check-ups are important when you have diabetes.  Vision loss can be prevented.    Notes:  Last eye exam [id number]     . [facility]) Set My Target A1C   On track    Timeframe:  Long-Range Goal Priority:  High Start Date:      70962836                       Expected End Date:   62947654                   Follow Up Date 65035465   - set target A1C; -discuss with provider goal Hgb A1C, current is 5.9    Why is this important?   Your target A1C is decided together by you and your doctor.  It is based on several things like your age and other health issues.    Notes:  [id number] A1C is 5.9    . [facility]) Track and Manage My Blood Pressure   On track    Timeframe:  Long-Range Goal Priority:  High Start Date:   00174944                          Expected End Date:   96759163                   Follow Up Date 03/21/21    - check blood pressure 3 times per week - choose a place to take my blood pressure (home, clinic or office, retail store) - write blood pressure results in a log or diary    Why is this important?   You won't feel high blood pressure, but it can still hurt your blood vessels.  High blood pressure can cause heart or kidney  problems. It can also cause a stroke.  Making lifestyle changes like losing a little weight or eating less salt will help.  Checking your blood pressure at home and at different times of the day can help to control blood pressure.  If the doctor prescribes medicine remember to take it the way the  doctor ordered.  Call the office if you cannot afford the medicine or if there are questions about it.     Notes:  Patient is monitoring blood daily with dx of hypertension [id number] Patient is monitoring her blood pressure        Plan:  Follow-up:  Patient agrees to Care Plan and Follow-up. RN sent educational material on Cholesterol Patient will continue to try to maintain A1C 5.9 or less RN sent update assessment to PCP RN will follow up within the month of August  Mahira Petras [place] Care Management [phone]

## 2020-12-20 NOTE — Addendum Note (Signed)
Addended by: Biagio Borg on: 12/20/2020 09:52 AM   Modules accepted: Orders

## 2020-12-21 ENCOUNTER — Other Ambulatory Visit: Payer: Self-pay | Admitting: *Deleted

## 2020-12-21 NOTE — Patient Outreach (Signed)
[facility]) Care Management  12/21/2020  Brooke [name] Jul 13, 1951 [id number]  Patient is being transferred to the Chronic Care Management program with provider office.   [place] Care Management [phone]

## 2020-12-23 ENCOUNTER — Other Ambulatory Visit: Payer: Self-pay | Admitting: Internal Medicine

## 2020-12-23 NOTE — Telephone Encounter (Signed)
Please refill as per office routine med refill policy (all routine meds refilled for 3 mo or monthly per pt preference up to one year from last visit, then month to month grace period for 3 mo, then further med refills will have to be denied)  

## 2020-12-26 ENCOUNTER — Telehealth: Payer: Self-pay | Admitting: *Deleted

## 2020-12-26 NOTE — Chronic Care Management (AMB) (Signed)
  Chronic Care Management   Note  12/26/2020 Name: DOLORA RIDGELY MRN: [id number] DOB: [birth year]  Shawnetta Lein [place] is a 70 y.o. year old female who is a primary care patient of Biagio Borg, MD. I reached out to [employer] by phone today in response to a referral sent by Ms. Oakdale E Gallop's PCP, Dr. Jenny Reichmann.     Ms. Parran was given information about Chronic Care Management services today including:  1. CCM service includes personalized support from designated clinical staff supervised by her physician, including individualized plan of care and coordination with other care providers 2. 24/7 contact phone numbers for assistance for urgent and routine care needs. 3. Service will only be billed when office clinical staff spend 20 minutes or more in a month to coordinate care. 4. Only one practitioner may furnish and bill the service in a calendar month. 5. The patient may stop CCM services at any time (effective at the end of the month) by phone call to the office staff. 6. The patient will be responsible for cost sharing (co-pay) of up to 20% of the service fee (after annual deductible is met).  Patient agreed to services and verbal consent obtained.   Follow up plan: Telephone appointment with care management team member scheduled for:01/25/2021  [facility]

## 2021-01-20 ENCOUNTER — Telehealth: Payer: Self-pay | Admitting: Internal Medicine

## 2021-01-20 NOTE — Telephone Encounter (Signed)
Type of form received: Patient assist for Trulicity     Additional comments:   Received by: Mail  Form should be Faxed to: [phone]  Form should be mailed to:  N/A  Is patient requesting call for pickup: N   Form placed in the Provider's box.  *Attach charge sheet.  Provider will determine charge.*  Was patient informed of  7-10 business day turn around (Y/N)? N

## 2021-01-25 ENCOUNTER — Telehealth: Payer: Self-pay | Admitting: *Deleted

## 2021-01-25 ENCOUNTER — Telehealth: Payer: Medicare Other

## 2021-01-25 NOTE — Telephone Encounter (Signed)
  Chronic Care Management   Follow Up Note   01/25/2021 Name: Brooke [name] MRN: [id number] DOB: Oct 18, 1950   Referred by: Biagio Borg, MD Reason for referral : Chronic Care Management (CCM RN CM Initial Telephone Outreach, Unsuccessful attempt)  An unsuccessful telephone outreach was attempted today. The patient was referred to the case management team for assistance with care management and care coordination.   Follow Up Plan:  A HIPPA compliant phone message was left for the patient providing contact information and requesting a return call Patient did not return call within hour; will request scheduling care guide to contact patient to re-schedule today's missed appointment for initial assessment  Oneta Rack, RN, BSN, [place] [phone]: direct office [phone]: mobile

## 2021-01-27 NOTE — Telephone Encounter (Signed)
Forms have been faxed 

## 2021-01-30 NOTE — Telephone Encounter (Signed)
   [place] requesting page 4 of application for assistance  be completed for  Trulicity.  Please contact patient

## 2021-01-31 ENCOUNTER — Other Ambulatory Visit: Payer: Self-pay | Admitting: Internal Medicine

## 2021-01-31 NOTE — Telephone Encounter (Signed)
Please refill as per office routine med refill policy (all routine meds refilled for 3 mo or monthly per pt preference up to one year from last visit, then month to month grace period for 3 mo, then further med refills will have to be denied)  

## 2021-02-01 ENCOUNTER — Ambulatory Visit (INDEPENDENT_AMBULATORY_CARE_PROVIDER_SITE_OTHER): Payer: Medicare Other | Admitting: *Deleted

## 2021-02-01 DIAGNOSIS — I10 Essential (primary) hypertension: Secondary | ICD-10-CM | POA: Diagnosis not present

## 2021-02-01 DIAGNOSIS — E119 Type 2 diabetes mellitus without complications: Secondary | ICD-10-CM | POA: Diagnosis not present

## 2021-02-02 MED ORDER — OXYBUTYNIN CHLORIDE 5 MG PO TABS: 5.0000 mg | ORAL_TABLET | Freq: Every day | ORAL | 3 refills | Status: DC

## 2021-02-02 NOTE — Addendum Note (Signed)
Addended by: Biagio Borg on: 02/02/2021 09:44 AM   Modules accepted: Orders

## 2021-02-02 NOTE — Patient Instructions (Signed)
Visit Brooke [name], it was nice talking with you today.   Please read over the attached information, and keep up the great work checking your blood sugars and blood pressures at home- we will review these during each of our phone call appointments.  I have asked the Care Management Pharmacy team to contact you to follow up on your application for the Patient Assistance Program for Trulicity and to call you to go over your medications with you.    I have made Dr. Jenny Reichmann aware of your request for a prescription for Oxybutynin5 mg once a day at bedtime   I look forward to talking to you again for an update on Thursday, March 09, 2021 at 1:00 pm- please be listening out for my call that day.  I will call as close to 1:00 pm as possible; I look forward to hearing about your progress.   Please don't hesitate to contact me if I can be of assistance to you before our next scheduled appointment.   Oneta Rack, RN, BSN, [facility] RN Care Coordination- [place] [phone]: direct office [phone]: mobile    PATIENT GOALS:   Goals Addressed             This Visit's Progress    Monitor and Manage My Blood Sugar and Blood pressure-Diabetes II and Hypertension   On track    Timeframe:  Long-Range Goal Priority:  Medium Start Date:           02/01/21                  Expected End Date:     02/01/22                  Follow Up Date 03/09/2021    Keep checking your blood sugars at home every morning before you eat and again a couple hours after eating if it is high in the morning- be sure to write these blood sugars down- we will review these during our future phone call appointments Check blood sugar extra times if you feel sick or if you feel like it may be too high or too low Check your blood pressures several times each week, alternate taking when at rest and when you are active: write down all of your blood pressures: we will review these during our  future phone call appointments Take the blood sugars and blood pressures you write down on paper to all doctor visits- this will help your doctors and care team know if your medications should be adjusted Continue following a heart healthy, low sugar, low-carbohydrate diet Read over the educational information I have sent you in the mail today- we will review the information periodically during our phone call appointments Be sure to take your medications as they are prescribed   Why is this important?   Checking your blood sugar at home helps to keep it from getting very high or very low.  Writing the results in a diary or log helps the doctor know how to care for you.  Your blood sugar log should have the time, the date and the results.  Also, write down the amount of insulin or other medicine you take.  Other information like what you ate, exercise done and how you were feeling will also be helpful..             Consent to CCM Services: [name] was given information about  Chronic Care Management services 12/26/20 including:  CCM service includes personalized support from designated clinical staff supervised by her physician, including individualized plan of care and coordination with other care providers 24/7 contact phone numbers for assistance for urgent and routine care needs. Service will only be billed when office clinical staff spend 20 minutes or more in a month to coordinate care. Only one practitioner may furnish and bill the service in a calendar month. The patient may stop CCM services at any time (effective at the end of the month) by phone call to the office staff. The patient will be responsible for cost sharing (co-pay) of up to 20% of the service fee (after annual deductible is met).  Patient agreed to services and verbal consent obtained.   The patient verbalized understanding of instructions, educational materials, and care plan provided today and agreed to  receive a mailed copy of patient instructions, educational materials, and care plan Telephone follow up appointment with care management team member scheduled for:  Thursday, March 09, 2021 at 1:00 pm The patient has been provided with contact information for the care management team and has been advised to call with any health related questions or concerns  Oneta Rack, RN, BSN, [place] [phone]: direct office [phone]: mobile   CLINICAL CARE PLAN:      Patient Care Plan: Diabetes Type 2 (Adult)      Problem Identified: Glycemic and HTN Management (Diabetes, Type 2/ HTN)   Priority: Medium     Long-Range Goal: Glycemic/ HTN  Management Optimized   Start Date: 02/01/2021  Expected End Date: 02/01/2022  This Visit's Progress: On track  Priority: Medium  Note:   Objective:  Lab Results  Component Value Date   HGBA1C 5.9 12/02/2020   Lab Results  Component Value Date   CREATININE 1.38 (H) 12/02/2020   CREATININE 1.62 (H) 06/21/2020   CREATININE 1.39 (H) 12/01/2019   No results found for: EGFR Current Barriers:  Knowledge Deficits related to basic Diabetes pathophysiology and self care/management: could benefit from ongoing reinforcement of self-health management strategies for DM/ HTN Difficulty obtaining or cannot afford medications: reports has completed paper work forms for PAP for Trulicity [employer])- reports has deleivered to PCP and is waiting follow up on status of forms being submitted 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 Visual limitations of (R) eye Case Manager Clinical Goal(s):  Over the next 12 months, patient will demonstrate improved adherence to prescribed treatment plan for diabetes and HTN self care/management as evidenced by patient reporting during CCM RN CM outreach of: Daily monitoring and recording of blood sugars Weekly monitoring of blood  pressures Adherence to ADA/ carb modified, low sugar, low salt diet  Adherence to prescribed medication regimen  Contacting provider for new or worsened symptoms or questions Interventions:  Collaboration with Biagio Borg, MD regarding development and update of comprehensive plan of care as evidenced by provider attestation and co-signature Inter-disciplinary care team collaboration (see longitudinal plan of care) Review of patient status, including review of consultants reports, relevant laboratory and other test results, and medications completed Discussed current clinical condition with patient; confirmed no current clinical concerns; initial assessment initiated/ completed Confirmed patient is independent in ADL's and iADL's- she is primary caregiver for her husband currently under Hospice Care Confirmed patient manages medications independently, uses weekly pill box: she denies medication concerns, but reports that she is waiting follow up from PCP  office around status of PAP for Trulicity ([place]); states she has completed her part of the form and took to Dr. Gwynn Burly office; has not heard back yet and has not had Trulicity to take for several weeks Medication review completed: Patient reports taking 2 maintenance inhalers and using rescue inhaler "several" times each day- she appears to think this is what she is supposed to be doing- states "I have been using them all several times a day;"  Education provided today around use of maintenance inhalers vs. rescue inhaler Patient reports she was recently followed by [facility]: states they prescribed for her "oxybutynin 5 mg qHS" for overactive bladder during nighttime hours: she would like for Dr. Jenny Reichmann to prescribe this now that [facility] is not involved in her care; patient stated, "this medicine really helped me" Will message PCP regarding patient's request for refill and place CCM pharmacy team referral for assistance with following  up on PAP for Trulicity and general medication (and inhaler) review/ optimization, patient education Discussed importance of medication adherence and encouraged her to listen out for [facility] team call Assessed patient's baseline knowledge and provided education as indicated around basic DM/ HTN disease process- patient will benefit from ongoing reinforcement of same Confirmed patient monitors/ records blood sugars QD- BID; mainly fasting; patient notes her blood sugars at home have increased now that she is out of Trulicity; reports fasting blood sugars "used to be 120-130; reports they have "been 160-180" since she has been out of Trulicity; reports fasting blood sugar yesterday of "177"   Confirmed patient monitors blood pressures weekly 2-3 times, reports blood pressure yesterday of "127/78" Encouraged patient to continue monitoring/ recording blood sugars and blood pressures at home for our future review together Confirmed patient tries to follow low sugar/ carb/salt diet: positive reinforcement provided, discussed benefit of ongoing dietary adherence/ compliance Does not have updated Living Well With Diabetes booklet: will mail to patient Reviewed scheduled/upcoming provider appointments including: June 01, 2021: cardiology provider; June 05, 2021: PCP Discussed plans with patient for ongoing care management follow up and provided patient with direct contact information for care management team Self-Care Activities Self administers oral medications as prescribed Independent in ADL's and iADL's Attends all scheduled provider appointments-- uses [employer] for transportation if family unable to provide Checks blood sugars and blood pressures as instructed Adheres to prescribed ADA/carb modified, low sugar, low salt diet Patient Goals: Keep checking your blood sugars at home every morning before you eat and again a couple hours after eating if it is high in the morning- be sure to write  these blood sugars down- we will review these during our future phone call appointments Check blood sugar extra times if you feel sick or if you feel like it may be too high or too low Check your blood pressures several times each week, alternate taking when at rest and when you are active: write down all of your blood pressures: we will review these during our future phone call appointments Take the blood sugars and blood pressures you write down on paper to all doctor visits- this will help your doctors and care team know if your medications should be adjusted Continue following a heart healthy, low sugar, low-carbohydrate diet Read over the educational information I have sent you in the mail today- we will review the information periodically during our phone call appointments Be sure to take your medications as they are prescribed Follow Up Plan:  Telephone follow up appointment with  care management team member scheduled for:  Thursday March 09, 2021 at 1:00 pm The patient has been provided with contact information for the care management team and has been advised to call with any health related questions or concerns.      Oneta Rack, RN, BSN, [facility] RN Care Coordination- [place] [phone]: direct office [phone]: mobile

## 2021-02-02 NOTE — Chronic Care Management (AMB) (Signed)
Chronic Care Management   CCM RN Visit Note  02/02/2021 Name: Brooke [name] MRN: [id number] DOB: 12/20/1950  Subjective: Brooke [name] is a 70 y.o. year old female who is a primary care patient of Biagio Borg, MD. The care management team was consulted for assistance with disease management and care coordination needs.    Engaged with patient by telephone for initial visit in response to provider referral for case management and/or care coordination services.   Consent to Services:  The patient was given information about Chronic Care Management services, agreed to services, and gave verbal consent 12/26/20 prior to initiation of services.  Please see initial visit note for detailed documentation.  Patient agreed to services and verbal consent obtained.   Assessment: Review of patient past medical history, allergies, medications, health status, including review of consultants reports, laboratory and other test data, was performed as part of comprehensive evaluation and provision of chronic care management services.   SDOH (Social Determinants of Health) assessments and interventions performed:  SDOH Interventions    Flowsheet Row Most Recent Value  SDOH Interventions   Food Insecurity Interventions Intervention Not Indicated  Housing Interventions Intervention Not Indicated  [Lives with her son and his family,  also her husband]  Transportation Interventions Intervention Not Indicated  [Uses [facility]]       CCM Care Plan  Allergies  Allergen Reactions   Metformin And Related Nausea And Vomiting   Ace Inhibitors Swelling        Codeine Nausea And Vomiting and Rash   Hydrocodone Nausea And Vomiting   Tramadol Nausea And Vomiting   Tylenol [Acetaminophen] Itching and Rash    Outpatient Encounter Medications as of 02/01/2021  Medication Sig Note   acetaminophen (TYLENOL) 325 MG tablet Take 650 mg by mouth every 6 (six) hours as needed for mild pain.    albuterol (VENTOLIN HFA)  108 (90 Base) MCG/ACT inhaler INHALE 2 PUFFS BY MOUTH EVERY 6 HOURS AS NEEDED FOR WHEEZE 02/01/2021: 02/01/21: Reports taking q 6 hours "every day"   allopurinol (ZYLOPRIM) 100 MG tablet TAKE 1 TABLET BY MOUTH EVERY DAY    amLODipine (NORVASC) 10 MG tablet TAKE 1 TABLET BY MOUTH EVERY DAY    citalopram (CELEXA) 40 MG tablet TAKE 1 TABLET BY MOUTH EVERY DAY    clonazePAM (KLONOPIN) 0.5 MG tablet TAKE 1 TABLET (0.5 MG TOTAL) BY MOUTH 2 (TWO) TIMES DAILY AS NEEDED (VERTIGO). 02/01/2021: 02/01/21: Reports has not needed recently    fexofenadine (ALLEGRA) 180 MG tablet TAKE 1 TABLET BY MOUTH EVERY DAY    fluticasone-salmeterol (WIXELA INHUB) 250-50 MCG/ACT AEPB TAKE 1 PUFF BY MOUTH TWICE A DAY    hydrALAZINE (APRESOLINE) 10 MG tablet Take 1 tablet (10 mg total) by mouth 3 (three) times daily.    KLOR-CON M20 20 MEQ tablet TAKE 1 TABLET BY MOUTH TWICE A DAY    meclizine (ANTIVERT) 25 MG tablet TAKE 1 TABLET BY MOUTH 3 TIMES DAILY AS NEEDED FOR DIZZINESS. 02/01/2021: 02/01/21: reports has not needed recently    metoprolol succinate (TOPROL-XL) 100 MG 24 hr tablet TAKE 1 TABLET BY MOUTH TWICE A DAY    telmisartan-hydrochlorothiazide (MICARDIS HCT) 80-25 MG tablet Take 1 tablet by mouth daily.    traZODone (DESYREL) 100 MG tablet TAKE 1 TABLET BY MOUTH AT BEDTIME AS NEEDED FOR SLEEP. 02/01/2021: 02/01/21: Patient reports she takes qHS   triamcinolone (NASACORT) 55 MCG/ACT AERO nasal inhaler PLACE 2 SPRAYS INTO THE NOSE DAILY.    [DISCONTINUED] omeprazole (  PRILOSEC) 20 MG capsule TAKE 1 CAPSULE BY MOUTH TWICE A DAY    AMBULATORY NON FORMULARY MEDICATION Medication Name: Rolling walker    Dulaglutide (TRULICITY) 1.68 HF/2.9MS SOPN Inject 0.75 mg into the skin once a week. (Patient not taking: Reported on 02/01/2021) 02/01/2021: 02/01/21: reports can not afford and has sent paper work for PAP to Jenny Reichmann: has not yet heard back- would like follow up   oxybutynin (DITROPAN) 5 MG tablet Take 5 mg by mouth daily at 10 pm.  (Patient not taking: Reported on 02/01/2021) 02/01/2021: Reports this was prescribed by [facility] MD- she would like PCP to refill/ prescribe this for her; she is out of refills   pregabalin (LYRICA) 50 MG capsule Take 1 capsule (50 mg total) by mouth 3 (three) times daily. (Patient not taking: Reported on 02/01/2021)    rosuvastatin (CRESTOR) 20 MG tablet TAKE 1 TABLET (20 MG TOTAL) BY MOUTH DAILY. ANNUAL APPT DUE IN [place] (Patient not taking: Reported on 02/01/2021) 02/01/2021: 02/01/21: Patient reports this medication was discontinued by PCP   No facility-administered encounter medications on file as of 02/01/2021.    Patient Active Problem List   Diagnosis Date Noted   Bilateral shoulder pain 03/05/2020   Vitamin D deficiency 12/01/2019   Allergic rhinitis 06/16/2019   Asthma exacerbation 06/16/2019   Dyspnea 01/27/2019   Primary osteoarthritis of left knee 09/30/2018   CKD (chronic kidney disease) stage 3, GFR 30-59 ml/min (HCC) 07/25/2018   Chronic back pain 07/25/2018   Primary osteoarthritis of right knee 05/05/2018   Osteoarthritis of right knee 04/30/2018   Degenerative arthritis of knee, bilateral 08/07/2017   Depression with anxiety 07/19/2017   Vertigo 01/12/2017   Low back pain 01/12/2017   SOB (shortness of breath) 12/02/2016   Dysuria 09/12/2016   Bilateral knee pain 02/15/2015   Effusion of right knee 01/28/2015   Asthma with exacerbation 11/09/2014   Pronation deformity of both feet 09/29/2014   Greater trochanteric bursitis of left hip 08/31/2014   Degenerative arthritis of left knee 05/18/2014   Ventricular tachycardia, polymorphic ([place]) 01/04/2014   Abnormal MRI of head 12/04/2013   Syncope 11/06/2013   Chest pain 11/06/2013   Headache 11/06/2013   Nausea with vomiting 11/06/2013   Cough 08/28/2013   Hypokalemia 06/07/5207   Diastolic dysfunction 09/14/3610   Leukocytosis 01/03/2011   Toe pain 12/20/2010   Encounter for well adult exam with abnormal findings  12/19/2010   FATIGUE 09/05/2009   Diabetes ([place]) 11/29/2008   Gout 08/09/2008   Normochromic normocytic anemia 02/09/2008   GERD 02/09/2008   ALOPECIA 12/30/2007   Hyperlipidemia 10/01/2007   Essential hypertension 02/26/2007   COLONIC POLYPS, HX OF 02/26/2007   Goiter 09/19/2006   Asthma 09/19/2006   ECZEMA, ATOPIC DERMATITIS 09/19/2006   Insomnia 09/19/2006    Conditions to be addressed/monitored:  HTN and DMII  Care Plan : Diabetes Type 2 (Adult)  Updates made by Knox Royalty, RN since 02/02/2021 12:00 AM     Problem: Glycemic and HTN Management (Diabetes, Type 2/ HTN)   Priority: Medium     Long-Range Goal: Glycemic/ HTN  Management Optimized   Start Date: 02/01/2021  Expected End Date: 02/01/2022  This Visit's Progress: On track  Priority: Medium  Note:   Objective:  Lab Results  Component Value Date   HGBA1C 5.9 12/02/2020   Lab Results  Component Value Date   CREATININE 1.38 (H) 12/02/2020   CREATININE 1.62 (H) 06/21/2020   CREATININE 1.39 (H) 12/01/2019  No results found for: EGFR Current Barriers:  Knowledge Deficits related to basic Diabetes pathophysiology and self care/management: could benefit from ongoing reinforcement of self-health management strategies for DM/ HTN Difficulty obtaining or cannot afford medications: reports has completed paper work forms for PAP for Trulicity [employer])- reports has deleivered to PCP and is waiting follow up on status of forms being submitted 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 Visual limitations of (R) eye Case Manager Clinical Goal(s):  Over the next 12 months, patient will demonstrate improved adherence to prescribed treatment plan for diabetes and HTN self care/management as evidenced by patient reporting during CCM RN CM outreach of: Daily monitoring and recording of blood sugars Weekly monitoring of blood pressures Adherence to ADA/ carb modified, low sugar, low salt diet  Adherence to  prescribed medication regimen  Contacting provider for new or worsened symptoms or questions Interventions:  Collaboration with Biagio Borg, MD regarding development and update of comprehensive plan of care as evidenced by provider attestation and co-signature Inter-disciplinary care team collaboration (see longitudinal plan of care) Review of patient status, including review of consultants reports, relevant laboratory and other test results, and medications completed Discussed current clinical condition with patient; confirmed no current clinical concerns; initial assessment initiated/ completed Confirmed patient is independent in ADL's and iADL's- she is primary caregiver for her husband currently under Hospice Care Confirmed patient manages medications independently, uses weekly pill box: she denies medication concerns, but reports that she is waiting follow up from PCP office around status of PAP for Trulicity ([facility]); states she has completed her part of the form and took to Dr. Gwynn Burly office; has not heard back yet and has not had Trulicity to take for several weeks Medication review completed: Patient reports taking 2 maintenance inhalers and using rescue inhaler "several" times each day- she appears to think this is what she is supposed to be doing- states "I have been using them all several times a day;"  Education provided today around use of maintenance inhalers vs. rescue inhaler Patient reports she was recently followed by [facility]: states they prescribed for her "oxybutynin 5 mg qHS" for overactive bladder during nighttime hours: she would like for Dr. Jenny Reichmann to prescribe this now that [facility] is not involved in her care; patient stated, "this medicine really helped me" Will message PCP regarding patient's request for refill and place CCM pharmacy team referral for assistance with following up on PAP for Trulicity and general medication (and inhaler) review/ optimization,  patient education Discussed importance of medication adherence and encouraged her to listen out for [facility] team call Assessed patient's baseline knowledge and provided education as indicated around basic DM/ HTN disease process- patient will benefit from ongoing reinforcement of same Confirmed patient monitors/ records blood sugars QD- BID; mainly fasting; patient notes her blood sugars at home have increased now that she is out of Trulicity; reports fasting blood sugars "used to be 120-130; reports they have "been 160-180" since she has been out of Trulicity; reports fasting blood sugar yesterday of "177"   Confirmed patient monitors blood pressures weekly 2-3 times, reports blood pressure yesterday of "127/78" Encouraged patient to continue monitoring/ recording blood sugars and blood pressures at home for our future review together Confirmed patient tries to follow low sugar/ carb/salt diet: positive reinforcement provided, discussed benefit of ongoing dietary adherence/ compliance Does not have updated Living Well With Diabetes booklet: will mail to patient Reviewed scheduled/upcoming provider appointments including: June 01, 2021: cardiology provider; June 05, 2021: PCP Discussed plans with patient for ongoing care management follow up and provided patient with direct contact information for care management team Self-Care Activities Self administers oral medications as prescribed Independent in ADL's and iADL's Attends all scheduled provider appointments-- uses [facility] for transportation if family unable to provide Checks blood sugars and blood pressures as instructed Adheres to prescribed ADA/carb modified, low sugar, low salt diet Patient Goals: Keep checking your blood sugars at home every morning before you eat and again a couple hours after eating if it is high in the morning- be sure to write these blood sugars down- we will review these during our future phone call  appointments Check blood sugar extra times if you feel sick or if you feel like it may be too high or too low Check your blood pressures several times each week, alternate taking when at rest and when you are active: write down all of your blood pressures: we will review these during our future phone call appointments Take the blood sugars and blood pressures you write down on paper to all doctor visits- this will help your doctors and care team know if your medications should be adjusted Continue following a heart healthy, low sugar, low-carbohydrate diet Read over the educational information I have sent you in the mail today- we will review the information periodically during our phone call appointments Be sure to take your medications as they are prescribed Follow Up Plan:  Telephone follow up appointment with care management team member scheduled for:  Thursday March 09, 2021 at 1:00 pm The patient has been provided with contact information for the care management team and has been advised to call with any health related questions or concerns.      Plan: Telephone follow up appointment with care management team member scheduled for:  Thursday, March 09, 2021 at 1:00 pm The patient has been provided with contact information for the care management team and has been advised to call with any health related questions or concerns  Oneta Rack, RN, BSN, [place] [phone]: direct office [phone]: mobile

## 2021-02-03 ENCOUNTER — Ambulatory Visit: Payer: Medicare Other | Admitting: *Deleted

## 2021-02-03 DIAGNOSIS — I10 Essential (primary) hypertension: Secondary | ICD-10-CM | POA: Diagnosis not present

## 2021-02-03 DIAGNOSIS — E119 Type 2 diabetes mellitus without complications: Secondary | ICD-10-CM | POA: Diagnosis not present

## 2021-02-03 NOTE — Chronic Care Management (AMB) (Signed)
Chronic Care Management   CCM RN Visit Note  02/03/2021 Name: Brooke [name] MRN: [id number] DOB: May 30, 1951  Subjective: Brooke [name] is a 70 y.o. year old female who is a primary care patient of Biagio Borg, MD. The care management team was consulted for assistance with disease management and care coordination needs.    Engaged with patient by telephone for acute/ follow up visit in response to provider referral for case management and/or care coordination services. Patient left voicemail message requesting call-back "as soon as possible;" call to patient was placed within 10 minutes of her leaving voice message  Consent to Services:  The patient was given information about Chronic Care Management services, agreed to services, and gave verbal consent prior to initiation of services.  Please see initial visit note for detailed documentation.  Patient agreed to services and verbal consent obtained.   Assessment: Review of patient past medical history, allergies, medications, health status, including review of consultants reports, laboratory and other test data, was performed as part of comprehensive evaluation and provision of chronic care management services.  CCM Care Plan  Allergies  Allergen Reactions   Metformin And Related Nausea And Vomiting   Ace Inhibitors Swelling        Codeine Nausea And Vomiting and Rash   Hydrocodone Nausea And Vomiting   Tramadol Nausea And Vomiting   Tylenol [Acetaminophen] Itching and Rash    Outpatient Encounter Medications as of 02/03/2021  Medication Sig Note   acetaminophen (TYLENOL) 325 MG tablet Take 650 mg by mouth every 6 (six) hours as needed for mild pain.    albuterol (VENTOLIN HFA) 108 (90 Base) MCG/ACT inhaler INHALE 2 PUFFS BY MOUTH EVERY 6 HOURS AS NEEDED FOR WHEEZE 02/01/2021: 02/01/21: Reports taking q 6 hours "every day"   allopurinol (ZYLOPRIM) 100 MG tablet TAKE 1 TABLET BY MOUTH EVERY DAY    AMBULATORY NON FORMULARY MEDICATION  Medication Name: Rolling walker    amLODipine (NORVASC) 10 MG tablet TAKE 1 TABLET BY MOUTH EVERY DAY    citalopram (CELEXA) 40 MG tablet TAKE 1 TABLET BY MOUTH EVERY DAY    clonazePAM (KLONOPIN) 0.5 MG tablet TAKE 1 TABLET (0.5 MG TOTAL) BY MOUTH 2 (TWO) TIMES DAILY AS NEEDED (VERTIGO). 02/01/2021: 02/01/21: Reports has not needed recently    Dulaglutide (TRULICITY) 6.16 WV/3.7TG SOPN Inject 0.75 mg into the skin once a week. (Patient not taking: Reported on 02/01/2021) 02/01/2021: 02/01/21: reports can not afford and has sent paper work for PAP to [facility]: has not yet heard back- would like follow up   fexofenadine (ALLEGRA) 180 MG tablet TAKE 1 TABLET BY MOUTH EVERY DAY    fluticasone-salmeterol (WIXELA INHUB) 250-50 MCG/ACT AEPB TAKE 1 PUFF BY MOUTH TWICE A DAY    hydrALAZINE (APRESOLINE) 10 MG tablet Take 1 tablet (10 mg total) by mouth 3 (three) times daily.    KLOR-CON M20 20 MEQ tablet TAKE 1 TABLET BY MOUTH TWICE A DAY    meclizine (ANTIVERT) 25 MG tablet TAKE 1 TABLET BY MOUTH 3 TIMES DAILY AS NEEDED FOR DIZZINESS. 02/01/2021: 02/01/21: reports has not needed recently    metoprolol succinate (TOPROL-XL) 100 MG 24 hr tablet TAKE 1 TABLET BY MOUTH TWICE A DAY    omeprazole (PRILOSEC) 20 MG capsule TAKE 1 CAPSULE BY MOUTH TWICE A DAY    oxybutynin (DITROPAN) 5 MG tablet Take 1 tablet (5 mg total) by mouth daily at 10 pm.    pregabalin (LYRICA) 50 MG capsule Take 1 capsule (  50 mg total) by mouth 3 (three) times daily. (Patient not taking: Reported on 02/01/2021)    rosuvastatin (CRESTOR) 20 MG tablet TAKE 1 TABLET (20 MG TOTAL) BY MOUTH DAILY. ANNUAL APPT DUE IN [place] (Patient not taking: Reported on 02/01/2021) 02/01/2021: 02/01/21: Patient reports this medication was discontinued by PCP   telmisartan-hydrochlorothiazide (MICARDIS HCT) 80-25 MG tablet Take 1 tablet by mouth daily.    traZODone (DESYREL) 100 MG tablet TAKE 1 TABLET BY MOUTH AT BEDTIME AS NEEDED FOR SLEEP. 02/01/2021: 02/01/21: Patient reports  she takes qHS   triamcinolone (NASACORT) 55 MCG/ACT AERO nasal inhaler PLACE 2 SPRAYS INTO THE NOSE DAILY.    No facility-administered encounter medications on file as of 02/03/2021.    Patient Active Problem List   Diagnosis Date Noted   Bilateral shoulder pain 03/05/2020   Vitamin D deficiency 12/01/2019   Allergic rhinitis 06/16/2019   Asthma exacerbation 06/16/2019   Dyspnea 01/27/2019   Primary osteoarthritis of left knee 09/30/2018   CKD (chronic kidney disease) stage 3, GFR 30-59 ml/min (HCC) 07/25/2018   Chronic back pain 07/25/2018   Primary osteoarthritis of right knee 05/05/2018   Osteoarthritis of right knee 04/30/2018   Degenerative arthritis of knee, bilateral 08/07/2017   Depression with anxiety 07/19/2017   Vertigo 01/12/2017   Low back pain 01/12/2017   SOB (shortness of breath) 12/02/2016   Dysuria 09/12/2016   Bilateral knee pain 02/15/2015   Effusion of right knee 01/28/2015   Asthma with exacerbation 11/09/2014   Pronation deformity of both feet 09/29/2014   Greater trochanteric bursitis of left hip 08/31/2014   Degenerative arthritis of left knee 05/18/2014   Ventricular tachycardia, polymorphic ([place]) 01/04/2014   Abnormal MRI of head 12/04/2013   Syncope 11/06/2013   Chest pain 11/06/2013   Headache 11/06/2013   Nausea with vomiting 11/06/2013   Cough 08/28/2013   Hypokalemia 09/73/5329   Diastolic dysfunction 92/42/6834   Leukocytosis 01/03/2011   Toe pain 12/20/2010   Encounter for well adult exam with abnormal findings 12/19/2010   FATIGUE 09/05/2009   Diabetes ([place]) 11/29/2008   Gout 08/09/2008   Normochromic normocytic anemia 02/09/2008   GERD 02/09/2008   ALOPECIA 12/30/2007   Hyperlipidemia 10/01/2007   Essential hypertension 02/26/2007   COLONIC POLYPS, HX OF 02/26/2007   Goiter 09/19/2006   Asthma 09/19/2006   ECZEMA, ATOPIC DERMATITIS 09/19/2006   Insomnia 09/19/2006   Conditions to be addressed/monitored:  HTN and DMII  Care  Plan : Diabetes Type 2 (Adult)  Updates made by Knox Royalty, RN since 02/03/2021 12:00 AM     Problem: Glycemic and HTN Management (Diabetes, Type 2/ HTN)   Priority: Medium     Long-Range Goal: Glycemic/ HTN  Management Optimized   Start Date: 02/01/2021  Expected End Date: 02/01/2022  This Visit's Progress: On track  Recent Progress: On track  Priority: Medium  Note:   Objective:  Lab Results  Component Value Date   HGBA1C 5.9 12/02/2020   Lab Results  Component Value Date   CREATININE 1.38 (H) 12/02/2020   CREATININE 1.62 (H) 06/21/2020   CREATININE 1.39 (H) 12/01/2019   No results found for: EGFR Current Barriers:  Knowledge Deficits related to basic Diabetes pathophysiology and self care/management: could benefit from ongoing reinforcement of self-health management strategies for DM/ HTN Difficulty obtaining or cannot afford medications: reports has completed paper work forms for PAP for Trulicity [employer])- reports has deleivered to PCP and is waiting follow up on status of forms being submitted 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 Visual limitations of (R) eye Case Manager Clinical Goal(s):  Over the next 12 months, patient will demonstrate improved adherence to prescribed treatment plan for diabetes and HTN self care/management as evidenced by patient reporting during CCM RN CM outreach of: Daily monitoring and recording of blood sugars Weekly monitoring of blood pressures Adherence to ADA/ carb modified, low sugar, low salt diet  Adherence to prescribed medication regimen  Contacting provider for new or worsened symptoms or questions Interventions:  Collaboration with Biagio Borg, MD regarding development and update of comprehensive plan of care as evidenced by provider attestation and co-signature Inter-disciplinary care team collaboration (see longitudinal plan of care) Review of patient status, including review of consultants reports,  relevant laboratory and other test results, and medications completed Acute call from patient 02/03/21: patient left voice mail message, requesting call back as soon as possible" Patient reported that she received a call from someone today, although she is not sure who, whether it is from [place] or PCP office-- reports that she was told by this person that PCP has to send in the form for her PAP application to [facility] for her PAP for Trulicity to be authorized; patient wants to make sure that this has been done; I confirmed through review of EHR notes dated 01/20/21 and 01/27/21 that it appears the PAP form has been faxed to [facility] and also assured patient that I had placed referral to [place] team when we spoke on 02/01/21; I re-assured her that it appears PCP is working on her PAP and encouraged her to listen out for [facility] team.  I also made patient aware that Dr. Jenny Reichmann responded back to my message, relaying patient's request for oxybutynin- shared that Dr. Jenny Reichmann had sent a prescription per her request to her outpatient pharmacy; she is appreciative of the follow up.  I will message PCP office team to confirm with patient that PAP form for Trulicity has been faxed. From 02/01/21: Discussed current clinical condition with patient; confirmed no current clinical concerns; initial assessment initiated/ completed Confirmed patient is independent in ADL's and iADL's- she is primary caregiver for her husband currently under Hospice Care Confirmed patient manages medications independently, uses weekly pill box: she denies medication concerns, but reports that she is waiting follow up from PCP office around status of PAP for Trulicity ([place]); states she has completed her part of the form and took to Dr. Gwynn Burly office; has not heard back yet and has not had Trulicity to take for several weeks Medication review completed: Patient reports taking 2 maintenance inhalers and using rescue inhaler "several"  times each day- she appears to think this is what she is supposed to be doing- states "I have been using them all several times a day;"  Education provided today around use of maintenance inhalers vs. rescue inhaler Patient reports she was recently followed by [facility]: states they prescribed for her "oxybutynin 5 mg qHS" for overactive bladder during nighttime hours: she would like for Dr. Jenny Reichmann to prescribe this now that [facility] is not involved in her care; patient stated, "this medicine really helped me" Will message PCP regarding patient's request for refill and place CCM pharmacy team referral for assistance with following up on PAP for Trulicity and general medication (and inhaler) review/ optimization, patient education Discussed importance of medication adherence and encouraged her to listen out for [facility] team call Assessed patient's baseline knowledge and provided education as  indicated around basic DM/ HTN disease process- patient will benefit from ongoing reinforcement of same Confirmed patient monitors/ records blood sugars QD- BID; mainly fasting; patient notes her blood sugars at home have increased now that she is out of Trulicity; reports fasting blood sugars "used to be 120-130; reports they have "been 160-180" since she has been out of Trulicity; reports fasting blood sugar yesterday of "177"   Confirmed patient monitors blood pressures weekly 2-3 times, reports blood pressure yesterday of "127/78" Encouraged patient to continue monitoring/ recording blood sugars and blood pressures at home for our future review together Confirmed patient tries to follow low sugar/ carb/salt diet: positive reinforcement provided, discussed benefit of ongoing dietary adherence/ compliance Does not have updated Living Well With Diabetes booklet: will mail to patient Reviewed scheduled/upcoming provider appointments including: June 01, 2021: cardiology provider; June 05, 2021: PCP Discussed plans with patient for ongoing care management follow up and provided patient with direct contact information for care management team Self-Care Activities Self administers oral medications as prescribed Independent in ADL's and iADL's Attends all scheduled provider appointments-- uses [employer] for transportation if family unable to provide Checks blood sugars and blood pressures as instructed Adheres to prescribed ADA/carb modified, low sugar, low salt diet Patient Goals: Keep checking your blood sugars at home every morning before you eat and again a couple hours after eating if it is high in the morning- be sure to write these blood sugars down- we will review these during our future phone call appointments Check blood sugar extra times if you feel sick or if you feel like it may be too high or too low Check your blood pressures several times each week, alternate taking when at rest and when you are active: write down all of your blood pressures: we will review these during our future phone call appointments Take the blood sugars and blood pressures you write down on paper to all doctor visits- this will help your doctors and care team know if your medications should be adjusted Continue following a heart healthy, low sugar, low-carbohydrate diet Read over the educational information I have sent you in the mail today- we will review the information periodically during our phone call appointments Be sure to take your medications as they are prescribed Follow Up Plan:  Telephone follow up appointment with care management team member scheduled for:  Thursday March 09, 2021 at 1:00 pm The patient has been provided with contact information for the care management team and has been advised to call with any health related questions or concerns.      Plan: Telephone follow up appointment with care management team member (as previously scheduled) scheduled for:  August ,2022 1:00  pm The patient has been provided with contact information for the care management team and has been advised to call with any health related questions or concerns  Oneta Rack, RN, BSN, [place] [phone]: direct office [phone]: mobile

## 2021-02-03 NOTE — Telephone Encounter (Signed)
Patient is sending a new copy. Other copy had been sent off.

## 2021-02-03 NOTE — Patient Instructions (Signed)
Visit Information  PATIENT GOALS:  Goals Addressed   None     The patient verbalized understanding of instructions, educational materials, and care plan provided today and declined offer to receive copy of patient instructions, educational materials, and care plan.   Telephone follow up appointment with care management team member scheduled for: March 09, 2021  Oneta Rack, RN, BSN, [place] [phone]: direct office [phone]: mobile

## 2021-02-07 DIAGNOSIS — D631 Anemia in chronic kidney disease: Secondary | ICD-10-CM | POA: Diagnosis not present

## 2021-02-07 DIAGNOSIS — N390 Urinary tract infection, site not specified: Secondary | ICD-10-CM | POA: Diagnosis not present

## 2021-02-07 DIAGNOSIS — E785 Hyperlipidemia, unspecified: Secondary | ICD-10-CM | POA: Diagnosis not present

## 2021-02-07 DIAGNOSIS — N2581 Secondary hyperparathyroidism of renal origin: Secondary | ICD-10-CM | POA: Diagnosis not present

## 2021-02-07 DIAGNOSIS — R809 Proteinuria, unspecified: Secondary | ICD-10-CM | POA: Diagnosis not present

## 2021-02-07 DIAGNOSIS — N1832 Chronic kidney disease, stage 3b: Secondary | ICD-10-CM | POA: Diagnosis not present

## 2021-02-07 DIAGNOSIS — Z791 Long term (current) use of non-steroidal anti-inflammatories (NSAID): Secondary | ICD-10-CM | POA: Diagnosis not present

## 2021-02-07 DIAGNOSIS — N281 Cyst of kidney, acquired: Secondary | ICD-10-CM | POA: Diagnosis not present

## 2021-02-07 DIAGNOSIS — I129 Hypertensive chronic kidney disease with stage 1 through stage 4 chronic kidney disease, or unspecified chronic kidney disease: Secondary | ICD-10-CM | POA: Diagnosis not present

## 2021-02-15 ENCOUNTER — Telehealth: Payer: Self-pay | Admitting: Internal Medicine

## 2021-02-15 NOTE — Chronic Care Management (AMB) (Signed)
Patient has been scheduled with the clinical pharmacist.   Chronic Care Management   Note  02/15/2021 Name: Brooke [name] MRN: [id number] DOB: Jun 02, 1951  Brooke [name] is a 70 y.o. year old female who is a primary care patient of Biagio Borg, MD. I reached out to [employer] by phone today in response to a referral sent by Brooke [name] PCP, Biagio Borg, MD.   Brooke [name] was given information about Chronic Care Management services today including:  CCM service includes personalized support from designated clinical staff supervised by her physician, including individualized plan of care and coordination with other care providers 24/7 contact phone numbers for assistance for urgent and routine care needs. Service will only be billed when office clinical staff spend 20 minutes or more in a month to coordinate care. Only one practitioner may furnish and bill the service in a calendar month. The patient may stop CCM services at any time (effective at the end of the month) by phone call to the office staff.   Patient agreed to services and verbal consent obtained.   Follow up plan:   Lauretta Grill Upstream Scheduler

## 2021-02-21 ENCOUNTER — Telehealth: Payer: Self-pay

## 2021-02-21 NOTE — Chronic Care Management (AMB) (Signed)
Chronic Care Management Pharmacy Assistant   Name: Brooke [name]  MRN: [id number] DOB: 21-Jun-1951  Brooke [name] is an 70 y.o. year old female who presents for his initial CCM visit with the clinical pharmacist.  Recent office visits:  12/02/20 [name] MD(PCP)- Patient was seen for well adult exam. Labs were ordered. Patient started Dulaglutide 0.75 mg subcu and Pregablin 50 mg oral TID. Patients Gabapentin was increased to 40 mg daily. Follow up in 6 months.   10/10/20 [name] MD(Telephone)- Patient called requesting referral to Nephrology to discuss chronic kidney disease. Referral was place.  Recent consult visits:  None Noted  Hospital visits:  None in previous 6 months  Medications: Outpatient Encounter Medications as of 02/21/2021  Medication Sig Note   acetaminophen (TYLENOL) 325 MG tablet Take 650 mg by mouth every 6 (six) hours as needed for mild pain.    albuterol (VENTOLIN HFA) 108 (90 Base) MCG/ACT inhaler INHALE 2 PUFFS BY MOUTH EVERY 6 HOURS AS NEEDED FOR WHEEZE 02/01/2021: 02/01/21: Reports taking q 6 hours "every day"   allopurinol (ZYLOPRIM) 100 MG tablet TAKE 1 TABLET BY MOUTH EVERY DAY    AMBULATORY NON FORMULARY MEDICATION Medication Name: Rolling walker    amLODipine (NORVASC) 10 MG tablet TAKE 1 TABLET BY MOUTH EVERY DAY    citalopram (CELEXA) 40 MG tablet TAKE 1 TABLET BY MOUTH EVERY DAY    clonazePAM (KLONOPIN) 0.5 MG tablet TAKE 1 TABLET (0.5 MG TOTAL) BY MOUTH 2 (TWO) TIMES DAILY AS NEEDED (VERTIGO). 02/01/2021: 02/01/21: Reports has not needed recently    Dulaglutide (TRULICITY) A999333 0000000 SOPN Inject 0.75 mg into the skin once a week. (Patient not taking: Reported on 02/01/2021) 02/01/2021: 02/01/21: reports can not afford and has sent paper work for PAP to [facility]: has not yet heard back- would like follow up   fexofenadine (ALLEGRA) 180 MG tablet TAKE 1 TABLET BY MOUTH EVERY DAY    fluticasone-salmeterol (WIXELA INHUB) 250-50 MCG/ACT AEPB TAKE 1 PUFF BY  MOUTH TWICE A DAY    hydrALAZINE (APRESOLINE) 10 MG tablet Take 1 tablet (10 mg total) by mouth 3 (three) times daily.    KLOR-CON M20 20 MEQ tablet TAKE 1 TABLET BY MOUTH TWICE A DAY    meclizine (ANTIVERT) 25 MG tablet TAKE 1 TABLET BY MOUTH 3 TIMES DAILY AS NEEDED FOR DIZZINESS. 02/01/2021: 02/01/21: reports has not needed recently    metoprolol succinate (TOPROL-XL) 100 MG 24 hr tablet TAKE 1 TABLET BY MOUTH TWICE A DAY    omeprazole (PRILOSEC) 20 MG capsule TAKE 1 CAPSULE BY MOUTH TWICE A DAY    oxybutynin (DITROPAN) 5 MG tablet Take 1 tablet (5 mg total) by mouth daily at 10 pm.    pregabalin (LYRICA) 50 MG capsule Take 1 capsule (50 mg total) by mouth 3 (three) times daily. (Patient not taking: Reported on 02/01/2021)    rosuvastatin (CRESTOR) 20 MG tablet TAKE 1 TABLET (20 MG TOTAL) BY MOUTH DAILY. ANNUAL APPT DUE IN [place] (Patient not taking: Reported on 02/01/2021) 02/01/2021: 02/01/21: Patient reports this medication was discontinued by PCP   telmisartan-hydrochlorothiazide (MICARDIS HCT) 80-25 MG tablet Take 1 tablet by mouth daily.    traZODone (DESYREL) 100 MG tablet TAKE 1 TABLET BY MOUTH AT BEDTIME AS NEEDED FOR SLEEP. 02/01/2021: 02/01/21: Patient reports she takes qHS   triamcinolone (NASACORT) 55 MCG/ACT AERO nasal inhaler PLACE 2 SPRAYS INTO THE NOSE DAILY.    No facility-administered encounter medications on file as of 02/21/2021.  Current Medication List  acetaminophen (TYLENOL) 325 MG tablet albuterol (VENTOLIN HFA) 108 (90 Base) MCG/ACT inhaler allopurinol (ZYLOPRIM) 100 MG tablet last filled 12/31/20 90 DS amlodipine (NORVASC) 10 MG tablet citalopram (CELEXA) 40 MG tablet last filled 11/26/20 90 DS clonazepam (KLONOPIN) 0.5 MG tablet Dulaglutide (TRULICITY) A999333 0000000 SOPN last filled 12/05/20 84 DS fexofenadine (ALLEGRA) 180 MG tablet last filled 01/16/21 90 DS fluticasone-salmeterol (WIXELA INHUB) 250-50 MCG/ACT AEPB last filled 02/16/21 30 DS hydralazine (APRESOLINE) 10 MG  tablet metoprolol succinate (TOPROL-XL) 100 MG 24 hr tablet last filled 02/17/21 90 DS omeprazole (PRILOSEC) 20 MG capsule last filled 12/31/20 90 DS oxybutynin (DITROPAN) 5 MG tablet last filled 02/02/21 90 DS pregabalin (LYRICA) 50 MG capsule last filled 12/02/20 30 DS telmisartan-hydrochlorothiazide (MICARDIS HCT) 80-25 MG tablet last filled 12/17/20 90 DS trazodone (DESYREL) 100 MG tablet last filled 02/04/21 90 DS triamcinolone (NASACORT) 55 MCG/ACT AERO last filled 01/11/21 60 DS   [place] Pharmacist Assistant [phone]

## 2021-02-22 ENCOUNTER — Other Ambulatory Visit: Payer: Self-pay | Admitting: Internal Medicine

## 2021-02-28 ENCOUNTER — Ambulatory Visit: Payer: Medicare Other | Admitting: *Deleted

## 2021-03-05 ENCOUNTER — Other Ambulatory Visit: Payer: Self-pay | Admitting: Internal Medicine

## 2021-03-06 ENCOUNTER — Other Ambulatory Visit: Payer: Self-pay | Admitting: Cardiology

## 2021-03-07 ENCOUNTER — Telehealth: Payer: Self-pay

## 2021-03-07 NOTE — Telephone Encounter (Signed)
Unfortuanately, I see where she has been on gabapentin in the past, and there is little else to offer    I can try to go up on the dose if she likes.  thanks

## 2021-03-07 NOTE — Telephone Encounter (Signed)
Left message for patient to call me back. 

## 2021-03-07 NOTE — Telephone Encounter (Addendum)
Follow up message   Patient states she does want to take increased dosage of Gabapentin

## 2021-03-07 NOTE — Telephone Encounter (Signed)
Patient stating the Lyrica is not working please send something else for right side back pain.

## 2021-03-08 NOTE — Telephone Encounter (Signed)
No sorry  - I meant an increased dose of the lyrica if she wants

## 2021-03-08 NOTE — Telephone Encounter (Signed)
Pt notified that PCP is out of office until Aug 22 & that he will take care of increasing dosage at that time.  Pt verb understanding.

## 2021-03-09 ENCOUNTER — Ambulatory Visit (INDEPENDENT_AMBULATORY_CARE_PROVIDER_SITE_OTHER): Payer: Medicare Other | Admitting: *Deleted

## 2021-03-09 DIAGNOSIS — E119 Type 2 diabetes mellitus without complications: Secondary | ICD-10-CM

## 2021-03-09 DIAGNOSIS — I10 Essential (primary) hypertension: Secondary | ICD-10-CM

## 2021-03-09 MED ORDER — PREGABALIN 150 MG PO CAPS: 150.0000 mg | ORAL_CAPSULE | Freq: Two times a day (BID) | ORAL | 5 refills | Status: DC

## 2021-03-09 NOTE — Telephone Encounter (Signed)
Pt states she will try the increased dosage of Lyrica.

## 2021-03-09 NOTE — Patient Instructions (Signed)
Visit Information  PATIENT GOALS:  Goals Addressed             This Visit's Progress    Monitor and Manage My Blood Sugar and Blood pressure-Diabetes II and Hypertension   On track    Timeframe:  Long-Range Goal Priority:  Medium Start Date:           02/01/21                  Expected End Date:     02/01/22                  Follow Up Date 03/09/2021    Keep checking your blood sugars at home every morning before you eat and again a couple hours after eating if it is high in the morning- be sure to write these blood sugars down- we will review these during our future phone call appointments Check blood sugar extra times if you feel sick or if you feel like it may be too high or too low The blood sugars you reported today are within normal limits Check your blood pressures several times each week, alternate taking when at rest and when you are active: write down all of your blood pressures: we will review these during our future phone call appointments- the blood pressure you reported today is within normal limits Take the blood sugars and blood pressures you write down on paper to all doctor visits- this will help your doctors and care team know if your medications should be adjusted Continue following a heart healthy, low sugar, low-carbohydrate diet Great job taking your medications as they are prescribed   Why is this important?   Checking your blood sugar at home helps to keep it from getting very high or very low.  Writing the results in a diary or log helps the doctor know how to care for you.  Your blood sugar log should have the time, the date and the results.  Also, write down the amount of insulin or other medicine you take.  Other information like what you ate, exercise done and how you were feeling will also be helpful..            The patient verbalized understanding of instructions, educational materials, and care plan provided today and declined offer to receive  copy of patient instructions, educational materials, and care plan Telephone follow up appointment with care management team member scheduled for:  Tuesday April 11, 2021 at 1:00 pm The patient has been provided with contact information for the care management team and has been advised to call with any health related questions or concerns  Oneta Rack, RN, BSN, [place] [phone]: direct office [phone]: mobile

## 2021-03-09 NOTE — Chronic Care Management (AMB) (Signed)
Chronic Care Management   CCM RN Visit Note  03/09/2021 Name: Brooke [name] MRN: [id number] DOB: [birth year]  Subjective: Brooke [name] is a 70 y.o. year old female who is a primary care patient of Biagio Borg, MD. The care management team was consulted for assistance with disease management and care coordination needs.    Engaged with patient by telephone for initial visit in response to provider referral for case management and/or care coordination services.   Consent to Services:  The patient was given information about Chronic Care Management services, agreed to services, and gave verbal consent prior to initiation of services.  Please see initial visit note for detailed documentation.  Patient agreed to services and verbal consent obtained.   Assessment: Review of patient past medical history, allergies, medications, health status, including review of consultants reports, laboratory and other test data, was performed as part of comprehensive evaluation and provision of chronic care management services.   CCM Care Plan Allergies  Allergen Reactions   Metformin And Related Nausea And Vomiting   Ace Inhibitors Swelling        Codeine Nausea And Vomiting and Rash   Hydrocodone Nausea And Vomiting   Tramadol Nausea And Vomiting   Tylenol [Acetaminophen] Itching and Rash   Outpatient Encounter Medications as of 03/09/2021  Medication Sig Note   acetaminophen (TYLENOL) 325 MG tablet Take 650 mg by mouth every 6 (six) hours as needed for mild pain.    albuterol (VENTOLIN HFA) 108 (90 Base) MCG/ACT inhaler INHALE 2 PUFFS BY MOUTH EVERY 6 HOURS AS NEEDED FOR WHEEZE 02/01/2021: 02/01/21: Reports taking q 6 hours "every day"   allopurinol (ZYLOPRIM) 100 MG tablet TAKE 1 TABLET BY MOUTH EVERY DAY    AMBULATORY NON FORMULARY MEDICATION Medication Name: Rolling walker    amLODipine (NORVASC) 10 MG tablet TAKE 1 TABLET BY MOUTH EVERY DAY    citalopram (CELEXA) 40 MG tablet TAKE 1 TABLET BY  MOUTH EVERY DAY    clonazePAM (KLONOPIN) 0.5 MG tablet TAKE 1 TABLET (0.5 MG TOTAL) BY MOUTH 2 (TWO) TIMES DAILY AS NEEDED (VERTIGO). 02/01/2021: 02/01/21: Reports has not needed recently    Dulaglutide (TRULICITY) 8.65 HQ/4.6NG SOPN Inject 0.75 mg into the skin once a week. (Patient not taking: Reported on 02/01/2021) 02/01/2021: 02/01/21: reports can not afford and has sent paper work for PAP to [facility]: has not yet heard back- would like follow up   fexofenadine (ALLEGRA) 180 MG tablet TAKE 1 TABLET BY MOUTH EVERY DAY    fluticasone-salmeterol (WIXELA INHUB) 250-50 MCG/ACT AEPB TAKE 1 PUFF BY MOUTH TWICE A DAY    hydrALAZINE (APRESOLINE) 10 MG tablet Take 1 tablet (10 mg total) by mouth 3 (three) times daily. Please keep upcoming appt for future refills. Thank you    KLOR-CON M20 20 MEQ tablet TAKE 1 TABLET BY MOUTH TWICE A DAY    meclizine (ANTIVERT) 25 MG tablet TAKE 1 TABLET BY MOUTH 3 TIMES DAILY AS NEEDED FOR DIZZINESS. 02/01/2021: 02/01/21: reports has not needed recently    metoprolol succinate (TOPROL-XL) 100 MG 24 hr tablet TAKE 1 TABLET BY MOUTH TWICE A DAY    omeprazole (PRILOSEC) 20 MG capsule TAKE 1 CAPSULE BY MOUTH TWICE A DAY    oxybutynin (DITROPAN) 5 MG tablet Take 1 tablet (5 mg total) by mouth daily at 10 pm.    rosuvastatin (CRESTOR) 20 MG tablet TAKE 1 TABLET (20 MG TOTAL) BY MOUTH DAILY. ANNUAL APPT DUE IN [place] (Patient not taking: Reported on  02/01/2021) 02/01/2021: 02/01/21: Patient reports this medication was discontinued by PCP   telmisartan-hydrochlorothiazide (MICARDIS HCT) 80-25 MG tablet Take 1 tablet by mouth daily.    traZODone (DESYREL) 100 MG tablet TAKE 1 TABLET BY MOUTH EVERY DAY AT BEDTIME AS NEEDED FOR SLEEP    triamcinolone (NASACORT) 55 MCG/ACT AERO nasal inhaler PLACE 2 SPRAYS INTO THE NOSE DAILY.    [DISCONTINUED] pregabalin (LYRICA) 50 MG capsule Take 1 capsule (50 mg total) by mouth 3 (three) times daily. (Patient not taking: Reported on 02/01/2021)    No  facility-administered encounter medications on file as of 03/09/2021.   Patient Active Problem List   Diagnosis Date Noted   Bilateral shoulder pain 03/05/2020   Vitamin D deficiency 12/01/2019   Allergic rhinitis 06/16/2019   Asthma exacerbation 06/16/2019   Dyspnea 01/27/2019   Primary osteoarthritis of left knee 09/30/2018   CKD (chronic kidney disease) stage 3, GFR 30-59 ml/min (HCC) 07/25/2018   Chronic back pain 07/25/2018   Primary osteoarthritis of right knee 05/05/2018   Osteoarthritis of right knee 04/30/2018   Degenerative arthritis of knee, bilateral 08/07/2017   Depression with anxiety 07/19/2017   Vertigo 01/12/2017   Low back pain 01/12/2017   SOB (shortness of breath) 12/02/2016   Dysuria 09/12/2016   Bilateral knee pain 02/15/2015   Effusion of right knee 01/28/2015   Asthma with exacerbation 11/09/2014   Pronation deformity of both feet 09/29/2014   Greater trochanteric bursitis of left hip 08/31/2014   Degenerative arthritis of left knee 05/18/2014   Ventricular tachycardia, polymorphic ([place]) 01/04/2014   Abnormal MRI of head 12/04/2013   Syncope 11/06/2013   Chest pain 11/06/2013   Headache 11/06/2013   Nausea with vomiting 11/06/2013   Cough 08/28/2013   Hypokalemia 48/54/6270   Diastolic dysfunction 35/00/9381   Leukocytosis 01/03/2011   Toe pain 12/20/2010   Encounter for well adult exam with abnormal findings 12/19/2010   FATIGUE 09/05/2009   Diabetes ([place]) 11/29/2008   Gout 08/09/2008   Normochromic normocytic anemia 02/09/2008   GERD 02/09/2008   ALOPECIA 12/30/2007   Hyperlipidemia 10/01/2007   Essential hypertension 02/26/2007   COLONIC POLYPS, HX OF 02/26/2007   Goiter 09/19/2006   Asthma 09/19/2006   ECZEMA, ATOPIC DERMATITIS 09/19/2006   Insomnia 09/19/2006   Conditions to be addressed/monitored:  HTN and DMII  Care Plan : Diabetes Type 2 (Adult)  Updates made by Knox Royalty, RN since 03/09/2021 12:00 AM     Problem: Glycemic  and HTN Management (Diabetes, Type 2/ HTN)   Priority: Medium     Long-Range Goal: Glycemic/ HTN  Management Optimized   Start Date: 02/01/2021  Expected End Date: 02/01/2022  This Visit's Progress: On track  Recent Progress: On track  Priority: Medium  Note:   Objective:  Lab Results  Component Value Date   HGBA1C 5.9 12/02/2020   Lab Results  Component Value Date   CREATININE 1.38 (H) 12/02/2020   CREATININE 1.62 (H) 06/21/2020   CREATININE 1.39 (H) 12/01/2019   No results found for: EGFR Current Barriers:  Knowledge Deficits related to basic Diabetes pathophysiology and self care/management: could benefit from ongoing reinforcement of self-health management strategies for DM/ HTN Difficulty obtaining or cannot afford medications: reports has completed paper work forms for PAP for [employer])- reports has deleivered to PCP and is waiting follow up on status of forms being submitted 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 Visual limitations of (R) eye Case Manager Clinical Goal(s):  Over the  next 12 months, patient will demonstrate improved adherence to prescribed treatment plan for diabetes and HTN self care/management as evidenced by patient reporting during CCM RN CM outreach of: Daily monitoring and recording of blood sugars Weekly monitoring of blood pressures Adherence to ADA/ carb modified, low sugar, low salt diet  Adherence to prescribed medication regimen  Contacting provider for new or worsened symptoms or questions Interventions:  Collaboration with Biagio Borg, MD regarding development and update of comprehensive plan of care as evidenced by provider attestation and co-signature Inter-disciplinary care team collaboration (see longitudinal plan of care) Review of patient status, including review of consultants reports, relevant laboratory and other test results, and medications completed Discussed current clinical condition with patient;  confirmed no current clinical concerns; however, she reports her husband is currently in the hospital and states that she is at hospital with him and is unable to talk for very long: emotional support provided Denies concerns around medications: reports she has reached out to Dr. Jenny Reichmann office staff around pain medication: waiting call-back, understands staff will follow up next week; states pain is at baseline Reviewed recent blood sugars at home: reports "running pretty good;" states fasting blood sugar this morning was "114" Reports continues to monitor/ record blood pressures 2-3 times per week: reports blood pressure this morning of "139/74" Confirmed continues to follow diabetic/ heart healthy diet: positive reinforcement provided Reviewed scheduled/upcoming provider appointments including: June 01, 2021: cardiology provider; June 05, 2021: PCP Discussed plans with patient for ongoing care management follow up and provided patient with direct contact information for care management team Self-Care Activities Self administers oral medications as prescribed Independent in ADL's and iADL's Attends all scheduled provider appointments-- uses [employer] for transportation if family unable to provide Checks blood sugars and blood pressures as instructed Adheres to prescribed ADA/carb modified, low sugar, low salt diet Patient Goals: Keep checking your blood sugars at home every morning before you eat and again a couple hours after eating if it is high in the morning- be sure to write these blood sugars down- we will review these during our future phone call appointments Check blood sugar extra times if you feel sick or if you feel like it may be too high or too low The blood sugars you reported today are within normal limits Check your blood pressures several times each week, alternate taking when at rest and when you are active: write down all of your blood pressures: we will review these during our  future phone call appointments- the blood pressure you reported today is within normal limits Take the blood sugars and blood pressures you write down on paper to all doctor visits- this will help your doctors and care team know if your medications should be adjusted Continue following a heart healthy, low sugar, low-carbohydrate diet Great job taking your medications as they are prescribed Follow Up Plan:  Telephone follow up appointment with care management team member scheduled for:  Tuesday April 11, 2021 at 1:00 pm The patient has been provided with contact information for the care management team and has been advised to call with any health related questions or concerns.      Plan: Telephone follow up appointment with care management team member scheduled for:  Tuesday April 11, 2021 at 1:00 pm The patient has been provided with contact information for the care management team and has been advised to call with any health related questions or concerns  Oneta Rack, RN, BSN, [place]  [facility] [phone]: direct office [phone]: mobile

## 2021-03-09 NOTE — Telephone Encounter (Signed)
Ok I increased to 150 bid  - done erx

## 2021-03-09 NOTE — Addendum Note (Signed)
Addended by: Biagio Borg on: 03/09/2021 04:19 PM   Modules accepted: Orders

## 2021-03-10 NOTE — Telephone Encounter (Signed)
Pt notified of PCP refill. Med instructions given & pt verb understanding.

## 2021-03-13 NOTE — Telephone Encounter (Signed)
   Patient calling to report the increased dosage of Lyrica gave her uncontrollable shakes over the weekend. Patient states as a result she fell face first. Abrasions obtained, no other injury  Requesting alternative medication or decreased dosage

## 2021-03-14 MED ORDER — PREGABALIN 100 MG PO CAPS: 100.0000 mg | ORAL_CAPSULE | Freq: Two times a day (BID) | ORAL | 2 refills | Status: DC

## 2021-03-14 NOTE — Progress Notes (Addendum)
Chronic Care Management Pharmacy Note  03/15/2021 Name:  Brooke [name] MRN:  [id number] DOB:  12-Apr-1951  Summary: - Patient reports that she continues to monitor blood pressure and blood sugars at home, doing well BP ranging 114-135/61-79 on average, with blood sugars averaging 104-126 -states that she feels her mental health has improved since starting citalopram, is comfortable with current mental health, declines further interventions at this time - LDL elevated at 158, patient reports that she is no longer taking rosuvastatin, unsure as to why she stopped, denies having issues while taking medication in past -Reports that she has not yet trialed 100mg  lyrica dosing, is filled at pharmacy, just needs to pick up, currently only taking acetaminophen as needed for pain  Recommendations/Changes made from today's visit: - Patient to continue monitoring blood pressure and blood sugars, will reach out should BP >140/90 or if BG >150, continue current BG medications, will plan to continue to manage DM with diet -Advised for patient to restart rosuvastatin 20mg  daily - when previously taking LDL at goal ~60-70 -Patient to start lyrica 100mg  - will start at 100mg  daily x 1 week, then increase to 100mg  twice daily   Subjective: Brooke [name] is an 70 y.o. year old female who is a primary patient of Jenny Reichmann, Hunt Oris, MD.  The CCM team was consulted for assistance with disease management and care coordination needs.    Engaged with patient by telephone for initial visit in response to provider referral for pharmacy case management and/or care coordination services.   Consent to Services:  The patient was given the following information about Chronic Care Management services today, agreed to services, and gave verbal consent: 1. CCM service includes personalized support from designated clinical staff supervised by the primary care provider, including individualized plan of care and coordination with  other care providers 2. 24/7 contact phone numbers for assistance for urgent and routine care needs. 3. Service will only be billed when office clinical staff spend 20 minutes or more in a month to coordinate care. 4. Only one practitioner may furnish and bill the service in a calendar month. 5.The patient may stop CCM services at any time (effective at the end of the month) by phone call to the office staff. 6. The patient will be responsible for cost sharing (co-pay) of up to 20% of the service fee (after annual deductible is met). Patient agreed to services and consent obtained.  Patient Care Team: Biagio Borg, MD as PCP - General Sueanne Margarita, MD as PCP - Cardiology (Cardiology) Melrose Nakayama, MD as Consulting Physician (Orthopedic Surgery) Melissa Noon, [employer] as Referring Physician (Optometry) Knox Royalty, RN as Case Manager Tesslyn Baumert, Darnelle Maffucci, [facility] as Pharmacist (Pharmacist)  Recent office visits:  12/02/20 [name] MD(PCP)- Patient was seen for well adult exam. Labs were ordered. Patient started Dulaglutide 0.75 mg subq / stopped metformin and Pregablin 50 mg oral - 6 month follow up     10/10/20 [name] MD(Telephone)- Patient called requesting referral to Nephrology to discuss chronic kidney disease. Referral was place.   Recent consult visits:  11/08/2020 - Gean Quint (Nephrology) - notes not available    Hospital visits:  None in previous 6 months  Objective:  Lab Results  Component Value Date   CREATININE 1.38 (H) 12/02/2020   BUN 17 12/02/2020   GFR 39.22 (L) 12/02/2020   GFRNONAA 34 (L) 09/24/2018   GFRAA 39 (L) 09/24/2018   NA 141 12/02/2020  K 3.9 12/02/2020   CALCIUM 9.6 12/02/2020   CO2 23 12/02/2020   GLUCOSE 103 (H) 12/02/2020    Lab Results  Component Value Date/Time   HGBA1C 5.9 12/02/2020 10:50 AM   HGBA1C 5.9 06/21/2020 09:28 AM   GFR 39.22 (L) 12/02/2020 10:50 AM   GFR 32.46 (L) 06/21/2020 09:28 AM   MICROALBUR 0.8 12/02/2020 10:50 AM    MICROALBUR 2.9 (H) 12/01/2019 10:30 AM    Last diabetic Eye exam:  Lab Results  Component Value Date/Time   HMDIABEYEEXA No Retinopathy 12/08/2020 01:17 PM    Last diabetic Foot exam:  No results found for: HMDIABFOOTEX   Lab Results  Component Value Date   CHOL 233 (H) 12/02/2020   HDL 51.90 12/02/2020   LDLCALC 158 (H) 12/02/2020   LDLDIRECT 139.1 09/05/2009   TRIG 117.0 12/02/2020   CHOLHDL 4 12/02/2020    Hepatic Function Latest Ref Rng & Units 12/02/2020 06/21/2020 12/01/2019  Total Protein 6.0 - 8.3 g/dL 7.2 7.3 7.3  Albumin 3.5 - 5.2 g/dL 3.9 4.1 4.1  AST 0 - 37 U/L 14 14 17   ALT 0 - 35 U/L 8 8 9   Alk Phosphatase 39 - 117 U/L 93 77 70  Total Bilirubin 0.2 - 1.2 mg/dL 0.3 0.3 0.2  Bilirubin, Direct 0.0 - 0.3 mg/dL 0.1 0.1 0.1    Lab Results  Component Value Date/Time   TSH 2.17 12/02/2020 10:50 AM   TSH 2.74 12/01/2019 10:30 AM    CBC Latest Ref Rng & Units 12/02/2020 12/01/2019 01/27/2019  WBC 4.0 - 10.5 K/uL 7.8 8.0 7.0  Hemoglobin 12.0 - 15.0 g/dL 10.7(L) 11.2(L) 11.0(L)  Hematocrit 36.0 - 46.0 % 33.2(L) 34.0(L) 34.2(L)  Platelets 150.0 - 400.0 K/uL 273.0 227.0 268.0    Lab Results  Component Value Date/Time   VD25OH 46.82 12/02/2020 10:50 AM   VD25OH 57.42 06/21/2020 09:28 AM    Clinical ASCVD: No  The 10-year ASCVD risk score Mikey Bussing DC Jr., et al., 2013) is: 31.4%   Values used to calculate the score:     Age: 70 years     Sex: Female     Is Non-Hispanic African American: Yes     Diabetic: Yes     Tobacco smoker: No     Systolic Blood Pressure: 665 mmHg     Is BP treated: Yes     HDL Cholesterol: 51.9 mg/dL     Total Cholesterol: 233 mg/dL    Depression screen [facility] 2/9 03/15/2021 02/01/2021 12/02/2020  Decreased Interest 3 0 0  Down, Depressed, Hopeless 3 1 1   PHQ - 2 Score 6 1 1   Altered sleeping 0 - -  Tired, decreased energy 3 - -  Change in appetite 3 - -  Feeling bad or failure about yourself  0 - -  Trouble concentrating 0 - -  Moving  slowly or fidgety/restless 0 - -  Suicidal thoughts 0 - -  PHQ-9 Score 12 - -  Difficult doing work/chores - - -  Some recent data might be hidden    Social History   Tobacco Use  Smoking Status Never  Smokeless Tobacco Never   BP Readings from Last 3 Encounters:  12/02/20 136/74  09/29/20 140/80  06/21/20 140/70   Pulse Readings from Last 3 Encounters:  12/02/20 74  09/29/20 73  06/21/20 72   Wt Readings from Last 3 Encounters:  12/02/20 182 lb 9.6 oz (82.8 kg)  09/29/20 195 lb 12.8 oz (88.8 kg)  06/21/20 186  lb (84.4 kg)   BMI Readings from Last 3 Encounters:  12/02/20 32.35 kg/m  09/29/20 34.68 kg/m  06/21/20 32.95 kg/m    Assessment/Interventions: Review of patient past medical history, allergies, medications, health status, including review of consultants reports, laboratory and other test data, was performed as part of comprehensive evaluation and provision of chronic care management services.   SDOH:  (Social Determinants of Health) assessments and interventions performed: Yes SDOH Interventions    Flowsheet Row Most Recent Value  SDOH Interventions   Depression Interventions/Treatment  Medication, Currently on Treatment      SDOH Screenings   Alcohol Screen: Low Risk    Last Alcohol Screening Score (AUDIT): 0  Depression (PHQ2-9): Medium Risk   PHQ-2 Score: 12  Financial Resource Strain: Low Risk    Difficulty of Paying Living Expenses: Not hard at all  Food Insecurity: No Food Insecurity   Worried About Charity fundraiser in the Last Year: Never true   Ran Out of Food in the Last Year: Never true  Housing: Low Risk    Last Housing Risk Score: 0  Physical Activity: Inactive   Days of Exercise per Week: 0 days   Minutes of Exercise per Session: 0 min  Social Connections: Engineer, building services of Communication with Friends and Family: More than three times a week   Frequency of Social Gatherings with Friends and Family: More than three  times a week   Attends Religious Services: More than 4 times per year   Active Member of [employer] or Organizations: No   Attends Music therapist: More than 4 times per year   Marital Status: Married  Stress: Stress Concern Present   Feeling of Stress : Rather much  Tobacco Use: Low Risk    Smoking Tobacco Use: Never   Smokeless Tobacco Use: Never  Transportation Needs: No Transportation Needs   Lack of Transportation (Medical): No   Lack of Transportation (Non-Medical): No    CCM Care Plan  Allergies  Allergen Reactions   Metformin And Related Nausea And Vomiting   Ace Inhibitors Swelling        Codeine Nausea And Vomiting and Rash   Hydrocodone Nausea And Vomiting   Tramadol Nausea And Vomiting   Tylenol [Acetaminophen] Itching and Rash    Medications Reviewed Today     Reviewed by Biagio Borg, MD (Physician) on 03/09/21 at 93  Med List Status: <None>   Medication Order Taking? Sig Documenting Provider Last Dose Status Informant  acetaminophen (TYLENOL) 325 MG tablet [id number] No Take 650 mg by mouth every 6 (six) hours as needed for mild pain. Biagio Borg, MD Taking Active Self  albuterol (VENTOLIN HFA) 108 [phone] Base) MCG/ACT inhaler [id number] No INHALE 2 PUFFS BY MOUTH EVERY 6 HOURS AS NEEDED FOR WHEEZE Biagio Borg, MD Taking Active            Med Note Marzetta Merino Feb 01, 2021  1:16 PM) 02/01/21: Reports taking q 6 hours "every day"  allopurinol (ZYLOPRIM) 100 MG tablet [id number] No TAKE 1 TABLET BY MOUTH EVERY DAY Biagio Borg, MD Taking Active   AMBULATORY NON [facility] MEDICATION [id number] No Medication Name: Rolling walker Lyndal Pulley, DO Taking Active Self  amLODipine (NORVASC) 10 MG tablet [id number] No TAKE 1 TABLET BY MOUTH EVERY DAY Biagio Borg, MD Taking Active   citalopram (CELEXA) 40 MG tablet [id number] No TAKE 1 TABLET BY MOUTH EVERY DAY  Corwin Levins, MD Taking Active   clonazePAM Scarlette Calico) 0.5 MG tablet [id number] No TAKE  1 TABLET (0.5 MG TOTAL) BY MOUTH 2 (TWO) TIMES DAILY AS NEEDED (VERTIGO). Corwin Levins, MD Taking Active            Med Note Otelia Limes Feb 01, 2021  1:18 PM) 02/01/21: Reports has not needed recently   Dulaglutide (TRULICITY) 0.75 MG/0.5ML SOPN [id number] No Inject 0.75 mg into the skin once a week.  Patient not taking: Reported on 02/01/2021   Corwin Levins, MD Not Taking Active            Med Note Otelia Limes Feb 01, 2021  1:17 PM) 02/01/21: reports can not afford and has sent paper work for PAP to Jonny Ruiz: has not yet heard back- would like follow up  fexofenadine (ALLEGRA) 180 MG tablet [id number] No TAKE 1 TABLET BY MOUTH EVERY DAY Corwin Levins, MD Taking Active   fluticasone-salmeterol [facility] INHUB) 250-50 MCG/ACT AEPB [id number] No TAKE 1 PUFF BY MOUTH TWICE A DAY Corwin Levins, MD Taking Active   hydrALAZINE (APRESOLINE) 10 MG tablet [id number]  Take 1 tablet (10 mg total) by mouth 3 (three) times daily. Please keep upcoming appt for future refills. Thank you Quintella Reichert, MD  Active   KLOR-CON M20 20 MEQ tablet [id number] No TAKE 1 TABLET BY MOUTH TWICE A DAY Corwin Levins, MD Taking Active   meclizine (ANTIVERT) 25 MG tablet [id number] No TAKE 1 TABLET BY MOUTH 3 TIMES DAILY AS NEEDED FOR DIZZINESS. Corwin Levins, MD Taking Active            Med Note Otelia Limes Feb 01, 2021  1:20 PM) 02/01/21: reports has not needed recently   metoprolol succinate (TOPROL-XL) 100 MG 24 hr tablet [id number]  TAKE 1 TABLET BY MOUTH TWICE A DAY Corwin Levins, MD  Active   omeprazole (PRILOSEC) 20 MG capsule [id number]  TAKE 1 CAPSULE BY MOUTH TWICE A DAY Corwin Levins, MD  Active   oxybutynin (DITROPAN) 5 MG tablet [id number]  Take 1 tablet (5 mg total) by mouth daily at 10 pm. Corwin Levins, MD  Active   pregabalin (LYRICA) 150 MG capsule [id number] Yes Take 1 capsule (150 mg total) by mouth 2 (two) times daily. Corwin Levins, MD  Active   Patient not taking:  Discontinued  03/09/21 1615   rosuvastatin (CRESTOR) 20 MG tablet [id number] No TAKE 1 TABLET (20 MG TOTAL) BY MOUTH DAILY. ANNUAL APPT DUE IN [place]  Patient not taking: Reported on 02/01/2021   Corwin Levins, MD Not Taking Active            Med Note Otelia Limes Feb 01, 2021  1:22 PM) 02/01/21: Patient reports this medication was discontinued by PCP  telmisartan-hydrochlorothiazide (MICARDIS HCT) 80-25 MG tablet [id number] No Take 1 tablet by mouth daily. Corwin Levins, MD Taking Active   traZODone (DESYREL) 100 MG tablet [id number]  TAKE 1 TABLET BY MOUTH EVERY DAY AT BEDTIME AS NEEDED FOR SLEEP Corwin Levins, MD  Active   triamcinolone (NASACORT) 55 MCG/ACT AERO nasal inhaler [id number] No PLACE 2 SPRAYS INTO THE NOSE DAILY. Corwin Levins, MD Taking Active             Patient Active Problem List   Diagnosis Date Noted   Bilateral shoulder pain 03/05/2020  Vitamin D deficiency 12/01/2019   Allergic rhinitis 06/16/2019   Asthma exacerbation 06/16/2019   Dyspnea 01/27/2019   Primary osteoarthritis of left knee 09/30/2018   CKD (chronic kidney disease) stage 3, GFR 30-59 ml/min (HCC) 07/25/2018   Chronic back pain 07/25/2018   Primary osteoarthritis of right knee 05/05/2018   Osteoarthritis of right knee 04/30/2018   Degenerative arthritis of knee, bilateral 08/07/2017   Depression with anxiety 07/19/2017   Vertigo 01/12/2017   Low back pain 01/12/2017   SOB (shortness of breath) 12/02/2016   Dysuria 09/12/2016   Bilateral knee pain 02/15/2015   Effusion of right knee 01/28/2015   Asthma with exacerbation 11/09/2014   Pronation deformity of both feet 09/29/2014   Greater trochanteric bursitis of left hip 08/31/2014   Degenerative arthritis of left knee 05/18/2014   Ventricular tachycardia, polymorphic ([place]) 01/04/2014   Abnormal MRI of head 12/04/2013   Syncope 11/06/2013   Chest pain 11/06/2013   Headache 11/06/2013   Nausea with vomiting 11/06/2013   Cough 08/28/2013    Hypokalemia 51/76/1607   Diastolic dysfunction 37/04/6268   Leukocytosis 01/03/2011   Toe pain 12/20/2010   Encounter for well adult exam with abnormal findings 12/19/2010   FATIGUE 09/05/2009   Diabetes ([place]) 11/29/2008   Gout 08/09/2008   Normochromic normocytic anemia 02/09/2008   GERD 02/09/2008   ALOPECIA 12/30/2007   Hyperlipidemia 10/01/2007   Essential hypertension 02/26/2007   COLONIC POLYPS, HX OF 02/26/2007   Goiter 09/19/2006   Asthma 09/19/2006   ECZEMA, ATOPIC DERMATITIS 09/19/2006   Insomnia 09/19/2006    Immunization History  Administered Date(s) Administered   Fluad Quad(high Dose 65+) 06/21/2020   Influenza Split 04/18/2011, 04/03/2012   Influenza Whole 04/21/2008, 05/03/2009, 04/10/2010   Influenza, High Dose Seasonal PF 07/19/2017, 04/08/2018   Influenza,inj,Quad PF,6+ Mos 04/02/2013, 04/23/2014, 05/11/2015, 09/12/2016   PFIZER(Purple Top)SARS-COV-2 Vaccination 03/18/2020, 04/08/2020   Pneumococcal Conjugate-13 01/10/2017   Pneumococcal Polysaccharide-23 09/05/2009, 01/21/2018   Td 03/23/2004   Tdap 01/10/2017   Zoster, Live 08/18/2012    Conditions to be addressed/monitored:  Hypertension, Hyperlipidemia, Diabetes, GERD, Asthma, Chronic Kidney Disease, Depression, Overactive Bladder, Gout, and Allergic Rhinitis  Care Plan : CCM Care Plan  Updates made by Tomasa Blase, RPH since 03/15/2021 12:00 AM     Problem: HTN, HLD, DM2, Depression, Anxiety, Insomnia, Gout, CKD, OAB, GERD, Asthma, Allergic Rhinitis, Chronic Pain   Priority: High  Onset Date: 03/15/2021     Long-Range Goal: Disease Management   Start Date: 03/15/2021  Expected End Date: 09/15/2021  This Visit's Progress: On track  Priority: High  Note:   Current Barriers:  Unable to independently monitor therapeutic efficacy  Pharmacist Clinical Goal(s):  Patient will achieve adherence to monitoring guidelines and medication adherence to achieve therapeutic efficacy maintain control of  Blood pressure and blood sugars as evidenced by BP and BG logs  through collaboration with PharmD and provider.   Interventions: 1:1 collaboration with Biagio Borg, MD regarding development and update of comprehensive plan of care as evidenced by provider attestation and co-signature Inter-disciplinary care team collaboration (see longitudinal plan of care) Comprehensive medication review performed; medication list updated in electronic medical record  Hypertension (BP goal <130/80) -Controlled -Current treatment: Telmisartan-hydrochlorothiazide 80-25mg  - 1 tablet daily  Amlodipine 10mg  - 1 tablet daily  Hydralazine 10mg  - 1 tablet 3 times daily  Metoprolol Succinate 100mg  - 1 tablet twice daily  Potassium Chloride 50mEq - 1 tablet twice daily  Last Potassium level 3.9 mEq/L (12/02/2020) -Medications previously  tried: irbesartan, losartan, olmesartan, valsartan  BP Readings from Last 3 Encounters:  12/02/20 136/74  09/29/20 140/80  06/21/20 140/70  -Current home readings: 135/73, 133/79, 141/89, 139/72, 141/61 -Current dietary habits: reports to eating a low sodium diet  -Current exercise habits: none  -Denies hypotensive/hypertensive symptoms -Educated on BP goals and benefits of medications for prevention of heart attack, stroke and kidney damage; Daily salt intake goal < 2300 mg; Exercise goal of 150 minutes per week; Importance of home blood pressure monitoring; Proper BP monitoring technique; -Counseled to monitor BP at home daily to every other day, document, and provide log at future appointments -Counseled on diet and exercise extensively Recommended to continue current medication  Hyperlipidemia: (LDL goal < 70) -Uncontrolled Lab Results  Component Value Date   LDLCALC 158 (H) 12/02/2020  -Current treatment: Rosuvastatin 20mg  - 1 tablet daily - not currently taking  -Medications previously tried: simvastatin   -Current dietary patterns: reports to diet that at  times can consist of red meats, and fried foods  -Current exercise habits: none at this time  -Educated on Cholesterol goals;  Benefits of statin for ASCVD risk reduction; Importance of limiting foods high in cholesterol; Exercise goal of 150 minutes per week; -Counseled on diet and exercise extensively Recommended for patient to restart rosuvastatin 20mg  daily   Diabetes (A1c goal <7%) -Controlled Lab Results  Component Value Date   HGBA1C 5.9 12/02/2020  -Current medications: Diet controlled  -Medications previously tried: metformin   -Current home glucose readings fasting glucose: 126, 122, 116, 104, 113, 109, 112, 111, 126, 119, 109, 104, 107 -Denies hypoglycemic/hyperglycemic symptoms -Current meal patterns:  breakfast: eggs, bacon, grits, hashbrown  lunch: does not typically eat lunch  dinner: baked chicken/ pork chop/ steak, potato salad, salad, green beans, cabbage, greens  snacks: lightly salted chips  drinks: water  -Current exercise: none  -Educated on A1c and blood sugar goals; Complications of diabetes including kidney damage, retinal damage, and cardiovascular disease; Exercise goal of 150 minutes per week; Benefits of weight loss; Benefits of routine self-monitoring of blood sugar; -Counseled to check feet daily and get yearly eye exams -Counseled on diet and exercise extensively  Asthma / Allergic Rhinitis(Goal: control symptoms and prevent exacerbations) -Controlled -Current treatment  Wixela 250-52mcg - 1 puff twice daily  Albuterol 174mcg/act - 2 puffs every 6 hours as needed - typically only needs to use once daily  Fexofenadine 180mg  - 1 tablet daily  Triamcinolone 9mcg/act nasal inhaler - 2 sprays into each nostril daily  -Medications previously tried: beclomethasone, flovent, montelukast  -Exacerbations requiring treatment in last 6 months: none -Patient reports consistent use of maintenance inhaler -Frequency of rescue inhaler use: once daily - at  most will use twice daily -Counseled on Proper inhaler technique; Benefits of consistent maintenance inhaler use When to use rescue inhaler Differences between maintenance and rescue inhalers -Recommended to continue current medication  Depression with anxiety / Insomnia(Goal: Promotion of positive mood/ quality sleep) -Not ideally controlled -Current treatment: Citalopram 40mg  - 1 tablet daily  Clonazepam 0.5mg  - 1 tablet twice daily as needed - has not used recently  Trazodone 100mg  - 1 tablet at bedtime  -Medications previously tried/failed: alprazolam, zolpidem -PHQ9: 12 -Offered to connect patient with psych provider for mental health support - declined at this time, feels that since she has started citalopram that depression / mood has improved  -Educated on Benefits of medication for symptom control Benefits of cognitive-behavioral therapy with or without medication -Recommended to continue current  medication  Gout (Goal: Prevention of gout flare) -Controlled -Current treatment  Allopurinol $RemoveBefo'100mg'xlowcosCOoq$  - 1 tablet daily  -Medications previously tried: n/a  -Recommended to continue current medication  OAB (Goal: Maintenance of appropriate urinary frequency) -Controlled -Current treatment  Oxybutynin $RemoveBef'5mg'MVBvalvstl$  - 1 tablet daily  -Medications previously tried: n/a  -Recommended to continue current medication  GERD (Goal: Prevention of acid reflux) -Controlled -Current treatment  Omeprazole $RemoveBef'20mg'RNPJEiyfmv$  - 1 capsule twice daily  -Medications previously tried: n/a  -Counseled on diet and exercise extensively Recommended to continue current medication  Chronic pain / chronic back pain / OA of knees  (Goal: pain control) -Not ideally controlled -Current treatment  Lyrica $Remov'100mg'ujAdaU$  - 1 capsule twice daily - not currently taking, but does plan on starting  Acetaminophen - $RemoveBeforeD'650mg'qAvwWaLPdlftJZ$  every 6 hours as needed  -Medications previously tried: gabapentin, tizanidine, ibuprofen, norco, tramadol -Counseled on  avoiding use of oral NSAIDs, patient reports that she has not started lyrica $RemoveBefore'100mg'IFNiEbkNvlqAh$  dosing because she has not picked up from pharmacy - did not feel $Remove'50mg'YQObnPS$  was very effective for her back pain she she describes as a chronic sharp radiating pain - $Remo'150mg'keUiB$  cause her to shake and led to a fall, plans to trial $Remove'100mg'nSYdjeF$  dose - advised to titrate up to $Rem'100mg'LnbU$  twice daily - patient to start lyrica $RemoveBefo'100mg'gfiPoltQPxC$  once daily x 1 week, then increase to twice daily dosing   Chronic Kidney Disease (Goal: Prevention of disease progression) -Controlled -Last eGFR: 39.22 mL/min (12/02/2020) -Last CrCl: 50.29mL/min  -Current treatment  Avoidance of nephrotoxic agents / adequate blood pressure and blood sugar control to prevent kidney damage -Recommended to continue current medication  Health Maintenance -Vaccine gaps: shingles, COVID booster, influenza vaccine -Current therapy:  Meclizine $RemoveBe'25mg'yXamYYZhe$  - 1 tablet 3 times daily as needed - has not taken in a number of months -Recommended to continue current medication  Patient Goals/Self-Care Activities Patient will:  - take medications as prescribed check glucose at least every other day, document, and provide at future appointments check blood pressure at least every other day, document, and provide at future appointments -Patient to start to engage in exercise/ activity as she is able to help improve BP, cholesterol, and BG control  Follow Up Plan: Telephone follow up appointment with care management team member scheduled for: The patient has been provided with contact information for the care management team and has been advised to call with any health related questions or concerns.         Medication Assistance:  Patient reports that no assistance is required at this time as it has been decided by PCP that she will not start trulicity   Patient's preferred pharmacy is:  [id number] - [place], [place] [address] Phone: [phone] Fax: [phone]   Uses pill box? Yes Pt endorses 100% compliance  Care Plan and Follow Up Patient Decision:  Patient agrees to Care Plan and Follow-up.  Plan: Telephone follow up appointment with care management team member scheduled for:  3 months and The patient has been provided with contact information for the care management team and has been advised to call with any health related questions or concerns.   Tomasa Blase, PharmD Clinical Pharmacist, [place]

## 2021-03-14 NOTE — Telephone Encounter (Signed)
Ok to try the lower dose 100 mg bid - done erx

## 2021-03-14 NOTE — Telephone Encounter (Signed)
Pt notified that new prescription has been ordered. Pt states she will keep trying the '50mg'$  as the '150mg'$  made her start shaking.   Pt advised to call office if any additional ques/concerns.

## 2021-03-14 NOTE — Addendum Note (Signed)
Addended by: Biagio Borg on: 03/14/2021 01:10 PM   Modules accepted: Orders

## 2021-03-15 ENCOUNTER — Ambulatory Visit: Payer: Medicare Other

## 2021-03-15 ENCOUNTER — Other Ambulatory Visit: Payer: Self-pay

## 2021-03-15 DIAGNOSIS — E7800 Pure hypercholesterolemia, unspecified: Secondary | ICD-10-CM

## 2021-03-15 DIAGNOSIS — E119 Type 2 diabetes mellitus without complications: Secondary | ICD-10-CM | POA: Diagnosis not present

## 2021-03-15 DIAGNOSIS — M109 Gout, unspecified: Secondary | ICD-10-CM

## 2021-03-15 DIAGNOSIS — I10 Essential (primary) hypertension: Secondary | ICD-10-CM | POA: Diagnosis not present

## 2021-03-15 DIAGNOSIS — K219 Gastro-esophageal reflux disease without esophagitis: Secondary | ICD-10-CM

## 2021-03-15 DIAGNOSIS — N1831 Chronic kidney disease, stage 3a: Secondary | ICD-10-CM | POA: Diagnosis not present

## 2021-03-15 DIAGNOSIS — J309 Allergic rhinitis, unspecified: Secondary | ICD-10-CM

## 2021-03-15 NOTE — Patient Instructions (Signed)
Visit Information   PATIENT GOALS:   Goals Addressed             This Visit's Progress    Manage Chronic Pain       Timeframe:  Long-Range Goal Priority:  Medium Start Date:   03/15/2021                          Expected End Date:   09/15/2021                    Follow Up Date 06/15/2021   - call for medicine refill 2 or 3 days before it runs out - keep track of prescription refills - plan exercise or activity when pain is best controlled - prioritize tasks for the day - track times pain is worst and when it is best - track what makes the pain worse and what makes it better - use ice or heat for pain relief - work slower and less intense when having pain    Why is this important?   Day-to-day life can be hard when you have chronic pain.  Pain medicine is just one piece of the treatment puzzle.  You can try these action steps to help you manage your pain.       Manage My Medicine       Timeframe:  Long-Range Goal Priority:  High Start Date:   03/15/2021                          Expected End Date:  09/15/2021                     Follow Up Date 06/15/2021   - call for medicine refill 2 or 3 days before it runs out - call if I am sick and can't take my medicine - keep a list of all the medicines I take; vitamins and herbals too - learn to read medicine labels - use a pillbox to sort medicine    Why is this important?   These steps will help you keep on track with your medicines.     Monitor and Manage My Blood Sugar and Blood pressure-Diabetes II and Hypertension   On track    Timeframe:  Long-Range Goal Priority:  Medium Start Date:           02/01/21                  Expected End Date:     02/01/22                  Follow Up Date 03/09/2021    Keep checking your blood sugars at home every morning before you eat and again a couple hours after eating if it is high in the morning- be sure to write these blood sugars down- we will review these during our future phone  call appointments Check blood sugar extra times if you feel sick or if you feel like it may be too high or too low The blood sugars you reported today are within normal limits Check your blood pressures several times each week, alternate taking when at rest and when you are active: write down all of your blood pressures: we will review these during our future phone call appointments- the blood pressure you reported today is within normal limits Take the blood sugars and blood pressures you write   down on paper to all doctor visits- this will help your doctors and care team know if your medications should be adjusted Continue following a heart healthy, low sugar, low-carbohydrate diet Great job taking your medications as they are prescribed   Why is this important?   Checking your blood sugar at home helps to keep it from getting very high or very low.  Writing the results in a diary or log helps the doctor know how to care for you.  Your blood sugar log should have the time, the date and the results.  Also, write down the amount of insulin or other medicine you take.  Other information like what you ate, exercise done and how you were feeling will also be helpful..             Consent to CCM Services: Brooke [name] was given information about Chronic Care Management services including:  CCM service includes personalized support from designated clinical staff supervised by her physician, including individualized plan of care and coordination with other care providers 24/7 contact phone numbers for assistance for urgent and routine care needs. Service will only be billed when office clinical staff spend 20 minutes or more in a month to coordinate care. Only one practitioner may furnish and bill the service in a calendar month. The patient may stop CCM services at any time (effective at the end of the month) by phone call to the office staff. The patient will be responsible for cost sharing  (co-pay) of up to 20% of the service fee (after annual deductible is met).  Patient agreed to services and verbal consent obtained.   The patient verbalized understanding of instructions, educational materials, and care plan provided today and declined offer to receive copy of patient instructions, educational materials, and care plan.   Telephone follow up appointment with care management team member scheduled for: 3 months The patient has been provided with contact information for the care management team and has been advised to call with any health related questions or concerns.   Tomasa Blase, PharmD Clinical Pharmacist, [facility]    CLINICAL CARE PLAN:   Patient Care Plan: CCM Care Plan     Problem Identified: HTN, HLD, DM2, Depression, Anxiety, Insomnia, Gout, CKD, OAB, GERD, Asthma, Allergic Rhinitis, Chronic Pain   Priority: High  Onset Date: 03/15/2021     Long-Range Goal: Disease Management   Start Date: 03/15/2021  Expected End Date: 09/15/2021  This Visit's Progress: On track  Priority: High  Note:   Current Barriers:  Unable to independently monitor therapeutic efficacy  Pharmacist Clinical Goal(s):  Patient will achieve adherence to monitoring guidelines and medication adherence to achieve therapeutic efficacy maintain control of Blood pressure and blood sugars as evidenced by BP and BG logs  through collaboration with PharmD and provider.   Interventions: 1:1 collaboration with Biagio Borg, MD regarding development and update of comprehensive plan of care as evidenced by provider attestation and co-signature Inter-disciplinary care team collaboration (see longitudinal plan of care) Comprehensive medication review performed; medication list updated in electronic medical record  Hypertension (BP goal <130/80) -Controlled -Current treatment: Telmisartan-hydrochlorothiazide 80-67m - 1 tablet daily  Amlodipine 138m- 1 tablet daily  Hydralazine  1049m 1 tablet 3 times daily  Metoprolol Succinate 100m63m1 tablet twice daily  Potassium Chloride 20mE29m1 tablet twice daily  Last Potassium level 3.9 mEq/L (12/02/2020) -Medications previously tried: irbesartan, losartan, olmesartan, valsartan  BP Readings from Last 3  Encounters:  12/02/20 136/74  09/29/20 140/80  06/21/20 140/70  -Current home readings: 135/73, 133/79, 141/89, 139/72, 141/61 -Current dietary habits: reports to eating a low sodium diet  -Current exercise habits: none  -Denies hypotensive/hypertensive symptoms -Educated on BP goals and benefits of medications for prevention of heart attack, stroke and kidney damage; Daily salt intake goal < 2300 mg; Exercise goal of 150 minutes per week; Importance of home blood pressure monitoring; Proper BP monitoring technique; -Counseled to monitor BP at home daily to every other day, document, and provide log at future appointments -Counseled on diet and exercise extensively Recommended to continue current medication  Hyperlipidemia: (LDL goal < 70) -Uncontrolled Lab Results  Component Value Date   LDLCALC 158 (H) 12/02/2020  -Current treatment: Rosuvastatin 20mg - 1 tablet daily - not currently taking  -Medications previously tried: simvastatin   -Current dietary patterns: reports to diet that at times can consist of red meats, and fried foods  -Current exercise habits: none at this time  -Educated on Cholesterol goals;  Benefits of statin for ASCVD risk reduction; Importance of limiting foods high in cholesterol; Exercise goal of 150 minutes per week; -Counseled on diet and exercise extensively Recommended for patient to restart rosuvastatin 20mg daily   Diabetes (A1c goal <7%) -Controlled Lab Results  Component Value Date   HGBA1C 5.9 12/02/2020  -Current medications: Diet controlled  -Medications previously tried: metformin   -Current home glucose readings fasting glucose: 126, 122, 116, 104, 113, 109,  112, 111, 126, 119, 109, 104, 107 -Denies hypoglycemic/hyperglycemic symptoms -Current meal patterns:  breakfast: eggs, bacon, grits, hashbrown  lunch: does not typically eat lunch  dinner: baked chicken/ pork chop/ steak, potato salad, salad, green beans, cabbage, greens  snacks: lightly salted chips  drinks: water  -Current exercise: none  -Educated on A1c and blood sugar goals; Complications of diabetes including kidney damage, retinal damage, and cardiovascular disease; Exercise goal of 150 minutes per week; Benefits of weight loss; Benefits of routine self-monitoring of blood sugar; -Counseled to check feet daily and get yearly eye exams -Counseled on diet and exercise extensively  Asthma / Allergic Rhinitis(Goal: control symptoms and prevent exacerbations) -Controlled -Current treatment  Wixela 250-50mcg - 1 puff twice daily  Albuterol 108mcg/act - 2 puffs every 6 hours as needed - typically only needs to use once daily  Fexofenadine 180mg - 1 tablet daily  Triamcinolone 55mcg/act nasal inhaler - 2 sprays into each nostril daily  -Medications previously tried: beclomethasone, flovent, montelukast  -Exacerbations requiring treatment in last 6 months: none -Patient reports consistent use of maintenance inhaler -Frequency of rescue inhaler use: once daily - at most will use twice daily -Counseled on Proper inhaler technique; Benefits of consistent maintenance inhaler use When to use rescue inhaler Differences between maintenance and rescue inhalers -Recommended to continue current medication  Depression with anxiety / Insomnia(Goal: Promotion of positive mood/ quality sleep) -Not ideally controlled -Current treatment: Citalopram 40mg - 1 tablet daily  Clonazepam 0.5mg - 1 tablet twice daily as needed - has not used recently  Trazodone 100mg - 1 tablet at bedtime  -Medications previously tried/failed: alprazolam, zolpidem -PHQ9: 12 -Offered to connect patient with psych  provider for mental health support - declined at this time, feels that since she has started citalopram that depression / mood has improved  -Educated on Benefits of medication for symptom control Benefits of cognitive-behavioral therapy with or without medication -Recommended to continue current medication  Gout (Goal: Prevention of gout flare) -Controlled -Current treatment    Allopurinol 15m - 1 tablet daily  -Medications previously tried: n/a  -Recommended to continue current medication  OAB (Goal: Maintenance of appropriate urinary frequency) -Controlled -Current treatment  Oxybutynin 552m- 1 tablet daily  -Medications previously tried: n/a  -Recommended to continue current medication  GERD (Goal: Prevention of acid reflux) -Controlled -Current treatment  Omeprazole 2035m 1 capsule twice daily  -Medications previously tried: n/a  -Counseled on diet and exercise extensively Recommended to continue current medication  Chronic pain / chronic back pain / OA of knees  (Goal: pain control) -Not ideally controlled -Current treatment  Lyrica 100m52m1 capsule twice daily - not currently taking, but does plan on starting  Acetaminophen - 650mg17mry 6 hours as needed  -Medications previously tried: gabapentin, tizanidine, ibuprofen, norco, tramadol -Counseled on avoiding use of oral NSAIDs, patient reports that she has not started lyrica 100mg 31mng because she has not picked up from pharmacy - did not feel 50mg w105mery effective for her back pain she she describes as a chronic sharp radiating pain - 150mg ca42mher to shake and led to a fall, plans to trial 100mg dos18madvised to titrate up to 100mg twic79mily - patient to start lyrica 100mg once 76my x 1 week, then increase to twice daily dosing   Chronic Kidney Disease (Goal: Prevention of disease progression) -Controlled -Last eGFR: 39.22 mL/min (12/02/2020) -Last CrCl: 50.16mL/min  -43ment treatment  Avoidance of  nephrotoxic agents / adequate blood pressure and blood sugar control to prevent kidney damage -Recommended to continue current medication  Health Maintenance -Vaccine gaps: shingles, COVID booster, influenza vaccine -Current therapy:  Meclizine 25mg - 1 tab18m3 times daily as needed - has not taken in a number of months -Recommended to continue current medication  Patient Goals/Self-Care Activities Patient will:  - take medications as prescribed check glucose at least every other day, document, and provide at future appointments check blood pressure at least every other day, document, and provide at future appointments -Patient to start to engage in exercise/ activity as she is able to help improve BP, cholesterol, and BG control  Follow Up Plan: Telephone follow up appointment with care management team member scheduled for: 3 months The patient has been provided with contact information for the care management team and has been advised to call with any health related questions or concerns.

## 2021-03-30 DIAGNOSIS — E119 Type 2 diabetes mellitus without complications: Secondary | ICD-10-CM | POA: Diagnosis not present

## 2021-04-11 ENCOUNTER — Ambulatory Visit (INDEPENDENT_AMBULATORY_CARE_PROVIDER_SITE_OTHER): Payer: Medicare Other | Admitting: *Deleted

## 2021-04-11 DIAGNOSIS — E119 Type 2 diabetes mellitus without complications: Secondary | ICD-10-CM

## 2021-04-11 DIAGNOSIS — I10 Essential (primary) hypertension: Secondary | ICD-10-CM

## 2021-04-11 NOTE — Patient Instructions (Signed)
Visit [place], it was nice talking with you today.   I look forward to talking to you again for an update on Tuesday July 11, 2021 at 1:00 pm- please be listening out for my call that day.  I will call as close to 1:00 pm as possible.   If you need to cancel or re-schedule our telephone visit, please call [phone] and one of our care guides will be happy to assist you.   I look forward to hearing about your progress.   Please don't hesitate to contact me if I can be of assistance to you before our next scheduled telephone appointment.   Oneta Rack, RN, BSN, [facility] RN Care Coordination- [place] [phone]: direct office [phone]: mobile   PATIENT GOALS:  Goals Addressed             This Visit's Progress    Monitor and Manage My Blood Sugar and Blood pressure-Diabetes II and Hypertension   On track    Timeframe:  Long-Range Goal Priority:  Medium Start Date:           02/01/21                  Expected End Date:     02/01/22                  Follow Up Date 07/11/2021    Keep checking your blood sugars at home every morning before you eat and again a couple hours after eating if it is high in the morning- be sure to write these blood sugars down- we will review these during our future phone call appointments Check blood sugar extra times if you feel sick or if you feel like it may be too high or too low The blood sugars you reported today are within normal limits-- keep up the great work following a low carbohydrate/ low- sugar/ low salt diet Check your blood pressures several times each week, alternate taking when at rest and when you are active: write down all of your blood pressures: we will review these during our future phone call appointments- the blood pressure you reported today is within normal limits Take the blood sugars and blood pressures you write down on paper to all doctor visits- this will help your  doctors and care team know if your medications should be adjusted Continue following a heart healthy, low salt, low sugar, low-carbohydrate diet Keep taking your medications as they are prescribed- if you have questions about your medications, please let your doctor's/ care team know   Why is this important?   Checking your blood sugar at home helps to keep it from getting very high or very low.  Writing the results in a diary or log helps the doctor know how to care for you.  Your blood sugar log should have the time, the date and the results.  Also, write down the amount of insulin or other medicine you take.  Other information like what you ate, exercise done and how you were feeling will also be helpful..            Influenza (Flu) Vaccine (Inactivated or Recombinant): What You Need to Know 1. Why get vaccinated? Influenza vaccine can prevent influenza (flu). Flu is a contagious disease that spreads around the [place] every year, usually between October and May. Anyone can get the flu, but it is more dangerous for some people. Infants  and young children, people 99 years and older, pregnant people, and people with certain health conditions or a weakened immune system are at greatest risk of flu complications. Pneumonia, bronchitis, sinus infections, and ear infections are examples of flu-related complications. If you have a medical condition, such as heart disease, cancer, or diabetes, flu can make it worse. Flu can cause fever and chills, sore throat, muscle aches, fatigue, cough, headache, and runny or stuffy nose. Some people may have vomiting and diarrhea, though this is more common in children than adults. In an average year, thousands of people in the [place] die from flu, and many more are hospitalized. Flu vaccine prevents millions of illnesses and flu-related visits to the doctor each year. 2. Influenza vaccines CDC recommends everyone 6 months and older get vaccinated  every flu season. Children 6 months through 104 years of age may need 2 doses during a single flu season. Everyone else needs only 1 dose each flu season. It takes about 2 weeks for protection to develop after vaccination. There are many flu viruses, and they are always changing. Each year a new flu vaccine is made to protect against the influenza viruses believed to be likely to cause disease in the upcoming flu season. Even when the vaccine doesn't exactly match these viruses, it may still provide some protection. Influenza vaccine does not cause flu. Influenza vaccine may be given at the same time as other vaccines. 3. Talk with your health care provider Tell your vaccination provider if the person getting the vaccine: Has had an allergic reaction after a previous dose of influenza vaccine, or has any severe, life-threatening allergies Has ever had Guillain-Barr Syndrome (also called "GBS") In some cases, your health care provider may decide to postpone influenza vaccination until a future visit. Influenza vaccine can be administered at any time during pregnancy. People who are or will be pregnant during influenza season should receive inactivated influenza vaccine. People with minor illnesses, such as a cold, may be vaccinated. People who are moderately or severely ill should usually wait until they recover before getting influenza vaccine. Your health care provider can give you more information. 4. Risks of a vaccine reaction Soreness, redness, and swelling where the shot is given, fever, muscle aches, and headache can happen after influenza vaccination. There may be a very small increased risk of Guillain-Barr Syndrome (GBS) after inactivated influenza vaccine (the flu shot). Young children who get the flu shot along with pneumococcal vaccine (PCV13) and/or DTaP vaccine at the same time might be slightly more likely to have a seizure caused by fever. Tell your health care provider if a child  who is getting flu vaccine has ever had a seizure. People sometimes faint after medical procedures, including vaccination. Tell your provider if you feel dizzy or have vision changes or ringing in the ears. As with any medicine, there is a very remote chance of a vaccine causing a severe allergic reaction, other serious injury, or death. 5. What if there is a serious problem? An allergic reaction could occur after the vaccinated person leaves the clinic. If you see signs of a severe allergic reaction (hives, swelling of the face and throat, difficulty breathing, a fast heartbeat, dizziness, or weakness), call 9-1-1 and get the person to the nearest hospital. For other signs that concern you, call your health care provider. Adverse reactions should be reported to the Vaccine Adverse Event Reporting System (VAERS). Your health care provider will usually file this report, or you can  do it yourself. Visit the VAERS website at www.vaers.SamedayNews.es or call (417)804-0919. VAERS is only for reporting reactions, and VAERS staff members do not give medical advice. 6. The National Vaccine Injury Compensation Program The [employer] Vaccine Injury Compensation Program (VICP) is a federal program that was created to compensate people who may have been injured by certain vaccines. Claims regarding alleged injury or death due to vaccination have a time limit for filing, which may be as short as two years. Visit the VICP website at GoldCloset.com.ee or call 951-229-0104 to learn about the program and about filing a claim. 7. How can I learn more? Ask your health care provider. Call your local or state health department. Visit the website of the Food and Drug Administration (FDA) for vaccine package inserts and additional information at TraderRating.uy. Contact the Centers for Disease Control and Prevention (CDC): Call 878 754 6752 (1-800-CDC-INFO) or Visit CDC's website at  https://gibson.com/. Vaccine Information Statement Inactivated Influenza Vaccine (02/26/2020) This information is not intended to replace advice given to you by your health care provider. Make sure you discuss any questions you have with your health care provider. Document Revised: 04/14/2020 Document Reviewed: 04/14/2020 Elsevier Patient Education  [place].   The patient verbalized understanding of instructions, educational materials, and care plan provided today and agreed to receive a mailed copy of patient instructions, educational materials, and care plan Telephone follow up appointment with care management team member scheduled for:  Tuesday July 11, 2021 at 1:00 pm The patient has been provided with contact information for the care management team and has been advised to call with any health related questions or concerns  Oneta Rack, RN, BSN, [place] [phone]: direct office [phone]: mobile

## 2021-04-11 NOTE — Chronic Care Management (AMB) (Signed)
Chronic Care Management   CCM RN Visit Note  04/11/2021 Name: Brooke [name] MRN: [id number] DOB: 08-05-1950  Subjective: Brooke [name] is a 70 y.o. year old female who is a primary care patient of Biagio Borg, MD. The care management team was consulted for assistance with disease management and care coordination needs.    Engaged with patient by telephone for follow up visit in response to provider referral for case management and/or care coordination services.   Consent to Services:  The patient was given information about Chronic Care Management services, agreed to services, and gave verbal consent prior to initiation of services.  Please see initial visit note for detailed documentation.  Patient agreed to services and verbal consent obtained.   Assessment: Review of patient past medical history, allergies, medications, health status, including review of consultants reports, laboratory and other test data, was performed as part of comprehensive evaluation and provision of chronic care management services.  CCM Care Plan  Allergies  Allergen Reactions   Metformin And Related Nausea And Vomiting   Ace Inhibitors Swelling        Codeine Nausea And Vomiting and Rash   Hydrocodone Nausea And Vomiting   Tramadol Nausea And Vomiting   Tylenol [Acetaminophen] Itching and Rash   Outpatient Encounter Medications as of 04/11/2021  Medication Sig Note   acetaminophen (TYLENOL) 325 MG tablet Take 650 mg by mouth every 6 (six) hours as needed for mild pain.    albuterol (VENTOLIN HFA) 108 (90 Base) MCG/ACT inhaler INHALE 2 PUFFS BY MOUTH EVERY 6 HOURS AS NEEDED FOR WHEEZE 02/01/2021: 02/01/21: Reports taking q 6 hours "every day"   allopurinol (ZYLOPRIM) 100 MG tablet TAKE 1 TABLET BY MOUTH EVERY DAY    AMBULATORY NON FORMULARY MEDICATION Medication Name: Rolling walker    amLODipine (NORVASC) 10 MG tablet TAKE 1 TABLET BY MOUTH EVERY DAY    citalopram (CELEXA) 40 MG tablet TAKE 1 TABLET BY  MOUTH EVERY DAY    clonazePAM (KLONOPIN) 0.5 MG tablet TAKE 1 TABLET (0.5 MG TOTAL) BY MOUTH 2 (TWO) TIMES DAILY AS NEEDED (VERTIGO). (Patient not taking: Reported on 03/15/2021) 02/01/2021: 02/01/21: Reports has not needed recently    fexofenadine (ALLEGRA) 180 MG tablet TAKE 1 TABLET BY MOUTH EVERY DAY    fluticasone-salmeterol (WIXELA INHUB) 250-50 MCG/ACT AEPB TAKE 1 PUFF BY MOUTH TWICE A DAY    hydrALAZINE (APRESOLINE) 10 MG tablet Take 1 tablet (10 mg total) by mouth 3 (three) times daily. Please keep upcoming appt for future refills. Thank you    KLOR-CON M20 20 MEQ tablet TAKE 1 TABLET BY MOUTH TWICE A DAY    meclizine (ANTIVERT) 25 MG tablet TAKE 1 TABLET BY MOUTH 3 TIMES DAILY AS NEEDED FOR DIZZINESS. (Patient not taking: Reported on 03/15/2021) 02/01/2021: 02/01/21: reports has not needed recently    metoprolol succinate (TOPROL-XL) 100 MG 24 hr tablet TAKE 1 TABLET BY MOUTH TWICE A DAY    omeprazole (PRILOSEC) 20 MG capsule TAKE 1 CAPSULE BY MOUTH TWICE A DAY    oxybutynin (DITROPAN) 5 MG tablet Take 1 tablet (5 mg total) by mouth daily at 10 pm.    pregabalin (LYRICA) 100 MG capsule Take 1 capsule (100 mg total) by mouth 2 (two) times daily. (Patient not taking: Reported on 03/15/2021)    rosuvastatin (CRESTOR) 20 MG tablet TAKE 1 TABLET (20 MG TOTAL) BY MOUTH DAILY. ANNUAL APPT DUE IN [place] (Patient not taking: No sig reported) 02/01/2021: 02/01/21: Patient reports this medication  was discontinued by PCP   telmisartan-hydrochlorothiazide (MICARDIS HCT) 80-25 MG tablet Take 1 tablet by mouth daily.    traZODone (DESYREL) 100 MG tablet TAKE 1 TABLET BY MOUTH EVERY DAY AT BEDTIME AS NEEDED FOR SLEEP    triamcinolone (NASACORT) 55 MCG/ACT AERO nasal inhaler PLACE 2 SPRAYS INTO THE NOSE DAILY.    No facility-administered encounter medications on file as of 04/11/2021.   Patient Active Problem List   Diagnosis Date Noted   Bilateral shoulder pain 03/05/2020   Vitamin D deficiency 12/01/2019    Allergic rhinitis 06/16/2019   Asthma exacerbation 06/16/2019   Dyspnea 01/27/2019   Primary osteoarthritis of left knee 09/30/2018   CKD (chronic kidney disease) stage 3, GFR 30-59 ml/min (HCC) 07/25/2018   Chronic back pain 07/25/2018   Primary osteoarthritis of right knee 05/05/2018   Osteoarthritis of right knee 04/30/2018   Degenerative arthritis of knee, bilateral 08/07/2017   Depression with anxiety 07/19/2017   Vertigo 01/12/2017   Low back pain 01/12/2017   SOB (shortness of breath) 12/02/2016   Dysuria 09/12/2016   Bilateral knee pain 02/15/2015   Effusion of right knee 01/28/2015   Asthma with exacerbation 11/09/2014   Pronation deformity of both feet 09/29/2014   Greater trochanteric bursitis of left hip 08/31/2014   Degenerative arthritis of left knee 05/18/2014   Ventricular tachycardia, polymorphic ([place]) 01/04/2014   Abnormal MRI of head 12/04/2013   Syncope 11/06/2013   Chest pain 11/06/2013   Headache 11/06/2013   Nausea with vomiting 11/06/2013   Cough 08/28/2013   Hypokalemia 36/62/9476   Diastolic dysfunction 54/65/0354   Leukocytosis 01/03/2011   Toe pain 12/20/2010   Encounter for well adult exam with abnormal findings 12/19/2010   FATIGUE 09/05/2009   Diabetes ([place]) 11/29/2008   Gout 08/09/2008   Normochromic normocytic anemia 02/09/2008   GERD 02/09/2008   ALOPECIA 12/30/2007   Hyperlipidemia 10/01/2007   Essential hypertension 02/26/2007   COLONIC POLYPS, HX OF 02/26/2007   Goiter 09/19/2006   Asthma 09/19/2006   ECZEMA, ATOPIC DERMATITIS 09/19/2006   Insomnia 09/19/2006   Conditions to be addressed/monitored:  HTN and DMII  Care Plan : Diabetes Type 2 (Adult)  Updates made by Knox Royalty, RN since 04/11/2021 12:00 AM     Problem: Glycemic and HTN Management (Diabetes, Type 2/ HTN)   Priority: Medium     Long-Range Goal: Glycemic/ HTN  Management Optimized   Start Date: 02/01/2021  Expected End Date: 02/01/2022  This Visit's  Progress: On track  Recent Progress: On track  Priority: Medium  Note:   Objective:  Lab Results  Component Value Date   HGBA1C 5.9 12/02/2020   Lab Results  Component Value Date   CREATININE 1.38 (H) 12/02/2020   CREATININE 1.62 (H) 06/21/2020   CREATININE 1.39 (H) 12/01/2019   No results found for: EGFR Current Barriers:  Knowledge Deficits related to basic Diabetes pathophysiology and self care/management: could benefit from ongoing reinforcement of self-health management strategies for DM/ HTN Difficulty obtaining or cannot afford medications: reports has completed paper work forms for PAP for [employer])- reports has deleivered to PCP and is waiting follow up on status of forms being submitted 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 [place] Visual limitations of (R) eye Case Manager Clinical Goal(s):  02/01/21: Over the next 12 months, patient will demonstrate improved adherence to prescribed treatment plan for diabetes and HTN self care/management as evidenced by patient reporting during CCM RN CM outreach of: Daily monitoring and recording  of blood sugars Weekly monitoring of blood pressures Adherence to ADA/ carb modified, low sugar, low salt diet  Adherence to prescribed medication regimen  Contacting provider for new or worsened symptoms or questions Interventions:  Collaboration with Biagio Borg, MD regarding development and update of comprehensive plan of care as evidenced by provider attestation and co-signature Inter-disciplinary care team collaboration (see longitudinal plan of care) Review of patient status, including review of consultants reports, relevant laboratory and other test results, and medications completed Discussed current clinical condition with patient; confirmed no current clinical concerns; reports her husband is now home from hospital; denies issues/ concerns with care-giving; states "things are going okay and I am feeling and  doing fine" Confirms she has spoken to [facility] Pharmacist; appreciative of help, denies medication concerns today, reports taking all as prescribed per her conversation with CCM Pharmacist Dan Reviewed recent blood sugars at home: continues to monitor fasting only, unless she has very high/ low values: denies today; reports consistent fasting blood sugars between 83-149, with value of 120 this morning Continues to eat "like I should;" but admits to ongoing decreased appetite- denies signs/ symptoms hypoglycemia Continues monitoring blood pressures at home daily: reports consistent values between 115-145/ 60-80, with BP reported today as "132/67" Reviewed scheduled/upcoming provider appointments including: June 01, 2021: cardiology provider; June 05, 2021: PCP; June 16, 2021- CCM Pharmacist Reminded patient that it is approaching flu season- encouraged her to obtain 2022 flu shot; she states she will do Discussed plans with patient for ongoing care management follow up and provided patient with direct contact information for care management team Self-Care Activities Self administers oral medications as prescribed Independent in ADL's and iADL's Attends all scheduled provider appointments-- uses [employer] for transportation if family unable to provide Checks blood sugars and blood pressures as instructed Adheres to prescribed ADA/carb modified, low sugar, low salt diet Patient Goals: Keep checking your blood sugars at home every morning before you eat and again a couple hours after eating if it is high in the morning- be sure to write these blood sugars down- we will review these during our future phone call appointments Check blood sugar extra times if you feel sick or if you feel like it may be too high or too low The blood sugars you reported today are within normal limits-- keep up the great work following a low carbohydrate/ low- sugar/ low salt diet Check your blood pressures several times  each week, alternate taking when at rest and when you are active: write down all of your blood pressures: we will review these during our future phone call appointments- the blood pressure you reported today is within normal limits Take the blood sugars and blood pressures you write down on paper to all doctor visits- this will help your doctors and care team know if your medications should be adjusted Continue following a heart healthy, low salt, low sugar, low-carbohydrate diet Keep taking your medications as they are prescribed- if you have questions about your medications, please let your doctor's/ care team know Follow Up Plan:  Telephone follow up appointment with care management team member scheduled for:  Tuesday July 11, 2021 at 1:00 pm The patient has been provided with contact information for the care management team and has been advised to call with any health related questions or concerns.      Plan: Telephone follow up appointment with care management team member scheduled for:  Tuesday, July 11, 2021 at 1:00 pm The patient has  been provided with contact information for the care management team and has been advised to call with any health related questions or concerns  Oneta Rack, RN, BSN, [place] [phone]: direct office [phone]: mobile

## 2021-04-21 DIAGNOSIS — E119 Type 2 diabetes mellitus without complications: Secondary | ICD-10-CM | POA: Diagnosis not present

## 2021-04-21 DIAGNOSIS — I10 Essential (primary) hypertension: Secondary | ICD-10-CM

## 2021-04-26 ENCOUNTER — Telehealth: Payer: Self-pay

## 2021-04-26 NOTE — Chronic Care Management (AMB) (Signed)
Chronic Care Management Pharmacy Assistant   Name: HAYLEIGH BAWA  MRN: [id number] DOB: 17-Mar-1951  Brooke [name] is an 70 y.o. year old female who presents for her follow-up CCM visit with the clinical pharmacist.  Reason for Encounter: Disease State General  Recent office visits:  None Noted  Recent consult visits:  None Noted  Hospital visits:  None in previous 6 months  Medications: Outpatient Encounter Medications as of 04/26/2021  Medication Sig Note   acetaminophen (TYLENOL) 325 MG tablet Take 650 mg by mouth every 6 (six) hours as needed for mild pain.    albuterol (VENTOLIN HFA) 108 (90 Base) MCG/ACT inhaler INHALE 2 PUFFS BY MOUTH EVERY 6 HOURS AS NEEDED FOR WHEEZE 02/01/2021: 02/01/21: Reports taking q 6 hours "every day"   allopurinol (ZYLOPRIM) 100 MG tablet TAKE 1 TABLET BY MOUTH EVERY DAY    AMBULATORY NON FORMULARY MEDICATION Medication Name: Rolling walker    amLODipine (NORVASC) 10 MG tablet TAKE 1 TABLET BY MOUTH EVERY DAY    citalopram (CELEXA) 40 MG tablet TAKE 1 TABLET BY MOUTH EVERY DAY    clonazePAM (KLONOPIN) 0.5 MG tablet TAKE 1 TABLET (0.5 MG TOTAL) BY MOUTH 2 (TWO) TIMES DAILY AS NEEDED (VERTIGO). (Patient not taking: Reported on 03/15/2021) 02/01/2021: 02/01/21: Reports has not needed recently    fexofenadine (ALLEGRA) 180 MG tablet TAKE 1 TABLET BY MOUTH EVERY DAY    fluticasone-salmeterol (WIXELA INHUB) 250-50 MCG/ACT AEPB TAKE 1 PUFF BY MOUTH TWICE A DAY    hydrALAZINE (APRESOLINE) 10 MG tablet Take 1 tablet (10 mg total) by mouth 3 (three) times daily. Please keep upcoming appt for future refills. Thank you    KLOR-CON M20 20 MEQ tablet TAKE 1 TABLET BY MOUTH TWICE A DAY    meclizine (ANTIVERT) 25 MG tablet TAKE 1 TABLET BY MOUTH 3 TIMES DAILY AS NEEDED FOR DIZZINESS. (Patient not taking: Reported on 03/15/2021) 02/01/2021: 02/01/21: reports has not needed recently    metoprolol succinate (TOPROL-XL) 100 MG 24 hr tablet TAKE 1 TABLET BY MOUTH TWICE A  DAY    omeprazole (PRILOSEC) 20 MG capsule TAKE 1 CAPSULE BY MOUTH TWICE A DAY    oxybutynin (DITROPAN) 5 MG tablet Take 1 tablet (5 mg total) by mouth daily at 10 pm.    pregabalin (LYRICA) 100 MG capsule Take 1 capsule (100 mg total) by mouth 2 (two) times daily. (Patient not taking: Reported on 03/15/2021)    rosuvastatin (CRESTOR) 20 MG tablet TAKE 1 TABLET (20 MG TOTAL) BY MOUTH DAILY. ANNUAL APPT DUE IN [place] (Patient not taking: No sig reported) 02/01/2021: 02/01/21: Patient reports this medication was discontinued by PCP   telmisartan-hydrochlorothiazide (MICARDIS HCT) 80-25 MG tablet Take 1 tablet by mouth daily.    traZODone (DESYREL) 100 MG tablet TAKE 1 TABLET BY MOUTH EVERY DAY AT BEDTIME AS NEEDED FOR SLEEP    triamcinolone (NASACORT) 55 MCG/ACT AERO nasal inhaler PLACE 2 SPRAYS INTO THE NOSE DAILY.    No facility-administered encounter medications on file as of 04/26/2021.    Have you had any problems recently with your health? Pt stated she is not having any new issues with her health  Have you had any problems with your pharmacy? Pt stated she has no issues w/ her pharmacy.  What issues or side effects are you having with your medications? Pt stated she is experiencing some unsteadiness which she thinks is caused by her Lyrica so she discontinued it and is taking OTC Tylenol.  What  would you like me to pass along to [facility],CPP for them to help you with?  Pt stated there is nothing at this time.  What can we do to take care of you better? Pt stated there is nothing at this time.  Star Rating Drugs: rosuvastatin (CRESTOR) 20 MG tablet last filled 06/28/20 90 DS telmisartan-hydrochlorothiazide 80/25 MG tablet last filled 03/06/21 90 DS  [place] Clinical Pharmacist Assistant [phone]

## 2021-04-30 ENCOUNTER — Other Ambulatory Visit: Payer: Self-pay | Admitting: Internal Medicine

## 2021-04-30 DIAGNOSIS — M1000 Idiopathic gout, unspecified site: Secondary | ICD-10-CM

## 2021-04-30 NOTE — Telephone Encounter (Signed)
Please refill as per office routine med refill policy (all routine meds to be refilled for 3 mo or monthly (per pt preference) up to one year from last visit, then month to month grace period for 3 mo, then further med refills will have to be denied) ? ?

## 2021-05-20 ENCOUNTER — Other Ambulatory Visit: Payer: Self-pay | Admitting: Cardiology

## 2021-05-30 ENCOUNTER — Ambulatory Visit: Payer: Medicare Other | Admitting: Internal Medicine

## 2021-06-01 ENCOUNTER — Ambulatory Visit: Payer: Medicare Other | Admitting: Cardiology

## 2021-06-05 ENCOUNTER — Ambulatory Visit: Payer: Medicare Other | Admitting: Internal Medicine

## 2021-06-05 ENCOUNTER — Other Ambulatory Visit: Payer: Self-pay

## 2021-06-05 ENCOUNTER — Encounter: Payer: Self-pay | Admitting: Internal Medicine

## 2021-06-05 ENCOUNTER — Ambulatory Visit (INDEPENDENT_AMBULATORY_CARE_PROVIDER_SITE_OTHER): Payer: Medicare Other | Admitting: Internal Medicine

## 2021-06-05 ENCOUNTER — Other Ambulatory Visit: Payer: Self-pay | Admitting: Internal Medicine

## 2021-06-05 VITALS — BP 162/70 | HR 85 | Temp 97.9°F | Ht 63.0 in | Wt 178.0 lb

## 2021-06-05 DIAGNOSIS — F418 Other specified anxiety disorders: Secondary | ICD-10-CM

## 2021-06-05 DIAGNOSIS — Z23 Encounter for immunization: Secondary | ICD-10-CM

## 2021-06-05 DIAGNOSIS — M7552 Bursitis of left shoulder: Secondary | ICD-10-CM

## 2021-06-05 DIAGNOSIS — E559 Vitamin D deficiency, unspecified: Secondary | ICD-10-CM

## 2021-06-05 DIAGNOSIS — R053 Chronic cough: Secondary | ICD-10-CM | POA: Diagnosis not present

## 2021-06-05 DIAGNOSIS — N1831 Chronic kidney disease, stage 3a: Secondary | ICD-10-CM

## 2021-06-05 DIAGNOSIS — E7800 Pure hypercholesterolemia, unspecified: Secondary | ICD-10-CM

## 2021-06-05 DIAGNOSIS — I10 Essential (primary) hypertension: Secondary | ICD-10-CM

## 2021-06-05 DIAGNOSIS — F4321 Adjustment disorder with depressed mood: Secondary | ICD-10-CM

## 2021-06-05 DIAGNOSIS — R300 Dysuria: Secondary | ICD-10-CM | POA: Diagnosis not present

## 2021-06-05 DIAGNOSIS — E1165 Type 2 diabetes mellitus with hyperglycemia: Secondary | ICD-10-CM | POA: Diagnosis not present

## 2021-06-05 LAB — URINALYSIS, ROUTINE W REFLEX MICROSCOPIC
Bilirubin Urine: NEGATIVE
Hgb urine dipstick: NEGATIVE
Ketones, ur: NEGATIVE
Nitrite: POSITIVE — AB
RBC / HPF: NONE SEEN (ref 0–?)
Specific Gravity, Urine: 1.02 (ref 1.000–1.030)
Total Protein, Urine: NEGATIVE
Urine Glucose: NEGATIVE
Urobilinogen, UA: 0.2 (ref 0.0–1.0)
pH: 6 (ref 5.0–8.0)

## 2021-06-05 LAB — LIPID PANEL
Cholesterol: 241 mg/dL — ABNORMAL HIGH (ref 0–200)
HDL: 55.4 mg/dL (ref >39.00)
LDL Cholesterol: 171 mg/dL — ABNORMAL HIGH (ref 0–99)
NonHDL: 185.83
Total CHOL/HDL Ratio: 4
Triglycerides: 75 mg/dL (ref 0.0–149.0)
VLDL: 15 mg/dL (ref 0.0–40.0)

## 2021-06-05 LAB — BASIC METABOLIC PANEL
BUN: 20 mg/dL (ref 6–23)
CO2: 24 mEq/L (ref 19–32)
Calcium: 9.4 mg/dL (ref 8.4–10.5)
Chloride: 107 mEq/L (ref 96–112)
Creatinine, Ser: 1.57 mg/dL — ABNORMAL HIGH (ref 0.40–1.20)
GFR: 33.48 mL/min — ABNORMAL LOW (ref >60.00)
Glucose, Bld: 106 mg/dL — ABNORMAL HIGH (ref 70–99)
Potassium: 4.4 mEq/L (ref 3.5–5.1)
Sodium: 138 mEq/L (ref 135–145)

## 2021-06-05 LAB — HEPATIC FUNCTION PANEL
ALT: 8 U/L (ref 0–35)
AST: 15 U/L (ref 0–37)
Albumin: 4 g/dL (ref 3.5–5.2)
Alkaline Phosphatase: 108 U/L (ref 39–117)
Bilirubin, Direct: 0 mg/dL (ref 0.0–0.3)
Total Bilirubin: 0.2 mg/dL (ref 0.2–1.2)
Total Protein: 7.3 g/dL (ref 6.0–8.3)

## 2021-06-05 LAB — VITAMIN D 25 HYDROXY (VIT D DEFICIENCY, FRACTURES): VITD: 41.78 ng/mL (ref 30.00–100.00)

## 2021-06-05 LAB — HEMOGLOBIN A1C: Hgb A1c MFr Bld: 6 % (ref 4.6–6.5)

## 2021-06-05 MED ORDER — ROSUVASTATIN CALCIUM 20 MG PO TABS: ORAL_TABLET | ORAL | 3 refills | Status: DC

## 2021-06-05 MED ORDER — CIPROFLOXACIN HCL 500 MG PO TABS: 500.0000 mg | ORAL_TABLET | Freq: Two times a day (BID) | ORAL | 0 refills | Status: AC

## 2021-06-05 MED ORDER — TELMISARTAN-HCTZ 80-25 MG PO TABS: 1.0000 | ORAL_TABLET | Freq: Every day | ORAL | 3 refills | Status: DC

## 2021-06-05 MED ORDER — BENZONATATE 100 MG PO CAPS: 100.0000 mg | ORAL_CAPSULE | Freq: Three times a day (TID) | ORAL | 0 refills | Status: DC | PRN

## 2021-06-05 NOTE — Progress Notes (Signed)
Patient ID: Brooke [name], female   DOB: [birth year], 70 y.o.   MRN: [id number]        Chief Complaint: follow up grief reaction, uti symptoms, cough, hld, , htn, dm       HPI:  Brooke [name] is a 70 y.o. female here overall not doing as well in general; husband died Jun 22, 2023 after long illness and she has lost significant wt in the past 6 months, with lessening blood sugars and even a fall recently on jardiance;  she wasn't sure if her meds were too much so stopped her jardiance, crestor and micardisHCT for the past month.  Also has a chronic cough for many months, no productive, never smoked, denies worsening asthma, allergies and reflux.  Does also c/o 3 days onset urinary urgency and frequency but Denies urinary symptoms such as dysuria, flank pain, hematuria or n/v, fever, chills.  Pt denies chest pain, increased sob or doe, wheezing, orthopnea, PND, increased LE swelling, palpitations, dizziness or syncope.   Pt denies polydipsia, polyuria, or new focal neuro s/s.   Pt denies night sweats, but has loss of appetite and feeling low mood recent. Wt Readings from Last 3 Encounters:  06/05/21 178 lb (80.7 kg)  12/02/20 182 lb 9.6 oz (82.8 kg)  09/29/20 195 lb 12.8 oz (88.8 kg)   BP Readings from Last 3 Encounters:  06/05/21 (!) 162/70  12/02/20 136/74  09/29/20 140/80         Past Medical History:  Diagnosis Date   Alopecia    Anxiety    Aortic atherosclerosis (HCC)    Asthma    FOLLOWED BY PCP   Eczema    GERD (gastroesophageal reflux disease)    Gout    04-28-2018--- per pt stable , as been a while since last episode   Heart murmur    History of colon polyps    History of syncope 2015   Hyperlipidemia    Hypertension    Hypothyroidism    OA (osteoarthritis) of knee    bilateral   PVC's (premature ventricular contractions)    Toxic multinodular goiter    03/ 2003  s/p RAI   Type 2 diabetes mellitus ([place])    followed by pcp   Ventricular tachycardia, polymorphic 01/04/2014    primary cardiologist-- dr Tressia Miners turner (hx monitor 2015 showed couplet PVCs, as trigger)   Wears glasses    Wears partial dentures    upper   Past Surgical History:  Procedure Laterality Date   ABDOMINAL HYSTERECTOMY  10/22/1999   WITH BSO   CATARACT EXTRACTION W/ INTRAOCULAR LENS IMPLANT Left YRS AGO   COLONOSCOPY     EXCISION ABDOMINAL WALL MASS  12-20-2005   dr Margot Chimes @[facility]    neruofibroma   LAPAROSCOPIC CHOLECYSTECTOMY  10/01/2002   dr Margot Chimes @[facility]    LEFT HEART CATHETERIZATION WITH CORONARY ANGIOGRAM N/A 01/07/2014   Procedure: LEFT HEART CATHETERIZATION WITH CORONARY ANGIOGRAM;  Surgeon: Peter M Martinique, MD;  Location: [facility] C[facility];  Service: Cardiovascular;  Laterality: N/A;   RASTELLI PROCEDURE  6/98 neg   RENAL ARTERY STENT Left 11/2003   angioplasty and stenting   RENAL ARTERY STENT Left 02/2005   re-stenting   TOTAL KNEE ARTHROPLASTY Right 05/05/2018   Procedure: RIGHT TOTAL KNEE ARTHROPLASTY;  Surgeon: Frederik Pear, MD;  Location: [facility];  Service: Orthopedics;  Laterality: Right;   TOTAL KNEE ARTHROPLASTY Left 09/30/2018   Procedure: TOTAL KNEE ARTHROPLASTY;  Surgeon: Melrose Nakayama, MD;  Location: [facility];  Service: Orthopedics;  Laterality: Left;    reports that she has never smoked. She has never used smokeless tobacco. She reports that she does not drink alcohol and does not use drugs. family history includes Diabetes in her mother; Heart disease in her father. Allergies  Allergen Reactions   Metformin And Related Nausea And Vomiting   Ace Inhibitors Swelling        Codeine Nausea And Vomiting and Rash   Hydrocodone Nausea And Vomiting   Tramadol Nausea And Vomiting   Tylenol [Acetaminophen] Itching and Rash   Current Outpatient Medications on File Prior to Visit  Medication Sig Dispense Refill   acetaminophen (TYLENOL) 325 MG tablet Take 650 mg by mouth every 6 (six) hours as needed for mild pain.     albuterol (VENTOLIN HFA) 108 (90 Base) MCG/ACT inhaler INHALE 2  PUFFS BY MOUTH EVERY 6 HOURS AS NEEDED FOR WHEEZE 25.5 each 2   allopurinol (ZYLOPRIM) 100 MG tablet TAKE 1 TABLET BY MOUTH EVERY DAY 90 tablet 0   AMBULATORY NON FORMULARY MEDICATION Medication Name: Rolling walker 1 Device 0   amLODipine (NORVASC) 10 MG tablet TAKE 1 TABLET BY MOUTH EVERY DAY 90 tablet 0   citalopram (CELEXA) 40 MG tablet TAKE 1 TABLET BY MOUTH EVERY DAY 90 tablet 2   clonazePAM (KLONOPIN) 0.5 MG tablet TAKE 1 TABLET (0.5 MG TOTAL) BY MOUTH 2 (TWO) TIMES DAILY AS NEEDED (VERTIGO). 30 tablet 2   fexofenadine (ALLEGRA) 180 MG tablet TAKE 1 TABLET BY MOUTH EVERY DAY 90 tablet 3   fluticasone-salmeterol (WIXELA INHUB) 250-50 MCG/ACT AEPB TAKE 1 PUFF BY MOUTH TWICE A DAY 60 each 11   hydrALAZINE (APRESOLINE) 10 MG tablet TAKE 1 TABLET BY MOUTH 3 TIMES DAILY. PLEASE KEEP UPCOMING APPT FOR FUTURE REFILLS. THANK YOU 90 tablet 1   KLOR-CON M20 20 MEQ tablet TAKE 1 TABLET BY MOUTH TWICE A DAY 180 tablet 1   meclizine (ANTIVERT) 25 MG tablet TAKE 1 TABLET BY MOUTH 3 TIMES DAILY AS NEEDED FOR DIZZINESS. 30 tablet 5   metoprolol succinate (TOPROL-XL) 100 MG 24 hr tablet TAKE 1 TABLET BY MOUTH TWICE A DAY 180 tablet 3   omeprazole (PRILOSEC) 20 MG capsule TAKE 1 CAPSULE BY MOUTH TWICE A DAY 180 capsule 3   oxybutynin (DITROPAN) 5 MG tablet Take 1 tablet (5 mg total) by mouth daily at 10 pm. 90 tablet 3   traZODone (DESYREL) 100 MG tablet TAKE 1 TABLET BY MOUTH EVERY DAY AT BEDTIME AS NEEDED FOR SLEEP 90 tablet 1   triamcinolone (NASACORT) 55 MCG/ACT AERO nasal inhaler PLACE 2 SPRAYS INTO THE NOSE DAILY. 16.9 each 12   pregabalin (LYRICA) 100 MG capsule Take 1 capsule (100 mg total) by mouth 2 (two) times daily. (Patient not taking: No sig reported) 60 capsule 2   No current facility-administered medications on file prior to visit.        ROS:  All others reviewed and negative.  Objective        PE:  BP (!) 162/70 (BP Location: Right Arm, Patient Position: Sitting, Cuff Size: Large)    Pulse 85   Temp 97.9 F (36.6 C) (Oral)   Ht 5\' 3"  (1.6 m)   Wt 178 lb (80.7 kg)   SpO2 100%   BMI 31.53 kg/m                 Constitutional: Pt appears in NAD  HENT: Head: NCAT.                Right Ear: External ear normal.                 Left Ear: External ear normal.                Eyes: . Pupils are equal, round, and reactive to light. Conjunctivae and EOM are normal               Nose: without d/c or deformity               Neck: Neck supple. Gross normal ROM               Cardiovascular: Normal rate and regular rhythm.                 Pulmonary/Chest: Effort normal and breath sounds without rales or wheezing.                Abd:  Soft,mild low mid abd tender,, ND, + BS, no organomegaly               Neurological: Pt is alert. At baseline orientation, motor grossly intact               Skin: Skin is warm. No rashes, no other new lesions, LE edema - trace bilateral; left shoulder with tender left subacromial               Psychiatric: Pt behavior is normal without agitation , depressed affect  Micro: none  Cardiac tracings I have personally interpreted today:  none  Pertinent Radiological findings (summarize): none   Lab Results  Component Value Date   WBC 7.8 12/02/2020   HGB 10.7 (L) 12/02/2020   HCT 33.2 (L) 12/02/2020   PLT 273.0 12/02/2020   GLUCOSE 106 (H) 06/05/2021   CHOL 241 (H) 06/05/2021   TRIG 75.0 06/05/2021   HDL 55.40 06/05/2021   LDLDIRECT 139.1 09/05/2009   LDLCALC 171 (H) 06/05/2021   ALT 8 06/05/2021   AST 15 06/05/2021   NA 138 06/05/2021   K 4.4 06/05/2021   CL 107 06/05/2021   CREATININE 1.57 (H) 06/05/2021   BUN 20 06/05/2021   CO2 24 06/05/2021   TSH 2.17 12/02/2020   INR 1.0 09/24/2018   HGBA1C 6.0 06/05/2021   MICROALBUR 0.8 12/02/2020   Assessment/Plan:  Hollyanne Schloesser Padmore is a 70 y.o. Black or African American [2] female with  has a past medical history of Alopecia, Anxiety, Aortic atherosclerosis ([place]), Asthma, Eczema,  GERD (gastroesophageal reflux disease), Gout, Heart murmur, History of colon polyps, History of syncope (2015), Hyperlipidemia, Hypertension, Hypothyroidism, OA (osteoarthritis) of knee, PVC's (premature ventricular contractions), Toxic multinodular goiter, Type 2 diabetes mellitus ([place]), Ventricular tachycardia, polymorphic (01/04/2014), Wears glasses, and Wears partial dentures.  Diabetes Lab Results  Component Value Date   HGBA1C 6.0 06/05/2021   Stable, pt to continue current medical treatment  - diet only, ok to d/c jardiane as she has   Hyperlipidemia Lab Results  Component Value Date   LDLCALC 171 (H) 06/05/2021   Severe uncontrolled,  pt to restart current statin crestor 20, and lower chol diet  Essential hypertension BP Readings from Last 3 Encounters:  06/05/21 (!) 162/70  12/02/20 136/74  09/29/20 140/80   Severe uncontrolled, pt to restart micardis HCT and continue to monitor at home and next visit   Depression with anxiety Now with recent husband passing  nov 5 likely leading to wt loss and lower sugars; pt urged compliance with SSRI and klonopin prn, and declines counseling or psychiatric referral,  to f/u any worsening symptoms or concerns  CKD (chronic kidney disease) stage 3, GFR 30-59 ml/min (HCC) Lab Results  Component Value Date   CREATININE 1.57 (H) 06/05/2021   Stable overall, cont to avoid nephrotoxins   Vitamin D deficiency Last vitamin D Lab Results  Component Value Date   VD25OH 41.78 06/05/2021   Stable, cont oral replacement   Cough Etiology unclear, declines cxr, for tessalon perle prn, pt stats will f/u for any persistent or worsening  Dysuria Now with porssible uti - for urine studies, empiric antibx  Grief Pt counseled on effect with wt loss, lower sugars and recent fall; declines other change in tx for now such as medication or referrals  Acute shoulder bursitis, left Also incidental noted today, pt asked to f/u with sport  medicine first floor for possible cortisone and/or PT  Followup: Return in about 6 months (around 12/03/2021).  [name], MD 06/12/2021 4:34 AM [facility]

## 2021-06-05 NOTE — Patient Instructions (Signed)
You had the flu shot today  Please take all new medication as prescribed - the antibiotic for the urinary tract infectioin, and the pills for coughing as needed  Ok to also take tylenol as needed, and OTC topical Voltaren gel for the let shoulder  You will be contacted regarding the referral for: Sports Medicine on the first floor later  Please continue all other medications as before, and refills have been done so you can restart the cholesterol medication (Crestor), and the Blood Pressure medication (micardis HCT)  Please have the pharmacy call with any other refills you may need.  Please continue your efforts at being more active, low cholesterol diet, and weight control.  Please keep your appointments with your specialists as you may have planned  Please go to the LAB at the blood drawing area for the tests to be done  You will be contacted by phone if any changes need to be made immediately.  Otherwise, you will receive a letter about your results with an explanation, but please check with MyChart first.  Please remember to sign up for MyChart if you have not done so, as this will be important to you in the future with finding out test results, communicating by private email, and scheduling acute appointments online when needed.  Please make an Appointment to return in 6 months, or sooner if needed

## 2021-06-07 LAB — URINE CULTURE

## 2021-06-08 NOTE — Progress Notes (Signed)
I, Brooke [name], LAT, ATC, am serving as scribe for Dr. Lynne [name].  Brooke [name] is a 70 y.o. female who presents to [place] [facility] today for f/u of L shoulder pain.  She was last seen by Dr. Georgina Snell on 03/31/21 for f/u of B shoulder pain and had a R subacromial steroid injection. She was seen prior to that on 03/10/21 and had a L subacromial injection.  Today, pt reports L shoulder is pretty painful and started hurting again about 1 month ago. Pt notes much relief from prior steroid injection. Pt locates pain to the superior aspect of the L GH joint and over the lateral aspect of the proximal aspect of the upper arm.  Diagnostic imaging: R and L shoulder XR- 03/10/21  Pertinent review of systems: No fevers or chills  Relevant historical information: Hypertension.  Diabetes. Her husband died on June 05, 2023.  She followed up with her primary care provider earlier this month for grief.   Exam:  BP (!) 158/98   Pulse 71   Ht 5\' 3"  (1.6 m)   Wt 187 lb (84.8 kg)   SpO2 96%   BMI 33.13 kg/m  General: Well Developed, well nourished, and in no acute distress.   MSK: Left shoulder normal-appearing Range of motion abduction 130 degrees internal rotation lumbar spine external rotation full. Strength 4/5 abduction 5/5 external and internal rotation. Positive empty can test. Positive Hawkins and Neer's test. Pulses cap refill and sensation are intact distally.   Lab and Radiology Results  Procedure: Real-time Ultrasound Guided Injection of left shoulder subacromial bursa Device: Philips Affiniti 50G Images permanently stored and available for review in PACS Verbal informed consent obtained.  Discussed risks and benefits of procedure. Warned about infection bleeding damage to structures skin hypopigmentation and fat atrophy among others. Patient expresses understanding and agreement Time-out conducted.   Noted no overlying erythema, induration, or other signs of  local infection.   Skin prepped in a sterile fashion.   Local anesthesia: Topical Ethyl chloride.   With sterile technique and under real time ultrasound guidance: 40 mg of Kenalog and 2 mL of Marcaine injected into subacromial bursa. Fluid seen entering the bursa.   Completed without difficulty   Pain immediately resolved suggesting accurate placement of the medication.   Advised to call if fevers/chills, erythema, induration, drainage, or persistent bleeding.   Images permanently stored and available for review in the ultrasound unit.  Impression: Technically successful ultrasound guided injection.   EXAM: LEFT SHOULDER - 2+ VIEW   COMPARISON:  None.   FINDINGS: No acute fracture or dislocation. Mild acromioclavicular joint space narrowing with small marginal osteophytes. The glenohumeral joint space is preserved. Bone mineralization is normal. Soft tissues are unremarkable.   IMPRESSION: 1. Mild acromioclavicular osteoarthritis.     Electronically Signed   By: Titus Dubin M.D.   On: 03/10/2020 16:05 I, Brooke [name], personally (independently) visualized and performed the interpretation of the images attached in this note.       Assessment and Plan: 70 y.o. female with left shoulder pain due to subacromial bursitis.  Plan for subacromial injection today.  Continue home exercise program recheck as needed.   PDMP not reviewed this encounter. Orders Placed This Encounter  Procedures   [place] LIMITED JOINT SPACE STRUCTURES UP LEFT(NO LINKED CHARGES)    Order Specific Question:   Reason for Exam (SYMPTOM  OR DIAGNOSIS REQUIRED)    Answer:   L shoulder pain  Order Specific Question:   Preferred imaging location?    Answer:   [address]   No orders of the defined types were placed in this encounter.    Discussed warning signs or symptoms. Please see discharge instructions. Patient expresses understanding.   The above documentation has been  reviewed and is accurate and complete Brooke [name], M.D.

## 2021-06-12 ENCOUNTER — Ambulatory Visit: Payer: Self-pay

## 2021-06-12 ENCOUNTER — Ambulatory Visit: Payer: Medicare Other | Admitting: Family Medicine

## 2021-06-12 ENCOUNTER — Encounter: Payer: Self-pay | Admitting: Internal Medicine

## 2021-06-12 ENCOUNTER — Other Ambulatory Visit: Payer: Self-pay

## 2021-06-12 VITALS — BP 158/98 | HR 71 | Ht 63.0 in | Wt 187.0 lb

## 2021-06-12 DIAGNOSIS — M7552 Bursitis of left shoulder: Secondary | ICD-10-CM | POA: Diagnosis not present

## 2021-06-12 DIAGNOSIS — G8929 Other chronic pain: Secondary | ICD-10-CM

## 2021-06-12 DIAGNOSIS — M25512 Pain in left shoulder: Secondary | ICD-10-CM

## 2021-06-12 NOTE — Assessment & Plan Note (Addendum)
Now with recent husband passing nov 5 likely leading to wt loss and lower sugars; pt urged compliance with SSRI and klonopin prn, and declines counseling or psychiatric referral,  to f/u any worsening symptoms or concerns

## 2021-06-12 NOTE — Assessment & Plan Note (Signed)
Lab Results  Component Value Date   HGBA1C 6.0 06/05/2021   Stable, pt to continue current medical treatment  - diet only, ok to d/c jardiane as she has

## 2021-06-12 NOTE — Assessment & Plan Note (Signed)
BP Readings from Last 3 Encounters:  06/05/21 (!) 162/70  12/02/20 136/74  09/29/20 140/80   Severe uncontrolled, pt to restart micardis HCT and continue to monitor at home and next visit

## 2021-06-12 NOTE — Assessment & Plan Note (Signed)
Lab Results  Component Value Date   CREATININE 1.57 (H) 06/05/2021   Stable overall, cont to avoid nephrotoxins

## 2021-06-12 NOTE — Assessment & Plan Note (Signed)
Lab Results  Component Value Date   LDLCALC 171 (H) 06/05/2021   Severe uncontrolled,  pt to restart current statin crestor 20, and lower chol diet

## 2021-06-12 NOTE — Assessment & Plan Note (Signed)
Also incidental noted today, pt asked to f/u with sport medicine first floor for possible cortisone and/or PT

## 2021-06-12 NOTE — Patient Instructions (Addendum)
Thank you for coming in today.   You received a steroid injection in your left shoulder today. Seek immediate medical attention if the joint becomes red, extremely painful, or is oozing fluid.   Recheck back as needed

## 2021-06-12 NOTE — Assessment & Plan Note (Signed)
Etiology unclear, declines cxr, for tessalon perle prn, pt stats will f/u for any persistent or worsening

## 2021-06-12 NOTE — Assessment & Plan Note (Signed)
Pt counseled on effect with wt loss, lower sugars and recent fall; declines other change in tx for now such as medication or referrals

## 2021-06-12 NOTE — Assessment & Plan Note (Signed)
Last vitamin D Lab Results  Component Value Date   VD25OH 41.78 06/05/2021   Stable, cont oral replacement

## 2021-06-12 NOTE — Assessment & Plan Note (Signed)
Now with porssible uti - for urine studies, empiric antibx

## 2021-06-16 ENCOUNTER — Telehealth: Payer: Medicare Other

## 2021-06-18 ENCOUNTER — Other Ambulatory Visit: Payer: Self-pay | Admitting: Internal Medicine

## 2021-06-19 ENCOUNTER — Other Ambulatory Visit: Payer: Self-pay | Admitting: Internal Medicine

## 2021-06-20 ENCOUNTER — Telehealth: Payer: Medicare Other

## 2021-06-20 DIAGNOSIS — D631 Anemia in chronic kidney disease: Secondary | ICD-10-CM | POA: Diagnosis not present

## 2021-06-20 DIAGNOSIS — N2581 Secondary hyperparathyroidism of renal origin: Secondary | ICD-10-CM | POA: Diagnosis not present

## 2021-06-20 DIAGNOSIS — Z791 Long term (current) use of non-steroidal anti-inflammatories (NSAID): Secondary | ICD-10-CM | POA: Diagnosis not present

## 2021-06-20 DIAGNOSIS — I129 Hypertensive chronic kidney disease with stage 1 through stage 4 chronic kidney disease, or unspecified chronic kidney disease: Secondary | ICD-10-CM | POA: Diagnosis not present

## 2021-06-20 DIAGNOSIS — N1832 Chronic kidney disease, stage 3b: Secondary | ICD-10-CM | POA: Diagnosis not present

## 2021-06-20 DIAGNOSIS — N281 Cyst of kidney, acquired: Secondary | ICD-10-CM | POA: Diagnosis not present

## 2021-06-20 NOTE — Progress Notes (Unsigned)
Chronic Care Management Pharmacy Note  06/20/2021 Name:  Brooke [name] MRN:  [id number] DOB:  1951-02-28  Summary: - Patient reports that she continues to monitor blood pressure and blood sugars at home, doing well BP ranging 114-135/61-79 on average, with blood sugars averaging 104-126 -states that she feels her mental health has improved since starting citalopram, is comfortable with current mental health, declines further interventions at this time - LDL elevated at 158, patient reports that she is no longer taking rosuvastatin, unsure as to why she stopped, denies having issues while taking medication in past -Reports that she has not yet trialed 170m lyrica dosing, is filled at pharmacy, just needs to pick up, currently only taking acetaminophen as needed for pain  Recommendations/Changes made from today's visit: - Patient to continue monitoring blood pressure and blood sugars, will reach out should BP >140/90 or if BG >150, continue current BG medications, will plan to continue to manage DM with diet -Advised for patient to restart rosuvastatin 253mdaily - when previously taking LDL at goal ~60-70 -Patient to start lyrica 10056m will start at 100m38mily x 1 week, then increase to 100mg19mce daily   Subjective: Brooke TYNIAH KASTENSn 70 y.70 year old female who is a primary patient of John,Jenny ReichmannesHunt Oris  The CCM team was consulted for assistance with disease management and care coordination needs.    Engaged with patient by telephone for initial visit in response to provider referral for pharmacy case management and/or care coordination services.   Consent to Services:  The patient was given the following information about Chronic Care Management services today, agreed to services, and gave verbal consent: 1. CCM service includes personalized support from designated clinical staff supervised by the primary care provider, including individualized plan of care and coordination with  other care providers 2. 24/7 contact phone numbers for assistance for urgent and routine care needs. 3. Service will only be billed when office clinical staff spend 20 minutes or more in a month to coordinate care. 4. Only one practitioner may furnish and bill the service in a calendar month. 5.The patient may stop CCM services at any time (effective at the end of the month) by phone call to the office staff. 6. The patient will be responsible for cost sharing (co-pay) of up to 20% of the service fee (after annual deductible is met). Patient agreed to services and consent obtained.  Patient Care Team: John,Biagio Borgas PCP - General TurneSueanne Margaritaas PCP - Cardiology (Cardiology) DalldMelrose Nakayamaas Consulting Physician (Orthopedic Surgery) GouldMelissa NoonasRidge Manoreferring Physician (Optometry) TouseKnox Royaltyas Case Manager Chauntae Hults, DanieDarnelle Maffucci aHoward County Medical Centerharmacist (Pharmacist)  Recent office visits:  06/05/2021 - Dr. John Jenny Reichmann to restart micardis HCT and rosuvastatin, will continue hold of jardiance for now    Recent consult visits:  Dr. CoreyGeorgina Snellorts medicine - left shoulder pain - subacromial injection completed today, patient to continue home exercise program - f/u as needed    Hospital visits:  None in previous 6 months  Objective:  Lab Results  Component Value Date   CREATININE 1.57 (H) 06/05/2021   BUN 20 06/05/2021   GFR 33.48 (L) 06/05/2021   GFRNONAA 34 (L) 09/24/2018   GFRAA 39 (L) 09/24/2018   NA 138 06/05/2021   K 4.4 06/05/2021   CALCIUM 9.4 06/05/2021   CO2 24 06/05/2021   GLUCOSE 106 (H) 06/05/2021  Lab Results  Component Value Date/Time   HGBA1C 6.0 06/05/2021 10:53 AM   HGBA1C 5.9 12/02/2020 10:50 AM   GFR 33.48 (L) 06/05/2021 10:53 AM   GFR 39.22 (L) 12/02/2020 10:50 AM   MICROALBUR 0.8 12/02/2020 10:50 AM   MICROALBUR 2.9 (H) 12/01/2019 10:30 AM    Last diabetic Eye exam:  Lab Results  Component Value Date/Time   HMDIABEYEEXA No  Retinopathy 12/08/2020 01:17 PM    Last diabetic Foot exam:  No results found for: HMDIABFOOTEX   Lab Results  Component Value Date   CHOL 241 (H) 06/05/2021   HDL 55.40 06/05/2021   LDLCALC 171 (H) 06/05/2021   LDLDIRECT 139.1 09/05/2009   TRIG 75.0 06/05/2021   CHOLHDL 4 06/05/2021    Hepatic Function Latest Ref Rng & Units 06/05/2021 12/02/2020 06/21/2020  Total Protein 6.0 - 8.3 g/dL 7.3 7.2 7.3  Albumin 3.5 - 5.2 g/dL 4.0 3.9 4.1  AST 0 - 37 U/L _0 ALT 0 - 35 U/L _1 Alk Phosphatase 39 - 117 U/L 108 93 77  Total Bilirubin 0.2 - 1.2 mg/dL 0.2 0.3 0.3  Bilirubin, Direct 0.0 - 0.3 mg/dL 0.0 0.1 0.1    Lab Results  Component Value Date/Time   TSH 2.17 12/02/2020 10:50 AM   TSH 2.74 12/01/2019 10:30 AM    CBC Latest Ref Rng & Units 12/02/2020 12/01/2019 01/27/2019  WBC 4.0 - 10.5 K/uL 7.8 8.0 7.0  Hemoglobin 12.0 - 15.0 g/dL 10.7(L) 11.2(L) 11.0(L)  Hematocrit 36.0 - 46.0 % 33.2(L) 34.0(L) 34.2(L)  Platelets 150.0 - 400.0 K/uL 273.0 227.0 268.0    Lab Results  Component Value Date/Time   VD25OH 41.78 06/05/2021 10:53 AM   VD25OH 46.82 12/02/2020 10:50 AM    Clinical ASCVD: No  The 10-year ASCVD risk score (Arnett DK, et al., 2019) is: 41.2%   Values used to calculate the score:     Age: 66 years     Sex: Female     Is Non-Hispanic African American: Yes     Diabetic: Yes     Tobacco smoker: No     Systolic Blood Pressure: 383 mmHg     Is BP treated: Yes     HDL Cholesterol: 55.4 mg/dL     Total Cholesterol: 241 mg/dL    Depression screen [facility] 2/9 03/15/2021 02/01/2021 12/02/2020  Decreased Interest 3 0 0  Down, Depressed, Hopeless _2 PHQ - 2 Score _3 Altered sleeping 0 - -  Tired, decreased energy 3 - -  Change in appetite 3 - -  Feeling bad or failure about yourself  0 - -  Trouble concentrating 0 - -  Moving slowly or fidgety/restless 0 - -  Suicidal thoughts 0 - -  PHQ-9 Score 12 - -  Difficult doing work/chores - - -  Some recent data  might be hidden    Social History   Tobacco Use  Smoking Status Never  Smokeless Tobacco Never   BP Readings from Last 3 Encounters:  06/12/21 (!) 158/98  06/05/21 (!) 162/70  12/02/20 136/74   Pulse Readings from Last 3 Encounters:  06/12/21 71  06/05/21 85  12/02/20 74   Wt Readings from Last 3 Encounters:  06/12/21 187 lb (84.8 kg)  06/05/21 178 lb (80.7 kg)  12/02/20 182 lb 9.6 oz (82.8 kg)   BMI Readings from Last 3 Encounters:  06/12/21 33.13 kg/m  06/05/21 31.53 kg/m  12/02/20 32.35 kg/m  Assessment/Interventions: Review of patient past medical history, allergies, medications, health status, including review of consultants reports, laboratory and other test data, was performed as part of comprehensive evaluation and provision of chronic care management services.   SDOH:  (Social Determinants of Health) assessments and interventions performed: Yes   SDOH Screenings   Alcohol Screen: Low Risk    Last Alcohol Screening Score (AUDIT): 0  Depression (PHQ2-9): Medium Risk   PHQ-2 Score: 12  Financial Resource Strain: Low Risk    Difficulty of Paying Living Expenses: Not hard at all  Food Insecurity: No Food Insecurity   Worried About Charity fundraiser in the Last Year: Never true   Ran Out of Food in the Last Year: Never true  Housing: Low Risk    Last Housing Risk Score: 0  Physical Activity: Inactive   Days of Exercise per Week: 0 days   Minutes of Exercise per Session: 0 min  Social Connections: Engineer, building services of Communication with Friends and Family: More than three times a week   Frequency of Social Gatherings with Friends and Family: More than three times a week   Attends Religious Services: More than 4 times per year   Active Member of [employer] or Organizations: No   Attends Music therapist: More than 4 times per year   Marital Status: Married  Stress: Stress Concern Present   Feeling of Stress : Rather much   Tobacco Use: Low Risk    Smoking Tobacco Use: Never   Smokeless Tobacco Use: Never   Passive Exposure: Not on file  Transportation Needs: No Transportation Needs   Lack of Transportation (Medical): No   Lack of Transportation (Non-Medical): No    CCM Care Plan  Allergies  Allergen Reactions   Metformin And Related Nausea And Vomiting   Ace Inhibitors Swelling        Codeine Nausea And Vomiting and Rash   Hydrocodone Nausea And Vomiting   Tramadol Nausea And Vomiting   Tylenol [Acetaminophen] Itching and Rash    Medications Reviewed Today     Reviewed by Mare Ferrari (Technician) on 06/12/21 at [place] List Status: <None>   Medication Order Taking? Sig Documenting Provider Last Dose Status Informant  acetaminophen (TYLENOL) 325 MG tablet [id number] No Take 650 mg by mouth every 6 (six) hours as needed for mild pain. Biagio Borg, MD Taking Active Self  albuterol (VENTOLIN HFA) 108 [phone] Base) MCG/ACT inhaler [id number] No INHALE 2 PUFFS BY MOUTH EVERY 6 HOURS AS NEEDED FOR WHEEZE Biagio Borg, MD Taking Active            Med Note Marzetta Merino Feb 01, 2021  1:16 PM) 02/01/21: Reports taking q 6 hours "every day"  allopurinol (ZYLOPRIM) 100 MG tablet [id number] No TAKE 1 TABLET BY MOUTH EVERY DAY Biagio Borg, MD Taking Active   AMBULATORY NON [facility] MEDICATION [id number] No Medication Name: Rolling walker Lyndal Pulley, DO Taking Active Self  amLODipine (NORVASC) 10 MG tablet [id number] No TAKE 1 TABLET BY MOUTH EVERY DAY Biagio Borg, MD Taking Active   benzonatate (TESSALON PERLES) 100 MG capsule [id number]  Take 1 capsule (100 mg total) by mouth 3 (three) times daily as needed for cough. Biagio Borg, MD  Active   ciprofloxacin (CIPRO) 500 MG tablet [id number]  Take 1 tablet (500 mg total) by mouth 2 (two) times daily for 10 days. Biagio Borg, MD  Active   citalopram (CELEXA) 40 MG tablet [id number] No TAKE 1 TABLET BY MOUTH EVERY DAY Biagio Borg, MD Taking  Active   clonazePAM (KLONOPIN) 0.5 MG tablet [id number] No TAKE 1 TABLET (0.5 MG TOTAL) BY MOUTH 2 (TWO) TIMES DAILY AS NEEDED (VERTIGO). Biagio Borg, MD Taking Active            Med Note Marzetta Merino Feb 01, 2021  1:18 PM) 02/01/21: Reports has not needed recently   fexofenadine (ALLEGRA) 180 MG tablet [id number] No TAKE 1 TABLET BY MOUTH EVERY DAY Biagio Borg, MD Taking Active   fluticasone-salmeterol [facility] IN[facility]) 250-50 MCG/ACT AEPB [id number] No TAKE 1 PUFF BY MOUTH TWICE A DAY Biagio Borg, MD Taking Active   hydrALAZINE (APRESOLINE) 10 MG tablet [id number] No TAKE 1 TABLET BY MOUTH 3 TIMES DAILY. PLEASE KEEP UPCOMING APPT FOR FUTURE REFILLS. THANK Anner Crete, MD Taking Active   KLOR-CON M20 20 MEQ tablet [id number] No TAKE 1 TABLET BY MOUTH TWICE A DAY Biagio Borg, MD Taking Active   meclizine (ANTIVERT) 25 MG tablet [id number] No TAKE 1 TABLET BY MOUTH 3 TIMES DAILY AS NEEDED FOR DIZZINESS. Biagio Borg, MD Taking Active            Med Note Marzetta Merino Feb 01, 2021  1:20 PM) 02/01/21: reports has not needed recently   metoprolol succinate (TOPROL-XL) 100 MG 24 hr tablet [id number] No TAKE 1 TABLET BY MOUTH TWICE A DAY Biagio Borg, MD Taking Active   omeprazole (PRILOSEC) 20 MG capsule [id number] No TAKE 1 CAPSULE BY MOUTH TWICE A DAY Biagio Borg, MD Taking Active   oxybutynin (DITROPAN) 5 MG tablet [id number] No Take 1 tablet (5 mg total) by mouth daily at 10 pm. Biagio Borg, MD Taking Active   pregabalin (LYRICA) 100 MG capsule [id number] No Take 1 capsule (100 mg total) by mouth 2 (two) times daily.  Patient not taking: No sig reported   Biagio Borg, MD Not Taking Active   rosuvastatin (CRESTOR) 20 MG tablet [id number]  TAKE 1 TABLET (20 MG TOTAL) BY MOUTH DAILY Biagio Borg, MD  Active   telmisartan-hydrochlorothiazide (MICARDIS HCT) 80-25 MG tablet [id number]  Take 1 tablet by mouth daily. Biagio Borg, MD  Active   traZODone (DESYREL) 100 MG tablet  [id number] No TAKE 1 TABLET BY MOUTH EVERY DAY AT BEDTIME AS NEEDED FOR SLEEP Biagio Borg, MD Taking Active   triamcinolone (NASACORT) 55 MCG/ACT AERO nasal inhaler [id number] No PLACE 2 SPRAYS INTO THE NOSE DAILY. Biagio Borg, MD Taking Active             Patient Active Problem List   Diagnosis Date Noted   Grief 06/05/2021   Acute shoulder bursitis, left 06/05/2021   Bilateral shoulder pain 03/05/2020   Vitamin D deficiency 12/01/2019   Allergic rhinitis 06/16/2019   Asthma exacerbation 06/16/2019   Dyspnea 01/27/2019   Primary osteoarthritis of left knee 09/30/2018   CKD (chronic kidney disease) stage 3, GFR 30-59 ml/min (HCC) 07/25/2018   Chronic back pain 07/25/2018   Primary osteoarthritis of right knee 05/05/2018   Osteoarthritis of right knee 04/30/2018   Degenerative arthritis of knee, bilateral 08/07/2017   Depression with anxiety 07/19/2017   Vertigo 01/12/2017   Low back pain 01/12/2017   SOB (shortness of breath) 12/02/2016   Dysuria 09/12/2016   Bilateral knee pain 02/15/2015   Effusion  of right knee 01/28/2015   Asthma with exacerbation 11/09/2014   Pronation deformity of both feet 09/29/2014   Greater trochanteric bursitis of left hip 08/31/2014   Degenerative arthritis of left knee 05/18/2014   Ventricular tachycardia, polymorphic 01/04/2014   Abnormal MRI of head 12/04/2013   Syncope 11/06/2013   Chest pain 11/06/2013   Headache 11/06/2013   Nausea with vomiting 11/06/2013   Cough 08/28/2013   Hypokalemia 81/85/9093   Diastolic dysfunction 06/13/6243   Leukocytosis 01/03/2011   Toe pain 12/20/2010   Encounter for well adult exam with abnormal findings 12/19/2010   FATIGUE 09/05/2009   Diabetes ([place]) 11/29/2008   Gout 08/09/2008   Normochromic normocytic anemia 02/09/2008   GERD 02/09/2008   ALOPECIA 12/30/2007   Hyperlipidemia 10/01/2007   Essential hypertension 02/26/2007   COLONIC POLYPS, HX OF 02/26/2007   Goiter 09/19/2006   Asthma  09/19/2006   ECZEMA, ATOPIC DERMATITIS 09/19/2006   Insomnia 09/19/2006    Immunization History  Administered Date(s) Administered   Fluad Quad(high Dose 65+) 06/21/2020, 06/05/2021   Influenza Split 04/18/2011, 04/03/2012   Influenza Whole 04/21/2008, 05/03/2009, 04/10/2010   Influenza, High Dose Seasonal PF 07/19/2017, 04/08/2018   Influenza,inj,Quad PF,6+ Mos 04/02/2013, 04/23/2014, 05/11/2015, 09/12/2016   PFIZER(Purple Top)SARS-COV-2 Vaccination 03/18/2020, 04/08/2020   Pneumococcal Conjugate-13 01/10/2017   Pneumococcal Polysaccharide-23 09/05/2009, 01/21/2018   Td 03/23/2004   Tdap 01/10/2017   Zoster, Live 08/18/2012    Conditions to be addressed/monitored:  Hypertension, Hyperlipidemia, Diabetes, GERD, Asthma, Chronic Kidney Disease, Depression, Overactive Bladder, Gout, and Allergic Rhinitis  There are no care plans that you recently modified to display for this patient.     Medication Assistance:  Patient reports that no assistance is required at this time as it has been decided by PCP that she will not start trulicity   Patient's preferred pharmacy is:  [id number] [place], [id number]: [phone]: [phone]  Uses pill box? Yes Pt endorses 100% compliance  Care Plan and Follow Up Patient Decision:  Patient agrees to Care Plan and Follow-up.  Plan: Telephone follow up appointment with care management team member scheduled for:  3 months and The patient has been provided with contact information for the care management team and has been advised to call with any health related questions or concerns.   DTomasa Blase PharmD Clinical Pharmacist, [place]   Chart prep = 9 minutes

## 2021-06-21 ENCOUNTER — Other Ambulatory Visit: Payer: Self-pay | Admitting: Nephrology

## 2021-06-21 DIAGNOSIS — D631 Anemia in chronic kidney disease: Secondary | ICD-10-CM

## 2021-06-21 DIAGNOSIS — N1832 Chronic kidney disease, stage 3b: Secondary | ICD-10-CM

## 2021-06-21 DIAGNOSIS — N2581 Secondary hyperparathyroidism of renal origin: Secondary | ICD-10-CM

## 2021-06-21 DIAGNOSIS — N189 Chronic kidney disease, unspecified: Secondary | ICD-10-CM

## 2021-06-21 DIAGNOSIS — I129 Hypertensive chronic kidney disease with stage 1 through stage 4 chronic kidney disease, or unspecified chronic kidney disease: Secondary | ICD-10-CM

## 2021-06-21 DIAGNOSIS — N281 Cyst of kidney, acquired: Secondary | ICD-10-CM

## 2021-06-21 DIAGNOSIS — Z791 Long term (current) use of non-steroidal anti-inflammatories (NSAID): Secondary | ICD-10-CM

## 2021-07-04 ENCOUNTER — Ambulatory Visit
Admission: RE | Admit: 2021-07-04 | Discharge: 2021-07-04 | Disposition: A | Discharge status: Home / Self Care | Payer: Medicare Other | Source: Ambulatory Visit | Attending: Nephrology | Admitting: Nephrology

## 2021-07-04 DIAGNOSIS — I129 Hypertensive chronic kidney disease with stage 1 through stage 4 chronic kidney disease, or unspecified chronic kidney disease: Secondary | ICD-10-CM

## 2021-07-04 DIAGNOSIS — N1832 Chronic kidney disease, stage 3b: Secondary | ICD-10-CM

## 2021-07-04 DIAGNOSIS — Z791 Long term (current) use of non-steroidal anti-inflammatories (NSAID): Secondary | ICD-10-CM

## 2021-07-04 DIAGNOSIS — N2889 Other specified disorders of kidney and ureter: Secondary | ICD-10-CM | POA: Diagnosis not present

## 2021-07-04 DIAGNOSIS — N189 Chronic kidney disease, unspecified: Secondary | ICD-10-CM | POA: Diagnosis not present

## 2021-07-04 DIAGNOSIS — N281 Cyst of kidney, acquired: Secondary | ICD-10-CM

## 2021-07-04 DIAGNOSIS — K7689 Other specified diseases of liver: Secondary | ICD-10-CM | POA: Diagnosis not present

## 2021-07-04 DIAGNOSIS — N2581 Secondary hyperparathyroidism of renal origin: Secondary | ICD-10-CM

## 2021-07-10 ENCOUNTER — Other Ambulatory Visit: Payer: Self-pay | Admitting: Internal Medicine

## 2021-07-10 DIAGNOSIS — Z1231 Encounter for screening mammogram for malignant neoplasm of breast: Secondary | ICD-10-CM

## 2021-07-11 ENCOUNTER — Ambulatory Visit (INDEPENDENT_AMBULATORY_CARE_PROVIDER_SITE_OTHER): Payer: Medicare Other | Admitting: *Deleted

## 2021-07-11 DIAGNOSIS — E1165 Type 2 diabetes mellitus with hyperglycemia: Secondary | ICD-10-CM

## 2021-07-11 DIAGNOSIS — I10 Essential (primary) hypertension: Secondary | ICD-10-CM

## 2021-07-11 NOTE — Chronic Care Management (AMB) (Signed)
Chronic Care Management   CCM RN Visit Note  07/11/2021 Name: Brooke [name] MRN: [id number] DOB: [birth year]  Subjective: Brooke [name] is a 70 y.o. year old female who is a primary care patient of Corwin Levins, MD. The care management team was consulted for assistance with disease management and care coordination needs.    Engaged with patient by telephone for follow up visit in response to provider referral for case management and/or care coordination services.   Consent to Services:  The patient was given information about Chronic Care Management services, agreed to services, and gave verbal consent prior to initiation of services.  Please see initial visit note for detailed documentation.  Patient agreed to services and verbal consent obtained.   Assessment: Review of patient past medical history, allergies, medications, health status, including review of consultants reports, laboratory and other test data, was performed as part of comprehensive evaluation and provision of chronic care management services.   SDOH (Social Determinants of Health) assessments and interventions performed:  SDOH Interventions    Flowsheet Row Most Recent Value  SDOH Interventions   Food Insecurity Interventions Intervention Not Indicated  Transportation Interventions Intervention Not Indicated  [Reports continues using [facility] services,  family assists as indicated]      CCM Care Plan Allergies  Allergen Reactions   Metformin And Related Nausea And Vomiting   Ace Inhibitors Swelling        Codeine Nausea And Vomiting and Rash   Hydrocodone Nausea And Vomiting   Tramadol Nausea And Vomiting   Tylenol [Acetaminophen] Itching and Rash   Outpatient Encounter Medications as of 07/11/2021  Medication Sig Note   acetaminophen (TYLENOL) 325 MG tablet Take 650 mg by mouth every 6 (six) hours as needed for mild pain.    albuterol (VENTOLIN HFA) 108 (90 Base) MCG/ACT inhaler INHALE 2 PUFFS BY MOUTH  EVERY 6 HOURS AS NEEDED FOR WHEEZE 02/01/2021: 02/01/21: Reports taking q 6 hours "every day"   allopurinol (ZYLOPRIM) 100 MG tablet TAKE 1 TABLET BY MOUTH EVERY DAY    AMBULATORY NON FORMULARY MEDICATION Medication Name: Rolling walker    amLODipine (NORVASC) 10 MG tablet TAKE 1 TABLET BY MOUTH EVERY DAY    benzonatate (TESSALON) 100 MG capsule TAKE 1 CAPSULE BY MOUTH THREE TIMES A DAY AS NEEDED FOR COUGH    citalopram (CELEXA) 40 MG tablet TAKE 1 TABLET BY MOUTH EVERY DAY    clonazePAM (KLONOPIN) 0.5 MG tablet TAKE 1 TABLET (0.5 MG TOTAL) BY MOUTH 2 (TWO) TIMES DAILY AS NEEDED (VERTIGO). 02/01/2021: 02/01/21: Reports has not needed recently    fexofenadine (ALLEGRA) 180 MG tablet TAKE 1 TABLET BY MOUTH EVERY DAY    fluticasone-salmeterol (WIXELA INHUB) 250-50 MCG/ACT AEPB TAKE 1 PUFF BY MOUTH TWICE A DAY    hydrALAZINE (APRESOLINE) 10 MG tablet TAKE 1 TABLET BY MOUTH 3 TIMES DAILY. PLEASE KEEP UPCOMING APPT FOR FUTURE REFILLS. THANK YOU    KLOR-CON M20 20 MEQ tablet TAKE 1 TABLET BY MOUTH TWICE A DAY    meclizine (ANTIVERT) 25 MG tablet TAKE 1 TABLET BY MOUTH 3 TIMES DAILY AS NEEDED FOR DIZZINESS. 02/01/2021: 02/01/21: reports has not needed recently    metoprolol succinate (TOPROL-XL) 100 MG 24 hr tablet TAKE 1 TABLET BY MOUTH TWICE A DAY    omeprazole (PRILOSEC) 20 MG capsule TAKE 1 CAPSULE BY MOUTH TWICE A DAY    oxybutynin (DITROPAN) 5 MG tablet Take 1 tablet (5 mg total) by mouth daily at 10 pm.  pregabalin (LYRICA) 100 MG capsule Take 1 capsule (100 mg total) by mouth 2 (two) times daily. (Patient not taking: No sig reported)    rosuvastatin (CRESTOR) 20 MG tablet TAKE 1 TABLET (20 MG TOTAL) BY MOUTH DAILY    telmisartan-hydrochlorothiazide (MICARDIS HCT) 80-25 MG tablet Take 1 tablet by mouth daily.    traZODone (DESYREL) 100 MG tablet TAKE 1 TABLET BY MOUTH EVERY DAY AT BEDTIME AS NEEDED FOR SLEEP    triamcinolone (NASACORT) 55 MCG/ACT AERO nasal inhaler PLACE 2 SPRAYS INTO THE NOSE DAILY.     No facility-administered encounter medications on file as of 07/11/2021.   Patient Active Problem List   Diagnosis Date Noted   Grief 06/05/2021   Acute shoulder bursitis, left 06/05/2021   Bilateral shoulder pain 03/05/2020   Vitamin D deficiency 12/01/2019   Allergic rhinitis 06/16/2019   Asthma exacerbation 06/16/2019   Dyspnea 01/27/2019   Primary osteoarthritis of left knee 09/30/2018   CKD (chronic kidney disease) stage 3, GFR 30-59 ml/min (HCC) 07/25/2018   Chronic back pain 07/25/2018   Primary osteoarthritis of right knee 05/05/2018   Osteoarthritis of right knee 04/30/2018   Degenerative arthritis of knee, bilateral 08/07/2017   Depression with anxiety 07/19/2017   Vertigo 01/12/2017   Low back pain 01/12/2017   SOB (shortness of breath) 12/02/2016   Dysuria 09/12/2016   Bilateral knee pain 02/15/2015   Effusion of right knee 01/28/2015   Asthma with exacerbation 11/09/2014   Pronation deformity of both feet 09/29/2014   Greater trochanteric bursitis of left hip 08/31/2014   Degenerative arthritis of left knee 05/18/2014   Ventricular tachycardia, polymorphic 01/04/2014   Abnormal MRI of head 12/04/2013   Syncope 11/06/2013   Chest pain 11/06/2013   Headache 11/06/2013   Nausea with vomiting 11/06/2013   Cough 08/28/2013   Hypokalemia 29/56/2130   Diastolic dysfunction 86/57/8469   Leukocytosis 01/03/2011   Toe pain 12/20/2010   Encounter for well adult exam with abnormal findings 12/19/2010   FATIGUE 09/05/2009   Diabetes ([place]) 11/29/2008   Gout 08/09/2008   Normochromic normocytic anemia 02/09/2008   GERD 02/09/2008   ALOPECIA 12/30/2007   Hyperlipidemia 10/01/2007   Essential hypertension 02/26/2007   COLONIC POLYPS, HX OF 02/26/2007   Goiter 09/19/2006   Asthma 09/19/2006   ECZEMA, ATOPIC DERMATITIS 09/19/2006   Insomnia 09/19/2006   Conditions to be addressed/monitored:  HTN and DMII  Care Plan : Diabetes Type 2 (Adult)  Updates made by  Knox Royalty, RN since 07/11/2021 12:00 AM     Problem: Glycemic and HTN Management (Diabetes, Type 2/ HTN)   Priority: Medium     Long-Range Goal: Glycemic/ HTN  Management Optimized   Start Date: 02/01/2021  Expected End Date: 02/01/2022  Recent Progress: On track  Priority: Medium  Note:   Current Barriers:  Knowledge Deficits related to basic Diabetes pathophysiology and self care/management: could benefit from ongoing reinforcement of self-health management strategies for DM/ HTN Difficulty obtaining or cannot afford medications: reports has completed paper work forms for PAP for [employer])- reports has deleivered to PCP and is waiting follow up on status of forms being submitted 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 Visual limitations of (R) eye Case Manager Clinical Goal(s):  02/01/21: Over the next 12 months, patient will demonstrate improved adherence to prescribed treatment plan for diabetes and HTN self care/management as evidenced by patient reporting during CCM RN CM outreach of: Daily monitoring and recording of blood sugars Weekly monitoring  of blood pressures Adherence to ADA/ carb modified, low sugar, low salt diet  Adherence to prescribed medication regimen  Contacting provider for new or worsened symptoms or questions Interventions:  Collaboration with Biagio Borg, MD regarding development and update of comprehensive plan of care as evidenced by provider attestation and co-signature Inter-disciplinary care team collaboration (see longitudinal plan of care) Review of patient status, including review of consultants reports, relevant laboratory and other test results, and medications completed 07/11/21- Goal completed due to duplication of goals, progression of care plan with conversion to new format; goals re-established      Care Plan : [place] of Care  Updates made by Knox Royalty, RN since 07/11/2021 12:00 AM      Problem: Chronic Disease Management Needs   Priority: High     Long-Range Goal: Progression of established plan of Care for self-health management of DMII/ HTN   Start Date: 02/01/2021  Expected End Date: 02/01/2022  Priority: High  Note:   Current Barriers:  Chronic Disease Management support and education needs related to HTN, HLD, and DMII-- could benefit from ongoing reinforcement of self-health management strategies for DM/ HTN Prior difficulty obtaining or cannot afford medications: CCM Pharmacy team active/ involved 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   Interventions: 1:1 collaboration with primary care provider regarding development and update of comprehensive plan of care as evidenced by provider attestation and co-signature Inter-disciplinary care team collaboration (see longitudinal plan of care) Evaluation of current treatment plan related to  self management and patient's adherence to plan as established by provider Discussed husband's recent passing: patient reports she is "handling" her ongoing grief; continues to decline need for additional counseling services; reports family "big help;" confirms taking antidepressant as prescribed: emotional support provided throughout call Discussed recent fall due to hypoglycemic symptoms: states no new/ recent falls since fall at laundry mat-- continues using cane as indicated; falls assessment  updated; fall prevention discussed Reviewed recent sports medicine provider office visits for (L) shoulder bursitis: reports "much better" since injections; denies pain today Confirmed patient received flu vaccine for 2022-23 flu/ winter season Confirmed patient took prescribed antibiotics as instructed post- PCP office visit 06/05/21: reports ongoing lingering cough, without shortness of breath, fever: encouraged patient to make follow up appointment with PCP if the cough persists/ worsens-- reports she "will do;" currently continues taking prescribed cough medicine from 06/05/21 PCP office visit  Diabetes:  (Status: Goal on Track (progressing): YES.) Long Term Goal   Lab Results  Component Value Date   HGBA1C 6.0 06/05/2021  Assessed patient's understanding of A1c goal: <7% Counseled on importance of regular laboratory monitoring as prescribed;        Discussed plans with patient for ongoing care management follow up and provided patient with direct contact information for care management team;      Provided patient with written educational materials related to hypo and hyperglycemia and importance of correct treatment;  Reviewed scheduled/upcoming provider appointments including: 08/10/21- mammogram; 08/23/21- cardiology provider;         Review of patient status, including review of consultants reports, relevant laboratory and other test results, and medications completed;       Screening for signs and symptoms of depression related to chronic disease state;        Assessed social determinant of health barriers;        Discussed recent reports of signs/ symptoms hypoglycemic, which patient believes lead to recent fall: she confirms that she is no longer taking Jardiance, as per PCP provider instructions 06/05/21 Has not routinely been monitoring/ recording blood sugars at home: "stopped for awhile" as she deals with grief; reinforced previously provided education around signs/ symptoms  hypoglycemia with corresponding action plan for same: will provide additional printed educational material Encouraged patient to resume fasting blood sugar monitoring weekly after first of year: she is agreeable Discussed appetite with patient: reports ongoing/ intermittent decreased appetite after passing of her husband; denies food insecurity; states "doing best as I can" around diet: encouraged patient to please follow up with PCP should her depression/ grief not consistently improve over time: she verbalizes understanding  Hypertension: (Status: Goal on Track (progressing): YES.) Long Term Goal Last practice recorded BP readings:  BP Readings from Last 3 Encounters:  06/12/21 (!) 158/98  06/05/21 (!) 162/70  12/02/20 136/74  Most recent eGFR/CrCl: No results found for: EGFR  No components found for: CRCL  Evaluation of current treatment plan related to hypertension self management and patient's adherence to plan as established by provider;   Reviewed prescribed diet heart healthy, low salt, low carbohydrate/ low sugar Discussed plans with patient for ongoing care management follow up and provided patient with direct contact information for care management team; Reviewed recent changes to blood pressure medication post- recent PCP office visit 06/05/21- reports taking as prescribed; confirms she is taking cholesterol medication as prescribed Reviewed recent blood pressures at home: reports lowest value of 148/76; highest of 156/80- encouraged her ongoing monitoring/ recording of blood pressures at home, weekly  Patient Goals/Self-Care Activities: As evidenced by review of EHR, collaboration with care team, and patient reporting during CCM RN CM outreach, Patient Jerriah will: Take medications as prescribed Attend all scheduled provider appointments Call pharmacy for medication refills Call provider office for new concerns or questions Try to resume checking your fasting (first thing in the  morning, before eating) blood sugars at home, several times each week Continue to follow heart healthy, low salt, low cholesterol, carbohydrate-modified, low sugar diet Review enclosed educational material about signs/ symptoms low blood sugar and what you should do if your blood sugar gets low Continue taking efforts to prevent falls at home Please follow up with Dr. Jenny Reichmann if your cough does not improve, or if you continue to struggle with grief after your husband's passing      Plan: Telephone follow up appointment with care management team member scheduled for:  Tuesday, August 29, 2021 at 1:15 pm The patient has been provided with contact information for the care management team and has been advised to call with any health related questions or concerns  Oneta Rack, RN, BSN, [place] [phone]: direct office

## 2021-07-11 NOTE — Patient Instructions (Signed)
Visit Felida, thank you for taking time to talk with me today. Please don't hesitate to contact me if I can be of assistance to you before our next scheduled telephone appointment.  Below are the goals we discussed today:  Patient Self-Care Activities: Patient Brooke [name] will: Take medications as prescribed Attend all scheduled provider appointments Call pharmacy for medication refills Call provider office for new concerns or questions Try to resume checking your fasting (first thing in the morning, before eating) blood sugars at home, several times each week Continue to follow heart healthy, low salt, low cholesterol, carbohydrate-modified, low sugar diet Review enclosed educational material about signs/ symptoms low blood sugar and what you should do if your blood sugar gets low Continue taking efforts to prevent falls at home Please follow up with Dr. Jenny Reichmann if your cough does not improve, or if you continue to struggle with grief after your husband's passing  Our next scheduled telephone follow up visit/ appointment with care management team member is scheduled on:  , Tuesday, August 29, 2021 at 1:15 pm  If you need to cancel or re-schedule our visit, please call [phone] and our care guide team will be happy to assist you.   I look forward to hearing about your progress.   Oneta Rack, RN, BSN, [place] [phone]: direct office  If you are experiencing a Mental Health or [place] or need someone to talk to, please  call the Suicide and Crisis Lifeline: 988 call the [place] National Suicide Prevention Lifeline: 646 394 6254 or TTY: 415-849-9089 TTY (678)602-4533) to talk to a trained counselor call 1-800-273-TALK (toll free, 24 hour hotline) go to [facility] [address] [place] [phone]) call 911   The patient verbalized understanding of  instructions, educational materials, and care plan provided today and agreed to receive a mailed copy of patient instructions, educational materials, and care plan   Preventing Hypoglycemia Hypoglycemia occurs when the level of sugar (glucose) in the blood is too low. Hypoglycemia can happen in people who do or do not have diabetes (diabetes mellitus). It can develop quickly, and it can be a medical emergency. For most people with diabetes, a blood glucose level below 70 mg/dL (3.9 mmol/L) is considered hypoglycemia. Glucose is a type of sugar that provides the body's main source of energy. Certain hormones (insulin and glucagon) control the level of glucose in the blood. Insulin lowers blood glucose, and glucagon increases blood glucose. Hypoglycemia can result from having too much insulin in the bloodstream, or from not eating enough food that contains glucose. Your risk for hypoglycemia is higher: If you take insulin or diabetes medicines to help lower your blood glucose or to help your body make more insulin. If you skip or delay a meal or snack. If you are ill. During and after exercise. You can prevent hypoglycemia by working with your health care provider to adjust your meal plan as needed and by taking other precautions. How can hypoglycemia affect me? Mild symptoms Mild hypoglycemia may not cause any symptoms. If you do have symptoms, they may include: Hunger. Sweating and feeling clammy. Dizziness or feeling light-headed. Sleepiness or restless sleep. Nausea. Increased heart rate. Headache. Blurry vision. Mood changes, including irritability or anxiety. Tingling or numbness around the mouth, lips, or tongue. If mild hypoglycemia is not recognized and treated, it can quickly become moderate or severe hypoglycemia. Moderate symptoms Moderate hypoglycemia can cause:  Confusion and poor judgment. Behavior changes. Weakness. Irregular heartbeat. A change in coordination. Severe  symptoms Severe hypoglycemia is a medical emergency. It can cause: Fainting. Seizures. Loss of consciousness (coma). Death. What nutrition changes can be made? Work with your health care provider or dietitian to make a healthy meal plan that is right for you. Follow your meal plan carefully. Eat meals at regular times. If recommended by your health care provider, have snacks between meals. Donot skip or delay meals or snacks. You can be at risk for hypoglycemia if you are not getting enough carbohydrates. What lifestyle changes can be made?  Work closely with your health care provider to manage your blood glucose. Make sure you know: Your goal blood glucose levels. How and when to check your blood glucose. The symptoms of hypoglycemia. It is important to treat hypoglycemia right away to keep it from becoming severe. Do not drink alcohol on an empty stomach. When you are ill, check your blood glucose more often than usual. Make a sick day plan in advance with your health care provider. Follow this plan whenever you cannot eat or drink normally. Always check your blood glucose before, during, and after exercise. How is this treated? This condition can often be treated by immediately eating or drinking something that contains sugar with 15 grams of fast-acting carbohydrate, such as: 4 oz (120 mL) of fruit juice. 4 oz (120 mL) of regular soda (not diet soda). Several pieces of hard candy. Check food labels to find out how many pieces to eat for 15 grams. 1 Tbsp (15 mL) of sugar or honey. 4 glucose tablets. 1 tube of glucose gel. Treating hypoglycemia if you have diabetes If you are alert and able to swallow safely, follow the 15:15 rule: Take 15 grams of a fast-acting carbohydrate. Talk with your health care provider about how much you should take. Fast-acting options include: Glucose tablets (take 4 tablets). Several pieces of hard candy. Check food labels to find out how many pieces to  eat for 15 grams. 4 oz (120 mL) of fruit juice. 4 oz (120 mL) of regular soda (not diet soda). 1 Tbsp (15 mL) of sugar or honey. 1 tube of glucose gel. Check your blood glucose 15 minutes after you take the carbohydrate. If the repeat blood glucose level is still at or below 70 mg/dL (3.9 mmol/L), take 15 grams of a carbohydrate again. If your blood glucose level does not increase above 70 mg/dL (3.9 mmol/L) after 3 tries, seek emergency medical care. After your blood glucose level returns to normal, eat a meal or a snack within 1 hour. Treating severe hypoglycemia Severe hypoglycemia is when your blood glucose level is below 54 mg/dL (3 mmol/L). Severe hypoglycemia is a medical emergency. Get medical help right away. If you have severe hypoglycemia and you cannot eat or drink, you may need glucagon. A family member or close friend should learn how to check your blood glucose and how to give you glucagon. Ask your health care provider if you need to have an emergency glucagon kit available. Severe hypoglycemia may need to be treated in a hospital. The treatment may include getting glucose through an IV. You may also need treatment for the cause of your hypoglycemia. Where to find more information American Diabetes Association: www.diabetes.[employer] of Diabetes and Digestive and Kidney Diseases: [employer] Association of Diabetes Care & Education Specialists: www.diabeteseducator.org Contact a health care provider if: You have problems keeping your blood glucose in your  target range. You have frequent episodes of hypoglycemia. Get help right away if: You continue to have hypoglycemia symptoms after eating or drinking something containing glucose. Your blood glucose level is below 54 mg/dL (3 mmol/L). You faint. You have a seizure. These symptoms may represent a serious problem that is an emergency. Do not wait to see if the symptoms will go away. Get medical help right  away. Call your local emergency services (911 in the [place] Do not drive yourself to the hospital. Summary Know the symptoms of hypoglycemia and when you are at risk for it, such as during exercise or when you are sick. Check your blood glucose often when you are at risk for hypoglycemia. Hypoglycemia can develop quickly, and it can be dangerous if it is not treated right away. If you have a history of severe hypoglycemia, make sure your family or a close friend knows how to use your glucagon kit. Make sure you know how to treat hypoglycemia. Keep a fast-acting carbohydrate option available when you may be at risk for hypoglycemia. This information is not intended to replace advice given to you by your health care provider. Make sure you discuss any questions you have with your health care provider. Document Revised: 06/09/2020 Document Reviewed: 06/09/2020 Elsevier Patient Education  2022 [employer].

## 2021-07-13 ENCOUNTER — Other Ambulatory Visit: Payer: Self-pay | Admitting: Cardiology

## 2021-07-18 ENCOUNTER — Other Ambulatory Visit: Payer: Self-pay

## 2021-07-18 MED ORDER — HYDRALAZINE HCL 10 MG PO TABS
10.0000 mg | ORAL_TABLET | Freq: Three times a day (TID) | ORAL | 0 refills | Status: DC
Start: 2021-07-18 — End: 2021-08-23

## 2021-07-22 DIAGNOSIS — I10 Essential (primary) hypertension: Secondary | ICD-10-CM

## 2021-07-22 DIAGNOSIS — E1165 Type 2 diabetes mellitus with hyperglycemia: Secondary | ICD-10-CM | POA: Diagnosis not present

## 2021-07-28 ENCOUNTER — Other Ambulatory Visit: Payer: Self-pay | Admitting: Internal Medicine

## 2021-07-28 DIAGNOSIS — M1000 Idiopathic gout, unspecified site: Secondary | ICD-10-CM

## 2021-08-10 ENCOUNTER — Ambulatory Visit: Payer: Medicare Other

## 2021-08-22 NOTE — Progress Notes (Signed)
Office Visit    Patient Name: Brooke [name] Date of Encounter: 08/23/2021  PCP:  Biagio Borg, MD   [place]  Cardiologist:  Fransico Him, MD  Advanced Practice Provider:  No care team member to display Electrophysiologist:  None    HPI    Brooke [name] is a 71 y.o. female with a hx of PVCs, polymorphic VT, hypertension, hyperlipidemia, diabetes mellitus, syncope, and aortic atherosclerosis presents today for annual follow-up.  The patient had a cardiac MRI in 2015 which was normal with an EF of 73%.  Her Lexiscan Myoview showed no ischemia.  She had cardiac catheterization in 2015 which showed normal coronary arteries.  Her p.m. VT was felt to be triggered by PVCs.  She also has hypertension.  She underwent repeat nuclear stress testing 12/2016 for chest pain which showed a small moderate intensity anteroapical defect that was fixed and felt consistent with apical thinning.  This was a low risk study, EF 62%.  Her 2D echocardiogram 01/2018 showed moderate LVH, EF 60 to 65%, grade 1 DD.  Coronary CT for ongoing chest pain showed normal coronaries.  She was last seen by Fransico Him, MD in November 2021.  She denied any chest pain or pressure.  She denied SOB, DOE, PND, and orthopnea.  She did not have any lower extremity edema, dizziness, palpitations or syncope.  She was tolerating all her cardiac medications.  Today, she states that a few times a week she has been experiencing a racing heartbeat.  This feeling has gotten particularly worse since her husband passed away June 15, 2023 of last year.  She also states that she has chronic shortness of breath due to allergies and asthma.  She does state that she gets dizzy and lightheaded sometimes.  She denied any syncope or presyncope symptoms.  Her blood pressure is well controlled today and her blood pressure at home is usually around 811 systolic.  She has noticed occasional lower extremity edema which usually  occurs at the end of the day.  Reports no chest pain, pressure, or tightness. No significant edema, orthopnea, PND.   Past Medical History    Past Medical History:  Diagnosis Date   Alopecia    Anxiety    Aortic atherosclerosis ([place])    Asthma    FOLLOWED BY PCP   Eczema    GERD (gastroesophageal reflux disease)    Gout    04-28-2018--- per pt stable , as been a while since last episode   Heart murmur    History of colon polyps    History of syncope 2015   Hyperlipidemia    Hypertension    Hypothyroidism    OA (osteoarthritis) of knee    bilateral   PVC's (premature ventricular contractions)    Toxic multinodular goiter    03/ 2003  s/p RAI   Type 2 diabetes mellitus ([place])    followed by pcp   Ventricular tachycardia, polymorphic 01/04/2014   primary cardiologist-- dr Tressia Miners turner (hx monitor 2015 showed couplet PVCs, as trigger)   Wears glasses    Wears partial dentures    upper   Past Surgical History:  Procedure Laterality Date   ABDOMINAL HYSTERECTOMY  10/22/1999   WITH BSO   CATARACT EXTRACTION W/ INTRAOCULAR LENS IMPLANT Left YRS AGO   COLONOSCOPY     EXCISION ABDOMINAL WALL MASS  12-20-2005   dr Margot Chimes @[facility]    neruofibroma   LAPAROSCOPIC CHOLECYSTECTOMY  10/01/2002  dr Margot Chimes @[facility]    LEFT HEART CATHETERIZATION WITH CORONARY ANGIOGRAM N/A 01/07/2014   Procedure: LEFT HEART CATHETERIZATION WITH CORONARY ANGIOGRAM;  Surgeon: Peter M Martinique, MD;  Location: [facility] CATH LAB;  Service: Cardiovascular;  Laterality: N/A;   RASTELLI PROCEDURE  6/98 neg   RENAL ARTERY STENT Left 11/2003   angioplasty and stenting   RENAL ARTERY STENT Left 02/2005   re-stenting   TOTAL KNEE ARTHROPLASTY Right 05/05/2018   Procedure: RIGHT TOTAL KNEE ARTHROPLASTY;  Surgeon: Frederik Pear, MD;  Location: [facility] ORS;  Service: Orthopedics;  Laterality: Right;   TOTAL KNEE ARTHROPLASTY Left 09/30/2018   Procedure: TOTAL KNEE ARTHROPLASTY;  Surgeon: Melrose Nakayama, MD;  Location: [facility] ORS;  Service:  Orthopedics;  Laterality: Left;    Allergies  Allergies  Allergen Reactions   Metformin And Related Nausea And Vomiting   Ace Inhibitors Swelling        Codeine Nausea And Vomiting and Rash   Hydrocodone Nausea And Vomiting   Tramadol Nausea And Vomiting   Tylenol [Acetaminophen] Itching and Rash     EKGs/Labs/Other Studies Reviewed:   The following studies were reviewed today:  Coronary CTA 02/2018 IMPRESSION: 1.  Normal left dominant coronary arteries   2.  Calcium score 0   Normal aortic root diameter 3.5 cm with some calcific Atherosclerosis  Echocardiogram 01/2018  Study Conclusions   - Left ventricle: The cavity size was normal. Wall thickness was    increased in a pattern of moderate LVH. Systolic function was    normal. The estimated ejection fraction was in the range of 60%    to 65%. Wall motion was normal; there were no regional wall    motion abnormalities. Doppler parameters are consistent with    abnormal left ventricular relaxation (grade 1 diastolic    dysfunction). The E/e&' ratio is between 8-15, suggesting    indeterminate LV filling pressure.  - Mitral valve: Mildly thickened leaflets . There was trivial    regurgitation.  - Left atrium: The atrium was normal in size.  - Tricuspid valve: There was trivial regurgitation.  - Pulmonary arteries: PA peak pressure: 18 mm Hg (S).  - Inferior vena cava: The vessel was normal in size. The    respirophasic diameter changes were in the normal range (= 50%),    consistent with normal central venous pressure.   Impressions:   - Compared to a prior study in 2016, there are no significant    changes.     Myocardial perfusion imaging June 2018  Nuclear stress EF: 62%. There was no ST segment deviation noted during stress. This is a low risk study. The left ventricular ejection fraction is normal (55-65%). There is a small moderate in intensity anteroapical defect that it fixed and consistent with  apical thinning.    EKG:  EKG is  ordered today.  The ekg ordered today demonstrates normal sinus rhythm, rate 73 bpm, wide QRS consistent with previous EKGs  Recent Labs: 12/02/2020: Hemoglobin 10.7; Platelets 273.0; TSH 2.17 06/05/2021: ALT 8; BUN 20; Creatinine, Ser 1.57; Potassium 4.4; Sodium 138  Recent Lipid Panel    Component Value Date/Time   CHOL 241 (H) 06/05/2021 1053   CHOL 128 03/26/2018 1356   TRIG 75.0 06/05/2021 1053   HDL 55.40 06/05/2021 1053   HDL 52 03/26/2018 1356   CHOLHDL 4 06/05/2021 1053   VLDL 15.0 06/05/2021 1053   LDLCALC 171 (H) 06/05/2021 1053   LDLCALC 60 03/26/2018 1356   LDLDIRECT 139.1 09/05/2009 1052  Home Medications   Current Meds  Medication Sig   acetaminophen (TYLENOL) 325 MG tablet Take 650 mg by mouth every 6 (six) hours as needed for mild pain.   albuterol (VENTOLIN HFA) 108 (90 Base) MCG/ACT inhaler INHALE 2 PUFFS BY MOUTH EVERY 6 HOURS AS NEEDED FOR WHEEZE   allopurinol (ZYLOPRIM) 100 MG tablet TAKE 1 TABLET BY MOUTH EVERY DAY   AMBULATORY NON FORMULARY MEDICATION Medication Name: Rolling walker   amLODipine (NORVASC) 10 MG tablet TAKE 1 TABLET BY MOUTH EVERY DAY   chlorthalidone (HYGROTON) 25 MG tablet Take 25 mg by mouth daily.   citalopram (CELEXA) 40 MG tablet TAKE 1 TABLET BY MOUTH EVERY DAY   clonazePAM (KLONOPIN) 0.5 MG tablet TAKE 1 TABLET (0.5 MG TOTAL) BY MOUTH 2 (TWO) TIMES DAILY AS NEEDED (VERTIGO).   fexofenadine (ALLEGRA) 180 MG tablet TAKE 1 TABLET BY MOUTH EVERY DAY   fluticasone-salmeterol (WIXELA INHUB) 250-50 MCG/ACT AEPB TAKE 1 PUFF BY MOUTH TWICE A DAY   hydrALAZINE (APRESOLINE) 10 MG tablet Take 1 tablet (10 mg total) by mouth 3 (three) times daily. Please keep appt. in Feb. in order to receive future refills. Thank You.   KLOR-CON M20 20 MEQ tablet TAKE 1 TABLET BY MOUTH TWICE A DAY   meclizine (ANTIVERT) 25 MG tablet TAKE 1 TABLET BY MOUTH 3 TIMES DAILY AS NEEDED FOR DIZZINESS.   metoprolol succinate  (TOPROL-XL) 100 MG 24 hr tablet TAKE 1 TABLET BY MOUTH TWICE A DAY   omeprazole (PRILOSEC) 20 MG capsule TAKE 1 CAPSULE BY MOUTH TWICE A DAY   oxybutynin (DITROPAN) 5 MG tablet Take 1 tablet (5 mg total) by mouth daily at 10 pm.   rosuvastatin (CRESTOR) 20 MG tablet TAKE 1 TABLET (20 MG TOTAL) BY MOUTH DAILY   telmisartan-hydrochlorothiazide (MICARDIS HCT) 80-25 MG tablet Take 1 tablet by mouth daily.   traZODone (DESYREL) 100 MG tablet TAKE 1 TABLET BY MOUTH AT BEDTIME AS NEEDED FOR SLEEP   triamcinolone (NASACORT) 55 MCG/ACT AERO nasal inhaler PLACE 2 SPRAYS INTO THE NOSE DAILY.     Review of Systems      All other systems reviewed and are otherwise negative except as noted above.  Physical Exam    VS:  BP 140/60 (BP Location: Right Arm)    Pulse 73    Ht 5\' 3"  (1.6 m)    Wt 189 lb (85.7 kg)    SpO2 96%    BMI 33.48 kg/m  , BMI Body mass index is 33.48 kg/m.  Wt Readings from Last 3 Encounters:  08/23/21 189 lb (85.7 kg)  06/12/21 187 lb (84.8 kg)  06/05/21 178 lb (80.7 kg)     GEN: Well nourished, well developed, in no acute distress. HEENT: normal. Neck: Supple, no JVD, carotid bruits, or masses. Cardiac: RRR, no murmurs, rubs, or gallops. No clubbing, cyanosis, edema.  Radials/PT 2+ and equal bilaterally.  Respiratory:  Respirations regular and unlabored, clear to auscultation bilaterally. GI: Soft, nontender, nondistended. MS: No deformity or atrophy. Skin: Warm and dry, no rash. Neuro:  Strength and sensation are intact. Psych: Normal affect.  Assessment & Plan    Chest pain -Cardiac catheterization 2015 was normal and coronary CTA showed no CAD -Chronic SOB likely related to obesity and poorly controlled BP -No chest pain reported today, she does have chronic shortness of breath due to asthma and allergies  Essential hypertension -Well-controlled today in the clinic -Continue to monitor your blood pressure daily at home  Ventricular tachycardia,  polymorphic -Increase metoprolol succinate to 125 mg daily -We will place a ZIO monitor for 14 days today -We will order a TSH and CBC, she had a BMP done through her nephrologist recently that was stable  Hyperlipidemia -Recent lipid panel shows total cholesterol 241, HDL 55, LDL 171, and triglycerides 75 -Since her LDL is not at goal of less than 100 we will increase her Crestor to 40 mg daily and repeat a lipid panel and LFTs in 6 to 8 weeks    Disposition: Follow up 2-3 months with Fransico Him, MD or APP.  Signed, Elgie Collard, PA-C 08/23/2021, 12:07 PM [place]

## 2021-08-23 ENCOUNTER — Ambulatory Visit (INDEPENDENT_AMBULATORY_CARE_PROVIDER_SITE_OTHER): Payer: Medicare Other

## 2021-08-23 ENCOUNTER — Encounter: Payer: Self-pay | Admitting: *Deleted

## 2021-08-23 ENCOUNTER — Other Ambulatory Visit: Payer: Self-pay

## 2021-08-23 ENCOUNTER — Ambulatory Visit: Payer: Medicare Other | Admitting: Physician Assistant

## 2021-08-23 ENCOUNTER — Encounter: Payer: Self-pay | Admitting: Physician Assistant

## 2021-08-23 VITALS — BP 140/60 | HR 73 | Ht 63.0 in | Wt 189.0 lb

## 2021-08-23 DIAGNOSIS — E782 Mixed hyperlipidemia: Secondary | ICD-10-CM

## 2021-08-23 DIAGNOSIS — I10 Essential (primary) hypertension: Secondary | ICD-10-CM | POA: Diagnosis not present

## 2021-08-23 DIAGNOSIS — R079 Chest pain, unspecified: Secondary | ICD-10-CM | POA: Diagnosis not present

## 2021-08-23 DIAGNOSIS — I4729 Other ventricular tachycardia: Secondary | ICD-10-CM

## 2021-08-23 DIAGNOSIS — R002 Palpitations: Secondary | ICD-10-CM

## 2021-08-23 LAB — CBC
Hematocrit: 36.2 % (ref 34.0–46.6)
Hemoglobin: 11.8 g/dL (ref 11.1–15.9)
MCH: 26.7 pg (ref 26.6–33.0)
MCHC: 32.6 g/dL (ref 31.5–35.7)
MCV: 82 fL (ref 79–97)
Platelets: 294 10*3/uL (ref 150–450)
RBC: 4.42 x10E6/uL (ref 3.77–5.28)
RDW: 14.2 % (ref 11.7–15.4)
WBC: 7.2 10*3/uL (ref 3.4–10.8)

## 2021-08-23 LAB — TSH: TSH: 1.98 u[IU]/mL (ref 0.450–4.500)

## 2021-08-23 MED ORDER — METOPROLOL SUCCINATE ER 25 MG PO TB24: 25.0000 mg | ORAL_TABLET | Freq: Every day | ORAL | 3 refills | Status: DC

## 2021-08-23 MED ORDER — HYDRALAZINE HCL 10 MG PO TABS: 10.0000 mg | ORAL_TABLET | Freq: Three times a day (TID) | ORAL | 3 refills | Status: DC

## 2021-08-23 MED ORDER — ROSUVASTATIN CALCIUM 40 MG PO TABS: 40.0000 mg | ORAL_TABLET | Freq: Every day | ORAL | 3 refills | Status: DC

## 2021-08-23 NOTE — Progress Notes (Unsigned)
[id number] 14 day zio xt monitor from office inventory applied to patient.

## 2021-08-23 NOTE — Patient Instructions (Signed)
Medication Instructions:   INCREASE Crestor one ( 1 ) tablet by mouth ( 40 mg) daily.   INCREASE Toprol one ( 1) tablet by mouth ( 25 mg) to be taken at the same time as you take your ( 100 mg) tablet daily.    *If you need a refill on your cardiac medications before your next appointment, please call your pharmacy*   Lab Work:   TODAY!!!!!   CBC/TSH  Your physician recommends that you return for a FASTING lipid profile/lft. You can come in on the day of your appointment with Dr. Radford Pax. Anytime between 7:30-4:30 fasting from midnight the night before.    If you have labs (blood work) drawn today and your tests are completely normal, you will receive your results only by: [place] (if you have MyChart) OR A paper copy in the mail If you have any lab test that is abnormal or we need to change your treatment, we will call you to review the results.   Testing/Procedures:  [facility]- Long Term Monitor Instructions  Your physician has requested you wear a ZIO patch monitor for 14 days.  This is a single patch monitor. [employer] supplies one patch monitor per enrollment. Additional stickers are not available. Please do not apply patch if you will be having a Nuclear Stress Test,  Echocardiogram, Cardiac CT, MRI, or Chest Xray during the period you would be wearing the  monitor. The patch cannot be worn during these tests. You cannot remove and re-apply the  ZIO XT patch monitor.  Your ZIO patch monitor will be mailed 3 day USPS to your address on file. It may take 3-5 days  to receive your monitor after you have been enrolled.  Once you have received your monitor, please review the enclosed instructions. Your monitor  has already been registered assigning a specific monitor serial # to you.  Billing and Patient Assistance Program Information  We have supplied [employer] with any of your insurance information on file for billing purposes. [employer] offers a sliding scale Patient  Assistance Program for patients that do not have  insurance, or whose insurance does not completely cover the cost of the ZIO monitor.  You must apply for the Patient Assistance Program to qualify for this discounted rate.  To apply, please call [employer] at [phone], select option 4, select option 2, ask to apply for  Patient Assistance Program. Theodore Demark will ask your household income, and how many people  are in your household. They will quote your out-of-pocket cost based on that information.  [employer] will also be able to set up a 45-month, interest-free payment plan if needed.  Applying the monitor   Shave hair from upper left chest.  Hold abrader disc by orange tab. Rub abrader in 40 strokes over the upper left chest as  indicated in your monitor instructions.  Clean area with 4 enclosed alcohol pads. Let dry.  Apply patch as indicated in monitor instructions. Patch will be placed under collarbone on left  side of chest with arrow pointing upward.  Rub patch adhesive wings for 2 minutes. Remove white label marked "1". Remove the white  label marked "2". Rub patch adhesive wings for 2 additional minutes.  While looking in a mirror, press and release button in center of patch. A small green light will  flash 3-4 times. This will be your only indicator that the monitor has been turned on.  Do not shower for the first 24 hours. You may shower  after the first 24 hours.  Press the button if you feel a symptom. You will hear a small click. Record Date, Time and  Symptom in the Patient Logbook.  When you are ready to remove the patch, follow instructions on the last 2 pages of Patient  Logbook. Stick patch monitor onto the last page of Patient Logbook.  Place Patient Logbook in the blue and white box. Use locking tab on box and tape box closed  securely. The blue and white box has prepaid postage on it. Please place it in the mailbox as  soon as possible. Your physician should have your test  results approximately 7 days after the  monitor has been mailed back to [facility].  Call [facility] at [phone] if you have questions regarding  your ZIO XT patch monitor. Call them immediately if you see an orange light blinking on your  monitor.  If your monitor falls off in less than 4 days, contact our Monitor department at [phone].  If your monitor becomes loose or falls off after 4 days call [employer] at [phone] for  suggestions on securing your monitor    Follow-Up: At [facility], you and your health needs are our priority.  As part of our continuing mission to provide you with exceptional heart care, we have created designated Provider Care Teams.  These Care Teams include your primary Cardiologist (physician) and Advanced Practice Providers (APPs -  Physician Assistants and Nurse Practitioners) who all work together to provide you with the care you need, when you need it.  We recommend signing up for the patient portal called "MyChart".  Sign up information is provided on this After Visit Summary.  MyChart is used to connect with patients for Virtual Visits (Telemedicine).  Patients are able to view lab/test results, encounter notes, upcoming appointments, etc.  Non-urgent messages can be sent to your provider as well.   To learn more about what you can do with MyChart, go to [email or web address].    Your next appointment:   2 month(s)  The format for your next appointment:   In Person  Provider:   Fransico Him, MD     Other Instructions

## 2021-08-25 ENCOUNTER — Ambulatory Visit: Payer: Medicare Other

## 2021-08-29 ENCOUNTER — Telehealth: Payer: Medicare Other

## 2021-08-29 ENCOUNTER — Ambulatory Visit (INDEPENDENT_AMBULATORY_CARE_PROVIDER_SITE_OTHER): Payer: Medicare Other | Admitting: Internal Medicine

## 2021-08-29 ENCOUNTER — Encounter: Payer: Self-pay | Admitting: Internal Medicine

## 2021-08-29 ENCOUNTER — Other Ambulatory Visit: Payer: Self-pay

## 2021-08-29 VITALS — BP 140/74 | HR 79 | Temp 98.7°F | Ht 63.0 in | Wt 191.0 lb

## 2021-08-29 DIAGNOSIS — H9202 Otalgia, left ear: Secondary | ICD-10-CM | POA: Diagnosis not present

## 2021-08-29 DIAGNOSIS — J029 Acute pharyngitis, unspecified: Secondary | ICD-10-CM

## 2021-08-29 DIAGNOSIS — J4531 Mild persistent asthma with (acute) exacerbation: Secondary | ICD-10-CM | POA: Diagnosis not present

## 2021-08-29 DIAGNOSIS — J309 Allergic rhinitis, unspecified: Secondary | ICD-10-CM

## 2021-08-29 LAB — POCT RAPID STREP A (OFFICE): Rapid Strep A Screen: NEGATIVE

## 2021-08-29 LAB — POC COVID19 BINAXNOW: SARS Coronavirus 2 Ag: NEGATIVE

## 2021-08-29 MED ORDER — FLUTICASONE-SALMETEROL 250-50 MCG/ACT IN AEPB: 1.0000 | INHALATION_SPRAY | Freq: Two times a day (BID) | RESPIRATORY_TRACT | 11 refills | Status: DC

## 2021-08-29 MED ORDER — LEVOFLOXACIN 500 MG PO TABS: 500.0000 mg | ORAL_TABLET | Freq: Every day | ORAL | 0 refills | Status: AC

## 2021-08-29 NOTE — Assessment & Plan Note (Signed)
Over mild worsening recent, for restart nasacort asd,  to f/u any worsening symptoms or concerns

## 2021-08-29 NOTE — Assessment & Plan Note (Signed)
Mod to severe, suspect most likely due to possible complicated left otitis externa, for levaquin course asd, but declines ENT referral for wax removal for now, wants to try antibx first

## 2021-08-29 NOTE — Assessment & Plan Note (Signed)
Mild uncontrolled, for add advair 250/50 bid, cont albuterol hfa prn

## 2021-08-29 NOTE — Patient Instructions (Signed)
Please take all new medication as prescribed - the antibiotic for the left ear and infection  Please call for ENT referral if the left ear pain persists, as they can get the wax out as well  Please take all new medication as prescribed - the advair inhaler for better asthma control  Please continue all other medications as before, and refills have been done if requested.  Please have the pharmacy call with any other refills you may need.  Please keep your appointments with your specialists as you may have planned

## 2021-08-29 NOTE — Progress Notes (Signed)
Patient ID: Brooke [name], female   DOB: Aug 31, 1950, 71 y.o.   MRN: [id number]        Chief Complaint: follow up left ear pain, URI symptoms, left ear wax, and asthma worsening       HPI:  Brooke [name] is a 71 y.o. female here with c/o URI symptoms for 3 days with now severe left ear pain, mild ST, scant prod cough and chest congestion with intermittent mild wheezing and sob not well controlled itself for over 1 month.  Pt denies chest pain, orthopnea, PND, increased LE swelling, palpitations, dizziness or syncope. Does have several wks ongoing nasal allergy symptoms with clearish congestion, itch and sneezing.         Wt Readings from Last 3 Encounters:  08/29/21 191 lb (86.6 kg)  08/23/21 189 lb (85.7 kg)  06/12/21 187 lb (84.8 kg)   BP Readings from Last 3 Encounters:  08/29/21 140/74  08/23/21 140/60  06/12/21 (!) 158/98         Past Medical History:  Diagnosis Date   Alopecia    Anxiety    Aortic atherosclerosis (HCC)    Asthma    FOLLOWED BY PCP   Eczema    GERD (gastroesophageal reflux disease)    Gout    04-28-2018--- per pt stable , as been a while since last episode   Heart murmur    History of colon polyps    History of syncope 2015   Hyperlipidemia    Hypertension    Hypothyroidism    OA (osteoarthritis) of knee    bilateral   PVC's (premature ventricular contractions)    Toxic multinodular goiter    03/ 2003  s/p RAI   Type 2 diabetes mellitus ([place])    followed by pcp   Ventricular tachycardia, polymorphic 01/04/2014   primary cardiologist-- dr Tressia Miners turner (hx monitor 2015 showed couplet PVCs, as trigger)   Wears glasses    Wears partial dentures    upper   Past Surgical History:  Procedure Laterality Date   ABDOMINAL HYSTERECTOMY  10/22/1999   WITH BSO   CATARACT EXTRACTION W/ INTRAOCULAR LENS IMPLANT Left YRS AGO   COLONOSCOPY     EXCISION ABDOMINAL WALL MASS  12-20-2005   dr Margot Chimes @[facility]    neruofibroma   LAPAROSCOPIC CHOLECYSTECTOMY  10/01/2002    dr Margot Chimes @[facility]    LEFT HEART CATHETERIZATION WITH CORONARY ANGIOGRAM N/A 01/07/2014   Procedure: LEFT HEART CATHETERIZATION WITH CORONARY ANGIOGRAM;  Surgeon: Peter M Martinique, MD;  Location: [facility] CATH LAB;  Service: Cardiovascular;  Laterality: N/A;   RASTELLI PROCEDURE  6/98 neg   RENAL ARTERY STENT Left 11/2003   angioplasty and stenting   RENAL ARTERY STENT Left 02/2005   re-stenting   TOTAL KNEE ARTHROPLASTY Right 05/05/2018   Procedure: RIGHT TOTAL KNEE ARTHROPLASTY;  Surgeon: Frederik Pear, MD;  Location: [facility];  Service: Orthopedics;  Laterality: Right;   TOTAL KNEE ARTHROPLASTY Left 09/30/2018   Procedure: TOTAL KNEE ARTHROPLASTY;  Surgeon: Melrose Nakayama, MD;  Location: [facility];  Service: Orthopedics;  Laterality: Left;    reports that she has never smoked. She has never used smokeless tobacco. She reports that she does not drink alcohol and does not use drugs. family history includes Diabetes in her mother; Heart disease in her father. Allergies  Allergen Reactions   Metformin And Related Nausea And Vomiting   Ace Inhibitors Swelling        Codeine Nausea And Vomiting and Rash   Hydrocodone  Nausea And Vomiting   Tramadol Nausea And Vomiting   Tylenol [Acetaminophen] Itching and Rash   Current Outpatient Medications on File Prior to Visit  Medication Sig Dispense Refill   acetaminophen (TYLENOL) 325 MG tablet Take 650 mg by mouth every 6 (six) hours as needed for mild pain.     albuterol (VENTOLIN HFA) 108 (90 Base) MCG/ACT inhaler INHALE 2 PUFFS BY MOUTH EVERY 6 HOURS AS NEEDED FOR WHEEZE 25.5 each 2   allopurinol (ZYLOPRIM) 100 MG tablet TAKE 1 TABLET BY MOUTH EVERY DAY 90 tablet 3   AMBULATORY NON FORMULARY MEDICATION Medication Name: Rolling walker 1 Device 0   amLODipine (NORVASC) 10 MG tablet TAKE 1 TABLET BY MOUTH EVERY DAY 90 tablet 3   chlorthalidone (HYGROTON) 25 MG tablet Take 25 mg by mouth daily.     citalopram (CELEXA) 40 MG tablet TAKE 1 TABLET BY MOUTH EVERY  DAY 90 tablet 3   clonazePAM (KLONOPIN) 0.5 MG tablet TAKE 1 TABLET (0.5 MG TOTAL) BY MOUTH 2 (TWO) TIMES DAILY AS NEEDED (VERTIGO). 30 tablet 2   fexofenadine (ALLEGRA) 180 MG tablet TAKE 1 TABLET BY MOUTH EVERY DAY 90 tablet 3   hydrALAZINE (APRESOLINE) 10 MG tablet Take 1 tablet (10 mg total) by mouth 3 (three) times daily. 270 tablet 3   KLOR-CON M20 20 MEQ tablet TAKE 1 TABLET BY MOUTH TWICE A DAY 180 tablet 1   meclizine (ANTIVERT) 25 MG tablet TAKE 1 TABLET BY MOUTH 3 TIMES DAILY AS NEEDED FOR DIZZINESS. 30 tablet 5   metoprolol succinate (TOPROL XL) 25 MG 24 hr tablet Take 1 tablet (25 mg total) by mouth daily. Take this with ( 100 mg) tablet daily. 90 tablet 3   metoprolol succinate (TOPROL-XL) 100 MG 24 hr tablet TAKE 1 TABLET BY MOUTH TWICE A DAY 180 tablet 3   omeprazole (PRILOSEC) 20 MG capsule TAKE 1 CAPSULE BY MOUTH TWICE A DAY 180 capsule 3   oxybutynin (DITROPAN) 5 MG tablet Take 1 tablet (5 mg total) by mouth daily at 10 pm. 90 tablet 3   rosuvastatin (CRESTOR) 40 MG tablet Take 1 tablet (40 mg total) by mouth daily. TAKE 1 TABLET (20 MG TOTAL) BY MOUTH DAILY 90 tablet 3   telmisartan-hydrochlorothiazide (MICARDIS HCT) 80-25 MG tablet Take 1 tablet by mouth daily. 90 tablet 3   traZODone (DESYREL) 100 MG tablet TAKE 1 TABLET BY MOUTH AT BEDTIME AS NEEDED FOR SLEEP 90 tablet 1   triamcinolone (NASACORT) 55 MCG/ACT AERO nasal inhaler PLACE 2 SPRAYS INTO THE NOSE DAILY. 16.9 each 12   No current facility-administered medications on file prior to visit.        ROS:  All others reviewed and negative.  Objective        PE:  BP 140/74 (BP Location: Right Arm, Patient Position: Sitting, Cuff Size: Large)    Pulse 79    Temp 98.7 F (37.1 C) (Oral)    Ht 5\' 3"  (1.6 m)    Wt 191 lb (86.6 kg)    SpO2 96%    BMI 33.83 kg/m                 Constitutional: Pt appears in NAD               HENT: Head: NCAT.                Right Ear: External ear normal.  Left Ear:  External ear normal. Bilat tm's with mild erythema.  Max sinus areas non tender.  Pharynx with mild erythema, no exudate; left TM obsured mostly by large wax impaction, also canal moderately tender, swelling without frank d/c               Eyes: . Pupils are equal, round, and reactive to light. Conjunctivae and EOM are normal               Nose: without d/c or deformity               Neck: Neck supple. Gross normal ROM               Cardiovascular: Normal rate and regular rhythm.                 Pulmonary/Chest: Effort normal and breath sounds without rales or wheezing.                Abd:  Soft, NT, ND, + BS, no organomegaly               Neurological: Pt is alert. At baseline orientation, motor grossly intact               Skin: Skin is warm. No rashes, no other new lesions, LE edema - none               Psychiatric: Pt behavior is normal without agitation   Micro: none  Cardiac tracings I have personally interpreted today:  none  Pertinent Radiological findings (summarize): none   Lab Results  Component Value Date   WBC 7.2 08/23/2021   HGB 11.8 08/23/2021   HCT 36.2 08/23/2021   PLT 294 08/23/2021   GLUCOSE 106 (H) 06/05/2021   CHOL 241 (H) 06/05/2021   TRIG 75.0 06/05/2021   HDL 55.40 06/05/2021   LDLDIRECT 139.1 09/05/2009   LDLCALC 171 (H) 06/05/2021   ALT 8 06/05/2021   AST 15 06/05/2021   NA 138 06/05/2021   K 4.4 06/05/2021   CL 107 06/05/2021   CREATININE 1.57 (H) 06/05/2021   BUN 20 06/05/2021   CO2 24 06/05/2021   TSH 1.980 08/23/2021   INR 1.0 09/24/2018   HGBA1C 6.0 06/05/2021   MICROALBUR 0.8 12/02/2020   Rapid Strep A Screen Negative Negative    SARS Coronavirus 2 Ag Negative Negative    Assessment/Plan:  Jyla Hopf Schuelke is a 71 y.o. Black or African American [2] female with  has a past medical history of Alopecia, Anxiety, Aortic atherosclerosis ([place]), Asthma, Eczema, GERD (gastroesophageal reflux disease), Gout, Heart murmur, History of colon polyps,  History of syncope (2015), Hyperlipidemia, Hypertension, Hypothyroidism, OA (osteoarthritis) of knee, PVC's (premature ventricular contractions), Toxic multinodular goiter, Type 2 diabetes mellitus ([place]), Ventricular tachycardia, polymorphic (01/04/2014), Wears glasses, and Wears partial dentures.  Left ear pain Mod to severe, suspect most likely due to possible complicated left otitis externa, for levaquin course asd, but declines ENT referral for wax removal for now, wants to try antibx first  Allergic rhinitis Over mild worsening recent, for restart nasacort asd,  to f/u any worsening symptoms or concerns  Asthma exacerbation Mild uncontrolled, for add advair 250/50 bid, cont albuterol hfa prn  Followup: Return in about 14 weeks (around 12/05/2021).  [name], MD 08/29/2021 9:34 PM [facility]

## 2021-08-31 ENCOUNTER — Telehealth: Payer: Medicare Other

## 2021-08-31 ENCOUNTER — Encounter: Payer: Self-pay | Admitting: *Deleted

## 2021-08-31 ENCOUNTER — Telehealth: Payer: Self-pay | Admitting: *Deleted

## 2021-08-31 NOTE — Telephone Encounter (Signed)
°  Chronic Care Management   Follow Up Note   08/31/2021 Name: Brooke [name] MRN: [id number] DOB: 1951/02/19  Referred by: Biagio Borg, MD Reason for referral : Chronic Care Management (CCM RN CM Follow Up Outreach; DMII; HTN; Unsuccessful attempt)  An unsuccessful telephone outreach was attempted today. The patient was referred to the case management team for assistance with care management and care coordination.   Follow Up Plan:  A HIPPA compliant phone message was left for the patient providing contact information and requesting a return call Will place request with scheduling care guide to contact patient to re-schedule today's missed CCM RN follow up telephone appointment if I do not hear back from patient by end of day  Oneta Rack, RN, BSN, [place] [phone]: direct office

## 2021-09-05 ENCOUNTER — Ambulatory Visit: Payer: Medicare Other

## 2021-09-12 DIAGNOSIS — E119 Type 2 diabetes mellitus without complications: Secondary | ICD-10-CM | POA: Diagnosis not present

## 2021-09-15 ENCOUNTER — Other Ambulatory Visit: Payer: Self-pay

## 2021-09-15 ENCOUNTER — Ambulatory Visit: Payer: Medicare Other

## 2021-09-15 ENCOUNTER — Ambulatory Visit
Admission: RE | Admit: 2021-09-15 | Discharge: 2021-09-15 | Disposition: A | Discharge status: Home / Self Care | Payer: Medicare Other | Source: Ambulatory Visit | Attending: Internal Medicine | Admitting: Internal Medicine

## 2021-09-15 DIAGNOSIS — I4729 Other ventricular tachycardia: Secondary | ICD-10-CM | POA: Diagnosis not present

## 2021-09-15 DIAGNOSIS — R079 Chest pain, unspecified: Secondary | ICD-10-CM | POA: Diagnosis not present

## 2021-09-15 DIAGNOSIS — Z1231 Encounter for screening mammogram for malignant neoplasm of breast: Secondary | ICD-10-CM | POA: Diagnosis not present

## 2021-09-15 DIAGNOSIS — I10 Essential (primary) hypertension: Secondary | ICD-10-CM | POA: Diagnosis not present

## 2021-09-15 DIAGNOSIS — E782 Mixed hyperlipidemia: Secondary | ICD-10-CM | POA: Diagnosis not present

## 2021-09-18 NOTE — Telephone Encounter (Signed)
Staci rescheduled for  09/19/21  [place] [facility]  Direct Dial: [phone]

## 2021-09-19 ENCOUNTER — Ambulatory Visit (INDEPENDENT_AMBULATORY_CARE_PROVIDER_SITE_OTHER): Payer: Medicare Other | Admitting: *Deleted

## 2021-09-19 DIAGNOSIS — E1165 Type 2 diabetes mellitus with hyperglycemia: Secondary | ICD-10-CM | POA: Diagnosis not present

## 2021-09-19 DIAGNOSIS — I10 Essential (primary) hypertension: Secondary | ICD-10-CM | POA: Diagnosis not present

## 2021-09-19 NOTE — Patient Instructions (Signed)
Visit [place], thank you for taking time to talk with me today. Please don't hesitate to contact me if I can be of assistance to you before our next scheduled telephone appointment  Below are the goals we discussed today:  Patient Self-Care Activities: Patient Brooke [name] will: Take medications as prescribed Attend all scheduled provider appointments Call pharmacy for medication refills Call provider office for new concerns or questions Great job resuming checking your fasting (first thing in the morning, before eating) blood sugars at home, several times each week-- the blood sugar values you reported today are all in good range Continue checking your blood pressures at home and writing them down on paper, please take your blood pressures to the cardiologist so she can review at your next appointment- this is especially important since your blood pressure medications were recently increased in February Continue to follow heart healthy, low salt, low cholesterol, carbohydrate-modified, low sugar diet Continue taking efforts to prevent falls at home- continue using your cane as needed Please follow up with Dr. Jenny Reichmann if your chronic cough does not improve  Our next scheduled telephone follow up visit/ appointment is scheduled on:   Thursday, Dec 07, 2021 at 9:45 am- This is a PHONE [place] appointment  If you need to cancel or re-schedule our visit, please call [phone] and our care guide team will be happy to assist you.   I look forward to hearing about your progress.   Oneta Rack, RN, BSN, [place] [phone]: direct office  If you are experiencing a Mental Health or [place] or need someone to talk to, please  call the Suicide and Crisis Lifeline: 988 call the [place] National Suicide Prevention Lifeline: 418 659 1604 or TTY: 4083805544 TTY 347 414 3646) to talk to a trained counselor call  1-800-273-TALK (toll free, 24 hour hotline) go to [facility] [address], [place] [phone]) call 911   The patient verbalized understanding of instructions, educational materials, and care plan provided today and agreed to receive a mailed copy of patient instructions, educational materials, and care plan  Managing Your Hypertension Hypertension, also called high blood pressure, is when the force of the blood pressing against the walls of the arteries is too strong. Arteries are blood vessels that carry blood from your heart throughout your body. Hypertension forces the heart to work harder to pump blood and may cause the arteries to become narrow or stiff. Understanding blood pressure readings Your personal target blood pressure may vary depending on your medical conditions, your age, and other factors. A blood pressure reading includes a higher number over a lower number. Ideally, your blood pressure should be below 120/80. You should know that: The first, or top, number is called the systolic pressure. It is a measure of the pressure in your arteries as your heart beats. The second, or bottom number, is called the diastolic pressure. It is a measure of the pressure in your arteries as the heart relaxes. Blood pressure is classified into four stages. Based on your blood pressure reading, your health care provider may use the following stages to determine what type of treatment you need, if any. Systolic pressure and diastolic pressure are measured in a unit called mmHg. Normal Systolic pressure: below 967. Diastolic pressure: below 80. Elevated Systolic pressure: 893-810. Diastolic pressure: below 80. Hypertension stage 1 Systolic pressure: 175-102. Diastolic pressure: 58-52. Hypertension stage 2 Systolic pressure: 778 or above. Diastolic  pressure: 90 or above. How can this condition affect me? Managing your hypertension is an important  responsibility. Over time, hypertension can damage the arteries and decrease blood flow to important parts of the body, including the brain, heart, and kidneys. Having untreated or uncontrolled hypertension can lead to: A heart attack. A stroke. A weakened blood vessel (aneurysm). Heart failure. Kidney damage. Eye damage. Metabolic syndrome. Memory and concentration problems. Vascular dementia. What actions can I take to manage this condition? Hypertension can be managed by making lifestyle changes and possibly by taking medicines. Your health care provider will help you make a plan to bring your blood pressure within a normal range. Nutrition  Eat a diet that is high in fiber and potassium, and low in salt (sodium), added sugar, and fat. An example eating plan is called the Dietary Approaches to Stop Hypertension (DASH) diet. To eat this way: Eat plenty of fresh fruits and vegetables. Try to fill one-half of your plate at each meal with fruits and vegetables. Eat whole grains, such as whole-wheat pasta, brown rice, or whole-grain bread. Fill about one-fourth of your plate with whole grains. Eat low-fat dairy products. Avoid fatty cuts of meat, processed or cured meats, and poultry with skin. Fill about one-fourth of your plate with lean proteins such as fish, chicken without skin, beans, eggs, and tofu. Avoid pre-made and processed foods. These tend to be higher in sodium, added sugar, and fat. Reduce your daily sodium intake. Most people with hypertension should eat less than 1,500 mg of sodium a day. Lifestyle  Work with your health care provider to maintain a healthy body weight or to lose weight. Ask what an ideal weight is for you. Get at least 30 minutes of exercise that causes your heart to beat faster (aerobic exercise) most days of the week. Activities may include walking, swimming, or biking. Include exercise to strengthen your muscles (resistance exercise), such as weight  lifting, as part of your weekly exercise routine. Try to do these types of exercises for 30 minutes at least 3 days a week. Do not use any products that contain nicotine or tobacco, such as cigarettes, e-cigarettes, and chewing tobacco. If you need help quitting, ask your health care provider. Control any long-term (chronic) conditions you have, such as high cholesterol or diabetes. Identify your sources of stress and find ways to manage stress. This may include meditation, deep breathing, or making time for fun activities. Alcohol use Do not drink alcohol if: Your health care provider tells you not to drink. You are pregnant, may be pregnant, or are planning to become pregnant. If you drink alcohol: Limit how much you use to: 0-1 drink a day for women. 0-2 drinks a day for men. Be aware of how much alcohol is in your drink. In the [place] one drink equals one 12 oz bottle of beer (355 mL), one 5 oz glass of wine (148 mL), or one 1 oz glass of hard liquor (44 mL). Medicines Your health care provider may prescribe medicine if lifestyle changes are not enough to get your blood pressure under control and if: Your systolic blood pressure is 130 or higher. Your diastolic blood pressure is 80 or higher. Take medicines only as told by your health care provider. Follow the directions carefully. Blood pressure medicines must be taken as told by your health care provider. The medicine does not work as well when you skip doses. Skipping doses also puts you at risk for problems. Monitoring Before you  monitor your blood pressure: Do not smoke, drink caffeinated beverages, or exercise within 30 minutes before taking a measurement. Use the bathroom and empty your bladder (urinate). Sit quietly for at least 5 minutes before taking measurements. Monitor your blood pressure at home as told by your health care provider. To do this: Sit with your back straight and supported. Place your feet flat on the floor. Do  not cross your legs. Support your arm on a flat surface, such as a table. Make sure your upper arm is at heart level. Each time you measure, take two or three readings one minute apart and record the results. You may also need to have your blood pressure checked regularly by your health care provider. General information Talk with your health care provider about your diet, exercise habits, and other lifestyle factors that may be contributing to hypertension. Review all the medicines you take with your health care provider because there may be side effects or interactions. Keep all visits as told by your health care provider. Your health care provider can help you create and adjust your plan for managing your high blood pressure. Where to find more information National Heart, Lung, and Blood Institute: https://wilson-eaton.com/ American Heart Association: www.heart.org Contact a health care provider if: You think you are having a reaction to medicines you have taken. You have repeated (recurrent) headaches. You feel dizzy. You have swelling in your ankles. You have trouble with your vision. Get help right away if: You develop a severe headache or confusion. You have unusual weakness or numbness, or you feel faint. You have severe pain in your chest or abdomen. You vomit repeatedly. You have trouble breathing. These symptoms may represent a serious problem that is an emergency. Do not wait to see if the symptoms will go away. Get medical help right away. Call your local emergency services (911 in the [place] Do not drive yourself to the hospital. Summary Hypertension is when the force of blood pumping through your arteries is too strong. If this condition is not controlled, it may put you at risk for serious complications. Your personal target blood pressure may vary depending on your medical conditions, your age, and other factors. For most people, a normal blood pressure is less than  120/80. Hypertension is managed by lifestyle changes, medicines, or both. Lifestyle changes to help manage hypertension include losing weight, eating a healthy, low-sodium diet, exercising more, stopping smoking, and limiting alcohol. This information is not intended to replace advice given to you by your health care provider. Make sure you discuss any questions you have with your health care provider. Document Revised: 07/27/2019 Document Reviewed: 06/09/2019 Elsevier Patient Education  2022 [employer].

## 2021-09-19 NOTE — Chronic Care Management (AMB) (Signed)
Chronic Care Management   CCM RN Visit Note  09/19/2021 Name: Brooke [name] MRN: [id number] DOB: [birth year]  Subjective: Brooke [name] is a 71 y.o. year old female who is a primary care patient of Biagio Borg, MD. The care management team was consulted for assistance with disease management and care coordination needs.    Engaged with patient by telephone for follow up visit in response to provider referral for case management and/or care coordination services.   Consent to Services:  The patient was given information about Chronic Care Management services, agreed to services, and gave verbal consent prior to initiation of services.  Please see initial visit note for detailed documentation.  Patient agreed to services and verbal consent obtained.   Assessment: Review of patient past medical history, allergies, medications, health status, including review of consultants reports, laboratory and other test data, was performed as part of comprehensive evaluation and provision of chronic care management services.   SDOH (Social Determinants of Health) assessments and interventions performed:  SDOH Interventions    Flowsheet Row Most Recent Value  SDOH Interventions   Food Insecurity Interventions Intervention Not Indicated  [Continues to deny food insecurity]  Transportation Interventions Intervention Not Indicated  [Continues using [employer] regularly,  family assists as indicated/ needed]     CCM Care Plan  Allergies  Allergen Reactions   Metformin And Related Nausea And Vomiting   Ace Inhibitors Swelling        Codeine Nausea And Vomiting and Rash   Hydrocodone Nausea And Vomiting   Tramadol Nausea And Vomiting   Tylenol [Acetaminophen] Itching and Rash   Outpatient Encounter Medications as of 09/19/2021  Medication Sig   acetaminophen (TYLENOL) 325 MG tablet Take 650 mg by mouth every 6 (six) hours as needed for mild pain.   albuterol (VENTOLIN HFA) 108 (90 Base) MCG/ACT  inhaler INHALE 2 PUFFS BY MOUTH EVERY 6 HOURS AS NEEDED FOR WHEEZE   allopurinol (ZYLOPRIM) 100 MG tablet TAKE 1 TABLET BY MOUTH EVERY DAY   AMBULATORY NON FORMULARY MEDICATION Medication Name: Rolling walker   amLODipine (NORVASC) 10 MG tablet TAKE 1 TABLET BY MOUTH EVERY DAY   chlorthalidone (HYGROTON) 25 MG tablet Take 25 mg by mouth daily.   citalopram (CELEXA) 40 MG tablet TAKE 1 TABLET BY MOUTH EVERY DAY   clonazePAM (KLONOPIN) 0.5 MG tablet TAKE 1 TABLET (0.5 MG TOTAL) BY MOUTH 2 (TWO) TIMES DAILY AS NEEDED (VERTIGO).   fexofenadine (ALLEGRA) 180 MG tablet TAKE 1 TABLET BY MOUTH EVERY DAY   fluticasone-salmeterol (ADVAIR DISKUS) 250-50 MCG/ACT AEPB Inhale 1 puff into the lungs in the morning and at bedtime.   hydrALAZINE (APRESOLINE) 10 MG tablet Take 1 tablet (10 mg total) by mouth 3 (three) times daily.   KLOR-CON M20 20 MEQ tablet TAKE 1 TABLET BY MOUTH TWICE A DAY   meclizine (ANTIVERT) 25 MG tablet TAKE 1 TABLET BY MOUTH 3 TIMES DAILY AS NEEDED FOR DIZZINESS.   metoprolol succinate (TOPROL XL) 25 MG 24 hr tablet Take 1 tablet (25 mg total) by mouth daily. Take this with ( 100 mg) tablet daily.   metoprolol succinate (TOPROL-XL) 100 MG 24 hr tablet TAKE 1 TABLET BY MOUTH TWICE A DAY   omeprazole (PRILOSEC) 20 MG capsule TAKE 1 CAPSULE BY MOUTH TWICE A DAY   oxybutynin (DITROPAN) 5 MG tablet Take 1 tablet (5 mg total) by mouth daily at 10 pm.   rosuvastatin (CRESTOR) 40 MG tablet Take 1 tablet (40 mg total) by  mouth daily. TAKE 1 TABLET (20 MG TOTAL) BY MOUTH DAILY   telmisartan-hydrochlorothiazide (MICARDIS HCT) 80-25 MG tablet Take 1 tablet by mouth daily.   traZODone (DESYREL) 100 MG tablet TAKE 1 TABLET BY MOUTH AT BEDTIME AS NEEDED FOR SLEEP   triamcinolone (NASACORT) 55 MCG/ACT AERO nasal inhaler PLACE 2 SPRAYS INTO THE NOSE DAILY.   No facility-administered encounter medications on file as of 09/19/2021.   Patient Active Problem List   Diagnosis Date Noted   Left ear pain  08/29/2021   Grief 06/05/2021   Acute shoulder bursitis, left 06/05/2021   Bilateral shoulder pain 03/05/2020   Vitamin D deficiency 12/01/2019   Allergic rhinitis 06/16/2019   Asthma exacerbation 06/16/2019   Dyspnea 01/27/2019   Primary osteoarthritis of left knee 09/30/2018   CKD (chronic kidney disease) stage 3, GFR 30-59 ml/min (HCC) 07/25/2018   Chronic back pain 07/25/2018   Primary osteoarthritis of right knee 05/05/2018   Osteoarthritis of right knee 04/30/2018   Degenerative arthritis of knee, bilateral 08/07/2017   Depression with anxiety 07/19/2017   Vertigo 01/12/2017   Low back pain 01/12/2017   SOB (shortness of breath) 12/02/2016   Dysuria 09/12/2016   Bilateral knee pain 02/15/2015   Effusion of right knee 01/28/2015   Asthma with exacerbation 11/09/2014   Pronation deformity of both feet 09/29/2014   Greater trochanteric bursitis of left hip 08/31/2014   Degenerative arthritis of left knee 05/18/2014   Ventricular tachycardia, polymorphic 01/04/2014   Abnormal MRI of head 12/04/2013   Syncope 11/06/2013   Chest pain 11/06/2013   Headache 11/06/2013   Nausea with vomiting 11/06/2013   Cough 08/28/2013   Hypokalemia 95/28/4132   Diastolic dysfunction 44/07/270   Leukocytosis 01/03/2011   Toe pain 12/20/2010   Encounter for well adult exam with abnormal findings 12/19/2010   FATIGUE 09/05/2009   Diabetes ([place]) 11/29/2008   Gout 08/09/2008   Normochromic normocytic anemia 02/09/2008   GERD 02/09/2008   ALOPECIA 12/30/2007   Hyperlipidemia 10/01/2007   Essential hypertension 02/26/2007   COLONIC POLYPS, HX OF 02/26/2007   Goiter 09/19/2006   Asthma 09/19/2006   ECZEMA, ATOPIC DERMATITIS 09/19/2006   Insomnia 09/19/2006   Conditions to be addressed/monitored:  HTN and DMII  Care Plan : RN Care Manager Plan of Care  Updates made by Knox Royalty, RN since 09/19/2021 12:00 AM     Problem: Chronic Disease Management Needs   Priority: High      Long-Range Goal: Ongoing adherence to established plan of care for self-health management of DMII/ HTN   Start Date: 02/01/2021  Expected End Date: 02/01/2022  Priority: High  Note:   Current Barriers:  Chronic Disease Management support and education needs related to HTN, HLD, and DMII-- could benefit from ongoing reinforcement of self-health management strategies for DM/ HTN Prior difficulty obtaining or cannot afford medications: CCM Pharmacy team active/ involved 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   Interventions: 1:1 collaboration with primary care provider regarding  development and update of comprehensive plan of care as evidenced by provider attestation and co-signature Inter-disciplinary care team collaboration (see longitudinal plan of care) Evaluation of current treatment plan related to  self management and patient's adherence to plan as established by provider 09/19/21: SDOH updated: no new/ unmet needs identified; confirmed she continues using [employer] transportation, with which she is well established with Pain assessment updated: continues to report chronic lateral neck pain, taking tylenol which "helps a little bit;" confirmed she has discussed neck pain with PCP on ongoing basis; confirms recently reported shoulder pain is essentially resolved after shoulder injection several months ago Falls assessment updated: denies new/ recent fall  since last outreach in December when she reported a fall without injury, from hypoglycemia; confirmed patient continues using cane when she goes outside of home; positive reinforcement provided with encouragement to continue efforts; previously provided education around fall risks/ prevention reinforced Confirmed patient continues independently managing medications; today, she denies concerns around medications and reports she has been taking newly increased doses of HTN medications as advised/ recommended by cardiology provider on 08/23/21; patient declines medication review, and does not have her medications nearby to verify new dosing of HTN medications-- she assures me she is taking all medications as prescribed Reviewed recent urgent/ sick PCP office visit 08/29/21: patient confirms she took antibiotic as prescribed and reports "feeling much better;" she continues to complain of chronic cough and "voice weak, unable to talk for very long;" encouraged patient to follow up with PCP as needed for ongoing management of chronic cough; continues using cough medicine prn Reviewed upcoming scheduled provider office visits: 10/27/21- cardiology provider; 12/05/21- PCP annual exam; confirmed patient aware of all and verbalizes plans to attend as scheduled  Diabetes:  (Status: 09/19/21: Goal on Track (progressing): YES.) Long Term Goal   Lab Results  Component Value Date   HGBA1C 6.0 06/05/2021  Counseled on importance of regular laboratory monitoring as prescribed;        Discussed plans with patient for ongoing care management follow up and provided patient with direct contact information for care management team;      Provided patient with written educational materials related to hypo and hyperglycemia and importance of correct treatment;       Review of patient status, including review of consultants reports, relevant laboratory and other test results, and medications completed;       Confirmed patient has  resumed monitoring and writing down on paper her fasting blood sugars at home; she has not been monitoring since the passing of her husband in November: positive reinforcement provided-- encouraged patient's ongoing monitoring of blood sugars at home Reviewed recent blood sugars at home: she reports general ranges consistently between 116-130, with a morning value today of "126;" she reports one isolated low of "81" without signs/ symptoms hypoglycemia, and one isolated high of "240" for unknown reasons Reinforced previously provided education around signs/ symptoms hypoglycemia along with corresponding action plan: patient will benefit from ongoing reinforcement, support, and education Discussed appetite with patient: reports ongoing/ intermittent decreased appetite after the passing of her husband; denies food insecurity; states "doing best as I can" around diet  Hypertension: (Status: 09/19/21: Goal on Track (progressing): YES.) Long Term Goal Last practice recorded BP readings:  BP Readings from Last 3 Encounters:  08/29/21 140/74  08/23/21 140/60  06/12/21 (!) 158/98  Most recent eGFR/CrCl: No results found for: EGFR  No components found for: CRCL  Evaluation of current treatment plan related to hypertension self  management and patient's adherence to plan as established by provider;   Reviewed prescribed diet heart healthy, low salt, low carbohydrate/ low sugar Discussed plans with patient for ongoing care management follow up and provided patient with direct contact information for care management team; Reviewed recent changes to blood pressure medication post- recent cardiology office visit 08/23/21- reports taking as prescribed; confirms she is taking cholesterol medication as prescribed; she is not near her medications today and is unable to specifically tell me her dosing of HTN medications; she assures me she is taking all as prescribed and denies questions/ concerns Confirmed that patient  received results from recent remote/ home heart monitoring: states that "they called me and said everything was okay" Reviewed recent blood pressures at home: reports lowest value of 108/59; highest of 115/65- encouraged her ongoing monitoring/ recording of blood pressures at home, several times weekly Confirmed patient is not experiencing dizziness/ lightheadedness from recent increased dosing of HTN medications-- advised to notify cardiology provider should these signs/ symptoms develop Encouraged patient to take her recorded blood pressures measurements from home to upcoming scheduled cardiology follow up appointment  Patient Goals/Self-Care Activities: As evidenced by review of EHR, collaboration with care team, and patient reporting during CCM RN CM outreach,  Patient Lian will: Take medications as prescribed Attend all scheduled provider appointments Call pharmacy for medication refills Call provider office for new concerns or questions Great job resuming checking your fasting (first thing in the morning, before eating) blood sugars at home, several times each week-- the blood sugar values you reported today are all in good range Continue checking your blood pressures at home and writing them down on paper, please take your blood pressures to the cardiologist so she can review at your next appointment- this is especially important since your blood pressure medications were recently increased in February Continue to follow heart healthy, low salt, low cholesterol, carbohydrate-modified, low sugar diet Continue taking efforts to prevent falls at home- continue using your cane as needed Please follow up with Dr. Jenny Reichmann if your chronic cough does not improve     Plan: Telephone follow up appointment with care management team member scheduled for: Thursday, Dec 07, 2021 at 9:45 am The patient has been provided with contact information for the care management team and has been advised to call  with any health related questions or concerns  Oneta Rack, RN, BSN, [place] [phone]: direct office

## 2021-09-20 ENCOUNTER — Telehealth: Payer: Self-pay | Admitting: Internal Medicine

## 2021-09-20 NOTE — Telephone Encounter (Signed)
LVM for pt to rtn my call to schedule AWV with [facility]. Please schedule appt if pt calls the office.  

## 2021-09-25 ENCOUNTER — Telehealth: Payer: Self-pay

## 2021-09-25 DIAGNOSIS — R490 Dysphonia: Secondary | ICD-10-CM

## 2021-09-25 NOTE — Telephone Encounter (Signed)
Ok referral done 

## 2021-09-25 NOTE — Telephone Encounter (Signed)
Ok done

## 2021-09-25 NOTE — Telephone Encounter (Signed)
Pt is requesting an ENT referral for her throat. Pt states while talking her voice tends to go in and out. ? ?Pt states her and Dr. Jenny Reichmann discussed this at a previous appt. Pt didn't want to schedule appt at this time.  ? ?Pt CB [phone] ?

## 2021-09-27 NOTE — Telephone Encounter (Signed)
Patient notified of referral to ENT  ?

## 2021-10-13 ENCOUNTER — Ambulatory Visit (INDEPENDENT_AMBULATORY_CARE_PROVIDER_SITE_OTHER): Payer: Medicare Other

## 2021-10-13 ENCOUNTER — Other Ambulatory Visit: Payer: Self-pay

## 2021-10-13 VITALS — BP 130/65 | Wt 191.0 lb

## 2021-10-13 DIAGNOSIS — Z780 Asymptomatic menopausal state: Secondary | ICD-10-CM | POA: Diagnosis not present

## 2021-10-13 DIAGNOSIS — Z0000 Encounter for general adult medical examination without abnormal findings: Secondary | ICD-10-CM | POA: Diagnosis not present
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
## 2021-10-13 NOTE — Patient Instructions (Signed)
Brooke [name] , ?Thank you for taking time to come for your Medicare Wellness Visit. I appreciate your ongoing commitment to your health goals. Please review the following plan we discussed and let me know if I can assist you in the future.  ? ?Screening recommendations/referrals: ?Colonoscopy: Done 01/19/2020 - no repeat required ?Mammogram: Done 2/242023 - Repeat annually ?Bone Density: Done 01/14/2017 - Repeat in 2-5 years  * due this year - referral order sent today ?Recommended yearly ophthalmology/optometry visit for glaucoma screening and checkup ?Recommended yearly dental visit for hygiene and checkup ? ?Vaccinations: ?Influenza vaccine: Done 06/05/2021 - Repeat annually ?Pneumococcal vaccine: Done 01/10/2017 & 01/21/2018 ?Tdap vaccine: Done 01/10/2017 - Repeat in 10 years  ?Shingles vaccine: Zostavax done 2014 - due for Shingrix    ?Covid-19: Done 03/18/2020 & 04/08/2020 - for boosters, contact pharmacy ? ?Advanced directives:  in chart ? ?Conditions/risks identified: Aim for 30 minutes of exercise or walking, 6-8 glasses of water, and 5 servings of fruits and vegetables each day.  ? ?Next appointment: Follow up in one year for your annual wellness visit  ? ? ?Preventive Care 42 Years and Older, Female ?Preventive care refers to lifestyle choices and visits with your health care provider that can promote health and wellness. ?What does preventive care include? ?A yearly physical exam. This is also called an annual well check. ?Dental exams once or twice a year. ?Routine eye exams. Ask your health care provider how often you should have your eyes checked. ?Personal lifestyle choices, including: ?Daily care of your teeth and gums. ?Regular physical activity. ?Eating a healthy diet. ?Avoiding tobacco and drug use. ?Limiting alcohol use. ?Practicing safe sex. ?Taking low-dose aspirin every day. ?Taking vitamin and mineral supplements as recommended by your health care provider. ?What happens during an annual well  check? ?The services and screenings done by your health care provider during your annual well check will depend on your age, overall health, lifestyle risk factors, and family history of disease. ?Counseling  ?Your health care provider may ask you questions about your: ?Alcohol use. ?Tobacco use. ?Drug use. ?Emotional well-being. ?Home and relationship well-being. ?Sexual activity. ?Eating habits. ?History of falls. ?Memory and ability to understand (cognition). ?Work and work Statistician. ?Reproductive health. ?Screening  ?You may have the following tests or measurements: ?Height, weight, and BMI. ?Blood pressure. ?Lipid and cholesterol levels. These may be checked every 5 years, or more frequently if you are over 21 years old. ?Skin check. ?Lung cancer screening. You may have this screening every year starting at age 56 if you have a 30-pack-year history of smoking and currently smoke or have quit within the past 15 years. ?Fecal occult blood test (FOBT) of the stool. You may have this test every year starting at age 51. ?Flexible sigmoidoscopy or colonoscopy. You may have a sigmoidoscopy every 5 years or a colonoscopy every 10 years starting at age 85. ?Hepatitis C blood test. ?Hepatitis B blood test. ?Sexually transmitted disease (STD) testing. ?Diabetes screening. This is done by checking your blood sugar (glucose) after you have not eaten for a while (fasting). You may have this done every 1-3 years. ?Bone density scan. This is done to screen for osteoporosis. You may have this done starting at age 75. ?Mammogram. This may be done every 1-2 years. Talk to your health care provider about how often you should have regular mammograms. ?Talk with your health care provider about your test results, treatment options, and if necessary, the need for more tests. ?Vaccines  ?  Your health care provider may recommend certain vaccines, such as: ?Influenza vaccine. This is recommended every year. ?Tetanus, diphtheria, and  acellular pertussis (Tdap, Td) vaccine. You may need a Td booster every 10 years. ?Zoster vaccine. You may need this after age 35. ?Pneumococcal 13-valent conjugate (PCV13) vaccine. One dose is recommended after age 36. ?Pneumococcal polysaccharide (PPSV23) vaccine. One dose is recommended after age 5. ?Talk to your health care provider about which screenings and vaccines you need and how often you need them. ?This information is not intended to replace advice given to you by your health care provider. Make sure you discuss any questions you have with your health care provider. ?Document Released: 08/05/2015 Document Revised: 03/28/2016 Document Reviewed: 05/10/2015 ?Elsevier Interactive Patient Education ? 2017 [place]. ? ?Fall Prevention in the Home ?Falls can cause injuries. They can happen to people of all ages. There are many things you can do to make your home safe and to help prevent falls. ?What can I do on the outside of my home? ?Regularly fix the edges of walkways and driveways and fix any cracks. ?Remove anything that might make you trip as you walk through a door, such as a raised step or threshold. ?Trim any bushes or trees on the path to your home. ?Use bright outdoor lighting. ?Clear any walking paths of anything that might make someone trip, such as rocks or tools. ?Regularly check to see if handrails are loose or broken. Make sure that both sides of any steps have handrails. ?Any raised decks and porches should have guardrails on the edges. ?Have any leaves, snow, or ice cleared regularly. ?Use sand or salt on walking paths during winter. ?Clean up any spills in your garage right away. This includes oil or grease spills. ?What can I do in the bathroom? ?Use night lights. ?Install grab bars by the toilet and in the tub and shower. Do not use towel bars as grab bars. ?Use non-skid mats or decals in the tub or shower. ?If you need to sit down in the shower, use a plastic, non-slip stool. ?Keep  the floor dry. Clean up any water that spills on the floor as soon as it happens. ?Remove soap buildup in the tub or shower regularly. ?Attach bath mats securely with double-sided non-slip rug tape. ?Do not have throw rugs and other things on the floor that can make you trip. ?What can I do in the bedroom? ?Use night lights. ?Make sure that you have a light by your bed that is easy to reach. ?Do not use any sheets or blankets that are too big for your bed. They should not hang down onto the floor. ?Have a firm chair that has side arms. You can use this for support while you get dressed. ?Do not have throw rugs and other things on the floor that can make you trip. ?What can I do in the kitchen? ?Clean up any spills right away. ?Avoid walking on wet floors. ?Keep items that you use a lot in easy-to-reach places. ?If you need to reach something above you, use a strong step stool that has a grab bar. ?Keep electrical cords out of the way. ?Do not use floor polish or wax that makes floors slippery. If you must use wax, use non-skid floor wax. ?Do not have throw rugs and other things on the floor that can make you trip. ?What can I do with my stairs? ?Do not leave any items on the stairs. ?Make sure that there are  handrails on both sides of the stairs and use them. Fix handrails that are broken or loose. Make sure that handrails are as long as the stairways. ?Check any carpeting to make sure that it is firmly attached to the stairs. Fix any carpet that is loose or worn. ?Avoid having throw rugs at the top or bottom of the stairs. If you do have throw rugs, attach them to the floor with carpet tape. ?Make sure that you have a light switch at the top of the stairs and the bottom of the stairs. If you do not have them, ask someone to add them for you. ?What else can I do to help prevent falls? ?Wear shoes that: ?Do not have high heels. ?Have rubber bottoms. ?Are comfortable and fit you well. ?Are closed at the toe. Do not  wear sandals. ?If you use a stepladder: ?Make sure that it is fully opened. Do not climb a closed stepladder. ?Make sure that both sides of the stepladder are locked into place. ?Ask someone to hold it for you, i

## 2021-10-16 ENCOUNTER — Other Ambulatory Visit: Payer: Self-pay | Admitting: *Deleted

## 2021-10-16 ENCOUNTER — Telehealth: Payer: Self-pay | Admitting: Cardiology

## 2021-10-16 ENCOUNTER — Telehealth: Payer: Self-pay | Admitting: Internal Medicine

## 2021-10-16 MED ORDER — CLONAZEPAM 0.5 MG PO TABS: 0.5000 mg | ORAL_TABLET | Freq: Every day | ORAL | 0 refills | Status: DC | PRN

## 2021-10-16 MED ORDER — ALBUTEROL SULFATE HFA 108 (90 BASE) MCG/ACT IN AERS: INHALATION_SPRAY | RESPIRATORY_TRACT | 2 refills | Status: DC

## 2021-10-16 MED ORDER — MECLIZINE HCL 25 MG PO TABS: ORAL_TABLET | ORAL | 1 refills | Status: DC

## 2021-10-16 MED ORDER — METOPROLOL SUCCINATE ER 25 MG PO TB24: 25.0000 mg | ORAL_TABLET | Freq: Two times a day (BID) | ORAL | 3 refills | Status: DC

## 2021-10-16 NOTE — Telephone Encounter (Signed)
?*  STAT* If patient is at the pharmacy, call can be transferred to refill team. ? ? ?1. Which medications need to be refilled? (please list name of each medication and dose if known)  ?metoprolol succinate (TOPROL XL) 25 MG 24 hr tablet  ?metoprolol succinate (TOPROL-XL) 100 MG 24 hr tablet ? ? ? ?2. Which pharmacy/location (including street and city if local pharmacy) is medication to be sent to? ?[facility] ([facility] ) - [place], [place] ? ?3. Do they need a 30 day or 90 day supply? 90 with refills  ? ? ?[facility] NOT [facility]. All Rx need to go to the mail order  ?

## 2021-10-16 NOTE — Telephone Encounter (Signed)
1.Medication Requested: albuterol (VENTOLIN HFA) 108 (90 Base) MCG/ACT inhaler ? ?clonazePAM (KLONOPIN) 0.5 MG tablet ? ?meclizine (ANTIVERT) 25 MG tablet ? ?2. Pharmacy (Name, [facility], [facility]): [facility] ([facility] ) - [place], [place] ? ?3. On Med List: Y ? ?4. Last Visit with PCP: 08-29-2021 ? ?5. Next visit date with PCP: 12-05-2021 ? ? ?Agent: Please be advised that RX refills may take up to 3 business days. We ask that you follow-up with your pharmacy.  ?

## 2021-10-16 NOTE — Telephone Encounter (Signed)
Pt's medication was resent to pt's pharmacy as requested. Confirmation received.  °
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
## 2021-10-27 ENCOUNTER — Encounter: Payer: Self-pay | Admitting: Cardiology

## 2021-10-27 ENCOUNTER — Ambulatory Visit: Payer: Medicare Other | Admitting: Cardiology

## 2021-10-27 VITALS — BP 140/68 | HR 79 | Ht 63.0 in | Wt 199.6 lb

## 2021-10-27 DIAGNOSIS — R0789 Other chest pain: Secondary | ICD-10-CM | POA: Diagnosis not present

## 2021-10-27 DIAGNOSIS — I4729 Other ventricular tachycardia: Secondary | ICD-10-CM

## 2021-10-27 DIAGNOSIS — I700 Atherosclerosis of aorta: Secondary | ICD-10-CM | POA: Diagnosis not present

## 2021-10-27 DIAGNOSIS — I10 Essential (primary) hypertension: Secondary | ICD-10-CM

## 2021-10-27 DIAGNOSIS — R011 Cardiac murmur, unspecified: Secondary | ICD-10-CM

## 2021-10-27 DIAGNOSIS — E7800 Pure hypercholesterolemia, unspecified: Secondary | ICD-10-CM

## 2021-10-27 LAB — COMPREHENSIVE METABOLIC PANEL
ALT: 13 IU/L (ref 0–32)
AST: 16 IU/L (ref 0–40)
Albumin/Globulin Ratio: 1.7 (ref 1.2–2.2)
Albumin: 4 g/dL (ref 3.8–4.8)
Alkaline Phosphatase: 130 IU/L — ABNORMAL HIGH (ref 44–121)
BUN/Creatinine Ratio: 13 (ref 12–28)
BUN: 21 mg/dL (ref 8–27)
Bilirubin Total: <0.2 mg/dL (ref 0.0–1.2)
CO2: 19 mmol/L — ABNORMAL LOW (ref 20–29)
Calcium: 9.4 mg/dL (ref 8.7–10.3)
Chloride: 108 mmol/L — ABNORMAL HIGH (ref 96–106)
Creatinine, Ser: 1.6 mg/dL — ABNORMAL HIGH (ref 0.57–1.00)
Globulin, Total: 2.4 g/dL (ref 1.5–4.5)
Glucose: 95 mg/dL (ref 70–99)
Potassium: 4.7 mmol/L (ref 3.5–5.2)
Sodium: 146 mmol/L — ABNORMAL HIGH (ref 134–144)
Total Protein: 6.4 g/dL (ref 6.0–8.5)
eGFR: 35 mL/min/{1.73_m2} — ABNORMAL LOW (ref >59)

## 2021-10-27 LAB — LIPID PANEL
Chol/HDL Ratio: 2.2 ratio (ref 0.0–4.4)
Cholesterol, Total: 125 mg/dL (ref 100–199)
HDL: 57 mg/dL (ref >39)
LDL Chol Calc (NIH): 53 mg/dL (ref 0–99)
Triglycerides: 75 mg/dL (ref 0–149)
VLDL Cholesterol Cal: 15 mg/dL (ref 5–40)

## 2021-10-27 NOTE — Patient Instructions (Signed)
Medication Instructions:  ?Your physician recommends that you continue on your current medications as directed. Please refer to the Current Medication list given to you today. ? ?*If you need a refill on your cardiac medications before your next appointment, please call your pharmacy* ? ? ?Lab Work: ?TODAY: CMP, FLP ?If you have labs (blood work) drawn today and your tests are completely normal, you will receive your results only by: ?MyChart Message (if you have MyChart) OR ?A paper copy in the mail ?If you have any lab test that is abnormal or we need to change your treatment, we will call you to review the results. ? ? ?Testing/Procedures: ?Your physician has requested that you have an echocardiogram. Echocardiography is a painless test that uses sound waves to create images of your heart. It provides your doctor with information about the size and shape of your heart and how well your heart?s chambers and valves are working. This procedure takes approximately one hour. There are no restrictions for this procedure. ? ? ? ?Follow-Up: ?At [facility], you and your health needs are our priority.  As part of our continuing mission to provide you with exceptional heart care, we have created designated Provider Care Teams.  These Care Teams include your primary Cardiologist (physician) and Advanced Practice Providers (APPs -  Physician Assistants and Nurse Practitioners) who all work together to provide you with the care you need, when you need it. ? ?We recommend signing up for the patient portal called "MyChart".  Sign up information is provided on this After Visit Summary.  MyChart is used to connect with patients for Virtual Visits (Telemedicine).  Patients are able to view lab/test results, encounter notes, upcoming appointments, etc.  Non-urgent messages can be sent to your provider as well.   ?To learn more about what you can do with MyChart, go to [email or web address].   ? ?Your next appointment:   ?1  year(s) ? ?The format for your next appointment:   ?In Person ? ?Provider:   ?Traci Turner, MD   ? ?

## 2021-10-27 NOTE — Addendum Note (Signed)
Addended by: Precious Gilding on: 10/27/2021 10:13 AM ? ? Modules accepted: Orders ? ?

## 2021-10-30 ENCOUNTER — Telehealth: Payer: Self-pay

## 2021-10-30 DIAGNOSIS — R002 Palpitations: Secondary | ICD-10-CM

## 2021-10-30 DIAGNOSIS — I10 Essential (primary) hypertension: Secondary | ICD-10-CM

## 2021-10-30 MED ORDER — OXYBUTYNIN CHLORIDE 5 MG PO TABS: 5.0000 mg | ORAL_TABLET | Freq: Every day | ORAL | 0 refills | Status: DC

## 2021-10-30 MED ORDER — POTASSIUM CHLORIDE CRYS ER 20 MEQ PO TBCR: 20.0000 meq | EXTENDED_RELEASE_TABLET | Freq: Every day | ORAL | 3 refills | Status: DC

## 2021-10-30 MED ORDER — CHLORTHALIDONE 25 MG PO TABS: 12.5000 mg | ORAL_TABLET | Freq: Every day | ORAL | 3 refills | Status: DC

## 2021-10-30 MED ORDER — TRIAMCINOLONE ACETONIDE 55 MCG/ACT NA AERO: 2.0000 | INHALATION_SPRAY | Freq: Every day | NASAL | 12 refills | Status: DC

## 2021-10-30 NOTE — Telephone Encounter (Signed)
The patient has been notified of the result and verbalized understanding.  All questions (if any) were answered. ?Antonieta Iba, RN 10/30/2021 2:25 PM  ? ? ?

## 2021-10-30 NOTE — Telephone Encounter (Signed)
Prescription sent to pharmacy.

## 2021-10-30 NOTE — Telephone Encounter (Signed)
-----   Message from Sueanne Margarita, MD sent at 10/30/2021 11:33 AM EDT ----- ?Lipids are at goal.  Sodium slightly elevated at 146 and chloride 108.  Decrease chlorthalidone to 12.5 mg daily and Klor-Con to 20 mEq daily and check a bmet in 1 week.  Alkaline phosphatase is elevated which is new so please forward these labs to PCP to follow-up on ?

## 2021-10-30 NOTE — Telephone Encounter (Signed)
Pt is requesting a refill on: ?albuterol (VENTOLIN HFA) 108 (90 Base) MCG/ACT inhaler ?triamcinolone (NASACORT) 55 MCG/ACT AERO nasal inhaler ?oxybutynin (DITROPAN) 5 MG tablet ? ?Pharmacy: ?[facility] ) - [place], [place] ? ?LOV 08/29/21 ?ROV 12/05/21 ?

## 2021-10-31 ENCOUNTER — Other Ambulatory Visit: Payer: Self-pay | Admitting: *Deleted

## 2021-10-31 MED ORDER — METOPROLOL SUCCINATE ER 25 MG PO TB24
25.0000 mg | ORAL_TABLET | Freq: Two times a day (BID) | ORAL | 3 refills | Status: DC
Start: 2021-10-31 — End: 2021-11-06

## 2021-11-06 ENCOUNTER — Other Ambulatory Visit: Payer: Medicare Other

## 2021-11-06 ENCOUNTER — Telehealth: Payer: Self-pay | Admitting: Cardiology

## 2021-11-06 DIAGNOSIS — I10 Essential (primary) hypertension: Secondary | ICD-10-CM | POA: Diagnosis not present

## 2021-11-06 DIAGNOSIS — R002 Palpitations: Secondary | ICD-10-CM | POA: Diagnosis not present

## 2021-11-06 LAB — BASIC METABOLIC PANEL
BUN/Creatinine Ratio: 16 (ref 12–28)
BUN: 25 mg/dL (ref 8–27)
CO2: 20 mmol/L (ref 20–29)
Calcium: 9.3 mg/dL (ref 8.7–10.3)
Chloride: 105 mmol/L (ref 96–106)
Creatinine, Ser: 1.6 mg/dL — ABNORMAL HIGH (ref 0.57–1.00)
Glucose: 94 mg/dL (ref 70–99)
Potassium: 4.1 mmol/L (ref 3.5–5.2)
Sodium: 141 mmol/L (ref 134–144)
eGFR: 35 mL/min/{1.73_m2} — ABNORMAL LOW (ref >59)

## 2021-11-06 MED ORDER — AMLODIPINE BESYLATE 10 MG PO TABS
10.0000 mg | ORAL_TABLET | Freq: Every day | ORAL | 3 refills | Status: DC
Start: 2021-11-06 — End: 2021-12-05

## 2021-11-06 MED ORDER — HYDRALAZINE HCL 10 MG PO TABS: 10.0000 mg | ORAL_TABLET | Freq: Three times a day (TID) | ORAL | 3 refills | Status: DC

## 2021-11-06 MED ORDER — METOPROLOL SUCCINATE ER 25 MG PO TB24: 25.0000 mg | ORAL_TABLET | Freq: Two times a day (BID) | ORAL | 3 refills | Status: DC

## 2021-11-06 MED ORDER — CHLORTHALIDONE 25 MG PO TABS: 12.5000 mg | ORAL_TABLET | Freq: Every day | ORAL | 3 refills | Status: DC

## 2021-11-06 MED ORDER — ROSUVASTATIN CALCIUM 40 MG PO TABS: 40.0000 mg | ORAL_TABLET | Freq: Every day | ORAL | 3 refills | Status: DC

## 2021-11-06 MED ORDER — METOPROLOL SUCCINATE ER 100 MG PO TB24: 100.0000 mg | ORAL_TABLET | Freq: Two times a day (BID) | ORAL | 3 refills | Status: DC

## 2021-11-06 MED ORDER — POTASSIUM CHLORIDE CRYS ER 20 MEQ PO TBCR: 20.0000 meq | EXTENDED_RELEASE_TABLET | Freq: Every day | ORAL | 3 refills | Status: DC

## 2021-11-06 NOTE — Telephone Encounter (Signed)
Pt has been made aware that we sent the following refills to [address], per her request: ? ?Metoprolol 100 mg bid ?Metoprolol 25 mg bid ?Chlorthalidone 25 mg 1/2 daily ?Amlodipine 10 mg daily ?Potassium 20 meq daily ?Rosuvastatin 20 mg daily  ? ?

## 2021-11-06 NOTE — Telephone Encounter (Signed)
Patient brought in a note stating: [facility] because they are not filling her medication and giving her the run around and are cancelling her medication.  ?

## 2021-11-08 ENCOUNTER — Telehealth: Payer: Self-pay

## 2021-11-08 DIAGNOSIS — I10 Essential (primary) hypertension: Secondary | ICD-10-CM

## 2021-11-08 MED ORDER — HYDRALAZINE HCL 25 MG PO TABS: 25.0000 mg | ORAL_TABLET | Freq: Three times a day (TID) | ORAL | 3 refills | Status: DC

## 2021-11-08 NOTE — Telephone Encounter (Signed)
The patient has been notified of the result and verbalized understanding.  All questions (if any) were answered. ?Antonieta Iba, RN 11/08/2021 2:29 PM  ?Repeat labs have been scheduled. Medication changes have been made.  ?

## 2021-11-08 NOTE — Telephone Encounter (Signed)
-----   Message from Sueanne Margarita, MD sent at 11/07/2021  8:13 AM EDT ----- ?Serum creatinine elevated at 1.6.  Please stop chlorthalidone and increase hydralazine to 25 mg 3 times daily.  Please have her check her blood pressure once daily for a week and call with results ?

## 2021-11-09 ENCOUNTER — Telehealth: Payer: Self-pay | Admitting: Internal Medicine

## 2021-11-09 DIAGNOSIS — L989 Disorder of the skin and subcutaneous tissue, unspecified: Secondary | ICD-10-CM

## 2021-11-09 NOTE — Telephone Encounter (Signed)
[employer] calls with an [facility] regarding PT. The representative had performed a PAD and had noted a growth on the plantar surface of their right foot (about 3 centimeters in diameter) . They were not aware if Dr.John had been made aware of this in some while and would like to know if they should proceed with a pediatrist or if Dr.John would like to follow up on this himself with PT! ? ?CB for [facility]: [phone] ? ?CB for PT: [phone] ?

## 2021-11-10 ENCOUNTER — Ambulatory Visit (HOSPITAL_COMMUNITY): Payer: Medicare Other | Attending: Internal Medicine

## 2021-11-10 DIAGNOSIS — R011 Cardiac murmur, unspecified: Secondary | ICD-10-CM | POA: Insufficient documentation

## 2021-11-10 DIAGNOSIS — I10 Essential (primary) hypertension: Secondary | ICD-10-CM | POA: Insufficient documentation

## 2021-11-10 LAB — ECHOCARDIOGRAM COMPLETE
AR max vel: 1.85 cm2
AV Area VTI: 1.66 cm2
AV Area mean vel: 1.6 cm2
AV Mean grad: 9 mmHg
AV Peak grad: 16.6 mmHg
Ao pk vel: 2.04 m/s
Area-P 1/2: 3.93 cm2
S' Lateral: 2.4 cm

## 2021-11-10 NOTE — Telephone Encounter (Signed)
Ok referral is done.

## 2021-11-15 ENCOUNTER — Other Ambulatory Visit: Payer: Medicare Other | Admitting: *Deleted

## 2021-11-15 ENCOUNTER — Telehealth: Payer: Self-pay

## 2021-11-15 DIAGNOSIS — I10 Essential (primary) hypertension: Secondary | ICD-10-CM

## 2021-11-15 NOTE — Progress Notes (Signed)
? ? ?Chronic Care Management ?Pharmacy Assistant  ? ?Name: Brooke [name]  MRN: [id number] DOB: August 14, 1950 ? ?Reason for Encounter: Disease State-General ?  ? ?Recent office visits:  ?None since the last coordination call ? ?Recent consult visits:  ?10/27/21 Fransico Him, MD-Cardiology (Chest pain) Blood work ordered, no med changes ? ?Hospital visits:  ?None since the last coordination call ? ?Medications: ?Outpatient Encounter Medications as of 11/15/2021  ?Medication Sig  ? acetaminophen (TYLENOL) 325 MG tablet Take 650 mg by mouth every 6 (six) hours as needed for mild pain.  ? albuterol (VENTOLIN HFA) 108 (90 Base) MCG/ACT inhaler INHALE 2 PUFFS BY MOUTH EVERY 6 HOURS AS NEEDED FOR WHEEZE  ? allopurinol (ZYLOPRIM) 100 MG tablet TAKE 1 TABLET BY MOUTH EVERY DAY  ? AMBULATORY NON FORMULARY MEDICATION Medication Name: Rolling walker  ? amLODipine (NORVASC) 10 MG tablet Take 1 tablet (10 mg total) by mouth daily.  ? citalopram (CELEXA) 40 MG tablet TAKE 1 TABLET BY MOUTH EVERY DAY  ? clonazePAM (KLONOPIN) 0.5 MG tablet Take 1 tablet (0.5 mg total) by mouth daily as needed (vertigo).  ? fexofenadine (ALLEGRA) 180 MG tablet TAKE 1 TABLET BY MOUTH EVERY DAY  ? fluticasone-salmeterol (ADVAIR DISKUS) 250-50 MCG/ACT AEPB Inhale 1 puff into the lungs in the morning and at bedtime.  ? hydrALAZINE (APRESOLINE) 25 MG tablet Take 1 tablet (25 mg total) by mouth 3 (three) times daily.  ? meclizine (ANTIVERT) 25 MG tablet TAKE 1 TABLET BY MOUTH 3 TIMES DAILY AS NEEDED FOR DIZZINESS.  ? metoprolol succinate (TOPROL XL) 25 MG 24 hr tablet Take 1 tablet (25 mg total) by mouth in the morning and at bedtime. Take this with ( 100 mg) tablet daily.  ? metoprolol succinate (TOPROL-XL) 100 MG 24 hr tablet Take 1 tablet (100 mg total) by mouth 2 (two) times daily. Take with or immediately following a meal.  ? omeprazole (PRILOSEC) 20 MG capsule TAKE 1 CAPSULE BY MOUTH TWICE A DAY  ? oxybutynin (DITROPAN) 5 MG tablet Take 1 tablet (5 mg  total) by mouth daily at 10 pm.  ? rosuvastatin (CRESTOR) 40 MG tablet Take 1 tablet (40 mg total) by mouth daily. TAKE 1 TABLET (20 MG TOTAL) BY MOUTH DAILY  ? telmisartan (MICARDIS) 80 MG tablet Take 80 mg by mouth daily.  ? traZODone (DESYREL) 100 MG tablet TAKE 1 TABLET BY MOUTH AT BEDTIME AS NEEDED FOR SLEEP  ? triamcinolone (NASACORT) 55 MCG/ACT AERO nasal inhaler Place 2 sprays into the nose daily.  ? ?No facility-administered encounter medications on file as of 11/15/2021.  ? ?Have you had any problems recently with your health?Patient states she is not having any new health challenges. She states that she is doing well ? ?Have you had any problems with your pharmacy?Patient states that she was having some issues with [facility] so she switched [facility] to [facility]. She states she does not have any problems with getting medications or the cost ? ?What issues or side effects are you having with your medications?Patient states that she does not have any side effects from medications ? ?What would you like me to pass along to [facility] for them to help you with? Patient states that she is doing well ? ?What can we do to take care of you better? Patient states that she does not need anything at this time ? ?Care Gaps: ?Colonoscopy-NA ?Diabetic Foot Exam-NA ?Mammogram-09/15/21 ?Ophthalmology-NA ?Dexa Scan - NA ?Annual Well Visit - NA ?Micro albumin-12/02/20 ?Hemoglobin A1c-  06/05/21 ? ?Star Rating Drugs: ?Rosuvastatin 40 mg-last fill 08/23/21 90 ds ?Telmisartan 80 mg-last fill 09/13/21 90 ds ? ?Ethelene Hal ?Clinical Pharmacist Assistant ?[phone]  ?

## 2021-11-16 ENCOUNTER — Other Ambulatory Visit: Payer: Self-pay | Admitting: Internal Medicine

## 2021-11-16 LAB — BASIC METABOLIC PANEL
BUN/Creatinine Ratio: 14 (ref 12–28)
BUN: 21 mg/dL (ref 8–27)
CO2: 18 mmol/L — ABNORMAL LOW (ref 20–29)
Calcium: 9.3 mg/dL (ref 8.7–10.3)
Chloride: 110 mmol/L — ABNORMAL HIGH (ref 96–106)
Creatinine, Ser: 1.47 mg/dL — ABNORMAL HIGH (ref 0.57–1.00)
Glucose: 114 mg/dL — ABNORMAL HIGH (ref 70–99)
Potassium: 3.6 mmol/L (ref 3.5–5.2)
Sodium: 144 mmol/L (ref 134–144)
eGFR: 38 mL/min/{1.73_m2} — ABNORMAL LOW (ref >59)

## 2021-11-16 NOTE — Telephone Encounter (Signed)
Please refill as per office routine med refill policy (all routine meds to be refilled for 3 mo or monthly (per pt preference) up to one year from last visit, then month to month grace period for 3 mo, then further med refills will have to be denied) ? ?

## 2021-11-23 ENCOUNTER — Other Ambulatory Visit: Payer: Self-pay | Admitting: Internal Medicine

## 2021-11-23 NOTE — Telephone Encounter (Signed)
Please refill as per office routine med refill policy (all routine meds to be refilled for 3 mo or monthly (per pt preference) up to one year from last visit, then month to month grace period for 3 mo, then further med refills will have to be denied) ? ?

## 2021-11-27 ENCOUNTER — Ambulatory Visit (INDEPENDENT_AMBULATORY_CARE_PROVIDER_SITE_OTHER): Payer: Medicare Other

## 2021-11-27 ENCOUNTER — Ambulatory Visit: Payer: Medicare Other | Admitting: Podiatry

## 2021-11-27 DIAGNOSIS — M79675 Pain in left toe(s): Secondary | ICD-10-CM

## 2021-11-27 DIAGNOSIS — M79674 Pain in right toe(s): Secondary | ICD-10-CM | POA: Diagnosis not present

## 2021-11-27 DIAGNOSIS — B351 Tinea unguium: Secondary | ICD-10-CM

## 2021-11-27 DIAGNOSIS — R2241 Localized swelling, mass and lump, right lower limb: Secondary | ICD-10-CM | POA: Diagnosis not present

## 2021-11-30 ENCOUNTER — Telehealth: Payer: Self-pay | Admitting: *Deleted

## 2021-11-30 NOTE — Telephone Encounter (Signed)
Patient calling for the name of the place where she is supposed to have an MRI,do not see an order for this. Please advise. ?

## 2021-12-03 ENCOUNTER — Encounter: Payer: Self-pay | Admitting: Podiatry

## 2021-12-03 NOTE — Progress Notes (Signed)
?  Subjective:  ?Patient ID: Brooke [name], female    DOB: January 15, 1951,  MRN: [id number] ? ?Chief Complaint  ?Patient presents with  ? Foot Problem  ?  Right toot lesion-*diabetic*- onset about 3 years ago. Painful at times. Gradually has increased in size.   ? ? ?71 y.o. female presents with the above complaint. History confirmed with patient.  Her nails are also thickened elongated and causing pain in shoe gear. ? ?Objective:  ?Physical Exam: ?warm, good capillary refill, no trophic changes or ulcerative lesions, normal DP and PT pulses, normal sensory exam, and on the right foot in the mid arch proximal to the MTPJ there is a large subcutaneous mass that is soft in nature does not feel like a fibroma possible lipoma.  Tender to touch.Marland Kitchen ?Left Foot: dystrophic yellowed discolored nail plates with subungual debris ?Right Foot: dystrophic yellowed discolored nail plates with subungual debris ? ?No images are attached to the encounter. ? ?Radiographs: ?Multiple views x-ray of the right foot:  No osseous abnormalities or calcifications in the area of concern. ?Assessment:  ? ?1. Subcutaneous mass of right foot   ? ? ? ?Plan:  ?Patient was evaluated and treated and all questions answered. ? ?Discussed the etiology and treatment options for the condition in detail with the patient. Educated patient on the topical and oral treatment options for mycotic nails. Recommended debridement of the nails today. Sharp and mechanical debridement performed of all painful and mycotic nails today. Nails debrided in length and thickness using a nail nipper to level of comfort. Discussed treatment options including appropriate shoe gear. Follow up as needed for painful nails. ? ? ?I read my clinical and radiographic findings with her of the subcutaneous mass.  Discussed with her this could be a possible expanding like Poma cyst or soft fibroma.  It did not appear as a classical fibroma.  I recommend an MRI to characterize the lesion and  plan for surgical excision.  I will see her back after the MRI for follow-up. ?Return for after MRI to review.  ? ?

## 2021-12-04 DIAGNOSIS — D140 Benign neoplasm of middle ear, nasal cavity and accessory sinuses: Secondary | ICD-10-CM | POA: Diagnosis not present

## 2021-12-04 DIAGNOSIS — K219 Gastro-esophageal reflux disease without esophagitis: Secondary | ICD-10-CM | POA: Diagnosis not present

## 2021-12-04 DIAGNOSIS — N1832 Chronic kidney disease, stage 3b: Secondary | ICD-10-CM | POA: Diagnosis not present

## 2021-12-04 DIAGNOSIS — J45909 Unspecified asthma, uncomplicated: Secondary | ICD-10-CM | POA: Diagnosis not present

## 2021-12-04 DIAGNOSIS — J3489 Other specified disorders of nose and nasal sinuses: Secondary | ICD-10-CM | POA: Diagnosis not present

## 2021-12-04 DIAGNOSIS — D164 Benign neoplasm of bones of skull and face: Secondary | ICD-10-CM | POA: Diagnosis not present

## 2021-12-04 DIAGNOSIS — J342 Deviated nasal septum: Secondary | ICD-10-CM | POA: Diagnosis not present

## 2021-12-05 ENCOUNTER — Encounter: Payer: Self-pay | Admitting: Internal Medicine

## 2021-12-05 ENCOUNTER — Ambulatory Visit (INDEPENDENT_AMBULATORY_CARE_PROVIDER_SITE_OTHER): Payer: Medicare Other | Admitting: Internal Medicine

## 2021-12-05 ENCOUNTER — Telehealth: Payer: Self-pay

## 2021-12-05 VITALS — BP 148/72 | HR 69 | Temp 98.1°F | Ht 63.0 in | Wt 200.6 lb

## 2021-12-05 DIAGNOSIS — E538 Deficiency of other specified B group vitamins: Secondary | ICD-10-CM

## 2021-12-05 DIAGNOSIS — I10 Essential (primary) hypertension: Secondary | ICD-10-CM | POA: Diagnosis not present

## 2021-12-05 DIAGNOSIS — H9012 Conductive hearing loss, unilateral, left ear, with unrestricted hearing on the contralateral side: Secondary | ICD-10-CM

## 2021-12-05 DIAGNOSIS — E7800 Pure hypercholesterolemia, unspecified: Secondary | ICD-10-CM | POA: Diagnosis not present

## 2021-12-05 DIAGNOSIS — E559 Vitamin D deficiency, unspecified: Secondary | ICD-10-CM

## 2021-12-05 DIAGNOSIS — E1165 Type 2 diabetes mellitus with hyperglycemia: Secondary | ICD-10-CM

## 2021-12-05 DIAGNOSIS — N1831 Chronic kidney disease, stage 3a: Secondary | ICD-10-CM

## 2021-12-05 DIAGNOSIS — Z0001 Encounter for general adult medical examination with abnormal findings: Secondary | ICD-10-CM

## 2021-12-05 DIAGNOSIS — K219 Gastro-esophageal reflux disease without esophagitis: Secondary | ICD-10-CM

## 2021-12-05 DIAGNOSIS — H9192 Unspecified hearing loss, left ear: Secondary | ICD-10-CM | POA: Insufficient documentation

## 2021-12-05 LAB — BASIC METABOLIC PANEL
BUN: 21 mg/dL (ref 6–23)
CO2: 23 mEq/L (ref 19–32)
Calcium: 9.4 mg/dL (ref 8.4–10.5)
Chloride: 110 mEq/L (ref 96–112)
Creatinine, Ser: 1.31 mg/dL — ABNORMAL HIGH (ref 0.40–1.20)
GFR: 41.46 mL/min — ABNORMAL LOW (ref >60.00)
Glucose, Bld: 105 mg/dL — ABNORMAL HIGH (ref 70–99)
Potassium: 3.3 mEq/L — ABNORMAL LOW (ref 3.5–5.1)
Sodium: 143 mEq/L (ref 135–145)

## 2021-12-05 LAB — CBC WITH DIFFERENTIAL/PLATELET
Basophils Absolute: 0 10*3/uL (ref 0.0–0.1)
Basophils Relative: 0.4 % (ref 0.0–3.0)
Eosinophils Absolute: 0.1 10*3/uL (ref 0.0–0.7)
Eosinophils Relative: 0.9 % (ref 0.0–5.0)
HCT: 31.7 % — ABNORMAL LOW (ref 36.0–46.0)
Hemoglobin: 10.1 g/dL — ABNORMAL LOW (ref 12.0–15.0)
Lymphocytes Relative: 17.7 % (ref 12.0–46.0)
Lymphs Abs: 1.4 10*3/uL (ref 0.7–4.0)
MCHC: 32 g/dL (ref 30.0–36.0)
MCV: 81.3 fl (ref 78.0–100.0)
Monocytes Absolute: 0.8 10*3/uL (ref 0.1–1.0)
Monocytes Relative: 10.7 % (ref 3.0–12.0)
Neutro Abs: 5.4 10*3/uL (ref 1.4–7.7)
Neutrophils Relative %: 70.3 % (ref 43.0–77.0)
Platelets: 287 10*3/uL (ref 150.0–400.0)
RBC: 3.9 Mil/uL (ref 3.87–5.11)
RDW: 16.6 % — ABNORMAL HIGH (ref 11.5–15.5)
WBC: 7.7 10*3/uL (ref 4.0–10.5)

## 2021-12-05 LAB — HEPATIC FUNCTION PANEL
ALT: 13 U/L (ref 0–35)
AST: 15 U/L (ref 0–37)
Albumin: 4 g/dL (ref 3.5–5.2)
Alkaline Phosphatase: 105 U/L (ref 39–117)
Bilirubin, Direct: 0.1 mg/dL (ref 0.0–0.3)
Total Bilirubin: 0.3 mg/dL (ref 0.2–1.2)
Total Protein: 7.1 g/dL (ref 6.0–8.3)

## 2021-12-05 LAB — LIPID PANEL
Cholesterol: 106 mg/dL (ref 0–200)
HDL: 49.3 mg/dL (ref >39.00)
LDL Cholesterol: 44 mg/dL (ref 0–99)
NonHDL: 56.24
Total CHOL/HDL Ratio: 2
Triglycerides: 60 mg/dL (ref 0.0–149.0)
VLDL: 12 mg/dL (ref 0.0–40.0)

## 2021-12-05 LAB — HEMOGLOBIN A1C: Hgb A1c MFr Bld: 6 % (ref 4.6–6.5)

## 2021-12-05 LAB — VITAMIN D 25 HYDROXY (VIT D DEFICIENCY, FRACTURES): VITD: 47.31 ng/mL (ref 30.00–100.00)

## 2021-12-05 LAB — VITAMIN B12: Vitamin B-12: 508 pg/mL (ref 211–911)

## 2021-12-05 LAB — TSH: TSH: 2.2 u[IU]/mL (ref 0.35–5.50)

## 2021-12-05 MED ORDER — HYDROCHLOROTHIAZIDE 12.5 MG PO CAPS: 12.5000 mg | ORAL_CAPSULE | Freq: Every day | ORAL | 3 refills | Status: DC

## 2021-12-05 MED ORDER — DAPAGLIFLOZIN PROPANEDIOL 10 MG PO TABS: 10.0000 mg | ORAL_TABLET | Freq: Every day | ORAL | 3 refills | Status: DC

## 2021-12-05 MED ORDER — AMLODIPINE BESYLATE 5 MG PO TABS: 5.0000 mg | ORAL_TABLET | Freq: Every day | ORAL | 3 refills | Status: DC

## 2021-12-05 MED ORDER — AMLODIPINE BESYLATE 5 MG PO TABS
5.0000 mg | ORAL_TABLET | Freq: Every day | ORAL | 3 refills | Status: DC
Start: 2021-12-05 — End: 2022-09-05

## 2021-12-05 MED ORDER — OMEPRAZOLE 40 MG PO CPDR: 40.0000 mg | DELAYED_RELEASE_CAPSULE | Freq: Every day | ORAL | 3 refills | Status: DC

## 2021-12-05 NOTE — Telephone Encounter (Signed)
Prescriptions sent to pharmacy

## 2021-12-05 NOTE — Patient Instructions (Addendum)
Please try to see if the insurance will pay for the Shingles shot at the local pharmacy ? ?Please take all new medication as prescribed - the HCT 12.5 mg per day ? ?Please reduce the amlodipine to 5 mg per day ? ?Please take all new medication as prescribed - the farxiga for sugar, BP and kidneys ? ?Your left ear was irrigated of wax today ? ?Please continue all other medications as before, and refills have been done if requested. ? ?Please have the pharmacy call with any other refills you may need. ? ?Please continue your efforts at being more active, low cholesterol diet, and weight control. ? ?You are otherwise up to date with prevention measures today. ? ?Please keep your appointments with your specialists as you may have planned ? ?Please go to the LAB at the blood drawing area for the tests to be done ? ?You will be contacted by phone if any changes need to be made immediately.  Otherwise, you will receive a letter about your results with an explanation, but please check with MyChart first. ? ?Please remember to sign up for MyChart if you have not done so, as this will be important to you in the future with finding out test results, communicating by private email, and scheduling acute appointments online when needed. ? ?Please make an Appointment to return in 6 months, or sooner if needed ? ? ?

## 2021-12-05 NOTE — Assessment & Plan Note (Signed)
Lab Results  ?Component Value Date  ? HGBA1C 6.0 06/05/2021  ? ?Mild uncontrolled recently, pt to add farxiga 10 mg in light of ckd ? ?

## 2021-12-05 NOTE — Addendum Note (Signed)
Addended by: Terence Lux A on: 12/05/2021 10:28 AM ? ? Modules accepted: Orders ? ?

## 2021-12-05 NOTE — Assessment & Plan Note (Signed)
Age and sex appropriate education and counseling updated with regular exercise and diet ?Referrals for preventative services - none needed ?Immunizations addressed - declines shingrix ?Smoking counseling  - none needed ?Evidence for depression or other mood disorder - stable depression, anxiety ?Most recent labs reviewed. ?I have personally reviewed and have noted: ?1) the patient's medical and social history ?2) The patient's current medications and supplements ?3) The patient's height, weight, and BMI have been recorded in the chart ? ?

## 2021-12-05 NOTE — Telephone Encounter (Signed)
Pt is requesting refill sent to different pharmacy ? ?amLODipine (NORVASC) 5 MG tablet ?dapagliflozin propanediol (FARXIGA) 10 MG TABS tablet ?hydrochlorothiazide (MICROZIDE) 12.5 MG capsule ? ?Pharmacy: ?[id number] [place], [place] - [address]? ? ?

## 2021-12-05 NOTE — Assessment & Plan Note (Signed)
Hearing improved with wax irrigation today ? ?Ceruminosis is noted.  Wax is removed by syringing and manual debridement. Instructions for home care to prevent wax buildup are given. ? ?

## 2021-12-05 NOTE — Progress Notes (Signed)
PRE-PROCEDURE EXAM: Left TM cannot be visualized due to total occlusion/impaction of the ear canal. ? ?PROCEDURE INDICATION: remove wax to visualize ear drum & relieve discomfort ? ?CONSENT:  Verbal ? ?  ? ?PROCEDURE NOTE: ? ?  ? ?LEFT EAR:  I used a plastic wax curette under direct vision with an otoscope to free the wax bolus from the ear wall and then successfully removed a small bit of wax. The ear was then irrigated with warm water with liquid colcace to remove the remaining wax. ? ?POST- PROCEDURE EXAM: TMs successfully visualized and found to have no erythema ? ?  ? ?The patient tolerated the procedure well.  ? ?

## 2021-12-05 NOTE — Progress Notes (Signed)
Patient ID: KLOEE BALLEW, female   DOB: Aug 23, 1950, 71 y.o.   MRN: [id number] ? ? ? ?     Chief Complaint:: wellness exam and Follow-up (Swelling in both legs) ? , dm, htn, left hearing loss ? ?     HPI:  Brooke [name] is a 71 y.o. female here for wellness exam. Due for shingles shot, o/w up to date ?         ?              Also saw ENT yesterday with voice change, and rx omeprazle 40 mg per day for GERD.  Now also with marked increase bilat leg swelling to knees after chlorthalidone stopped per cardiology, pt asks for different fluid pill.  Pt also on higher dose amlodipine, BP at home recently has been < 140/90.  Also left ear hearing reduced in the past wk, without HA, fever, chils, cough, ST and Pt denies chest pain, increased sob or doe, wheezing, orthopnea, PND, increased LE swelling, palpitations, dizziness or syncope.   Pt denies polydipsia, polyuria, or new focal neuro s/s.   CBGs have been very low 100 in the AM, but often low 300's later in the day - pt asks for better daytime control.  Has hx of CKD as well. Denies worsening depressive symptoms, suicidal ideation, or panic ?  ?Wt Readings from Last 3 Encounters:  ?12/05/21 200 lb 9.6 oz (91 kg)  ?10/27/21 199 lb 9.6 oz (90.5 kg)  ?10/13/21 191 lb (86.6 kg)  ? ?BP Readings from Last 3 Encounters:  ?12/05/21 (!) 148/72  ?10/27/21 140/68  ?10/13/21 130/65  ? ?Immunization History  ?Administered Date(s) Administered  ? Fluad Quad(high Dose 65+) 06/21/2020, 06/05/2021  ? Influenza Split 04/18/2011, 04/03/2012  ? Influenza Whole 04/21/2008, 05/03/2009, 04/10/2010  ? Influenza, High Dose Seasonal PF 07/19/2017, 04/08/2018  ? Influenza,inj,Quad PF,6+ Mos 04/02/2013, 04/23/2014, 05/11/2015, 09/12/2016  ? PFIZER(Purple Top)SARS-COV-2 Vaccination 03/18/2020, 04/08/2020  ? Pneumococcal Conjugate-13 01/10/2017  ? Pneumococcal Polysaccharide-23 09/05/2009, 01/21/2018  ? Td 03/23/2004  ? Tdap 01/10/2017  ? Zoster, Live 08/18/2012  ? ?Health Maintenance Due  ?Topic  Date Due  ? HEMOGLOBIN A1C  12/03/2021  ? ?  ? ?Past Medical History:  ?Diagnosis Date  ? Alopecia   ? Anxiety   ? Aortic atherosclerosis ([place])   ? Asthma   ? FOLLOWED BY PCP  ? Eczema   ? GERD (gastroesophageal reflux disease)   ? Gout   ? 04-28-2018--- per pt stable , as been a while since last episode  ? Heart murmur   ? History of colon polyps   ? History of syncope 2015  ? Hyperlipidemia   ? Hypertension   ? Hypothyroidism   ? OA (osteoarthritis) of knee   ? bilateral  ? PVC's (premature ventricular contractions)   ? Toxic multinodular goiter   ? 03/ 2003  s/p RAI  ? Type 2 diabetes mellitus ([place])   ? followed by pcp  ? Ventricular tachycardia, polymorphic ([place]) 01/04/2014  ? primary cardiologist-- dr Tressia Miners turner (hx monitor 2015 showed couplet PVCs, as trigger)  ? Wears glasses   ? Wears partial dentures   ? upper  ? ?Past Surgical History:  ?Procedure Laterality Date  ? ABDOMINAL HYSTERECTOMY  10/22/1999  ? WITH BSO  ? CATARACT EXTRACTION W/ INTRAOCULAR LENS IMPLANT Left YRS AGO  ? COLONOSCOPY    ? EXCISION ABDOMINAL WALL MASS  12-20-2005   dr Margot Chimes '@[facility]   ? neruofibroma  ? LAPAROSCOPIC  CHOLECYSTECTOMY  10/01/2002   dr Margot Chimes '@[facility]   ? LEFT HEART CATHETERIZATION WITH CORONARY ANGIOGRAM N/A 01/07/2014  ? Procedure: LEFT HEART CATHETERIZATION WITH CORONARY ANGIOGRAM;  Surgeon: Peter M Martinique, MD;  Location: [facility] CATH LAB;  Service: Cardiovascular;  Laterality: N/A;  ? [place] PROCEDURE  6/98 neg  ? RENAL ARTERY STENT Left 11/2003  ? angioplasty and stenting  ? RENAL ARTERY STENT Left 02/2005  ? re-stenting  ? TOTAL KNEE ARTHROPLASTY Right 05/05/2018  ? Procedure: RIGHT TOTAL KNEE ARTHROPLASTY;  Surgeon: Frederik Pear, MD;  Location: [facility];  Service: Orthopedics;  Laterality: Right;  ? TOTAL KNEE ARTHROPLASTY Left 09/30/2018  ? Procedure: TOTAL KNEE ARTHROPLASTY;  Surgeon: Melrose Nakayama, MD;  Location: [facility];  Service: Orthopedics;  Laterality: Left;  ? ? reports that she has never smoked. She has never used  smokeless tobacco. She reports that she does not drink alcohol and does not use drugs. ?family history includes Diabetes in her mother; Heart disease in her father. ?Allergies  ?Allergen Reactions  ? Metformin And Related Nausea And Vomiting  ? Ace Inhibitors Swelling  ?   ?  ? Codeine Nausea And Vomiting and Rash  ? Hydrocodone Nausea And Vomiting  ? Tramadol Nausea And Vomiting  ? Tylenol [Acetaminophen] Itching and Rash  ? ?Current Outpatient Medications on File Prior to Visit  ?Medication Sig Dispense Refill  ? acetaminophen (TYLENOL) 325 MG tablet Take 650 mg by mouth every 6 (six) hours as needed for mild pain.    ? albuterol (VENTOLIN HFA) 108 (90 Base) MCG/ACT inhaler INHALE 2 PUFFS BY MOUTH EVERY 6 HOURS AS NEEDED FOR WHEEZING 25.5 each 2  ? allopurinol (ZYLOPRIM) 100 MG tablet TAKE 1 TABLET BY MOUTH EVERY DAY 90 tablet 3  ? AMBULATORY NON FORMULARY MEDICATION Medication Name: Rolling walker 1 Device 0  ? citalopram (CELEXA) 40 MG tablet TAKE 1 TABLET BY MOUTH EVERY DAY 90 tablet 3  ? clonazePAM (KLONOPIN) 0.5 MG tablet Take 1 tablet (0.5 mg total) by mouth daily as needed (vertigo). 90 tablet 0  ? fexofenadine (ALLEGRA) 180 MG tablet TAKE 1 TABLET BY MOUTH EVERY DAY 90 tablet 3  ? fluticasone-salmeterol (ADVAIR DISKUS) 250-50 MCG/ACT AEPB Inhale 1 puff into the lungs in the morning and at bedtime. 60 each 11  ? hydrALAZINE (APRESOLINE) 25 MG tablet Take 1 tablet (25 mg total) by mouth 3 (three) times daily. 270 tablet 3  ? meclizine (ANTIVERT) 25 MG tablet TAKE 1 TABLET BY MOUTH 3 TIMES DAILY AS NEEDED FOR DIZZINESS. 90 tablet 1  ? metoprolol succinate (TOPROL XL) 25 MG 24 hr tablet Take 1 tablet (25 mg total) by mouth in the morning and at bedtime. Take this with ( 100 mg) tablet daily. 180 tablet 3  ? metoprolol succinate (TOPROL-XL) 100 MG 24 hr tablet Take 1 tablet (100 mg total) by mouth 2 (two) times daily. Take with or immediately following a meal. 180 tablet 3  ? oxybutynin (DITROPAN) 5 MG tablet  Take 1 tablet (5 mg total) by mouth daily at 10 pm. 90 tablet 0  ? rosuvastatin (CRESTOR) 40 MG tablet Take 1 tablet (40 mg total) by mouth daily. TAKE 1 TABLET (20 MG TOTAL) BY MOUTH DAILY 90 tablet 3  ? telmisartan (MICARDIS) 80 MG tablet Take 80 mg by mouth daily.    ? traZODone (DESYREL) 100 MG tablet TAKE 1 TABLET BY MOUTH AT BEDTIME AS NEEDED FOR SLEEP 90 tablet 1  ? triamcinolone (NASACORT) 55 MCG/ACT AERO nasal inhaler  PLACE 2 SPRAYS INTO THE NOSE DAILY. 16.9 each 12  ? ?No current facility-administered medications on file prior to visit.  ? ?     ROS:  All others reviewed and negative. ? ?Objective  ? ?     PE:  BP (!) 148/72 (BP Location: Left Arm, Patient Position: Sitting, Cuff Size: Large)   Pulse 69   Temp 98.1 ?F (36.7 ?C) (Oral)   Ht '5\' 3"'$  (1.6 m)   Wt 200 lb 9.6 oz (91 kg)   SpO2 98%   BMI 35.53 kg/m?  ? ?              Constitutional: Pt appears in NAD ?              HENT: Head: NCAT.  ?              Right Ear: External ear normal.   ?              Left Ear: External ear normal.  ?              Eyes: . Pupils are equal, round, and reactive to light. Conjunctivae and EOM are normal ?              Nose: without d/c or deformity ?              Neck: Neck supple. Gross normal ROM ?              Cardiovascular: Normal rate and regular rhythm.   ?              Pulmonary/Chest: Effort normal and breath sounds without rales or wheezing.  ?              Abd:  Soft, NT, ND, + BS, no organomegaly ?              Neurological: Pt is alert. At baseline orientation, motor grossly intact ?              Skin: Skin is warm. No rashes, no other new lesions, LE edema - 1-2+ bilateral legs to knees ?              Psychiatric: Pt behavior is normal without agitation  ? ?Micro: none ? ?Cardiac tracings I have personally interpreted today:  none ? ?Pertinent Radiological findings (summarize): none  ? ?Lab Results  ?Component Value Date  ? WBC 7.2 08/23/2021  ? HGB 11.8 08/23/2021  ? HCT 36.2 08/23/2021  ? PLT 294  08/23/2021  ? GLUCOSE 114 (H) 11/15/2021  ? CHOL 125 10/27/2021  ? TRIG 75 10/27/2021  ? HDL 57 10/27/2021  ? LDLDIRECT 139.1 09/05/2009  ? LDLCALC 53 10/27/2021  ? ALT 13 10/27/2021  ? AST 16 10/27/2021  ? NA 144 04/

## 2021-12-05 NOTE — Assessment & Plan Note (Signed)
Lab Results  ?Component Value Date  ? LDLCALC 53 10/27/2021  ? ?Stable, pt to continue current statin crestor 40 ? ?

## 2021-12-05 NOTE — Assessment & Plan Note (Signed)
Now on higher dose PPI per ENT yesterdya, cont to follow ?

## 2021-12-05 NOTE — Assessment & Plan Note (Signed)
With leg swelling worsening off the chlorthalidone - for add hct 12.5, decrease amlodipine 5 qd, and f/u bp at home and next visit ?

## 2021-12-05 NOTE — Assessment & Plan Note (Signed)
Lab Results  ?Component Value Date  ? CREATININE 1.47 (H) 11/15/2021  ? ?Stable overall, cont to avoid nephrotoxins, f/u renal as planned ?

## 2021-12-05 NOTE — Assessment & Plan Note (Signed)
Last vitamin D ?Lab Results  ?Component Value Date  ? VD25OH 41.78 06/05/2021  ? ?Stable, cont oral replacement ? ?

## 2021-12-06 MED ORDER — EMPAGLIFLOZIN 25 MG PO TABS
25.0000 mg | ORAL_TABLET | Freq: Every day | ORAL | 3 refills | Status: DC
Start: 2021-12-06 — End: 2022-10-08

## 2021-12-06 NOTE — Telephone Encounter (Signed)
Ok to try jardiance instead - I have sent erx ?

## 2021-12-06 NOTE — Addendum Note (Signed)
Addended by: Biagio Borg on: 12/06/2021 01:05 PM ? ? Modules accepted: Orders ? ?

## 2021-12-06 NOTE — Telephone Encounter (Signed)
Pt is requesting a alternative to [place] due to the cost. ? ?Please advise ?

## 2021-12-07 ENCOUNTER — Telehealth: Payer: Medicare Other

## 2021-12-07 ENCOUNTER — Telehealth: Payer: Self-pay | Admitting: *Deleted

## 2021-12-07 ENCOUNTER — Encounter: Payer: Self-pay | Admitting: *Deleted

## 2021-12-07 NOTE — Telephone Encounter (Signed)
  Chronic Care Management   Follow Up Note   12/07/2021 Name: Brooke [name] MRN: [id number] DOB: 1951-06-30  Referred by: Biagio Borg, MD Reason for referral : Chronic Care Management (CCM RN CM Follow Up Telephone Visit; DMII/ HTN; Unsuccessful Outreach attempt)  An unsuccessful telephone outreach was attempted today. The patient was referred to the case management team for assistance with care management and care coordination.   Follow Up Plan:  A HIPPA compliant phone message was left for the patient providing contact information and requesting a return call Will place request with scheduling care guide to contact patient to re-schedule today's missed CCM RN follow up telephone appointment if I do not hear back from patient by end of day  Oneta Rack, RN, BSN, [place] [phone]: direct office

## 2021-12-08 ENCOUNTER — Encounter: Payer: Self-pay | Admitting: Internal Medicine

## 2021-12-08 ENCOUNTER — Other Ambulatory Visit (INDEPENDENT_AMBULATORY_CARE_PROVIDER_SITE_OTHER): Payer: Medicare Other

## 2021-12-08 DIAGNOSIS — E1165 Type 2 diabetes mellitus with hyperglycemia: Secondary | ICD-10-CM | POA: Diagnosis not present

## 2021-12-08 LAB — URINALYSIS, ROUTINE W REFLEX MICROSCOPIC
Bilirubin Urine: NEGATIVE
Ketones, ur: NEGATIVE
Nitrite: NEGATIVE
Specific Gravity, Urine: 1.015 (ref 1.000–1.030)
Urine Glucose: >=1000 — AB
Urobilinogen, UA: 0.2 (ref 0.0–1.0)
pH: 7.5 (ref 5.0–8.0)

## 2021-12-08 LAB — MICROALBUMIN / CREATININE URINE RATIO
Creatinine,U: 43.6 mg/dL
Microalb Creat Ratio: 6.9 mg/g (ref 0.0–30.0)
Microalb, Ur: 3 mg/dL — ABNORMAL HIGH (ref 0.0–1.9)

## 2021-12-08 NOTE — Telephone Encounter (Signed)
Patient notified

## 2021-12-11 DIAGNOSIS — E119 Type 2 diabetes mellitus without complications: Secondary | ICD-10-CM | POA: Diagnosis not present

## 2021-12-15 DIAGNOSIS — N2581 Secondary hyperparathyroidism of renal origin: Secondary | ICD-10-CM | POA: Diagnosis not present

## 2021-12-15 DIAGNOSIS — N281 Cyst of kidney, acquired: Secondary | ICD-10-CM | POA: Diagnosis not present

## 2021-12-15 DIAGNOSIS — I129 Hypertensive chronic kidney disease with stage 1 through stage 4 chronic kidney disease, or unspecified chronic kidney disease: Secondary | ICD-10-CM | POA: Diagnosis not present

## 2021-12-15 DIAGNOSIS — R809 Proteinuria, unspecified: Secondary | ICD-10-CM | POA: Diagnosis not present

## 2021-12-15 DIAGNOSIS — D631 Anemia in chronic kidney disease: Secondary | ICD-10-CM | POA: Diagnosis not present

## 2021-12-15 DIAGNOSIS — N1832 Chronic kidney disease, stage 3b: Secondary | ICD-10-CM | POA: Diagnosis not present

## 2021-12-19 ENCOUNTER — Ambulatory Visit
Admission: RE | Admit: 2021-12-19 | Discharge: 2021-12-19 | Disposition: A | Discharge status: Home / Self Care | Payer: Medicare Other | Source: Ambulatory Visit | Attending: Podiatry | Admitting: Podiatry

## 2021-12-19 DIAGNOSIS — D492 Neoplasm of unspecified behavior of bone, soft tissue, and skin: Secondary | ICD-10-CM | POA: Diagnosis not present

## 2021-12-19 DIAGNOSIS — R2241 Localized swelling, mass and lump, right lower limb: Secondary | ICD-10-CM | POA: Diagnosis not present

## 2021-12-19 DIAGNOSIS — R220 Localized swelling, mass and lump, head: Secondary | ICD-10-CM | POA: Diagnosis not present

## 2021-12-19 DIAGNOSIS — R221 Localized swelling, mass and lump, neck: Secondary | ICD-10-CM | POA: Diagnosis not present

## 2021-12-19 MED ORDER — GADOBENATE DIMEGLUMINE 529 MG/ML IV SOLN
18.0000 mL | Freq: Once | INTRAVENOUS | Status: AC | PRN
  Administered 2021-12-19: 18 mL via INTRAVENOUS

## 2021-12-20 DIAGNOSIS — N281 Cyst of kidney, acquired: Secondary | ICD-10-CM | POA: Diagnosis not present

## 2021-12-26 ENCOUNTER — Other Ambulatory Visit: Payer: Self-pay | Admitting: Urology

## 2021-12-26 DIAGNOSIS — N281 Cyst of kidney, acquired: Secondary | ICD-10-CM

## 2022-01-02 ENCOUNTER — Telehealth: Payer: Self-pay

## 2022-01-02 NOTE — Telephone Encounter (Signed)
Pt is calling to report that the scat forms were filled out correctly and she will be mailing them back for corrections.  Please advise

## 2022-01-05 ENCOUNTER — Telehealth: Payer: Self-pay | Admitting: Internal Medicine

## 2022-01-05 NOTE — Telephone Encounter (Signed)
Pt called in to report bp reading yesterday 6/15 98/52 and today 98/48 pt reports dizziness.

## 2022-01-05 NOTE — Telephone Encounter (Signed)
Yes, this is very concerning that the pressure is so low   Please HOLD the amlodipine and HCT and make ROV next wk next available, and drink more fluids in the meantime  Please consider going to ED for persistent Low BP, fever, dizziness, worsening weakness, or falls, or any other unusual symptoms

## 2022-01-05 NOTE — Telephone Encounter (Signed)
Patient notified

## 2022-01-07 ENCOUNTER — Ambulatory Visit
Admission: RE | Admit: 2022-01-07 | Discharge: 2022-01-07 | Disposition: A | Discharge status: Home / Self Care | Payer: Medicare Other | Source: Ambulatory Visit | Attending: Urology | Admitting: Urology

## 2022-01-07 DIAGNOSIS — K7689 Other specified diseases of liver: Secondary | ICD-10-CM | POA: Diagnosis not present

## 2022-01-07 DIAGNOSIS — K5730 Diverticulosis of large intestine without perforation or abscess without bleeding: Secondary | ICD-10-CM | POA: Diagnosis not present

## 2022-01-07 DIAGNOSIS — N281 Cyst of kidney, acquired: Secondary | ICD-10-CM

## 2022-01-07 MED ORDER — GADOBENATE DIMEGLUMINE 529 MG/ML IV SOLN
19.0000 mL | Freq: Once | INTRAVENOUS | Status: AC | PRN
  Administered 2022-01-07: 19 mL via INTRAVENOUS

## 2022-01-10 ENCOUNTER — Ambulatory Visit (INDEPENDENT_AMBULATORY_CARE_PROVIDER_SITE_OTHER): Payer: Medicare Other | Admitting: Internal Medicine

## 2022-01-10 ENCOUNTER — Encounter: Payer: Self-pay | Admitting: Internal Medicine

## 2022-01-10 VITALS — BP 136/70 | HR 59 | Ht 63.0 in | Wt 186.8 lb

## 2022-01-10 DIAGNOSIS — E7800 Pure hypercholesterolemia, unspecified: Secondary | ICD-10-CM

## 2022-01-10 DIAGNOSIS — N2889 Other specified disorders of kidney and ureter: Secondary | ICD-10-CM | POA: Diagnosis not present

## 2022-01-10 DIAGNOSIS — E1165 Type 2 diabetes mellitus with hyperglycemia: Secondary | ICD-10-CM

## 2022-01-10 DIAGNOSIS — I10 Essential (primary) hypertension: Secondary | ICD-10-CM | POA: Diagnosis not present

## 2022-01-10 DIAGNOSIS — E559 Vitamin D deficiency, unspecified: Secondary | ICD-10-CM

## 2022-01-10 DIAGNOSIS — N1831 Chronic kidney disease, stage 3a: Secondary | ICD-10-CM | POA: Diagnosis not present

## 2022-01-10 DIAGNOSIS — R609 Edema, unspecified: Secondary | ICD-10-CM

## 2022-01-10 NOTE — Patient Instructions (Signed)
Stopping the amlodipine has helped greatly and your swelling is well controlled now  Please continue all other medications as before, and refills have been done if requested.  Please have the pharmacy call with any other refills you may need.  Please continue your efforts at being more active, low cholesterol diet, and weight control.  Please keep your appointments with your specialists as you may have planned - for the kidney mass next Monday  Please make an Appointment to return in 4 months, or sooner if needed

## 2022-01-10 NOTE — Progress Notes (Unsigned)
Patient ID: Brooke [name], female   DOB: [birth year], 71 y.o.   MRN: [id number]        Chief Complaint: follow up HTN, HLD and hyperglycemia ***       HPI:  Brooke [name] is a 71 y.o. female here with c/o        Wt Readings from Last 3 Encounters:  01/10/22 186 lb 12.8 oz (84.7 kg)  12/05/21 200 lb 9.6 oz (91 kg)  10/27/21 199 lb 9.6 oz (90.5 kg)   BP Readings from Last 3 Encounters:  01/10/22 136/70  12/05/21 (!) 148/72  10/27/21 140/68         Past Medical History:  Diagnosis Date   Alopecia    Anxiety    Aortic atherosclerosis (HCC)    Asthma    FOLLOWED BY PCP   Eczema    GERD (gastroesophageal reflux disease)    Gout    04-28-2018--- per pt stable , as been a while since last episode   Heart murmur    History of colon polyps    History of syncope 2015   Hyperlipidemia    Hypertension    Hypothyroidism    OA (osteoarthritis) of knee    bilateral   PVC's (premature ventricular contractions)    Toxic multinodular goiter    03/ 2003  s/p RAI   Type 2 diabetes mellitus ([place])    followed by pcp   Ventricular tachycardia, polymorphic ([place]) 01/04/2014   primary cardiologist-- dr Tressia Miners turner (hx monitor 2015 showed couplet PVCs, as trigger)   Wears glasses    Wears partial dentures    upper   Past Surgical History:  Procedure Laterality Date   ABDOMINAL HYSTERECTOMY  10/22/1999   WITH BSO   CATARACT EXTRACTION W/ INTRAOCULAR LENS IMPLANT Left YRS AGO   COLONOSCOPY     EXCISION ABDOMINAL WALL MASS  12-20-2005   dr Margot Chimes '@[facility]    neruofibroma   LAPAROSCOPIC CHOLECYSTECTOMY  10/01/2002   dr Margot Chimes '@[facility]'$    LEFT HEART CATHETERIZATION WITH CORONARY ANGIOGRAM N/A 01/07/2014   Procedure: LEFT HEART CATHETERIZATION WITH CORONARY ANGIOGRAM;  Surgeon: Peter M Martinique, MD;  Location: [facility] CATH LAB;  Service: Cardiovascular;  Laterality: N/A;   RASTELLI PROCEDURE  6/98 neg   RENAL ARTERY STENT Left 11/2003   angioplasty and stenting   RENAL ARTERY STENT Left 02/2005    re-stenting   TOTAL KNEE ARTHROPLASTY Right 05/05/2018   Procedure: RIGHT TOTAL KNEE ARTHROPLASTY;  Surgeon: Frederik Pear, MD;  Location: [facility];  Service: Orthopedics;  Laterality: Right;   TOTAL KNEE ARTHROPLASTY Left 09/30/2018   Procedure: TOTAL KNEE ARTHROPLASTY;  Surgeon: Melrose Nakayama, MD;  Location: [facility];  Service: Orthopedics;  Laterality: Left;    reports that she has never smoked. She has never used smokeless tobacco. She reports that she does not drink alcohol and does not use drugs. family history includes Diabetes in her mother; Heart disease in her father. Allergies  Allergen Reactions   Metformin And Related Nausea And Vomiting   Ace Inhibitors Swelling        Codeine Nausea And Vomiting and Rash   Hydrocodone Nausea And Vomiting   Tramadol Nausea And Vomiting   Tylenol [Acetaminophen] Itching and Rash   Current Outpatient Medications on File Prior to Visit  Medication Sig Dispense Refill   acetaminophen (TYLENOL) 325 MG tablet Take 650 mg by mouth every 6 (six) hours as needed for mild pain.     albuterol (VENTOLIN HFA) 108 (90  Base) MCG/ACT inhaler INHALE 2 PUFFS BY MOUTH EVERY 6 HOURS AS NEEDED FOR WHEEZING 25.5 each 2   allopurinol (ZYLOPRIM) 100 MG tablet TAKE 1 TABLET BY MOUTH EVERY DAY 90 tablet 3   AMBULATORY NON FORMULARY MEDICATION Medication Name: Rolling walker 1 Device 0   citalopram (CELEXA) 40 MG tablet TAKE 1 TABLET BY MOUTH EVERY DAY 90 tablet 3   clonazePAM (KLONOPIN) 0.5 MG tablet Take 1 tablet (0.5 mg total) by mouth daily as needed (vertigo). 90 tablet 0   empagliflozin (JARDIANCE) 25 MG TABS tablet Take 1 tablet (25 mg total) by mouth daily before breakfast. 90 tablet 3   fexofenadine (ALLEGRA) 180 MG tablet TAKE 1 TABLET BY MOUTH EVERY DAY 90 tablet 3   fluticasone-salmeterol (ADVAIR DISKUS) 250-50 MCG/ACT AEPB Inhale 1 puff into the lungs in the morning and at bedtime. 60 each 11   hydrochlorothiazide (MICROZIDE) 12.5 MG capsule Take 1 capsule  (12.5 mg total) by mouth daily. 90 capsule 3   meclizine (ANTIVERT) 25 MG tablet TAKE 1 TABLET BY MOUTH 3 TIMES DAILY AS NEEDED FOR DIZZINESS. 90 tablet 1   metoprolol succinate (TOPROL XL) 25 MG 24 hr tablet Take 1 tablet (25 mg total) by mouth in the morning and at bedtime. Take this with ( 100 mg) tablet daily. 180 tablet 3   metoprolol succinate (TOPROL-XL) 100 MG 24 hr tablet Take 1 tablet (100 mg total) by mouth 2 (two) times daily. Take with or immediately following a meal. 180 tablet 3   omeprazole (PRILOSEC) 40 MG capsule Take 1 capsule (40 mg total) by mouth daily. 90 capsule 3   oxybutynin (DITROPAN) 5 MG tablet Take 1 tablet (5 mg total) by mouth daily at 10 pm. 90 tablet 0   rosuvastatin (CRESTOR) 40 MG tablet Take 1 tablet (40 mg total) by mouth daily. TAKE 1 TABLET (20 MG TOTAL) BY MOUTH DAILY 90 tablet 3   telmisartan (MICARDIS) 80 MG tablet Take 80 mg by mouth daily.     traZODone (DESYREL) 100 MG tablet TAKE 1 TABLET BY MOUTH AT BEDTIME AS NEEDED FOR SLEEP 90 tablet 1   triamcinolone (NASACORT) 55 MCG/ACT AERO nasal inhaler PLACE 2 SPRAYS INTO THE NOSE DAILY. 16.9 each 12   amLODipine (NORVASC) 5 MG tablet Take 1 tablet (5 mg total) by mouth daily. (Patient not taking: Reported on 01/10/2022) 90 tablet 3   hydrALAZINE (APRESOLINE) 25 MG tablet Take 1 tablet (25 mg total) by mouth 3 (three) times daily. (Patient not taking: Reported on 01/10/2022) 270 tablet 3   No current facility-administered medications on file prior to visit.        ROS:  All others reviewed and negative.  Objective        PE:  BP 136/70 (BP Location: Left Arm, Patient Position: Sitting, Cuff Size: Large)   Pulse (!) 59   Ht '5\' 3"'$  (1.6 m)   Wt 186 lb 12.8 oz (84.7 kg)   SpO2 95%   BMI 33.09 kg/m                 Constitutional: Pt appears in NAD               HENT: Head: NCAT.                Right Ear: External ear normal.                 Left Ear: External ear normal.  Eyes: . Pupils  are equal, round, and reactive to light. Conjunctivae and EOM are normal               Nose: without d/c or deformity               Neck: Neck supple. Gross normal ROM               Cardiovascular: Normal rate and regular rhythm.                 Pulmonary/Chest: Effort normal and breath sounds without rales or wheezing.                Abd:  Soft, NT, ND, + BS, no organomegaly               Neurological: Pt is alert. At baseline orientation, motor grossly intact               Skin: Skin is warm. No rashes, no other new lesions, LE edema - ***               Psychiatric: Pt behavior is normal without agitation   Micro: none  Cardiac tracings I have personally interpreted today:  none  Pertinent Radiological findings (summarize): none   Lab Results  Component Value Date   WBC 7.7 12/05/2021   HGB 10.1 (L) 12/05/2021   HCT 31.7 (L) 12/05/2021   PLT 287.0 12/05/2021   GLUCOSE 105 (H) 12/05/2021   CHOL 106 12/05/2021   TRIG 60.0 12/05/2021   HDL 49.30 12/05/2021   LDLDIRECT 139.1 09/05/2009   LDLCALC 44 12/05/2021   ALT 13 12/05/2021   AST 15 12/05/2021   NA 143 12/05/2021   K 3.3 (L) 12/05/2021   CL 110 12/05/2021   CREATININE 1.31 (H) 12/05/2021   BUN 21 12/05/2021   CO2 23 12/05/2021   TSH 2.20 12/05/2021   INR 1.0 09/24/2018   HGBA1C 6.0 12/05/2021   MICROALBUR 3.0 (H) 12/08/2021   Assessment/Plan:  Helon Wisinski Vantol is a 71 y.o. Black or African American [2] female with  has a past medical history of Alopecia, Anxiety, Aortic atherosclerosis ([place]), Asthma, Eczema, GERD (gastroesophageal reflux disease), Gout, Heart murmur, History of colon polyps, History of syncope (2015), Hyperlipidemia, Hypertension, Hypothyroidism, OA (osteoarthritis) of knee, PVC's (premature ventricular contractions), Toxic multinodular goiter, Type 2 diabetes mellitus ([place]), Ventricular tachycardia, polymorphic ([place]) (01/04/2014), Wears glasses, and Wears partial dentures.  No problem-specific Assessment  & Plan notes found for this encounter.  Followup: No follow-ups on file.  [name], MD 01/10/2022 8:49 AM [facility]

## 2022-01-10 NOTE — Assessment & Plan Note (Signed)
Last vitamin D Lab Results  Component Value Date   VD25OH 47.31 12/05/2021   Stable, cont oral replacement

## 2022-01-12 ENCOUNTER — Ambulatory Visit (INDEPENDENT_AMBULATORY_CARE_PROVIDER_SITE_OTHER): Payer: Medicare Other | Admitting: *Deleted

## 2022-01-12 DIAGNOSIS — E1165 Type 2 diabetes mellitus with hyperglycemia: Secondary | ICD-10-CM

## 2022-01-12 DIAGNOSIS — I10 Essential (primary) hypertension: Secondary | ICD-10-CM

## 2022-01-14 ENCOUNTER — Encounter: Payer: Self-pay | Admitting: Internal Medicine

## 2022-01-14 DIAGNOSIS — N2889 Other specified disorders of kidney and ureter: Secondary | ICD-10-CM | POA: Insufficient documentation

## 2022-01-14 DIAGNOSIS — R609 Edema, unspecified: Secondary | ICD-10-CM | POA: Insufficient documentation

## 2022-01-15 DIAGNOSIS — N281 Cyst of kidney, acquired: Secondary | ICD-10-CM | POA: Diagnosis not present

## 2022-01-16 ENCOUNTER — Ambulatory Visit: Payer: Medicare Other | Admitting: Podiatry

## 2022-01-16 DIAGNOSIS — R2241 Localized swelling, mass and lump, right lower limb: Secondary | ICD-10-CM | POA: Diagnosis not present

## 2022-01-19 DIAGNOSIS — E1159 Type 2 diabetes mellitus with other circulatory complications: Secondary | ICD-10-CM

## 2022-01-19 DIAGNOSIS — I10 Essential (primary) hypertension: Secondary | ICD-10-CM | POA: Diagnosis not present

## 2022-01-20 ENCOUNTER — Encounter: Payer: Self-pay | Admitting: Podiatry

## 2022-01-20 NOTE — Progress Notes (Signed)
  Subjective:  Patient ID: Brooke [name], female    DOB: November 08, 1950,  MRN: [id number]  Chief Complaint  Patient presents with   Mass    Right foot,   MRI results    71 y.o. female presents with the above complaint. History confirmed with patient.  Her nails are also thickened elongated and causing pain in shoe gear.  Objective:  Physical Exam: warm, good capillary refill, no trophic changes or ulcerative lesions, normal DP and PT pulses, normal sensory exam, and on the right foot in the mid arch proximal to the MTPJ there is a large subcutaneous mass that is soft in nature does not feel like a fibroma possible lipoma.  Tender to touch.. Left Foot: dystrophic yellowed discolored nail plates with subungual debris Right Foot: dystrophic yellowed discolored nail plates with subungual debris  IMPRESSION: 1. Well-circumscribed flattened mass within the cutaneous to subcutaneous soft tissues at the plantar aspect of the right forefoot underlying the level of the first metatarsal head/neck measuring 2.7 x 0.5 x 2.6 cm. Mass is avidly enhances on postcontrast sequences. Findings are nonspecific and could possibly reflect a soft tissue hemangioma. Soft tissue neoplasm such as sarcoma or cutaneous lymphoma not excluded. Tissue sampling recommended for definitive diagnosis. 2. No acute osseous abnormality.  Radiographs: Multiple views x-ray of the right foot:  No osseous abnormalities or calcifications in the area of concern. Assessment:   1. Subcutaneous mass of right foot       Plan:  Patient was evaluated and treated and all questions answered.  Reviewed the results of the MRI and discuss further treatment options including offloading monitoring and surgical excision.  She would like to have the mass removed and biopsy.  I concur with this to obtain an exact diagnosis with histopathology.  Given its chronicity I do not suspect this is any sort of malignancy however cannot be excluded  without biopsy.  We discussed the risk benefits and potential complications of excision including but not limited to  pain, swelling, infection, scar, numbness which may be temporary or permanent, chronic pain, stiffness, nerve pain or damage, wound healing problems.  She understands and wishes to proceed.  All questions were addressed.  No guarantees as to the outcome of the surgery were able to be made.  Informed sent was signed and reviewed.    Surgical plan:  Procedure: -Excision of soft tissue mass of right foot  Location: -[facility]  Anesthesia plan: -IV sedation with local field block  Postoperative pain plan: - Tylenol 1000 mg every 6 hours, ibuprofen 600 mg every 6 hours, gabapentin 300 mg every 8 hours x5 days, oxycodone 5 mg 1-2 tabs every 6 hours only as needed  DVT prophylaxis: -None required  WB Restrictions / DME needs: -NWB in CAM boot postop if plantar incision is required   No follow-ups on file.

## 2022-02-04 ENCOUNTER — Other Ambulatory Visit: Payer: Self-pay | Admitting: Internal Medicine

## 2022-02-04 NOTE — Telephone Encounter (Signed)
Please refill as per office routine med refill policy (all routine meds to be refilled for 3 mo or monthly (per pt preference) up to one year from last visit, then month to month grace period for 3 mo, then further med refills will have to be denied) ? ?

## 2022-02-11 ENCOUNTER — Other Ambulatory Visit: Payer: Self-pay | Admitting: Internal Medicine

## 2022-03-06 ENCOUNTER — Ambulatory Visit: Payer: Self-pay | Admitting: Licensed Clinical Social Worker

## 2022-03-06 ENCOUNTER — Telehealth: Payer: Self-pay | Admitting: Urology

## 2022-03-06 NOTE — Patient Instructions (Addendum)
Visit Information  Thank you for taking time to visit with me today. Please don't hesitate to contact me if I can be of assistance to you before our next scheduled telephone appointment.  Following are the goals we discussed today:   No further intervention is needed at present  Please call the care guide team at [phone] if you need to cancel or reschedule your appointment.   If you are experiencing a Mental Health or [place] or need someone to talk to, please go to [facility] [phone])   Following is a copy of your full plan of care:   Care Coordination Interventions:  Active listening / Reflection utilized  Described Care Coordination program to client Discussed current needs of client Discussed medication procurement of client Discussed transport needs of client   [name] was given information about Care Management services by the embedded care coordination team including:  Care Management services include personalized support from designated clinical staff supervised by her physician, including individualized plan of care and coordination with other care providers 24/7 contact phone numbers for assistance for urgent and routine care needs. The patient may stop CCM services at any time (effective at the end of the month) by phone call to the office staff.  Patient agreed to services and verbal consent obtained.   Brooke [name].Glendell Schlottman MSW, [place] [facility] representative [facility] Care Management [phone]

## 2022-03-06 NOTE — Telephone Encounter (Signed)
DOS - 04/06/22  EXC BENIGN LESION RIGHT --- 11424  [employer] EFFECTIVE DATE - 07/23/21  PLAN DEDUCTIBLE - $0.00 OUT OF POCKET - $4,500.00 W/ $3,606.77 REMAINING COINSURANCE - 0% COPAY - $325.00  PER [employer] WEBSITE FOR CPT CODE 03403 Notification or Prior Authorization is not required for the requested services  Decision ID #:[id number]

## 2022-03-06 NOTE — Patient Outreach (Signed)
  Care Coordination   Initial Visit Note   03/06/2022 Name: Brooke [name] MRN: [id number] DOB: November 16, 1950  Brooke [name] Pro is a 71 y.o. year old female who sees Brooke Borg, MD for primary care. I spoke with  Brooke [name] G[place] by phone today  What matters to the patients health and wellness today?  Client is stable . She did not feel that she needed Care coordination program support at this time    Goals Addressed               This Visit's Progress     Patient said she is stable at present  She does not feel that she needs Care Coordination support at present. (pt-stated)        Care Coordination Interventions:  Active listening / Reflection utilized  Described Care Coordination program to client Discussed current needs of client Discussed medication procurement of client Discussed transport needs of client      SDOH assessments and interventions completed:  No    Care Coordination Interventions Activated:  No  Care Coordination Interventions:  No, not indicated   Follow up plan: No further intervention required.   Encounter Outcome:  Pt. Visit Completed

## 2022-03-08 DIAGNOSIS — E119 Type 2 diabetes mellitus without complications: Secondary | ICD-10-CM | POA: Diagnosis not present

## 2022-03-27 ENCOUNTER — Telehealth: Payer: Self-pay

## 2022-03-27 NOTE — Telephone Encounter (Signed)
Brooke [name] called to cancel her surgery with Dr. Sherryle Lis on 04/06/2022. She stated [place] requested her to pay $325.00 the day of surgery but she is not able to do that at this time. She will call me back once she has the money.

## 2022-04-12 ENCOUNTER — Encounter: Payer: Medicare Other | Admitting: Podiatry

## 2022-04-16 ENCOUNTER — Encounter: Payer: Medicare Other | Admitting: Podiatry

## 2022-04-26 ENCOUNTER — Emergency Department (HOSPITAL_COMMUNITY)
Admission: EM | Admit: 2022-04-26 | Discharge: 2022-04-26 | Disposition: A | Discharge status: Home / Self Care | Payer: Medicare Other | Attending: Emergency Medicine | Admitting: Emergency Medicine

## 2022-04-26 ENCOUNTER — Emergency Department (HOSPITAL_COMMUNITY): Payer: Medicare Other

## 2022-04-26 ENCOUNTER — Encounter: Payer: Medicare Other | Admitting: Podiatry

## 2022-04-26 ENCOUNTER — Encounter (HOSPITAL_COMMUNITY): Payer: Self-pay

## 2022-04-26 ENCOUNTER — Other Ambulatory Visit: Payer: Self-pay

## 2022-04-26 DIAGNOSIS — M19039 Primary osteoarthritis, unspecified wrist: Secondary | ICD-10-CM

## 2022-04-26 DIAGNOSIS — M1120 Other chondrocalcinosis, unspecified site: Secondary | ICD-10-CM

## 2022-04-26 DIAGNOSIS — E876 Hypokalemia: Secondary | ICD-10-CM | POA: Insufficient documentation

## 2022-04-26 DIAGNOSIS — M11232 Other chondrocalcinosis, left wrist: Secondary | ICD-10-CM | POA: Insufficient documentation

## 2022-04-26 DIAGNOSIS — R6889 Other general symptoms and signs: Secondary | ICD-10-CM | POA: Diagnosis not present

## 2022-04-26 DIAGNOSIS — M13832 Other specified arthritis, left wrist: Secondary | ICD-10-CM | POA: Diagnosis not present

## 2022-04-26 DIAGNOSIS — M79603 Pain in arm, unspecified: Secondary | ICD-10-CM | POA: Diagnosis not present

## 2022-04-26 DIAGNOSIS — M25532 Pain in left wrist: Secondary | ICD-10-CM | POA: Diagnosis present

## 2022-04-26 DIAGNOSIS — Z743 Need for continuous supervision: Secondary | ICD-10-CM | POA: Diagnosis not present

## 2022-04-26 DIAGNOSIS — M19032 Primary osteoarthritis, left wrist: Secondary | ICD-10-CM | POA: Diagnosis not present

## 2022-04-26 DIAGNOSIS — M79602 Pain in left arm: Secondary | ICD-10-CM | POA: Diagnosis not present

## 2022-04-26 LAB — CBC
HCT: 33.9 % — ABNORMAL LOW (ref 36.0–46.0)
Hemoglobin: 10.6 g/dL — ABNORMAL LOW (ref 12.0–15.0)
MCH: 26 pg (ref 26.0–34.0)
MCHC: 31.3 g/dL (ref 30.0–36.0)
MCV: 83.1 fL (ref 80.0–100.0)
Platelets: 282 10*3/uL (ref 150–400)
RBC: 4.08 MIL/uL (ref 3.87–5.11)
RDW: 17.2 % — ABNORMAL HIGH (ref 11.5–15.5)
WBC: 6.3 10*3/uL (ref 4.0–10.5)
nRBC: 0 % (ref 0.0–0.2)

## 2022-04-26 LAB — BASIC METABOLIC PANEL
Anion gap: 6 (ref 5–15)
BUN: 19 mg/dL (ref 8–23)
CO2: 26 mmol/L (ref 22–32)
Calcium: 8.9 mg/dL (ref 8.9–10.3)
Chloride: 108 mmol/L (ref 98–111)
Creatinine, Ser: 1.5 mg/dL — ABNORMAL HIGH (ref 0.44–1.00)
GFR, Estimated: 37 mL/min — ABNORMAL LOW (ref >60)
Glucose, Bld: 107 mg/dL — ABNORMAL HIGH (ref 70–99)
Potassium: 2.8 mmol/L — ABNORMAL LOW (ref 3.5–5.1)
Sodium: 140 mmol/L (ref 135–145)

## 2022-04-26 LAB — C-REACTIVE PROTEIN: CRP: 6 mg/dL — ABNORMAL HIGH (ref <1.0)

## 2022-04-26 LAB — SEDIMENTATION RATE: Sed Rate: 58 mm/hr — ABNORMAL HIGH (ref 0–22)

## 2022-04-26 MED ORDER — POTASSIUM CHLORIDE CRYS ER 20 MEQ PO TBCR
40.0000 meq | EXTENDED_RELEASE_TABLET | Freq: Once | ORAL | Status: AC
  Administered 2022-04-26: 40 meq via ORAL
  Filled 2022-04-26: qty 2

## 2022-04-26 MED ORDER — POTASSIUM CHLORIDE CRYS ER 20 MEQ PO TBCR: 20.0000 meq | EXTENDED_RELEASE_TABLET | Freq: Two times a day (BID) | ORAL | 0 refills | Status: DC

## 2022-04-26 MED ORDER — OXYCODONE-ACETAMINOPHEN 5-325 MG PO TABS: 1.0000 | ORAL_TABLET | Freq: Four times a day (QID) | ORAL | 0 refills | Status: DC | PRN

## 2022-04-26 MED ORDER — OXYCODONE-ACETAMINOPHEN 5-325 MG PO TABS
1.0000 | ORAL_TABLET | Freq: Once | ORAL | Status: AC
  Administered 2022-04-26: 1 via ORAL
  Filled 2022-04-26: qty 1

## 2022-04-26 MED ORDER — PREDNISONE 10 MG (21) PO TBPK: ORAL_TABLET | ORAL | 0 refills | Status: DC

## 2022-04-26 MED ORDER — PREDNISONE 50 MG PO TABS
50.0000 mg | ORAL_TABLET | Freq: Once | ORAL | Status: AC
  Administered 2022-04-26: 50 mg via ORAL
  Filled 2022-04-26: qty 1

## 2022-04-26 NOTE — ED Provider Notes (Signed)
[facility] DEPT Provider Note   CSN: [id number] Arrival date & time: 04/26/22  1006     History  Chief Complaint  Patient presents with   Extremity Pain    Brooke [name] is a 71 y.o. female.   Extremity Pain     Patient presents to the ED for evaluation of wrist pain.  Patient states she started having symptoms a few days ago.  She does not recall any specific fall or injury.  Patient does have a history of gout but has never had trouble in her wrist.  Patient denies any fevers or chills.  The pain however is increasing in severity.  Hurts to move her wrist.  She has noticed some swelling and redness that has developed in her left wrist  Home Medications Prior to Admission medications   Medication Sig Start Date End Date Taking? Authorizing Provider  oxyCODONE-acetaminophen (PERCOCET/ROXICET) 5-325 MG tablet Take 1 tablet by mouth every 6 (six) hours as needed for severe pain. 04/26/22  Yes Dorie Rank, MD  potassium chloride SA (KLOR-CON M) 20 MEQ tablet Take 1 tablet (20 mEq total) by mouth 2 (two) times daily for 5 days. 04/26/22 05/01/22 Yes Dorie Rank, MD  predniSONE (STERAPRED UNI-PAK 21 TAB) 10 MG (21) TBPK tablet Take 6 tabs by mouth daily  for 2 days, then 5 tabs for 2 days, then 4 tabs for 2 days, then 3 tabs for 2 days, 2 tabs for 2 days, then 1 tab by mouth daily for 2 days 04/26/22  Yes Dorie Rank, MD  acetaminophen (TYLENOL) 325 MG tablet Take 650 mg by mouth every 6 (six) hours as needed for mild pain.    Biagio Borg, MD  albuterol (VENTOLIN HFA) 108 (90 Base) MCG/ACT inhaler INHALE 2 PUFFS BY MOUTH EVERY 6 HOURS AS NEEDED FOR WHEEZING 11/16/21   Biagio Borg, MD  allopurinol (ZYLOPRIM) 100 MG tablet TAKE 1 TABLET BY MOUTH EVERY DAY 07/28/21   Biagio Borg, MD  AMBULATORY NON FORMULARY MEDICATION Medication Name: Rolling walker 03/21/18   Lyndal Pulley, DO  amLODipine (NORVASC) 5 MG tablet Take 1 tablet (5 mg total) by mouth  daily. Patient not taking: Reported on 01/10/2022 12/05/21 12/05/22  Biagio Borg, MD  citalopram (CELEXA) 40 MG tablet TAKE 1 TABLET BY MOUTH EVERY DAY 06/19/21   Biagio Borg, MD  clonazePAM (KLONOPIN) 0.5 MG tablet Take 1 tablet (0.5 mg total) by mouth daily as needed (vertigo). 10/16/21   Biagio Borg, MD  empagliflozin (JARDIANCE) 25 MG TABS tablet Take 1 tablet (25 mg total) by mouth daily before breakfast. 12/06/21   Biagio Borg, MD  fexofenadine (ALLEGRA) 180 MG tablet TAKE 1 TABLET BY MOUTH EVERY DAY 07/28/21   Biagio Borg, MD  fluticasone-salmeterol (ADVAIR DISKUS) 250-50 MCG/ACT AEPB Inhale 1 puff into the lungs in the morning and at bedtime. 08/29/21   Biagio Borg, MD  hydrALAZINE (APRESOLINE) 25 MG tablet Take 1 tablet (25 mg total) by mouth 3 (three) times daily. Patient not taking: Reported on 01/10/2022 11/08/21   Sueanne Margarita, MD  hydrochlorothiazide (MICROZIDE) 12.5 MG capsule Take 1 capsule (12.5 mg total) by mouth daily. Patient not taking: Reported on 01/12/2022 12/05/21   Biagio Borg, MD  meclizine (ANTIVERT) 25 MG tablet TAKE 1 TABLET BY MOUTH 3 TIMES DAILY AS NEEDED FOR DIZZINESS. 10/16/21   Biagio Borg, MD  metoprolol succinate (TOPROL XL) 25 MG 24 hr tablet Take  1 tablet (25 mg total) by mouth in the morning and at bedtime. Take this with ( 100 mg) tablet daily. 11/06/21 11/06/22  Sueanne Margarita, MD  metoprolol succinate (TOPROL-XL) 100 MG 24 hr tablet Take 1 tablet (100 mg total) by mouth 2 (two) times daily. Take with or immediately following a meal. 11/06/21   Turner, Eber Hong, MD  omeprazole (PRILOSEC) 40 MG capsule Take 1 capsule (40 mg total) by mouth daily. 12/05/21   Biagio Borg, MD  oxybutynin (DITROPAN) 5 MG tablet TAKE 1 TABLET (5 MG TOTAL) BY MOUTH DAILY AT 10 PM. 02/05/22   Biagio Borg, MD  rosuvastatin (CRESTOR) 40 MG tablet Take 1 tablet (40 mg total) by mouth daily. TAKE 1 TABLET (20 MG TOTAL) BY MOUTH DAILY 11/06/21 11/06/22  Sueanne Margarita, MD  telmisartan  (MICARDIS) 80 MG tablet Take 80 mg by mouth daily. 09/13/21   [provider]  traZODone (DESYREL) 100 MG tablet TAKE 1 TABLET BY MOUTH EVERY DAY AT BEDTIME AS NEEDED FOR SLEEP 02/11/22   Biagio Borg, MD  triamcinolone (NASACORT) 55 MCG/ACT AERO nasal inhaler PLACE 2 SPRAYS INTO THE NOSE DAILY. 11/16/21   Biagio Borg, MD      Allergies    Metformin and related, Ace inhibitors, Codeine, Hydrocodone, Tramadol, and Tylenol [acetaminophen]    Review of Systems   Review of Systems  Physical Exam Updated Vital Signs BP 134/70 (BP Location: Left Arm)   Pulse 64   Temp 97.6 F (36.4 C) (Oral)   Resp 18   SpO2 99%  Physical Exam Vitals and nursing note reviewed.  Constitutional:      General: She is not in acute distress.    Appearance: She is well-developed.  HENT:     Head: Normocephalic and atraumatic.     Right Ear: External ear normal.     Left Ear: External ear normal.  Eyes:     General: No scleral icterus.       Right eye: No discharge.        Left eye: No discharge.     Conjunctiva/sclera: Conjunctivae normal.  Neck:     Trachea: No tracheal deviation.  Cardiovascular:     Rate and Rhythm: Normal rate.  Pulmonary:     Effort: Pulmonary effort is normal. No respiratory distress.     Breath sounds: No stridor.  Abdominal:     General: There is no distension.  Musculoskeletal:        General: Swelling and tenderness present. No deformity.     Left wrist: Tenderness and bony tenderness present. Decreased range of motion.     Cervical back: Neck supple.     Comments: Erythema noted around the ulnar styloid, limited range of motion due to pain no swelling forearm, distal neurovascular intact  Skin:    General: Skin is warm and dry.     Findings: No rash.  Neurological:     Mental Status: She is alert.     Cranial Nerves: Cranial nerve deficit: no gross deficits.     ED Results / Procedures / Treatments   Labs (all labs ordered are listed, but only  abnormal results are displayed) Labs Reviewed  CBC - Abnormal; Notable for the following components:      Result Value   Hemoglobin 10.6 (*)    HCT 33.9 (*)    RDW 17.2 (*)    All other components within normal limits  BASIC METABOLIC PANEL - Abnormal; Notable  for the following components:   Potassium 2.8 (*)    Glucose, Bld 107 (*)    Creatinine, Ser 1.50 (*)    GFR, Estimated 37 (*)    All other components within normal limits  SEDIMENTATION RATE - Abnormal; Notable for the following components:   Sed Rate 58 (*)    All other components within normal limits  C-REACTIVE PROTEIN    EKG None  Radiology DG Wrist Complete Left  Result Date: 04/26/2022 CLINICAL DATA:  Left arm pain and swelling EXAM: LEFT WRIST - COMPLETE 3 VIEW COMPARISON:  None Available. FINDINGS: No evidence of fracture or dislocation. Mild degenerative changes of the first [facility] joint and distal radioulnar joint. Diffuse demineralization. Chondrocalcinosis of the ulnocarpal joint. Mild soft tissue swelling of the wrist. IMPRESSION: 1. No acute osseous abnormality. 2. Chondrocalcinosis of the ulnocarpal joint, findings can be seen in the setting of CPPD arthropathy. Electronically Signed   By: Yetta Glassman M.D.   On: 04/26/2022 11:21    Procedures Procedures    Medications Ordered in ED Medications  predniSONE (DELTASONE) tablet 50 mg (has no administration in time range)  oxyCODONE-acetaminophen (PERCOCET/ROXICET) 5-325 MG per tablet 1 tablet (1 tablet Oral Given 04/26/22 1046)  potassium chloride SA (KLOR-CON M) CR tablet 40 mEq (40 mEq Oral Given 04/26/22 1305)    ED Course/ Medical Decision Making/ A&P Clinical Course as of 04/26/22 1432  Thu Apr 26, 2022  1152 CBC(!) CBC normal [JK]  3762 Basic metabolic panel(!) Hypokalemia noted on metabolic panel [JK]  8315 Chondrocalcinosis seen on x-ray [JK]  1350 Sed rate elevated at 58, previous was 49 [JK]  1350 Sedimentation rate(!) [JK]  1414 Case  discussed with Renda Rolls.  We will try a course of steroids.  If not getting better by tomorrow return to the ED.  Likely will require MRI at that point, possible IR joint aspiration.  [JK]    Clinical Course User Index [JK] Dorie Rank, MD                           Medical Decision Making Differential diagnosis includes infectious arthritis, inflammatory arthritis, occult fracture  Problems Addressed: Arthritis, wrist: acute illness or injury Chondrocalcinosis: chronic illness or injury with exacerbation, progression, or side effects of treatment Hypokalemia: acute illness or injury  Amount and/or Complexity of Data Reviewed Labs: ordered. Decision-making details documented in ED Course. Radiology: ordered and independent interpretation performed. Discussion of management or test interpretation with external provider(s): Case discussed with orthopedic hand surgery team, Hilbert Odor, PA.  Risk Prescription drug management.   Patient presented to the ED for evaluation of left wrist pain.  Patient has obvious swelling and pain with range of motion.  Laboratory tests do not show any leukocytosis.  Her sed rate is elevated although does not significantly change compared to previous values.  CRP is still pending.  X-ray does show findings of chondrocalcinosis.  Discussed case with orthopedic surgery Hilbert Odor.  Favor inflammatory arthritis over infectious but we will need to monitor this very closely.  Patient will be started on a course of steroids.  She is to return tomorrow if she has not noticed any significant improvement.  Plan at that time would be to proceed with MRI possible joint aspiration if she continues to improve then outpatient follow-up with orthopedic surgery.  Findings and plan discussed with the patient.  She agrees and will return tomorrow  Final Clinical Impression(s) / ED Diagnoses Final diagnoses:  Arthritis, wrist  Chondrocalcinosis   Hypokalemia    Rx / DC Orders ED Discharge Orders          Ordered    predniSONE (STERAPRED UNI-PAK 21 TAB) 10 MG (21) TBPK tablet        04/26/22 1427    potassium chloride SA (KLOR-CON M) 20 MEQ tablet  2 times daily        04/26/22 1427    oxyCODONE-acetaminophen (PERCOCET/ROXICET) 5-325 MG tablet  Every 6 hours PRN        04/26/22 1427              Dorie Rank, MD 04/26/22 1432

## 2022-04-26 NOTE — Discharge Instructions (Signed)
X-ray shows arthritis findings, chondrocalcinosis in the left wrist.  Start taking the steroid medications as prescribed.  Return to the ER tomorrow to be rechecked if you do not notice significant improvement.  At that time further evaluation with possible MRI and joint aspiration may be necessary.  Otherwise follow-up with an orthopedic doctor for further evaluation as long as her symptoms continue to improve.

## 2022-04-26 NOTE — ED Triage Notes (Signed)
BIBA for left arm pain x 4 days with burning sensation. Denies trauma, fall.

## 2022-05-07 ENCOUNTER — Other Ambulatory Visit: Payer: Self-pay | Admitting: Internal Medicine

## 2022-05-11 ENCOUNTER — Ambulatory Visit: Payer: Medicare Other | Admitting: Family Medicine

## 2022-05-11 ENCOUNTER — Ambulatory Visit: Payer: Self-pay

## 2022-05-11 VITALS — BP 192/94 | HR 62 | Ht 63.0 in | Wt 192.0 lb

## 2022-05-11 DIAGNOSIS — M25532 Pain in left wrist: Secondary | ICD-10-CM

## 2022-05-11 DIAGNOSIS — M1A9XX0 Chronic gout, unspecified, without tophus (tophi): Secondary | ICD-10-CM

## 2022-05-11 LAB — URIC ACID: Uric Acid, Serum: 2.4 mg/dL (ref 2.4–7.0)

## 2022-05-11 MED ORDER — COLCHICINE 0.6 MG PO TABS: 0.3000 mg | ORAL_TABLET | Freq: Every day | ORAL | 2 refills | Status: DC | PRN

## 2022-05-11 NOTE — Progress Notes (Signed)
I, Brooke [name], LAT, ATC acting as a scribe for Brooke Leader, MD.  Brooke [name] is a 71 y.o. female who presents to [place] at [facility] today for left wrist pain.  She was last seen by Dr. Georgina [name] on 06/12/2021 for chronic left shoulder pain.  Patient was seen at the [facility] long ED on 04/26/2022 for this complaint.  And was given oxycodone, prednisone, x-rays and labs were obtained. Pt reports pain started around the end of Sept. Patient locates pain to the ulnar aspect of the L wrist. Pt notes pain is not near as severe as when she was seen in the ED.   L wrist swelling: yes Aggravates: nothing in particular Treatments tried: prednisone, oxycodone  Dx testing: 04/26/2022 labs 04/26/2022 left wrist x-ray  Pertinent review of systems: No fevers or chills  Relevant historical information: Personal history of gout.   Exam:  BP (!) 192/94   Pulse 62   Ht '5\' 3"'$  (1.6 m)   Wt 192 lb (87.1 kg)   SpO2 93%   BMI 34.01 kg/m  General: Well Developed, well nourished, and in no acute distress.   MSK: Left wrist mild effusion.  Tender palpation dorsal ulnar wrist.  Normal wrist motion.  Intact strength.  Pulses cap refill and sensation are intact distally.    Lab and Radiology Results  Procedure: Real-time Ultrasound Guided Injection of left dorsal wrist radiocarpal joint Device: Philips Affiniti 50G Images permanently stored and available for review in PACS Ultrasound evaluation prior to injection reveals moderate wrist effusion most dominant at the ulnocarpal joint. No significant or severe tenosynovitis is present along the dorsal wrist. Verbal informed consent obtained.  Discussed risks and benefits of procedure. Warned about infection, bleeding, hyperglycemia damage to structures among others. Patient expresses understanding and agreement Time-out conducted.   Noted no overlying erythema, induration, or other signs of local infection.   Skin prepped in a  sterile fashion.   Local anesthesia: Topical Ethyl chloride.   With sterile technique and under real time ultrasound guidance: 40 mg of Kenalog and 1 mL of lidocaine injected into dorsal wrist joint. Fluid seen entering the capsule.   Completed without difficulty   Pain immediately resolved suggesting accurate placement of the medication.   Advised to call if fevers/chills, erythema, induration, drainage, or persistent bleeding.   Images permanently stored and available for review in the ultrasound unit.  Impression: Technically successful ultrasound guided injection.      EXAM: LEFT WRIST - COMPLETE 3 VIEW   COMPARISON:  None Available.   FINDINGS: No evidence of fracture or dislocation. Mild degenerative changes of the first [facility] joint and distal radioulnar joint. Diffuse demineralization. Chondrocalcinosis of the ulnocarpal joint. Mild soft tissue swelling of the wrist.   IMPRESSION: 1. No acute osseous abnormality. 2. Chondrocalcinosis of the ulnocarpal joint, findings can be seen in the setting of CPPD arthropathy.     Electronically Signed   By: Brooke [name] M.D.   On: 04/26/2022 11:21 I, Brooke [name], personally (independently) visualized and performed the interpretation of the images attached in this note.    Chemistry      Component Value Date/Time   NA 140 04/26/2022 1023   NA 144 11/15/2021 0832   K 2.8 (L) 04/26/2022 1023   CL 108 04/26/2022 1023   CO2 26 04/26/2022 1023   BUN 19 04/26/2022 1023   BUN 21 11/15/2021 0832   CREATININE 1.50 (H) 04/26/2022 1023   CREATININE 0.90 01/01/2014  1046      Component Value Date/Time   CALCIUM 8.9 04/26/2022 1023   ALKPHOS 105 12/05/2021 1022   AST 15 12/05/2021 1022   ALT 13 12/05/2021 1022   BILITOT 0.3 12/05/2021 1022   BILITOT <0.2 10/27/2021 1022    estimated creatinine clearance is 36.5 mL/min (A) (by C-G formula based on SCr of 1.5 mg/dL (H)).    Assessment and Plan: 71 y.o. female with left wrist  pain.  Pain is likely due to pseudogout based on chondrocalcinosis seen on x-ray.  Gout is also a possibility.  She did respond to a course of oral prednisone in the emergency room.  However the pain is starting to return.  We will provide a steroid injection today and start empiric colchicine.  Based on her most recent renal function with GFR 37 we will reduce the dose to 0.3 mg daily as needed. We will go and check uric acid as I do not think its been checked in quite a while.  May need to adjust or start allopurinol type medication based on results of uric acid. Recheck in 1 month.  PDMP not reviewed this encounter. Orders Placed This Encounter  Procedures   [place] LIMITED JOINT SPACE STRUCTURES UP LEFT(NO LINKED CHARGES)    Order Specific Question:   Reason for Exam (SYMPTOM  OR DIAGNOSIS REQUIRED)    Answer:   left wrist pain    Order Specific Question:   Preferred imaging location?    Answer:   [place]   Uric acid    Standing Status:   Future    Number of Occurrences:   1    Standing Expiration Date:   05/12/2023   Meds ordered this encounter  Medications   colchicine 0.6 MG tablet    Sig: Take 0.5 tablets (0.3 mg total) by mouth daily as needed (gout or psuedogout pain).    Dispense:  30 tablet    Refill:  2     Discussed warning signs or symptoms. Please see discharge instructions. Patient expresses understanding.   The above documentation has been reviewed and is accurate and complete Brooke [name], M.D.

## 2022-05-11 NOTE — Patient Instructions (Addendum)
Thank you for coming in today.   You received an injection today. Seek immediate medical attention if the joint becomes red, extremely painful, or is oozing fluid.   Please get labs today before you leave   I've sent a prescription for colchicine to your pharmacy.   Check back in 1 month

## 2022-05-14 NOTE — Progress Notes (Signed)
Uric acid is 2.4.  Gout is not causing your pain.  Pseudogout could possibly be causing your pain and the medicine I prescribed should help. Take colchicine half a pill daily as needed for wrist pain.Marland Kitchen

## 2022-05-16 ENCOUNTER — Ambulatory Visit (INDEPENDENT_AMBULATORY_CARE_PROVIDER_SITE_OTHER): Payer: Medicare Other

## 2022-05-16 ENCOUNTER — Encounter: Payer: Self-pay | Admitting: Internal Medicine

## 2022-05-16 ENCOUNTER — Ambulatory Visit (INDEPENDENT_AMBULATORY_CARE_PROVIDER_SITE_OTHER): Payer: Medicare Other | Admitting: Internal Medicine

## 2022-05-16 ENCOUNTER — Other Ambulatory Visit: Payer: Self-pay | Admitting: Internal Medicine

## 2022-05-16 VITALS — BP 172/88 | HR 62 | Temp 98.5°F | Ht 63.0 in | Wt 190.0 lb

## 2022-05-16 DIAGNOSIS — E559 Vitamin D deficiency, unspecified: Secondary | ICD-10-CM

## 2022-05-16 DIAGNOSIS — I10 Essential (primary) hypertension: Secondary | ICD-10-CM

## 2022-05-16 DIAGNOSIS — R059 Cough, unspecified: Secondary | ICD-10-CM | POA: Diagnosis not present

## 2022-05-16 DIAGNOSIS — R531 Weakness: Secondary | ICD-10-CM

## 2022-05-16 DIAGNOSIS — E1165 Type 2 diabetes mellitus with hyperglycemia: Secondary | ICD-10-CM | POA: Diagnosis not present

## 2022-05-16 DIAGNOSIS — R42 Dizziness and giddiness: Secondary | ICD-10-CM

## 2022-05-16 DIAGNOSIS — E538 Deficiency of other specified B group vitamins: Secondary | ICD-10-CM

## 2022-05-16 DIAGNOSIS — H6122 Impacted cerumen, left ear: Secondary | ICD-10-CM

## 2022-05-16 LAB — URINALYSIS, ROUTINE W REFLEX MICROSCOPIC
Bilirubin Urine: NEGATIVE
Hgb urine dipstick: NEGATIVE
Ketones, ur: NEGATIVE
Nitrite: NEGATIVE
RBC / HPF: NONE SEEN (ref 0–?)
Specific Gravity, Urine: 1.015 (ref 1.000–1.030)
Urine Glucose: >=1000 — AB
Urobilinogen, UA: 0.2 (ref 0.0–1.0)
pH: 7 (ref 5.0–8.0)

## 2022-05-16 LAB — CBC WITH DIFFERENTIAL/PLATELET
Basophils Absolute: 0.1 10*3/uL (ref 0.0–0.1)
Basophils Relative: 0.6 % (ref 0.0–3.0)
Eosinophils Absolute: 0 10*3/uL (ref 0.0–0.7)
Eosinophils Relative: 0.2 % (ref 0.0–5.0)
HCT: 36.6 % (ref 36.0–46.0)
Hemoglobin: 11.5 g/dL — ABNORMAL LOW (ref 12.0–15.0)
Lymphocytes Relative: 17.1 % (ref 12.0–46.0)
Lymphs Abs: 1.9 10*3/uL (ref 0.7–4.0)
MCHC: 31.5 g/dL (ref 30.0–36.0)
MCV: 81.8 fl (ref 78.0–100.0)
Monocytes Absolute: 1.7 10*3/uL — ABNORMAL HIGH (ref 0.1–1.0)
Monocytes Relative: 15.5 % — ABNORMAL HIGH (ref 3.0–12.0)
Neutro Abs: 7.3 10*3/uL (ref 1.4–7.7)
Neutrophils Relative %: 66.6 % (ref 43.0–77.0)
Platelets: 231 10*3/uL (ref 150.0–400.0)
RBC: 4.47 Mil/uL (ref 3.87–5.11)
RDW: 18.9 % — ABNORMAL HIGH (ref 11.5–15.5)
WBC: 10.9 10*3/uL — ABNORMAL HIGH (ref 4.0–10.5)

## 2022-05-16 LAB — HEMOGLOBIN A1C: Hgb A1c MFr Bld: 6.7 % — ABNORMAL HIGH (ref 4.6–6.5)

## 2022-05-16 LAB — MICROALBUMIN / CREATININE URINE RATIO
Creatinine,U: 52.2 mg/dL
Microalb Creat Ratio: 1.6 mg/g (ref 0.0–30.0)
Microalb, Ur: 0.8 mg/dL (ref 0.0–1.9)

## 2022-05-16 LAB — BASIC METABOLIC PANEL
BUN: 38 mg/dL — ABNORMAL HIGH (ref 6–23)
CO2: 22 mEq/L (ref 19–32)
Calcium: 9 mg/dL (ref 8.4–10.5)
Chloride: 109 mEq/L (ref 96–112)
Creatinine, Ser: 1.5 mg/dL — ABNORMAL HIGH (ref 0.40–1.20)
GFR: 35.13 mL/min — ABNORMAL LOW (ref >60.00)
Glucose, Bld: 111 mg/dL — ABNORMAL HIGH (ref 70–99)
Potassium: 4.2 mEq/L (ref 3.5–5.1)
Sodium: 138 mEq/L (ref 135–145)

## 2022-05-16 LAB — HEPATIC FUNCTION PANEL
ALT: 14 U/L (ref 0–35)
AST: 11 U/L (ref 0–37)
Albumin: 3.6 g/dL (ref 3.5–5.2)
Alkaline Phosphatase: 78 U/L (ref 39–117)
Bilirubin, Direct: 0.1 mg/dL (ref 0.0–0.3)
Total Bilirubin: 0.3 mg/dL (ref 0.2–1.2)
Total Protein: 6.4 g/dL (ref 6.0–8.3)

## 2022-05-16 LAB — LIPID PANEL
Cholesterol: 124 mg/dL (ref 0–200)
HDL: 66.2 mg/dL (ref >39.00)
LDL Cholesterol: 46 mg/dL (ref 0–99)
NonHDL: 58.08
Total CHOL/HDL Ratio: 2
Triglycerides: 60 mg/dL (ref 0.0–149.0)
VLDL: 12 mg/dL (ref 0.0–40.0)

## 2022-05-16 LAB — VITAMIN B12: Vitamin B-12: 344 pg/mL (ref 211–911)

## 2022-05-16 LAB — VITAMIN D 25 HYDROXY (VIT D DEFICIENCY, FRACTURES): VITD: 46.73 ng/mL (ref 30.00–100.00)

## 2022-05-16 LAB — MAGNESIUM: Magnesium: 1.9 mg/dL (ref 1.5–2.5)

## 2022-05-16 LAB — TSH: TSH: 2.36 u[IU]/mL (ref 0.35–5.50)

## 2022-05-16 MED ORDER — CEPHALEXIN 500 MG PO CAPS: 500.0000 mg | ORAL_CAPSULE | Freq: Three times a day (TID) | ORAL | 0 refills | Status: DC

## 2022-05-16 MED ORDER — DILTIAZEM HCL ER COATED BEADS 240 MG PO CP24: 240.0000 mg | ORAL_CAPSULE | Freq: Every day | ORAL | 3 refills | Status: DC

## 2022-05-16 NOTE — Patient Instructions (Addendum)
Please take all new medication as prescribed - the new diltiazem ER 240 mg per day for blood pressure  Your left ear was irrigated today  Please continue all other medications as before, and refills have been done if requested.  Please have the pharmacy call with any other refills you may need.  Please continue your efforts at being more active, low cholesterol diet, and weight control.  You are otherwise up to date with prevention measures today.  Please keep your appointments with your specialists as you may have planned  Please go to the XRAY Department in the first floor for the x-ray testing  Please go to the LAB at the blood drawing area for the tests to be done  You will be contacted by phone if any changes need to be made immediately.  Otherwise, you will receive a letter about your results with an explanation, but please check with MyChart first.  Please remember to sign up for MyChart if you have not done so, as this will be important to you in the future with finding out test results, communicating by private email, and scheduling acute appointments online when needed.  Please make an Appointment to return in 2 weeks, or sooner if needed

## 2022-05-16 NOTE — Progress Notes (Signed)
Patient ID: Brooke [name], female   DOB: 08-10-1950, 71 y.o.   MRN: [id number]        Chief Complaint: follow up HTN, dizziness, left cerumen impaction, DM       HPI:  Brooke [name] is a 71 y.o. female here with c/o mild increased dizziness lightheadedness, less steady on walking in the past 3 months.  Pt denies chest pain, increased sob or doe, wheezing, orthopnea, PND, increased LE swelling, palpitations, dizziness or syncope.   Pt denies polydipsia, polyuria, or new focal neuro s/s.    Pt denies fever, wt loss, night sweats, loss of appetite, or other constitutional symptoms  no falls.  Also with left ear wax impaction and reduced hearing for the past wk.   Wt Readings from Last 3 Encounters:  05/16/22 190 lb (86.2 kg)  05/11/22 192 lb (87.1 kg)  01/10/22 186 lb 12.8 oz (84.7 kg)   BP Readings from Last 3 Encounters:  05/16/22 (!) 172/88  05/11/22 (!) 192/94  04/26/22 134/70         Past Medical History:  Diagnosis Date   Alopecia    Anxiety    Aortic atherosclerosis (HCC)    Asthma    FOLLOWED BY PCP   Eczema    GERD (gastroesophageal reflux disease)    Gout    04-28-2018--- per pt stable , as been a while since last episode   Heart murmur    History of colon polyps    History of syncope 2015   Hyperlipidemia    Hypertension    Hypothyroidism    OA (osteoarthritis) of knee    bilateral   PVC's (premature ventricular contractions)    Toxic multinodular goiter    03/ 2003  s/p RAI   Type 2 diabetes mellitus ([place])    followed by pcp   Ventricular tachycardia, polymorphic ([place]) 01/04/2014   primary cardiologist-- dr Tressia Miners turner (hx monitor 2015 showed couplet PVCs, as trigger)   Wears glasses    Wears partial dentures    upper   Past Surgical History:  Procedure Laterality Date   ABDOMINAL HYSTERECTOMY  10/22/1999   WITH BSO   CATARACT EXTRACTION W/ INTRAOCULAR LENS IMPLANT Left YRS AGO   COLONOSCOPY     EXCISION ABDOMINAL WALL MASS  12-20-2005   dr Margot Chimes '@[facility]     neruofibroma   LAPAROSCOPIC CHOLECYSTECTOMY  10/01/2002   dr Margot Chimes '@[facility]'$    LEFT HEART CATHETERIZATION WITH CORONARY ANGIOGRAM N/A 01/07/2014   Procedure: LEFT HEART CATHETERIZATION WITH CORONARY ANGIOGRAM;  Surgeon: Peter M Martinique, MD;  Location: [facility] CATH LAB;  Service: Cardiovascular;  Laterality: N/A;   RASTELLI PROCEDURE  6/98 neg   RENAL ARTERY STENT Left 11/2003   angioplasty and stenting   RENAL ARTERY STENT Left 02/2005   re-stenting   TOTAL KNEE ARTHROPLASTY Right 05/05/2018   Procedure: RIGHT TOTAL KNEE ARTHROPLASTY;  Surgeon: Frederik Pear, MD;  Location: [facility];  Service: Orthopedics;  Laterality: Right;   TOTAL KNEE ARTHROPLASTY Left 09/30/2018   Procedure: TOTAL KNEE ARTHROPLASTY;  Surgeon: Melrose Nakayama, MD;  Location: [facility];  Service: Orthopedics;  Laterality: Left;    reports that she has never smoked. She has never used smokeless tobacco. She reports that she does not drink alcohol and does not use drugs. family history includes Diabetes in her mother; Heart disease in her father. Allergies  Allergen Reactions   Metformin And Related Nausea And Vomiting   Ace Inhibitors Swelling  Codeine Nausea And Vomiting and Rash   Hydrocodone Nausea And Vomiting   Tramadol Nausea And Vomiting   Tylenol [Acetaminophen] Itching and Rash   Current Outpatient Medications on File Prior to Visit  Medication Sig Dispense Refill   acetaminophen (TYLENOL) 325 MG tablet Take 650 mg by mouth every 6 (six) hours as needed for mild pain.     albuterol (VENTOLIN HFA) 108 (90 Base) MCG/ACT inhaler INHALE 2 PUFFS BY MOUTH EVERY 6 HOURS AS NEEDED FOR WHEEZING 25.5 each 2   allopurinol (ZYLOPRIM) 100 MG tablet TAKE 1 TABLET BY MOUTH EVERY DAY 90 tablet 3   AMBULATORY NON FORMULARY MEDICATION Medication Name: Rolling walker 1 Device 0   citalopram (CELEXA) 40 MG tablet TAKE 1 TABLET BY MOUTH EVERY DAY 90 tablet 3   clonazePAM (KLONOPIN) 0.5 MG tablet Take 1 tablet (0.5 mg total) by mouth  daily as needed (vertigo). 90 tablet 0   colchicine 0.6 MG tablet Take 0.5 tablets (0.3 mg total) by mouth daily as needed (gout or psuedogout pain). 30 tablet 2   empagliflozin (JARDIANCE) 25 MG TABS tablet Take 1 tablet (25 mg total) by mouth daily before breakfast. 90 tablet 3   fexofenadine (ALLEGRA) 180 MG tablet TAKE 1 TABLET BY MOUTH EVERY DAY 90 tablet 3   fluticasone-salmeterol (ADVAIR DISKUS) 250-50 MCG/ACT AEPB Inhale 1 puff into the lungs in the morning and at bedtime. 60 each 11   hydrALAZINE (APRESOLINE) 25 MG tablet Take 1 tablet (25 mg total) by mouth 3 (three) times daily. 270 tablet 3   hydrochlorothiazide (MICROZIDE) 12.5 MG capsule Take 1 capsule (12.5 mg total) by mouth daily. 90 capsule 3   meclizine (ANTIVERT) 25 MG tablet TAKE 1 TABLET BY MOUTH 3 TIMES DAILY AS NEEDED FOR DIZZINESS. 90 tablet 1   metoprolol succinate (TOPROL XL) 25 MG 24 hr tablet Take 1 tablet (25 mg total) by mouth in the morning and at bedtime. Take this with ( 100 mg) tablet daily. 180 tablet 3   metoprolol succinate (TOPROL-XL) 100 MG 24 hr tablet Take 1 tablet (100 mg total) by mouth 2 (two) times daily. Take with or immediately following a meal. 180 tablet 3   omeprazole (PRILOSEC) 40 MG capsule Take 1 capsule (40 mg total) by mouth daily. 90 capsule 3   oxybutynin (DITROPAN) 5 MG tablet TAKE 1 TABLET (5 MG TOTAL) BY MOUTH DAILY AT 10 PM. 90 tablet 3   rosuvastatin (CRESTOR) 40 MG tablet Take 1 tablet (40 mg total) by mouth daily. TAKE 1 TABLET (20 MG TOTAL) BY MOUTH DAILY 90 tablet 3   telmisartan (MICARDIS) 80 MG tablet Take 80 mg by mouth daily.     traZODone (DESYREL) 100 MG tablet TAKE 1 TABLET BY MOUTH AT BEDTIME AS NEEDED FOR SLEEP 90 tablet 1   triamcinolone (NASACORT) 55 MCG/ACT AERO nasal inhaler PLACE 2 SPRAYS INTO THE NOSE DAILY. 16.9 each 12   amLODipine (NORVASC) 5 MG tablet Take 1 tablet (5 mg total) by mouth daily. (Patient not taking: Reported on 01/10/2022) 90 tablet 3   potassium  chloride SA (KLOR-CON M) 20 MEQ tablet Take 1 tablet (20 mEq total) by mouth 2 (two) times daily for 5 days. 10 tablet 0   No current facility-administered medications on file prior to visit.        ROS:  All others reviewed and negative.  Objective        PE:  BP (!) 172/88   Pulse 62   Temp 98.5  F (36.9 C)   Ht '5\' 3"'$  (1.6 m)   Wt 190 lb (86.2 kg)   SpO2 96%   BMI 33.66 kg/m                 Constitutional: Pt appears in NAD               HENT: Head: NCAT.                Right Ear: External ear normal.                 Left Ear: External ear normal. Left cerumen impaction resolved with irrigation and hearing improved               Eyes: . Pupils are equal, round, and reactive to light. Conjunctivae and EOM are normal               Nose: without d/c or deformity               Neck: Neck supple. Gross normal ROM               Cardiovascular: Normal rate and regular rhythm.                 Pulmonary/Chest: Effort normal and breath sounds without rales or wheezing.                Abd:  Soft, NT, ND, + BS, no organomegaly               Neurological: Pt is alert. At baseline orientation, motor grossly intact               Skin: Skin is warm. No rashes, no other new lesions, LE edema - none               Psychiatric: Pt behavior is normal without agitation   Micro: none  Cardiac tracings I have personally interpreted today:  none  Pertinent Radiological findings (summarize): none   Lab Results  Component Value Date   WBC 10.9 (H) 05/16/2022   HGB 11.5 (L) 05/16/2022   HCT 36.6 05/16/2022   PLT 231.0 05/16/2022   GLUCOSE 111 (H) 05/16/2022   CHOL 124 05/16/2022   TRIG 60.0 05/16/2022   HDL 66.20 05/16/2022   LDLDIRECT 139.1 09/05/2009   LDLCALC 46 05/16/2022   ALT 14 05/16/2022   AST 11 05/16/2022   NA 138 05/16/2022   K 4.2 05/16/2022   CL 109 05/16/2022   CREATININE 1.50 (H) 05/16/2022   BUN 38 (H) 05/16/2022   CO2 22 05/16/2022   TSH 2.36 05/16/2022   INR 1.0  09/24/2018   HGBA1C 6.7 (H) 05/16/2022   MICROALBUR 0.8 05/16/2022   Assessment/Plan:  Aviyah Swetz Zenner is a 71 y.o. Black or African American [2] female with  has a past medical history of Alopecia, Anxiety, Aortic atherosclerosis ([place]), Asthma, Eczema, GERD (gastroesophageal reflux disease), Gout, Heart murmur, History of colon polyps, History of syncope (2015), Hyperlipidemia, Hypertension, Hypothyroidism, OA (osteoarthritis) of knee, PVC's (premature ventricular contractions), Toxic multinodular goiter, Type 2 diabetes mellitus ([place]), Ventricular tachycardia, polymorphic ([place]) (01/04/2014), Wears glasses, and Wears partial dentures.  Vitamin D deficiency Last vitamin D Lab Results  Component Value Date   VD25OH 46.73 05/16/2022   Stable, cont oral replacement    Left ear impacted cerumen Ceruminosis is noted.  Wax is removed by syringing and manual debridement. Instructions for home care to prevent wax buildup are given.  Essential hypertension BP Readings from Last 3 Encounters:  05/16/22 (!) 172/88  05/11/22 (!) 192/94  04/26/22 134/70   uncontrolled, pt to add diltiazem ER 240 gm qd, and continue medical treatment hydralazine 25 tid, hct 21.5 qd, toprol xl 125 bid, micardis 80   Diabetes Lab Results  Component Value Date   HGBA1C 6.7 (H) 05/16/2022   Stable, pt to continue current medical treatment jardiance 25 mg qd   Generalized weakness Etiology unclear, declines PT eval, for xray and labs as ordered including cxr, cbc  Followup: Return in about 2 weeks (around 05/30/2022).  [name], MD 05/18/2022 8:25 PM [facility]

## 2022-05-17 ENCOUNTER — Encounter: Payer: Medicare Other | Admitting: Podiatry

## 2022-05-18 ENCOUNTER — Encounter: Payer: Self-pay | Admitting: Internal Medicine

## 2022-05-18 DIAGNOSIS — R531 Weakness: Secondary | ICD-10-CM | POA: Insufficient documentation

## 2022-05-18 NOTE — Assessment & Plan Note (Signed)
Etiology unclear, declines PT eval, for xray and labs as ordered including cxr, cbc

## 2022-05-18 NOTE — Assessment & Plan Note (Signed)
Ceruminosis is noted.  Wax is removed by syringing and manual debridement. Instructions for home care to prevent wax buildup are given.  

## 2022-05-18 NOTE — Assessment & Plan Note (Signed)
BP Readings from Last 3 Encounters:  05/16/22 (!) 172/88  05/11/22 (!) 192/94  04/26/22 134/70   uncontrolled, pt to add diltiazem ER 240 gm qd, and continue medical treatment hydralazine 25 tid, hct 21.5 qd, toprol xl 125 bid, micardis 80

## 2022-05-18 NOTE — Assessment & Plan Note (Signed)
Lab Results  Component Value Date   HGBA1C 6.7 (H) 05/16/2022   Stable, pt to continue current medical treatment jardiance 25 mg qd

## 2022-05-18 NOTE — Assessment & Plan Note (Addendum)
Last vitamin D Lab Results  Component Value Date   VD25OH 46.73 05/16/2022   Stable, cont oral replacement

## 2022-05-21 ENCOUNTER — Telehealth: Payer: Self-pay | Admitting: Internal Medicine

## 2022-05-21 NOTE — Telephone Encounter (Signed)
Patient went to the; dentist and has 7 teeth infected. - She would like to know if that could have caused her dizziness and lightheadedness and shaky feelings.  Please advise.

## 2022-05-22 NOTE — Telephone Encounter (Signed)
Yes, that is a lot of teeth with infection, and would make anyone not feel as good with stamina.  The key is to treat the infection, then the stamina and symptoms should improve in the next 1-2 wks I suspect

## 2022-05-23 NOTE — Telephone Encounter (Signed)
Patient states that she was fitted for her partials for top and bottom and will be scheduled for have infected teeth removed one her dentures are ready. Patient will continue to update [place]

## 2022-06-06 DIAGNOSIS — E119 Type 2 diabetes mellitus without complications: Secondary | ICD-10-CM | POA: Diagnosis not present

## 2022-06-12 ENCOUNTER — Ambulatory Visit: Payer: Medicare Other | Admitting: Family Medicine

## 2022-06-18 ENCOUNTER — Ambulatory Visit: Payer: Medicare Other | Admitting: Family Medicine

## 2022-07-13 DIAGNOSIS — N281 Cyst of kidney, acquired: Secondary | ICD-10-CM | POA: Diagnosis not present

## 2022-07-18 ENCOUNTER — Ambulatory Visit: Payer: Medicare Other | Admitting: Family Medicine

## 2022-08-03 ENCOUNTER — Other Ambulatory Visit: Payer: Self-pay | Admitting: Internal Medicine

## 2022-08-07 NOTE — Progress Notes (Signed)
   I, [name], LAT, ATC acting as a scribe for Lynne Leader, MD.  Brooke [name] is a 72 y.o. female who presents to [place] at [facility] today for 41-monthf/u L wrist pain. Pt was last seen by Dr.Brynlea Spindler on 05/11/22 and was given a L dorsal wrist radiocarpal joint steroid injection, her uric acid was checked, and she was prescribed colchicine. Today, pt reports L wrist is all better and not bothersome.   She did take colchicine a full pill daily as needed initially right around when I first saw her in October.  However she has not taken colchicine since October.  She was unaware that her dose was at half a pill at a time.  Dx testing: 05/11/22 Uric acid 04/26/2022 labs 04/26/2022 left wrist x-ray  Pertinent review of systems: No fevers or chills  Relevant historical information: CKD   Exam:  BP (!) 152/82   Pulse 74   Ht '5\' 3"'$  (1.6 m)   Wt 186 lb (84.4 kg)   SpO2 91%   BMI 32.95 kg/m  General: Well Developed, well nourished, and in no acute distress.   MSK: Left wrist with minimal effusion nontender normal motion and strength.    Lab and Radiology Results EXAM: LEFT WRIST - COMPLETE 3 VIEW   COMPARISON:  None Available.   FINDINGS: No evidence of fracture or dislocation. Mild degenerative changes of the first [facility] and distal radioulnar joint. Diffuse demineralization. Chondrocalcinosis of the ulnocarpal joint. Mild soft tissue swelling of the wrist.   IMPRESSION: 1. No acute osseous abnormality. 2. Chondrocalcinosis of the ulnocarpal joint, findings can be seen in the setting of CPPD arthropathy.     Electronically Signed   By: LYetta GlassmanM.D.   On: 04/26/2022 11:21  I, ELynne Leader personally (independently) visualized and performed the interpretation of the images attached in this note.    Assessment and Plan: 72y.o. female with left wrist pain significantly improved.  Pain occurring about 3 months ago due to pseudogout  and some arthritis changes.  She has done extremely well with a steroid injection and very intermittent limited colchicine.  She no longer is requiring any special treatment.  Plan for watchful waiting.  If needed she could resume colchicine.  However I clarified the dose of colchicine.  Based on her CKD and GFR her dose should be one half of a pill which would be 0.3 mg daily as needed.  Additionally she could return for steroid injection.  Total encounter time 20 minutes including face-to-face time with the patient and, reviewing past medical record, and charting on the date of service.      Discussed warning signs or symptoms. Please see discharge instructions. Patient expresses understanding.   The above documentation has been reviewed and is accurate and complete ELynne Leader M.D.

## 2022-08-08 ENCOUNTER — Ambulatory Visit (INDEPENDENT_AMBULATORY_CARE_PROVIDER_SITE_OTHER): Payer: Medicare Other | Admitting: Family Medicine

## 2022-08-08 VITALS — BP 152/82 | HR 74 | Ht 63.0 in | Wt 186.0 lb

## 2022-08-08 DIAGNOSIS — M11232 Other chondrocalcinosis, left wrist: Secondary | ICD-10-CM

## 2022-08-08 NOTE — Patient Instructions (Addendum)
Thank you for coming in today.   Glad you are feeling better.  Check back if need anything in the future.  Take a 1/2 pill of the colchicine if that wrist swells up again.   Recheck as needed.

## 2022-08-16 ENCOUNTER — Other Ambulatory Visit: Payer: Self-pay | Admitting: Internal Medicine

## 2022-08-16 DIAGNOSIS — M1000 Idiopathic gout, unspecified site: Secondary | ICD-10-CM

## 2022-08-20 DIAGNOSIS — H4052X1 Glaucoma secondary to other eye disorders, left eye, mild stage: Secondary | ICD-10-CM | POA: Diagnosis not present

## 2022-08-20 DIAGNOSIS — H5712 Ocular pain, left eye: Secondary | ICD-10-CM | POA: Diagnosis not present

## 2022-08-20 DIAGNOSIS — H1820 Unspecified corneal edema: Secondary | ICD-10-CM | POA: Diagnosis not present

## 2022-08-20 DIAGNOSIS — H2000 Unspecified acute and subacute iridocyclitis: Secondary | ICD-10-CM | POA: Diagnosis not present

## 2022-08-21 DIAGNOSIS — J449 Chronic obstructive pulmonary disease, unspecified: Secondary | ICD-10-CM | POA: Diagnosis not present

## 2022-08-21 DIAGNOSIS — N183 Chronic kidney disease, stage 3 unspecified: Secondary | ICD-10-CM | POA: Diagnosis not present

## 2022-08-21 DIAGNOSIS — H4052X3 Glaucoma secondary to other eye disorders, left eye, severe stage: Secondary | ICD-10-CM | POA: Diagnosis not present

## 2022-09-04 DIAGNOSIS — E119 Type 2 diabetes mellitus without complications: Secondary | ICD-10-CM | POA: Diagnosis not present

## 2022-09-05 ENCOUNTER — Other Ambulatory Visit: Payer: Medicare Other

## 2022-09-05 NOTE — Progress Notes (Signed)
09/05/2022 Name: Brooke [name] MRN: [id number] DOB: October 03, 1950  Chief Complaint  Patient presents with   Hypertension   Brooke [name] is a 72 y.o. year old female who presented for a telephone visit.   They were referred to the pharmacist by a quality report for assistance in managing True [place] Metric: Hypertension in [place] or African American Population.  Patient is participating in a Managed Medicaid Plan:  No  Subjective: Patient outreach to address blood pressure control as well as overall medication management prior to upcoming PCP appointment. Care Team: Primary Care Provider: Biagio Borg, MD ; Next Scheduled Visit: 09/06/22  Medication Access/Adherence  Current Pharmacy:  [id number] [place] [id number] [id number]: [phone]: [phone] [id number] [place]: [phone]: [phone]-Patient reports affordability concerns with their medications: No , all currently covered at affordable copays on Medicare Part D -Patient reports access/transportation concerns to their pharmacy: No , family members are able to pick medications up for her reguarly -Patient reports adherence concerns with their medications:  No , she uses daily pill organizer and states adherence is good. -She does endorse needing a new maintenance inhaler, stating she the fluticasone/salmeterol was discontinued due to concern with elevating BP.  She is experiencing more SOB and need for albuterol rescue inhaler. -Also, requesting changing triamcinolone nasal spray to something else due to lack of efficacy  Hypertension:  Current medications: Diltiazem ER 24034maily, Hctz 12.5mg38mily, Toprol XL 100mg36m5mg 30m and telmisartan 80mg  8mcations previously tried: Ace-I  (allergy=swelling), amlodipine (recently stopped d/t edema and some hypotension s/sx)  Patient has a validated, automated, upper arm home BP cuff Current blood pressure readings readings: 136/72, 119/52, 136/67. 158/72, and 144/81  Patient denies hypotensive s/sx including dizziness, lightheadedness. This has resolved since last seen in October.  Objective:  Lab Results  Component Value Date   HGBA1C 6.7 (H) 05/16/2022   Lab Results  Component Value Date   CREATININE 1.50 (H) 05/16/2022   BUN 38 (H) 05/16/2022   NA 138 05/16/2022   K 4.2 05/16/2022   CL 109 05/16/2022   CO2 22 05/16/2022   Lab Results  Component Value Date   CHOL 124 05/16/2022   HDL 66.20 05/16/2022   LDLCALC 46 05/16/2022   LDLDIRECT 139.1 09/05/2009   TRIG 60.0 05/16/2022   CHOLHDL 2 05/16/2022   Medications Reviewed Today     Reviewed by Giang Hemme,Darlina GuysPharmacist) on 09/05/22 at 1017  Med List Status: <None>   Medication Order Taking? Sig Documenting Provider Last Dose Status Informant  acetaminophen (TYLENOL) 325 MG tablet [id number] 650 mg by mouth every 6 (six) hours as needed for mild pain. John, JBiagio Borgking Active Self  albuterol (VENTOLIN HFA) 108 (90 [id number]/ACT inhaler [id number] 2 PUFFS BY MOUTH EVERY 6 HOURS AS NEEDED FOR WHEEZING John, JBiagio Borgking Active   allopurinol (ZYLOPRIM) 100 MG tablet [id number] 1 TABLET BY MOUTH EVERY DAY John, JBiagio Borgking Active   brimonidine ([facility] Hospital ophthalmic solution [id number](three) times daily. 1 drop in the left eye three times daily [provider] Taking Active   citalopram (CELEXA) 40 MG tablet [id number] 1  TABLET BY MOUTH EVERY DAY Biagio Borg, MD Taking Active   clonazePAM [facility]) 0.5 MG tablet [id number] Yes Take 1 tablet (0.5 mg total) by mouth daily as needed (vertigo). Biagio Borg, MD Taking Active            Med Note Colin Rhein, [facility] A   Wed Sep 05, 2022  10:07 AM) Has not taken in "quite a while"  colchicine 0.6 MG tablet [id number] Yes Take 0.5 tablets (0.3 mg total) by mouth daily as needed (gout or psuedogout pain). Gregor Hams, MD Taking Active   diltiazem (CARDIZEM CD) 240 MG 24 hr capsule [id number] Yes Take 1 capsule (240 mg total) by mouth daily. Biagio Borg, MD Taking Active   dorzolamide (TRUSOPT) 2 % ophthalmic solution [id number] Yes Place 1 drop into the left eye 3 (three) times daily. [provider] Taking Active   empagliflozin (JARDIANCE) 25 MG TABS tablet [id number] Yes Take 1 tablet (25 mg total) by mouth daily before breakfast. Biagio Borg, MD Taking Active   fexofenadine [facility]) 180 MG tablet [id number] Yes TAKE 1 TABLET BY MOUTH EVERY DAY Biagio Borg, MD Taking Active   fluticasone-salmeterol (ADVAIR DISKUS) 250-50 MCG/ACT AEPB [id number] No Inhale 1 puff into the lungs in the morning and at bedtime.  Patient not taking: Reported on 09/05/2022   Biagio Borg, MD Not Taking Active            Med Note Colin Rhein, [place] A   Wed Sep 05, 2022 10:08 AM) States was taken off of but needs a maintenance inhaler  hydrALAZINE (APRESOLINE) 25 MG tablet [id number] No Take 1 tablet (25 mg total) by mouth 3 (three) times daily.  Patient not taking: Reported on 09/05/2022   Sueanne Margarita, MD Not Taking Active            Med Note Colin Rhein, [place] A   Wed Sep 05, 2022 10:09 AM) States taken off of  hydrochlorothiazide (MICROZIDE) 12.5 MG capsule [id number] Yes Take 1 capsule (12.5 mg total) by mouth daily. Biagio Borg, MD Taking Active   latanoprost (XALATAN) 0.005 % ophthalmic solution [id number] Yes Place 1 drop into the left eye at bedtime. [provider] Taking Active   meclizine (ANTIVERT) 25 MG tablet [id number] Yes TAKE 1 TABLET BY MOUTH 3 TIMES DAILY AS NEEDED FOR DIZZINESS. Biagio Borg, MD Taking Active   metoprolol succinate (TOPROL XL) 25 MG 24 hr tablet [id number] Yes Take 1 tablet (25 mg total) by mouth in the  morning and at bedtime. Take this with ( 100 mg) tablet daily. Sueanne Margarita, MD Taking Active   metoprolol succinate (TOPROL-XL) 100 MG 24 hr tablet [id number] Yes Take 1 tablet (100 mg total) by mouth 2 (two) times daily. Take with or immediately following a meal. Turner, Eber Hong, MD Taking Active   omeprazole (PRILOSEC) 40 MG capsule [id number] Yes Take 1 capsule (40 mg total) by mouth daily. Biagio Borg, MD Taking Active   oxybutynin (DITROPAN) 5 MG tablet [id number] Yes TAKE 1 TABLET (5 MG TOTAL) BY MOUTH DAILY AT 10 PM. Biagio Borg, MD Taking Active   potassium chloride SA (KLOR-CON M) 20 MEQ tablet [id number] Yes Take 1 tablet (20 mEq total) by mouth 2 (two) times daily for 5 days. Dorie Rank, MD Taking Active            Med Note Colin Rhein, [place] A   Wed Sep 05, 2022 10:11 AM) Patient takes 1BID still  rosuvastatin (CRESTOR) 40 MG tablet [id number] Yes Take 1 tablet (40 mg total) by mouth daily. TAKE 1 TABLET (20 MG TOTAL) BY MOUTH DAILY Turner, Eber Hong, MD Taking Active   telmisartan (MICARDIS) 80 MG tablet [id number] Yes Take 80 mg by mouth daily. [provider] Taking Active   traZODone (DESYREL) 100 MG tablet [id number] Yes TAKE 1 TABLET BY MOUTH EVERY DAY AT BEDTIME AS NEEDED FOR SLEEP Biagio Borg, MD Taking Active   triamcinolone (NASACORT) 55 MCG/ACT AERO nasal inhaler [id number] Yes PLACE 2 SPRAYS INTO THE NOSE DAILY. Biagio Borg, MD Taking Active            Med Note Colin Rhein, [place] A   Wed Sep 05, 2022 10:12 AM) Patient states does not work well           Assessment/Plan:   Hypertension: - Currently uncontrolled - Verifying current medication regimen with Dr. Jenny Reichmann:  patient states she was told to stop taking hydralazine, but Dr. Gwynn Burly last visit note states to continue.  She is also taking metoprolol xl BID, which is typically dosed once daily (BID for tartrate). - Patient has history of gout, and reported last flare was a month ago.  May want to consider stopping  hydrochlorothiazide, as this can increase uric acid.  Could change to loop diuretic, such as furosemide 23m but would need to monitor electrolytes closely (recent hypokalemia). - Suggested patient continue to take BP daily and record values for further evaluation of medication efficacy   Medication Management: - Currently strategy sufficient to maintain appropriate adherence to prescribed medication regimen - Recommend restarting fluticasone/salmeterol 250/50 BID, as inc in BP is likely minimal if any vs more frequent use of albuterol rescue inhaler - Recommend changing triamcinolone nasal spray to fluticasone nasal spray using 1 spray in each nostril twice daily  Follow Up Plan: None needed at this time, but I am happy to follow-up if needed after PCP appointment  CDarlina Guys PharmD, DPLA

## 2022-09-06 ENCOUNTER — Encounter: Payer: Self-pay | Admitting: Internal Medicine

## 2022-09-06 ENCOUNTER — Ambulatory Visit (INDEPENDENT_AMBULATORY_CARE_PROVIDER_SITE_OTHER): Payer: Medicare Other | Admitting: Internal Medicine

## 2022-09-06 VITALS — BP 142/90 | HR 67 | Temp 98.6°F | Ht 63.0 in | Wt 183.0 lb

## 2022-09-06 DIAGNOSIS — I10 Essential (primary) hypertension: Secondary | ICD-10-CM

## 2022-09-06 DIAGNOSIS — J449 Chronic obstructive pulmonary disease, unspecified: Secondary | ICD-10-CM | POA: Diagnosis not present

## 2022-09-06 DIAGNOSIS — Z7984 Long term (current) use of oral hypoglycemic drugs: Secondary | ICD-10-CM

## 2022-09-06 DIAGNOSIS — E538 Deficiency of other specified B group vitamins: Secondary | ICD-10-CM

## 2022-09-06 DIAGNOSIS — E559 Vitamin D deficiency, unspecified: Secondary | ICD-10-CM

## 2022-09-06 DIAGNOSIS — E7800 Pure hypercholesterolemia, unspecified: Secondary | ICD-10-CM

## 2022-09-06 DIAGNOSIS — Z23 Encounter for immunization: Secondary | ICD-10-CM | POA: Diagnosis not present

## 2022-09-06 DIAGNOSIS — E1165 Type 2 diabetes mellitus with hyperglycemia: Secondary | ICD-10-CM

## 2022-09-06 DIAGNOSIS — J309 Allergic rhinitis, unspecified: Secondary | ICD-10-CM | POA: Diagnosis not present

## 2022-09-06 DIAGNOSIS — N1831 Chronic kidney disease, stage 3a: Secondary | ICD-10-CM

## 2022-09-06 DIAGNOSIS — Z0001 Encounter for general adult medical examination with abnormal findings: Secondary | ICD-10-CM | POA: Diagnosis not present

## 2022-09-06 LAB — CBC WITH DIFFERENTIAL/PLATELET
Basophils Absolute: 0 10*3/uL (ref 0.0–0.1)
Basophils Relative: 0.5 % (ref 0.0–3.0)
Eosinophils Absolute: 0.1 10*3/uL (ref 0.0–0.7)
Eosinophils Relative: 1 % (ref 0.0–5.0)
HCT: 36.9 % (ref 36.0–46.0)
Hemoglobin: 11.7 g/dL — ABNORMAL LOW (ref 12.0–15.0)
Lymphocytes Relative: 25.3 % (ref 12.0–46.0)
Lymphs Abs: 1.7 10*3/uL (ref 0.7–4.0)
MCHC: 31.8 g/dL (ref 30.0–36.0)
MCV: 83.7 fl (ref 78.0–100.0)
Monocytes Absolute: 0.7 10*3/uL (ref 0.1–1.0)
Monocytes Relative: 10.3 % (ref 3.0–12.0)
Neutro Abs: 4.3 10*3/uL (ref 1.4–7.7)
Neutrophils Relative %: 62.9 % (ref 43.0–77.0)
Platelets: 290 10*3/uL (ref 150.0–400.0)
RBC: 4.41 Mil/uL (ref 3.87–5.11)
RDW: 17 % — ABNORMAL HIGH (ref 11.5–15.5)
WBC: 6.8 10*3/uL (ref 4.0–10.5)

## 2022-09-06 LAB — LIPID PANEL
Cholesterol: 124 mg/dL (ref 0–200)
HDL: 42.5 mg/dL (ref >39.00)
LDL Cholesterol: 64 mg/dL (ref 0–99)
NonHDL: 81.61
Total CHOL/HDL Ratio: 3
Triglycerides: 86 mg/dL (ref 0.0–149.0)
VLDL: 17.2 mg/dL (ref 0.0–40.0)

## 2022-09-06 LAB — MICROALBUMIN / CREATININE URINE RATIO
Creatinine,U: 96 mg/dL
Microalb Creat Ratio: 2.5 mg/g (ref 0.0–30.0)
Microalb, Ur: 2.4 mg/dL — ABNORMAL HIGH (ref 0.0–1.9)

## 2022-09-06 LAB — HEPATIC FUNCTION PANEL
ALT: 12 U/L (ref 0–35)
AST: 14 U/L (ref 0–37)
Albumin: 3.8 g/dL (ref 3.5–5.2)
Alkaline Phosphatase: 102 U/L (ref 39–117)
Bilirubin, Direct: 0.1 mg/dL (ref 0.0–0.3)
Total Bilirubin: 0.3 mg/dL (ref 0.2–1.2)
Total Protein: 7 g/dL (ref 6.0–8.3)

## 2022-09-06 LAB — URINALYSIS, ROUTINE W REFLEX MICROSCOPIC
Bilirubin Urine: NEGATIVE
Hgb urine dipstick: NEGATIVE
Ketones, ur: NEGATIVE
Nitrite: NEGATIVE
Specific Gravity, Urine: 1.025 (ref 1.000–1.030)
Urine Glucose: 500 — AB
Urobilinogen, UA: 0.2 (ref 0.0–1.0)
pH: 6 (ref 5.0–8.0)

## 2022-09-06 LAB — BASIC METABOLIC PANEL
BUN: 18 mg/dL (ref 6–23)
CO2: 25 mEq/L (ref 19–32)
Calcium: 9.7 mg/dL (ref 8.4–10.5)
Chloride: 107 mEq/L (ref 96–112)
Creatinine, Ser: 1.5 mg/dL — ABNORMAL HIGH (ref 0.40–1.20)
GFR: 35.05 mL/min — ABNORMAL LOW (ref >60.00)
Glucose, Bld: 98 mg/dL (ref 70–99)
Potassium: 4.1 mEq/L (ref 3.5–5.1)
Sodium: 140 mEq/L (ref 135–145)

## 2022-09-06 LAB — HEMOGLOBIN A1C: Hgb A1c MFr Bld: 6.2 % (ref 4.6–6.5)

## 2022-09-06 LAB — VITAMIN B12: Vitamin B-12: 464 pg/mL (ref 211–911)

## 2022-09-06 LAB — TSH: TSH: 1.86 u[IU]/mL (ref 0.35–5.50)

## 2022-09-06 LAB — VITAMIN D 25 HYDROXY (VIT D DEFICIENCY, FRACTURES): VITD: 58.79 ng/mL (ref 30.00–100.00)

## 2022-09-06 MED ORDER — TRELEGY ELLIPTA 100-62.5-25 MCG/ACT IN AEPB: INHALATION_SPRAY | RESPIRATORY_TRACT | 3 refills | Status: DC

## 2022-09-06 MED ORDER — HYDRALAZINE HCL 25 MG PO TABS: 25.0000 mg | ORAL_TABLET | Freq: Three times a day (TID) | ORAL | 3 refills | Status: DC

## 2022-09-06 MED ORDER — AZELASTINE HCL 0.1 % NA SOLN: 2.0000 | Freq: Two times a day (BID) | NASAL | 12 refills | Status: DC

## 2022-09-06 NOTE — Patient Instructions (Signed)
Please take all new medication as prescribed - the trelegy for copd, and hydralazine 25 mg three times per day for blood pressure, and Astelin Nasal spray  Please continue all other medications as before, and refills have been done if requested.  Please have the pharmacy call with any other refills you may need.  Please continue your efforts at being more active, low cholesterol diet, and weight control.  You are otherwise up to date with prevention measures today.  Please keep your appointments with your specialists as you may have planned  Please go to the LAB at the blood drawing area for the tests to be done  You will be contacted by phone if any changes need to be made immediately.  Otherwise, you will receive a letter about your results with an explanation, but please check with MyChart first.  Please remember to sign up for MyChart if you have not done so, as this will be important to you in the future with finding out test results, communicating by private email, and scheduling acute appointments online when needed.  Please make an Appointment to return in 6 months, or sooner if needed

## 2022-09-06 NOTE — Progress Notes (Signed)
Patient ID: Brooke [name], female   DOB: 1950-12-05, 72 y.o.   MRN: [id number]         Chief Complaint:: wellness exam and htn, allergies, recent copd exacerbation, ckd3a, dm, htn, hld       HPI:  Brooke [name] is a 72 y.o. female here for wellness exam; for shingrix at pharmacy, o/w up to date                        Also BP at home mild elevated.  Has recent diagnosis of left eye glaucoma, with f/u optho planned at [facility]. Pt denies chest pain, wheezing, orthopnea, PND, increased LE swelling, palpitations, dizziness or syncope, but has persistent mild sob after recent copd exacerbation..   Pt denies polydipsia, polyuria, or new focal neuro s/s.    Pt denies fever, wt loss, night sweats, loss of appetite, or other constitutional symptoms  Does have several wks ongoing nasal allergy symptoms with clearish congestion, itch and sneezing, without fever, pain, ST, cough, swelling or wheezing.    Wt Readings from Last 3 Encounters:  09/06/22 183 lb (83 kg)  08/08/22 186 lb (84.4 kg)  05/16/22 190 lb (86.2 kg)   BP Readings from Last 3 Encounters:  09/06/22 (!) 142/90  08/08/22 (!) 152/82  05/16/22 (!) 172/88   Immunization History  Administered Date(s) Administered   Fluad Quad(high Dose 65+) 06/21/2020, 06/05/2021, 09/06/2022   Influenza Split 04/18/2011, 04/03/2012   Influenza Whole 04/21/2008, 05/03/2009, 04/10/2010   Influenza, High Dose Seasonal PF 07/19/2017, 04/08/2018   Influenza,inj,Quad PF,6+ Mos 04/02/2013, 04/23/2014, 05/11/2015, 09/12/2016   PFIZER(Purple Top)SARS-COV-2 Vaccination 03/18/2020, 04/08/2020   Pneumococcal Conjugate-13 01/10/2017   Pneumococcal Polysaccharide-23 09/05/2009, 01/21/2018   Td 03/23/2004   Tdap 01/10/2017   Zoster, Live 08/18/2012   Health Maintenance Due  Topic Date Due   DEXA SCAN  01/14/2022   Medicare Annual Wellness (AWV)  10/14/2022      Past Medical History:  Diagnosis Date   Alopecia    Anxiety    Aortic atherosclerosis ([place])     Asthma    FOLLOWED BY PCP   Eczema    GERD (gastroesophageal reflux disease)    Gout    04-28-2018--- per pt stable , as been a while since last episode   Heart murmur    History of colon polyps    History of syncope 2015   Hyperlipidemia    Hypertension    Hypothyroidism    OA (osteoarthritis) of knee    bilateral   PVC's (premature ventricular contractions)    Toxic multinodular goiter    03/ 2003  s/p RAI   Type 2 diabetes mellitus ([place])    followed by pcp   Ventricular tachycardia, polymorphic ([place]) 01/04/2014   primary cardiologist-- dr Tressia Miners turner (hx monitor 2015 showed couplet PVCs, as trigger)   Wears glasses    Wears partial dentures    upper   Past Surgical History:  Procedure Laterality Date   ABDOMINAL HYSTERECTOMY  10/22/1999   WITH BSO   CATARACT EXTRACTION W/ INTRAOCULAR LENS IMPLANT Left YRS AGO   COLONOSCOPY     EXCISION ABDOMINAL WALL MASS  12-20-2005   dr Margot Chimes @[facility]    neruofibroma   LAPAROSCOPIC CHOLECYSTECTOMY  10/01/2002   dr Margot Chimes @[facility]    LEFT HEART CATHETERIZATION WITH CORONARY ANGIOGRAM N/A 01/07/2014   Procedure: LEFT HEART CATHETERIZATION WITH CORONARY ANGIOGRAM;  Surgeon: Peter M Martinique, MD;  Location: [facility] CATH LAB;  Service: Cardiovascular;  Laterality: N/A;   RASTELLI PROCEDURE  6/98 neg   RENAL ARTERY STENT Left 11/2003   angioplasty and stenting   RENAL ARTERY STENT Left 02/2005   re-stenting   TOTAL KNEE ARTHROPLASTY Right 05/05/2018   Procedure: RIGHT TOTAL KNEE ARTHROPLASTY;  Surgeon: Frederik Pear, MD;  Location: [facility];  Service: Orthopedics;  Laterality: Right;   TOTAL KNEE ARTHROPLASTY Left 09/30/2018   Procedure: TOTAL KNEE ARTHROPLASTY;  Surgeon: Melrose Nakayama, MD;  Location: [facility];  Service: Orthopedics;  Laterality: Left;    reports that she has never smoked. She has never used smokeless tobacco. She reports that she does not drink alcohol and does not use drugs. family history includes Diabetes in her mother; Heart disease in  her father. Allergies  Allergen Reactions   Metformin And Related Nausea And Vomiting   Ace Inhibitors Swelling        Codeine Nausea And Vomiting and Rash   Hydrocodone Nausea And Vomiting   Tramadol Nausea And Vomiting   Tylenol [Acetaminophen] Itching and Rash   Current Outpatient Medications on File Prior to Visit  Medication Sig Dispense Refill   acetaminophen (TYLENOL) 325 MG tablet Take 650 mg by mouth every 6 (six) hours as needed for mild pain.     acetaZOLAMIDE (DIAMOX) 250 MG tablet Take by mouth.     albuterol (VENTOLIN HFA) 108 (90 Base) MCG/ACT inhaler INHALE 2 PUFFS BY MOUTH EVERY 6 HOURS AS NEEDED FOR WHEEZING 25.5 each 2   allopurinol (ZYLOPRIM) 100 MG tablet TAKE 1 TABLET BY MOUTH EVERY DAY 90 tablet 2   brimonidine (ALPHAGAN) 0.2 % ophthalmic solution 3 (three) times daily. 1 drop in the left eye three times daily     citalopram (CELEXA) 40 MG tablet TAKE 1 TABLET BY MOUTH EVERY DAY 90 tablet 2   clonazePAM (KLONOPIN) 0.5 MG tablet Take 1 tablet (0.5 mg total) by mouth daily as needed (vertigo). 90 tablet 0   colchicine 0.6 MG tablet Take 0.5 tablets (0.3 mg total) by mouth daily as needed (gout or psuedogout pain). 30 tablet 2   diltiazem (CARDIZEM CD) 240 MG 24 hr capsule Take 1 capsule (240 mg total) by mouth daily. 90 capsule 3   dorzolamide (TRUSOPT) 2 % ophthalmic solution Place 1 drop into the left eye 3 (three) times daily.     empagliflozin (JARDIANCE) 25 MG TABS tablet Take 1 tablet (25 mg total) by mouth daily before breakfast. 90 tablet 3   fexofenadine (ALLEGRA) 180 MG tablet TAKE 1 TABLET BY MOUTH EVERY DAY 90 tablet 3   hydrochlorothiazide (MICROZIDE) 12.5 MG capsule Take 1 capsule (12.5 mg total) by mouth daily. 90 capsule 3   latanoprost (XALATAN) 0.005 % ophthalmic solution Place 1 drop into the left eye at bedtime.     meclizine (ANTIVERT) 25 MG tablet TAKE 1 TABLET BY MOUTH 3 TIMES DAILY AS NEEDED FOR DIZZINESS. 90 tablet 1   metoprolol succinate  (TOPROL XL) 25 MG 24 hr tablet Take 1 tablet (25 mg total) by mouth in the morning and at bedtime. Take this with ( 100 mg) tablet daily. 180 tablet 3   metoprolol succinate (TOPROL-XL) 100 MG 24 hr tablet Take 1 tablet (100 mg total) by mouth 2 (two) times daily. Take with or immediately following a meal. 180 tablet 3   omeprazole (PRILOSEC) 40 MG capsule Take 1 capsule (40 mg total) by mouth daily. 90 capsule 3   oxybutynin (DITROPAN) 5 MG tablet TAKE 1 TABLET (5 MG TOTAL) BY  MOUTH DAILY AT 10 PM. 90 tablet 3   rosuvastatin (CRESTOR) 40 MG tablet Take 1 tablet (40 mg total) by mouth daily. TAKE 1 TABLET (20 MG TOTAL) BY MOUTH DAILY 90 tablet 3   telmisartan (MICARDIS) 80 MG tablet Take 80 mg by mouth daily.     traZODone (DESYREL) 100 MG tablet TAKE 1 TABLET BY MOUTH EVERY DAY AT BEDTIME AS NEEDED FOR SLEEP 90 tablet 1   fluticasone-salmeterol (ADVAIR DISKUS) 250-50 MCG/ACT AEPB Inhale 1 puff into the lungs in the morning and at bedtime. (Patient not taking: Reported on 09/05/2022) 60 each 11   potassium chloride SA (KLOR-CON M) 20 MEQ tablet Take 1 tablet (20 mEq total) by mouth 2 (two) times daily for 5 days. 10 tablet 0   No current facility-administered medications on file prior to visit.        ROS:  All others reviewed and negative.  Objective        PE:  BP (!) 142/90 (BP Location: Right Arm, Patient Position: Sitting, Cuff Size: Large)   Pulse 67   Temp 98.6 F (37 C) (Oral)   Ht 5' 3"$  (1.6 m)   Wt 183 lb (83 kg)   SpO2 93%   BMI 32.42 kg/m                 Constitutional: Pt appears in NAD               HENT: Head: NCAT.                Right Ear: External ear normal.                 Left Ear: External ear normal.  Bilat tm's with mild erythema.  Max sinus areas non tender.  Pharynx with mild erythema, no exudate               Eyes: . Pupils are equal, round, and reactive to light. Conjunctivae and EOM are normal               Nose: without d/c or deformity               Neck:  Neck supple. Gross normal ROM               Cardiovascular: Normal rate and regular rhythm.                 Pulmonary/Chest: Effort normal and breath sounds without rales or wheezing.                Abd:  Soft, NT, ND, + BS, no organomegaly               Neurological: Pt is alert. At baseline orientation, motor grossly intact               Skin: Skin is warm. No rashes, no other new lesions, LE edema - none               Psychiatric: Pt behavior is normal without agitation   Micro: none  Cardiac tracings I have personally interpreted today:  none  Pertinent Radiological findings (summarize): none   Lab Results  Component Value Date   WBC 6.8 09/06/2022   HGB 11.7 (L) 09/06/2022   HCT 36.9 09/06/2022   PLT 290.0 09/06/2022   GLUCOSE 98 09/06/2022   CHOL 124 09/06/2022   TRIG 86.0 09/06/2022   HDL 42.50 09/06/2022   LDLDIRECT 139.1 09/05/2009  LDLCALC 64 09/06/2022   ALT 12 09/06/2022   AST 14 09/06/2022   NA 140 09/06/2022   K 4.1 09/06/2022   CL 107 09/06/2022   CREATININE 1.50 (H) 09/06/2022   BUN 18 09/06/2022   CO2 25 09/06/2022   TSH 1.86 09/06/2022   INR 1.0 09/24/2018   HGBA1C 6.2 09/06/2022   MICROALBUR 2.4 (H) 09/06/2022   Assessment/Plan:  Brooke [name] is a 72 y.o. Black or African American [2] female with  has a past medical history of Alopecia, Anxiety, Aortic atherosclerosis ([place]), Asthma, Eczema, GERD (gastroesophageal reflux disease), Gout, Heart murmur, History of colon polyps, History of syncope (2015), Hyperlipidemia, Hypertension, Hypothyroidism, OA (osteoarthritis) of knee, PVC's (premature ventricular contractions), Toxic multinodular goiter, Type 2 diabetes mellitus ([place]), Ventricular tachycardia, polymorphic ([place]) (01/04/2014), Wears glasses, and Wears partial dentures.  Encounter for well adult exam with abnormal findings Age and sex appropriate education and counseling updated with regular exercise and diet Referrals for preventative services -  none needed Immunizations addressed - for shingrix at pharmacy Smoking counseling  - none needed Evidence for depression or other mood disorder - none significant Most recent labs reviewed. I have personally reviewed and have noted: 1) the patient's medical and social history 2) The patient's current medications and supplements 3) The patient's height, weight, and BMI have been recorded in the chart   CKD (chronic kidney disease) stage 3, GFR 30-59 ml/min (HCC) Lab Results  Component Value Date   CREATININE 1.50 (H) 09/06/2022   Stable overall, cont to avoid nephrotoxins   Diabetes Lab Results  Component Value Date   HGBA1C 6.2 09/06/2022   Stable, pt to continue current medical treatment jardiance 25 mg qd   Essential hypertension BP Readings from Last 3 Encounters:  09/06/22 (!) 142/90  08/08/22 (!) 152/82  05/16/22 (!) 172/88   Uncontrolled, to add hydralzine 25 mg tid,  pt to continue medical treatment card cd 240 mg qd, toprol xl 50 qd, hct 12.5 qd   Hyperlipidemia Lab Results  Component Value Date   LDLCALC 64 09/06/2022   Stable, pt to continue current statin crestor 40 mg qd    Vitamin D deficiency Last vitamin D Lab Results  Component Value Date   VD25OH 58.79 09/06/2022   Stable, cont oral replacement   Allergic rhinitis Mild to mod, for change nasacort to astelin per pt preference, to f/u any worsening symptoms or concerns  COPD (chronic obstructive pulmonary disease) (HCC) Cont albuterol hfa prn, add trelegy 1 puff qd Followup: Return in about 6 months (around 03/07/2023).  [name], MD 09/08/2022 5:39 PM [facility]

## 2022-09-08 ENCOUNTER — Encounter: Payer: Self-pay | Admitting: Internal Medicine

## 2022-09-08 DIAGNOSIS — J449 Chronic obstructive pulmonary disease, unspecified: Secondary | ICD-10-CM | POA: Insufficient documentation

## 2022-09-08 NOTE — Assessment & Plan Note (Signed)
Mild to mod, for change nasacort to astelin per pt preference, to f/u any worsening symptoms or concerns

## 2022-09-08 NOTE — Assessment & Plan Note (Signed)
Lab Results  Component Value Date   HGBA1C 6.2 09/06/2022   Stable, pt to continue current medical treatment jardiance 25 mg qd

## 2022-09-08 NOTE — Assessment & Plan Note (Signed)
Lab Results  Component Value Date   CREATININE 1.50 (H) 09/06/2022   Stable overall, cont to avoid nephrotoxins

## 2022-09-08 NOTE — Assessment & Plan Note (Signed)
BP Readings from Last 3 Encounters:  09/06/22 (!) 142/90  08/08/22 (!) 152/82  05/16/22 (!) 172/88   Uncontrolled, to add hydralzine 25 mg tid,  pt to continue medical treatment card cd 240 mg qd, toprol xl 50 qd, hct 12.5 qd

## 2022-09-08 NOTE — Assessment & Plan Note (Signed)
Lab Results  Component Value Date   LDLCALC 64 09/06/2022   Stable, pt to continue current statin crestor 40 mg qd

## 2022-09-08 NOTE — Assessment & Plan Note (Signed)
Last vitamin D Lab Results  Component Value Date   VD25OH 58.79 09/06/2022   Stable, cont oral replacement

## 2022-09-08 NOTE — Assessment & Plan Note (Signed)
Cont albuterol hfa prn, add trelegy 1 puff qd

## 2022-09-08 NOTE — Assessment & Plan Note (Signed)
Age and sex appropriate education and counseling updated with regular exercise and diet Referrals for preventative services - none needed Immunizations addressed - for shingrix at pharmacy Smoking counseling  - none needed Evidence for depression or other mood disorder - none significant Most recent labs reviewed. I have personally reviewed and have noted: 1) the patient's medical and social history 2) The patient's current medications and supplements 3) The patient's height, weight, and BMI have been recorded in the chart

## 2022-09-13 DIAGNOSIS — Z791 Long term (current) use of non-steroidal anti-inflammatories (NSAID): Secondary | ICD-10-CM | POA: Diagnosis not present

## 2022-09-13 DIAGNOSIS — N281 Cyst of kidney, acquired: Secondary | ICD-10-CM | POA: Diagnosis not present

## 2022-09-13 DIAGNOSIS — D631 Anemia in chronic kidney disease: Secondary | ICD-10-CM | POA: Diagnosis not present

## 2022-09-13 DIAGNOSIS — I129 Hypertensive chronic kidney disease with stage 1 through stage 4 chronic kidney disease, or unspecified chronic kidney disease: Secondary | ICD-10-CM | POA: Diagnosis not present

## 2022-09-13 DIAGNOSIS — N2581 Secondary hyperparathyroidism of renal origin: Secondary | ICD-10-CM | POA: Diagnosis not present

## 2022-09-13 DIAGNOSIS — R809 Proteinuria, unspecified: Secondary | ICD-10-CM | POA: Diagnosis not present

## 2022-09-13 DIAGNOSIS — N1832 Chronic kidney disease, stage 3b: Secondary | ICD-10-CM | POA: Diagnosis not present

## 2022-09-13 DIAGNOSIS — N390 Urinary tract infection, site not specified: Secondary | ICD-10-CM | POA: Diagnosis not present

## 2022-09-14 DIAGNOSIS — H2000 Unspecified acute and subacute iridocyclitis: Secondary | ICD-10-CM | POA: Diagnosis not present

## 2022-09-14 DIAGNOSIS — H1820 Unspecified corneal edema: Secondary | ICD-10-CM | POA: Diagnosis not present

## 2022-09-14 DIAGNOSIS — H4052X1 Glaucoma secondary to other eye disorders, left eye, mild stage: Secondary | ICD-10-CM | POA: Diagnosis not present

## 2022-09-29 ENCOUNTER — Other Ambulatory Visit: Payer: Self-pay | Admitting: Family Medicine

## 2022-09-29 ENCOUNTER — Other Ambulatory Visit: Payer: Self-pay | Admitting: Cardiology

## 2022-10-01 NOTE — Telephone Encounter (Signed)
Rx refill request approved per Dr. Corey's orders. 

## 2022-10-06 ENCOUNTER — Other Ambulatory Visit: Payer: Self-pay | Admitting: Internal Medicine

## 2022-10-12 ENCOUNTER — Other Ambulatory Visit: Payer: Self-pay | Admitting: Internal Medicine

## 2022-10-12 DIAGNOSIS — E2839 Other primary ovarian failure: Secondary | ICD-10-CM

## 2022-10-15 ENCOUNTER — Encounter: Payer: Medicare Other | Admitting: Internal Medicine

## 2022-10-15 ENCOUNTER — Ambulatory Visit (INDEPENDENT_AMBULATORY_CARE_PROVIDER_SITE_OTHER): Payer: Medicare Other

## 2022-10-15 VITALS — Ht 63.0 in | Wt 183.0 lb

## 2022-10-15 DIAGNOSIS — Z0000 Encounter for general adult medical examination without abnormal findings: Secondary | ICD-10-CM

## 2022-10-15 DIAGNOSIS — Z1239 Encounter for other screening for malignant neoplasm of breast: Secondary | ICD-10-CM | POA: Diagnosis not present

## 2022-10-15 NOTE — Patient Instructions (Addendum)
Brooke [name] , Thank you for taking time to come for your Medicare Wellness Visit. I appreciate your ongoing commitment to your health goals. Please review the following plan we discussed and let me know if I can assist you in the future.   These are the goals we discussed:  Goals      Client understands the importance of follow-up with providers by attending scheduled visits        This is a list of the screening recommended for you and due dates:  Health Maintenance  Topic Date Due   DEXA scan (bone density measurement)  01/14/2022   Mammogram  09/15/2022   Zoster (Shingles) Vaccine (1 of 2) 12/05/2022*   Hemoglobin A1C  03/07/2023   Eye exam for diabetics  08/21/2023   Yearly kidney function blood test for diabetes  09/07/2023   Yearly kidney health urinalysis for diabetes  09/07/2023   Complete foot exam   09/07/2023   Medicare Annual Wellness Visit  10/15/2023   DTaP/Tdap/Td vaccine (3 - Td or Tdap) 01/11/2027   Pneumonia Vaccine  Completed   Flu Shot  Completed   Hepatitis C Screening: USPSTF Recommendation to screen - Ages 57-79 yo.  Completed   HPV Vaccine  Aged Out   Colon Cancer Screening  Discontinued   COVID-19 Vaccine  Discontinued  *Topic was postponed. The date shown is not the original due date.    Advanced directives: No  Conditions/risks identified: Yes  Next appointment: Follow up in one year for your annual wellness visit.   Preventive Care 33 Years and Older, Female Preventive care refers to lifestyle choices and visits with your health care provider that can promote health and wellness. What does preventive care include? A yearly physical exam. This is also called an annual well check. Dental exams once or twice a year. Routine eye exams. Ask your health care provider how often you should have your eyes checked. Personal lifestyle choices, including: Daily care of your teeth and gums. Regular physical activity. Eating a healthy diet. Avoiding  tobacco and drug use. Limiting alcohol use. Practicing safe sex. Taking low-dose aspirin every day. Taking vitamin and mineral supplements as recommended by your health care provider. What happens during an annual well check? The services and screenings done by your health care provider during your annual well check will depend on your age, overall health, lifestyle risk factors, and family history of disease. Counseling  Your health care provider may ask you questions about your: Alcohol use. Tobacco use. Drug use. Emotional well-being. Home and relationship well-being. Sexual activity. Eating habits. History of falls. Memory and ability to understand (cognition). Work and work Statistician. Reproductive health. Screening  You may have the following tests or measurements: Height, weight, and BMI. Blood pressure. Lipid and cholesterol levels. These may be checked every 5 years, or more frequently if you are over 36 years old. Skin check. Lung cancer screening. You may have this screening every year starting at age 18 if you have a 30-pack-year history of smoking and currently smoke or have quit within the past 15 years. Fecal occult blood test (FOBT) of the stool. You may have this test every year starting at age 29. Flexible sigmoidoscopy or colonoscopy. You may have a sigmoidoscopy every 5 years or a colonoscopy every 10 years starting at age 63. Hepatitis C blood test. Hepatitis B blood test. Sexually transmitted disease (STD) testing. Diabetes screening. This is done by checking your blood sugar (glucose) after you have not  eaten for a while (fasting). You may have this done every 1-3 years. Bone density scan. This is done to screen for osteoporosis. You may have this done starting at age 62. Mammogram. This may be done every 1-2 years. Talk to your health care provider about how often you should have regular mammograms. Talk with your health care provider about your test  results, treatment options, and if necessary, the need for more tests. Vaccines  Your health care provider may recommend certain vaccines, such as: Influenza vaccine. This is recommended every year. Tetanus, diphtheria, and acellular pertussis (Tdap, Td) vaccine. You may need a Td booster every 10 years. Zoster vaccine. You may need this after age 107. Pneumococcal 13-valent conjugate (PCV13) vaccine. One dose is recommended after age 13. Pneumococcal polysaccharide (PPSV23) vaccine. One dose is recommended after age 80. Talk to your health care provider about which screenings and vaccines you need and how often you need them. This information is not intended to replace advice given to you by your health care provider. Make sure you discuss any questions you have with your health care provider. Document Released: 08/05/2015 Document Revised: 03/28/2016 Document Reviewed: 05/10/2015 Elsevier Interactive Patient Education  2017 [place] Prevention in the Home Falls can cause injuries. They can happen to people of all ages. There are many things you can do to make your home safe and to help prevent falls. What can I do on the outside of my home? Regularly fix the edges of walkways and driveways and fix any cracks. Remove anything that might make you trip as you walk through a door, such as a raised step or threshold. Trim any bushes or trees on the path to your home. Use bright outdoor lighting. Clear any walking paths of anything that might make someone trip, such as rocks or tools. Regularly check to see if handrails are loose or broken. Make sure that both sides of any steps have handrails. Any raised decks and porches should have guardrails on the edges. Have any leaves, snow, or ice cleared regularly. Use sand or salt on walking paths during winter. Clean up any spills in your garage right away. This includes oil or grease spills. What can I do in the bathroom? Use night  lights. Install grab bars by the toilet and in the tub and shower. Do not use towel bars as grab bars. Use non-skid mats or decals in the tub or shower. If you need to sit down in the shower, use a plastic, non-slip stool. Keep the floor dry. Clean up any water that spills on the floor as soon as it happens. Remove soap buildup in the tub or shower regularly. Attach bath mats securely with double-sided non-slip rug tape. Do not have throw rugs and other things on the floor that can make you trip. What can I do in the bedroom? Use night lights. Make sure that you have a light by your bed that is easy to reach. Do not use any sheets or blankets that are too big for your bed. They should not hang down onto the floor. Have a firm chair that has side arms. You can use this for support while you get dressed. Do not have throw rugs and other things on the floor that can make you trip. What can I do in the kitchen? Clean up any spills right away. Avoid walking on wet floors. Keep items that you use a lot in easy-to-reach places. If you need to reach  something above you, use a strong step stool that has a grab bar. Keep electrical cords out of the way. Do not use floor polish or wax that makes floors slippery. If you must use wax, use non-skid floor wax. Do not have throw rugs and other things on the floor that can make you trip. What can I do with my stairs? Do not leave any items on the stairs. Make sure that there are handrails on both sides of the stairs and use them. Fix handrails that are broken or loose. Make sure that handrails are as long as the stairways. Check any carpeting to make sure that it is firmly attached to the stairs. Fix any carpet that is loose or worn. Avoid having throw rugs at the top or bottom of the stairs. If you do have throw rugs, attach them to the floor with carpet tape. Make sure that you have a light switch at the top of the stairs and the bottom of the stairs. If  you do not have them, ask someone to add them for you. What else can I do to help prevent falls? Wear shoes that: Do not have high heels. Have rubber bottoms. Are comfortable and fit you well. Are closed at the toe. Do not wear sandals. If you use a stepladder: Make sure that it is fully opened. Do not climb a closed stepladder. Make sure that both sides of the stepladder are locked into place. Ask someone to hold it for you, if possible. Clearly mark and make sure that you can see: Any grab bars or handrails. First and last steps. Where the edge of each step is. Use tools that help you move around (mobility aids) if they are needed. These include: Canes. Walkers. Scooters. Crutches. Turn on the lights when you go into a dark area. Replace any light bulbs as soon as they burn out. Set up your furniture so you have a clear path. Avoid moving your furniture around. If any of your floors are uneven, fix them. If there are any pets around you, be aware of where they are. Review your medicines with your doctor. Some medicines can make you feel dizzy. This can increase your chance of falling. Ask your doctor what other things that you can do to help prevent falls. This information is not intended to replace advice given to you by your health care provider. Make sure you discuss any questions you have with your health care provider. Document Released: 05/05/2009 Document Revised: 12/15/2015 Document Reviewed: 08/13/2014 Elsevier Interactive Patient Education  2017 [employer].
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
## 2022-10-16 ENCOUNTER — Ambulatory Visit (INDEPENDENT_AMBULATORY_CARE_PROVIDER_SITE_OTHER)
Admission: RE | Admit: 2022-10-16 | Discharge: 2022-10-16 | Disposition: A | Discharge status: Home / Self Care | Payer: Medicare Other | Source: Ambulatory Visit | Attending: Internal Medicine | Admitting: Internal Medicine

## 2022-10-16 DIAGNOSIS — E2839 Other primary ovarian failure: Secondary | ICD-10-CM

## 2022-10-17 DIAGNOSIS — H2702 Aphakia, left eye: Secondary | ICD-10-CM | POA: Diagnosis not present

## 2022-10-17 DIAGNOSIS — H4052X3 Glaucoma secondary to other eye disorders, left eye, severe stage: Secondary | ICD-10-CM | POA: Diagnosis not present

## 2022-10-19 ENCOUNTER — Encounter: Payer: Self-pay | Admitting: Internal Medicine

## 2022-10-29 ENCOUNTER — Other Ambulatory Visit: Payer: Self-pay | Admitting: Cardiology

## 2022-10-29 ENCOUNTER — Other Ambulatory Visit: Payer: Self-pay | Admitting: Internal Medicine

## 2022-10-29 ENCOUNTER — Telehealth: Payer: Self-pay | Admitting: Internal Medicine

## 2022-10-29 NOTE — Telephone Encounter (Signed)
Prescription Request  10/29/2022  LOV: 09/06/2022  What is the name of the medication or equipment? potassium chloride SA (KLOR-CON M) 20 MEQ tablet   Have you contacted your pharmacy to request a refill? No   [id number] - [place], [place] - [address] AT [facility] OF [address] [place] [address] Phone: [phone] Fax: [phone]     Patient notified that their request is being sent to the clinical staff for review and that they should receive a response within 2 business days.   Please advise at Mobile [phone] (mobile)

## 2022-10-29 NOTE — Telephone Encounter (Signed)
Yes, this is not a chronic medication on our list.  thanks

## 2022-10-29 NOTE — Telephone Encounter (Signed)
Left message informing patient to contact Dr.Knapps office for refills

## 2022-10-29 NOTE — Telephone Encounter (Signed)
See below

## 2022-10-29 NOTE — Telephone Encounter (Signed)
Sorry no, Dr Lynelle Doctor is an ER doctor that prescribed the potassium pills for a short time only last yr, but was not meant to be continued long term, thanks

## 2022-10-29 NOTE — Telephone Encounter (Signed)
Patient returned call. She said she does not know who Dr. Lynelle Doctor is. She wants to know if there is any way Dr. Jonny Ruiz can fill it. Best call back is [phone].

## 2022-10-29 NOTE — Telephone Encounter (Signed)
Notified pt w/MD response.../lmb 

## 2022-11-20 NOTE — Telephone Encounter (Signed)
error 

## 2022-11-26 ENCOUNTER — Other Ambulatory Visit: Payer: Self-pay | Admitting: Cardiology

## 2022-12-03 ENCOUNTER — Ambulatory Visit
Admission: RE | Admit: 2022-12-03 | Discharge: 2022-12-03 | Disposition: A | Discharge status: Home / Self Care | Payer: Medicare Other | Source: Ambulatory Visit | Attending: Internal Medicine | Admitting: Internal Medicine

## 2022-12-03 DIAGNOSIS — E119 Type 2 diabetes mellitus without complications: Secondary | ICD-10-CM | POA: Diagnosis not present

## 2022-12-03 DIAGNOSIS — Z1239 Encounter for other screening for malignant neoplasm of breast: Secondary | ICD-10-CM

## 2022-12-03 DIAGNOSIS — Z1231 Encounter for screening mammogram for malignant neoplasm of breast: Secondary | ICD-10-CM | POA: Diagnosis not present

## 2022-12-13 DIAGNOSIS — H4052X3 Glaucoma secondary to other eye disorders, left eye, severe stage: Secondary | ICD-10-CM | POA: Diagnosis not present

## 2022-12-13 DIAGNOSIS — H2702 Aphakia, left eye: Secondary | ICD-10-CM | POA: Diagnosis not present

## 2023-01-23 ENCOUNTER — Other Ambulatory Visit: Payer: Self-pay | Admitting: Cardiology

## 2023-01-23 ENCOUNTER — Other Ambulatory Visit: Payer: Self-pay | Admitting: Internal Medicine

## 2023-01-23 MED ORDER — METOPROLOL SUCCINATE ER 100 MG PO TB24: ORAL_TABLET | ORAL | 0 refills | Status: DC

## 2023-02-22 ENCOUNTER — Other Ambulatory Visit: Payer: Self-pay | Admitting: Internal Medicine

## 2023-02-22 DIAGNOSIS — M1000 Idiopathic gout, unspecified site: Secondary | ICD-10-CM

## 2023-02-25 ENCOUNTER — Ambulatory Visit (INDEPENDENT_AMBULATORY_CARE_PROVIDER_SITE_OTHER): Payer: Medicare Other | Admitting: Internal Medicine

## 2023-02-25 VITALS — BP 138/78 | HR 62 | Temp 98.3°F | Ht 63.0 in | Wt 189.2 lb

## 2023-02-25 DIAGNOSIS — Z87820 Personal history of traumatic brain injury: Secondary | ICD-10-CM | POA: Diagnosis not present

## 2023-02-25 DIAGNOSIS — Z7984 Long term (current) use of oral hypoglycemic drugs: Secondary | ICD-10-CM | POA: Diagnosis not present

## 2023-02-25 DIAGNOSIS — J309 Allergic rhinitis, unspecified: Secondary | ICD-10-CM | POA: Diagnosis not present

## 2023-02-25 DIAGNOSIS — F418 Other specified anxiety disorders: Secondary | ICD-10-CM

## 2023-02-25 DIAGNOSIS — J4531 Mild persistent asthma with (acute) exacerbation: Secondary | ICD-10-CM

## 2023-02-25 DIAGNOSIS — E7800 Pure hypercholesterolemia, unspecified: Secondary | ICD-10-CM | POA: Diagnosis not present

## 2023-02-25 DIAGNOSIS — I10 Essential (primary) hypertension: Secondary | ICD-10-CM

## 2023-02-25 DIAGNOSIS — R053 Chronic cough: Secondary | ICD-10-CM | POA: Diagnosis not present

## 2023-02-25 DIAGNOSIS — N1831 Chronic kidney disease, stage 3a: Secondary | ICD-10-CM | POA: Diagnosis not present

## 2023-02-25 DIAGNOSIS — E1165 Type 2 diabetes mellitus with hyperglycemia: Secondary | ICD-10-CM

## 2023-02-25 DIAGNOSIS — E559 Vitamin D deficiency, unspecified: Secondary | ICD-10-CM

## 2023-02-25 LAB — HEMOGLOBIN A1C: Hgb A1c MFr Bld: 6.2 % (ref 4.6–6.5)

## 2023-02-25 LAB — LIPID PANEL
Cholesterol: 119 mg/dL (ref 0–200)
HDL: 44.8 mg/dL (ref >39.00)
LDL Cholesterol: 56 mg/dL (ref 0–99)
NonHDL: 73.94
Total CHOL/HDL Ratio: 3
Triglycerides: 91 mg/dL (ref 0.0–149.0)
VLDL: 18.2 mg/dL (ref 0.0–40.0)

## 2023-02-25 LAB — HEPATIC FUNCTION PANEL
ALT: 12 U/L (ref 0–35)
AST: 15 U/L (ref 0–37)
Albumin: 3.7 g/dL (ref 3.5–5.2)
Alkaline Phosphatase: 116 U/L (ref 39–117)
Bilirubin, Direct: 0 mg/dL (ref 0.0–0.3)
Total Bilirubin: 0.2 mg/dL (ref 0.2–1.2)
Total Protein: 6.9 g/dL (ref 6.0–8.3)

## 2023-02-25 LAB — BASIC METABOLIC PANEL
BUN: 17 mg/dL (ref 6–23)
CO2: 26 mEq/L (ref 19–32)
Calcium: 9.5 mg/dL (ref 8.4–10.5)
Chloride: 105 mEq/L (ref 96–112)
Creatinine, Ser: 1.63 mg/dL — ABNORMAL HIGH (ref 0.40–1.20)
GFR: 31.62 mL/min — ABNORMAL LOW (ref >60.00)
Glucose, Bld: 101 mg/dL — ABNORMAL HIGH (ref 70–99)
Potassium: 3.5 mEq/L (ref 3.5–5.1)
Sodium: 140 mEq/L (ref 135–145)

## 2023-02-25 MED ORDER — PREDNISONE 10 MG PO TABS: ORAL_TABLET | ORAL | 0 refills | Status: DC

## 2023-02-25 MED ORDER — CLONAZEPAM 0.5 MG PO TABS: 0.5000 mg | ORAL_TABLET | Freq: Every day | ORAL | 1 refills | Status: DC | PRN

## 2023-02-25 MED ORDER — HYDROCODONE BIT-HOMATROP MBR 5-1.5 MG/5ML PO SOLN: 5.0000 mL | Freq: Four times a day (QID) | ORAL | 0 refills | Status: AC | PRN

## 2023-02-25 MED ORDER — FEXOFENADINE HCL 180 MG PO TABS: 180.0000 mg | ORAL_TABLET | Freq: Every day | ORAL | 3 refills | Status: DC

## 2023-02-25 MED ORDER — MECLIZINE HCL 25 MG PO TABS: ORAL_TABLET | ORAL | 1 refills | Status: DC

## 2023-02-25 MED ORDER — AZELASTINE HCL 0.1 % NA SOLN: 2.0000 | Freq: Two times a day (BID) | NASAL | 12 refills | Status: DC

## 2023-02-25 NOTE — Progress Notes (Unsigned)
Patient ID: Brooke [name], female   DOB: 1950/09/09, 72 y.o.   MRN: [id number]        Chief Complaint: follow up allergies, asthma, dm, chronic, cough, headache after fall       HPI:  Brooke [name] is a 72 y.o. female here overall ok but Does have several wks ongoing nasal allergy symptoms with clearish congestion, itch and sneezing, without fever, pain, ST, cough, swelling but does have mild wheezing sob intermittent as well.   Pt denies polydipsia, polyuria, or new focal neuro s/s.   Has ongoing chronic cough unknown etiology.  Did have a stumble and fall 3 wks ago with persistent HA after hit head on wall that persists, with some increased unsteady gait.  No further falls.         Wt Readings from Last 3 Encounters:  02/25/23 189 lb 3.2 oz (85.8 kg)  10/15/22 183 lb (83 kg)  09/06/22 183 lb (83 kg)   BP Readings from Last 3 Encounters:  02/25/23 138/78  09/06/22 (!) 142/90  08/08/22 (!) 152/82         Past Medical History:  Diagnosis Date   Alopecia    Anxiety    Aortic atherosclerosis (HCC)    Asthma    FOLLOWED BY PCP   Eczema    GERD (gastroesophageal reflux disease)    Gout    04-28-2018--- per pt stable , as been a while since last episode   Heart murmur    History of colon polyps    History of syncope 2015   Hyperlipidemia    Hypertension    Hypothyroidism    OA (osteoarthritis) of knee    bilateral   PVC's (premature ventricular contractions)    Toxic multinodular goiter    03/ 2003  s/p RAI   Type 2 diabetes mellitus (HCC)    followed by pcp   Ventricular tachycardia, polymorphic (HCC) 01/04/2014   primary cardiologist-- dr Gloris [place] turner (hx monitor 2015 showed couplet PVCs, as trigger)   Wears glasses    Wears partial dentures    upper   Past Surgical History:  Procedure Laterality Date   ABDOMINAL HYSTERECTOMY  10/22/1999   WITH BSO   CATARACT EXTRACTION W/ INTRAOCULAR LENS IMPLANT Left YRS AGO   COLONOSCOPY     EXCISION ABDOMINAL WALL MASS  12-20-2005    dr Jamey Ripa @[facility]    neruofibroma   LAPAROSCOPIC CHOLECYSTECTOMY  10/01/2002   dr Jamey Ripa @[facility]    LEFT HEART CATHETERIZATION WITH CORONARY ANGIOGRAM N/A 01/07/2014   Procedure: LEFT HEART CATHETERIZATION WITH CORONARY ANGIOGRAM;  Surgeon: Peter M Swaziland, MD;  Location: [facility] CATH LAB;  Service: Cardiovascular;  Laterality: N/A;   RASTELLI PROCEDURE  6/98 neg   RENAL ARTERY STENT Left 11/2003   angioplasty and stenting   RENAL ARTERY STENT Left 02/2005   re-stenting   TOTAL KNEE ARTHROPLASTY Right 05/05/2018   Procedure: RIGHT TOTAL KNEE ARTHROPLASTY;  Surgeon: Gean Birchwood, MD;  Location: [facility];  Service: Orthopedics;  Laterality: Right;   TOTAL KNEE ARTHROPLASTY Left 09/30/2018   Procedure: TOTAL KNEE ARTHROPLASTY;  Surgeon: Marcene Corning, MD;  Location: [facility];  Service: Orthopedics;  Laterality: Left;    reports that she has never smoked. She has never used smokeless tobacco. She reports that she does not drink alcohol and does not use drugs. family history includes Diabetes in her mother; Heart disease in her father. Allergies  Allergen Reactions   Metformin And Related Nausea And Vomiting   Ace  Inhibitors Swelling        Codeine Nausea And Vomiting and Rash   Hydrocodone Nausea And Vomiting   Tramadol Nausea And Vomiting   Tylenol [Acetaminophen] Itching and Rash   Current Outpatient Medications on File Prior to Visit  Medication Sig Dispense Refill   acetaminophen (TYLENOL) 325 MG tablet Take 650 mg by mouth every 6 (six) hours as needed for mild pain.     acetaZOLAMIDE (DIAMOX) 250 MG tablet Take by mouth.     albuterol (VENTOLIN HFA) 108 (90 Base) MCG/ACT inhaler INHALE 2 PUFFS BY MOUTH EVERY 6 HOURS AS NEEDED FOR WHEEZING 25.5 each 2   allopurinol (ZYLOPRIM) 100 MG tablet TAKE 1 TABLET BY MOUTH EVERY DAY 90 tablet 2   brimonidine (ALPHAGAN) 0.2 % ophthalmic solution 3 (three) times daily. 1 drop in the left eye three times daily     citalopram (CELEXA) 40 MG tablet TAKE 1  TABLET BY MOUTH EVERY DAY 90 tablet 2   colchicine 0.6 MG tablet TAKE 0.5 TABLETS (0.3 MG TOTAL) BY MOUTH DAILY AS NEEDED (GOUT OR PSUEDOGOUT PAIN). 45 tablet 1   diltiazem (CARDIZEM CD) 240 MG 24 hr capsule Take 1 capsule (240 mg total) by mouth daily. 90 capsule 3   dorzolamide (TRUSOPT) 2 % ophthalmic solution Place 1 drop into the left eye 3 (three) times daily.     empagliflozin (JARDIANCE) 25 MG TABS tablet TAKE 1 TABLET BY MOUTH DAILY BEFORE BREAKFAST. 90 tablet 3   fluticasone-salmeterol (ADVAIR DISKUS) 250-50 MCG/ACT AEPB Inhale 1 puff into the lungs in the morning and at bedtime. 60 each 11   Fluticasone-Umeclidin-Vilant (TRELEGY ELLIPTA) 100-62.5-25 MCG/ACT AEPB 1 puff once per day 3 each 3   hydrALAZINE (APRESOLINE) 25 MG tablet Take 1 tablet (25 mg total) by mouth 3 (three) times daily. 270 tablet 3   hydrochlorothiazide (MICROZIDE) 12.5 MG capsule TAKE 1 CAPSULE BY MOUTH EVERY DAY 90 capsule 2   latanoprost (XALATAN) 0.005 % ophthalmic solution Place 1 drop into the left eye at bedtime.     metoprolol succinate (TOPROL-XL) 100 MG 24 hr tablet TAKE 1 TABLET BY MOUTH 2 (TWO) TIMES DAILY. TAKE WITH OR IMMEDIATELY FOLLOWING A MEAL. 180 tablet 0   metoprolol succinate (TOPROL-XL) 25 MG 24 hr tablet TAKE 1 TABLET BY MOUTH IN THE MORNING AND AT BEDTIME. TAKE THIS WITH 100 MG TABLET DAILY. 180 tablet 0   omeprazole (PRILOSEC) 40 MG capsule Take 1 capsule (40 mg total) by mouth daily. 90 capsule 3   oxybutynin (DITROPAN) 5 MG tablet TAKE 1 TABLET (5 MG TOTAL) BY MOUTH DAILY AT 10 PM. 90 tablet 3   rosuvastatin (CRESTOR) 40 MG tablet Take 1 tablet (40 mg total) by mouth daily. 90 tablet 0   telmisartan (MICARDIS) 80 MG tablet Take 80 mg by mouth daily.     traZODone (DESYREL) 100 MG tablet TAKE 1 TABLET BY MOUTH AT BEDTIME AS NEEDED FOR SLEEP 90 tablet 1   potassium chloride SA (KLOR-CON M) 20 MEQ tablet Take 1 tablet (20 mEq total) by mouth 2 (two) times daily for 5 days. 10 tablet 0   No  current facility-administered medications on file prior to visit.        ROS:  All others reviewed and negative.  Objective        PE:  BP 138/78 (BP Location: Left Arm, Patient Position: Sitting, Cuff Size: Normal)   Pulse 62   Temp 98.3 F (36.8 C) (Oral)   Ht 5\' 3"  (  1.6 m)   Wt 189 lb 3.2 oz (85.8 kg)   SpO2 95%   BMI 33.52 kg/m                 Constitutional: Pt appears in NAD               HENT: Head: NCAT.                Right Ear: External ear normal.                 Left Ear: External ear normal. Bilat tm's with mild erythema.  Max sinus areas non tender.  Pharynx with mild erythema, no exudate               Eyes: . Pupils are equal, round, and reactive to light. Conjunctivae and EOM are normal               Nose: without d/c or deformity               Neck: Neck supple. Gross normal ROM               Cardiovascular: Normal rate and regular rhythm.                 Pulmonary/Chest: Effort normal and breath sounds without rales or wheezing.                Abd:  Soft, NT, ND, + BS, no organomegaly               Neurological: Pt is alert. At baseline orientation, motor grossly intact               Skin: Skin is warm. No rashes, no other new lesions, LE edema - none               Psychiatric: Pt behavior is normal without agitation   Micro: none  Cardiac tracings I have personally interpreted today:  none  Pertinent Radiological findings (summarize): none   Lab Results  Component Value Date   WBC 6.8 09/06/2022   HGB 11.7 (L) 09/06/2022   HCT 36.9 09/06/2022   PLT 290.0 09/06/2022   GLUCOSE 101 (H) 02/25/2023   CHOL 119 02/25/2023   TRIG 91.0 02/25/2023   HDL 44.80 02/25/2023   LDLDIRECT 139.1 09/05/2009   LDLCALC 56 02/25/2023   ALT 12 02/25/2023   AST 15 02/25/2023   NA 140 02/25/2023   K 3.5 02/25/2023   CL 105 02/25/2023   CREATININE 1.63 (H) 02/25/2023   BUN 17 02/25/2023   CO2 26 02/25/2023   TSH 1.86 09/06/2022   INR 1.0 09/24/2018   HGBA1C 6.2  02/25/2023   MICROALBUR 2.4 (H) 09/06/2022   Assessment/Plan:  Evaleigh Deese Dodds is a 72 y.o. Black or African American [2] female with  has a past medical history of Alopecia, Anxiety, Aortic atherosclerosis (HCC), Asthma, Eczema, GERD (gastroesophageal reflux disease), Gout, Heart murmur, History of colon polyps, History of syncope (2015), Hyperlipidemia, Hypertension, Hypothyroidism, OA (osteoarthritis) of knee, PVC's (premature ventricular contractions), Toxic multinodular goiter, Type 2 diabetes mellitus (HCC), Ventricular tachycardia, polymorphic (HCC) (01/04/2014), Wears glasses, and Wears partial dentures.  Diabetes Lab Results  Component Value Date   HGBA1C 6.2 02/25/2023   Stable, pt to continue current medical treatment jardiance 25 mg qd   Hyperlipidemia Lab Results  Component Value Date   LDLCALC 56 02/25/2023   Stable, pt to continue current statin crestor 40 qd  Essential hypertension BP Readings from Last 3 Encounters:  02/25/23 138/78  09/06/22 (!) 142/90  08/08/22 (!) 152/82   Stable, pt to continue medical treatment ared cd 240 every day, hdyralazine , hct 12.5 every day, toprol xl 100 bid and 25 bid, micardis 80 qd    Depression with anxiety Stable overall, .declines need for change in tx or counseling at this time  Allergic rhinitis Mild to mod, add allegra 180 every day, to f/u any worsening symptoms or concerns  Asthma Mild uncontrolled, for prednisone taper asd  CKD (chronic kidney disease) stage 3, GFR 30-59 ml/min (HCC) Lab Results  Component Value Date   CREATININE 1.63 (H) 02/25/2023   Stable overall, cont to avoid nephrotoxins   Vitamin D deficiency Last vitamin D Lab Results  Component Value Date   VD25OH 58.79 09/06/2022   Stable, cont oral replacement   Significant closed head trauma within past 3 months With persistent HA - fo MRI brain  Chronic cough For cough med prn, limited use  Followup: Return in about 6 months  (around 08/28/2023).  Oliver Barre, MD 02/26/2023 8:50 PM [facility]

## 2023-02-25 NOTE — Patient Instructions (Addendum)
Please take all new medication as prescribed - the prednisone, allegra, astelin, and cough medicine  Please continue all other medications as before, and refills have been done  - the clonazepam  Please have the pharmacy call with any other refills you may need.  Please continue your efforts at being more active, low cholesterol diet, and weight control  Please keep your appointments with your specialists as you may have planned  You will be contacted regarding the referral for: MRI for the brain  Please go to the LAB at the blood drawing area for the tests to be done  You will be contacted by phone if any changes need to be made immediately.  Otherwise, you will receive a letter about your results with an explanation, but please check with MyChart first.  Please remember to sign up for MyChart if you have not done so, as this will be important to you in the future with finding out test results, communicating by private email, and scheduling acute appointments online when needed.  Please make an Appointment to return in 6 months, or sooner if needed

## 2023-02-26 ENCOUNTER — Encounter: Payer: Self-pay | Admitting: Internal Medicine

## 2023-02-26 NOTE — Assessment & Plan Note (Signed)
BP Readings from Last 3 Encounters:  02/25/23 138/78  09/06/22 (!) 142/90  08/08/22 (!) 152/82   Stable, pt to continue medical treatment ared cd 240 every day, hdyralazine , hct 12.5 every day, toprol xl 100 bid and 25 bid, micardis 80 qd

## 2023-02-26 NOTE — Assessment & Plan Note (Signed)
Mild to mod, add allegra 180 every day, to f/u any worsening symptoms or concerns

## 2023-02-26 NOTE — Assessment & Plan Note (Signed)
Last vitamin D Lab Results  Component Value Date   VD25OH 58.79 09/06/2022   Stable, cont oral replacement

## 2023-02-26 NOTE — Assessment & Plan Note (Signed)
Lab Results  Component Value Date   HGBA1C 6.2 02/25/2023   Stable, pt to continue current medical treatment jardiance 25 mg qd

## 2023-02-26 NOTE — Assessment & Plan Note (Signed)
Stable overall, .declines need for change in tx or counseling at this time

## 2023-02-26 NOTE — Assessment & Plan Note (Signed)
Mild uncontrolled, for prednisone taper asd

## 2023-02-26 NOTE — Assessment & Plan Note (Signed)
Lab Results  Component Value Date   LDLCALC 56 02/25/2023   Stable, pt to continue current statin crestor 40 qd

## 2023-02-26 NOTE — Assessment & Plan Note (Signed)
With persistent HA - fo MRI brain

## 2023-02-26 NOTE — Assessment & Plan Note (Signed)
Lab Results  Component Value Date   CREATININE 1.63 (H) 02/25/2023   Stable overall, cont to avoid nephrotoxins

## 2023-02-26 NOTE — Assessment & Plan Note (Signed)
For cough med prn, limited use

## 2023-03-01 ENCOUNTER — Other Ambulatory Visit: Payer: Medicare Other

## 2023-03-03 DIAGNOSIS — E119 Type 2 diabetes mellitus without complications: Secondary | ICD-10-CM | POA: Diagnosis not present

## 2023-03-21 ENCOUNTER — Encounter: Payer: Self-pay | Admitting: Cardiology

## 2023-03-21 ENCOUNTER — Ambulatory Visit: Payer: Medicare Other | Attending: Cardiology | Admitting: Cardiology

## 2023-03-21 VITALS — BP 130/70 | HR 69 | Ht 63.0 in | Wt 189.4 lb

## 2023-03-21 DIAGNOSIS — E7800 Pure hypercholesterolemia, unspecified: Secondary | ICD-10-CM | POA: Diagnosis not present

## 2023-03-21 DIAGNOSIS — R0789 Other chest pain: Secondary | ICD-10-CM | POA: Diagnosis not present

## 2023-03-21 DIAGNOSIS — I4729 Other ventricular tachycardia: Secondary | ICD-10-CM

## 2023-03-21 DIAGNOSIS — I700 Atherosclerosis of aorta: Secondary | ICD-10-CM

## 2023-03-21 DIAGNOSIS — I10 Essential (primary) hypertension: Secondary | ICD-10-CM | POA: Diagnosis not present
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
## 2023-03-21 NOTE — Patient Instructions (Signed)
Medication Instructions:  Your physician recommends that you continue on your current medications as directed. Please refer to the Current Medication list given to you today.  *If you need a refill on your cardiac medications before your next appointment, please call your pharmacy*   Lab Work: None.  If you have labs (blood work) drawn today and your tests are completely normal, you will receive your results only by: MyChart Message (if you have MyChart) OR A paper copy in the mail If you have any lab test that is abnormal or we need to change your treatment, we will call you to review the results.   Testing/Procedures: None.   Follow-Up: At [facility], you and your health needs are our priority.  As part of our continuing mission to provide you with exceptional heart care, we have created designated Provider Care Teams.  These Care Teams include your primary Cardiologist (physician) and Advanced Practice Providers (APPs -  Physician Assistants and Nurse Practitioners) who all work together to provide you with the care you need, when you need it.  We recommend signing up for the patient portal called "MyChart".  Sign up information is provided on this After Visit Summary.  MyChart is used to connect with patients for Virtual Visits (Telemedicine).  Patients are able to view lab/test results, encounter notes, upcoming appointments, etc.  Non-urgent messages can be sent to your provider as well.   To learn more about what you can do with MyChart, go to [email or web address].    Your next appointment:   1 year(s)  Provider:   Traci Turner, MD     

## 2023-03-22 ENCOUNTER — Other Ambulatory Visit: Payer: Self-pay | Admitting: Internal Medicine

## 2023-03-22 ENCOUNTER — Ambulatory Visit
Admission: RE | Admit: 2023-03-22 | Discharge: 2023-03-22 | Disposition: A | Discharge status: Home / Self Care | Payer: Medicare Other | Source: Ambulatory Visit | Attending: Internal Medicine | Admitting: Internal Medicine

## 2023-03-22 DIAGNOSIS — R519 Headache, unspecified: Secondary | ICD-10-CM | POA: Diagnosis not present

## 2023-03-22 DIAGNOSIS — M1000 Idiopathic gout, unspecified site: Secondary | ICD-10-CM

## 2023-03-22 DIAGNOSIS — Z87820 Personal history of traumatic brain injury: Secondary | ICD-10-CM

## 2023-03-29 ENCOUNTER — Telehealth: Payer: Self-pay | Admitting: Internal Medicine

## 2023-03-29 NOTE — Telephone Encounter (Signed)
Called and let Pt know MRI results haven't been resulted yet due to Dr.John being out of the office, will call back once we the results.

## 2023-03-29 NOTE — Telephone Encounter (Signed)
Sorry I cannot give the results as the aug 30 MRI brain results are not yet resulted on the EPIC  This is unusual, and likely has some kind of administrative error involved.   Please contact [facility] to let them know than we have yet to receive the results.  Thanks

## 2023-03-29 NOTE — Telephone Encounter (Signed)
Pt called wanting results from her MRI ask pt did she ask imaging directly and pt stated that she had to speak with her PCP. Please advise.

## 2023-04-03 ENCOUNTER — Other Ambulatory Visit: Payer: Self-pay | Admitting: Cardiology

## 2023-04-03 ENCOUNTER — Other Ambulatory Visit: Payer: Self-pay | Admitting: Internal Medicine

## 2023-04-03 DIAGNOSIS — M1000 Idiopathic gout, unspecified site: Secondary | ICD-10-CM

## 2023-04-03 NOTE — Telephone Encounter (Signed)
Pt has called and asked that she be contacted about her MRI results as soon as they are available please as she is concerned.

## 2023-04-04 NOTE — Telephone Encounter (Signed)
Ok  - staff to contact imaging location to ask for results please

## 2023-04-05 NOTE — Telephone Encounter (Signed)
Tried calling Imaging and got disconnected will be calling back.

## 2023-04-08 DIAGNOSIS — H4052X3 Glaucoma secondary to other eye disorders, left eye, severe stage: Secondary | ICD-10-CM | POA: Diagnosis not present

## 2023-04-12 NOTE — Telephone Encounter (Signed)
MRI is negative for any acute problem, ok to continue current tx

## 2023-04-15 NOTE — Telephone Encounter (Signed)
Called and let Pt know

## 2023-05-01 ENCOUNTER — Other Ambulatory Visit: Payer: Self-pay | Admitting: Internal Medicine

## 2023-05-13 DIAGNOSIS — H4052X3 Glaucoma secondary to other eye disorders, left eye, severe stage: Secondary | ICD-10-CM | POA: Diagnosis not present

## 2023-05-15 DIAGNOSIS — D631 Anemia in chronic kidney disease: Secondary | ICD-10-CM | POA: Diagnosis not present

## 2023-05-15 DIAGNOSIS — E1122 Type 2 diabetes mellitus with diabetic chronic kidney disease: Secondary | ICD-10-CM | POA: Diagnosis not present

## 2023-05-15 DIAGNOSIS — R809 Proteinuria, unspecified: Secondary | ICD-10-CM | POA: Diagnosis not present

## 2023-05-15 DIAGNOSIS — N2581 Secondary hyperparathyroidism of renal origin: Secondary | ICD-10-CM | POA: Diagnosis not present

## 2023-05-15 DIAGNOSIS — N1832 Chronic kidney disease, stage 3b: Secondary | ICD-10-CM | POA: Diagnosis not present

## 2023-05-15 DIAGNOSIS — N189 Chronic kidney disease, unspecified: Secondary | ICD-10-CM | POA: Diagnosis not present

## 2023-05-15 DIAGNOSIS — I129 Hypertensive chronic kidney disease with stage 1 through stage 4 chronic kidney disease, or unspecified chronic kidney disease: Secondary | ICD-10-CM | POA: Diagnosis not present

## 2023-05-16 LAB — LAB REPORT - SCANNED
Creatinine, POC: 71.5 mg/dL
EGFR: 32

## 2023-05-28 ENCOUNTER — Other Ambulatory Visit: Payer: Self-pay | Admitting: Internal Medicine

## 2023-05-28 ENCOUNTER — Other Ambulatory Visit: Payer: Self-pay

## 2023-05-29 ENCOUNTER — Other Ambulatory Visit (HOSPITAL_COMMUNITY): Payer: Self-pay | Admitting: *Deleted

## 2023-05-30 ENCOUNTER — Encounter (HOSPITAL_COMMUNITY): Payer: Medicare Other

## 2023-06-03 ENCOUNTER — Encounter (HOSPITAL_COMMUNITY)
Admission: RE | Admit: 2023-06-03 | Discharge: 2023-06-03 | Disposition: A | Discharge status: Home / Self Care | Payer: Medicare Other | Source: Ambulatory Visit | Attending: Nephrology | Admitting: Nephrology

## 2023-06-03 DIAGNOSIS — D631 Anemia in chronic kidney disease: Secondary | ICD-10-CM | POA: Diagnosis not present

## 2023-06-03 DIAGNOSIS — N189 Chronic kidney disease, unspecified: Secondary | ICD-10-CM | POA: Diagnosis not present

## 2023-06-03 MED ORDER — SODIUM CHLORIDE 0.9 % IV SOLN
510.0000 mg | INTRAVENOUS | Status: DC
  Administered 2023-06-03: 510 mg via INTRAVENOUS
  Filled 2023-06-03: qty 17

## 2023-06-06 ENCOUNTER — Ambulatory Visit (INDEPENDENT_AMBULATORY_CARE_PROVIDER_SITE_OTHER): Payer: Medicare Other

## 2023-06-06 ENCOUNTER — Ambulatory Visit (INDEPENDENT_AMBULATORY_CARE_PROVIDER_SITE_OTHER): Payer: Medicare Other | Admitting: Internal Medicine

## 2023-06-06 ENCOUNTER — Encounter (HOSPITAL_COMMUNITY): Payer: Medicare Other

## 2023-06-06 ENCOUNTER — Encounter: Payer: Self-pay | Admitting: Internal Medicine

## 2023-06-06 VITALS — BP 118/64 | HR 70 | Temp 98.1°F | Ht 63.0 in | Wt 185.0 lb

## 2023-06-06 DIAGNOSIS — J309 Allergic rhinitis, unspecified: Secondary | ICD-10-CM | POA: Diagnosis not present

## 2023-06-06 DIAGNOSIS — H6122 Impacted cerumen, left ear: Secondary | ICD-10-CM | POA: Diagnosis not present

## 2023-06-06 DIAGNOSIS — R918 Other nonspecific abnormal finding of lung field: Secondary | ICD-10-CM | POA: Diagnosis not present

## 2023-06-06 DIAGNOSIS — R059 Cough, unspecified: Secondary | ICD-10-CM

## 2023-06-06 DIAGNOSIS — R519 Headache, unspecified: Secondary | ICD-10-CM

## 2023-06-06 DIAGNOSIS — I10 Essential (primary) hypertension: Secondary | ICD-10-CM

## 2023-06-06 DIAGNOSIS — E559 Vitamin D deficiency, unspecified: Secondary | ICD-10-CM

## 2023-06-06 DIAGNOSIS — J4521 Mild intermittent asthma with (acute) exacerbation: Secondary | ICD-10-CM | POA: Diagnosis not present

## 2023-06-06 DIAGNOSIS — R053 Chronic cough: Secondary | ICD-10-CM | POA: Diagnosis not present

## 2023-06-06 MED ORDER — TRELEGY ELLIPTA 100-62.5-25 MCG/ACT IN AEPB: INHALATION_SPRAY | RESPIRATORY_TRACT | 3 refills | Status: DC

## 2023-06-06 MED ORDER — METHYLPREDNISOLONE ACETATE 80 MG/ML IJ SUSP
80.0000 mg | Freq: Once | INTRAMUSCULAR | Status: AC
Start: 2023-06-06 — End: 2023-06-06
  Administered 2023-06-06: 80 mg via INTRAMUSCULAR

## 2023-06-06 MED ORDER — ALBUTEROL SULFATE HFA 108 (90 BASE) MCG/ACT IN AERS: 2.0000 | INHALATION_SPRAY | Freq: Four times a day (QID) | RESPIRATORY_TRACT | 11 refills | Status: DC | PRN

## 2023-06-06 NOTE — Patient Instructions (Addendum)
Please to try the OTC Voltaren gel or the [place] type pain patch for the scalp pain at bedtime as needed  Your Left ear was irrigated today  You had the steroid shot today  Please take all new medication as prescribed - the albuterol  Please continue all other medications as before, including the Trelegy inhaler  Please have the pharmacy call with any other refills you may need.  Please continue your efforts at being more active, low cholesterol diet, and weight control.  Please keep your appointments with your specialists as you may have planned - the iron infusion next week  Please go to the XRAY Department in the first floor for the x-ray testing  You will be contacted by phone if any changes need to be made immediately.  Otherwise, you will receive a letter about your results with an explanation, but please check with MyChart first.  Please make an Appointment to return in 3 months, or sooner if needed

## 2023-06-06 NOTE — Progress Notes (Signed)
Patient ID: Brooke [name], female   DOB: [birth year], 72 y.o.   MRN: [id number]        Chief Complaint: follow up scalp pain, left ear wax impaction, allergies, copd , low vit d, htn       HPI:  Brooke [name] is a 72 y.o. female here with c/o persistent pain for 2 wks to the scalp burning tender to left scalp near crown approx 3 x3 cm area, without overt skin change, redness or swelling or trauma.  Has bilateral ear wax impactions with muffled hearing. .  Pt denies chest pain,  wheezing, orthopnea, PND, increased LE swelling, palpitations, dizziness or syncope.   Pt denies polydipsia, polyuria, or new focal neuro s/s.    Pt denies fever, wt loss, night sweats, loss of appetite, or other constitutional symptoms  Does have several wks ongoing nasal allergy symptoms with clearish congestion, itch and sneezing, without fever, pain, ST, cough, swelling or wheezing but does have mild sob with exertion at times, does not have an inhaler. Has iron infusion scheduled for next wk       Wt Readings from Last 3 Encounters:  06/06/23 185 lb (83.9 kg)  06/03/23 189 lb (85.7 kg)  03/21/23 189 lb 6.4 oz (85.9 kg)   BP Readings from Last 3 Encounters:  06/06/23 118/64  06/03/23 123/68  03/21/23 130/70         Past Medical History:  Diagnosis Date   Alopecia    Anxiety    Aortic atherosclerosis (HCC)    Asthma    FOLLOWED BY PCP   Eczema    GERD (gastroesophageal reflux disease)    Gout    04-28-2018--- per pt stable , as been a while since last episode   Heart murmur    History of colon polyps    History of syncope 2015   Hyperlipidemia    Hypertension    Hypothyroidism    OA (osteoarthritis) of knee    bilateral   PVC's (premature ventricular contractions)    Toxic multinodular goiter    03/ 2003  s/p RAI   Type 2 diabetes mellitus (HCC)    followed by pcp   Ventricular tachycardia, polymorphic (HCC) 01/04/2014   primary cardiologist-- dr Gloris [place] turner (hx monitor 2015 showed couplet PVCs,  as trigger)   Wears glasses    Wears partial dentures    upper   Past Surgical History:  Procedure Laterality Date   ABDOMINAL HYSTERECTOMY  10/22/1999   WITH BSO   CATARACT EXTRACTION W/ INTRAOCULAR LENS IMPLANT Left YRS AGO   COLONOSCOPY     EXCISION ABDOMINAL WALL MASS  12-20-2005   dr Jamey Ripa @[facility]    neruofibroma   LAPAROSCOPIC CHOLECYSTECTOMY  10/01/2002   dr Jamey Ripa @[facility]    LEFT HEART CATHETERIZATION WITH CORONARY ANGIOGRAM N/A 01/07/2014   Procedure: LEFT HEART CATHETERIZATION WITH CORONARY ANGIOGRAM;  Surgeon: Peter M Swaziland, MD;  Location: [facility] CATH LAB;  Service: Cardiovascular;  Laterality: N/A;   RASTELLI PROCEDURE  6/98 neg   RENAL ARTERY STENT Left 11/2003   angioplasty and stenting   RENAL ARTERY STENT Left 02/2005   re-stenting   TOTAL KNEE ARTHROPLASTY Right 05/05/2018   Procedure: RIGHT TOTAL KNEE ARTHROPLASTY;  Surgeon: Gean Birchwood, MD;  Location: [facility];  Service: Orthopedics;  Laterality: Right;   TOTAL KNEE ARTHROPLASTY Left 09/30/2018   Procedure: TOTAL KNEE ARTHROPLASTY;  Surgeon: Marcene Corning, MD;  Location: [facility];  Service: Orthopedics;  Laterality: Left;    reports that  she has never smoked. She has never used smokeless tobacco. She reports that she does not drink alcohol and does not use drugs. family history includes Diabetes in her mother; Heart disease in her father. Allergies  Allergen Reactions   Metformin And Related Nausea And Vomiting   Ace Inhibitors Swelling        Codeine Nausea And Vomiting and Rash   Hydrocodone Nausea And Vomiting   Tramadol Nausea And Vomiting   Tylenol [Acetaminophen] Itching and Rash   Current Outpatient Medications on File Prior to Visit  Medication Sig Dispense Refill   acetaminophen (TYLENOL) 325 MG tablet Take 650 mg by mouth every 6 (six) hours as needed for mild pain.     acetaZOLAMIDE (DIAMOX) 250 MG tablet Take by mouth.     allopurinol (ZYLOPRIM) 100 MG tablet TAKE 1 TABLET BY MOUTH EVERY DAY 90 tablet 2    azelastine (ASTELIN) 0.1 % nasal spray Place 2 sprays into both nostrils 2 (two) times daily. Use in each nostril as directed 30 mL 12   brimonidine (ALPHAGAN) 0.2 % ophthalmic solution 3 (three) times daily. 1 drop in the left eye three times daily     citalopram (CELEXA) 40 MG tablet TAKE 1 TABLET BY MOUTH EVERY DAY 90 tablet 3   clonazePAM (KLONOPIN) 0.5 MG tablet Take 1 tablet (0.5 mg total) by mouth daily as needed (vertigo). 90 tablet 1   colchicine 0.6 MG tablet TAKE 0.5 TABLETS (0.3 MG TOTAL) BY MOUTH DAILY AS NEEDED (GOUT OR PSUEDOGOUT PAIN). 45 tablet 1   diltiazem (CARDIZEM CD) 240 MG 24 hr capsule TAKE 1 CAPSULE BY MOUTH EVERY DAY 90 capsule 3   dorzolamide (TRUSOPT) 2 % ophthalmic solution Place 1 drop into the left eye 3 (three) times daily.     empagliflozin (JARDIANCE) 25 MG TABS tablet TAKE 1 TABLET BY MOUTH DAILY BEFORE BREAKFAST. 90 tablet 3   fexofenadine (ALLEGRA) 180 MG tablet Take 1 tablet (180 mg total) by mouth daily. 90 tablet 3   hydrALAZINE (APRESOLINE) 25 MG tablet Take 1 tablet (25 mg total) by mouth 3 (three) times daily. 270 tablet 3   hydrochlorothiazide (MICROZIDE) 12.5 MG capsule TAKE 1 CAPSULE BY MOUTH EVERY DAY 90 capsule 2   latanoprost (XALATAN) 0.005 % ophthalmic solution Place 1 drop into the left eye at bedtime.     meclizine (ANTIVERT) 25 MG tablet TAKE 1 TABLET BY MOUTH 3 TIMES DAILY AS NEEDED FOR DIZZINESS. 90 tablet 1   metoprolol succinate (TOPROL-XL) 100 MG 24 hr tablet TAKE 1 TABLET BY MOUTH 2 (TWO) TIMES DAILY. TAKE WITH OR IMMEDIATELY FOLLOWING A MEAL. 180 tablet 0   metoprolol succinate (TOPROL-XL) 25 MG 24 hr tablet TAKE 1 TABLET BY MOUTH IN THE MORNING AND AT BEDTIME. TAKE THIS WITH 100 MG TABLET DAILY. 180 tablet 0   omeprazole (PRILOSEC) 40 MG capsule Take 1 capsule (40 mg total) by mouth daily. 90 capsule 3   oxybutynin (DITROPAN) 5 MG tablet TAKE 1 TABLET (5 MG TOTAL) BY MOUTH DAILY AT 10 PM. 90 tablet 3   predniSONE (DELTASONE) 10 MG tablet  3 tabs by mouth per day for 3 days,2tabs per day for 3 days,1tab per day for 3 days 18 tablet 0   rosuvastatin (CRESTOR) 40 MG tablet TAKE 1 TABLET BY MOUTH EVERY DAY 90 tablet 3   telmisartan (MICARDIS) 80 MG tablet Take 80 mg by mouth daily.     traZODone (DESYREL) 100 MG tablet TAKE 1 TABLET BY MOUTH AT  BEDTIME AS NEEDED FOR SLEEP 90 tablet 1   potassium chloride SA (KLOR-CON M) 20 MEQ tablet Take 1 tablet (20 mEq total) by mouth 2 (two) times daily for 5 days. 10 tablet 0   No current facility-administered medications on file prior to visit.        ROS:  All others reviewed and negative.  Objective        PE:  BP 118/64 (BP Location: Right Arm, Patient Position: Sitting, Cuff Size: Normal)   Pulse 70   Temp 98.1 F (36.7 C) (Oral)   Ht 5\' 3"  (1.6 m)   Wt 185 lb (83.9 kg)   SpO2 98%   BMI 32.77 kg/m                 Constitutional: Pt appears in NAD               HENT: Head: NCAT.                Right Ear: External ear normal.                 Left Ear: External ear normal.                Eyes: . Pupils are equal, round, and reactive to light. Conjunctivae and EOM are normal               Nose: without d/c or deformity               Neck: Neck supple. Gross normal ROM               Cardiovascular: Normal rate and regular rhythm.                 Pulmonary/Chest: Effort normal and breath sounds without rales or wheezing.                Abd:  Soft, NT, ND, + BS, no organomegaly               Neurological: Pt is alert. At baseline orientation, motor grossly intact               Skin: Skin is warm. No rashes, no other new lesions - crown/scalp with no skin change or swelling, LE edema - none;                Psychiatric: Pt behavior is normal without agitation   Micro: none  Cardiac tracings I have personally interpreted today:  none  Pertinent Radiological findings (summarize): none   Lab Results  Component Value Date   WBC 6.8 09/06/2022   HGB 11.7 (L) 09/06/2022   HCT 36.9  09/06/2022   PLT 290.0 09/06/2022   GLUCOSE 101 (H) 02/25/2023   CHOL 119 02/25/2023   TRIG 91.0 02/25/2023   HDL 44.80 02/25/2023   LDLDIRECT 139.1 09/05/2009   LDLCALC 56 02/25/2023   ALT 12 02/25/2023   AST 15 02/25/2023   NA 140 02/25/2023   K 3.5 02/25/2023   CL 105 02/25/2023   CREATININE 1.63 (H) 02/25/2023   BUN 17 02/25/2023   CO2 26 02/25/2023   TSH 1.86 09/06/2022   INR 1.0 09/24/2018   HGBA1C 6.2 02/25/2023   MICROALBUR 2.4 (H) 09/06/2022   Assessment/Plan:  Brooke [name] is a 72 y.o. Black or African American [2] female with  has a past medical history of Alopecia, Anxiety, Aortic atherosclerosis (HCC), Asthma, Eczema, GERD (gastroesophageal reflux disease), Gout, Heart  murmur, History of colon polyps, History of syncope (2015), Hyperlipidemia, Hypertension, Hypothyroidism, OA (osteoarthritis) of knee, PVC's (premature ventricular contractions), Toxic multinodular goiter, Type 2 diabetes mellitus (HCC), Ventricular tachycardia, polymorphic (HCC) (01/04/2014), Wears glasses, and Wears partial dentures.  Vitamin D deficiency Last vitamin D Lab Results  Component Value Date   VD25OH 58.79 09/06/2022   Stable, cont oral replacement   Essential hypertension BP Readings from Last 3 Encounters:  06/06/23 118/64  06/03/23 123/68  03/21/23 130/70   Stable, pt to continue medical treatment card cd 240 every day, hydralazine 25 tid, hct 12.5 every day, toprol xl 125. bid   Asthma Mild intermittent, for albuterol hfa prn  Allergic rhinitis Mild to mod, for depomedrol 80 mg IM,  to f/u any worsening symptoms or concerns  Left ear impacted cerumen Resolved,  to f/u any worsening symptoms or concerns  Scalp pain Exam benign, ok for topical volt gel and /or salon paz prn  Followup: Return in about 3 months (around 09/06/2023).  Oliver Barre, MD 06/08/2023 7:00 PM [facility]

## 2023-06-08 ENCOUNTER — Encounter: Payer: Self-pay | Admitting: Internal Medicine

## 2023-06-08 DIAGNOSIS — R519 Headache, unspecified: Secondary | ICD-10-CM | POA: Insufficient documentation

## 2023-06-08 NOTE — Assessment & Plan Note (Signed)
Exam benign, ok for topical volt gel and /or salon paz prn

## 2023-06-08 NOTE — Assessment & Plan Note (Signed)
Last vitamin D Lab Results  Component Value Date   VD25OH 58.79 09/06/2022   Stable, cont oral replacement

## 2023-06-08 NOTE — Assessment & Plan Note (Signed)
BP Readings from Last 3 Encounters:  06/06/23 118/64  06/03/23 123/68  03/21/23 130/70   Stable, pt to continue medical treatment card cd 240 every day, hydralazine 25 tid, hct 12.5 every day, toprol xl 125. bid

## 2023-06-08 NOTE — Assessment & Plan Note (Signed)
Resolved,  to f/u any worsening symptoms or concerns  

## 2023-06-08 NOTE — Assessment & Plan Note (Signed)
Mild intermittent, for albuterol hfa prn

## 2023-06-08 NOTE — Assessment & Plan Note (Signed)
Mild to mod, for depomedrol 80 mg IM, to f/u any worsening symptoms or concerns 

## 2023-06-10 ENCOUNTER — Encounter (HOSPITAL_COMMUNITY)
Admission: RE | Admit: 2023-06-10 | Discharge: 2023-06-10 | Disposition: A | Discharge status: Home / Self Care | Payer: Medicare Other | Source: Ambulatory Visit | Attending: Nephrology

## 2023-06-10 DIAGNOSIS — D631 Anemia in chronic kidney disease: Secondary | ICD-10-CM | POA: Diagnosis not present

## 2023-06-10 DIAGNOSIS — N189 Chronic kidney disease, unspecified: Secondary | ICD-10-CM | POA: Diagnosis not present

## 2023-06-10 MED ORDER — SODIUM CHLORIDE 0.9 % IV SOLN
510.0000 mg | INTRAVENOUS | Status: AC
  Administered 2023-06-10: 510 mg via INTRAVENOUS
  Filled 2023-06-10: qty 510

## 2023-06-24 ENCOUNTER — Encounter: Payer: Self-pay | Admitting: Internal Medicine

## 2023-06-28 ENCOUNTER — Other Ambulatory Visit: Payer: Self-pay

## 2023-06-28 ENCOUNTER — Encounter (HOSPITAL_COMMUNITY): Payer: Self-pay

## 2023-06-28 ENCOUNTER — Emergency Department (HOSPITAL_COMMUNITY)
Admission: EM | Admit: 2023-06-28 | Discharge: 2023-06-28 | Disposition: A | Discharge status: Home / Self Care | Payer: Medicare Other | Attending: Emergency Medicine | Admitting: Emergency Medicine

## 2023-06-28 DIAGNOSIS — R42 Dizziness and giddiness: Secondary | ICD-10-CM | POA: Diagnosis not present

## 2023-06-28 DIAGNOSIS — R519 Headache, unspecified: Secondary | ICD-10-CM | POA: Diagnosis not present

## 2023-06-28 NOTE — Discharge Instructions (Addendum)
It was a pleasure taking care of you this afternoon.  You were evaluated in the emergency room for head pain and dizziness.  Upon reviewing your MRI and labs outpatient.  There does not appear to be any significant abnormality.  I would recommend following up with your PCP this week and discuss then if there are any specialist you should see.  If you experience any new or worsening symptoms including significant change in dizziness, pain, blurry vision or difficulty balancing please return to the emergency room.

## 2023-06-28 NOTE — ED Triage Notes (Signed)
C/o pain to left axillary that woke her from sleep. Patient denies pain with ROM.  Tylenol w/ relief.  Patient also reports headache x 3 months with negative MRI

## 2023-06-28 NOTE — ED Provider Notes (Signed)
EMERGENCY DEPARTMENT AT [facility] Provider Note   CSN: [id number] Arrival date & time: 06/28/23  1112     History  Chief Complaint  Patient presents with   Extremity Weakness    Brooke [name] is a 72 y.o. female who presents with complaints of parietal head pain, dizziness and extremity weakness for the past 5 months.  She denies any injury or trauma.  No pruritus.  No blurry vision.  She has been following with her primary care physician who recommended applying Voltaren gel to her head.  She does have a history of vertigo, however she states the symptoms feel different.    Extremity Weakness       Home Medications Prior to Admission medications   Medication Sig Start Date End Date Taking? Authorizing Provider  acetaminophen (TYLENOL) 325 MG tablet Take 650 mg by mouth every 6 (six) hours as needed for mild pain.    Corwin Levins, MD  acetaZOLAMIDE (DIAMOX) 250 MG tablet Take by mouth. 08/21/22   [provider]  albuterol (VENTOLIN HFA) 108 (90 Base) MCG/ACT inhaler Inhale 2 puffs into the lungs every 6 (six) hours as needed for wheezing or shortness of breath. 06/06/23   Corwin Levins, MD  allopurinol (ZYLOPRIM) 100 MG tablet TAKE 1 TABLET BY MOUTH EVERY DAY 08/16/22   Corwin Levins, MD  azelastine (ASTELIN) 0.1 % nasal spray Place 2 sprays into both nostrils 2 (two) times daily. Use in each nostril as directed 02/25/23   Corwin Levins, MD  brimonidine [facility]) 0.2 % ophthalmic solution 3 (three) times daily. 1 drop in the left eye three times daily    [provider]  citalopram (CELEXA) 40 MG tablet TAKE 1 TABLET BY MOUTH EVERY DAY 05/01/23   Corwin Levins, MD  clonazePAM (KLONOPIN) 0.5 MG tablet Take 1 tablet (0.5 mg total) by mouth daily as needed (vertigo). 02/25/23   Corwin Levins, MD  colchicine 0.6 MG tablet TAKE 0.5 TABLETS (0.3 MG TOTAL) BY MOUTH DAILY AS NEEDED (GOUT OR PSUEDOGOUT PAIN). 10/01/22   Rodolph Bong, MD  diltiazem  (CARDIZEM CD) 240 MG 24 hr capsule TAKE 1 CAPSULE BY MOUTH EVERY DAY 05/01/23   Corwin Levins, MD  dorzolamide (TRUSOPT) 2 % ophthalmic solution Place 1 drop into the left eye 3 (three) times daily.    [provider]  empagliflozin (JARDIANCE) 25 MG TABS tablet TAKE 1 TABLET BY MOUTH DAILY BEFORE BREAKFAST. 10/08/22   Corwin Levins, MD  fexofenadine (ALLEGRA) 180 MG tablet Take 1 tablet (180 mg total) by mouth daily. 02/25/23   Corwin Levins, MD  Fluticasone-Umeclidin-[facility] ELLIPTA) 100-62.5-25 MCG/ACT AEPB USE 1 PUFF ONCE PER DAY 06/06/23   Corwin Levins, MD  hydrALAZINE (APRESOLINE) 25 MG tablet Take 1 tablet (25 mg total) by mouth 3 (three) times daily. 09/06/22   Corwin Levins, MD  hydrochlorothiazide (MICROZIDE) 12.5 MG capsule TAKE 1 CAPSULE BY MOUTH EVERY DAY 10/29/22   Corwin Levins, MD  latanoprost (XALATAN) 0.005 % ophthalmic solution Place 1 drop into the left eye at bedtime.    [provider]  meclizine (ANTIVERT) 25 MG tablet TAKE 1 TABLET BY MOUTH 3 TIMES DAILY AS NEEDED FOR DIZZINESS. 02/25/23   Corwin Levins, MD  metoprolol succinate (TOPROL-XL) 100 MG 24 hr tablet TAKE 1 TABLET BY MOUTH 2 (TWO) TIMES DAILY. TAKE WITH OR IMMEDIATELY FOLLOWING A MEAL. 01/23/23   Quintella Reichert, MD  metoprolol succinate (TOPROL-XL) 25 MG 24 hr tablet TAKE 1 TABLET BY MOUTH IN THE MORNING AND AT BEDTIME. TAKE THIS WITH 100 MG TABLET DAILY. 01/23/23   Quintella Reichert, MD  omeprazole (PRILOSEC) 40 MG capsule Take 1 capsule (40 mg total) by mouth daily. 12/05/21   Corwin Levins, MD  oxybutynin (DITROPAN) 5 MG tablet TAKE 1 TABLET (5 MG TOTAL) BY MOUTH DAILY AT 10 PM. 02/05/22   Corwin Levins, MD  potassium chloride SA (KLOR-CON M) 20 MEQ tablet Take 1 tablet (20 mEq total) by mouth 2 (two) times daily for 5 days. 04/26/22 09/05/22  Linwood Dibbles, MD  predniSONE (DELTASONE) 10 MG tablet 3 tabs by mouth per day for 3 days,2tabs per day for 3 days,1tab per day for 3 days 02/25/23   Corwin Levins, MD   rosuvastatin (CRESTOR) 40 MG tablet TAKE 1 TABLET BY MOUTH EVERY DAY 04/03/23   Quintella Reichert, MD  telmisartan (MICARDIS) 80 MG tablet Take 80 mg by mouth daily. 09/13/21   [provider]  traZODone (DESYREL) 100 MG tablet TAKE 1 TABLET BY MOUTH AT BEDTIME AS NEEDED FOR SLEEP 01/23/23   Crain, Whitney L, PA      Allergies    Metformin and related, Ace inhibitors, Codeine, Hydrocodone, Tramadol, and Tylenol [acetaminophen]    Review of Systems   Review of Systems  Musculoskeletal:  Positive for extremity weakness.    Physical Exam Updated Vital Signs BP (!) 150/73 (BP Location: Right Arm)   Pulse 63   Temp 97.9 F (36.6 C) (Oral)   Resp 18   Wt 83 kg   SpO2 96%   BMI 32.41 kg/m  Physical Exam  ED Results / Procedures / Treatments   Labs (all labs ordered are listed, but only abnormal results are displayed) Labs Reviewed - No data to display  EKG None  Radiology No results found.  Procedures Procedures    Medications Ordered in ED Medications - No data to display  ED Course/ Medical Decision Making/ A&P                                 Medical Decision Making  This patient presents to the ED with chief complaint(s) of scalp pain and dizziness.  The complaint involves an extensive differential diagnosis and also carries with it a high risk of complications and morbidity.   pertinent past medical history as listed in HPI  The differential diagnosis includes  CVA, mass affect, vertigo, head trauma, scabies, shingles The initial plan is to  Review outpatient labs and imaging Additional history obtained: No additional historians Records reviewed Care Everywhere/External Records and Primary Care Documents  Initial Assessment:   Patient is hemodynamically stable, afebrile.  Completely nonfocal neuroexam.  Ambulates without difficulty.  She does have very localized point of tenderness at the vertex of her scalp.  There is no induration, crepitus, step-off.   No evidence of infection or trauma to the area.  She endorses weakness, however she has good strength bilaterally.  Overall, do not feel the need to repeat labs or imaging imaging at this time as they were recently without significant abnormality.  Feel that patient would be best managed outpatient.  Referred to follow-up with her PCP.  Independent ECG interpretation:  Reviewed recent outpatient EKG, no ischemic changes  Independent labs interpretation:  The following labs were independently interpreted:  Reviewed outpatient labs, unremarkable  Independent  visualization and interpretation of imaging: I independently visualized the following imaging with scope of interpretation limited to determining acute life threatening conditions related to emergency care: Reviewed outpatient brain MRI, which revealed no significant abnormality  Treatment and Reassessment: No medications administered during visit  Consultations obtained:   None  Disposition:   Patient will be discharged home with close PCP follow-up. The patient has been appropriately medically screened and/or stabilized in the ED. I have low suspicion for any other emergent medical condition which would require further screening, evaluation or treatment in the ED or require inpatient management. At time of discharge the patient is hemodynamically stable and in no acute distress. I have discussed work-up results and diagnosis with patient and answered all questions. Patient is agreeable with discharge plan. We discussed strict return precautions for returning to the emergency department and they verbalized understanding.     Social Determinants of Health:   None  This note was dictated with voice recognition software.  Despite best efforts at proofreading, errors may have occurred which can change the documentation meaning.          Final Clinical Impression(s) / ED Diagnoses Final diagnoses:  Scalp pain  Dizziness    Rx /  DC Orders ED Discharge Orders     None         Halford Decamp, PA-C 06/28/23 1425    Derwood Kaplan, MD 06/30/23 [phone]

## 2023-07-01 ENCOUNTER — Telehealth: Payer: Self-pay

## 2023-07-01 NOTE — Transitions of Care (Post Inpatient/ED Visit) (Signed)
07/01/2023  Name: Brooke [name] MRN: [id number] DOB: 12/28/1950  Today's TOC FU Call Status: Today's TOC FU Call Status:: Successful TOC FU Call Completed TOC FU Call Complete Date: 07/01/23 Patient's Name and Date of Birth confirmed.  Transition Care Management Follow-up Telephone Call Date of Discharge: 06/28/23 Discharge Facility: [facility]) Type of Discharge: Emergency Department Reason for ED Visit: Other: (headache) How have you been since you were released from the hospital?: Better Any questions or concerns?: No  Items Reviewed: Did you receive and understand the discharge instructions provided?: Yes Medications obtained,verified, and reconciled?: Yes (Medications Reviewed) Any new allergies since your discharge?: No Dietary orders reviewed?: Yes Do you have support at home?: Yes People in Home: child(ren), adult  Medications Reviewed Today: Medications Reviewed Today     Reviewed by Karena Addison, LPN (Licensed Practical Nurse) on 07/01/23 at 1647  Med List Status: <None>   Medication Order Taking? Sig Documenting Provider Last Dose Status Informant  acetaminophen (TYLENOL) 325 MG tablet [id number] No Take 650 mg by mouth every 6 (six) hours as needed for mild pain. Corwin Levins, MD Taking Active Self  acetaZOLAMIDE (DIAMOX) 250 MG tablet [id number] No Take by mouth. [provider] Taking Active   albuterol (VENTOLIN HFA) 108 (90 Base) MCG/ACT inhaler [id number]  Inhale 2 puffs into the lungs every 6 (six) hours as needed for wheezing or shortness of breath. Corwin Levins, MD  Active   allopurinol (ZYLOPRIM) 100 MG tablet [id number] No TAKE 1 TABLET BY MOUTH EVERY DAY Corwin Levins, MD Taking Active   azelastine (ASTELIN) 0.1 % nasal spray [id number] No Place 2 sprays into both nostrils 2 (two) times daily. Use in each nostril as directed Corwin Levins, MD Taking Active   brimonidine [facility]) 0.2 % ophthalmic solution [id number] No 3 (three) times  daily. 1 drop in the left eye three times daily [provider] Taking Active   citalopram (CELEXA) 40 MG tablet [id number] No TAKE 1 TABLET BY MOUTH EVERY DAY Corwin Levins, MD Taking Active   clonazePAM (KLONOPIN) 0.5 MG tablet [id number] No Take 1 tablet (0.5 mg total) by mouth daily as needed (vertigo). Corwin Levins, MD Taking Active   colchicine 0.6 MG tablet [id number] No TAKE 0.5 TABLETS (0.3 MG TOTAL) BY MOUTH DAILY AS NEEDED (GOUT OR PSUEDOGOUT PAIN). Rodolph Bong, MD Taking Active   diltiazem [facility] CD) 240 MG 24 hr capsule [id number] No TAKE 1 CAPSULE BY MOUTH EVERY DAY Corwin Levins, MD Taking Active   dorzolamide (TRUSOPT) 2 % ophthalmic solution [id number] No Place 1 drop into the left eye 3 (three) times daily. [provider] Taking Active   empagliflozin (JARDIANCE) 25 MG TABS tablet [id number] No TAKE 1 TABLET BY MOUTH DAILY BEFORE BREAKFAST. Corwin Levins, MD Taking Active   fexofenadine [facility]) 180 MG tablet [id number] No Take 1 tablet (180 mg total) by mouth daily. Corwin Levins, MD Taking Active   Fluticasone-Umeclidin-Vilant [facility] ELLIPTA) 100-62.5-25 MCG/ACT AEPB [id number]  USE 1 PUFF ONCE PER DAY Corwin Levins, MD  Active   hydrALAZINE (APRESOLINE) 25 MG tablet [id number] No Take 1 tablet (25 mg total) by mouth 3 (three) times daily. Corwin Levins, MD Taking Active   hydrochlorothiazide (MICROZIDE) 12.5 MG capsule [id number] No TAKE 1 CAPSULE BY MOUTH EVERY DAY Corwin Levins, MD Taking Active   latanoprost (XALATAN) 0.005 % ophthalmic solution [id number] No Place 1 drop into the left eye at bedtime. [provider] Taking Active   meclizine (ANTIVERT) 25 MG tablet [id number] No TAKE 1 TABLET BY MOUTH 3 TIMES DAILY AS NEEDED FOR DIZZINESS. Corwin Levins, MD Taking Active   metoprolol succinate (TOPROL-XL) 100 MG 24 hr tablet [id number] No TAKE 1 TABLET BY MOUTH 2 (TWO) TIMES DAILY. TAKE WITH OR IMMEDIATELY FOLLOWING A MEAL. Quintella Reichert, MD Taking  Active   metoprolol succinate (TOPROL-XL) 25 MG 24 hr tablet [id number] No TAKE 1 TABLET BY MOUTH IN THE MORNING AND AT BEDTIME. TAKE THIS WITH 100 MG TABLET DAILY. Quintella Reichert, MD Taking Active   omeprazole (PRILOSEC) 40 MG capsule [id number] No Take 1 capsule (40 mg total) by mouth daily. Corwin Levins, MD Taking Active   oxybutynin (DITROPAN) 5 MG tablet [id number] No TAKE 1 TABLET (5 MG TOTAL) BY MOUTH DAILY AT 10 PM. Corwin Levins, MD Taking Active   potassium chloride SA (KLOR-CON M) 20 MEQ tablet [id number] No Take 1 tablet (20 mEq total) by mouth 2 (two) times daily for 5 days. Linwood Dibbles, MD Taking Expired 09/05/22 2359            Med Note Littie Deeds, CHERYL A   Wed Sep 05, 2022 10:11 AM) Patient takes 1BID still  predniSONE (DELTASONE) 10 MG tablet [id number] No 3 tabs by mouth per day for 3 days,2tabs per day for 3 days,1tab per day for 3 days Corwin Levins, MD Taking Active   rosuvastatin (CRESTOR) 40 MG tablet [id number] No TAKE 1 TABLET BY MOUTH EVERY DAY Quintella Reichert, MD Taking Active   telmisartan (MICARDIS) 80 MG tablet [id number] No Take 80 mg by mouth daily. [provider] Taking Active   traZODone (DESYREL) 100 MG tablet [id number] No TAKE 1 TABLET BY MOUTH AT BEDTIME AS NEEDED FOR SLEEP Crain, Whitney L, PA Taking Active             Home Care and Equipment/Supplies: Were Home Health Services Ordered?: NA Any new equipment or medical supplies ordered?: NA  Functional Questionnaire: Do you need assistance with bathing/showering or dressing?: No Do you need assistance with meal preparation?: No Do you need assistance with eating?: No Do you have difficulty maintaining continence: No Do you need assistance with getting out of bed/getting out of a chair/moving?: No Do you have difficulty managing or taking your medications?: No  Follow up appointments reviewed: PCP Follow-up appointment confirmed?: Yes Date of PCP follow-up appointment?: 07/11/23 Follow-up  Provider: [facility] Follow-up appointment confirmed?: NA Do you need transportation to your follow-up appointment?: No Do you understand care options if your condition(s) worsen?: Yes-patient verbalized understanding    SIGNATURE Karena Addison, LPN [facility] Nurse Health Advisor Direct Dial [phone]

## 2023-07-06 ENCOUNTER — Other Ambulatory Visit: Payer: Self-pay | Admitting: Cardiology

## 2023-07-08 ENCOUNTER — Other Ambulatory Visit: Payer: Self-pay | Admitting: Internal Medicine

## 2023-07-08 ENCOUNTER — Other Ambulatory Visit: Payer: Self-pay

## 2023-07-08 DIAGNOSIS — M1000 Idiopathic gout, unspecified site: Secondary | ICD-10-CM

## 2023-07-11 ENCOUNTER — Ambulatory Visit: Payer: Medicare Other | Admitting: Internal Medicine

## 2023-07-11 ENCOUNTER — Encounter: Payer: Self-pay | Admitting: Internal Medicine

## 2023-07-11 VITALS — BP 120/62 | HR 67 | Temp 98.9°F | Ht 63.0 in | Wt 185.0 lb

## 2023-07-11 DIAGNOSIS — I10 Essential (primary) hypertension: Secondary | ICD-10-CM

## 2023-07-11 DIAGNOSIS — E1165 Type 2 diabetes mellitus with hyperglycemia: Secondary | ICD-10-CM | POA: Diagnosis not present

## 2023-07-11 DIAGNOSIS — J309 Allergic rhinitis, unspecified: Secondary | ICD-10-CM | POA: Diagnosis not present

## 2023-07-11 DIAGNOSIS — E559 Vitamin D deficiency, unspecified: Secondary | ICD-10-CM | POA: Diagnosis not present

## 2023-07-11 DIAGNOSIS — Z7984 Long term (current) use of oral hypoglycemic drugs: Secondary | ICD-10-CM | POA: Diagnosis not present

## 2023-07-11 DIAGNOSIS — R519 Headache, unspecified: Secondary | ICD-10-CM | POA: Diagnosis not present

## 2023-07-11 DIAGNOSIS — N1831 Chronic kidney disease, stage 3a: Secondary | ICD-10-CM | POA: Diagnosis not present

## 2023-07-11 DIAGNOSIS — E7800 Pure hypercholesterolemia, unspecified: Secondary | ICD-10-CM | POA: Diagnosis not present

## 2023-07-11 NOTE — Assessment & Plan Note (Signed)
Lab Results  Component Value Date   LDLCALC 56 02/25/2023   Stable, pt to continue current statin crestor 40 qd

## 2023-07-11 NOTE — Assessment & Plan Note (Signed)
Lab Results  Component Value Date   HGBA1C 6.2 02/25/2023   Stable, pt to continue current medical treatment jardiance 25 qd

## 2023-07-11 NOTE — Assessment & Plan Note (Signed)
Etiology unclear, now resolved,  to f/u any worsening symptoms or concerns

## 2023-07-11 NOTE — Assessment & Plan Note (Signed)
Mild to mod, for otc allegra and/or nasacort asd,,  to f/u any worsening symptoms or concerns

## 2023-07-11 NOTE — Patient Instructions (Signed)
Ok to stop the hydralazine 25 mg (the one you take three times per day)  Please continue all other medications as before, and refills have been done if requested.  Please have the pharmacy call with any other refills you may need.  Please continue your efforts at being more active, low cholesterol diet, and weight control.  Please keep your appointments with your specialists as you may have planned - kidney doctors  Please make an Appointment to return in 6 months, or sooner if needed

## 2023-07-11 NOTE — Assessment & Plan Note (Signed)
Lab Results  Component Value Date   CREATININE 1.63 (H) 02/25/2023   Stable overall, cont to avoid nephrotoxins

## 2023-07-11 NOTE — Assessment & Plan Note (Signed)
BP Readings from Last 3 Encounters:  07/11/23 120/62  06/28/23 (!) 150/73  06/10/23 110/60   With dizziness likely overcontrolled, pt to d/c hydralazine 25 tid, cont card CD 240 every day, hct 12.5 every day, toprol xl 125 bid

## 2023-07-11 NOTE — Assessment & Plan Note (Signed)
Last vitamin D Lab Results  Component Value Date   VD25OH 58.79 09/06/2022   Stable, cont oral replacement

## 2023-07-11 NOTE — Progress Notes (Signed)
Patient ID: Brooke [name], female   DOB: [birth year], 72 y.o.   MRN: [id number]        Chief Complaint: follow up post ED visit dec 6 with scalp pain and dizziness, also dm, htn, hld       HPI:  Brooke [name] is a 72 y.o. female here overall doing ok,  Pt denies chest pain, increased sob or doe, wheezing, orthopnea, PND, increased LE swelling, palpitations, or syncope, but still has mild recurring dizzy as well, mostly since started taking the hydralazine 25 tid.  .   Pt denies polydipsia, polyuria, or new focal neuro s/s.    Pt denies fever, wt loss, night sweats, loss of appetite, or other constitutional symptoms  Scalp pain resolved. Does have several wks ongoing nasal allergy symptoms with clearish congestion, itch and sneezing, without fever, pain, ST, cough, swelling or wheezing.       Wt Readings from Last 3 Encounters:  07/11/23 185 lb (83.9 kg)  06/28/23 182 lb 15.7 oz (83 kg)  06/10/23 185 lb (83.9 kg)   BP Readings from Last 3 Encounters:  07/11/23 120/62  06/28/23 (!) 150/73  06/10/23 110/60         Past Medical History:  Diagnosis Date   Alopecia    Anxiety    Aortic atherosclerosis (HCC)    Asthma    FOLLOWED BY PCP   Eczema    GERD (gastroesophageal reflux disease)    Gout    04-28-2018--- per pt stable , as been a while since last episode   Heart murmur    History of colon polyps    History of syncope 2015   Hyperlipidemia    Hypertension    Hypothyroidism    OA (osteoarthritis) of knee    bilateral   PVC's (premature ventricular contractions)    Toxic multinodular goiter    03/ 2003  s/p RAI   Type 2 diabetes mellitus (HCC)    followed by pcp   Ventricular tachycardia, polymorphic (HCC) 01/04/2014   primary cardiologist-- dr Gloris [place] turner (hx monitor 2015 showed couplet PVCs, as trigger)   Wears glasses    Wears partial dentures    upper   Past Surgical History:  Procedure Laterality Date   ABDOMINAL HYSTERECTOMY  10/22/1999   WITH BSO   CATARACT  EXTRACTION W/ INTRAOCULAR LENS IMPLANT Left YRS AGO   COLONOSCOPY     EXCISION ABDOMINAL WALL MASS  12-20-2005   dr Jamey Ripa @[facility]    neruofibroma   LAPAROSCOPIC CHOLECYSTECTOMY  10/01/2002   dr Jamey Ripa @[facility]    LEFT HEART CATHETERIZATION WITH CORONARY ANGIOGRAM N/A 01/07/2014   Procedure: LEFT HEART CATHETERIZATION WITH CORONARY ANGIOGRAM;  Surgeon: Peter M Swaziland, MD;  Location: [facility] CATH LAB;  Service: Cardiovascular;  Laterality: N/A;   RASTELLI PROCEDURE  6/98 neg   RENAL ARTERY STENT Left 11/2003   angioplasty and stenting   RENAL ARTERY STENT Left 02/2005   re-stenting   TOTAL KNEE ARTHROPLASTY Right 05/05/2018   Procedure: RIGHT TOTAL KNEE ARTHROPLASTY;  Surgeon: Gean Birchwood, MD;  Location: [facility];  Service: Orthopedics;  Laterality: Right;   TOTAL KNEE ARTHROPLASTY Left 09/30/2018   Procedure: TOTAL KNEE ARTHROPLASTY;  Surgeon: Marcene Corning, MD;  Location: [facility];  Service: Orthopedics;  Laterality: Left;    reports that she has never smoked. She has never used smokeless tobacco. She reports that she does not drink alcohol and does not use drugs. family history includes Diabetes in her mother; Heart disease in her  father. Allergies  Allergen Reactions   Metformin And Related Nausea And Vomiting   Ace Inhibitors Swelling        Codeine Nausea And Vomiting and Rash   Hydrocodone Nausea And Vomiting   Tramadol Nausea And Vomiting   Tylenol [Acetaminophen] Itching and Rash   Current Outpatient Medications on File Prior to Visit  Medication Sig Dispense Refill   acetaminophen (TYLENOL) 325 MG tablet Take 650 mg by mouth every 6 (six) hours as needed for mild pain.     acetaZOLAMIDE (DIAMOX) 250 MG tablet Take by mouth.     albuterol (VENTOLIN HFA) 108 (90 Base) MCG/ACT inhaler Inhale 2 puffs into the lungs every 6 (six) hours as needed for wheezing or shortness of breath. 8 g 11   allopurinol (ZYLOPRIM) 100 MG tablet TAKE 1 TABLET BY MOUTH EVERY DAY 90 tablet 3   azelastine  (ASTELIN) 0.1 % nasal spray Place 2 sprays into both nostrils 2 (two) times daily. Use in each nostril as directed 30 mL 12   brimonidine (ALPHAGAN) 0.2 % ophthalmic solution 3 (three) times daily. 1 drop in the left eye three times daily     citalopram (CELEXA) 40 MG tablet TAKE 1 TABLET BY MOUTH EVERY DAY 90 tablet 3   clonazePAM (KLONOPIN) 0.5 MG tablet Take 1 tablet (0.5 mg total) by mouth daily as needed (vertigo). 90 tablet 1   colchicine 0.6 MG tablet TAKE 0.5 TABLETS (0.3 MG TOTAL) BY MOUTH DAILY AS NEEDED (GOUT OR PSUEDOGOUT PAIN). 45 tablet 1   diltiazem (CARDIZEM CD) 240 MG 24 hr capsule TAKE 1 CAPSULE BY MOUTH EVERY DAY 90 capsule 3   dorzolamide (TRUSOPT) 2 % ophthalmic solution Place 1 drop into the left eye 3 (three) times daily.     empagliflozin (JARDIANCE) 25 MG TABS tablet TAKE 1 TABLET BY MOUTH DAILY BEFORE BREAKFAST. 90 tablet 3   fexofenadine (ALLEGRA) 180 MG tablet Take 1 tablet (180 mg total) by mouth daily. 90 tablet 3   Fluticasone-Umeclidin-Vilant (TRELEGY ELLIPTA) 100-62.5-25 MCG/ACT AEPB USE 1 PUFF ONCE PER DAY 180 each 3   hydrochlorothiazide (MICROZIDE) 12.5 MG capsule TAKE 1 CAPSULE BY MOUTH EVERY DAY 90 capsule 2   latanoprost (XALATAN) 0.005 % ophthalmic solution Place 1 drop into the left eye at bedtime.     meclizine (ANTIVERT) 25 MG tablet TAKE 1 TABLET BY MOUTH 3 TIMES DAILY AS NEEDED FOR DIZZINESS. 90 tablet 1   metoprolol succinate (TOPROL-XL) 100 MG 24 hr tablet TAKE 1 TABLET BY MOUTH 2 (TWO) TIMES DAILY. TAKE WITH OR IMMEDIATELY FOLLOWING A MEAL. 180 tablet 0   metoprolol succinate (TOPROL-XL) 25 MG 24 hr tablet TAKE 1 TABLET BY MOUTH IN THE MORNING AND AT BEDTIME. TAKE THIS WITH 100 MG TABLET DAILY. 180 tablet 2   omeprazole (PRILOSEC) 40 MG capsule Take 1 capsule (40 mg total) by mouth daily. 90 capsule 3   oxybutynin (DITROPAN) 5 MG tablet TAKE 1 TABLET (5 MG TOTAL) BY MOUTH DAILY AT 10 PM. 90 tablet 3   predniSONE (DELTASONE) 10 MG tablet 3 tabs by mouth  per day for 3 days,2tabs per day for 3 days,1tab per day for 3 days 18 tablet 0   rosuvastatin (CRESTOR) 40 MG tablet TAKE 1 TABLET BY MOUTH EVERY DAY 90 tablet 3   telmisartan (MICARDIS) 80 MG tablet Take 80 mg by mouth daily.     traZODone (DESYREL) 100 MG tablet TAKE 1 TABLET BY MOUTH AT BEDTIME AS NEEDED FOR SLEEP 90 tablet 1  potassium chloride SA (KLOR-CON M) 20 MEQ tablet Take 1 tablet (20 mEq total) by mouth 2 (two) times daily for 5 days. 10 tablet 0   No current facility-administered medications on file prior to visit.        ROS:  All others reviewed and negative.  Objective        PE:  BP 120/62 (BP Location: Right Arm, Patient Position: Sitting, Cuff Size: Normal)   Pulse 67   Temp 98.9 F (37.2 C) (Oral)   Ht 5\' 3"  (1.6 m)   Wt 185 lb (83.9 kg)   SpO2 98%   BMI 32.77 kg/m                 Constitutional: Pt appears in NAD               HENT: Head: NCAT.                Right Ear: External ear normal.                 Left Ear: External ear normal.                Eyes: . Pupils are equal, round, and reactive to light. Conjunctivae and EOM are normal               Nose: without d/c or deformity               Neck: Neck supple. Gross normal ROM               Cardiovascular: Normal rate and regular rhythm.                 Pulmonary/Chest: Effort normal and breath sounds without rales or wheezing.                Abd:  Soft, NT, ND, + BS, no organomegaly               Neurological: Pt is alert. At baseline orientation, motor grossly intact               Skin: Skin is warm. No rashes, no other new lesions, LE edema - none               Psychiatric: Pt behavior is normal without agitation   Micro: none  Cardiac tracings I have personally interpreted today:  none  Pertinent Radiological findings (summarize): none   Lab Results  Component Value Date   WBC 6.8 09/06/2022   HGB 11.7 (L) 09/06/2022   HCT 36.9 09/06/2022   PLT 290.0 09/06/2022   GLUCOSE 101 (H)  02/25/2023   CHOL 119 02/25/2023   TRIG 91.0 02/25/2023   HDL 44.80 02/25/2023   LDLDIRECT 139.1 09/05/2009   LDLCALC 56 02/25/2023   ALT 12 02/25/2023   AST 15 02/25/2023   NA 140 02/25/2023   K 3.5 02/25/2023   CL 105 02/25/2023   CREATININE 1.63 (H) 02/25/2023   BUN 17 02/25/2023   CO2 26 02/25/2023   TSH 1.86 09/06/2022   INR 1.0 09/24/2018   HGBA1C 6.2 02/25/2023   MICROALBUR 2.4 (H) 09/06/2022   Assessment/Plan:  Brooke [name] is a 72 y.o. Black or African American [2] female with  has a past medical history of Alopecia, Anxiety, Aortic atherosclerosis (HCC), Asthma, Eczema, GERD (gastroesophageal reflux disease), Gout, Heart murmur, History of colon polyps, History of syncope (2015), Hyperlipidemia, Hypertension, Hypothyroidism, OA (osteoarthritis) of knee, PVC's (premature ventricular  contractions), Toxic multinodular goiter, Type 2 diabetes mellitus (HCC), Ventricular tachycardia, polymorphic (HCC) (01/04/2014), Wears glasses, and Wears partial dentures.  Diabetes Lab Results  Component Value Date   HGBA1C 6.2 02/25/2023   Stable, pt to continue current medical treatment jardiance 25 qd   Hyperlipidemia Lab Results  Component Value Date   LDLCALC 56 02/25/2023   Stable, pt to continue current statin crestor 40 qd   Essential hypertension BP Readings from Last 3 Encounters:  07/11/23 120/62  06/28/23 (!) 150/73  06/10/23 110/60   With dizziness likely overcontrolled, pt to d/c hydralazine 25 tid, cont card CD 240 every day, hct 12.5 every day, toprol xl 125 bid   CKD (chronic kidney disease) stage 3, GFR 30-59 ml/min (HCC) Lab Results  Component Value Date   CREATININE 1.63 (H) 02/25/2023   Stable overall, cont to avoid nephrotoxins   Vitamin D deficiency Last vitamin D Lab Results  Component Value Date   VD25OH 58.79 09/06/2022   Stable, cont oral replacement   Scalp pain Etiology unclear, now resolved,  to f/u any worsening symptoms or  concerns  Allergic rhinitis Mild to mod, for otc allegra and or nasacort asd,,  to f/u any worsening symptoms or concerns   Followup: Return in about 6 months (around 01/09/2024).  Oliver Barre, MD 07/11/2023 9:47 PM [facility] Internal Medicine

## 2023-07-19 ENCOUNTER — Other Ambulatory Visit: Payer: Self-pay

## 2023-07-19 ENCOUNTER — Other Ambulatory Visit: Payer: Self-pay | Admitting: Cardiology

## 2023-07-19 ENCOUNTER — Other Ambulatory Visit: Payer: Self-pay | Admitting: Internal Medicine

## 2023-08-13 ENCOUNTER — Other Ambulatory Visit: Payer: Self-pay | Admitting: Internal Medicine

## 2023-08-20 ENCOUNTER — Other Ambulatory Visit: Payer: Self-pay | Admitting: Internal Medicine

## 2023-09-24 ENCOUNTER — Telehealth: Payer: Self-pay | Admitting: Internal Medicine

## 2023-09-24 ENCOUNTER — Ambulatory Visit (INDEPENDENT_AMBULATORY_CARE_PROVIDER_SITE_OTHER): Payer: Medicare Other

## 2023-09-24 VITALS — BP 122/70 | HR 64 | Ht 63.5 in | Wt 195.2 lb

## 2023-09-24 DIAGNOSIS — Z0000 Encounter for general adult medical examination without abnormal findings: Secondary | ICD-10-CM

## 2023-09-24 DIAGNOSIS — Z1231 Encounter for screening mammogram for malignant neoplasm of breast: Secondary | ICD-10-CM

## 2023-09-24 DIAGNOSIS — E119 Type 2 diabetes mellitus without complications: Secondary | ICD-10-CM

## 2023-09-24 MED ORDER — TRAZODONE HCL 100 MG PO TABS: 100.0000 mg | ORAL_TABLET | Freq: Every evening | ORAL | 1 refills | Status: DC | PRN

## 2023-09-24 NOTE — Patient Instructions (Addendum)
 Brooke [name] , Thank you for taking time to come for your Medicare Wellness Visit. I appreciate your ongoing commitment to your health goals. Please review the following plan we discussed and let me know if I can assist you in the future.   Referrals/Orders/Follow-Ups/Clinician Recommendations: Aim for 30 minutes of exercise or brisk walking, 6-8 glasses of water, and 5 servings of fruits and vegetables each day. Referral for Mammogram and Podiatry (Dr Sharl Ma) for a Diabetic Foot Exam.  Educated and advised to get the Shingles vaccine at local pharmacy in 2025.  This is a list of the screening recommended for you and due dates:  Health Maintenance  Topic Date Due   Zoster (Shingles) Vaccine (1 of 2) 01/08/2002   Eye exam for diabetics  08/21/2023   Yearly kidney health urinalysis for diabetes  09/07/2023   Hemoglobin A1C  08/28/2023   Complete foot exam   09/07/2023   Mammogram  12/03/2023   Yearly kidney function blood test for diabetes  05/15/2024   Medicare Annual Wellness Visit  09/23/2024   DTaP/Tdap/Td vaccine (3 - Td or Tdap) 01/11/2027   DEXA scan (bone density measurement)  10/16/2027   Pneumonia Vaccine  Completed   Flu Shot  Completed   Hepatitis C Screening  Completed   HPV Vaccine  Aged Out   Colon Cancer Screening  Discontinued   COVID-19 Vaccine  Discontinued    Advanced directives: (In Chart) A copy of your advanced directives are scanned into your chart should your provider ever need it.  Next Medicare Annual Wellness Visit scheduled for next year: Yes - 2026

## 2023-09-24 NOTE — Telephone Encounter (Signed)
 Done erx

## 2023-09-24 NOTE — Telephone Encounter (Signed)
 Caller & Relationship to patient: Self  Call back number: [phone]   Date of last office visit: 12.19.24  Date of next office visit: 3.12.25  Medication(s) to be refilled:  traZODone (DESYREL) 100 MG tablet   Preferred Pharmacy:   [facility] [phone]  Phone: [phone]   Fax: [phone]
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
## 2023-10-02 ENCOUNTER — Encounter: Payer: Self-pay | Admitting: Internal Medicine

## 2023-10-02 ENCOUNTER — Ambulatory Visit (INDEPENDENT_AMBULATORY_CARE_PROVIDER_SITE_OTHER): Admitting: Internal Medicine

## 2023-10-02 VITALS — BP 120/70 | HR 70 | Temp 98.3°F | Ht 63.5 in | Wt 193.0 lb

## 2023-10-02 DIAGNOSIS — E559 Vitamin D deficiency, unspecified: Secondary | ICD-10-CM

## 2023-10-02 DIAGNOSIS — J4531 Mild persistent asthma with (acute) exacerbation: Secondary | ICD-10-CM

## 2023-10-02 DIAGNOSIS — Z7984 Long term (current) use of oral hypoglycemic drugs: Secondary | ICD-10-CM

## 2023-10-02 DIAGNOSIS — J309 Allergic rhinitis, unspecified: Secondary | ICD-10-CM

## 2023-10-02 DIAGNOSIS — E538 Deficiency of other specified B group vitamins: Secondary | ICD-10-CM | POA: Diagnosis not present

## 2023-10-02 DIAGNOSIS — E7800 Pure hypercholesterolemia, unspecified: Secondary | ICD-10-CM

## 2023-10-02 DIAGNOSIS — E1165 Type 2 diabetes mellitus with hyperglycemia: Secondary | ICD-10-CM | POA: Diagnosis not present

## 2023-10-02 DIAGNOSIS — I10 Essential (primary) hypertension: Secondary | ICD-10-CM | POA: Diagnosis not present

## 2023-10-02 DIAGNOSIS — N1831 Chronic kidney disease, stage 3a: Secondary | ICD-10-CM

## 2023-10-02 DIAGNOSIS — Z0001 Encounter for general adult medical examination with abnormal findings: Secondary | ICD-10-CM

## 2023-10-02 LAB — CBC WITH DIFFERENTIAL/PLATELET
Basophils Absolute: 0.1 10*3/uL (ref 0.0–0.1)
Basophils Relative: 0.7 % (ref 0.0–3.0)
Eosinophils Absolute: 0.1 10*3/uL (ref 0.0–0.7)
Eosinophils Relative: 0.9 % (ref 0.0–5.0)
HCT: 37.8 % (ref 36.0–46.0)
Hemoglobin: 12.1 g/dL (ref 12.0–15.0)
Lymphocytes Relative: 17.7 % (ref 12.0–46.0)
Lymphs Abs: 1.4 10*3/uL (ref 0.7–4.0)
MCHC: 32 g/dL (ref 30.0–36.0)
MCV: 89.2 fl (ref 78.0–100.0)
Monocytes Absolute: 0.9 10*3/uL (ref 0.1–1.0)
Monocytes Relative: 10.8 % (ref 3.0–12.0)
Neutro Abs: 5.6 10*3/uL (ref 1.4–7.7)
Neutrophils Relative %: 69.9 % (ref 43.0–77.0)
Platelets: 238 10*3/uL (ref 150.0–400.0)
RBC: 4.23 Mil/uL (ref 3.87–5.11)
RDW: 14.9 % (ref 11.5–15.5)
WBC: 8 10*3/uL (ref 4.0–10.5)

## 2023-10-02 LAB — LIPID PANEL
Cholesterol: 128 mg/dL (ref 0–200)
HDL: 49.4 mg/dL (ref >39.00)
LDL Cholesterol: 61 mg/dL (ref 0–99)
NonHDL: 78.89
Total CHOL/HDL Ratio: 3
Triglycerides: 91 mg/dL (ref 0.0–149.0)
VLDL: 18.2 mg/dL (ref 0.0–40.0)

## 2023-10-02 LAB — BASIC METABOLIC PANEL
BUN: 18 mg/dL (ref 6–23)
CO2: 24 meq/L (ref 19–32)
Calcium: 9.3 mg/dL (ref 8.4–10.5)
Chloride: 107 meq/L (ref 96–112)
Creatinine, Ser: 1.52 mg/dL — ABNORMAL HIGH (ref 0.40–1.20)
GFR: 34.24 mL/min — ABNORMAL LOW (ref >60.00)
Glucose, Bld: 103 mg/dL — ABNORMAL HIGH (ref 70–99)
Potassium: 3.4 meq/L — ABNORMAL LOW (ref 3.5–5.1)
Sodium: 140 meq/L (ref 135–145)

## 2023-10-02 LAB — HEPATIC FUNCTION PANEL
ALT: 14 U/L (ref 0–35)
AST: 16 U/L (ref 0–37)
Albumin: 3.7 g/dL (ref 3.5–5.2)
Alkaline Phosphatase: 108 U/L (ref 39–117)
Bilirubin, Direct: 0.1 mg/dL (ref 0.0–0.3)
Total Bilirubin: 0.3 mg/dL (ref 0.2–1.2)
Total Protein: 6.8 g/dL (ref 6.0–8.3)

## 2023-10-02 LAB — TSH: TSH: 2.86 u[IU]/mL (ref 0.35–5.50)

## 2023-10-02 LAB — VITAMIN B12: Vitamin B-12: 327 pg/mL (ref 211–911)

## 2023-10-02 LAB — MICROALBUMIN / CREATININE URINE RATIO
Creatinine,U: 70.1 mg/dL
Microalb Creat Ratio: 15.7 mg/g (ref 0.0–30.0)
Microalb, Ur: 1.1 mg/dL (ref 0.0–1.9)

## 2023-10-02 LAB — HEMOGLOBIN A1C: Hgb A1c MFr Bld: 6.1 % (ref 4.6–6.5)

## 2023-10-02 LAB — VITAMIN D 25 HYDROXY (VIT D DEFICIENCY, FRACTURES): VITD: 29.65 ng/mL — ABNORMAL LOW (ref 30.00–100.00)

## 2023-10-02 MED ORDER — HYDROCHLOROTHIAZIDE 12.5 MG PO CAPS: 12.5000 mg | ORAL_CAPSULE | Freq: Every day | ORAL | 3 refills | Status: DC

## 2023-10-02 MED ORDER — DILTIAZEM HCL ER COATED BEADS 240 MG PO CP24: 240.0000 mg | ORAL_CAPSULE | Freq: Every day | ORAL | 3 refills | Status: DC

## 2023-10-02 MED ORDER — PREDNISONE 10 MG PO TABS: ORAL_TABLET | ORAL | 0 refills | Status: DC

## 2023-10-02 NOTE — Progress Notes (Signed)
 Patient ID: Brooke [name], female   DOB: [birth year], 73 y.o.   MRN: [id number]         Chief Complaint:: wellness exam and allergies, asthma exacerbation, rash, dm, htn, ckd3a       HPI:  Brooke [name] is a 73 y.o. female here for wellness exam; for shingrix at pharmacy, o/w up to date; has mammogram scheduled for may 13                        Also Pt denies chest pain, orthopnea, PND, increased LE swelling, palpitations, dizziness or syncope, but has mild sob doe wheezing x 3 days.  Does have several wks ongoing nasal allergy symptoms with clearish congestion, itch and sneezing, without fever, pain, ST.  Also has eczema rash to extremities flare with itching in the past month.     Wt Readings from Last 3 Encounters:  10/02/23 193 lb (87.5 kg)  09/24/23 195 lb 3.2 oz (88.5 kg)  07/11/23 185 lb (83.9 kg)   BP Readings from Last 3 Encounters:  10/02/23 120/70  09/24/23 122/70  07/11/23 120/62   Immunization History  Administered Date(s) Administered   Fluad Quad(high Dose 65+) 06/21/2020, 06/05/2021, 09/06/2022, 05/12/2023   Influenza Split 04/18/2011, 04/03/2012   Influenza Whole 04/21/2008, 05/03/2009, 04/10/2010   Influenza, High Dose Seasonal PF 07/19/2017, 04/08/2018   Influenza,inj,Quad PF,6+ Mos 04/02/2013, 04/23/2014, 05/11/2015, 09/12/2016   PFIZER(Purple Top)SARS-COV-2 Vaccination 03/18/2020, 04/08/2020   Pneumococcal Conjugate-13 01/10/2017   Pneumococcal Polysaccharide-23 09/05/2009, 01/21/2018   Td 03/23/2004   Tdap 01/10/2017   Zoster, Live 08/18/2012   Health Maintenance Due  Topic Date Due   Zoster Vaccines- Shingrix (1 of 2) 01/08/2002      Past Medical History:  Diagnosis Date   Alopecia    Anxiety    Aortic atherosclerosis (HCC)    Asthma    FOLLOWED BY PCP   Eczema    GERD (gastroesophageal reflux disease)    Gout    04-28-2018--- per pt stable , as been a while since last episode   Heart murmur    History of colon polyps    History of syncope  2015   Hyperlipidemia    Hypertension    Hypothyroidism    OA (osteoarthritis) of knee    bilateral   PVC's (premature ventricular contractions)    Toxic multinodular goiter    03/ 2003  s/p RAI   Type 2 diabetes mellitus (HCC)    followed by pcp   Ventricular tachycardia, polymorphic (HCC) 01/04/2014   primary cardiologist-- dr Gloris [place] turner (hx monitor 2015 showed couplet PVCs, as trigger)   Wears glasses    Wears partial dentures    upper   Past Surgical History:  Procedure Laterality Date   ABDOMINAL HYSTERECTOMY  10/22/1999   WITH BSO   CATARACT EXTRACTION W/ INTRAOCULAR LENS IMPLANT Left YRS AGO   COLONOSCOPY     EXCISION ABDOMINAL WALL MASS  12-20-2005   dr Jamey Ripa @[facility]    neruofibroma   LAPAROSCOPIC CHOLECYSTECTOMY  10/01/2002   dr Jamey Ripa @[facility]    LEFT HEART CATHETERIZATION WITH CORONARY ANGIOGRAM N/A 01/07/2014   Procedure: LEFT HEART CATHETERIZATION WITH CORONARY ANGIOGRAM;  Surgeon: Peter M Swaziland, MD;  Location: [facility];  Service: Cardiovascular;  Laterality: N/A;   RASTELLI PROCEDURE  6/98 neg   RENAL ARTERY STENT Left 11/2003   angioplasty and stenting   RENAL ARTERY STENT Left 02/2005   re-stenting   TOTAL KNEE ARTHROPLASTY Right  05/05/2018   Procedure: RIGHT TOTAL KNEE ARTHROPLASTY;  Surgeon: Gean Birchwood, MD;  Location: [facility];  Service: Orthopedics;  Laterality: Right;   TOTAL KNEE ARTHROPLASTY Left 09/30/2018   Procedure: TOTAL KNEE ARTHROPLASTY;  Surgeon: Marcene Corning, MD;  Location: [facility];  Service: Orthopedics;  Laterality: Left;    reports that she has never smoked. She has never been exposed to tobacco smoke. She has never used smokeless tobacco. She reports that she does not drink alcohol and does not use drugs. family history includes Diabetes in her mother; Heart disease in her father. Allergies  Allergen Reactions   Metformin And Related Nausea And Vomiting   Ace Inhibitors Swelling        Codeine Nausea And Vomiting and Rash   Hydrocodone  Nausea And Vomiting   Tramadol Nausea And Vomiting   Tylenol [Acetaminophen] Itching and Rash   Current Outpatient Medications on File Prior to Visit  Medication Sig Dispense Refill   acetaminophen (TYLENOL) 325 MG tablet Take 650 mg by mouth every 6 (six) hours as needed for mild pain.     acetaZOLAMIDE (DIAMOX) 250 MG tablet Take by mouth.     albuterol (VENTOLIN HFA) 108 (90 Base) MCG/ACT inhaler Inhale 2 puffs into the lungs every 6 (six) hours as needed for wheezing or shortness of breath. 8 g 11   allopurinol (ZYLOPRIM) 100 MG tablet TAKE 1 TABLET BY MOUTH EVERY DAY 90 tablet 3   azelastine (ASTELIN) 0.1 % nasal spray Place 2 sprays into both nostrils 2 (two) times daily. Use in each nostril as directed 30 mL 12   brimonidine (ALPHAGAN) 0.2 % ophthalmic solution 3 (three) times daily. 1 drop in the left eye three times daily     citalopram (CELEXA) 40 MG tablet TAKE 1 TABLET BY MOUTH EVERY DAY 90 tablet 3   clonazePAM (KLONOPIN) 0.5 MG tablet Take 1 tablet (0.5 mg total) by mouth daily as needed (vertigo). 90 tablet 1   colchicine 0.6 MG tablet TAKE 0.5 TABLETS (0.3 MG TOTAL) BY MOUTH DAILY AS NEEDED (GOUT OR PSUEDOGOUT PAIN). 45 tablet 1   dorzolamide (TRUSOPT) 2 % ophthalmic solution Place 1 drop into the left eye 3 (three) times daily.     empagliflozin (JARDIANCE) 25 MG TABS tablet TAKE 1 TABLET BY MOUTH DAILY BEFORE BREAKFAST. 90 tablet 3   fexofenadine (ALLEGRA) 180 MG tablet Take 1 tablet (180 mg total) by mouth daily. 90 tablet 3   Fluticasone-Umeclidin-Vilant (TRELEGY ELLIPTA) 100-62.5-25 MCG/ACT AEPB USE 1 PUFF ONCE PER DAY 180 each 3   latanoprost (XALATAN) 0.005 % ophthalmic solution Place 1 drop into the left eye at bedtime.     meclizine (ANTIVERT) 25 MG tablet TAKE 1 TABLET BY MOUTH 3 TIMES A DAY AS NEEDED FOR DIZZINESS 90 tablet 1   metoprolol succinate (TOPROL-XL) 100 MG 24 hr tablet TAKE 1 TABLET BY MOUTH 2 (TWO) TIMES DAILY. TAKE WITH OR IMMEDIATELY FOLLOWING A MEAL. 180  tablet 2   metoprolol succinate (TOPROL-XL) 25 MG 24 hr tablet TAKE 1 TABLET BY MOUTH IN THE MORNING AND AT BEDTIME. TAKE THIS WITH 100 MG TABLET DAILY. 180 tablet 2   omeprazole (PRILOSEC) 40 MG capsule Take 1 capsule (40 mg total) by mouth daily. 90 capsule 3   oxybutynin (DITROPAN) 5 MG tablet TAKE 1 TABLET (5 MG TOTAL) BY MOUTH DAILY AT 10 PM. 90 tablet 3   rosuvastatin (CRESTOR) 40 MG tablet TAKE 1 TABLET BY MOUTH EVERY DAY 90 tablet 3   telmisartan (  MICARDIS) 80 MG tablet Take 80 mg by mouth daily.     traZODone (DESYREL) 100 MG tablet Take 1 tablet (100 mg total) by mouth at bedtime as needed. for sleep 90 tablet 1   potassium chloride SA (KLOR-CON M) 20 MEQ tablet Take 1 tablet (20 mEq total) by mouth 2 (two) times daily for 5 days. 10 tablet 0   No current facility-administered medications on file prior to visit.        ROS:  All others reviewed and negative.  Objective        PE:  BP 120/70 (BP Location: Right Arm, Patient Position: Sitting, Cuff Size: Normal)   Pulse 70   Temp 98.3 F (36.8 C) (Oral)   Ht 5' 3.5" (1.613 m)   Wt 193 lb (87.5 kg)   SpO2 97%   BMI 33.65 kg/m                 Constitutional: Pt appears in NAD               HENT: Head: NCAT.                Right Ear: External ear normal.                 Left Ear: External ear normal. Bilat tm's with mild erythema.  Max sinus areas non tender.  Pharynx with mild erythema, no exudate               Eyes: . Pupils are equal, round, and reactive to light. Conjunctivae and EOM are normal               Nose: without d/c or deformity               Neck: Neck supple. Gross normal ROM               Cardiovascular: Normal rate and regular rhythm.                 Pulmonary/Chest: Effort normal and breath sounds without rales but few bilateral wheezing.                Abd:  Soft, NT, ND, + BS, no organomegaly               Neurological: Pt is alert. At baseline orientation, motor grossly intact               Skin: Skin is  warm. Multiple eczema rashes noted to extremities, no other new lesions, LE edema - none               Psychiatric: Pt behavior is normal without agitation   Micro: none  Cardiac tracings I have personally interpreted today:  none  Pertinent Radiological findings (summarize): none   Lab Results  Component Value Date   WBC 8.0 10/02/2023   HGB 12.1 10/02/2023   HCT 37.8 10/02/2023   PLT 238.0 10/02/2023   GLUCOSE 103 (H) 10/02/2023   CHOL 128 10/02/2023   TRIG 91.0 10/02/2023   HDL 49.40 10/02/2023   LDLDIRECT 139.1 09/05/2009   LDLCALC 61 10/02/2023   ALT 14 10/02/2023   AST 16 10/02/2023   NA 140 10/02/2023   K 3.4 (L) 10/02/2023   CL 107 10/02/2023   CREATININE 1.52 (H) 10/02/2023   BUN 18 10/02/2023   CO2 24 10/02/2023   TSH 2.86 10/02/2023   INR 1.0 09/24/2018   HGBA1C 6.1 10/02/2023  MICROALBUR 1.1 10/02/2023   Assessment/Plan:  ERNESHA RAMONE is a 73 y.o. Black or African American [2] female with  has a past medical history of Alopecia, Anxiety, Aortic atherosclerosis (HCC), Asthma, Eczema, GERD (gastroesophageal reflux disease), Gout, Heart murmur, History of colon polyps, History of syncope (2015), Hyperlipidemia, Hypertension, Hypothyroidism, OA (osteoarthritis) of knee, PVC's (premature ventricular contractions), Toxic multinodular goiter, Type 2 diabetes mellitus (HCC), Ventricular tachycardia, polymorphic (HCC) (01/04/2014), Wears glasses, and Wears partial dentures.  Encounter for well adult exam with abnormal findings Age and sex appropriate education and counseling updated with regular exercise and diet Referrals for preventative services - has mammogram sched for may 13 Immunizations addressed - for shingrix at pharmacy Smoking counseling  - none needed Evidence for depression or other mood disorder - none significant Most recent labs reviewed. I have personally reviewed and have noted: 1) the patient's medical and social history 2) The patient's current  medications and supplements 3) The patient's height, weight, and BMI have been recorded in the chart   Allergic rhinitis Mild to mod, for refer to allergy per pt request  to f/u any worsening symptoms or concerns  CKD (chronic kidney disease) stage 3, GFR 30-59 ml/min (HCC) Lab Results  Component Value Date   CREATININE 1.52 (H) 10/02/2023   Stable overall, cont to avoid nephrotoxins   Diabetes Lab Results  Component Value Date   HGBA1C 6.1 10/02/2023   Stable, pt to continue current medical treatment jardiance 25 every day,    Essential hypertension BP Readings from Last 3 Encounters:  10/02/23 120/70  09/24/23 122/70  07/11/23 120/62   Stable, pt to continue medical treatment toprol xl 100 bid, micardis 80 qd   Hyperlipidemia Lab Results  Component Value Date   LDLCALC 61 10/02/2023   Stable, pt to continue current statin crestor 40 qd   Vitamin D deficiency Last vitamin D Lab Results  Component Value Date   VD25OH 29.65 (L) 10/02/2023   Low, to start oral replacement   Asthma exacerbation Mild to mod, for prednisone taper,,  to f/u any worsening symptoms or concerns  Followup: Return in about 6 months (around 04/03/2024).  Oliver Barre, MD 10/05/2023 9:07 PM  [facility] - [facility]

## 2023-10-02 NOTE — Patient Instructions (Addendum)
 Please have your Shingrix (shingles) shots done at your local pharmacy.  Please take all new medication as prescribed - the prednisone  Please continue all other medications as before, and refills have been done if requested.  Please have the pharmacy call with any other refills you may need.  Please continue your efforts at being more active, low cholesterol diet, and weight control.  You are otherwise up to date with prevention measures today.  Please keep your appointments with your specialists as you may have planned  You will be contacted regarding the referral for: Allergy  Please go to the LAB at the blood drawing area for the tests to be done  You will be contacted by phone if any changes need to be made immediately.  Otherwise, you will receive a letter about your results with an explanation, but please check with MyChart first.  Please make an Appointment to return in 6 months, or sooner if needed

## 2023-10-03 DIAGNOSIS — N1832 Chronic kidney disease, stage 3b: Secondary | ICD-10-CM | POA: Diagnosis not present

## 2023-10-03 DIAGNOSIS — D631 Anemia in chronic kidney disease: Secondary | ICD-10-CM | POA: Diagnosis not present

## 2023-10-03 DIAGNOSIS — R809 Proteinuria, unspecified: Secondary | ICD-10-CM | POA: Diagnosis not present

## 2023-10-03 DIAGNOSIS — E1122 Type 2 diabetes mellitus with diabetic chronic kidney disease: Secondary | ICD-10-CM | POA: Diagnosis not present

## 2023-10-03 DIAGNOSIS — I129 Hypertensive chronic kidney disease with stage 1 through stage 4 chronic kidney disease, or unspecified chronic kidney disease: Secondary | ICD-10-CM | POA: Diagnosis not present

## 2023-10-05 ENCOUNTER — Encounter: Payer: Self-pay | Admitting: Internal Medicine

## 2023-10-05 NOTE — Assessment & Plan Note (Signed)
 Lab Results  Component Value Date   HGBA1C 6.1 10/02/2023   Stable, pt to continue current medical treatment jardiance 25 every day,

## 2023-10-05 NOTE — Assessment & Plan Note (Signed)
 Lab Results  Component Value Date   LDLCALC 61 10/02/2023   Stable, pt to continue current statin crestor 40 qd

## 2023-10-05 NOTE — Assessment & Plan Note (Signed)
 Mild to mod, for refer to allergy per pt request  to f/u any worsening symptoms or concerns

## 2023-10-05 NOTE — Assessment & Plan Note (Signed)
 Lab Results  Component Value Date   CREATININE 1.52 (H) 10/02/2023   Stable overall, cont to avoid nephrotoxins

## 2023-10-05 NOTE — Assessment & Plan Note (Signed)
Mild to mod, for prednisone taper,,  to f/u any worsening symptoms or concerns  

## 2023-10-05 NOTE — Assessment & Plan Note (Signed)
 Age and sex appropriate education and counseling updated with regular exercise and diet Referrals for preventative services - has mammogram sched for may 13 Immunizations addressed - for shingrix at pharmacy Smoking counseling  - none needed Evidence for depression or other mood disorder - none significant Most recent labs reviewed. I have personally reviewed and have noted: 1) the patient's medical and social history 2) The patient's current medications and supplements 3) The patient's height, weight, and BMI have been recorded in the chart

## 2023-10-05 NOTE — Assessment & Plan Note (Signed)
 Last vitamin D Lab Results  Component Value Date   VD25OH 29.65 (L) 10/02/2023   Low, to start oral replacement

## 2023-10-05 NOTE — Assessment & Plan Note (Signed)
 BP Readings from Last 3 Encounters:  10/02/23 120/70  09/24/23 122/70  07/11/23 120/62   Stable, pt to continue medical treatment toprol xl 100 bid, micardis 80 qd

## 2023-10-24 ENCOUNTER — Ambulatory Visit: Admitting: Podiatry

## 2023-10-29 ENCOUNTER — Emergency Department (HOSPITAL_COMMUNITY)

## 2023-10-29 ENCOUNTER — Emergency Department (HOSPITAL_COMMUNITY)
Admission: EM | Admit: 2023-10-29 | Discharge: 2023-10-30 | Discharge status: Against Medical Advice | Attending: Emergency Medicine | Admitting: Emergency Medicine

## 2023-10-29 ENCOUNTER — Other Ambulatory Visit: Payer: Self-pay

## 2023-10-29 DIAGNOSIS — S0990XA Unspecified injury of head, initial encounter: Secondary | ICD-10-CM | POA: Diagnosis not present

## 2023-10-29 DIAGNOSIS — M25561 Pain in right knee: Secondary | ICD-10-CM | POA: Insufficient documentation

## 2023-10-29 DIAGNOSIS — Z96652 Presence of left artificial knee joint: Secondary | ICD-10-CM | POA: Diagnosis not present

## 2023-10-29 DIAGNOSIS — M25562 Pain in left knee: Secondary | ICD-10-CM | POA: Diagnosis not present

## 2023-10-29 DIAGNOSIS — W19XXXA Unspecified fall, initial encounter: Secondary | ICD-10-CM | POA: Diagnosis not present

## 2023-10-29 DIAGNOSIS — W010XXA Fall on same level from slipping, tripping and stumbling without subsequent striking against object, initial encounter: Secondary | ICD-10-CM | POA: Insufficient documentation

## 2023-10-29 DIAGNOSIS — Z96651 Presence of right artificial knee joint: Secondary | ICD-10-CM | POA: Diagnosis not present

## 2023-10-29 DIAGNOSIS — Z5321 Procedure and treatment not carried out due to patient leaving prior to being seen by health care provider: Secondary | ICD-10-CM | POA: Insufficient documentation

## 2023-10-29 DIAGNOSIS — R519 Headache, unspecified: Secondary | ICD-10-CM | POA: Insufficient documentation

## 2023-10-29 DIAGNOSIS — R609 Edema, unspecified: Secondary | ICD-10-CM | POA: Diagnosis not present

## 2023-10-29 DIAGNOSIS — M7651 Patellar tendinitis, right knee: Secondary | ICD-10-CM | POA: Diagnosis not present

## 2023-10-29 NOTE — ED Triage Notes (Signed)
 Patient bib [facility] for fall after tripping over dishwasher prior to arrival Bilateral knee pain and facial pain on left side.  No LOC. No blood thinners

## 2023-10-30 NOTE — ED Notes (Signed)
 Called pt about 5 times no response

## 2023-11-03 ENCOUNTER — Other Ambulatory Visit: Payer: Self-pay | Admitting: Internal Medicine

## 2023-11-04 ENCOUNTER — Other Ambulatory Visit: Payer: Self-pay

## 2023-11-06 ENCOUNTER — Ambulatory Visit: Admitting: Podiatry

## 2023-11-06 ENCOUNTER — Encounter: Payer: Self-pay | Admitting: Podiatry

## 2023-11-06 DIAGNOSIS — M79674 Pain in right toe(s): Secondary | ICD-10-CM

## 2023-11-06 DIAGNOSIS — M79675 Pain in left toe(s): Secondary | ICD-10-CM

## 2023-11-06 DIAGNOSIS — E1165 Type 2 diabetes mellitus with hyperglycemia: Secondary | ICD-10-CM | POA: Diagnosis not present

## 2023-11-06 DIAGNOSIS — I739 Peripheral vascular disease, unspecified: Secondary | ICD-10-CM

## 2023-11-06 DIAGNOSIS — B351 Tinea unguium: Secondary | ICD-10-CM

## 2023-11-06 DIAGNOSIS — N183 Chronic kidney disease, stage 3 unspecified: Secondary | ICD-10-CM

## 2023-11-06 NOTE — Progress Notes (Signed)
 This patient returns to my office for at risk foot care.  This patient requires this care by a professional since this patient will be at risk due to having diabetes and CKD.  This patient is unable to cut nails himself since the patient cannot reach his nails.These nails are painful walking and wearing shoes.  This patient presents for at risk foot care today.  General Appearance  Alert, conversant and in no acute stress.  Vascular  Dorsalis pedis  pulses are weakl  palpable  bilaterally.   Posterior tibial pulses are absent. Capillary return is within normal limits  bilaterally. Temperature is within normal limits  bilaterally.  Neurologic  Senn-Weinstein monofilament wire test within normal limits  bilaterally. Muscle power within normal limits bilaterally.  Nails Thick disfigured discolored nails with subungual debris  from hallux to fifth toes bilaterally. No evidence of bacterial infection or drainage bilaterally.  Orthopedic  No limitations of motion  feet .  No crepitus or effusions noted.  No bony pathology or digital deformities noted.  Skin  normotropic skin with no porokeratosis noted bilaterally.  No signs of infections or ulcers noted.     Onychomycosis  Pain in right toes  Pain in left toes  Consent was obtained for treatment procedures.   Mechanical debridement of nails 1-5  bilaterally performed with a nail nipper.  Filed with dremel without incident. Order vascular studies for this patient.   Return office visit     3 months                 Told patient to return for periodic foot care and evaluation due to potential at risk complications.   Ruffin Cotton DPM

## 2023-11-11 DIAGNOSIS — H4052X3 Glaucoma secondary to other eye disorders, left eye, severe stage: Secondary | ICD-10-CM | POA: Diagnosis not present

## 2023-11-26 ENCOUNTER — Other Ambulatory Visit: Payer: Self-pay

## 2023-11-26 ENCOUNTER — Other Ambulatory Visit: Payer: Self-pay | Admitting: Internal Medicine

## 2023-11-27 ENCOUNTER — Encounter: Payer: Self-pay | Admitting: Allergy

## 2023-11-27 ENCOUNTER — Ambulatory Visit: Admitting: Allergy

## 2023-11-27 ENCOUNTER — Other Ambulatory Visit: Payer: Self-pay

## 2023-11-27 VITALS — BP 138/68 | HR 66 | Temp 97.2°F | Resp 16 | Ht 63.39 in | Wt 194.5 lb

## 2023-11-27 DIAGNOSIS — J4550 Severe persistent asthma, uncomplicated: Secondary | ICD-10-CM

## 2023-11-27 DIAGNOSIS — J310 Chronic rhinitis: Secondary | ICD-10-CM

## 2023-11-27 DIAGNOSIS — L509 Urticaria, unspecified: Secondary | ICD-10-CM | POA: Diagnosis not present

## 2023-11-27 DIAGNOSIS — L299 Pruritus, unspecified: Secondary | ICD-10-CM | POA: Diagnosis not present

## 2023-11-27 MED ORDER — LEVOCETIRIZINE DIHYDROCHLORIDE 5 MG PO TABS: 5.0000 mg | ORAL_TABLET | Freq: Two times a day (BID) | ORAL | 5 refills | Status: DC

## 2023-11-27 MED ORDER — ALBUTEROL SULFATE HFA 108 (90 BASE) MCG/ACT IN AERS: 2.0000 | INHALATION_SPRAY | RESPIRATORY_TRACT | 1 refills | Status: DC | PRN

## 2023-11-27 MED ORDER — TRELEGY ELLIPTA 200-62.5-25 MCG/ACT IN AEPB: 1.0000 | INHALATION_SPRAY | Freq: Every day | RESPIRATORY_TRACT | 5 refills | Status: DC

## 2023-11-27 MED ORDER — FAMOTIDINE 20 MG PO TABS: 20.0000 mg | ORAL_TABLET | Freq: Two times a day (BID) | ORAL | 5 refills | Status: DC

## 2023-11-27 NOTE — Patient Instructions (Addendum)
 Itching, Rash  - at this time etiology of itching and rash is unknown.  Itching/rash can be caused by a variety of different triggers including illness/infection, foods, medications, stings, exercise, pressure, vibrations, extremes of temperature to name a few however majority of the time there is no identifiable trigger.  Your symptoms have been ongoing for >6 weeks making this chronic thus will obtain labwork to evaluate: tryptase, hive panel, environmental panel, alpha-gal panel, inflammatory markers.  Labwork from your PCP from March looks ok.   - for itch/rash control recommend use of Xyzal 5mg  1 tab twice a day with Pepcid 20mg  1 tab twice a day.   - if above regimen is not effective enough in controlling symptoms then would recommend considering Xolair monthly injections for control of itching/rash.   - Xyzal is replacing your Allegra   Asthma - Daily controller medication(s): increase to Trelegy 200/62.5/25mcg one puff once daily - Prior to physical activity if needed: albuterol  2 puffs 10-15 minutes before physical activity. - Rescue medications: albuterol  2 puffs every 4-6 hours as needed  - Asthma control goals:  * Full participation in all desired activities (may need albuterol  before activity) * Albuterol  use two time or less a week on average (not counting use with activity) * Cough interfering with sleep two time or less a month * Oral steroids no more than once a year * No hospitalizations  Allergies - Changing Allegra  to Xyzal as above - Will obtain environmental allergy panel   Follow-up in 2-3 months or sooner if needed

## 2023-11-27 NOTE — Progress Notes (Signed)
 New Patient Note  RE: Brooke [name] MRN: [id number] DOB: Jun 01, 1951 Date of Office Visit: 11/27/2023  Primary care provider: Roslyn Coombe, MD  Chief Complaint: allergies, asthma, itching  History of present illness: Brooke [name] is a 73 y.o. female presenting today for evaluation of allergies.  Discussed the use of AI scribe software for clinical note transcription with the patient, who gave verbal consent to proceed.  She has been experiencing itching and rash for about a year, with symptoms particularly pronounced at night. Her whole arm breaks out in small, itchy bumps that typically resolve by morning, leaving marks.  However she believes this touches her arm that breaks out.  The itching is described as being all over, including her face. No swelling of the lips, eyelids, ears, or toes, and no fevers associated with the itching and rash.  She has not noted any increase in her joint pain symptoms over the past year with the itching and rash.  She has tried Benadryl  for the itching, but it worsened her symptoms, leading her to discontinue its use. She has not tried any other treatments and has not been prescribed any medications for this issue. She takes Allegra  daily for allergies, but does not find it helpful. She also uses azelastine  nasal spray twice daily.  She has a history of asthma, diagnosed in adulthood, with symptoms of wheezing and coughing, particularly when exposed to outdoor environments. She uses an albuterol  inhaler twice a day and Trelegy 100mcg once a day, but continues to experience wheezing. She has not been hospitalized for asthma exacerbations. She completed a seven-day course of prednisone  about two months ago, which improved her breathing and itching, but she still required albuterol  use during that time.  She has a history of dizziness for which she takes meclizine  as needed, and she has colchicine  for gout, which she does not take daily.      Review of  systems: 10pt ROS negative unless noted above in HPI  Past medical history: Past Medical History:  Diagnosis Date   Alopecia    Anxiety    Aortic atherosclerosis (HCC)    Asthma    FOLLOWED BY PCP   Eczema    GERD (gastroesophageal reflux disease)    Gout    04-28-2018--- per pt stable , as been a while since last episode   Heart murmur    History of colon polyps    History of syncope 2015   Hyperlipidemia    Hypertension    Hypothyroidism    OA (osteoarthritis) of knee    bilateral   PVC's (premature ventricular contractions)    Toxic multinodular goiter    03/ 2003  s/p RAI   Type 2 diabetes mellitus (HCC)    followed by pcp   Ventricular tachycardia, polymorphic (HCC) 01/04/2014   primary cardiologist-- dr Sophia Dustman turner (hx monitor 2015 showed couplet PVCs, as trigger)   Wears glasses    Wears partial dentures    upper    Past surgical history: Past Surgical History:  Procedure Laterality Date   ABDOMINAL HYSTERECTOMY  10/22/1999   WITH BSO   CATARACT EXTRACTION W/ INTRAOCULAR LENS IMPLANT Left YRS AGO   COLONOSCOPY     EXCISION ABDOMINAL WALL MASS  12-20-2005   dr Linell Rhymes @[facility]    neruofibroma   LAPAROSCOPIC CHOLECYSTECTOMY  10/01/2002   dr Linell Rhymes @[facility]    LEFT HEART CATHETERIZATION WITH CORONARY ANGIOGRAM N/A 01/07/2014   Procedure: LEFT HEART CATHETERIZATION WITH CORONARY ANGIOGRAM;  Surgeon: Peter M Swaziland, MD;  Location: [facility] CATH LAB;  Service: Cardiovascular;  Laterality: N/A;   RASTELLI PROCEDURE  6/98 neg   RENAL ARTERY STENT Left 11/2003   angioplasty and stenting   RENAL ARTERY STENT Left 02/2005   re-stenting   TOTAL KNEE ARTHROPLASTY Right 05/05/2018   Procedure: RIGHT TOTAL KNEE ARTHROPLASTY;  Surgeon: Wendolyn Hamburger, MD;  Location: [facility];  Service: Orthopedics;  Laterality: Right;   TOTAL KNEE ARTHROPLASTY Left 09/30/2018   Procedure: TOTAL KNEE ARTHROPLASTY;  Surgeon: Dayne Even, MD;  Location: [facility];  Service: Orthopedics;  Laterality: Left;     Family history:  Family History  Problem Relation Age of Onset   Diabetes Mother    Heart disease Father    Colon cancer Neg Hx    Esophageal cancer Neg Hx    Rectal cancer Neg Hx    Stomach cancer Neg Hx     Social history: Lives in a home with carpeting with electric heating and central cooling.  No pets in the home.  No concern for water  damage, mildew or roaches in the home.  Does not work at this time.  No smoking history.    Medication List: Current Outpatient Medications  Medication Sig Dispense Refill   acetaminophen  (TYLENOL ) 325 MG tablet Take 650 mg by mouth every 6 (six) hours as needed for mild pain.     acetaZOLAMIDE (DIAMOX) 250 MG tablet Take by mouth.     albuterol  (VENTOLIN  HFA) 108 (90 Base) MCG/ACT inhaler Inhale 2 puffs into the lungs every 6 (six) hours as needed for wheezing or shortness of breath. 8 g 11   allopurinol  (ZYLOPRIM ) 100 MG tablet TAKE 1 TABLET BY MOUTH EVERY DAY 90 tablet 3   azelastine  (ASTELIN ) 0.1 % nasal spray Place 2 sprays into both nostrils 2 (two) times daily. Use in each nostril as directed 30 mL 12   brimonidine (ALPHAGAN) 0.2 % ophthalmic solution 3 (three) times daily. 1 drop in the left eye three times daily     citalopram  (CELEXA ) 40 MG tablet TAKE 1 TABLET BY MOUTH EVERY DAY 90 tablet 3   clonazePAM  (KLONOPIN ) 0.5 MG tablet Take 1 tablet (0.5 mg total) by mouth daily as needed (vertigo). 90 tablet 1   colchicine  0.6 MG tablet TAKE 0.5 TABLETS (0.3 MG TOTAL) BY MOUTH DAILY AS NEEDED (GOUT OR PSUEDOGOUT PAIN). 45 tablet 1   diltiazem  (CARDIZEM  CD) 240 MG 24 hr capsule Take 1 capsule (240 mg total) by mouth daily. 90 capsule 3   dorzolamide (TRUSOPT) 2 % ophthalmic solution Place 1 drop into the left eye 3 (three) times daily.     fexofenadine  (ALLEGRA ) 180 MG tablet Take 1 tablet (180 mg total) by mouth daily. 90 tablet 3   hydrochlorothiazide  (MICROZIDE ) 12.5 MG capsule Take 1 capsule (12.5 mg total) by mouth daily. 90 capsule 3    JARDIANCE  25 MG TABS tablet TAKE 1 TABLET BY MOUTH EVERY DAY BEFORE BREAKFAST 90 tablet 3   latanoprost (XALATAN) 0.005 % ophthalmic solution Place 1 drop into the left eye at bedtime.     meclizine  (ANTIVERT ) 25 MG tablet TAKE 1 TABLET BY MOUTH 3 TIMES A DAY AS NEEDED FOR DIZZINESS 90 tablet 1   metoprolol  succinate (TOPROL -XL) 100 MG 24 hr tablet TAKE 1 TABLET BY MOUTH 2 (TWO) TIMES DAILY. TAKE WITH OR IMMEDIATELY FOLLOWING A MEAL. 180 tablet 2   metoprolol  succinate (TOPROL -XL) 25 MG 24 hr tablet TAKE 1 TABLET BY MOUTH IN THE  MORNING AND AT BEDTIME. TAKE THIS WITH 100 MG TABLET DAILY. 180 tablet 2   oxybutynin  (DITROPAN ) 5 MG tablet TAKE 1 TABLET (5 MG TOTAL) BY MOUTH DAILY AT 10 PM. 90 tablet 3   rosuvastatin  (CRESTOR ) 40 MG tablet TAKE 1 TABLET BY MOUTH EVERY DAY 90 tablet 3   telmisartan  (MICARDIS ) 80 MG tablet Take 80 mg by mouth daily.     traZODone  (DESYREL ) 100 MG tablet Take 1 tablet (100 mg total) by mouth at bedtime as needed. for sleep 90 tablet 1   omeprazole  (PRILOSEC) 40 MG capsule Take 1 capsule (40 mg total) by mouth daily. (Patient not taking: Reported on 11/27/2023) 90 capsule 3   potassium chloride  SA (KLOR-CON  M) 20 MEQ tablet Take 1 tablet (20 mEq total) by mouth 2 (two) times daily for 5 days. 10 tablet 0   No current facility-administered medications for this visit.    Known medication allergies: Allergies  Allergen Reactions   Metformin  And Related Nausea And Vomiting   Ace Inhibitors Swelling        Codeine Nausea And Vomiting and Rash   Hydrocodone  Nausea And Vomiting   Tramadol  Nausea And Vomiting   Tylenol  [Acetaminophen ] Itching and Rash     Physical examination: There were no vitals taken for this visit.  General: Alert, interactive, in no acute distress. HEENT: PERRLA, TMs pearly gray, turbinates minimally edematous without discharge, post-pharynx non erythematous. Neck: Supple without lymphadenopathy. Lungs: Clear to auscultation without  wheezing, rhonchi or rales. {no increased work of breathing. CV: Normal S1, S2 without murmurs. Abdomen: Nondistended, nontender. Skin: Scattered pigmented macules over the arms bilaterally . Extremities:  No clubbing, cyanosis or edema. Neuro:   Grossly intact.  Diagnositics/Labs: Labs:  Component     Latest Ref Rng 10/02/2023  WBC     4.0 - 10.5 K/uL 8.0   RBC     3.87 - 5.11 Mil/uL 4.23   Hemoglobin     12.0 - 15.0 g/dL 54.2   HCT     70.6 - 23.7 % 37.8   MCV     78.0 - 100.0 fl 89.2   MCHC     30.0 - 36.0 g/dL 62.8   RDW     31.5 - 17.6 % 14.9   Platelets     150.0 - 400.0 K/uL 238.0   Neutrophils     43.0 - 77.0 % 69.9   Lymphocytes     12.0 - 46.0 % 17.7   Monocytes Relative     3.0 - 12.0 % 10.8   Eosinophil     0.0 - 5.0 % 0.9   Basophil     0.0 - 3.0 % 0.7   NEUT#     1.4 - 7.7 K/uL 5.6   Lymphs Abs     0.7 - 4.0 K/uL 1.4   Monocyte #     0.1 - 1.0 K/uL 0.9   Eosinophils Absolute     0.0 - 0.7 K/uL 0.1   Basophils Absolute     0.0 - 0.1 K/uL 0.1   Sodium     135 - 145 mEq/L 140   Potassium     3.5 - 5.1 mEq/L 3.4 (L)   Chloride     96 - 112 mEq/L 107   CO2     19 - 32 mEq/L 24   Glucose     70 - 99 mg/dL 160 (H)   BUN     6 - 23 mg/dL 18  Creatinine     0.40 - 1.20 mg/dL 4.09 (H)   GFR     >81.19 mL/min 34.24 (L)   Calcium      8.4 - 10.5 mg/dL 9.3   Total Bilirubin     0.2 - 1.2 mg/dL 0.3   Bilirubin, Direct     0.0 - 0.3 mg/dL 0.1   Alkaline Phosphatase     39 - 117 U/L 108   AST     0 - 37 U/L 16   ALT     0 - 35 U/L 14   Total Protein     6.0 - 8.3 g/dL 6.8   Albumin     3.5 - 5.2 g/dL 3.7   VITD     14.78 - 100.00 ng/mL 29.65 (L)   Vitamin B12     211 - 911 pg/mL 327   TSH     0.35 - 5.50 uIU/mL 2.86     Spirometry: FEV1: 1.72L 96%, FVC: 1.82L 79%, ratio consistent with nonobstructive pattern  Assessment and plan: Itching, Rash  - at this time etiology of itching and rash is unknown.  Itching/rash can be caused by  a variety of different triggers including illness/infection, foods, medications, stings, exercise, pressure, vibrations, extremes of temperature to name a few however majority of the time there is no identifiable trigger.  Your symptoms have been ongoing for >6 weeks making this chronic thus will obtain labwork to evaluate: tryptase, hive panel, environmental panel, alpha-gal panel, inflammatory markers.  Labwork from your PCP from March looks ok.   - for itch/rash control recommend use of Xyzal 5mg  1 tab twice a day with Pepcid 20mg  1 tab twice a day.   - if above regimen is not effective enough in controlling symptoms then would recommend considering Xolair monthly injections for control of itching/rash.   - Xyzal is replacing your Allegra   Asthma - Daily controller medication(s): increase to Trelegy 200/62.5/25mcg one puff once daily - Prior to physical activity if needed: albuterol  2 puffs 10-15 minutes before physical activity. - Rescue medications: albuterol  2 puffs every 4-6 hours as needed  - Asthma control goals:  * Full participation in all desired activities (may need albuterol  before activity) * Albuterol  use two time or less a week on average (not counting use with activity) * Cough interfering with sleep two time or less a month * Oral steroids no more than once a year * No hospitalizations  Rhinitis - Changing Allegra  to Xyzal as above - Will obtain environmental allergy panel   Follow-up in 2-3 months or sooner if needed  I appreciate the opportunity to take part in Taronda's care. Please do not hesitate to contact me with questions.  Sincerely,   Catha Clink, MD Allergy/Immunology [facility]

## 2023-11-29 ENCOUNTER — Ambulatory Visit (HOSPITAL_COMMUNITY)
Admission: RE | Admit: 2023-11-29 | Discharge: 2023-11-29 | Disposition: A | Discharge status: Home / Self Care | Source: Ambulatory Visit | Attending: Podiatry | Admitting: Podiatry

## 2023-11-29 DIAGNOSIS — I739 Peripheral vascular disease, unspecified: Secondary | ICD-10-CM | POA: Diagnosis not present

## 2023-11-29 DIAGNOSIS — E1165 Type 2 diabetes mellitus with hyperglycemia: Secondary | ICD-10-CM | POA: Insufficient documentation

## 2023-11-29 LAB — VAS US ABI WITH/WO TBI
Left ABI: 1.05
Right ABI: 1.02

## 2023-12-06 LAB — ALLERGENS W/TOTAL IGE AREA 2

## 2023-12-06 LAB — ALPHA-GAL PANEL
Allergen Lamb IgE: <0.1 kU/L
Beef IgE: <0.1 kU/L
IgE (Immunoglobulin E), Serum: 31 [IU]/mL (ref 6–495)
O215-IgE Alpha-Gal: <0.1 kU/L
Pork IgE: <0.1 kU/L

## 2023-12-06 LAB — TRYPTASE: Tryptase: 8.1 ug/L (ref 2.2–13.2)

## 2023-12-06 LAB — ANA W/REFLEX: Anti Nuclear Antibody (ANA): NEGATIVE

## 2023-12-06 LAB — SEDIMENTATION RATE: Sed Rate: 68 mm/h — ABNORMAL HIGH (ref 0–40)

## 2023-12-06 LAB — THYROID ANTIBODIES (THYROPEROXIDASE & THYROGLOBULIN)
Thyroglobulin Antibody: <1 [IU]/mL (ref 0.0–0.9)
Thyroperoxidase Ab SerPl-aCnc: <9 [IU]/mL (ref 0–34)

## 2023-12-06 LAB — CHRONIC URTICARIA: cu index: 3.5 (ref <10)

## 2023-12-09 ENCOUNTER — Encounter

## 2023-12-11 ENCOUNTER — Telehealth: Payer: Self-pay | Admitting: Allergy

## 2023-12-11 NOTE — Telephone Encounter (Signed)
 Patient called and asked about the status of her lab results from 05/07.  Patient would like a call at [phone]

## 2023-12-12 ENCOUNTER — Ambulatory Visit
Admission: RE | Admit: 2023-12-12 | Discharge: 2023-12-12 | Disposition: A | Discharge status: Home / Self Care | Source: Ambulatory Visit | Attending: Internal Medicine | Admitting: Internal Medicine

## 2023-12-12 DIAGNOSIS — Z1231 Encounter for screening mammogram for malignant neoplasm of breast: Secondary | ICD-10-CM

## 2023-12-13 NOTE — Telephone Encounter (Signed)
 I called the patient and informed labs have came back but Dr.Padgett has not resulted them yet. I informed once resulted a call will be made to her informing of results. I informed to allow 1-2 weeks.

## 2023-12-20 ENCOUNTER — Ambulatory Visit: Payer: Self-pay | Admitting: Allergy

## 2023-12-31 ENCOUNTER — Other Ambulatory Visit: Payer: Self-pay | Admitting: Allergy

## 2023-12-31 MED ORDER — LEVOCETIRIZINE DIHYDROCHLORIDE 5 MG PO TABS: 5.0000 mg | ORAL_TABLET | Freq: Two times a day (BID) | ORAL | 3 refills | Status: DC | PRN

## 2023-12-31 NOTE — Addendum Note (Signed)
 Addended by: Angelena Barber D on: 12/31/2023 05:06 PM   Modules accepted: Orders

## 2024-01-01 ENCOUNTER — Telehealth: Payer: Self-pay | Admitting: Allergy

## 2024-01-01 ENCOUNTER — Other Ambulatory Visit: Payer: Self-pay | Admitting: Allergy

## 2024-01-01 NOTE — Telephone Encounter (Signed)
 Brooke [name] called and stated that she needs a prescription for something that is comparable to the Levocetirizine (Xyzal ) 5 mg per her pharmacy. She uses the [facility] on [address], and best contact number for her is [phone].

## 2024-01-02 NOTE — Telephone Encounter (Signed)
 Okay to switch to cetirizine?

## 2024-01-03 MED ORDER — CETIRIZINE HCL 10 MG PO TABS: 10.0000 mg | ORAL_TABLET | Freq: Every day | ORAL | 5 refills | Status: DC

## 2024-01-03 NOTE — Telephone Encounter (Signed)
 Thank you zyrtec was sent on 01/02/24 by a nurse.

## 2024-01-03 NOTE — Addendum Note (Signed)
 Addended by: Angelena Barber D on: 01/03/2024 02:43 PM   Modules accepted: Orders

## 2024-01-03 NOTE — Telephone Encounter (Signed)
 I called the patient to see if she was not able to get the xyzal  and wanted a different allergy medication sent in.  I was unable to leave a message will try to call back this afternoon.

## 2024-01-09 ENCOUNTER — Telehealth: Payer: Self-pay | Admitting: Internal Medicine

## 2024-01-09 MED ORDER — TIRZEPATIDE 2.5 MG/0.5ML ~~LOC~~ SOAJ: 2.5000 mg | SUBCUTANEOUS | 11 refills | Status: DC

## 2024-01-09 NOTE — Telephone Encounter (Signed)
 Ok sure this is ok - done erx

## 2024-01-09 NOTE — Telephone Encounter (Signed)
 Copied from CRM [id number]. Topic: Clinical - Medication Question >> Jan 09, 2024 10:25 AM Kita Perish H wrote: Reason for CRM: Patient states she saw kidney doctor last month and was okay with Dr. Autry Legions prescribing her the Mounjaro, patient would like to speak with Dr. Koleen Perna nurse to see what he thinks, please reach out.  Denisha [phone]

## 2024-01-10 NOTE — Telephone Encounter (Signed)
 Called and let Pt know

## 2024-01-16 DIAGNOSIS — E119 Type 2 diabetes mellitus without complications: Secondary | ICD-10-CM | POA: Diagnosis not present

## 2024-02-05 ENCOUNTER — Ambulatory Visit: Admitting: Podiatry

## 2024-02-14 ENCOUNTER — Encounter: Payer: Self-pay | Admitting: Podiatry

## 2024-02-14 ENCOUNTER — Ambulatory Visit: Admitting: Podiatry

## 2024-02-14 DIAGNOSIS — M79674 Pain in right toe(s): Secondary | ICD-10-CM | POA: Diagnosis not present

## 2024-02-14 DIAGNOSIS — M79675 Pain in left toe(s): Secondary | ICD-10-CM | POA: Diagnosis not present

## 2024-02-14 DIAGNOSIS — N183 Chronic kidney disease, stage 3 unspecified: Secondary | ICD-10-CM

## 2024-02-14 DIAGNOSIS — E1165 Type 2 diabetes mellitus with hyperglycemia: Secondary | ICD-10-CM

## 2024-02-14 DIAGNOSIS — B351 Tinea unguium: Secondary | ICD-10-CM | POA: Diagnosis not present

## 2024-02-14 NOTE — Progress Notes (Signed)
 This patient returns to my office for at risk foot care.  This patient requires this care by a professional since this patient will be at risk due to having diabetes and CKD.  This patient is unable to cut nails himself since the patient cannot reach his nails.These nails are painful walking and wearing shoes.  This patient presents for at risk foot care today.  General Appearance  Alert, conversant and in no acute stress.  Vascular  Dorsalis pedis  pulses are weakl  palpable  bilaterally.   Posterior tibial pulses are absent. Capillary return is within normal limits  bilaterally. Temperature is within normal limits  bilaterally.  Neurologic  Senn-Weinstein monofilament wire test within normal limits  bilaterally. Muscle power within normal limits bilaterally.  Nails Thick disfigured discolored nails with subungual debris  from hallux to fifth toes bilaterally. No evidence of bacterial infection or drainage bilaterally.  Orthopedic  No limitations of motion  feet .  No crepitus or effusions noted.  No bony pathology or digital deformities noted.  Skin  normotropic skin with no porokeratosis noted bilaterally.  No signs of infections or ulcers noted.     Onychomycosis  Pain in right toes  Pain in left toes  Consent was obtained for treatment procedures.   Mechanical debridement of nails 1-5  bilaterally performed with a nail nipper.  Filed with dremel without incident. Vascular studies were acceptable.   Return office visit     3 months                 Told patient to return for periodic foot care and evaluation due to potential at risk complications.   Cordella Bold DPM

## 2024-02-18 ENCOUNTER — Encounter (HOSPITAL_COMMUNITY): Payer: Self-pay | Admitting: Emergency Medicine

## 2024-02-18 ENCOUNTER — Observation Stay (HOSPITAL_COMMUNITY)

## 2024-02-18 ENCOUNTER — Emergency Department (HOSPITAL_COMMUNITY)

## 2024-02-18 ENCOUNTER — Inpatient Hospital Stay (HOSPITAL_COMMUNITY)
Admission: EM | Admit: 2024-02-18 | Discharge: 2024-02-21 | DRG: 682 | Disposition: A | Discharge status: Home / Self Care | Attending: Family Medicine | Admitting: Family Medicine

## 2024-02-18 DIAGNOSIS — Z885 Allergy status to narcotic agent status: Secondary | ICD-10-CM

## 2024-02-18 DIAGNOSIS — D649 Anemia, unspecified: Secondary | ICD-10-CM | POA: Diagnosis not present

## 2024-02-18 DIAGNOSIS — E876 Hypokalemia: Secondary | ICD-10-CM | POA: Diagnosis not present

## 2024-02-18 DIAGNOSIS — J4521 Mild intermittent asthma with (acute) exacerbation: Secondary | ICD-10-CM | POA: Diagnosis not present

## 2024-02-18 DIAGNOSIS — I700 Atherosclerosis of aorta: Secondary | ICD-10-CM | POA: Diagnosis not present

## 2024-02-18 DIAGNOSIS — E871 Hypo-osmolality and hyponatremia: Secondary | ICD-10-CM | POA: Diagnosis present

## 2024-02-18 DIAGNOSIS — K869 Disease of pancreas, unspecified: Secondary | ICD-10-CM | POA: Diagnosis present

## 2024-02-18 DIAGNOSIS — I129 Hypertensive chronic kidney disease with stage 1 through stage 4 chronic kidney disease, or unspecified chronic kidney disease: Secondary | ICD-10-CM | POA: Diagnosis not present

## 2024-02-18 DIAGNOSIS — N3090 Cystitis, unspecified without hematuria: Secondary | ICD-10-CM | POA: Diagnosis not present

## 2024-02-18 DIAGNOSIS — Z7951 Long term (current) use of inhaled steroids: Secondary | ICD-10-CM

## 2024-02-18 DIAGNOSIS — K8689 Other specified diseases of pancreas: Secondary | ICD-10-CM | POA: Diagnosis not present

## 2024-02-18 DIAGNOSIS — E860 Dehydration: Secondary | ICD-10-CM | POA: Diagnosis present

## 2024-02-18 DIAGNOSIS — N179 Acute kidney failure, unspecified: Secondary | ICD-10-CM | POA: Diagnosis not present

## 2024-02-18 DIAGNOSIS — Z9842 Cataract extraction status, left eye: Secondary | ICD-10-CM

## 2024-02-18 DIAGNOSIS — I10 Essential (primary) hypertension: Secondary | ICD-10-CM | POA: Diagnosis not present

## 2024-02-18 DIAGNOSIS — Z0389 Encounter for observation for other suspected diseases and conditions ruled out: Secondary | ICD-10-CM | POA: Diagnosis not present

## 2024-02-18 DIAGNOSIS — J449 Chronic obstructive pulmonary disease, unspecified: Secondary | ICD-10-CM | POA: Diagnosis present

## 2024-02-18 DIAGNOSIS — J45909 Unspecified asthma, uncomplicated: Secondary | ICD-10-CM | POA: Diagnosis present

## 2024-02-18 DIAGNOSIS — K862 Cyst of pancreas: Secondary | ICD-10-CM | POA: Diagnosis present

## 2024-02-18 DIAGNOSIS — R935 Abnormal findings on diagnostic imaging of other abdominal regions, including retroperitoneum: Secondary | ICD-10-CM | POA: Diagnosis not present

## 2024-02-18 DIAGNOSIS — M109 Gout, unspecified: Secondary | ICD-10-CM | POA: Diagnosis not present

## 2024-02-18 DIAGNOSIS — R531 Weakness: Secondary | ICD-10-CM

## 2024-02-18 DIAGNOSIS — Z1152 Encounter for screening for COVID-19: Secondary | ICD-10-CM

## 2024-02-18 DIAGNOSIS — J440 Chronic obstructive pulmonary disease with acute lower respiratory infection: Secondary | ICD-10-CM | POA: Diagnosis present

## 2024-02-18 DIAGNOSIS — J189 Pneumonia, unspecified organism: Secondary | ICD-10-CM | POA: Diagnosis present

## 2024-02-18 DIAGNOSIS — K5730 Diverticulosis of large intestine without perforation or abscess without bleeding: Secondary | ICD-10-CM | POA: Diagnosis present

## 2024-02-18 DIAGNOSIS — I959 Hypotension, unspecified: Secondary | ICD-10-CM | POA: Diagnosis present

## 2024-02-18 DIAGNOSIS — N3000 Acute cystitis without hematuria: Secondary | ICD-10-CM | POA: Diagnosis present

## 2024-02-18 DIAGNOSIS — B961 Klebsiella pneumoniae [K. pneumoniae] as the cause of diseases classified elsewhere: Secondary | ICD-10-CM | POA: Diagnosis present

## 2024-02-18 DIAGNOSIS — Z8249 Family history of ischemic heart disease and other diseases of the circulatory system: Secondary | ICD-10-CM

## 2024-02-18 DIAGNOSIS — Z886 Allergy status to analgesic agent status: Secondary | ICD-10-CM

## 2024-02-18 DIAGNOSIS — I447 Left bundle-branch block, unspecified: Secondary | ICD-10-CM | POA: Diagnosis present

## 2024-02-18 DIAGNOSIS — Z90710 Acquired absence of both cervix and uterus: Secondary | ICD-10-CM

## 2024-02-18 DIAGNOSIS — L659 Nonscarring hair loss, unspecified: Secondary | ICD-10-CM | POA: Diagnosis present

## 2024-02-18 DIAGNOSIS — E1122 Type 2 diabetes mellitus with diabetic chronic kidney disease: Secondary | ICD-10-CM | POA: Diagnosis present

## 2024-02-18 DIAGNOSIS — N1832 Chronic kidney disease, stage 3b: Secondary | ICD-10-CM | POA: Diagnosis present

## 2024-02-18 DIAGNOSIS — N183 Chronic kidney disease, stage 3 unspecified: Secondary | ICD-10-CM | POA: Diagnosis not present

## 2024-02-18 DIAGNOSIS — Z860100 Personal history of colon polyps, unspecified: Secondary | ICD-10-CM

## 2024-02-18 DIAGNOSIS — E785 Hyperlipidemia, unspecified: Secondary | ICD-10-CM | POA: Diagnosis present

## 2024-02-18 DIAGNOSIS — E039 Hypothyroidism, unspecified: Secondary | ICD-10-CM | POA: Diagnosis not present

## 2024-02-18 DIAGNOSIS — Z7984 Long term (current) use of oral hypoglycemic drugs: Secondary | ICD-10-CM

## 2024-02-18 DIAGNOSIS — Z833 Family history of diabetes mellitus: Secondary | ICD-10-CM

## 2024-02-18 DIAGNOSIS — Z79899 Other long term (current) drug therapy: Secondary | ICD-10-CM

## 2024-02-18 DIAGNOSIS — R809 Proteinuria, unspecified: Secondary | ICD-10-CM | POA: Diagnosis present

## 2024-02-18 DIAGNOSIS — Z888 Allergy status to other drugs, medicaments and biological substances status: Secondary | ICD-10-CM

## 2024-02-18 DIAGNOSIS — Z87891 Personal history of nicotine dependence: Secondary | ICD-10-CM | POA: Diagnosis not present

## 2024-02-18 DIAGNOSIS — N281 Cyst of kidney, acquired: Secondary | ICD-10-CM | POA: Diagnosis not present

## 2024-02-18 DIAGNOSIS — Z7985 Long-term (current) use of injectable non-insulin antidiabetic drugs: Secondary | ICD-10-CM | POA: Diagnosis not present

## 2024-02-18 DIAGNOSIS — R599 Enlarged lymph nodes, unspecified: Secondary | ICD-10-CM | POA: Diagnosis not present

## 2024-02-18 DIAGNOSIS — Z961 Presence of intraocular lens: Secondary | ICD-10-CM | POA: Diagnosis present

## 2024-02-18 DIAGNOSIS — F419 Anxiety disorder, unspecified: Secondary | ICD-10-CM | POA: Diagnosis present

## 2024-02-18 DIAGNOSIS — R413 Other amnesia: Secondary | ICD-10-CM | POA: Diagnosis not present

## 2024-02-18 DIAGNOSIS — D631 Anemia in chronic kidney disease: Secondary | ICD-10-CM | POA: Diagnosis present

## 2024-02-18 DIAGNOSIS — R011 Cardiac murmur, unspecified: Secondary | ICD-10-CM | POA: Diagnosis present

## 2024-02-18 DIAGNOSIS — K7689 Other specified diseases of liver: Secondary | ICD-10-CM | POA: Diagnosis not present

## 2024-02-18 DIAGNOSIS — Z96653 Presence of artificial knee joint, bilateral: Secondary | ICD-10-CM | POA: Diagnosis present

## 2024-02-18 DIAGNOSIS — K219 Gastro-esophageal reflux disease without esophagitis: Secondary | ICD-10-CM | POA: Diagnosis present

## 2024-02-18 LAB — TROPONIN I (HIGH SENSITIVITY)
Troponin I (High Sensitivity): 11 ng/L (ref <18)
Troponin I (High Sensitivity): 9 ng/L (ref <18)

## 2024-02-18 LAB — COMPREHENSIVE METABOLIC PANEL WITH GFR
ALT: 26 U/L (ref 0–44)
AST: 30 U/L (ref 15–41)
Albumin: 3 g/dL — ABNORMAL LOW (ref 3.5–5.0)
Alkaline Phosphatase: 122 U/L (ref 38–126)
Anion gap: 9 (ref 5–15)
BUN: 37 mg/dL — ABNORMAL HIGH (ref 8–23)
CO2: 24 mmol/L (ref 22–32)
Calcium: 8.5 mg/dL — ABNORMAL LOW (ref 8.9–10.3)
Chloride: 101 mmol/L (ref 98–111)
Creatinine, Ser: 2.84 mg/dL — ABNORMAL HIGH (ref 0.44–1.00)
GFR, Estimated: 17 mL/min — ABNORMAL LOW (ref >60)
Glucose, Bld: 91 mg/dL (ref 70–99)
Potassium: 2.7 mmol/L — CL (ref 3.5–5.1)
Sodium: 134 mmol/L — ABNORMAL LOW (ref 135–145)
Total Bilirubin: 0.8 mg/dL (ref 0.0–1.2)
Total Protein: 6.8 g/dL (ref 6.5–8.1)

## 2024-02-18 LAB — CBC
HCT: 32.6 % — ABNORMAL LOW (ref 36.0–46.0)
Hemoglobin: 10.2 g/dL — ABNORMAL LOW (ref 12.0–15.0)
MCH: 26.2 pg (ref 26.0–34.0)
MCHC: 31.3 g/dL (ref 30.0–36.0)
MCV: 83.8 fL (ref 80.0–100.0)
Platelets: 250 K/uL (ref 150–400)
RBC: 3.89 MIL/uL (ref 3.87–5.11)
RDW: 15.2 % (ref 11.5–15.5)
WBC: 9.6 K/uL (ref 4.0–10.5)
nRBC: 0 % (ref 0.0–0.2)

## 2024-02-18 LAB — BLOOD GAS, VENOUS
Acid-base deficit: 1.3 mmol/L (ref 0.0–2.0)
Bicarbonate: 23.7 mmol/L (ref 20.0–28.0)
O2 Saturation: 73.3 %
Patient temperature: 37
pCO2, Ven: 40 mmHg — ABNORMAL LOW (ref 44–60)
pH, Ven: 7.38 (ref 7.25–7.43)
pO2, Ven: 43 mmHg (ref 32–45)

## 2024-02-18 LAB — IRON AND TIBC
Iron: 39 ug/dL (ref 28–170)
Saturation Ratios: 21 % (ref 10.4–31.8)
TIBC: 186 ug/dL — ABNORMAL LOW (ref 250–450)
UIBC: 147 ug/dL

## 2024-02-18 LAB — I-STAT CHEM 8, ED
BUN: 36 mg/dL — ABNORMAL HIGH (ref 8–23)
Calcium, Ion: 1.15 mmol/L (ref 1.15–1.40)
Chloride: 103 mmol/L (ref 98–111)
Creatinine, Ser: 2.9 mg/dL — ABNORMAL HIGH (ref 0.44–1.00)
Glucose, Bld: 81 mg/dL (ref 70–99)
HCT: 32 % — ABNORMAL LOW (ref 36.0–46.0)
Hemoglobin: 10.9 g/dL — ABNORMAL LOW (ref 12.0–15.0)
Potassium: 2.8 mmol/L — ABNORMAL LOW (ref 3.5–5.1)
Sodium: 137 mmol/L (ref 135–145)
TCO2: 21 mmol/L — ABNORMAL LOW (ref 22–32)

## 2024-02-18 LAB — CBC WITH DIFFERENTIAL/PLATELET
Abs Immature Granulocytes: 0.08 K/uL — ABNORMAL HIGH (ref 0.00–0.07)
Basophils Absolute: 0.1 K/uL (ref 0.0–0.1)
Basophils Relative: 1 %
Eosinophils Absolute: 0 K/uL (ref 0.0–0.5)
Eosinophils Relative: 0 %
HCT: 34.7 % — ABNORMAL LOW (ref 36.0–46.0)
Hemoglobin: 11.2 g/dL — ABNORMAL LOW (ref 12.0–15.0)
Immature Granulocytes: 1 %
Lymphocytes Relative: 19 %
Lymphs Abs: 1.7 K/uL (ref 0.7–4.0)
MCH: 27.1 pg (ref 26.0–34.0)
MCHC: 32.3 g/dL (ref 30.0–36.0)
MCV: 84 fL (ref 80.0–100.0)
Monocytes Absolute: 1.2 K/uL — ABNORMAL HIGH (ref 0.1–1.0)
Monocytes Relative: 14 %
Neutro Abs: 5.6 K/uL (ref 1.7–7.7)
Neutrophils Relative %: 65 %
Platelets: 271 K/uL (ref 150–400)
RBC: 4.13 MIL/uL (ref 3.87–5.11)
RDW: 15.2 % (ref 11.5–15.5)
WBC: 8.7 K/uL (ref 4.0–10.5)
nRBC: 0 % (ref 0.0–0.2)

## 2024-02-18 LAB — URINALYSIS, W/ REFLEX TO CULTURE (INFECTION SUSPECTED)
Bilirubin Urine: NEGATIVE
Glucose, UA: >=500 mg/dL — AB
Hgb urine dipstick: NEGATIVE
Ketones, ur: NEGATIVE mg/dL
Nitrite: NEGATIVE
Protein, ur: 30 mg/dL — AB
Specific Gravity, Urine: 1.007 (ref 1.005–1.030)
pH: 6 (ref 5.0–8.0)

## 2024-02-18 LAB — FERRITIN: Ferritin: 21 ng/mL (ref 11–307)

## 2024-02-18 LAB — RESP PANEL BY RT-PCR (RSV, FLU A&B, COVID)  RVPGX2
Influenza A by PCR: NEGATIVE
Influenza B by PCR: NEGATIVE
Resp Syncytial Virus by PCR: NEGATIVE
SARS Coronavirus 2 by RT PCR: NEGATIVE

## 2024-02-18 LAB — CREATININE, SERUM
Creatinine, Ser: 2.69 mg/dL — ABNORMAL HIGH (ref 0.44–1.00)
GFR, Estimated: 18 mL/min — ABNORMAL LOW (ref >60)

## 2024-02-18 LAB — TSH: TSH: 1.044 u[IU]/mL (ref 0.350–4.500)

## 2024-02-18 LAB — RETICULOCYTES
Immature Retic Fract: 8.8 % (ref 2.3–15.9)
RBC.: 3.87 MIL/uL (ref 3.87–5.11)
Retic Count, Absolute: 59.2 K/uL (ref 19.0–186.0)
Retic Ct Pct: 1.5 % (ref 0.4–3.1)

## 2024-02-18 LAB — FOLATE: Folate: 10.8 ng/mL (ref >5.9)

## 2024-02-18 LAB — I-STAT CG4 LACTIC ACID, ED
Lactic Acid, Venous: 1.4 mmol/L (ref 0.5–1.9)
Lactic Acid, Venous: 0.6 mmol/L (ref 0.5–1.9)

## 2024-02-18 LAB — BRAIN NATRIURETIC PEPTIDE: B Natriuretic Peptide: 54.3 pg/mL (ref 0.0–100.0)

## 2024-02-18 LAB — VITAMIN B12: Vitamin B-12: 327 pg/mL (ref 180–914)

## 2024-02-18 LAB — MAGNESIUM: Magnesium: 2.1 mg/dL (ref 1.7–2.4)

## 2024-02-18 MED ORDER — INSULIN ASPART 100 UNIT/ML IJ SOLN
0.0000 [IU] | Freq: Three times a day (TID) | INTRAMUSCULAR | Status: DC
  Filled 2024-02-18: qty 0.09

## 2024-02-18 MED ORDER — METOPROLOL SUCCINATE ER 50 MG PO TB24
100.0000 mg | ORAL_TABLET | Freq: Every day | ORAL | Status: DC
  Administered 2024-02-19 – 2024-02-21 (×3): 100 mg via ORAL
  Filled 2024-02-18 (×3): qty 2

## 2024-02-18 MED ORDER — PANTOPRAZOLE SODIUM 40 MG PO TBEC
40.0000 mg | DELAYED_RELEASE_TABLET | Freq: Every day | ORAL | Status: DC
  Administered 2024-02-19 – 2024-02-21 (×3): 40 mg via ORAL
  Filled 2024-02-18 (×3): qty 1

## 2024-02-18 MED ORDER — ALLOPURINOL 100 MG PO TABS
100.0000 mg | ORAL_TABLET | Freq: Every day | ORAL | Status: DC
  Administered 2024-02-19 – 2024-02-21 (×3): 100 mg via ORAL
  Filled 2024-02-18 (×3): qty 1

## 2024-02-18 MED ORDER — SODIUM CHLORIDE 0.9 % IV SOLN
1.0000 g | INTRAVENOUS | Status: DC
  Administered 2024-02-19 – 2024-02-20 (×2): 1 g via INTRAVENOUS
  Filled 2024-02-18 (×2): qty 10

## 2024-02-18 MED ORDER — TRAZODONE HCL 100 MG PO TABS: 100.0000 mg | ORAL_TABLET | Freq: Every evening | ORAL | Status: DC | PRN

## 2024-02-18 MED ORDER — ROSUVASTATIN CALCIUM 10 MG PO TABS
40.0000 mg | ORAL_TABLET | Freq: Every day | ORAL | Status: DC
  Administered 2024-02-19 – 2024-02-21 (×3): 40 mg via ORAL
  Filled 2024-02-18: qty 4
  Filled 2024-02-18: qty 2
  Filled 2024-02-18: qty 4

## 2024-02-18 MED ORDER — ALBUTEROL SULFATE HFA 108 (90 BASE) MCG/ACT IN AERS: 2.0000 | INHALATION_SPRAY | RESPIRATORY_TRACT | Status: DC | PRN

## 2024-02-18 MED ORDER — LACTATED RINGERS IV BOLUS (SEPSIS)
1000.0000 mL | Freq: Once | INTRAVENOUS | Status: AC
  Administered 2024-02-18: 1000 mL via INTRAVENOUS

## 2024-02-18 MED ORDER — SODIUM CHLORIDE 0.9 % IV SOLN
1.0000 g | Freq: Once | INTRAVENOUS | Status: AC
  Administered 2024-02-18: 1 g via INTRAVENOUS
  Filled 2024-02-18: qty 10

## 2024-02-18 MED ORDER — ALBUTEROL SULFATE (2.5 MG/3ML) 0.083% IN NEBU: 2.5000 mg | INHALATION_SOLUTION | RESPIRATORY_TRACT | Status: DC | PRN

## 2024-02-18 MED ORDER — LACTATED RINGERS IV SOLN: INTRAVENOUS | Status: DC

## 2024-02-18 MED ORDER — POTASSIUM CHLORIDE CRYS ER 20 MEQ PO TBCR
40.0000 meq | EXTENDED_RELEASE_TABLET | Freq: Once | ORAL | Status: AC
  Administered 2024-02-18: 40 meq via ORAL
  Filled 2024-02-18: qty 2

## 2024-02-18 MED ORDER — FAMOTIDINE 20 MG PO TABS
20.0000 mg | ORAL_TABLET | Freq: Two times a day (BID) | ORAL | Status: DC
  Administered 2024-02-18 – 2024-02-19 (×2): 20 mg via ORAL
  Filled 2024-02-18 (×2): qty 1

## 2024-02-18 MED ORDER — BUDESON-GLYCOPYRROL-FORMOTEROL 160-9-4.8 MCG/ACT IN AERO
2.0000 | INHALATION_SPRAY | Freq: Two times a day (BID) | RESPIRATORY_TRACT | Status: DC
  Administered 2024-02-20 – 2024-02-21 (×3): 2 via RESPIRATORY_TRACT
  Filled 2024-02-18: qty 5.9

## 2024-02-18 MED ORDER — HEPARIN SODIUM (PORCINE) 5000 UNIT/ML IJ SOLN
5000.0000 [IU] | Freq: Three times a day (TID) | INTRAMUSCULAR | Status: DC
  Administered 2024-02-18 – 2024-02-21 (×7): 5000 [IU] via SUBCUTANEOUS
  Filled 2024-02-18 (×8): qty 1

## 2024-02-18 MED ORDER — DILTIAZEM HCL ER COATED BEADS 240 MG PO CP24
240.0000 mg | ORAL_CAPSULE | Freq: Every day | ORAL | Status: DC
  Administered 2024-02-19 – 2024-02-21 (×3): 240 mg via ORAL
  Filled 2024-02-18 (×3): qty 1

## 2024-02-18 MED ORDER — POTASSIUM CHLORIDE 20 MEQ PO PACK
40.0000 meq | PACK | Freq: Once | ORAL | Status: AC
  Administered 2024-02-18: 40 meq via ORAL
  Filled 2024-02-18: qty 2

## 2024-02-18 MED ORDER — DORZOLAMIDE HCL 2 % OP SOLN: 1.0000 [drp] | Freq: Three times a day (TID) | OPHTHALMIC | Status: DC

## 2024-02-18 MED ORDER — LATANOPROST 0.005 % OP SOLN
1.0000 [drp] | Freq: Every day | OPHTHALMIC | Status: DC
  Administered 2024-02-19 – 2024-02-20 (×2): 1 [drp] via OPHTHALMIC
  Filled 2024-02-18: qty 2.5

## 2024-02-18 MED ORDER — BRIMONIDINE TARTRATE 0.2 % OP SOLN
1.0000 [drp] | Freq: Three times a day (TID) | OPHTHALMIC | Status: DC
  Administered 2024-02-19 – 2024-02-21 (×6): 1 [drp] via OPHTHALMIC
  Filled 2024-02-18 (×2): qty 5

## 2024-02-18 MED ORDER — CITALOPRAM HYDROBROMIDE 20 MG PO TABS
40.0000 mg | ORAL_TABLET | Freq: Every day | ORAL | Status: DC
  Administered 2024-02-19 – 2024-02-21 (×3): 40 mg via ORAL
  Filled 2024-02-18 (×2): qty 2
  Filled 2024-02-18: qty 4

## 2024-02-18 NOTE — H&P (Signed)
 History and Physical    Brooke [name] FMW:[id number] DOB: [birth year] DOA: 02/18/2024  Patient coming from: Home.  Chief Complaint: Weakness.  HPI: Brooke [name] is a 73 y.o. female with history of COPD, ongoing tobacco abuse, and diabetes mellitus type 2, hypertension, chronic kidney disease stage III, gout, hyperlipidemia, GERD was brought to the ER after patient was complaining increasing weakness and dizziness.  As per the son with whom I spoke and patient lives with patient has been feeling dizzy and weakness for the last few weeks she almost passed out once when she was trying to ambulate.  Did not complain of any chest pain or shortness of breath.  Patient states she has been having poor appetite for last few weeks with some nausea one episode of vomiting denies any diarrhea.  Denies any abdominal pain fever or chills.  No recent change in medications.  Patient has been on her Mounjaro  for more than a year now.  Per report EMS on arrival at home found patient was hypotensive was given fluid bolus.  ED Course: The ER patient was normotensive.  EKG shows normal sinus rhythm with LBBB.  Troponins were negative patient did not complain of any chest pain or shortness of breath.  Labs show hypokalemia hyponatremia with acute renal failure with creatinine of 2.9 which has worsened from 1.5 in March 2025.  Patient was given fluid bolus and admitted for acute renal failure with weakness.  Review of Systems: As per HPI, rest all negative.   Past Medical History:  Diagnosis Date   Alopecia    Anxiety    Aortic atherosclerosis (HCC)    Asthma    FOLLOWED BY PCP   Eczema    GERD (gastroesophageal reflux disease)    Gout    04-28-2018--- per pt stable , as been a while since last episode   Heart murmur    History of colon polyps    History of syncope 2015   Hyperlipidemia    Hypertension    Hypothyroidism    OA (osteoarthritis) of knee    bilateral   PVC's (premature ventricular  contractions)    Toxic multinodular goiter    03/ 2003  s/p RAI   Type 2 diabetes mellitus (HCC)    followed by pcp   Ventricular tachycardia, polymorphic (HCC) 01/04/2014   primary cardiologist-- dr wilbert turner (hx monitor 2015 showed couplet PVCs, as trigger)   Wears glasses    Wears partial dentures    upper    Past Surgical History:  Procedure Laterality Date   ABDOMINAL HYSTERECTOMY  10/22/1999   WITH BSO   CATARACT EXTRACTION W/ INTRAOCULAR LENS IMPLANT Left YRS AGO   COLONOSCOPY     EXCISION ABDOMINAL WALL MASS  12-20-2005   dr merrilyn @[facility]    neruofibroma   LAPAROSCOPIC CHOLECYSTECTOMY  10/01/2002   dr merrilyn @[facility]    LEFT HEART CATHETERIZATION WITH CORONARY ANGIOGRAM N/A 01/07/2014   Procedure: LEFT HEART CATHETERIZATION WITH CORONARY ANGIOGRAM;  Surgeon: Peter M Swaziland, MD;  Location: [facility] CATH LAB;  Service: Cardiovascular;  Laterality: N/A;   RASTELLI PROCEDURE  6/98 neg   RENAL ARTERY STENT Left 11/2003   angioplasty and stenting   RENAL ARTERY STENT Left 02/2005   re-stenting   TOTAL KNEE ARTHROPLASTY Right 05/05/2018   Procedure: RIGHT TOTAL KNEE ARTHROPLASTY;  Surgeon: Liam Lerner, MD;  Location: [facility];  Service: Orthopedics;  Laterality: Right;   TOTAL KNEE ARTHROPLASTY Left 09/30/2018   Procedure: TOTAL KNEE ARTHROPLASTY;  Surgeon:  Dalldorf, Peter, MD;  Location: [facility];  Service: Orthopedics;  Laterality: Left;     reports that she has never smoked. She has never been exposed to tobacco smoke. She has never used smokeless tobacco. She reports that she does not drink alcohol and does not use drugs.  Allergies  Allergen Reactions   Metformin  And Related Nausea And Vomiting   Ace Inhibitors Swelling        Codeine Nausea And Vomiting and Rash   Hydrocodone  Nausea And Vomiting   Tramadol  Nausea And Vomiting   Tylenol  [Acetaminophen ] Itching and Rash    Family History  Problem Relation Age of Onset   Diabetes Mother    Heart disease Father    Colon cancer  Neg Hx    Esophageal cancer Neg Hx    Rectal cancer Neg Hx    Stomach cancer Neg Hx    BRCA 1/2 Neg Hx    Breast cancer Neg Hx     Prior to Admission medications   Medication Sig Start Date End Date Taking? Authorizing Provider  acetaminophen  (TYLENOL ) 325 MG tablet Take 650 mg by mouth every 6 (six) hours as needed for mild pain.    Norleen Lynwood ORN, MD  acetaZOLAMIDE (DIAMOX) 250 MG tablet Take by mouth. 08/21/22   [provider]  albuterol  (VENTOLIN  HFA) 108 (90 Base) MCG/ACT inhaler Inhale 2 puffs into the lungs every 4 (four) hours as needed for wheezing or shortness of breath. 11/27/23   Jeneal Danita Macintosh, MD  allopurinol  (ZYLOPRIM ) 100 MG tablet TAKE 1 TABLET BY MOUTH EVERY DAY 07/08/23   Norleen Lynwood ORN, MD  azelastine  (ASTELIN ) 0.1 % nasal spray Place 2 sprays into both nostrils 2 (two) times daily. Use in each nostril as directed 02/25/23   Norleen Lynwood ORN, MD  brimonidine  (ALPHAGAN ) 0.2 % ophthalmic solution 3 (three) times daily. 1 drop in the left eye three times daily    [provider]  cetirizine  (ZYRTEC ) 10 MG tablet Take 1 tablet (10 mg total) by mouth daily. 01/03/24   Jeneal Danita Macintosh, MD  citalopram  (CELEXA ) 40 MG tablet TAKE 1 TABLET BY MOUTH EVERY DAY 05/01/23   Norleen Lynwood ORN, MD  clonazePAM  (KLONOPIN ) 0.5 MG tablet Take 1 tablet (0.5 mg total) by mouth daily as needed (vertigo). 02/25/23   Norleen Lynwood ORN, MD  colchicine  0.6 MG tablet TAKE 0.5 TABLETS (0.3 MG TOTAL) BY MOUTH DAILY AS NEEDED (GOUT OR PSUEDOGOUT PAIN). 10/01/22   Joane Artist RAMAN, MD  diltiazem  (CARDIZEM  CD) 240 MG 24 hr capsule Take 1 capsule (240 mg total) by mouth daily. 10/02/23   Norleen Lynwood ORN, MD  dorzolamide  (TRUSOPT ) 2 % ophthalmic solution Place 1 drop into the left eye 3 (three) times daily.    [provider]  famotidine  (PEPCID ) 20 MG tablet Take 1 tablet (20 mg total) by mouth 2 (two) times daily. 11/27/23   Jeneal Danita Macintosh, MD  fexofenadine  (ALLEGRA ) 180 MG  tablet Take 1 tablet (180 mg total) by mouth daily. 02/25/23   Norleen Lynwood ORN, MD  Fluticasone -Umeclidin-Vilant (TRELEGY ELLIPTA ) 200-62.5-25 MCG/ACT AEPB Inhale 1 puff into the lungs daily. 11/27/23   Jeneal Danita Macintosh, MD  hydrochlorothiazide  (MICROZIDE ) 12.5 MG capsule Take 1 capsule (12.5 mg total) by mouth daily. 10/02/23   Norleen Lynwood ORN, MD  JARDIANCE  25 MG TABS tablet TAKE 1 TABLET BY MOUTH EVERY DAY BEFORE BREAKFAST 11/26/23   Norleen Lynwood ORN, MD  latanoprost  (XALATAN ) 0.005 % ophthalmic solution Place 1  drop into the left eye at bedtime.    [provider]  meclizine  (ANTIVERT ) 25 MG tablet TAKE 1 TABLET BY MOUTH 3 TIMES A DAY AS NEEDED FOR DIZZINESS 11/04/23   Norleen Lynwood ORN, MD  metoprolol  succinate (TOPROL -XL) 100 MG 24 hr tablet TAKE 1 TABLET BY MOUTH 2 (TWO) TIMES DAILY. TAKE WITH OR IMMEDIATELY FOLLOWING A MEAL. 07/19/23   Shlomo Wilbert SAUNDERS, MD  metoprolol  succinate (TOPROL -XL) 25 MG 24 hr tablet TAKE 1 TABLET BY MOUTH IN THE MORNING AND AT BEDTIME. TAKE THIS WITH 100 MG TABLET DAILY. 07/08/23   Shlomo Wilbert SAUNDERS, MD  omeprazole  (PRILOSEC) 40 MG capsule Take 1 capsule (40 mg total) by mouth daily. 12/05/21   Norleen Lynwood ORN, MD  oxybutynin  (DITROPAN ) 5 MG tablet TAKE 1 TABLET (5 MG TOTAL) BY MOUTH DAILY AT 10 PM. 02/05/22   Norleen Lynwood ORN, MD  potassium chloride  SA (KLOR-CON  M) 20 MEQ tablet Take 1 tablet (20 mEq total) by mouth 2 (two) times daily for 5 days. 04/26/22 09/05/22  Randol Simmonds, MD  rosuvastatin  (CRESTOR ) 40 MG tablet TAKE 1 TABLET BY MOUTH EVERY DAY 04/03/23   Shlomo Wilbert SAUNDERS, MD  telmisartan  (MICARDIS ) 80 MG tablet Take 80 mg by mouth daily. 09/13/21   [provider]  tirzepatide  (MOUNJARO ) 2.5 MG/0.5ML Pen Inject 2.5 mg into the skin once a week. 01/09/24   Norleen Lynwood ORN, MD  traZODone  (DESYREL ) 100 MG tablet Take 1 tablet (100 mg total) by mouth at bedtime as needed. for sleep 09/24/23   Norleen Lynwood ORN, MD    Physical Exam: Constitutional: Moderately built and  nourished. Vitals:   02/18/24 1757 02/18/24 1759 02/18/24 2030  BP:  120/69 (!) 144/59  Pulse:  66 70  Resp:  13 17  Temp:  98.1 F (36.7 C)   TempSrc:  Oral   SpO2:  99% 98%  Weight: 83 kg    Height: 5' 3 (1.6 m)     Eyes: Anicteric no pallor. ENMT: No discharge from the ears/nose or mouth. Neck: No mass felt.  No neck rigidity. Respiratory: No rhonchi or crepitations. Cardiovascular: S1-S2 heard. Abdomen: Soft nontender bowel sounds present. Musculoskeletal: No edema. Skin: No rash. Neurologic: Alert awake oriented to time place and person.  Moving all extremities 5 x 5. Psychiatric: Appears normal.  Normal affect.   Labs on Admission: I have personally reviewed following labs and imaging studies  CBC: Recent Labs  Lab 02/18/24 1811 02/18/24 1826  WBC 8.7  --   NEUTROABS 5.6  --   HGB 11.2* 10.9*  HCT 34.7* 32.0*  MCV 84.0  --   PLT 271  --    Basic Metabolic Panel: Recent Labs  Lab 02/18/24 1811 02/18/24 1826  NA 134* 137  K 2.7* 2.8*  CL 101 103  CO2 24  --   GLUCOSE 91 81  BUN 37* 36*  CREATININE 2.84* 2.90*  CALCIUM  8.5*  --   MG 2.1  --    GFR: Estimated Creatinine Clearance: 17.9 mL/min (A) (by C-G formula based on SCr of 2.9 mg/dL (H)). Liver Function Tests: Recent Labs  Lab 02/18/24 1811  AST 30  ALT 26  ALKPHOS 122  BILITOT 0.8  PROT 6.8  ALBUMIN 3.0*   No results for input(s): LIPASE, AMYLASE in the last 168 hours. No results for input(s): AMMONIA in the last 168 hours. Coagulation Profile: No results for input(s): INR, PROTIME in the last 168 hours. Cardiac Enzymes: No results for input(s):  CKTOTAL, CKMB, CKMBINDEX, TROPONINI in the last 168 hours. BNP (last 3 results) No results for input(s): PROBNP in the last 8760 hours. HbA1C: No results for input(s): HGBA1C in the last 72 hours. CBG: No results for input(s): GLUCAP in the last 168 hours. Lipid Profile: No results for input(s): CHOL, HDL,  LDLCALC, TRIG, CHOLHDL, LDLDIRECT in the last 72 hours. Thyroid  Function Tests: No results for input(s): TSH, T4TOTAL, FREET4, T3FREE, THYROIDAB in the last 72 hours. Anemia Panel: No results for input(s): VITAMINB12, FOLATE, FERRITIN, TIBC, IRON, RETICCTPCT in the last 72 hours. Urine analysis:    Component Value Date/Time   COLORURINE YELLOW 02/18/2024 2017   APPEARANCEUR CLEAR 02/18/2024 2017   LABSPEC 1.007 02/18/2024 2017   PHURINE 6.0 02/18/2024 2017   GLUCOSEU >=500 (A) 02/18/2024 2017   GLUCOSEU 500 (A) 09/06/2022 1011   HGBUR NEGATIVE 02/18/2024 2017   BILIRUBINUR NEGATIVE 02/18/2024 2017   BILIRUBINUR neg 09/12/2016 0954   KETONESUR NEGATIVE 02/18/2024 2017   PROTEINUR 30 (A) 02/18/2024 2017   UROBILINOGEN 0.2 09/06/2022 1011   NITRITE NEGATIVE 02/18/2024 2017   LEUKOCYTESUR SMALL (A) 02/18/2024 2017   Sepsis Labs: @LABRCNTIP (procalcitonin:4,lacticidven:4) ) Recent Results (from the past 240 hours)  Resp panel by RT-PCR (RSV, Flu A&B, Covid) Anterior Nasal Swab     Status: None   Collection Time: 02/18/24  6:11 PM   Specimen: Anterior Nasal Swab  Result Value Ref Range Status   SARS Coronavirus 2 by RT PCR NEGATIVE NEGATIVE Final    Comment: (NOTE) SARS-CoV-2 target nucleic acids are NOT DETECTED.  The SARS-CoV-2 RNA is generally detectable in upper respiratory specimens during the acute phase of infection. The lowest concentration of SARS-CoV-2 viral copies this assay can detect is 138 copies/mL. A negative result does not preclude SARS-Cov-2 infection and should not be used as the sole basis for treatment or other patient management decisions. A negative result may occur with  improper specimen collection/handling, submission of specimen other than nasopharyngeal swab, presence of viral mutation(s) within the areas targeted by this assay, and inadequate number of viral copies(<138 copies/mL). A negative result must be combined  with clinical observations, patient history, and epidemiological information. The expected result is Negative.  Fact Sheet for Patients:  BloggerCourse.com  Fact Sheet for Healthcare Providers:  SeriousBroker.it  This test is no t yet approved or cleared by the [place]  FDA and  has been authorized for detection and/or diagnosis of SARS-CoV-2 by FDA under an Emergency Use Authorization (EUA). This EUA will remain  in effect (meaning this test can be used) for the duration of the COVID-19 declaration under Section 564(b)(1) of the Act, 21 U.S.C.section 360bbb-3(b)(1), unless the authorization is terminated  or revoked sooner.       Influenza A by PCR NEGATIVE NEGATIVE Final   Influenza B by PCR NEGATIVE NEGATIVE Final    Comment: (NOTE) The Xpert Xpress SARS-CoV-2/FLU/RSV plus assay is intended as an aid in the diagnosis of influenza from Nasopharyngeal swab specimens and should not be used as a sole basis for treatment. Nasal washings and aspirates are unacceptable for Xpert Xpress SARS-CoV-2/FLU/RSV testing.  Fact Sheet for Patients: BloggerCourse.com  Fact Sheet for Healthcare Providers: SeriousBroker.it  This test is not yet approved or cleared by the [place]  FDA and has been authorized for detection and/or diagnosis of SARS-CoV-2 by FDA under an Emergency Use Authorization (EUA). This EUA will remain in effect (meaning this test can be used) for the duration of the COVID-19 declaration under Section 564(b)(1)  of the Act, 21 U.S.C. section 360bbb-3(b)(1), unless the authorization is terminated or revoked.     Resp Syncytial Virus by PCR NEGATIVE NEGATIVE Final    Comment: (NOTE) Fact Sheet for Patients: BloggerCourse.com  Fact Sheet for Healthcare Providers: SeriousBroker.it  This test is not yet approved  or cleared by the [place]  FDA and has been authorized for detection and/or diagnosis of SARS-CoV-2 by FDA under an Emergency Use Authorization (EUA). This EUA will remain in effect (meaning this test can be used) for the duration of the COVID-19 declaration under Section 564(b)(1) of the Act, 21 U.S.C. section 360bbb-3(b)(1), unless the authorization is terminated or revoked.  Performed at [facility], [address], [place], [place] [address]      Radiological Exams on Admission: DG Chest Port 1 View Result Date: 02/18/2024 CLINICAL DATA:  Questionable sepsis EXAM: PORTABLE CHEST 1 VIEW COMPARISON:  Chest x-ray 06/06/2023 FINDINGS: The heart size and mediastinal contours are within normal limits. Both lungs are clear. The visualized skeletal structures are unremarkable. IMPRESSION: No active disease. Electronically Signed   By: Greig Pique M.D.   On: 02/18/2024 18:51    EKG: Independently reviewed.  Normal sinus rhythm LBBB.  Assessment/Plan Principal Problem:   ARF (acute renal failure) (HCC) Active Problems:   Hyperlipidemia   Gout   Normochromic normocytic anemia   Essential hypertension   Asthma   CKD (chronic kidney disease) stage 3, GFR 30-59 ml/min (HCC)   COPD (chronic obstructive pulmonary disease) (HCC)    Acute on chronic kidney disease stage III with hyponatremia and hypokalemia-    suspect likely prerenal with poor oral intake.  In addition patient being on ARB diuretics and also was mildly hypotensive.  CT renal studies pending to rule out any obstruction.  Will continue with hydration.  UA shows mild proteinuria.  Will hold patient's ARB and hydrochlorothiazide  for now.  Follow metabolic panel.  I think hyponatremia will improve with hydration and for hypokalemia which could be from poor oral intake and also from diuretics will replace and recheck.  Check magnesium. Dizziness and weakness could be from dehydration and deconditioning.   Hydrate.  Check orthostatics in the morning check physical therapy consult.  Check CK levels and TSH. Poor appetite with nausea CT abdomen pelvis is pending.  Will closely observe. Anemia appears to be chronic likely from renal disease.  Check anemia panel follow CBC. Diabetes mellitus type 2 last hemoglobin A1c was 6.1 about 4 months ago.  Takes Mounjaro  and Jardiance .  Presently on sliding scale coverage. Hypertension since patient was hypotensive at presentation but improved with fluids will hold ARB and hydrochlorothiazide  for now and continue Cardizem  and metoprolol .  Per patient's son with whom I reviewed medicines patient takes metoprolol  twice a day but I have placed patient for now on once a day dose. History of gout on allopurinol . COPD not actively wheezing continue trilogy. Hyperlipidemia on statins check CK levels. Cognitive issues per patient's son.  Patient's son noticed that patient has a and some difficulty with her memory recently.  Will check B12 TSH RPR ammonia and CT head. Abnormal UA with no significant signs of any urinary symptoms at this time.  Check cultures.  Follow CT renal pelvis.  Since patient has acute renal failure with initial presentation of hypotension with dehydration dizziness weakness will need further workup close monitoring and more than 2 midnight stay.   DVT prophylaxis: Heparin . Code Status: Full code. Family Communication: Patient's son and daughter-in-law. Disposition  Plan: Monitored bed. Consults called: Physical therapy. Admission status: Observation.

## 2024-02-18 NOTE — ED Triage Notes (Signed)
 Pt bib [facility] from home states has increased weakness. EMS initial bp was 80/- then of fluid 110s systolic

## 2024-02-18 NOTE — ED Provider Notes (Signed)
 Brooke [name]   CSN: [id number] Arrival date & time: 02/18/24  1739     Patient presents with: No chief complaint on file.   Brooke [name] is a 73 y.o. female.   HPI Patient presents for generalized weakness.  Medical history includes DM, arthritis, HTN, HLD, GERD, asthma, anxiety, gout.  She states that she has felt generalized weakness for the past 2 months.  This worsened over the past 2 to 3 days.  She does continue to take blood pressure medicines.  Typically these are taken in the morning and evening.  She did take her evening doses today.  EMS noted initial SBP's in the 80s.  She was given 250 cc IVF with improvement of blood pressure to the 100s.  Currently, patient denies any areas of pain.  She states that she has had a poor appetite and poor p.o. intake lately.  She does have intermittent nausea and vomiting.  She denies any blood loss or diarrhea.    Prior to Admission medications   Medication Sig Start Date End Date Taking? Authorizing Provider  acetaminophen  (TYLENOL ) 325 MG tablet Take 650 mg by mouth every 6 (six) hours as needed for mild pain.    Norleen Lynwood ORN, MD  acetaZOLAMIDE (DIAMOX) 250 MG tablet Take by mouth. 08/21/22   [provider]  albuterol  (VENTOLIN  HFA) 108 (90 Base) MCG/ACT inhaler Inhale 2 puffs into the lungs every 4 (four) hours as needed for wheezing or shortness of breath. 11/27/23   Jeneal Danita Macintosh, MD  allopurinol  (ZYLOPRIM ) 100 MG tablet TAKE 1 TABLET BY MOUTH EVERY DAY 07/08/23   Norleen Lynwood ORN, MD  azelastine  (ASTELIN ) 0.1 % nasal spray Place 2 sprays into both nostrils 2 (two) times daily. Use in each nostril as directed 02/25/23   Norleen Lynwood ORN, MD  brimonidine  (ALPHAGAN ) 0.2 % ophthalmic solution 3 (three) times daily. 1 drop in the left eye three times daily    [provider]  cetirizine  (ZYRTEC ) 10 MG tablet Take 1 tablet (10 mg total) by mouth daily. 01/03/24    Jeneal Danita Macintosh, MD  citalopram  (CELEXA ) 40 MG tablet TAKE 1 TABLET BY MOUTH EVERY DAY 05/01/23   Norleen Lynwood ORN, MD  clonazePAM  (KLONOPIN ) 0.5 MG tablet Take 1 tablet (0.5 mg total) by mouth daily as needed (vertigo). 02/25/23   Norleen Lynwood ORN, MD  colchicine  0.6 MG tablet TAKE 0.5 TABLETS (0.3 MG TOTAL) BY MOUTH DAILY AS NEEDED (GOUT OR PSUEDOGOUT PAIN). 10/01/22   Joane Artist RAMAN, MD  diltiazem  (CARDIZEM  CD) 240 MG 24 hr capsule Take 1 capsule (240 mg total) by mouth daily. 10/02/23   Norleen Lynwood ORN, MD  dorzolamide  (TRUSOPT ) 2 % ophthalmic solution Place 1 drop into the left eye 3 (three) times daily.    [provider]  famotidine  (PEPCID ) 20 MG tablet Take 1 tablet (20 mg total) by mouth 2 (two) times daily. 11/27/23   Jeneal Danita Macintosh, MD  fexofenadine  (ALLEGRA ) 180 MG tablet Take 1 tablet (180 mg total) by mouth daily. 02/25/23   Norleen Lynwood ORN, MD  Fluticasone -Umeclidin-Vilant (TRELEGY ELLIPTA ) 200-62.5-25 MCG/ACT AEPB Inhale 1 puff into the lungs daily. 11/27/23   Jeneal Danita Macintosh, MD  hydrochlorothiazide  (MICROZIDE ) 12.5 MG capsule Take 1 capsule (12.5 mg total) by mouth daily. 10/02/23   Norleen Lynwood ORN, MD  JARDIANCE  25 MG TABS tablet TAKE 1 TABLET BY MOUTH EVERY DAY BEFORE BREAKFAST 11/26/23   Norleen Lynwood  W, MD  latanoprost  (XALATAN ) 0.005 % ophthalmic solution Place 1 drop into the left eye at bedtime.    [provider]  meclizine  (ANTIVERT ) 25 MG tablet TAKE 1 TABLET BY MOUTH 3 TIMES A DAY AS NEEDED FOR DIZZINESS 11/04/23   Norleen Lynwood ORN, MD  metoprolol  succinate (TOPROL -XL) 100 MG 24 hr tablet TAKE 1 TABLET BY MOUTH 2 (TWO) TIMES DAILY. TAKE WITH OR IMMEDIATELY FOLLOWING A MEAL. 07/19/23   Shlomo Wilbert SAUNDERS, MD  metoprolol  succinate (TOPROL -XL) 25 MG 24 hr tablet TAKE 1 TABLET BY MOUTH IN THE MORNING AND AT BEDTIME. TAKE THIS WITH 100 MG TABLET DAILY. 07/08/23   Shlomo Wilbert SAUNDERS, MD  omeprazole  (PRILOSEC) 40 MG capsule Take 1 capsule (40 mg total) by mouth daily.  12/05/21   Norleen Lynwood ORN, MD  oxybutynin  (DITROPAN ) 5 MG tablet TAKE 1 TABLET (5 MG TOTAL) BY MOUTH DAILY AT 10 PM. 02/05/22   Norleen Lynwood ORN, MD  potassium chloride  SA (KLOR-CON  M) 20 MEQ tablet Take 1 tablet (20 mEq total) by mouth 2 (two) times daily for 5 days. 04/26/22 09/05/22  Randol Simmonds, MD  rosuvastatin  (CRESTOR ) 40 MG tablet TAKE 1 TABLET BY MOUTH EVERY DAY 04/03/23   Shlomo Wilbert SAUNDERS, MD  telmisartan  (MICARDIS ) 80 MG tablet Take 80 mg by mouth daily. 09/13/21   [provider]  tirzepatide  (MOUNJARO ) 2.5 MG/0.5ML Pen Inject 2.5 mg into the skin once a week. 01/09/24   Norleen Lynwood ORN, MD  traZODone  (DESYREL ) 100 MG tablet Take 1 tablet (100 mg total) by mouth at bedtime as needed. for sleep 09/24/23   Norleen Lynwood ORN, MD    Allergies: Metformin  and related, Ace inhibitors, Codeine, Hydrocodone , Tramadol , and Tylenol  [acetaminophen ]    Review of Systems  Constitutional:  Positive for activity change, appetite change and fatigue.  Gastrointestinal:  Positive for nausea and vomiting.  Neurological:  Positive for weakness (Generalized).  All other systems reviewed and are negative.   Updated Vital Signs BP (!) 144/59   Pulse 70   Temp 98.1 F (36.7 C) (Oral)   Resp 17   Ht 5' 3 (1.6 m)   Wt 83 kg   SpO2 98%   BMI 32.42 kg/m   Physical Exam Vitals and nursing [name] reviewed.  Constitutional:      General: She is not in acute distress.    Appearance: Normal appearance. She is well-developed. She is not ill-appearing, toxic-appearing or diaphoretic.  HENT:     Head: Normocephalic and atraumatic.     Right Ear: External ear normal.     Left Ear: External ear normal.     Nose: Nose normal.     Mouth/Throat:     Mouth: Mucous membranes are moist.  Eyes:     Extraocular Movements: Extraocular movements intact.     Conjunctiva/sclera: Conjunctivae normal.  Cardiovascular:     Rate and Rhythm: Normal rate and regular rhythm.     Heart sounds: No murmur heard. Pulmonary:      Effort: Pulmonary effort is normal. No respiratory distress.     Breath sounds: Normal breath sounds.  Chest:     Chest wall: No tenderness.  Abdominal:     General: There is no distension.     Palpations: Abdomen is soft.     Tenderness: There is no abdominal tenderness.  Musculoskeletal:        General: No swelling.     Cervical back: Normal range of motion and neck supple.  Skin:  General: Skin is warm and dry.     Coloration: Skin is not jaundiced or pale.  Neurological:     General: No focal deficit present.     Mental Status: She is alert and oriented to person, place, and time.  Psychiatric:        Mood and Affect: Mood normal.        Behavior: Behavior normal.     (all labs ordered are listed, but only abnormal results are displayed) Labs Reviewed  COMPREHENSIVE METABOLIC PANEL WITH GFR - Abnormal; Notable for the following components:      Result Value   Sodium 134 (*)    Potassium 2.7 (*)    BUN 37 (*)    Creatinine, Ser 2.84 (*)    Calcium  8.5 (*)    Albumin 3.0 (*)    GFR, Estimated 17 (*)    All other components within normal limits  CBC WITH DIFFERENTIAL/PLATELET - Abnormal; Notable for the following components:   Hemoglobin 11.2 (*)    HCT 34.7 (*)    Monocytes Absolute 1.2 (*)    Abs Immature Granulocytes 0.08 (*)    All other components within normal limits  BLOOD GAS, VENOUS - Abnormal; Notable for the following components:   pCO2, Ven 40 (*)    All other components within normal limits  URINALYSIS, W/ REFLEX TO CULTURE (INFECTION SUSPECTED) - Abnormal; Notable for the following components:   Glucose, UA >=500 (*)    Protein, ur 30 (*)    Leukocytes,Ua SMALL (*)    Bacteria, UA MANY (*)    All other components within normal limits  I-STAT CHEM 8, ED - Abnormal; Notable for the following components:   Potassium 2.8 (*)    BUN 36 (*)    Creatinine, Ser 2.90 (*)    TCO2 21 (*)    Hemoglobin 10.9 (*)    HCT 32.0 (*)    All other components  within normal limits  RESP PANEL BY RT-PCR (RSV, FLU A&B, COVID)  RVPGX2  CULTURE, BLOOD (ROUTINE X 2)  CULTURE, BLOOD (ROUTINE X 2)  URINE CULTURE  BRAIN NATRIURETIC PEPTIDE  MAGNESIUM  TSH  CBC  CREATININE, SERUM  COMPREHENSIVE METABOLIC PANEL WITH GFR  CBC  VITAMIN B12  FOLATE  IRON AND TIBC  FERRITIN  RETICULOCYTES  I-STAT CG4 LACTIC ACID, ED  I-STAT CG4 LACTIC ACID, ED  TROPONIN I (HIGH SENSITIVITY)  TROPONIN I (HIGH SENSITIVITY)    EKG: EKG Interpretation Date/Time:  Tuesday February 18 2024 17:54:55 EDT Ventricular Rate:  66 PR Interval:  217 QRS Duration:  144 QT Interval:  478 QTC Calculation: 501 R Axis:   -46  Text Interpretation: Sinus rhythm Borderline prolonged PR interval Left bundle branch block Confirmed by Melvenia Motto [phone]) on 02/18/2024 6:51:14 PM  Radiology: [facility] Chest Port 1 View Result Date: 02/18/2024 CLINICAL DATA:  Questionable sepsis EXAM: PORTABLE CHEST 1 VIEW COMPARISON:  Chest x-ray 06/06/2023 FINDINGS: The heart size and mediastinal contours are within normal limits. Both lungs are clear. The visualized skeletal structures are unremarkable. IMPRESSION: No active disease. Electronically Signed   By: Greig Pique M.D.   On: 02/18/2024 18:51     Procedures   Medications Ordered in the ED  allopurinol  (ZYLOPRIM ) tablet 100 mg (has no administration in time range)  rosuvastatin  (CRESTOR ) tablet 40 mg (has no administration in time range)  citalopram  (CELEXA ) tablet 40 mg (has no administration in time range)  famotidine  (PEPCID ) tablet 20 mg (has no administration  in time range)  pantoprazole  (PROTONIX ) EC tablet 40 mg (has no administration in time range)  albuterol  (VENTOLIN  HFA) 108 (90 Base) MCG/ACT inhaler 2 puff (has no administration in time range)  budesonide -glycopyrrolate -formoterol  (BREZTRI ) 160-9-4.8 MCG/ACT inhaler 2 puff (has no administration in time range)  brimonidine  (ALPHAGAN ) 0.2 % ophthalmic solution 1 drop (has no  administration in time range)  dorzolamide  (TRUSOPT ) 2 % ophthalmic solution 1 drop (has no administration in time range)  latanoprost  (XALATAN ) 0.005 % ophthalmic solution 1 drop (has no administration in time range)  insulin  aspart (novoLOG ) injection 0-9 Units (has no administration in time range)  heparin  injection 5,000 Units (has no administration in time range)  lactated ringers  infusion ( Intravenous New Bag/Given 02/18/24 2122)  diltiazem  (CARDIZEM  CD) 24 hr capsule 240 mg (has no administration in time range)  metoprolol  succinate (TOPROL -XL) 24 hr tablet 100 mg (has no administration in time range)  cefTRIAXone  (ROCEPHIN ) 1 g in sodium chloride  0.9 % 100 mL IVPB (has no administration in time range)  lactated ringers  bolus 1,000 mL (0 mLs Intravenous Stopped 02/18/24 2001)  potassium chloride  SA (KLOR-CON  M) CR tablet 40 mEq (40 mEq Oral Given 02/18/24 2024)                                    Medical Decision Making Amount and/or Complexity of Data Reviewed Labs: ordered. Radiology: ordered.  Risk Prescription drug management. Decision regarding hospitalization.   This patient presents to the ED for concern of generalized weakness, this involves an extensive number of treatment options, and is a complaint that carries with it a high risk of complications and morbidity.  The differential diagnosis includes dehydration, deconditioning, anemia, metabolic derangements, infection, polypharmacy   Co morbidities / Chronic conditions that complicate the patient evaluation  DM, arthritis, HTN, HLD, GERD, asthma, anxiety, gout   Additional history obtained:  Additional history obtained from EMR External records from outside source obtained and reviewed including N/A   Lab Tests:  I Ordered, and personally interpreted labs.  The pertinent results include: AKI, hypokalemia, and UTI are present.  Lab work is otherwise unremarkable.   Imaging Studies ordered:  I ordered  imaging studies including chest x-ray I independently visualized and interpreted imaging which showed no acute findings I agree with the radiologist interpretation   Cardiac Monitoring: / EKG:  The patient was maintained on a cardiac monitor.  I personally viewed and interpreted the cardiac monitored which showed an underlying rhythm of: Sinus rhythm   Problem List / ED Course / Critical interventions / Medication management  Patient presenting for generalized weakness.  Found to hypotensive with EMS.  Blood pressure improved following 250 cc of IVF.  On arrival, she is overall well-appearing.  Current vital signs are normal.  She denies any areas of discomfort.  She has had some recent nausea and vomiting in addition to poor p.o. intake.  She is on blood pressure medications but last doses were this morning.  Workup was initiated.  Additional IV fluids were ordered.  Lab work shows AKI.  Patient remains normotensive.  Urinalysis suggest UTI.  Antibiotics were ordered.  Patient was admitted for further management. I ordered medication including IV fluids for hydration, potassium chloride  for hypokalemia, ceftriaxone  for UTI Reevaluation of the patient after these medicines showed that the patient improved I have reviewed the patients home medicines and have made adjustments as needed   Social  Determinants of Health:  Lives independently     Final diagnoses:  AKI (acute kidney injury) (HCC)  Generalized weakness  Acute cystitis without hematuria    ED Discharge Orders     None          Melvenia Motto, MD 02/18/24 2137

## 2024-02-19 ENCOUNTER — Other Ambulatory Visit: Payer: Self-pay

## 2024-02-19 ENCOUNTER — Ambulatory Visit: Admitting: Internal Medicine

## 2024-02-19 DIAGNOSIS — N179 Acute kidney failure, unspecified: Secondary | ICD-10-CM | POA: Diagnosis not present

## 2024-02-19 DIAGNOSIS — E7800 Pure hypercholesterolemia, unspecified: Secondary | ICD-10-CM | POA: Diagnosis not present

## 2024-02-19 DIAGNOSIS — N183 Chronic kidney disease, stage 3 unspecified: Secondary | ICD-10-CM | POA: Diagnosis not present

## 2024-02-19 DIAGNOSIS — J4521 Mild intermittent asthma with (acute) exacerbation: Secondary | ICD-10-CM

## 2024-02-19 DIAGNOSIS — I10 Essential (primary) hypertension: Secondary | ICD-10-CM | POA: Diagnosis not present

## 2024-02-19 DIAGNOSIS — D649 Anemia, unspecified: Secondary | ICD-10-CM | POA: Diagnosis not present

## 2024-02-19 LAB — CBC
HCT: 33.6 % — ABNORMAL LOW (ref 36.0–46.0)
Hemoglobin: 10.8 g/dL — ABNORMAL LOW (ref 12.0–15.0)
MCH: 27 pg (ref 26.0–34.0)
MCHC: 32.1 g/dL (ref 30.0–36.0)
MCV: 84 fL (ref 80.0–100.0)
Platelets: 230 K/uL (ref 150–400)
RBC: 4 MIL/uL (ref 3.87–5.11)
RDW: 15.1 % (ref 11.5–15.5)
WBC: 6.5 K/uL (ref 4.0–10.5)
nRBC: 0 % (ref 0.0–0.2)

## 2024-02-19 LAB — BLOOD CULTURE ID PANEL (REFLEXED) - BCID2

## 2024-02-19 LAB — COMPREHENSIVE METABOLIC PANEL WITH GFR
ALT: 27 U/L (ref 0–44)
AST: 30 U/L (ref 15–41)
Albumin: 2.7 g/dL — ABNORMAL LOW (ref 3.5–5.0)
Alkaline Phosphatase: 110 U/L (ref 38–126)
Anion gap: 8 (ref 5–15)
BUN: 32 mg/dL — ABNORMAL HIGH (ref 8–23)
CO2: 20 mmol/L — ABNORMAL LOW (ref 22–32)
Calcium: 8.6 mg/dL — ABNORMAL LOW (ref 8.9–10.3)
Chloride: 108 mmol/L (ref 98–111)
Creatinine, Ser: 2.53 mg/dL — ABNORMAL HIGH (ref 0.44–1.00)
GFR, Estimated: 20 mL/min — ABNORMAL LOW (ref >60)
Glucose, Bld: 97 mg/dL (ref 70–99)
Potassium: 3.2 mmol/L — ABNORMAL LOW (ref 3.5–5.1)
Sodium: 136 mmol/L (ref 135–145)
Total Bilirubin: 0.6 mg/dL (ref 0.0–1.2)
Total Protein: 6 g/dL — ABNORMAL LOW (ref 6.5–8.1)

## 2024-02-19 LAB — GLUCOSE, CAPILLARY
Glucose-Capillary: 101 mg/dL — ABNORMAL HIGH (ref 70–99)
Glucose-Capillary: 88 mg/dL (ref 70–99)
Glucose-Capillary: 97 mg/dL (ref 70–99)

## 2024-02-19 LAB — HIV ANTIBODY (ROUTINE TESTING W REFLEX): HIV Screen 4th Generation wRfx: NONREACTIVE

## 2024-02-19 LAB — CBG MONITORING, ED: Glucose-Capillary: 97 mg/dL (ref 70–99)

## 2024-02-19 LAB — CK: Total CK: 78 U/L (ref 38–234)

## 2024-02-19 LAB — PROCALCITONIN: Procalcitonin: 0.21 ng/mL

## 2024-02-19 MED ORDER — POTASSIUM CHLORIDE CRYS ER 20 MEQ PO TBCR
40.0000 meq | EXTENDED_RELEASE_TABLET | Freq: Once | ORAL | Status: AC
  Administered 2024-02-19: 40 meq via ORAL
  Filled 2024-02-19: qty 2

## 2024-02-19 MED ORDER — FAMOTIDINE 20 MG PO TABS
10.0000 mg | ORAL_TABLET | Freq: Every day | ORAL | Status: DC
  Administered 2024-02-20 – 2024-02-21 (×2): 10 mg via ORAL
  Filled 2024-02-19 (×2): qty 1

## 2024-02-19 MED ORDER — ACETAMINOPHEN 325 MG PO TABS
650.0000 mg | ORAL_TABLET | Freq: Two times a day (BID) | ORAL | Status: DC | PRN
  Administered 2024-02-19: 650 mg via ORAL
  Filled 2024-02-19: qty 2

## 2024-02-19 MED ORDER — LACTATED RINGERS IV SOLN: INTRAVENOUS | Status: AC

## 2024-02-19 NOTE — ED Notes (Signed)
 Walked into room. Patient is awake watching TV. States she had not slept much. Patient denies pain and states she was ready to go home. Patient is pleasant and kind. No other needs at this time.

## 2024-02-19 NOTE — Progress Notes (Signed)
 PHARMACY - PHYSICIAN COMMUNICATION CRITICAL VALUE ALERT - BLOOD CULTURE IDENTIFICATION (BCID)  Brooke [name] is an 73 y.o. female who presented to [facility] on 02/18/2024 with a chief complaint of increasing weakness/dizziness  Assessment:  1/4 Bcx bottles growing GPC clusters, BCID showing staph epi with no resistance  Name of physician (or Provider) Contacted: Lavanda Horns  Current antibiotics: Rocephin   Changes to prescribed antibiotics recommended:  Likely a contaminant, no changes recommended at this time.   Results for orders placed or performed during the hospital encounter of 02/18/24  Blood Culture ID Panel (Reflexed) (Collected: 02/18/2024  6:11 PM)  Result Value Ref Range   Enterococcus faecalis NOT DETECTED NOT DETECTED   Enterococcus Faecium NOT DETECTED NOT DETECTED   Listeria monocytogenes NOT DETECTED NOT DETECTED   Staphylococcus species DETECTED (A) NOT DETECTED   Staphylococcus aureus (BCID) NOT DETECTED NOT DETECTED   Staphylococcus epidermidis DETECTED (A) NOT DETECTED   Staphylococcus lugdunensis NOT DETECTED NOT DETECTED   Streptococcus species NOT DETECTED NOT DETECTED   Streptococcus agalactiae NOT DETECTED NOT DETECTED   Streptococcus pneumoniae NOT DETECTED NOT DETECTED   Streptococcus pyogenes NOT DETECTED NOT DETECTED   A.calcoaceticus-baumannii NOT DETECTED NOT DETECTED   Bacteroides fragilis NOT DETECTED NOT DETECTED   Enterobacterales NOT DETECTED NOT DETECTED   Enterobacter cloacae complex NOT DETECTED NOT DETECTED   Escherichia coli NOT DETECTED NOT DETECTED   Klebsiella aerogenes NOT DETECTED NOT DETECTED   Klebsiella oxytoca NOT DETECTED NOT DETECTED   Klebsiella pneumoniae NOT DETECTED NOT DETECTED   Proteus species NOT DETECTED NOT DETECTED   Salmonella species NOT DETECTED NOT DETECTED   Serratia marcescens NOT DETECTED NOT DETECTED   Haemophilus influenzae NOT DETECTED NOT DETECTED   Neisseria meningitidis NOT DETECTED NOT DETECTED    Pseudomonas aeruginosa NOT DETECTED NOT DETECTED   Stenotrophomonas maltophilia NOT DETECTED NOT DETECTED   Candida albicans NOT DETECTED NOT DETECTED   Candida auris NOT DETECTED NOT DETECTED   Candida glabrata NOT DETECTED NOT DETECTED   Candida krusei NOT DETECTED NOT DETECTED   Candida parapsilosis NOT DETECTED NOT DETECTED   Candida tropicalis NOT DETECTED NOT DETECTED   Cryptococcus neoformans/gattii NOT DETECTED NOT DETECTED   Methicillin resistance mecA/C NOT DETECTED NOT DETECTED     Lacinda Moats, PharmD Clinical Pharmacist  7/30/20258:37 PM

## 2024-02-19 NOTE — Progress Notes (Addendum)
 [facility]  PROGRESS NOTE  Brooke [name] FMW:[id number] DOB: 24-Sep-1950 DOA: 02/18/2024 PCP: Norleen Lynwood ORN, MD   Brief HPI:   73 y.o. female with history of COPD, ongoing tobacco abuse, and diabetes mellitus type 2, hypertension, chronic kidney disease stage III, gout, hyperlipidemia, GERD was brought to the ER after patient was complaining increasing weakness and dizziness.  As per the son with whom I spoke and patient lives with patient has been feeling dizzy and weakness for the last few weeks she almost passed out once when she was trying to ambulate.  Did not complain of any chest pain or shortness of breath.  Patient states she has been having poor appetite for last few weeks with some nausea one episode of vomiting denies any diarrhea.  Denies any abdominal pain fever or chills.  No recent change in medications.  Patient has been on her Mounjaro  for more than a year now.     Assessment/Plan:   Acute on chronic kidney disease stage III with hyponatremia and hypokalemia-    suspect likely prerenal with poor oral intake.  In addition patient being on ARB diuretics and also was mildly hypotensive.  CT renal studies pending to rule out any obstruction.  Will continue with hydration.  UA shows mild proteinuria.  Will hold patient's ARB and hydrochlorothiazide  for now.  Follow metabolic panel.  . Dizziness and weakness could be from dehydration and deconditioning.    Check orthostatics vital signs.  PT evaluation, TSH 1.044.  Nausea/anorexia-CT abdomen/pelvis done today shows 8 mm rounded mildly hyperdense area in the tail of pancreas, recommend MRI in 6 months.  Mildly prominent right periductal lymph nodes measuring up to 9 mm could be reactive or metastatic.  Will order CA 19-9, CEA.  Anemia appears to be chronic likely from renal disease.  Anemia panel showed serum iron 39, saturation 21%, ferritin 21, B12 327, folate 10.8  Diabetes mellitus type 2 last hemoglobin A1c was 6.1 about 4  months ago.  Takes Mounjaro  and Jardiance .  Presently on sliding scale coverage.  Hypertension since patient was hypotensive at presentation but improved with fluids will hold ARB and hydrochlorothiazide  for now and continue Cardizem  and metoprolol .   patient takes metoprolol  twice a day but I have placed patient for now on once a day dose.  History of gout on allopurinol .  COPD not actively wheezing continue trilogy.  Hyperlipidemia on statins, CK level 78  Cognitive issues per patient's son.  Patient's son noticed that patient has a and some difficulty with her memory recently.  Will check B12 327 TSH 1.044 RPR ammonia and CT head is unremarkable.  Abnormal UA with no significant signs of any urinary symptoms at this time.  Check cultures.  CT abdomen shows bladder wall thickening.  Continue empiric Rocephin   ?  Pneumonia-CT abdomen shows mild patchy groundglass opacities in the lung bases, consistent with inflammation versus infection.  Patient is currently on Rocephin .  Will check procalcitonin    Medications     allopurinol   100 mg Oral Daily   brimonidine   1 drop Left Eye TID   budesonide -glycopyrrolate -formoterol   2 puff Inhalation BID   citalopram   40 mg Oral Daily   diltiazem   240 mg Oral Daily   [START ON 02/20/2024] famotidine   10 mg Oral Daily   heparin   5,000 Units Subcutaneous Q8H   insulin  aspart  0-9 Units Subcutaneous TID WC   latanoprost   1 drop Left Eye QHS   metoprolol  succinate  100  mg Oral Daily   pantoprazole   40 mg Oral Daily   rosuvastatin   40 mg Oral Daily     Data Reviewed:   CBG:  Recent Labs  Lab 02/19/24 0926 02/19/24 1124 02/19/24 1619  GLUCAP 97 101* 88    SpO2: 94 %    Vitals:   02/19/24 0937 02/19/24 1030 02/19/24 1102 02/19/24 1621  BP: (!) 143/79 129/79 (!) 140/69 135/65  Pulse: 76 75 75 71  Resp: 16 15 16    Temp: 97.6 F (36.4 C)  98.2 F (36.8 C) 98.3 F (36.8 C)  TempSrc:   Oral   SpO2: 93% 92% 100% 94%  Weight:       Height:          Data Reviewed:  Basic Metabolic Panel: Recent Labs  Lab 02/18/24 1811 02/18/24 1826 02/18/24 2139 02/19/24 0551  NA 134* 137  --  136  K 2.7* 2.8*  --  3.2*  CL 101 103  --  108  CO2 24  --   --  20*  GLUCOSE 91 81  --  97  BUN 37* 36*  --  32*  CREATININE 2.84* 2.90* 2.69* 2.53*  CALCIUM  8.5*  --   --  8.6*  MG 2.1  --   --   --     CBC: Recent Labs  Lab 02/18/24 1811 02/18/24 1826 02/18/24 2139 02/19/24 0551  WBC 8.7  --  9.6 6.5  NEUTROABS 5.6  --   --   --   HGB 11.2* 10.9* 10.2* 10.8*  HCT 34.7* 32.0* 32.6* 33.6*  MCV 84.0  --  83.8 84.0  PLT 271  --  250 230    LFT Recent Labs  Lab 02/18/24 1811 02/19/24 0551  AST 30 30  ALT 26 27  ALKPHOS 122 110  BILITOT 0.8 0.6  PROT 6.8 6.0*  ALBUMIN 3.0* 2.7*     Antibiotics: Anti-infectives (From admission, onward)    Start     Dose/Rate Route Frequency Ordered Stop   02/19/24 2200  cefTRIAXone  (ROCEPHIN ) 1 g in sodium chloride  0.9 % 100 mL IVPB        1 g 200 mL/hr over 30 Minutes Intravenous Every 24 hours 02/18/24 2339     02/18/24 2145  cefTRIAXone  (ROCEPHIN ) 1 g in sodium chloride  0.9 % 100 mL IVPB        1 g 200 mL/hr over 30 Minutes Intravenous  Once 02/18/24 2136 02/18/24 2339        DVT prophylaxis: Heparin   Code Status: Full code  Family Communication: No family at bedside   CONSULTS    Subjective   Feels better this morning.   Objective    Physical Examination:   General-appears in no acute distress Heart-S1-S2, regular, no murmur auscultated Lungs-clear to auscultation bilaterally, no wheezing or crackles auscultated Abdomen-soft, nontender, no organomegaly Extremities-no edema in the lower extremities Neuro-alert, oriented x3, no focal deficit noted  Status is: Inpatient:             [name]   [facility] If 7PM-7AM, please contact night-coverage at [email or web address], Office  [phone]   02/19/2024, 5:01 PM  LOS: 0  days

## 2024-02-19 NOTE — Evaluation (Signed)
 Occupational Therapy Evaluation Patient Details Name: Brooke [name] MRN: [id number] DOB: [birth year] Today's Date: 02/19/2024   History of Present Illness   73 yr old female  comes to ED 02/18/24 with progressive weakness, hyponatremia, hypokalemia, acute renal failure. PMH:S/P left TKA , R TKA , depression, vertigo, Vtach, syncope,DMII, HTN, Renal artery stenosis.     Clinical Impressions The pt is currently presenting slightly below her baseline level of functioning for self-care management. She is presenting with the below listed deficits (see OT problem list). During the session, she required CGA to stand using a RW, as well as for toileting at bathroom level and grooming in standing at the sink. She reported a history of vertigo, resulting in occasional dizziness and feeling shaky. She will benefit from further OT services to maximize her safety and independence with self-care tasks. She desires to return home at discharge with family support as needed. If her family is able to provide support as needed, OT anticipates she will be okay to return home with home health therapy services. OT will continue to follow her in the acute care setting.      If plan is discharge home, recommend the following:   Assistance with cooking/housework;Help with stairs or ramp for entrance;Assist for transportation     Functional Status Assessment   Patient has had a recent decline in their functional status and demonstrates the ability to make significant improvements in function in a reasonable and predictable amount of time.     Equipment Recommendations   Tub/shower bench     Recommendations for Other Services         Precautions/Restrictions   Precautions Precautions: Fall Restrictions Weight Bearing Restrictions Per Provider Order: No     Mobility Bed Mobility Overal bed mobility: Needs Assistance Bed Mobility: Supine to Sit     Supine to sit: Supervision Sit to supine:  Supervision        Transfers Overall transfer level: Needs assistance Equipment used: Rolling walker (2 wheels) Transfers: Sit to/from Stand Sit to Stand: Contact guard assist                  Balance     Sitting balance-Leahy Scale: Good         Standing balance comment: CGA with RW         ADL either performed or assessed with clinical judgement   ADL Overall ADL's : Needs assistance/impaired Eating/Feeding: Independent;Sitting   Grooming: Contact guard assist;Standing Grooming Details (indicate cue type and reason): She performed hand washing in standing at the sink.         Upper Body Dressing : Set up;Sitting   Lower Body Dressing: Contact guard assist;Sitting/lateral leans   Toilet Transfer: Contact guard assist;Rolling walker (2 wheels);Ambulation;Grab bars   Toileting- Clothing Manipulation and Hygiene: Contact guard assist;Sit to/from stand Toileting - Clothing Manipulation Details (indicate cue type and reason): She performed toileting tasks at bathroom level.             Vision Baseline Vision/History: 1 Wears glasses Additional Comments: She correctly read the time depicted on the wall clock.            Pertinent Vitals/Pain Pain Assessment Pain Assessment: No/denies pain     Extremity/Trunk Assessment Upper Extremity Assessment Upper Extremity Assessment: RUE deficits/detail;LUE deficits/detail;Right hand dominant RUE Deficits / Details: AROM WFL. Grip strength 4/5 LUE Deficits / Details: AROM WFL. Grip strength 4/5   Lower Extremity Assessment Lower Extremity Assessment: RLE deficits/detail;LLE deficits/detail RLE  Deficits / Details: AROM WFL LLE Deficits / Details: AROM WFL      Communication Communication Communication: No apparent difficulties   Cognition Arousal: Alert Behavior During Therapy: WFL for tasks assessed/performed        OT - Cognition Comments: Oriented x4                 Following  commands: Intact                  Home Living Family/patient expects to be discharged to:: Private residence Living Arrangements: Children (Son, daughter-in-law, and 3 adolescent grandkids) Available Help at Discharge: Family Type of Home: House Home Access: Stairs to enter     Home Layout: One level     Bathroom Shower/Tub: Tub/shower unit         Home Equipment: Rollator (4 wheels);Cane - single point   Additional Comments: lives with son      Prior Functioning/Environment Prior Level of Function : Independent/Modified Independent             Mobility Comments:  (She has been using a rollator for household ambulation over the past ~2 months.) ADLs Comments:  (She was modified independent to independent with ADLs, she does not drive and her daughter-in-law performed the cooking and cleaning.)    OT Problem List: Decreased strength;Impaired balance (sitting and/or standing);Decreased knowledge of use of DME or AE   OT Treatment/Interventions: Self-care/ADL training;Therapeutic exercise;Therapeutic activities;Energy conservation;DME and/or AE instruction;Patient/family education;Balance training      OT Goals(Current goals can be found in the care plan section)   Acute Rehab OT Goals Patient Stated Goal: to discharge home tomorrow OT Goal Formulation: With patient Time For Goal Achievement: 03/04/24 Potential to Achieve Goals: Good ADL Goals Pt Will Perform Grooming: with modified independence;standing Pt Will Perform Lower Body Dressing: with modified independence;sitting/lateral leans;sit to/from stand Pt Will Transfer to Toilet: with modified independence;ambulating Pt Will Perform Toileting - Clothing Manipulation and hygiene: with modified independence;sit to/from stand   OT Frequency:  Min 2X/week       AM-PAC OT 6 Clicks Daily Activity     Outcome Measure Help from another person eating meals?: None Help from another person taking care of  personal grooming?: A Little Help from another person toileting, which includes using toliet, bedpan, or urinal?: A Little Help from another person bathing (including washing, rinsing, drying)?: A Little Help from another person to put on and taking off regular upper body clothing?: None Help from another person to put on and taking off regular lower body clothing?: A Little 6 Click Score: 20   End of Session Equipment Utilized During Treatment: Rolling walker (2 wheels);Gait belt Nurse Communication: Mobility status  Activity Tolerance: Other (comment) (Fair+ tolerance) Patient left: in bed;with call bell/phone within reach;with bed alarm set  OT Visit Diagnosis: Unsteadiness on feet (R26.81);History of falling (Z91.81);Dizziness and giddiness (R42);Muscle weakness (generalized) (M62.81)                Time: 8356-8340 OT Time Calculation (min): 16 min Charges:  OT General Charges $OT Visit: 1 Visit OT Evaluation $OT Eval Moderate Complexity: 1 Mod    Tirso Laws L Hideo Googe, OTR/L 02/19/2024, 5:29 PM

## 2024-02-19 NOTE — Evaluation (Addendum)
 Physical Therapy Evaluation Patient Details Name: Brooke [name] MRN: [id number] DOB: 26-Dec-1950 Today's Date: 02/19/2024  History of Present Illness  73 yo female  comes to Ed 02/18/24 with progressive weakness, hyponatremia, hypokalemia, Acute renal failure. PMH:S/P left TKA , R TKA , depression, vertigo, Vtach, syncope,DMII, HTN, Renal artery stenosis.  Clinical Impression    Pt admitted with above diagnosis.  Pt currently with functional limitations due to the deficits listed below (see PT Problem List). Pt will benefit from acute skilled PT to increase their independence and safety with mobility to allow discharge.     The patient  does present  with unsteady gait requiring a Rw for stability. Patient ambulated x 45' using a Rw. Patient reports feeling slightly dizzy and shakey.  Patient reports being independent at baseline, resides with her son. Patient will benefit from continued inpatient follow up therapy, <3 hours/day  But  patient may progress to return home with family support once medical issues are  improved.        If plan is discharge home, recommend the following: A little help with walking and/or transfers;Assistance with cooking/housework;Help with stairs or ramp for entrance;A little help with bathing/dressing/bathroom   Can travel by private vehicle        Equipment Recommendations  none  Recommendations for Other Services    OT   Functional Status Assessment Patient has had a recent decline in their functional status and demonstrates the ability to make significant improvements in function in a reasonable and predictable amount of time.     Precautions / Restrictions Precautions Precautions: Fall Restrictions Weight Bearing Restrictions Per Provider Order: No      Mobility  Bed Mobility Overal bed mobility: Needs Assistance Bed Mobility: Supine to Sit, Sit to Supine     Supine to sit: Supervision Sit to supine: Supervision         Transfers Overall transfer level: Needs assistance Equipment used: Rolling walker (2 wheels) Transfers: Sit to/from Stand Sit to Stand: Contact guard assist           General transfer comment: cues for safety  Stand pivot  transfer to [facility] with min assistance  Ambulation/Gait Ambulation/Gait assistance: Min assist Gait Distance (Feet): 45 Feet Assistive device: Rolling walker (2 wheels) Gait Pattern/deviations: Step-through pattern, Staggering left, Decreased stride length Gait velocity: decr     General Gait Details: intermittent left knee buckling, slight imbalnce, steady assistance required  Stairs            Wheelchair Mobility     Tilt Bed    Modified Rankin (Stroke Patients Only)       Balance Overall balance assessment: Needs assistance Sitting-balance support: Bilateral upper extremity supported, Feet supported Sitting balance-Leahy Scale: Good     Standing balance support: Bilateral upper extremity supported, During functional activity, Reliant on assistive device for balance Standing balance-Leahy Scale: Poor Standing balance comment: reliant on RW                             Pertinent Vitals/Pain Pain Assessment Pain Assessment: No/denies pain    Home Living Family/patient expects to be discharged to:: Private residence Living Arrangements: Children Available Help at Discharge: Family;Available PRN/intermittently Type of Home: House Home Access: Stairs to enter       Home Layout: One level Home Equipment: Agricultural consultant (2 wheels);BSC/3in1 Additional Comments: lives with son    Prior Function Prior Level of Function : Independent/Modified Independent  Mobility Comments: has not needed Rw until recent weakness ADLs Comments: independent     Extremity/Trunk Assessment   Upper Extremity Assessment Upper Extremity Assessment: Generalized weakness    Lower Extremity Assessment Lower Extremity  Assessment: Generalized weakness    Cervical / Trunk Assessment Cervical / Trunk Assessment: Normal  Communication   Communication Communication: No apparent difficulties    Cognition Arousal: Alert Behavior During Therapy: WFL for tasks assessed/performed   PT - Cognitive impairments: No apparent impairments                         Following commands: Intact       Cueing       General Comments      Exercises     Assessment/Plan    PT Assessment Patient needs continued PT services  PT Problem List Decreased strength;Decreased activity tolerance;Decreased mobility;Decreased knowledge of precautions       PT Treatment Interventions DME instruction;Therapeutic exercise;Gait training;Functional mobility training;Therapeutic activities;Patient/family education    PT Goals (Current goals can be found in the Care Plan section)  Acute Rehab PT Goals Patient Stated Goal: to go home PT Goal Formulation: With patient Time For Goal Achievement: 03/04/24 Potential to Achieve Goals: Good    Frequency Min 2X/week     Co-evaluation               AM-PAC PT 6 Clicks Mobility  Outcome Measure Help needed turning from your back to your side while in a flat bed without using bedrails?: None Help needed moving from lying on your back to sitting on the side of a flat bed without using bedrails?: None Help needed moving to and from a bed to a chair (including a wheelchair)?: A Little Help needed standing up from a chair using your arms (e.g., wheelchair or bedside chair)?: A Little Help needed to walk in hospital room?: A Little Help needed climbing 3-5 steps with a railing? : A Lot 6 Click Score: 19    End of Session Equipment Utilized During Treatment: Gait belt Activity Tolerance: Patient tolerated treatment well Patient left: in bed;with call bell/phone within reach Nurse Communication: Mobility status PT Visit Diagnosis: Unsteadiness on feet  (R26.81);Muscle weakness (generalized) (M62.81);Difficulty in walking, not elsewhere classified (R26.2)    Time: 9154-9095 PT Time Calculation (min) (ACUTE ONLY): 19 min   Charges:   PT Evaluation $PT Eval Low Complexity: 1 Low   PT General Charges $$ ACUTE PT VISIT: 1 Visit    Darice Potters PT Acute Rehabilitation Services Office [phone]      02/19/2024, 1:54 PM

## 2024-02-19 NOTE — Plan of Care (Signed)

## 2024-02-20 DIAGNOSIS — I129 Hypertensive chronic kidney disease with stage 1 through stage 4 chronic kidney disease, or unspecified chronic kidney disease: Secondary | ICD-10-CM | POA: Diagnosis present

## 2024-02-20 DIAGNOSIS — E876 Hypokalemia: Secondary | ICD-10-CM | POA: Diagnosis present

## 2024-02-20 DIAGNOSIS — E871 Hypo-osmolality and hyponatremia: Secondary | ICD-10-CM | POA: Diagnosis present

## 2024-02-20 DIAGNOSIS — Z8249 Family history of ischemic heart disease and other diseases of the circulatory system: Secondary | ICD-10-CM | POA: Diagnosis not present

## 2024-02-20 DIAGNOSIS — N179 Acute kidney failure, unspecified: Secondary | ICD-10-CM | POA: Diagnosis not present

## 2024-02-20 DIAGNOSIS — J440 Chronic obstructive pulmonary disease with acute lower respiratory infection: Secondary | ICD-10-CM | POA: Diagnosis present

## 2024-02-20 DIAGNOSIS — J4521 Mild intermittent asthma with (acute) exacerbation: Secondary | ICD-10-CM | POA: Diagnosis not present

## 2024-02-20 DIAGNOSIS — Z1152 Encounter for screening for COVID-19: Secondary | ICD-10-CM | POA: Diagnosis not present

## 2024-02-20 DIAGNOSIS — B961 Klebsiella pneumoniae [K. pneumoniae] as the cause of diseases classified elsewhere: Secondary | ICD-10-CM | POA: Diagnosis present

## 2024-02-20 DIAGNOSIS — R531 Weakness: Secondary | ICD-10-CM | POA: Diagnosis present

## 2024-02-20 DIAGNOSIS — K862 Cyst of pancreas: Secondary | ICD-10-CM | POA: Diagnosis present

## 2024-02-20 DIAGNOSIS — M109 Gout, unspecified: Secondary | ICD-10-CM | POA: Diagnosis not present

## 2024-02-20 DIAGNOSIS — Z96653 Presence of artificial knee joint, bilateral: Secondary | ICD-10-CM | POA: Diagnosis present

## 2024-02-20 DIAGNOSIS — E1122 Type 2 diabetes mellitus with diabetic chronic kidney disease: Secondary | ICD-10-CM | POA: Diagnosis present

## 2024-02-20 DIAGNOSIS — D631 Anemia in chronic kidney disease: Secondary | ICD-10-CM | POA: Diagnosis present

## 2024-02-20 DIAGNOSIS — Z79899 Other long term (current) drug therapy: Secondary | ICD-10-CM | POA: Diagnosis not present

## 2024-02-20 DIAGNOSIS — R935 Abnormal findings on diagnostic imaging of other abdominal regions, including retroperitoneum: Secondary | ICD-10-CM

## 2024-02-20 DIAGNOSIS — N1832 Chronic kidney disease, stage 3b: Secondary | ICD-10-CM | POA: Diagnosis present

## 2024-02-20 DIAGNOSIS — E785 Hyperlipidemia, unspecified: Secondary | ICD-10-CM | POA: Diagnosis present

## 2024-02-20 DIAGNOSIS — J189 Pneumonia, unspecified organism: Secondary | ICD-10-CM | POA: Diagnosis present

## 2024-02-20 DIAGNOSIS — E860 Dehydration: Secondary | ICD-10-CM | POA: Diagnosis present

## 2024-02-20 DIAGNOSIS — N3000 Acute cystitis without hematuria: Secondary | ICD-10-CM

## 2024-02-20 DIAGNOSIS — K8689 Other specified diseases of pancreas: Secondary | ICD-10-CM | POA: Diagnosis not present

## 2024-02-20 DIAGNOSIS — R599 Enlarged lymph nodes, unspecified: Secondary | ICD-10-CM

## 2024-02-20 DIAGNOSIS — Z87891 Personal history of nicotine dependence: Secondary | ICD-10-CM | POA: Diagnosis not present

## 2024-02-20 DIAGNOSIS — E039 Hypothyroidism, unspecified: Secondary | ICD-10-CM | POA: Diagnosis present

## 2024-02-20 DIAGNOSIS — Z7984 Long term (current) use of oral hypoglycemic drugs: Secondary | ICD-10-CM | POA: Diagnosis not present

## 2024-02-20 DIAGNOSIS — I700 Atherosclerosis of aorta: Secondary | ICD-10-CM | POA: Diagnosis present

## 2024-02-20 DIAGNOSIS — Z7985 Long-term (current) use of injectable non-insulin antidiabetic drugs: Secondary | ICD-10-CM | POA: Diagnosis not present

## 2024-02-20 DIAGNOSIS — N183 Chronic kidney disease, stage 3 unspecified: Secondary | ICD-10-CM | POA: Diagnosis not present

## 2024-02-20 LAB — COMPREHENSIVE METABOLIC PANEL WITH GFR
ALT: 25 U/L (ref 0–44)
AST: 27 U/L (ref 15–41)
Albumin: 2.5 g/dL — ABNORMAL LOW (ref 3.5–5.0)
Alkaline Phosphatase: 103 U/L (ref 38–126)
Anion gap: 9 (ref 5–15)
BUN: 22 mg/dL (ref 8–23)
CO2: 20 mmol/L — ABNORMAL LOW (ref 22–32)
Calcium: 8.6 mg/dL — ABNORMAL LOW (ref 8.9–10.3)
Chloride: 109 mmol/L (ref 98–111)
Creatinine, Ser: 1.83 mg/dL — ABNORMAL HIGH (ref 0.44–1.00)
GFR, Estimated: 29 mL/min — ABNORMAL LOW (ref >60)
Glucose, Bld: 83 mg/dL (ref 70–99)
Potassium: 3.5 mmol/L (ref 3.5–5.1)
Sodium: 138 mmol/L (ref 135–145)
Total Bilirubin: 0.4 mg/dL (ref 0.0–1.2)
Total Protein: 5.7 g/dL — ABNORMAL LOW (ref 6.5–8.1)

## 2024-02-20 LAB — CEA: CEA: 3.2 ng/mL (ref 0.0–4.7)

## 2024-02-20 LAB — RPR
RPR Ser Ql: NONREACTIVE
RPR Ser Ql: NONREACTIVE

## 2024-02-20 LAB — CBC
HCT: 31.9 % — ABNORMAL LOW (ref 36.0–46.0)
Hemoglobin: 10 g/dL — ABNORMAL LOW (ref 12.0–15.0)
MCH: 27 pg (ref 26.0–34.0)
MCHC: 31.3 g/dL (ref 30.0–36.0)
MCV: 86 fL (ref 80.0–100.0)
Platelets: 200 K/uL (ref 150–400)
RBC: 3.71 MIL/uL — ABNORMAL LOW (ref 3.87–5.11)
RDW: 15.3 % (ref 11.5–15.5)
WBC: 5.2 K/uL (ref 4.0–10.5)
nRBC: 0 % (ref 0.0–0.2)

## 2024-02-20 LAB — GLUCOSE, CAPILLARY
Glucose-Capillary: 103 mg/dL — ABNORMAL HIGH (ref 70–99)
Glucose-Capillary: 113 mg/dL — ABNORMAL HIGH (ref 70–99)
Glucose-Capillary: 110 mg/dL — ABNORMAL HIGH (ref 70–99)
Glucose-Capillary: 99 mg/dL (ref 70–99)

## 2024-02-20 LAB — CANCER ANTIGEN 19-9: CA 19-9: 28 U/mL (ref 0–35)

## 2024-02-20 NOTE — Consult Note (Signed)
 Consultation  Referring Provider:   Dr. Drusilla Primary Care Physician:  Norleen Lynwood ORN, MD Primary Gastroenterologist: Dr. Avram        Reason for Consultation:   Abnormal CT of the abdomen         HPI:   Brooke [name] is a 73 y.o. female with a past medical history as listed below including COPD, diabetes type 2, hypertension, CKD stage III, GERD and multiple others, who initially presented to the ED on 02/18/2024 for weakness and dizziness.  We are consulted now in regards to an abnormal CT of the abdomen with pancreatic lesion as well as rectal lymph nodes.    At time of admission patient came in with her son and described feeling dizzy and weak for the past few weeks and almost passed out once when she was trying to walk.  Also with a poor appetite for the past few weeks and some nausea with an episode of vomiting.  No diarrhea.  Currently on Mounjaro  for over the past year.    Today, patient tells me that about 2 months ago she started with a decreased appetite, she would eat a couple bites of food and then just would not want it anymore.  She was then started on Mounjaro  a month ago and has continued with this feeling of decreased appetite and has noticed some weight loss.  Otherwise no change in bowel habits, no rectal bleeding or pain.  In general she feels fairly well, feeling stronger this hospitalization.    Denies fever, chills, nausea, vomiting or symptoms that awaken her from sleep.  ER course: Hypokalemia, hyponatremia with acute renal failure with creatinine of 2.9 worsened from 1.5 in March 2025  Hospital course: CT renal stone study yesterday for acute renal failure with mild thickening and inflammation of the bladder dome compatible with cystitis, multiple prominent right perirectal lymph nodes measuring up to 9 mm which may be reactive or metastatic, mild patchy groundglass opacities in the lower lobes of the right greater than left worrisome for infection or inflammation, 8  mm rounded mildly hyperdense area in the tail of the pancreas-recommended follow-up MRI in 6 months and colonic diverticulosis; CMP today with creatinine improvement from 2.53--> 1.83, BUN normal, albumin 2.5, LFTs normal, CBC with hemoglobin of 10 (10.8 yesterday), CEA yesterday is normal at 3.2, CA 19-9 normal at 28  GI history: 01/19/2020 colonoscopy with Dr. Avram with diverticulosis in the sigmoid, descending, transverse and ascending colon otherwise normal-no repeat recommended due to age 69/18/2023 MRI of the abdomen with and without contrast done for renal cysts with no abnormality seen in the pancreas or colon  Past Medical History:  Diagnosis Date   Alopecia    Anxiety    Aortic atherosclerosis (HCC)    Asthma    FOLLOWED BY PCP   Eczema    GERD (gastroesophageal reflux disease)    Gout    04-28-2018--- per pt stable , as been a while since last episode   Heart murmur    History of colon polyps    History of syncope 2015   Hyperlipidemia    Hypertension    Hypothyroidism    OA (osteoarthritis) of knee    bilateral   PVC's (premature ventricular contractions)    Toxic multinodular goiter    03/ 2003  s/p RAI   Type 2 diabetes mellitus (HCC)    followed by pcp   Ventricular tachycardia, polymorphic (HCC) 01/04/2014   primary cardiologist--  dr wilbert turner (hx monitor [phone] showed couplet PVCs, as trigger)   Wears glasses    Wears partial dentures    upper    Past Surgical History:  Procedure Laterality Date   ABDOMINAL HYSTERECTOMY  10/22/1999   WITH BSO   CATARACT EXTRACTION W/ INTRAOCULAR LENS IMPLANT Left YRS AGO   COLONOSCOPY     EXCISION ABDOMINAL WALL MASS  12-20-2005   dr merrilyn @[facility]    neruofibroma   LAPAROSCOPIC CHOLECYSTECTOMY  10/01/2002   dr merrilyn @[facility]    LEFT HEART CATHETERIZATION WITH CORONARY ANGIOGRAM N/A 01/07/2014   Procedure: LEFT HEART CATHETERIZATION WITH CORONARY ANGIOGRAM;  Surgeon: Peter M Swaziland, MD;  Location: [facility] CATH LAB;  Service:  Cardiovascular;  Laterality: N/A;   RASTELLI PROCEDURE  6/98 neg   RENAL ARTERY STENT Left 11/2003   angioplasty and stenting   RENAL ARTERY STENT Left 02/2005   re-stenting   TOTAL KNEE ARTHROPLASTY Right 05/05/2018   Procedure: RIGHT TOTAL KNEE ARTHROPLASTY;  Surgeon: Liam Lerner, MD;  Location: [facility];  Service: Orthopedics;  Laterality: Right;   TOTAL KNEE ARTHROPLASTY Left 09/30/2018   Procedure: TOTAL KNEE ARTHROPLASTY;  Surgeon: Sheril Coy, MD;  Location: [facility];  Service: Orthopedics;  Laterality: Left;    Family History  Problem Relation Age of Onset   Diabetes Mother    Heart disease Father    Colon cancer Neg Hx    Esophageal cancer Neg Hx    Rectal cancer Neg Hx    Stomach cancer Neg Hx    BRCA 1/2 Neg Hx    Breast cancer Neg Hx      Social History   Tobacco Use   Smoking status: Never    Passive exposure: Never   Smokeless tobacco: Never  Vaping Use   Vaping status: Never Used  Substance Use Topics   Alcohol use: No   Drug use: Never    Prior to Admission medications   Medication Sig Start Date End Date Taking? Authorizing Provider  acetaminophen  (TYLENOL ) 325 MG tablet Take 650 mg by mouth every 6 (six) hours as needed for mild pain.   Yes Norleen Lynwood ORN, MD  albuterol  (VENTOLIN  HFA) [phone] Base) MCG/ACT inhaler Inhale 2 puffs into the lungs every 4 (four) hours as needed for wheezing or shortness of breath. 11/27/23  Yes Jeneal Danita Macintosh, MD  allopurinol  (ZYLOPRIM ) 100 MG tablet TAKE 1 TABLET BY MOUTH EVERY DAY 07/08/23  Yes Norleen Lynwood ORN, MD  atropine 1 % ophthalmic solution Place 1 drop into the left eye daily. 01/16/24  Yes [provider]  azelastine  (ASTELIN ) 0.1 % nasal spray Place 2 sprays into both nostrils 2 (two) times daily. Use in each nostril as directed 02/25/23  Yes Norleen Lynwood ORN, MD  brimonidine  (ALPHAGAN ) 0.2 % ophthalmic solution 3 (three) times daily. 1 drop in the left eye three times daily   Yes [provider]   cetirizine  (ZYRTEC ) 10 MG tablet Take 1 tablet (10 mg total) by mouth daily. 01/03/24  Yes Jeneal Danita Macintosh, MD  citalopram  (CELEXA ) 40 MG tablet TAKE 1 TABLET BY MOUTH EVERY DAY 05/01/23  Yes Norleen Lynwood ORN, MD  clonazePAM  (KLONOPIN ) 0.5 MG tablet Take 1 tablet (0.5 mg total) by mouth daily as needed (vertigo). 02/25/23  Yes Norleen Lynwood ORN, MD  colchicine  0.6 MG tablet TAKE 0.5 TABLETS (0.3 MG TOTAL) BY MOUTH DAILY AS NEEDED (GOUT OR PSUEDOGOUT PAIN). 10/01/22  Yes Joane Artist RAMAN, MD  diltiazem  (CARDIZEM  CD) 240 MG  24 hr capsule Take 1 capsule (240 mg total) by mouth daily. 10/02/23  Yes Norleen Lynwood ORN, MD  famotidine  (PEPCID ) 20 MG tablet Take 1 tablet (20 mg total) by mouth 2 (two) times daily. 11/27/23  Yes Padgett, Danita Macintosh, MD  fexofenadine  (ALLEGRA ) 180 MG tablet Take 1 tablet (180 mg total) by mouth daily. 02/25/23  Yes Norleen Lynwood ORN, MD  Fluticasone -Umeclidin-Vilant (TRELEGY ELLIPTA ) 200-62.5-25 MCG/ACT AEPB Inhale 1 puff into the lungs daily. 11/27/23  Yes Padgett, Danita Macintosh, MD  hydrochlorothiazide  (MICROZIDE ) 12.5 MG capsule Take 1 capsule (12.5 mg total) by mouth daily. 10/02/23  Yes Norleen Lynwood ORN, MD  JARDIANCE  25 MG TABS tablet TAKE 1 TABLET BY MOUTH EVERY DAY BEFORE BREAKFAST 11/26/23  Yes Norleen Lynwood ORN, MD  latanoprost  (XALATAN ) 0.005 % ophthalmic solution Place 1 drop into the left eye at bedtime.   Yes [provider]  meclizine  (ANTIVERT ) 25 MG tablet TAKE 1 TABLET BY MOUTH 3 TIMES A DAY AS NEEDED FOR DIZZINESS 11/04/23  Yes Norleen Lynwood ORN, MD  metoprolol  succinate (TOPROL -XL) 100 MG 24 hr tablet TAKE 1 TABLET BY MOUTH 2 (TWO) TIMES DAILY. TAKE WITH OR IMMEDIATELY FOLLOWING A MEAL. 07/19/23  Yes Turner, Wilbert SAUNDERS, MD  metoprolol  succinate (TOPROL -XL) 25 MG 24 hr tablet TAKE 1 TABLET BY MOUTH IN THE MORNING AND AT BEDTIME. TAKE THIS WITH 100 MG TABLET DAILY. 07/08/23  Yes Turner, Wilbert SAUNDERS, MD  omeprazole  (PRILOSEC) 40 MG capsule Take 1 capsule (40 mg total) by mouth daily.  12/05/21  Yes Norleen Lynwood ORN, MD  rosuvastatin  (CRESTOR ) 40 MG tablet TAKE 1 TABLET BY MOUTH EVERY DAY 04/03/23  Yes Turner, Wilbert SAUNDERS, MD  telmisartan  (MICARDIS ) 80 MG tablet Take 80 mg by mouth daily. 09/13/21  Yes [provider]  tirzepatide  (MOUNJARO ) 2.5 MG/0.5ML Pen Inject 2.5 mg into the skin once a week. 01/09/24  Yes Norleen Lynwood ORN, MD  traZODone  (DESYREL ) 100 MG tablet Take 1 tablet (100 mg total) by mouth at bedtime as needed. for sleep Patient taking differently: Take 100 mg by mouth at bedtime. for sleep 09/24/23  Yes Norleen Lynwood ORN, MD  acetaZOLAMIDE (DIAMOX) 250 MG tablet Take by mouth. Patient not taking: Reported on 02/18/2024 08/21/22   [provider]  dorzolamide  (TRUSOPT ) 2 % ophthalmic solution Place 1 drop into the left eye 3 (three) times daily. Patient not taking: Reported on 02/18/2024    [provider]  oxybutynin  (DITROPAN ) 5 MG tablet TAKE 1 TABLET (5 MG TOTAL) BY MOUTH DAILY AT 10 PM. Patient not taking: Reported on 02/18/2024 02/05/22   Norleen Lynwood ORN, MD  potassium chloride  SA (KLOR-CON  M) 20 MEQ tablet Take 1 tablet (20 mEq total) by mouth 2 (two) times daily for 5 days. Patient not taking: Reported on 02/18/2024 04/26/22 09/05/22  Randol Simmonds, MD    Current Facility-Administered Medications  Medication Dose Route Frequency Provider Last Rate Last Admin   acetaminophen  (TYLENOL ) tablet 650 mg  650 mg Oral BID PRN Chavez, Abigail, NP   650 mg at 02/19/24 2135   albuterol  (PROVENTIL ) (2.5 MG/3ML) 0.083% nebulizer solution 2.5 mg  2.5 mg Nebulization Q4H PRN Franky Redia SAILOR, MD       allopurinol  (ZYLOPRIM ) tablet 100 mg  100 mg Oral Daily Franky Redia SAILOR, MD   100 mg at 02/20/24 0947   brimonidine  (ALPHAGAN ) 0.2 % ophthalmic solution 1 drop  1 drop Left Eye TID Franky Redia SAILOR, MD   1 drop at 02/20/24 [phone]  budesonide -glycopyrrolate -formoterol  (BREZTRI ) 160-9-4.8 MCG/ACT inhaler 2 puff  2 puff Inhalation BID Franky Redia SAILOR, MD   2 puff  at 02/20/24 0758   cefTRIAXone  (ROCEPHIN ) 1 g in sodium chloride  0.9 % 100 mL IVPB  1 g Intravenous Q24H Franky Redia SAILOR, MD 200 mL/hr at 02/19/24 2143 1 g at 02/19/24 2143   citalopram  (CELEXA ) tablet 40 mg  40 mg Oral Daily Franky Redia SAILOR, MD   40 mg at 02/20/24 0945   diltiazem  (CARDIZEM  CD) 24 hr capsule 240 mg  240 mg Oral Daily Franky Redia SAILOR, MD   240 mg at 02/20/24 9052   famotidine  (PEPCID ) tablet 10 mg  10 mg Oral Daily Drusilla Sabas RAMAN, MD   10 mg at 02/20/24 0945   heparin  injection 5,000 Units  5,000 Units Subcutaneous Q8H Franky Redia SAILOR, MD   5,000 Units at 02/19/24 2135   insulin  aspart (novoLOG ) injection 0-9 Units  0-9 Units Subcutaneous TID WC Franky Redia SAILOR, MD       lactated ringers  infusion   Intravenous Continuous Drusilla Sabas RAMAN, MD 100 mL/hr at 02/20/24 0540 New Bag at 02/20/24 0540   latanoprost  (XALATAN ) 0.005 % ophthalmic solution 1 drop  1 drop Left Eye QHS Franky Redia SAILOR, MD   1 drop at 02/19/24 2136   metoprolol  succinate (TOPROL -XL) 24 hr tablet 100 mg  100 mg Oral Daily Franky Redia SAILOR, MD   100 mg at 02/20/24 9052   pantoprazole  (PROTONIX ) EC tablet 40 mg  40 mg Oral Daily Franky Redia SAILOR, MD   40 mg at 02/20/24 9052   rosuvastatin  (CRESTOR ) tablet 40 mg  40 mg Oral Daily Franky Redia SAILOR, MD   40 mg at 02/19/24 9075    Allergies as of 02/18/2024 - Review Complete 02/18/2024  Allergen Reaction Noted   Metformin  and related Nausea And Vomiting 12/02/2020   Ace inhibitors Swelling    Codeine Nausea And Vomiting and Rash    Hydrocodone  Nausea And Vomiting 08/18/2012   Tramadol  Nausea And Vomiting 08/18/2012   Tylenol  [acetaminophen ] Itching and Rash 07/30/2018     Review of Systems:    Constitutional: No fever or chills Skin: No rash Cardiovascular: No chest pain  Respiratory: No SOB Gastrointestinal: See HPI and otherwise negative Genitourinary: No dysuria Neurological: No headache, dizziness or  syncope Musculoskeletal: No new muscle or joint pain Hematologic: No bleeding  Psychiatric: No history of depression or anxiety    Physical Exam:  Vital signs in last 24 hours: Temp:  [97.3 F (36.3 C)-98.3 F (36.8 C)] 97.3 F (36.3 C) (07/31 0404) Pulse Rate:  [71-75] 75 (07/31 0404) Resp:  [16] 16 (07/31 0404) BP: (116-149)/(65-96) 116/96 (07/31 0404) SpO2:  [94 %-100 %] 94 % (07/31 0758) Last BM Date : 02/19/24 General:   Pleasant overweight AA female appears to be in NAD, Well developed, Well nourished, alert and cooperative Head:  Normocephalic and atraumatic. Eyes:   PEERL, EOMI. No icterus. Conjunctiva pink. Ears:  Normal auditory acuity. Neck:  Supple Throat: Oral cavity and pharynx without inflammation, swelling or lesion.  Lungs: Respirations even and unlabored. Lungs clear to auscultation bilaterally.   No wheezes, crackles, or rhonchi.  Heart: Normal S1, S2. No MRG. Regular rate and rhythm. No peripheral edema, cyanosis or pallor.  Abdomen:  Soft, nondistended, nontender. No rebound or guarding. Normal bowel sounds. No appreciable masses or hepatomegaly. Rectal:  Not performed.  Msk:  Symmetrical without gross deformities. Peripheral pulses intact.  Extremities:  Without edema, no  deformity or joint abnormality.  Neurologic:  Alert and  oriented x4;  grossly normal neurologically.  Skin:   Dry and intact without significant lesions or rashes. Psychiatric: Demonstrates good judgement and reason without abnormal affect or behaviors.   LAB RESULTS: Recent Labs    02/18/24 2139 02/19/24 0551 02/20/24 0604  WBC 9.6 6.5 5.2  HGB 10.2* 10.8* 10.0*  HCT 32.6* 33.6* 31.9*  PLT 250 230 200   BMET Recent Labs    02/18/24 1811 02/18/24 1826 02/18/24 2139 02/19/24 0551 02/20/24 0604  NA 134* 137  --  136 138  K 2.7* 2.8*  --  3.2* 3.5  CL 101 103  --  108 109  CO2 24  --   --  20* 20*  GLUCOSE 91 81  --  97 83  BUN 37* 36*  --  32* 22  CREATININE 2.84* 2.90*  2.69* 2.53* 1.83*  CALCIUM  8.5*  --   --  8.6* 8.6*   LFT Recent Labs    02/20/24 0604  PROT 5.7*  ALBUMIN 2.5*  AST 27  ALT 25  ALKPHOS 103  BILITOT 0.4   PT/INR No results for input(s): LABPROT, INR in the last 72 hours.  STUDIES: CT HEAD WO CONTRAST ( ) Result Date: 02/18/2024 CLINICAL DATA:  Memory loss, increased weakness. EXAM: CT HEAD WITHOUT CONTRAST TECHNIQUE: Contiguous axial images were obtained from the base of the skull through the vertex without intravenous contrast. RADIATION DOSE REDUCTION: This exam was performed according to the departmental dose-optimization program which includes automated exposure control, adjustment of the mA and/or kV according to patient size and/or use of iterative reconstruction technique. COMPARISON:  MRI head 03/22/2023.  CT head 02/12/2016. FINDINGS: Brain: No acute intracranial hemorrhage. No CT evidence of acute infarct. No edema, mass effect, or midline shift. The basilar cisterns are patent. Ventricles: The ventricles are normal. Vascular: No hyperdense vessel or unexpected calcification. Skull: No acute or aggressive finding. Orbits: Left lens replacement.  Orbits are otherwise unremarkable. Sinuses: Small osteoma in the left anterior ethmoid air cells likely involving the frontal sinus outflow tract. Sinuses otherwise unremarkable. Other: Mastoid air cells are clear. IMPRESSION: No CT evidence of acute intracranial abnormality. No disproportionate lobar atrophy. Electronically Signed   By: Donnice Mania M.D.   On: 02/18/2024 22:55   CT RENAL STONE STUDY Result Date: 02/18/2024 CLINICAL DATA:  Acute renal failure EXAM: CT ABDOMEN AND PELVIS WITHOUT CONTRAST TECHNIQUE: Multidetector CT imaging of the abdomen and pelvis was performed following the standard protocol without IV contrast. RADIATION DOSE REDUCTION: This exam was performed according to the departmental dose-optimization program which includes automated exposure control,  adjustment of the mA and/or kV according to patient size and/or use of iterative reconstruction technique. COMPARISON:  MRI abdomen 01/07/2022 FINDINGS: Lower chest: There are mild patchy ground-glass opacities in the lower lobes, right greater than left. Hepatobiliary: There is a cyst in the right lobe of the liver measuring 2.1 cm, decreased in size from prior. No other liver lesions are identified. Gallbladder surgically absent. There is no biliary ductal dilatation. Pancreas: Rounded mildly hyperdense areas seen in the tail the pancreas measuring 8 mm. Otherwise, there is diffuse fatty infiltration of the pancreas. No acute inflammation or ductal dilatation. Spleen: Normal in size without focal abnormality. Adrenals/Urinary Tract: There is mild wall thickening and inflammation of the bladder dome. Kidneys there is a subcentimeter cyst in the left kidney. There is no hydronephrosis or urinary tract calculus. There is left renal cortical scarring.  Adrenal glands are within normal limits. Stomach/Bowel: There is no bowel obstruction, pneumatosis or free air. There is descending and sigmoid colon diverticulosis. The appendix is within normal limits. Stomach and small bowel are within normal limits. Vascular/Lymphatic: There are atherosclerotic calcifications of the aorta and iliac arteries. Aorta is normal in size. No enlarged lymph nodes are identified. There are multiple prominent right perirectal lymph nodes measuring up to 9 mm. Reproductive: Status post hysterectomy. No adnexal masses. Other: Intramuscular lipoma is seen along the lower left anterior abdominal wall measuring 3.0 x 3.0 by 5.5 cm. There is a small fat containing ventral hernia. There is no ascites. Musculoskeletal: No fracture is seen. Family the there is a the mab 1 is L is 100 plates are IMPRESSION: 1. Mild wall thickening and inflammation of the bladder dome compatible with cystitis. 2. Multiple prominent right perirectal lymph nodes measuring  up to 9 mm. These may be reactive or metastatic. Recommend clinical correlation and follow-up. 3. Mild patchy ground-glass opacities in the lower lobes, right greater than left, worrisome for infection or inflammation. 4. 8 mm rounded mildly hyperdense area in the tail the pancreas. Recommend follow-up MRI in 6 months. 5. Colonic diverticulosis. Aortic Atherosclerosis (ICD10-I70.0). Electronically Signed   By: Greig Pique M.D.   On: 02/18/2024 22:51   DG Chest Port 1 View Result Date: 02/18/2024 CLINICAL DATA:  Questionable sepsis EXAM: PORTABLE CHEST 1 VIEW COMPARISON:  Chest x-ray 06/06/2023 FINDINGS: The heart size and mediastinal contours are within normal limits. Both lungs are clear. The visualized skeletal structures are unremarkable. IMPRESSION: No active disease. Electronically Signed   By: Greig Pique M.D.   On: 02/18/2024 18:51    Impression / Plan:   Impression: 1.  Abnormal CT of the abdomen: With pancreatic lesion noted 8 mm as well as rectal lymph nodes with normal colonoscopy in 2021, patient with decreased appetite over the past couple of months, recently started Mounjaro  a month ago, otherwise no change in bowel habits or rectal bleeding 2.  AKI on CKD stage III with hyponatremia and hypokalemia: Suspected prerenal with poor oral intake also on ARB diuretics 3.  Dizziness and weakness: Likely from dehydration and deconditioning 4.  Poor appetite with nausea: CTAP 5.  Anemia: Appears chronic from renal disease 6.  Diabetes type 2: On Mounjaro  7.  History of gout 8.  COPD 9.  Cognitive issues  Plan: 1.  At this point recommend repeat MRI with pancreatic protocol in 3 to 4 months given small pancreatic cyst.  Would also recommend repeat CT of the pelvis given rectal lymph nodes but no GI symptoms, possibly 6 months.  No need for emergent endoscopic workup at this point with recently normal colonoscopy in 2021.  Discussed case with Dr. Wilhelmenia. 2.  We can arrange for  outpatient follow-up as patient believes she is being discharged today  Thank you for your kind consultation, we will sign off.  Delon Gibson [facility]  02/20/2024, 10:38 AM

## 2024-02-20 NOTE — Progress Notes (Signed)
 [facility]  PROGRESS NOTE  Brooke [name] FMW:[id number] DOB: 14-May-1951 DOA: 02/18/2024 PCP: Norleen Lynwood ORN, MD   Brief HPI:   73 y.o. female with history of COPD, ongoing tobacco abuse, and diabetes mellitus type 2, hypertension, chronic kidney disease stage III, gout, hyperlipidemia, GERD was brought to the ER after patient was complaining increasing weakness and dizziness.  As per the son with whom I spoke and patient lives with patient has been feeling dizzy and weakness for the last few weeks she almost passed out once when she was trying to ambulate.  Did not complain of any chest pain or shortness of breath.  Patient states she has been having poor appetite for last few weeks with some nausea one episode of vomiting denies any diarrhea.  Denies any abdominal pain fever or chills.  No recent change in medications.  Patient has been on her Mounjaro  for more than a year now.     Assessment/Plan:   Acute on chronic kidney disease stage III with hyponatremia and hypokalemia-    suspect likely prerenal with poor oral intake.  In addition patient being on ARB diuretics and also was mildly hypotensive.  ARB and hydrochlorothiazide  on hold.  Renal function improving, today creatinine is 1.83. . Dizziness and weakness could be from dehydration and deconditioning.    Did have 20 point drop in systolic pressure on standing   PT evaluation, TSH 1.044.  No dizziness.  Blood pressure has improved  Nausea/anorexia-CT abdomen/pelvis done today shows 8 mm rounded mildly hyperdense area in the tail of pancreas, recommend MRI in 6 months.  Mildly prominent right periductal lymph nodes measuring up to 9 mm could be reactive or metastatic.  CA 19-9 and CEA are normal.  Gastroenterology consulted  Anemia appears to be chronic likely from renal disease.  Anemia panel showed serum iron 39, saturation 21%, ferritin 21, B12 327, folate 10.8  Diabetes mellitus type 2 last hemoglobin A1c was 6.1 about 4 months  ago.  Takes Mounjaro  and Jardiance .  Presently on sliding scale coverage.  CBG well-controlled  Hypertension since patient was hypotensive at presentation but improved with fluids will hold ARB and hydrochlorothiazide  for now and continue Cardizem  and metoprolol .   patient takes metoprolol  twice a day.  Continue metoprolol   History of gout on allopurinol .  COPD not actively wheezing continue trilogy.  Hyperlipidemia on statins, CK level 78  Cognitive issues per patient's son.  Patient's son noticed that patient has a and some difficulty with her memory recently.  Will check B12 327 TSH 1.044 RPR ammonia and CT head is unremarkable.  Abnormal UA with no significant signs of any urinary symptoms at this time.  Check cultures.  CT abdomen shows bladder wall thickening.  Continue empiric Rocephin .  Urine culture growing Klebsiella pneumoniae.  Final result is pending  ?  Pneumonia-CT abdomen shows mild patchy groundglass opacities in the lung bases, consistent with inflammation versus infection.  Patient is currently on Rocephin .  Procalcitonin 0.21.  Does not appear to have pneumonia.      Medications     allopurinol   100 mg Oral Daily   brimonidine   1 drop Left Eye TID   budesonide -glycopyrrolate -formoterol   2 puff Inhalation BID   citalopram   40 mg Oral Daily   diltiazem   240 mg Oral Daily   famotidine   10 mg Oral Daily   heparin   5,000 Units Subcutaneous Q8H   insulin  aspart  0-9 Units Subcutaneous TID WC   latanoprost   1 drop  Left Eye QHS   metoprolol  succinate  100 mg Oral Daily   pantoprazole   40 mg Oral Daily   rosuvastatin   40 mg Oral Daily     Data Reviewed:   CBG:  Recent Labs  Lab 02/19/24 0926 02/19/24 1124 02/19/24 1619 02/19/24 2048 02/20/24 0736  GLUCAP 97 101* 88 97 103*    SpO2: 94 %    Vitals:   02/19/24 1954 02/20/24 0033 02/20/24 0404 02/20/24 0758  BP: (!) 149/76 121/66 (!) 116/96   Pulse: 73 73 75   Resp: 16 16 16    Temp: 98.3 F (36.8 C)  97.6 F (36.4 C) (!) 97.3 F (36.3 C)   TempSrc:      SpO2: 94% 99% 99% 94%  Weight:      Height:          Data Reviewed:  Basic Metabolic Panel: Recent Labs  Lab 02/18/24 1811 02/18/24 1826 02/18/24 2139 02/19/24 0551 02/20/24 0604  NA 134* 137  --  136 138  K 2.7* 2.8*  --  3.2* 3.5  CL 101 103  --  108 109  CO2 24  --   --  20* 20*  GLUCOSE 91 81  --  97 83  BUN 37* 36*  --  32* 22  CREATININE 2.84* 2.90* 2.69* 2.53* 1.83*  CALCIUM  8.5*  --   --  8.6* 8.6*  MG 2.1  --   --   --   --     CBC: Recent Labs  Lab 02/18/24 1811 02/18/24 1826 02/18/24 2139 02/19/24 0551 02/20/24 0604  WBC 8.7  --  9.6 6.5 5.2  NEUTROABS 5.6  --   --   --   --   HGB 11.2* 10.9* 10.2* 10.8* 10.0*  HCT 34.7* 32.0* 32.6* 33.6* 31.9*  MCV 84.0  --  83.8 84.0 86.0  PLT 271  --  250 230 200    LFT Recent Labs  Lab 02/18/24 1811 02/19/24 0551 02/20/24 0604  AST 30 30 27   ALT 26 27 25   ALKPHOS 122 110 103  BILITOT 0.8 0.6 0.4  PROT 6.8 6.0* 5.7*  ALBUMIN 3.0* 2.7* 2.5*     Antibiotics: Anti-infectives (From admission, onward)    Start     Dose/Rate Route Frequency Ordered Stop   02/19/24 2200  cefTRIAXone  (ROCEPHIN ) 1 g in sodium chloride  0.9 % 100 mL IVPB        1 g 200 mL/hr over 30 Minutes Intravenous Every 24 hours 02/18/24 2339     02/18/24 2145  cefTRIAXone  (ROCEPHIN ) 1 g in sodium chloride  0.9 % 100 mL IVPB        1 g 200 mL/hr over 30 Minutes Intravenous  Once 02/18/24 2136 02/18/24 2339        DVT prophylaxis: Heparin   Code Status: Full code  Family Communication: No family at bedside   CONSULTS    Subjective   Urine culture growing greater than 100,000 colonies per mL of Klebsiella pneumoniae.  Patient does complain of dysuria.  Renal function is slowly improving   Objective    Physical Examination:  General-appears in no acute distress Heart-S1-S2, regular, no murmur auscultated Lungs-clear to auscultation bilaterally, no wheezing or  crackles auscultated Abdomen-soft, nontender, no organomegaly Extremities-no edema in the lower extremities Neuro-alert, oriented x3, no focal deficit noted   Status is: Inpatient:             Brooke [name]   [facility] If 7PM-7AM, please contact  night-coverage at [email or web address], Office  [phone]   02/20/2024, 8:22 AM  LOS: 0 days

## 2024-02-20 NOTE — Plan of Care (Signed)
  Problem: Fluid Volume: Goal: Ability to maintain a balanced intake and output will improve Outcome: Progressing   Problem: Nutritional: Goal: Maintenance of adequate nutrition will improve Outcome: Progressing   Problem: Nutrition: Goal: Adequate nutrition will be maintained Outcome: Progressing   Problem: Elimination: Goal: Will not experience complications related to bowel motility Outcome: Progressing   Problem: Safety: Goal: Ability to remain free from injury will improve Outcome: Progressing

## 2024-02-20 NOTE — Progress Notes (Signed)
 Physical Therapy Treatment Patient Details Name: Brooke [name] MRN: [id number] DOB: 1951/06/04 Today's Date: 02/20/2024   History of Present Illness 73 yo female  comes to Ed 02/18/24 with progressive weakness, hyponatremia, hypokalemia, Acute renal failure. PMH:S/P left TKA , R TKA , depression, vertigo, Vtach, syncope,DMII, HTN, Renal artery stenosis.    PT Comments  Pt is progressing toward acute PT goals this session with progression of ambulation distance and to stair training. Pt ambulated ~155ft with supervision with use of rollator and progressed to no AD and close supervision, no overt LOB observed. Pt performed stair negotiation with MIN A for stability and cues for sequencing and safe hand placement. Educated on proper positioning for family members when assisting upon d/c. Pt will benefit from continued skilled PT to increase their independence and maximize safety with mobility.      If plan is discharge home, recommend the following: A little help with walking and/or transfers;Assistance with cooking/housework;Help with stairs or ramp for entrance;A little help with bathing/dressing/bathroom   Can travel by private vehicle        Equipment Recommendations  None recommended by PT (pt reports she has access to rollator and [facility])    Recommendations for Other Services       Precautions / Restrictions Precautions Precautions: Fall Restrictions Weight Bearing Restrictions Per Provider Order: No     Mobility  Bed Mobility Overal bed mobility: Modified Independent Bed Mobility: Supine to Sit     Supine to sit: Modified independent (Device/Increase time)     General bed mobility comments: sitting EOB end of session    Transfers Overall transfer level: Needs assistance Equipment used: Rollator (4 wheels) Transfers: Sit to/from Stand Sit to Stand: Supervision           General transfer comment: Pt reports she has access to rollator and will plan to use that upon  d/c. Rollator trialed during session- pt declined use of RW. preference for rollator.    Ambulation/Gait Ambulation/Gait assistance: Supervision Gait Distance (Feet): 180 Feet Assistive device: Rollator (4 wheels), None Gait Pattern/deviations: Step-through pattern, Decreased stride length, Narrow base of support (intermittent narrow BOS with no AD) Gait velocity: decr     General Gait Details: No knee buckling or overt LOB observed. Trialed use of rollator and no AD halfway through distance. intermittent narrow BOS and mild increase in unsteadiness with no AD. Improved with increased distance. Seated rest break and pt ambuated addiiotnal 24ft to perform stair training and to bathroom upon return to room.   Stairs Stairs: Yes Stairs assistance: Min assist Stair Management: One rail Left, Step to pattern, Forwards Number of Stairs: 2 General stair comments: stair negotiation 2x2. Pt will have multiple family members available to asisst upon d/c- trialed negotiation with no rail and single HHA- pt anxious and reaching for handrail (does not have handrails at home). Reports her stairs at home are not as high. Educated on use of her SPC and HHA or B HHA from family if needed for pt support and comfort. Educated on sequencing up with the good, down with the bad Pt states L LE is stronger to her.   Wheelchair Mobility     Tilt Bed    Modified Rankin (Stroke Patients Only)       Balance Overall balance assessment: Needs assistance Sitting-balance support: No upper extremity supported, Feet supported Sitting balance-Leahy Scale: Normal     Standing balance support: Bilateral upper extremity supported, During functional activity, No upper extremity supported Standing  balance-Leahy Scale: Good Standing balance comment: able to progress to mobility without use of AD                            Communication Communication Communication: No apparent difficulties   Cognition Arousal: Alert Behavior During Therapy: WFL for tasks assessed/performed   PT - Cognitive impairments: No apparent impairments                         Following commands: Intact      Cueing    Exercises      General Comments        Pertinent Vitals/Pain Pain Assessment Pain Assessment: No/denies pain     PT Goals (current goals can now be found in the care plan section) Acute Rehab PT Goals Patient Stated Goal: to go home PT Goal Formulation: With patient Time For Goal Achievement: 03/04/24 Potential to Achieve Goals: Good Progress towards PT goals: Progressing toward goals    Frequency    Min 2X/week       AM-PAC PT 6 Clicks Mobility   Outcome Measure  Help needed turning from your back to your side while in a flat bed without using bedrails?: None Help needed moving from lying on your back to sitting on the side of a flat bed without using bedrails?: None Help needed moving to and from a bed to a chair (including a wheelchair)?: A Little Help needed standing up from a chair using your arms (e.g., wheelchair or bedside chair)?: A Little Help needed to walk in hospital room?: A Little Help needed climbing 3-5 steps with a railing? : A Little 6 Click Score: 20    End of Session Equipment Utilized During Treatment: Gait belt Activity Tolerance: Patient tolerated treatment well Patient left: in bed;with call bell/phone within reach;with bed alarm set Nurse Communication: Mobility status PT Visit Diagnosis: Unsteadiness on feet (R26.81);Muscle weakness (generalized) (M62.81)     Time: 8953-8888 PT Time Calculation (min) (ACUTE ONLY): 25 min  Charges:    $Therapeutic Activity: 23-37 mins PT General Charges $$ ACUTE PT VISIT: 1 Visit                     Tinnie BERRY PT, DPT  Acute Rehabilitation Services  Office [phone]  02/20/2024, 11:39 AM

## 2024-02-20 NOTE — Care Management Obs Status (Signed)
 MEDICARE OBSERVATION STATUS NOTIFICATION   Patient Details  Name: Brooke [name] MRN: [id number] Date of Birth: 1950/10/02   Medicare Observation Status Notification Given:  Yes    Sonda Manuella Quill, RN 02/20/2024, 9:05 AM

## 2024-02-20 NOTE — TOC Initial Note (Signed)
 Transition of Care [facility]) - Initial/Assessment Note    Patient Details  Name: Brooke [name] MRN: [id number] Date of Birth: 12/22/1950  [facility]) CM/SW Contact:    Sonda Manuella Quill, RN Phone Number: 02/20/2024, 10:09 AM  Clinical Narrative:                 TOC for d/c planning; PT recc SNF; Spoke w/ pt in room; pt says she lives at home w/ her son Sullivan Blasing ([phone])jwi family; she plans to return at d/c; pt verified insurance/PCP; pt says she normally uses [facility] for transportation; she denied SDOH risks; pt has cane, and walker; pt says she does not have Hh services, or home oxygen ; pt declined SNF and said she would like to d/c home; pt says she will notify her son; TOC is following.  Expected Discharge Plan: Home/Self Care Barriers to Discharge: Continued Medical Work up   Patient Goals and CMS Choice Patient states their goals for this hospitalization and ongoing recovery are:: home CMS Medicare.gov Compare Post Acute Care list provided to:: Patient   [place] ownership interest in [facility].provided to:: Patient    Expected Discharge Plan and Services   Discharge Planning Services: CM Consult   Living arrangements for the past 2 months: Single Family Home                                      Prior Living Arrangements/Services Living arrangements for the past 2 months: Single Family Home Lives with:: Adult Children Patient language and need for interpreter reviewed:: Yes Do you feel safe going back to the place where you live?: Yes      Need for Family Participation in Patient Care: Yes (Comment) Care giver support system in place?: Yes (comment) Current home services: DME (cane, walker) Criminal Activity/Legal Involvement Pertinent to Current Situation/Hospitalization: No - Comment as needed  Activities of Daily Living   ADL Screening (condition at time of admission) Independently performs ADLs?: Yes (appropriate for  developmental age) Is the patient deaf or have difficulty hearing?: No Does the patient have difficulty seeing, even when wearing glasses/contacts?: No Does the patient have difficulty concentrating, remembering, or making decisions?: No  Permission Sought/Granted Permission sought to share information with : Case Manager Permission granted to share information with : Yes, Verbal Permission Granted  Share Information with NAME: Case Manager     Permission granted to share info w Relationship: Kenya Kook (son) [phone]     Emotional Assessment Appearance:: Appears stated age Attitude/Demeanor/Rapport: Gracious Affect (typically observed): Accepting Orientation: : Oriented to Self, Oriented to Place, Oriented to  Time, Oriented to Situation Alcohol / Substance Use: Not Applicable Psych Involvement: No (comment)  Admission diagnosis:  ARF (acute renal failure) (HCC) [N17.9] Acute cystitis without hematuria [N30.00] Generalized weakness [R53.1] AKI (acute kidney injury) (HCC) [N17.9] Patient Active Problem List   Diagnosis Date Noted   ARF (acute renal failure) (HCC) 02/18/2024   Scalp pain 06/08/2023   Significant closed head trauma within past 3 months 02/25/2023   Chronic cough 02/25/2023   COPD (chronic obstructive pulmonary disease) (HCC) 09/08/2022   Pseudogout of left wrist 08/08/2022   Generalized weakness 05/18/2022   Left ear impacted cerumen 05/16/2022   Left kidney mass 01/14/2022   Peripheral edema 01/14/2022   Left ear hearing loss 12/05/2021   Nasal crusting 12/04/2021   Left ear pain 08/29/2021  Grief 06/05/2021   Acute shoulder bursitis, left 06/05/2021   Bilateral shoulder pain 03/05/2020   Vitamin D  deficiency 12/01/2019   Allergic rhinitis 06/16/2019   Asthma exacerbation 06/16/2019   Dyspnea 01/27/2019   Primary osteoarthritis of left knee 09/30/2018   CKD (chronic kidney disease) stage 3, GFR 30-59 ml/min (HCC) 07/25/2018   Chronic back pain  07/25/2018   Primary osteoarthritis of right knee 05/05/2018   Osteoarthritis of right knee 04/30/2018   Degenerative arthritis of knee, bilateral 08/07/2017   Depression with anxiety 07/19/2017   Vertigo 01/12/2017   Low back pain 01/12/2017   SOB (shortness of breath) 12/02/2016   Dysuria 09/12/2016   Bilateral knee pain 02/15/2015   Effusion of right knee 01/28/2015   Asthma with exacerbation 11/09/2014   Pronation deformity of both feet 09/29/2014   Greater trochanteric bursitis of left hip 08/31/2014   Degenerative arthritis of left knee 05/18/2014   Ventricular tachycardia, polymorphic (HCC) 01/04/2014   Abnormal MRI of head 12/04/2013   Syncope 11/06/2013   Chest pain 11/06/2013   Headache 11/06/2013   Nausea with vomiting 11/06/2013   Hypokalemia 08/18/2012   Diastolic dysfunction 01/15/2011   Leukocytosis 01/03/2011   Toe pain 12/20/2010   Encounter for well adult exam with abnormal findings 12/19/2010   FATIGUE 09/05/2009   Diabetes (HCC) 11/29/2008   Gout 08/09/2008   Normochromic normocytic anemia 02/09/2008   GERD 02/09/2008   ALOPECIA 12/30/2007   Hyperlipidemia 10/01/2007   Essential hypertension 02/26/2007   History of colonic polyps 02/26/2007   Goiter 09/19/2006   Asthma 09/19/2006   ECZEMA, ATOPIC DERMATITIS 09/19/2006   Insomnia 09/19/2006   PCP:  Norleen Lynwood ORN, MD Pharmacy:   [facility] [phone] - [place],  - [address] AT [facility] [address] [place] [address] Phone: [phone] Fax: [phone]  [id number] [place], [place] - [address] AT [facility] [address] [place] [address] Phone: [phone] Fax: [phone]     Social Drivers of Health (SDOH) Social History: SDOH Screenings   Food Insecurity: No Food Insecurity (02/20/2024)  Housing: Low Risk  (02/20/2024)  Transportation Needs: No Transportation Needs (02/20/2024)  Utilities: Not At  Risk (02/20/2024)  Alcohol Screen: Low Risk  (09/24/2023)  Depression (PHQ2-9): Low Risk  (10/02/2023)  Financial Resource Strain: Low Risk  (09/24/2023)  Physical Activity: Insufficiently Active (09/24/2023)  Social Connections: Moderately Isolated (02/19/2024)  Stress: No Stress Concern Present (09/24/2023)  Tobacco Use: Low Risk  (02/18/2024)  Health Literacy: Adequate Health Literacy (09/24/2023)   SDOH Interventions: Food Insecurity Interventions: Intervention Not Indicated, Inpatient TOC Housing Interventions: Intervention Not Indicated, Inpatient TOC Transportation Interventions: Intervention Not Indicated, Inpatient TOC Utilities Interventions: Intervention Not Indicated, Inpatient TOC   Readmission Risk Interventions     No data to display

## 2024-02-21 ENCOUNTER — Other Ambulatory Visit (HOSPITAL_BASED_OUTPATIENT_CLINIC_OR_DEPARTMENT_OTHER): Payer: Self-pay

## 2024-02-21 ENCOUNTER — Other Ambulatory Visit (HOSPITAL_COMMUNITY): Payer: Self-pay

## 2024-02-21 DIAGNOSIS — M109 Gout, unspecified: Secondary | ICD-10-CM

## 2024-02-21 DIAGNOSIS — N183 Chronic kidney disease, stage 3 unspecified: Secondary | ICD-10-CM | POA: Diagnosis not present

## 2024-02-21 DIAGNOSIS — N179 Acute kidney failure, unspecified: Secondary | ICD-10-CM | POA: Diagnosis not present

## 2024-02-21 DIAGNOSIS — N3000 Acute cystitis without hematuria: Secondary | ICD-10-CM | POA: Diagnosis not present

## 2024-02-21 LAB — URINE CULTURE: Culture: >=100000 — AB

## 2024-02-21 LAB — CBC
HCT: 31.8 % — ABNORMAL LOW (ref 36.0–46.0)
Hemoglobin: 9.8 g/dL — ABNORMAL LOW (ref 12.0–15.0)
MCH: 26.8 pg (ref 26.0–34.0)
MCHC: 30.8 g/dL (ref 30.0–36.0)
MCV: 87.1 fL (ref 80.0–100.0)
Platelets: 219 K/uL (ref 150–400)
RBC: 3.65 MIL/uL — ABNORMAL LOW (ref 3.87–5.11)
RDW: 15.2 % (ref 11.5–15.5)
WBC: 6.7 K/uL (ref 4.0–10.5)
nRBC: 0 % (ref 0.0–0.2)

## 2024-02-21 LAB — COMPREHENSIVE METABOLIC PANEL WITH GFR
ALT: 27 U/L (ref 0–44)
AST: 26 U/L (ref 15–41)
Albumin: 2.4 g/dL — ABNORMAL LOW (ref 3.5–5.0)
Alkaline Phosphatase: 98 U/L (ref 38–126)
Anion gap: 8 (ref 5–15)
BUN: 18 mg/dL (ref 8–23)
CO2: 20 mmol/L — ABNORMAL LOW (ref 22–32)
Calcium: 8.3 mg/dL — ABNORMAL LOW (ref 8.9–10.3)
Chloride: 109 mmol/L (ref 98–111)
Creatinine, Ser: 1.57 mg/dL — ABNORMAL HIGH (ref 0.44–1.00)
GFR, Estimated: 35 mL/min — ABNORMAL LOW (ref >60)
Glucose, Bld: 76 mg/dL (ref 70–99)
Potassium: 3.2 mmol/L — ABNORMAL LOW (ref 3.5–5.1)
Sodium: 137 mmol/L (ref 135–145)
Total Bilirubin: 0.3 mg/dL (ref 0.0–1.2)
Total Protein: 5.6 g/dL — ABNORMAL LOW (ref 6.5–8.1)

## 2024-02-21 LAB — GLUCOSE, CAPILLARY
Glucose-Capillary: 93 mg/dL (ref 70–99)
Glucose-Capillary: 99 mg/dL (ref 70–99)

## 2024-02-21 MED ORDER — METOPROLOL SUCCINATE ER 100 MG PO TB24
100.0000 mg | ORAL_TABLET | Freq: Every day | ORAL | 2 refills | Status: DC
  Filled 2024-02-21: qty 30, 30d supply, fill #0

## 2024-02-21 MED ORDER — CEPHALEXIN 500 MG PO CAPS
500.0000 mg | ORAL_CAPSULE | Freq: Two times a day (BID) | ORAL | 0 refills | Status: AC
  Filled 2024-02-21: qty 10, 5d supply, fill #0

## 2024-02-21 MED ORDER — POTASSIUM CHLORIDE CRYS ER 20 MEQ PO TBCR
40.0000 meq | EXTENDED_RELEASE_TABLET | Freq: Once | ORAL | Status: AC
  Administered 2024-02-21: 40 meq via ORAL
  Filled 2024-02-21: qty 2

## 2024-02-21 NOTE — Progress Notes (Incomplete)
 [facility]  PROGRESS NOTE  Brooke [name] FMW:[id number] DOB: 07/25/1950 DOA: 02/18/2024 PCP: Norleen Lynwood ORN, MD   Brief HPI:   73 y.o. female with history of COPD, ongoing tobacco abuse, and diabetes mellitus type 2, hypertension, chronic kidney disease stage III, gout, hyperlipidemia, GERD was brought to the ER after patient was complaining increasing weakness and dizziness.  As per the son with whom I spoke and patient lives with patient has been feeling dizzy and weakness for the last few weeks she almost passed out once when she was trying to ambulate.  Did not complain of any chest pain or shortness of breath.  Patient states she has been having poor appetite for last few weeks with some nausea one episode of vomiting denies any diarrhea.  Denies any abdominal pain fever or chills.  No recent change in medications.  Patient has been on her Mounjaro  for more than a year now.     Assessment/Plan:   Acute on chronic kidney disease stage III with hyponatremia and hypokalemia-    suspect likely prerenal with poor oral intake.  In addition patient being on ARB diuretics and also was mildly hypotensive.  ARB and hydrochlorothiazide  on hold.  Renal function improving, today creatinine is 1.83. . Dizziness and weakness could be from dehydration and deconditioning.    Did have 20 point drop in systolic pressure on standing   PT evaluation, TSH 1.044.  No dizziness.  Blood pressure has improved  Nausea/anorexia-CT abdomen/pelvis done today shows 8 mm rounded mildly hyperdense area in the tail of pancreas, recommend MRI in 6 months.  Mildly prominent right periductal lymph nodes measuring up to 9 mm could be reactive or metastatic.  CA 19-9 and CEA are normal.  Gastroenterology consulted  Anemia appears to be chronic likely from renal disease.  Anemia panel showed serum iron 39, saturation 21%, ferritin 21, B12 327, folate 10.8  Diabetes mellitus type 2 last hemoglobin A1c was 6.1 about 4 months  ago.  Takes Mounjaro  and Jardiance .  Presently on sliding scale coverage.  CBG well-controlled  Hypertension since patient was hypotensive at presentation but improved with fluids will hold ARB and hydrochlorothiazide  for now and continue Cardizem  and metoprolol .   patient takes metoprolol  twice a day.  Continue metoprolol   History of gout on allopurinol .  COPD not actively wheezing continue trilogy.  Hyperlipidemia on statins, CK level 78  Cognitive issues per patient's son.  Patient's son noticed that patient has a and some difficulty with her memory recently.  Will check B12 327 TSH 1.044 RPR ammonia and CT head is unremarkable.  Abnormal UA with no significant signs of any urinary symptoms at this time.  Check cultures.  CT abdomen shows bladder wall thickening.  Continue empiric Rocephin .  Urine culture growing Klebsiella pneumoniae.  Final result is pending  ?  Pneumonia-CT abdomen shows mild patchy groundglass opacities in the lung bases, consistent with inflammation versus infection.  Patient is currently on Rocephin .  Procalcitonin 0.21.  Does not appear to have pneumonia.      Medications     allopurinol   100 mg Oral Daily   brimonidine   1 drop Left Eye TID   budesonide -glycopyrrolate -formoterol   2 puff Inhalation BID   citalopram   40 mg Oral Daily   diltiazem   240 mg Oral Daily   famotidine   10 mg Oral Daily   heparin   5,000 Units Subcutaneous Q8H   insulin  aspart  0-9 Units Subcutaneous TID WC   latanoprost   1 drop  Left Eye QHS   metoprolol  succinate  100 mg Oral Daily   pantoprazole   40 mg Oral Daily   potassium chloride   40 mEq Oral Once   rosuvastatin   40 mg Oral Daily     Data Reviewed:   CBG:  Recent Labs  Lab 02/20/24 0736 02/20/24 1115 02/20/24 1631 02/20/24 2058 02/21/24 0714  GLUCAP 103* 113* 110* 99 93    SpO2: 99 %    Vitals:   02/20/24 0758 02/20/24 1402 02/20/24 1932 02/21/24 0431  BP:  (!) 159/79 (!) 142/71 (!) 151/73  Pulse:  74 76 80   Resp:  18 16 16   Temp:  97.7 F (36.5 C) 98 F (36.7 C) 98.2 F (36.8 C)  TempSrc:      SpO2: 94% 97% 96% 99%  Weight:      Height:          Data Reviewed:  Basic Metabolic Panel: Recent Labs  Lab 02/18/24 1811 02/18/24 1826 02/18/24 2139 02/19/24 0551 02/20/24 0604 02/21/24 0520  NA 134* 137  --  136 138 137  K 2.7* 2.8*  --  3.2* 3.5 3.2*  CL 101 103  --  108 109 109  CO2 24  --   --  20* 20* 20*  GLUCOSE 91 81  --  97 83 76  BUN 37* 36*  --  32* 22 18  CREATININE 2.84* 2.90* 2.69* 2.53* 1.83* 1.57*  CALCIUM  8.5*  --   --  8.6* 8.6* 8.3*  MG 2.1  --   --   --   --   --     CBC: Recent Labs  Lab 02/18/24 1811 02/18/24 1826 02/18/24 2139 02/19/24 0551 02/20/24 0604 02/21/24 0520  WBC 8.7  --  9.6 6.5 5.2 6.7  NEUTROABS 5.6  --   --   --   --   --   HGB 11.2* 10.9* 10.2* 10.8* 10.0* 9.8*  HCT 34.7* 32.0* 32.6* 33.6* 31.9* 31.8*  MCV 84.0  --  83.8 84.0 86.0 87.1  PLT 271  --  250 230 200 219    LFT Recent Labs  Lab 02/18/24 1811 02/19/24 0551 02/20/24 0604 02/21/24 0520  AST 30 30 27 26   ALT 26 27 25 27   ALKPHOS 122 110 103 98  BILITOT 0.8 0.6 0.4 0.3  PROT 6.8 6.0* 5.7* 5.6*  ALBUMIN 3.0* 2.7* 2.5* 2.4*     Antibiotics: Anti-infectives (From admission, onward)    Start     Dose/Rate Route Frequency Ordered Stop   02/19/24 2200  cefTRIAXone  (ROCEPHIN ) 1 g in sodium chloride  0.9 % 100 mL IVPB        1 g 200 mL/hr over 30 Minutes Intravenous Every 24 hours 02/18/24 2339     02/18/24 2145  cefTRIAXone  (ROCEPHIN ) 1 g in sodium chloride  0.9 % 100 mL IVPB        1 g 200 mL/hr over 30 Minutes Intravenous  Once 02/18/24 2136 02/18/24 2339        DVT prophylaxis: Heparin   Code Status: Full code  Family Communication: No family at bedside   CONSULTS    Subjective     Objective    Physical Examination:     Status is: Inpatient:             Brooke [name] Brod   [facility] If 7PM-7AM, please contact  night-coverage at [email or web address], Office  [phone]   02/21/2024, 8:14 AM  LOS: 1 day

## 2024-02-21 NOTE — Discharge Summary (Addendum)
 Physician Discharge Summary   Patient: Brooke [name] MRN: [id number] DOB: 12-19-1950  Admit date:     02/18/2024  Discharge date: 02/21/24  Discharge Physician: [name]   PCP: Norleen Lynwood ORN, MD   Recommendations at discharge:   Follow-up with nephrology as outpatient Follow-up PCP as outpatient Stop hydrochlorothiazide , telmisartan  Dose of metoprolol  changed to 100 mg once daily Continue Keflex  500 mg p.o. twice daily for 5 more days  Discharge Diagnoses: Principal Problem:   ARF (acute renal failure) (HCC) Active Problems:   Hyperlipidemia   Gout   Normochromic normocytic anemia   Essential hypertension   Asthma   CKD (chronic kidney disease) stage 3, GFR 30-59 ml/min (HCC)   COPD (chronic obstructive pulmonary disease) (HCC)   AKI (acute kidney injury) (HCC)  Resolved Problems:   * No resolved hospital problems. *  Hospital Course: 73 y.o. female with history of COPD, ongoing tobacco abuse, and diabetes mellitus type 2, hypertension, chronic kidney disease stage III, gout, hyperlipidemia, GERD was brought to the ER after patient was complaining increasing weakness and dizziness.  As per the son with whom I spoke and patient lives with patient has been feeling dizzy and weakness for the last few weeks she almost passed out once when she was trying to ambulate.  Did not complain of any chest pain or shortness of breath.  Patient states she has been having poor appetite for last few weeks with some nausea one episode of vomiting denies any diarrhea.  Denies any abdominal pain fever or chills.  No recent change in medications.  Patient has been on her Mounjaro  for more than a year now.     Assessment and Plan:  Acute on chronic kidney disease stage III -   significantly improved with hydration and stopping telmisartan  and HCTZ.  Creatinine improved to 1.57.  Will discontinue hydrochlorothiazide  and telmisartan  on discharge.  Hypotension also improved.  Will continue to  follow-up with PCP and nephrology as outpatient.   Dizziness and weakness could be from dehydration and deconditioning.    Positive orthostatic vital signs, improved after stopping diuretics and ARB as above.  PT evaluation obtained, patient to go home with home health PT.  TSH 1.044.     Nausea/anorexia-CT abdomen/pelvis done today shows 8 mm rounded mildly hyperdense area in the tail of pancreas, recommend MRI in 6 months.  Mildly prominent right periductal lymph nodes measuring up to 9 mm could be reactive or metastatic.  CA 19-9 and CEA are normal.  Gastroenterology consulted.  Plan to follow-up with gastroenterology as outpatient   Anemia appears to be chronic likely from renal disease.  Anemia panel showed serum iron 39, saturation 21%, ferritin 21, B12 327, folate 10.8   Diabetes mellitus type 2 last hemoglobin A1c was 6.1 about 4 months ago.  Takes Mounjaro  and Jardiance .     Hypertension since patient was hypotensive at presentation but improved with fluids will hold ARB and hydrochlorothiazide  for now and continue Cardizem  and metoprolol .   patient takes metoprolol  twice a day.  Continue metoprolol  100 mg p.o. daily.   History of gout on allopurinol .   COPD not actively wheezing continue trilogy.   Hyperlipidemia on statins, CK level 78   Cognitive issues per patient's son.  Patient's son noticed that patient has a and some difficulty with her memory recently.  Will check B12 327 TSH 1.044 RPR ammonia and CT head is unremarkable.  Likely in setting of UTI.  Patient started on antibiotics  Abnormal UA with no significant signs of any urinary symptoms at this time.  Check cultures.  CT abdomen shows bladder wall thickening.  She was started on empiric Rocephin , received Rocephin  for 3 days. .  Urine culture growing Klebsiella pneumoniae.  Will discharge on Keflex  500 mg p.o. twice daily for 5 more days.   ?  Pneumonia-CT abdomen shows mild patchy groundglass opacities in the lung  bases, consistent with inflammation versus infection.  Patient is currently on Rocephin .  Procalcitonin 0.21.  Does not appear to have pneumonia.           Consultants:  Procedures performed:  Disposition: Home Diet recommendation:  Discharge Diet Orders (From admission, onward)     Start     Ordered   02/21/24 0000  Diet - low sodium heart healthy        02/21/24 1100           Regular diet DISCHARGE MEDICATION: Allergies as of 02/21/2024       Reactions   Metformin  And Related Nausea And Vomiting   Ace Inhibitors Swelling      Codeine Nausea And Vomiting, Rash   Hydrocodone  Nausea And Vomiting   Tramadol  Nausea And Vomiting   Tylenol  [acetaminophen ] Itching, Rash   Allergic to prescription strength tylenol          Medication List     STOP taking these medications    hydrochlorothiazide  12.5 MG capsule Commonly known as: MICROZIDE    oxybutynin  5 MG tablet Commonly known as: DITROPAN    potassium chloride  SA 20 MEQ tablet Commonly known as: KLOR-CON  M   telmisartan  80 MG tablet Commonly known as: MICARDIS        TAKE these medications    acetaminophen  325 MG tablet Commonly known as: TYLENOL  Take 650 mg by mouth every 6 (six) hours as needed for mild pain.   acetaZOLAMIDE 250 MG tablet Commonly known as: DIAMOX Take by mouth.   albuterol  108 (90 Base) MCG/ACT inhaler Commonly known as: VENTOLIN  HFA Inhale 2 puffs into the lungs every 4 (four) hours as needed for wheezing or shortness of breath.   allopurinol  100 MG tablet Commonly known as: ZYLOPRIM  TAKE 1 TABLET BY MOUTH EVERY DAY   atropine 1 % ophthalmic solution Place 1 drop into the left eye daily.   azelastine  0.1 % nasal spray Commonly known as: ASTELIN  Place 2 sprays into both nostrils 2 (two) times daily. Use in each nostril as directed   brimonidine  0.2 % ophthalmic solution Commonly known as: ALPHAGAN  3 (three) times daily. 1 drop in the left eye three times daily    cephALEXin  500 MG capsule Commonly known as: KEFLEX  Take 1 capsule (500 mg total) by mouth 2 (two) times daily for 5 days.   cetirizine  10 MG tablet Commonly known as: ZYRTEC  Take 1 tablet (10 mg total) by mouth daily.   citalopram  40 MG tablet Commonly known as: CELEXA  TAKE 1 TABLET BY MOUTH EVERY DAY   clonazePAM  0.5 MG tablet Commonly known as: KLONOPIN  Take 1 tablet (0.5 mg total) by mouth daily as needed (vertigo).   colchicine  0.6 MG tablet TAKE 0.5 TABLETS (0.3 MG TOTAL) BY MOUTH DAILY AS NEEDED (GOUT OR PSUEDOGOUT PAIN).   diltiazem  240 MG 24 hr capsule Commonly known as: CARDIZEM  CD Take 1 capsule (240 mg total) by mouth daily.   dorzolamide  2 % ophthalmic solution Commonly known as: TRUSOPT  Place 1 drop into the left eye 3 (three) times daily.   famotidine  20 MG tablet  Commonly known as: Pepcid  Take 1 tablet (20 mg total) by mouth 2 (two) times daily.   fexofenadine  180 MG tablet Commonly known as: ALLEGRA  Take 1 tablet (180 mg total) by mouth daily.   Jardiance  25 MG Tabs tablet Generic drug: empagliflozin  TAKE 1 TABLET BY MOUTH EVERY DAY BEFORE BREAKFAST   latanoprost  0.005 % ophthalmic solution Commonly known as: XALATAN  Place 1 drop into the left eye at bedtime.   meclizine  25 MG tablet Commonly known as: ANTIVERT  TAKE 1 TABLET BY MOUTH 3 TIMES A DAY AS NEEDED FOR DIZZINESS   metoprolol  succinate 100 MG 24 hr tablet Commonly known as: TOPROL -XL Take 1 tablet (100 mg total) by mouth daily. TAKE 1 TABLET BY MOUTH DAILY. TAKE WITH OR IMMEDIATELY FOLLOWING A MEAL. What changed:  See the new instructions. Another medication with the same name was removed. Continue taking this medication, and follow the directions you see here.   omeprazole  40 MG capsule Commonly known as: PRILOSEC Take 1 capsule (40 mg total) by mouth daily.   rosuvastatin  40 MG tablet Commonly known as: CRESTOR  TAKE 1 TABLET BY MOUTH EVERY DAY   tirzepatide  2.5 MG/0.5ML  Pen Commonly known as: MOUNJARO  Inject 2.5 mg into the skin once a week.   traZODone  100 MG tablet Commonly known as: DESYREL  Take 1 tablet (100 mg total) by mouth at bedtime as needed. for sleep What changed: when to take this   Trelegy Ellipta  200-62.5-25 MCG/ACT Aepb Generic drug: Fluticasone -Umeclidin-Vilant Inhale 1 puff into the lungs daily.        Follow-up Information     Norleen Lynwood ORN, MD Follow up in 1 week(s).   Specialties: Internal Medicine, Radiology Contact information: [address] [place] [address] [phone]         Dennise Hoes, MD. Schedule an appointment as soon as possible for a visit.   Specialty: Nephrology Contact information: [address] [place] [address] [phone]         Avram Lupita BRAVO, MD. Schedule an appointment as soon as possible for a visit.   Specialty: Gastroenterology Contact information: [address] [place] [address] [phone]                Discharge Exam: Fredricka Weights   02/18/24 1757  Weight: 83 kg   General-appears in no acute distress Heart-S1-S2, regular, no murmur auscultated Lungs-clear to auscultation bilaterally, no wheezing or crackles auscultated Abdomen-soft, nontender, no organomegaly Extremities-no edema in the lower extremities Neuro-alert, oriented x3, no focal deficit noted  Condition at discharge: good  The results of significant diagnostics from this hospitalization (including imaging, microbiology, ancillary and laboratory) are listed below for reference.   Imaging Studies: CT HEAD WO CONTRAST ( ) Result Date: 02/18/2024 CLINICAL DATA:  Memory loss, increased weakness. EXAM: CT HEAD WITHOUT CONTRAST TECHNIQUE: Contiguous axial images were obtained from the base of the skull through the vertex without intravenous contrast. RADIATION DOSE REDUCTION: This exam was performed according to the departmental dose-optimization program which includes automated  exposure control, adjustment of the mA and/or kV according to patient size and/or use of iterative reconstruction technique. COMPARISON:  MRI head 03/22/2023.  CT head 02/12/2016. FINDINGS: Brain: No acute intracranial hemorrhage. No CT evidence of acute infarct. No edema, mass effect, or midline shift. The basilar cisterns are patent. Ventricles: The ventricles are normal. Vascular: No hyperdense vessel or unexpected calcification. Skull: No acute or aggressive finding. Orbits: Left lens replacement.  Orbits are otherwise unremarkable. Sinuses: Small osteoma in  the left anterior ethmoid air cells likely involving the frontal sinus outflow tract. Sinuses otherwise unremarkable. Other: Mastoid air cells are clear. IMPRESSION: No CT evidence of acute intracranial abnormality. No disproportionate lobar atrophy. Electronically Signed   By: Donnice Mania M.D.   On: 02/18/2024 22:55   CT RENAL STONE STUDY Result Date: 02/18/2024 CLINICAL DATA:  Acute renal failure EXAM: CT ABDOMEN AND PELVIS WITHOUT CONTRAST TECHNIQUE: Multidetector CT imaging of the abdomen and pelvis was performed following the standard protocol without IV contrast. RADIATION DOSE REDUCTION: This exam was performed according to the departmental dose-optimization program which includes automated exposure control, adjustment of the mA and/or kV according to patient size and/or use of iterative reconstruction technique. COMPARISON:  MRI abdomen 01/07/2022 FINDINGS: Lower chest: There are mild patchy ground-glass opacities in the lower lobes, right greater than left. Hepatobiliary: There is a cyst in the right lobe of the liver measuring 2.1 cm, decreased in size from prior. No other liver lesions are identified. Gallbladder surgically absent. There is no biliary ductal dilatation. Pancreas: Rounded mildly hyperdense areas seen in the tail the pancreas measuring 8 mm. Otherwise, there is diffuse fatty infiltration of the pancreas. No acute inflammation  or ductal dilatation. Spleen: Normal in size without focal abnormality. Adrenals/Urinary Tract: There is mild wall thickening and inflammation of the bladder dome. Kidneys there is a subcentimeter cyst in the left kidney. There is no hydronephrosis or urinary tract calculus. There is left renal cortical scarring. Adrenal glands are within normal limits. Stomach/Bowel: There is no bowel obstruction, pneumatosis or free air. There is descending and sigmoid colon diverticulosis. The appendix is within normal limits. Stomach and small bowel are within normal limits. Vascular/Lymphatic: There are atherosclerotic calcifications of the aorta and iliac arteries. Aorta is normal in size. No enlarged lymph nodes are identified. There are multiple prominent right perirectal lymph nodes measuring up to 9 mm. Reproductive: Status post hysterectomy. No adnexal masses. Other: Intramuscular lipoma is seen along the lower left anterior abdominal wall measuring 3.0 x 3.0 by 5.5 cm. There is a small fat containing ventral hernia. There is no ascites. Musculoskeletal: No fracture is seen. Family the there is a the mab 1 is L is 100 plates are IMPRESSION: 1. Mild wall thickening and inflammation of the bladder dome compatible with cystitis. 2. Multiple prominent right perirectal lymph nodes measuring up to 9 mm. These may be reactive or metastatic. Recommend clinical correlation and follow-up. 3. Mild patchy ground-glass opacities in the lower lobes, right greater than left, worrisome for infection or inflammation. 4. 8 mm rounded mildly hyperdense area in the tail the pancreas. Recommend follow-up MRI in 6 months. 5. Colonic diverticulosis. Aortic Atherosclerosis (ICD10-I70.0). Electronically Signed   By: Greig Pique M.D.   On: 02/18/2024 22:51   DG Chest Port 1 View Result Date: 02/18/2024 CLINICAL DATA:  Questionable sepsis EXAM: PORTABLE CHEST 1 VIEW COMPARISON:  Chest x-ray 06/06/2023 FINDINGS: The heart size and mediastinal  contours are within normal limits. Both lungs are clear. The visualized skeletal structures are unremarkable. IMPRESSION: No active disease. Electronically Signed   By: Greig Pique M.D.   On: 02/18/2024 18:51    Microbiology: Results for orders placed or performed during the hospital encounter of 02/18/24  Resp panel by RT-PCR (RSV, Flu A&B, Covid) Anterior Nasal Swab     Status: None   Collection Time: 02/18/24  6:11 PM   Specimen: Anterior Nasal Swab  Result Value Ref Range Status   SARS Coronavirus 2 by  RT PCR NEGATIVE NEGATIVE Final    Comment: (NOTE) SARS-CoV-2 target nucleic acids are NOT DETECTED.  The SARS-CoV-2 RNA is generally detectable in upper respiratory specimens during the acute phase of infection. The lowest concentration of SARS-CoV-2 viral copies this assay can detect is 138 copies/mL. A negative result does not preclude SARS-Cov-2 infection and should not be used as the sole basis for treatment or other patient management decisions. A negative result may occur with  improper specimen collection/handling, submission of specimen other than nasopharyngeal swab, presence of viral mutation(s) within the areas targeted by this assay, and inadequate number of viral copies(<138 copies/mL). A negative result must be combined with clinical observations, patient history, and epidemiological information. The expected result is Negative.  Fact Sheet for Patients:  BloggerCourse.com  Fact Sheet for Healthcare Providers:  SeriousBroker.it  This test is no t yet approved or cleared by the [place]  FDA and  has been authorized for detection and/or diagnosis of SARS-CoV-2 by FDA under an Emergency Use Authorization (EUA). This EUA will remain  in effect (meaning this test can be used) for the duration of the COVID-19 declaration under Section 564(b)(1) of the Act, 21 U.S.C.section 360bbb-3(b)(1), unless the authorization  is terminated  or revoked sooner.       Influenza A by PCR NEGATIVE NEGATIVE Final   Influenza B by PCR NEGATIVE NEGATIVE Final    Comment: (NOTE) The Xpert Xpress SARS-CoV-2/FLU/RSV plus assay is intended as an aid in the diagnosis of influenza from Nasopharyngeal swab specimens and should not be used as a sole basis for treatment. Nasal washings and aspirates are unacceptable for Xpert Xpress SARS-CoV-2/FLU/RSV testing.  Fact Sheet for Patients: BloggerCourse.com  Fact Sheet for Healthcare Providers: SeriousBroker.it  This test is not yet approved or cleared by the [place]  FDA and has been authorized for detection and/or diagnosis of SARS-CoV-2 by FDA under an Emergency Use Authorization (EUA). This EUA will remain in effect (meaning this test can be used) for the duration of the COVID-19 declaration under Section 564(b)(1) of the Act, 21 U.S.C. section 360bbb-3(b)(1), unless the authorization is terminated or revoked.     Resp Syncytial Virus by PCR NEGATIVE NEGATIVE Final    Comment: (NOTE) Fact Sheet for Patients: BloggerCourse.com  Fact Sheet for Healthcare Providers: SeriousBroker.it  This test is not yet approved or cleared by the [place]  FDA and has been authorized for detection and/or diagnosis of SARS-CoV-2 by FDA under an Emergency Use Authorization (EUA). This EUA will remain in effect (meaning this test can be used) for the duration of the COVID-19 declaration under Section 564(b)(1) of the Act, 21 U.S.C. section 360bbb-3(b)(1), unless the authorization is terminated or revoked.  Performed at [facility], [address] [place], [place] [address]   Blood Culture (routine x 2)     Status: Abnormal   Collection Time: 02/18/24  6:11 PM   Specimen: BLOOD  Result Value Ref Range Status   Specimen Description   Final    BLOOD  LEFT ANTECUBITAL Performed at [facility], [address] [place], [place] [address]    Special Requests   Final    BOTTLES DRAWN AEROBIC AND ANAEROBIC Blood Culture results may not be optimal due to an inadequate volume of blood received in culture bottles Performed at [facility], [address] [place], [place] [address]    Culture  Setup Time   Final    GRAM POSITIVE COCCI IN CLUSTERS ANAEROBIC BOTTLE  ONLY CRITICAL RESULT CALLED TO, READ BACK BY AND VERIFIED WITH: PHARMD S. DAVIS [id number] @ 2028 FH    Culture (A)  Final    STAPHYLOCOCCUS EPIDERMIDIS THE SIGNIFICANCE OF ISOLATING THIS ORGANISM FROM A SINGLE SET OF BLOOD CULTURES WHEN MULTIPLE SETS ARE DRAWN IS UNCERTAIN. PLEASE NOTIFY THE MICROBIOLOGY DEPARTMENT WITHIN ONE WEEK IF SPECIATION AND SENSITIVITIES ARE REQUIRED. Performed at [facility], [address] [place], [place] [address]    Report Status 02/20/2024 FINAL  Final  Blood Culture ID Panel (Reflexed)     Status: Abnormal   Collection Time: 02/18/24  6:11 PM  Result Value Ref Range Status   Enterococcus faecalis NOT DETECTED NOT DETECTED Final   Enterococcus Faecium NOT DETECTED NOT DETECTED Final   Listeria monocytogenes NOT DETECTED NOT DETECTED Final   Staphylococcus species DETECTED (A) NOT DETECTED Final    Comment: CRITICAL RESULT CALLED TO, READ BACK BY AND VERIFIED WITH: PHARMD S. DAVIS [id number] @ 2028 FH    Staphylococcus aureus (BCID) NOT DETECTED NOT DETECTED Final   Staphylococcus epidermidis DETECTED (A) NOT DETECTED Final    Comment: CRITICAL RESULT CALLED TO, READ BACK BY AND VERIFIED WITH: MAYA CANDIE NICHOLAUS LORINE @ 2028 FH    Staphylococcus lugdunensis NOT DETECTED NOT DETECTED Final   Streptococcus species NOT DETECTED NOT DETECTED Final   Streptococcus agalactiae NOT DETECTED NOT DETECTED Final   Streptococcus pneumoniae NOT DETECTED NOT DETECTED Final   Streptococcus pyogenes NOT DETECTED NOT DETECTED  Final   A.calcoaceticus-baumannii NOT DETECTED NOT DETECTED Final   Bacteroides fragilis NOT DETECTED NOT DETECTED Final   Enterobacterales NOT DETECTED NOT DETECTED Final   Enterobacter cloacae complex NOT DETECTED NOT DETECTED Final   Escherichia coli NOT DETECTED NOT DETECTED Final   Klebsiella aerogenes NOT DETECTED NOT DETECTED Final   Klebsiella oxytoca NOT DETECTED NOT DETECTED Final   Klebsiella pneumoniae NOT DETECTED NOT DETECTED Final   Proteus species NOT DETECTED NOT DETECTED Final   Salmonella species NOT DETECTED NOT DETECTED Final   Serratia marcescens NOT DETECTED NOT DETECTED Final   Haemophilus influenzae NOT DETECTED NOT DETECTED Final   Neisseria meningitidis NOT DETECTED NOT DETECTED Final   Pseudomonas aeruginosa NOT DETECTED NOT DETECTED Final   Stenotrophomonas maltophilia NOT DETECTED NOT DETECTED Final   Candida albicans NOT DETECTED NOT DETECTED Final   Candida auris NOT DETECTED NOT DETECTED Final   Candida glabrata NOT DETECTED NOT DETECTED Final   Candida krusei NOT DETECTED NOT DETECTED Final   Candida parapsilosis NOT DETECTED NOT DETECTED Final   Candida tropicalis NOT DETECTED NOT DETECTED Final   Cryptococcus neoformans/gattii NOT DETECTED NOT DETECTED Final   Methicillin resistance mecA/C NOT DETECTED NOT DETECTED Final    Comment: Performed at [facility], [address], [place], [place] [address]  Blood Culture (routine x 2)     Status: None (Preliminary result)   Collection Time: 02/18/24  6:21 PM   Specimen: BLOOD  Result Value Ref Range Status   Specimen Description   Final    BLOOD PICC LINE Performed at [facility], [address], [place], [place] [address]    Special Requests   Final    BOTTLES DRAWN AEROBIC AND ANAEROBIC Blood Culture results may not be optimal due to an inadequate volume of blood received in culture bottles Performed at [facility], [address] [place], [place]  [address]    Culture   Final    NO GROWTH 3  DAYS Performed at [facility], [address] [place], [place] [address]    Report Status PENDING  Incomplete  Urine Culture     Status: Abnormal   Collection Time: 02/18/24  8:17 PM   Specimen: Urine, Random  Result Value Ref Range Status   Specimen Description   Final    URINE, RANDOM Performed at [facility], [address], [place], [place] [address]    Special Requests   Final    NONE Reflexed from [id number] Performed at [facility], [address], [place], [place] [address]    Culture >=100,000 COLONIES/mL KLEBSIELLA PNEUMONIAE (A)  Final   Report Status 02/21/2024 FINAL  Final   Organism ID, Bacteria KLEBSIELLA PNEUMONIAE (A)  Final      Susceptibility   Klebsiella pneumoniae - MIC*    AMPICILLIN RESISTANT Resistant     CEFAZOLIN  <=4 SENSITIVE Sensitive     CEFEPIME <=0.12 SENSITIVE Sensitive     CEFTRIAXONE  <=0.25 SENSITIVE Sensitive     CIPROFLOXACIN  <=0.25 SENSITIVE Sensitive     GENTAMICIN <=1 SENSITIVE Sensitive     IMIPENEM 1 SENSITIVE Sensitive     NITROFURANTOIN 128 RESISTANT Resistant     TRIMETH/SULFA <=20 SENSITIVE Sensitive     AMPICILLIN/SULBACTAM 4 SENSITIVE Sensitive     PIP/TAZO <=4 SENSITIVE Sensitive ug/mL    * >=100,000 COLONIES/mL KLEBSIELLA PNEUMONIAE    Labs: CBC: Recent Labs  Lab 02/18/24 1811 02/18/24 1826 02/18/24 2139 02/19/24 0551 02/20/24 0604 02/21/24 0520  WBC 8.7  --  9.6 6.5 5.2 6.7  NEUTROABS 5.6  --   --   --   --   --   HGB 11.2* 10.9* 10.2* 10.8* 10.0* 9.8*  HCT 34.7* 32.0* 32.6* 33.6* 31.9* 31.8*  MCV 84.0  --  83.8 84.0 86.0 87.1  PLT 271  --  250 230 200 219   Basic Metabolic Panel: Recent Labs  Lab 02/18/24 1811 02/18/24 1826 02/18/24 2139 02/19/24 0551 02/20/24 0604 02/21/24 0520  NA 134* 137  --  136 138 137  K 2.7* 2.8*  --  3.2* 3.5 3.2*  CL 101 103  --  108 109 109  CO2 24  --   --  20* 20* 20*  GLUCOSE 91 81  --   97 83 76  BUN 37* 36*  --  32* 22 18  CREATININE 2.84* 2.90* 2.69* 2.53* 1.83* 1.57*  CALCIUM  8.5*  --   --  8.6* 8.6* 8.3*  MG 2.1  --   --   --   --   --    Liver Function Tests: Recent Labs  Lab 02/18/24 1811 02/19/24 0551 02/20/24 0604 02/21/24 0520  AST 30 30 27 26   ALT 26 27 25 27   ALKPHOS 122 110 103 98  BILITOT 0.8 0.6 0.4 0.3  PROT 6.8 6.0* 5.7* 5.6*  ALBUMIN 3.0* 2.7* 2.5* 2.4*   CBG: Recent Labs  Lab 02/20/24 0736 02/20/24 1115 02/20/24 1631 02/20/24 2058 02/21/24 0714  GLUCAP 103* 113* 110* 99 93    Discharge time spent: greater than 30 minutes.  Signed: [name], MD [facility] 02/21/2024

## 2024-02-21 NOTE — TOC Transition Note (Signed)
 Transition of Care [facility]) - Discharge Note   Patient Details  Name: Brooke [name] MRN: [id number] Date of Birth: 06-10-1951  Transition of Care [facility]) CM/SW Contact:  Sonda Manuella Quill, RN Phone Number: 02/21/2024, 11:30 AM   Clinical Narrative:    D/C and HHPT/OT orders received; spoke w/ pt in room; she politely declined HH services; no TOC needs.   Final next level of care: Home/Self Care Barriers to Discharge: No Barriers Identified   Patient Goals and CMS Choice Patient states their goals for this hospitalization and ongoing recovery are:: home CMS Medicare.gov Compare Post Acute Care list provided to:: Patient   [place] ownership interest in [facility].provided to:: Patient    Discharge Placement                       Discharge Plan and Services Additional resources added to the After Visit Summary for     Discharge Planning Services: CM Consult                                 Social Drivers of Health (SDOH) Interventions SDOH Screenings   Food Insecurity: No Food Insecurity (02/20/2024)  Housing: Low Risk  (02/20/2024)  Transportation Needs: No Transportation Needs (02/20/2024)  Utilities: Not At Risk (02/20/2024)  Alcohol Screen: Low Risk  (09/24/2023)  Depression (PHQ2-9): Low Risk  (10/02/2023)  Financial Resource Strain: Low Risk  (09/24/2023)  Physical Activity: Insufficiently Active (09/24/2023)  Social Connections: Moderately Isolated (02/19/2024)  Stress: No Stress Concern Present (09/24/2023)  Tobacco Use: Low Risk  (02/18/2024)  Health Literacy: Adequate Health Literacy (09/24/2023)     Readmission Risk Interventions     No data to display

## 2024-02-22 NOTE — Progress Notes (Signed)
 Received a call from microbiology lab that discharged patient had 1 out of 2 sets of blood culture positive for GPC - staph epi, and GPR in the aerobic bottle only.  Suspected contaminant.  No change in treatment.   Dorn Dawson, MD  [facility]

## 2024-02-23 LAB — CULTURE, BLOOD (ROUTINE X 2): Culture: NO GROWTH

## 2024-02-24 ENCOUNTER — Telehealth: Payer: Self-pay | Admitting: *Deleted

## 2024-02-24 NOTE — Transitions of Care (Post Inpatient/ED Visit) (Signed)
 02/24/2024  Name: Brooke [name] MRN: [id number] DOB: [birth year]  Today's TOC FU Call Status: Today's TOC FU Call Status:: Successful TOC FU Call Completed TOC FU Call Complete Date: 02/24/24 Patient's Name and Date of Birth confirmed.  Transition Care Management Follow-up Telephone Call Date of Discharge: 02/21/24 Discharge Facility: [facility]) Type of Discharge: Inpatient Admission Primary Inpatient Discharge Diagnosis:: Acute Renal Failure How have you been since you were released from the hospital?: Better (I am doing fine, I was only in the hospital for 2 nights and am pretty much back to my normal self now.  I am independent and have all the help I need here at my home.  I live with my son) Any questions or concerns?: No  Items Reviewed: Did you receive and understand the discharge instructions provided?: Yes (thoroughly reviewed with patient who verbalizes good understanding of same) Medications obtained,verified, and reconciled?: Yes (Medications Reviewed) (Full medication reconciliation/ review completed; no concerns or discrepancies identified; confirmed patient obtained/ is taking all newly Rx'd medications as instructed; self-manages medications and denies questions/ concerns around medications today) Any new allergies since your discharge?: No Dietary orders reviewed?: Yes Type of Diet Ordered:: Healthy- I watch my salt to make sure I don't eat too much salt Do you have support at home?: Yes People in Home [RPT]: child(ren), adult Name of Support/Comfort Primary Source: Reports independent in self-care activities; Resides with supportive son- assists as/ if needed/ indicated  Medications Reviewed Today: Medications Reviewed Today     Reviewed by Makani Seckman M, RN (Registered Nurse) on 02/24/24 at [phone]  Med List Status: <None>   Medication Order Taking? Sig Documenting Provider Last Dose Status Informant  acetaminophen  (TYLENOL ) 325 MG tablet [id number] Yes  Take 650 mg by mouth every 6 (six) hours as needed for mild pain. Norleen Lynwood ORN, MD  Active Self, Pharmacy Records  acetaZOLAMIDE (DIAMOX) 250 MG tablet [id number] Yes Take by mouth. [provider]  Active Self, Pharmacy Records  albuterol  (VENTOLIN  HFA) 108 (90 Base) MCG/ACT inhaler [id number] Yes Inhale 2 puffs into the lungs every 4 (four) hours as needed for wheezing or shortness of breath. Jeneal Danita Macintosh, MD  Active Self, Pharmacy Records  allopurinol  (ZYLOPRIM ) 100 MG tablet [id number] Yes TAKE 1 TABLET BY MOUTH EVERY DAY Norleen Lynwood ORN, MD  Active Self, Pharmacy Records  atropine 1 % ophthalmic solution [id number] Yes Place 1 drop into the left eye daily. [provider]  Active Self, Pharmacy Records  azelastine  (ASTELIN ) 0.1 % nasal spray [id number] Yes Place 2 sprays into both nostrils 2 (two) times daily. Use in each nostril as directed Norleen Lynwood ORN, MD  Active Self, Pharmacy Records  brimonidine  (ALPHAGAN ) 0.2 % ophthalmic solution [id number] Yes 3 (three) times daily. 1 drop in the left eye three times daily  Patient taking differently: Place 1 drop into the left eye 3 (three) times daily. 1 drop in the left eye three times daily   02/24/24: Reports during TOC call, taking BID, (NOT TID)   [provider]  Active Self, Pharmacy Records  cephALEXin  (KEFLEX ) 500 MG capsule [id number] Yes Take 1 capsule (500 mg total) by mouth 2 (two) times daily for 5 days. Drusilla Sabas RAMAN, MD  Active   cetirizine  (ZYRTEC ) 10 MG tablet [id number] Yes Take 1 tablet (10 mg total) by mouth daily. Jeneal Danita Macintosh, MD  Active Self, Pharmacy Records  citalopram  (CELEXA ) 40 MG tablet [id number] Yes TAKE 1 TABLET BY MOUTH EVERY DAY Norleen,  Lynwood ORN, MD  Active Self, Pharmacy Records  clonazePAM  (KLONOPIN ) 0.5 MG tablet [id number] Yes Take 1 tablet (0.5 mg total) by mouth daily as needed (vertigo). Norleen Lynwood ORN, MD  Active Self, Pharmacy Records  colchicine  0.6 MG tablet [id number]  Yes TAKE 0.5 TABLETS (0.3 MG TOTAL) BY MOUTH DAILY AS NEEDED (GOUT OR PSUEDOGOUT PAIN). Corey, Evan S, MD  Active Self, Pharmacy Records  diltiazem  (CARDIZEM  CD) 240 MG 24 hr capsule [id number] Yes Take 1 capsule (240 mg total) by mouth daily. Norleen Lynwood ORN, MD  Active Self, Pharmacy Records  dorzolamide  (TRUSOPT ) 2 % ophthalmic solution [id number]  Place 1 drop into the left eye 3 (three) times daily.  Patient not taking: Reported on 02/24/2024   [provider]  Active Self, Pharmacy Records  famotidine  (PEPCID ) 20 MG tablet [id number] Yes Take 1 tablet (20 mg total) by mouth 2 (two) times daily. Jeneal Danita Macintosh, MD  Active Self, Pharmacy Records  fexofenadine  (ALLEGRA ) 180 MG tablet [id number]  Take 1 tablet (180 mg total) by mouth daily.  Patient not taking: Reported on 02/24/2024   Norleen Lynwood ORN, MD  Active Self, Pharmacy Records  Fluticasone -Umeclidin-Vilant (TRELEGY ELLIPTA ) 200-62.5-25 MCG/ACT AEPB [id number] Yes Inhale 1 puff into the lungs daily. Jeneal Danita Macintosh, MD  Active Self, Pharmacy Records  JARDIANCE  25 MG TABS tablet [id number] Yes TAKE 1 TABLET BY MOUTH EVERY DAY BEFORE BREAKFAST Norleen Lynwood ORN, MD  Active Self, Pharmacy Records  latanoprost  (XALATAN ) 0.005 % ophthalmic solution [id number] Yes Place 1 drop into the left eye at bedtime. [provider]  Active Self, Pharmacy Records  meclizine  (ANTIVERT ) 25 MG tablet [id number] Yes TAKE 1 TABLET BY MOUTH 3 TIMES A DAY AS NEEDED FOR DIZZINESS John, James W, MD  Active Self, Pharmacy Records  metoprolol  succinate (TOPROL -XL) 100 MG 24 hr tablet [id number] Yes Take 1 tablet (100 mg total) by mouth daily. TAKE 1 TABLET BY MOUTH DAILY. TAKE WITH OR IMMEDIATELY FOLLOWING A MEAL. Drusilla Sabas RAMAN, MD  Active   omeprazole  (PRILOSEC) 40 MG capsule [id number] Yes Take 1 capsule (40 mg total) by mouth daily. Norleen Lynwood ORN, MD  Active Self, Pharmacy Records  rosuvastatin  (CRESTOR ) 40 MG tablet [id number] Yes TAKE 1 TABLET BY  MOUTH EVERY DAY Shlomo Wilbert SAUNDERS, MD  Active Self, Pharmacy Records  tirzepatide  (MOUNJARO ) 2.5 MG/0.5ML Pen [id number] Yes Inject 2.5 mg into the skin once a week. Norleen Lynwood ORN, MD  Active Self, Pharmacy Records  traZODone  (DESYREL ) 100 MG tablet [id number] Yes Take 1 tablet (100 mg total) by mouth at bedtime as needed. for sleep Norleen Lynwood ORN, MD  Active Self, Pharmacy Records           Home Care and Equipment/Supplies: Were Home Health Services Ordered?: No (Patient declined home health services at time of hospital discharge) Any new equipment or medical supplies ordered?: No  Functional Questionnaire: Do you need assistance with bathing/showering or dressing?: No Do you need assistance with meal preparation?: No Do you need assistance with eating?: No Do you have difficulty maintaining continence: No Do you need assistance with getting out of bed/getting out of a chair/moving?: No Do you have difficulty managing or taking your medications?: No  Follow up appointments reviewed: PCP Follow-up appointment confirmed?: Yes Date of PCP follow-up appointment?: 02/26/24 Follow-up Provider: PCP- Dr. Norleen Driscilla Salvage Follow-up appointment confirmed?: Yes Date of Specialist follow-up appointment?: 03/05/24 Follow-Up Specialty Provider:: Established Asthma/ Allergy provider Do you need transportation to your follow-up appointment?: No Do  you understand care options if your condition(s) worsen?: Yes-patient verbalized understanding  SDOH Interventions Today    Flowsheet Row Most Recent Value  SDOH Interventions   Food Insecurity Interventions Intervention Not Indicated  Housing Interventions Intervention Not Indicated  Transportation Interventions Intervention Not Indicated  [Reports has used [facility] services for years]  Utilities Interventions Intervention Not Indicated   See TOC assessment tabs for additional assessment/ TOC intervention information  Patient declines need for  ongoing/ further care management/ coordination outreach; declines enrollment in 30-day TOC program- declines taking my direct phone number should needs/ concerns arise post-TOC call   Pls call/ message for questions,  Irmgard Rampersaud Mckinney Eusevio Schriver, RN, BSN, Media planner  Transitions of Care  [facility] [phone]: direct office

## 2024-02-26 ENCOUNTER — Ambulatory Visit: Admitting: Internal Medicine

## 2024-02-26 ENCOUNTER — Ambulatory Visit: Payer: Self-pay | Admitting: Internal Medicine

## 2024-02-26 ENCOUNTER — Encounter: Payer: Self-pay | Admitting: Internal Medicine

## 2024-02-26 VITALS — BP 138/64 | HR 66 | Temp 98.1°F | Ht 63.0 in | Wt 191.2 lb

## 2024-02-26 DIAGNOSIS — N179 Acute kidney failure, unspecified: Secondary | ICD-10-CM

## 2024-02-26 DIAGNOSIS — Z87440 Personal history of urinary (tract) infections: Secondary | ICD-10-CM

## 2024-02-26 DIAGNOSIS — N3000 Acute cystitis without hematuria: Secondary | ICD-10-CM

## 2024-02-26 DIAGNOSIS — E1165 Type 2 diabetes mellitus with hyperglycemia: Secondary | ICD-10-CM | POA: Diagnosis not present

## 2024-02-26 DIAGNOSIS — K869 Disease of pancreas, unspecified: Secondary | ICD-10-CM

## 2024-02-26 DIAGNOSIS — I10 Essential (primary) hypertension: Secondary | ICD-10-CM | POA: Diagnosis not present

## 2024-02-26 DIAGNOSIS — J189 Pneumonia, unspecified organism: Secondary | ICD-10-CM

## 2024-02-26 DIAGNOSIS — H6122 Impacted cerumen, left ear: Secondary | ICD-10-CM

## 2024-02-26 LAB — CBC WITH DIFFERENTIAL/PLATELET
Basophils Absolute: 0.1 K/uL (ref 0.0–0.1)
Basophils Relative: 0.6 % (ref 0.0–3.0)
Eosinophils Absolute: 0.1 K/uL (ref 0.0–0.7)
Eosinophils Relative: 0.8 % (ref 0.0–5.0)
HCT: 33.6 % — ABNORMAL LOW (ref 36.0–46.0)
Hemoglobin: 10.8 g/dL — ABNORMAL LOW (ref 12.0–15.0)
Lymphocytes Relative: 15.9 % (ref 12.0–46.0)
Lymphs Abs: 1.3 K/uL (ref 0.7–4.0)
MCHC: 32.1 g/dL (ref 30.0–36.0)
MCV: 82.9 fl (ref 78.0–100.0)
Monocytes Absolute: 0.8 K/uL (ref 0.1–1.0)
Monocytes Relative: 9.6 % (ref 3.0–12.0)
Neutro Abs: 6.1 K/uL (ref 1.4–7.7)
Neutrophils Relative %: 73.1 % (ref 43.0–77.0)
Platelets: 248 K/uL (ref 150.0–400.0)
RBC: 4.06 Mil/uL (ref 3.87–5.11)
RDW: 16.8 % — ABNORMAL HIGH (ref 11.5–15.5)
WBC: 8.3 K/uL (ref 4.0–10.5)

## 2024-02-26 LAB — HEPATIC FUNCTION PANEL
ALT: 35 U/L (ref 0–35)
AST: 29 U/L (ref 0–37)
Albumin: 3.5 g/dL (ref 3.5–5.2)
Alkaline Phosphatase: 100 U/L (ref 39–117)
Bilirubin, Direct: 0 mg/dL (ref 0.0–0.3)
Total Bilirubin: 0.2 mg/dL (ref 0.2–1.2)
Total Protein: 6.7 g/dL (ref 6.0–8.3)

## 2024-02-26 LAB — BASIC METABOLIC PANEL WITH GFR
BUN: 15 mg/dL (ref 6–23)
CO2: 24 meq/L (ref 19–32)
Calcium: 9.2 mg/dL (ref 8.4–10.5)
Chloride: 108 meq/L (ref 96–112)
Creatinine, Ser: 1.48 mg/dL — ABNORMAL HIGH (ref 0.40–1.20)
GFR: 35.25 mL/min — ABNORMAL LOW (ref >60.00)
Glucose, Bld: 95 mg/dL (ref 70–99)
Potassium: 3.4 meq/L — ABNORMAL LOW (ref 3.5–5.1)
Sodium: 139 meq/L (ref 135–145)

## 2024-02-26 LAB — CULTURE, BLOOD (ROUTINE X 2)

## 2024-02-26 LAB — HEMOGLOBIN A1C: Hgb A1c MFr Bld: 6.2 % (ref 4.6–6.5)

## 2024-02-26 NOTE — Progress Notes (Signed)
 The test results show that your current treatment is OK, as the tests are stable.  Please continue the same plan.  There is no other need for change of treatment or further evaluation based on these results, at this time.  thanks

## 2024-02-26 NOTE — Patient Instructions (Signed)
 Your Left ear was cleared of wax today  Please finish the cephalexin  as you have  Please continue all other medications as before, including the toprol  xl qt 100 mg per day only, and no telmisartan  or HCT for now  Please have the pharmacy call with any other refills you may need.  Please continue your efforts at being more active, low cholesterol diet, and weight control.  Please keep your appointments with your specialists as you may have planned  Please go to the LAB at the blood drawing area for the tests to be done  You will be contacted by phone if any changes need to be made immediately.  Otherwise, you will receive a letter about your results with an explanation, but please check with MyChart first.  Please make an Appointment to return in 1 months, or sooner if needed

## 2024-02-26 NOTE — Progress Notes (Unsigned)
 Patient ID: Brooke [name], female   DOB: Feb 04, 1951, 73 y.o.   MRN: [id number]        Chief Complaint: follow up s/p hospn July 29 - aug 1 with AKI in CKD3 with telmisartan  and hct stopped, UtI, CAP on imaging, and toprol  xl reduced to 100 mg qd      HPI:  Brooke [name] is a 73 y.o. female here with above, overall doing well  Denies fever, chills, worsening cough or dysuria.  Pt denies chest pain, increased sob or doe, wheezing, orthopnea, PND, increased LE swelling, palpitations, dizziness or syncope.   Pt denies polydipsia, polyuria, or new focal neuro s/s.    Has appt Dr Avram Oct 31 for pancreatic lesion.  Does also have left ear canal wax impaction with reduced hearing, hoping for clearing today Wt Readings from Last 3 Encounters:  02/26/24 191 lb 3.2 oz (86.7 kg)  02/18/24 183 lb (83 kg)  11/27/23 194 lb 8 oz (88.2 kg)   BP Readings from Last 3 Encounters:  02/26/24 138/64  02/21/24 (!) 170/88  11/27/23 138/68         Past Medical History:  Diagnosis Date   Alopecia    Anxiety    Aortic atherosclerosis (HCC)    Asthma    FOLLOWED BY PCP   Eczema    GERD (gastroesophageal reflux disease)    Gout    04-28-2018--- per pt stable , as been a while since last episode   Heart murmur    History of colon polyps    History of syncope 2015   Hyperlipidemia    Hypertension    Hypothyroidism    OA (osteoarthritis) of knee    bilateral   PVC's (premature ventricular contractions)    Toxic multinodular goiter    03/ 2003  s/p RAI   Type 2 diabetes mellitus (HCC)    followed by pcp   Ventricular tachycardia, polymorphic (HCC) 01/04/2014   primary cardiologist-- dr wilbert turner (hx monitor 2015 showed couplet PVCs, as trigger)   Wears glasses    Wears partial dentures    upper   Past Surgical History:  Procedure Laterality Date   ABDOMINAL HYSTERECTOMY  10/22/1999   WITH BSO   CATARACT EXTRACTION W/ INTRAOCULAR LENS IMPLANT Left YRS AGO   COLONOSCOPY     EXCISION ABDOMINAL  WALL MASS  12-20-2005   dr merrilyn @[facility]    neruofibroma   LAPAROSCOPIC CHOLECYSTECTOMY  10/01/2002   dr merrilyn @[facility]    LEFT HEART CATHETERIZATION WITH CORONARY ANGIOGRAM N/A 01/07/2014   Procedure: LEFT HEART CATHETERIZATION WITH CORONARY ANGIOGRAM;  Surgeon: Peter M Swaziland, MD;  Location: [facility] CATH LAB;  Service: Cardiovascular;  Laterality: N/A;   RASTELLI PROCEDURE  6/98 neg   RENAL ARTERY STENT Left 11/2003   angioplasty and stenting   RENAL ARTERY STENT Left 02/2005   re-stenting   TOTAL KNEE ARTHROPLASTY Right 05/05/2018   Procedure: RIGHT TOTAL KNEE ARTHROPLASTY;  Surgeon: Liam Lerner, MD;  Location: [facility] ORS;  Service: Orthopedics;  Laterality: Right;   TOTAL KNEE ARTHROPLASTY Left 09/30/2018   Procedure: TOTAL KNEE ARTHROPLASTY;  Surgeon: Sheril Coy, MD;  Location: [facility] OR[facility];  Service: Orthopedics;  Laterality: Left;    reports that she has never smoked. She has never been exposed to tobacco smoke. She has never used smokeless tobacco. She reports that she does not drink alcohol and does not use drugs. family history includes Diabetes in her mother; Heart disease in her father. Allergies  Allergen Reactions  Metformin  And Related Nausea And Vomiting   Ace Inhibitors Swelling        Codeine Nausea And Vomiting and Rash   Hydrocodone  Nausea And Vomiting   Tramadol  Nausea And Vomiting   Tylenol  [Acetaminophen ] Itching and Rash    Allergic to prescription strength tylenol     Current Outpatient Medications on File Prior to Visit  Medication Sig Dispense Refill   acetaminophen  (TYLENOL ) 325 MG tablet Take 650 mg by mouth every 6 (six) hours as needed for mild pain.     acetaZOLAMIDE (DIAMOX) 250 MG tablet Take by mouth.     albuterol  (VENTOLIN  HFA) 108 (90 Base) MCG/ACT inhaler Inhale 2 puffs into the lungs every 4 (four) hours as needed for wheezing or shortness of breath. 18 g 1   allopurinol  (ZYLOPRIM ) 100 MG tablet TAKE 1 TABLET BY MOUTH EVERY DAY 90 tablet 3   atropine 1 %  ophthalmic solution Place 1 drop into the left eye daily.     azelastine  (ASTELIN ) 0.1 % nasal spray Place 2 sprays into both nostrils 2 (two) times daily. Use in each nostril as directed 30 mL 12   brimonidine  (ALPHAGAN ) 0.2 % ophthalmic solution 3 (three) times daily. 1 drop in the left eye three times daily (Patient taking differently: Place 1 drop into the left eye 3 (three) times daily. 1 drop in the left eye three times daily   02/24/24: Reports during TOC call, taking BID, (NOT TID))     cetirizine  (ZYRTEC ) 10 MG tablet Take 1 tablet (10 mg total) by mouth daily. 30 tablet 5   citalopram  (CELEXA ) 40 MG tablet TAKE 1 TABLET BY MOUTH EVERY DAY 90 tablet 3   clonazePAM  (KLONOPIN ) 0.5 MG tablet Take 1 tablet (0.5 mg total) by mouth daily as needed (vertigo). 90 tablet 1   colchicine  0.6 MG tablet TAKE 0.5 TABLETS (0.3 MG TOTAL) BY MOUTH DAILY AS NEEDED (GOUT OR PSUEDOGOUT PAIN). 45 tablet 1   diltiazem  (CARDIZEM  CD) 240 MG 24 hr capsule Take 1 capsule (240 mg total) by mouth daily. 90 capsule 3   famotidine  (PEPCID ) 20 MG tablet Take 1 tablet (20 mg total) by mouth 2 (two) times daily. 60 tablet 5   fexofenadine  (ALLEGRA ) 180 MG tablet Take 1 tablet (180 mg total) by mouth daily. 90 tablet 3   Fluticasone -Umeclidin-Vilant (TRELEGY ELLIPTA ) 200-62.5-25 MCG/ACT AEPB Inhale 1 puff into the lungs daily. 28 each 5   JARDIANCE  25 MG TABS tablet TAKE 1 TABLET BY MOUTH EVERY DAY BEFORE BREAKFAST 90 tablet 3   latanoprost  (XALATAN ) 0.005 % ophthalmic solution Place 1 drop into the left eye at bedtime.     meclizine  (ANTIVERT ) 25 MG tablet TAKE 1 TABLET BY MOUTH 3 TIMES A DAY AS NEEDED FOR DIZZINESS 90 tablet 1   metoprolol  succinate (TOPROL -XL) 100 MG 24 hr tablet Take 1 tablet (100 mg total) by mouth daily. TAKE 1 TABLET BY MOUTH DAILY. TAKE WITH OR IMMEDIATELY FOLLOWING A MEAL. 30 tablet 2   omeprazole  (PRILOSEC) 40 MG capsule Take 1 capsule (40 mg total) by mouth daily. 90 capsule 3   rosuvastatin   (CRESTOR ) 40 MG tablet TAKE 1 TABLET BY MOUTH EVERY DAY 90 tablet 3   tirzepatide  (MOUNJARO ) 2.5 MG/0.5ML Pen Inject 2.5 mg into the skin once a week. 2 mL 11   traZODone  (DESYREL ) 100 MG tablet Take 1 tablet (100 mg total) by mouth at bedtime as needed. for sleep 90 tablet 1   dorzolamide  (TRUSOPT ) 2 % ophthalmic solution Place  1 drop into the left eye 3 (three) times daily. (Patient not taking: Reported on 02/26/2024)     No current facility-administered medications on file prior to visit.        ROS:  All others reviewed and negative.  Objective        PE:  BP 138/64   Pulse 66   Temp 98.1 F (36.7 C)   Ht 5' 3 (1.6 m)   Wt 191 lb 3.2 oz (86.7 kg)   SpO2 97%   BMI 33.87 kg/m                 Constitutional: Pt appears in NAD               HENT: Head: NCAT.                Right Ear: External ear normal.                 Left Ear: External ear normal. Left canal with wax impaction cleared with improved hearing               Eyes: . Pupils are equal, round, and reactive to light. Conjunctivae and EOM are normal               Nose: without d/c or deformity               Neck: Neck supple. Gross normal ROM               Cardiovascular: Normal rate and regular rhythm.                 Pulmonary/Chest: Effort normal and breath sounds without rales or wheezing.                Abd:  Soft, NT, ND, + BS, no organomegaly               Neurological: Pt is alert. At baseline orientation, motor grossly intact               Skin: Skin is warm. No rashes, no other new lesions, LE edema - none               Psychiatric: Pt behavior is normal without agitation   Micro: none  Cardiac tracings I have personally interpreted today:  none  Pertinent Radiological findings (summarize): none   Lab Results  Component Value Date   WBC 8.3 02/26/2024   HGB 10.8 (L) 02/26/2024   HCT 33.6 (L) 02/26/2024   PLT 248.0 02/26/2024   GLUCOSE 95 02/26/2024   CHOL 128 10/02/2023   TRIG 91.0 10/02/2023   HDL  49.40 10/02/2023   LDLDIRECT 139.1 09/05/2009   LDLCALC 61 10/02/2023   ALT 35 02/26/2024   AST 29 02/26/2024   NA 139 02/26/2024   K 3.4 (L) 02/26/2024   CL 108 02/26/2024   CREATININE 1.48 (H) 02/26/2024   BUN 15 02/26/2024   CO2 24 02/26/2024   TSH 1.044 02/18/2024   INR 1.0 09/24/2018   HGBA1C 6.2 02/26/2024   MICROALBUR 1.1 10/02/2023   Assessment/Plan:  Anira Senegal Bruster is a 73 y.o. Black or African American [2] female with  has a past medical history of Alopecia, Anxiety, Aortic atherosclerosis (HCC), Asthma, Eczema, GERD (gastroesophageal reflux disease), Gout, Heart murmur, History of colon polyps, History of syncope (2015), Hyperlipidemia, Hypertension, Hypothyroidism, OA (osteoarthritis) of knee, PVC's (premature ventricular contractions), Toxic multinodular goiter, Type 2 diabetes mellitus (HCC),  Ventricular tachycardia, polymorphic (HCC) (01/04/2014), Wears glasses, and Wears partial dentures.  Pneumonia Noted on imaging, did have rocephin  course while hospd, now asymptomatic, will cont to follow for now  UTI (urinary tract infection) Clinically resolved, no further testing or antibx today  AKI (acute kidney injury) (HCC) Improved by time of discharge, for f/u lab today  Pancreatic lesion Incidental noted on imaging - has f/u appt GI oct 31  Essential hypertension BP Readings from Last 3 Encounters:  02/26/24 138/64  02/21/24 (!) 170/88  11/27/23 138/68   Stable, pt to continue medical treatment but continue to hold telmisartan  and HCT for now   Cerumen impaction Cleared today, hearing improved,  to f/u any worsening symptoms or concerns  Followup: Return in about 4 weeks (around 03/25/2024).  Lynwood Rush, MD 02/27/2024 12:17 PM [facility]

## 2024-02-27 ENCOUNTER — Encounter: Payer: Self-pay | Admitting: Internal Medicine

## 2024-02-27 DIAGNOSIS — H6120 Impacted cerumen, unspecified ear: Secondary | ICD-10-CM | POA: Insufficient documentation

## 2024-02-27 DIAGNOSIS — J189 Pneumonia, unspecified organism: Secondary | ICD-10-CM | POA: Insufficient documentation

## 2024-02-27 DIAGNOSIS — N390 Urinary tract infection, site not specified: Secondary | ICD-10-CM | POA: Insufficient documentation

## 2024-02-27 DIAGNOSIS — K869 Disease of pancreas, unspecified: Secondary | ICD-10-CM | POA: Insufficient documentation

## 2024-02-27 NOTE — Assessment & Plan Note (Signed)
 Noted on imaging, did have rocephin  course while hospd, now asymptomatic, will cont to follow for now

## 2024-02-27 NOTE — Assessment & Plan Note (Signed)
 Cleared today, hearing improved,  to f/u any worsening symptoms or concerns

## 2024-02-27 NOTE — Assessment & Plan Note (Signed)
 Clinically resolved, no further testing or antibx today

## 2024-02-27 NOTE — Assessment & Plan Note (Signed)
 Improved by time of discharge, for f/u lab today

## 2024-02-27 NOTE — Assessment & Plan Note (Signed)
 Incidental noted on imaging - has f/u appt GI oct 31

## 2024-02-27 NOTE — Assessment & Plan Note (Signed)
 BP Readings from Last 3 Encounters:  02/26/24 138/64  02/21/24 (!) 170/88  11/27/23 138/68   Stable, pt to continue medical treatment but continue to hold telmisartan  and HCT for now

## 2024-02-28 ENCOUNTER — Other Ambulatory Visit: Payer: Self-pay

## 2024-03-02 ENCOUNTER — Other Ambulatory Visit: Payer: Self-pay | Admitting: Internal Medicine

## 2024-03-05 ENCOUNTER — Ambulatory Visit: Admitting: Allergy

## 2024-03-05 ENCOUNTER — Other Ambulatory Visit: Payer: Self-pay

## 2024-03-05 ENCOUNTER — Encounter: Payer: Self-pay | Admitting: Allergy

## 2024-03-05 VITALS — BP 132/84 | HR 70 | Temp 97.6°F | Resp 14 | Ht 63.1 in | Wt 186.9 lb

## 2024-03-05 DIAGNOSIS — J4550 Severe persistent asthma, uncomplicated: Secondary | ICD-10-CM | POA: Diagnosis not present

## 2024-03-05 DIAGNOSIS — L509 Urticaria, unspecified: Secondary | ICD-10-CM | POA: Diagnosis not present

## 2024-03-05 DIAGNOSIS — L299 Pruritus, unspecified: Secondary | ICD-10-CM

## 2024-03-05 DIAGNOSIS — J310 Chronic rhinitis: Secondary | ICD-10-CM

## 2024-03-05 MED ORDER — LEVOCETIRIZINE DIHYDROCHLORIDE 5 MG PO TABS: 5.0000 mg | ORAL_TABLET | Freq: Every day | ORAL | 5 refills | Status: DC

## 2024-03-05 MED ORDER — ALBUTEROL SULFATE HFA 108 (90 BASE) MCG/ACT IN AERS: 2.0000 | INHALATION_SPRAY | RESPIRATORY_TRACT | 1 refills | Status: AC | PRN

## 2024-03-05 MED ORDER — TRELEGY ELLIPTA 200-62.5-25 MCG/ACT IN AEPB: 1.0000 | INHALATION_SPRAY | Freq: Every day | RESPIRATORY_TRACT | 5 refills | Status: AC

## 2024-03-05 MED ORDER — FAMOTIDINE 20 MG PO TABS: 20.0000 mg | ORAL_TABLET | Freq: Two times a day (BID) | ORAL | 5 refills | Status: AC

## 2024-03-05 NOTE — Patient Instructions (Addendum)
 Itching, Rash  - Itching/rash can be caused by a variety of different triggers including illness/infection, foods, medications, stings, exercise, pressure, vibrations, extremes of temperature to name a few however majority of the time there is no identifiable trigger.   Labwork from your PCP and new labs from May also normal and reassuring.   - for itch/rash control recommend use of Xyzal  5mg  1 tab twice a day with Pepcid  20mg  1 tab twice a day.   - if above regimen is not effective enough in controlling symptoms then would recommend considering Xolair monthly injections for control of itching/rash.   - Xyzal  is replacing your Cetirizine   Asthma - Daily controller medication(s): Trelegy 200/62.5/25mcg one puff once daily - Prior to physical activity if needed: albuterol  2 puffs 10-15 minutes before physical activity. - Rescue medications: albuterol  2 puffs every 4-6 hours as needed  - Asthma control goals:  * Full participation in all desired activities (may need albuterol  before activity) * Albuterol  use two time or less a week on average (not counting use with activity) * Cough interfering with sleep two time or less a month * Oral steroids no more than once a year * No hospitalizations  Allergies - Use Xyzal  as above - Environmental allergy panel was negative.  Consider skin testing in future if symptoms worsen/change and medications are not effective enough  Follow-up in 4-6 months or sooner if needed

## 2024-03-05 NOTE — Progress Notes (Signed)
 Follow-up Note  RE: Brooke [name] MRN: [id number] DOB: May 02, 1951 Date of Office Visit: 03/05/2024   History of present illness: Brooke [name] is a 73 y.o. female presenting today for follow-up of pruritic dermatitis, asthma and rhinitis.  She was last seen in the office on 11/27/2023 by myself. Discussed the use of AI scribe software for clinical note transcription with the patient, who gave verbal consent to proceed.  She was hospitalized for three days in May due to dehydration, pneumonia, and kidney issues. During her stay, she received antibiotics and IV fluids. Since discharge, she has maintained adequate hydration and her breathing has been stable, with wheezing occurring about once a month, managed with albuterol . She has not required albuterol  for other reasons.  Her asthma management includes Trelegy, taken as one puff once daily. This regimen was previously increased, and she reports it has positively impacted her condition. Recent spirometry showed FEV1 at 112%, compared to 96% previously.  She confirms taking famotidine  but is unsure about cetirizine . Her itching and rash symptoms remain unchanged. .      Review of systems: 10pt ROS negative unless noted above in HPI  Past medical/social/surgical/family history have been reviewed and are unchanged unless specifically indicated below.  No changes  Medication List: Current Outpatient Medications  Medication Sig Dispense Refill   acetaminophen  (TYLENOL ) 325 MG tablet Take 650 mg by mouth every 6 (six) hours as needed for mild pain.     acetaZOLAMIDE (DIAMOX) 250 MG tablet Take by mouth.     albuterol  (VENTOLIN  HFA) 108 (90 Base) MCG/ACT inhaler Inhale 2 puffs into the lungs every 4 (four) hours as needed for wheezing or shortness of breath. 18 g 1   atropine 1 % ophthalmic solution Place 1 drop into the left eye daily.     azelastine  (ASTELIN ) 0.1 % nasal spray Place 2 sprays into both nostrils 2 (two) times daily. Use in  each nostril as directed 30 mL 12   brimonidine  (ALPHAGAN ) 0.2 % ophthalmic solution 3 (three) times daily. 1 drop in the left eye three times daily (Patient taking differently: Place 1 drop into the left eye 3 (three) times daily. 1 drop in the left eye three times daily   02/24/24: Reports during TOC call, taking BID, (NOT TID))     cetirizine  (ZYRTEC ) 10 MG tablet Take 1 tablet (10 mg total) by mouth daily. 30 tablet 5   citalopram  (CELEXA ) 40 MG tablet TAKE 1 TABLET BY MOUTH EVERY DAY 90 tablet 3   clonazePAM  (KLONOPIN ) 0.5 MG tablet Take 1 tablet (0.5 mg total) by mouth daily as needed (vertigo). 90 tablet 1   colchicine  0.6 MG tablet TAKE 0.5 TABLETS (0.3 MG TOTAL) BY MOUTH DAILY AS NEEDED (GOUT OR PSUEDOGOUT PAIN). 45 tablet 1   diltiazem  (CARDIZEM  CD) 240 MG 24 hr capsule Take 1 capsule (240 mg total) by mouth daily. 90 capsule 3   fexofenadine  (ALLEGRA ) 180 MG tablet Take 1 tablet (180 mg total) by mouth daily. 90 tablet 3   Fluticasone -Umeclidin-Vilant (TRELEGY ELLIPTA ) 200-62.5-25 MCG/ACT AEPB Inhale 1 puff into the lungs daily. 28 each 5   JARDIANCE  25 MG TABS tablet TAKE 1 TABLET BY MOUTH EVERY DAY BEFORE BREAKFAST 90 tablet 3   latanoprost  (XALATAN ) 0.005 % ophthalmic solution Place 1 drop into the left eye at bedtime.     meclizine  (ANTIVERT ) 25 MG tablet TAKE 1 TABLET BY MOUTH 3 TIMES A DAY AS NEEDED FOR DIZZINESS 90 tablet 1  metoprolol  succinate (TOPROL -XL) 100 MG 24 hr tablet Take 1 tablet (100 mg total) by mouth daily. TAKE 1 TABLET BY MOUTH DAILY. TAKE WITH OR IMMEDIATELY FOLLOWING A MEAL. 30 tablet 2   omeprazole  (PRILOSEC) 40 MG capsule Take 1 capsule (40 mg total) by mouth daily. 90 capsule 3   rosuvastatin  (CRESTOR ) 40 MG tablet TAKE 1 TABLET BY MOUTH EVERY DAY 90 tablet 3   tirzepatide  (MOUNJARO ) 2.5 MG/0.5ML Pen Inject 2.5 mg into the skin once a week. 2 mL 11   traZODone  (DESYREL ) 100 MG tablet TAKE 1 TABLET (100 MG TOTAL) BY MOUTH AT BEDTIME AS NEEDED. FOR SLEEP 90  tablet 1   allopurinol  (ZYLOPRIM ) 100 MG tablet TAKE 1 TABLET BY MOUTH EVERY DAY (Patient not taking: Reported on 03/05/2024) 90 tablet 3   dorzolamide  (TRUSOPT ) 2 % ophthalmic solution Place 1 drop into the left eye 3 (three) times daily. (Patient not taking: Reported on 03/05/2024)     No current facility-administered medications for this visit.     Known medication allergies: Allergies  Allergen Reactions   Metformin  And Related Nausea And Vomiting   Ace Inhibitors Swelling        Codeine Nausea And Vomiting and Rash   Hydrocodone  Nausea And Vomiting   Tramadol  Nausea And Vomiting   Tylenol  [Acetaminophen ] Itching and Rash    Allergic to prescription strength tylenol       Physical examination: Blood pressure 132/84, pulse 70, temperature 97.6 F (36.4 C), temperature source Temporal, resp. rate 14, height 5' 3.1 (1.603 m), weight 186 lb 14.4 oz (84.8 kg), SpO2 98%.  General: Alert, interactive, in no acute distress. HEENT: PERRLA, TMs pearly gray, turbinates non-edematous without discharge, post-pharynx non erythematous. Neck: Supple without lymphadenopathy. Lungs: Clear to auscultation without wheezing, rhonchi or rales. {no increased work of breathing. CV: Normal S1, S2 without murmurs. Abdomen: Nondistended, nontender. Skin: Warm and dry, without lesions or rashes. Extremities:  No clubbing, cyanosis or edema. Neuro:   Grossly intact.  Diagnostics/Labs: Labs:  Component     Latest Ref Rng 11/27/2023  D Pteronyssinus IgE     Class 0 kU/L <0.10   D Farinae IgE     Class 0 kU/L <0.10   Cat Dander IgE     Class 0 kU/L <0.10   Dog Dander IgE     Class 0 kU/L <0.10   Mouse Urine IgE     Class 0 kU/L <0.10   [place] Grass IgE     Class 0 kU/L <0.10   Timothy Grass IgE     Class 0 kU/L <0.10   [place] Grass IgE     Class 0 kU/L <0.10   Cockroach, German IgE     Class 0 kU/L <0.10   Penicillium Chrysogen IgE     Class 0 kU/L <0.10   Cladosporium Herbarum IgE      Class 0 kU/L <0.10   Aspergillus Fumigatus IgE     Class 0 kU/L <0.10   Alternaria Alternata IgE     Class 0 kU/L <0.10   Maple/Box Elder IgE     Class 0 kU/L <0.10   Common Silver Valrie IgE     Class 0 kU/L <0.10   Cedar, [place] IgE     Class 0 kU/L <0.10   Oak, White IgE     Class 0 kU/L <0.10   Elm, American IgE     Class 0 kU/L <0.10   Cottonwood IgE     Class 0 kU/L <0.10  Pecan, Hickory IgE     Class 0 kU/L <0.10   White Mulberry IgE     Class 0 kU/L <0.10   Ragweed, Short IgE     Class 0 kU/L <0.10   Pigweed, Rough IgE     Class 0 kU/L <0.10   Sheep Sorrel IgE Qn     Class 0 kU/L <0.10   Class Description Allergens Comment   IgE (Immunoglobulin E), Serum     6 - 495 IU/mL 31   Pork IgE     Class 0 kU/L <0.10   Beef IgE     Class 0 kU/L <0.10   Allergen Lamb IgE     Class 0 kU/L <0.10   O215-IgE Alpha-Gal     Class 0 kU/L <0.10   Thyroperoxidase Ab SerPl-aCnc     0 - 34 IU/mL <9   Thyroglobulin Antibody     0.0 - 0.9 IU/mL <1.0   Anti Nuclear Antibody (ANA)     Negative  Negative   Sed Rate     0 - 40 mm/hr 68 (H)   Tryptase     2.2 - 13.2 ug/L 8.1   cu index     <10  3.5    Spirometry: FEV1 1.0 L 112%; FVC 2.4 L 104% predicted.  This is a nonobstructive pattern  Assessment and plan: Itching, Rash  - Itching/rash can be caused by a variety of different triggers including illness/infection, foods, medications, stings, exercise, pressure, vibrations, extremes of temperature to name a few however majority of the time there is no identifiable trigger.   Labwork from your PCP and new labs from May also normal and reassuring.   - for itch/rash control recommend use of Xyzal  5mg  1 tab twice a day with Pepcid  20mg  1 tab twice a day.   - if above regimen is not effective enough in controlling symptoms then would recommend considering Xolair monthly injections for control of itching/rash.   - Xyzal  is replacing your Cetirizine   Asthma - Daily controller  medication(s): Trelegy 200/62.5/25mcg one puff once daily - Prior to physical activity if needed: albuterol  2 puffs 10-15 minutes before physical activity. - Rescue medications: albuterol  2 puffs every 4-6 hours as needed  - Asthma control goals:  * Full participation in all desired activities (may need albuterol  before activity) * Albuterol  use two time or less a week on average (not counting use with activity) * Cough interfering with sleep two time or less a month * Oral steroids no more than once a year * No hospitalizations  Allergies - Use Xyzal  as above - Environmental allergy panel was negative.  Consider skin testing in future if symptoms worsen/change and medications are not effective enough  Follow-up in 4-6 months or sooner if needed  I appreciate the opportunity to take part in Regnia's care. Please do not hesitate to contact me with questions.  Sincerely,   Brooke Brain, MD Allergy/Immunology [facility]

## 2024-03-05 NOTE — Addendum Note (Signed)
 Addended by: DANIEL NIVIA DEL on: 03/05/2024 01:52 PM   Modules accepted: Orders

## 2024-03-06 ENCOUNTER — Other Ambulatory Visit: Payer: Self-pay | Admitting: Internal Medicine

## 2024-03-18 DIAGNOSIS — R300 Dysuria: Secondary | ICD-10-CM | POA: Diagnosis not present

## 2024-03-18 DIAGNOSIS — N1832 Chronic kidney disease, stage 3b: Secondary | ICD-10-CM | POA: Diagnosis not present

## 2024-03-18 DIAGNOSIS — E1122 Type 2 diabetes mellitus with diabetic chronic kidney disease: Secondary | ICD-10-CM | POA: Diagnosis not present

## 2024-03-18 DIAGNOSIS — D631 Anemia in chronic kidney disease: Secondary | ICD-10-CM | POA: Diagnosis not present

## 2024-03-18 DIAGNOSIS — I129 Hypertensive chronic kidney disease with stage 1 through stage 4 chronic kidney disease, or unspecified chronic kidney disease: Secondary | ICD-10-CM | POA: Diagnosis not present

## 2024-03-19 LAB — LAB REPORT - SCANNED
Albumin, Urine POC: 64.4
Albumin/Creatinine Ratio, Urine, POC: 65
Creatinine, POC: 98.8 mg/dL
EGFR: 31

## 2024-03-30 ENCOUNTER — Ambulatory Visit (INDEPENDENT_AMBULATORY_CARE_PROVIDER_SITE_OTHER): Admitting: Internal Medicine

## 2024-03-30 VITALS — BP 132/68 | HR 63 | Temp 98.2°F | Ht 63.1 in | Wt 180.4 lb

## 2024-03-30 DIAGNOSIS — I10 Essential (primary) hypertension: Secondary | ICD-10-CM

## 2024-03-30 DIAGNOSIS — E1122 Type 2 diabetes mellitus with diabetic chronic kidney disease: Secondary | ICD-10-CM

## 2024-03-30 DIAGNOSIS — N183 Chronic kidney disease, stage 3 unspecified: Secondary | ICD-10-CM | POA: Diagnosis not present

## 2024-03-30 DIAGNOSIS — E1165 Type 2 diabetes mellitus with hyperglycemia: Secondary | ICD-10-CM

## 2024-03-30 DIAGNOSIS — Z23 Encounter for immunization: Secondary | ICD-10-CM | POA: Diagnosis not present

## 2024-03-30 DIAGNOSIS — N3000 Acute cystitis without hematuria: Secondary | ICD-10-CM | POA: Diagnosis not present

## 2024-03-30 DIAGNOSIS — E7800 Pure hypercholesterolemia, unspecified: Secondary | ICD-10-CM

## 2024-03-30 DIAGNOSIS — Z7985 Long-term (current) use of injectable non-insulin antidiabetic drugs: Secondary | ICD-10-CM

## 2024-03-30 DIAGNOSIS — Z7984 Long term (current) use of oral hypoglycemic drugs: Secondary | ICD-10-CM | POA: Diagnosis not present

## 2024-03-30 DIAGNOSIS — R42 Dizziness and giddiness: Secondary | ICD-10-CM | POA: Diagnosis not present

## 2024-03-30 MED ORDER — TIRZEPATIDE 5 MG/0.5ML ~~LOC~~ SOAJ
5.0000 mg | SUBCUTANEOUS | 3 refills | Status: DC
Start: 2024-03-30 — End: 2024-05-05

## 2024-03-30 NOTE — Progress Notes (Unsigned)
 Patient ID: Brooke [name], female   DOB: January 14, 1951, 73 y.o.   MRN: [id number]        Chief Complaint: follow up dm with obesity, dizziness, hld, htn, ckd3a       HPI:  Brooke [name] is a 73 y.o. female here with c/o episodes of lightheaded in the past wk without fall or syncope.  Pt denies chest pain, increased sob or doe, wheezing, orthopnea, PND, increased LE swelling, palpitations, dizziness or syncope.   Pt denies polydipsia, polyuria, or new focal neuro s/s.    Pt denies fever, wt loss, night sweats, loss of appetite, or other constitutional symptoms  Lost 10 lbs in 1 month with better diet and mounjaro  2.5 mg but needs further wt loss.  Also had recent UTI tx with 3 day antibx. Denies urinary symptoms such as dysuria, frequency, urgency, flank pain, hematuria or n/v, fever, chills.  Due for flu shot Wt Readings from Last 3 Encounters:  03/30/24 180 lb 6 oz (81.8 kg)  03/05/24 186 lb 14.4 oz (84.8 kg)  02/26/24 191 lb 3.2 oz (86.7 kg)   BP Readings from Last 3 Encounters:  03/30/24 132/68  03/05/24 132/84  02/26/24 138/64         Past Medical History:  Diagnosis Date   Alopecia    Anxiety    Aortic atherosclerosis (HCC)    Asthma    FOLLOWED BY PCP   Eczema    GERD (gastroesophageal reflux disease)    Gout    04-28-2018--- per pt stable , as been a while since last episode   Heart murmur    History of colon polyps    History of syncope 2015   Hyperlipidemia    Hypertension    Hypothyroidism    OA (osteoarthritis) of knee    bilateral   PVC's (premature ventricular contractions)    Toxic multinodular goiter    03/ 2003  s/p RAI   Type 2 diabetes mellitus (HCC)    followed by pcp   Ventricular tachycardia, polymorphic (HCC) 01/04/2014   primary cardiologist-- dr wilbert turner (hx monitor 2015 showed couplet PVCs, as trigger)   Wears glasses    Wears partial dentures    upper   Past Surgical History:  Procedure Laterality Date   ABDOMINAL HYSTERECTOMY  10/22/1999    WITH BSO   CATARACT EXTRACTION W/ INTRAOCULAR LENS IMPLANT Left YRS AGO   COLONOSCOPY     EXCISION ABDOMINAL WALL MASS  12-20-2005   dr merrilyn @[facility]    neruofibroma   LAPAROSCOPIC CHOLECYSTECTOMY  10/01/2002   dr merrilyn @[facility]    LEFT HEART CATHETERIZATION WITH CORONARY ANGIOGRAM N/A 01/07/2014   Procedure: LEFT HEART CATHETERIZATION WITH CORONARY ANGIOGRAM;  Surgeon: Peter M Swaziland, MD;  Location: [facility] CATH LAB;  Service: Cardiovascular;  Laterality: N/A;   RASTELLI PROCEDURE  6/98 neg   RENAL ARTERY STENT Left 11/2003   angioplasty and stenting   RENAL ARTERY STENT Left 02/2005   re-stenting   TOTAL KNEE ARTHROPLASTY Right 05/05/2018   Procedure: RIGHT TOTAL KNEE ARTHROPLASTY;  Surgeon: Liam Lerner, MD;  Location: [facility];  Service: Orthopedics;  Laterality: Right;   TOTAL KNEE ARTHROPLASTY Left 09/30/2018   Procedure: TOTAL KNEE ARTHROPLASTY;  Surgeon: Sheril Coy, MD;  Location: [facility];  Service: Orthopedics;  Laterality: Left;    reports that she has never smoked. She has never been exposed to tobacco smoke. She has never used smokeless tobacco. She reports that she does not drink alcohol and does  not use drugs. family history includes Diabetes in her mother; Heart disease in her father. Allergies  Allergen Reactions   Metformin  And Related Nausea And Vomiting   Ace Inhibitors Swelling        Codeine Nausea And Vomiting and Rash   Hydrocodone  Nausea And Vomiting   Tramadol  Nausea And Vomiting   Tylenol  [Acetaminophen ] Itching and Rash    Allergic to prescription strength tylenol     Current Outpatient Medications on File Prior to Visit  Medication Sig Dispense Refill   acetaminophen  (TYLENOL ) 325 MG tablet Take 650 mg by mouth every 6 (six) hours as needed for mild pain.     acetaZOLAMIDE (DIAMOX) 250 MG tablet Take by mouth.     albuterol  (VENTOLIN  HFA) 108 (90 Base) MCG/ACT inhaler Inhale 2 puffs into the lungs every 4 (four) hours as needed for wheezing or shortness of breath.  18 g 1   allopurinol  (ZYLOPRIM ) 100 MG tablet TAKE 1 TABLET BY MOUTH EVERY DAY 90 tablet 3   atropine 1 % ophthalmic solution Place 1 drop into the left eye daily.     brimonidine  (ALPHAGAN ) 0.2 % ophthalmic solution 3 (three) times daily. 1 drop in the left eye three times daily (Patient taking differently: Place 1 drop into the left eye 3 (three) times daily. 1 drop in the left eye three times daily   02/24/24: Reports during TOC call, taking BID, (NOT TID))     citalopram  (CELEXA ) 40 MG tablet TAKE 1 TABLET BY MOUTH EVERY DAY 90 tablet 3   clonazePAM  (KLONOPIN ) 0.5 MG tablet Take 1 tablet (0.5 mg total) by mouth daily as needed (vertigo). 90 tablet 1   colchicine  0.6 MG tablet TAKE 0.5 TABLETS (0.3 MG TOTAL) BY MOUTH DAILY AS NEEDED (GOUT OR PSUEDOGOUT PAIN). 45 tablet 1   diltiazem  (CARDIZEM  CD) 240 MG 24 hr capsule Take 1 capsule (240 mg total) by mouth daily. 90 capsule 3   dorzolamide  (TRUSOPT ) 2 % ophthalmic solution Place 1 drop into the left eye 3 (three) times daily.     famotidine  (PEPCID ) 20 MG tablet Take 1 tablet (20 mg total) by mouth 2 (two) times daily. 60 tablet 5   Fluticasone -Umeclidin-Vilant (TRELEGY ELLIPTA ) 200-62.5-25 MCG/ACT AEPB Inhale 1 puff into the lungs daily. 28 each 5   JARDIANCE  25 MG TABS tablet TAKE 1 TABLET BY MOUTH EVERY DAY BEFORE BREAKFAST 90 tablet 3   latanoprost  (XALATAN ) 0.005 % ophthalmic solution Place 1 drop into the left eye at bedtime.     levocetirizine (XYZAL ) 5 MG tablet Take 1 tablet (5 mg total) by mouth daily. 30 tablet 5   meclizine  (ANTIVERT ) 25 MG tablet TAKE 1 TABLET BY MOUTH 3 TIMES A DAY AS NEEDED FOR DIZZINESS 90 tablet 1   omeprazole  (PRILOSEC) 40 MG capsule Take 1 capsule (40 mg total) by mouth daily. 90 capsule 3   rosuvastatin  (CRESTOR ) 40 MG tablet TAKE 1 TABLET BY MOUTH EVERY DAY 90 tablet 3   traZODone  (DESYREL ) 100 MG tablet TAKE 1 TABLET (100 MG TOTAL) BY MOUTH AT BEDTIME AS NEEDED. FOR SLEEP 90 tablet 1   No current  facility-administered medications on file prior to visit.        ROS:  All others reviewed and negative.  Objective        PE:  BP 132/68   Pulse 63   Temp 98.2 F (36.8 C) (Temporal)   Ht 5' 3.1 (1.603 m)   Wt 180 lb 6 oz (81.8 kg)  SpO2 98%   BMI 31.85 kg/m                 Constitutional: Pt appears in NAD               HENT: Head: NCAT.                Right Ear: External ear normal.                 Left Ear: External ear normal.                Eyes: . Pupils are equal, round, and reactive to light. Conjunctivae and EOM are normal               Nose: without d/c or deformity               Neck: Neck supple. Gross normal ROM               Cardiovascular: Normal rate and regular rhythm.                 Pulmonary/Chest: Effort normal and breath sounds without rales or wheezing.                Abd:  Soft, NT, ND, + BS, no organomegaly               Neurological: Pt is alert. At baseline orientation, motor grossly intact               Skin: Skin is warm. No rashes, no other new lesions, LE edema - none               Psychiatric: Pt behavior is normal without agitation   Micro: none  Cardiac tracings I have personally interpreted today:  none  Pertinent Radiological findings (summarize): none   Lab Results  Component Value Date   WBC 8.3 02/26/2024   HGB 10.8 (L) 02/26/2024   HCT 33.6 (L) 02/26/2024   PLT 248.0 02/26/2024   GLUCOSE 95 02/26/2024   CHOL 128 10/02/2023   TRIG 91.0 10/02/2023   HDL 49.40 10/02/2023   LDLDIRECT 139.1 09/05/2009   LDLCALC 61 10/02/2023   ALT 35 02/26/2024   AST 29 02/26/2024   NA 139 02/26/2024   K 3.4 (L) 02/26/2024   CL 108 02/26/2024   CREATININE 1.48 (H) 02/26/2024   BUN 15 02/26/2024   CO2 24 02/26/2024   TSH 1.044 02/18/2024   INR 1.0 09/24/2018   HGBA1C 6.2 02/26/2024   MICROALBUR 1.1 10/02/2023   Assessment/Plan:  Brooke [name] is a 73 y.o. Black or African American [2] female with  has a past medical history of  Alopecia, Anxiety, Aortic atherosclerosis (HCC), Asthma, Eczema, GERD (gastroesophageal reflux disease), Gout, Heart murmur, History of colon polyps, History of syncope (2015), Hyperlipidemia, Hypertension, Hypothyroidism, OA (osteoarthritis) of knee, PVC's (premature ventricular contractions), Toxic multinodular goiter, Type 2 diabetes mellitus (HCC), Ventricular tachycardia, polymorphic (HCC) (01/04/2014), Wears glasses, and Wears partial dentures.  CKD (chronic kidney disease) stage 3, GFR 30-59 ml/min (HCC) Lab Results  Component Value Date   CREATININE 1.48 (H) 02/26/2024   Stable overall, cont to avoid nephrotoxins   Diabetes Lab Results  Component Value Date   HGBA1C 6.2 02/26/2024   With uncontrolled obesity, pt to continue current medical treatment jardiance  25 every day, but for increased mounjaro  5 mg weekly for further wt loss as well   Essential hypertension BP Readings from Last 3  Encounters:  03/30/24 132/68  03/05/24 132/84  02/26/24 138/64   Stable, pt to continue medical treatment card cd 240 every day, toprol  xl 100 qd   Hyperlipidemia Lab Results  Component Value Date   LDLCALC 61 10/02/2023   Stable, pt to continue current statin crestor  40 mg qd   UTI (urinary tract infection) Clinically resolved,  to f/u any worsening symptoms or concerns  Dizziness Etiology unclear, for carotid dopplers  Followup: Return in about 6 months (around 09/27/2024).  Lynwood Rush, MD 04/03/2024 8:26 PM [facility]

## 2024-03-30 NOTE — Patient Instructions (Addendum)
 You had the flu shot today  Ok to increase the moujaro to 5 mg weekly  Please continue all other medications as before, and refills have been done if requested.  Please have the pharmacy call with any other refills you may need.  Please continue your efforts at being more active, low cholesterol diet, and weight control.  Please keep your appointments with your specialists as you may have planned  You will be contacted regarding the referral for: Carotid ultrasound  No further lab work needed today  Please make an Appointment to return in 6 months, or sooner if needed

## 2024-04-03 ENCOUNTER — Other Ambulatory Visit: Payer: Self-pay | Admitting: Cardiology

## 2024-04-03 ENCOUNTER — Other Ambulatory Visit: Payer: Self-pay | Admitting: Internal Medicine

## 2024-04-03 ENCOUNTER — Encounter: Payer: Self-pay | Admitting: Internal Medicine

## 2024-04-03 DIAGNOSIS — R42 Dizziness and giddiness: Secondary | ICD-10-CM | POA: Insufficient documentation

## 2024-04-03 NOTE — Assessment & Plan Note (Signed)
 BP Readings from Last 3 Encounters:  03/30/24 132/68  03/05/24 132/84  02/26/24 138/64   Stable, pt to continue medical treatment card cd 240 every day, toprol  xl 100 qd

## 2024-04-03 NOTE — Assessment & Plan Note (Signed)
 Lab Results  Component Value Date   CREATININE 1.48 (H) 02/26/2024   Stable overall, cont to avoid nephrotoxins

## 2024-04-03 NOTE — Assessment & Plan Note (Signed)
 Clinically resolved,  to f/u any worsening symptoms or concerns

## 2024-04-03 NOTE — Assessment & Plan Note (Addendum)
 Lab Results  Component Value Date   HGBA1C 6.2 02/26/2024   With uncontrolled obesity, pt to continue current medical treatment jardiance  25 every day, but for increased mounjaro  5 mg weekly for further wt loss as well

## 2024-04-03 NOTE — Assessment & Plan Note (Signed)
 Etiology unclear, for carotid dopplers

## 2024-04-03 NOTE — Assessment & Plan Note (Signed)
 Lab Results  Component Value Date   LDLCALC 61 10/02/2023   Stable, pt to continue current statin crestor  40 mg qd

## 2024-04-10 ENCOUNTER — Ambulatory Visit (HOSPITAL_COMMUNITY)
Admission: RE | Admit: 2024-04-10 | Discharge: 2024-04-10 | Disposition: A | Discharge status: Home / Self Care | Source: Ambulatory Visit | Attending: Internal Medicine | Admitting: Internal Medicine

## 2024-04-10 ENCOUNTER — Ambulatory Visit: Payer: Self-pay | Admitting: Internal Medicine

## 2024-04-10 DIAGNOSIS — R42 Dizziness and giddiness: Secondary | ICD-10-CM | POA: Insufficient documentation

## 2024-04-14 ENCOUNTER — Telehealth: Payer: Self-pay

## 2024-04-14 ENCOUNTER — Emergency Department (HOSPITAL_COMMUNITY)

## 2024-04-14 ENCOUNTER — Other Ambulatory Visit: Payer: Self-pay

## 2024-04-14 ENCOUNTER — Inpatient Hospital Stay (HOSPITAL_COMMUNITY)
Admission: EM | Admit: 2024-04-14 | Discharge: 2024-04-16 | DRG: 393 | Disposition: A | Discharge status: Home / Self Care | Attending: Internal Medicine | Admitting: Internal Medicine

## 2024-04-14 DIAGNOSIS — J4489 Other specified chronic obstructive pulmonary disease: Secondary | ICD-10-CM | POA: Diagnosis present

## 2024-04-14 DIAGNOSIS — N183 Chronic kidney disease, stage 3 unspecified: Secondary | ICD-10-CM | POA: Diagnosis present

## 2024-04-14 DIAGNOSIS — K521 Toxic gastroenteritis and colitis: Principal | ICD-10-CM | POA: Diagnosis present

## 2024-04-14 DIAGNOSIS — Z6831 Body mass index (BMI) 31.0-31.9, adult: Secondary | ICD-10-CM

## 2024-04-14 DIAGNOSIS — E860 Dehydration: Secondary | ICD-10-CM | POA: Diagnosis not present

## 2024-04-14 DIAGNOSIS — E66811 Obesity, class 1: Secondary | ICD-10-CM | POA: Diagnosis present

## 2024-04-14 DIAGNOSIS — Z886 Allergy status to analgesic agent status: Secondary | ICD-10-CM

## 2024-04-14 DIAGNOSIS — I129 Hypertensive chronic kidney disease with stage 1 through stage 4 chronic kidney disease, or unspecified chronic kidney disease: Secondary | ICD-10-CM | POA: Diagnosis present

## 2024-04-14 DIAGNOSIS — Z90710 Acquired absence of both cervix and uterus: Secondary | ICD-10-CM

## 2024-04-14 DIAGNOSIS — Z961 Presence of intraocular lens: Secondary | ICD-10-CM | POA: Diagnosis not present

## 2024-04-14 DIAGNOSIS — K5730 Diverticulosis of large intestine without perforation or abscess without bleeding: Secondary | ICD-10-CM | POA: Diagnosis not present

## 2024-04-14 DIAGNOSIS — Z860100 Personal history of colon polyps, unspecified: Secondary | ICD-10-CM | POA: Diagnosis not present

## 2024-04-14 DIAGNOSIS — F32A Depression, unspecified: Secondary | ICD-10-CM | POA: Diagnosis present

## 2024-04-14 DIAGNOSIS — E1122 Type 2 diabetes mellitus with diabetic chronic kidney disease: Secondary | ICD-10-CM | POA: Diagnosis present

## 2024-04-14 DIAGNOSIS — Z7984 Long term (current) use of oral hypoglycemic drugs: Secondary | ICD-10-CM

## 2024-04-14 DIAGNOSIS — K529 Noninfective gastroenteritis and colitis, unspecified: Secondary | ICD-10-CM

## 2024-04-14 DIAGNOSIS — Z8249 Family history of ischemic heart disease and other diseases of the circulatory system: Secondary | ICD-10-CM | POA: Diagnosis not present

## 2024-04-14 DIAGNOSIS — R1084 Generalized abdominal pain: Principal | ICD-10-CM

## 2024-04-14 DIAGNOSIS — E876 Hypokalemia: Secondary | ICD-10-CM | POA: Diagnosis present

## 2024-04-14 DIAGNOSIS — T383X5A Adverse effect of insulin and oral hypoglycemic [antidiabetic] drugs, initial encounter: Secondary | ICD-10-CM | POA: Diagnosis present

## 2024-04-14 DIAGNOSIS — Z79899 Other long term (current) drug therapy: Secondary | ICD-10-CM | POA: Diagnosis not present

## 2024-04-14 DIAGNOSIS — N1832 Chronic kidney disease, stage 3b: Secondary | ICD-10-CM | POA: Diagnosis present

## 2024-04-14 DIAGNOSIS — R112 Nausea with vomiting, unspecified: Secondary | ICD-10-CM | POA: Diagnosis not present

## 2024-04-14 DIAGNOSIS — E119 Type 2 diabetes mellitus without complications: Secondary | ICD-10-CM

## 2024-04-14 DIAGNOSIS — E0800 Diabetes mellitus due to underlying condition with hyperosmolarity without nonketotic hyperglycemic-hyperosmolar coma (NKHHC): Secondary | ICD-10-CM

## 2024-04-14 DIAGNOSIS — D631 Anemia in chronic kidney disease: Secondary | ICD-10-CM | POA: Diagnosis present

## 2024-04-14 DIAGNOSIS — N179 Acute kidney failure, unspecified: Secondary | ICD-10-CM | POA: Diagnosis not present

## 2024-04-14 DIAGNOSIS — K219 Gastro-esophageal reflux disease without esophagitis: Secondary | ICD-10-CM | POA: Diagnosis present

## 2024-04-14 DIAGNOSIS — Z888 Allergy status to other drugs, medicaments and biological substances status: Secondary | ICD-10-CM

## 2024-04-14 DIAGNOSIS — R109 Unspecified abdominal pain: Secondary | ICD-10-CM | POA: Diagnosis not present

## 2024-04-14 DIAGNOSIS — Z9842 Cataract extraction status, left eye: Secondary | ICD-10-CM

## 2024-04-14 DIAGNOSIS — E785 Hyperlipidemia, unspecified: Secondary | ICD-10-CM | POA: Diagnosis present

## 2024-04-14 DIAGNOSIS — K7689 Other specified diseases of liver: Secondary | ICD-10-CM | POA: Diagnosis not present

## 2024-04-14 DIAGNOSIS — Z833 Family history of diabetes mellitus: Secondary | ICD-10-CM

## 2024-04-14 DIAGNOSIS — U071 COVID-19: Secondary | ICD-10-CM | POA: Diagnosis present

## 2024-04-14 DIAGNOSIS — Z7985 Long-term (current) use of injectable non-insulin antidiabetic drugs: Secondary | ICD-10-CM

## 2024-04-14 DIAGNOSIS — E039 Hypothyroidism, unspecified: Secondary | ICD-10-CM | POA: Diagnosis present

## 2024-04-14 DIAGNOSIS — I10 Essential (primary) hypertension: Secondary | ICD-10-CM | POA: Diagnosis not present

## 2024-04-14 DIAGNOSIS — Z96653 Presence of artificial knee joint, bilateral: Secondary | ICD-10-CM | POA: Diagnosis present

## 2024-04-14 DIAGNOSIS — M109 Gout, unspecified: Secondary | ICD-10-CM | POA: Diagnosis not present

## 2024-04-14 LAB — COMPREHENSIVE METABOLIC PANEL WITH GFR
ALT: 27 U/L (ref 0–44)
AST: 27 U/L (ref 15–41)
Albumin: 3.5 g/dL (ref 3.5–5.0)
Alkaline Phosphatase: 132 U/L — ABNORMAL HIGH (ref 38–126)
Anion gap: 12 (ref 5–15)
BUN: 16 mg/dL (ref 8–23)
CO2: 17 mmol/L — ABNORMAL LOW (ref 22–32)
Calcium: 9.5 mg/dL (ref 8.9–10.3)
Chloride: 109 mmol/L (ref 98–111)
Creatinine, Ser: 2.1 mg/dL — ABNORMAL HIGH (ref 0.44–1.00)
GFR, Estimated: 24 mL/min — ABNORMAL LOW (ref >60)
Glucose, Bld: 106 mg/dL — ABNORMAL HIGH (ref 70–99)
Potassium: 2.6 mmol/L — CL (ref 3.5–5.1)
Sodium: 139 mmol/L (ref 135–145)
Total Bilirubin: 0.3 mg/dL (ref 0.0–1.2)
Total Protein: 6.9 g/dL (ref 6.5–8.1)

## 2024-04-14 LAB — CBC
HCT: 35.6 % — ABNORMAL LOW (ref 36.0–46.0)
Hemoglobin: 11.3 g/dL — ABNORMAL LOW (ref 12.0–15.0)
MCH: 25.9 pg — ABNORMAL LOW (ref 26.0–34.0)
MCHC: 31.7 g/dL (ref 30.0–36.0)
MCV: 81.7 fL (ref 80.0–100.0)
Platelets: 294 K/uL (ref 150–400)
RBC: 4.36 MIL/uL (ref 3.87–5.11)
RDW: 17.7 % — ABNORMAL HIGH (ref 11.5–15.5)
WBC: 8.5 K/uL (ref 4.0–10.5)
nRBC: 0 % (ref 0.0–0.2)

## 2024-04-14 LAB — LIPASE, BLOOD: Lipase: 12 U/L (ref 11–51)

## 2024-04-14 MED ORDER — ONDANSETRON 4 MG PO TBDP: 4.0000 mg | ORAL_TABLET | Freq: Once | ORAL | Status: DC | PRN

## 2024-04-14 MED ORDER — POTASSIUM CHLORIDE 10 MEQ/100ML IV SOLN
10.0000 meq | INTRAVENOUS | Status: AC
  Administered 2024-04-14 – 2024-04-15 (×5): 10 meq via INTRAVENOUS
  Filled 2024-04-14 (×5): qty 100

## 2024-04-14 MED ORDER — MAGNESIUM SULFATE 2 GM/50ML IV SOLN
2.0000 g | Freq: Once | INTRAVENOUS | Status: AC
  Administered 2024-04-14: 2 g via INTRAVENOUS
  Filled 2024-04-14: qty 50

## 2024-04-14 MED ORDER — SODIUM CHLORIDE 0.9 % IV BOLUS
1000.0000 mL | Freq: Once | INTRAVENOUS | Status: AC
  Administered 2024-04-14: 1000 mL via INTRAVENOUS

## 2024-04-14 NOTE — ED Triage Notes (Signed)
 Pt BIB [facility] from home Emesis over the past 2 weeks. Reports unable to tolerate fluids. Reports emesis x2 today. No coffee emesis/ bright red blood. No fevers. No diarrhea. Does endorse dizziness with changing position - sts this has not changed since the beginning of this year.   [facility] VS 132/64, PR 82, 98 RA, CBG 107

## 2024-04-14 NOTE — Telephone Encounter (Signed)
 Copied from CRM #[id number]. Topic: Clinical - Lab/Test Results >> Apr 14, 2024  9:44 AM Mesmerise C wrote: Reason for CRM: Patient calling to get the results for her VAS US  Cartotid she doesn't have access to her MyChart plkeae give her a call to provide the results

## 2024-04-14 NOTE — ED Provider Notes (Incomplete)
 [place] EMERGENCY DEPARTMENT AT [facility] Provider Note   CSN: [id number] Arrival date & time: 04/14/24  2101     Patient presents with: Emesis   Brooke [name] is a 73 y.o. female.  {Add pertinent medical, surgical, social history, OB history to HPI:7814} 73 year old female with history of polymorphic v-tach, HLD, GERD, DM, presents with complaint of periumbilical abdominal pain, vomiting, feeling light headed onset yesterday. Symptoms continued through today, also with cough and congestion. Denies changes in bowel or bladder habits, fevers, chills.        Prior to Admission medications   Medication Sig Start Date End Date Taking? Authorizing Provider  acetaminophen  (TYLENOL ) 325 MG tablet Take 650 mg by mouth every 6 (six) hours as needed for mild pain.    Norleen Lynwood ORN, MD  acetaZOLAMIDE (DIAMOX) 250 MG tablet Take by mouth. 08/21/22   [provider]  albuterol  (VENTOLIN  HFA) 108 (90 Base) MCG/ACT inhaler Inhale 2 puffs into the lungs every 4 (four) hours as needed for wheezing or shortness of breath. 03/05/24   Jeneal Danita Macintosh, MD  allopurinol  (ZYLOPRIM ) 100 MG tablet TAKE 1 TABLET BY MOUTH EVERY DAY 07/08/23   Norleen Lynwood ORN, MD  atropine 1 % ophthalmic solution Place 1 drop into the left eye daily. 01/16/24   [provider]  Azelastine  HCl 137 MCG/SPRAY SOLN PLACE 2 SPRAYS INTO BOTH NOSTRILS 2 (TWO) TIMES DAILY. USE IN [facility] NOSTRIL AS DIRECTED 04/03/24   Norleen Lynwood ORN, MD  brimonidine  (ALPHAGAN ) 0.2 % ophthalmic solution 3 (three) times daily. 1 drop in the left eye three times daily Patient taking differently: Place 1 drop into the left eye 3 (three) times daily. 1 drop in the left eye three times daily   02/24/24: Reports during TOC call, taking BID, (NOT TID)    [provider]  citalopram  (CELEXA ) 40 MG tablet TAKE 1 TABLET BY MOUTH EVERY DAY 03/06/24   Norleen Lynwood ORN, MD  clonazePAM  (KLONOPIN ) 0.5 MG tablet Take 1 tablet  (0.5 mg total) by mouth daily as needed (vertigo). 02/25/23   Norleen Lynwood ORN, MD  colchicine  0.6 MG tablet TAKE 0.5 TABLETS (0.3 MG TOTAL) BY MOUTH DAILY AS NEEDED (GOUT OR PSUEDOGOUT PAIN). 10/01/22   Joane Artist RAMAN, MD  diltiazem  (CARDIZEM  CD) 240 MG 24 hr capsule Take 1 capsule (240 mg total) by mouth daily. 10/02/23   Norleen Lynwood ORN, MD  dorzolamide  (TRUSOPT ) 2 % ophthalmic solution Place 1 drop into the left eye 3 (three) times daily.    [provider]  famotidine  (PEPCID ) 20 MG tablet Take 1 tablet (20 mg total) by mouth 2 (two) times daily. 03/05/24   Jeneal Danita Macintosh, MD  Fluticasone -Umeclidin-Vilant (TRELEGY ELLIPTA ) 200-62.5-25 MCG/ACT AEPB Inhale 1 puff into the lungs daily. 03/05/24   Jeneal Danita Macintosh, MD  JARDIANCE  25 MG TABS tablet TAKE 1 TABLET BY MOUTH EVERY DAY BEFORE BREAKFAST 11/26/23   Norleen Lynwood ORN, MD  latanoprost  (XALATAN ) 0.005 % ophthalmic solution Place 1 drop into the left eye at bedtime.    [provider]  levocetirizine (XYZAL ) 5 MG tablet Take 1 tablet (5 mg total) by mouth daily. 03/05/24   Jeneal Danita Macintosh, MD  meclizine  (ANTIVERT ) 25 MG tablet TAKE 1 TABLET BY MOUTH 3 TIMES A DAY AS NEEDED FOR DIZZINESS 11/04/23   Norleen Lynwood ORN, MD  metoprolol  succinate (TOPROL -XL) 100 MG 24 hr tablet TAKE 1 TABLET BY MOUTH 2 (TWO) TIMES DAILY. TAKE WITH  OR IMMEDIATELY FOLLOWING A MEAL. 04/03/24   Shlomo Wilbert SAUNDERS, MD  omeprazole  (PRILOSEC) 40 MG capsule Take 1 capsule (40 mg total) by mouth daily. 12/05/21   Norleen Lynwood ORN, MD  rosuvastatin  (CRESTOR ) 40 MG tablet TAKE 1 TABLET BY MOUTH EVERY DAY 04/03/23   Shlomo Wilbert SAUNDERS, MD  tirzepatide  (MOUNJARO ) 5 MG/0.5ML Pen Inject 5 mg into the skin once a week. 03/30/24   Norleen Lynwood ORN, MD  traZODone  (DESYREL ) 100 MG tablet TAKE 1 TABLET (100 MG TOTAL) BY MOUTH AT BEDTIME AS NEEDED. FOR SLEEP 03/03/24   Norleen Lynwood ORN, MD    Allergies: Metformin  and related, Ace inhibitors, Codeine, Hydrocodone , Tramadol , and Tylenol   [acetaminophen ]    Review of Systems Negative except as per HPI Updated Vital Signs BP 131/75 (BP Location: Left Arm)   Pulse 73   Temp 98.3 F (36.8 C) (Oral)   Resp 19   SpO2 100%   Physical Exam Vitals and nursing note reviewed.  Constitutional:      General: She is not in acute distress.    Appearance: She is well-developed. She is not diaphoretic.  HENT:     Head: Normocephalic and atraumatic.     Mouth/Throat:     Mouth: Mucous membranes are dry.  Cardiovascular:     Rate and Rhythm: Normal rate and regular rhythm.     Heart sounds: Normal heart sounds.  Pulmonary:     Effort: Pulmonary effort is normal.     Breath sounds: Normal breath sounds.  Abdominal:     Palpations: Abdomen is soft.     Tenderness: There is abdominal tenderness in the right lower quadrant, periumbilical area and left upper quadrant. There is no guarding or rebound.  Musculoskeletal:     Right lower leg: No edema.     Left lower leg: No edema.  Skin:    General: Skin is warm and dry.  Neurological:     Mental Status: She is alert and oriented to person, place, and time.  Psychiatric:        Behavior: Behavior normal.     (all labs ordered are listed, but only abnormal results are displayed) Labs Reviewed  COMPREHENSIVE METABOLIC PANEL WITH GFR - Abnormal; Notable for the following components:      Result Value   Potassium 2.6 (*)    CO2 17 (*)    Glucose, Bld 106 (*)    Creatinine, Ser 2.10 (*)    Alkaline Phosphatase 132 (*)    GFR, Estimated 24 (*)    All other components within normal limits  CBC - Abnormal; Notable for the following components:   Hemoglobin 11.3 (*)    HCT 35.6 (*)    MCH 25.9 (*)    RDW 17.7 (*)    All other components within normal limits  URINALYSIS, ROUTINE W REFLEX MICROSCOPIC    EKG: None  Radiology: No results found.  {Document cardiac monitor, telemetry assessment procedure when appropriate:32947} Procedures   Medications Ordered in the ED   ondansetron  (ZOFRAN -ODT) disintegrating tablet 4 mg (has no administration in time range)      {Click here for ABCD2, HEART and other calculators REFRESH Note before signing:1}                              Medical Decision Making  This patient presents to the ED for concern of abd pain, vomiting, light headedness, this involves an extensive number of  treatment options, and is a complaint that carries with it a high risk of complications and morbidity.  The differential diagnosis includes ***   Co morbidities / Chronic conditions that complicate the patient evaluation  ***   Additional history obtained:  Additional history obtained from EMR External records from outside source obtained and reviewed including ***   Lab Tests:  I Ordered, and personally interpreted labs.  The pertinent results include:  ***   Imaging Studies ordered:  I ordered imaging studies including ***  I independently visualized and interpreted imaging which showed *** I agree with the radiologist interpretation   Cardiac Monitoring: / EKG:  The patient was maintained on a cardiac monitor.  I personally viewed and interpreted the cardiac monitored which showed an underlying rhythm of: ***   Problem List / ED Course / Critical interventions / Medication management  *** I ordered medication including ***   Reevaluation of the patient after these medicines showed that the patient *** I have reviewed the patients home medicines and have made adjustments as needed   Consultations Obtained:  I requested consultation with the ***,  and discussed lab and imaging findings as well as pertinent plan - they recommend: ***   Social Determinants of Health:  ***   Test / Admission - Considered:  ***   {Document critical care time when appropriate  Document review of labs and clinical decision tools ie CHADS2VASC2, etc  Document your independent review of radiology images and any outside records   Document your discussion with family members, caretakers and with consultants  Document social determinants of health affecting pt's care  Document your decision making why or why not admission, treatments were needed:32947:::1}   Final diagnoses:  None    ED Discharge Orders     None

## 2024-04-15 ENCOUNTER — Encounter (HOSPITAL_COMMUNITY): Payer: Self-pay | Admitting: Internal Medicine

## 2024-04-15 DIAGNOSIS — Z860100 Personal history of colon polyps, unspecified: Secondary | ICD-10-CM | POA: Diagnosis not present

## 2024-04-15 DIAGNOSIS — R112 Nausea with vomiting, unspecified: Secondary | ICD-10-CM | POA: Diagnosis present

## 2024-04-15 DIAGNOSIS — E876 Hypokalemia: Secondary | ICD-10-CM | POA: Diagnosis not present

## 2024-04-15 DIAGNOSIS — K529 Noninfective gastroenteritis and colitis, unspecified: Secondary | ICD-10-CM | POA: Diagnosis not present

## 2024-04-15 DIAGNOSIS — N179 Acute kidney failure, unspecified: Secondary | ICD-10-CM

## 2024-04-15 DIAGNOSIS — E66811 Obesity, class 1: Secondary | ICD-10-CM | POA: Diagnosis present

## 2024-04-15 DIAGNOSIS — R1084 Generalized abdominal pain: Secondary | ICD-10-CM | POA: Diagnosis not present

## 2024-04-15 DIAGNOSIS — Z961 Presence of intraocular lens: Secondary | ICD-10-CM | POA: Diagnosis present

## 2024-04-15 DIAGNOSIS — F32A Depression, unspecified: Secondary | ICD-10-CM | POA: Diagnosis present

## 2024-04-15 DIAGNOSIS — Z8249 Family history of ischemic heart disease and other diseases of the circulatory system: Secondary | ICD-10-CM | POA: Diagnosis not present

## 2024-04-15 DIAGNOSIS — U071 COVID-19: Secondary | ICD-10-CM | POA: Insufficient documentation

## 2024-04-15 DIAGNOSIS — Z90710 Acquired absence of both cervix and uterus: Secondary | ICD-10-CM | POA: Diagnosis not present

## 2024-04-15 DIAGNOSIS — E0800 Diabetes mellitus due to underlying condition with hyperosmolarity without nonketotic hyperglycemic-hyperosmolar coma (NKHHC): Secondary | ICD-10-CM | POA: Diagnosis not present

## 2024-04-15 DIAGNOSIS — I129 Hypertensive chronic kidney disease with stage 1 through stage 4 chronic kidney disease, or unspecified chronic kidney disease: Secondary | ICD-10-CM | POA: Diagnosis present

## 2024-04-15 DIAGNOSIS — E1165 Type 2 diabetes mellitus with hyperglycemia: Secondary | ICD-10-CM | POA: Diagnosis not present

## 2024-04-15 DIAGNOSIS — M109 Gout, unspecified: Secondary | ICD-10-CM | POA: Diagnosis present

## 2024-04-15 DIAGNOSIS — J4489 Other specified chronic obstructive pulmonary disease: Secondary | ICD-10-CM | POA: Diagnosis present

## 2024-04-15 DIAGNOSIS — Z7984 Long term (current) use of oral hypoglycemic drugs: Secondary | ICD-10-CM | POA: Diagnosis not present

## 2024-04-15 DIAGNOSIS — Z96653 Presence of artificial knee joint, bilateral: Secondary | ICD-10-CM | POA: Diagnosis present

## 2024-04-15 DIAGNOSIS — E1122 Type 2 diabetes mellitus with diabetic chronic kidney disease: Secondary | ICD-10-CM | POA: Diagnosis present

## 2024-04-15 DIAGNOSIS — K219 Gastro-esophageal reflux disease without esophagitis: Secondary | ICD-10-CM | POA: Diagnosis present

## 2024-04-15 DIAGNOSIS — D631 Anemia in chronic kidney disease: Secondary | ICD-10-CM | POA: Diagnosis present

## 2024-04-15 DIAGNOSIS — Z833 Family history of diabetes mellitus: Secondary | ICD-10-CM | POA: Diagnosis not present

## 2024-04-15 DIAGNOSIS — Z7985 Long-term (current) use of injectable non-insulin antidiabetic drugs: Secondary | ICD-10-CM | POA: Diagnosis not present

## 2024-04-15 DIAGNOSIS — E039 Hypothyroidism, unspecified: Secondary | ICD-10-CM | POA: Diagnosis present

## 2024-04-15 DIAGNOSIS — E785 Hyperlipidemia, unspecified: Secondary | ICD-10-CM | POA: Diagnosis present

## 2024-04-15 DIAGNOSIS — N1832 Chronic kidney disease, stage 3b: Secondary | ICD-10-CM | POA: Diagnosis present

## 2024-04-15 DIAGNOSIS — K521 Toxic gastroenteritis and colitis: Secondary | ICD-10-CM | POA: Diagnosis present

## 2024-04-15 DIAGNOSIS — Z79899 Other long term (current) drug therapy: Secondary | ICD-10-CM | POA: Diagnosis not present

## 2024-04-15 LAB — BASIC METABOLIC PANEL WITH GFR
Anion gap: 11 (ref 5–15)
BUN: 14 mg/dL (ref 8–23)
CO2: 16 mmol/L — ABNORMAL LOW (ref 22–32)
Calcium: 8.3 mg/dL — ABNORMAL LOW (ref 8.9–10.3)
Chloride: 111 mmol/L (ref 98–111)
Creatinine, Ser: 1.83 mg/dL — ABNORMAL HIGH (ref 0.44–1.00)
GFR, Estimated: 29 mL/min — ABNORMAL LOW (ref >60)
Glucose, Bld: 96 mg/dL (ref 70–99)
Potassium: 3 mmol/L — ABNORMAL LOW (ref 3.5–5.1)
Sodium: 138 mmol/L (ref 135–145)

## 2024-04-15 LAB — URINALYSIS, MICROSCOPIC (REFLEX)

## 2024-04-15 LAB — RESP PANEL BY RT-PCR (RSV, FLU A&B, COVID)  RVPGX2
Influenza A by PCR: NEGATIVE
Influenza B by PCR: NEGATIVE
Resp Syncytial Virus by PCR: NEGATIVE
SARS Coronavirus 2 by RT PCR: POSITIVE — AB

## 2024-04-15 LAB — URINALYSIS, ROUTINE W REFLEX MICROSCOPIC
Bilirubin Urine: NEGATIVE
Glucose, UA: >=500 mg/dL — AB
Ketones, ur: NEGATIVE mg/dL
Leukocytes,Ua: NEGATIVE
Nitrite: NEGATIVE
Protein, ur: 100 mg/dL — AB
Specific Gravity, Urine: 1.02 (ref 1.005–1.030)
pH: 6.5 (ref 5.0–8.0)

## 2024-04-15 LAB — COMPREHENSIVE METABOLIC PANEL WITH GFR
ALT: 22 U/L (ref 0–44)
AST: 22 U/L (ref 15–41)
Albumin: 3.1 g/dL — ABNORMAL LOW (ref 3.5–5.0)
Alkaline Phosphatase: 121 U/L (ref 38–126)
Anion gap: 14 (ref 5–15)
BUN: 16 mg/dL (ref 8–23)
CO2: 16 mmol/L — ABNORMAL LOW (ref 22–32)
Calcium: 8.4 mg/dL — ABNORMAL LOW (ref 8.9–10.3)
Chloride: 111 mmol/L (ref 98–111)
Creatinine, Ser: 1.89 mg/dL — ABNORMAL HIGH (ref 0.44–1.00)
GFR, Estimated: 28 mL/min — ABNORMAL LOW (ref >60)
Glucose, Bld: 97 mg/dL (ref 70–99)
Potassium: 3 mmol/L — ABNORMAL LOW (ref 3.5–5.1)
Sodium: 141 mmol/L (ref 135–145)
Total Bilirubin: 0.3 mg/dL (ref 0.0–1.2)
Total Protein: 5.9 g/dL — ABNORMAL LOW (ref 6.5–8.1)

## 2024-04-15 LAB — GLUCOSE, CAPILLARY
Glucose-Capillary: 125 mg/dL — ABNORMAL HIGH (ref 70–99)
Glucose-Capillary: 80 mg/dL (ref 70–99)
Glucose-Capillary: 110 mg/dL — ABNORMAL HIGH (ref 70–99)
Glucose-Capillary: 98 mg/dL (ref 70–99)

## 2024-04-15 LAB — CBC
HCT: 32.6 % — ABNORMAL LOW (ref 36.0–46.0)
Hemoglobin: 10.2 g/dL — ABNORMAL LOW (ref 12.0–15.0)
MCH: 25.8 pg — ABNORMAL LOW (ref 26.0–34.0)
MCHC: 31.3 g/dL (ref 30.0–36.0)
MCV: 82.5 fL (ref 80.0–100.0)
Platelets: 250 K/uL (ref 150–400)
RBC: 3.95 MIL/uL (ref 3.87–5.11)
RDW: 17.8 % — ABNORMAL HIGH (ref 11.5–15.5)
WBC: 8.5 K/uL (ref 4.0–10.5)
nRBC: 0 % (ref 0.0–0.2)

## 2024-04-15 LAB — MAGNESIUM: Magnesium: 2.4 mg/dL (ref 1.7–2.4)

## 2024-04-15 MED ORDER — CITALOPRAM HYDROBROMIDE 20 MG PO TABS
20.0000 mg | ORAL_TABLET | Freq: Every day | ORAL | Status: DC
  Administered 2024-04-16: 20 mg via ORAL
  Filled 2024-04-15: qty 1

## 2024-04-15 MED ORDER — ALBUTEROL SULFATE HFA 108 (90 BASE) MCG/ACT IN AERS: 2.0000 | INHALATION_SPRAY | RESPIRATORY_TRACT | Status: DC | PRN

## 2024-04-15 MED ORDER — DORZOLAMIDE HCL 2 % OP SOLN: 1.0000 [drp] | Freq: Three times a day (TID) | OPHTHALMIC | Status: DC

## 2024-04-15 MED ORDER — LATANOPROST 0.005 % OP SOLN
1.0000 [drp] | Freq: Every day | OPHTHALMIC | Status: DC
  Administered 2024-04-15: 1 [drp] via OPHTHALMIC
  Filled 2024-04-15: qty 2.5

## 2024-04-15 MED ORDER — HEPARIN SODIUM (PORCINE) 5000 UNIT/ML IJ SOLN
5000.0000 [IU] | Freq: Three times a day (TID) | INTRAMUSCULAR | Status: DC
  Administered 2024-04-15 – 2024-04-16 (×4): 5000 [IU] via SUBCUTANEOUS
  Filled 2024-04-15 (×4): qty 1

## 2024-04-15 MED ORDER — ALBUTEROL SULFATE (2.5 MG/3ML) 0.083% IN NEBU: 2.5000 mg | INHALATION_SOLUTION | RESPIRATORY_TRACT | Status: DC | PRN

## 2024-04-15 MED ORDER — ROSUVASTATIN CALCIUM 10 MG PO TABS
40.0000 mg | ORAL_TABLET | Freq: Every day | ORAL | Status: DC
  Administered 2024-04-15 – 2024-04-16 (×2): 40 mg via ORAL
  Filled 2024-04-15 (×2): qty 4

## 2024-04-15 MED ORDER — POTASSIUM CHLORIDE CRYS ER 20 MEQ PO TBCR
40.0000 meq | EXTENDED_RELEASE_TABLET | Freq: Once | ORAL | Status: AC
  Administered 2024-04-15: 40 meq via ORAL
  Filled 2024-04-15: qty 2

## 2024-04-15 MED ORDER — ONDANSETRON HCL 4 MG/2ML IJ SOLN
4.0000 mg | Freq: Once | INTRAMUSCULAR | Status: AC
  Administered 2024-04-15: 4 mg via INTRAVENOUS
  Filled 2024-04-15: qty 2

## 2024-04-15 MED ORDER — CITALOPRAM HYDROBROMIDE 20 MG PO TABS
40.0000 mg | ORAL_TABLET | Freq: Every day | ORAL | Status: DC
Start: 2024-04-15 — End: 2024-04-15
  Administered 2024-04-15: 40 mg via ORAL
  Filled 2024-04-15: qty 2

## 2024-04-15 MED ORDER — TRAZODONE HCL 100 MG PO TABS: 100.0000 mg | ORAL_TABLET | Freq: Every evening | ORAL | Status: DC | PRN

## 2024-04-15 MED ORDER — DILTIAZEM HCL ER COATED BEADS 240 MG PO CP24
240.0000 mg | ORAL_CAPSULE | Freq: Every day | ORAL | Status: DC
  Administered 2024-04-15 – 2024-04-16 (×2): 240 mg via ORAL
  Filled 2024-04-15 (×2): qty 1

## 2024-04-15 MED ORDER — METOPROLOL SUCCINATE ER 50 MG PO TB24
100.0000 mg | ORAL_TABLET | Freq: Two times a day (BID) | ORAL | Status: DC
  Administered 2024-04-15 – 2024-04-16 (×3): 100 mg via ORAL
  Filled 2024-04-15 (×3): qty 2

## 2024-04-15 MED ORDER — BUDESON-GLYCOPYRROL-FORMOTEROL 160-9-4.8 MCG/ACT IN AERO
2.0000 | INHALATION_SPRAY | Freq: Two times a day (BID) | RESPIRATORY_TRACT | Status: DC
  Administered 2024-04-15: 2 via RESPIRATORY_TRACT
  Filled 2024-04-15: qty 5.9

## 2024-04-15 MED ORDER — BRIMONIDINE TARTRATE 0.2 % OP SOLN
1.0000 [drp] | Freq: Two times a day (BID) | OPHTHALMIC | Status: DC
  Administered 2024-04-15 – 2024-04-16 (×3): 1 [drp] via OPHTHALMIC
  Filled 2024-04-15: qty 5

## 2024-04-15 MED ORDER — INSULIN ASPART 100 UNIT/ML IJ SOLN
0.0000 [IU] | Freq: Three times a day (TID) | INTRAMUSCULAR | Status: DC
  Administered 2024-04-15: 1 [IU] via SUBCUTANEOUS

## 2024-04-15 MED ORDER — LACTATED RINGERS IV SOLN
INTRAVENOUS | Status: AC
  Filled 2024-04-15 (×3): qty 1000

## 2024-04-15 MED ORDER — ALLOPURINOL 100 MG PO TABS
100.0000 mg | ORAL_TABLET | Freq: Every day | ORAL | Status: DC
  Administered 2024-04-15 – 2024-04-16 (×2): 100 mg via ORAL
  Filled 2024-04-15 (×2): qty 1

## 2024-04-15 NOTE — Progress Notes (Signed)
 [facility]                                                                              Brooke [name], is a 73 y.o. female, DOB - 12/02/1950, FMW:[id number] Admit date - 04/14/2024    Outpatient Primary MD for the patient is Brooke [name] ORN, MD  LOS - 0  days  Chief Complaint  Patient presents with   Emesis       Brief summary   Patient is a 73 year old female with diabetes mellitus type 2, hypertension, chronic kidney disease stage III, gout, COPD, GERD presents to the ER with complaints of nausea vomiting unable to eat anything for the last 1 month.  Patient also was admitted with weakness and poor oral intake on February 18, 2024.  Denies any abdominal pain or diarrhea.  Patient was started on Mounjaro  about 8 months ago and her dose was increased last month.  Denies any chest pain productive cough fever chills recent travel sick contacts.   ED Course: In the ER CTs abdomen pelvis shows ascending colon colitis. Patient is afebrile.  Labs show severe hypokalemia with acute on chronic kidney disease was started on potassium replacement and also IV fluids.  COVID-19 test is positive.  But patient is not hypoxic.  Admitted for further observation.   Assessment & Plan    Principal Problem:   Nausea & vomiting, ascending colitis vs related to Mounjaro  - Currently tolerating regular diet - Continue hydration. - If she develops fevers, leukocytosis, worsening abdominal pain or diarrhea, will place on antibiotics - Patient recommended to hold Mounjaro , takes it usually on Monday (already taken this week) and discussed with PCP  COVID-19 infection  - No hypoxia or shortness of breath  - Continue to observe  Hypertension - Continue metoprolol , Cardizem   AKI on CKD stage III - Baseline creatinine 1.4-1.5 presented with creatinine of 2.1 - Continue IV fluid hydration  Hypokalemia - Replaced   COPD -Stable, no active wheezing, continue Trelegy  Diabetes mellitus type  2, NIDDM - On Jardiance  and Mounjaro  - Last A1c 6.2 on 02/26/2024 - Continue SSI while inpatient  CBG (last 3)  Recent Labs    04/15/24 0724 04/15/24 1113  GLUCAP 125* 80     Depression - Continue Celexa , trazodone   Chronic anemia, normocytic - Baseline hemoglobin 9-10, currently at baseline   Hyperlipidemia -On statin  History of glaucoma - Continue outpatient eyedrops  Obesity class I Estimated body mass index is 31.85 kg/m as calculated from the following:   Height as of 03/30/24: 5' 3.1 (1.603 m).   Weight as of 03/30/24: 81.8 kg.  Code Status: Full code DVT Prophylaxis:  heparin  injection 5,000 Units Start: 04/15/24 0600   Level of Care: Level of care: Telemetry Family Communication: Updated patient Disposition Plan:      Remains inpatient appropriate: Hopefully DC home tomorrow   Procedures:    Consultants:     Antimicrobials:   Anti-infectives (From admission, onward)    None          Medications  allopurinol   100 mg Oral Daily   brimonidine   1 drop Left Eye  BID   budesonide -glycopyrrolate -formoterol   2 puff Inhalation BID   [START ON 04/16/2024] citalopram   20 mg Oral Daily   diltiazem   240 mg Oral Daily   heparin   5,000 Units Subcutaneous Q8H   insulin  aspart  0-9 Units Subcutaneous TID WC   latanoprost   1 drop Left Eye QHS   metoprolol  succinate  100 mg Oral BID   rosuvastatin   40 mg Oral Daily      Subjective:   Brooke [name] was seen and examined today.  Feeling better, nausea vomiting is improving.  No acute abdominal pain, fever or chills, dizziness.    Objective:   Vitals:   04/15/24 0153 04/15/24 0406 04/15/24 0800 04/15/24 1217  BP: (!) 154/79 (!) 167/82  136/72  Pulse: 71 77  81  Resp: 18 18  16   Temp: 97.8 F (36.6 C) 97.8 F (36.6 C)  98.2 F (36.8 C)  TempSrc: Oral     SpO2: 98% 98% 98% 95%    Intake/Output Summary (Last 24 hours) at 04/15/2024 1501 Last data filed at 04/15/2024 0400 Gross per 24 hour  Intake  537.41 ml  Output --  Net 537.41 ml     Wt Readings from Last 3 Encounters:  03/30/24 81.8 kg  03/05/24 84.8 kg  02/26/24 86.7 kg     Exam General: Alert and oriented x 3, NAD Cardiovascular: S1 S2 auscultated,  RRR Respiratory: Clear to auscultation bilaterally, no wheezing Gastrointestinal: Soft, nontender, nondistended, + bowel sounds Ext: no pedal edema bilaterally Neuro: No new deficits Psych: Normal affect     Data Reviewed:  I have personally reviewed following labs    CBC Lab Results  Component Value Date   WBC 8.5 04/15/2024   RBC 3.95 04/15/2024   HGB 10.2 (L) 04/15/2024   HCT 32.6 (L) 04/15/2024   MCV 82.5 04/15/2024   MCH 25.8 (L) 04/15/2024   PLT 250 04/15/2024   MCHC 31.3 04/15/2024   RDW 17.8 (H) 04/15/2024   LYMPHSABS 1.3 02/26/2024   MONOABS 0.8 02/26/2024   EOSABS 0.1 02/26/2024   BASOSABS 0.1 02/26/2024     Last metabolic panel Lab Results  Component Value Date   NA 141 04/15/2024   K 3.0 (L) 04/15/2024   CL 111 04/15/2024   CO2 16 (L) 04/15/2024   BUN 16 04/15/2024   CREATININE 1.89 (H) 04/15/2024   GLUCOSE 97 04/15/2024   GFRNONAA 28 (L) 04/15/2024   GFRAA 39 (L) 09/24/2018   CALCIUM  8.4 (L) 04/15/2024   PHOS 3.4 06/21/2020   PROT 5.9 (L) 04/15/2024   ALBUMIN 3.1 (L) 04/15/2024   LABGLOB 2.4 10/27/2021   AGRATIO 1.7 10/27/2021   BILITOT 0.3 04/15/2024   ALKPHOS 121 04/15/2024   AST 22 04/15/2024   ALT 22 04/15/2024   ANIONGAP 14 04/15/2024    CBG (last 3)  Recent Labs    04/15/24 0724 04/15/24 1113  GLUCAP 125* 80      Coagulation Profile: No results for input(s): INR, PROTIME in the last 168 hours.   Radiology Studies: I have personally reviewed the imaging studies  CT ABDOMEN PELVIS WO CONTRAST Result Date: 04/14/2024 EXAM: CT ABDOMEN AND PELVIS WITHOUT CONTRAST 04/14/2024 11:29:31 PM TECHNIQUE: CT of the abdomen and pelvis was performed without the administration of intravenous contrast. Multiplanar  reformatted images are provided for review. Automated exposure control, iterative reconstruction, and/or weight-based adjustment of the mA/kV was utilized to reduce the radiation dose to as low as reasonably achievable. COMPARISON: 02/18/2024 CLINICAL HISTORY: Abdominal pain,  acute, nonlocalized. Emesis over the past 2 weeks. Reports unable to tolerate fluids. Reports emesis x2 today. No coffee emesis/ bright red blood. No fevers. No diarrhea. Does endorse dizziness with changing position - sts this has not changed since the beginning of this year. FINDINGS: LOWER CHEST: Patchy ground-glass opacities in the bilateral lower lobes, mildly progressive, favoring postinfectious inflammatory scarring versus atelectasis. LIVER: 2.0 cm right hepatic cyst (image 9), benign. GALLBLADDER AND BILE DUCTS: Status post cholecystectomy. No biliary ductal dilatation. SPLEEN: No acute abnormality. PANCREAS: No acute abnormality. ADRENAL GLANDS: No acute abnormality. KIDNEYS, URETERS AND BLADDER: No stones in the kidneys or ureters. No hydronephrosis. No perinephric or periureteral stranding. Urinary bladder is unremarkable. GI AND BOWEL: Stomach demonstrates no acute abnormality. There is no bowel obstruction. Normal appendix (image 54). Left colonic diverticulosis, without evidence of diverticulitis. Very mild pericolonic stranding along the ascending colon (image 32), suggesting mild infectious inflammatory colitis. PERITONEUM AND RETROPERITONEUM: No ascites. No free air. VASCULATURE: Aorta is normal in caliber. LYMPH NODES: No lymphadenopathy. REPRODUCTIVE ORGANS: Status post hysterectomy. BONES AND SOFT TISSUES: No acute osseous abnormality. 4.0 cm simple lipoma along the left lateral rectus muscle in the lower abdominal wall (image 62). IMPRESSION: 1. Suspected mild infectious inflammatory colitis involving the ascending colon. 2. Left colonic diverticulosis, without evidence of diverticulitis. Electronically signed by: Pinkie Pebbles MD 04/14/2024 11:41 PM EDT RP Workstation: [id number]       Nydia Distance M.D. [facility] 04/15/2024, 3:01 PM  Available via Epic secure chat 7am-7pm After 7 pm, please refer to night coverage provider listed on amion.

## 2024-04-15 NOTE — Plan of Care (Signed)

## 2024-04-15 NOTE — H&P (Addendum)
 History and Physical    Brooke [name] FMW:[id number] DOB: Nov 26, 1950 DOA: 04/14/2024  Patient coming from: Home.  Chief Complaint: Nausea vomiting.  HPI: Brooke [name] is a 73 y.o. female with history of diabetes mellitus type 2, hypertension, chronic kidney disease stage III, gout, COPD, GERD presents to the ER with complaints of nausea vomiting unable to eat anything for the last 1 month.  Patient also was admitted with weakness and poor oral intake on February 18, 2024.  Denies any abdominal pain or diarrhea.  Patient was started on Mounjaro  about 8 months ago and her dose was increased last month.  Denies any chest pain productive cough fever chills recent travel sick contacts.  ED Course: In the ER CTs abdomen pelvis shows ascending colon colitis like picture.  Patient is afebrile.  Labs show severe hypokalemia with acute on chronic kidney disease was started on potassium replacement and also IV fluids.  COVID-19 test is positive.  But patient is not hypoxic.  Admitted for further observation.  Review of Systems: As per HPI, rest all negative.   Past Medical History:  Diagnosis Date   Alopecia    Anxiety    Aortic atherosclerosis    Asthma    FOLLOWED BY PCP   Eczema    GERD (gastroesophageal reflux disease)    Gout    04-28-2018--- per pt stable , as been a while since last episode   Heart murmur    History of colon polyps    History of syncope 2015   Hyperlipidemia    Hypertension    Hypothyroidism    OA (osteoarthritis) of knee    bilateral   PVC's (premature ventricular contractions)    Toxic multinodular goiter    03/ 2003  s/p RAI   Type 2 diabetes mellitus (HCC)    followed by pcp   Ventricular tachycardia, polymorphic (HCC) 01/04/2014   primary cardiologist-- dr wilbert turner (hx monitor 2015 showed couplet PVCs, as trigger)   Wears glasses    Wears partial dentures    upper    Past Surgical History:  Procedure Laterality Date   ABDOMINAL HYSTERECTOMY   10/22/1999   WITH BSO   CATARACT EXTRACTION W/ INTRAOCULAR LENS IMPLANT Left YRS AGO   COLONOSCOPY     EXCISION ABDOMINAL WALL MASS  12-20-2005   dr merrilyn @[facility]    neruofibroma   LAPAROSCOPIC CHOLECYSTECTOMY  10/01/2002   dr merrilyn @[facility]    LEFT HEART CATHETERIZATION WITH CORONARY ANGIOGRAM N/A 01/07/2014   Procedure: LEFT HEART CATHETERIZATION WITH CORONARY ANGIOGRAM;  Surgeon: Peter M Swaziland, MD;  Location: [facility] CATH LAB;  Service: Cardiovascular;  Laterality: N/A;   RASTELLI PROCEDURE  6/98 neg   RENAL ARTERY STENT Left 11/2003   angioplasty and stenting   RENAL ARTERY STENT Left 02/2005   re-stenting   TOTAL KNEE ARTHROPLASTY Right 05/05/2018   Procedure: RIGHT TOTAL KNEE ARTHROPLASTY;  Surgeon: Liam Lerner, MD;  Location: [facility];  Service: Orthopedics;  Laterality: Right;   TOTAL KNEE ARTHROPLASTY Left 09/30/2018   Procedure: TOTAL KNEE ARTHROPLASTY;  Surgeon: Sheril Coy, MD;  Location: [facility];  Service: Orthopedics;  Laterality: Left;     reports that she has never smoked. She has never been exposed to tobacco smoke. She has never used smokeless tobacco. She reports that she does not drink alcohol and does not use drugs.  Allergies  Allergen Reactions   Metformin  And Related Nausea And Vomiting   Ace Inhibitors Swelling  Codeine Nausea And Vomiting and Rash   Hydrocodone  Nausea And Vomiting   Tramadol  Nausea And Vomiting   Tylenol  [Acetaminophen ] Itching and Rash    Allergic to prescription strength tylenol      Family History  Problem Relation Age of Onset   Diabetes Mother    Heart disease Father    Colon cancer Neg Hx    Esophageal cancer Neg Hx    Rectal cancer Neg Hx    Stomach cancer Neg Hx    BRCA 1/2 Neg Hx    Breast cancer Neg Hx     Prior to Admission medications   Medication Sig Start Date End Date Taking? Authorizing Provider  cetirizine  (ZYRTEC ) 10 MG tablet Take 10 mg by mouth daily. 04/03/24  Yes [provider]  ciprofloxacin  (CIPRO )  500 MG tablet Take 500 mg by mouth 2 (two) times daily. 03/24/24  Yes [provider]  telmisartan  (MICARDIS ) 80 MG tablet Take 80 mg by mouth daily. 03/05/24  Yes [provider]  acetaminophen  (TYLENOL ) 325 MG tablet Take 650 mg by mouth every 6 (six) hours as needed for mild pain.    Norleen Lynwood ORN, MD  acetaZOLAMIDE (DIAMOX) 250 MG tablet Take by mouth. 08/21/22   [provider]  albuterol  (VENTOLIN  HFA) 108 (90 Base) MCG/ACT inhaler Inhale 2 puffs into the lungs every 4 (four) hours as needed for wheezing or shortness of breath. 03/05/24   Jeneal Danita Macintosh, MD  allopurinol  (ZYLOPRIM ) 100 MG tablet TAKE 1 TABLET BY MOUTH EVERY DAY 07/08/23   Norleen Lynwood ORN, MD  atropine 1 % ophthalmic solution Place 1 drop into the left eye daily. 01/16/24   [provider]  Azelastine  HCl 137 MCG/SPRAY SOLN PLACE 2 SPRAYS INTO BOTH NOSTRILS 2 (TWO) TIMES DAILY. USE IN [facility] NOSTRIL AS DIRECTED 04/03/24   Norleen Lynwood ORN, MD  brimonidine  (ALPHAGAN ) 0.2 % ophthalmic solution 3 (three) times daily. 1 drop in the left eye three times daily Patient taking differently: Place 1 drop into the left eye 3 (three) times daily. 1 drop in the left eye three times daily   02/24/24: Reports during TOC call, taking BID, (NOT TID)    [provider]  citalopram  (CELEXA ) 40 MG tablet TAKE 1 TABLET BY MOUTH EVERY DAY 03/06/24   Norleen Lynwood ORN, MD  clonazePAM  (KLONOPIN ) 0.5 MG tablet Take 1 tablet (0.5 mg total) by mouth daily as needed (vertigo). 02/25/23   Norleen Lynwood ORN, MD  colchicine  0.6 MG tablet TAKE 0.5 TABLETS (0.3 MG TOTAL) BY MOUTH DAILY AS NEEDED (GOUT OR PSUEDOGOUT PAIN). 10/01/22   Corey, Evan S, MD  diltiazem  (CARDIZEM  CD) 240 MG 24 hr capsule Take 1 capsule (240 mg total) by mouth daily. 10/02/23   Norleen Lynwood ORN, MD  dorzolamide  (TRUSOPT ) 2 % ophthalmic solution Place 1 drop into the left eye 3 (three) times daily.    [provider]  famotidine  (PEPCID ) 20 MG tablet  Take 1 tablet (20 mg total) by mouth 2 (two) times daily. 03/05/24   Jeneal Danita Macintosh, MD  Fluticasone -Umeclidin-Vilant (TRELEGY ELLIPTA ) 200-62.5-25 MCG/ACT AEPB Inhale 1 puff into the lungs daily. 03/05/24   Jeneal Danita Macintosh, MD  JARDIANCE  25 MG TABS tablet TAKE 1 TABLET BY MOUTH EVERY DAY BEFORE BREAKFAST 11/26/23   Norleen Lynwood ORN, MD  latanoprost  (XALATAN ) 0.005 % ophthalmic solution Place 1 drop into the left eye at bedtime.    [provider]  levocetirizine (XYZAL ) 5 MG tablet Take 1 tablet (  5 mg total) by mouth daily. 03/05/24   Jeneal Danita Macintosh, MD  meclizine  (ANTIVERT ) 25 MG tablet TAKE 1 TABLET BY MOUTH 3 TIMES A DAY AS NEEDED FOR DIZZINESS 11/04/23   Norleen Lynwood ORN, MD  metoprolol  succinate (TOPROL -XL) 100 MG 24 hr tablet TAKE 1 TABLET BY MOUTH 2 (TWO) TIMES DAILY. TAKE WITH OR IMMEDIATELY FOLLOWING A MEAL. 04/03/24   Shlomo Wilbert SAUNDERS, MD  omeprazole  (PRILOSEC) 40 MG capsule Take 1 capsule (40 mg total) by mouth daily. 12/05/21   Norleen Lynwood ORN, MD  rosuvastatin  (CRESTOR ) 40 MG tablet TAKE 1 TABLET BY MOUTH EVERY DAY 04/03/23   Shlomo Wilbert SAUNDERS, MD  tirzepatide  (MOUNJARO ) 5 MG/0.5ML Pen Inject 5 mg into the skin once a week. 03/30/24   Norleen Lynwood ORN, MD  traZODone  (DESYREL ) 100 MG tablet TAKE 1 TABLET (100 MG TOTAL) BY MOUTH AT BEDTIME AS NEEDED. FOR SLEEP 03/03/24   Norleen Lynwood ORN, MD    Physical Exam: Constitutional: Moderately built and nourished. Vitals:   04/14/24 2300 04/15/24 0015 04/15/24 0153 04/15/24 0406  BP: 139/72  (!) 154/79 (!) 167/82  Pulse: 67 73 71 77  Resp: 17 19 18 18   Temp:   97.8 F (36.6 C) 97.8 F (36.6 C)  TempSrc:   Oral   SpO2: 100% 97% 98% 98%   Eyes: Anicteric no pallor. ENMT: No discharge from the ears eyes nose or mouth. Neck: No mass felt.  No neck rigidity. Respiratory: No rhonchi or crepitations. Cardiovascular: S1-S2 heard. Abdomen: Soft nontender bowel sound present. Musculoskeletal: No edema. Skin: No  rash. Neurologic: Alert awake oriented to time place and person.  Moves all extremities. Psychiatric: Appears normal.  Normal affect.   Labs on Admission: I have personally reviewed following labs and imaging studies  CBC: Recent Labs  Lab 04/14/24 2122  WBC 8.5  HGB 11.3*  HCT 35.6*  MCV 81.7  PLT 294   Basic Metabolic Panel: Recent Labs  Lab 04/14/24 2122  NA 139  K 2.6*  CL 109  CO2 17*  GLUCOSE 106*  BUN 16  CREATININE 2.10*  CALCIUM  9.5   GFR: CrCl cannot be calculated (Unknown ideal weight.). Liver Function Tests: Recent Labs  Lab 04/14/24 2122  AST 27  ALT 27  ALKPHOS 132*  BILITOT 0.3  PROT 6.9  ALBUMIN 3.5   Recent Labs  Lab 04/14/24 2122  LIPASE 12   No results for input(s): AMMONIA in the last 168 hours. Coagulation Profile: No results for input(s): INR, PROTIME in the last 168 hours. Cardiac Enzymes: No results for input(s): CKTOTAL, CKMB, CKMBINDEX, TROPONINI in the last 168 hours. BNP (last 3 results) No results for input(s): PROBNP in the last 8760 hours. HbA1C: No results for input(s): HGBA1C in the last 72 hours. CBG: No results for input(s): GLUCAP in the last 168 hours. Lipid Profile: No results for input(s): CHOL, HDL, LDLCALC, TRIG, CHOLHDL, LDLDIRECT in the last 72 hours. Thyroid  Function Tests: No results for input(s): TSH, T4TOTAL, FREET4, T3FREE, THYROIDAB in the last 72 hours. Anemia Panel: No results for input(s): VITAMINB12, FOLATE, FERRITIN, TIBC, IRON, RETICCTPCT in the last 72 hours. Urine analysis:    Component Value Date/Time   COLORURINE YELLOW 04/15/2024 0148   APPEARANCEUR HAZY (A) 04/15/2024 0148   LABSPEC 1.020 04/15/2024 0148   PHURINE 6.5 04/15/2024 0148   GLUCOSEU >=500 (A) 04/15/2024 0148   GLUCOSEU 500 (A) 09/06/2022 1011   HGBUR SMALL (A) 04/15/2024 0148   BILIRUBINUR NEGATIVE 04/15/2024 0148  BILIRUBINUR neg 09/12/2016 0954   KETONESUR  NEGATIVE 04/15/2024 0148   PROTEINUR 100 (A) 04/15/2024 0148   UROBILINOGEN 0.2 09/06/2022 1011   NITRITE NEGATIVE 04/15/2024 0148   LEUKOCYTESUR NEGATIVE 04/15/2024 0148   Sepsis Labs: @LABRCNTIP (procalcitonin:4,lacticidven:4) ) Recent Results (from the past 240 hours)  Resp panel by RT-PCR (RSV, Flu A&B, Covid) Anterior Nasal Swab     Status: Abnormal   Collection Time: 04/14/24 10:47 PM   Specimen: Anterior Nasal Swab  Result Value Ref Range Status   SARS Coronavirus 2 by RT PCR POSITIVE (A) NEGATIVE Final    Comment: (NOTE) SARS-CoV-2 target nucleic acids are DETECTED.  The SARS-CoV-2 RNA is generally detectable in upper respiratory specimens during the acute phase of infection. Positive results are indicative of the presence of the identified virus, but do not rule out bacterial infection or co-infection with other pathogens not detected by the test. Clinical correlation with patient history and other diagnostic information is necessary to determine patient infection status. The expected result is Negative.  Fact Sheet for Patients: BloggerCourse.com  Fact Sheet for Healthcare Providers: SeriousBroker.it  This test is not yet approved or cleared by the [place]  FDA and  has been authorized for detection and/or diagnosis of SARS-CoV-2 by FDA under an Emergency Use Authorization (EUA).  This EUA will remain in effect (meaning this test can be used) for the duration of  the COVID-19 declaration under Section 564(b)(1) of the A ct, 21 U.S.C. section 360bbb-3(b)(1), unless the authorization is terminated or revoked sooner.     Influenza A by PCR NEGATIVE NEGATIVE Final   Influenza B by PCR NEGATIVE NEGATIVE Final    Comment: (NOTE) The Xpert Xpress SARS-CoV-2/FLU/RSV plus assay is intended as an aid in the diagnosis of influenza from Nasopharyngeal swab specimens and should not be used as a sole basis for treatment.  Nasal washings and aspirates are unacceptable for Xpert Xpress SARS-CoV-2/FLU/RSV testing.  Fact Sheet for Patients: BloggerCourse.com  Fact Sheet for Healthcare Providers: SeriousBroker.it  This test is not yet approved or cleared by the [place]  FDA and has been authorized for detection and/or diagnosis of SARS-CoV-2 by FDA under an Emergency Use Authorization (EUA). This EUA will remain in effect (meaning this test can be used) for the duration of the COVID-19 declaration under Section 564(b)(1) of the Act, 21 U.S.C. section 360bbb-3(b)(1), unless the authorization is terminated or revoked.     Resp Syncytial Virus by PCR NEGATIVE NEGATIVE Final    Comment: (NOTE) Fact Sheet for Patients: BloggerCourse.com  Fact Sheet for Healthcare Providers: SeriousBroker.it  This test is not yet approved or cleared by the [place]  FDA and has been authorized for detection and/or diagnosis of SARS-CoV-2 by FDA under an Emergency Use Authorization (EUA). This EUA will remain in effect (meaning this test can be used) for the duration of the COVID-19 declaration under Section 564(b)(1) of the Act, 21 U.S.C. section 360bbb-3(b)(1), unless the authorization is terminated or revoked.  Performed at [facility], [address], [place], [place] [address]      Radiological Exams on Admission: CT ABDOMEN PELVIS WO CONTRAST Result Date: 04/14/2024 EXAM: CT ABDOMEN AND PELVIS WITHOUT CONTRAST 04/14/2024 11:29:31 PM TECHNIQUE: CT of the abdomen and pelvis was performed without the administration of intravenous contrast. Multiplanar reformatted images are provided for review. Automated exposure control, iterative reconstruction, and/or weight-based adjustment of the mA/kV was utilized to reduce the radiation dose to as low as reasonably achievable. COMPARISON: 02/18/2024  CLINICAL HISTORY: Abdominal  pain, acute, nonlocalized. Emesis over the past 2 weeks. Reports unable to tolerate fluids. Reports emesis x2 today. No coffee emesis/ bright red blood. No fevers. No diarrhea. Does endorse dizziness with changing position - sts this has not changed since the beginning of this year. FINDINGS: LOWER CHEST: Patchy ground-glass opacities in the bilateral lower lobes, mildly progressive, favoring postinfectious inflammatory scarring versus atelectasis. LIVER: 2.0 cm right hepatic cyst (image 9), benign. GALLBLADDER AND BILE DUCTS: Status post cholecystectomy. No biliary ductal dilatation. SPLEEN: No acute abnormality. PANCREAS: No acute abnormality. ADRENAL GLANDS: No acute abnormality. KIDNEYS, URETERS AND BLADDER: No stones in the kidneys or ureters. No hydronephrosis. No perinephric or periureteral stranding. Urinary bladder is unremarkable. GI AND BOWEL: Stomach demonstrates no acute abnormality. There is no bowel obstruction. Normal appendix (image 54). Left colonic diverticulosis, without evidence of diverticulitis. Very mild pericolonic stranding along the ascending colon (image 32), suggesting mild infectious inflammatory colitis. PERITONEUM AND RETROPERITONEUM: No ascites. No free air. VASCULATURE: Aorta is normal in caliber. LYMPH NODES: No lymphadenopathy. REPRODUCTIVE ORGANS: Status post hysterectomy. BONES AND SOFT TISSUES: No acute osseous abnormality. 4.0 cm simple lipoma along the left lateral rectus muscle in the lower abdominal wall (image 62). IMPRESSION: 1. Suspected mild infectious inflammatory colitis involving the ascending colon. 2. Left colonic diverticulosis, without evidence of diverticulitis. Electronically signed by: Pinkie Pebbles MD 04/14/2024 11:41 PM EDT RP Workstation: [id number]     Assessment/Plan Principal Problem:   Nausea & vomiting Active Problems:   Diabetes (HCC)   Hyperlipidemia   Gout   Essential hypertension   CKD (chronic kidney  disease) stage 3, GFR 30-59 ml/min (HCC)   ARF (acute renal failure)   COVID-19 virus infection    Intractable nausea vomiting cause not clear.  May be related to Mounjaro  which we will hold for now.  Advance diet as tolerated.  Continue hydration.  CT scan does show some ascending colitis but patient does not have any diarrhea or abdominal pain.  Will continue to observe. COVID-19 infection presently not hypoxic and patient not short of breath.  Will continue to closely observe.  Patient's nausea vomiting has been present for more than a month even before the COVID-19 infection. Acute on chronic disease stage III with hypokalemia likely due to poor oral intake and nausea vomiting.  Hold ARB.  Continue hydration replace potassium check magnesium . Hypertension continue metoprolol  Cardizem  hold ARB due to acute on chronic kidney disease. Diabetes mellitus type 2 takes Jardiance  and Mounjaro .  Hold Mounjaro  due to nausea vomiting.  Will keep patient on sliding scale coverage.  Last hemoglobin A1c was 6.2 a month ago. History of gout on allopurinol . COPD not actively wheezing continue Trelegy. Depression on Celexa  and trazodone . Hyperlipidemia on statins. Chronic anemia follow CBC. History of glaucoma continue eyedrops.  Since patient has intractable nausea vomiting with acute renal failure will need close monitoring further workup and more than 2 midnight stay.   DVT prophylaxis: Lovenox. Code Status: Full code. Family Communication: Discussed with patient. Disposition Plan: Medical floor. Consults called: None. Admission status: Observation.

## 2024-04-16 DIAGNOSIS — U071 COVID-19: Secondary | ICD-10-CM | POA: Diagnosis not present

## 2024-04-16 DIAGNOSIS — E0800 Diabetes mellitus due to underlying condition with hyperosmolarity without nonketotic hyperglycemic-hyperosmolar coma (NKHHC): Secondary | ICD-10-CM

## 2024-04-16 DIAGNOSIS — K529 Noninfective gastroenteritis and colitis, unspecified: Secondary | ICD-10-CM | POA: Diagnosis not present

## 2024-04-16 DIAGNOSIS — R1084 Generalized abdominal pain: Secondary | ICD-10-CM | POA: Diagnosis not present

## 2024-04-16 DIAGNOSIS — N179 Acute kidney failure, unspecified: Secondary | ICD-10-CM | POA: Diagnosis not present

## 2024-04-16 LAB — CBC
HCT: 29.8 % — ABNORMAL LOW (ref 36.0–46.0)
Hemoglobin: 9.4 g/dL — ABNORMAL LOW (ref 12.0–15.0)
MCH: 26.4 pg (ref 26.0–34.0)
MCHC: 31.5 g/dL (ref 30.0–36.0)
MCV: 83.7 fL (ref 80.0–100.0)
Platelets: 240 K/uL (ref 150–400)
RBC: 3.56 MIL/uL — ABNORMAL LOW (ref 3.87–5.11)
RDW: 18.2 % — ABNORMAL HIGH (ref 11.5–15.5)
WBC: 5.7 K/uL (ref 4.0–10.5)
nRBC: 0 % (ref 0.0–0.2)

## 2024-04-16 LAB — COMPREHENSIVE METABOLIC PANEL WITH GFR
ALT: 19 U/L (ref 0–44)
AST: 26 U/L (ref 15–41)
Albumin: 2.8 g/dL — ABNORMAL LOW (ref 3.5–5.0)
Alkaline Phosphatase: 109 U/L (ref 38–126)
Anion gap: 12 (ref 5–15)
BUN: 12 mg/dL (ref 8–23)
CO2: 16 mmol/L — ABNORMAL LOW (ref 22–32)
Calcium: 8.3 mg/dL — ABNORMAL LOW (ref 8.9–10.3)
Chloride: 113 mmol/L — ABNORMAL HIGH (ref 98–111)
Creatinine, Ser: 1.77 mg/dL — ABNORMAL HIGH (ref 0.44–1.00)
GFR, Estimated: 30 mL/min — ABNORMAL LOW (ref >60)
Glucose, Bld: 85 mg/dL (ref 70–99)
Potassium: 3.4 mmol/L — ABNORMAL LOW (ref 3.5–5.1)
Sodium: 141 mmol/L (ref 135–145)
Total Bilirubin: 0.2 mg/dL (ref 0.0–1.2)
Total Protein: 5.2 g/dL — ABNORMAL LOW (ref 6.5–8.1)

## 2024-04-16 LAB — GLUCOSE, CAPILLARY: Glucose-Capillary: 106 mg/dL — ABNORMAL HIGH (ref 70–99)

## 2024-04-16 MED ORDER — SODIUM BICARBONATE 650 MG PO TABS
650.0000 mg | ORAL_TABLET | Freq: Two times a day (BID) | ORAL | Status: DC
  Administered 2024-04-16: 650 mg via ORAL
  Filled 2024-04-16: qty 1

## 2024-04-16 MED ORDER — ONDANSETRON 4 MG PO TBDP: 4.0000 mg | ORAL_TABLET | Freq: Three times a day (TID) | ORAL | 0 refills | Status: DC | PRN

## 2024-04-16 MED ORDER — SODIUM BICARBONATE 650 MG PO TABS: 650.0000 mg | ORAL_TABLET | Freq: Two times a day (BID) | ORAL | 0 refills | Status: AC

## 2024-04-16 NOTE — Discharge Summary (Signed)
 Physician Discharge Summary   Patient: Brooke [name] MRN: [id number] DOB: May 10, 1951  Admit date:     04/14/2024  Discharge date: 04/16/24  Discharge Physician: Nydia Distance, MD    PCP: Norleen Lynwood ORN, MD   Recommendations at discharge:   Hold telmisartan , follow-up with PCP when to resume Patient recommended to keep the mask on for the next 5 days for isolation   Discharge Diagnoses:    Nausea & vomiting   Diabetes mellitus type II, NIDDM (HCC)   Hyperlipidemia   Gout   Essential hypertension   CKD (chronic kidney disease) stage 3b, GFR 30-59 ml/min (HCC)   ARF (acute renal failure)   COVID-19 virus infection     Hospital Course:  Patient is a 73 year old female with diabetes mellitus type 2, hypertension, chronic kidney disease stage III, gout, COPD, GERD presents to the ER with complaints of nausea vomiting unable to eat anything for the last 1 month.  Patient also was admitted with weakness and poor oral intake on February 18, 2024.  Denies any abdominal pain or diarrhea.  Patient was started on Mounjaro  about 8 months ago and her dose was increased last month.  Denies any chest pain productive cough fever chills recent travel sick contacts.   ED Course: In the ER CTs abdomen pelvis shows ascending colon colitis.  Afebrile.  Labs show severe hypokalemia with acute on chronic kidney disease was started on potassium replacement and also IV fluids.  COVID-19 test is positive.  No hypoxia.  Patient was admitted for further workup.    Assessment and Plan:   Nausea & vomiting, ascending colitis vs related to Mounjaro  --Patient was placed on IV fluid hydration - Patient is actually improving, no fevers, leukocytosis, any worsening abdominal pain or diarrhea.  No need to start antibiotics.  Tolerating diet. - Patient recommended to hold Mounjaro , takes it usually on Monday (already taken this week) and discuss with her PCP   COVID-19 infection  - No hypoxia or shortness of breath   - Placed on observation and no acute issues   Hypertension - Continue metoprolol , Cardizem    AKI on CKD stage IIIb - Baseline creatinine 1.4-1.5 presented with creatinine of 2.1 - Telmisartan  held, patient was placed on IV fluid hydration Creatinine 1.7 at discharge. Placed on sodium bicarb tabs 650 mg twice daily for 1 week, follow BMP at outpatient   Hypokalemia - Replaced     COPD -Stable, no active wheezing, continue Trelegy   Diabetes mellitus type 2, NIDDM - On Jardiance  and Mounjaro  - Last A1c 6.2 on 02/26/2024 -Inpatient placed on sliding scale insulin    Depression - Continue Celexa , trazodone    Chronic anemia, normocytic - Baseline hemoglobin 9-10, currently at baseline     Hyperlipidemia -On statin   History of glaucoma - Continue outpatient eyedrops   Obesity class I Estimated body mass index is 31.85 kg/m as calculated from the following:   Height as of 03/30/24: 5' 3.1 (1.603 m).   Weight as of 03/30/24: 81.8 kg.      Pain control - [place]  Controlled Substance Reporting System database was reviewed. and patient was instructed, not to drive, operate heavy machinery, perform activities at heights, swimming or participation in water  activities or provide baby-sitting services while on Pain, Sleep and Anxiety Medications; until their outpatient Physician has advised to do so again. Also recommended to not to take more than prescribed Pain, Sleep and Anxiety Medications.  Consultants:  Procedures performed:   Disposition: Home Diet  recommendation:  Discharge Diet Orders (From admission, onward)     Start     Ordered   04/16/24 0000  Diet Carb Modified        04/16/24 0915            DISCHARGE MEDICATION: Allergies as of 04/16/2024       Reactions   Metformin  And Related Nausea And Vomiting   Ace Inhibitors Swelling      Codeine Nausea And Vomiting, Rash   Hydrocodone  Nausea And Vomiting   Tramadol  Nausea And Vomiting   Tylenol   [acetaminophen ] Itching, Rash   Allergic to prescription strength tylenol          Medication List     PAUSE taking these medications    telmisartan  80 MG tablet Wait to take this until your doctor or other care provider tells you to start again. Commonly known as: MICARDIS  Take 80 mg by mouth daily.       TAKE these medications    acetaminophen  325 MG tablet Commonly known as: TYLENOL  Take 650 mg by mouth every 6 (six) hours as needed for mild pain.   albuterol  108 (90 Base) MCG/ACT inhaler Commonly known as: VENTOLIN  HFA Inhale 2 puffs into the lungs every 4 (four) hours as needed for wheezing or shortness of breath.   allopurinol  100 MG tablet Commonly known as: ZYLOPRIM  TAKE 1 TABLET BY MOUTH EVERY DAY   atropine 1 % ophthalmic solution Place 1 drop into the left eye daily.   Azelastine  HCl 137 MCG/SPRAY Soln PLACE 2 SPRAYS INTO BOTH NOSTRILS 2 (TWO) TIMES DAILY. USE IN EACH NOSTRIL AS DIRECTED What changed: See the new instructions.   brimonidine  0.2 % ophthalmic solution Commonly known as: ALPHAGAN  3 (three) times daily. 1 drop in the left eye three times daily What changed:  how much to take how to take this when to take this additional instructions   cetirizine  10 MG tablet Commonly known as: ZYRTEC  Take 10 mg by mouth daily.   citalopram  40 MG tablet Commonly known as: CELEXA  TAKE 1 TABLET BY MOUTH EVERY DAY   clonazePAM  0.5 MG tablet Commonly known as: KLONOPIN  Take 1 tablet (0.5 mg total) by mouth daily as needed (vertigo).   colchicine  0.6 MG tablet TAKE 0.5 TABLETS (0.3 MG TOTAL) BY MOUTH DAILY AS NEEDED (GOUT OR PSUEDOGOUT PAIN).   diltiazem  240 MG 24 hr capsule Commonly known as: CARDIZEM  CD Take 1 capsule (240 mg total) by mouth daily.   dorzolamide  2 % ophthalmic solution Commonly known as: TRUSOPT  Place 1 drop into the left eye 3 (three) times daily.   famotidine  20 MG tablet Commonly known as: Pepcid  Take 1 tablet (20 mg  total) by mouth 2 (two) times daily.   Jardiance  25 MG Tabs tablet Generic drug: empagliflozin  TAKE 1 TABLET BY MOUTH EVERY DAY BEFORE BREAKFAST What changed: See the new instructions.   latanoprost  0.005 % ophthalmic solution Commonly known as: XALATAN  Place 1 drop into the left eye at bedtime.   levocetirizine 5 MG tablet Commonly known as: XYZAL  Take 1 tablet (5 mg total) by mouth daily.   meclizine  25 MG tablet Commonly known as: ANTIVERT  TAKE 1 TABLET BY MOUTH 3 TIMES A DAY AS NEEDED FOR DIZZINESS What changed: See the new instructions.   metoprolol  succinate 100 MG 24 hr tablet Commonly known as: TOPROL -XL TAKE 1 TABLET BY MOUTH 2 (TWO) TIMES DAILY. TAKE WITH OR IMMEDIATELY FOLLOWING A MEAL. What changed: See the new instructions.   omeprazole   40 MG capsule Commonly known as: PRILOSEC Take 1 capsule (40 mg total) by mouth daily.   ondansetron  4 MG disintegrating tablet Commonly known as: ZOFRAN -ODT Take 1 tablet (4 mg total) by mouth every 8 (eight) hours as needed for nausea or vomiting.   rosuvastatin  40 MG tablet Commonly known as: CRESTOR  TAKE 1 TABLET BY MOUTH EVERY DAY   sodium bicarbonate  650 MG tablet Take 1 tablet (650 mg total) by mouth 2 (two) times daily for 7 days.   tirzepatide  5 MG/0.5ML Pen Commonly known as: MOUNJARO  Inject 5 mg into the skin once a week. What changed: additional instructions   traZODone  100 MG tablet Commonly known as: DESYREL  TAKE 1 TABLET (100 MG TOTAL) BY MOUTH AT BEDTIME AS NEEDED. FOR SLEEP What changed: when to take this   Trelegy Ellipta  200-62.5-25 MCG/ACT Aepb Generic drug: Fluticasone -Umeclidin-Vilant Inhale 1 puff into the lungs daily.        Follow-up Information     Norleen Lynwood ORN, MD. Schedule an appointment as soon as possible for a visit in 2 week(s).   Specialties: Internal Medicine, Radiology Why: for hospital follow-up Contact information: [address] [place]  [address] [phone]                Discharge Exam:  Feeling a lot better, dressed and ready to go.  Cleared for discharge home.  BP 128/64 (BP Location: Right Arm)   Pulse 80   Temp 98.9 F (37.2 C)   Resp 20   SpO2 94%   Physical Exam General: Alert and oriented x 3, NAD Cardiovascular: S1 S2 clear, RRR.  Respiratory: CTAB, no wheezing, rales Gastrointestinal: Soft, nontender, nondistended, NBS Ext: no pedal edema bilaterally Neuro: no new deficits Psych: Normal affect    Condition at discharge: fair  The results of significant diagnostics from this hospitalization (including imaging, microbiology, ancillary and laboratory) are listed below for reference.   Imaging Studies: CT ABDOMEN PELVIS WO CONTRAST Result Date: 04/14/2024 EXAM: CT ABDOMEN AND PELVIS WITHOUT CONTRAST 04/14/2024 11:29:31 PM TECHNIQUE: CT of the abdomen and pelvis was performed without the administration of intravenous contrast. Multiplanar reformatted images are provided for review. Automated exposure control, iterative reconstruction, and/or weight-based adjustment of the mA/kV was utilized to reduce the radiation dose to as low as reasonably achievable. COMPARISON: 02/18/2024 CLINICAL HISTORY: Abdominal pain, acute, nonlocalized. Emesis over the past 2 weeks. Reports unable to tolerate fluids. Reports emesis x2 today. No coffee emesis/ bright red blood. No fevers. No diarrhea. Does endorse dizziness with changing position - sts this has not changed since the beginning of this year. FINDINGS: LOWER CHEST: Patchy ground-glass opacities in the bilateral lower lobes, mildly progressive, favoring postinfectious inflammatory scarring versus atelectasis. LIVER: 2.0 cm right hepatic cyst (image 9), benign. GALLBLADDER AND BILE DUCTS: Status post cholecystectomy. No biliary ductal dilatation. SPLEEN: No acute abnormality. PANCREAS: No acute abnormality. ADRENAL GLANDS: No acute abnormality. KIDNEYS, URETERS AND  BLADDER: No stones in the kidneys or ureters. No hydronephrosis. No perinephric or periureteral stranding. Urinary bladder is unremarkable. GI AND BOWEL: Stomach demonstrates no acute abnormality. There is no bowel obstruction. Normal appendix (image 54). Left colonic diverticulosis, without evidence of diverticulitis. Very mild pericolonic stranding along the ascending colon (image 32), suggesting mild infectious inflammatory colitis. PERITONEUM AND RETROPERITONEUM: No ascites. No free air. VASCULATURE: Aorta is normal in caliber. LYMPH NODES: No lymphadenopathy. REPRODUCTIVE ORGANS: Status post hysterectomy. BONES AND SOFT TISSUES: No acute osseous abnormality. 4.0 cm simple lipoma along the left  lateral rectus muscle in the lower abdominal wall (image 62). IMPRESSION: 1. Suspected mild infectious inflammatory colitis involving the ascending colon. 2. Left colonic diverticulosis, without evidence of diverticulitis. Electronically signed by: Pinkie Pebbles MD 04/14/2024 11:41 PM EDT RP Workstation: [id number]   VAS US  CAROTID Result Date: 04/10/2024 Carotid Arterial Duplex Study Patient Name:  TEREZ MONTEE  Date of Exam:   04/10/2024 Medical Rec #: [id number]       Accession #:    [id number] Date of Birth: 10/26/1950       Patient Gender: F Patient Age:   65 years Exam Location:  [address] Procedure:      VAS US  CAROTID Referring Phys: LYNWOOD RUSH --------------------------------------------------------------------------------  Indications:       Carotid artery disease. Dizziness. History of syncope Limitations        Today's exam was technicallly difficult due to body habitus                    and due to the depth of arteries. Comparison Study:  No significant change since exam of 11/13/2013 Performing Technologist: Devere Dark RVT  Examination Guidelines: A complete evaluation includes B-mode imaging, spectral Doppler, color Doppler, and power Doppler as needed of all accessible portions of each vessel.  Bilateral testing is considered an integral part of a complete examination. Limited examinations for reoccurring indications may be performed as noted.  Right Carotid Findings: +----------+--------+--------+--------+------------------+--------+           PSV cm/sEDV cm/sStenosisPlaque DescriptionComments +----------+--------+--------+--------+------------------+--------+ CCA Prox  82      19                                         +----------+--------+--------+--------+------------------+--------+ CCA Mid   67      19                                         +----------+--------+--------+--------+------------------+--------+ CCA Distal49      15                                         +----------+--------+--------+--------+------------------+--------+ ICA Prox  58      17                                         +----------+--------+--------+--------+------------------+--------+ ICA Mid   56      18                                         +----------+--------+--------+--------+------------------+--------+ ICA Distal51      17                                         +----------+--------+--------+--------+------------------+--------+ ECA       68      6                                          +----------+--------+--------+--------+------------------+--------+ +----------+--------+-------+----------------+-------------------+  PSV cm/sEDV cmsDescribe        Arm Pressure (mmHG) +----------+--------+-------+----------------+-------------------+ [id number]     1      Multiphasic, [id number]                 +----------+--------+-------+----------------+-------------------+ +---------+--------+--+--------+--+---------+ VertebralPSV cm/s50EDV cm/s14Antegrade +---------+--------+--+--------+--+---------+  Left Carotid Findings: +----------+--------+--------+--------+------------------+--------+           PSV cm/sEDV cm/sStenosisPlaque  DescriptionComments +----------+--------+--------+--------+------------------+--------+ CCA Prox  89      17                                         +----------+--------+--------+--------+------------------+--------+ CCA Mid   68      13                                         +----------+--------+--------+--------+------------------+--------+ CCA Distal68      14                                         +----------+--------+--------+--------+------------------+--------+ ICA Prox  61      13                                         +----------+--------+--------+--------+------------------+--------+ ICA Mid   47      15                                         +----------+--------+--------+--------+------------------+--------+ ICA Distal72      26                                         +----------+--------+--------+--------+------------------+--------+ ECA       54      9                                          +----------+--------+--------+--------+------------------+--------+ +----------+--------+--------+----------------+-------------------+           PSV cm/sEDV cm/sDescribe        Arm Pressure (mmHG) +----------+--------+--------+----------------+-------------------+ Subclavian121     2       Multiphasic, [id number]                 +----------+--------+--------+----------------+-------------------+ +---------+--------+--+--------+--+---------+ VertebralPSV cm/s61EDV cm/s17Antegrade +---------+--------+--+--------+--+---------+   Summary: Right Carotid: There is no evidence of stenosis in the right ICA. Left Carotid: There is no evidence of stenosis in the left ICA. Vertebrals:  Bilateral vertebral arteries demonstrate antegrade flow. Subclavians: Normal flow hemodynamics were seen in bilateral subclavian              arteries. *See table(s) above for measurements and observations.  Electronically signed by Fonda Rim on 04/10/2024 at 3:39:39 PM.     Final     Microbiology: Results for orders placed or performed during the hospital encounter of 04/14/24  Resp panel by RT-PCR (RSV, Flu A&B, Covid) Anterior Nasal  Swab     Status: Abnormal   Collection Time: 04/14/24 10:47 PM   Specimen: Anterior Nasal Swab  Result Value Ref Range Status   SARS Coronavirus 2 by RT PCR POSITIVE (A) NEGATIVE Final    Comment: (NOTE) SARS-CoV-2 target nucleic acids are DETECTED.  The SARS-CoV-2 RNA is generally detectable in upper respiratory specimens during the acute phase of infection. Positive results are indicative of the presence of the identified virus, but do not rule out bacterial infection or co-infection with other pathogens not detected by the test. Clinical correlation with patient history and other diagnostic information is necessary to determine patient infection status. The expected result is Negative.  Fact Sheet for Patients: BloggerCourse.com  Fact Sheet for Healthcare Providers: SeriousBroker.it  This test is not yet approved or cleared by the [place]  FDA and  has been authorized for detection and/or diagnosis of SARS-CoV-2 by FDA under an Emergency Use Authorization (EUA).  This EUA will remain in effect (meaning this test can be used) for the duration of  the COVID-19 declaration under Section 564(b)(1) of the A ct, 21 U.S.C. section 360bbb-3(b)(1), unless the authorization is terminated or revoked sooner.     Influenza A by PCR NEGATIVE NEGATIVE Final   Influenza B by PCR NEGATIVE NEGATIVE Final    Comment: (NOTE) The Xpert Xpress SARS-CoV-2/FLU/RSV plus assay is intended as an aid in the diagnosis of influenza from Nasopharyngeal swab specimens and should not be used as a sole basis for treatment. Nasal washings and aspirates are unacceptable for Xpert Xpress SARS-CoV-2/FLU/RSV testing.  Fact Sheet for  Patients: BloggerCourse.com  Fact Sheet for Healthcare Providers: SeriousBroker.it  This test is not yet approved or cleared by the [place]  FDA and has been authorized for detection and/or diagnosis of SARS-CoV-2 by FDA under an Emergency Use Authorization (EUA). This EUA will remain in effect (meaning this test can be used) for the duration of the COVID-19 declaration under Section 564(b)(1) of the Act, 21 U.S.C. section 360bbb-3(b)(1), unless the authorization is terminated or revoked.     Resp Syncytial Virus by PCR NEGATIVE NEGATIVE Final    Comment: (NOTE) Fact Sheet for Patients: BloggerCourse.com  Fact Sheet for Healthcare Providers: SeriousBroker.it  This test is not yet approved or cleared by the [place]  FDA and has been authorized for detection and/or diagnosis of SARS-CoV-2 by FDA under an Emergency Use Authorization (EUA). This EUA will remain in effect (meaning this test can be used) for the duration of the COVID-19 declaration under Section 564(b)(1) of the Act, 21 U.S.C. section 360bbb-3(b)(1), unless the authorization is terminated or revoked.  Performed at [facility], [address] [place], [place] [address]     Labs: CBC: Recent Labs  Lab 04/14/24 2122 04/15/24 0542 04/16/24 0553  WBC 8.5 8.5 5.7  HGB 11.3* 10.2* 9.4*  HCT 35.6* 32.6* 29.8*  MCV 81.7 82.5 83.7  PLT 294 250 240   Basic Metabolic Panel: Recent Labs  Lab 04/14/24 2122 04/15/24 0542 04/15/24 1011 04/15/24 1521 04/16/24 0553  NA 139 141  --  138 141  K 2.6* 3.0*  --  3.0* 3.4*  CL 109 111  --  111 113*  CO2 17* 16*  --  16* 16*  GLUCOSE 106* 97  --  96 85  BUN 16 16  --  14 12  CREATININE 2.10* 1.89*  --  1.83* 1.77*  CALCIUM  9.5 8.4*  --  8.3* 8.3*  MG  --   --  2.4  --   --    Liver Function Tests: Recent Labs  Lab 04/14/24 2122  04/15/24 0542 04/16/24 0553  AST 27 22 26   ALT 27 22 19   ALKPHOS 132* 121 109  BILITOT 0.3 0.3 0.2  PROT 6.9 5.9* 5.2*  ALBUMIN 3.5 3.1* 2.8*   CBG: Recent Labs  Lab 04/15/24 0724 04/15/24 1113 04/15/24 1647 04/15/24 2140 04/16/24 0723  GLUCAP 125* 80 110* 98 106*    Discharge time spent: greater than 30 minutes.  Signed: Nydia Distance, MD [facility] 04/16/2024

## 2024-04-16 NOTE — TOC Initial Note (Signed)
 Transition of Care [facility] - Initial/Assessment Note    Patient Details  Name: Brooke [name] MRN: [id number] Date of Birth: 03-03-1951  Transition of Care [facility] CM/SW Contact:    Sonda Manuella Quill, RN Phone Number: 04/16/2024, 9:49 AM  Clinical Narrative:                 Spoke w/ pt in room; pt said she lives at home w/ her son and dtr-in-law; she plans to return at d/c; family will provide transportation; pt identified POC son Chinaza Rooke [phone]); she verified insurance/PCP; pt denied SDOH risks; she has walker; pt does not have HH services or home oxygen ; no TOC needs.  Expected Discharge Plan: Home/Self Care Barriers to Discharge: No Barriers Identified   Patient Goals and CMS Choice Patient states their goals for this hospitalization and ongoing recovery are:: home CMS Medicare.gov Compare Post Acute Care list provided to:: Patient        Expected Discharge Plan and Services   Discharge Planning Services: CM Consult   Living arrangements for the past 2 months: Single Family Home Expected Discharge Date: 04/16/24               DME Arranged: N/A DME Agency: NA       HH Arranged: NA HH Agency: NA        Prior Living Arrangements/Services Living arrangements for the past 2 months: Single Family Home Lives with:: Adult Children Patient language and need for interpreter reviewed:: Yes Do you feel safe going back to the place where you live?: Yes      Need for Family Participation in Patient Care: Yes (Comment) Care giver support system in place?: Yes (comment) Current home services: DME (walker) Criminal Activity/Legal Involvement Pertinent to Current Situation/Hospitalization: No - Comment as needed  Activities of Daily Living   ADL Screening (condition at time of admission) Independently performs ADLs?: Yes (appropriate for developmental age) Is the patient deaf or have difficulty hearing?: No Does the patient have difficulty seeing, even when  wearing glasses/contacts?: No Does the patient have difficulty concentrating, remembering, or making decisions?: No  Permission Sought/Granted Permission sought to share information with : Case Manager Permission granted to share information with : Yes, Verbal Permission Granted  Share Information with NAME: Case Manager     Permission granted to share info w Relationship: Grasiela Jonsson (son) [phone]     Emotional Assessment Appearance:: Appears stated age Attitude/Demeanor/Rapport: Gracious Affect (typically observed): Accepting Orientation: : Oriented to Self, Oriented to Place, Oriented to  Time, Oriented to Situation Alcohol / Substance Use: Not Applicable Psych Involvement: No (comment)  Admission diagnosis:  Hypokalemia [E87.6] Colitis [K52.9] Generalized abdominal pain [R10.84] Nausea & vomiting [R11.2] AKI (acute kidney injury) [N17.9] COVID [U07.1] Nausea and vomiting [R11.2] Patient Active Problem List   Diagnosis Date Noted   Nausea & vomiting 04/15/2024   COVID-19 virus infection 04/15/2024   Nausea and vomiting 04/15/2024   Dizziness 04/03/2024   Pneumonia 02/27/2024   UTI (urinary tract infection) 02/27/2024   Pancreatic lesion 02/27/2024   Cerumen impaction 02/27/2024   AKI (acute kidney injury) 02/20/2024   ARF (acute renal failure) 02/18/2024   Scalp pain 06/08/2023   Significant closed head trauma within past 3 months 02/25/2023   Chronic cough 02/25/2023   COPD (chronic obstructive pulmonary disease) (HCC) 09/08/2022   Pseudogout of left wrist 08/08/2022   Generalized weakness 05/18/2022   Left ear impacted cerumen 05/16/2022   Left kidney mass 01/14/2022   Peripheral edema  01/14/2022   Left ear hearing loss 12/05/2021   Nasal crusting 12/04/2021   Left ear pain 08/29/2021   Grief 06/05/2021   Acute shoulder bursitis, left 06/05/2021   Bilateral shoulder pain 03/05/2020   Vitamin D  deficiency 12/01/2019   Allergic rhinitis 06/16/2019    Asthma exacerbation 06/16/2019   Dyspnea 01/27/2019   Primary osteoarthritis of left knee 09/30/2018   CKD (chronic kidney disease) stage 3, GFR 30-59 ml/min (HCC) 07/25/2018   Chronic back pain 07/25/2018   Primary osteoarthritis of right knee 05/05/2018   Osteoarthritis of right knee 04/30/2018   Degenerative arthritis of knee, bilateral 08/07/2017   Depression with anxiety 07/19/2017   Vertigo 01/12/2017   Low back pain 01/12/2017   SOB (shortness of breath) 12/02/2016   Dysuria 09/12/2016   Bilateral knee pain 02/15/2015   Effusion of right knee 01/28/2015   Asthma with exacerbation 11/09/2014   Pronation deformity of both feet 09/29/2014   Greater trochanteric bursitis of left hip 08/31/2014   Degenerative arthritis of left knee 05/18/2014   Ventricular tachycardia, polymorphic (HCC) 01/04/2014   Abnormal MRI of head 12/04/2013   Syncope 11/06/2013   Chest pain 11/06/2013   Headache 11/06/2013   Nausea with vomiting 11/06/2013   Hypokalemia 08/18/2012   Diastolic dysfunction 01/15/2011   Leukocytosis 01/03/2011   Toe pain 12/20/2010   Encounter for well adult exam with abnormal findings 12/19/2010   FATIGUE 09/05/2009   Diabetes (HCC) 11/29/2008   Gout 08/09/2008   Normochromic normocytic anemia 02/09/2008   GERD 02/09/2008   ALOPECIA 12/30/2007   Hyperlipidemia 10/01/2007   Essential hypertension 02/26/2007   History of colonic polyps 02/26/2007   Goiter 09/19/2006   Asthma 09/19/2006   ECZEMA, ATOPIC DERMATITIS 09/19/2006   Insomnia 09/19/2006   PCP:  Norleen Lynwood ORN, MD Pharmacy:   [phone] [place] [place] - [address] AT [facility] [address] [place] [address] Phone: [phone] Fax: [phone]  [id number] [place], [place] - [address] AT [facility] [address] [place] [address] Phone: [phone] Fax: [phone]     Social Drivers of Health  (SDOH) Social History: SDOH Screenings   Food Insecurity: No Food Insecurity (04/16/2024)  Housing: Low Risk  (04/16/2024)  Transportation Needs: No Transportation Needs (04/16/2024)  Utilities: Not At Risk (04/16/2024)  Alcohol Screen: Low Risk  (09/24/2023)  Depression (PHQ2-9): Low Risk  (03/30/2024)  Financial Resource Strain: Low Risk  (09/24/2023)  Physical Activity: Insufficiently Active (09/24/2023)  Social Connections: Moderately Isolated (04/15/2024)  Stress: No Stress Concern Present (09/24/2023)  Tobacco Use: Low Risk  (04/15/2024)  Health Literacy: Adequate Health Literacy (09/24/2023)   SDOH Interventions: Food Insecurity Interventions: Intervention Not Indicated, Inpatient TOC Housing Interventions: Intervention Not Indicated, Inpatient TOC Transportation Interventions: Intervention Not Indicated, Inpatient TOC Utilities Interventions: Intervention Not Indicated, Inpatient TOC   Readmission Risk Interventions    04/16/2024    9:45 AM  Readmission Risk Prevention Plan  Transportation Screening Complete  PCP or Specialist Appt within 3-5 Days Complete  HRI or Home Care Consult Complete  Social Work Consult for Recovery Care Planning/Counseling Complete  Palliative Care Screening Not Applicable  Medication Review Oceanographer) Complete

## 2024-04-17 ENCOUNTER — Telehealth: Payer: Self-pay

## 2024-04-17 NOTE — Transitions of Care (Post Inpatient/ED Visit) (Signed)
   04/17/2024  Name: Brooke [name] MRN: [id number] DOB: October 16, 1950  Today's TOC FU Call Status: Today's TOC FU Call Status:: Unsuccessful Call (2nd Attempt) Unsuccessful Call (2nd Attempt) Date: 04/17/24  Attempted to reach the patient regarding the most recent Inpatient/ED visit.  Follow Up Plan: Additional outreach attempts will be made to reach the patient to complete the Transitions of Care (Post Inpatient/ED visit) call.   Shona Prow RN, CCM [facility]  VBCI-Population Health RN Care Manager [phone]

## 2024-04-17 NOTE — Transitions of Care (Post Inpatient/ED Visit) (Signed)
   04/17/2024  Name: Brooke [name] MRN: [id number] DOB: Jun 04, 1951  Today's TOC FU Call Status: Today's TOC FU Call Status:: Unsuccessful Call (1st Attempt) Unsuccessful Call (1st Attempt) Date: 04/17/24  Attempted to reach the patient regarding the most recent Inpatient/ED visit.  Follow Up Plan: Additional outreach attempts will be made to reach the patient to complete the Transitions of Care (Post Inpatient/ED visit) call.   Shona Prow RN, CCM [facility]  VBCI-Population Health RN Care Manager [phone]

## 2024-04-20 NOTE — Telephone Encounter (Signed)
 Called and spoke with patient. Informed her of results and recommendation from PCP and she expressed understanding

## 2024-04-21 ENCOUNTER — Telehealth: Payer: Self-pay | Admitting: *Deleted

## 2024-04-21 NOTE — Transitions of Care (Post Inpatient/ED Visit) (Signed)
   04/21/2024  Name: Brooke [name] MRN: [id number] DOB: [birth year]  Today's TOC FU Call Status: Today's TOC FU Call Status:: Unsuccessful Call (3rd Attempt) Unsuccessful Call (3rd Attempt) Date: 04/21/24  Attempted to reach the patient regarding the most recent Inpatient/ED visit.  Follow Up Plan: Additional outreach attempts will be made to reach the patient to complete the Transitions of Care (Post Inpatient/ED visit) call.   Cathlean Headland BSN RN [facility] Health Care Management Coordinator [email or web address] Direct Dial: [phone]  Fax: [phone] Website: [email or web address]

## 2024-04-22 ENCOUNTER — Ambulatory Visit: Admitting: Internal Medicine

## 2024-04-22 ENCOUNTER — Other Ambulatory Visit: Payer: Self-pay | Admitting: Cardiology

## 2024-04-22 ENCOUNTER — Encounter: Payer: Self-pay | Admitting: Internal Medicine

## 2024-04-22 VITALS — BP 136/70 | HR 73 | Temp 98.3°F | Ht 63.1 in | Wt 178.0 lb

## 2024-04-22 DIAGNOSIS — Z7984 Long term (current) use of oral hypoglycemic drugs: Secondary | ICD-10-CM | POA: Diagnosis not present

## 2024-04-22 DIAGNOSIS — N1831 Chronic kidney disease, stage 3a: Secondary | ICD-10-CM

## 2024-04-22 DIAGNOSIS — Z7985 Long-term (current) use of injectable non-insulin antidiabetic drugs: Secondary | ICD-10-CM

## 2024-04-22 DIAGNOSIS — E1122 Type 2 diabetes mellitus with diabetic chronic kidney disease: Secondary | ICD-10-CM | POA: Diagnosis not present

## 2024-04-22 DIAGNOSIS — E559 Vitamin D deficiency, unspecified: Secondary | ICD-10-CM

## 2024-04-22 DIAGNOSIS — N179 Acute kidney failure, unspecified: Secondary | ICD-10-CM

## 2024-04-22 DIAGNOSIS — I10 Essential (primary) hypertension: Secondary | ICD-10-CM | POA: Diagnosis not present

## 2024-04-22 NOTE — Progress Notes (Signed)
 Patient ID: Brooke [name], female   DOB: 1950-10-04, 73 y.o.   MRN: [id number]        Chief Complaint: follow up post hospn sept 23 - 25 2025 with covid infection, n/v, htn, aki on ckd3a       HPI:  Brooke [name] is a 73 y.o. female here overall doing ok without new complaints; was recently hospd with n/v dehydration and AKI on Ckd3a in the setting of mounjaro  vs other colitis.  Telmisartan  held at dc and pt reports dizziness weakness improved.  Pt does not want to restart.  Needs f/u BMP today.  Denies worsening reflux, abd pain, dysphagia, n/v, bowel change or blood.  Pt denies chest pain, increased sob or doe, wheezing, orthopnea, PND, increased LE swelling, palpitations, dizziness or syncope.   Pt denies polydipsia, polyuria, or new focal neuro s/s.  Has no specific need for specialist f/u at this time   no falls recently, has rollater she uses all the time       Wt Readings from Last 3 Encounters:  04/22/24 178 lb (80.7 kg)  03/30/24 180 lb 6 oz (81.8 kg)  03/05/24 186 lb 14.4 oz (84.8 kg)   BP Readings from Last 3 Encounters:  04/22/24 136/70  04/16/24 128/64  03/30/24 132/68         Past Medical History:  Diagnosis Date   Alopecia    Anxiety    Aortic atherosclerosis    Asthma    FOLLOWED BY PCP   Eczema    GERD (gastroesophageal reflux disease)    Gout    04-28-2018--- per pt stable , as been a while since last episode   Heart murmur    History of colon polyps    History of syncope 2015   Hyperlipidemia    Hypertension    Hypothyroidism    OA (osteoarthritis) of knee    bilateral   PVC's (premature ventricular contractions)    Toxic multinodular goiter    03/ 2003  s/p RAI   Type 2 diabetes mellitus (HCC)    followed by pcp   Ventricular tachycardia, polymorphic (HCC) 01/04/2014   primary cardiologist-- dr wilbert turner (hx monitor 2015 showed couplet PVCs, as trigger)   Wears glasses    Wears partial dentures    upper   Past Surgical History:  Procedure  Laterality Date   ABDOMINAL HYSTERECTOMY  10/22/1999   WITH BSO   CATARACT EXTRACTION W/ INTRAOCULAR LENS IMPLANT Left YRS AGO   COLONOSCOPY     EXCISION ABDOMINAL WALL MASS  12-20-2005   dr merrilyn @[facility]    neruofibroma   LAPAROSCOPIC CHOLECYSTECTOMY  10/01/2002   dr merrilyn @[facility]    LEFT HEART CATHETERIZATION WITH CORONARY ANGIOGRAM N/A 01/07/2014   Procedure: LEFT HEART CATHETERIZATION WITH CORONARY ANGIOGRAM;  Surgeon: Peter M Swaziland, MD;  Location: [facility] CATH LAB;  Service: Cardiovascular;  Laterality: N/A;   RASTELLI PROCEDURE  6/98 neg   RENAL ARTERY STENT Left 11/2003   angioplasty and stenting   RENAL ARTERY STENT Left 02/2005   re-stenting   TOTAL KNEE ARTHROPLASTY Right 05/05/2018   Procedure: RIGHT TOTAL KNEE ARTHROPLASTY;  Surgeon: Liam Lerner, MD;  Location: [facility] ORS;  Service: Orthopedics;  Laterality: Right;   TOTAL KNEE ARTHROPLASTY Left 09/30/2018   Procedure: TOTAL KNEE ARTHROPLASTY;  Surgeon: Sheril Coy, MD;  Location: [facility];  Service: Orthopedics;  Laterality: Left;    reports that she has never smoked. She has never been exposed to tobacco smoke. She has  never used smokeless tobacco. She reports that she does not drink alcohol and does not use drugs. family history includes Diabetes in her mother; Heart disease in her father. Allergies  Allergen Reactions   Metformin  And Related Nausea And Vomiting   Ace Inhibitors Swelling        Codeine Nausea And Vomiting and Rash   Hydrocodone  Nausea And Vomiting   Tramadol  Nausea And Vomiting   Tylenol  [Acetaminophen ] Itching and Rash    Allergic to prescription strength tylenol     Current Outpatient Medications on File Prior to Visit  Medication Sig Dispense Refill   acetaminophen  (TYLENOL ) 325 MG tablet Take 650 mg by mouth every 6 (six) hours as needed for mild pain.     albuterol  (VENTOLIN  HFA) 108 (90 Base) MCG/ACT inhaler Inhale 2 puffs into the lungs every 4 (four) hours as needed for wheezing or shortness of breath.  18 g 1   allopurinol  (ZYLOPRIM ) 100 MG tablet TAKE 1 TABLET BY MOUTH EVERY DAY 90 tablet 3   atropine 1 % ophthalmic solution Place 1 drop into the left eye daily.     Azelastine  HCl 137 MCG/SPRAY SOLN PLACE 2 SPRAYS INTO BOTH NOSTRILS 2 (TWO) TIMES DAILY. USE IN EACH NOSTRIL AS DIRECTED (Patient taking differently: Place 2 sprays into the nose in the morning and at bedtime.) 90 mL 4   brimonidine  (ALPHAGAN ) 0.2 % ophthalmic solution 3 (three) times daily. 1 drop in the left eye three times daily (Patient taking differently: Place 1 drop into the left eye in the morning and at bedtime.)     cetirizine  (ZYRTEC ) 10 MG tablet Take 10 mg by mouth daily.     citalopram  (CELEXA ) 40 MG tablet TAKE 1 TABLET BY MOUTH EVERY DAY 90 tablet 3   clonazePAM  (KLONOPIN ) 0.5 MG tablet Take 1 tablet (0.5 mg total) by mouth daily as needed (vertigo). 90 tablet 1   colchicine  0.6 MG tablet TAKE 0.5 TABLETS (0.3 MG TOTAL) BY MOUTH DAILY AS NEEDED (GOUT OR PSUEDOGOUT PAIN). 45 tablet 1   diltiazem  (CARDIZEM  CD) 240 MG 24 hr capsule Take 1 capsule (240 mg total) by mouth daily. 90 capsule 3   dorzolamide  (TRUSOPT ) 2 % ophthalmic solution Place 1 drop into the left eye 3 (three) times daily.     famotidine  (PEPCID ) 20 MG tablet Take 1 tablet (20 mg total) by mouth 2 (two) times daily. 60 tablet 5   Fluticasone -Umeclidin-Vilant (TRELEGY ELLIPTA ) 200-62.5-25 MCG/ACT AEPB Inhale 1 puff into the lungs daily. 28 each 5   JARDIANCE  25 MG TABS tablet TAKE 1 TABLET BY MOUTH EVERY DAY BEFORE BREAKFAST (Patient taking differently: Take 25 mg by mouth daily.) 90 tablet 3   latanoprost  (XALATAN ) 0.005 % ophthalmic solution Place 1 drop into the left eye at bedtime.     levocetirizine (XYZAL ) 5 MG tablet Take 1 tablet (5 mg total) by mouth daily. 30 tablet 5   meclizine  (ANTIVERT ) 25 MG tablet TAKE 1 TABLET BY MOUTH 3 TIMES A DAY AS NEEDED FOR DIZZINESS (Patient taking differently: Take 25 mg by mouth 3 (three) times daily as needed for  dizziness. TAKE 1 TABLET BY MOUTH 3 TIMES A DAY AS NEEDED FOR DIZZINESS) 90 tablet 1   metoprolol  succinate (TOPROL -XL) 100 MG 24 hr tablet TAKE 1 TABLET BY MOUTH 2 (TWO) TIMES DAILY. TAKE WITH OR IMMEDIATELY FOLLOWING A MEAL. (Patient taking differently: Take 100 mg by mouth daily.) 180 tablet 0   omeprazole  (PRILOSEC) 40 MG capsule Take 1 capsule (40  mg total) by mouth daily. 90 capsule 3   ondansetron  (ZOFRAN -ODT) 4 MG disintegrating tablet Take 1 tablet (4 mg total) by mouth every 8 (eight) hours as needed for nausea or vomiting. 30 tablet 0   rosuvastatin  (CRESTOR ) 40 MG tablet TAKE 1 TABLET BY MOUTH EVERY DAY 90 tablet 3   sodium bicarbonate  650 MG tablet Take 1 tablet (650 mg total) by mouth 2 (two) times daily for 7 days. 14 tablet 0   tirzepatide  (MOUNJARO ) 5 MG/0.5ML Pen Inject 5 mg into the skin once a week. (Patient taking differently: Inject 5 mg into the skin once a week. Mondays) 6 mL 3   traZODone  (DESYREL ) 100 MG tablet TAKE 1 TABLET (100 MG TOTAL) BY MOUTH AT BEDTIME AS NEEDED. FOR SLEEP (Patient taking differently: Take 100 mg by mouth at bedtime. for sleep) 90 tablet 1   No current facility-administered medications on file prior to visit.        ROS:  All others reviewed and negative.  Objective        PE:  BP 136/70 (BP Location: Left Arm, Patient Position: Sitting, Cuff Size: Normal)   Pulse 73   Temp 98.3 F (36.8 C) (Oral)   Ht 5' 3.1 (1.603 m)   Wt 178 lb (80.7 kg)   SpO2 98%   BMI 31.43 kg/m                 Constitutional: Pt appears in NAD               HENT: Head: NCAT.                Right Ear: External ear normal.                 Left Ear: External ear normal.                Eyes: . Pupils are equal, round, and reactive to light. Conjunctivae and EOM are normal               Nose: without d/c or deformity               Neck: Neck supple. Gross normal ROM               Cardiovascular: Normal rate and regular rhythm.                 Pulmonary/Chest:  Effort normal and breath sounds without rales or wheezing.                Abd:  Soft, NT, ND, + BS, no organomegaly               Neurological: Pt is alert. At baseline orientation, motor grossly intact               Skin: Skin is warm. No rashes, no other new lesions, LE edema - none               Psychiatric: Pt behavior is normal without agitation   Micro: none  Cardiac tracings I have personally interpreted today:  none  Pertinent Radiological findings (summarize): none   Lab Results  Component Value Date   WBC 5.7 04/16/2024   HGB 9.4 (L) 04/16/2024   HCT 29.8 (L) 04/16/2024   PLT 240 04/16/2024   GLUCOSE 85 04/16/2024   CHOL 128 10/02/2023   TRIG 91.0 10/02/2023   HDL 49.40 10/02/2023   LDLDIRECT 139.1 09/05/2009   LDLCALC  61 10/02/2023   ALT 19 04/16/2024   AST 26 04/16/2024   NA 141 04/16/2024   K 3.4 (L) 04/16/2024   CL 113 (H) 04/16/2024   CREATININE 1.77 (H) 04/16/2024   BUN 12 04/16/2024   CO2 16 (L) 04/16/2024   TSH 1.044 02/18/2024   INR 1.0 09/24/2018   HGBA1C 6.2 02/26/2024   MICROALBUR 1.1 10/02/2023   Assessment/Plan:  Brooke [name] is a 73 y.o. Black or African American [2] female with  has a past medical history of Alopecia, Anxiety, Aortic atherosclerosis, Asthma, Eczema, GERD (gastroesophageal reflux disease), Gout, Heart murmur, History of colon polyps, History of syncope (2015), Hyperlipidemia, Hypertension, Hypothyroidism, OA (osteoarthritis) of knee, PVC's (premature ventricular contractions), Toxic multinodular goiter, Type 2 diabetes mellitus (HCC), Ventricular tachycardia, polymorphic (HCC) (01/04/2014), Wears glasses, and Wears partial dentures.  AKI (acute kidney injury) With recent worsening improved at d/c with IVF and holding telmisartan   Lab Results  Component Value Date   CREATININE 1.77 (H) 04/16/2024   Pt for f/u lab today  Diabetes mellitus with chronic kidney disease (HCC) Lab Results  Component Value Date   HGBA1C 6.2  02/26/2024  With CKD3a Stable, pt to continue current medical treatment jardiance  25 every day, mounjaro  5 mg weekly   Essential hypertension BP Readings from Last 3 Encounters:  04/22/24 136/70  04/16/24 128/64  03/30/24 132/68   Uncontrolled mildly,  pt to continue medical treatment cardizem  CD 240 every day, toprol  xl 100 mg bid as pt declines to restart even lower dose of ARB for now   Vitamin D  deficiency Last vitamin D  Lab Results  Component Value Date   VD25OH 29.65 (L) 10/02/2023   Low, to start oral replacement  Followup: Return in about 5 months (around 09/28/2024).  Lynwood Rush, MD 04/22/2024 9:42 AM [facility]

## 2024-04-22 NOTE — Assessment & Plan Note (Signed)
 With recent worsening improved at d/c with IVF and holding telmisartan   Lab Results  Component Value Date   CREATININE 1.77 (H) 04/16/2024   Pt for f/u lab today

## 2024-04-22 NOTE — Assessment & Plan Note (Signed)
 Lab Results  Component Value Date   HGBA1C 6.2 02/26/2024  With CKD3a Stable, pt to continue current medical treatment jardiance  25 every day, mounjaro  5 mg weekly

## 2024-04-22 NOTE — Assessment & Plan Note (Signed)
 BP Readings from Last 3 Encounters:  04/22/24 136/70  04/16/24 128/64  03/30/24 132/68   Uncontrolled mildly,  pt to continue medical treatment cardizem  CD 240 every day, toprol  xl 100 mg bid as pt declines to restart even lower dose of ARB for now

## 2024-04-22 NOTE — Patient Instructions (Addendum)
 Ok to stay off the telmisartan  as you are doing now  Please continue all other medications as before, and refills have been done if requested.  Please have the pharmacy call with any other refills you may need.  Please keep your appointments with your specialists as you may have planned  Please go to the LAB at the blood drawing area for the tests to be done  You will be contacted by phone if any changes need to be made immediately.  Otherwise, you will receive a letter about your results with an explanation, but please check with MyChart first.  Please make an Appointment to return in Sep 28 2024, or sooner if needed

## 2024-04-22 NOTE — Assessment & Plan Note (Signed)
 Last vitamin D Lab Results  Component Value Date   VD25OH 29.65 (L) 10/02/2023   Low, to start oral replacement

## 2024-04-23 ENCOUNTER — Telehealth: Payer: Self-pay

## 2024-04-23 ENCOUNTER — Emergency Department (HOSPITAL_COMMUNITY)
Admission: EM | Admit: 2024-04-23 | Discharge: 2024-04-23 | Disposition: A | Discharge status: Home / Self Care | Attending: Emergency Medicine | Admitting: Emergency Medicine

## 2024-04-23 ENCOUNTER — Ambulatory Visit: Payer: Self-pay | Admitting: Internal Medicine

## 2024-04-23 ENCOUNTER — Other Ambulatory Visit: Payer: Self-pay | Admitting: Internal Medicine

## 2024-04-23 DIAGNOSIS — Z743 Need for continuous supervision: Secondary | ICD-10-CM | POA: Diagnosis not present

## 2024-04-23 DIAGNOSIS — E876 Hypokalemia: Secondary | ICD-10-CM | POA: Insufficient documentation

## 2024-04-23 DIAGNOSIS — R799 Abnormal finding of blood chemistry, unspecified: Secondary | ICD-10-CM | POA: Diagnosis present

## 2024-04-23 DIAGNOSIS — R404 Transient alteration of awareness: Secondary | ICD-10-CM | POA: Diagnosis not present

## 2024-04-23 LAB — BASIC METABOLIC PANEL WITH GFR
Anion gap: 13 (ref 5–15)
BUN: 14 mg/dL (ref 6–23)
BUN: 15 mg/dL (ref 8–23)
CO2: 24 meq/L (ref 19–32)
CO2: 20 mmol/L — ABNORMAL LOW (ref 22–32)
Calcium: 9 mg/dL (ref 8.4–10.5)
Calcium: 8.8 mg/dL — ABNORMAL LOW (ref 8.9–10.3)
Chloride: 109 meq/L (ref 96–112)
Chloride: 111 mmol/L (ref 98–111)
Creatinine, Ser: 1.73 mg/dL — ABNORMAL HIGH (ref 0.40–1.20)
Creatinine, Ser: 1.47 mg/dL — ABNORMAL HIGH (ref 0.44–1.00)
GFR, Estimated: 38 mL/min — ABNORMAL LOW (ref >60)
GFR: 29.2 mL/min — ABNORMAL LOW (ref >60.00)
Glucose, Bld: 90 mg/dL (ref 70–99)
Glucose, Bld: 98 mg/dL (ref 70–99)
Potassium: 2.8 meq/L — CL (ref 3.5–5.1)
Potassium: 2.6 mmol/L — CL (ref 3.5–5.1)
Sodium: 142 meq/L (ref 135–145)
Sodium: 144 mmol/L (ref 135–145)

## 2024-04-23 LAB — CBC WITH DIFFERENTIAL/PLATELET
Abs Immature Granulocytes: 0.03 K/uL (ref 0.00–0.07)
Basophils Absolute: 0 K/uL (ref 0.0–0.1)
Basophils Relative: 0 %
Eosinophils Absolute: 0.1 K/uL (ref 0.0–0.5)
Eosinophils Relative: 1 %
HCT: 33.1 % — ABNORMAL LOW (ref 36.0–46.0)
Hemoglobin: 10.3 g/dL — ABNORMAL LOW (ref 12.0–15.0)
Immature Granulocytes: 0 %
Lymphocytes Relative: 19 %
Lymphs Abs: 1.4 K/uL (ref 0.7–4.0)
MCH: 26 pg (ref 26.0–34.0)
MCHC: 31.1 g/dL (ref 30.0–36.0)
MCV: 83.6 fL (ref 80.0–100.0)
Monocytes Absolute: 0.7 K/uL (ref 0.1–1.0)
Monocytes Relative: 10 %
Neutro Abs: 4.9 K/uL (ref 1.7–7.7)
Neutrophils Relative %: 70 %
Platelets: 296 K/uL (ref 150–400)
RBC: 3.96 MIL/uL (ref 3.87–5.11)
RDW: 18.9 % — ABNORMAL HIGH (ref 11.5–15.5)
WBC: 7 K/uL (ref 4.0–10.5)
nRBC: 0 % (ref 0.0–0.2)

## 2024-04-23 LAB — POTASSIUM: Potassium: 2.8 mmol/L — ABNORMAL LOW (ref 3.5–5.1)

## 2024-04-23 LAB — MAGNESIUM: Magnesium: 2 mg/dL (ref 1.7–2.4)

## 2024-04-23 MED ORDER — POTASSIUM CHLORIDE CRYS ER 20 MEQ PO TBCR
40.0000 meq | EXTENDED_RELEASE_TABLET | Freq: Once | ORAL | Status: AC
  Administered 2024-04-23: 40 meq via ORAL
  Filled 2024-04-23: qty 2

## 2024-04-23 MED ORDER — POTASSIUM CHLORIDE CRYS ER 20 MEQ PO TBCR: 20.0000 meq | EXTENDED_RELEASE_TABLET | Freq: Two times a day (BID) | ORAL | 0 refills | Status: DC

## 2024-04-23 MED ORDER — POTASSIUM CHLORIDE 10 MEQ/100ML IV SOLN
10.0000 meq | INTRAVENOUS | Status: AC
  Administered 2024-04-23 (×3): 10 meq via INTRAVENOUS
  Filled 2024-04-23 (×3): qty 100

## 2024-04-23 MED ORDER — POTASSIUM CHLORIDE ER 10 MEQ PO TBCR: 20.0000 meq | EXTENDED_RELEASE_TABLET | Freq: Every day | ORAL | 0 refills | Status: DC

## 2024-04-23 NOTE — Telephone Encounter (Signed)
Dr John 

## 2024-04-23 NOTE — Telephone Encounter (Signed)
 CRITICAL VALUE STICKER  CRITICAL VALUE: Potassium 2.8   RECEIVER (on-site recipient of call): Jazz  DATE & TIME NOTIFIED: 04/23/2024 at 9:32 AM   MESSENGER (representative from lab): Lonna  MD NOTIFIED: YES

## 2024-04-23 NOTE — Discharge Instructions (Addendum)
 Pick up your potassium prescription tonight.  [facility] [address]  Take 2 tablets when you get home.  Take 2 tablets the following morning, the 2 daily until Monday and then contact your doctors office to have your labs redrawn.  You may also increase your potassium in your diet.  Review your discharge instructions for foods high in potassium. Return to the emergency department if you are having any increasing weakness, nausea vomiting or other concerning symptoms.

## 2024-04-23 NOTE — Telephone Encounter (Signed)
 Patient has been made aware and she gave a verbal understanding that she will go to the hospital.

## 2024-04-23 NOTE — Telephone Encounter (Signed)
 PLEASE ADVISE.

## 2024-04-23 NOTE — ED Provider Notes (Signed)
 F/u potassium. Asymptomatic. Anticipate d/c.  Physical Exam  BP (!) 147/76 (BP Location: Right Arm)   Pulse 68   Temp (!) 97.5 F (36.4 C) (Oral)   Resp 18   SpO2 100%   Physical Exam  Procedures  Procedures  ED Course / MDM   Clinical Course as of 04/23/24 1633  Thu Apr 23, 2024  1337 K 2.6, Cr at baseline 1.4, Mag wnl.  Oral and IV K repletion ordered [MT]    Clinical Course User Index [MT] Trifan, Donnice PARAS, MD   Medical Decision Making Amount and/or Complexity of Data Reviewed Labs: ordered.  Risk Prescription drug management.   Patient remains asymptomatic.  Repeat potassium is at 2.8.  Patient is tolerating oral intake without difficulty.  I have contacted the pharmacy.  Her physician had sent a prescription for oral potassium 20 mEq daily.  I had originally put through a potassium supplement, not realizing that she already had 1 prescribed. [facility] not be [facility] prescriptions.  Patient is instructed to take 20 mEq potassium when she gets home tonight.  She had a 40 mEq extended release in the emergency department as well as 30 mill equivalents IV.  I will also have her take 20 mEq in the morning and then 20 mEq daily as her prescription from her PCP.  Recommendation is for recheck on Monday.  Patient likely had GI losses from her previous hospitalization.  She does however have chronic kidney disease and will need to be monitored closely as she is taking a supplement at low-dose with limited number of tablets to make sure she does not become hyperkalemic in the setting of some baseline insufficiency.       Armenta Canning, MD 04/23/24 [phone]

## 2024-04-23 NOTE — ED Triage Notes (Signed)
 Pt brought in by [facility], call from PCP stating to come to ER for hypokalemia (2.8) pt has no other complaints,has dizziness but this has been recurrent since last hospital stay.

## 2024-04-23 NOTE — ED Provider Notes (Signed)
 [place] [facility] AT [facility] Provider Note   CSN: [id number] Arrival date & time: 04/23/24  1104     Patient presents with: Abnormal Lab (Call from PCP K+ 2.8)   Brooke [name] is a 73 y.o. female presenting from home with concern for hypokalemia, with potassium 2.8 on outpatient labs with her PCP.  Patient reports that she has no acute complaints.  She denies nausea, vomiting or diarrhea.  She says she has not been on potassium supplements because she has chronic kidney disease.  I reviewed external records.  Yesterday K was 2.8, Cr 1.73.  Patient had been briefly hospitalized for 2 days from September 23 to September 25, which was 1 week ago, at that time for nausea and vomiting and difficult tolerating p.o. intake.  Her CT showed ascending colonic colitis and severe hypokalemia with acute on chronic kidney disease.  This was managed with IV fluids, hydration, and she was noted to be COVID-19 positive at that time.  Her baseline creatinine is noted to be 1.4-1.5, but was 1.7 at time of discharge.   HPI     Prior to Admission medications   Medication Sig Start Date End Date Taking? Authorizing Provider  acetaminophen  (TYLENOL ) 325 MG tablet Take 650 mg by mouth every 6 (six) hours as needed for mild pain.    Norleen Lynwood ORN, MD  albuterol  (VENTOLIN  HFA) 108 [phone] Base) MCG/ACT inhaler Inhale 2 puffs into the lungs every 4 (four) hours as needed for wheezing or shortness of breath. 03/05/24   Jeneal Danita Macintosh, MD  allopurinol  (ZYLOPRIM ) 100 MG tablet TAKE 1 TABLET BY MOUTH EVERY DAY 07/08/23   Norleen Lynwood ORN, MD  atropine 1 % ophthalmic solution Place 1 drop into the left eye daily. 01/16/24   [provider]  Azelastine  HCl 137 MCG/SPRAY SOLN PLACE 2 SPRAYS INTO BOTH NOSTRILS 2 (TWO) TIMES DAILY. USE IN EACH NOSTRIL AS DIRECTED Patient taking differently: Place 2 sprays into the nose in the morning and at bedtime. 04/03/24   Norleen Lynwood ORN, MD   brimonidine  (ALPHAGAN ) 0.2 % ophthalmic solution 3 (three) times daily. 1 drop in the left eye three times daily Patient taking differently: Place 1 drop into the left eye in the morning and at bedtime.    [provider]  cetirizine  (ZYRTEC ) 10 MG tablet Take 10 mg by mouth daily. 04/03/24   [provider]  citalopram  (CELEXA ) 40 MG tablet TAKE 1 TABLET BY MOUTH EVERY DAY 03/06/24   Norleen Lynwood ORN, MD  clonazePAM  (KLONOPIN ) 0.5 MG tablet Take 1 tablet (0.5 mg total) by mouth daily as needed (vertigo). 02/25/23   Norleen Lynwood ORN, MD  colchicine  0.6 MG tablet TAKE 0.5 TABLETS (0.3 MG TOTAL) BY MOUTH DAILY AS NEEDED (GOUT OR PSUEDOGOUT PAIN). 10/01/22   Joane Artist RAMAN, MD  diltiazem  (CARDIZEM  CD) 240 MG 24 hr capsule Take 1 capsule (240 mg total) by mouth daily. 10/02/23   Norleen Lynwood ORN, MD  dorzolamide  (TRUSOPT ) 2 % ophthalmic solution Place 1 drop into the left eye 3 (three) times daily.    [provider]  famotidine  (PEPCID ) 20 MG tablet Take 1 tablet (20 mg total) by mouth 2 (two) times daily. 03/05/24   Jeneal Danita Macintosh, MD  Fluticasone -Umeclidin-Vilant (TRELEGY ELLIPTA ) 200-62.5-25 MCG/ACT AEPB Inhale 1 puff into the lungs daily. 03/05/24   Jeneal Danita Macintosh, MD  JARDIANCE  25 MG TABS tablet TAKE 1 TABLET BY MOUTH EVERY DAY BEFORE BREAKFAST Patient taking  differently: Take 25 mg by mouth daily. 11/26/23   Norleen Lynwood ORN, MD  latanoprost  (XALATAN ) 0.005 % ophthalmic solution Place 1 drop into the left eye at bedtime.    [provider]  levocetirizine (XYZAL ) 5 MG tablet Take 1 tablet (5 mg total) by mouth daily. 03/05/24   Jeneal Danita Macintosh, MD  meclizine  (ANTIVERT ) 25 MG tablet TAKE 1 TABLET BY MOUTH 3 TIMES A DAY AS NEEDED FOR DIZZINESS Patient taking differently: Take 25 mg by mouth 3 (three) times daily as needed for dizziness. TAKE 1 TABLET BY MOUTH 3 TIMES A DAY AS NEEDED FOR DIZZINESS 11/04/23   Norleen Lynwood ORN, MD  metoprolol  succinate  (TOPROL -XL) 100 MG 24 hr tablet TAKE 1 TABLET BY MOUTH 2 (TWO) TIMES DAILY. TAKE WITH OR IMMEDIATELY FOLLOWING A MEAL. Patient taking differently: Take 100 mg by mouth daily. 04/03/24   Shlomo Wilbert SAUNDERS, MD  omeprazole  (PRILOSEC) 40 MG capsule Take 1 capsule (40 mg total) by mouth daily. 12/05/21   Norleen Lynwood ORN, MD  ondansetron  (ZOFRAN -ODT) 4 MG disintegrating tablet Take 1 tablet (4 mg total) by mouth every 8 (eight) hours as needed for nausea or vomiting. 04/16/24   Rai, Nydia POUR, MD  potassium chloride  (KLOR-CON  10) 10 MEQ tablet Take 2 tablets (20 mEq total) by mouth daily for 5 days. 04/23/24 04/28/24  Norleen Lynwood ORN, MD  rosuvastatin  (CRESTOR ) 40 MG tablet TAKE 1 TABLET BY MOUTH EVERY DAY 04/22/24   Shlomo Wilbert SAUNDERS, MD  sodium bicarbonate  650 MG tablet Take 1 tablet (650 mg total) by mouth 2 (two) times daily for 7 days. 04/16/24 04/23/24  Rai, Nydia POUR, MD  tirzepatide  (MOUNJARO ) 5 MG/0.5ML Pen Inject 5 mg into the skin once a week. Patient taking differently: Inject 5 mg into the skin once a week. Mondays 03/30/24   Norleen Lynwood ORN, MD  traZODone  (DESYREL ) 100 MG tablet TAKE 1 TABLET (100 MG TOTAL) BY MOUTH AT BEDTIME AS NEEDED. FOR SLEEP Patient taking differently: Take 100 mg by mouth at bedtime. for sleep 03/03/24   Norleen Lynwood ORN, MD    Allergies: Metformin  and related, Ace inhibitors, Codeine, Hydrocodone , Tramadol , and Tylenol  [acetaminophen ]    Review of Systems  Updated Vital Signs BP (!) 147/76 (BP Location: Right Arm)   Pulse 68   Temp (!) 97.5 F (36.4 C) (Oral)   Resp 18   SpO2 100%   Physical Exam Constitutional:      General: She is not in acute distress. HENT:     Head: Normocephalic and atraumatic.  Eyes:     Conjunctiva/sclera: Conjunctivae normal.     Pupils: Pupils are equal, round, and reactive to light.  Cardiovascular:     Rate and Rhythm: Normal rate and regular rhythm.  Pulmonary:     Effort: Pulmonary effort is normal. No respiratory distress.  Abdominal:      General: There is no distension.     Tenderness: There is no abdominal tenderness.  Skin:    General: Skin is warm and dry.  Neurological:     General: No focal deficit present.     Mental Status: She is alert. Mental status is at baseline.  Psychiatric:        Mood and Affect: Mood normal.        Behavior: Behavior normal.     (all labs ordered are listed, but only abnormal results are displayed) Labs Reviewed  CBC WITH DIFFERENTIAL/PLATELET - Abnormal; Notable for the following components:  Result Value   Hemoglobin 10.3 (*)    HCT 33.1 (*)    RDW 18.9 (*)    All other components within normal limits  BASIC METABOLIC PANEL WITH GFR - Abnormal; Notable for the following components:   Potassium 2.6 (*)    CO2 20 (*)    Creatinine, Ser 1.47 (*)    Calcium  8.8 (*)    GFR, Estimated 38 (*)    All other components within normal limits  MAGNESIUM   POTASSIUM    EKG: EKG Interpretation Date/Time:  Thursday April 23 2024 11:18:10 EDT Ventricular Rate:  70 PR Interval:  204 QRS Duration:  134 QT Interval:  465 QTC Calculation: 502 R Axis:   -45  Text Interpretation: Sinus rhythm LVH with IVCD, LAD and secondary repol abnrm Prolonged QT interval Baseline wander in lead(s) III V4 No sig changes from prior tracing Confirmed by Cottie Cough [phone]) on 04/23/2024 11:25:58 AM  Radiology: No results found.   .Critical Care  Performed by: Cottie Cough PARAS, MD Authorized by: Cottie Cough PARAS, MD   Critical care provider statement:    Critical care time (minutes):  30   Critical care time was exclusive of:  Separately billable procedures and treating other patients   Critical care was necessary to treat or prevent imminent or life-threatening deterioration of the following conditions:  Metabolic crisis   Critical care was time spent personally by me on the following activities:  Ordering and performing treatments and interventions, ordering and review of laboratory  studies, ordering and review of radiographic studies, pulse oximetry, review of old charts, examination of patient and evaluation of patient's response to treatment   Care discussed with: admitting provider   Comments:     Hypokalemia repletion    Medications Ordered in the ED  potassium chloride  SA (KLOR-CON  M) CR tablet 40 mEq (40 mEq Oral Given 04/23/24 1346)  potassium chloride  10 mEq in 100 mL IVPB (10 mEq Intravenous New Bag/Given 04/23/24 1528)    Clinical Course as of 04/23/24 1630  Thu Apr 23, 2024  1337 K 2.6, Cr at baseline 1.4, Mag wnl.  Oral and IV K repletion ordered [MT]    Clinical Course User Index [MT] Domingo Fuson, Cough PARAS, MD                                 Medical Decision Making Amount and/or Complexity of Data Reviewed Labs: ordered.  Risk Prescription drug management.   This patient presents to the ED with concern for hypokalemia. This involves an extensive number of treatment options, and is a complaint that carries with it a high risk of complications and morbidity.  The differential diagnosis includes likely related to GI losses, versus medication side effect, versus other  I reviewed the patient's discharge summary medical records from last week, 9/25, she is not on any evident diuretic medications that would be an obvious cause for hypokalemia or potassium wasting.  I suspect most likely the hypokalemia is residual from her recent hospitalization for COVID-19 infection with GI losses.  External records from outside source obtained and reviewed including PCP office records  I ordered and personally interpreted labs.  The pertinent results include: Potassium 2.6.  Creatinine at baseline 1.7.  Magnesium  normal.  Chronic baseline anemia   The patient was maintained on a cardiac monitor.  I personally viewed and interpreted the cardiac monitored which showed an underlying rhythm of: Sinus rhythm  Per my interpretation the patient's ECG shows sinus rhythm with  no acute ischemic findings and no significant changes from prior tracing  I ordered medication including IV and oral potassium for hypokalemia  I have reviewed the patients home medicines and have made adjustments as needed  Test Considered: No GI complaints or indication for repeat CT imaging of the abdomen at this time  Disposition:  Patient is signed out to afternoon ED provider Dr Armenta pending repeat K level check after completion of potassium runs.  If improved, patient could be discharged home on oral potassium, which was already prescribed by her PCP Dr Lynwood Rush per my review of today's office records.      Final diagnoses:  Hypokalemia    ED Discharge Orders     None          Cottie Donnice PARAS, MD 04/23/24 1630

## 2024-04-27 ENCOUNTER — Other Ambulatory Visit: Payer: Self-pay

## 2024-04-27 ENCOUNTER — Inpatient Hospital Stay (HOSPITAL_COMMUNITY)
Admission: EM | Admit: 2024-04-27 | Discharge: 2024-05-05 | DRG: 389 | Disposition: A | Discharge status: Home Health | Attending: Internal Medicine | Admitting: Internal Medicine

## 2024-04-27 DIAGNOSIS — I10 Essential (primary) hypertension: Secondary | ICD-10-CM | POA: Diagnosis present

## 2024-04-27 DIAGNOSIS — Z7951 Long term (current) use of inhaled steroids: Secondary | ICD-10-CM

## 2024-04-27 DIAGNOSIS — K56609 Unspecified intestinal obstruction, unspecified as to partial versus complete obstruction: Secondary | ICD-10-CM | POA: Diagnosis present

## 2024-04-27 DIAGNOSIS — R079 Chest pain, unspecified: Secondary | ICD-10-CM | POA: Diagnosis present

## 2024-04-27 DIAGNOSIS — Z6831 Body mass index (BMI) 31.0-31.9, adult: Secondary | ICD-10-CM

## 2024-04-27 DIAGNOSIS — Z886 Allergy status to analgesic agent status: Secondary | ICD-10-CM

## 2024-04-27 DIAGNOSIS — Z888 Allergy status to other drugs, medicaments and biological substances status: Secondary | ICD-10-CM

## 2024-04-27 DIAGNOSIS — Z8616 Personal history of COVID-19: Secondary | ICD-10-CM

## 2024-04-27 DIAGNOSIS — K219 Gastro-esophageal reflux disease without esophagitis: Secondary | ICD-10-CM | POA: Diagnosis present

## 2024-04-27 DIAGNOSIS — I129 Hypertensive chronic kidney disease with stage 1 through stage 4 chronic kidney disease, or unspecified chronic kidney disease: Secondary | ICD-10-CM | POA: Diagnosis present

## 2024-04-27 DIAGNOSIS — E6609 Other obesity due to excess calories: Secondary | ICD-10-CM | POA: Diagnosis present

## 2024-04-27 DIAGNOSIS — Z79899 Other long term (current) drug therapy: Secondary | ICD-10-CM | POA: Diagnosis not present

## 2024-04-27 DIAGNOSIS — E870 Hyperosmolality and hypernatremia: Secondary | ICD-10-CM | POA: Diagnosis not present

## 2024-04-27 DIAGNOSIS — J449 Chronic obstructive pulmonary disease, unspecified: Secondary | ICD-10-CM | POA: Diagnosis not present

## 2024-04-27 DIAGNOSIS — E785 Hyperlipidemia, unspecified: Secondary | ICD-10-CM | POA: Diagnosis present

## 2024-04-27 DIAGNOSIS — Z8249 Family history of ischemic heart disease and other diseases of the circulatory system: Secondary | ICD-10-CM

## 2024-04-27 DIAGNOSIS — F419 Anxiety disorder, unspecified: Secondary | ICD-10-CM | POA: Diagnosis present

## 2024-04-27 DIAGNOSIS — N1832 Chronic kidney disease, stage 3b: Secondary | ICD-10-CM | POA: Diagnosis present

## 2024-04-27 DIAGNOSIS — R1011 Right upper quadrant pain: Secondary | ICD-10-CM | POA: Diagnosis not present

## 2024-04-27 DIAGNOSIS — E8722 Chronic metabolic acidosis: Secondary | ICD-10-CM | POA: Diagnosis not present

## 2024-04-27 DIAGNOSIS — R931 Abnormal findings on diagnostic imaging of heart and coronary circulation: Secondary | ICD-10-CM

## 2024-04-27 DIAGNOSIS — D631 Anemia in chronic kidney disease: Secondary | ICD-10-CM | POA: Diagnosis not present

## 2024-04-27 DIAGNOSIS — Z7985 Long-term (current) use of injectable non-insulin antidiabetic drugs: Secondary | ICD-10-CM | POA: Diagnosis not present

## 2024-04-27 DIAGNOSIS — Z7984 Long term (current) use of oral hypoglycemic drugs: Secondary | ICD-10-CM | POA: Diagnosis not present

## 2024-04-27 DIAGNOSIS — R109 Unspecified abdominal pain: Principal | ICD-10-CM

## 2024-04-27 DIAGNOSIS — K567 Ileus, unspecified: Secondary | ICD-10-CM | POA: Diagnosis not present

## 2024-04-27 DIAGNOSIS — R070 Pain in throat: Secondary | ICD-10-CM | POA: Diagnosis present

## 2024-04-27 DIAGNOSIS — R935 Abnormal findings on diagnostic imaging of other abdominal regions, including retroperitoneum: Secondary | ICD-10-CM

## 2024-04-27 DIAGNOSIS — N183 Chronic kidney disease, stage 3 unspecified: Secondary | ICD-10-CM | POA: Diagnosis present

## 2024-04-27 DIAGNOSIS — E876 Hypokalemia: Secondary | ICD-10-CM | POA: Diagnosis present

## 2024-04-27 DIAGNOSIS — E861 Hypovolemia: Secondary | ICD-10-CM | POA: Diagnosis present

## 2024-04-27 DIAGNOSIS — N179 Acute kidney failure, unspecified: Secondary | ICD-10-CM | POA: Diagnosis present

## 2024-04-27 DIAGNOSIS — E1122 Type 2 diabetes mellitus with diabetic chronic kidney disease: Secondary | ICD-10-CM | POA: Diagnosis not present

## 2024-04-27 DIAGNOSIS — R9431 Abnormal electrocardiogram [ECG] [EKG]: Secondary | ICD-10-CM | POA: Diagnosis not present

## 2024-04-27 DIAGNOSIS — K5730 Diverticulosis of large intestine without perforation or abscess without bleeding: Secondary | ICD-10-CM | POA: Diagnosis not present

## 2024-04-27 DIAGNOSIS — H409 Unspecified glaucoma: Secondary | ICD-10-CM | POA: Diagnosis present

## 2024-04-27 DIAGNOSIS — Z634 Disappearance and death of family member: Secondary | ICD-10-CM

## 2024-04-27 DIAGNOSIS — E66811 Obesity, class 1: Secondary | ICD-10-CM | POA: Diagnosis present

## 2024-04-27 DIAGNOSIS — Z90710 Acquired absence of both cervix and uterus: Secondary | ICD-10-CM

## 2024-04-27 DIAGNOSIS — R112 Nausea with vomiting, unspecified: Secondary | ICD-10-CM | POA: Diagnosis not present

## 2024-04-27 DIAGNOSIS — Z96653 Presence of artificial knee joint, bilateral: Secondary | ICD-10-CM | POA: Diagnosis present

## 2024-04-27 DIAGNOSIS — J4489 Other specified chronic obstructive pulmonary disease: Secondary | ICD-10-CM | POA: Diagnosis not present

## 2024-04-27 DIAGNOSIS — E119 Type 2 diabetes mellitus without complications: Secondary | ICD-10-CM | POA: Diagnosis present

## 2024-04-27 DIAGNOSIS — I4719 Other supraventricular tachycardia: Secondary | ICD-10-CM | POA: Diagnosis not present

## 2024-04-27 DIAGNOSIS — E039 Hypothyroidism, unspecified: Secondary | ICD-10-CM | POA: Diagnosis present

## 2024-04-27 DIAGNOSIS — E1169 Type 2 diabetes mellitus with other specified complication: Secondary | ICD-10-CM | POA: Diagnosis not present

## 2024-04-27 DIAGNOSIS — L659 Nonscarring hair loss, unspecified: Secondary | ICD-10-CM | POA: Diagnosis present

## 2024-04-27 DIAGNOSIS — R000 Tachycardia, unspecified: Secondary | ICD-10-CM

## 2024-04-27 DIAGNOSIS — Z961 Presence of intraocular lens: Secondary | ICD-10-CM | POA: Diagnosis present

## 2024-04-27 DIAGNOSIS — Z9842 Cataract extraction status, left eye: Secondary | ICD-10-CM

## 2024-04-27 DIAGNOSIS — R1084 Generalized abdominal pain: Secondary | ICD-10-CM | POA: Diagnosis not present

## 2024-04-27 DIAGNOSIS — Z860100 Personal history of colon polyps, unspecified: Secondary | ICD-10-CM

## 2024-04-27 DIAGNOSIS — Z9049 Acquired absence of other specified parts of digestive tract: Secondary | ICD-10-CM

## 2024-04-27 DIAGNOSIS — K7689 Other specified diseases of liver: Secondary | ICD-10-CM | POA: Diagnosis not present

## 2024-04-27 DIAGNOSIS — Z833 Family history of diabetes mellitus: Secondary | ICD-10-CM

## 2024-04-27 DIAGNOSIS — R011 Cardiac murmur, unspecified: Secondary | ICD-10-CM

## 2024-04-27 DIAGNOSIS — F32A Depression, unspecified: Secondary | ICD-10-CM | POA: Diagnosis present

## 2024-04-27 NOTE — ED Provider Triage Note (Signed)
 Emergency Medicine Provider Triage Evaluation Note  Brooke [name] , a 73 y.o. female  was evaluated in triage.  Pt complains of epigastric to right upper quadrant abdominal pain, 8-10/10.  Intermittently for 2 weeks.  She reports multiple episodes of vomiting, reports that it is brown.  She denies any hematemesis.  She denies any change in her appetite.  She reports that she already has had her gallbladder removed.  Review of Systems  Positive: Abdominal pain, nausea, vomiting Negative:   Physical Exam  BP (!) 121/103   Pulse 88   Temp 98.2 F (36.8 C) (Oral)   Resp 18   SpO2 100%  Gen:   Awake, no distress   Resp:  Normal effort  MSK:   Moves extremities without difficulty  Other:  Focal ttp in epigastric region, no rebound, rigidity, guarding  Medical Decision Making  Medically screening exam initiated at 11:01 PM.  Appropriate orders placed.  Ashtan Laton L[place] was informed that the remainder of the evaluation will be completed by another provider, this initial triage assessment does not replace that evaluation, and the importance of remaining in the ED until their evaluation is complete.  Workup initiated in triage    Rosan Sherlean DEL, [place] 04/27/24 2301

## 2024-04-27 NOTE — ED Triage Notes (Signed)
 Pt BIB [facility] abd pain new past week   NVD x 2 weeks   Emesis brown/dark in color.   20 G LAC given 4 mg Zofran  in route.   [facility] VS CBG 106, BP 134/94, PR 92, 98% RA

## 2024-04-27 NOTE — Telephone Encounter (Unsigned)
 Copied from CRM [id number]. Topic: General - Call Back - No Documentation >> Apr 27, 2024 11:46 AM Precious C wrote: Reason for CRM: Patient called in requesting to schedule an appointment regarding her potassium. She stated that she was seen at the hospital and informed that her potassium was low and that she would need to receive a potassium shot or injection.  Patient mentioned she previously received a potassium injection at the clinic on Monday, September 29. I reached out to CAL for clarification on whether to schedule a lab appointment or an office visit and was advised to send a note to the clinic for guidance on next steps.  Please advise and return the patient's call at [phone].

## 2024-04-28 ENCOUNTER — Emergency Department (HOSPITAL_COMMUNITY)

## 2024-04-28 ENCOUNTER — Other Ambulatory Visit: Payer: Self-pay

## 2024-04-28 ENCOUNTER — Encounter (HOSPITAL_COMMUNITY): Payer: Self-pay | Admitting: Family Medicine

## 2024-04-28 DIAGNOSIS — E876 Hypokalemia: Secondary | ICD-10-CM

## 2024-04-28 DIAGNOSIS — E1169 Type 2 diabetes mellitus with other specified complication: Secondary | ICD-10-CM | POA: Diagnosis not present

## 2024-04-28 DIAGNOSIS — R079 Chest pain, unspecified: Secondary | ICD-10-CM

## 2024-04-28 DIAGNOSIS — J449 Chronic obstructive pulmonary disease, unspecified: Secondary | ICD-10-CM

## 2024-04-28 DIAGNOSIS — N183 Chronic kidney disease, stage 3 unspecified: Secondary | ICD-10-CM

## 2024-04-28 DIAGNOSIS — R109 Unspecified abdominal pain: Secondary | ICD-10-CM | POA: Insufficient documentation

## 2024-04-28 DIAGNOSIS — R935 Abnormal findings on diagnostic imaging of other abdominal regions, including retroperitoneum: Secondary | ICD-10-CM | POA: Insufficient documentation

## 2024-04-28 DIAGNOSIS — K567 Ileus, unspecified: Secondary | ICD-10-CM | POA: Diagnosis not present

## 2024-04-28 LAB — URINALYSIS, ROUTINE W REFLEX MICROSCOPIC
Bilirubin Urine: NEGATIVE
Glucose, UA: >=500 mg/dL — AB
Hgb urine dipstick: NEGATIVE
Ketones, ur: 5 mg/dL — AB
Leukocytes,Ua: NEGATIVE
Nitrite: NEGATIVE
Protein, ur: >=300 mg/dL — AB
Specific Gravity, Urine: 1.027 (ref 1.005–1.030)
pH: 5 (ref 5.0–8.0)

## 2024-04-28 LAB — COMPREHENSIVE METABOLIC PANEL WITH GFR
ALT: 12 U/L (ref 0–44)
AST: 17 U/L (ref 15–41)
Albumin: 3.5 g/dL (ref 3.5–5.0)
Alkaline Phosphatase: 124 U/L (ref 38–126)
Anion gap: 14 (ref 5–15)
BUN: 20 mg/dL (ref 8–23)
CO2: 16 mmol/L — ABNORMAL LOW (ref 22–32)
Calcium: 9.6 mg/dL (ref 8.9–10.3)
Chloride: 110 mmol/L (ref 98–111)
Creatinine, Ser: 1.77 mg/dL — ABNORMAL HIGH (ref 0.44–1.00)
GFR, Estimated: 30 mL/min — ABNORMAL LOW (ref >60)
Glucose, Bld: 111 mg/dL — ABNORMAL HIGH (ref 70–99)
Potassium: 3.8 mmol/L (ref 3.5–5.1)
Sodium: 139 mmol/L (ref 135–145)
Total Bilirubin: 0.3 mg/dL (ref 0.0–1.2)
Total Protein: 6.7 g/dL (ref 6.5–8.1)

## 2024-04-28 LAB — TROPONIN T, HIGH SENSITIVITY
Troponin T High Sensitivity: 16 ng/L (ref 0–19)
Troponin T High Sensitivity: 19 ng/L (ref 0–19)

## 2024-04-28 LAB — CBC
HCT: 38.4 % (ref 36.0–46.0)
Hemoglobin: 11.5 g/dL — ABNORMAL LOW (ref 12.0–15.0)
MCH: 25.8 pg — ABNORMAL LOW (ref 26.0–34.0)
MCHC: 29.9 g/dL — ABNORMAL LOW (ref 30.0–36.0)
MCV: 86.3 fL (ref 80.0–100.0)
Platelets: 347 K/uL (ref 150–400)
RBC: 4.45 MIL/uL (ref 3.87–5.11)
RDW: 19.9 % — ABNORMAL HIGH (ref 11.5–15.5)
WBC: 15.8 K/uL — ABNORMAL HIGH (ref 4.0–10.5)
nRBC: 0 % (ref 0.0–0.2)

## 2024-04-28 LAB — LIPASE, BLOOD: Lipase: 11 U/L (ref 11–51)

## 2024-04-28 LAB — GLUCOSE, CAPILLARY
Glucose-Capillary: 116 mg/dL — ABNORMAL HIGH (ref 70–99)
Glucose-Capillary: 95 mg/dL (ref 70–99)

## 2024-04-28 MED ORDER — BISACODYL 10 MG RE SUPP
10.0000 mg | Freq: Once | RECTAL | Status: AC
  Administered 2024-04-28: 10 mg via RECTAL
  Filled 2024-04-28: qty 1

## 2024-04-28 MED ORDER — MORPHINE SULFATE (PF) 4 MG/ML IV SOLN
4.0000 mg | INTRAVENOUS | Status: DC | PRN
  Administered 2024-04-28 – 2024-04-29 (×4): 4 mg via INTRAVENOUS
  Filled 2024-04-28 (×4): qty 1

## 2024-04-28 MED ORDER — SODIUM CHLORIDE 0.9 % IV SOLN: INTRAVENOUS | Status: AC

## 2024-04-28 MED ORDER — INSULIN ASPART 100 UNIT/ML IJ SOLN
0.0000 [IU] | Freq: Three times a day (TID) | INTRAMUSCULAR | Status: DC
  Administered 2024-05-01: 1 [IU] via SUBCUTANEOUS
  Administered 2024-05-02: 2 [IU] via SUBCUTANEOUS
  Administered 2024-05-02 – 2024-05-03 (×2): 1 [IU] via SUBCUTANEOUS

## 2024-04-28 MED ORDER — HYDRALAZINE HCL 20 MG/ML IJ SOLN
10.0000 mg | INTRAMUSCULAR | Status: DC | PRN
  Administered 2024-04-28 – 2024-05-01 (×3): 10 mg via INTRAVENOUS
  Filled 2024-04-28 (×3): qty 1

## 2024-04-28 MED ORDER — NITROGLYCERIN 0.4 MG SL SUBL: 0.4000 mg | SUBLINGUAL_TABLET | SUBLINGUAL | Status: DC | PRN

## 2024-04-28 MED ORDER — SODIUM CHLORIDE 0.9 % IV BOLUS
1000.0000 mL | Freq: Once | INTRAVENOUS | Status: AC
  Administered 2024-04-28: 1000 mL via INTRAVENOUS

## 2024-04-28 MED ORDER — ONDANSETRON HCL 4 MG/2ML IJ SOLN: 4.0000 mg | Freq: Once | INTRAMUSCULAR | Status: DC

## 2024-04-28 MED ORDER — ONDANSETRON HCL 4 MG/2ML IJ SOLN
4.0000 mg | Freq: Four times a day (QID) | INTRAMUSCULAR | Status: DC | PRN
  Administered 2024-04-28 – 2024-05-04 (×6): 4 mg via INTRAVENOUS
  Filled 2024-04-28 (×6): qty 2

## 2024-04-28 MED ORDER — ASPIRIN 81 MG PO CHEW
81.0000 mg | CHEWABLE_TABLET | Freq: Once | ORAL | Status: AC
  Administered 2024-04-28: 81 mg via ORAL
  Filled 2024-04-28: qty 1

## 2024-04-28 MED ORDER — SODIUM CHLORIDE 0.9 % IV SOLN: Freq: Once | INTRAVENOUS | Status: DC

## 2024-04-28 MED ORDER — ORAL CARE MOUTH RINSE: 15.0000 mL | OROMUCOSAL | Status: DC | PRN

## 2024-04-28 MED ORDER — ONDANSETRON HCL 4 MG/2ML IJ SOLN
4.0000 mg | Freq: Once | INTRAMUSCULAR | Status: AC
Start: 2024-04-28 — End: 2024-04-28
  Administered 2024-04-28: 4 mg via INTRAVENOUS
  Filled 2024-04-28: qty 2

## 2024-04-28 NOTE — Assessment & Plan Note (Signed)
 PPI

## 2024-04-28 NOTE — Assessment & Plan Note (Signed)
 Stable from a resp standpoint  Cont home inhalers

## 2024-04-28 NOTE — Telephone Encounter (Signed)
 Called and left voicemail for Pt to call back to schedule an hospital follow up.

## 2024-04-28 NOTE — Assessment & Plan Note (Signed)
 Baseline Cr 1.4-1.8 w/ GFR in the 30s  Cr 1.77 today Monitor
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
## 2024-04-28 NOTE — Assessment & Plan Note (Addendum)
 Multiple days of abdominal pain, decreased po intake w/ note recent admission for colitis 03/2024 in setting of mounjaro  use  CT imaging concerning for ileus  Noted recent Mounjaro  use 10/6 with rapid worsening of abd pain and nausea  NPO  IVF  Pain control and antiemetics  HOLD Mounjaro  Consult general surgery vs. GI as appropriate

## 2024-04-28 NOTE — ED Provider Notes (Addendum)
 [facility]MENT AT [facility] Provider Note   CSN: [id number] Arrival date & time: 04/27/24  2248     Patient presents with: Abdominal Pain   Brooke [name] is a 73 y.o. female.   Pt complains of abdominal pain and vomiting.  Pt reports she has had multiple episodes on and off for the past 2 weeks.  Pt reports pain is worse today.  Pt unable to tolerate oral fluids.  Pt reports vomiting everytime she tries to drink   The history is provided by the patient. No language interpreter was used.  Abdominal Pain Pain location:  Generalized Pain quality: aching   Pain radiates to:  Does not radiate Pain severity:  Moderate Onset quality:  Gradual Duration:  2 weeks Timing:  Constant Progression:  Worsening Chronicity:  New Context: not eating   Relieved by:  Nothing Worsened by:  Nothing Ineffective treatments:  None tried Associated symptoms: vomiting   Risk factors: being elderly        Prior to Admission medications   Medication Sig Start Date End Date Taking? Authorizing Provider  acetaminophen  (TYLENOL ) 325 MG tablet Take 650 mg by mouth every 6 (six) hours as needed for mild pain.    Norleen Lynwood ORN, MD  albuterol  (VENTOLIN  HFA) 108 [phone] Base) MCG/ACT inhaler Inhale 2 puffs into the lungs every 4 (four) hours as needed for wheezing or shortness of breath. 03/05/24   Jeneal Danita Macintosh, MD  allopurinol  (ZYLOPRIM ) 100 MG tablet TAKE 1 TABLET BY MOUTH EVERY DAY 07/08/23   Norleen Lynwood ORN, MD  atropine 1 % ophthalmic solution Place 1 drop into the left eye daily. 01/16/24   [provider]  Azelastine  HCl 137 MCG/SPRAY SOLN PLACE 2 SPRAYS INTO BOTH NOSTRILS 2 (TWO) TIMES DAILY. USE IN EACH NOSTRIL AS DIRECTED Patient taking differently: Place 2 sprays into the nose in the morning and at bedtime. 04/03/24   Norleen Lynwood ORN, MD  brimonidine  (ALPHAGAN ) 0.2 % ophthalmic solution 3 (three) times daily. 1 drop in the left eye three times  daily Patient taking differently: Place 1 drop into the left eye in the morning and at bedtime.    [provider]  cetirizine  (ZYRTEC ) 10 MG tablet Take 10 mg by mouth daily. 04/03/24   [provider]  citalopram  (CELEXA ) 40 MG tablet TAKE 1 TABLET BY MOUTH EVERY DAY 03/06/24   Norleen Lynwood ORN, MD  clonazePAM  (KLONOPIN ) 0.5 MG tablet Take 1 tablet (0.5 mg total) by mouth daily as needed (vertigo). 02/25/23   Norleen Lynwood ORN, MD  colchicine  0.6 MG tablet TAKE 0.5 TABLETS (0.3 MG TOTAL) BY MOUTH DAILY AS NEEDED (GOUT OR PSUEDOGOUT PAIN). 10/01/22   Joane Artist RAMAN, MD  diltiazem  (CARDIZEM  CD) 240 MG 24 hr capsule Take 1 capsule (240 mg total) by mouth daily. 10/02/23   Norleen Lynwood ORN, MD  dorzolamide  (TRUSOPT ) 2 % ophthalmic solution Place 1 drop into the left eye 3 (three) times daily.    [provider]  famotidine  (PEPCID ) 20 MG tablet Take 1 tablet (20 mg total) by mouth 2 (two) times daily. 03/05/24   Jeneal Danita Macintosh, MD  Fluticasone -Umeclidin-Vilant (TRELEGY ELLIPTA ) 200-62.5-25 MCG/ACT AEPB Inhale 1 puff into the lungs daily. 03/05/24   Jeneal Danita Macintosh, MD  JARDIANCE  25 MG TABS tablet TAKE 1 TABLET BY MOUTH EVERY DAY BEFORE BREAKFAST Patient taking differently: Take 25 mg by mouth daily. 11/26/23   Norleen Lynwood ORN, MD  latanoprost  (XALATAN ) 0.005 %  ophthalmic solution Place 1 drop into the left eye at bedtime.    [provider]  levocetirizine (XYZAL ) 5 MG tablet Take 1 tablet (5 mg total) by mouth daily. 03/05/24   Jeneal Danita Macintosh, MD  meclizine  (ANTIVERT ) 25 MG tablet TAKE 1 TABLET BY MOUTH 3 TIMES A DAY AS NEEDED FOR DIZZINESS Patient taking differently: Take 25 mg by mouth 3 (three) times daily as needed for dizziness. TAKE 1 TABLET BY MOUTH 3 TIMES A DAY AS NEEDED FOR DIZZINESS 11/04/23   Norleen Lynwood ORN, MD  metoprolol  succinate (TOPROL -XL) 100 MG 24 hr tablet TAKE 1 TABLET BY MOUTH 2 (TWO) TIMES DAILY. TAKE WITH OR IMMEDIATELY FOLLOWING A  MEAL. Patient taking differently: Take 100 mg by mouth daily. 04/03/24   Shlomo Wilbert SAUNDERS, MD  omeprazole  (PRILOSEC) 40 MG capsule Take 1 capsule (40 mg total) by mouth daily. 12/05/21   Norleen Lynwood ORN, MD  ondansetron  (ZOFRAN -ODT) 4 MG disintegrating tablet Take 1 tablet (4 mg total) by mouth every 8 (eight) hours as needed for nausea or vomiting. 04/16/24   Rai, Nydia POUR, MD  potassium chloride  (KLOR-CON  10) 10 MEQ tablet Take 2 tablets (20 mEq total) by mouth daily for 5 days. 04/23/24 04/28/24  Norleen Lynwood ORN, MD  rosuvastatin  (CRESTOR ) 40 MG tablet TAKE 1 TABLET BY MOUTH EVERY DAY 04/22/24   Shlomo Wilbert SAUNDERS, MD  tirzepatide  (MOUNJARO ) 5 MG/0.5ML Pen Inject 5 mg into the skin once a week. Patient taking differently: Inject 5 mg into the skin once a week. Mondays 03/30/24   Norleen Lynwood ORN, MD  traZODone  (DESYREL ) 100 MG tablet TAKE 1 TABLET (100 MG TOTAL) BY MOUTH AT BEDTIME AS NEEDED. FOR SLEEP Patient taking differently: Take 100 mg by mouth at bedtime. for sleep 03/03/24   Norleen Lynwood ORN, MD    Allergies: Metformin  and related, Ace inhibitors, Codeine, Hydrocodone , Tramadol , and Tylenol  [acetaminophen ]    Review of Systems  Gastrointestinal:  Positive for abdominal pain and vomiting.  All other systems reviewed and are negative.   Updated Vital Signs BP (!) 156/72   Pulse 74   Temp 98.7 F (37.1 C)   Resp 19   SpO2 95%   Physical Exam Vitals and nursing note reviewed.  Constitutional:      Appearance: She is well-developed.  HENT:     Head: Normocephalic.  Eyes:     Extraocular Movements: Extraocular movements intact.  Cardiovascular:     Rate and Rhythm: Normal rate.  Pulmonary:     Effort: Pulmonary effort is normal.  Abdominal:     General: Abdomen is flat. Bowel sounds are increased. There is abdominal bruit.     Palpations: Abdomen is soft.     Tenderness: There is generalized abdominal tenderness and tenderness in the epigastric area.     Hernia: No hernia is present.   Musculoskeletal:        General: Normal range of motion.     Cervical back: Normal range of motion.  Skin:    General: Skin is warm.  Neurological:     General: No focal deficit present.     Mental Status: She is alert and oriented to person, place, and time.  Psychiatric:        Mood and Affect: Mood normal.     (all labs ordered are listed, but only abnormal results are displayed) Labs Reviewed  COMPREHENSIVE METABOLIC PANEL WITH GFR - Abnormal; Notable for the following components:      Result Value  CO2 16 (*)    Glucose, Bld 111 (*)    Creatinine, Ser 1.77 (*)    GFR, Estimated 30 (*)    All other components within normal limits  CBC - Abnormal; Notable for the following components:   WBC 15.8 (*)    Hemoglobin 11.5 (*)    MCH 25.8 (*)    MCHC 29.9 (*)    RDW 19.9 (*)    All other components within normal limits  LIPASE, BLOOD  URINALYSIS, ROUTINE W REFLEX MICROSCOPIC  TROPONIN T, HIGH SENSITIVITY    EKG: EKG Interpretation Date/Time:  Monday April 27 2024 23:32:00 EDT Ventricular Rate:  86 PR Interval:  183 QRS Duration:  124 QT Interval:  408 QTC Calculation: 488 R Axis:   -46  Text Interpretation: Sinus rhythm LVH with IVCD, LAD and secondary repol abnrm Borderline prolonged QT interval Baseline wander in lead(s) V6 Confirmed by Ula Barter [phone]) on 04/27/2024 11:36:16 PM  Radiology: CT ABDOMEN PELVIS WO CONTRAST Result Date: 04/28/2024 CLINICAL DATA:  Right upper quadrant pain EXAM: CT ABDOMEN AND PELVIS WITHOUT CONTRAST TECHNIQUE: Multidetector CT imaging of the abdomen and pelvis was performed following the standard protocol without IV contrast. RADIATION DOSE REDUCTION: This exam was performed according to the departmental dose-optimization program which includes automated exposure control, adjustment of the mA and/or kV according to patient size and/or use of iterative reconstruction technique. COMPARISON:  04/14/2024 FINDINGS: Lower chest: No acute  abnormality. Hepatobiliary: Gallbladder has been surgically removed. Stable cyst is noted within the right lobe of the liver. Pancreas: Unremarkable. No pancreatic ductal dilatation or surrounding inflammatory changes. Spleen: Normal in size without focal abnormality. Adrenals/Urinary Tract: Adrenal glands are within normal limits. Kidneys demonstrate no renal calculi or obstructive changes. Bladder is decompressed. Stomach/Bowel: Scattered diverticular change of the colon is noted. No evidence of diverticulitis is seen. The appendix is within normal limits. Generalized small bowel dilatation is noted which extends to the level of the terminal ileum without obstructing mass. Stomach is decompressed. Vascular/Lymphatic: Aortic atherosclerosis. No enlarged abdominal or pelvic lymph nodes. Reproductive: Status post hysterectomy. No adnexal masses. Other: No abdominal wall hernia or abnormality. No abdominopelvic ascites. Musculoskeletal: No acute or significant osseous findings. IMPRESSION: Generalized small bowel dilatation extending to the terminal ileum. No obstructing mass lesion is seen in these changes may represent a generalized small bowel ileus. Correlate with physical exam. Diverticulosis without diverticulitis. Electronically Signed   By: Oneil Devonshire M.D.   On: 04/28/2024 03:29     Procedures   Medications Ordered in the ED  0.9 %  sodium chloride  infusion (has no administration in time range)  sodium chloride  0.9 % bolus 1,000 mL (has no administration in time range)  ondansetron  (ZOFRAN ) injection 4 mg (has no administration in time range)  ondansetron  (ZOFRAN ) injection 4 mg (4 mg Intravenous Given 04/28/24 0722)                                    Medical Decision Making Pt complains of abdominal pain and vomiting.  Pt seen here on 9/23 and 10/2 for the same.   Amount and/or Complexity of Data Reviewed Labs: ordered. Decision-making details documented in ED Course.    Details: Labs  ordered reviewed and interpreted, WBc count is 15.8 Radiology: ordered and independent interpretation performed. Decision-making details documented in ED Course.    Details: Ct abdomen shows ileus Discussion of management or test interpretation  with external provider(s): Hospitalist consulted and will see for admission General surgery consulted   Risk Prescription drug management. Decision regarding hospitalization.        Final diagnoses:  Abdominal pain, unspecified abdominal location  Ileus [facility])    ED Discharge Orders     None          Flint Sonny POUR, [place] 04/28/24 1108    Flint Sonny POUR [place] 04/28/24 1109    Dasie Faden, MD 04/28/24 1140

## 2024-04-28 NOTE — ED Notes (Signed)
 Assisted patient to restroom via wheelchair, collected urine sample and sent to lab.

## 2024-04-28 NOTE — Assessment & Plan Note (Signed)
 Nonspecific chest pain over multiple days Baseline history of cardiac evaluation including Cath 2015 showed normal coronary arteries.  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 Repeat nuclear stress test 12/2016 for CP showed small moderate intensity anteroapical defect that was fixed and felt consistent with apical thinning, low risk study, EF 62%.  EKG w/ nonspecific changes  Trop negative x 2  ? Some element of overlapping GI assd pain in setting of ileus  Will check 2D ECHO  TIMI score 3-4  Low threshold to consult cardiology

## 2024-04-28 NOTE — Plan of Care (Signed)

## 2024-04-28 NOTE — Assessment & Plan Note (Signed)
 BP stable Titrate home regimen

## 2024-04-28 NOTE — H&P (Addendum)
 History and Physical    Patient: Brooke [name] FMW:[id number] DOB: Mar 08, 1951 DOA: 04/27/2024 DOS: the patient was seen and examined on 04/28/2024 PCP: Norleen Lynwood ORN, MD  Patient coming from: Home  Chief Complaint:  Chief Complaint  Patient presents with   Abdominal Pain   HPI: Brooke [name] is a 73 y.o. female with medical history significant of diabetes mellitus type 2, hypertension, chronic kidney disease stage III, gout, COPD, GERD presenting with chest pain and ileus.  Patient noted to have been recently admitted September 23 through September 25 for issues including nausea and vomiting with a send colitis with concern for complication from Mounjaro  use.  Per the family, patient's had recurrent abdominal pain since discharge.  Family was told to hold the Mounjaro .  However, patient has continued dose with most recent dose yesterday.  After dose yesterday, patient has significant worsening and generalized pain nausea decreased p.o. intake.  No fevers or chills.  Has also had some intermittent chest pain around the same timeframe.  No radiation up the neck or down the left arm.  Chest pain around similar distribution and intensity of abdominal pain.  No shortness of breath.  Chest pain not worse with movement or deep breathing.  No focal hemiparesis or confusion. Presented to the ER afebrile, hemodynamically stable.  Satting well on room air.  White count 15.8, hemoglobin 11.5, platelets 347, creatinine 1.77.  CT abdomen pelvis concerning for ileus. Review of Systems: As mentioned in the history of present illness. All other systems reviewed and are negative. Past Medical History:  Diagnosis Date   Alopecia    Anxiety    Aortic atherosclerosis    Asthma    FOLLOWED BY PCP   Eczema    GERD (gastroesophageal reflux disease)    Gout    04-28-2018--- per pt stable , as been a while since last episode   Heart murmur    History of colon polyps    History of syncope 2015   Hyperlipidemia     Hypertension    Hypothyroidism    OA (osteoarthritis) of knee    bilateral   PVC's (premature ventricular contractions)    Toxic multinodular goiter    03/ 2003  s/p RAI   Type 2 diabetes mellitus (HCC)    followed by pcp   Ventricular tachycardia, polymorphic (HCC) 01/04/2014   primary cardiologist-- dr wilbert turner (hx monitor 2015 showed couplet PVCs, as trigger)   Wears glasses    Wears partial dentures    upper   Past Surgical History:  Procedure Laterality Date   ABDOMINAL HYSTERECTOMY  10/22/1999   WITH BSO   CATARACT EXTRACTION W/ INTRAOCULAR LENS IMPLANT Left YRS AGO   COLONOSCOPY     EXCISION ABDOMINAL WALL MASS  12-20-2005   dr merrilyn @[facility]    neruofibroma   LAPAROSCOPIC CHOLECYSTECTOMY  10/01/2002   dr merrilyn @[facility]    LEFT HEART CATHETERIZATION WITH CORONARY ANGIOGRAM N/A 01/07/2014   Procedure: LEFT HEART CATHETERIZATION WITH CORONARY ANGIOGRAM;  Surgeon: Peter M Swaziland, MD;  Location: [facility] CATH LAB;  Service: Cardiovascular;  Laterality: N/A;   RASTELLI PROCEDURE  6/98 neg   RENAL ARTERY STENT Left 11/2003   angioplasty and stenting   RENAL ARTERY STENT Left 02/2005   re-stenting   TOTAL KNEE ARTHROPLASTY Right 05/05/2018   Procedure: RIGHT TOTAL KNEE ARTHROPLASTY;  Surgeon: Liam Lerner, MD;  Location: [facility];  Service: Orthopedics;  Laterality: Right;   TOTAL KNEE ARTHROPLASTY Left 09/30/2018   Procedure: TOTAL  KNEE ARTHROPLASTY;  Surgeon: Sheril Coy, MD;  Location: [facility];  Service: Orthopedics;  Laterality: Left;   Social History:  reports that she has never smoked. She has never been exposed to tobacco smoke. She has never used smokeless tobacco. She reports that she does not drink alcohol and does not use drugs.  Allergies  Allergen Reactions   Metformin  And Related Nausea And Vomiting   Ace Inhibitors Swelling        Codeine Nausea And Vomiting and Rash   Hydrocodone  Nausea And Vomiting   Tramadol  Nausea And Vomiting   Tylenol  [Acetaminophen ]  Itching and Rash    Allergic to prescription strength tylenol      Family History  Problem Relation Age of Onset   Diabetes Mother    Heart disease Father    Colon cancer Neg Hx    Esophageal cancer Neg Hx    Rectal cancer Neg Hx    Stomach cancer Neg Hx    BRCA 1/2 Neg Hx    Breast cancer Neg Hx     Prior to Admission medications   Medication Sig Start Date End Date Taking? Authorizing Provider  acetaminophen  (TYLENOL ) 325 MG tablet Take 650 mg by mouth every 6 (six) hours as needed for mild pain.    Norleen Lynwood ORN, MD  albuterol  (VENTOLIN  HFA) 108 [phone] Base) MCG/ACT inhaler Inhale 2 puffs into the lungs every 4 (four) hours as needed for wheezing or shortness of breath. 03/05/24   Jeneal Danita Macintosh, MD  allopurinol  (ZYLOPRIM ) 100 MG tablet TAKE 1 TABLET BY MOUTH EVERY DAY 07/08/23   Norleen Lynwood ORN, MD  atropine 1 % ophthalmic solution Place 1 drop into the left eye daily. 01/16/24   [provider]  Azelastine  HCl 137 MCG/SPRAY SOLN PLACE 2 SPRAYS INTO BOTH NOSTRILS 2 (TWO) TIMES DAILY. USE IN EACH NOSTRIL AS DIRECTED Patient taking differently: Place 2 sprays into the nose in the morning and at bedtime. 04/03/24   Norleen Lynwood ORN, MD  brimonidine  (ALPHAGAN ) 0.2 % ophthalmic solution 3 (three) times daily. 1 drop in the left eye three times daily Patient taking differently: Place 1 drop into the left eye in the morning and at bedtime.    [provider]  cetirizine  (ZYRTEC ) 10 MG tablet Take 10 mg by mouth daily. 04/03/24   [provider]  citalopram  (CELEXA ) 40 MG tablet TAKE 1 TABLET BY MOUTH EVERY DAY 03/06/24   Norleen Lynwood ORN, MD  clonazePAM  (KLONOPIN ) 0.5 MG tablet Take 1 tablet (0.5 mg total) by mouth daily as needed (vertigo). 02/25/23   Norleen Lynwood ORN, MD  colchicine  0.6 MG tablet TAKE 0.5 TABLETS (0.3 MG TOTAL) BY MOUTH DAILY AS NEEDED (GOUT OR PSUEDOGOUT PAIN). 10/01/22   Joane Artist RAMAN, MD  diltiazem  (CARDIZEM  CD) 240 MG 24 hr capsule Take 1 capsule (240  mg total) by mouth daily. 10/02/23   Norleen Lynwood ORN, MD  dorzolamide  (TRUSOPT ) 2 % ophthalmic solution Place 1 drop into the left eye 3 (three) times daily.    [provider]  famotidine  (PEPCID ) 20 MG tablet Take 1 tablet (20 mg total) by mouth 2 (two) times daily. 03/05/24   Jeneal Danita Macintosh, MD  Fluticasone -Umeclidin-Vilant (TRELEGY ELLIPTA ) 200-62.5-25 MCG/ACT AEPB Inhale 1 puff into the lungs daily. 03/05/24   Jeneal Danita Macintosh, MD  JARDIANCE  25 MG TABS tablet TAKE 1 TABLET BY MOUTH EVERY DAY BEFORE BREAKFAST Patient taking differently: Take 25 mg by mouth daily. 11/26/23   Norleen Lynwood ORN,  MD  latanoprost  (XALATAN ) 0.005 % ophthalmic solution Place 1 drop into the left eye at bedtime.    [provider]  levocetirizine (XYZAL ) 5 MG tablet Take 1 tablet (5 mg total) by mouth daily. 03/05/24   Jeneal Danita Macintosh, MD  meclizine  (ANTIVERT ) 25 MG tablet TAKE 1 TABLET BY MOUTH 3 TIMES A DAY AS NEEDED FOR DIZZINESS Patient taking differently: Take 25 mg by mouth 3 (three) times daily as needed for dizziness. TAKE 1 TABLET BY MOUTH 3 TIMES A DAY AS NEEDED FOR DIZZINESS 11/04/23   Norleen Lynwood ORN, MD  metoprolol  succinate (TOPROL -XL) 100 MG 24 hr tablet TAKE 1 TABLET BY MOUTH 2 (TWO) TIMES DAILY. TAKE WITH OR IMMEDIATELY FOLLOWING A MEAL. Patient taking differently: Take 100 mg by mouth daily. 04/03/24   Shlomo Wilbert SAUNDERS, MD  omeprazole  (PRILOSEC) 40 MG capsule Take 1 capsule (40 mg total) by mouth daily. 12/05/21   Norleen Lynwood ORN, MD  ondansetron  (ZOFRAN -ODT) 4 MG disintegrating tablet Take 1 tablet (4 mg total) by mouth every 8 (eight) hours as needed for nausea or vomiting. 04/16/24   Rai, Ripudeep K, MD  potassium chloride  (KLOR-CON  10) 10 MEQ tablet Take 2 tablets (20 mEq total) by mouth daily for 5 days. 04/23/24 04/28/24  Norleen Lynwood ORN, MD  rosuvastatin  (CRESTOR ) 40 MG tablet TAKE 1 TABLET BY MOUTH EVERY DAY 04/22/24   Shlomo Wilbert SAUNDERS, MD  tirzepatide  (MOUNJARO ) 5 MG/0.5ML  Pen Inject 5 mg into the skin once a week. Patient taking differently: Inject 5 mg into the skin once a week. Mondays 03/30/24   Norleen Lynwood ORN, MD  traZODone  (DESYREL ) 100 MG tablet TAKE 1 TABLET (100 MG TOTAL) BY MOUTH AT BEDTIME AS NEEDED. FOR SLEEP Patient taking differently: Take 100 mg by mouth at bedtime. for sleep 03/03/24   Norleen Lynwood ORN, MD    Physical Exam: Vitals:   04/28/24 0853 04/28/24 0912 04/28/24 1035 04/28/24 1318  BP: (!) 172/78 (!) 172/78 (!) 182/91 (!) 154/70  Pulse: 85 85 91 91  Resp: 18 18 20 16   Temp: 97.7 F (36.5 C) 97.7 F (36.5 C)  98.7 F (37.1 C)  TempSrc: Oral Oral  Oral  SpO2:   100%   Weight:  80.7 kg    Height:  5' 3 (1.6 m)     Physical Exam Constitutional:      Appearance: She is normal weight.  HENT:     Head: Normocephalic and atraumatic.     Nose: Nose normal.     Mouth/Throat:     Mouth: Mucous membranes are moist.  Eyes:     Pupils: Pupils are equal, round, and reactive to light.  Cardiovascular:     Rate and Rhythm: Normal rate and regular rhythm.  Pulmonary:     Effort: Pulmonary effort is normal.  Abdominal:     General: Bowel sounds are normal.     Comments: Mild generalized abdominal pain   Musculoskeletal:        General: Normal range of motion.  Skin:    General: Skin is warm.  Neurological:     General: No focal deficit present.  Psychiatric:        Mood and Affect: Mood normal.     Data Reviewed:  There are no new results to review at this time.  CT ABDOMEN PELVIS WO CONTRAST CLINICAL DATA:  Right upper quadrant pain  EXAM: CT ABDOMEN AND PELVIS WITHOUT CONTRAST  TECHNIQUE: Multidetector CT imaging of the abdomen and  pelvis was performed following the standard protocol without IV contrast.  RADIATION DOSE REDUCTION: This exam was performed according to the departmental dose-optimization program which includes automated exposure control, adjustment of the mA and/or kV according to patient size and/or use  of iterative reconstruction technique.  COMPARISON:  04/14/2024  FINDINGS: Lower chest: No acute abnormality.  Hepatobiliary: Gallbladder has been surgically removed. Stable cyst is noted within the right lobe of the liver.  Pancreas: Unremarkable. No pancreatic ductal dilatation or surrounding inflammatory changes.  Spleen: Normal in size without focal abnormality.  Adrenals/Urinary Tract: Adrenal glands are within normal limits. Kidneys demonstrate no renal calculi or obstructive changes. Bladder is decompressed.  Stomach/Bowel: Scattered diverticular change of the colon is noted. No evidence of diverticulitis is seen. The appendix is within normal limits. Generalized small bowel dilatation is noted which extends to the level of the terminal ileum without obstructing mass. Stomach is decompressed.  Vascular/Lymphatic: Aortic atherosclerosis. No enlarged abdominal or pelvic lymph nodes.  Reproductive: Status post hysterectomy. No adnexal masses.  Other: No abdominal wall hernia or abnormality. No abdominopelvic ascites.  Musculoskeletal: No acute or significant osseous findings.  IMPRESSION: Generalized small bowel dilatation extending to the terminal ileum. No obstructing mass lesion is seen in these changes may represent a generalized small bowel ileus. Correlate with physical exam.  Diverticulosis without diverticulitis.  Electronically Signed   By: Oneil Devonshire M.D.   On: 04/28/2024 03:29  Lab Results  Component Value Date   WBC 15.8 (H) 04/27/2024   HGB 11.5 (L) 04/27/2024   HCT 38.4 04/27/2024   MCV 86.3 04/27/2024   PLT 347 04/27/2024   Last metabolic panel Lab Results  Component Value Date   GLUCOSE 111 (H) 04/27/2024   NA 139 04/27/2024   K 3.8 04/27/2024   CL 110 04/27/2024   CO2 16 (L) 04/27/2024   BUN 20 04/27/2024   CREATININE 1.77 (H) 04/27/2024   GFRNONAA 30 (L) 04/27/2024   CALCIUM  9.6 04/27/2024   PHOS 3.4 06/21/2020   PROT 6.7  04/27/2024   ALBUMIN 3.5 04/27/2024   LABGLOB 2.4 10/27/2021   AGRATIO 1.7 10/27/2021   BILITOT 0.3 04/27/2024   ALKPHOS 124 04/27/2024   AST 17 04/27/2024   ALT 12 04/27/2024   ANIONGAP 14 04/27/2024    Assessment and Plan: * Ileus (HCC) Multiple days of abdominal pain, decreased po intake w/ note recent admission for colitis 03/2024 in setting of mounjaro  use  CT imaging concerning for ileus  Noted recent Mounjaro  use 10/6 with rapid worsening of abd pain and nausea  NPO  IVF  Pain control and antiemetics  HOLD Mounjaro  Consult general surgery vs. GI as appropriate   Chest pain Nonspecific chest pain over multiple days Baseline history of cardiac evaluation including Cath 2015 showed normal coronary arteries.  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 Repeat nuclear stress test 12/2016 for CP showed small moderate intensity anteroapical defect that was fixed and felt consistent with apical thinning, low risk study, EF 62%.  EKG w/ nonspecific changes  Trop negative x 2  ? Some element of overlapping GI assd pain in setting of ileus  Will check 2D ECHO  TIMI score 3-4  Low threshold to consult cardiology   COPD (chronic obstructive pulmonary disease) (HCC) Stable from a resp standpoint  Cont home inhalers   CKD (chronic kidney disease) stage 3, GFR 30-59 ml/min (HCC) Baseline Cr 1.4-1.8 w/ GFR in the 30s  Cr 1.77 today Monitor  GERD PPI    Essential hypertension BP stable  Titrate home regimen    Diabetes mellitus with chronic kidney disease (HCC) SSI  Monitor        Advance Care Planning:   Code Status: Prior   Consults: None at present  Family Communication: Family at the bedside   Severity of Illness: The appropriate patient status for this patient is OBSERVATION. Observation status is judged to be reasonable and necessary in order to provide the required intensity of service to ensure the patient's safety. The patient's presenting symptoms,  physical exam findings, and initial radiographic and laboratory data in the context of their medical condition is felt to place them at decreased risk for further clinical deterioration. Furthermore, it is anticipated that the patient will be medically stable for discharge from the hospital within 2 midnights of admission.   Author: Elspeth JINNY Masters, MD 04/28/2024 1:24 PM  For on call review [email or web address].

## 2024-04-28 NOTE — Assessment & Plan Note (Signed)
 SSI  Monitor

## 2024-04-29 ENCOUNTER — Observation Stay (HOSPITAL_COMMUNITY)

## 2024-04-29 DIAGNOSIS — E039 Hypothyroidism, unspecified: Secondary | ICD-10-CM | POA: Diagnosis not present

## 2024-04-29 DIAGNOSIS — I4719 Other supraventricular tachycardia: Secondary | ICD-10-CM | POA: Diagnosis not present

## 2024-04-29 DIAGNOSIS — E1122 Type 2 diabetes mellitus with diabetic chronic kidney disease: Secondary | ICD-10-CM | POA: Diagnosis not present

## 2024-04-29 DIAGNOSIS — R011 Cardiac murmur, unspecified: Secondary | ICD-10-CM | POA: Diagnosis not present

## 2024-04-29 DIAGNOSIS — E870 Hyperosmolality and hypernatremia: Secondary | ICD-10-CM | POA: Diagnosis not present

## 2024-04-29 DIAGNOSIS — I129 Hypertensive chronic kidney disease with stage 1 through stage 4 chronic kidney disease, or unspecified chronic kidney disease: Secondary | ICD-10-CM | POA: Diagnosis not present

## 2024-04-29 DIAGNOSIS — Z7984 Long term (current) use of oral hypoglycemic drugs: Secondary | ICD-10-CM | POA: Diagnosis not present

## 2024-04-29 DIAGNOSIS — E8722 Chronic metabolic acidosis: Secondary | ICD-10-CM | POA: Diagnosis not present

## 2024-04-29 DIAGNOSIS — F32A Depression, unspecified: Secondary | ICD-10-CM | POA: Diagnosis present

## 2024-04-29 DIAGNOSIS — R079 Chest pain, unspecified: Secondary | ICD-10-CM

## 2024-04-29 DIAGNOSIS — K567 Ileus, unspecified: Secondary | ICD-10-CM | POA: Diagnosis not present

## 2024-04-29 DIAGNOSIS — E861 Hypovolemia: Secondary | ICD-10-CM | POA: Diagnosis present

## 2024-04-29 DIAGNOSIS — R935 Abnormal findings on diagnostic imaging of other abdominal regions, including retroperitoneum: Secondary | ICD-10-CM | POA: Diagnosis not present

## 2024-04-29 DIAGNOSIS — Z7985 Long-term (current) use of injectable non-insulin antidiabetic drugs: Secondary | ICD-10-CM | POA: Diagnosis not present

## 2024-04-29 DIAGNOSIS — K219 Gastro-esophageal reflux disease without esophagitis: Secondary | ICD-10-CM | POA: Diagnosis present

## 2024-04-29 DIAGNOSIS — Z4682 Encounter for fitting and adjustment of non-vascular catheter: Secondary | ICD-10-CM | POA: Diagnosis not present

## 2024-04-29 DIAGNOSIS — J4489 Other specified chronic obstructive pulmonary disease: Secondary | ICD-10-CM | POA: Diagnosis not present

## 2024-04-29 DIAGNOSIS — N1832 Chronic kidney disease, stage 3b: Secondary | ICD-10-CM | POA: Diagnosis not present

## 2024-04-29 DIAGNOSIS — N179 Acute kidney failure, unspecified: Secondary | ICD-10-CM | POA: Diagnosis not present

## 2024-04-29 DIAGNOSIS — N183 Chronic kidney disease, stage 3 unspecified: Secondary | ICD-10-CM | POA: Diagnosis not present

## 2024-04-29 DIAGNOSIS — D631 Anemia in chronic kidney disease: Secondary | ICD-10-CM | POA: Diagnosis not present

## 2024-04-29 DIAGNOSIS — E785 Hyperlipidemia, unspecified: Secondary | ICD-10-CM | POA: Diagnosis present

## 2024-04-29 DIAGNOSIS — Z8616 Personal history of COVID-19: Secondary | ICD-10-CM | POA: Diagnosis not present

## 2024-04-29 DIAGNOSIS — R000 Tachycardia, unspecified: Secondary | ICD-10-CM | POA: Diagnosis not present

## 2024-04-29 DIAGNOSIS — K56609 Unspecified intestinal obstruction, unspecified as to partial versus complete obstruction: Secondary | ICD-10-CM | POA: Diagnosis not present

## 2024-04-29 DIAGNOSIS — Z79899 Other long term (current) drug therapy: Secondary | ICD-10-CM | POA: Diagnosis not present

## 2024-04-29 DIAGNOSIS — Z6831 Body mass index (BMI) 31.0-31.9, adult: Secondary | ICD-10-CM | POA: Diagnosis not present

## 2024-04-29 DIAGNOSIS — K560 Paralytic ileus: Secondary | ICD-10-CM | POA: Diagnosis not present

## 2024-04-29 DIAGNOSIS — R931 Abnormal findings on diagnostic imaging of heart and coronary circulation: Secondary | ICD-10-CM | POA: Diagnosis not present

## 2024-04-29 DIAGNOSIS — Z8249 Family history of ischemic heart disease and other diseases of the circulatory system: Secondary | ICD-10-CM | POA: Diagnosis not present

## 2024-04-29 DIAGNOSIS — R109 Unspecified abdominal pain: Secondary | ICD-10-CM | POA: Diagnosis not present

## 2024-04-29 DIAGNOSIS — E876 Hypokalemia: Secondary | ICD-10-CM | POA: Diagnosis not present

## 2024-04-29 LAB — ECHOCARDIOGRAM COMPLETE
AR max vel: 1.58 cm2
AV Area VTI: 1.6 cm2
AV Area mean vel: 1.51 cm2
AV Mean grad: 13 mmHg
AV Peak grad: 27.2 mmHg
Ao pk vel: 2.61 m/s
Area-P 1/2: 8.25 cm2
Calc EF: 67.5 %
Height: 63 in
MV VTI: 2.84 cm2
S' Lateral: 1.7 cm
Single Plane A2C EF: 72.4 %
Single Plane A4C EF: 59.4 %
Weight: 2847.99 [oz_av]

## 2024-04-29 LAB — BASIC METABOLIC PANEL WITH GFR
Anion gap: 15 (ref 5–15)
BUN: 24 mg/dL — ABNORMAL HIGH (ref 8–23)
CO2: 16 mmol/L — ABNORMAL LOW (ref 22–32)
Calcium: 8.7 mg/dL — ABNORMAL LOW (ref 8.9–10.3)
Chloride: 116 mmol/L — ABNORMAL HIGH (ref 98–111)
Creatinine, Ser: 1.49 mg/dL — ABNORMAL HIGH (ref 0.44–1.00)
GFR, Estimated: 37 mL/min — ABNORMAL LOW (ref >60)
Glucose, Bld: 96 mg/dL (ref 70–99)
Potassium: 3.5 mmol/L (ref 3.5–5.1)
Sodium: 147 mmol/L — ABNORMAL HIGH (ref 135–145)

## 2024-04-29 LAB — CBC
HCT: 33.2 % — ABNORMAL LOW (ref 36.0–46.0)
Hemoglobin: 10.3 g/dL — ABNORMAL LOW (ref 12.0–15.0)
MCH: 26.8 pg (ref 26.0–34.0)
MCHC: 31 g/dL (ref 30.0–36.0)
MCV: 86.5 fL (ref 80.0–100.0)
Platelets: 258 K/uL (ref 150–400)
RBC: 3.84 MIL/uL — ABNORMAL LOW (ref 3.87–5.11)
RDW: 19.9 % — ABNORMAL HIGH (ref 11.5–15.5)
WBC: 6.3 K/uL (ref 4.0–10.5)
nRBC: 0 % (ref 0.0–0.2)

## 2024-04-29 LAB — GLUCOSE, CAPILLARY
Glucose-Capillary: 102 mg/dL — ABNORMAL HIGH (ref 70–99)
Glucose-Capillary: 111 mg/dL — ABNORMAL HIGH (ref 70–99)
Glucose-Capillary: 113 mg/dL — ABNORMAL HIGH (ref 70–99)
Glucose-Capillary: 114 mg/dL — ABNORMAL HIGH (ref 70–99)
Glucose-Capillary: 114 mg/dL — ABNORMAL HIGH (ref 70–99)

## 2024-04-29 MED ORDER — METOPROLOL TARTRATE 5 MG/5ML IV SOLN
5.0000 mg | INTRAVENOUS | Status: DC
  Administered 2024-04-29 – 2024-04-30 (×6): 5 mg via INTRAVENOUS
  Filled 2024-04-29 (×7): qty 5

## 2024-04-29 MED ORDER — LEVALBUTEROL HCL 0.63 MG/3ML IN NEBU
0.6300 mg | INHALATION_SOLUTION | Freq: Four times a day (QID) | RESPIRATORY_TRACT | Status: DC | PRN
  Administered 2024-04-30: 0.63 mg via RESPIRATORY_TRACT
  Filled 2024-04-29: qty 3

## 2024-04-29 MED ORDER — PROCHLORPERAZINE EDISYLATE 10 MG/2ML IJ SOLN
5.0000 mg | Freq: Once | INTRAMUSCULAR | Status: AC | PRN
  Administered 2024-04-29: 5 mg via INTRAVENOUS
  Filled 2024-04-29: qty 2

## 2024-04-29 MED ORDER — BISACODYL 10 MG RE SUPP
10.0000 mg | Freq: Once | RECTAL | Status: AC
  Administered 2024-04-29: 10 mg via RECTAL
  Filled 2024-04-29: qty 1

## 2024-04-29 MED ORDER — SODIUM CHLORIDE 0.9 % IV SOLN: INTRAVENOUS | Status: DC

## 2024-04-29 MED ORDER — MORPHINE SULFATE (PF) 2 MG/ML IV SOLN
2.0000 mg | INTRAVENOUS | Status: DC | PRN
  Administered 2024-04-29 – 2024-04-30 (×3): 2 mg via INTRAVENOUS
  Administered 2024-04-30 – 2024-05-02 (×3): 4 mg via INTRAVENOUS
  Filled 2024-04-29 (×6): qty 1
  Filled 2024-04-29 (×2): qty 2

## 2024-04-29 MED ORDER — SODIUM BICARBONATE 650 MG PO TABS
650.0000 mg | ORAL_TABLET | Freq: Two times a day (BID) | ORAL | Status: DC
  Administered 2024-04-30 – 2024-05-05 (×11): 650 mg
  Filled 2024-04-29 (×13): qty 1

## 2024-04-29 NOTE — Care Management Obs Status (Signed)
 MEDICARE OBSERVATION STATUS NOTIFICATION   Patient Details  Name: LILLIAM CHAMBLEE MRN: [id number] Date of Birth: Oct 12, 1950   Medicare Observation Status Notification Given:  Yes    Doneta Glenys DASEN, RN 04/29/2024, 4:39 PM

## 2024-04-29 NOTE — Progress Notes (Signed)
 Patient has +1 edema BLE this is a new finding not previously documented on and she has exertional dyspnea when walking to and from the bathroom. She says the exertional dyspnea has been occurring for 1 week.   RN notified Lynwood Kipper, NP via secure chat.

## 2024-04-29 NOTE — Progress Notes (Addendum)
 [facility] Progress Note  CC:   Ileus   Subjective:  Says that she feels worse today.  The pain and nausea is worse but no recurrent vomiting so far.  X-ray this AM with no improvement.  No results with dulcolax suppository and not passing any gas/flatus.  Objective:  Vital signs in last 24 hours: Temp:  [98.3 F (36.8 C)-98.7 F (37.1 C)] 98.5 F (36.9 C) (10/08 0522) Pulse Rate:  [91-98] 98 (10/08 0522) Resp:  [16-20] 18 (10/08 0522) BP: (154-182)/(70-91) 177/81 (10/08 0522) SpO2:  [100 %] 100 % (10/08 0522) Last BM Date :  (per patient 2 weeks ago) General:  Alert, Well-developed, in NAD Heart:  Regular rate and rhythm; no murmurs Pulm:  CTAB.  No W/R/R. Abdomen:  Softly distended.  No bowel sounds noted.  Diffuse TTP. Extremities:  Without edema. Neurologic:  Alert and oriented x 4;  grossly normal neurologically. Psych:  Alert and cooperative. Normal mood and affect.  Intake/Output from previous day: 10/07 0701 - 10/08 0700 In: 920.8 [I.V.:920.8] Out: -   Lab Results: Recent Labs    04/27/24 2256  WBC 15.8*  HGB 11.5*  HCT 38.4  PLT 347   BMET Recent Labs    04/27/24 2256  NA 139  K 3.8  CL 110  CO2 16*  GLUCOSE 111*  BUN 20  CREATININE 1.77*  CALCIUM  9.6   LFT Recent Labs    04/27/24 2256  PROT 6.7  ALBUMIN 3.5  AST 17  ALT 12  ALKPHOS 124  BILITOT 0.3   CT ABDOMEN PELVIS WO CONTRAST Result Date: 04/28/2024 CLINICAL DATA:  Right upper quadrant pain EXAM: CT ABDOMEN AND PELVIS WITHOUT CONTRAST TECHNIQUE: Multidetector CT imaging of the abdomen and pelvis was performed following the standard protocol without IV contrast. RADIATION DOSE REDUCTION: This exam was performed according to the departmental dose-optimization program which includes automated exposure control, adjustment of the mA and/or kV according to patient size and/or use of iterative reconstruction technique. COMPARISON:  04/14/2024 FINDINGS: Lower chest: No acute  abnormality. Hepatobiliary: Gallbladder has been surgically removed. Stable cyst is noted within the right lobe of the liver. Pancreas: Unremarkable. No pancreatic ductal dilatation or surrounding inflammatory changes. Spleen: Normal in size without focal abnormality. Adrenals/Urinary Tract: Adrenal glands are within normal limits. Kidneys demonstrate no renal calculi or obstructive changes. Bladder is decompressed. Stomach/Bowel: Scattered diverticular change of the colon is noted. No evidence of diverticulitis is seen. The appendix is within normal limits. Generalized small bowel dilatation is noted which extends to the level of the terminal ileum without obstructing mass. Stomach is decompressed. Vascular/Lymphatic: Aortic atherosclerosis. No enlarged abdominal or pelvic lymph nodes. Reproductive: Status post hysterectomy. No adnexal masses. Other: No abdominal wall hernia or abnormality. No abdominopelvic ascites. Musculoskeletal: No acute or significant osseous findings. IMPRESSION: Generalized small bowel dilatation extending to the terminal ileum. No obstructing mass lesion is seen in these changes may represent a generalized small bowel ileus. Correlate with physical exam. Diverticulosis without diverticulitis. Electronically Signed   By: Oneil Devonshire M.D.   On: 04/28/2024 03:29   Assessment / Plan: 73 year old female with complaints of abdominal pain, nausea, and vomiting for the past 2 weeks.  Was admitted for a couple of days and treated conservatively 2 weeks ago for the same complaints.  CT scan showed suspected mild infectious or inflammatory colitis in the descending colon, but she was not having diarrhea.  Abdominal pain recurred and she has been vomiting  significantly so returned to the hospital.  CT scan now shows diffuse small bowel ileus.  She is on Mounjaro  and that dose was recently increased about 3 weeks ago.  She only skipped 1 dose, just took her dose yesterday again. *Hypokalemia:  Likely from GI losses.  Potassium is 2.8, which has been corrected at 3.8 yesterday, labs pending for today. *Leukocytosis at 15.8 K: Likely reactive.  Improved/resolved at 6.3K today. *COPD, stable *Chronic kidney disease stage III: Kidney function about at baseline. *Type 2 diabetes mellitus  - Will order NGT placement to LIS. - Will give another dulcolax suppository since no result with the other and no BM since last week. - Keep potassium greater than 4 if possible to optimize bowel/gut function.  Trend labs. - Likely should discontinue Mounjaro  use as that can cause ileus. - IV fluids, antiemetics.  Pain medications to be used conservatively as those can slow gut function.    LOS: 0 days   Harlene BIRCH. Zehr  04/29/2024, 9:17 AM  GI ATTENDING  Interval history, data, and x-rays reviewed.  Agree with comprehensive interval progress note as outlined.  As noted above, patient feeling worse.  Persistent ileus on x-ray.  Some gas in the colon.  No response to suppository.  At this point, would place NG tube as she is worse.  Agree with repeat suppository.  Follow-up x-rays in AM.  Continue to monitor closely.  Norleen SAILOR. Abran Raddle., M.D. [facility] Division of Gastroenterology

## 2024-04-29 NOTE — Evaluation (Signed)
 Physical Therapy Evaluation Patient Details Name: Brooke [name] MRN: [id number] DOB: Oct 16, 1950 Today's Date: 04/29/2024  History of Present Illness  73yo who presented on 10/7 with abdominal pain. CT with ileus. PMH: DM, HTN, stage 3 CKD, and COPD with recent admission for n/v associated with Mounjaro  (continued use) and COVID  Clinical Impression  Pt admitted with above diagnosis.  Pt is typically ind with ADLs, amb household distances with rollator at baseline. Pt amb ~ 79' with RW, CGA and mildly unsteady gait but no overt LOB. Pt feels slightly weaker than her baseline. Will likely benefit from HHPT if agreeable.  Pt currently with functional limitations due to the deficits listed below (see PT Problem List). Pt will benefit from acute skilled PT to increase their independence and safety with mobility to allow discharge.           If plan is discharge home, recommend the following: A little help with bathing/dressing/bathroom;Help with stairs or ramp for entrance;Assist for transportation;Assistance with cooking/housework   Can travel by private vehicle        Equipment Recommendations None recommended by PT  Recommendations for Other Services       Functional Status Assessment Patient has had a recent decline in their functional status and demonstrates the ability to make significant improvements in function in a reasonable and predictable amount of time.     Precautions / Restrictions Precautions Precautions: Fall Restrictions Weight Bearing Restrictions Per Provider Order: No      Mobility  Bed Mobility Overal bed mobility: Needs Assistance Bed Mobility: Supine to Sit, Sit to Supine     Supine to sit: Supervision, Modified independent (Device/Increase time) Sit to supine: Modified independent (Device/Increase time)   General bed mobility comments: management of NGT only    Transfers Overall transfer level: Needs assistance Equipment used: Rolling walker (2  wheels) Transfers: Sit to/from Stand Sit to Stand: Contact guard assist           General transfer comment: STS x2, cues for hand placement and safety    Ambulation/Gait Ambulation/Gait assistance: Contact guard assist Gait Distance (Feet): 80 Feet Assistive device: Rolling walker (2 wheels) Gait Pattern/deviations: Step-through pattern, Decreased step length - left, Decreased step length - right, Decreased stride length Gait velocity: decr     General Gait Details: CGA for safety, pt unsteady at times however no overt LOB. pt able to amb short distance without device, CGA  Stairs            Wheelchair Mobility     Tilt Bed    Modified Rankin (Stroke Patients Only)       Balance Overall balance assessment: Needs assistance Sitting-balance support: No upper extremity supported, Feet unsupported Sitting balance-Leahy Scale: Good     Standing balance support: No upper extremity supported, Reliant on assistive device for balance, During functional activity Standing balance-Leahy Scale: Fair                               Pertinent Vitals/Pain Pain Assessment Pain Assessment: No/denies pain    Home Living Family/patient expects to be discharged to:: Private residence Living Arrangements: Children;Other relatives Available Help at Discharge: Family Type of Home: House Home Access: Stairs to enter       Home Layout: One level Home Equipment: Rollator (4 wheels);[place] - single point Additional Comments: lives with son    Prior Function Prior Level of Function : Independent/Modified Independent  Mobility Comments: rollator for household amb ADLs Comments: reports dtr and sone assist as needed but she is typically ind with ADLs     Extremity/Trunk Assessment   Upper Extremity Assessment Upper Extremity Assessment: Overall WFL for tasks assessed    Lower Extremity Assessment Lower Extremity Assessment: Overall WFL for tasks  assessed       Communication   Communication Communication: No apparent difficulties    Cognition Arousal: Alert Behavior During Therapy: WFL for tasks assessed/performed   PT - Cognitive impairments: No apparent impairments                         Following commands: Intact       Cueing Cueing Techniques: Verbal cues     General Comments      Exercises     Assessment/Plan    PT Assessment Patient needs continued PT services  PT Problem List Decreased activity tolerance;Decreased balance;Decreased mobility       PT Treatment Interventions DME instruction;Therapeutic activities;Gait training;Functional mobility training;Therapeutic exercise;Patient/family education;Balance training    PT Goals (Current goals can be found in the Care Plan section)  Acute Rehab PT Goals PT Goal Formulation: With patient Time For Goal Achievement: 05/13/24 Potential to Achieve Goals: Good    Frequency Min 2X/week     Co-evaluation               AM-PAC PT 6 Clicks Mobility  Outcome Measure Help needed turning from your back to your side while in a flat bed without using bedrails?: None Help needed moving from lying on your back to sitting on the side of a flat bed without using bedrails?: None Help needed moving to and from a bed to a chair (including a wheelchair)?: A Little Help needed standing up from a chair using your arms (e.g., wheelchair or bedside chair)?: A Little Help needed to walk in hospital room?: A Little Help needed climbing 3-5 steps with a railing? : A Little 6 Click Score: 20    End of Session Equipment Utilized During Treatment: Gait belt Activity Tolerance: Patient tolerated treatment well Patient left: with call bell/phone within reach;in bed;with bed alarm set Nurse Communication: Mobility status PT Visit Diagnosis: Other abnormalities of gait and mobility (R26.89)    Time: 1736-1750 PT Time Calculation (min) (ACUTE ONLY): 14  min   Charges:   PT Evaluation $PT Eval Low Complexity: 1 Low   PT General Charges $$ ACUTE PT VISIT: 1 Visit         Caley Ciaramitaro, PT  Acute Rehab Dept ([facility]) [phone]  04/29/2024   [facility] 04/29/2024, 6:02 PM

## 2024-04-29 NOTE — Hospital Course (Addendum)
 73yo with h/o DM, HTN, stage 3 CKD, and COPD with recent admission for n/v associated with Mounjaro  (continued use) and COVID who presented on 10/7 with abdominal pain.  CT with ileus.  Has been very slow to respond but Mounjaro  is likely mostly out of her system now and ileus has resolved.  Surgery and GI have signed off.  Marked AKI with hypernatremia, nephrology helpful in correcting.

## 2024-04-29 NOTE — TOC Initial Note (Signed)
 Transition of Care [facility]) - Initial/Assessment Note    Patient Details  Name: Brooke [name] MRN: [id number] Date of Birth: [birth year]  Transition of Care [facility]) CM/SW Contact:    Doneta Glenys DASEN, RN Phone Number: 04/29/2024, 4:42 PM  Clinical Narrative:                 MOON completed.Presented for N/V. PTA lives in a house with son and daughter;verified PCP/insurance;DME-walker;denies HH;oxygen  or SDOH needs; Family will transport at this. No Inpatient Care Management needs identified. Place consult if needs present.  Expected Discharge Plan: Home/Self Care Barriers to Discharge: No Barriers Identified   Patient Goals and CMS Choice Patient states their goals for this hospitalization and ongoing recovery are:: Home CMS Medicare.gov Compare Post Acute Care list provided to::  (NA) Choice offered to / list presented to : NA [place] ownership interest in [facility].provided to:: Parent NA    Expected Discharge Plan and Services In-house Referral: NA Discharge Planning Services: CM Consult   Living arrangements for the past 2 months: Single Family Home                 DME Arranged: N/A DME Agency: NA       HH Arranged: NA HH Agency: NA        Prior Living Arrangements/Services Living arrangements for the past 2 months: Single Family Home Lives with:: Adult Children Patient language and need for interpreter reviewed:: Yes Do you feel safe going back to the place where you live?: Yes      Need for Family Participation in Patient Care: Yes (Comment) Care giver support system in place?: Yes (comment) Current home services: DME (walker) Criminal Activity/Legal Involvement Pertinent to Current Situation/Hospitalization: No - Comment as needed  Activities of Daily Living   ADL Screening (condition at time of admission) Independently performs ADLs?: Yes (appropriate for developmental age) Is the patient deaf or have difficulty hearing?: No Does the patient  have difficulty seeing, even when wearing glasses/contacts?: No Does the patient have difficulty concentrating, remembering, or making decisions?: No  Permission Sought/Granted Permission sought to share information with : Case Manager Permission granted to share information with : Yes, Verbal Permission Granted  Share Information with NAME: KACY, HEGNA (Son)  [phone]           Emotional Assessment Appearance:: Appears stated age Attitude/Demeanor/Rapport: Engaged Affect (typically observed): Accepting, Appropriate Orientation: : Oriented to Self, Oriented to Place, Oriented to  Time, Oriented to Situation Alcohol / Substance Use: Not Applicable Psych Involvement: No (comment)  Admission diagnosis:  Ileus (HCC) [K56.7] Abdominal pain, unspecified abdominal location [R10.9] Patient Active Problem List   Diagnosis Date Noted   Ileus (HCC) 04/28/2024   Abdominal pain 04/28/2024   Abnormal CT of the abdomen 04/28/2024   Nausea & vomiting 04/15/2024   COVID-19 virus infection 04/15/2024   Nausea and vomiting 04/15/2024   Dizziness 04/03/2024   Pneumonia 02/27/2024   UTI (urinary tract infection) 02/27/2024   Pancreatic lesion 02/27/2024   Cerumen impaction 02/27/2024   AKI (acute kidney injury) 02/20/2024   ARF (acute renal failure) 02/18/2024   Scalp pain 06/08/2023   Significant closed head trauma within past 3 months 02/25/2023   Chronic cough 02/25/2023   COPD (chronic obstructive pulmonary disease) (HCC) 09/08/2022   Pseudogout of left wrist 08/08/2022   Generalized weakness 05/18/2022   Left ear impacted cerumen 05/16/2022   Left kidney mass 01/14/2022   Peripheral edema 01/14/2022   Left ear hearing loss  12/05/2021   Nasal crusting 12/04/2021   Left ear pain 08/29/2021   Grief 06/05/2021   Acute shoulder bursitis, left 06/05/2021   Bilateral shoulder pain 03/05/2020   Vitamin D  deficiency 12/01/2019   Allergic rhinitis 06/16/2019   Asthma exacerbation  06/16/2019   Dyspnea 01/27/2019   Primary osteoarthritis of left knee 09/30/2018   CKD (chronic kidney disease) stage 3, GFR 30-59 ml/min (HCC) 07/25/2018   Chronic back pain 07/25/2018   Primary osteoarthritis of right knee 05/05/2018   Osteoarthritis of right knee 04/30/2018   Degenerative arthritis of knee, bilateral 08/07/2017   Depression with anxiety 07/19/2017   Vertigo 01/12/2017   Low back pain 01/12/2017   SOB (shortness of breath) 12/02/2016   Dysuria 09/12/2016   Bilateral knee pain 02/15/2015   Effusion of right knee 01/28/2015   Asthma with exacerbation 11/09/2014   Pronation deformity of both feet 09/29/2014   Greater trochanteric bursitis of left hip 08/31/2014   Degenerative arthritis of left knee 05/18/2014   Ventricular tachycardia, polymorphic (HCC) 01/04/2014   Abnormal MRI of head 12/04/2013   Syncope 11/06/2013   Chest pain 11/06/2013   Headache 11/06/2013   Nausea with vomiting 11/06/2013   Hypokalemia 08/18/2012   Diastolic dysfunction 01/15/2011   Leukocytosis 01/03/2011   Toe pain 12/20/2010   Encounter for well adult exam with abnormal findings 12/19/2010   FATIGUE 09/05/2009   Diabetes mellitus with chronic kidney disease (HCC) 11/29/2008   Gout 08/09/2008   Normochromic normocytic anemia 02/09/2008   GERD 02/09/2008   ALOPECIA 12/30/2007   Hyperlipidemia 10/01/2007   Essential hypertension 02/26/2007   History of colonic polyps 02/26/2007   Goiter 09/19/2006   Asthma 09/19/2006   ECZEMA, ATOPIC DERMATITIS 09/19/2006   Insomnia 09/19/2006   PCP:  Norleen Lynwood ORN, MD Pharmacy:   [facility] [phone] - [place], [place] - [address] AT [facility] OF [address] [place] [address] Phone: [phone] Fax: [phone]  [id number] [place], [place] - [address] AT [facility] OF [address] [place] [address] Phone: [phone] Fax: [phone]     Social Drivers  of Health (SDOH) Social History: SDOH Screenings   Food Insecurity: No Food Insecurity (04/28/2024)  Housing: Low Risk  (04/28/2024)  Transportation Needs: No Transportation Needs (04/28/2024)  Utilities: Not At Risk (04/28/2024)  Alcohol Screen: Low Risk  (09/24/2023)  Depression (PHQ2-9): Low Risk  (04/22/2024)  Financial Resource Strain: Low Risk  (09/24/2023)  Physical Activity: Insufficiently Active (09/24/2023)  Social Connections: Moderately Isolated (04/28/2024)  Stress: No Stress Concern Present (09/24/2023)  Tobacco Use: Low Risk  (04/28/2024)  Health Literacy: Adequate Health Literacy (09/24/2023)   SDOH Interventions:     Readmission Risk Interventions    04/16/2024    9:45 AM  Readmission Risk Prevention Plan  Transportation Screening Complete  PCP or Specialist Appt within 3-5 Days Complete  HRI or Home Care Consult Complete  Social Work Consult for Recovery Care Planning/Counseling Complete  Palliative Care Screening Not Applicable  Medication Review Oceanographer) Complete

## 2024-04-29 NOTE — Progress Notes (Signed)
 Patient complaining of nausea. RN administered IV zofran  at 0144. No other medication available to be administered at this time.  RN notified Lynwood Kipper, NP of the above information via secure chat.   Orders Received: IV compazine (See MAR)

## 2024-04-29 NOTE — Progress Notes (Signed)
 MEWS Progress Note  Patient Details Name: LAVETTE YANKOVICH MRN: [id number] DOB: 1951/04/04 Today's Date: 04/29/2024   MEWS Flowsheet Documentation:  Assess: MEWS Score Temp: 98.8 F (37.1 C) BP: (!) 157/84 MAP (mmHg): 104 Pulse Rate: (!) 114 Resp: 17 Level of Consciousness: Alert SpO2: 100 % O2 Device: Room Air Assess: MEWS Score MEWS Temp: 0 MEWS Systolic: 0 MEWS Pulse: 2 MEWS RR: 0 MEWS LOC: 0 MEWS Score: 2 MEWS Score Color: Yellow Assess: SIRS CRITERIA SIRS Temperature : 0 SIRS Respirations : 0 SIRS Pulse: 1 SIRS WBC: 0 SIRS Score Sum : 1 Assess: if the MEWS score is Yellow or Red Were vital signs accurate and taken at a resting state?: Yes Does the patient meet 2 or more of the SIRS criteria?: No MEWS guidelines implemented : No, previously yellow, continue vital signs every 4 hours Notify: Charge Nurse/RN Name of Charge Nurse/RN Notified: Melvyn, RN      Jori LITTIE Eck 04/29/2024, 8:20 PM

## 2024-04-29 NOTE — Plan of Care (Signed)
  Problem: Clinical Measurements: Goal: Respiratory complications will improve Outcome: Progressing Goal: Cardiovascular complication will be avoided Outcome: Progressing   Problem: Activity: Goal: Risk for activity intolerance will decrease Outcome: Progressing   Problem: Elimination: Goal: Will not experience complications related to bowel motility Outcome: Progressing Goal: Will not experience complications related to urinary retention Outcome: Progressing   Problem: Pain Managment: Goal: General experience of comfort will improve and/or be controlled Outcome: Progressing   Problem: Safety: Goal: Ability to remain free from injury will improve Outcome: Progressing

## 2024-04-29 NOTE — Progress Notes (Signed)
 Progress Note   Patient: Brooke [name] FMW:[id number] DOB: 01/05/1951 DOA: 04/27/2024     0 DOS: the patient was seen and examined on 04/29/2024   Brief hospital course: 73yo with h/o DM, HTN, stage 3 CKD, and COPD with recent admission for n/v associated with Mounjaro  (continued use) and COVID who presented on 10/7 with abdominal pain.  CT with ileus.  Assessment and Plan:  Ileus  Multiple days of abdominal pain, decreased po intake following recent admission for colitis in setting of mounjaro  use  CT imaging concerning for ileus  Noted recent Mounjaro  use 10/6 with rapid worsening of abd pain and nausea  NPO  IVF  Pain control and antiemetics  HOLD Mounjaro  GI consulting Patient was placed on IV fluid hydration NG tube placed to LIWS today    Chest pain Nonspecific chest pain over multiple days Baseline history of cardiac evaluation including Cath 2015 showed normal coronary arteries Her PMVT was felt to be triggered by PVCs Repeat nuclear stress test 12/2016 for CP showed small moderate intensity anteroapical defect that was fixed and felt consistent with apical thinning, low risk study, EF 62%.  EKG w/ nonspecific changes  Trop negative x 2  ? Some element of overlapping GI assd pain in setting of ileus  Echo with preserved EF, grade 1 DD, no WMA Concern for LVOT obstruction on echo Will consult cardiology   Recent COVID-19 infection  No hypoxia or shortness of breath during last hospitalization Test + on 9/23 so no indication for precautions at this time   Hypertension Continue metoprolol , Cardizem    CKD stage IIIb Appears to be stable at this time Attempt to avoid nephrotoxic medications Recheck BMP in AM  Placed on sodium bicarb tabs 650 mg twice daily for 1 week during last hospitalization and now back to CO2 16 - will resume   COPD Stable, no active wheezing Continue Trelegy   Diabetes mellitus type 2, NIDDM Last A1c 6.2 on 8/6, good control Hold  Jardiance  STOP Mounjaro  Start sensitive-scale sliding scale insulin    Depression Continue Celexa , trazodone    Chronic anemia, normocytic Baseline hemoglobin 9-10 Currently at baseline   Hyperlipidemia Continue statin   History of glaucoma Continue outpatient eyedrops    Class 1 obesity Body mass index is 31.53 kg/m.SABRA  Weight loss should be encouraged Outpatient PCP/bariatric medicine f/u encouraged Significantly low or high BMI is associated with higher medical risk including morbidity and mortality     Consultants: GI PT  Procedures: None  Antibiotics: None  30 Day Unplanned Readmission Risk Score    Flowsheet Row ED to Hosp-Admission (Discharged) from [facility]14/20[facility] in [facility]  30 Day Unplanned Readmission Risk Score (%) 22.08 Filed at 04/16/2024 0801    This score is the patient's risk of an unplanned readmission within 30 days of being discharged (0 -100%). The score is based on dignosis, age, lab data, medications, orders, and past utilization.   Low:  0-14.9   Medium: 15-21.9   High: 22-29.9   Extreme: 30 and above           Subjective: Still with nausea, requesting NG tube placement.   Objective: Vitals:   04/29/24 1235 04/29/24 1612  BP: (!) 189/90 (!) 167/72  Pulse:  (!) 121  Resp:    Temp:    SpO2:      Intake/Output Summary (Last 24 hours) at 04/29/2024 1728 Last data filed at 04/29/2024 0153 Gross per 24 hour  Intake 920.78  ml  Output --  Net 920.78 ml   Filed Weights   04/28/24 0912  Weight: 80.7 kg    Exam:  General:  Appears calm but uncomfortable and is in NAD Eyes:  normal lids, iris ENT:  grossly normal hearing, lips & tongue, mmm Cardiovascular:  RRR. No LE edema.  Respiratory:   CTA bilaterally with no wheezes/rales/rhonchi.  Normal respiratory effort. Abdomen:  soft, diffuse abdominal TTP, mild distention Skin:  no rash or induration seen on limited exam Musculoskeletal:   grossly normal tone BUE/BLE, good ROM, no bony abnormality Psychiatric:  blunted mood and affect, speech fluent and appropriate, AOx3 Neurologic:  CN 2-12 grossly intact, moves all extremities in coordinated fashion  Data Reviewed: I have reviewed the patient's lab results since admission.  Pertinent labs for today include:   Na++ 147 CO2 16 BUN 24/Creatinine 1.49/GFR 37, stable WBC 6.3 Hgb 10.3     Family Communication: None present     Code Status: Full Code    Disposition: Status is: Inpatient Admit - It is my clinical opinion that admission to INPATIENT is reasonable and necessary because of the expectation that this patient will require hospital care that crosses at least 2 midnights to treat this condition based on the medical complexity of the problems presented.  Given the aforementioned information, the predictability of an adverse outcome is felt to be significant.      Time spent: 50 minutes  Unresulted Labs (From admission, onward)     Start     Ordered   Unscheduled  CBC with Differential/Platelet  Tomorrow morning,   R        04/29/24 1728   Unscheduled  Basic metabolic panel with GFR  Tomorrow morning,   R        04/29/24 1728             Author: Delon Herald, MD 04/29/2024 5:28 PM  For on call review [email or web address].

## 2024-04-30 ENCOUNTER — Inpatient Hospital Stay (HOSPITAL_COMMUNITY)

## 2024-04-30 DIAGNOSIS — R109 Unspecified abdominal pain: Secondary | ICD-10-CM | POA: Diagnosis not present

## 2024-04-30 DIAGNOSIS — Z4682 Encounter for fitting and adjustment of non-vascular catheter: Secondary | ICD-10-CM | POA: Diagnosis not present

## 2024-04-30 DIAGNOSIS — R935 Abnormal findings on diagnostic imaging of other abdominal regions, including retroperitoneum: Secondary | ICD-10-CM | POA: Diagnosis not present

## 2024-04-30 DIAGNOSIS — K567 Ileus, unspecified: Secondary | ICD-10-CM | POA: Diagnosis not present

## 2024-04-30 LAB — GLUCOSE, CAPILLARY
Glucose-Capillary: 102 mg/dL — ABNORMAL HIGH (ref 70–99)
Glucose-Capillary: 104 mg/dL — ABNORMAL HIGH (ref 70–99)
Glucose-Capillary: 108 mg/dL — ABNORMAL HIGH (ref 70–99)
Glucose-Capillary: 113 mg/dL — ABNORMAL HIGH (ref 70–99)
Glucose-Capillary: 114 mg/dL — ABNORMAL HIGH (ref 70–99)
Glucose-Capillary: 118 mg/dL — ABNORMAL HIGH (ref 70–99)

## 2024-04-30 LAB — BASIC METABOLIC PANEL WITH GFR
Anion gap: 16 — ABNORMAL HIGH (ref 5–15)
Anion gap: 15 (ref 5–15)
BUN: 26 mg/dL — ABNORMAL HIGH (ref 8–23)
BUN: 27 mg/dL — ABNORMAL HIGH (ref 8–23)
CO2: 15 mmol/L — ABNORMAL LOW (ref 22–32)
CO2: 17 mmol/L — ABNORMAL LOW (ref 22–32)
Calcium: 8.9 mg/dL (ref 8.9–10.3)
Calcium: 9 mg/dL (ref 8.9–10.3)
Chloride: 121 mmol/L — ABNORMAL HIGH (ref 98–111)
Chloride: 124 mmol/L — ABNORMAL HIGH (ref 98–111)
Creatinine, Ser: 1.65 mg/dL — ABNORMAL HIGH (ref 0.44–1.00)
Creatinine, Ser: 1.76 mg/dL — ABNORMAL HIGH (ref 0.44–1.00)
GFR, Estimated: 33 mL/min — ABNORMAL LOW (ref >60)
GFR, Estimated: 30 mL/min — ABNORMAL LOW (ref >60)
Glucose, Bld: 104 mg/dL — ABNORMAL HIGH (ref 70–99)
Glucose, Bld: 106 mg/dL — ABNORMAL HIGH (ref 70–99)
Potassium: 3 mmol/L — ABNORMAL LOW (ref 3.5–5.1)
Potassium: 3.1 mmol/L — ABNORMAL LOW (ref 3.5–5.1)
Sodium: 152 mmol/L — ABNORMAL HIGH (ref 135–145)
Sodium: 155 mmol/L — ABNORMAL HIGH (ref 135–145)

## 2024-04-30 LAB — CBC WITH DIFFERENTIAL/PLATELET
Band Neutrophils: 15 %
Basophils Absolute: 0 K/uL (ref 0.0–0.1)
Basophils Relative: 0 %
Eosinophils Absolute: 0 K/uL (ref 0.0–0.5)
Eosinophils Relative: 0 %
HCT: 34 % — ABNORMAL LOW (ref 36.0–46.0)
Hemoglobin: 10.7 g/dL — ABNORMAL LOW (ref 12.0–15.0)
Lymphocytes Relative: 25 %
Lymphs Abs: 1.2 K/uL (ref 0.7–4.0)
MCH: 27.1 pg (ref 26.0–34.0)
MCHC: 31.5 g/dL (ref 30.0–36.0)
MCV: 86.1 fL (ref 80.0–100.0)
Monocytes Absolute: 0.1 K/uL (ref 0.1–1.0)
Monocytes Relative: 3 %
Neutro Abs: 3.5 K/uL (ref 1.7–7.7)
Neutrophils Relative %: 57 %
Platelets: 258 K/uL (ref 150–400)
RBC: 3.95 MIL/uL (ref 3.87–5.11)
RDW: 19.8 % — ABNORMAL HIGH (ref 11.5–15.5)
WBC: 4.8 K/uL (ref 4.0–10.5)
nRBC: 0 % (ref 0.0–0.2)

## 2024-04-30 MED ORDER — POTASSIUM CHLORIDE CRYS ER 20 MEQ PO TBCR: 40.0000 meq | EXTENDED_RELEASE_TABLET | Freq: Once | ORAL | Status: DC

## 2024-04-30 MED ORDER — LACTATED RINGERS IV SOLN: INTRAVENOUS | Status: DC

## 2024-04-30 MED ORDER — LACTATED RINGERS IV BOLUS
1000.0000 mL | Freq: Once | INTRAVENOUS | Status: DC
Start: 2024-04-30 — End: 2024-05-05

## 2024-04-30 MED ORDER — MORPHINE SULFATE (PF) 2 MG/ML IV SOLN
2.0000 mg | Freq: Once | INTRAVENOUS | Status: AC
  Administered 2024-04-30: 2 mg via INTRAVENOUS
  Filled 2024-04-30: qty 1

## 2024-04-30 MED ORDER — POTASSIUM CHLORIDE 20 MEQ PO PACK
40.0000 meq | PACK | Freq: Once | ORAL | Status: AC
  Administered 2024-04-30: 40 meq
  Filled 2024-04-30: qty 2

## 2024-04-30 MED ORDER — POTASSIUM CHLORIDE 10 MEQ/100ML IV SOLN
10.0000 meq | INTRAVENOUS | Status: AC
  Administered 2024-04-30 (×4): 10 meq via INTRAVENOUS
  Filled 2024-04-30 (×4): qty 100

## 2024-04-30 NOTE — Plan of Care (Signed)
  Problem: Education: Goal: Knowledge of General Education information will improve Description: Including pain rating scale, medication(s)/side effects and non-pharmacologic comfort measures Outcome: Progressing   Problem: Clinical Measurements: Goal: Ability to maintain clinical measurements within normal limits will improve Outcome: Progressing Goal: Will remain free from infection Outcome: Progressing   Problem: Pain Managment: Goal: General experience of comfort will improve and/or be controlled Outcome: Progressing   Problem: Coping: Goal: Ability to adjust to condition or change in health will improve Outcome: Progressing

## 2024-04-30 NOTE — Progress Notes (Signed)
 Lost IV access this morning. RN unable to start new IV. RN placed IV team consult STAT, waiting for IV placement.

## 2024-04-30 NOTE — Progress Notes (Signed)
 Latest Reference Range & Units 04/30/24 04:31  Sodium 135 - 145 mmol/L 152 (H)  Potassium 3.5 - 5.1 mmol/L 3.0 (L)  Chloride 98 - 111 mmol/L 121 (H)  CO2 22 - 32 mmol/L 15 (L)  Glucose 70 - 99 mg/dL 895 (H)  BUN 8 - 23 mg/dL 26 (H)  Creatinine 9.55 - 1.00 mg/dL 8.34 (H)  Calcium  8.9 - 10.3 mg/dL 8.9  Anion gap 5 - 15  16 (H)   RN notified Lavanda Horns, NP of the above information.   Orders Received: Stop IVF

## 2024-04-30 NOTE — Plan of Care (Signed)
°  Problem: Clinical Measurements: Goal: Respiratory complications will improve Outcome: Progressing Goal: Cardiovascular complication will be avoided Outcome: Progressing   Problem: Elimination: Goal: Will not experience complications related to bowel motility Outcome: Progressing   

## 2024-04-30 NOTE — Progress Notes (Addendum)
 Progress Note   Patient: Brooke [name] FMW:[id number] DOB: [birth year] DOA: 04/27/2024     1 DOS: the patient was seen and examined on 04/30/2024   Brief hospital course: 73yo with h/o DM, HTN, stage 3 CKD, and COPD with recent admission for n/v associated with Mounjaro  (continued use) and COVID who presented on 10/7 with abdominal pain.  CT with ileus.  Assessment and Plan:  Ileus  Multiple days of abdominal pain, decreased po intake following recent admission for colitis in setting of mounjaro  use  CT imaging concerning for ileus  Noted recent Mounjaro  use 10/6 with rapid worsening of abd pain and nausea  NPO  IVF  Pain control and antiemetics  HOLD Mounjaro  GI consulting Patient was placed on IV fluid hydration NG tube placed to LIWS  Xray today continues to show a focal ilues  Hypernatremia Suspect that this is hypovolemic with output outpacing input Given bolus without improvement Will increase IVF rate overnight Recheck in AM   Chest pain Nonspecific chest pain over multiple days Baseline history of cardiac evaluation including Cath 2015 showed normal coronary arteries Her PMVT was felt to be triggered by PVCs Repeat nuclear stress test 12/2016 for CP showed small moderate intensity anteroapical defect that was fixed and felt consistent with apical thinning, low risk study, EF 62%.  EKG w/ nonspecific changes  Trop negative x 2  ? Some element of overlapping GI assd pain in setting of ileus  Echo with preserved EF, grade 1 DD, no WMA Concern for LVOT obstruction on echo Will consult cardiology  Hypokalemia Replete PO and IV, goal is >4 but still normal   Recent COVID-19 infection  No hypoxia or shortness of breath during last hospitalization Test + on 9/23 so no indication for precautions at this time   Hypertension Continue metoprolol , Cardizem    CKD stage IIIb with chronic metabolic acidosis Appears to be stable at this time Attempt to avoid nephrotoxic  medications Recheck BMP in AM  Placed on sodium bicarb tabs 650 mg twice daily for 1 week during last hospitalization and now back to CO2 16 - will resume   COPD Stable, no active wheezing Continue Trelegy   Diabetes mellitus type 2, NIDDM Last A1c 6.2 on 8/6, good control Hold Jardiance  STOP Mounjaro  Start sensitive-scale sliding scale insulin    Depression Continue Celexa , trazodone    Chronic anemia, normocytic Baseline hemoglobin 9-10 Currently at baseline   Hyperlipidemia Continue statin   History of glaucoma Continue outpatient eyedrops   Class 1 obesity Body mass index is 31.53 kg/m.SABRA  Weight loss should be encouraged Outpatient PCP/bariatric medicine f/u encouraged Significantly low or high BMI is associated with higher medical risk including morbidity and mortality        Consultants: GI PT   Procedures: None   Antibiotics: None  30 Day Unplanned Readmission Risk Score    Flowsheet Row ED to Hosp-Admission (Current) from 04/27/2024 in [facility]  30 Day Unplanned Readmission Risk Score (%) 24.59 Filed at 04/30/2024 0801    This score is the patient's risk of an unplanned readmission within 30 days of being discharged (0 -100%). The score is based on dignosis, age, lab data, medications, orders, and past utilization.   Low:  0-14.9   Medium: 15-21.9   High: 22-29.9   Extreme: 30 and above           Subjective: Continues to feel uncomfortable but better.  Sipping fluids now.  No significant flatus.  Objective: Vitals:   04/30/24 0810 04/30/24 1212  BP: (!) 168/95 135/83  Pulse: (!) 119 (!) 117  Resp: 19 18  Temp: 98 F (36.7 C) 98.6 F (37 C)  SpO2: 99% 100%    Intake/Output Summary (Last 24 hours) at 04/30/2024 1700 Last data filed at 04/30/2024 1552 Gross per 24 hour  Intake 3461 ml  Output 360 ml  Net 3101 ml   Filed Weights   04/28/24 0912  Weight: 80.7 kg    Exam:  General:   Appears calm but uncomfortable and is in NAD Eyes:  normal lids, iris ENT:  grossly normal hearing, lips & tongue, mmm; NG tube in place Cardiovascular:  RRR. No LE edema.  Respiratory:   CTA bilaterally with no wheezes/rales/rhonchi.  Normal respiratory effort. Abdomen:  soft, improved diffuse abdominal TTP, improved distention Skin:  no rash or induration seen on limited exam Musculoskeletal:  grossly normal tone BUE/BLE, good ROM, no bony abnormality Psychiatric:  blunted mood and affect, speech fluent and appropriate, AOx3 Neurologic:  CN 2-12 grossly intact, moves all extremities in coordinated fashion  Data Reviewed: I have reviewed the patient's lab results since admission.  Pertinent labs for today include:   Na++ 152 -> 155 K+ 3.0 CO2 15 -> 17 BUN 26/Creatinine 1.65/GFR 33 -> 27/1.76/30 Anion gap 16 WBC 4.8 Hgb 10.7    Family Communication: Sister was present  Mobility: PT/OT Consulted and are recommending - Home Health Pt10/02/2024 1800    Code Status: Full Code   Disposition: Status is: Inpatient Remains inpatient appropriate because: ongoing management     Time spent: 50 minutes  Unresulted Labs (From admission, onward)     Start     Ordered   05/01/24 0500  Basic metabolic panel with GFR  Tomorrow morning,   R        04/30/24 0841   05/01/24 0500  CBC with Differential/Platelet  Tomorrow morning,   R        04/30/24 9158             Author: Delon Herald, MD 04/30/2024 5:00 PM  For on call review [email or web address].

## 2024-04-30 NOTE — Progress Notes (Addendum)
 [facility] Progress Note  CC:   Ileus   Subjective: Feeling okay, not great.  She did walk with PT.  No passing flatus yet and no BM despite dulcolax suppository x 2.  Abdominal x-ray seems to look better today though.  Wants something to drink.  Objective:  Vital signs in last 24 hours: Temp:  [98 F (36.7 C)-99.9 F (37.7 C)] 98 F (36.7 C) (10/09 0810) Pulse Rate:  [111-121] 119 (10/09 0810) Resp:  [14-19] 19 (10/09 0810) BP: (154-200)/(72-95) 168/95 (10/09 0810) SpO2:  [97 %-100 %] 99 % (10/09 0810) Last BM Date : 04/13/24 General:  Alert, Well-developed, in NAD Heart:  Tachy. Pulm:  CTAB.  No W/R/R. Abdomen:  Soft, slightly distended.  BS not heard well.  Mild diffuse TTP. Extremities:  Without edema. Neurologic:  Alert and oriented x 4;  grossly normal neurologically.  Intake/Output from previous day: 10/08 0701 - 10/09 0700 In: 861.7 [I.V.:771.7; NG/GT:90] Out: 360 [Emesis/NG output:360]  Lab Results: Recent Labs    04/27/24 2256 04/29/24 1047 04/30/24 0431  WBC 15.8* 6.3 4.8  HGB 11.5* 10.3* 10.7*  HCT 38.4 33.2* 34.0*  PLT 347 258 258   BMET Recent Labs    04/27/24 2256 04/29/24 1047 04/30/24 0431  NA 139 147* 152*  K 3.8 3.5 3.0*  CL 110 116* 121*  CO2 16* 16* 15*  GLUCOSE 111* 96 104*  BUN 20 24* 26*  CREATININE 1.77* 1.49* 1.65*  CALCIUM  9.6 8.7* 8.9   LFT Recent Labs    04/27/24 2256  PROT 6.7  ALBUMIN 3.5  AST 17  ALT 12  ALKPHOS 124  BILITOT 0.3   DG Abd 1 View Result Date: 04/30/2024 EXAM: 1 VIEW XRAY OF THE ABDOMEN 04/30/2024 05:20:00 AM COMPARISON: 04/29/2024 CLINICAL HISTORY: Ileus (HCC) 01250. ileus FINDINGS: LINES, TUBES AND DEVICES: Enteric tube is in place with tip in side port below the GE junction. The tube has been slightly retracted with the side hole approximately 2 cm below the GE junction. Left renal artery stent noted. BOWEL: Single dilated loop of small bowel within the left upper quadrant, up to  3.8 cm in diameter. SOFT TISSUES: Right upper quadrant surgical clips noted. No opaque urinary calculi. BONES: No acute osseous abnormality. IMPRESSION: 1. Single dilated loop of small bowel in the left abdomen measuring up to 3.8 cm, suggestive of focal ileus. 2. Slight retraction of the enteric tube. The side hole for the tube is 2 cm below the ge junction. Electronically signed by: Waddell Calk MD 04/30/2024 07:46 AM EDT RP Workstation: [id number]   DG Abd 1 View Result Date: 04/29/2024 CLINICAL DATA:  NG tube placement. EXAM: ABDOMEN - 1 VIEW COMPARISON:  Same day abdominal radiograph dated 04/29/2024 at 9:44 a.m. FINDINGS: Enteric tube tip within the stomach. Visualized lung bases are clear. IMPRESSION: Enteric tube tip within the stomach. Electronically Signed   By: Harrietta Sherry M.D.   On: 04/29/2024 17:04   ECHOCARDIOGRAM COMPLETE Result Date: 04/29/2024    ECHOCARDIOGRAM REPORT   Patient Name:   Brooke [name] [facility] Date of Exam: 04/29/2024 Medical Rec #:  [id number]      Height:       63.0 in Accession #:    [id number]     Weight:       178.0 lb Date of Birth:  13-Jun-1951      BSA:          1.840 m Patient Age:    73 years  BP:           177/81 mmHg Patient Gender: F              HR:           119 bpm. Exam Location:  Inpatient Procedure: 2D Echo, Cardiac Doppler and Color Doppler (Both Spectral and Color            Flow Doppler were utilized during procedure). Indications:    Chest Pain R07.9  History:        Patient has prior history of Echocardiogram examinations, most                 recent 11/10/2021. COPD, Signs/Symptoms:Chest Pain; Risk                 Factors:Diabetes and Hypertension. H/O Chronic kidney disease.  Sonographer:    Bernarda Rocks Referring Phys: [phone] STEVEN J NEWTON IMPRESSIONS  1. Left ventricular ejection fraction, by estimation, is 70 to 75%. The left ventricle has hyperdynamic function. The left ventricle has no regional wall motion abnormalities. There is mild left  ventricular hypertrophy. Left ventricular diastolic parameters are consistent with Grade I diastolic dysfunction (impaired relaxation).  2. Findings concerning for LVOT obstruction. Details below.  3. Right ventricular systolic function is normal. The right ventricular size is normal. Mildly increased right ventricular wall thickness. There is moderately elevated pulmonary artery systolic pressure.  4. The mitral valve is normal in structure. No evidence of mitral valve regurgitation. No evidence of mitral stenosis.  5. The aortic valve is normal in structure. Aortic valve regurgitation is not visualized. No aortic stenosis is present.  6. The inferior vena cava is normal in size with greater than 50% respiratory variability, suggesting right atrial pressure of 3 mmHg. Comparison(s): A prior study was performed on 11/10/2021. LV outflow tract gradient now elevated. FINDINGS  Left Ventricle: There is a mid cavitary gradient of 41 mmHg at rest and was 39 mmHg with valsalva but in a different location and may be underestimated. This is may be concerning for LVOT obstruction. Left ventricular ejection fraction, by estimation, is 70 to 75%. The left ventricle has hyperdynamic function. The left ventricle has no regional wall motion abnormalities. The left ventricular internal cavity size was small. There is mild left ventricular hypertrophy. Left ventricular diastolic parameters are consistent with Grade I diastolic dysfunction (impaired relaxation). Right Ventricle: The right ventricular size is normal. Mildly increased right ventricular wall thickness. Right ventricular systolic function is normal. There is moderately elevated pulmonary artery systolic pressure. The tricuspid regurgitant velocity is 3.39 m/s, and with an assumed right atrial pressure of 3 mmHg, the estimated right ventricular systolic pressure is 49.0 mmHg. Left Atrium: Left atrial size was normal in size. Right Atrium: Right atrial size was normal in  size. Pericardium: Trivial pericardial effusion is present. The pericardial effusion is anterior to the right ventricle. Mitral Valve: The mitral valve is normal in structure. No evidence of mitral valve regurgitation. No evidence of mitral valve stenosis. MV peak gradient, 9.1 mmHg. The mean mitral valve gradient is 4.0 mmHg. Tricuspid Valve: The tricuspid valve is normal in structure. Tricuspid valve regurgitation is trivial. No evidence of tricuspid stenosis. Aortic Valve: The aortic valve is normal in structure. Aortic valve regurgitation is not visualized. No aortic stenosis is present. Aortic valve mean gradient measures 13.0 mmHg. Aortic valve peak gradient measures 27.2 mmHg. Aortic valve area, by VTI measures 1.60 cm. Pulmonic Valve: The pulmonic valve was normal in  structure. Pulmonic valve regurgitation is not visualized. No evidence of pulmonic stenosis. Aorta: The aortic root is normal in size and structure. Venous: The inferior vena cava is normal in size with greater than 50% respiratory variability, suggesting right atrial pressure of 3 mmHg. IAS/Shunts: No atrial level shunt detected by color flow Doppler.  LEFT VENTRICLE PLAX 2D LVIDd:         2.70 cm     Diastology LVIDs:         1.70 cm     LV e' medial:    6.68 cm/s LV PW:         1.20 cm     LV E/e' medial:  15.4 LV IVS:        1.20 cm     LV e' lateral:   8.55 cm/s LVOT diam:     1.50 cm     LV E/e' lateral: 12.0 LV SV:         59 LV SV Index:   32 LVOT Area:     1.77 cm  LV Volumes (MOD) LV vol d, MOD A2C: 80.9 ml LV vol d, MOD A4C: 79.1 ml LV vol s, MOD A2C: 22.3 ml LV vol s, MOD A4C: 32.1 ml LV SV MOD A2C:     58.6 ml LV SV MOD A4C:     79.1 ml LV SV MOD BP:      55.5 ml RIGHT VENTRICLE             IVC RV Basal diam:  2.80 cm     IVC diam: 1.40 cm RV S prime:     27.10 cm/s TAPSE (M-mode): 2.1 cm RVSP:           49.0 mmHg LEFT ATRIUM             Index        RIGHT ATRIUM           Index LA diam:        3.20 cm 1.74 cm/m   RA Pressure:  3.00 mmHg LA Vol (A2C):   31.9 ml 17.34 ml/m  RA Area:     8.39 cm LA Vol (A4C):   49.4 ml 26.84 ml/m  RA Volume:   14.80 ml  8.04 ml/m LA Biplane Vol: 41.2 ml 22.39 ml/m  AORTIC VALVE                     PULMONIC VALVE AV Area (Vmax):    1.58 cm      PV Vmax:          1.85 m/s AV Area (Vmean):   1.51 cm      PV Peak grad:     13.7 mmHg AV Area (VTI):     1.60 cm      PR End Diast Vel: 3.25 msec AV Vmax:           261.00 cm/s AV Vmean:          168.000 cm/s AV VTI:            0.370 m AV Peak Grad:      27.2 mmHg AV Mean Grad:      13.0 mmHg LVOT Vmax:         234.00 cm/s LVOT Vmean:        144.000 cm/s LVOT VTI:          0.335 m LVOT/AV VTI ratio: 0.91  AORTA Ao Root diam: 2.40 cm  Ao Asc diam:  2.80 cm MITRAL VALVE                TRICUSPID VALVE MV Area (PHT): 8.25 cm     TR Peak grad:   46.0 mmHg MV Area VTI:   2.84 cm     TR Vmax:        339.00 cm/s MV Peak grad:  9.1 mmHg     Estimated RAP:  3.00 mmHg MV Mean grad:  4.0 mmHg     RVSP:           49.0 mmHg MV Vmax:       1.51 m/s MV Vmean:      95.8 cm/s    SHUNTS MV Decel Time: 92 msec      Systemic VTI:  0.34 m MV E velocity: 103.00 cm/s  Systemic Diam: 1.50 cm MV A velocity: 158.00 cm/s MV E/A ratio:  0.65 Emeline Calender Electronically signed by Emeline Calender Signature Date/Time: 04/29/2024/3:45:42 PM    Final    DG Abd 1 View Result Date: 04/29/2024 CLINICAL DATA:  Ileus. EXAM: ABDOMEN - 1 VIEW COMPARISON:  CT abdomen and pelvis 04/28/2024 FINDINGS: Bowel gas in the small and large bowel. Small bowel loops measuring up to 3.4 cm. Gas in the rectum. Cholecystectomy clips. Left renal artery stent. No significant calcifications overlying the kidneys or expected course of the ureters. IMPRESSION: 1. Gas within small and large bowel. Findings are nonspecific but could be associated with an ileus. No significant change since 04/28/2024. Electronically Signed   By: Juliene Balder M.D.   On: 04/29/2024 10:22   Assessment / Plan: 73 year old female with complaints  of abdominal pain, nausea, and vomiting for the past 2 weeks.  Was admitted for a couple of days and treated conservatively 2 weeks ago for the same complaints.  CT scan showed suspected mild infectious or inflammatory colitis in the descending colon, but she was not having diarrhea.  Abdominal pain recurred and she has been vomiting significantly so returned to the hospital.  CT scan now shows diffuse small bowel ileus.  She is on Mounjaro  and that dose was recently increased about 3 weeks ago.  She only skipped 1 dose, just took her dose again 10/6. *Hypokalemia: Likely from GI losses.  Potassium is 2.8, which had been corrected but now is 3.0 again today *Leukocytosis at 15.8 K: Likely reactive.  Improved/resolved at 4.8K today. *COPD, stable *Chronic kidney disease stage III: Kidney function about at baseline. *Type 2 diabetes mellitus   - NGT placement to LIS. - Keep potassium greater than 4 if possible to optimize bowel/gut function.  Trend labs. - Likely should discontinue Mounjaro  use as that can cause ileus. - IV fluids, antiemetics.  Pain medications to be used conservatively as those can slow gut function.  Will allow sips of soda for comfort. -Encouraged her to keep working with PT, moving in bed, getting up to the chair, etc, to help stimulate her gut. -Abdominal x-ray again in AM.    LOS: 1 day   Brooke [name]. Zehr  04/30/2024, 8:55 AM  GI ATTENDING  Interval history and data reviewed.  Agree with interval progress note as outlined above.  The patient is stable.  X-ray with focal ileus.  NG remains in place.  Okay to have limited liquids.  Would try to avoid narcotic pain medications.  Continue to monitor electrolytes.  It would be ideal to keep potassium in the normal range but greater than  or equal to 4.  Repeat x-rays in AM.  Will follow.  Brooke [name]. Abran Raddle., M.D. [facility] Division of Gastroenterology

## 2024-04-30 NOTE — Progress Notes (Signed)
 Mobility Specialist - Progress Note   04/30/24 1003  Mobility  Activity Ambulated with assistance  Level of Assistance Standby assist, set-up cues, supervision of patient - no hands on  Assistive Device Front wheel walker  Distance Ambulated (ft) 80 ft  Range of Motion/Exercises Active  Activity Response Tolerated well  Mobility Referral Yes  Mobility visit 1 Mobility  Mobility Specialist Start Time (ACUTE ONLY) 0950  Mobility Specialist Stop Time (ACUTE ONLY) 1003  Mobility Specialist Time Calculation (min) (ACUTE ONLY) 13 min   Pt was found sitting EOB and agreeable to mobilize. C/o of abdominal pain. At EOS returned to sit EOB with all needs met. Call bell in reach.   Erminio Leos,  Mobility Specialist Can be reached via Secure Chat

## 2024-05-01 ENCOUNTER — Inpatient Hospital Stay (HOSPITAL_COMMUNITY)

## 2024-05-01 DIAGNOSIS — R000 Tachycardia, unspecified: Secondary | ICD-10-CM | POA: Insufficient documentation

## 2024-05-01 DIAGNOSIS — E876 Hypokalemia: Secondary | ICD-10-CM | POA: Diagnosis not present

## 2024-05-01 DIAGNOSIS — R109 Unspecified abdominal pain: Secondary | ICD-10-CM | POA: Diagnosis not present

## 2024-05-01 DIAGNOSIS — N179 Acute kidney failure, unspecified: Secondary | ICD-10-CM | POA: Diagnosis not present

## 2024-05-01 DIAGNOSIS — R011 Cardiac murmur, unspecified: Secondary | ICD-10-CM | POA: Insufficient documentation

## 2024-05-01 DIAGNOSIS — R931 Abnormal findings on diagnostic imaging of heart and coronary circulation: Secondary | ICD-10-CM | POA: Diagnosis not present

## 2024-05-01 DIAGNOSIS — K567 Ileus, unspecified: Secondary | ICD-10-CM | POA: Diagnosis not present

## 2024-05-01 DIAGNOSIS — K56609 Unspecified intestinal obstruction, unspecified as to partial versus complete obstruction: Secondary | ICD-10-CM | POA: Diagnosis not present

## 2024-05-01 DIAGNOSIS — R079 Chest pain, unspecified: Secondary | ICD-10-CM | POA: Diagnosis not present

## 2024-05-01 DIAGNOSIS — E870 Hyperosmolality and hypernatremia: Secondary | ICD-10-CM | POA: Diagnosis not present

## 2024-05-01 DIAGNOSIS — Z4682 Encounter for fitting and adjustment of non-vascular catheter: Secondary | ICD-10-CM | POA: Diagnosis not present

## 2024-05-01 LAB — BASIC METABOLIC PANEL WITH GFR
Anion gap: 13 (ref 5–15)
Anion gap: 13 (ref 5–15)
Anion gap: 10 (ref 5–15)
BUN: 29 mg/dL — ABNORMAL HIGH (ref 8–23)
BUN: 28 mg/dL — ABNORMAL HIGH (ref 8–23)
BUN: 25 mg/dL — ABNORMAL HIGH (ref 8–23)
CO2: 17 mmol/L — ABNORMAL LOW (ref 22–32)
CO2: 18 mmol/L — ABNORMAL LOW (ref 22–32)
CO2: 19 mmol/L — ABNORMAL LOW (ref 22–32)
Calcium: 9 mg/dL (ref 8.9–10.3)
Calcium: 9.3 mg/dL (ref 8.9–10.3)
Calcium: 9 mg/dL (ref 8.9–10.3)
Chloride: 126 mmol/L — ABNORMAL HIGH (ref 98–111)
Chloride: 127 mmol/L — ABNORMAL HIGH (ref 98–111)
Chloride: 129 mmol/L — ABNORMAL HIGH (ref 98–111)
Creatinine, Ser: 1.98 mg/dL — ABNORMAL HIGH (ref 0.44–1.00)
Creatinine, Ser: 2.11 mg/dL — ABNORMAL HIGH (ref 0.44–1.00)
Creatinine, Ser: 1.91 mg/dL — ABNORMAL HIGH (ref 0.44–1.00)
GFR, Estimated: 26 mL/min — ABNORMAL LOW (ref >60)
GFR, Estimated: 24 mL/min — ABNORMAL LOW (ref >60)
GFR, Estimated: 27 mL/min — ABNORMAL LOW (ref >60)
Glucose, Bld: 106 mg/dL — ABNORMAL HIGH (ref 70–99)
Glucose, Bld: 119 mg/dL — ABNORMAL HIGH (ref 70–99)
Glucose, Bld: 91 mg/dL (ref 70–99)
Potassium: 3 mmol/L — ABNORMAL LOW (ref 3.5–5.1)
Potassium: 3.2 mmol/L — ABNORMAL LOW (ref 3.5–5.1)
Potassium: 3.3 mmol/L — ABNORMAL LOW (ref 3.5–5.1)
Sodium: 156 mmol/L — ABNORMAL HIGH (ref 135–145)
Sodium: 158 mmol/L — ABNORMAL HIGH (ref 135–145)
Sodium: 158 mmol/L — ABNORMAL HIGH (ref 135–145)

## 2024-05-01 LAB — MAGNESIUM: Magnesium: 2.2 mg/dL (ref 1.7–2.4)

## 2024-05-01 LAB — CBC WITH DIFFERENTIAL/PLATELET
Abs Immature Granulocytes: 0.08 K/uL — ABNORMAL HIGH (ref 0.00–0.07)
Basophils Absolute: 0 K/uL (ref 0.0–0.1)
Basophils Relative: 1 %
Eosinophils Absolute: 0 K/uL (ref 0.0–0.5)
Eosinophils Relative: 0 %
HCT: 34 % — ABNORMAL LOW (ref 36.0–46.0)
Hemoglobin: 10.5 g/dL — ABNORMAL LOW (ref 12.0–15.0)
Immature Granulocytes: 1 %
Lymphocytes Relative: 15 %
Lymphs Abs: 1 K/uL (ref 0.7–4.0)
MCH: 26.5 pg (ref 26.0–34.0)
MCHC: 30.9 g/dL (ref 30.0–36.0)
MCV: 85.9 fL (ref 80.0–100.0)
Monocytes Absolute: 1.4 K/uL — ABNORMAL HIGH (ref 0.1–1.0)
Monocytes Relative: 22 %
Neutro Abs: 3.9 K/uL (ref 1.7–7.7)
Neutrophils Relative %: 61 %
Platelets: 236 K/uL (ref 150–400)
RBC: 3.96 MIL/uL (ref 3.87–5.11)
RDW: 19.8 % — ABNORMAL HIGH (ref 11.5–15.5)
Smear Review: NORMAL
WBC: 6.4 K/uL (ref 4.0–10.5)
nRBC: 0 % (ref 0.0–0.2)

## 2024-05-01 LAB — GLUCOSE, CAPILLARY
Glucose-Capillary: 106 mg/dL — ABNORMAL HIGH (ref 70–99)
Glucose-Capillary: 121 mg/dL — ABNORMAL HIGH (ref 70–99)
Glucose-Capillary: 81 mg/dL (ref 70–99)
Glucose-Capillary: 126 mg/dL — ABNORMAL HIGH (ref 70–99)

## 2024-05-01 MED ORDER — DIATRIZOATE MEGLUMINE & SODIUM 66-10 % PO SOLN
90.0000 mL | Freq: Once | ORAL | Status: AC
  Administered 2024-05-01: 90 mL via NASOGASTRIC
  Filled 2024-05-01: qty 90

## 2024-05-01 MED ORDER — DEXTROSE 5 % IV SOLN: INTRAVENOUS | Status: AC

## 2024-05-01 MED ORDER — METOPROLOL TARTRATE 5 MG/5ML IV SOLN
5.0000 mg | INTRAVENOUS | Status: DC | PRN
  Administered 2024-05-01 – 2024-05-03 (×3): 5 mg via INTRAVENOUS
  Filled 2024-05-01 (×4): qty 5

## 2024-05-01 MED ORDER — POTASSIUM CHLORIDE 10 MEQ/100ML IV SOLN
10.0000 meq | INTRAVENOUS | Status: AC
Start: 2024-05-01 — End: 2024-05-02
  Administered 2024-05-01 (×4): 10 meq via INTRAVENOUS
  Filled 2024-05-01 (×4): qty 100

## 2024-05-01 MED ORDER — LACTATED RINGERS IV BOLUS
1000.0000 mL | Freq: Once | INTRAVENOUS | Status: AC
  Administered 2024-05-01: 1000 mL via INTRAVENOUS

## 2024-05-01 MED ORDER — LACTATED RINGERS IV BOLUS
500.0000 mL | Freq: Once | INTRAVENOUS | Status: AC
  Administered 2024-05-01: 500 mL via INTRAVENOUS

## 2024-05-01 MED ORDER — PHENOL 1.4 % MT LIQD: 1.0000 | OROMUCOSAL | Status: DC | PRN

## 2024-05-01 MED ORDER — POTASSIUM CHLORIDE 20 MEQ PO PACK
40.0000 meq | PACK | ORAL | Status: AC
  Administered 2024-05-01 (×2): 40 meq
  Filled 2024-05-01 (×2): qty 2

## 2024-05-01 NOTE — Consult Note (Signed)
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 Age:    73 years       BP:           177/81 mmHg Patient Gender: F              HR:           119 bpm. Exam Location:  Inpatient Procedure: 2D Echo, Cardiac Doppler and Color Doppler (Both Spectral and Color            Flow Doppler were utilized during procedure). Indications:    Chest Pain R07.9  History:        Patient has prior history of Echocardiogram examinations, most                 recent 11/10/2021. COPD, Signs/Symptoms:Chest Pain; Risk                 Factors:Diabetes and Hypertension. H/O Chronic kidney disease.  Sonographer:    Bernarda Rocks Referring Phys: [phone] STEVEN J NEWTON IMPRESSIONS  1. Left ventricular ejection fraction, by estimation, is 70 to 75%. The left ventricle has hyperdynamic function. The left ventricle has no regional  wall motion abnormalities. There is mild left ventricular hypertrophy. Left ventricular diastolic parameters are consistent with Grade I diastolic dysfunction (impaired relaxation).  2. Findings concerning for LVOT obstruction. Details below.  3. Right ventricular systolic function is normal. The right ventricular size is normal. Mildly increased right ventricular wall thickness. There is moderately elevated pulmonary artery systolic pressure.  4. The mitral valve is normal in structure. No evidence of mitral valve regurgitation. No evidence of mitral stenosis.  5. The aortic valve is normal in structure. Aortic valve regurgitation is not visualized. No aortic stenosis is present.  6. The inferior vena cava is normal in size with greater than 50% respiratory variability, suggesting right atrial pressure of 3 mmHg. Comparison(s): A prior study was performed on 11/10/2021. LV outflow tract gradient now elevated. FINDINGS  Left Ventricle: There is a mid cavitary gradient of 41 mmHg at rest and was 39 mmHg with valsalva but in a different location and may be underestimated. This is may be concerning for LVOT obstruction. Left ventricular ejection fraction, by estimation, is 70 to 75%. The left ventricle has hyperdynamic function. The left ventricle has no regional wall motion abnormalities. The left ventricular internal cavity size was small. There is mild left ventricular hypertrophy. Left ventricular diastolic parameters are consistent with Grade I diastolic dysfunction (impaired relaxation). Right Ventricle: The right ventricular size is normal. Mildly increased right ventricular wall thickness. Right ventricular systolic function is normal. There is moderately elevated pulmonary artery systolic pressure. The tricuspid regurgitant velocity is 3.39 m/s, and with an assumed right atrial pressure of 3 mmHg, the estimated right ventricular systolic pressure is 49.0 mmHg. Left Atrium: Left atrial size was normal in size.  Right Atrium: Right atrial size was normal in size. Pericardium: Trivial pericardial effusion is present. The pericardial effusion is anterior to the right ventricle. Mitral Valve: The mitral valve is normal in structure. No evidence of mitral valve regurgitation. No evidence of mitral valve stenosis. MV peak gradient, 9.1 mmHg. The mean mitral valve gradient is 4.0 mmHg. Tricuspid Valve: The tricuspid valve is normal in structure. Tricuspid valve regurgitation is trivial. No evidence of tricuspid stenosis. Aortic Valve: The aortic valve is normal in structure. Aortic valve regurgitation is not visualized. No aortic stenosis is present. Aortic valve mean gradient measures 13.0 mmHg. Aortic valve peak gradient measures 27.2 mmHg. Aortic valve area, by VTI measures 1.60 cm. Pulmonic Valve: The pulmonic valve was normal in structure. Pulmonic valve regurgitation is not visualized. No evidence of pulmonic stenosis. Aorta: The aortic root is normal in size and structure. Venous: The inferior vena cava is normal in size with greater than 50% respiratory variability, suggesting right atrial pressure of 3 mmHg. IAS/Shunts: No atrial level shunt detected by color flow Doppler.  LEFT VENTRICLE PLAX 2D LVIDd:         2.70 cm     Diastology LVIDs:  1.70 cm     LV e' medial:    6.68 cm/s LV PW:         1.20 cm     LV E/e' medial:  15.4 LV IVS:        1.20 cm     LV e' lateral:   8.55 cm/s LVOT diam:     1.50 cm     LV E/e' lateral: 12.0 LV SV:         59 LV SV Index:   32 LVOT Area:     1.77 cm  LV Volumes (MOD) LV vol d, MOD A2C: 80.9 ml LV vol d, MOD A4C: 79.1 ml LV vol s, MOD A2C: 22.3 ml LV vol s, MOD A4C: 32.1 ml LV SV MOD A2C:     58.6 ml LV SV MOD A4C:     79.1 ml LV SV MOD BP:      55.5 ml RIGHT VENTRICLE             IVC RV Basal diam:  2.80 cm     IVC diam: 1.40 cm RV S prime:     27.10 cm/s TAPSE (M-mode): 2.1 cm RVSP:           49.0 mmHg LEFT ATRIUM             Index        RIGHT ATRIUM           Index LA  diam:        3.20 cm 1.74 cm/m   RA Pressure: 3.00 mmHg LA Vol (A2C):   31.9 ml 17.34 ml/m  RA Area:     8.39 cm LA Vol (A4C):   49.4 ml 26.84 ml/m  RA Volume:   14.80 ml  8.04 ml/m LA Biplane Vol: 41.2 ml 22.39 ml/m  AORTIC VALVE                     PULMONIC VALVE AV Area (Vmax):    1.58 cm      PV Vmax:          1.85 m/s AV Area (Vmean):   1.51 cm      PV Peak grad:     13.7 mmHg AV Area (VTI):     1.60 cm      PR End Diast Vel: 3.25 msec AV Vmax:           261.00 cm/s AV Vmean:          168.000 cm/s AV VTI:            0.370 m AV Peak Grad:      27.2 mmHg AV Mean Grad:      13.0 mmHg LVOT Vmax:         234.00 cm/s LVOT Vmean:        144.000 cm/s LVOT VTI:          0.335 m LVOT/AV VTI ratio: 0.91  AORTA Ao Root diam: 2.40 cm Ao Asc diam:  2.80 cm MITRAL VALVE                TRICUSPID VALVE MV Area (PHT): 8.25 cm     TR Peak grad:   46.0 mmHg MV Area VTI:   2.84 cm     TR Vmax:        339.00 cm/s MV Peak grad:  9.1 mmHg     Estimated RAP:  3.00 mmHg MV Mean grad:  4.0 mmHg  RVSP:           49.0 mmHg MV Vmax:       1.51 m/s MV Vmean:      95.8 cm/s    SHUNTS MV Decel Time: 92 msec      Systemic VTI:  0.34 m MV E velocity: 103.00 cm/s  Systemic Diam: 1.50 cm MV A velocity: 158.00 cm/s MV E/A ratio:  0.65 Emeline Calender Electronically signed by Emeline Calender Signature Date/Time: 04/29/2024/3:45:42 PM    Final    DG Abd 1 View Result Date: 04/29/2024 CLINICAL DATA:  Ileus. EXAM: ABDOMEN - 1 VIEW COMPARISON:  CT abdomen and pelvis 04/28/2024 FINDINGS: Bowel gas in the small and large bowel. Small bowel loops measuring up to 3.4 cm. Gas in the rectum. Cholecystectomy clips. Left renal artery stent. No significant calcifications overlying the kidneys or expected course of the ureters. IMPRESSION: 1. Gas within small and large bowel. Findings are nonspecific but could be associated with an ileus. No significant change since 04/28/2024. Electronically Signed   By: Juliene Balder M.D.   On: 04/29/2024 10:22   CT  ABDOMEN PELVIS WO CONTRAST Result Date: 04/28/2024 CLINICAL DATA:  Right upper quadrant pain EXAM: CT ABDOMEN AND PELVIS WITHOUT CONTRAST TECHNIQUE: Multidetector CT imaging of the abdomen and pelvis was performed following the standard protocol without IV contrast. RADIATION DOSE REDUCTION: This exam was performed according to the departmental dose-optimization program which includes automated exposure control, adjustment of the mA and/or kV according to patient size and/or use of iterative reconstruction technique. COMPARISON:  04/14/2024 FINDINGS: Lower chest: No acute abnormality. Hepatobiliary: Gallbladder has been surgically removed. Stable cyst is noted within the right lobe of the liver. Pancreas: Unremarkable. No pancreatic ductal dilatation or surrounding inflammatory changes. Spleen: Normal in size without focal abnormality. Adrenals/Urinary Tract: Adrenal glands are within normal limits. Kidneys demonstrate no renal calculi or obstructive changes. Bladder is decompressed. Stomach/Bowel: Scattered diverticular change of the colon is noted. No evidence of diverticulitis is seen. The appendix is within normal limits. Generalized small bowel dilatation is noted which extends to the level of the terminal ileum without obstructing mass. Stomach is decompressed. Vascular/Lymphatic: Aortic atherosclerosis. No enlarged abdominal or pelvic lymph nodes. Reproductive: Status post hysterectomy. No adnexal masses. Other: No abdominal wall hernia or abnormality. No abdominopelvic ascites. Musculoskeletal: No acute or significant osseous findings. IMPRESSION: Generalized small bowel dilatation extending to the terminal ileum. No obstructing mass lesion is seen in these changes may represent a generalized small bowel ileus. Correlate with physical exam. Diverticulosis without diverticulitis. Electronically Signed   By: Oneil Devonshire M.D.   On: 04/28/2024 03:29     Assessment and Plan:  Chest pain  - Patient  previously had cath in 2015 that showed normal coronary arteries. cMRI in 2015 also normal. Coronary CTA in 2019 showed a coronary calcium  score of 0.  - Patient presented with abdominal pain. Reported nonspecific chest pain. hsTn negative x2. EKG with LVH with repolarization abnormality. Similar to previous EKGs  - Echocardiogram showed EF 70-75% with no regional wall motion abnormalities  - Patient denies having chest pain on interview today. No indication for further cardiac workup at this time   Abnormal Echocardiogram  - Echocardiogram showed EF 70-75%, no regional wall motion abnormalities, normal RV systolic function, mildly increased RV wall thickness, moderately elevated PA systolic pressure. There were findings concerning for LVOT obstruction  - Patient currently admitted with ileus. Had been nauseous/vomiting prior to admission and tells me that she was  unable to keep down food or drink for a few days. Denies dizziness, syncope, near syncope   - Labs consistent with dehydration- Na elevated to the 150s, creatinine up top 1.98, BUN 29  - Continue IV fluids  - Resume home metoprolol  succinate 100 mg BID once patient able to tolerate PO meds  - Patient has BMI of 31.53. With elevated PA pressure, recommend outpatient sleep study   HTN  - Resume home metoprolol  when able to take PO medications  Interesting that she is on metoprolol  succinate 100 mg BID and diltiazem  240 mg daily. This is quite a bit of AV nodal blockade. Consider transition to non AV nodal medication   Otherwise per primary  - Ileus  - Recent COVID infection  - CKD stage IIIb  - COPD  - Tyope 2 DM  - Depression  - Chronic anemia   Risk Assessment/Risk Scores:   For questions or updates, please contact [facility] Please consult [email or web address] for contact info under    Signed, Rollo FABIENE Louder, PA-C  05/01/2024 9:48 AM

## 2024-05-01 NOTE — Progress Notes (Signed)
 Progress Note   Patient: Brooke [name] FMW:[id number] DOB: 1950-12-27 DOA: 04/27/2024     2 DOS: the patient was seen and examined on 05/01/2024   Brief hospital course: 73yo with h/o DM, HTN, stage 3 CKD, and COPD with recent admission for n/v associated with Mounjaro  (continued use) and COVID who presented on 10/7 with abdominal pain.  CT with ileus.  Assessment and Plan:  Ileus  vs. SBO Multiple days of abdominal pain, decreased po intake following recent admission for colitis in setting of Mounjaro  use  CT imaging concerning for ileus on presentation Noted recurrent Mounjaro  use on 10/6 after prior dc with rapid worsening of abd pain and nausea  NPO/IVF with pain control and antiemetics  STOP Mounjaro  GI consulting NG tube placed to LIWS, attempted to clamp tube today but she appears to be worsening Xray today shows persistent and progressive SB dilatation - SBO vs. Ileus Surgery consulted, repeat KUB ordered prior to giving gastrograffin   Hypernatremia Suspect that this is hypovolemic with output outpacing input Given bolused without improvement Increased IVF rate overnight without improvement Changed to D5W Will consult nephrology Transfer to progressive care   AKI on CKD stage IIIb with chronic metabolic acidosis Appears to be stable at this time Attempt to avoid nephrotoxic medications Recheck BMP in AM  Placed on sodium bicarb tabs 650 mg twice daily for 1 week during last hospitalization and now back to CO2 16 - resumed H/o L renal artery stent, will order renal US  with Duplex Nephrology consulted   Chest pain Nonspecific chest pain over multiple days Baseline history of cardiac evaluation including Cath 2015 showed normal coronary arteries Her PMVT was felt to be triggered by PVCs Repeat nuclear stress test 12/2016 for CP showed small moderate intensity anteroapical defect that was fixed and felt consistent with apical thinning, low risk study, EF 62%.  EKG w/  nonspecific changes  Trop negative x 2  ? Some element of overlapping GI assd pain in setting of ileus  Echo with preserved EF, grade 1 DD, no WMA Concern for LVOT obstruction on echo Consulted cardiology - will start IV metoprolol  5mg  q2h prn with plan to transition to metoprolol  50 mg po BID when taking PO   Hypokalemia Replete PO and IV, goal is >4 but still normal   Recent COVID-19 infection  No hypoxia or shortness of breath during last hospitalization Test + on 9/23 so no indication for precautions at this time   Hypertension Continue metoprolol , change to IV for now Add prn IV hydralazine   Possible pancreatic mass CT renal study in 01/2023 with 8mm hyperdense region in pancreatic tail Recommended for MRI in 07/2024 Consider imaging sooner if ongoing concerns   COPD Stable, no active wheezing Continue Trelegy   Diabetes mellitus type 2, NIDDM Last A1c 6.2 on 8/6, good control Hold Jardiance  STOP Mounjaro  Start sensitive-scale sliding scale insulin    Depression Continue Celexa , trazodone    Chronic anemia, normocytic Baseline hemoglobin 9-10 Currently at baseline   Hyperlipidemia Continue statin   History of glaucoma Continue outpatient eyedrops   Class 1 obesity Body mass index is 31.53 kg/m.SABRA  Weight loss should be encouraged Outpatient PCP/bariatric medicine f/u encouraged Significantly low or high BMI is associated with higher medical risk including morbidity and mortality         Consultants: GI Cardiology Nephrology Surgery PT   Procedures: None   Antibiotics: None  30 Day Unplanned Readmission Risk Score    Flowsheet Row ED to Hosp-Admission (  Current) from 04/27/2024 in [facility]  30 Day Unplanned Readmission Risk Score (%) 23.08 Filed at 05/01/2024 0801    This score is the patient's risk of an unplanned readmission within 30 days of being discharged (0 -100%). The score is based on  dignosis, age, lab data, medications, orders, and past utilization.   Low:  0-14.9   Medium: 15-21.9   High: 22-29.9   Extreme: 30 and above           Subjective: Ongoing abdominal discomfort, although she did have a large BM today.  Agitated with HR into 140s earlier today, tremulous.   Objective: Vitals:   05/01/24 1237 05/01/24 1640  BP: (!) 157/88 (!) 171/87  Pulse: (!) 121 (!) 126  Resp: 18 18  Temp: 98.7 F (37.1 C) 99.1 F (37.3 C)  SpO2: 100% 98%    Intake/Output Summary (Last 24 hours) at 05/01/2024 1732 Last data filed at 05/01/2024 1500 Gross per 24 hour  Intake 1452.68 ml  Output 700 ml  Net 752.68 ml   Filed Weights   04/28/24 0912  Weight: 80.7 kg    Exam:  General:  Appears calm but uncomfortable, diffusely tremulous Eyes:  normal lids, iris ENT:  grossly normal hearing, lips & tongue, mmm; NG tube in place Cardiovascular:  RRR. No LE edema.  Respiratory:   CTA bilaterally with no wheezes/rales/rhonchi.  Normal respiratory effort. Abdomen:  soft, improved diffuse abdominal TTP, improved distention Skin:  no rash or induration seen on limited exam Musculoskeletal:  grossly normal tone BUE/BLE, good ROM, no bony abnormality Psychiatric:  blunted mood and affect, speech fluent and appropriate, AOx3 Neurologic:  CN 2-12 grossly intact, moves all extremities in coordinated fashion  Data Reviewed: I have reviewed the patient's lab results since admission.  Pertinent labs for today include:   Na++ 156 K+ 3.0, replaced CO2 17, stable BUN 29/Creatinine 1.98/GFR 26, worsening Stable CBC     Family Communication: None present; I spoke with her son by telephone  Mobility: PT/OT Consulted and are recommending - Home Health Pt10/02/2024 1800    Code Status: Full Code    Disposition: Status is: Inpatient Remains inpatient appropriate because: ongoing management     Time spent: 50 minutes  Unresulted Labs (From admission, onward)      Start     Ordered   05/02/24 0500  Basic metabolic panel with GFR  Tomorrow morning,   R        05/01/24 0834   05/02/24 0500  CBC with Differential/Platelet  Tomorrow morning,   R        05/01/24 0834   05/01/24 1830  Basic metabolic panel  Once-Timed,   TIMED        05/01/24 1732             Author: Delon Herald, MD 05/01/2024 5:32 PM  For on call review [email or web address].

## 2024-05-01 NOTE — Plan of Care (Signed)

## 2024-05-01 NOTE — Progress Notes (Signed)
 Physical Therapy Treatment Patient Details Name: Brooke [name] MRN: [id number] DOB: 1950/11/01 Today's Date: 05/01/2024   History of Present Illness 73 yo who presented on 10/7 with abdominal pain. CT with ileus. PMH: DM, HTN, stage 3 CKD, and COPD with recent admission for n/v associated with Mounjaro  (continued use) and COVID    PT Comments   Pt admitted with above diagnosis.  Pt currently with functional limitations due to the deficits listed below (see PT Problem List). PT arrived and pt standing in room with a foot on either side of the IV pole and reaching for items from sink counter top, no AD for support, pt exhibited unsafe behaviors placing pt at high fall risk and ed provided. PT able to redirect pt to [facility], assist with lower body dressing task. PT noted pt shaking all over, pt indicating acute onset this am and she is unable to control. Pt stated nursing staff aware. Pt required CGA and HHA for safety from [name] to bed, S for supine to sit and min cues for repositioning in bed. PT obtained dynamap and assessing O2 100% on RA and pt noted to be tachycardic with PR elevated to 143. MD entered room at that time, aware of elevated PR and reporting abn labs including sodium, potassium and creatine levels. Pt expressed discomfort from NG tube. Pt left in bed, all needs in place and MD at bedside. Pt will benefit from acute skilled PT to increase their independence and safety with mobility to allow discharge.      If plan is discharge home, recommend the following: A little help with bathing/dressing/bathroom;Help with stairs or ramp for entrance;Assist for transportation;Assistance with cooking/housework   Can travel by private vehicle        Equipment Recommendations  None recommended by PT    Recommendations for Other Services       Precautions / Restrictions Precautions Precautions: Fall Restrictions Weight Bearing Restrictions Per Provider Order: No     Mobility  Bed  Mobility Overal bed mobility: Needs Assistance Bed Mobility: Sit to Supine       Sit to supine: Supervision   General bed mobility comments: S and min cues    Transfers Overall transfer level: Needs assistance Equipment used: 1 person hand held assist Transfers: Sit to/from Stand, Bed to chair/wheelchair/BSC Sit to Stand: Contact guard assist Stand pivot transfers: Contact guard assist         General transfer comment: cues for safety and proper UE placement with HHA    Ambulation/Gait               General Gait Details: NT, pt exhibited a change in medical status and pt assist ed from [facility] to bed   Stairs             Wheelchair Mobility     Tilt Bed    Modified Rankin (Stroke Patients Only)       Balance Overall balance assessment: Needs assistance Sitting-balance support: No upper extremity supported, Feet unsupported Sitting balance-Leahy Scale: Fair     Standing balance support: During functional activity, Single extremity supported Standing balance-Leahy Scale: Poor                              Communication Communication Communication: No apparent difficulties  Cognition Arousal: Alert Behavior During Therapy: WFL for tasks assessed/performed   PT - Cognitive impairments: No apparent impairments  Following commands: Intact      Cueing Cueing Techniques: Verbal cues  Exercises      General Comments        Pertinent Vitals/Pain Pain Assessment Pain Assessment: Faces Faces Pain Scale: Hurts a little bit Pain Location: nose/throat from Bladen tube Pain Descriptors / Indicators: Constant, Discomfort Pain Intervention(s): Monitored during session    Home Living                          Prior Function            PT Goals (current goals can now be found in the care plan section) Acute Rehab PT Goals PT Goal Formulation: With patient Time For Goal Achievement:  05/13/24 Potential to Achieve Goals: Good Progress towards PT goals: Not progressing toward goals - comment    Frequency    Min 2X/week      PT Plan      Co-evaluation              AM-PAC PT 6 Clicks Mobility   Outcome Measure  Help needed turning from your back to your side while in a flat bed without using bedrails?: None Help needed moving from lying on your back to sitting on the side of a flat bed without using bedrails?: None Help needed moving to and from a bed to a chair (including a wheelchair)?: A Little Help needed standing up from a chair using your arms (e.g., wheelchair or bedside chair)?: A Little Help needed to walk in hospital room?: A Little Help needed climbing 3-5 steps with a railing? : A Little 6 Click Score: 20    End of Session Equipment Utilized During Treatment: Gait belt Activity Tolerance: Patient limited by fatigue;Other (comment) (systemic shaking and discomfort from NG tube) Patient left: with call bell/phone within reach;in bed;with bed alarm set Nurse Communication: Mobility status PT Visit Diagnosis: Other abnormalities of gait and mobility (R26.89)     Time: 8592-8580 PT Time Calculation (min) (ACUTE ONLY): 12 min  Charges:    $Therapeutic Activity: 8-22 mins PT General Charges $$ ACUTE PT VISIT: 1 Visit                     Brooke [name], PT Acute Rehab    Brooke [name] VEAR Drone 05/01/2024, 5:43 PM

## 2024-05-01 NOTE — Consult Note (Signed)
 Reason for Consult: Abdominal pain Referring Physician: Dr. Barbarann Dorian [name] Brooke [name] is an 73 y.o. female.  HPI: The patient is a 73 year old black female who has been in the hospital with a bowel obstruction for about 4 days.  Prior to that she was at home for 2 weeks with symptoms of nausea and vomiting and abdominal pain.  She had an NG tube placed.  Her x-rays continue to show evidence of a bowel obstruction although she reports having had a bowel movement today and flatus today.  A CT scan when she was initially admitted was suggestive of small bowel obstruction versus ileus since there was no obvious transition point in the small bowel.  Since being here her sodium has continued to rise and her potassium is still not replaced.  Her creatinine is also rising now.  Past Medical History:  Diagnosis Date   Alopecia    Anxiety    Aortic atherosclerosis    Asthma    FOLLOWED BY PCP   Eczema    GERD (gastroesophageal reflux disease)    Gout    04-28-2018--- per pt stable , as been a while since last episode   Heart murmur    History of colon polyps    History of syncope 2015   Hyperlipidemia    Hypertension    Hypothyroidism    OA (osteoarthritis) of knee    bilateral   PVC's (premature ventricular contractions)    Toxic multinodular goiter    03/ 2003  s/p RAI   Type 2 diabetes mellitus (HCC)    followed by pcp   Ventricular tachycardia, polymorphic (HCC) 01/04/2014   primary cardiologist-- dr wilbert turner (hx monitor 2015 showed couplet PVCs, as trigger)   Wears glasses    Wears partial dentures    upper    Past Surgical History:  Procedure Laterality Date   ABDOMINAL HYSTERECTOMY  10/22/1999   WITH BSO   CATARACT EXTRACTION W/ INTRAOCULAR LENS IMPLANT Left YRS AGO   COLONOSCOPY     EXCISION ABDOMINAL WALL MASS  12-20-2005   dr merrilyn @[facility]    neruofibroma   LAPAROSCOPIC CHOLECYSTECTOMY  10/01/2002   dr merrilyn @[facility]    LEFT HEART CATHETERIZATION WITH CORONARY ANGIOGRAM N/A  01/07/2014   Procedure: LEFT HEART CATHETERIZATION WITH CORONARY ANGIOGRAM;  Surgeon: Peter M Swaziland, MD;  Location: [facility] CATH LAB;  Service: Cardiovascular;  Laterality: N/A;   RASTELLI PROCEDURE  6/98 neg   RENAL ARTERY STENT Left 11/2003   angioplasty and stenting   RENAL ARTERY STENT Left 02/2005   re-stenting   TOTAL KNEE ARTHROPLASTY Right 05/05/2018   Procedure: RIGHT TOTAL KNEE ARTHROPLASTY;  Surgeon: Liam Lerner, MD;  Location: [facility];  Service: Orthopedics;  Laterality: Right;   TOTAL KNEE ARTHROPLASTY Left 09/30/2018   Procedure: TOTAL KNEE ARTHROPLASTY;  Surgeon: Sheril Coy, MD;  Location: [facility];  Service: Orthopedics;  Laterality: Left;    Family History  Problem Relation Age of Onset   Diabetes Mother    Heart disease Father    Colon cancer Neg Hx    Esophageal cancer Neg Hx    Rectal cancer Neg Hx    Stomach cancer Neg Hx    BRCA 1/2 Neg Hx    Breast cancer Neg Hx     Social History:  reports that she has never smoked. She has never been exposed to tobacco smoke. She has never used smokeless tobacco. She reports that she does not drink alcohol and does not use drugs.  Allergies:  Allergies  Allergen Reactions   Metformin  And Related Nausea And Vomiting   Ace Inhibitors Swelling and Other (See Comments)    Ankles swell   Codeine Nausea And Vomiting and Rash   Hydrocodone  Nausea And Vomiting   Tramadol  Nausea And Vomiting   Tylenol  [Acetaminophen ] Itching, Rash and Other (See Comments)    Allergic to prescription strength Tylenol . The patient is taking 500 mg Tylenol  in 2025, however.    Medications: I have reviewed the patient's current medications.  Results for orders placed or performed during the hospital encounter of 04/27/24 (from the past 48 hours)  Glucose, capillary     Status: Abnormal   Collection Time: 04/29/24  5:04 PM  Result Value Ref Range   Glucose-Capillary 114 (H) 70 - 99 mg/dL    Comment: Glucose reference range applies only to  samples taken after fasting for at least 8 hours.  Glucose, capillary     Status: Abnormal   Collection Time: 04/29/24  8:16 PM  Result Value Ref Range   Glucose-Capillary 114 (H) 70 - 99 mg/dL    Comment: Glucose reference range applies only to samples taken after fasting for at least 8 hours.   Comment 1 Notify RN    Comment 2 Document in Chart   Glucose, capillary     Status: Abnormal   Collection Time: 04/29/24 11:46 PM  Result Value Ref Range   Glucose-Capillary 111 (H) 70 - 99 mg/dL    Comment: Glucose reference range applies only to samples taken after fasting for at least 8 hours.   Comment 1 Notify RN    Comment 2 Document in Chart   Glucose, capillary     Status: Abnormal   Collection Time: 04/30/24 12:13 AM  Result Value Ref Range   Glucose-Capillary 118 (H) 70 - 99 mg/dL    Comment: Glucose reference range applies only to samples taken after fasting for at least 8 hours.  Glucose, capillary     Status: Abnormal   Collection Time: 04/30/24  4:12 AM  Result Value Ref Range   Glucose-Capillary 102 (H) 70 - 99 mg/dL    Comment: Glucose reference range applies only to samples taken after fasting for at least 8 hours.   Comment 1 Notify RN    Comment 2 Document in Chart   CBC with Differential/Platelet     Status: Abnormal   Collection Time: 04/30/24  4:31 AM  Result Value Ref Range   WBC 4.8 4.0 - 10.5 K/uL   RBC 3.95 3.87 - 5.11 MIL/uL   Hemoglobin 10.7 (L) 12.0 - 15.0 g/dL   HCT 65.9 (L) 63.9 - 53.9 %   MCV 86.1 80.0 - 100.0 fL   MCH 27.1 26.0 - 34.0 pg   MCHC 31.5 30.0 - 36.0 g/dL   RDW 80.1 (H) 88.4 - 84.4 %   Platelets 258 150 - 400 K/uL   nRBC 0.0 0.0 - 0.2 %   Neutrophils Relative % 57 %   Neutro Abs 3.5 1.7 - 7.7 K/uL   Band Neutrophils 15 %   Lymphocytes Relative 25 %   Lymphs Abs 1.2 0.7 - 4.0 K/uL   Monocytes Relative 3 %   Monocytes Absolute 0.1 0.1 - 1.0 K/uL   Eosinophils Relative 0 %   Eosinophils Absolute 0.0 0.0 - 0.5 K/uL   Basophils Relative  0 %   Basophils Absolute 0.0 0.0 - 0.1 K/uL   WBC Morphology See Note     Comment: INCREASED  BANDS (>20% BANDS) Performed at [facility], [address], [place], [place] [address]   Basic metabolic panel with GFR     Status: Abnormal   Collection Time: 04/30/24  4:31 AM  Result Value Ref Range   Sodium 152 (H) 135 - 145 mmol/L   Potassium 3.0 (L) 3.5 - 5.1 mmol/L   Chloride 121 (H) 98 - 111 mmol/L   CO2 15 (L) 22 - 32 mmol/L   Glucose, Bld 104 (H) 70 - 99 mg/dL    Comment: Glucose reference range applies only to samples taken after fasting for at least 8 hours.   BUN 26 (H) 8 - 23 mg/dL   Creatinine, Ser 8.34 (H) 0.44 - 1.00 mg/dL   Calcium  8.9 8.9 - 10.3 mg/dL   GFR, Estimated 33 (L) >60 mL/min    Comment: (NOTE) Calculated using the CKD-EPI Creatinine Equation (2021)    Anion gap 16 (H) 5 - 15    Comment: Performed at [facility], [address], [place], [place] [address]  Glucose, capillary     Status: Abnormal   Collection Time: 04/30/24  7:39 AM  Result Value Ref Range   Glucose-Capillary 104 (H) 70 - 99 mg/dL    Comment: Glucose reference range applies only to samples taken after fasting for at least 8 hours.   Comment 1 Notify RN   Glucose, capillary     Status: Abnormal   Collection Time: 04/30/24 11:36 AM  Result Value Ref Range   Glucose-Capillary 108 (H) 70 - 99 mg/dL    Comment: Glucose reference range applies only to samples taken after fasting for at least 8 hours.   Comment 1 Notify RN   Basic metabolic panel     Status: Abnormal   Collection Time: 04/30/24 12:00 PM  Result Value Ref Range   Sodium 155 (H) 135 - 145 mmol/L   Potassium 3.1 (L) 3.5 - 5.1 mmol/L   Chloride 124 (H) 98 - 111 mmol/L   CO2 17 (L) 22 - 32 mmol/L   Glucose, Bld 106 (H) 70 - 99 mg/dL    Comment: Glucose reference range applies only to samples taken after fasting for at least 8 hours.   BUN 27 (H) 8 - 23 mg/dL   Creatinine, Ser 8.23 (H) 0.44  - 1.00 mg/dL   Calcium  9.0 8.9 - 10.3 mg/dL   GFR, Estimated 30 (L) >60 mL/min    Comment: (NOTE) Calculated using the CKD-EPI Creatinine Equation (2021)    Anion gap 15 5 - 15    Comment: Performed at [facility], [address], [place], [place] [address]  Glucose, capillary     Status: Abnormal   Collection Time: 04/30/24  4:47 PM  Result Value Ref Range   Glucose-Capillary 113 (H) 70 - 99 mg/dL    Comment: Glucose reference range applies only to samples taken after fasting for at least 8 hours.   Comment 1 Notify RN   Glucose, capillary     Status: Abnormal   Collection Time: 04/30/24  9:38 PM  Result Value Ref Range   Glucose-Capillary 114 (H) 70 - 99 mg/dL    Comment: Glucose reference range applies only to samples taken after fasting for at least 8 hours.   Comment 1 Notify RN    Comment 2 Document in Chart   Basic metabolic panel with GFR     Status: Abnormal   Collection Time: 05/01/24  4:16 AM  Result Value  Ref Range   Sodium 156 (H) 135 - 145 mmol/L   Potassium 3.0 (L) 3.5 - 5.1 mmol/L   Chloride 126 (H) 98 - 111 mmol/L   CO2 17 (L) 22 - 32 mmol/L   Glucose, Bld 106 (H) 70 - 99 mg/dL    Comment: Glucose reference range applies only to samples taken after fasting for at least 8 hours.   BUN 29 (H) 8 - 23 mg/dL   Creatinine, Ser 8.01 (H) 0.44 - 1.00 mg/dL   Calcium  9.0 8.9 - 10.3 mg/dL   GFR, Estimated 26 (L) >60 mL/min    Comment: (NOTE) Calculated using the CKD-EPI Creatinine Equation (2021)    Anion gap 13 5 - 15    Comment: Performed at [facility], [address], [place], [place] [address]  CBC with Differential/Platelet     Status: Abnormal   Collection Time: 05/01/24  4:16 AM  Result Value Ref Range   WBC 6.4 4.0 - 10.5 K/uL   RBC 3.96 3.87 - 5.11 MIL/uL   Hemoglobin 10.5 (L) 12.0 - 15.0 g/dL   HCT 65.9 (L) 63.9 - 53.9 %   MCV 85.9 80.0 - 100.0 fL   MCH 26.5 26.0 - 34.0 pg   MCHC 30.9 30.0 - 36.0 g/dL   RDW 80.1  (H) 88.4 - 15.5 %   Platelets 236 150 - 400 K/uL   nRBC 0.0 0.0 - 0.2 %   Neutrophils Relative % 61 %   Neutro Abs 3.9 1.7 - 7.7 K/uL   Lymphocytes Relative 15 %   Lymphs Abs 1.0 0.7 - 4.0 K/uL   Monocytes Relative 22 %   Monocytes Absolute 1.4 (H) 0.1 - 1.0 K/uL   Eosinophils Relative 0 %   Eosinophils Absolute 0.0 0.0 - 0.5 K/uL   Basophils Relative 1 %   Basophils Absolute 0.0 0.0 - 0.1 K/uL   WBC Morphology See Note     Comment: INCREASED BANDS (>20% BANDS)   Smear Review Normal platelet morphology    Immature Granulocytes 1 %   Abs Immature Granulocytes 0.08 (H) 0.00 - 0.07 K/uL   Burr Cells PRESENT     Comment: Performed at [facility], [address] [place], [place] [address]  Glucose, capillary     Status: Abnormal   Collection Time: 05/01/24  7:34 AM  Result Value Ref Range   Glucose-Capillary 106 (H) 70 - 99 mg/dL    Comment: Glucose reference range applies only to samples taken after fasting for at least 8 hours.   Comment 1 Notify RN   Glucose, capillary     Status: Abnormal   Collection Time: 05/01/24 11:56 AM  Result Value Ref Range   Glucose-Capillary 121 (H) 70 - 99 mg/dL    Comment: Glucose reference range applies only to samples taken after fasting for at least 8 hours.  Basic metabolic panel with GFR     Status: Abnormal   Collection Time: 05/01/24  1:41 PM  Result Value Ref Range   Sodium 158 (H) 135 - 145 mmol/L   Potassium 3.2 (L) 3.5 - 5.1 mmol/L   Chloride 127 (H) 98 - 111 mmol/L   CO2 18 (L) 22 - 32 mmol/L   Glucose, Bld 119 (H) 70 - 99 mg/dL    Comment: Glucose reference range applies only to samples taken after fasting for at least 8 hours.   BUN 28 (H) 8 - 23 mg/dL   Creatinine, Ser 7.88 (H) 0.44 -  1.00 mg/dL   Calcium  9.3 8.9 - 10.3 mg/dL   GFR, Estimated 24 (L) >60 mL/min    Comment: (NOTE) Calculated using the CKD-EPI Creatinine Equation (2021)    Anion gap 13 5 - 15    Comment: Performed at [facility], [address] [place], [place] [address]  Magnesium      Status: None   Collection Time: 05/01/24  1:41 PM  Result Value Ref Range   Magnesium  2.2 1.7 - 2.4 mg/dL    Comment: Performed at [facility], [address], [place], [place] [address]    VAS US  RENAL ARTERY DUPLEX Result Date: 05/01/2024 ABDOMINAL VISCERAL Patient Name:  GRACEMARIE SKEET [facility]  Date of Exam:   05/01/2024 Medical Rec #: [id number]       Accession #:    [id number] Date of Birth: 02-Aug-1950       Patient Gender: F Patient Age:   33 years Exam Location:  [facility] Procedure:      VAS US  RENAL ARTERY DUPLEX Referring Phys: [id number] JENNIFER YATES -------------------------------------------------------------------------------- Indications: Acute kidney injury [302455] High Risk Factors: Hypertension, hyperlipidemia, Diabetes. Limitations: Air/bowel gas, obesity, patient discomfort and patient movement, patient respiratory disturbance. Comparison Study: No prior studies. Performing Technologist: Cordella Collet RVT  Examination Guidelines: A complete evaluation includes B-mode imaging, spectral Doppler, color Doppler, and power Doppler as needed of all accessible portions of each vessel. Bilateral testing is considered an integral part of a complete examination. Limited examinations for reoccurring indications may be performed as noted.  Duplex Findings: +--------------------+--------+--------+------+--------------+ Mesenteric          PSV cm/sEDV cm/sPlaque   Comments    +--------------------+--------+--------+------+--------------+ Aorta Mid             101      19                        +--------------------+--------+--------+------+--------------+ Celiac Artery Origin                      Not visualized +--------------------+--------+--------+------+--------------+ SMA Origin                                Not visualized  +--------------------+--------+--------+------+--------------+    +------------------+--------+--------+-------+ Right Renal ArteryPSV cm/sEDV cm/sComment +------------------+--------+--------+-------+ Origin               52      22           +------------------+--------+--------+-------+ Proximal             85      32           +------------------+--------+--------+-------+ Mid                  82      25           +------------------+--------+--------+-------+ Distal               93      23           +------------------+--------+--------+-------+ +-----------------+--------+--------+-------+ Left Renal ArteryPSV cm/sEDV cm/sComment +-----------------+--------+--------+-------+ Origin              52      14           +-----------------+--------+--------+-------+ Proximal            59      18           +-----------------+--------+--------+-------+  Mid                103      26           +-----------------+--------+--------+-------+ Distal              64      17           +-----------------+--------+--------+-------+ +------------+--------+--------+----+-----------+--------+--------+----+ Right KidneyPSV cm/sEDV cm/sRI  Left KidneyPSV cm/sEDV cm/sRI   +------------+--------+--------+----+-----------+--------+--------+----+ Upper Pole  41      13      0.68Upper Pole 52      10      0.80 +------------+--------+--------+----+-----------+--------+--------+----+ Mid         33      9       0.        51      13      0.74 +------------+--------+--------+----+-----------+--------+--------+----+ Lower Pole  42      12      0.70Lower Pole 53      14      0.73 +------------+--------+--------+----+-----------+--------+--------+----+ Hilar       93      19      0.79Hilar      70      19      0.73 +------------+--------+--------+----+-----------+--------+--------+----+ +------------------+----+------------------+----+ Right  Kidney          Left Kidney            +------------------+----+------------------+----+ RAR                   RAR                    +------------------+----+------------------+----+ RAR (manual)      0.92RAR (manual)      1.02 +------------------+----+------------------+----+ Cortex                Cortex                 +------------------+----+------------------+----+ Cortex thickness      Corex thickness        +------------------+----+------------------+----+ Kidney length (cm)9.50Kidney length (cm)9.00 +------------------+----+------------------+----+   Summary: Renal:  Right: Normal size right kidney. Normal right Resisitive Index. No        evidence of right renal artery stenosis. Left:  Normal size of left kidney. Normal left Resistive Index. No        evidence of left renal artery stenosis.  *See table(s) above for measurements and observations.     Preliminary    DG Abd 1 View Result Date: 05/01/2024 EXAM: 1 VIEW XRAY OF THE ABDOMEN 05/01/2024 05:42:00 AM COMPARISON: 04/30/2024 CLINICAL HISTORY: Ileus (HCC) 01250. ileus. FINDINGS: LINES, TUBES AND DEVICES: Enteric tube terminates in the stomach. Left renal artery stent noted. BOWEL: Persistent and progressive dilated small bowel loops in the left mid and right lower quadrant abdomen. SOFT TISSUES: Surgical clips in the right upper quadrant of the abdomen. No opaque urinary calculi. BONES: No acute osseous abnormality. IMPRESSION: 1. Persistent and progressive small bowel dilatation compatible with either mid to distal small bowel obstruction versus ileus. 2. Enteric tube terminates in the stomach. 3. Postsurgical changes with right upper quadrant surgical clips and left renal artery stent. Electronically signed by: Waddell Calk MD 05/01/2024 08:34 AM EDT RP Workstation: [id number]   DG Abd 1 View Result Date: 04/30/2024 EXAM: 1 VIEW XRAY OF THE ABDOMEN 04/30/2024 05:20:00 AM COMPARISON: 04/29/2024 CLINICAL HISTORY:  Ileus (HCC) 01250. ileus FINDINGS: LINES, TUBES AND DEVICES: Enteric tube  is in place with tip in side port below the GE junction. The tube has been slightly retracted with the side hole approximately 2 cm below the GE junction. Left renal artery stent noted. BOWEL: Single dilated loop of small bowel within the left upper quadrant, up to 3.8 cm in diameter. SOFT TISSUES: Right upper quadrant surgical clips noted. No opaque urinary calculi. BONES: No acute osseous abnormality. IMPRESSION: 1. Single dilated loop of small bowel in the left abdomen measuring up to 3.8 cm, suggestive of focal ileus. 2. Slight retraction of the enteric tube. The side hole for the tube is 2 cm below the ge junction. Electronically signed by: Waddell Calk MD 04/30/2024 07:46 AM EDT RP Workstation: [id number]    Review of Systems  Constitutional: Negative.   HENT: Negative.    Eyes: Negative.   Respiratory: Negative.    Cardiovascular: Negative.   Gastrointestinal:  Positive for abdominal pain, nausea and vomiting.  Endocrine: Negative.   Genitourinary: Negative.   Musculoskeletal: Negative.   Skin: Negative.   Allergic/Immunologic: Negative.   Neurological: Negative.   Hematological: Negative.   Psychiatric/Behavioral: Negative.     Blood pressure (!) 157/88, pulse (!) 121, temperature 98.7 F (37.1 C), resp. rate 18, height 5' 3 (1.6 m), weight 80.7 kg, SpO2 100%. Physical Exam Vitals reviewed.  Constitutional:      General: She is not in acute distress.    Appearance: Normal appearance. She is obese.  HENT:     Head: Normocephalic and atraumatic.     Right Ear: External ear normal.     Left Ear: External ear normal.     Nose: Nose normal.     Mouth/Throat:     Mouth: Mucous membranes are dry.     Pharynx: Oropharynx is clear.  Eyes:     General: No scleral icterus.    Extraocular Movements: Extraocular movements intact.     Conjunctiva/sclera: Conjunctivae normal.     Pupils: Pupils are equal,  round, and reactive to light.  Cardiovascular:     Rate and Rhythm: Normal rate and regular rhythm.     Pulses: Normal pulses.     Heart sounds: Normal heart sounds.  Pulmonary:     Effort: Pulmonary effort is normal. No respiratory distress.     Breath sounds: Normal breath sounds.  Abdominal:     General: Abdomen is flat.     Palpations: Abdomen is soft.     Tenderness: There is no guarding.     Comments: There is mild to moderate epigastric tenderness.  Musculoskeletal:        General: No swelling or deformity. Normal range of motion.     Cervical back: Normal range of motion and neck supple.  Skin:    General: Skin is warm and dry.     Coloration: Skin is not jaundiced.  Neurological:     General: No focal deficit present.     Mental Status: She is alert and oriented to person, place, and time.  Psychiatric:        Mood and Affect: Mood normal.        Behavior: Behavior normal.     Assessment/Plan: The patient is admitted for bowel obstruction.  Clinically she has had some progress although her films still show some distended small bowel.  At this point we need to confirm the appropriate placement of her NG tube.  Once that is complete then we should start her on the small bowel protocol.  She will need  more aggressive resuscitation with correction of her sodium and potassium.  We will follow her closely with you.  Deward Null III 05/01/2024, 4:08 PM

## 2024-05-01 NOTE — Progress Notes (Signed)
 Initial Nutrition Assessment  DOCUMENTATION CODES:   Obesity unspecified  INTERVENTION:  - NPO due to ileus. - If diet advanced, recommend Boost Breeze po BID, each supplement provides 250 kcal and 9 grams of protein  - If unable to advance diet within the next 1-2 days, recommend TPN.   NUTRITION DIAGNOSIS:   Inadequate oral intake related to inability to eat, altered GI function (ileus) as evidenced by NPO status.  GOAL:   Patient will meet greater than or equal to 90% of their needs  MONITOR:   Diet advancement, Weight trends  REASON FOR ASSESSMENT:   NPO/Clear Liquid Diet (NPO x3 days)    ASSESSMENT:   73yo with h/o DM, HTN, stage 3 CKD, and COPD with recent admission for n/v associated with Mounjaro  (continued use) and COVID who presented on 10/7 with abdominal pain. Found to have ileus.   10/6 Admit; NPO 10/8 NGT placed  Patient reports a UBW of 173# and that she has been on Monjaro and trying to lose weight.  Per chart review, no significant changes in weight over the past year.   Patient reports she has not been eating well for almost 1 month. Appetite has been poor since being on Monjaro and she notes that the 2 weeks leading up to admission she was vomiting everything she ate back up.   Patient has been NPO since admission, now more than 3 days. NGT remains to LIS. Did have a BM this morning.  Recommend TPN if unable to advance diet within the next 1-2 days.    Medications reviewed and include: D5 @ 156mL/hr (provides 510 kcals over 24 hours)  Labs reviewed:  Na 156 K+ 3.0 Creatinine 1.98 HA1C 6.2 (as of 02/26/24) Blood Glucose 104-114 x24 hours  NUTRITION - FOCUSED PHYSICAL EXAM:  Flowsheet Row Most Recent Value  Orbital Region No depletion  Upper Arm Region No depletion  Thoracic and Lumbar Region No depletion  Buccal Region No depletion  Temple Region No depletion  Clavicle Bone Region No depletion  Clavicle and Acromion Bone Region No  depletion  Scapular Bone Region No depletion  Dorsal Hand No depletion  Patellar Region No depletion  Anterior Thigh Region No depletion  Posterior Calf Region No depletion  Edema (RD Assessment) Mild  Hair Unable to assess  Eyes Reviewed  Mouth Reviewed  Skin Reviewed  Nails Reviewed    Diet Order:   Diet Order             Diet NPO time specified  Diet effective now                   EDUCATION NEEDS:  Education needs have been addressed  Skin:  Skin Assessment: Reviewed RN Assessment  Last BM:  10/10 - type 7  Height:  Ht Readings from Last 1 Encounters:  04/28/24 5' 3 (1.6 m)   Weight:  Wt Readings from Last 1 Encounters:  04/28/24 80.7 kg   Ideal Body Weight:  52.27 kg  BMI:  Body mass index is 31.53 kg/m.  Estimated Nutritional Needs:  Kcal:  1700-2000 kcals Protein:  80-95 grams Fluid:  >/= 1.7L    Trude Ned RD, LDN Contact via Secure Chat.

## 2024-05-01 NOTE — Progress Notes (Addendum)
 [facility] Progress Note  CC:   Ileus   Subjective:  X-ray looks worse but patient says that she had a good bowel movement this morning.  Nursing actually has two documented since midnight.  Says that she is passing flatus as well.  Having a lot of pain/sore throat from the NGT.  Sitting up on the side of the bed.  Objective:  Vital signs in last 24 hours: Temp:  [98.6 F (37 C)-99.5 F (37.5 C)] 98.8 F (37.1 C) (10/10 0924) Pulse Rate:  [110-119] 114 (10/10 0924) Resp:  [14-18] 16 (10/10 0924) BP: (134-179)/(70-88) 179/86 (10/10 0957) SpO2:  [96 %-100 %] 97 % (10/10 0924) Last BM Date : 05/01/24 General:  Alert, Well-developed, in NAD, but appears uncomfortable. Heart:  Tachy. Pulm:  CTAB.  No W/R/R. Abdomen:  Soft, maybe mildly distended.  BS still very quiet and sparse.  Mild diffuse TTP. Extremities:  Without edema. Neurologic:  Alert and oriented x 4;  grossly normal neurologically. Psych:  Alert and cooperative. Normal mood and affect.  Intake/Output from previous day: 10/09 0701 - 10/10 0700 In: 2599.3 [I.V.:1299.7; IV Piggyback:1299.7] Out: 700 [Emesis/NG output:700]  Lab Results: Recent Labs    04/29/24 1047 04/30/24 0431 05/01/24 0416  WBC 6.3 4.8 6.4  HGB 10.3* 10.7* 10.5*  HCT 33.2* 34.0* 34.0*  PLT 258 258 236   BMET Recent Labs    04/30/24 0431 04/30/24 1200 05/01/24 0416  NA 152* 155* 156*  K 3.0* 3.1* 3.0*  CL 121* 124* 126*  CO2 15* 17* 17*  GLUCOSE 104* 106* 106*  BUN 26* 27* 29*  CREATININE 1.65* 1.76* 1.98*  CALCIUM  8.9 9.0 9.0   DG Abd 1 View Result Date: 05/01/2024 EXAM: 1 VIEW XRAY OF THE ABDOMEN 05/01/2024 05:42:00 AM COMPARISON: 04/30/2024 CLINICAL HISTORY: Ileus (HCC) [address]1250. ileus. FINDINGS: LINES, TUBES AND DEVICES: Enteric tube terminates in the stomach. Left renal artery stent noted. BOWEL: Persistent and progressive dilated small bowel loops in the left mid and right lower quadrant abdomen. SOFT TISSUES:  Surgical clips in the right upper quadrant of the abdomen. No opaque urinary calculi. BONES: No acute osseous abnormality. IMPRESSION: 1. Persistent and progressive small bowel dilatation compatible with either mid to distal small bowel obstruction versus ileus. 2. Enteric tube terminates in the stomach. 3. Postsurgical changes with right upper quadrant surgical clips and left renal artery stent. Electronically signed by: Waddell Calk MD 05/01/2024 08:34 AM EDT RP Workstation: [id number]   DG Abd 1 View Result Date: 04/30/2024 EXAM: 1 VIEW XRAY OF THE ABDOMEN 04/30/2024 05:20:00 AM COMPARISON: 04/29/2024 CLINICAL HISTORY: Ileus (HCC) 01250. ileus FINDINGS: LINES, TUBES AND DEVICES: Enteric tube is in place with tip in side port below the GE junction. The tube has been slightly retracted with the side hole approximately 2 cm below the GE junction. Left renal artery stent noted. BOWEL: Single dilated loop of small bowel within the left upper quadrant, up to 3.8 cm in diameter. SOFT TISSUES: Right upper quadrant surgical clips noted. No opaque urinary calculi. BONES: No acute osseous abnormality. IMPRESSION: 1. Single dilated loop of small bowel in the left abdomen measuring up to 3.8 cm, suggestive of focal ileus. 2. Slight retraction of the enteric tube. The side hole for the tube is 2 cm below the ge junction. Electronically signed by: Waddell Calk MD 04/30/2024 07:46 AM EDT RP Workstation: [id number]   DG Abd 1 View Result Date: 04/29/2024 CLINICAL DATA:  NG tube placement. EXAM: ABDOMEN - 1  VIEW COMPARISON:  Same day abdominal radiograph dated 04/29/2024 at 9:44 a.m. FINDINGS: Enteric tube tip within the stomach. Visualized lung bases are clear. IMPRESSION: Enteric tube tip within the stomach. Electronically Signed   By: Harrietta Sherry M.D.   On: 04/29/2024 17:04   ECHOCARDIOGRAM COMPLETE Result Date: 04/29/2024    ECHOCARDIOGRAM REPORT   Patient Name:   DILYN OSORIA [facility] Date of Exam: 04/29/2024 Medical  Rec #:  [id number]      Height:       63.0 in Accession #:    [id number]     Weight:       178.0 lb Date of Birth:  1950-09-09      BSA:          1.840 m Patient Age:    73 years       BP:           177/81 mmHg Patient Gender: F              HR:           119 bpm. Exam Location:  Inpatient Procedure: 2D Echo, Cardiac Doppler and Color Doppler (Both Spectral and Color            Flow Doppler were utilized during procedure). Indications:    Chest Pain R07.9  History:        Patient has prior history of Echocardiogram examinations, most                 recent 11/10/2021. COPD, Signs/Symptoms:Chest Pain; Risk                 Factors:Diabetes and Hypertension. H/O Chronic kidney disease.  Sonographer:    Bernarda Rocks Referring Phys: [phone] STEVEN J NEWTON IMPRESSIONS  1. Left ventricular ejection fraction, by estimation, is 70 to 75%. The left ventricle has hyperdynamic function. The left ventricle has no regional wall motion abnormalities. There is mild left ventricular hypertrophy. Left ventricular diastolic parameters are consistent with Grade I diastolic dysfunction (impaired relaxation).  2. Findings concerning for LVOT obstruction. Details below.  3. Right ventricular systolic function is normal. The right ventricular size is normal. Mildly increased right ventricular wall thickness. There is moderately elevated pulmonary artery systolic pressure.  4. The mitral valve is normal in structure. No evidence of mitral valve regurgitation. No evidence of mitral stenosis.  5. The aortic valve is normal in structure. Aortic valve regurgitation is not visualized. No aortic stenosis is present.  6. The inferior vena cava is normal in size with greater than 50% respiratory variability, suggesting right atrial pressure of 3 mmHg. Comparison(s): A prior study was performed on 11/10/2021. LV outflow tract gradient now elevated. FINDINGS  Left Ventricle: There is a mid cavitary gradient of 41 mmHg at rest and was 39 mmHg with  valsalva but in a different location and may be underestimated. This is may be concerning for LVOT obstruction. Left ventricular ejection fraction, by estimation, is 70 to 75%. The left ventricle has hyperdynamic function. The left ventricle has no regional wall motion abnormalities. The left ventricular internal cavity size was small. There is mild left ventricular hypertrophy. Left ventricular diastolic parameters are consistent with Grade I diastolic dysfunction (impaired relaxation). Right Ventricle: The right ventricular size is normal. Mildly increased right ventricular wall thickness. Right ventricular systolic function is normal. There is moderately elevated pulmonary artery systolic pressure. The tricuspid regurgitant velocity is 3.39 m/s, and with an assumed right atrial pressure of 3 mmHg, the estimated  right ventricular systolic pressure is 49.0 mmHg. Left Atrium: Left atrial size was normal in size. Right Atrium: Right atrial size was normal in size. Pericardium: Trivial pericardial effusion is present. The pericardial effusion is anterior to the right ventricle. Mitral Valve: The mitral valve is normal in structure. No evidence of mitral valve regurgitation. No evidence of mitral valve stenosis. MV peak gradient, 9.1 mmHg. The mean mitral valve gradient is 4.0 mmHg. Tricuspid Valve: The tricuspid valve is normal in structure. Tricuspid valve regurgitation is trivial. No evidence of tricuspid stenosis. Aortic Valve: The aortic valve is normal in structure. Aortic valve regurgitation is not visualized. No aortic stenosis is present. Aortic valve mean gradient measures 13.0 mmHg. Aortic valve peak gradient measures 27.2 mmHg. Aortic valve area, by VTI measures 1.60 cm. Pulmonic Valve: The pulmonic valve was normal in structure. Pulmonic valve regurgitation is not visualized. No evidence of pulmonic stenosis. Aorta: The aortic root is normal in size and structure. Venous: The inferior vena cava is normal  in size with greater than 50% respiratory variability, suggesting right atrial pressure of 3 mmHg. IAS/Shunts: No atrial level shunt detected by color flow Doppler.  LEFT VENTRICLE PLAX 2D LVIDd:         2.70 cm     Diastology LVIDs:         1.70 cm     LV e' medial:    6.68 cm/s LV PW:         1.20 cm     LV E/e' medial:  15.4 LV IVS:        1.20 cm     LV e' lateral:   8.55 cm/s LVOT diam:     1.50 cm     LV E/e' lateral: 12.0 LV SV:         59 LV SV Index:   32 LVOT Area:     1.77 cm  LV Volumes (MOD) LV vol d, MOD A2C: 80.9 ml LV vol d, MOD A4C: 79.1 ml LV vol s, MOD A2C: 22.3 ml LV vol s, MOD A4C: 32.1 ml LV SV MOD A2C:     58.6 ml LV SV MOD A4C:     79.1 ml LV SV MOD BP:      55.5 ml RIGHT VENTRICLE             IVC RV Basal diam:  2.80 cm     IVC diam: 1.40 cm RV S prime:     27.10 cm/s TAPSE (M-mode): 2.1 cm RVSP:           49.0 mmHg LEFT ATRIUM             Index        RIGHT ATRIUM           Index LA diam:        3.20 cm 1.74 cm/m   RA Pressure: 3.00 mmHg LA Vol (A2C):   31.9 ml 17.34 ml/m  RA Area:     8.39 cm LA Vol (A4C):   49.4 ml 26.84 ml/m  RA Volume:   14.80 ml  8.04 ml/m LA Biplane Vol: 41.2 ml 22.39 ml/m  AORTIC VALVE                     PULMONIC VALVE AV Area (Vmax):    1.58 cm      PV Vmax:          1.85 m/s AV Area (Vmean):   1.51 cm  PV Peak grad:     13.7 mmHg AV Area (VTI):     1.60 cm      PR End Diast Vel: 3.25 msec AV Vmax:           261.00 cm/s AV Vmean:          168.000 cm/s AV VTI:            0.370 m AV Peak Grad:      27.2 mmHg AV Mean Grad:      13.0 mmHg LVOT Vmax:         234.00 cm/s LVOT Vmean:        144.000 cm/s LVOT VTI:          0.335 m LVOT/AV VTI ratio: 0.91  AORTA Ao Root diam: 2.40 cm Ao Asc diam:  2.80 cm MITRAL VALVE                TRICUSPID VALVE MV Area (PHT): 8.25 cm     TR Peak grad:   46.0 mmHg MV Area VTI:   2.84 cm     TR Vmax:        339.00 cm/s MV Peak grad:  9.1 mmHg     Estimated RAP:  3.00 mmHg MV Mean grad:  4.0 mmHg     RVSP:           49.0  mmHg MV Vmax:       1.51 m/s MV Vmean:      95.8 cm/s    SHUNTS MV Decel Time: 92 msec      Systemic VTI:  0.34 m MV E velocity: 103.00 cm/s  Systemic Diam: 1.50 cm MV A velocity: 158.00 cm/s MV E/A ratio:  0.65 Emeline Calender Electronically signed by Emeline Calender Signature Date/Time: 04/29/2024/3:45:42 PM    Final    Assessment / Plan: 73 year old female with complaints of abdominal pain, nausea, and vomiting for the past 2 weeks.  Was admitted for a couple of days and treated conservatively 2 weeks ago for the same complaints.  CT scan showed suspected mild infectious or inflammatory colitis in the descending colon, but she was not having diarrhea.  Abdominal pain recurred and she has been vomiting significantly so returned to the hospital.  CT scan now shows diffuse small bowel ileus.  She is on Mounjaro  and that dose was recently increased about 3 weeks ago.  She only skipped 1 dose, just took her dose again 10/6. *Hypokalemia: Likely from GI losses.  Potassium is 2.8, which had been corrected but now is 3.0 again today *Leukocytosis at 15.8 K: Likely reactive.  Improved/resolved at 4.8K today. *Hypernatremia:  Na+ 156 today, increasing *COPD, stable *Chronic kidney disease stage III:  Cr increasing. *Type 2 diabetes mellitus   - Keep potassium greater than 4 if possible to optimize bowel/gut function.  Trend labs. - Likely should discontinue Mounjaro  use as that can cause ileus. - IV fluids, antiemetics.  Pain medications to be used conservatively as those can slow gut function.  Will allow sips of soda for comfort. - Encouraged her to keep working with PT, moving in bed, getting up to the chair, etc, to help stimulate her gut. - Abdominal x-ray again in AM. - Should consider surgery input with progression/failure to improve on x-ray today, but clinically she seems improved with two BMs and passing flatus. - Will order chloraseptic spray for her throat pain. - D/W Dr. Barbarann, will try to clamp NGT  and see  how she does.    LOS: 2 days   Brooke [name]. Zehr  05/01/2024, 12:03 PM  GI ATTENDING  Interval history and data reviewed.  Agree with the progress note as outlined above.  Clinical improvement in terms of bowel movements and abdominal exam.  Still with abnormal x-ray.  Will try to clamp NG tube to see how she tolerates this.  Repeat x-ray tomorrow.  Primary service to correct hypernatremia and hypokalemia.  If any worsening in either her clinical status or x-ray, would obtain a surgical opinion.  Will follow.  Norleen SAILOR. Abran Raddle., M.D. [facility] Division of Gastroenterology

## 2024-05-01 NOTE — Progress Notes (Signed)
 Renal artery duplex has been completed. Preliminary results can be found in CV Proc through chart review.   05/01/24 10:59 AM Cathlyn Collet RVT

## 2024-05-01 NOTE — Consult Note (Signed)
 [facility]  HISTORY AND PHYSICAL  Brooke [name] is an 73 y.o. female.    Chief Complaint: abd pain n/v  HPI: Pt is a 31F with a PMH sig for HTN, HLD, DM II, CKD 3, gout who presented with CP and ileus.  Had been admitted Sept with similar symptoms related to Mounjaro  use.  Was told to hold the dose but got one on 10/6.  Comes in with recurrent symptoms.    AXR showed ileus vs SBO, NGT placed to LIWS, now clamped.  Repeat AXR after clamping pending.    Na is up to 156, K 3.0, Cr 1.29.  Is on d5W at 125/ hr, previously NS and LR.  In this setting we are asked to see.    PMH: Past Medical History:  Diagnosis Date   Alopecia    Anxiety    Aortic atherosclerosis    Asthma    FOLLOWED BY PCP   Eczema    GERD (gastroesophageal reflux disease)    Gout    04-28-2018--- per pt stable , as been a while since last episode   Heart murmur    History of colon polyps    History of syncope 2015   Hyperlipidemia    Hypertension    Hypothyroidism    OA (osteoarthritis) of knee    bilateral   PVC's (premature ventricular contractions)    Toxic multinodular goiter    03/ 2003  s/p RAI   Type 2 diabetes mellitus (HCC)    followed by pcp   Ventricular tachycardia, polymorphic (HCC) 01/04/2014   primary cardiologist-- dr wilbert turner (hx monitor 2015 showed couplet PVCs, as trigger)   Wears glasses    Wears partial dentures    upper   PSH: Past Surgical History:  Procedure Laterality Date   ABDOMINAL HYSTERECTOMY  10/22/1999   WITH BSO   CATARACT EXTRACTION W/ INTRAOCULAR LENS IMPLANT Left YRS AGO   COLONOSCOPY     EXCISION ABDOMINAL WALL MASS  12-20-2005   dr merrilyn @[facility]    neruofibroma   LAPAROSCOPIC CHOLECYSTECTOMY  10/01/2002   dr merrilyn @[facility]    LEFT HEART CATHETERIZATION WITH CORONARY ANGIOGRAM N/A 01/07/2014   Procedure: LEFT HEART CATHETERIZATION WITH CORONARY ANGIOGRAM;  Surgeon: Peter M Swaziland, MD;  Location: [facility] CATH LAB;  Service: Cardiovascular;  Laterality:  N/A;   RASTELLI PROCEDURE  6/98 neg   RENAL ARTERY STENT Left 11/2003   angioplasty and stenting   RENAL ARTERY STENT Left 02/2005   re-stenting   TOTAL KNEE ARTHROPLASTY Right 05/05/2018   Procedure: RIGHT TOTAL KNEE ARTHROPLASTY;  Surgeon: Liam Lerner, MD;  Location: [facility];  Service: Orthopedics;  Laterality: Right;   TOTAL KNEE ARTHROPLASTY Left 09/30/2018   Procedure: TOTAL KNEE ARTHROPLASTY;  Surgeon: Sheril Coy, MD;  Location: [facility];  Service: Orthopedics;  Laterality: Left;    Past Medical History:  Diagnosis Date   Alopecia    Anxiety    Aortic atherosclerosis    Asthma    FOLLOWED BY PCP   Eczema    GERD (gastroesophageal reflux disease)    Gout    04-28-2018--- per pt stable , as been a while since last episode   Heart murmur    History of colon polyps    History of syncope 2015   Hyperlipidemia    Hypertension    Hypothyroidism    OA (osteoarthritis) of knee    bilateral   PVC's (premature ventricular contractions)    Toxic multinodular goiter  03/ 2003  s/p RAI   Type 2 diabetes mellitus (HCC)    followed by pcp   Ventricular tachycardia, polymorphic (HCC) 01/04/2014   primary cardiologist-- dr wilbert turner (hx monitor 2015 showed couplet PVCs, as trigger)   Wears glasses    Wears partial dentures    upper    Medications:  Scheduled:  insulin  aspart  0-9 Units Subcutaneous TID WC   ondansetron  (ZOFRAN ) IV  4 mg Intravenous Once   sodium bicarbonate   650 mg Per Tube BID    Medications Prior to Admission  Medication Sig Dispense Refill   albuterol  (VENTOLIN  HFA) 108 (90 Base) MCG/ACT inhaler Inhale 2 puffs into the lungs every 4 (four) hours as needed for wheezing or shortness of breath. 18 g 1   allopurinol  (ZYLOPRIM ) 100 MG tablet TAKE 1 TABLET BY MOUTH EVERY DAY (Patient taking differently: Take 100 mg by mouth daily as needed (for gout symptoms).) 90 tablet 3   atropine 1 % ophthalmic solution Place 1 drop into the left eye daily.      Azelastine  HCl 137 MCG/SPRAY SOLN PLACE 2 SPRAYS INTO BOTH NOSTRILS 2 (TWO) TIMES DAILY. USE IN EACH NOSTRIL AS DIRECTED (Patient taking differently: Place 2 sprays into the nose in the morning and at bedtime.) 90 mL 4   brimonidine  (ALPHAGAN ) 0.2 % ophthalmic solution 3 (three) times daily. 1 drop in the left eye three times daily (Patient taking differently: Place 1 drop into the left eye in the morning and at bedtime.)     cetirizine  (ZYRTEC ) 10 MG tablet Take 10 mg by mouth daily.     citalopram  (CELEXA ) 40 MG tablet TAKE 1 TABLET BY MOUTH EVERY DAY 90 tablet 3   clonazePAM  (KLONOPIN ) 0.5 MG tablet Take 1 tablet (0.5 mg total) by mouth daily as needed (vertigo). 90 tablet 1   colchicine  0.6 MG tablet TAKE 0.5 TABLETS (0.3 MG TOTAL) BY MOUTH DAILY AS NEEDED (GOUT OR PSUEDOGOUT PAIN). 45 tablet 1   diltiazem  (CARDIZEM  CD) 240 MG 24 hr capsule Take 1 capsule (240 mg total) by mouth daily. 90 capsule 3   famotidine  (PEPCID ) 20 MG tablet Take 1 tablet (20 mg total) by mouth 2 (two) times daily. 60 tablet 5   Fluticasone -Umeclidin-Vilant (TRELEGY ELLIPTA ) 200-62.5-25 MCG/ACT AEPB Inhale 1 puff into the lungs daily. 28 each 5   JARDIANCE  25 MG TABS tablet TAKE 1 TABLET BY MOUTH EVERY DAY BEFORE BREAKFAST (Patient taking differently: Take 25 mg by mouth daily before breakfast.) 90 tablet 3   latanoprost  (XALATAN ) 0.005 % ophthalmic solution Place 1 drop into the left eye at bedtime.     meclizine  (ANTIVERT ) 25 MG tablet TAKE 1 TABLET BY MOUTH 3 TIMES A DAY AS NEEDED FOR DIZZINESS (Patient taking differently: Take 25 mg by mouth 3 (three) times daily as needed for dizziness.) 90 tablet 1   metoprolol  succinate (TOPROL -XL) 100 MG 24 hr tablet TAKE 1 TABLET BY MOUTH 2 (TWO) TIMES DAILY. TAKE WITH OR IMMEDIATELY FOLLOWING A MEAL. (Patient taking differently: Take 100 mg by mouth in the morning and at bedtime.) 180 tablet 0   omeprazole  (PRILOSEC) 40 MG capsule Take 1 capsule (40 mg total) by mouth daily.  (Patient taking differently: Take 40 mg by mouth See admin instructions. Take 40 mg by mouth 30 minutes prior to breakfast and supper) 90 capsule 3   ondansetron  (ZOFRAN -ODT) 4 MG disintegrating tablet Take 1 tablet (4 mg total) by mouth every 8 (eight) hours as needed for nausea or  vomiting. (Patient taking differently: Take 4 mg by mouth every 8 (eight) hours as needed for nausea or vomiting (dissolve orally).) 30 tablet 0   rosuvastatin  (CRESTOR ) 40 MG tablet TAKE 1 TABLET BY MOUTH EVERY DAY 90 tablet 1   traZODone  (DESYREL ) 100 MG tablet TAKE 1 TABLET (100 MG TOTAL) BY MOUTH AT BEDTIME AS NEEDED. FOR SLEEP (Patient taking differently: Take 100 mg by mouth at bedtime.) 90 tablet 1   TYLENOL  500 MG tablet Take 1,000 mg by mouth every 8 (eight) hours as needed for mild pain (pain score 1-3) (or headaches).     dorzolamide  (TRUSOPT ) 2 % ophthalmic solution Place 1 drop into the left eye 3 (three) times daily.     levocetirizine (XYZAL ) 5 MG tablet Take 1 tablet (5 mg total) by mouth daily. 30 tablet 5   potassium chloride  (KLOR-CON  10) 10 MEQ tablet Take 2 tablets (20 mEq total) by mouth daily for 5 days. (Patient not taking: Reported on 04/28/2024) 10 tablet 0   tirzepatide  (MOUNJARO ) 5 MG/0.5ML Pen Inject 5 mg into the skin once a week. (Patient not taking: Reported on 04/28/2024) 6 mL 3    ALLERGIES:   Allergies  Allergen Reactions   Metformin  And Related Nausea And Vomiting   Ace Inhibitors Swelling and Other (See Comments)    Ankles swell   Codeine Nausea And Vomiting and Rash   Hydrocodone  Nausea And Vomiting   Tramadol  Nausea And Vomiting   Tylenol  [Acetaminophen ] Itching, Rash and Other (See Comments)    Allergic to prescription strength Tylenol . The patient is taking 500 mg Tylenol  in 2025, however.    FAM HX: Family History  Problem Relation Age of Onset   Diabetes Mother    Heart disease Father    Colon cancer Neg Hx    Esophageal cancer Neg Hx    Rectal cancer Neg Hx     Stomach cancer Neg Hx    BRCA 1/2 Neg Hx    Breast cancer Neg Hx     Social History:   reports that she has never smoked. She has never been exposed to tobacco smoke. She has never used smokeless tobacco. She reports that she does not drink alcohol and does not use drugs.  ROS: ROS: all other systems reviewe and are negative except as per HPI  Blood pressure (!) 157/88, pulse (!) 121, temperature 98.7 F (37.1 C), resp. rate 18, height 5' 3 (1.6 m), weight 80.7 kg, SpO2 100%. PHYSICAL EXAM: Physical Exam GEN appears uncomfortable HEENT EOMI PERRL NECK flat neck veins PULM clear CV tachy ABD very quiet bowel sounds, slightly distended EXT no LE edema SKIN: slight skin tenting NEURO: Aao x 3, slightly tremulous    Results for orders placed or performed during the hospital encounter of 04/27/24 (from the past 48 hours)  Glucose, capillary     Status: Abnormal   Collection Time: 04/29/24  5:04 PM  Result Value Ref Range   Glucose-Capillary 114 (H) 70 - 99 mg/dL    Comment: Glucose reference range applies only to samples taken after fasting for at least 8 hours.  Glucose, capillary     Status: Abnormal   Collection Time: 04/29/24  8:16 PM  Result Value Ref Range   Glucose-Capillary 114 (H) 70 - 99 mg/dL    Comment: Glucose reference range applies only to samples taken after fasting for at least 8 hours.   Comment 1 Notify RN    Comment 2 Document in Chart   Glucose,  capillary     Status: Abnormal   Collection Time: 04/29/24 11:46 PM  Result Value Ref Range   Glucose-Capillary 111 (H) 70 - 99 mg/dL    Comment: Glucose reference range applies only to samples taken after fasting for at least 8 hours.   Comment 1 Notify RN    Comment 2 Document in Chart   Glucose, capillary     Status: Abnormal   Collection Time: 04/30/24 12:13 AM  Result Value Ref Range   Glucose-Capillary 118 (H) 70 - 99 mg/dL    Comment: Glucose reference range applies only to samples taken after fasting  for at least 8 hours.  Glucose, capillary     Status: Abnormal   Collection Time: 04/30/24  4:12 AM  Result Value Ref Range   Glucose-Capillary 102 (H) 70 - 99 mg/dL    Comment: Glucose reference range applies only to samples taken after fasting for at least 8 hours.   Comment 1 Notify RN    Comment 2 Document in Chart   CBC with Differential/Platelet     Status: Abnormal   Collection Time: 04/30/24  4:31 AM  Result Value Ref Range   WBC 4.8 4.0 - 10.5 K/uL   RBC 3.95 3.87 - 5.11 MIL/uL   Hemoglobin 10.7 (L) 12.0 - 15.0 g/dL   HCT 65.9 (L) 63.9 - 53.9 %   MCV 86.1 80.0 - 100.0 fL   MCH 27.1 26.0 - 34.0 pg   MCHC 31.5 30.0 - 36.0 g/dL   RDW 80.1 (H) 88.4 - 84.4 %   Platelets 258 150 - 400 K/uL   nRBC 0.0 0.0 - 0.2 %   Neutrophils Relative % 57 %   Neutro Abs 3.5 1.7 - 7.7 K/uL   Band Neutrophils 15 %   Lymphocytes Relative 25 %   Lymphs Abs 1.2 0.7 - 4.0 K/uL   Monocytes Relative 3 %   Monocytes Absolute 0.1 0.1 - 1.0 K/uL   Eosinophils Relative 0 %   Eosinophils Absolute 0.0 0.0 - 0.5 K/uL   Basophils Relative 0 %   Basophils Absolute 0.0 0.0 - 0.1 K/uL   WBC Morphology See Note     Comment: INCREASED BANDS (>20% BANDS) Performed at [facility], [address] [place], [place] [address]   Basic metabolic panel with GFR     Status: Abnormal   Collection Time: 04/30/24  4:31 AM  Result Value Ref Range   Sodium 152 (H) 135 - 145 mmol/L   Potassium 3.0 (L) 3.5 - 5.1 mmol/L   Chloride 121 (H) 98 - 111 mmol/L   CO2 15 (L) 22 - 32 mmol/L   Glucose, Bld 104 (H) 70 - 99 mg/dL    Comment: Glucose reference range applies only to samples taken after fasting for at least 8 hours.   BUN 26 (H) 8 - 23 mg/dL   Creatinine, Ser 8.34 (H) 0.44 - 1.00 mg/dL   Calcium  8.9 8.9 - 10.3 mg/dL   GFR, Estimated 33 (L) >60 mL/min    Comment: (NOTE) Calculated using the CKD-EPI Creatinine Equation (2021)    Anion gap 16 (H) 5 - 15    Comment: Performed at [facility], [address] [place], [place] [address]  Glucose, capillary     Status: Abnormal   Collection Time: 04/30/24  7:39 AM  Result Value Ref Range   Glucose-Capillary 104 (H) 70 - 99 mg/dL    Comment: Glucose reference range applies only to  samples taken after fasting for at least 8 hours.   Comment 1 Notify RN   Glucose, capillary     Status: Abnormal   Collection Time: 04/30/24 11:36 AM  Result Value Ref Range   Glucose-Capillary 108 (H) 70 - 99 mg/dL    Comment: Glucose reference range applies only to samples taken after fasting for at least 8 hours.   Comment 1 Notify RN   Basic metabolic panel     Status: Abnormal   Collection Time: 04/30/24 12:00 PM  Result Value Ref Range   Sodium 155 (H) 135 - 145 mmol/L   Potassium 3.1 (L) 3.5 - 5.1 mmol/L   Chloride 124 (H) 98 - 111 mmol/L   CO2 17 (L) 22 - 32 mmol/L   Glucose, Bld 106 (H) 70 - 99 mg/dL    Comment: Glucose reference range applies only to samples taken after fasting for at least 8 hours.   BUN 27 (H) 8 - 23 mg/dL   Creatinine, Ser 8.23 (H) 0.44 - 1.00 mg/dL   Calcium  9.0 8.9 - 10.3 mg/dL   GFR, Estimated 30 (L) >60 mL/min    Comment: (NOTE) Calculated using the CKD-EPI Creatinine Equation (2021)    Anion gap 15 5 - 15    Comment: Performed at [facility], [address], [place], [place] [address]  Glucose, capillary     Status: Abnormal   Collection Time: 04/30/24  4:47 PM  Result Value Ref Range   Glucose-Capillary 113 (H) 70 - 99 mg/dL    Comment: Glucose reference range applies only to samples taken after fasting for at least 8 hours.   Comment 1 Notify RN   Glucose, capillary     Status: Abnormal   Collection Time: 04/30/24  9:38 PM  Result Value Ref Range   Glucose-Capillary 114 (H) 70 - 99 mg/dL    Comment: Glucose reference range applies only to samples taken after fasting for at least 8 hours.   Comment 1 Notify RN    Comment 2 Document in Chart   Basic metabolic  panel with GFR     Status: Abnormal   Collection Time: 05/01/24  4:16 AM  Result Value Ref Range   Sodium 156 (H) 135 - 145 mmol/L   Potassium 3.0 (L) 3.5 - 5.1 mmol/L   Chloride 126 (H) 98 - 111 mmol/L   CO2 17 (L) 22 - 32 mmol/L   Glucose, Bld 106 (H) 70 - 99 mg/dL    Comment: Glucose reference range applies only to samples taken after fasting for at least 8 hours.   BUN 29 (H) 8 - 23 mg/dL   Creatinine, Ser 8.01 (H) 0.44 - 1.00 mg/dL   Calcium  9.0 8.9 - 10.3 mg/dL   GFR, Estimated 26 (L) >60 mL/min    Comment: (NOTE) Calculated using the CKD-EPI Creatinine Equation (2021)    Anion gap 13 5 - 15    Comment: Performed at [facility], [address], [place], [place] [address]  CBC with Differential/Platelet     Status: Abnormal   Collection Time: 05/01/24  4:16 AM  Result Value Ref Range   WBC 6.4 4.0 - 10.5 K/uL   RBC 3.96 3.87 - 5.11 MIL/uL   Hemoglobin 10.5 (L) 12.0 - 15.0 g/dL   HCT 65.9 (L) 63.9 - 53.9 %   MCV 85.9 80.0 - 100.0 fL   MCH 26.5 26.0 - 34.0 pg   MCHC 30.9 30.0 - 36.0 g/dL  RDW 19.8 (H) 11.5 - 15.5 %   Platelets 236 150 - 400 K/uL   nRBC 0.0 0.0 - 0.2 %   Neutrophils Relative % 61 %   Neutro Abs 3.9 1.7 - 7.7 K/uL   Lymphocytes Relative 15 %   Lymphs Abs 1.0 0.7 - 4.0 K/uL   Monocytes Relative 22 %   Monocytes Absolute 1.4 (H) 0.1 - 1.0 K/uL   Eosinophils Relative 0 %   Eosinophils Absolute 0.0 0.0 - 0.5 K/uL   Basophils Relative 1 %   Basophils Absolute 0.0 0.0 - 0.1 K/uL   WBC Morphology See Note     Comment: INCREASED BANDS (>20% BANDS)   Smear Review Normal platelet morphology    Immature Granulocytes 1 %   Abs Immature Granulocytes 0.08 (H) 0.00 - 0.07 K/uL   Burr Cells PRESENT     Comment: Performed at [facility], [address] [place], [place] [address]  Glucose, capillary     Status: Abnormal   Collection Time: 05/01/24  7:34 AM  Result Value Ref Range   Glucose-Capillary 106 (H) 70 - 99 mg/dL     Comment: Glucose reference range applies only to samples taken after fasting for at least 8 hours.   Comment 1 Notify RN   Glucose, capillary     Status: Abnormal   Collection Time: 05/01/24 11:56 AM  Result Value Ref Range   Glucose-Capillary 121 (H) 70 - 99 mg/dL    Comment: Glucose reference range applies only to samples taken after fasting for at least 8 hours.    VAS US  RENAL ARTERY DUPLEX Result Date: 05/01/2024 ABDOMINAL VISCERAL Patient Name:  THARON KITCH [facility]  Date of Exam:   05/01/2024 Medical Rec #: [id number]       Accession #:    [id number] Date of Birth: 11-15-1950       Patient Gender: F Patient Age:   39 years Exam Location:  [facility] Procedure:      VAS US  RENAL ARTERY DUPLEX Referring Phys: [id number] JENNIFER YATES -------------------------------------------------------------------------------- Indications: Acute kidney injury [302455] High Risk Factors: Hypertension, hyperlipidemia, Diabetes. Limitations: Air/bowel gas, obesity, patient discomfort and patient movement, patient respiratory disturbance. Comparison Study: No prior studies. Performing Technologist: Cordella Collet RVT  Examination Guidelines: A complete evaluation includes B-mode imaging, spectral Doppler, color Doppler, and power Doppler as needed of all accessible portions of each vessel. Bilateral testing is considered an integral part of a complete examination. Limited examinations for reoccurring indications may be performed as noted.  Duplex Findings: +--------------------+--------+--------+------+--------------+ Mesenteric          PSV cm/sEDV cm/sPlaque   Comments    +--------------------+--------+--------+------+--------------+ Aorta Mid             101      19                        +--------------------+--------+--------+------+--------------+ Celiac Artery Origin                      Not visualized +--------------------+--------+--------+------+--------------+ SMA Origin                                 Not visualized +--------------------+--------+--------+------+--------------+    +------------------+--------+--------+-------+ Right Renal ArteryPSV cm/sEDV cm/sComment +------------------+--------+--------+-------+ Origin               52      22           +------------------+--------+--------+-------+  Proximal             85      32           +------------------+--------+--------+-------+ Mid                  82      25           +------------------+--------+--------+-------+ Distal               93      23           +------------------+--------+--------+-------+ +-----------------+--------+--------+-------+ Left Renal ArteryPSV cm/sEDV cm/sComment +-----------------+--------+--------+-------+ Origin              52      14           +-----------------+--------+--------+-------+ Proximal            59      18           +-----------------+--------+--------+-------+ Mid                103      26           +-----------------+--------+--------+-------+ Distal              64      17           +-----------------+--------+--------+-------+ +------------+--------+--------+----+-----------+--------+--------+----+ Right KidneyPSV cm/sEDV cm/sRI  Left KidneyPSV cm/sEDV cm/sRI   +------------+--------+--------+----+-----------+--------+--------+----+ Upper Pole  41      13      0.68Upper Pole 52      10      0.80 +------------+--------+--------+----+-----------+--------+--------+----+ Mid         33      9       0.        51      13      0.74 +------------+--------+--------+----+-----------+--------+--------+----+ Lower Pole  42      12      0.70Lower Pole 53      14      0.73 +------------+--------+--------+----+-----------+--------+--------+----+ Hilar       93      19      0.79Hilar      70      19      0.73 +------------+--------+--------+----+-----------+--------+--------+----+  +------------------+----+------------------+----+ Right Kidney          Left Kidney            +------------------+----+------------------+----+ RAR                   RAR                    +------------------+----+------------------+----+ RAR (manual)      0.92RAR (manual)      1.02 +------------------+----+------------------+----+ Cortex                Cortex                 +------------------+----+------------------+----+ Cortex thickness      Corex thickness        +------------------+----+------------------+----+ Kidney length (cm)9.50Kidney length (cm)9.00 +------------------+----+------------------+----+   Summary: Renal:  Right: Normal size right kidney. Normal right Resisitive Index. No        evidence of right renal artery stenosis. Left:  Normal size of left kidney. Normal left Resistive Index. No        evidence of left renal artery stenosis.  *See table(s) above for measurements and observations.     Preliminary  DG Abd 1 View Result Date: 05/01/2024 EXAM: 1 VIEW XRAY OF THE ABDOMEN 05/01/2024 05:42:00 AM COMPARISON: 04/30/2024 CLINICAL HISTORY: Ileus (HCC) [id number]. ileus. FINDINGS: LINES, TUBES AND DEVICES: Enteric tube terminates in the stomach. Left renal artery stent noted. BOWEL: Persistent and progressive dilated small bowel loops in the left mid and right lower quadrant abdomen. SOFT TISSUES: Surgical clips in the right upper quadrant of the abdomen. No opaque urinary calculi. BONES: No acute osseous abnormality. IMPRESSION: 1. Persistent and progressive small bowel dilatation compatible with either mid to distal small bowel obstruction versus ileus. 2. Enteric tube terminates in the stomach. 3. Postsurgical changes with right upper quadrant surgical clips and left renal artery stent. Electronically signed by: Waddell Calk MD 05/01/2024 08:34 AM EDT RP Workstation: [id number]   DG Abd 1 View Result Date: 04/30/2024 EXAM: 1 VIEW XRAY OF THE ABDOMEN 04/30/2024  05:20:00 AM COMPARISON: 04/29/2024 CLINICAL HISTORY: Ileus (HCC) [id number]. ileus FINDINGS: LINES, TUBES AND DEVICES: Enteric tube is in place with tip in side port below the GE junction. The tube has been slightly retracted with the side hole approximately 2 cm below the GE junction. Left renal artery stent noted. BOWEL: Single dilated loop of small bowel within the left upper quadrant, up to 3.8 cm in diameter. SOFT TISSUES: Right upper quadrant surgical clips noted. No opaque urinary calculi. BONES: No acute osseous abnormality. IMPRESSION: 1. Single dilated loop of small bowel in the left abdomen measuring up to 3.8 cm, suggestive of focal ileus. 2. Slight retraction of the enteric tube. The side hole for the tube is 2 cm below the ge junction. Electronically signed by: Waddell Calk MD 04/30/2024 07:46 AM EDT RP Workstation: [id number]    Assessment/Plan  Hypernatremia:  sig FW deficit.  Already on hypotonic fluids but likely needs more isotonic fluid bolus administration too  - 1L LR bolus now, may need another one today as well  - agree with D5W at 125 /hr  - repeat PM labs pending  2.  Hypokalemia:  - received IV and PO replacement  - follow  - will also add on magnesium   3.  AKI on CKD 3  - hypovolemia, mild, expect to improve with fluid resuscitation  - renal vascular duplex negative (has known L stent)  4.  SBO/ Ileus  - in the setting of Mounjaro   - would not retrial GLP-1 or GIP/GLP-1  - GI following, has NGT, repeat AXR pending  5.  Dispo: admitted  Brooke [name], Brooke [name] 05/01/2024, 1:48 PM

## 2024-05-02 ENCOUNTER — Inpatient Hospital Stay (HOSPITAL_COMMUNITY)

## 2024-05-02 DIAGNOSIS — E870 Hyperosmolality and hypernatremia: Secondary | ICD-10-CM | POA: Diagnosis not present

## 2024-05-02 DIAGNOSIS — K56609 Unspecified intestinal obstruction, unspecified as to partial versus complete obstruction: Secondary | ICD-10-CM | POA: Diagnosis not present

## 2024-05-02 DIAGNOSIS — Z4682 Encounter for fitting and adjustment of non-vascular catheter: Secondary | ICD-10-CM | POA: Diagnosis not present

## 2024-05-02 DIAGNOSIS — R109 Unspecified abdominal pain: Secondary | ICD-10-CM | POA: Diagnosis not present

## 2024-05-02 DIAGNOSIS — R935 Abnormal findings on diagnostic imaging of other abdominal regions, including retroperitoneum: Secondary | ICD-10-CM | POA: Diagnosis not present

## 2024-05-02 DIAGNOSIS — R000 Tachycardia, unspecified: Secondary | ICD-10-CM | POA: Diagnosis not present

## 2024-05-02 DIAGNOSIS — K567 Ileus, unspecified: Secondary | ICD-10-CM | POA: Diagnosis not present

## 2024-05-02 DIAGNOSIS — E876 Hypokalemia: Secondary | ICD-10-CM | POA: Diagnosis not present

## 2024-05-02 LAB — RENAL FUNCTION PANEL
Albumin: 2.8 g/dL — ABNORMAL LOW (ref 3.5–5.0)
Albumin: 2.6 g/dL — ABNORMAL LOW (ref 3.5–5.0)
Anion gap: 9 (ref 5–15)
Anion gap: 10 (ref 5–15)
BUN: 22 mg/dL (ref 8–23)
BUN: 18 mg/dL (ref 8–23)
CO2: 20 mmol/L — ABNORMAL LOW (ref 22–32)
CO2: 19 mmol/L — ABNORMAL LOW (ref 22–32)
Calcium: 9 mg/dL (ref 8.9–10.3)
Calcium: 8.6 mg/dL — ABNORMAL LOW (ref 8.9–10.3)
Chloride: 127 mmol/L — ABNORMAL HIGH (ref 98–111)
Chloride: 122 mmol/L — ABNORMAL HIGH (ref 98–111)
Creatinine, Ser: 1.91 mg/dL — ABNORMAL HIGH (ref 0.44–1.00)
Creatinine, Ser: 1.84 mg/dL — ABNORMAL HIGH (ref 0.44–1.00)
GFR, Estimated: 27 mL/min — ABNORMAL LOW (ref >60)
GFR, Estimated: 29 mL/min — ABNORMAL LOW (ref >60)
Glucose, Bld: 111 mg/dL — ABNORMAL HIGH (ref 70–99)
Glucose, Bld: 139 mg/dL — ABNORMAL HIGH (ref 70–99)
Phosphorus: <1 mg/dL — CL (ref 2.5–4.6)
Phosphorus: 1.8 mg/dL — ABNORMAL LOW (ref 2.5–4.6)
Potassium: 3.4 mmol/L — ABNORMAL LOW (ref 3.5–5.1)
Potassium: 3.6 mmol/L (ref 3.5–5.1)
Sodium: 155 mmol/L — ABNORMAL HIGH (ref 135–145)
Sodium: 151 mmol/L — ABNORMAL HIGH (ref 135–145)

## 2024-05-02 LAB — CBC WITH DIFFERENTIAL/PLATELET
Abs Immature Granulocytes: 0.24 K/uL — ABNORMAL HIGH (ref 0.00–0.07)
Basophils Absolute: 0.1 K/uL (ref 0.0–0.1)
Basophils Relative: 1 %
Eosinophils Absolute: 0 K/uL (ref 0.0–0.5)
Eosinophils Relative: 0 %
HCT: 33.5 % — ABNORMAL LOW (ref 36.0–46.0)
Hemoglobin: 10.3 g/dL — ABNORMAL LOW (ref 12.0–15.0)
Immature Granulocytes: 3 %
Lymphocytes Relative: 15 %
Lymphs Abs: 1.3 K/uL (ref 0.7–4.0)
MCH: 26.7 pg (ref 26.0–34.0)
MCHC: 30.7 g/dL (ref 30.0–36.0)
MCV: 86.8 fL (ref 80.0–100.0)
Monocytes Absolute: 1.5 K/uL — ABNORMAL HIGH (ref 0.1–1.0)
Monocytes Relative: 17 %
Neutro Abs: 5.8 K/uL (ref 1.7–7.7)
Neutrophils Relative %: 64 %
Platelets: 226 K/uL (ref 150–400)
RBC: 3.86 MIL/uL — ABNORMAL LOW (ref 3.87–5.11)
RDW: 20 % — ABNORMAL HIGH (ref 11.5–15.5)
WBC: 9 K/uL (ref 4.0–10.5)
nRBC: 0.3 % — ABNORMAL HIGH (ref 0.0–0.2)

## 2024-05-02 LAB — BASIC METABOLIC PANEL WITH GFR
Anion gap: 9 (ref 5–15)
BUN: 22 mg/dL (ref 8–23)
CO2: 20 mmol/L — ABNORMAL LOW (ref 22–32)
Calcium: 9 mg/dL (ref 8.9–10.3)
Chloride: 126 mmol/L — ABNORMAL HIGH (ref 98–111)
Creatinine, Ser: 1.94 mg/dL — ABNORMAL HIGH (ref 0.44–1.00)
GFR, Estimated: 27 mL/min — ABNORMAL LOW (ref >60)
Glucose, Bld: 111 mg/dL — ABNORMAL HIGH (ref 70–99)
Potassium: 3.4 mmol/L — ABNORMAL LOW (ref 3.5–5.1)
Sodium: 156 mmol/L — ABNORMAL HIGH (ref 135–145)

## 2024-05-02 LAB — GLUCOSE, CAPILLARY
Glucose-Capillary: 135 mg/dL — ABNORMAL HIGH (ref 70–99)
Glucose-Capillary: 105 mg/dL — ABNORMAL HIGH (ref 70–99)
Glucose-Capillary: 153 mg/dL — ABNORMAL HIGH (ref 70–99)
Glucose-Capillary: 129 mg/dL — ABNORMAL HIGH (ref 70–99)

## 2024-05-02 LAB — MAGNESIUM: Magnesium: 2 mg/dL (ref 1.7–2.4)

## 2024-05-02 MED ORDER — POTASSIUM CHLORIDE 10 MEQ/100ML IV SOLN
10.0000 meq | INTRAVENOUS | Status: AC
  Administered 2024-05-02 (×2): 10 meq via INTRAVENOUS
  Filled 2024-05-02 (×2): qty 100

## 2024-05-02 MED ORDER — LACTATED RINGERS IV BOLUS
1000.0000 mL | Freq: Once | INTRAVENOUS | Status: AC
Start: 2024-05-02 — End: 2024-05-02
  Administered 2024-05-02: 1000 mL via INTRAVENOUS

## 2024-05-02 MED ORDER — DEXTROSE 5 % IV SOLN
INTRAVENOUS | Status: AC
Start: 2024-05-02 — End: 2024-05-03

## 2024-05-02 MED ORDER — POTASSIUM PHOSPHATES 15 MMOLE/5ML IV SOLN
30.0000 mmol | Freq: Once | INTRAVENOUS | Status: AC
  Administered 2024-05-02: 30 mmol via INTRAVENOUS
  Filled 2024-05-02: qty 10

## 2024-05-02 MED ORDER — POTASSIUM PHOSPHATES 15 MMOLE/5ML IV SOLN: 30.0000 mmol | Freq: Once | INTRAVENOUS | Status: DC

## 2024-05-02 NOTE — Progress Notes (Signed)
 Progress Note   Patient: Brooke [name] FMW:[id number] DOB: 11-23-1950 DOA: 04/27/2024     3 DOS: the patient was seen and examined on 05/02/2024   Brief hospital course: 73yo with h/o DM, HTN, stage 3 CKD, and COPD with recent admission for n/v associated with Mounjaro  (continued use) and COVID who presented on 10/7 with abdominal pain.  CT with ileus.  Assessment and Plan:  Ileus  vs. SBO Multiple days of abdominal pain, decreased po intake following recent admission for colitis in setting of Mounjaro  use  CT imaging concerning for ileus on presentation Noted recurrent Mounjaro  use on 10/6 after prior dc with rapid worsening of abd pain and nausea  NPO/IVF with pain control and antiemetics  STOP Mounjaro  GI consulting NG tube placed to LIWS, attempted to clamp tube today but she appears to be worsening Xray with persistent and progressive SB dilatation - SBO vs. Ileus Surgery consulted Currently appears to be ileus so clamping tube and attempting CLD   Hypernatremia Suspect that this is hypovolemic with output outpacing input Given boluses without improvement Increased IVF rate overnight without improvement Changed to D5W, more boluses Nephrology consulting More boluses Continuing to closely monitor Transferred to progressive care   AKI on CKD stage IIIb with chronic metabolic acidosis Has been worsening with hypernatremia, but slightly improved this AM Attempt to avoid nephrotoxic medications Recheck BMP in PM and again AM  Placed on sodium bicarb tabs 650 mg twice daily for 1 week during last hospitalization and now resumed with improvement from 16 -> 20 H/o L renal artery stent, stable renal US  with Duplex Nephrology consulted   Chest pain Nonspecific chest pain over multiple days Baseline history of cardiac evaluation including Cath 2015 showed normal coronary arteries Repeat nuclear stress test 12/2016 for CP showed small moderate intensity anteroapical defect that  was fixed and felt consistent with apical thinning, low risk study, EF 62%.  EKG w/ nonspecific changes  Trop negative x 2  Echo with preserved EF, grade 1 DD, no WMA Concern for LVOT obstruction on echo Consulted cardiology - will start IV metoprolol  5mg  q2h prn with plan to transition to metoprolol  50 mg po BID when taking PO Rhythm appears to have stabilized Cardiology is signed off   Hypokalemia/hypophosphatemia Replete K+ PO and IV, goal is >4 but still normal Replete with KPhos as needed   Hypertension Continue metoprolol , changed to IV for now Add prn IV hydralazine    Possible pancreatic mass CT renal study in 01/2023 with 8mm hyperdense region in pancreatic tail Recommended for MRI in 07/2024 Consider imaging sooner if ongoing concerns   COPD Stable, no active wheezing Continue Trelegy   Diabetes mellitus type 2, NIDDM Last A1c 6.2 on 8/6, good control Hold Jardiance  STOP Mounjaro  Start sensitive-scale sliding scale insulin    Depression Continue Celexa , trazodone    Chronic anemia, normocytic Baseline hemoglobin 9-10 Currently at baseline   Hyperlipidemia Continue statin   History of glaucoma Continue outpatient eyedrops   Class 1 obesity Body mass index is 31.53 kg/m.SABRA  Weight loss should be encouraged STOP GLP-1 Outpatient PCP/bariatric medicine f/u encouraged Significantly low or high BMI is associated with higher medical risk including morbidity and mortality          Consultants: GI Cardiology Nephrology Surgery PT   Procedures: None   Antibiotics: None   30 Day Unplanned Readmission Risk Score    Flowsheet Row ED to Hosp-Admission (Current) from 04/27/2024 in [place] 4TH FLOOR PROGRESSIVE CARE AND UROLOGY  30 Day Unplanned Readmission Risk Score (%) 23.79 Filed at 05/02/2024 0400    This score is the patient's risk of an unplanned readmission within 30 days of being discharged (0 -100%). The score is based on dignosis, age, lab  data, medications, orders, and past utilization.   Low:  0-14.9   Medium: 15-21.9   High: 22-29.9   Extreme: 30 and above           Subjective: Still feeling poorly.  No specific complaints.   Objective: Vitals:   05/02/24 1006 05/02/24 1423  BP: (!) 150/84 (!) 172/90  Pulse: (!) 121 (!) 127  Resp:  19  Temp:  98.9 F (37.2 C)  SpO2: 96% 100%    Intake/Output Summary (Last 24 hours) at 05/02/2024 1433 Last data filed at 05/02/2024 1243 Gross per 24 hour  Intake 5054.77 ml  Output --  Net 5054.77 ml   Filed Weights   04/28/24 0912 05/02/24 0548  Weight: 80.7 kg 81 kg    Exam:  General:  Appears calm but uncomfortable, NAD Eyes:  normal lids, iris ENT:  grossly normal hearing, lips & tongue, mmm; NG tube in place Cardiovascular:  RRR. No LE edema.  Respiratory:   CTA bilaterally with no wheezes/rales/rhonchi.  Normal respiratory effort. Abdomen:  soft, improved diffuse abdominal TTP, improved distention Skin:  no rash or induration seen on limited exam Musculoskeletal:  grossly normal tone BUE/BLE, good ROM, no bony abnormality Psychiatric:  blunted mood and affect, speech fluent and appropriate, AOx3 Neurologic:  CN 2-12 grossly intact, moves all extremities in coordinated fashion  Data Reviewed: I have reviewed the patient's lab results since admission.  Pertinent labs for today include:   Na++ 156, down from 158 K+ 3.4 CO2 20 Glucose 111 BUN 22/Creatinine 1.94/GFR 27, slightly improved Phos <1  Albumin 2.8 WBC 9 Hgb 10.3   Family Communication: None present; I was unable to reach her son by telephone today  Mobility: PT/OT Consulted and are recommending - Home Health Pt10/04/2024 1700    Code Status: Full Code   Disposition: Status is: Inpatient Remains inpatient appropriate because: ongoing management     Time spent: 50 minutes  Unresulted Labs (From admission, onward)     Start     Ordered   05/03/24 0500  CBC with  Differential/Platelet  Tomorrow morning,   R        05/02/24 1433   05/03/24 0500  Basic metabolic panel with GFR  Tomorrow morning,   R        05/02/24 1433   05/03/24 0500  Phosphorus  Tomorrow morning,   R        05/02/24 1433   05/02/24 1600  Renal function panel  Once-Timed,   TIMED        05/02/24 1313             Author: Delon Herald, MD 05/02/2024 2:33 PM  For on call review [email or web address].

## 2024-05-02 NOTE — Plan of Care (Signed)
 Patient was moved from NPO to clear liquids today.  She stood and turned to use the bedside code four times today, with one elimination being a non-liquid bowel movement.  No complaints of pain.  When attempting to move to [facility] at 0400, patient experienced SVTs and an increased HR over 150.  Slow decline to below 140 after returning to bed, but currently sitting in the 130s.

## 2024-05-02 NOTE — Progress Notes (Addendum)
   05/02/24 0019  Vitals  Temp 99 F (37.2 C)  Temp Source Oral  BP (!) 145/72  MAP (mmHg) 94  BP Location Right Arm  BP Method Automatic  Patient Position (if appropriate) Lying  Pulse Rate (!) 124  Pulse Rate Source Monitor  Resp 16  MEWS COLOR  MEWS Score Color Yellow  Oxygen  Therapy  SpO2 96 %  O2 Device Room Air   CMT reports pt is having burst of SVT every 10 to 20 sec lasting 2-3 minutes, pt is sleeping however noticed HR tachy in the 120's at rest. No SOB noted at this time. Will cont to monitor for changes and update as needed. SRP, RN

## 2024-05-02 NOTE — Progress Notes (Signed)
 Pt have had several large solid and liquid and brown stools, after ingesting the gastrografin solution. NGT suction golden yellow emesis. SRP, RN

## 2024-05-02 NOTE — Progress Notes (Addendum)
 HISTORY OF PRESENT ILLNESS:  Brooke [name] is a 73 y.o. female with past medical history as listed below.  Presented to the hospital with symptomatic ileus.  Etiology not clear.  Sitting in bed, uncomfortable secondary to NG tube.  Looks miserable.  Complains of abdominal pain, though less.  Still with potassium less than ideal level (3.4).  Normal white blood cell count.  X-ray somewhat improved.  She is passing flatus.  She wants something to drink.  Hospitalist in room at bedside  REVIEW OF SYSTEMS:  All non-GI ROS negative except for NG tube discomfort  Past Medical History:  Diagnosis Date   Alopecia    Anxiety    Aortic atherosclerosis    Asthma    FOLLOWED BY PCP   Eczema    GERD (gastroesophageal reflux disease)    Gout    04-28-2018--- per pt stable , as been a while since last episode   Heart murmur    History of colon polyps    History of syncope 2015   Hyperlipidemia    Hypertension    Hypothyroidism    OA (osteoarthritis) of knee    bilateral   PVC's (premature ventricular contractions)    Toxic multinodular goiter    03/ 2003  s/p RAI   Type 2 diabetes mellitus (HCC)    followed by pcp   Ventricular tachycardia, polymorphic (HCC) 01/04/2014   primary cardiologist-- dr wilbert turner (hx monitor 2015 showed couplet PVCs, as trigger)   Wears glasses    Wears partial dentures    upper    Past Surgical History:  Procedure Laterality Date   ABDOMINAL HYSTERECTOMY  10/22/1999   WITH BSO   CATARACT EXTRACTION W/ INTRAOCULAR LENS IMPLANT Left YRS AGO   COLONOSCOPY     EXCISION ABDOMINAL WALL MASS  12-20-2005   dr merrilyn @[facility]    neruofibroma   LAPAROSCOPIC CHOLECYSTECTOMY  10/01/2002   dr merrilyn @[facility]    LEFT HEART CATHETERIZATION WITH CORONARY ANGIOGRAM N/A 01/07/2014   Procedure: LEFT HEART CATHETERIZATION WITH CORONARY ANGIOGRAM;  Surgeon: Peter M Swaziland, MD;  Location: [facility] CATH LAB;  Service: Cardiovascular;  Laterality: N/A;   RASTELLI PROCEDURE  6/98 neg    RENAL ARTERY STENT Left 11/2003   angioplasty and stenting   RENAL ARTERY STENT Left 02/2005   re-stenting   TOTAL KNEE ARTHROPLASTY Right 05/05/2018   Procedure: RIGHT TOTAL KNEE ARTHROPLASTY;  Surgeon: Liam Lerner, MD;  Location: [facility] ORS;  Service: Orthopedics;  Laterality: Right;   TOTAL KNEE ARTHROPLASTY Left 09/30/2018   Procedure: TOTAL KNEE ARTHROPLASTY;  Surgeon: Sheril Coy, MD;  Location: [facility] ORS;  Service: Orthopedics;  Laterality: Left;    Social History Odis Wickey Buda  reports that she has never smoked. She has never been exposed to tobacco smoke. She has never used smokeless tobacco. She reports that she does not drink alcohol and does not use drugs.  family history includes Diabetes in her mother; Heart disease in her father.  Allergies  Allergen Reactions   Metformin  And Related Nausea And Vomiting   Ace Inhibitors Swelling and Other (See Comments)    Ankles swell   Codeine Nausea And Vomiting and Rash   Hydrocodone  Nausea And Vomiting   Tramadol  Nausea And Vomiting   Tylenol  [Acetaminophen ] Itching, Rash and Other (See Comments)    Allergic to prescription strength Tylenol . The patient is taking 500 mg Tylenol  in 2025, however.       PHYSICAL EXAMINATION: Vital signs: BP (!) 150/84   Pulse ROLLEN)  121   Temp 98.7 F (37.1 C) (Oral)   Resp (!) 21   Ht 5' 3 (1.6 m)   Wt 81 kg   SpO2 96%   BMI 31.63 kg/m  General: Well-developed, well-nourished, uncomfortable with NG tube in place.  Draining bile HEENT: Sclerae are anicteric, conjunctiva pink. Oral mucosa intact Lungs: Clear Heart: Regular Abdomen: soft, nontender, nondistended, no obvious ascites, no peritoneal signs, normal bowel sounds. No organomegaly.  Improved from yesterday Extremities: No edema Psychiatric: alert and oriented x3. Cooperative   ASSESSMENT:  1.  Ileus 2.  Hypokalemia and hypernatremia 3.  General Medical problems   PLAN:  1.  Continue IV fluids 2.  Continue to replete  potassium and correct sodium 3.  Avoid narcotics 4.  Increase activity (difficult with NG but can be clamped so that she can move around) 5.  Trend laboratories 6.  Repeat x-rays in a.m.  Norleen SAILOR. Abran Raddle., M.D. [facility] Division of Gastroenterology

## 2024-05-02 NOTE — Plan of Care (Signed)

## 2024-05-02 NOTE — Progress Notes (Signed)
 Ileus improving. Had multiple brief bursts of atrial tachycardia around 1 AM, but no true sustained atrial fibrillation. Rhythm has stabilized in the last 12 hours.  Continue IV metoprolol  as prescribed. Anticipate further improvement in the arrhythmia when she is able to take PO meds. Please re-consult as needed.

## 2024-05-02 NOTE — Progress Notes (Signed)
 Subjective/Chief Complaint: Patient reports some flatus, no BM today Plain films after SBO protocol showed less SB distention, contrast all the way to the rectum NG output not recorded  Objective: Vital signs in last 24 hours: Temp:  [98.3 F (36.8 C)-99.9 F (37.7 C)] 98.7 F (37.1 C) (10/11 0819) Pulse Rate:  [114-144] 144 (10/11 0822) Resp:  [16-21] 21 (10/11 0815) BP: (145-179)/(72-92) 157/92 (10/11 0822) SpO2:  [96 %-100 %] 97 % (10/11 0822) Weight:  [81 kg] 81 kg (10/11 0548) Last BM Date : 05/02/24  Intake/Output from previous day: 10/10 0701 - 10/11 0700 In: 4989.8 [I.V.:580.6; IV Piggyback:4409.2] Out: -  Intake/Output this shift: No intake/output data recorded.  NG tube in place and functioning Abd - soft, obese; mild epigastric tenderness  Lab Results:  Recent Labs    05/01/24 0416 05/02/24 0340  WBC 6.4 9.0  HGB 10.5* 10.3*  HCT 34.0* 33.5*  PLT 236 226   BMET Recent Labs    05/01/24 1830 05/02/24 0340  NA 158* 156*  155*  K 3.3* 3.4*  3.4*  CL 129* 126*  127*  CO2 19* 20*  20*  GLUCOSE 91 111*  111*  BUN 25* 22  22  CREATININE 1.91* 1.94*  1.91*  CALCIUM  9.0 9.0  9.0   PT/INR No results for input(s): LABPROT, INR in the last 72 hours. ABG No results for input(s): PHART, HCO3 in the last 72 hours.  Invalid input(s): PCO2, PO2  Studies/Results: DG Abd Portable 1V-Small Bowel Obstruction Protocol-initial, 8 hr delay Result Date: 05/02/2024 EXAM: 1 VIEW XRAY OF THE ABDOMEN 05/02/2024 05:30:00 AM COMPARISON: 05/01/2024 CLINICAL HISTORY: 8 hour SBO protocol image FINDINGS: LINES, TUBES AND DEVICES: Enteric tube stable in place. BOWEL: Mildly regressed small bowel distension. Enteric contrast material is identified within the distal small bowel, colon, and rectum. SOFT TISSUES: Stable cholecystectomy clips. Left renal artery region vascular stent again noted. No opaque urinary calculi. BONES: No acute osseous abnormality.  IMPRESSION: 1. Mildly regressed small bowel distension. 2. Enteric contrast present within the distal small bowel, colon, and rectum. Electronically signed by: Waddell Calk MD 05/02/2024 06:12 AM EDT RP Workstation: [id number]   VAS US  RENAL ARTERY DUPLEX Result Date: 05/01/2024 ABDOMINAL VISCERAL Patient Name:  Brooke [name] [facility]  Date of Exam:   05/01/2024 Medical Rec #: [id number]       Accession #:    [id number] Date of Birth: 18-May-1951       Patient Gender: F Patient Age:   73 years Exam Location:  [facility] Procedure:      VAS US  RENAL ARTERY DUPLEX Referring Phys: [id number] JENNIFER YATES -------------------------------------------------------------------------------- Indications: Acute kidney injury [302455] High Risk Factors: Hypertension, hyperlipidemia, Diabetes. Limitations: Air/bowel gas, obesity, patient discomfort and patient movement, patient respiratory disturbance. Comparison Study: No prior studies. Performing Technologist: Cordella Collet RVT  Examination Guidelines: A complete evaluation includes B-mode imaging, spectral Doppler, color Doppler, and power Doppler as needed of all accessible portions of each vessel. Bilateral testing is considered an integral part of a complete examination. Limited examinations for reoccurring indications may be performed as noted.  Duplex Findings: +--------------------+--------+--------+------+--------------+ Mesenteric          PSV cm/sEDV cm/sPlaque   Comments    +--------------------+--------+--------+------+--------------+ Aorta Mid             101      19                        +--------------------+--------+--------+------+--------------+  Celiac Artery Origin                      Not visualized +--------------------+--------+--------+------+--------------+ SMA Origin                                Not visualized +--------------------+--------+--------+------+--------------+    +------------------+--------+--------+-------+  Right Renal ArteryPSV cm/sEDV cm/sComment +------------------+--------+--------+-------+ Origin               52      22           +------------------+--------+--------+-------+ Proximal             85      32           +------------------+--------+--------+-------+ Mid                  82      25           +------------------+--------+--------+-------+ Distal               93      23           +------------------+--------+--------+-------+ +-----------------+--------+--------+-------+ Left Renal ArteryPSV cm/sEDV cm/sComment +-----------------+--------+--------+-------+ Origin              52      14           +-----------------+--------+--------+-------+ Proximal            59      18           +-----------------+--------+--------+-------+ Mid                103      26           +-----------------+--------+--------+-------+ Distal              64      17           +-----------------+--------+--------+-------+ +------------+--------+--------+----+-----------+--------+--------+----+ Right KidneyPSV cm/sEDV cm/sRI  Left KidneyPSV cm/sEDV cm/sRI   +------------+--------+--------+----+-----------+--------+--------+----+ Upper Pole  41      13      0.68Upper Pole 52      10      0.80 +------------+--------+--------+----+-----------+--------+--------+----+ Mid         33      9       0.        51      13      0.74 +------------+--------+--------+----+-----------+--------+--------+----+ Lower Pole  42      12      0.70Lower Pole 53      14      0.73 +------------+--------+--------+----+-----------+--------+--------+----+ Hilar       93      19      0.79Hilar      70      19      0.73 +------------+--------+--------+----+-----------+--------+--------+----+ +------------------+----+------------------+----+ Right Kidney          Left Kidney            +------------------+----+------------------+----+ RAR                    RAR                    +------------------+----+------------------+----+ RAR (manual)      0.92RAR (manual)      1.02 +------------------+----+------------------+----+ Cortex                Cortex                 +------------------+----+------------------+----+  Cortex thickness      Corex thickness        +------------------+----+------------------+----+ Kidney length (cm)9.50Kidney length (cm)9.00 +------------------+----+------------------+----+  Summary: Renal:  Right: Normal size right kidney. Normal right Resisitive Index. No        evidence of right renal artery stenosis. Left:  Normal size of left kidney. Normal left Resistive Index. No        evidence of left renal artery stenosis.  *See table(s) above for measurements and observations.  Diagnosing physician: Gaile New MD  Electronically signed by Gaile New MD on 05/01/2024 at 9:57:11 PM.    Final    DG Abd 1 View Result Date: 05/01/2024 CLINICAL DATA:  NG tube advanced. EXAM: ABDOMEN - 1 VIEW COMPARISON:  May 01, 2024 (4:27 p.m.) FINDINGS: A nasogastric tube is seen with its distal tip overlying the expected region of the proximal duodenum. Multiple dilated loops of air-filled small bowel are again seen throughout the abdomen. Radiopaque surgical clips are seen within the right upper quadrant. No radio-opaque calculi or other significant radiographic abnormality are seen. IMPRESSION: 1. Nasogastric tube positioning, as described above. 2. Findings consistent with a persistent small bowel obstruction. Electronically Signed   By: Suzen Dials M.D.   On: 05/01/2024 18:04   DG Abd Portable 1V-Small Bowel Protocol-Position Verification Result Date: 05/01/2024 EXAM: 1 VIEW XRAY OF THE ABDOMEN 05/01/2024 04:31:00 PM COMPARISON: 05/01/2024 CLINICAL HISTORY: Small bowel obstruction (SBO). FINDINGS: LINES, TUBES AND DEVICES: Nasogastric tube extends to the distal esophagus. BOWEL: Stomach appears decompressed.  Several gas dilated mid abdominal small bowel loops, slightly decreased in number and degree of dilatation. The colon appears decompressed. SOFT TISSUES: Cholecystectomy clips. No opaque urinary calculi. BONES: No acute osseous abnormality. IMPRESSION: 1. Small bowel obstruction, slightly improved compared to 05/01/2024. 2. Gastric tube  extends only to distal esophagus. Electronically signed by: Katheleen Faes MD 05/01/2024 04:38 PM EDT RP Workstation: [id number]   DG Abd 1 View Result Date: 05/01/2024 EXAM: 1 VIEW XRAY OF THE ABDOMEN 05/01/2024 05:42:00 AM COMPARISON: 04/30/2024 CLINICAL HISTORY: Ileus (HCC) 01250. ileus. FINDINGS: LINES, TUBES AND DEVICES: Enteric tube terminates in the stomach. Left renal artery stent noted. BOWEL: Persistent and progressive dilated small bowel loops in the left mid and right lower quadrant abdomen. SOFT TISSUES: Surgical clips in the right upper quadrant of the abdomen. No opaque urinary calculi. BONES: No acute osseous abnormality. IMPRESSION: 1. Persistent and progressive small bowel dilatation compatible with either mid to distal small bowel obstruction versus ileus. 2. Enteric tube terminates in the stomach. 3. Postsurgical changes with right upper quadrant surgical clips and left renal artery stent. Electronically signed by: Waddell Calk MD 05/01/2024 08:34 AM EDT RP Workstation: [id number]    Anti-infectives: Anti-infectives (From admission, onward)    None       Assessment/Plan: SBO vs. Ileus No x-ray evidence of SBO, so this is likely an ileus.  Would clamp NG tube and start clear liquids.  If she becomes more distended or nauseated, replace NG suction.   Encourage ambulation Consider Reglan  if no contraindications No indication for surgical intervention.   LOS: 3 days    Brooke [name] 05/02/2024

## 2024-05-02 NOTE — Progress Notes (Signed)
  [facility] Progress Note   Assessment/ Plan:    Hypernatremia:  sig FW deficit.  Already on hypotonic fluids but likely needs more isotonic fluid bolus administration too             - 1s/p 2.5L LR boluses yesterday             - D5W @ 150 mL/ hr- continue             - PM labs pending  - another 1 L LR bolus now   - strict I/ O reordered   2.  Hypokalemia:             - received IV and PO replacement             - follow             - will also add on magnesium - WNL  - Phos < 1.0, getting K phos today   3.  AKI on CKD 3             - hypovolemia, mild, expect to improve with fluid resuscitation             - renal vascular duplex negative (has known L stent)   4.  SBO/ Ileus             - in the setting of Mounjaro              - would not retrial GLP-1 or GIP/GLP-1             - GI amd gen surg following, has NGT   5.  Dispo: admitted  Subjective:    Seen and examined.  NGT to LIWS.  NGT output and urine not measured.  Aggressively resuscitated yesterday, some slightly improvement in Na.  K 3.4, Phos < 1.0, getting K phos today.    Objective:   BP (!) 150/84   Pulse (!) 121   Temp 98.7 F (37.1 C) (Oral)   Resp (!) 21   Ht 5' 3 (1.6 m)   Wt 81 kg   SpO2 96%   BMI 31.63 kg/m   Intake/Output Summary (Last 24 hours) at 05/02/2024 1307 Last data filed at 05/02/2024 1243 Gross per 24 hour  Intake 5054.77 ml  Output --  Net 5054.77 ml   Weight change:   Physical Exam: GEN appears uncomfortable, a little better than yesterday HEENT EOMI PERRL NECK flat neck veins PULM clear CV tachy ABD distention is better EXT no LE edema SKIN: slight skin tenting, marginally improved NEURO: Aao x 3, slightly tremulous  Imaging: DG Abd Portable 1V-Small Bowel Obstruction Protocol-initial, 8 hr delay Result Date: 05/02/2024 EXAM: 1 VIEW XRAY OF THE ABDOMEN 05/02/2024 05:30:00 AM COMPARISON: 05/01/2024 CLINICAL HISTORY: 8 hour SBO protocol image FINDINGS:  LINES, TUBES AND DEVICES: Enteric tube stable in place. BOWEL: Mildly regressed small bowel distension. Enteric contrast material is identified within the distal small bowel, colon, and rectum. SOFT TISSUES: Stable cholecystectomy clips. Left renal artery region vascular stent again noted. No opaque urinary calculi. BONES: No acute osseous abnormality. IMPRESSION: 1. Mildly regressed small bowel distension. 2. Enteric contrast present within the distal small bowel, colon, and rectum. Electronically signed by: Waddell Calk MD 05/02/2024 06:12 AM EDT RP Workstation: [id number]   VAS US  RENAL ARTERY DUPLEX Result Date: 05/01/2024 ABDOMINAL VISCERAL Patient Name:  Brooke [name] [facility]  Date of Exam:   05/01/2024 Medical Rec #: [id number]  Accession #:    [id number] Date of Birth: 12/27/1950       Patient Gender: F Patient Age:   73 years Exam Location:  [facility] Procedure:      VAS US  RENAL ARTERY DUPLEX Referring Phys: [id number] JENNIFER YATES -------------------------------------------------------------------------------- Indications: Acute kidney injury [302455] High Risk Factors: Hypertension, hyperlipidemia, Diabetes. Limitations: Air/bowel gas, obesity, patient discomfort and patient movement, patient respiratory disturbance. Comparison Study: No prior studies. Performing Technologist: Cordella Collet RVT  Examination Guidelines: A complete evaluation includes B-mode imaging, spectral Doppler, color Doppler, and power Doppler as needed of all accessible portions of each vessel. Bilateral testing is considered an integral part of a complete examination. Limited examinations for reoccurring indications may be performed as noted.  Duplex Findings: +--------------------+--------+--------+------+--------------+ Mesenteric          PSV cm/sEDV cm/sPlaque   Comments    +--------------------+--------+--------+------+--------------+ Aorta Mid             101      19                         +--------------------+--------+--------+------+--------------+ Celiac Artery Origin                      Not visualized +--------------------+--------+--------+------+--------------+ SMA Origin                                Not visualized +--------------------+--------+--------+------+--------------+    +------------------+--------+--------+-------+ Right Renal ArteryPSV cm/sEDV cm/sComment +------------------+--------+--------+-------+ Origin               52      22           +------------------+--------+--------+-------+ Proximal             85      32           +------------------+--------+--------+-------+ Mid                  82      25           +------------------+--------+--------+-------+ Distal               93      23           +------------------+--------+--------+-------+ +-----------------+--------+--------+-------+ Left Renal ArteryPSV cm/sEDV cm/sComment +-----------------+--------+--------+-------+ Origin              52      14           +-----------------+--------+--------+-------+ Proximal            59      18           +-----------------+--------+--------+-------+ Mid                103      26           +-----------------+--------+--------+-------+ Distal              64      17           +-----------------+--------+--------+-------+ +------------+--------+--------+----+-----------+--------+--------+----+ Right KidneyPSV cm/sEDV cm/sRI  Left KidneyPSV cm/sEDV cm/sRI   +------------+--------+--------+----+-----------+--------+--------+----+ Upper Pole  41      13      0.68Upper Pole 52      10      0.80 +------------+--------+--------+----+-----------+--------+--------+----+ Mid         33      9  0.74Mid        51      13      0.74 +------------+--------+--------+----+-----------+--------+--------+----+ Lower Pole  42      12      0.70Lower Pole 53      14      0.73  +------------+--------+--------+----+-----------+--------+--------+----+ Hilar       93      19      0.79Hilar      70      19      0.73 +------------+--------+--------+----+-----------+--------+--------+----+ +------------------+----+------------------+----+ Right Kidney          Left Kidney            +------------------+----+------------------+----+ RAR                   RAR                    +------------------+----+------------------+----+ RAR (manual)      0.92RAR (manual)      1.02 +------------------+----+------------------+----+ Cortex                Cortex                 +------------------+----+------------------+----+ Cortex thickness      Corex thickness        +------------------+----+------------------+----+ Kidney length (cm)9.50Kidney length (cm)9.00 +------------------+----+------------------+----+  Summary: Renal:  Right: Normal size right kidney. Normal right Resisitive Index. No        evidence of right renal artery stenosis. Left:  Normal size of left kidney. Normal left Resistive Index. No        evidence of left renal artery stenosis.  *See table(s) above for measurements and observations.  Diagnosing physician: Gaile New MD  Electronically signed by Gaile New MD on 05/01/2024 at 9:57:11 PM.    Final    DG Abd 1 View Result Date: 05/01/2024 CLINICAL DATA:  NG tube advanced. EXAM: ABDOMEN - 1 VIEW COMPARISON:  May 01, 2024 (4:27 p.m.) FINDINGS: A nasogastric tube is seen with its distal tip overlying the expected region of the proximal duodenum. Multiple dilated loops of air-filled small bowel are again seen throughout the abdomen. Radiopaque surgical clips are seen within the right upper quadrant. No radio-opaque calculi or other significant radiographic abnormality are seen. IMPRESSION: 1. Nasogastric tube positioning, as described above. 2. Findings consistent with a persistent small bowel obstruction. Electronically Signed   By:  Suzen Dials M.D.   On: 05/01/2024 18:04   DG Abd Portable 1V-Small Bowel Protocol-Position Verification Result Date: 05/01/2024 EXAM: 1 VIEW XRAY OF THE ABDOMEN 05/01/2024 04:31:00 PM COMPARISON: 05/01/2024 CLINICAL HISTORY: Small bowel obstruction (SBO). FINDINGS: LINES, TUBES AND DEVICES: Nasogastric tube extends to the distal esophagus. BOWEL: Stomach appears decompressed. Several gas dilated mid abdominal small bowel loops, slightly decreased in number and degree of dilatation. The colon appears decompressed. SOFT TISSUES: Cholecystectomy clips. No opaque urinary calculi. BONES: No acute osseous abnormality. IMPRESSION: 1. Small bowel obstruction, slightly improved compared to 05/01/2024. 2. Gastric tube  extends only to distal esophagus. Electronically signed by: Katheleen Faes MD 05/01/2024 04:38 PM EDT RP Workstation: [id number]   DG Abd 1 View Result Date: 05/01/2024 EXAM: 1 VIEW XRAY OF THE ABDOMEN 05/01/2024 05:42:00 AM COMPARISON: 04/30/2024 CLINICAL HISTORY: Ileus (HCC) 01250. ileus. FINDINGS: LINES, TUBES AND DEVICES: Enteric tube terminates in the stomach. Left renal artery stent noted. BOWEL: Persistent and progressive dilated small bowel loops in the left mid and right lower quadrant abdomen. SOFT TISSUES: Surgical clips  in the right upper quadrant of the abdomen. No opaque urinary calculi. BONES: No acute osseous abnormality. IMPRESSION: 1. Persistent and progressive small bowel dilatation compatible with either mid to distal small bowel obstruction versus ileus. 2. Enteric tube terminates in the stomach. 3. Postsurgical changes with right upper quadrant surgical clips and left renal artery stent. Electronically signed by: Waddell Calk MD 05/01/2024 08:34 AM EDT RP Workstation: [id number]    Labs: BMET Recent Labs  Lab 04/29/24 1047 04/30/24 0431 04/30/24 1200 05/01/24 0416 05/01/24 1341 05/01/24 1830 05/02/24 0340  NA 147* 152* 155* 156* 158* 158* 156*  155*  K 3.5  3.0* 3.1* 3.0* 3.2* 3.3* 3.4*  3.4*  CL 116* 121* 124* 126* 127* 129* 126*  127*  CO2 16* 15* 17* 17* 18* 19* 20*  20*  GLUCOSE 96 104* 106* 106* 119* 91 111*  111*  BUN 24* 26* 27* 29* 28* 25* 22  22  CREATININE 1.49* 1.65* 1.76* 1.98* 2.11* 1.91* 1.94*  1.91*  CALCIUM  8.7* 8.9 9.0 9.0 9.3 9.0 9.0  9.0  PHOS  --   --   --   --   --   --  <1.0*   CBC Recent Labs  Lab 04/29/24 1047 04/30/24 0431 05/01/24 0416 05/02/24 0340  WBC 6.3 4.8 6.4 9.0  NEUTROABS  --  3.5 3.9 5.8  HGB 10.3* 10.7* 10.5* 10.3*  HCT 33.2* 34.0* 34.0* 33.5*  MCV 86.5 86.1 85.9 86.8  PLT 258 258 236 226    Medications:     insulin  aspart  0-9 Units Subcutaneous TID WC   ondansetron  (ZOFRAN ) IV  4 mg Intravenous Once   sodium bicarbonate   650 mg Per Tube BID    Almarie Bonine MD 05/02/2024, 1:07 PM

## 2024-05-03 DIAGNOSIS — K56609 Unspecified intestinal obstruction, unspecified as to partial versus complete obstruction: Secondary | ICD-10-CM | POA: Diagnosis not present

## 2024-05-03 DIAGNOSIS — R109 Unspecified abdominal pain: Secondary | ICD-10-CM | POA: Diagnosis not present

## 2024-05-03 DIAGNOSIS — K567 Ileus, unspecified: Secondary | ICD-10-CM | POA: Diagnosis not present

## 2024-05-03 DIAGNOSIS — R935 Abnormal findings on diagnostic imaging of other abdominal regions, including retroperitoneum: Secondary | ICD-10-CM | POA: Diagnosis not present

## 2024-05-03 LAB — CBC WITH DIFFERENTIAL/PLATELET
Abs Immature Granulocytes: 0.39 K/uL — ABNORMAL HIGH (ref 0.00–0.07)
Basophils Absolute: 0 K/uL (ref 0.0–0.1)
Basophils Relative: 0 %
Eosinophils Absolute: 0 K/uL (ref 0.0–0.5)
Eosinophils Relative: 0 %
HCT: 30.3 % — ABNORMAL LOW (ref 36.0–46.0)
Hemoglobin: 9.3 g/dL — ABNORMAL LOW (ref 12.0–15.0)
Immature Granulocytes: 4 %
Lymphocytes Relative: 19 %
Lymphs Abs: 1.9 K/uL (ref 0.7–4.0)
MCH: 26.7 pg (ref 26.0–34.0)
MCHC: 30.7 g/dL (ref 30.0–36.0)
MCV: 87.1 fL (ref 80.0–100.0)
Monocytes Absolute: 1.5 K/uL — ABNORMAL HIGH (ref 0.1–1.0)
Monocytes Relative: 15 %
Neutro Abs: 6 K/uL (ref 1.7–7.7)
Neutrophils Relative %: 62 %
Platelets: 195 K/uL (ref 150–400)
RBC: 3.48 MIL/uL — ABNORMAL LOW (ref 3.87–5.11)
RDW: 19.9 % — ABNORMAL HIGH (ref 11.5–15.5)
WBC: 9.8 K/uL (ref 4.0–10.5)
nRBC: 0.3 % — ABNORMAL HIGH (ref 0.0–0.2)

## 2024-05-03 LAB — BASIC METABOLIC PANEL WITH GFR
Anion gap: 9 (ref 5–15)
BUN: 15 mg/dL (ref 8–23)
CO2: 20 mmol/L — ABNORMAL LOW (ref 22–32)
Calcium: 8.1 mg/dL — ABNORMAL LOW (ref 8.9–10.3)
Chloride: 121 mmol/L — ABNORMAL HIGH (ref 98–111)
Creatinine, Ser: 1.87 mg/dL — ABNORMAL HIGH (ref 0.44–1.00)
GFR, Estimated: 28 mL/min — ABNORMAL LOW (ref >60)
Glucose, Bld: 112 mg/dL — ABNORMAL HIGH (ref 70–99)
Potassium: 3.2 mmol/L — ABNORMAL LOW (ref 3.5–5.1)
Sodium: 149 mmol/L — ABNORMAL HIGH (ref 135–145)

## 2024-05-03 LAB — GLUCOSE, CAPILLARY
Glucose-Capillary: 123 mg/dL — ABNORMAL HIGH (ref 70–99)
Glucose-Capillary: 109 mg/dL — ABNORMAL HIGH (ref 70–99)
Glucose-Capillary: 107 mg/dL — ABNORMAL HIGH (ref 70–99)
Glucose-Capillary: 133 mg/dL — ABNORMAL HIGH (ref 70–99)

## 2024-05-03 LAB — PHOSPHORUS: Phosphorus: 1.8 mg/dL — ABNORMAL LOW (ref 2.5–4.6)

## 2024-05-03 MED ORDER — METOPROLOL TARTRATE 50 MG PO TABS
50.0000 mg | ORAL_TABLET | Freq: Two times a day (BID) | ORAL | Status: DC
  Administered 2024-05-03 – 2024-05-05 (×5): 50 mg via ORAL
  Filled 2024-05-03 (×5): qty 1

## 2024-05-03 MED ORDER — DEXTROSE 5 % IV SOLN: INTRAVENOUS | Status: DC

## 2024-05-03 MED ORDER — POTASSIUM PHOSPHATES 15 MMOLE/5ML IV SOLN
30.0000 mmol | Freq: Four times a day (QID) | INTRAVENOUS | Status: AC
  Administered 2024-05-03 (×2): 30 mmol via INTRAVENOUS
  Filled 2024-05-03: qty 5
  Filled 2024-05-03: qty 10

## 2024-05-03 NOTE — Progress Notes (Signed)
 Subjective/Chief Complaint: At least two BM yesterday No nausea or vomiting Tolerated clears   Objective: Vital signs in last 24 hours: Temp:  [98.8 F (37.1 C)-99.1 F (37.3 C)] 99.1 F (37.3 C) (10/12 0337) Pulse Rate:  [121-139] 139 (10/12 0337) Resp:  [19-20] 19 (10/12 0358) BP: (127-172)/(84-93) 162/93 (10/12 0337) SpO2:  [94 %-100 %] 98 % (10/12 0337) Last BM Date : 05/02/24  Intake/Output from previous day: 10/11 0701 - 10/12 0700 In: 5091.5 [I.V.:2227.1; NG/GT:155; IV Piggyback:2709.4] Out: 750 [Emesis/NG output:750] Intake/Output this shift: No intake/output data recorded.  Abd - soft, non-tender, non-distended  Lab Results:  Recent Labs    05/02/24 0340 05/03/24 0420  WBC 9.0 9.8  HGB 10.3* 9.3*  HCT 33.5* 30.3*  PLT 226 195   BMET Recent Labs    05/02/24 1544 05/03/24 0420  NA 151* 149*  K 3.6 3.2*  CL 122* 121*  CO2 19* 20*  GLUCOSE 139* 112*  BUN 18 15  CREATININE 1.84* 1.87*  CALCIUM  8.6* 8.1*   PT/INR No results for input(s): LABPROT, INR in the last 72 hours. ABG No results for input(s): PHART, HCO3 in the last 72 hours.  Invalid input(s): PCO2, PO2  Studies/Results: DG Abd Portable 1V-Small Bowel Obstruction Protocol-initial, 8 hr delay Result Date: 05/02/2024 EXAM: 1 VIEW XRAY OF THE ABDOMEN 05/02/2024 05:30:00 AM COMPARISON: 05/01/2024 CLINICAL HISTORY: 8 hour SBO protocol image FINDINGS: LINES, TUBES AND DEVICES: Enteric tube stable in place. BOWEL: Mildly regressed small bowel distension. Enteric contrast material is identified within the distal small bowel, colon, and rectum. SOFT TISSUES: Stable cholecystectomy clips. Left renal artery region vascular stent again noted. No opaque urinary calculi. BONES: No acute osseous abnormality. IMPRESSION: 1. Mildly regressed small bowel distension. 2. Enteric contrast present within the distal small bowel, colon, and rectum. Electronically signed by: Waddell Calk MD  05/02/2024 06:12 AM EDT RP Workstation: [id number]   VAS US  RENAL ARTERY DUPLEX Result Date: 05/01/2024 ABDOMINAL VISCERAL Patient Name:  Brooke [name] [facility]  Date of Exam:   05/01/2024 Medical Rec #: [id number]       Accession #:    [id number] Date of Birth: Nov 08, 1950       Patient Gender: F Patient Age:   73 years Exam Location:  [facility] Procedure:      VAS US  RENAL ARTERY DUPLEX Referring Phys: [id number] JENNIFER YATES -------------------------------------------------------------------------------- Indications: Acute kidney injury [302455] High Risk Factors: Hypertension, hyperlipidemia, Diabetes. Limitations: Air/bowel gas, obesity, patient discomfort and patient movement, patient respiratory disturbance. Comparison Study: No prior studies. Performing Technologist: Cordella Collet RVT  Examination Guidelines: A complete evaluation includes B-mode imaging, spectral Doppler, color Doppler, and power Doppler as needed of all accessible portions of each vessel. Bilateral testing is considered an integral part of a complete examination. Limited examinations for reoccurring indications may be performed as noted.  Duplex Findings: +--------------------+--------+--------+------+--------------+ Mesenteric          PSV cm/sEDV cm/sPlaque   Comments    +--------------------+--------+--------+------+--------------+ Aorta Mid             101      19                        +--------------------+--------+--------+------+--------------+ Celiac Artery Origin                      Not visualized +--------------------+--------+--------+------+--------------+ SMA Origin  Not visualized +--------------------+--------+--------+------+--------------+    +------------------+--------+--------+-------+ Right Renal ArteryPSV cm/sEDV cm/sComment +------------------+--------+--------+-------+ Origin               52      22            +------------------+--------+--------+-------+ Proximal             85      32           +------------------+--------+--------+-------+ Mid                  82      25           +------------------+--------+--------+-------+ Distal               93      23           +------------------+--------+--------+-------+ +-----------------+--------+--------+-------+ Left Renal ArteryPSV cm/sEDV cm/sComment +-----------------+--------+--------+-------+ Origin              52      14           +-----------------+--------+--------+-------+ Proximal            59      18           +-----------------+--------+--------+-------+ Mid                103      26           +-----------------+--------+--------+-------+ Distal              64      17           +-----------------+--------+--------+-------+ +------------+--------+--------+----+-----------+--------+--------+----+ Right KidneyPSV cm/sEDV cm/sRI  Left KidneyPSV cm/sEDV cm/sRI   +------------+--------+--------+----+-----------+--------+--------+----+ Upper Pole  41      13      0.68Upper Pole 52      10      0.80 +------------+--------+--------+----+-----------+--------+--------+----+ Mid         33      9       0.        51      13      0.74 +------------+--------+--------+----+-----------+--------+--------+----+ Lower Pole  42      12      0.70Lower Pole 53      14      0.73 +------------+--------+--------+----+-----------+--------+--------+----+ Hilar       93      19      0.79Hilar      70      19      0.73 +------------+--------+--------+----+-----------+--------+--------+----+ +------------------+----+------------------+----+ Right Kidney          Left Kidney            +------------------+----+------------------+----+ RAR                   RAR                    +------------------+----+------------------+----+ RAR (manual)      0.92RAR (manual)      1.02  +------------------+----+------------------+----+ Cortex                Cortex                 +------------------+----+------------------+----+ Cortex thickness      Corex thickness        +------------------+----+------------------+----+ Kidney length (cm)9.50Kidney length (cm)9.00 +------------------+----+------------------+----+  Summary: Renal:  Right: Normal size right kidney. Normal right Resisitive Index. No  evidence of right renal artery stenosis. Left:  Normal size of left kidney. Normal left Resistive Index. No        evidence of left renal artery stenosis.  *See table(s) above for measurements and observations.  Diagnosing physician: Gaile New MD  Electronically signed by Gaile New MD on 05/01/2024 at 9:57:11 PM.    Final    DG Abd 1 View Result Date: 05/01/2024 CLINICAL DATA:  NG tube advanced. EXAM: ABDOMEN - 1 VIEW COMPARISON:  May 01, 2024 (4:27 p.m.) FINDINGS: A nasogastric tube is seen with its distal tip overlying the expected region of the proximal duodenum. Multiple dilated loops of air-filled small bowel are again seen throughout the abdomen. Radiopaque surgical clips are seen within the right upper quadrant. No radio-opaque calculi or other significant radiographic abnormality are seen. IMPRESSION: 1. Nasogastric tube positioning, as described above. 2. Findings consistent with a persistent small bowel obstruction. Electronically Signed   By: Suzen Dials M.D.   On: 05/01/2024 18:04   DG Abd Portable 1V-Small Bowel Protocol-Position Verification Result Date: 05/01/2024 EXAM: 1 VIEW XRAY OF THE ABDOMEN 05/01/2024 04:31:00 PM COMPARISON: 05/01/2024 CLINICAL HISTORY: Small bowel obstruction (SBO). FINDINGS: LINES, TUBES AND DEVICES: Nasogastric tube extends to the distal esophagus. BOWEL: Stomach appears decompressed. Several gas dilated mid abdominal small bowel loops, slightly decreased in number and degree of dilatation. The colon appears  decompressed. SOFT TISSUES: Cholecystectomy clips. No opaque urinary calculi. BONES: No acute osseous abnormality. IMPRESSION: 1. Small bowel obstruction, slightly improved compared to 05/01/2024. 2. Gastric tube  extends only to distal esophagus. Electronically signed by: Dayne Hassell MD 05/01/2024 04:38 PM EDT RP Workstation: [id number]    Anti-infectives: Anti-infectives (From admission, onward)    None       Assessment/Plan: Ileus - resolved  D/C NG tube Full liquids, advance diet as tolerated Will sign off for now.  Please call us  back if needed.  LOS: 4 days    Donnice MARLA Lima 05/03/2024

## 2024-05-03 NOTE — Progress Notes (Signed)
 HISTORY OF PRESENT ILLNESS:  Brooke [name] is a 73 y.o. female was admitted to the hospital with symptomatic ileus.  Doing well.  Moving bowels.  Passing flatus.  Tolerating diet.  NG tube out.  Sitting in chair comfortably and happy.  Sodium 149.  Potassium 3.2  REVIEW OF SYSTEMS:  All non-GI ROS negative except for  Past Medical History:  Diagnosis Date   Alopecia    Anxiety    Aortic atherosclerosis    Asthma    FOLLOWED BY PCP   Eczema    GERD (gastroesophageal reflux disease)    Gout    04-28-2018--- per pt stable , as been a while since last episode   Heart murmur    History of colon polyps    History of syncope 2015   Hyperlipidemia    Hypertension    Hypothyroidism    OA (osteoarthritis) of knee    bilateral   PVC's (premature ventricular contractions)    Toxic multinodular goiter    03/ 2003  s/p RAI   Type 2 diabetes mellitus (HCC)    followed by pcp   Ventricular tachycardia, polymorphic (HCC) 01/04/2014   primary cardiologist-- dr wilbert turner (hx monitor 2015 showed couplet PVCs, as trigger)   Wears glasses    Wears partial dentures    upper    Past Surgical History:  Procedure Laterality Date   ABDOMINAL HYSTERECTOMY  10/22/1999   WITH BSO   CATARACT EXTRACTION W/ INTRAOCULAR LENS IMPLANT Left YRS AGO   COLONOSCOPY     EXCISION ABDOMINAL WALL MASS  12-20-2005   dr merrilyn @[facility]    neruofibroma   LAPAROSCOPIC CHOLECYSTECTOMY  10/01/2002   dr merrilyn @[facility]    LEFT HEART CATHETERIZATION WITH CORONARY ANGIOGRAM N/A 01/07/2014   Procedure: LEFT HEART CATHETERIZATION WITH CORONARY ANGIOGRAM;  Surgeon: Peter M Swaziland, MD;  Location: [facility] CATH LAB;  Service: Cardiovascular;  Laterality: N/A;   RASTELLI PROCEDURE  6/98 neg   RENAL ARTERY STENT Left 11/2003   angioplasty and stenting   RENAL ARTERY STENT Left 02/2005   re-stenting   TOTAL KNEE ARTHROPLASTY Right 05/05/2018   Procedure: RIGHT TOTAL KNEE ARTHROPLASTY;  Surgeon: Liam Lerner, MD;  Location: [facility];   Service: Orthopedics;  Laterality: Right;   TOTAL KNEE ARTHROPLASTY Left 09/30/2018   Procedure: TOTAL KNEE ARTHROPLASTY;  Surgeon: Sheril Coy, MD;  Location: [facility];  Service: Orthopedics;  Laterality: Left;    Social History Danaye Sobh Barnick  reports that she has never smoked. She has never been exposed to tobacco smoke. She has never used smokeless tobacco. She reports that she does not drink alcohol and does not use drugs.  family history includes Diabetes in her mother; Heart disease in her father.  Allergies  Allergen Reactions   Metformin  And Related Nausea And Vomiting   Ace Inhibitors Swelling and Other (See Comments)    Ankles swell   Codeine Nausea And Vomiting and Rash   Hydrocodone  Nausea And Vomiting   Tramadol  Nausea And Vomiting   Tylenol  [Acetaminophen ] Itching, Rash and Other (See Comments)    Allergic to prescription strength Tylenol . The patient is taking 500 mg Tylenol  in 2025, however.       PHYSICAL EXAMINATION: Vital signs: BP (!) 162/93   Pulse (!) 139   Temp 99.1 F (37.3 C) (Oral)   Resp 19   Ht 5' 3 (1.6 m)   Wt 81 kg   SpO2 98%   BMI 31.63 kg/m  General: Well-developed, well-nourished, no  acute distress HEENT: Sclerae are anicteric, conjunctiva pink. Oral mucosa intact Lungs: Clear Heart: Regular Abdomen: soft, nontender, nondistended, no obvious ascites, no peritoneal signs, normal bowel sounds. No organomegaly. Extremities: No edema Psychiatric: alert and oriented x3. Cooperative     ASSESSMENT:  1.  Ileus.  Resolved 2.  Multiple medical problems  PLAN:  1.  Advance diet 2.  Continue to increase activity 3.  Ongoing general medical care with primary service. 4.  No further plans from GI perspective.  Problem has resolved.  Will sign off.  Norleen SAILOR. Abran Raddle., M.D. [facility] Division of Gastroenterology

## 2024-05-03 NOTE — Progress Notes (Signed)
 Progress Note   Patient: Brooke [name] FMW:[id number] DOB: [birth year] DOA: 04/27/2024     4 DOS: the patient was seen and examined on 05/03/2024   Brief hospital course: 73yo with h/o DM, HTN, stage 3 CKD, and COPD with recent admission for n/v associated with Mounjaro  (continued use) and COVID who presented on 10/7 with abdominal pain.  CT with ileus.  Has been very slow to respond but Mounjaro  is likely mostly out of her system now and ileus has resolved.  Surgery and GI have signed off.  Marked AKI with hypernatremia, nephrology helpful in correcting.  Assessment and Plan:  Ileus  vs. SBO Multiple days of abdominal pain, decreased po intake following recent admission for colitis in setting of Mounjaro  use  CT imaging concerning for ileus on presentation Noted recurrent Mounjaro  use on 10/6 after prior dc with rapid worsening of abd pain and nausea  NPO/IVF with pain control and antiemetics  STOP Mounjaro  GI consulting NG tube placed to LIWS, attempted to clamp tube today but she appears to be worsening Xray with persistent and progressive SB dilatation - SBO vs. Ileus Surgery consulted She is now having multiple BMs, appears to be resolving NG tube is out, patient is tolerating diet Surgery and GI have now both signed off   Hypernatremia Suspect that this is hypovolemic with output outpacing input Given boluses without improvement Increased IVF rate overnight without improvement Changed to D5W, more boluses Nephrology consulting More boluses Continuing to closely monitor Transferred to progressive care Now finally improving   AKI on CKD stage IIIb with chronic metabolic acidosis Has been worsening with hypernatremia, but slightly improved this AM Attempt to avoid nephrotoxic medications Recheck BMP in PM and again AM  Placed on sodium bicarb tabs 650 mg twice daily for 1 week during last hospitalization and now resumed with improvement from 16 -> 20 H/o L renal artery  stent, stable renal US  with Duplex Nephrology consulted This is also slowly improving   Chest pain Nonspecific chest pain over multiple days Baseline history of cardiac evaluation including Cath 2015 showed normal coronary arteries Repeat nuclear stress test 12/2016 for CP showed small moderate intensity anteroapical defect that was fixed and felt consistent with apical thinning, low risk study, EF 62%.  EKG w/ nonspecific changes  Trop negative x 2  Echo with preserved EF, grade 1 DD, no WMA Concern for LVOT obstruction on echo Consulted cardiology - will start IV metoprolol  5mg  q2h prn with plan to transition to metoprolol  50 mg po BID when taking PO Rhythm appears to have stabilized Cardiology is signed off   Hypokalemia/hypophosphatemia Replete K+ PO and IV, goal is >4 but still normal Replete with KPhos as needed   Hypertension Continue metoprolol , changed to IV and will resume PO at this time Add prn IV hydralazine    Possible pancreatic mass CT renal study in 01/2023 with 8mm hyperdense region in pancreatic tail Recommended for MRI in 07/2024 Consider imaging sooner if ongoing concerns   COPD Stable, no active wheezing Continue Trelegy   Diabetes mellitus type 2, NIDDM Last A1c 6.2 on 8/6, good control Hold Jardiance  STOP Mounjaro  Start sensitive-scale sliding scale insulin    Depression Continue Celexa , trazodone    Chronic anemia, normocytic Baseline hemoglobin 9-10 Currently at baseline   Hyperlipidemia Continue statin   History of glaucoma Continue outpatient eyedrops   Class 1 obesity Body mass index is 31.53 kg/m.SABRA  Weight loss should be encouraged STOP GLP-1 Outpatient PCP/bariatric medicine f/u encouraged Significantly low  or high BMI is associated with higher medical risk including morbidity and mortality          Consultants: GI Cardiology Nephrology Surgery PT   Procedures: None   Antibiotics: None    30 Day Unplanned  Readmission Risk Score    Flowsheet Row ED to Hosp-Admission (Current) from 04/27/2024 in [place] 4TH FLOOR PROGRESSIVE CARE AND UROLOGY  30 Day Unplanned Readmission Risk Score (%) 26.59 Filed at 05/03/2024 0401    This score is the patient's risk of an unplanned readmission within 30 days of being discharged (0 -100%). The score is based on dignosis, age, lab data, medications, orders, and past utilization.   Low:  0-14.9   Medium: 15-21.9   High: 22-29.9   Extreme: 30 and above           Subjective: Markedly better today.  NG tube is out, she is in bedside chair eating full liquid diet without difficulty.   Objective: Vitals:   05/03/24 0351 05/03/24 0358  BP:    Pulse:    Resp: 19 19  Temp:    SpO2:      Intake/Output Summary (Last 24 hours) at 05/03/2024 1328 Last data filed at 05/03/2024 1000 Gross per 24 hour  Intake 5266.54 ml  Output 1250 ml  Net 4016.54 ml   Filed Weights   04/28/24 0912 05/02/24 0548  Weight: 80.7 kg 81 kg    Exam:  General:  Appears calm and comfortable, NAD, up in bedside chair Eyes:  normal lids, iris ENT:  grossly normal hearing, lips & tongue, mmm; NG tube removed Cardiovascular:  RRR. No LE edema.  Respiratory:   CTA bilaterally with no wheezes/rales/rhonchi.  Normal respiratory effort. Abdomen:  soft, improved diffuse abdominal TTP, improved distention Skin:  no rash or induration seen on limited exam Musculoskeletal:  grossly normal tone BUE/BLE, good ROM, no bony abnormality Psychiatric:  blunted mood and affect, speech fluent and appropriate, AOx3 Neurologic:  CN 2-12 grossly intact, moves all extremities in coordinated fashion  Data Reviewed: I have reviewed the patient's lab results since admission.  Pertinent labs for today include:   Na++ 149, improving K+ 3.2 CO2 20, stable BUN 15/Creatinine 1.87/GFR 28 WBC 9.8 Hgb 9.3     Family Communication: None present  Mobility: PT/OT Consulted and are recommending  - Home Health Pt10/04/2024 1700    Code Status: Full Code  Barriers to discharge:    Disposition: Status is: Inpatient Remains inpatient appropriate because: ongoing monitoring, possibly home in 1-2 days     Time spent: 50 minutes  Unresulted Labs (From admission, onward)     Start     Ordered   05/04/24 0500  CBC with Differential/Platelet  Tomorrow morning,   R       Question:  Specimen collection method  Answer:  Lab=Lab collect   05/03/24 1312   05/04/24 0500  Basic metabolic panel with GFR  Tomorrow morning,   R       Question:  Specimen collection method  Answer:  Lab=Lab collect   05/03/24 1312             Author: Delon Herald, MD 05/03/2024 1:28 PM  For on call review [email or web address].

## 2024-05-03 NOTE — Progress Notes (Signed)
 [facility] Progress Note   Assessment/ Plan:    Hypernatremia:  sig FW deficit.  Already on hypotonic fluids but likely needs more isotonic fluid bolus administration too             -  around 10 L IVFs given over the past 48 hrs             - D5W @ 150 mL/ hr- starting to get wheezy on exam- will stop for now             - PM labs pending  - if needed would restart d5W at 75 mL/ hr   2.  Hypokalemia:             - aggressive repletion with Kcl and Kphos   3.  AKI on CKD 3             - hypovolemia, mild, expect to improve with fluid resuscitation             - renal vascular duplex negative (has known L stent)  - resolved   4.  SBO/ Ileus             - in the setting of Mounjaro              - would not retrial GLP-1 or GIP/GLP-1             - GI amd gen surg following, improving and NGT out  - wrote down for pt meds to never retrial   5.  Dispo: admitted.  We will sign off, call with questions  Subjective:    Seen and examined.  NGT out.  Received nearly 5L IVFs again yesterday, so a total of about 10 L IVFs over the past 48 hrs.     Objective:   BP (!) 142/73 (BP Location: Right Arm)   Pulse 96   Temp 98.9 F (37.2 C) (Oral)   Resp 20   Ht 5' 3 (1.6 m)   Wt 81 kg   SpO2 99%   BMI 31.63 kg/m   Intake/Output Summary (Last 24 hours) at 05/03/2024 1655 Last data filed at 05/03/2024 1000 Gross per 24 hour  Intake 2431.13 ml  Output 1250 ml  Net 1181.13 ml   Weight change:   Physical Exam: GEN NAD, NGT out HEENT EOMI PERRL NECK flat neck veins PULM some rough wheezy expiratory sounds  CV tachy ABD distention is better EXT no LE edema SKIN: no skin tending NEURO: Aao x 3  Imaging: DG Abd Portable 1V-Small Bowel Obstruction Protocol-initial, 8 hr delay Result Date: 05/02/2024 EXAM: 1 VIEW XRAY OF THE ABDOMEN 05/02/2024 05:30:00 AM COMPARISON: 05/01/2024 CLINICAL HISTORY: 8 hour SBO protocol image FINDINGS: LINES, TUBES AND DEVICES: Enteric  tube stable in place. BOWEL: Mildly regressed small bowel distension. Enteric contrast material is identified within the distal small bowel, colon, and rectum. SOFT TISSUES: Stable cholecystectomy clips. Left renal artery region vascular stent again noted. No opaque urinary calculi. BONES: No acute osseous abnormality. IMPRESSION: 1. Mildly regressed small bowel distension. 2. Enteric contrast present within the distal small bowel, colon, and rectum. Electronically signed by: Waddell Calk MD 05/02/2024 06:12 AM EDT RP Workstation: [id number]   DG Abd 1 View Result Date: 05/01/2024 CLINICAL DATA:  NG tube advanced. EXAM: ABDOMEN - 1 VIEW COMPARISON:  May 01, 2024 (4:27 p.m.) FINDINGS: A nasogastric tube is seen with its distal tip overlying the expected region of the proximal duodenum. Multiple dilated loops  of air-filled small bowel are again seen throughout the abdomen. Radiopaque surgical clips are seen within the right upper quadrant. No radio-opaque calculi or other significant radiographic abnormality are seen. IMPRESSION: 1. Nasogastric tube positioning, as described above. 2. Findings consistent with a persistent small bowel obstruction. Electronically Signed   By: Suzen Dials M.D.   On: 05/01/2024 18:04    Labs: BMET Recent Labs  Lab 04/30/24 1200 05/01/24 0416 05/01/24 1341 05/01/24 1830 05/02/24 0340 05/02/24 1544 05/03/24 0420  NA 155* 156* 158* 158* 156*  155* 151* 149*  K 3.1* 3.0* 3.2* 3.3* 3.4*  3.4* 3.6 3.2*  CL 124* 126* 127* 129* 126*  127* 122* 121*  CO2 17* 17* 18* 19* 20*  20* 19* 20*  GLUCOSE 106* 106* 119* 91 111*  111* 139* 112*  BUN 27* 29* 28* 25* 22  22 18 15   CREATININE 1.76* 1.98* 2.11* 1.91* 1.94*  1.91* 1.84* 1.87*  CALCIUM  9.0 9.0 9.3 9.0 9.0  9.0 8.6* 8.1*  PHOS  --   --   --   --  <1.0* 1.8* 1.8*   CBC Recent Labs  Lab 04/30/24 0431 05/01/24 0416 05/02/24 0340 05/03/24 0420  WBC 4.8 6.4 9.0 9.8  NEUTROABS 3.5 3.9 5.8 6.0  HGB  10.7* 10.5* 10.3* 9.3*  HCT 34.0* 34.0* 33.5* 30.3*  MCV 86.1 85.9 86.8 87.1  PLT 258 236 226 195    Medications:     insulin  aspart  0-9 Units Subcutaneous TID WC   metoprolol  tartrate  50 mg Oral BID   ondansetron  (ZOFRAN ) IV  4 mg Intravenous Once   sodium bicarbonate   650 mg Per Tube BID    Almarie Bonine MD 05/03/2024, 4:55 PM

## 2024-05-04 DIAGNOSIS — K567 Ileus, unspecified: Secondary | ICD-10-CM | POA: Diagnosis not present

## 2024-05-04 DIAGNOSIS — R935 Abnormal findings on diagnostic imaging of other abdominal regions, including retroperitoneum: Secondary | ICD-10-CM | POA: Diagnosis not present

## 2024-05-04 DIAGNOSIS — R109 Unspecified abdominal pain: Secondary | ICD-10-CM | POA: Diagnosis not present

## 2024-05-04 DIAGNOSIS — K56609 Unspecified intestinal obstruction, unspecified as to partial versus complete obstruction: Secondary | ICD-10-CM | POA: Diagnosis not present

## 2024-05-04 LAB — BASIC METABOLIC PANEL WITH GFR
Anion gap: 11 (ref 5–15)
BUN: 13 mg/dL (ref 8–23)
CO2: 19 mmol/L — ABNORMAL LOW (ref 22–32)
Calcium: 7.4 mg/dL — ABNORMAL LOW (ref 8.9–10.3)
Chloride: 118 mmol/L — ABNORMAL HIGH (ref 98–111)
Creatinine, Ser: 1.8 mg/dL — ABNORMAL HIGH (ref 0.44–1.00)
GFR, Estimated: 29 mL/min — ABNORMAL LOW (ref >60)
Glucose, Bld: 87 mg/dL (ref 70–99)
Potassium: 3.3 mmol/L — ABNORMAL LOW (ref 3.5–5.1)
Sodium: 148 mmol/L — ABNORMAL HIGH (ref 135–145)

## 2024-05-04 LAB — GLUCOSE, CAPILLARY
Glucose-Capillary: 85 mg/dL (ref 70–99)
Glucose-Capillary: 95 mg/dL (ref 70–99)
Glucose-Capillary: 89 mg/dL (ref 70–99)

## 2024-05-04 LAB — CBC WITH DIFFERENTIAL/PLATELET
Abs Immature Granulocytes: 0.31 K/uL — ABNORMAL HIGH (ref 0.00–0.07)
Basophils Absolute: 0 K/uL (ref 0.0–0.1)
Basophils Relative: 1 %
Eosinophils Absolute: 0 K/uL (ref 0.0–0.5)
Eosinophils Relative: 0 %
HCT: 30.5 % — ABNORMAL LOW (ref 36.0–46.0)
Hemoglobin: 9.5 g/dL — ABNORMAL LOW (ref 12.0–15.0)
Immature Granulocytes: 4 %
Lymphocytes Relative: 21 %
Lymphs Abs: 1.8 K/uL (ref 0.7–4.0)
MCH: 26.8 pg (ref 26.0–34.0)
MCHC: 31.1 g/dL (ref 30.0–36.0)
MCV: 85.9 fL (ref 80.0–100.0)
Monocytes Absolute: 1 K/uL (ref 0.1–1.0)
Monocytes Relative: 11 %
Neutro Abs: 5.6 K/uL (ref 1.7–7.7)
Neutrophils Relative %: 63 %
Platelets: 210 K/uL (ref 150–400)
RBC: 3.55 MIL/uL — ABNORMAL LOW (ref 3.87–5.11)
RDW: 19.7 % — ABNORMAL HIGH (ref 11.5–15.5)
WBC: 8.8 K/uL (ref 4.0–10.5)
nRBC: 0 % (ref 0.0–0.2)

## 2024-05-04 MED ORDER — POTASSIUM CHLORIDE CRYS ER 20 MEQ PO TBCR
40.0000 meq | EXTENDED_RELEASE_TABLET | Freq: Once | ORAL | Status: DC
  Filled 2024-05-04: qty 2

## 2024-05-04 MED ORDER — POTASSIUM CHLORIDE 20 MEQ PO PACK
40.0000 meq | PACK | Freq: Once | ORAL | Status: AC
Start: 2024-05-04 — End: 2024-05-04
  Administered 2024-05-04: 40 meq via ORAL
  Filled 2024-05-04: qty 2

## 2024-05-04 NOTE — Progress Notes (Signed)
 Progress Note   Patient: Brooke [name] FMW:[id number] DOB: 05/05/1951 DOA: 04/27/2024     5 DOS: the patient was seen and examined on 05/04/2024   Brief hospital course: 73yo with h/o DM, HTN, stage 3 CKD, and COPD with recent admission for n/v associated with Mounjaro  (continued use) and COVID who presented on 10/7 with abdominal pain.  CT with ileus.  Has been very slow to respond but Mounjaro  is likely mostly out of her system now and ileus has resolved.  Surgery and GI have signed off.  Marked AKI with hypernatremia, nephrology helpful in correcting and almost back to baseline.  Assessment and Plan:   Ileus  vs. SBO Multiple days of abdominal pain, decreased po intake following recent admission for colitis in setting of Mounjaro  use  CT imaging concerning for ileus on presentation Noted recurrent Mounjaro  use on 10/6 after prior dc with rapid worsening of abd pain and nausea  NPO/IVF with pain control and antiemetics  STOP Mounjaro  GI consulting NG tube placed to LIWS, attempted to clamp tube today but she appears to be worsening Xray with persistent and progressive SB dilatation - SBO vs. Ileus Surgery consulted She is now having multiple BMs, appears to be resolving NG tube is out, patient is tolerating diet Surgery and GI have now both signed off Anticipate dc tomorrow if ongoing improvement   Hypernatremia Suspect that this is hypovolemic with output outpacing input Given boluses without improvement Increased IVF rate overnight without improvement Changed to D5W, more boluses Nephrology consulting More boluses Now finally improving   AKI on CKD stage IIIb with chronic metabolic acidosis Has been worsening with hypernatremia, but slightly improved this AM Attempt to avoid nephrotoxic medications Recheck BMP in PM and again AM  Placed on sodium bicarb tabs 650 mg twice daily for 1 week during last hospitalization and now resumed with improvement from 16 -> 20 H/o L  renal artery stent, stable renal US  with Duplex Nephrology consulted This is also slowly improving   Chest pain Nonspecific chest pain over multiple days Baseline history of cardiac evaluation including Cath 2015 showed normal coronary arteries Repeat nuclear stress test 12/2016 for CP showed small moderate intensity anteroapical defect that was fixed and felt consistent with apical thinning, low risk study, EF 62%.  EKG w/ nonspecific changes  Trop negative x 2  Echo with preserved EF, grade 1 DD, no WMA Concern for LVOT obstruction on echo Consulted cardiology - started metoprolol  IV -> 50 mg po BID  Rhythm appears to have stabilized Cardiology is signed off   Hypokalemia/hypophosphatemia Replete K+ PO and IV, goal is >4 but still normal Replete with KPhos as needed   Hypertension Continue metoprolol , changed to IV and will resume PO at this time Add prn IV hydralazine    Possible pancreatic mass CT renal study in 01/2023 with 8mm hyperdense region in pancreatic tail Recommended for MRI in 07/2024 Consider imaging sooner if ongoing concerns   COPD Stable, no active wheezing Continue Trelegy   Diabetes mellitus type 2, NIDDM Last A1c 6.2 on 8/6, good control Hold Jardiance  STOP Mounjaro  Start sensitive-scale sliding scale insulin    Depression Continue Celexa , trazodone    Chronic anemia, normocytic Baseline hemoglobin 9-10 Currently at baseline   Hyperlipidemia Continue statin   History of glaucoma Continue outpatient eyedrops   Class 1 obesity Body mass index is 31.53 kg/m.SABRA  Weight loss should be encouraged STOP GLP-1 Outpatient PCP/bariatric medicine f/u encouraged Significantly low or high BMI is associated with higher  medical risk including morbidity and mortality          Consultants: GI Cardiology Nephrology Surgery PT   Procedures: None   Antibiotics: None      30 Day Unplanned Readmission Risk Score    Flowsheet Row ED to  Hosp-Admission (Current) from 04/27/2024 in  4TH FLOOR PROGRESSIVE CARE AND UROLOGY  30 Day Unplanned Readmission Risk Score (%) 26.93 Filed at 05/04/2024 0400    This score is the patient's risk of an unplanned readmission within 30 days of being discharged (0 -100%). The score is based on dignosis, age, lab data, medications, orders, and past utilization.   Low:  0-14.9   Medium: 15-21.9   High: 22-29.9   Extreme: 30 and above           Subjective: Feeling bad today because she took the PO potassium pill and vomited after. Changed to powder and able to tolerate.  Abdominal pain is resolved.   Objective: Vitals:   05/04/24 0826 05/04/24 1205  BP: (!) 141/71 138/80  Pulse: 96 87  Resp: 17 18  Temp: 98.6 F (37 C) 98.7 F (37.1 C)  SpO2: 99% 98%    Intake/Output Summary (Last 24 hours) at 05/04/2024 1516 Last data filed at 05/04/2024 0934 Gross per 24 hour  Intake 240 ml  Output --  Net 240 ml   Filed Weights   04/28/24 0912 05/02/24 0548 05/04/24 0459  Weight: 80.7 kg 81 kg 80.3 kg    Exam:  General:  Appears calm and comfortable, NAD Eyes:  normal lids, iris ENT:  grossly normal hearing, lips & tongue, mmm; NG tube removed Cardiovascular:  RRR. No LE edema.  Respiratory:   CTA bilaterally with no wheezes/rales/rhonchi.  Normal respiratory effort. Abdomen:  soft, NT, ND Skin:  no rash or induration seen on limited exam Musculoskeletal:  grossly normal tone BUE/BLE, good ROM, no bony abnormality Psychiatric:  blunted mood and affect, speech fluent and appropriate, AOx3 Neurologic:  CN 2-12 grossly intact, moves all extremities in coordinated fashion  Data Reviewed: I have reviewed the patient's lab results since admission.  Pertinent labs for today include:   Na++ 148, improving K+ 3.3 CO2 19, stable BUN 13/Creatinine 1.80/GFR 29 WBC 8.8 Hgb 9.5, stable     Family Communication: None present  Mobility: PT/OT Consulted and are recommending  - Home Health Pt10/04/2024 1700    Code Status: Full Code   Disposition: Status is: Inpatient Remains inpatient appropriate because: ongoing monitoring, possibly home 10/14     Time spent: 50 minutes  Unresulted Labs (From admission, onward)     Start     Ordered   05/05/24 0500  Basic metabolic panel with GFR  Tomorrow morning,   R       Question:  Specimen collection method  Answer:  Lab=Lab collect   05/04/24 1516   05/05/24 0500  Phosphorus  Tomorrow morning,   R       Question:  Specimen collection method  Answer:  Lab=Lab collect   05/04/24 1516   05/05/24 0500  CBC with Differential/Platelet  Tomorrow morning,   R       Question:  Specimen collection method  Answer:  Lab=Lab collect   05/04/24 1516             Author: Delon Herald, MD 05/04/2024 3:16 PM  For on call review [email or web address].

## 2024-05-04 NOTE — Progress Notes (Signed)
   05/04/24 1253  PT Visit Information  Last PT Received On 05/04/24  Assistance Needed +1  Reason Eval/Treat Not Completed Medical issues which prohibited therapy  History of Present Illness 73 yo who presented on 10/7 with abdominal pain. CT with ileus. PMH: DM, HTN, stage 3 CKD, and COPD with recent admission for n/v associated with Mounjaro  (continued use) and COVID   Pt states that she has been having inc BM with movement today and declines OOB activity, reports inc BMs today vs yesterday. PT to continue to follow up.   Stann, PT Acute Rehabilitation Services Office: [phone] 05/04/2024

## 2024-05-04 NOTE — Evaluation (Signed)
 Occupational Therapy Evaluation Patient Details Name: Brooke [name] MRN: [id number] DOB: 10/30/1950 Today's Date: 05/04/2024   History of Present Illness   73 yo who presented on 10/7 with abdominal pain. CT with ileus. PMH: DM, HTN, stage 3 CKD, and COPD with recent admission for n/v associated with Mounjaro  (continued use) and COVID     Clinical Impressions PTA, patient lives at home with son and daughter but was independent and did not consistently use RW. Currently, patient presents with deficits outlined below (see OT Problem List for details) most significantly decreased balance and activity tolerance limiting BADL's (CGA/min A LB) and functional mobility performance (CGA). Patient requires continued Acute care hospital level OT services to progress safety and functional performance and allow for discharge. Recommending in addition to family assist and support upon discharge, HHOT and shower seat to allow for safety with bathing.     If plan is discharge home, recommend the following:   A little help with walking and/or transfers;A little help with bathing/dressing/bathroom;Assistance with cooking/housework;Assist for transportation;Help with stairs or ramp for entrance     Functional Status Assessment   Patient has had a recent decline in their functional status and demonstrates the ability to make significant improvements in function in a reasonable and predictable amount of time.     Equipment Recommendations   Tub/shower seat      Precautions/Restrictions   Precautions Precautions: Fall Restrictions Weight Bearing Restrictions Per Provider Order: No     Mobility Bed Mobility Overal bed mobility: Needs Assistance Bed Mobility: Sit to Supine     Supine to sit: Supervision, Modified independent (Device/Increase time) Sit to supine: Supervision   General bed mobility comments: increased time    Transfers Overall transfer level: Needs  assistance Equipment used: 1 person hand held assist, Rolling walker (2 wheels) Transfers: Sit to/from Stand, Bed to chair/wheelchair/BSC Sit to Stand: Supervision Stand pivot transfers: Contact guard assist         General transfer comment: amb to and from bathroom      Balance Overall balance assessment: Needs assistance Sitting-balance support: No upper extremity supported, Feet unsupported Sitting balance-Leahy Scale: Fair     Standing balance support: During functional activity, Single extremity supported Standing balance-Leahy Scale: Poor                             ADL either performed or assessed with clinical judgement   ADL Overall ADL's : Needs assistance/impaired Eating/Feeding: Independent   Grooming: Wash/dry hands;Wash/dry face;Sitting;Modified independent   Upper Body Bathing: Contact guard assist;Sitting   Lower Body Bathing: Minimal assistance;Sitting/lateral leans   Upper Body Dressing : Supervision/safety;Sitting   Lower Body Dressing: Minimal assistance;Sit to/from stand   Toilet Transfer: Contact guard assist;Rolling walker (2 wheels)   Toileting- Clothing Manipulation and Hygiene: Set up;Sitting/lateral lean       Functional mobility during ADLs: Contact guard assist;Rolling walker (2 wheels) General ADL Comments: decreased standing balance and LB reach     Vision Baseline Vision/History: 1 Wears glasses;0 No visual deficits              Pertinent Vitals/Pain Pain Assessment Pain Assessment: Faces Faces Pain Scale: No hurt     Extremity/Trunk Assessment Upper Extremity Assessment Upper Extremity Assessment: Right hand dominant;Overall [facility] for tasks assessed   Lower Extremity Assessment Lower Extremity Assessment: Overall WFL for tasks assessed   Cervical / Trunk Assessment Cervical / Trunk Assessment: Normal   Communication Communication  Communication: No apparent difficulties   Cognition Arousal:  Alert Behavior During Therapy: WFL for tasks assessed/performed Cognition: No apparent impairments                               Following commands: Intact       Cueing  General Comments   Cueing Techniques: Verbal cues  no edema or SOB noted           Home Living Family/patient expects to be discharged to:: Private residence Living Arrangements: Children;Other relatives Available Help at Discharge: Family Type of Home: House Home Access: Stairs to enter [employer] of Steps: 3 Entrance Stairs-Rails: Right;Can reach both;Left Home Layout: One level     Bathroom Shower/Tub: IT trainer: Standard Bathroom Accessibility: Yes How Accessible: Accessible via walker Home Equipment: Rollator (4 wheels);Cane - single point;Hand held shower head;Adaptive equipment Adaptive Equipment: Reacher Additional Comments: lives with son      Prior Functioning/Environment Prior Level of Function : Independent/Modified Independent             Mobility Comments: rollator for household amb ADLs Comments: reports dtr and sone assist as needed but she is typically ind with ADLs    OT Problem List: Impaired balance (sitting and/or standing);Decreased safety awareness;Decreased activity tolerance   OT Treatment/Interventions: Self-care/ADL training;Therapeutic exercise;Energy conservation;DME and/or AE instruction;Therapeutic activities;Patient/family education;Balance training      OT Goals(Current goals can be found in the care plan section)   Acute Rehab OT Goals Patient Stated Goal: to get better OT Goal Formulation: With patient Time For Goal Achievement: 05/18/24 Potential to Achieve Goals: Good   OT Frequency:  Min 2X/week       AM-PAC OT 6 Clicks Daily Activity     Outcome Measure Help from another person eating meals?: None Help from another person taking care of personal grooming?: A Little Help from  another person toileting, which includes using toliet, bedpan, or urinal?: A Little Help from another person bathing (including washing, rinsing, drying)?: A Little Help from another person to put on and taking off regular upper body clothing?: A Little Help from another person to put on and taking off regular lower body clothing?: A Little 6 Click Score: 19   End of Session Equipment Utilized During Treatment: Gait belt;Rolling walker (2 wheels) Nurse Communication: Mobility status;Other (comment) (BM and voiding info into flowsheets)  Activity Tolerance: Patient tolerated treatment well Patient left: in bed;with bed alarm set;with call bell/phone within reach  OT Visit Diagnosis: Unsteadiness on feet (R26.81)                Time: 1549-1610 OT Time Calculation (min): 21 min Charges:  OT General Charges $OT Visit: 1 Visit OT Evaluation $OT Eval Low Complexity: 1 Low  Aland Chestnutt OT/L Acute Rehabilitation Department  [phone]  05/04/2024, 4:51 PM

## 2024-05-05 ENCOUNTER — Telehealth: Payer: Self-pay | Admitting: Internal Medicine

## 2024-05-05 ENCOUNTER — Other Ambulatory Visit (HOSPITAL_COMMUNITY): Payer: Self-pay

## 2024-05-05 ENCOUNTER — Other Ambulatory Visit: Payer: Self-pay | Admitting: Allergy

## 2024-05-05 DIAGNOSIS — K567 Ileus, unspecified: Secondary | ICD-10-CM | POA: Diagnosis not present

## 2024-05-05 DIAGNOSIS — E66811 Obesity, class 1: Secondary | ICD-10-CM

## 2024-05-05 DIAGNOSIS — M1000 Idiopathic gout, unspecified site: Secondary | ICD-10-CM

## 2024-05-05 LAB — CBC WITH DIFFERENTIAL/PLATELET
Abs Immature Granulocytes: 0.36 K/uL — ABNORMAL HIGH (ref 0.00–0.07)
Basophils Absolute: 0 K/uL (ref 0.0–0.1)
Basophils Relative: 0 %
Eosinophils Absolute: 0 K/uL (ref 0.0–0.5)
Eosinophils Relative: 0 %
HCT: 30.9 % — ABNORMAL LOW (ref 36.0–46.0)
Hemoglobin: 9.8 g/dL — ABNORMAL LOW (ref 12.0–15.0)
Immature Granulocytes: 4 %
Lymphocytes Relative: 20 %
Lymphs Abs: 1.8 K/uL (ref 0.7–4.0)
MCH: 26.6 pg (ref 26.0–34.0)
MCHC: 31.7 g/dL (ref 30.0–36.0)
MCV: 84 fL (ref 80.0–100.0)
Monocytes Absolute: 0.9 K/uL (ref 0.1–1.0)
Monocytes Relative: 10 %
Neutro Abs: 6.1 K/uL (ref 1.7–7.7)
Neutrophils Relative %: 66 %
Platelets: 217 K/uL (ref 150–400)
RBC: 3.68 MIL/uL — ABNORMAL LOW (ref 3.87–5.11)
RDW: 18.9 % — ABNORMAL HIGH (ref 11.5–15.5)
Smear Review: NORMAL
WBC: 9.3 K/uL (ref 4.0–10.5)
nRBC: 0 % (ref 0.0–0.2)

## 2024-05-05 LAB — BASIC METABOLIC PANEL WITH GFR
Anion gap: 13 (ref 5–15)
BUN: 14 mg/dL (ref 8–23)
CO2: 19 mmol/L — ABNORMAL LOW (ref 22–32)
Calcium: 7.4 mg/dL — ABNORMAL LOW (ref 8.9–10.3)
Chloride: 120 mmol/L — ABNORMAL HIGH (ref 98–111)
Creatinine, Ser: 1.9 mg/dL — ABNORMAL HIGH (ref 0.44–1.00)
GFR, Estimated: 28 mL/min — ABNORMAL LOW (ref >60)
Glucose, Bld: 78 mg/dL (ref 70–99)
Potassium: 3 mmol/L — ABNORMAL LOW (ref 3.5–5.1)
Sodium: 151 mmol/L — ABNORMAL HIGH (ref 135–145)

## 2024-05-05 LAB — GLUCOSE, CAPILLARY
Glucose-Capillary: 91 mg/dL (ref 70–99)
Glucose-Capillary: 107 mg/dL — ABNORMAL HIGH (ref 70–99)

## 2024-05-05 LAB — PHOSPHORUS: Phosphorus: 3.4 mg/dL (ref 2.5–4.6)

## 2024-05-05 MED ORDER — DEXTROSE 5 % IV SOLN: INTRAVENOUS | Status: DC

## 2024-05-05 MED ORDER — POTASSIUM CHLORIDE 20 MEQ PO PACK
40.0000 meq | PACK | ORAL | Status: DC
  Administered 2024-05-05: 40 meq via ORAL
  Filled 2024-05-05: qty 2

## 2024-05-05 MED ORDER — POTASSIUM CHLORIDE 20 MEQ PO PACK
20.0000 meq | PACK | Freq: Every day | ORAL | 0 refills | Status: DC
  Filled 2024-05-05: qty 30, 30d supply, fill #0

## 2024-05-05 MED ORDER — SODIUM BICARBONATE 650 MG PO TABS
650.0000 mg | ORAL_TABLET | Freq: Two times a day (BID) | ORAL | 0 refills | Status: DC
  Filled 2024-05-05: qty 60, 30d supply, fill #0

## 2024-05-05 NOTE — TOC Progression Note (Signed)
 Transition of Care [facility]) - Progression Note   Patient Details  Name: Brooke [name] MRN: [id number] Date of Birth: 10/21/1950  [facility] CM/SW Contact  Duwaine GORMAN Aran, LCSW Phone Number: 05/05/2024, 2:06 PM  Clinical Narrative: PT/OT evaluations recommended HH. Patient is agreeable to [facility] services being set up. CSW faxed out [facility] referral in hub. Care management awaiting HH offers.  Expected Discharge Plan: Home w Home Health Services Barriers to Discharge: Continued Medical Work up  Expected Discharge Plan and Services In-house Referral: Clinical Social Work Discharge Planning Services: CM Consult Post Acute Care Choice: Home Health Living arrangements for the past 2 months: Single Family Home          DME Arranged: N/A DME Agency: NA  Social Drivers of Health (SDOH) Interventions SDOH Screenings   Food Insecurity: No Food Insecurity (04/28/2024)  Housing: Low Risk  (04/28/2024)  Transportation Needs: No Transportation Needs (04/28/2024)  Utilities: Not At Risk (04/28/2024)  Alcohol Screen: Low Risk  (09/24/2023)  Depression (PHQ2-9): Low Risk  (04/22/2024)  Financial Resource Strain: Low Risk  (09/24/2023)  Physical Activity: Insufficiently Active (09/24/2023)  Social Connections: Moderately Isolated (04/28/2024)  Stress: No Stress Concern Present (09/24/2023)  Tobacco Use: Low Risk  (04/28/2024)  Health Literacy: Adequate Health Literacy (09/24/2023)   Readmission Risk Interventions    05/05/2024   11:28 AM 04/16/2024    9:45 AM  Readmission Risk Prevention Plan  Transportation Screening Complete Complete  PCP or Specialist Appt within 3-5 Days  Complete  HRI or Home Care Consult Complete Complete  Social Work Consult for Recovery Care Planning/Counseling Complete Complete  Palliative Care Screening Not Applicable Not Applicable  Medication Review Oceanographer) Complete Complete

## 2024-05-05 NOTE — TOC Transition Note (Signed)
 Transition of Care [facility]) - Discharge Note  Patient Details  Name: Brooke [name] MRN: [id number] Date of Birth: 1950/11/13  Transition of Care [facility]) CM/SW Contact:  Duwaine GORMAN Aran, LCSW Phone Number: 05/05/2024, 3:25 PM  Clinical Narrative: [facility] accepted the referral for HHPT only. HH orders updated. CSW updated patient. Care management signing off.  Final next level of care: Home w Home Health Services Barriers to Discharge: Barriers Resolved  Patient Goals and CMS Choice Patient states their goals for this hospitalization and ongoing recovery are:: Home CMS Medicare.gov Compare Post Acute Care list provided to:: Patient (NA) Choice offered to / list presented to : Patient  Discharge Plan and Services Additional resources added to the After Visit Summary for   In-house Referral: Clinical Social Work Discharge Planning Services: CM Consult Post Acute Care Choice: Home Health          DME Arranged: N/A DME Agency: NA HH Arranged: PT HH Agency: [facility] Date HH Agency Contacted: 05/05/24  Social Drivers of Health (SDOH) Interventions SDOH Screenings   Food Insecurity: No Food Insecurity (04/28/2024)  Housing: Low Risk  (04/28/2024)  Transportation Needs: No Transportation Needs (04/28/2024)  Utilities: Not At Risk (04/28/2024)  Alcohol Screen: Low Risk  (09/24/2023)  Depression (PHQ2-9): Low Risk  (04/22/2024)  Financial Resource Strain: Low Risk  (09/24/2023)  Physical Activity: Insufficiently Active (09/24/2023)  Social Connections: Moderately Isolated (04/28/2024)  Stress: No Stress Concern Present (09/24/2023)  Tobacco Use: Low Risk  (04/28/2024)  Health Literacy: Adequate Health Literacy (09/24/2023)   Readmission Risk Interventions    05/05/2024   11:28 AM 04/16/2024    9:45 AM  Readmission Risk Prevention Plan  Transportation Screening Complete Complete  PCP or Specialist Appt within 3-5 Days  Complete  HRI or Home Care Consult Complete Complete  Social Work  Consult for Recovery Care Planning/Counseling Complete Complete  Palliative Care Screening Not Applicable Not Applicable  Medication Review Oceanographer) Complete Complete

## 2024-05-05 NOTE — Plan of Care (Signed)
 This RN educated patient on discharge instructions. Patient's belongings returned to patient. Patient's PIV removed. Patient brought in wheelchair to the main entrance.   Problem: Education: Goal: Knowledge of General Education information will improve Description: Including pain rating scale, medication(s)/side effects and non-pharmacologic comfort measures Outcome: Completed/Met   Problem: Health Behavior/Discharge Planning: Goal: Ability to manage health-related needs will improve Outcome: Completed/Met   Problem: Clinical Measurements: Goal: Ability to maintain clinical measurements within normal limits will improve Outcome: Completed/Met Goal: Will remain free from infection 05/05/2024 1606 by Sahej Hauswirth, Harlene DEL, RN Outcome: Completed/Met 05/05/2024 1332 by Philippa Harlene DEL, RN Outcome: Progressing Goal: Diagnostic test results will improve 05/05/2024 1606 by Leesa Leifheit, Harlene DEL, RN Outcome: Completed/Met 05/05/2024 1332 by Philippa Harlene DEL, RN Outcome: Progressing Goal: Respiratory complications will improve 05/05/2024 1606 by Lynanne Delgreco, Harlene DEL, RN Outcome: Completed/Met 05/05/2024 1332 by Philippa Harlene DEL, RN Outcome: Progressing Goal: Cardiovascular complication will be avoided 05/05/2024 1606 by Kenlea Woodell, Harlene DEL, RN Outcome: Completed/Met 05/05/2024 1332 by Philippa Harlene DEL, RN Outcome: Progressing   Problem: Activity: Goal: Risk for activity intolerance will decrease 05/05/2024 1606 by Savanah Bayles, Harlene DEL, RN Outcome: Completed/Met 05/05/2024 1332 by Ellsworth Waldschmidt, Harlene DEL, RN Outcome: Progressing   Problem: Nutrition: Goal: Adequate nutrition will be maintained Outcome: Completed/Met   Problem: Coping: Goal: Level of anxiety will decrease 05/05/2024 1606 by Talar Fraley, Harlene DEL, RN Outcome: Completed/Met 05/05/2024 1332 by Amberley Hamler, Harlene DEL, RN Outcome: Progressing   Problem: Elimination: Goal: Will not experience complications related to bowel motility Outcome:  Completed/Met Goal: Will not experience complications related to urinary retention Outcome: Completed/Met   Problem: Pain Managment: Goal: General experience of comfort will improve and/or be controlled Outcome: Completed/Met   Problem: Safety: Goal: Ability to remain free from injury will improve 05/05/2024 1606 by Burnis Kaser, Harlene DEL, RN Outcome: Completed/Met 05/05/2024 1332 by Philippa Harlene DEL, RN Outcome: Progressing   Problem: Skin Integrity: Goal: Risk for impaired skin integrity will decrease 05/05/2024 1606 by Azreal Stthomas, Harlene DEL, RN Outcome: Completed/Met 05/05/2024 1332 by Philippa Harlene DEL, RN Outcome: Progressing   Problem: Education: Goal: Ability to describe self-care measures that may prevent or decrease complications (Diabetes Survival Skills Education) will improve Outcome: Completed/Met Goal: Individualized Educational Video(s) Outcome: Completed/Met   Problem: Coping: Goal: Ability to adjust to condition or change in health will improve Outcome: Completed/Met   Problem: Fluid Volume: Goal: Ability to maintain a balanced intake and output will improve Outcome: Completed/Met   Problem: Health Behavior/Discharge Planning: Goal: Ability to identify and utilize available resources and services will improve Outcome: Completed/Met Goal: Ability to manage health-related needs will improve Outcome: Completed/Met   Problem: Metabolic: Goal: Ability to maintain appropriate glucose levels will improve Outcome: Completed/Met   Problem: Nutritional: Goal: Maintenance of adequate nutrition will improve Outcome: Completed/Met Goal: Progress toward achieving an optimal weight will improve Outcome: Completed/Met   Problem: Skin Integrity: Goal: Risk for impaired skin integrity will decrease Outcome: Completed/Met   Problem: Tissue Perfusion: Goal: Adequacy of tissue perfusion will improve Outcome: Completed/Met

## 2024-05-05 NOTE — Progress Notes (Signed)
 Discharge meds in a secure bag delivered to patient in room by this RN

## 2024-05-05 NOTE — Progress Notes (Signed)
 Mobility Specialist Progress Note:   05/05/24 1433  Mobility  Activity Ambulated with assistance  Level of Assistance Contact guard assist, steadying assist  Assistive Device Front wheel walker  Distance Ambulated (ft) 110 ft  Activity Response Tolerated well  Mobility Referral Yes  Mobility visit 1 Mobility  Mobility Specialist Start Time (ACUTE ONLY) 1415  Mobility Specialist Stop Time (ACUTE ONLY) 1425  Mobility Specialist Time Calculation (min) (ACUTE ONLY) 10 min   Pt was received in the bathroom and agreed to mobility. Returned to bed with all needs met. Call bell in reach.  [employer] - Mobility Specialist

## 2024-05-05 NOTE — Discharge Summary (Signed)
 Physician Discharge Summary   Patient: Brooke [name] MRN: [id number] DOB: 06-17-1951  Admit date:     04/27/2024  Discharge date: 05/05/24  Discharge Physician: Delon Herald   PCP: Norleen Lynwood ORN, MD   Recommendations at discharge:   You are being discharged with home health physical and occupational therapy Drink at least 2 liters of water  daily for at least the next 7 days Do NOT take Mounjaro  or other GLP-1 medications ever again! Follow up with Dr. Norleen later this week, will need repeat labs at that time You will need a pancreatic MRI in 07/2024, or sooner if new issues arise Referral made for nephrology and you should be contacted with an appointment  Discharge Diagnoses: Principal Problem:   Ileus [facility]) Active Problems:   Chest pain   Diabetes mellitus with chronic kidney disease (HCC)   Essential hypertension   GERD   Chronic kidney disease, stage 3b (HCC)   COPD (chronic obstructive pulmonary disease) (HCC)   AKI (acute kidney injury)   Hypernatremia   Class 1 obesity due to excess calories with body mass index (BMI) of 31.0 to 31.9 in adult   Hospital Course: 73yo with h/o DM, HTN, stage 3 CKD, and COPD with recent admission for n/v associated with Mounjaro  (continued use) and COVID who presented on 10/7 with abdominal pain.  CT with ileus.  Has been very slow to respond but Mounjaro  is likely mostly out of her system now and ileus has resolved.  Surgery and GI have signed off.  Marked AKI with hypernatremia, nephrology helpful in correcting and almost back to baseline.  Assessment and Plan:   Ileus  vs. SBO Multiple days of abdominal pain, decreased po intake following recent admission for colitis in setting of Mounjaro  use  CT imaging concerning for ileus on presentation Noted recurrent Mounjaro  use on 10/6 after prior dc with rapid worsening of abd pain and nausea  NPO/IVF with pain control and antiemetics  STOP Mounjaro  GI consulting NG tube placed to  LIWS Xray with persistent and progressive SB dilatation - SBO vs. Ileus Surgery consulted She is now having multiple BMs, appears to be resolving NG tube is out, patient is tolerating diet Surgery and GI have now both signed off Stable for dc to home   Hypernatremia Suspect that this is hypovolemic with output outpacing input Given boluses without improvement Increased IVF rate overnight without improvement Changed to D5W, more boluses Nephrology consulting More boluses Now finally improving Per nephrology Dr. Geralynn), she is stable for dc today   AKI on CKD stage IIIb with chronic metabolic acidosis Has been worsening with hypernatremia, but slightly improved this AM Attempt to avoid nephrotoxic medications Recheck BMP in PM and again AM  Placed on sodium bicarb tabs 650 mg twice daily for 1 week during last hospitalization and now resumed with improvement; will continue at dc H/o L renal artery stent, stable renal US  with Duplex Nephrology consulted This is also slowly improving Follow up with nephrology; referral placed   Chest pain Nonspecific chest pain over multiple days Baseline history of cardiac evaluation including Cath 2015 showed normal coronary arteries Repeat nuclear stress test 12/2016 for CP showed small moderate intensity anteroapical defect that was fixed and felt consistent with apical thinning, low risk study, EF 62%.  EKG w/ nonspecific changes  Trop negative x 2  Echo with preserved EF, grade 1 DD, no WMA Concern for LVOT obstruction on echo Consulted cardiology - started metoprolol  IV -> 50 mg  po BID -> resume Toprol  XL 100 mg BID Restart diltiazem  Rhythm appears to have stabilized Cardiology is signed off   Hypokalemia/hypophosphatemia Repleted K+ PO and IV Repleted with KPhos Will dc home with KDur 20 mEq PO daily  Needs repeat labs later this week   Hypertension Continue metoprolol , changed to IV  Resumed PO metoprolol  Stable at this time  but she may need increased dose as an outpatient   Possible pancreatic mass CT renal study in 01/2023 with 8mm hyperdense region in pancreatic tail Recommended for MRI in 07/2024 Consider imaging sooner if ongoing concerns   COPD Stable, no active wheezing Continue Trelegy   Diabetes mellitus type 2, NIDDM Last A1c 6.2 on 8/6, good control Resume Jardiance  STOP Mounjaro    Depression Continue Celexa , trazodone    Chronic anemia, normocytic Baseline hemoglobin 9-10 Currently at baseline   Hyperlipidemia Continue statin   History of glaucoma Continue outpatient eyedrops   Class 1 obesity Body mass index is 31.53 kg/m.SABRA  Weight loss should be encouraged STOP GLP-1 Outpatient PCP/bariatric medicine f/u encouraged Significantly low or high BMI is associated with higher medical risk including morbidity and mortality          Consultants: GI Cardiology Nephrology Surgery PT   Procedures: None   Antibiotics: None    Pain control - [place]  Controlled Substance Reporting System database was reviewed. and patient was instructed, not to drive, operate heavy machinery, perform activities at heights, swimming or participation in water  activities or provide baby-sitting services while on Pain, Sleep and Anxiety Medications; until their outpatient Physician has advised to do so again. Also recommended to not to take more than prescribed Pain, Sleep and Anxiety Medications.   Disposition: Home Diet recommendation:  Renal diet DISCHARGE MEDICATION: Allergies as of 05/05/2024       Reactions   Gnp Glp-1 Daily Support [germanium] Other (See Comments)   Ileus requiring hospitalization   Metformin  And Related Nausea And Vomiting   Ace Inhibitors Swelling, Other (See Comments)   Ankles swell   Codeine Nausea And Vomiting, Rash   Hydrocodone  Nausea And Vomiting   Tramadol  Nausea And Vomiting   Tylenol  [acetaminophen ] Itching, Rash, Other (See Comments)   Allergic to  prescription strength Tylenol . The patient is taking 500 mg Tylenol  in 2025, however.        Medication List     STOP taking these medications    potassium chloride  10 MEQ tablet Commonly known as: Klor-Con  10 Replaced by: potassium chloride  20 MEQ packet   tirzepatide  5 MG/0.5ML Pen Commonly known as: MOUNJARO        TAKE these medications    albuterol  108 (90 Base) MCG/ACT inhaler Commonly known as: VENTOLIN  HFA Inhale 2 puffs into the lungs every 4 (four) hours as needed for wheezing or shortness of breath.   allopurinol  100 MG tablet Commonly known as: ZYLOPRIM  TAKE 1 TABLET BY MOUTH EVERY DAY What changed:  when to take this reasons to take this   atropine 1 % ophthalmic solution Place 1 drop into the left eye daily.   Azelastine  HCl 137 MCG/SPRAY Soln PLACE 2 SPRAYS INTO BOTH NOSTRILS 2 (TWO) TIMES DAILY. USE IN EACH NOSTRIL AS DIRECTED What changed: See the new instructions.   brimonidine  0.2 % ophthalmic solution Commonly known as: ALPHAGAN  3 (three) times daily. 1 drop in the left eye three times daily What changed:  how much to take how to take this when to take this additional instructions   cetirizine  10 MG tablet  Commonly known as: ZYRTEC  TAKE 1 TABLET BY MOUTH EVERY DAY   citalopram  40 MG tablet Commonly known as: CELEXA  TAKE 1 TABLET BY MOUTH EVERY DAY   clonazePAM  0.5 MG tablet Commonly known as: KLONOPIN  Take 1 tablet (0.5 mg total) by mouth daily as needed (vertigo).   colchicine  0.6 MG tablet TAKE 0.5 TABLETS (0.3 MG TOTAL) BY MOUTH DAILY AS NEEDED (GOUT OR PSUEDOGOUT PAIN).   diltiazem  240 MG 24 hr capsule Commonly known as: CARDIZEM  CD Take 1 capsule (240 mg total) by mouth daily.   dorzolamide  2 % ophthalmic solution Commonly known as: TRUSOPT  Place 1 drop into the left eye 3 (three) times daily.   famotidine  20 MG tablet Commonly known as: Pepcid  Take 1 tablet (20 mg total) by mouth 2 (two) times daily.   Jardiance  25  MG Tabs tablet Generic drug: empagliflozin  TAKE 1 TABLET BY MOUTH EVERY DAY BEFORE BREAKFAST What changed: See the new instructions.   latanoprost  0.005 % ophthalmic solution Commonly known as: XALATAN  Place 1 drop into the left eye at bedtime.   levocetirizine 5 MG tablet Commonly known as: XYZAL  Take 1 tablet (5 mg total) by mouth daily.   meclizine  25 MG tablet Commonly known as: ANTIVERT  TAKE 1 TABLET BY MOUTH 3 TIMES A DAY AS NEEDED FOR DIZZINESS What changed: See the new instructions.   metoprolol  succinate 100 MG 24 hr tablet Commonly known as: TOPROL -XL TAKE 1 TABLET BY MOUTH 2 (TWO) TIMES DAILY. TAKE WITH OR IMMEDIATELY FOLLOWING A MEAL. What changed: See the new instructions.   omeprazole  40 MG capsule Commonly known as: PRILOSEC Take 1 capsule (40 mg total) by mouth daily. What changed:  when to take this additional instructions   ondansetron  4 MG disintegrating tablet Commonly known as: ZOFRAN -ODT Take 1 tablet (4 mg total) by mouth every 8 (eight) hours as needed for nausea or vomiting. What changed: reasons to take this   potassium chloride  20 MEQ packet Commonly known as: KLOR-CON  Take 20 mEq by mouth daily. Replaces: potassium chloride  10 MEQ tablet   rosuvastatin  40 MG tablet Commonly known as: CRESTOR  TAKE 1 TABLET BY MOUTH EVERY DAY   sodium bicarbonate  650 MG tablet Take 1 tablet (650 mg total) by mouth 2 (two) times daily.   traZODone  100 MG tablet Commonly known as: DESYREL  TAKE 1 TABLET (100 MG TOTAL) BY MOUTH AT BEDTIME AS NEEDED. FOR SLEEP What changed:  when to take this additional instructions   Trelegy Ellipta  200-62.5-25 MCG/ACT Aepb Generic drug: Fluticasone -Umeclidin-Vilant Inhale 1 puff into the lungs daily.   TYLENOL  500 MG tablet Generic drug: acetaminophen  Take 1,000 mg by mouth every 8 (eight) hours as needed for mild pain (pain score 1-3) (or headaches).        Follow-up Information     Norleen Lynwood ORN, MD. Schedule  an appointment as soon as possible for a visit in 3 day(s).   Specialties: Internal Medicine, Radiology Contact information: [address] [place] [address] [phone]         [place], [facility]. Schedule an appointment as soon as possible for a visit.   Contact information: [address] [place] [address] [phone]                Discharge Exam:   Subjective: Disappointed if she is not able to go home today.  Feeling ok otherwise.   Objective: Vitals:   05/05/24 0821 05/05/24 1255  BP: (!) 145/77 138/70  Pulse: (!) 101 84  Resp:  18  Temp:  98.3 F (36.8 C)  SpO2:  99%    Intake/Output Summary (Last 24 hours) at 05/05/2024 1433 Last data filed at 05/04/2024 2235 Gross per 24 hour  Intake 120 ml  Output --  Net 120 ml   Filed Weights   05/02/24 0548 05/04/24 0459 05/05/24 0500  Weight: 81 kg 80.3 kg 79.9 kg    Exam:  General:  Appears calm and comfortable, NAD Eyes:  normal lids, iris ENT:  grossly normal hearing, lips & tongue, mmm Cardiovascular:  RRR. No LE edema.  Respiratory:   CTA bilaterally with no wheezes/rales/rhonchi.  Normal respiratory effort. Abdomen:  soft, NT, ND Skin:  no rash or induration seen on limited exam Musculoskeletal:  grossly normal tone BUE/BLE, good ROM, no bony abnormality Psychiatric:  blunted mood and affect, speech fluent and appropriate, AOx3 Neurologic:  CN 2-12 grossly intact, moves all extremities in coordinated fashion  Data Reviewed: I have reviewed the patient's lab results since admission.  Pertinent labs for today include:   Na++ 151 K+ 3.0 BUN 14/Creatinine 1.9/GFR 28 WBC 9.3 Hgb 9.8, stable    Condition at discharge: stable  The results of significant diagnostics from this hospitalization (including imaging, microbiology, ancillary and laboratory) are listed below for reference.   Imaging Studies: DG Abd Portable 1V-Small Bowel Obstruction Protocol-initial, 8 hr  delay Result Date: 05/02/2024 EXAM: 1 VIEW XRAY OF THE ABDOMEN 05/02/2024 05:30:00 AM COMPARISON: 05/01/2024 CLINICAL HISTORY: 8 hour SBO protocol image FINDINGS: LINES, TUBES AND DEVICES: Enteric tube stable in place. BOWEL: Mildly regressed small bowel distension. Enteric contrast material is identified within the distal small bowel, colon, and rectum. SOFT TISSUES: Stable cholecystectomy clips. Left renal artery region vascular stent again noted. No opaque urinary calculi. BONES: No acute osseous abnormality. IMPRESSION: 1. Mildly regressed small bowel distension. 2. Enteric contrast present within the distal small bowel, colon, and rectum. Electronically signed by: Waddell Calk MD 05/02/2024 06:12 AM EDT RP Workstation: [id number]   VAS US  RENAL ARTERY DUPLEX Result Date: 05/01/2024 ABDOMINAL VISCERAL Patient Name:  Brooke [name] [facility]  Date of Exam:   05/01/2024 Medical Rec #: [id number]       Accession #:    [id number] Date of Birth: 30-Aug-1950       Patient Gender: F Patient Age:   79 years Exam Location:  [facility] Procedure:      VAS US  RENAL ARTERY DUPLEX Referring Phys: [id number] Carrieann Spielberg -------------------------------------------------------------------------------- Indications: Acute kidney injury [302455] High Risk Factors: Hypertension, hyperlipidemia, Diabetes. Limitations: Air/bowel gas, obesity, patient discomfort and patient movement, patient respiratory disturbance. Comparison Study: No prior studies. Performing Technologist: Cordella Collet RVT  Examination Guidelines: A complete evaluation includes B-mode imaging, spectral Doppler, color Doppler, and power Doppler as needed of all accessible portions of each vessel. Bilateral testing is considered an integral part of a complete examination. Limited examinations for reoccurring indications may be performed as noted.  Duplex Findings: +--------------------+--------+--------+------+--------------+ Mesenteric          PSV cm/sEDV  cm/sPlaque   Comments    +--------------------+--------+--------+------+--------------+ Aorta Mid             101      19                        +--------------------+--------+--------+------+--------------+ Celiac Artery Origin                      Not visualized +--------------------+--------+--------+------+--------------+ SMA  Origin                                Not visualized +--------------------+--------+--------+------+--------------+    +------------------+--------+--------+-------+ Right Renal ArteryPSV cm/sEDV cm/sComment +------------------+--------+--------+-------+ Origin               52      22           +------------------+--------+--------+-------+ Proximal             85      32           +------------------+--------+--------+-------+ Mid                  82      25           +------------------+--------+--------+-------+ Distal               93      23           +------------------+--------+--------+-------+ +-----------------+--------+--------+-------+ Left Renal ArteryPSV cm/sEDV cm/sComment +-----------------+--------+--------+-------+ Origin              52      14           +-----------------+--------+--------+-------+ Proximal            59      18           +-----------------+--------+--------+-------+ Mid                103      26           +-----------------+--------+--------+-------+ Distal              64      17           +-----------------+--------+--------+-------+ +------------+--------+--------+----+-----------+--------+--------+----+ Right KidneyPSV cm/sEDV cm/sRI  Left KidneyPSV cm/sEDV cm/sRI   +------------+--------+--------+----+-----------+--------+--------+----+ Upper Pole  41      13      0.68Upper Pole 52      10      0.80 +------------+--------+--------+----+-----------+--------+--------+----+ Mid         33      9       0.        51      13      0.74  +------------+--------+--------+----+-----------+--------+--------+----+ Lower Pole  42      12      0.70Lower Pole 53      14      0.73 +------------+--------+--------+----+-----------+--------+--------+----+ Hilar       93      19      0.79Hilar      70      19      0.73 +------------+--------+--------+----+-----------+--------+--------+----+ +------------------+----+------------------+----+ Right Kidney          Left Kidney            +------------------+----+------------------+----+ RAR                   RAR                    +------------------+----+------------------+----+ RAR (manual)      0.92RAR (manual)      1.02 +------------------+----+------------------+----+ Cortex                Cortex                 +------------------+----+------------------+----+ Cortex thickness      Corex thickness        +------------------+----+------------------+----+  Kidney length (cm)9.50Kidney length (cm)9.00 +------------------+----+------------------+----+  Summary: Renal:  Right: Normal size right kidney. Normal right Resisitive Index. No        evidence of right renal artery stenosis. Left:  Normal size of left kidney. Normal left Resistive Index. No        evidence of left renal artery stenosis.  *See table(s) above for measurements and observations.  Diagnosing physician: Gaile New MD  Electronically signed by Gaile New MD on 05/01/2024 at 9:57:11 PM.    Final    DG Abd 1 View Result Date: 05/01/2024 CLINICAL DATA:  NG tube advanced. EXAM: ABDOMEN - 1 VIEW COMPARISON:  May 01, 2024 (4:27 p.m.) FINDINGS: A nasogastric tube is seen with its distal tip overlying the expected region of the proximal duodenum. Multiple dilated loops of air-filled small bowel are again seen throughout the abdomen. Radiopaque surgical clips are seen within the right upper quadrant. No radio-opaque calculi or other significant radiographic abnormality are seen. IMPRESSION: 1.  Nasogastric tube positioning, as described above. 2. Findings consistent with a persistent small bowel obstruction. Electronically Signed   By: Suzen Dials M.D.   On: 05/01/2024 18:04   DG Abd Portable 1V-Small Bowel Protocol-Position Verification Result Date: 05/01/2024 EXAM: 1 VIEW XRAY OF THE ABDOMEN 05/01/2024 04:31:00 PM COMPARISON: 05/01/2024 CLINICAL HISTORY: Small bowel obstruction (SBO). FINDINGS: LINES, TUBES AND DEVICES: Nasogastric tube extends to the distal esophagus. BOWEL: Stomach appears decompressed. Several gas dilated mid abdominal small bowel loops, slightly decreased in number and degree of dilatation. The colon appears decompressed. SOFT TISSUES: Cholecystectomy clips. No opaque urinary calculi. BONES: No acute osseous abnormality. IMPRESSION: 1. Small bowel obstruction, slightly improved compared to 05/01/2024. 2. Gastric tube  extends only to distal esophagus. Electronically signed by: Katheleen Faes MD 05/01/2024 04:38 PM EDT RP Workstation: [id number]   DG Abd 1 View Result Date: 05/01/2024 EXAM: 1 VIEW XRAY OF THE ABDOMEN 05/01/2024 05:42:00 AM COMPARISON: 04/30/2024 CLINICAL HISTORY: Ileus (HCC) 01250. ileus. FINDINGS: LINES, TUBES AND DEVICES: Enteric tube terminates in the stomach. Left renal artery stent noted. BOWEL: Persistent and progressive dilated small bowel loops in the left mid and right lower quadrant abdomen. SOFT TISSUES: Surgical clips in the right upper quadrant of the abdomen. No opaque urinary calculi. BONES: No acute osseous abnormality. IMPRESSION: 1. Persistent and progressive small bowel dilatation compatible with either mid to distal small bowel obstruction versus ileus. 2. Enteric tube terminates in the stomach. 3. Postsurgical changes with right upper quadrant surgical clips and left renal artery stent. Electronically signed by: Waddell Calk MD 05/01/2024 08:34 AM EDT RP Workstation: [id number]   DG Abd 1 View Result Date: 04/30/2024 EXAM: 1 VIEW  XRAY OF THE ABDOMEN 04/30/2024 05:20:00 AM COMPARISON: 04/29/2024 CLINICAL HISTORY: Ileus (HCC) 01250. ileus FINDINGS: LINES, TUBES AND DEVICES: Enteric tube is in place with tip in side port below the GE junction. The tube has been slightly retracted with the side hole approximately 2 cm below the GE junction. Left renal artery stent noted. BOWEL: Single dilated loop of small bowel within the left upper quadrant, up to 3.8 cm in diameter. SOFT TISSUES: Right upper quadrant surgical clips noted. No opaque urinary calculi. BONES: No acute osseous abnormality. IMPRESSION: 1. Single dilated loop of small bowel in the left abdomen measuring up to 3.8 cm, suggestive of focal ileus. 2. Slight retraction of the enteric tube. The side hole for the tube is 2 cm below the ge junction. Electronically signed by: Waddell Calk MD 04/30/2024 07:46 AM EDT RP  Workstation: [id number]   DG Abd 1 View Result Date: 04/29/2024 CLINICAL DATA:  NG tube placement. EXAM: ABDOMEN - 1 VIEW COMPARISON:  Same day abdominal radiograph dated 04/29/2024 at 9:44 a.m. FINDINGS: Enteric tube tip within the stomach. Visualized lung bases are clear. IMPRESSION: Enteric tube tip within the stomach. Electronically Signed   By: Harrietta Sherry M.D.   On: 04/29/2024 17:04   ECHOCARDIOGRAM COMPLETE Result Date: 04/29/2024    ECHOCARDIOGRAM REPORT   Patient Name:   Brooke [name] [facility] Date of Exam: 04/29/2024 Medical Rec #:  [id number]      Height:       63.0 in Accession #:    [id number]     Weight:       178.0 lb Date of Birth:  06-14-1951      BSA:          1.840 m Patient Age:    73 years       BP:           177/81 mmHg Patient Gender: F              HR:           119 bpm. Exam Location:  Inpatient Procedure: 2D Echo, Cardiac Doppler and Color Doppler (Both Spectral and Color            Flow Doppler were utilized during procedure). Indications:    Chest Pain R07.9  History:        Patient has prior history of Echocardiogram examinations, most                  recent 11/10/2021. COPD, Signs/Symptoms:Chest Pain; Risk                 Factors:Diabetes and Hypertension. H/O Chronic kidney disease.  Sonographer:    Bernarda Rocks Referring Phys: [phone] STEVEN J NEWTON IMPRESSIONS  1. Left ventricular ejection fraction, by estimation, is 70 to 75%. The left ventricle has hyperdynamic function. The left ventricle has no regional wall motion abnormalities. There is mild left ventricular hypertrophy. Left ventricular diastolic parameters are consistent with Grade I diastolic dysfunction (impaired relaxation).  2. Findings concerning for LVOT obstruction. Details below.  3. Right ventricular systolic function is normal. The right ventricular size is normal. Mildly increased right ventricular wall thickness. There is moderately elevated pulmonary artery systolic pressure.  4. The mitral valve is normal in structure. No evidence of mitral valve regurgitation. No evidence of mitral stenosis.  5. The aortic valve is normal in structure. Aortic valve regurgitation is not visualized. No aortic stenosis is present.  6. The inferior vena cava is normal in size with greater than 50% respiratory variability, suggesting right atrial pressure of 3 mmHg. Comparison(s): A prior study was performed on 11/10/2021. LV outflow tract gradient now elevated. FINDINGS  Left Ventricle: There is a mid cavitary gradient of 41 mmHg at rest and was 39 mmHg with valsalva but in a different location and may be underestimated. This is may be concerning for LVOT obstruction. Left ventricular ejection fraction, by estimation, is 70 to 75%. The left ventricle has hyperdynamic function. The left ventricle has no regional wall motion abnormalities. The left ventricular internal cavity size was small. There is mild left ventricular hypertrophy. Left ventricular diastolic parameters are consistent with Grade I diastolic dysfunction (impaired relaxation). Right Ventricle: The right ventricular size is normal. Mildly  increased right ventricular wall thickness. Right ventricular systolic function is normal. There is moderately elevated pulmonary artery  systolic pressure. The tricuspid regurgitant velocity is 3.39 m/s, and with an assumed right atrial pressure of 3 mmHg, the estimated right ventricular systolic pressure is 49.0 mmHg. Left Atrium: Left atrial size was normal in size. Right Atrium: Right atrial size was normal in size. Pericardium: Trivial pericardial effusion is present. The pericardial effusion is anterior to the right ventricle. Mitral Valve: The mitral valve is normal in structure. No evidence of mitral valve regurgitation. No evidence of mitral valve stenosis. MV peak gradient, 9.1 mmHg. The mean mitral valve gradient is 4.0 mmHg. Tricuspid Valve: The tricuspid valve is normal in structure. Tricuspid valve regurgitation is trivial. No evidence of tricuspid stenosis. Aortic Valve: The aortic valve is normal in structure. Aortic valve regurgitation is not visualized. No aortic stenosis is present. Aortic valve mean gradient measures 13.0 mmHg. Aortic valve peak gradient measures 27.2 mmHg. Aortic valve area, by VTI measures 1.60 cm. Pulmonic Valve: The pulmonic valve was normal in structure. Pulmonic valve regurgitation is not visualized. No evidence of pulmonic stenosis. Aorta: The aortic root is normal in size and structure. Venous: The inferior vena cava is normal in size with greater than 50% respiratory variability, suggesting right atrial pressure of 3 mmHg. IAS/Shunts: No atrial level shunt detected by color flow Doppler.  LEFT VENTRICLE PLAX 2D LVIDd:         2.70 cm     Diastology LVIDs:         1.70 cm     LV e' medial:    6.68 cm/s LV PW:         1.20 cm     LV E/e' medial:  15.4 LV IVS:        1.20 cm     LV e' lateral:   8.55 cm/s LVOT diam:     1.50 cm     LV E/e' lateral: 12.0 LV SV:         59 LV SV Index:   32 LVOT Area:     1.77 cm  LV Volumes (MOD) LV vol d, MOD A2C: 80.9 ml LV vol d, MOD  A4C: 79.1 ml LV vol s, MOD A2C: 22.3 ml LV vol s, MOD A4C: 32.1 ml LV SV MOD A2C:     58.6 ml LV SV MOD A4C:     79.1 ml LV SV MOD BP:      55.5 ml RIGHT VENTRICLE             IVC RV Basal diam:  2.80 cm     IVC diam: 1.40 cm RV S prime:     27.10 cm/s TAPSE (M-mode): 2.1 cm RVSP:           49.0 mmHg LEFT ATRIUM             Index        RIGHT ATRIUM           Index LA diam:        3.20 cm 1.74 cm/m   RA Pressure: 3.00 mmHg LA Vol (A2C):   31.9 ml 17.34 ml/m  RA Area:     8.39 cm LA Vol (A4C):   49.4 ml 26.84 ml/m  RA Volume:   14.80 ml  8.04 ml/m LA Biplane Vol: 41.2 ml 22.39 ml/m  AORTIC VALVE                     PULMONIC VALVE AV Area (Vmax):    1.58 cm  PV Vmax:          1.85 m/s AV Area (Vmean):   1.51 cm      PV Peak grad:     13.7 mmHg AV Area (VTI):     1.60 cm      PR End Diast Vel: 3.25 msec AV Vmax:           261.00 cm/s AV Vmean:          168.000 cm/s AV VTI:            0.370 m AV Peak Grad:      27.2 mmHg AV Mean Grad:      13.0 mmHg LVOT Vmax:         234.00 cm/s LVOT Vmean:        144.000 cm/s LVOT VTI:          0.335 m LVOT/AV VTI ratio: 0.91  AORTA Ao Root diam: 2.40 cm Ao Asc diam:  2.80 cm MITRAL VALVE                TRICUSPID VALVE MV Area (PHT): 8.25 cm     TR Peak grad:   46.0 mmHg MV Area VTI:   2.84 cm     TR Vmax:        339.00 cm/s MV Peak grad:  9.1 mmHg     Estimated RAP:  3.00 mmHg MV Mean grad:  4.0 mmHg     RVSP:           49.0 mmHg MV Vmax:       1.51 m/s MV Vmean:      95.8 cm/s    SHUNTS MV Decel Time: 92 msec      Systemic VTI:  0.34 m MV E velocity: 103.00 cm/s  Systemic Diam: 1.50 cm MV A velocity: 158.00 cm/s MV E/A ratio:  0.65 Emeline Calender Electronically signed by Emeline Calender Signature Date/Time: 04/29/2024/3:45:42 PM    Final    DG Abd 1 View Result Date: 04/29/2024 CLINICAL DATA:  Ileus. EXAM: ABDOMEN - 1 VIEW COMPARISON:  CT abdomen and pelvis 04/28/2024 FINDINGS: Bowel gas in the small and large bowel. Small bowel loops measuring up to 3.4 cm. Gas in the  rectum. Cholecystectomy clips. Left renal artery stent. No significant calcifications overlying the kidneys or expected course of the ureters. IMPRESSION: 1. Gas within small and large bowel. Findings are nonspecific but could be associated with an ileus. No significant change since 04/28/2024. Electronically Signed   By: Juliene Balder M.D.   On: 04/29/2024 10:22   CT ABDOMEN PELVIS WO CONTRAST Result Date: 04/28/2024 CLINICAL DATA:  Right upper quadrant pain EXAM: CT ABDOMEN AND PELVIS WITHOUT CONTRAST TECHNIQUE: Multidetector CT imaging of the abdomen and pelvis was performed following the standard protocol without IV contrast. RADIATION DOSE REDUCTION: This exam was performed according to the departmental dose-optimization program which includes automated exposure control, adjustment of the mA and/or kV according to patient size and/or use of iterative reconstruction technique. COMPARISON:  04/14/2024 FINDINGS: Lower chest: No acute abnormality. Hepatobiliary: Gallbladder has been surgically removed. Stable cyst is noted within the right lobe of the liver. Pancreas: Unremarkable. No pancreatic ductal dilatation or surrounding inflammatory changes. Spleen: Normal in size without focal abnormality. Adrenals/Urinary Tract: Adrenal glands are within normal limits. Kidneys demonstrate no renal calculi or obstructive changes. Bladder is decompressed. Stomach/Bowel: Scattered diverticular change of the colon is noted. No evidence of diverticulitis is seen. The appendix is within normal limits. Generalized  small bowel dilatation is noted which extends to the level of the terminal ileum without obstructing mass. Stomach is decompressed. Vascular/Lymphatic: Aortic atherosclerosis. No enlarged abdominal or pelvic lymph nodes. Reproductive: Status post hysterectomy. No adnexal masses. Other: No abdominal wall hernia or abnormality. No abdominopelvic ascites. Musculoskeletal: No acute or significant osseous findings.  IMPRESSION: Generalized small bowel dilatation extending to the terminal ileum. No obstructing mass lesion is seen in these changes may represent a generalized small bowel ileus. Correlate with physical exam. Diverticulosis without diverticulitis. Electronically Signed   By: Oneil Devonshire M.D.   On: 04/28/2024 03:29   CT ABDOMEN PELVIS WO CONTRAST Result Date: 04/14/2024 EXAM: CT ABDOMEN AND PELVIS WITHOUT CONTRAST 04/14/2024 11:29:31 PM TECHNIQUE: CT of the abdomen and pelvis was performed without the administration of intravenous contrast. Multiplanar reformatted images are provided for review. Automated exposure control, iterative reconstruction, and/or weight-based adjustment of the mA/kV was utilized to reduce the radiation dose to as low as reasonably achievable. COMPARISON: 02/18/2024 CLINICAL HISTORY: Abdominal pain, acute, nonlocalized. Emesis over the past 2 weeks. Reports unable to tolerate fluids. Reports emesis x2 today. No coffee emesis/ bright red blood. No fevers. No diarrhea. Does endorse dizziness with changing position - sts this has not changed since the beginning of this year. FINDINGS: LOWER CHEST: Patchy ground-glass opacities in the bilateral lower lobes, mildly progressive, favoring postinfectious inflammatory scarring versus atelectasis. LIVER: 2.0 cm right hepatic cyst (image 9), benign. GALLBLADDER AND BILE DUCTS: Status post cholecystectomy. No biliary ductal dilatation. SPLEEN: No acute abnormality. PANCREAS: No acute abnormality. ADRENAL GLANDS: No acute abnormality. KIDNEYS, URETERS AND BLADDER: No stones in the kidneys or ureters. No hydronephrosis. No perinephric or periureteral stranding. Urinary bladder is unremarkable. GI AND BOWEL: Stomach demonstrates no acute abnormality. There is no bowel obstruction. Normal appendix (image 54). Left colonic diverticulosis, without evidence of diverticulitis. Very mild pericolonic stranding along the ascending colon (image 32), suggesting  mild infectious inflammatory colitis. PERITONEUM AND RETROPERITONEUM: No ascites. No free air. VASCULATURE: Aorta is normal in caliber. LYMPH NODES: No lymphadenopathy. REPRODUCTIVE ORGANS: Status post hysterectomy. BONES AND SOFT TISSUES: No acute osseous abnormality. 4.0 cm simple lipoma along the left lateral rectus muscle in the lower abdominal wall (image 62). IMPRESSION: 1. Suspected mild infectious inflammatory colitis involving the ascending colon. 2. Left colonic diverticulosis, without evidence of diverticulitis. Electronically signed by: Pinkie Pebbles MD 04/14/2024 11:41 PM EDT RP Workstation: [id number]   VAS US  CAROTID Result Date: 04/10/2024 Carotid Arterial Duplex Study Patient Name:  Brooke [name]  Date of Exam:   04/10/2024 Medical Rec #: [id number]       Accession #:    [id number] Date of Birth: 01-24-1951       Patient Gender: F Patient Age:   74 years Exam Location:  [address] Procedure:      VAS US  CAROTID Referring Phys: LYNWOOD RUSH --------------------------------------------------------------------------------  Indications:       Carotid artery disease. Dizziness. History of syncope Limitations        Today's exam was technicallly difficult due to body habitus                    and due to the depth of arteries. Comparison Study:  No significant change since exam of 11/13/2013 Performing Technologist: Devere Dark RVT  Examination Guidelines: A complete evaluation includes B-mode imaging, spectral Doppler, color Doppler, and power Doppler as needed of all accessible portions of each vessel. Bilateral testing is considered an integral part of a complete  examination. Limited examinations for reoccurring indications may be performed as noted.  Right Carotid Findings: +----------+--------+--------+--------+------------------+--------+           PSV cm/sEDV cm/sStenosisPlaque DescriptionComments +----------+--------+--------+--------+------------------+--------+ CCA Prox  82       19                                         +----------+--------+--------+--------+------------------+--------+ CCA Mid   67      19                                         +----------+--------+--------+--------+------------------+--------+ CCA Distal49      15                                         +----------+--------+--------+--------+------------------+--------+ ICA Prox  58      17                                         +----------+--------+--------+--------+------------------+--------+ ICA Mid   56      18                                         +----------+--------+--------+--------+------------------+--------+ ICA Distal51      17                                         +----------+--------+--------+--------+------------------+--------+ ECA       68      6                                          +----------+--------+--------+--------+------------------+--------+ +----------+--------+-------+----------------+-------------------+           PSV cm/sEDV cmsDescribe        Arm Pressure (mmHG) +----------+--------+-------+----------------+-------------------+ [id number]     1      Multiphasic, [id number]                 +----------+--------+-------+----------------+-------------------+ +---------+--------+--+--------+--+---------+ VertebralPSV cm/s50EDV cm/s14Antegrade +---------+--------+--+--------+--+---------+  Left Carotid Findings: +----------+--------+--------+--------+------------------+--------+           PSV cm/sEDV cm/sStenosisPlaque DescriptionComments +----------+--------+--------+--------+------------------+--------+ CCA Prox  89      17                                         +----------+--------+--------+--------+------------------+--------+ CCA Mid   68      13                                         +----------+--------+--------+--------+------------------+--------+ CCA Distal68      14                                          +----------+--------+--------+--------+------------------+--------+  ICA Prox  61      13                                         +----------+--------+--------+--------+------------------+--------+ ICA Mid   47      15                                         +----------+--------+--------+--------+------------------+--------+ ICA Distal72      26                                         +----------+--------+--------+--------+------------------+--------+ ECA       54      9                                          +----------+--------+--------+--------+------------------+--------+ +----------+--------+--------+----------------+-------------------+           PSV cm/sEDV cm/sDescribe        Arm Pressure (mmHG) +----------+--------+--------+----------------+-------------------+ Subclavian121     2       Multiphasic, [id number]                 +----------+--------+--------+----------------+-------------------+ +---------+--------+--+--------+--+---------+ VertebralPSV cm/s61EDV cm/s17Antegrade +---------+--------+--+--------+--+---------+   Summary: Right Carotid: There is no evidence of stenosis in the right ICA. Left Carotid: There is no evidence of stenosis in the left ICA. Vertebrals:  Bilateral vertebral arteries demonstrate antegrade flow. Subclavians: Normal flow hemodynamics were seen in bilateral subclavian              arteries. *See table(s) above for measurements and observations.  Electronically signed by Fonda Rim on 04/10/2024 at 3:39:39 PM.    Final     Microbiology: Results for orders placed or performed during the hospital encounter of 04/14/24  Resp panel by RT-PCR (RSV, Flu A&B, Covid) Anterior Nasal Swab     Status: Abnormal   Collection Time: 04/14/24 10:47 PM   Specimen: Anterior Nasal Swab  Result Value Ref Range Status   SARS Coronavirus 2 by RT PCR POSITIVE (A) NEGATIVE Final    Comment: (NOTE) SARS-CoV-2 target nucleic  acids are DETECTED.  The SARS-CoV-2 RNA is generally detectable in upper respiratory specimens during the acute phase of infection. Positive results are indicative of the presence of the identified virus, but do not rule out bacterial infection or co-infection with other pathogens not detected by the test. Clinical correlation with patient history and other diagnostic information is necessary to determine patient infection status. The expected result is Negative.  Fact Sheet for Patients: BloggerCourse.com  Fact Sheet for Healthcare Providers: SeriousBroker.it  This test is not yet approved or cleared by the [place]  FDA and  has been authorized for detection and/or diagnosis of SARS-CoV-2 by FDA under an Emergency Use Authorization (EUA).  This EUA will remain in effect (meaning this test can be used) for the duration of  the COVID-19 declaration under Section 564(b)(1) of the A ct, 21 U.S.C. section 360bbb-3(b)(1), unless the authorization is terminated or revoked sooner.     Influenza A by PCR NEGATIVE NEGATIVE Final   Influenza B by PCR NEGATIVE NEGATIVE Final  Comment: (NOTE) The Xpert Xpress SARS-CoV-2/FLU/RSV plus assay is intended as an aid in the diagnosis of influenza from Nasopharyngeal swab specimens and should not be used as a sole basis for treatment. Nasal washings and aspirates are unacceptable for Xpert Xpress SARS-CoV-2/FLU/RSV testing.  Fact Sheet for Patients: BloggerCourse.com  Fact Sheet for Healthcare Providers: SeriousBroker.it  This test is not yet approved or cleared by the [place]  FDA and has been authorized for detection and/or diagnosis of SARS-CoV-2 by FDA under an Emergency Use Authorization (EUA). This EUA will remain in effect (meaning this test can be used) for the duration of the COVID-19 declaration under Section 564(b)(1) of  the Act, 21 U.S.C. section 360bbb-3(b)(1), unless the authorization is terminated or revoked.     Resp Syncytial Virus by PCR NEGATIVE NEGATIVE Final    Comment: (NOTE) Fact Sheet for Patients: BloggerCourse.com  Fact Sheet for Healthcare Providers: SeriousBroker.it  This test is not yet approved or cleared by the [place]  FDA and has been authorized for detection and/or diagnosis of SARS-CoV-2 by FDA under an Emergency Use Authorization (EUA). This EUA will remain in effect (meaning this test can be used) for the duration of the COVID-19 declaration under Section 564(b)(1) of the Act, 21 U.S.C. section 360bbb-3(b)(1), unless the authorization is terminated or revoked.  Performed at [facility], [address] [place], [place] [address]     Labs: CBC: Recent Labs  Lab 05/01/24 [phone] 05/02/24 0340 05/03/24 0420 05/04/24 0342 05/05/24 0414  WBC 6.4 9.0 9.8 8.8 9.3  NEUTROABS 3.9 5.8 6.0 5.6 6.1  HGB 10.5* 10.3* 9.3* 9.5* 9.8*  HCT 34.0* 33.5* 30.3* 30.5* 30.9*  MCV 85.9 86.8 87.1 85.9 84.0  PLT 236 226 195 210 217   Basic Metabolic Panel: Recent Labs  Lab 05/01/24 1341 05/01/24 1830 05/02/24 0339 05/02/24 0340 05/02/24 1544 05/03/24 0420 05/04/24 0342 05/05/24 0414  NA 158*   < >  --  156*  155* 151* 149* 148* 151*  K 3.2*   < >  --  3.4*  3.4* 3.6 3.2* 3.3* 3.0*  CL 127*   < >  --  126*  127* 122* 121* 118* 120*  CO2 18*   < >  --  20*  20* 19* 20* 19* 19*  GLUCOSE 119*   < >  --  111*  111* 139* 112* 87 78  BUN 28*   < >  --  22  22 18 15 13 14   CREATININE 2.11*   < >  --  1.94*  1.91* 1.84* 1.87* 1.80* 1.90*  CALCIUM  9.3   < >  --  9.0  9.0 8.6* 8.1* 7.4* 7.4*  MG 2.2  --  2.0  --   --   --   --   --   PHOS  --   --   --  <1.0* 1.8* 1.8*  --  3.4   < > = values in this interval not displayed.   Liver Function Tests: Recent Labs  Lab 05/02/24 0340 05/02/24 1544   ALBUMIN 2.8* 2.6*   CBG: Recent Labs  Lab 05/04/24 0720 05/04/24 1144 05/04/24 1621 05/05/24 0720 05/05/24 1103  GLUCAP 85 95 89 91 107*    Discharge time spent: greater than 30 minutes.  Signed: Delon Herald, MD [facility] 05/05/2024

## 2024-05-06 NOTE — Telephone Encounter (Signed)
 Copied from CRM [id number]. Topic: Clinical - Home Health Verbal Orders >> May 06, 2024  2:45 PM Carlyon D wrote: Caller/Agency: [facility]  Callback Number: [phone] Service Requested: Physical Therapy Frequency: 1 week 1, 2 week 3, 1 week 3 Any new concerns about the patient? No

## 2024-05-08 ENCOUNTER — Telehealth: Payer: Self-pay

## 2024-05-08 DIAGNOSIS — I129 Hypertensive chronic kidney disease with stage 1 through stage 4 chronic kidney disease, or unspecified chronic kidney disease: Secondary | ICD-10-CM | POA: Diagnosis not present

## 2024-05-08 DIAGNOSIS — E876 Hypokalemia: Secondary | ICD-10-CM | POA: Diagnosis not present

## 2024-05-08 DIAGNOSIS — E872 Acidosis, unspecified: Secondary | ICD-10-CM | POA: Diagnosis not present

## 2024-05-08 DIAGNOSIS — N1832 Chronic kidney disease, stage 3b: Secondary | ICD-10-CM | POA: Diagnosis not present

## 2024-05-08 DIAGNOSIS — E870 Hyperosmolality and hypernatremia: Secondary | ICD-10-CM | POA: Diagnosis not present

## 2024-05-08 DIAGNOSIS — Z791 Long term (current) use of non-steroidal anti-inflammatories (NSAID): Secondary | ICD-10-CM | POA: Diagnosis not present

## 2024-05-08 DIAGNOSIS — N2581 Secondary hyperparathyroidism of renal origin: Secondary | ICD-10-CM | POA: Diagnosis not present

## 2024-05-08 DIAGNOSIS — K567 Ileus, unspecified: Secondary | ICD-10-CM | POA: Diagnosis not present

## 2024-05-08 DIAGNOSIS — D631 Anemia in chronic kidney disease: Secondary | ICD-10-CM | POA: Diagnosis not present

## 2024-05-08 DIAGNOSIS — E1122 Type 2 diabetes mellitus with diabetic chronic kidney disease: Secondary | ICD-10-CM | POA: Diagnosis not present

## 2024-05-08 NOTE — Telephone Encounter (Signed)
 Already called and gave verbals today.

## 2024-05-08 NOTE — Transitions of Care (Post Inpatient/ED Visit) (Signed)
   05/08/2024  Name: Brooke [name] MRN: [id number] DOB: [birth year]  Today's TOC FU Call Status: Today's TOC FU Call Status:: Unsuccessful Call (1st Attempt) Unsuccessful Call (1st Attempt) Date: 05/08/24  Attempted to reach the patient regarding the most recent Inpatient/ED visit.  Follow Up Plan: Additional outreach attempts will be made to reach the patient to complete the Transitions of Care (Post Inpatient/ED visit) call.   Shona Prow RN, CCM [place]  VBCI-Population Health RN Care Manager [phone]

## 2024-05-08 NOTE — Telephone Encounter (Signed)
 Called & gave verbals today.

## 2024-05-08 NOTE — Telephone Encounter (Signed)
 Ok for verbal

## 2024-05-08 NOTE — Telephone Encounter (Signed)
 Copied from CRM [id number]. Topic: General - Other >> May 07, 2024  4:46 PM Lauren C wrote: Reason for CRM: Monique from [facility] home health calling for an update on verbal orders Caller/Agency: [facility]  Callback Number: [phone] Service Requested: Physical Therapy Frequency: 1 week 1, 2 week 3, 1 week 3 Any new concerns about the patient? No Requesting call back. No new concerns.

## 2024-05-08 NOTE — Telephone Encounter (Signed)
 Copied from CRM [id number]. Topic: General - Other >> May 08, 2024  8:59 AM Berneda FALCON wrote: Reason for CRM: [facility] calling to check on the status of the PT [facility] orders she requested for this patient on 05/06/24. Called the CAL and was informed this was put in as a refill request. Rocky states she will try to bring this to the forefront of the PCP's attention, as the PT wanted to begin the PT treatment today.   Monique's Callback Number: [phone]

## 2024-05-08 NOTE — Telephone Encounter (Signed)
 Called and gave verbals.

## 2024-05-11 DIAGNOSIS — N1832 Chronic kidney disease, stage 3b: Secondary | ICD-10-CM | POA: Diagnosis not present

## 2024-05-12 ENCOUNTER — Other Ambulatory Visit: Payer: Self-pay

## 2024-05-12 ENCOUNTER — Encounter (HOSPITAL_COMMUNITY): Payer: Self-pay

## 2024-05-12 ENCOUNTER — Inpatient Hospital Stay (HOSPITAL_COMMUNITY)
Admission: EM | Admit: 2024-05-12 | Discharge: 2024-05-17 | DRG: 641 | Disposition: A | Discharge status: Home Health | Attending: Internal Medicine | Admitting: Internal Medicine

## 2024-05-12 ENCOUNTER — Ambulatory Visit: Admitting: Family Medicine

## 2024-05-12 ENCOUNTER — Telehealth: Payer: Self-pay

## 2024-05-12 ENCOUNTER — Ambulatory Visit: Payer: Self-pay | Admitting: Family Medicine

## 2024-05-12 ENCOUNTER — Encounter: Payer: Self-pay | Admitting: Family Medicine

## 2024-05-12 VITALS — BP 122/60 | HR 70 | Temp 97.6°F | Ht 63.0 in | Wt 172.0 lb

## 2024-05-12 DIAGNOSIS — E1122 Type 2 diabetes mellitus with diabetic chronic kidney disease: Secondary | ICD-10-CM

## 2024-05-12 DIAGNOSIS — E876 Hypokalemia: Principal | ICD-10-CM | POA: Diagnosis present

## 2024-05-12 DIAGNOSIS — R011 Cardiac murmur, unspecified: Secondary | ICD-10-CM

## 2024-05-12 DIAGNOSIS — N2589 Other disorders resulting from impaired renal tubular function: Secondary | ICD-10-CM | POA: Diagnosis not present

## 2024-05-12 DIAGNOSIS — Z7951 Long term (current) use of inhaled steroids: Secondary | ICD-10-CM

## 2024-05-12 DIAGNOSIS — Z79899 Other long term (current) drug therapy: Secondary | ICD-10-CM

## 2024-05-12 DIAGNOSIS — E119 Type 2 diabetes mellitus without complications: Secondary | ICD-10-CM | POA: Diagnosis not present

## 2024-05-12 DIAGNOSIS — Z8249 Family history of ischemic heart disease and other diseases of the circulatory system: Secondary | ICD-10-CM

## 2024-05-12 DIAGNOSIS — Z885 Allergy status to narcotic agent status: Secondary | ICD-10-CM

## 2024-05-12 DIAGNOSIS — E039 Hypothyroidism, unspecified: Secondary | ICD-10-CM | POA: Diagnosis present

## 2024-05-12 DIAGNOSIS — N1832 Chronic kidney disease, stage 3b: Secondary | ICD-10-CM | POA: Diagnosis present

## 2024-05-12 DIAGNOSIS — Z886 Allergy status to analgesic agent status: Secondary | ICD-10-CM

## 2024-05-12 DIAGNOSIS — K219 Gastro-esophageal reflux disease without esophagitis: Secondary | ICD-10-CM | POA: Diagnosis present

## 2024-05-12 DIAGNOSIS — Z7984 Long term (current) use of oral hypoglycemic drugs: Secondary | ICD-10-CM

## 2024-05-12 DIAGNOSIS — J4540 Moderate persistent asthma, uncomplicated: Secondary | ICD-10-CM | POA: Diagnosis present

## 2024-05-12 DIAGNOSIS — Z888 Allergy status to other drugs, medicaments and biological substances status: Secondary | ICD-10-CM

## 2024-05-12 DIAGNOSIS — Z90710 Acquired absence of both cervix and uterus: Secondary | ICD-10-CM

## 2024-05-12 DIAGNOSIS — D649 Anemia, unspecified: Secondary | ICD-10-CM | POA: Diagnosis not present

## 2024-05-12 DIAGNOSIS — R531 Weakness: Secondary | ICD-10-CM

## 2024-05-12 DIAGNOSIS — I129 Hypertensive chronic kidney disease with stage 1 through stage 4 chronic kidney disease, or unspecified chronic kidney disease: Secondary | ICD-10-CM | POA: Diagnosis present

## 2024-05-12 DIAGNOSIS — N179 Acute kidney failure, unspecified: Secondary | ICD-10-CM | POA: Diagnosis not present

## 2024-05-12 DIAGNOSIS — E785 Hyperlipidemia, unspecified: Secondary | ICD-10-CM | POA: Diagnosis present

## 2024-05-12 DIAGNOSIS — E872 Acidosis, unspecified: Secondary | ICD-10-CM | POA: Diagnosis present

## 2024-05-12 DIAGNOSIS — R5383 Other fatigue: Secondary | ICD-10-CM | POA: Diagnosis not present

## 2024-05-12 DIAGNOSIS — Z833 Family history of diabetes mellitus: Secondary | ICD-10-CM

## 2024-05-12 DIAGNOSIS — E878 Other disorders of electrolyte and fluid balance, not elsewhere classified: Secondary | ICD-10-CM | POA: Diagnosis present

## 2024-05-12 DIAGNOSIS — Z8679 Personal history of other diseases of the circulatory system: Secondary | ICD-10-CM

## 2024-05-12 DIAGNOSIS — Z96653 Presence of artificial knee joint, bilateral: Secondary | ICD-10-CM | POA: Diagnosis present

## 2024-05-12 DIAGNOSIS — R197 Diarrhea, unspecified: Secondary | ICD-10-CM | POA: Diagnosis present

## 2024-05-12 LAB — COMPREHENSIVE METABOLIC PANEL WITH GFR
ALT: 13 U/L (ref 0–35)
AST: 16 U/L (ref 0–37)
Albumin: 2.9 g/dL — ABNORMAL LOW (ref 3.5–5.2)
Alkaline Phosphatase: 81 U/L (ref 39–117)
BUN: 12 mg/dL (ref 6–23)
CO2: 17 meq/L — ABNORMAL LOW (ref 19–32)
Calcium: 7.7 mg/dL — ABNORMAL LOW (ref 8.4–10.5)
Chloride: 112 meq/L (ref 96–112)
Creatinine, Ser: 1.88 mg/dL — ABNORMAL HIGH (ref 0.40–1.20)
GFR: 26.42 mL/min — ABNORMAL LOW (ref >60.00)
Glucose, Bld: 105 mg/dL — ABNORMAL HIGH (ref 70–99)
Potassium: 2.3 meq/L — CL (ref 3.5–5.1)
Sodium: 142 meq/L (ref 135–145)
Total Bilirubin: 0.3 mg/dL (ref 0.2–1.2)
Total Protein: 6 g/dL (ref 6.0–8.3)

## 2024-05-12 LAB — CBC
HCT: 31.9 % — ABNORMAL LOW (ref 36.0–46.0)
Hemoglobin: 10.1 g/dL — ABNORMAL LOW (ref 12.0–15.0)
MCH: 26.9 pg (ref 26.0–34.0)
MCHC: 31.7 g/dL (ref 30.0–36.0)
MCV: 85.1 fL (ref 80.0–100.0)
Platelets: 495 K/uL — ABNORMAL HIGH (ref 150–400)
RBC: 3.75 MIL/uL — ABNORMAL LOW (ref 3.87–5.11)
RDW: 19.1 % — ABNORMAL HIGH (ref 11.5–15.5)
WBC: 10 K/uL (ref 4.0–10.5)
nRBC: 0 % (ref 0.0–0.2)

## 2024-05-12 LAB — CBC WITH DIFFERENTIAL/PLATELET
Basophils Absolute: 0 K/uL (ref 0.0–0.1)
Basophils Relative: 0.2 % (ref 0.0–3.0)
Eosinophils Absolute: 0 K/uL (ref 0.0–0.7)
Eosinophils Relative: 0.1 % (ref 0.0–5.0)
HCT: 30.7 % — ABNORMAL LOW (ref 36.0–46.0)
Hemoglobin: 10 g/dL — ABNORMAL LOW (ref 12.0–15.0)
Lymphocytes Relative: 15.8 % (ref 12.0–46.0)
Lymphs Abs: 1.5 K/uL (ref 0.7–4.0)
MCHC: 32.5 g/dL (ref 30.0–36.0)
MCV: 82.3 fl (ref 78.0–100.0)
Monocytes Absolute: 0.7 K/uL (ref 0.1–1.0)
Monocytes Relative: 7.3 % (ref 3.0–12.0)
Neutro Abs: 7.4 K/uL (ref 1.4–7.7)
Neutrophils Relative %: 76.6 % (ref 43.0–77.0)
Platelets: 443 K/uL — ABNORMAL HIGH (ref 150.0–400.0)
RBC: 3.73 Mil/uL — ABNORMAL LOW (ref 3.87–5.11)
RDW: 18.5 % — ABNORMAL HIGH (ref 11.5–15.5)
WBC: 9.7 K/uL (ref 4.0–10.5)

## 2024-05-12 LAB — BASIC METABOLIC PANEL WITH GFR
Anion gap: 15 (ref 5–15)
BUN: 12 mg/dL (ref 8–23)
CO2: 16 mmol/L — ABNORMAL LOW (ref 22–32)
Calcium: 7.9 mg/dL — ABNORMAL LOW (ref 8.9–10.3)
Chloride: 112 mmol/L — ABNORMAL HIGH (ref 98–111)
Creatinine, Ser: 1.84 mg/dL — ABNORMAL HIGH (ref 0.44–1.00)
GFR, Estimated: 29 mL/min — ABNORMAL LOW (ref >60)
Glucose, Bld: 98 mg/dL (ref 70–99)
Potassium: 2.7 mmol/L — CL (ref 3.5–5.1)
Sodium: 143 mmol/L (ref 135–145)

## 2024-05-12 LAB — VITAMIN B12: Vitamin B-12: >1500 pg/mL — ABNORMAL HIGH (ref 211–911)

## 2024-05-12 LAB — MAGNESIUM: Magnesium: 1.3 mg/dL — ABNORMAL LOW (ref 1.7–2.4)

## 2024-05-12 LAB — IRON,TIBC AND FERRITIN PANEL
%SAT: 30 % (ref 16–45)
Ferritin: 109 ng/mL (ref 16–288)
Iron: 67 ug/dL (ref 45–160)
TIBC: 221 ug/dL — ABNORMAL LOW (ref 250–450)

## 2024-05-12 LAB — TSH: TSH: 2.13 u[IU]/mL (ref 0.35–5.50)

## 2024-05-12 LAB — VITAMIN D 25 HYDROXY (VIT D DEFICIENCY, FRACTURES): VITD: 58.89 ng/mL (ref 30.00–100.00)

## 2024-05-12 MED ORDER — MELATONIN 3 MG PO TABS
3.0000 mg | ORAL_TABLET | Freq: Every evening | ORAL | Status: DC | PRN
  Administered 2024-05-13: 3 mg via ORAL
  Filled 2024-05-12: qty 1

## 2024-05-12 MED ORDER — ONDANSETRON HCL 4 MG/2ML IJ SOLN: 4.0000 mg | Freq: Four times a day (QID) | INTRAMUSCULAR | Status: DC | PRN

## 2024-05-12 MED ORDER — MAGNESIUM SULFATE 4 GM/100ML IV SOLN
4.0000 g | Freq: Once | INTRAVENOUS | Status: AC
  Administered 2024-05-12: 4 g via INTRAVENOUS
  Filled 2024-05-12: qty 100

## 2024-05-12 MED ORDER — POTASSIUM CHLORIDE CRYS ER 20 MEQ PO TBCR
40.0000 meq | EXTENDED_RELEASE_TABLET | Freq: Once | ORAL | Status: AC
  Administered 2024-05-12: 40 meq via ORAL
  Filled 2024-05-12: qty 2

## 2024-05-12 MED ORDER — CYANOCOBALAMIN 1000 MCG/ML IJ SOLN
1000.0000 ug | Freq: Once | INTRAMUSCULAR | Status: AC
  Administered 2024-05-12: 1000 ug via INTRAMUSCULAR

## 2024-05-12 MED ORDER — POTASSIUM CHLORIDE 10 MEQ/100ML IV SOLN
10.0000 meq | Freq: Once | INTRAVENOUS | Status: AC
  Administered 2024-05-12: 10 meq via INTRAVENOUS
  Filled 2024-05-12: qty 100

## 2024-05-12 NOTE — Patient Instructions (Addendum)
 I am so glad you are out of the hospital.  Lets continue your current medication regimen for now.  I am going to recheck some labs today, if I see anything that needs any treatment will be in contact with you.  You have received a B12 injection today.  I would like for you to make sure you are staying well-hydrated, making sure that you are up and moving around the house.  Follow through with physical therapy as scheduled.  Follow-up with Dr. Norleen as scheduled, with us  sooner if needed

## 2024-05-12 NOTE — H&P (Signed)
 History and Physical      Brooke [name] FMW:[id number] DOB: 05-06-1951 DOA: 05/12/2024; DOS: 05/12/2024  PCP: Norleen Lynwood ORN, MD *** Patient coming from: home ***  I have personally briefly reviewed patient's old medical records in [facility]  Chief Complaint: ***  HPI: Brooke [name] is a 73 y.o. female with medical history significant for *** who is admitted to [facility] on 05/12/2024 with *** after presenting from home*** to [facility] ED complaining of ***.   ***        ***  ED Course:  Vital signs in the ED were notable for the following: ***  Labs were notable for the following: ***  Per my interpretation, EKG in ED demonstrated the following:  ***  Imaging in the ED, per corresponding formal radiology read, was notable for the following: ***  While in the ED, the following were administered: ***  Subsequently, the patient was admitted  ***  ***red   Review of Systems: As per HPI otherwise 10 point review of systems negative.   Past Medical History:  Diagnosis Date   Alopecia    Anxiety    Aortic atherosclerosis    Asthma    FOLLOWED BY PCP   Eczema    GERD (gastroesophageal reflux disease)    Gout    04-28-2018--- per pt stable , as been a while since last episode   Heart murmur    History of colon polyps    History of syncope 2015   Hyperlipidemia    Hypertension    Hypothyroidism    OA (osteoarthritis) of knee    bilateral   PVC's (premature ventricular contractions)    Toxic multinodular goiter    03/ 2003  s/p RAI   Type 2 diabetes mellitus (HCC)    followed by pcp   Ventricular tachycardia, polymorphic (HCC) 01/04/2014   primary cardiologist-- dr wilbert turner (hx monitor 2015 showed couplet PVCs, as trigger)   Wears glasses    Wears partial dentures    upper    Past Surgical History:  Procedure Laterality Date   ABDOMINAL HYSTERECTOMY  10/22/1999   WITH BSO   CATARACT EXTRACTION W/ INTRAOCULAR LENS IMPLANT Left YRS AGO    COLONOSCOPY     EXCISION ABDOMINAL WALL MASS  12-20-2005   dr merrilyn @[facility]    neruofibroma   LAPAROSCOPIC CHOLECYSTECTOMY  10/01/2002   dr merrilyn @[facility]    LEFT HEART CATHETERIZATION WITH CORONARY ANGIOGRAM N/A 01/07/2014   Procedure: LEFT HEART CATHETERIZATION WITH CORONARY ANGIOGRAM;  Surgeon: Peter M Swaziland, MD;  Location: [facility] CATH LAB;  Service: Cardiovascular;  Laterality: N/A;   RASTELLI PROCEDURE  6/98 neg   RENAL ARTERY STENT Left 11/2003   angioplasty and stenting   RENAL ARTERY STENT Left 02/2005   re-stenting   TOTAL KNEE ARTHROPLASTY Right 05/05/2018   Procedure: RIGHT TOTAL KNEE ARTHROPLASTY;  Surgeon: Liam Lerner, MD;  Location: [facility] ORS;  Service: Orthopedics;  Laterality: Right;   TOTAL KNEE ARTHROPLASTY Left 09/30/2018   Procedure: TOTAL KNEE ARTHROPLASTY;  Surgeon: Sheril Coy, MD;  Location: [facility] ORS;  Service: Orthopedics;  Laterality: Left;    Social History:  reports that she has never smoked. She has never been exposed to tobacco smoke. She has never used smokeless tobacco. She reports that she does not drink alcohol and does not use drugs.   Allergies  Allergen Reactions   Gnp Glp-1 Daily Support [Germanium] Other (See Comments)    Ileus requiring hospitalization   Metformin   And Related Nausea And Vomiting   Ace Inhibitors Swelling and Other (See Comments)    Ankles swell   Codeine Nausea And Vomiting and Rash   Hydrocodone  Nausea And Vomiting   Tramadol  Nausea And Vomiting   Tylenol  [Acetaminophen ] Itching, Rash and Other (See Comments)    Allergic to prescription strength Tylenol . The patient is taking 500 mg Tylenol  in 2025, however.    Family History  Problem Relation Age of Onset   Diabetes Mother    Heart disease Father    Colon cancer Neg Hx    Esophageal cancer Neg Hx    Rectal cancer Neg Hx    Stomach cancer Neg Hx    BRCA 1/2 Neg Hx    Breast cancer Neg Hx     Family history reviewed and not pertinent ***   Prior to Admission  medications   Medication Sig Start Date End Date Taking? Authorizing Provider  albuterol  (VENTOLIN  HFA) 108 (90 Base) MCG/ACT inhaler Inhale 2 puffs into the lungs every 4 (four) hours as needed for wheezing or shortness of breath. 03/05/24   Jeneal Danita Macintosh, MD  allopurinol  (ZYLOPRIM ) 100 MG tablet TAKE 1 TABLET BY MOUTH EVERY DAY 05/06/24   Norleen Lynwood ORN, MD  atropine 1 % ophthalmic solution Place 1 drop into the left eye daily. 01/16/24   [provider]  Azelastine  HCl 137 MCG/SPRAY SOLN PLACE 2 SPRAYS INTO BOTH NOSTRILS 2 (TWO) TIMES DAILY. USE IN EACH NOSTRIL AS DIRECTED Patient taking differently: Place 2 sprays into the nose in the morning and at bedtime. 04/03/24   Norleen Lynwood ORN, MD  brimonidine  (ALPHAGAN ) 0.2 % ophthalmic solution 3 (three) times daily. 1 drop in the left eye three times daily Patient taking differently: Place 1 drop into the left eye in the morning and at bedtime.    [provider]  cetirizine  (ZYRTEC ) 10 MG tablet TAKE 1 TABLET BY MOUTH EVERY DAY 05/05/24   Jeneal Danita Macintosh, MD  citalopram  (CELEXA ) 40 MG tablet TAKE 1 TABLET BY MOUTH EVERY DAY 03/06/24   Norleen Lynwood ORN, MD  clonazePAM  (KLONOPIN ) 0.5 MG tablet Take 1 tablet (0.5 mg total) by mouth daily as needed (vertigo). 02/25/23   Norleen Lynwood ORN, MD  colchicine  0.6 MG tablet TAKE 0.5 TABLETS (0.3 MG TOTAL) BY MOUTH DAILY AS NEEDED (GOUT OR PSUEDOGOUT PAIN). 10/01/22   Joane Artist RAMAN, MD  diltiazem  (CARDIZEM  CD) 240 MG 24 hr capsule Take 1 capsule (240 mg total) by mouth daily. 10/02/23   Norleen Lynwood ORN, MD  dorzolamide  (TRUSOPT ) 2 % ophthalmic solution Place 1 drop into the left eye 3 (three) times daily.    [provider]  famotidine  (PEPCID ) 20 MG tablet Take 1 tablet (20 mg total) by mouth 2 (two) times daily. 03/05/24   Jeneal Danita Macintosh, MD  Fluticasone -Umeclidin-Vilant (TRELEGY ELLIPTA ) 200-62.5-25 MCG/ACT AEPB Inhale 1 puff into the lungs daily. 03/05/24   Jeneal Danita Macintosh, MD  hydrALAZINE  (APRESOLINE ) 25 MG tablet TAKE 1 TABLET BY MOUTH THREE TIMES A DAY 05/06/24   Norleen Lynwood ORN, MD  JARDIANCE  25 MG TABS tablet TAKE 1 TABLET BY MOUTH EVERY DAY BEFORE BREAKFAST Patient taking differently: Take 25 mg by mouth daily before breakfast. 11/26/23   Norleen Lynwood ORN, MD  latanoprost  (XALATAN ) 0.005 % ophthalmic solution Place 1 drop into the left eye at bedtime.    [provider]  levocetirizine (XYZAL ) 5 MG tablet Take 1 tablet (5 mg total) by mouth daily. 03/05/24  Jeneal Danita Macintosh, MD  meclizine  (ANTIVERT ) 25 MG tablet TAKE 1 TABLET BY MOUTH 3 TIMES A DAY AS NEEDED FOR DIZZINESS Patient taking differently: Take 25 mg by mouth 3 (three) times daily as needed for dizziness. 11/04/23   Norleen Lynwood ORN, MD  metoprolol  succinate (TOPROL -XL) 100 MG 24 hr tablet TAKE 1 TABLET BY MOUTH 2 (TWO) TIMES DAILY. TAKE WITH OR IMMEDIATELY FOLLOWING A MEAL. Patient taking differently: Take 100 mg by mouth in the morning and at bedtime. 04/03/24   Shlomo Wilbert SAUNDERS, MD  omeprazole  (PRILOSEC) 40 MG capsule Take 1 capsule (40 mg total) by mouth daily. Patient taking differently: Take 40 mg by mouth See admin instructions. Take 40 mg by mouth 30 minutes prior to breakfast and supper 12/05/21   Norleen Lynwood ORN, MD  ondansetron  (ZOFRAN -ODT) 4 MG disintegrating tablet Take 1 tablet (4 mg total) by mouth every 8 (eight) hours as needed for nausea or vomiting. Patient taking differently: Take 4 mg by mouth every 8 (eight) hours as needed for nausea or vomiting (dissolve orally). 04/16/24   Rai, Ripudeep K, MD  potassium chloride  (KLOR-CON ) 20 MEQ packet Take 20 mEq by mouth daily. 05/05/24   Barbarann Nest, MD  rosuvastatin  (CRESTOR ) 40 MG tablet TAKE 1 TABLET BY MOUTH EVERY DAY 04/22/24   Shlomo Wilbert SAUNDERS, MD  sodium bicarbonate  650 MG tablet Take 1 tablet (650 mg total) by mouth 2 (two) times daily. 05/05/24   Barbarann Nest, MD  traZODone  (DESYREL ) 100 MG tablet TAKE 1  TABLET (100 MG TOTAL) BY MOUTH AT BEDTIME AS NEEDED. FOR SLEEP Patient taking differently: Take 100 mg by mouth at bedtime. 03/03/24   Norleen Lynwood ORN, MD  TYLENOL  500 MG tablet Take 1,000 mg by mouth every 8 (eight) hours as needed for mild pain (pain score 1-3) (or headaches).    [provider]     Objective    Physical Exam: Vitals:   05/12/24 1722 05/12/24 1815 05/12/24 1930 05/12/24 2030  BP:   135/68 103/66  Pulse:  65 72 62  Resp:  19 14 17   Temp:      TempSrc:      SpO2:  100% 97% 94%  Weight: 78.5 kg     Height: 5' 3 (1.6 m)       General: appears to be stated age; alert, oriented Skin: warm, dry, no rash Head:  AT/[place] Mouth:  Oral mucosa membranes appear moist, normal dentition Neck: supple; trachea midline Heart:  RRR; did not appreciate any M/R/G Lungs: CTAB, did not appreciate any wheezes, rales, or rhonchi Abdomen: + BS; soft, ND, NT Vascular: 2+ pedal pulses b/l; 2+ radial pulses b/l Extremities: no peripheral edema, no muscle wasting   *** Neuro: strength and sensation intact in upper and lower extremities b/l  *** Neuro: 5/5 strength of the proximal and distal flexors and extensors of the upper and lower extremities bilaterally; sensation intact in upper and lower extremities b/l; cranial nerves II through XII grossly intact; no pronator drift; no evidence suggestive of slurred speech, dysarthria, or facial droop; Normal muscle tone. No tremors. *** Neuro: In the setting of the patient's current mental status and associated inability to follow instructions, unable to perform full neurologic exam at this time.  As such, assessment of strength, sensation, and cranial nerves is limited at this time. Patient noted to spontaneously move all 4 extremities. No tremors.  ***       Labs on Admission: I have personally reviewed following labs  and imaging studies  CBC: Recent Labs  Lab 05/12/24 0938 05/12/24 1746  WBC 9.7 10.0  NEUTROABS 7.4  --    HGB 10.0* 10.1*  HCT 30.7* 31.9*  MCV 82.3 85.1  PLT 443.0* 495*   Basic Metabolic Panel: Recent Labs  Lab 05/12/24 0938 05/12/24 1746 05/12/24 1808  NA 142 143  --   K 2.3* 2.7*  --   CL 112 112*  --   CO2 17* 16*  --   GLUCOSE 105* 98  --   BUN 12 12  --   CREATININE 1.88* 1.84*  --   CALCIUM  7.7* 7.9*  --   MG  --   --  1.3*   GFR: Estimated Creatinine Clearance: 27.4 mL/min (A) (by C-G formula based on SCr of 1.84 mg/dL (H)). Liver Function Tests: Recent Labs  Lab 05/12/24 0938  AST 16  ALT 13  ALKPHOS 81  BILITOT 0.3  PROT 6.0  ALBUMIN 2.9*   No results for input(s): LIPASE, AMYLASE in the last 168 hours. No results for input(s): AMMONIA in the last 168 hours. Coagulation Profile: No results for input(s): INR, PROTIME in the last 168 hours. Cardiac Enzymes: No results for input(s): CKTOTAL, CKMB, CKMBINDEX, TROPONINI in the last 168 hours. BNP (last 3 results) No results for input(s): PROBNP in the last 8760 hours. HbA1C: No results for input(s): HGBA1C in the last 72 hours. CBG: No results for input(s): GLUCAP in the last 168 hours. Lipid Profile: No results for input(s): CHOL, HDL, LDLCALC, TRIG, CHOLHDL, LDLDIRECT in the last 72 hours. Thyroid  Function Tests: Recent Labs    05/12/24 0938  TSH 2.13   Anemia Panel: Recent Labs    05/12/24 0938  VITAMINB12 >1500*   Urine analysis:    Component Value Date/Time   COLORURINE YELLOW 04/28/2024 0733   APPEARANCEUR HAZY (A) 04/28/2024 0733   LABSPEC 1.027 04/28/2024 0733   PHURINE 5.0 04/28/2024 0733   GLUCOSEU >=500 (A) 04/28/2024 0733   GLUCOSEU 500 (A) 09/06/2022 1011   HGBUR NEGATIVE 04/28/2024 0733   BILIRUBINUR NEGATIVE 04/28/2024 0733   BILIRUBINUR neg 09/12/2016 0954   KETONESUR 5 (A) 04/28/2024 0733   PROTEINUR >=300 (A) 04/28/2024 0733   UROBILINOGEN 0.2 09/06/2022 1011   NITRITE NEGATIVE 04/28/2024 0733   LEUKOCYTESUR NEGATIVE 04/28/2024 0733     Radiological Exams on Admission: No results found.    Assessment/Plan    Principal Problem:   Hypokalemia  ***        ***                  ***                  ***                    ***                    ***                   ***                    ***                    ***                    ***                    ***                    ***                    ***                   ***  DVT prophylaxis: SCD's ***  Code Status: Full code*** Family Communication: none*** Disposition Plan: Per Rounding Team Consults called: none***;  Admission status: ***    I SPENT GREATER THAN 75 *** MINUTES IN CLINICAL CARE TIME/MEDICAL DECISION-MAKING IN COMPLETING THIS ADMISSION.     Eva NOVAK Catlin Aycock DO [facility] From 7PM - 7AM   05/12/2024, 9:38 PM   ***

## 2024-05-12 NOTE — ED Provider Notes (Signed)
 [place] EMERGENCY DEPARTMENT AT [facility] Provider Note   CSN: [id number] Arrival date & time: 05/12/24  1713     Patient presents with: Abnormal Lab   LATISH TOUTANT is a 73 y.o. female presents to the ED today for abnormal lab values, was seen at primary care for fatigue this morning and BMP there shows a potassium of 2.3.  Was advised by primary care to arrive to the emergency department for intravenous potassium.  She states that she has had persistent fatigue since discharge from the hospital, has a history of stage III CKD, also has previous diagnosis of type 2 diabetes.  Review of discharge summary from 14 October shows that she was primarily admitted for an ileus, but is also managed for CKD, COPD, essential hypertension, as well as hyponatremia and GERD.  She did have an echocardiogram and 29 April 2024 with findings that were concerning at this time for left ventricular outflow obstruction.  EF is 70 to 75% with hyperdynamic left ventricular function.  Noted mild LVH.  No valvular deficiencies appreciated.    Abnormal Lab      Prior to Admission medications   Medication Sig Start Date End Date Taking? Authorizing Provider  albuterol  (VENTOLIN  HFA) 108 (90 Base) MCG/ACT inhaler Inhale 2 puffs into the lungs every 4 (four) hours as needed for wheezing or shortness of breath. 03/05/24   Jeneal Danita Macintosh, MD  allopurinol  (ZYLOPRIM ) 100 MG tablet TAKE 1 TABLET BY MOUTH EVERY DAY 05/06/24   Norleen Lynwood ORN, MD  atropine 1 % ophthalmic solution Place 1 drop into the left eye daily. 01/16/24   [provider]  Azelastine  HCl 137 MCG/SPRAY SOLN PLACE 2 SPRAYS INTO BOTH NOSTRILS 2 (TWO) TIMES DAILY. USE IN EACH NOSTRIL AS DIRECTED Patient taking differently: Place 2 sprays into the nose in the morning and at bedtime. 04/03/24   Norleen Lynwood ORN, MD  brimonidine  (ALPHAGAN ) 0.2 % ophthalmic solution 3 (three) times daily. 1 drop in the left eye three times  daily Patient taking differently: Place 1 drop into the left eye in the morning and at bedtime.    [provider]  cetirizine  (ZYRTEC ) 10 MG tablet TAKE 1 TABLET BY MOUTH EVERY DAY 05/05/24   Jeneal Danita Macintosh, MD  citalopram  (CELEXA ) 40 MG tablet TAKE 1 TABLET BY MOUTH EVERY DAY 03/06/24   Norleen Lynwood ORN, MD  clonazePAM  (KLONOPIN ) 0.5 MG tablet Take 1 tablet (0.5 mg total) by mouth daily as needed (vertigo). 02/25/23   Norleen Lynwood ORN, MD  colchicine  0.6 MG tablet TAKE 0.5 TABLETS (0.3 MG TOTAL) BY MOUTH DAILY AS NEEDED (GOUT OR PSUEDOGOUT PAIN). 10/01/22   Corey, Evan S, MD  diltiazem  (CARDIZEM  CD) 240 MG 24 hr capsule Take 1 capsule (240 mg total) by mouth daily. 10/02/23   Norleen Lynwood ORN, MD  dorzolamide  (TRUSOPT ) 2 % ophthalmic solution Place 1 drop into the left eye 3 (three) times daily.    [provider]  famotidine  (PEPCID ) 20 MG tablet Take 1 tablet (20 mg total) by mouth 2 (two) times daily. 03/05/24   Jeneal Danita Macintosh, MD  Fluticasone -Umeclidin-Vilant (TRELEGY ELLIPTA ) 200-62.5-25 MCG/ACT AEPB Inhale 1 puff into the lungs daily. 03/05/24   Jeneal Danita Macintosh, MD  hydrALAZINE  (APRESOLINE ) 25 MG tablet TAKE 1 TABLET BY MOUTH THREE TIMES A DAY 05/06/24   Norleen Lynwood ORN, MD  JARDIANCE  25 MG TABS tablet TAKE 1 TABLET BY MOUTH EVERY DAY BEFORE BREAKFAST Patient taking differently: Take 25  mg by mouth daily before breakfast. 11/26/23   Norleen Lynwood ORN, MD  latanoprost  (XALATAN ) 0.005 % ophthalmic solution Place 1 drop into the left eye at bedtime.    [provider]  levocetirizine (XYZAL ) 5 MG tablet Take 1 tablet (5 mg total) by mouth daily. 03/05/24   Jeneal Danita Macintosh, MD  meclizine  (ANTIVERT ) 25 MG tablet TAKE 1 TABLET BY MOUTH 3 TIMES A DAY AS NEEDED FOR DIZZINESS Patient taking differently: Take 25 mg by mouth 3 (three) times daily as needed for dizziness. 11/04/23   Norleen Lynwood ORN, MD  metoprolol  succinate (TOPROL -XL) 100 MG 24 hr tablet TAKE 1  TABLET BY MOUTH 2 (TWO) TIMES DAILY. TAKE WITH OR IMMEDIATELY FOLLOWING A MEAL. Patient taking differently: Take 100 mg by mouth in the morning and at bedtime. 04/03/24   Shlomo Wilbert SAUNDERS, MD  omeprazole  (PRILOSEC) 40 MG capsule Take 1 capsule (40 mg total) by mouth daily. Patient taking differently: Take 40 mg by mouth See admin instructions. Take 40 mg by mouth 30 minutes prior to breakfast and supper 12/05/21   Norleen Lynwood ORN, MD  ondansetron  (ZOFRAN -ODT) 4 MG disintegrating tablet Take 1 tablet (4 mg total) by mouth every 8 (eight) hours as needed for nausea or vomiting. Patient taking differently: Take 4 mg by mouth every 8 (eight) hours as needed for nausea or vomiting (dissolve orally). 04/16/24   Rai, Ripudeep K, MD  potassium chloride  (KLOR-CON ) 20 MEQ packet Take 20 mEq by mouth daily. 05/05/24   Barbarann Nest, MD  rosuvastatin  (CRESTOR ) 40 MG tablet TAKE 1 TABLET BY MOUTH EVERY DAY 04/22/24   Shlomo Wilbert SAUNDERS, MD  sodium bicarbonate  650 MG tablet Take 1 tablet (650 mg total) by mouth 2 (two) times daily. 05/05/24   Barbarann Nest, MD  traZODone  (DESYREL ) 100 MG tablet TAKE 1 TABLET (100 MG TOTAL) BY MOUTH AT BEDTIME AS NEEDED. FOR SLEEP Patient taking differently: Take 100 mg by mouth at bedtime. 03/03/24   Norleen Lynwood ORN, MD  TYLENOL  500 MG tablet Take 1,000 mg by mouth every 8 (eight) hours as needed for mild pain (pain score 1-3) (or headaches).    [provider]    Allergies: Gnp glp-1 daily support [germanium], Metformin  and related, Ace inhibitors, Codeine, Hydrocodone , Tramadol , and Tylenol  [acetaminophen ]    Review of Systems  Constitutional:  Positive for fatigue.  All other systems reviewed and are negative.   Updated Vital Signs BP 103/66   Pulse 62   Temp 97.9 F (36.6 C) (Oral)   Resp 17   Ht 5' 3 (1.6 m)   Wt 78.5 kg   SpO2 94%   BMI 30.65 kg/m   Physical Exam Vitals and nursing note reviewed.  Constitutional:      General: She is awake. She is not in  acute distress.    Appearance: Normal appearance.  HENT:     Head: Normocephalic and atraumatic.     Mouth/Throat:     Mouth: Mucous membranes are moist.     Pharynx: Oropharynx is clear.  Eyes:     Extraocular Movements: Extraocular movements intact.     Conjunctiva/sclera: Conjunctivae normal.     Pupils: Pupils are equal, round, and reactive to light.  Cardiovascular:     Rate and Rhythm: Normal rate and regular rhythm.     Pulses: Normal pulses.     Heart sounds: S1 normal and S2 normal. Murmur heard.     Systolic murmur is present with a grade of 1/6.  No friction rub. No gallop.  Pulmonary:     Effort: Pulmonary effort is normal.     Breath sounds: Normal breath sounds.  Abdominal:     General: Abdomen is flat. Bowel sounds are normal.     Palpations: Abdomen is soft.  Musculoskeletal:        General: Normal range of motion.     Cervical back: Normal range of motion and neck supple.     Right lower leg: No edema.     Left lower leg: No edema.  Skin:    General: Skin is warm and dry.     Capillary Refill: Capillary refill takes less than 2 seconds.  Neurological:     General: No focal deficit present.     Mental Status: She is alert and oriented to person, place, and time. Mental status is at baseline.     GCS: GCS eye subscore is 4. GCS verbal subscore is 5. GCS motor subscore is 6.  Psychiatric:        Mood and Affect: Mood normal.        Behavior: Behavior is cooperative.     (all labs ordered are listed, but only abnormal results are displayed) Labs Reviewed  CBC - Abnormal; Notable for the following components:      Result Value   RBC 3.75 (*)    Hemoglobin 10.1 (*)    HCT 31.9 (*)    RDW 19.1 (*)    Platelets 495 (*)    All other components within normal limits  BASIC METABOLIC PANEL WITH GFR - Abnormal; Notable for the following components:   Potassium 2.7 (*)    Chloride 112 (*)    CO2 16 (*)    Creatinine, Ser 1.84 (*)    Calcium  7.9 (*)     GFR, Estimated 29 (*)    All other components within normal limits  MAGNESIUM  - Abnormal; Notable for the following components:   Magnesium  1.3 (*)    All other components within normal limits  CBC WITH DIFFERENTIAL/PLATELET  COMPREHENSIVE METABOLIC PANEL WITH GFR  MAGNESIUM   VITAMIN B12  TSH    EKG: EKG Interpretation Date/Time:  Tuesday May 12 2024 18:52:45 EDT Ventricular Rate:  75 PR Interval:  162 QRS Duration:  132 QT Interval:  599 QTC Calculation: 670 R Axis:   -49  Text Interpretation: Sinus rhythm LVH with IVCD, LAD and secondary repol abnrm Prolonged QT interval Confirmed by Freddi Hamilton [phone]) on 05/12/2024 7:01:26 PM  Radiology: No results found.   Procedures   Medications Ordered in the ED  melatonin tablet 3 mg (has no administration in time range)  ondansetron  (ZOFRAN ) injection 4 mg (has no administration in time range)  potassium chloride  SA (KLOR-CON  M) CR tablet 40 mEq (has no administration in time range)  potassium chloride  10 mEq in 100 mL IVPB (0 mEq Intravenous Stopped 05/12/24 1917)  potassium chloride  SA (KLOR-CON  M) CR tablet 40 mEq (40 mEq Oral Given 05/12/24 1853)  magnesium  sulfate IVPB 4 g 100 mL (0 g Intravenous Stopped 05/12/24 2134)                                    Medical Decision Making Amount and/or Complexity of Data Reviewed Labs: ordered.  Risk Prescription drug management. Decision regarding hospitalization.   Medical Decision Making:   BHAVANA KADY is a 73 y.o. female who presented to the ED  today with abnormal labs detailed above.    External chart has been reviewed including previous labs and imaging. Patient's presentation is complicated by their history of CKD.  Patient placed on continuous vitals and telemetry monitoring while in ED which was reviewed periodically.  Complete initial physical exam performed, notably the patient  was alert and oriented in no apparent distress.  She complains of fatigue  otherwise has no objective findings on exam..    Reviewed and confirmed nursing documentation for past medical history, family history, social history.    Initial Assessment:   With the patient's presentation of abnormal labs along with fatigue, consider hypoglycemia along with electrolyte abnormalities as noted on previous exam at primary care.  Further consider cardiac causes of fatigue, cardiorenal syndrome.   Initial Plan:   Screening labs including CBC and Metabolic panel to evaluate for infectious or metabolic etiology of disease.  Add serum magnesium  EKG to evaluate for cardiac pathology Objective evaluation as below reviewed   Initial Study Results:   Laboratory  All laboratory results reviewed without evidence of clinically relevant pathology.   Exceptions include: Profound hypokalemia at 2.7, creatinine is elevated 1.84 which appears to be baseline for this patient.  Hemoglobin is 10.1 which again appears to be baseline for this patient.  EKG EKG was reviewed independently. Rate, rhythm, axis, intervals all examined and without medically relevant abnormality. ST segments without concerns for elevations.         Consults: Case discussed with Dr. Marcene with hospitalist team.   Reassessment and Plan:   After assessment of the patient's potassium levels noted profound hypokalemia, have been giving IV potassium along with IV magnesium .  Because of the profound hypokalemia will have patient admitted for continuous repletion of IV potassium as well as for magnesium  repletion.  Evaluation for losses.  Continue cardiac monitoring.  Discussed patient with hospitalist team who accepts to admit this patient for the same.       Final diagnoses:  Hypokalemia  Hypomagnesemia    ED Discharge Orders     None          Myriam Dorn BROCKS, [place] 05/12/24 2204    Randol Simmonds, MD 05/13/24 [phone]

## 2024-05-12 NOTE — Progress Notes (Signed)
 Acute Office Visit  Subjective:     Patient ID: Brooke [name], female    DOB: 02-Nov-1950, 73 y.o.   MRN: [id number]  Chief Complaint  Patient presents with   Hospitalization Follow-up    HPI  Discussed the use of AI scribe software for clinical note transcription with the patient, who gave verbal consent to proceed.  HFU 04/27/24-05/05/24 for admission for ileus, AKI, COVID. TOC call 05/08/24 History of Present Illness Brooke [name] is a 73 year old female with diabetes who presents with fatigue and tiredness.  Fatigue and decreased energy - Fatigue and insufficient energy for daily activities - Low hemoglobin levels, which may contribute to fatigue  Glycemic control - Diabetes managed with Jardiance  25 mg - A1c of 6.2 in August - Mounjaro  discontinued during a recent hospital stay  Electrolyte and acid-base management - Potassium dosage increased from 10 to 20 (units not specified) - Takes sodium bicarbonate  650 mg  Gastrointestinal symptoms - Stomach condition has improved - No current gastrointestinal issues  Vitamin supplementation - Does not take B12 supplements     ROS Per HPI      Objective:    BP 122/60 (BP Location: Left Arm, Patient Position: Sitting)   Pulse 70   Temp 97.6 F (36.4 C) (Temporal)   Ht 5' 3 (1.6 m)   Wt 172 lb (78 kg)   SpO2 99%   BMI 30.47 kg/m    Physical Exam Vitals and nursing note reviewed.  Constitutional:      General: She is not in acute distress.    Appearance: Normal appearance. She is normal weight.  HENT:     Head: Normocephalic and atraumatic.     Right Ear: External ear normal.     Left Ear: External ear normal.     Nose: Nose normal.     Mouth/Throat:     Mouth: Mucous membranes are moist.     Pharynx: Oropharynx is clear.  Eyes:     Extraocular Movements: Extraocular movements intact.     Pupils: Pupils are equal, round, and reactive to light.  Cardiovascular:     Rate and Rhythm: Normal rate  and regular rhythm.     Pulses: Normal pulses.     Heart sounds: Normal heart sounds.  Pulmonary:     Effort: Pulmonary effort is normal. No respiratory distress.     Breath sounds: Normal breath sounds. No wheezing, rhonchi or rales.  Musculoskeletal:        General: Normal range of motion.     Cervical back: Normal range of motion.     Right lower leg: No edema.     Left lower leg: No edema.  Lymphadenopathy:     Cervical: No cervical adenopathy.  Neurological:     General: No focal deficit present.     Mental Status: She is alert and oriented to person, place, and time.  Psychiatric:        Mood and Affect: Mood normal.        Thought Content: Thought content normal.     Results for orders placed or performed in visit on 05/12/24  CBC with Differential/Platelet  Result Value Ref Range   WBC 9.7 4.0 - 10.5 K/uL   RBC 3.73 (L) 3.87 - 5.11 Mil/uL   Hemoglobin 10.0 (L) 12.0 - 15.0 g/dL   HCT 69.2 (L) 63.9 - 53.9 %   MCV 82.3 78.0 - 100.0 fl   MCHC 32.5 30.0 - 36.0 g/dL  RDW 18.5 (H) 11.5 - 15.5 %   Platelets 443.0 (H) 150.0 - 400.0 K/uL   Neutrophils Relative % 76.6 43.0 - 77.0 %   Lymphocytes Relative 15.8 12.0 - 46.0 %   Monocytes Relative 7.3 3.0 - 12.0 %   Eosinophils Relative 0.1 0.0 - 5.0 %   Basophils Relative 0.2 0.0 - 3.0 %   Neutro Abs 7.4 1.4 - 7.7 K/uL   Lymphs Abs 1.5 0.7 - 4.0 K/uL   Monocytes Absolute 0.7 0.1 - 1.0 K/uL   Eosinophils Absolute 0.0 0.0 - 0.7 K/uL   Basophils Absolute 0.0 0.0 - 0.1 K/uL  Comprehensive metabolic panel with GFR  Result Value Ref Range   Sodium 142 135 - 145 mEq/L   Potassium 2.3 (LL) 3.5 - 5.1 mEq/L   Chloride 112 96 - 112 mEq/L   CO2 17 (L) 19 - 32 mEq/L   Glucose, Bld 105 (H) 70 - 99 mg/dL   BUN 12 6 - 23 mg/dL   Creatinine, Ser 8.11 (H) 0.40 - 1.20 mg/dL   Total Bilirubin 0.3 0.2 - 1.2 mg/dL   Alkaline Phosphatase 81 39 - 117 U/L   AST 16 0 - 37 U/L   ALT 13 0 - 35 U/L   Total Protein 6.0 6.0 - 8.3 g/dL   Albumin  2.9 (L) 3.5 - 5.2 g/dL   GFR 73.57 (L) >39.99 mL/min   Calcium  7.7 (L) 8.4 - 10.5 mg/dL  Vitamin B12  Result Value Ref Range   Vitamin B-12 >1500 (H) 211 - 911 pg/mL  VITAMIN D  25 Hydroxy (Vit-D Deficiency, Fractures)  Result Value Ref Range   VITD 58.89 30.00 - 100.00 ng/mL  TSH  Result Value Ref Range   TSH 2.13 0.35 - 5.50 uIU/mL        Assessment & Plan:   Assessment and Plan Assessment & Plan Fatigue Fatigue likely due to recent illness, hospitalization, potential vitamin deficiencies, and anemia. Decreased hemoglobin levels noted. - Order B12 level. - Order vitamin D  level. - Order iron panel. - Encourage hydration. - Encourage physical activity within the home.  Hypokalemia - CMP today  Type 2 diabetes mellitus with stage 3b CKD without long term use of insulin  Currently managed with Jardiance  25 mg. A1c in August was 6.2, indicating good control. - Continue current management until recovery from recent illness.  recent AKI Management ongoing with sodium bicarbonate  and increased potassium supplementation. Recent nephrology consultation unchanged.  Heart murmur Heart murmur detected. No acute changes or interventions required.     Orders Placed This Encounter  Procedures   CBC with Differential/Platelet    Release to patient:   Immediate [1]   Comprehensive metabolic panel with GFR    Release to patient:   Immediate [1]   Vitamin B12   VITAMIN D  25 Hydroxy (Vit-D Deficiency, Fractures)   TSH   Iron, TIBC and Ferritin Panel     Meds ordered this encounter  Medications   cyanocobalamin  (VITAMIN B12) injection 1,000 mcg    Return for Dr. Norleen as scheduled, sooner if needed.  Corean LITTIE Ku, FNP

## 2024-05-12 NOTE — Telephone Encounter (Signed)
 CRITICAL VALUE STICKER  CRITICAL VALUE: Low Potassium 2.3  RECEIVER (on-site recipient of call): Hadassah  DATE & TIME NOTIFIED: 05/12/2024 @ 2:08 pm  MESSENGER (representative from lab): [email or web address]  MD NOTIFIED: YES  TIME OF NOTIFICATION: 2:09 pm

## 2024-05-12 NOTE — ED Triage Notes (Addendum)
 Patient was told her potassium is low 2.3 and she needs fluids. Feels tired. No pain anywhere.

## 2024-05-12 NOTE — Telephone Encounter (Signed)
 Addressed in results encounter, directed the patient to ER for potassium replacement

## 2024-05-13 ENCOUNTER — Observation Stay (HOSPITAL_COMMUNITY)

## 2024-05-13 ENCOUNTER — Encounter (HOSPITAL_COMMUNITY): Payer: Self-pay | Admitting: Internal Medicine

## 2024-05-13 DIAGNOSIS — E876 Hypokalemia: Secondary | ICD-10-CM

## 2024-05-13 DIAGNOSIS — E119 Type 2 diabetes mellitus without complications: Secondary | ICD-10-CM

## 2024-05-13 DIAGNOSIS — Z833 Family history of diabetes mellitus: Secondary | ICD-10-CM | POA: Diagnosis not present

## 2024-05-13 DIAGNOSIS — E785 Hyperlipidemia, unspecified: Secondary | ICD-10-CM | POA: Diagnosis present

## 2024-05-13 DIAGNOSIS — Z8249 Family history of ischemic heart disease and other diseases of the circulatory system: Secondary | ICD-10-CM | POA: Diagnosis not present

## 2024-05-13 DIAGNOSIS — J4540 Moderate persistent asthma, uncomplicated: Secondary | ICD-10-CM | POA: Diagnosis not present

## 2024-05-13 DIAGNOSIS — N1832 Chronic kidney disease, stage 3b: Secondary | ICD-10-CM | POA: Diagnosis not present

## 2024-05-13 DIAGNOSIS — N2589 Other disorders resulting from impaired renal tubular function: Secondary | ICD-10-CM

## 2024-05-13 DIAGNOSIS — E1122 Type 2 diabetes mellitus with diabetic chronic kidney disease: Secondary | ICD-10-CM | POA: Diagnosis present

## 2024-05-13 DIAGNOSIS — K219 Gastro-esophageal reflux disease without esophagitis: Secondary | ICD-10-CM

## 2024-05-13 DIAGNOSIS — Z7951 Long term (current) use of inhaled steroids: Secondary | ICD-10-CM | POA: Diagnosis not present

## 2024-05-13 DIAGNOSIS — Z7984 Long term (current) use of oral hypoglycemic drugs: Secondary | ICD-10-CM | POA: Diagnosis not present

## 2024-05-13 DIAGNOSIS — Z885 Allergy status to narcotic agent status: Secondary | ICD-10-CM | POA: Diagnosis not present

## 2024-05-13 DIAGNOSIS — Z96653 Presence of artificial knee joint, bilateral: Secondary | ICD-10-CM | POA: Diagnosis present

## 2024-05-13 DIAGNOSIS — Z8679 Personal history of other diseases of the circulatory system: Secondary | ICD-10-CM

## 2024-05-13 DIAGNOSIS — I129 Hypertensive chronic kidney disease with stage 1 through stage 4 chronic kidney disease, or unspecified chronic kidney disease: Secondary | ICD-10-CM | POA: Diagnosis present

## 2024-05-13 DIAGNOSIS — R531 Weakness: Secondary | ICD-10-CM | POA: Diagnosis not present

## 2024-05-13 DIAGNOSIS — E878 Other disorders of electrolyte and fluid balance, not elsewhere classified: Secondary | ICD-10-CM | POA: Diagnosis present

## 2024-05-13 DIAGNOSIS — Z79899 Other long term (current) drug therapy: Secondary | ICD-10-CM | POA: Diagnosis not present

## 2024-05-13 DIAGNOSIS — E039 Hypothyroidism, unspecified: Secondary | ICD-10-CM | POA: Diagnosis present

## 2024-05-13 DIAGNOSIS — Z90710 Acquired absence of both cervix and uterus: Secondary | ICD-10-CM | POA: Diagnosis not present

## 2024-05-13 DIAGNOSIS — Z888 Allergy status to other drugs, medicaments and biological substances status: Secondary | ICD-10-CM | POA: Diagnosis not present

## 2024-05-13 DIAGNOSIS — R197 Diarrhea, unspecified: Secondary | ICD-10-CM | POA: Diagnosis not present

## 2024-05-13 DIAGNOSIS — E872 Acidosis, unspecified: Secondary | ICD-10-CM | POA: Diagnosis not present

## 2024-05-13 DIAGNOSIS — Z886 Allergy status to analgesic agent status: Secondary | ICD-10-CM | POA: Diagnosis not present

## 2024-05-13 DIAGNOSIS — R0989 Other specified symptoms and signs involving the circulatory and respiratory systems: Secondary | ICD-10-CM | POA: Diagnosis not present

## 2024-05-13 LAB — URINALYSIS, COMPLETE (UACMP) WITH MICROSCOPIC
Bilirubin Urine: NEGATIVE
Glucose, UA: >=500 mg/dL — AB
Hgb urine dipstick: NEGATIVE
Ketones, ur: NEGATIVE mg/dL
Leukocytes,Ua: NEGATIVE
Nitrite: NEGATIVE
Protein, ur: 30 mg/dL — AB
Specific Gravity, Urine: 1.009 (ref 1.005–1.030)
pH: 6 (ref 5.0–8.0)

## 2024-05-13 LAB — COMPREHENSIVE METABOLIC PANEL WITH GFR
ALT: 9 U/L (ref 0–44)
AST: 17 U/L (ref 15–41)
Albumin: 2.6 g/dL — ABNORMAL LOW (ref 3.5–5.0)
Alkaline Phosphatase: 87 U/L (ref 38–126)
Anion gap: 13 (ref 5–15)
BUN: 11 mg/dL (ref 8–23)
CO2: 16 mmol/L — ABNORMAL LOW (ref 22–32)
Calcium: 7.7 mg/dL — ABNORMAL LOW (ref 8.9–10.3)
Chloride: 115 mmol/L — ABNORMAL HIGH (ref 98–111)
Creatinine, Ser: 1.71 mg/dL — ABNORMAL HIGH (ref 0.44–1.00)
GFR, Estimated: 31 mL/min — ABNORMAL LOW (ref >60)
Glucose, Bld: 90 mg/dL (ref 70–99)
Potassium: 2.9 mmol/L — ABNORMAL LOW (ref 3.5–5.1)
Sodium: 144 mmol/L (ref 135–145)
Total Bilirubin: 0.2 mg/dL (ref 0.0–1.2)
Total Protein: 5.3 g/dL — ABNORMAL LOW (ref 6.5–8.1)

## 2024-05-13 LAB — CBC WITH DIFFERENTIAL/PLATELET
Abs Immature Granulocytes: 0.11 K/uL — ABNORMAL HIGH (ref 0.00–0.07)
Basophils Absolute: 0 K/uL (ref 0.0–0.1)
Basophils Relative: 0 %
Eosinophils Absolute: 0 K/uL (ref 0.0–0.5)
Eosinophils Relative: 0 %
HCT: 28.5 % — ABNORMAL LOW (ref 36.0–46.0)
Hemoglobin: 9.1 g/dL — ABNORMAL LOW (ref 12.0–15.0)
Immature Granulocytes: 1 %
Lymphocytes Relative: 17 %
Lymphs Abs: 1.4 K/uL (ref 0.7–4.0)
MCH: 27.2 pg (ref 26.0–34.0)
MCHC: 31.9 g/dL (ref 30.0–36.0)
MCV: 85.1 fL (ref 80.0–100.0)
Monocytes Absolute: 0.6 K/uL (ref 0.1–1.0)
Monocytes Relative: 7 %
Neutro Abs: 5.8 K/uL (ref 1.7–7.7)
Neutrophils Relative %: 75 %
Platelets: 434 K/uL — ABNORMAL HIGH (ref 150–400)
RBC: 3.35 MIL/uL — ABNORMAL LOW (ref 3.87–5.11)
RDW: 19.2 % — ABNORMAL HIGH (ref 11.5–15.5)
WBC: 7.9 K/uL (ref 4.0–10.5)
nRBC: 0 % (ref 0.0–0.2)

## 2024-05-13 LAB — BASIC METABOLIC PANEL WITH GFR
Anion gap: 11 (ref 5–15)
BUN: 11 mg/dL (ref 8–23)
CO2: 15 mmol/L — ABNORMAL LOW (ref 22–32)
Calcium: 8.6 mg/dL — ABNORMAL LOW (ref 8.9–10.3)
Chloride: 114 mmol/L — ABNORMAL HIGH (ref 98–111)
Creatinine, Ser: 1.51 mg/dL — ABNORMAL HIGH (ref 0.44–1.00)
GFR, Estimated: 36 mL/min — ABNORMAL LOW (ref >60)
Glucose, Bld: 110 mg/dL — ABNORMAL HIGH (ref 70–99)
Potassium: 3.1 mmol/L — ABNORMAL LOW (ref 3.5–5.1)
Sodium: 141 mmol/L (ref 135–145)

## 2024-05-13 LAB — MAGNESIUM: Magnesium: 2.8 mg/dL — ABNORMAL HIGH (ref 1.7–2.4)

## 2024-05-13 LAB — TSH: TSH: 2 u[IU]/mL (ref 0.350–4.500)

## 2024-05-13 LAB — CBG MONITORING, ED
Glucose-Capillary: 91 mg/dL (ref 70–99)
Glucose-Capillary: 112 mg/dL — ABNORMAL HIGH (ref 70–99)
Glucose-Capillary: 127 mg/dL — ABNORMAL HIGH (ref 70–99)

## 2024-05-13 LAB — VITAMIN B12: Vitamin B-12: >4000 pg/mL — ABNORMAL HIGH (ref 180–914)

## 2024-05-13 LAB — NA AND K (SODIUM & POTASSIUM), RAND UR
Potassium Urine: 15 mmol/L
Sodium, Ur: <30 mmol/L

## 2024-05-13 LAB — GLUCOSE, CAPILLARY: Glucose-Capillary: 109 mg/dL — ABNORMAL HIGH (ref 70–99)

## 2024-05-13 LAB — CREATININE, URINE, RANDOM: Creatinine, Urine: 89 mg/dL

## 2024-05-13 MED ORDER — ALLOPURINOL 100 MG PO TABS: 100.0000 mg | ORAL_TABLET | Freq: Every day | ORAL | Status: DC

## 2024-05-13 MED ORDER — CLONAZEPAM 0.5 MG PO TABS: 0.5000 mg | ORAL_TABLET | Freq: Every day | ORAL | Status: DC | PRN

## 2024-05-13 MED ORDER — HEPARIN SODIUM (PORCINE) 5000 UNIT/ML IJ SOLN
5000.0000 [IU] | Freq: Three times a day (TID) | INTRAMUSCULAR | Status: DC
  Administered 2024-05-13 – 2024-05-17 (×11): 5000 [IU] via SUBCUTANEOUS
  Filled 2024-05-13 (×11): qty 1

## 2024-05-13 MED ORDER — FAMOTIDINE 20 MG PO TABS: 20.0000 mg | ORAL_TABLET | Freq: Every day | ORAL | Status: DC

## 2024-05-13 MED ORDER — INSULIN ASPART 100 UNIT/ML IJ SOLN
0.0000 [IU] | Freq: Every day | INTRAMUSCULAR | Status: DC
  Filled 2024-05-13: qty 0.05

## 2024-05-13 MED ORDER — SODIUM CHLORIDE 0.9 % IV BOLUS
250.0000 mL | Freq: Once | INTRAVENOUS | Status: AC
  Administered 2024-05-13: 250 mL via INTRAVENOUS

## 2024-05-13 MED ORDER — POTASSIUM CHLORIDE CRYS ER 20 MEQ PO TBCR
40.0000 meq | EXTENDED_RELEASE_TABLET | Freq: Once | ORAL | Status: AC
  Administered 2024-05-13: 40 meq via ORAL
  Filled 2024-05-13: qty 2

## 2024-05-13 MED ORDER — INSULIN ASPART 100 UNIT/ML IJ SOLN
0.0000 [IU] | Freq: Three times a day (TID) | INTRAMUSCULAR | Status: DC
  Filled 2024-05-13: qty 0.06

## 2024-05-13 MED ORDER — METOPROLOL SUCCINATE ER 100 MG PO TB24
100.0000 mg | ORAL_TABLET | Freq: Every day | ORAL | Status: DC
  Administered 2024-05-13 – 2024-05-17 (×5): 100 mg via ORAL
  Filled 2024-05-13 (×4): qty 1
  Filled 2024-05-13: qty 2

## 2024-05-13 MED ORDER — POTASSIUM CHLORIDE 20 MEQ PO PACK: 20.0000 meq | PACK | Freq: Every day | ORAL | Status: DC

## 2024-05-13 MED ORDER — CITALOPRAM HYDROBROMIDE 20 MG PO TABS
40.0000 mg | ORAL_TABLET | Freq: Every day | ORAL | Status: DC
  Administered 2024-05-13 – 2024-05-17 (×5): 40 mg via ORAL
  Filled 2024-05-13 (×2): qty 2
  Filled 2024-05-13: qty 4
  Filled 2024-05-13 (×2): qty 2

## 2024-05-13 MED ORDER — BUDESON-GLYCOPYRROL-FORMOTEROL 160-9-4.8 MCG/ACT IN AERO
2.0000 | INHALATION_SPRAY | Freq: Two times a day (BID) | RESPIRATORY_TRACT | Status: DC
Start: 2024-05-13 — End: 2024-05-17
  Administered 2024-05-13 – 2024-05-17 (×8): 2 via RESPIRATORY_TRACT
  Filled 2024-05-13: qty 5.9

## 2024-05-13 MED ORDER — ALLOPURINOL 100 MG PO TABS
100.0000 mg | ORAL_TABLET | Freq: Every day | ORAL | Status: DC
Start: 2024-05-13 — End: 2024-05-17
  Administered 2024-05-13 – 2024-05-17 (×5): 100 mg via ORAL
  Filled 2024-05-13 (×5): qty 1

## 2024-05-13 MED ORDER — SODIUM BICARBONATE 650 MG PO TABS
650.0000 mg | ORAL_TABLET | Freq: Two times a day (BID) | ORAL | Status: DC
  Administered 2024-05-13 (×2): 650 mg via ORAL
  Filled 2024-05-13 (×3): qty 1

## 2024-05-13 MED ORDER — MECLIZINE HCL 25 MG PO TABS: 25.0000 mg | ORAL_TABLET | Freq: Three times a day (TID) | ORAL | Status: DC | PRN

## 2024-05-13 MED ORDER — FAMOTIDINE 20 MG PO TABS
20.0000 mg | ORAL_TABLET | Freq: Two times a day (BID) | ORAL | Status: DC
  Administered 2024-05-13 – 2024-05-17 (×9): 20 mg via ORAL
  Filled 2024-05-13 (×9): qty 1

## 2024-05-13 MED ORDER — LATANOPROST 0.005 % OP SOLN
1.0000 [drp] | Freq: Every day | OPHTHALMIC | Status: DC
  Administered 2024-05-13 – 2024-05-16 (×4): 1 [drp] via OPHTHALMIC
  Filled 2024-05-13: qty 2.5

## 2024-05-13 MED ORDER — ALBUTEROL SULFATE (2.5 MG/3ML) 0.083% IN NEBU: 2.5000 mg | INHALATION_SOLUTION | RESPIRATORY_TRACT | Status: DC | PRN

## 2024-05-13 MED ORDER — ROSUVASTATIN CALCIUM 20 MG PO TABS
40.0000 mg | ORAL_TABLET | Freq: Every day | ORAL | Status: DC
  Administered 2024-05-13 – 2024-05-17 (×5): 40 mg via ORAL
  Filled 2024-05-13 (×5): qty 2

## 2024-05-13 MED ORDER — LORATADINE 10 MG PO TABS
10.0000 mg | ORAL_TABLET | Freq: Every day | ORAL | Status: DC
  Administered 2024-05-13 – 2024-05-17 (×5): 10 mg via ORAL
  Filled 2024-05-13 (×5): qty 1

## 2024-05-13 NOTE — Progress Notes (Signed)
 PROGRESS NOTE    Brooke [name]  FMW:[id number] DOB: October 01, 1950 DOA: 05/12/2024 PCP: Norleen Lynwood ORN, MD    Chief Complaint  Patient presents with   Abnormal Lab    Brief Narrative:  Patient pleasant 73 year old female history of type 2 diabetes, CKD 3B with baseline creatinine 1.5-1.9, GERD, hypertension, moderate persistent asthma presented to the ED with hypokalemia noted on lab work at PCPs office on hospital follow-up and sent to the ED for further evaluation.  Patient noted with complaints of generalized weakness and fatigue.  Patient noted on presentation to be hypokalemic and hypomagnesemic.    Assessment & Plan:   Principal Problem:   Hypokalemia Active Problems:   DM2 (diabetes mellitus, type 2) (HCC)   GERD (gastroesophageal reflux disease)   CKD stage 3b, GFR 30-44 ml/min (HCC)   Generalized weakness   Hypomagnesemia   Renal tubular acidosis   History of essential hypertension   Moderate persistent asthma   #1 hypokalemia/hypomagnesemia -Patient on presentation noted to have a potassium of 2.7 and a magnesium  level of 1.3. -Patient denies any nausea vomiting or diarrhea. - Patient with complaints of generalized weakness and fatigue. - Patient noted to be on omeprazole  in the outpatient setting, ophthalmic dorzolamide  as well as beta-2 agonist including Trelegy as well as as needed albuterol . - Patient noted to have received 40 mEq of oral potassium as well as 10 mEq of IV potassium in the ED and given an additional 40 mill equivalents of oral potassium. - Potassium noted at 2.9 this morning and magnesium  at 2.8. - 40 mEq of potassium p.o. x 1 ordered. - Patient per med rec noted to be on 20 mEq of potassium daily which we will resume tomorrow. - Repeat labs in the AM.  2.  Generalized weakness -Patient with complaints of 4 to 5 days of generalized weakness in the absence of any focal weakness. - Generalized weakness could be likely secondary to electrolyte  abnormalities. - Urinalysis nitrite negative, leukocytes negative. - Chest x-ray done negative for any acute abnormalities. - TSH within normal limits at 2.0. - Replete electrolytes. - PT/OT.  3.  CKD stage IIIb/metabolic acidosis -Patient noted to have a nonanion gap metabolic acidosis with evidence of hyperchloremia on admission. - Patient on bicarb tablets in the outpatient setting which we will continue. - Renal function stable at baseline. - Outpatient follow-up with nephrology.  4.  Well-controlled diabetes mellitus type 2 -Hemoglobin A1c 6.2 (02/26/2024) - Patient noted to be on Jardiance  prior to admission which is currently on hold. - SSI.  5.  GERD -Patient presented with hypomagnesemia and hypokalemia. - Patient noted to be on omeprazole  in the outpatient setting which we will discontinue. - Placed on Pepcid .  6.  Hypertension -Continue home regimen metoprolol . - Hold diltiazem . - Follow.  7.  Moderate persistent asthma -Patient noted to be on Trelegy and albuterol  in the outpatient setting. - Continue Trelegy.   DVT prophylaxis: Heparin  Code Status: Full Family Communication: Updated patient.  No family at bedside. Disposition: Likely home when clinically improved.   Status is: Inpatient The patient will require care spanning > 2 midnights and should be moved to inpatient because: Severity of illness.   Consultants:  None  Procedures:  Chest x-ray 05/13/2024   Antimicrobials:  Anti-infectives (From admission, onward)    None         Subjective: Patient lying in bed.  Still with complaints of weakness and fatigue.  Denies any chest pain or shortness of  breath.  No abdominal pain.  Tolerating current diet.  States is on oral potassium supplementation at home prior to admission and has been compliant with her medications.  Denies any recent nausea vomiting or diarrhea.  Objective: Vitals:   05/13/24 1600 05/13/24 1802 05/13/24 1947 05/13/24 2038   BP: (!) 145/74 (!) 147/80 (!) 158/77 (!) 142/80  Pulse: 72 77 71 77  Resp: 12 16 14 17   Temp:  (!) 97.5 F (36.4 C) 98.7 F (37.1 C) 98.7 F (37.1 C)  TempSrc:  Oral  Oral  SpO2: 99% 97% 98% 100%  Weight:      Height:        Intake/Output Summary (Last 24 hours) at 05/13/2024 2128 Last data filed at 05/12/2024 2134 Gross per 24 hour  Intake 100 ml  Output --  Net 100 ml   Filed Weights   05/12/24 1722  Weight: 78.5 kg    Examination:  General exam: Appears calm and comfortable  Respiratory system: Clear to auscultation. Respiratory effort normal. Cardiovascular system: S1 & S2 heard, RRR. No JVD, murmurs, rubs, gallops or clicks. No pedal edema. Gastrointestinal system: Abdomen is nondistended, soft and nontender. No organomegaly or masses felt. Normal bowel sounds heard. Central nervous system: Alert and oriented. No focal neurological deficits. Extremities: Symmetric 5 x 5 power. Skin: No rashes, lesions or ulcers Psychiatry: Judgement and insight appear normal. Mood & affect appropriate.     Data Reviewed: I have personally reviewed following labs and imaging studies  CBC: Recent Labs  Lab 05/12/24 0938 05/12/24 1746 05/13/24 0500  WBC 9.7 10.0 7.9  NEUTROABS 7.4  --  5.8  HGB 10.0* 10.1* 9.1*  HCT 30.7* 31.9* 28.5*  MCV 82.3 85.1 85.1  PLT 443.0* 495* 434*    Basic Metabolic Panel: Recent Labs  Lab 05/12/24 0938 05/12/24 1746 05/12/24 1808 05/13/24 0500  NA 142 143  --  144  K 2.3* 2.7*  --  2.9*  CL 112 112*  --  115*  CO2 17* 16*  --  16*  GLUCOSE 105* 98  --  90  BUN 12 12  --  11  CREATININE 1.88* 1.84*  --  1.71*  CALCIUM  7.7* 7.9*  --  7.7*  MG  --   --  1.3* 2.8*    GFR: Estimated Creatinine Clearance: 29.5 mL/min (A) (by C-G formula based on SCr of 1.71 mg/dL (H)).  Liver Function Tests: Recent Labs  Lab 05/12/24 0938 05/13/24 0500  AST 16 17  ALT 13 9  ALKPHOS 81 87  BILITOT 0.3 0.2  PROT 6.0 5.3*  ALBUMIN 2.9* 2.6*     CBG: Recent Labs  Lab 05/13/24 0852 05/13/24 1441 05/13/24 1944 05/13/24 2101  GLUCAP 91 112* 127* 109*     No results found for this or any previous visit (from the past 240 hours).       Radiology Studies: DG Chest Port 1 View Result Date: 05/13/2024 EXAM: 1 VIEW(S) XRAY OF THE CHEST 05/13/2024 07:17:00 AM COMPARISON: 02/18/2024 CLINICAL HISTORY: Generalized weakness 412262. Weakness FINDINGS: LUNGS AND PLEURA: Lower lung volumes. No focal pulmonary opacity. No pulmonary edema. No pleural effusion. No pneumothorax. HEART AND MEDIASTINUM: No acute abnormality of the cardiac and mediastinal silhouettes. BONES AND SOFT TISSUES: No acute osseous abnormality. IMPRESSION: 1. No acute cardiopulmonary process. Electronically signed by: Waddell Calk MD 05/13/2024 07:21 AM EDT RP Workstation: [id number]        Scheduled Meds:  allopurinol   100 mg Oral Daily  budesonide -glycopyrrolate -formoterol   2 puff Inhalation BID   citalopram   40 mg Oral Daily   famotidine   20 mg Oral BID   heparin  injection (subcutaneous)  5,000 Units Subcutaneous Q8H   insulin  aspart  0-5 Units Subcutaneous QHS   insulin  aspart  0-6 Units Subcutaneous TID WC   latanoprost   1 drop Left Eye QHS   loratadine  10 mg Oral Daily   metoprolol  succinate  100 mg Oral Daily   [START ON 05/14/2024] potassium chloride   20 mEq Oral Daily   rosuvastatin   40 mg Oral Daily   sodium bicarbonate   650 mg Oral BID   Continuous Infusions:   LOS: 0 days    Time spent: 40 minutes    Toribio Hummer, MD [facility]   To contact the attending provider between 7A-7P or the covering provider during after hours 7P-7A, please log into the web site [email or web address] and access using universal [place] password for that web site. If you do not have the password, please call the hospital operator.  05/13/2024, 9:28 PM

## 2024-05-13 NOTE — Evaluation (Signed)
 Physical Therapy Evaluation Patient Details Name: Brooke [name] MRN: [id number] DOB: 02-03-1951 Today's Date: 05/13/2024  History of Present Illness  Brooke [name] is a 73 y/o F admitted 05/12/24 with hypokalemia from her PCP during a follow up after a recent hospitalization in the [facility] system from 04/29/2024 to 05/05/2024 for COVID, chest pain and ileus. PMH includes Alopecia, Anxiety, Aortic atherosclerosis, Asthma, Gout, Heart murmur, syncope (2015), HLD, HTN, Hypothyroidism, OA (osteoarthritis) of knee, PVC's, T2DM, Ventricular tachycardia, left heart catheterization with coronary angiogram (01/07/2014); Renal artery stent (Left, 2005, 2006); Abdominal hysterectomy (2001); and TKA 2019/2020.  Clinical Impression  Patient reports need for  ambulating to BR, some BM incontinence.  Patient ambulated 80' x 2 using Rw. Patient reports feeling very tired. Gait slow. HR 84,  BP 156/88/100%   Patient recently in hospital, reports was ambulatory with RW at home after returning. Resides with son and family.  Recommend HHPT. Pt admitted with above diagnosis.  Pt currently with functional limitations due to the deficits listed below (see PT Problem List). Pt will benefit from acute skilled PT to increase their independence and safety with mobility to allow discharge.         If plan is discharge home, recommend the following: A little help with bathing/dressing/bathroom;Help with stairs or ramp for entrance;Assist for transportation;Assistance with cooking/housework   Can travel by private vehicle        Equipment Recommendations None recommended by PT  Recommendations for Other Services       Functional Status Assessment Patient has had a recent decline in their functional status and demonstrates the ability to make significant improvements in function in a reasonable and predictable amount of time.     Precautions / Restrictions Precautions Precautions: Fall Restrictions Weight  Bearing Restrictions Per Provider Order: No      Mobility  Bed Mobility   Bed Mobility: Supine to Sit, Sit to Supine     Supine to sit: Supervision Sit to supine: Supervision   General bed mobility comments: ED stretcher    Transfers Overall transfer level: Needs assistance Equipment used: Rolling walker (2 wheels) Transfers: Sit to/from Stand, Bed to chair/wheelchair/BSC Sit to Stand: Supervision Stand pivot transfers: Contact guard assist         General transfer comment: extra effort to rise from lower toilet, use of rail    Ambulation/Gait Ambulation/Gait assistance: Contact guard assist Gait Distance (Feet): 80 Feet (x2) Assistive device: Rolling walker (2 wheels) Gait Pattern/deviations: Step-through pattern, Decreased step length - left, Decreased step length - right, Decreased stride length Gait velocity: decr     General Gait Details: patient  reports need for BR, gait slow and sluggish  Stairs            Wheelchair Mobility     Tilt Bed    Modified Rankin (Stroke Patients Only)       Balance   Sitting-balance support: No upper extremity supported, Feet unsupported Sitting balance-Leahy Scale: Fair     Standing balance support: During functional activity, Bilateral upper extremity supported, Reliant on assistive device for balance Standing balance-Leahy Scale: Poor Standing balance comment: static fair at sink                             Pertinent Vitals/Pain Pain Assessment Faces Pain Scale: No hurt    Home Living Family/patient expects to be discharged to:: Private residence Living Arrangements: Children Available Help at Discharge: Family;Available  24 hours/day Type of Home: House Home Access: Stairs to enter Entrance Stairs-Rails: Right;Can reach Technical sales engineer of Steps: 3   Home Layout: One level Home Equipment: Rollator (4 wheels);Cane - single point;Hand held shower head Additional  Comments: 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    Prior Function Prior Level of Function : Independent/Modified Independent             Mobility Comments: rollator for household amb ADLs Comments: reports dtr and son assist as needed but she is typically ind with ADLs     Extremity/Trunk Assessment   Upper Extremity Assessment Upper Extremity Assessment: Overall WFL for tasks assessed    Lower Extremity Assessment Lower Extremity Assessment: Overall WFL for tasks assessed    Cervical / Trunk Assessment Cervical / Trunk Assessment: Normal  Communication   Communication Communication: No apparent difficulties    Cognition Arousal: Alert Behavior During Therapy: WFL for tasks assessed/performed   PT - Cognitive impairments: No apparent impairments                         Following commands: Intact       Cueing Cueing Techniques: Verbal cues     General Comments      Exercises     Assessment/Plan    PT Assessment Patient needs continued PT services  PT Problem List Decreased activity tolerance;Decreased balance;Decreased mobility;Decreased safety awareness       PT Treatment Interventions DME instruction;Therapeutic activities;Gait training;Functional mobility training;Therapeutic exercise;Patient/family education;Balance training    PT Goals (Current goals can be found in the Care Plan section)  Acute Rehab PT Goals PT Goal Formulation: With patient Time For Goal Achievement: 05/27/24 Potential to Achieve Goals: Good    Frequency Min 2X/week     Co-evaluation               AM-PAC PT 6 Clicks Mobility  Outcome Measure Help needed turning from your back to your side while in a flat bed without using bedrails?: None Help needed moving from lying on your back to sitting on the side of a flat bed without using bedrails?: None Help needed moving to and from a bed to a chair (including a wheelchair)?: A Little Help needed standing up  from a chair using your arms (e.g., wheelchair or bedside chair)?: A Little Help needed to walk in hospital room?: A Little Help needed climbing 3-5 steps with a railing? : A Lot 6 Click Score: 19    End of Session Equipment Utilized During Treatment: Gait belt Activity Tolerance: Patient limited by fatigue;Other (comment) Patient left: in bed;with call bell/phone within reach Nurse Communication: Mobility status PT Visit Diagnosis: Difficulty in walking, not elsewhere classified (R26.2);Muscle weakness (generalized) (M62.81);Unsteadiness on feet (R26.81)    Time: 8884-8857 PT Time Calculation (min) (ACUTE ONLY): 27 min   Charges:   PT Evaluation $PT Eval Low Complexity: 1 Low PT Treatments $Gait Training: 8-22 mins PT General Charges $$ ACUTE PT VISIT: 1 Visit         Darice Potters PT Acute Rehabilitation Services Office [phone]  Potters Darice Norris 05/13/2024, 2:46 PM

## 2024-05-13 NOTE — ED Notes (Signed)
 Vital signs stable.Sugar 112

## 2024-05-13 NOTE — ED Notes (Signed)
 Report was received from previous RN. Pt is requesting comfortably in bed. Bed side table moved closer to pt. Pt provide with water  and water  pitcher. Pt remains on VS monitor. Call bell is within reach. Pt denies any additional needs.

## 2024-05-13 NOTE — Evaluation (Signed)
 Occupational Therapy Evaluation Patient Details Name: Brooke [name] MRN: [id number] DOB: 1950/09/26 Today's Date: 05/13/2024   History of Present Illness   Brooke [name] is a 73 y/o F admitted 05/12/24 with hypokalemia from her PCP during a follow up after a recent hospitalization in the [place] system from 04/29/2024 to 05/05/2024 for COVID, chest pain and ileus. PMH includes Alopecia, Anxiety, Aortic atherosclerosis, Asthma, Gout, Heart murmur, syncope (2015), HLD, HTN, Hypothyroidism, OA (osteoarthritis) of knee, PVC's, T2DM, Ventricular tachycardia, left heart catheterization with coronary angiogram (01/07/2014); Renal artery stent (Left, 2005, 2006); Abdominal hysterectomy (2001); and TKA 2019/2020.     Clinical Impressions Pt is from home after recent hospitalization where she had JUST started [facility] therapies. She is typically mod I with her Rollator and typically mod I for ADL (does get PRN assist from son/daughter). Today Pt is GCA to supervision for transfers and mobility with RW. She was generally CGA for UB and LB ADL in seated and standing position. She will benefit from skilled OT in the acute setting prior to dc home where she should resume HHOT to maximize safety and independence in ADL and functional transfers. Next session to focus on energy conservation strategies during ADL.      If plan is discharge home, recommend the following:   A little help with walking and/or transfers;A little help with bathing/dressing/bathroom;Assistance with cooking/housework;Assist for transportation;Help with stairs or ramp for entrance     Functional Status Assessment   Patient has had a recent decline in their functional status and demonstrates the ability to make significant improvements in function in a reasonable and predictable amount of time.     Equipment Recommendations   None recommended by OT (Pt has appropriate DME)     Recommendations for Other Services   PT  consult     Precautions/Restrictions   Precautions Precautions: Fall Restrictions Weight Bearing Restrictions Per Provider Order: No     Mobility Bed Mobility Overal bed mobility: Needs Assistance Bed Mobility: Supine to Sit, Sit to Supine     Supine to sit: Supervision Sit to supine: Supervision   General bed mobility comments: ED stretcher    Transfers Overall transfer level: Needs assistance Equipment used: Rolling walker (2 wheels) Transfers: Sit to/from Stand, Bed to chair/wheelchair/BSC Sit to Stand: Supervision           General transfer comment: good hand placement. used to rollator, verbalized good lock safety for Rollator      Balance Overall balance assessment: Needs assistance Sitting-balance support: No upper extremity supported, Feet unsupported Sitting balance-Leahy Scale: Fair     Standing balance support: During functional activity, Bilateral upper extremity supported, Reliant on assistive device for balance Standing balance-Leahy Scale: Poor                             ADL either performed or assessed with clinical judgement   ADL Overall ADL's : Needs assistance/impaired     Grooming: Oral care;Wash/dry face;Standing;Supervision/safety   Upper Body Bathing: Supervision/ safety;Sitting   Lower Body Bathing: Minimal assistance;Sit to/from stand   Upper Body Dressing : Supervision/safety;Sitting   Lower Body Dressing: Set up;Sitting/lateral leans Lower Body Dressing Details (indicate cue type and reason): extra time Toilet Transfer: Contact guard assist;Rolling walker (2 wheels);Ambulation   Toileting- Clothing Manipulation and Hygiene: Supervision/safety;Sitting/lateral lean       Functional mobility during ADLs: Contact guard assist;Rolling walker (2 wheels)  Vision Baseline Vision/History: 1 Wears glasses;0 No visual deficits Ability to See in Adequate Light: 0 Adequate Patient Visual Report: No change from  baseline Vision Assessment?: No apparent visual deficits     Perception         Praxis         Pertinent Vitals/Pain Pain Assessment Pain Assessment: No/denies pain Pain Intervention(s): Monitored during session, Repositioned     Extremity/Trunk Assessment Upper Extremity Assessment Upper Extremity Assessment: Overall WFL for tasks assessed   Lower Extremity Assessment Lower Extremity Assessment: Defer to PT evaluation   Cervical / Trunk Assessment Cervical / Trunk Assessment: Normal   Communication Communication Communication: No apparent difficulties   Cognition Arousal: Alert Behavior During Therapy: WFL for tasks assessed/performed Cognition: No apparent impairments                               Following commands: Intact       Cueing  General Comments   Cueing Techniques: Verbal cues  VSS before and after activity   Exercises     Shoulder Instructions      Home Living Family/patient expects to be discharged to:: Private residence Living Arrangements: Children (Son and daughter) Available Help at Discharge: Family;Available 24 hours/day Type of Home: House Home Access: Stairs to enter [employer] of Steps: 3 Entrance Stairs-Rails: Right;Can reach both;Left Home Layout: One level     Bathroom Shower/Tub: IT trainer: Standard Bathroom Accessibility: Yes How Accessible: Accessible via walker Home Equipment: Rollator (4 wheels);Cane - single point;Hand held shower head   Additional Comments: 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      Prior Functioning/Environment Prior Level of Function : Independent/Modified Independent             Mobility Comments: rollator for household amb ADLs Comments: reports dtr and son assist as needed but she is typically ind with ADLs    OT Problem List: Impaired balance (sitting and/or standing);Decreased safety awareness;Decreased activity  tolerance   OT Treatment/Interventions: Self-care/ADL training;Therapeutic exercise;Energy conservation;DME and/or AE instruction;Therapeutic activities;Patient/family education;Balance training      OT Goals(Current goals can be found in the care plan section)   Acute Rehab OT Goals Patient Stated Goal: get better and STAY home OT Goal Formulation: With patient Time For Goal Achievement: 05/27/24 Potential to Achieve Goals: Good ADL Goals Pt Will Perform Grooming: with modified independence;standing Pt Will Perform Upper Body Dressing: with modified independence;sitting Pt Will Perform Lower Body Dressing: with modified independence;sit to/from stand Pt Will Transfer to Toilet: with modified independence;ambulating;regular height toilet Pt Will Perform Toileting - Clothing Manipulation and hygiene: with modified independence;sitting/lateral leans;sit to/from stand Additional ADL Goal #1: Pt will verbalize at least 3 strategies for energy conservation during ADL with no cues   OT Frequency:  Min 2X/week    Co-evaluation              AM-PAC OT 6 Clicks Daily Activity     Outcome Measure Help from another person eating meals?: None Help from another person taking care of personal grooming?: A Little Help from another person toileting, which includes using toliet, bedpan, or urinal?: A Little Help from another person bathing (including washing, rinsing, drying)?: A Little Help from another person to put on and taking off regular upper body clothing?: A Little Help from another person to put on and taking off regular lower body clothing?: A Little 6  Click Score: 19   End of Session Equipment Utilized During Treatment: Gait belt;Rolling walker (2 wheels) Nurse Communication: Mobility status  Activity Tolerance: Patient tolerated treatment well Patient left: with call bell/phone within reach (ED stretcher)  OT Visit Diagnosis: Unsteadiness on feet (R26.81);Muscle weakness  (generalized) (M62.81)                Time: 9062-9044 OT Time Calculation (min): 18 min Charges:  OT General Charges $OT Visit: 1 Visit OT Evaluation $OT Eval Low Complexity: 1 Low  Leita DEL OTR/L Acute Rehabilitation Services Office: [phone]   Leita PARAS [facility] 05/13/2024, 11:28 AM

## 2024-05-14 DIAGNOSIS — E876 Hypokalemia: Secondary | ICD-10-CM | POA: Diagnosis not present

## 2024-05-14 DIAGNOSIS — N1832 Chronic kidney disease, stage 3b: Secondary | ICD-10-CM | POA: Diagnosis not present

## 2024-05-14 DIAGNOSIS — K219 Gastro-esophageal reflux disease without esophagitis: Secondary | ICD-10-CM | POA: Diagnosis not present

## 2024-05-14 DIAGNOSIS — R197 Diarrhea, unspecified: Secondary | ICD-10-CM

## 2024-05-14 LAB — RENAL FUNCTION PANEL
Albumin: 2.2 g/dL — ABNORMAL LOW (ref 3.5–5.0)
Anion gap: 10 (ref 5–15)
BUN: 10 mg/dL (ref 8–23)
CO2: 15 mmol/L — ABNORMAL LOW (ref 22–32)
Calcium: 8 mg/dL — ABNORMAL LOW (ref 8.9–10.3)
Chloride: 117 mmol/L — ABNORMAL HIGH (ref 98–111)
Creatinine, Ser: 1.41 mg/dL — ABNORMAL HIGH (ref 0.44–1.00)
GFR, Estimated: 39 mL/min — ABNORMAL LOW (ref >60)
Glucose, Bld: 87 mg/dL (ref 70–99)
Phosphorus: 2.9 mg/dL (ref 2.5–4.6)
Potassium: 2.8 mmol/L — ABNORMAL LOW (ref 3.5–5.1)
Sodium: 141 mmol/L (ref 135–145)

## 2024-05-14 LAB — BASIC METABOLIC PANEL WITH GFR
Anion gap: 9 (ref 5–15)
BUN: 9 mg/dL (ref 8–23)
CO2: 16 mmol/L — ABNORMAL LOW (ref 22–32)
Calcium: 8.3 mg/dL — ABNORMAL LOW (ref 8.9–10.3)
Chloride: 114 mmol/L — ABNORMAL HIGH (ref 98–111)
Creatinine, Ser: 1.44 mg/dL — ABNORMAL HIGH (ref 0.44–1.00)
GFR, Estimated: 38 mL/min — ABNORMAL LOW (ref >60)
Glucose, Bld: 85 mg/dL (ref 70–99)
Potassium: 3.8 mmol/L (ref 3.5–5.1)
Sodium: 139 mmol/L (ref 135–145)

## 2024-05-14 LAB — CBC
HCT: 26.3 % — ABNORMAL LOW (ref 36.0–46.0)
HCT: 28 % — ABNORMAL LOW (ref 36.0–46.0)
Hemoglobin: 7.9 g/dL — ABNORMAL LOW (ref 12.0–15.0)
Hemoglobin: 8.6 g/dL — ABNORMAL LOW (ref 12.0–15.0)
MCH: 25.7 pg — ABNORMAL LOW (ref 26.0–34.0)
MCH: 26.5 pg (ref 26.0–34.0)
MCHC: 30 g/dL (ref 30.0–36.0)
MCHC: 30.7 g/dL (ref 30.0–36.0)
MCV: 85.7 fL (ref 80.0–100.0)
MCV: 86.2 fL (ref 80.0–100.0)
Platelets: 424 K/uL — ABNORMAL HIGH (ref 150–400)
Platelets: 387 K/uL (ref 150–400)
RBC: 3.07 MIL/uL — ABNORMAL LOW (ref 3.87–5.11)
RBC: 3.25 MIL/uL — ABNORMAL LOW (ref 3.87–5.11)
RDW: 19.7 % — ABNORMAL HIGH (ref 11.5–15.5)
RDW: 19.9 % — ABNORMAL HIGH (ref 11.5–15.5)
WBC: 7.5 K/uL (ref 4.0–10.5)
WBC: 6.5 K/uL (ref 4.0–10.5)
nRBC: 0 % (ref 0.0–0.2)
nRBC: 0 % (ref 0.0–0.2)

## 2024-05-14 LAB — MAGNESIUM: Magnesium: 2.3 mg/dL (ref 1.7–2.4)

## 2024-05-14 LAB — GLUCOSE, CAPILLARY
Glucose-Capillary: 84 mg/dL (ref 70–99)
Glucose-Capillary: 119 mg/dL — ABNORMAL HIGH (ref 70–99)
Glucose-Capillary: 89 mg/dL (ref 70–99)
Glucose-Capillary: 111 mg/dL — ABNORMAL HIGH (ref 70–99)

## 2024-05-14 LAB — C DIFFICILE QUICK SCREEN W PCR REFLEX
C Diff antigen: NEGATIVE
C Diff interpretation: NOT DETECTED
C Diff toxin: NEGATIVE

## 2024-05-14 MED ORDER — POTASSIUM CHLORIDE CRYS ER 20 MEQ PO TBCR
40.0000 meq | EXTENDED_RELEASE_TABLET | ORAL | Status: AC
  Administered 2024-05-14 (×2): 40 meq via ORAL
  Filled 2024-05-14 (×2): qty 2

## 2024-05-14 MED ORDER — POTASSIUM CHLORIDE 20 MEQ PO PACK
20.0000 meq | PACK | Freq: Every day | ORAL | Status: DC
  Administered 2024-05-15: 20 meq via ORAL
  Filled 2024-05-14: qty 1

## 2024-05-14 MED ORDER — SODIUM BICARBONATE 650 MG PO TABS
1300.0000 mg | ORAL_TABLET | Freq: Two times a day (BID) | ORAL | Status: DC
  Administered 2024-05-14 – 2024-05-17 (×7): 1300 mg via ORAL
  Filled 2024-05-14 (×7): qty 2

## 2024-05-14 MED ORDER — LACTATED RINGERS IV SOLN: INTRAVENOUS | Status: DC

## 2024-05-14 NOTE — TOC Initial Note (Signed)
 Transition of Care [facility]) - Initial/Assessment Note   Patient Details  Name: Brooke [name] MRN: [id number] Date of Birth: Apr 09, 1951  Transition of Care [facility]) CM/SW Contact:    Duwaine GORMAN Aran, LCSW Phone Number: 05/14/2024, 12:43 PM  Clinical Narrative: PT/OT evaluations recommended HH. Patient requested [facility]. CSW confirmed with Arna with [facility] that the agency can provide PT/OT. HH orders requested.  Expected Discharge Plan: Home w Home Health Services Barriers to Discharge: Continued Medical Work up  Patient Goals and CMS Choice Patient states their goals for this hospitalization and ongoing recovery are:: Get [facility] through [facility] CMS Medicare.gov Compare Post Acute Care list provided to:: Patient Choice offered to / list presented to : Patient  Expected Discharge Plan and Services In-house Referral: Clinical Social Work Post Acute Care Choice: Home Health Living arrangements for the past 2 months: Single Family Home           DME Arranged: N/A DME Agency: NA HH Arranged: PT, OT HH Agency: [facility] Date HH Agency Contacted: 05/14/24 Representative spoke with at [facility] Agency: Arna  Prior Living Arrangements/Services Living arrangements for the past 2 months: Single Family Home Lives with:: Adult Children Patient language and need for interpreter reviewed:: Yes Do you feel safe going back to the place where you live?: Yes      Need for Family Participation in Patient Care: No (Comment) Care giver support system in place?: Yes (comment) Current home services: DME [facility]) Criminal Activity/Legal Involvement Pertinent to Current Situation/Hospitalization: No - Comment as needed  Activities of Daily Living ADL Screening (condition at time of admission) Independently performs ADLs?: Yes (appropriate for developmental age) Is the patient deaf or have difficulty hearing?: No Does the patient have difficulty seeing, even when wearing glasses/contacts?: No Does  the patient have difficulty concentrating, remembering, or making decisions?: No  Permission Sought/Granted Permission sought to share information with : Other (comment) Permission granted to share information with : Yes, Verbal Permission Granted Permission granted to share info w AGENCY: [facility]  Emotional Assessment Appearance:: Appears stated age Attitude/Demeanor/Rapport: Engaged Affect (typically observed): Accepting Orientation: : Oriented to Self, Oriented to Place, Oriented to Situation, Oriented to  Time Alcohol / Substance Use: Not Applicable Psych Involvement: No (comment)  Admission diagnosis:  Hypokalemia [E87.6] Hypomagnesemia [E83.42] Patient Active Problem List   Diagnosis Date Noted   Diarrhea 05/14/2024   Hypomagnesemia 05/13/2024   Renal tubular acidosis 05/13/2024   History of essential hypertension 05/13/2024   Moderate persistent asthma 05/13/2024   Other fatigue 05/12/2024   Heart murmur 05/12/2024   Class 1 obesity due to excess calories with body mass index (BMI) of 31.0 to 31.9 in adult 05/05/2024   Small bowel obstruction (HCC) 05/02/2024   Hypernatremia 05/02/2024   Murmur, cardiac 05/01/2024   Abnormal echocardiogram 05/01/2024   Tachycardia 05/01/2024   Ileus (HCC) 04/28/2024   Abdominal pain 04/28/2024   Abnormal CT of the abdomen 04/28/2024   Nausea & vomiting 04/15/2024   COVID-19 virus infection 04/15/2024   Nausea and vomiting 04/15/2024   Dizziness 04/03/2024   Pneumonia 02/27/2024   UTI (urinary tract infection) 02/27/2024   Pancreatic lesion 02/27/2024   Cerumen impaction 02/27/2024   AKI (acute kidney injury) 02/20/2024   ARF (acute renal failure) 02/18/2024   Scalp pain 06/08/2023   Significant closed head trauma within past 3 months 02/25/2023   Chronic cough 02/25/2023   COPD (chronic obstructive pulmonary disease) (HCC) 09/08/2022   Pseudogout of left wrist 08/08/2022   Generalized  weakness 05/18/2022   Left ear impacted  cerumen 05/16/2022   Left kidney mass 01/14/2022   Peripheral edema 01/14/2022   Left ear hearing loss 12/05/2021   Nasal crusting 12/04/2021   Left ear pain 08/29/2021   Grief 06/05/2021   Acute shoulder bursitis, left 06/05/2021   Bilateral shoulder pain 03/05/2020   Vitamin D  deficiency 12/01/2019   Allergic rhinitis 06/16/2019   Asthma exacerbation 06/16/2019   Dyspnea 01/27/2019   Primary osteoarthritis of left knee 09/30/2018   CKD stage 3b, GFR 30-44 ml/min (HCC) 07/25/2018   Chronic back pain 07/25/2018   Primary osteoarthritis of right knee 05/05/2018   Osteoarthritis of right knee 04/30/2018   Degenerative arthritis of knee, bilateral 08/07/2017   Depression with anxiety 07/19/2017   Vertigo 01/12/2017   Low back pain 01/12/2017   SOB (shortness of breath) 12/02/2016   Dysuria 09/12/2016   Bilateral knee pain 02/15/2015   Effusion of right knee 01/28/2015   Asthma with exacerbation 11/09/2014   Pronation deformity of both feet 09/29/2014   Greater trochanteric bursitis of left hip 08/31/2014   Degenerative arthritis of left knee 05/18/2014   Ventricular tachycardia, polymorphic (HCC) 01/04/2014   Abnormal MRI of head 12/04/2013   Syncope 11/06/2013   Chest pain 11/06/2013   Headache 11/06/2013   Nausea with vomiting 11/06/2013   Hypokalemia 08/18/2012   Diastolic dysfunction 01/15/2011   Leukocytosis 01/03/2011   Toe pain 12/20/2010   Encounter for well adult exam with abnormal findings 12/19/2010   FATIGUE 09/05/2009   DM2 (diabetes mellitus, type 2) (HCC) 11/29/2008   Gout 08/09/2008   Normochromic normocytic anemia 02/09/2008   GERD (gastroesophageal reflux disease) 02/09/2008   ALOPECIA 12/30/2007   Hyperlipidemia 10/01/2007   Essential hypertension 02/26/2007   History of colonic polyps 02/26/2007   Goiter 09/19/2006   Asthma 09/19/2006   ECZEMA, ATOPIC DERMATITIS 09/19/2006   Insomnia 09/19/2006   PCP:  Norleen Lynwood ORN, MD Pharmacy:    [facility] [phone] - [place], [place] - [address] AT [facility] [address] [place] [address] Phone: [phone] Fax: [phone]  [id number] - [place], [place] - [address] AT [facility] [address] [place] [address] Phone: [phone] Fax: [phone]  Social Drivers of Health (SDOH) Social History: SDOH Screenings   Food Insecurity: No Food Insecurity (05/13/2024)  Housing: Low Risk  (05/13/2024)  Transportation Needs: No Transportation Needs (05/13/2024)  Utilities: Not At Risk (05/13/2024)  Alcohol Screen: Low Risk  (09/24/2023)  Depression (PHQ2-9): Low Risk  (04/22/2024)  Financial Resource Strain: Low Risk  (09/24/2023)  Physical Activity: Insufficiently Active (09/24/2023)  Social Connections: Moderately Isolated (05/13/2024)  Stress: No Stress Concern Present (09/24/2023)  Tobacco Use: Low Risk  (05/13/2024)  Health Literacy: Adequate Health Literacy (09/24/2023)   SDOH Interventions:    Readmission Risk Interventions    05/14/2024   12:41 PM 05/05/2024   11:28 AM 04/16/2024    9:45 AM  Readmission Risk Prevention Plan  Transportation Screening Complete Complete Complete  PCP or Specialist Appt within 3-5 Days   Complete  HRI or Home Care Consult  Complete Complete  Social Work Consult for Recovery Care Planning/Counseling  Complete Complete  Palliative Care Screening  Not Applicable Not Applicable  Medication Review Oceanographer) Complete Complete Complete  HRI or Home Care Consult Complete    SW Recovery Care/Counseling Consult Complete    Palliative Care Screening Not Applicable    Skilled Nursing Facility  Not Applicable

## 2024-05-14 NOTE — Progress Notes (Signed)
 PT Cancellation Note  Patient Details Name: Brooke [name] MRN: [id number] DOB: [birth year]   Cancelled Treatment:    Reason Eval/Treat Not Completed: Fatigue/lethargy limiting ability to participate; attempted twice and pt too fatigued.  Will continue attempts.    Montie Portal 05/14/2024, 5:04 PM Micheline Portal, PT Acute Rehabilitation Services Office:[phone] 05/14/2024

## 2024-05-14 NOTE — Progress Notes (Signed)
 PROGRESS NOTE    Brooke [name]  FMW:[id number] DOB: 09-27-1950 DOA: 05/12/2024 PCP: Norleen Lynwood ORN, MD    Chief Complaint  Patient presents with   Abnormal Lab    Brief Narrative:  Patient pleasant 73 year old female history of type 2 diabetes, CKD 3B with baseline creatinine 1.5-1.9, GERD, hypertension, moderate persistent asthma presented to the ED with hypokalemia noted on lab work at PCPs office on hospital follow-up and sent to the ED for further evaluation.  Patient noted with complaints of generalized weakness and fatigue.  Patient noted on presentation to be hypokalemic and hypomagnesemic.    Assessment & Plan:   Principal Problem:   Hypokalemia Active Problems:   DM2 (diabetes mellitus, type 2) (HCC)   GERD (gastroesophageal reflux disease)   CKD stage 3b, GFR 30-44 ml/min (HCC)   Generalized weakness   Hypomagnesemia   Renal tubular acidosis   History of essential hypertension   Moderate persistent asthma   Diarrhea   #1 hypokalemia/hypomagnesemia -Patient on presentation noted to have a potassium of 2.7 and a magnesium  level of 1.3. -Patient noted to have multiple watery loose stools overnight.  -Likely etiology of electrolyte abnormalities as patient has diarrhea. - Patient with complaints of generalized weakness and fatigue. - Patient noted to be on omeprazole  in the outpatient setting, ophthalmic dorzolamide  as well as beta-2 agonist including Trelegy as well as as needed albuterol . - Patient noted to have received 40 mEq of oral potassium as well as 10 mEq of IV potassium in the ED and given an additional 40 mill equivalents of oral potassium x 2 on 05/13/2024. - Potassium noted at 2.8 this morning and magnesium  at 2.3. - kdur 40 mEq p.o. every 4 hours x 2 doses.  - Patient per med rec noted to be on 20 mEq of potassium daily which we will resume tomorrow. -Place on LR 75 cc/h for the next 24 hours. - Repeat labs this afternoon and in the AM.  2.   Generalized weakness -Patient with complaints of 4 to 5 days of generalized weakness in the absence of any focal weakness. - Generalized weakness could be likely secondary to electrolyte abnormalities in the setting of GI losses of diarrhea. - Urinalysis nitrite negative, leukocytes negative. - Chest x-ray done negative for any acute abnormalities. - TSH within normal limits at 2.0. - Replete electrolytes. - PT/OT.  3.  CKD stage IIIb/metabolic acidosis -Patient noted to have a nonanion gap metabolic acidosis with evidence of hyperchloremia on admission. - Increase bicarb tablets to 1300 mg twice daily. - Renal function stable at baseline. -Discussed with patient's primary nephrologist, Dr. Dennise. - Outpatient follow-up with nephrology.  4.  Well-controlled diabetes mellitus type 2 -Hemoglobin A1c 6.2 (02/26/2024). -CBG 84 this morning - Patient noted to be on Jardiance  prior to admission which is currently on hold. - SSI.  5.  GERD -Patient presented with hypomagnesemia and hypokalemia. - Patient noted to be on omeprazole  in the outpatient setting which we will discontinue. - Continue Pepcid .   6.  Hypertension - Continue metoprolol . - Continue to hold diltiazem .  - Follow.  7.  Moderate persistent asthma -Patient noted to be on Trelegy and albuterol  in the outpatient setting. - Continue Trelegy.  8.  Diarrhea -Patient with noted multiple watery stools overnight and this morning per patient with large stool having redness to it. - Check for C. difficile PCR. - Check a GI pathogen panel. - Repeat CBC this afternoon. - Supportive care.   DVT prophylaxis:  Heparin  Code Status: Full Family Communication: Updated patient.  No family at bedside. Disposition: Likely home when clinically improved.   Status is: Inpatient The patient will require care spanning > 2 midnights and should be moved to inpatient because: Severity of illness.   Consultants:  None  Procedures:   Chest x-ray 05/13/2024   Antimicrobials:  Anti-infectives (From admission, onward)    None         Subjective: Sitting up in recliner getting ready to eat her lunch.  Patient states minimal improvement with weakness and fatigue.  Denies any chest pain or shortness of breath.  No abdominal pain.  Noted to have multiple bouts of watery loose stools overnight.  States she had some redness with last bowel movement.  Objective: Vitals:   05/14/24 0423 05/14/24 0843 05/14/24 1006 05/14/24 1325  BP:   123/62 119/70  Pulse:  77 76 76  Resp:    18  Temp:  98.6 F (37 C)  98.5 F (36.9 C)  TempSrc:  Oral  Oral  SpO2:  100%  99%  Weight: 76 kg     Height:       No intake or output data in the 24 hours ending 05/14/24 1412  Filed Weights   05/12/24 1722 05/14/24 0423  Weight: 78.5 kg 76 kg    Examination:  General exam: NAD. Respiratory system: CTAB.  No wheezes, no crackles, no rhonchi.  Fair air movement.  Speaking in full sentences. Cardiovascular system: Regular rate rhythm no murmurs rubs or gallops.  No JVD.  No lower extremity edema. Gastrointestinal system: Abdomen is soft, nontender, nondistended, positive bowel sounds.  No rebound.  No guarding. Central nervous system: Alert and oriented.  Moving extremities spontaneously.  No focal neurological deficits. Extremities: Symmetric 5 x 5 power. Skin: No rashes, lesions or ulcers Psychiatry: Judgement and insight appear normal. Mood & affect appropriate.     Data Reviewed: I have personally reviewed following labs and imaging studies  CBC: Recent Labs  Lab 05/12/24 0938 05/12/24 1746 05/13/24 0500 05/14/24 0403  WBC 9.7 10.0 7.9 7.5  NEUTROABS 7.4  --  5.8  --   HGB 10.0* 10.1* 9.1* 7.9*  HCT 30.7* 31.9* 28.5* 26.3*  MCV 82.3 85.1 85.1 85.7  PLT 443.0* 495* 434* 424*    Basic Metabolic Panel: Recent Labs  Lab 05/12/24 0938 05/12/24 1746 05/12/24 1808 05/13/24 0500 05/13/24 2103 05/14/24 0403  NA  142 143  --  144 141 141  K 2.3* 2.7*  --  2.9* 3.1* 2.8*  CL 112 112*  --  115* 114* 117*  CO2 17* 16*  --  16* 15* 15*  GLUCOSE 105* 98  --  90 110* 87  BUN 12 12  --  11 11 10   CREATININE 1.88* 1.84*  --  1.71* 1.51* 1.41*  CALCIUM  7.7* 7.9*  --  7.7* 8.6* 8.0*  MG  --   --  1.3* 2.8*  --  2.3  PHOS  --   --   --   --   --  2.9    GFR: Estimated Creatinine Clearance: 35.2 mL/min (A) (by C-G formula based on SCr of 1.41 mg/dL (H)).  Liver Function Tests: Recent Labs  Lab 05/12/24 0938 05/13/24 0500 05/14/24 0403  AST 16 17  --   ALT 13 9  --   ALKPHOS 81 87  --   BILITOT 0.3 0.2  --   PROT 6.0 5.3*  --   ALBUMIN 2.9*  2.6* 2.2*    CBG: Recent Labs  Lab 05/13/24 1441 05/13/24 1944 05/13/24 2101 05/14/24 0752 05/14/24 1122  GLUCAP 112* 127* 109* 84 119*     No results found for this or any previous visit (from the past 240 hours).       Radiology Studies: DG Chest Port 1 View Result Date: 05/13/2024 EXAM: 1 VIEW(S) XRAY OF THE CHEST 05/13/2024 07:17:00 AM COMPARISON: 02/18/2024 CLINICAL HISTORY: Generalized weakness 412262. Weakness FINDINGS: LUNGS AND PLEURA: Lower lung volumes. No focal pulmonary opacity. No pulmonary edema. No pleural effusion. No pneumothorax. HEART AND MEDIASTINUM: No acute abnormality of the cardiac and mediastinal silhouettes. BONES AND SOFT TISSUES: No acute osseous abnormality. IMPRESSION: 1. No acute cardiopulmonary process. Electronically signed by: Waddell Calk MD 05/13/2024 07:21 AM EDT RP Workstation: [id number]        Scheduled Meds:  allopurinol   100 mg Oral Daily   budesonide -glycopyrrolate -formoterol   2 puff Inhalation BID   citalopram   40 mg Oral Daily   famotidine   20 mg Oral BID   heparin  injection (subcutaneous)  5,000 Units Subcutaneous Q8H   insulin  aspart  0-5 Units Subcutaneous QHS   insulin  aspart  0-6 Units Subcutaneous TID WC   latanoprost   1 drop Left Eye QHS   loratadine  10 mg Oral Daily   metoprolol   succinate  100 mg Oral Daily   [START ON 05/15/2024] potassium chloride   20 mEq Oral Daily   potassium chloride   40 mEq Oral Q4H   rosuvastatin   40 mg Oral Daily   sodium bicarbonate   1,300 mg Oral BID   Continuous Infusions:  lactated ringers  75 mL/hr at 05/14/24 1021     LOS: 1 day    Time spent: 40 minutes    Toribio Hummer, MD [facility]   To contact the attending provider between 7A-7P or the covering provider during after hours 7P-7A, please log into the web site [email or web address] and access using universal [place] password for that web site. If you do not have the password, please call the hospital operator.  05/14/2024, 2:12 PM

## 2024-05-15 ENCOUNTER — Ambulatory Visit: Admitting: Podiatry

## 2024-05-15 DIAGNOSIS — E876 Hypokalemia: Secondary | ICD-10-CM | POA: Diagnosis not present

## 2024-05-15 DIAGNOSIS — K219 Gastro-esophageal reflux disease without esophagitis: Secondary | ICD-10-CM | POA: Diagnosis not present

## 2024-05-15 DIAGNOSIS — N1832 Chronic kidney disease, stage 3b: Secondary | ICD-10-CM | POA: Diagnosis not present

## 2024-05-15 LAB — CBC WITH DIFFERENTIAL/PLATELET
Abs Immature Granulocytes: 0.06 K/uL (ref 0.00–0.07)
Basophils Absolute: 0 K/uL (ref 0.0–0.1)
Basophils Relative: 0 %
Eosinophils Absolute: 0 K/uL (ref 0.0–0.5)
Eosinophils Relative: 0 %
HCT: 25.9 % — ABNORMAL LOW (ref 36.0–46.0)
Hemoglobin: 8.2 g/dL — ABNORMAL LOW (ref 12.0–15.0)
Immature Granulocytes: 1 %
Lymphocytes Relative: 25 %
Lymphs Abs: 1.8 K/uL (ref 0.7–4.0)
MCH: 26.9 pg (ref 26.0–34.0)
MCHC: 31.7 g/dL (ref 30.0–36.0)
MCV: 84.9 fL (ref 80.0–100.0)
Monocytes Absolute: 0.6 K/uL (ref 0.1–1.0)
Monocytes Relative: 8 %
Neutro Abs: 4.7 K/uL (ref 1.7–7.7)
Neutrophils Relative %: 66 %
Platelets: 421 K/uL — ABNORMAL HIGH (ref 150–400)
RBC: 3.05 MIL/uL — ABNORMAL LOW (ref 3.87–5.11)
RDW: 19.8 % — ABNORMAL HIGH (ref 11.5–15.5)
WBC: 7.1 K/uL (ref 4.0–10.5)
nRBC: 0 % (ref 0.0–0.2)

## 2024-05-15 LAB — RENAL FUNCTION PANEL
Albumin: 2.2 g/dL — ABNORMAL LOW (ref 3.5–5.0)
Anion gap: 8 (ref 5–15)
BUN: 8 mg/dL (ref 8–23)
CO2: 18 mmol/L — ABNORMAL LOW (ref 22–32)
Calcium: 8.2 mg/dL — ABNORMAL LOW (ref 8.9–10.3)
Chloride: 117 mmol/L — ABNORMAL HIGH (ref 98–111)
Creatinine, Ser: 1.28 mg/dL — ABNORMAL HIGH (ref 0.44–1.00)
GFR, Estimated: 44 mL/min — ABNORMAL LOW (ref >60)
Glucose, Bld: 82 mg/dL (ref 70–99)
Phosphorus: 2 mg/dL — ABNORMAL LOW (ref 2.5–4.6)
Potassium: 3.6 mmol/L (ref 3.5–5.1)
Sodium: 142 mmol/L (ref 135–145)

## 2024-05-15 LAB — GLUCOSE, CAPILLARY
Glucose-Capillary: 88 mg/dL (ref 70–99)
Glucose-Capillary: 105 mg/dL — ABNORMAL HIGH (ref 70–99)
Glucose-Capillary: 105 mg/dL — ABNORMAL HIGH (ref 70–99)
Glucose-Capillary: 106 mg/dL — ABNORMAL HIGH (ref 70–99)

## 2024-05-15 LAB — GASTROINTESTINAL PANEL BY PCR, STOOL (REPLACES STOOL CULTURE)

## 2024-05-15 LAB — MAGNESIUM: Magnesium: 2 mg/dL (ref 1.7–2.4)

## 2024-05-15 MED ORDER — K PHOS MONO-SOD PHOS DI & MONO 155-852-130 MG PO TABS
250.0000 mg | ORAL_TABLET | Freq: Two times a day (BID) | ORAL | Status: DC
  Administered 2024-05-15 – 2024-05-17 (×5): 250 mg via ORAL
  Filled 2024-05-15 (×5): qty 1

## 2024-05-15 MED ORDER — LOPERAMIDE HCL 2 MG PO CAPS
2.0000 mg | ORAL_CAPSULE | ORAL | Status: DC | PRN
  Administered 2024-05-16 (×2): 2 mg via ORAL
  Filled 2024-05-15 (×2): qty 1

## 2024-05-15 NOTE — Progress Notes (Signed)
 PROGRESS NOTE    Brooke [name]  FMW:[id number] DOB: 1951/05/03 DOA: 05/12/2024 PCP: Norleen Lynwood ORN, MD    Chief Complaint  Patient presents with   Abnormal Lab    Brief Narrative:  Patient pleasant 73 year old female history of type 2 diabetes, CKD 3B with baseline creatinine 1.5-1.9, GERD, hypertension, moderate persistent asthma presented to the ED with hypokalemia noted on lab work at PCPs office on hospital follow-up and sent to the ED for further evaluation.  Patient noted with complaints of generalized weakness and fatigue.  Patient noted on presentation to be hypokalemic and hypomagnesemic.    Assessment & Plan:   Principal Problem:   Hypokalemia Active Problems:   DM2 (diabetes mellitus, type 2) (HCC)   GERD (gastroesophageal reflux disease)   CKD stage 3b, GFR 30-44 ml/min (HCC)   Generalized weakness   Hypomagnesemia   Renal tubular acidosis   History of essential hypertension   Moderate persistent asthma   Diarrhea   Hypophosphatemia   #1 hypokalemia/hypomagnesemia -Patient on presentation noted to have a potassium of 2.7 and a magnesium  level of 1.3. -Patient noted to have multiple watery loose stools overnight.  -Likely etiology of electrolyte abnormalities as patient has diarrhea. - Patient with complaints of generalized weakness and fatigue. - Patient noted to be on omeprazole  in the outpatient setting, ophthalmic dorzolamide  as well as beta-2 agonist including Trelegy as well as as needed albuterol . - Patient noted to have received 40 mEq of oral potassium as well as 10 mEq of IV potassium in the ED and given an additional 40 mill equivalents of oral potassium x 2 on 05/13/2024. - Potassium noted at 3.6 this morning, magnesium  at 2.0, phosphorus at 2.0.  - Continue home regimen of potassium 20 mEq daily. -Saline lock IV fluids. -Repeat labs in the AM.  2.  Generalized weakness -Patient with complaints of 4 to 5 days of generalized weakness in the absence  of any focal weakness. - Generalized weakness could be likely secondary to electrolyte abnormalities in the setting of GI losses of diarrhea. -Slowly improving. - Urinalysis nitrite negative, leukocytes negative. - Chest x-ray done negative for any acute abnormalities. - TSH within normal limits at 2.0. - Replete electrolytes. - PT/OT.  3.  CKD stage IIIb/metabolic acidosis -Patient noted to have a nonanion gap metabolic acidosis with evidence of hyperchloremia on admission. -Bicarb improving. -Continue bicarb tablets 1300 mg twice daily. -Renal function stable. - Saline lock IV fluids. -Discussed with patient's primary nephrologist, Dr. Dennise. - Outpatient follow-up with nephrology.  4.  Well-controlled diabetes mellitus type 2 -Hemoglobin A1c 6.2 (02/26/2024). -CBG 88 this morning - Patient noted to be on Jardiance  prior to admission which is currently on hold. - Continue SSI.  5.  GERD -Patient presented with hypomagnesemia and hypokalemia. - Patient noted to be on omeprazole  in the outpatient setting which has been discontinued.   - Pepcid .    6.  Hypertension - BP currently controlled  - Continue metoprolol .   - Diltiazem  on hold.   - Follow.  7.  Moderate persistent asthma -Patient noted to be on Trelegy and albuterol  in the outpatient setting. - Continue Trelegy.  8.  Diarrhea -Patient with noted multiple watery stools over the past 24 to 48 hours.  -Patient with 2 watery loose stools this morning. -C. difficile PCR negative. -GI pathogen panel negative. -Imodium as needed.  9.  Hypophosphatemia -Phosphorus at 2.0. - K-Phos 250 mg p.o. twice daily x 3 days.   DVT prophylaxis: Heparin   Code Status: Full Family Communication: Updated patient.  No family at bedside. Disposition: Likely home when clinically improved hopefully in the next 24 to 48 hours.   Status is: Inpatient The patient will require care spanning > 2 midnights and should be moved to inpatient  because: Severity of illness.   Consultants:  None  Procedures:  Chest x-ray 05/13/2024   Antimicrobials:  Anti-infectives (From admission, onward)    None         Subjective: Patient sitting up in recliner.  Denies any chest pain or shortness of breath.  Denies any abdominal pain.  Stated having loose watery stools and had 2 this morning.  States worked with physical therapy today and ambulated in hallway.  Still with complaints of generalized weakness and fatigue.  Objective: Vitals:   05/15/24 0601 05/15/24 0833 05/15/24 0920 05/15/24 1354  BP: 136/68  130/66 112/64  Pulse: 82  81 78  Resp: 18     Temp: 98.7 F (37.1 C)   98.6 F (37 C)  TempSrc: Oral   Oral  SpO2: 98% 100%  100%  Weight:      Height:        Intake/Output Summary (Last 24 hours) at 05/15/2024 1746 Last data filed at 05/15/2024 1342 Gross per 24 hour  Intake 2985.82 ml  Output 300 ml  Net 2685.82 ml    Filed Weights   05/12/24 1722 05/14/24 0423 05/15/24 0500  Weight: 78.5 kg 76 kg 75.2 kg    Examination:  General exam: NAD. Respiratory system: Lungs clear to auscultation bilaterally.  No wheezes, no crackles, no rhonchi.  Fair air movement.  Speaking in full sentences.  Cardiovascular system: RRR no murmurs rubs or gallops.  No JVD.  No pitting lower extremity edema.  Gastrointestinal system: Abdomen is soft, nontender, nondistended, positive bowel sounds.  No rebound.  No guarding.  Central nervous system: Alert and oriented.  Moving extremities spontaneously.  No focal neurological deficits. Extremities: Symmetric 5 x 5 power. Skin: No rashes, lesions or ulcers Psychiatry: Judgement and insight appear normal. Mood & affect appropriate.     Data Reviewed: I have personally reviewed following labs and imaging studies  CBC: Recent Labs  Lab 05/12/24 0938 05/12/24 1746 05/13/24 0500 05/14/24 0403 05/14/24 1645 05/15/24 0350  WBC 9.7 10.0 7.9 7.5 6.5 7.1  NEUTROABS 7.4  --   5.8  --   --  4.7  HGB 10.0* 10.1* 9.1* 7.9* 8.6* 8.2*  HCT 30.7* 31.9* 28.5* 26.3* 28.0* 25.9*  MCV 82.3 85.1 85.1 85.7 86.2 84.9  PLT 443.0* 495* 434* 424* 387 421*    Basic Metabolic Panel: Recent Labs  Lab 05/12/24 1808 05/13/24 0500 05/13/24 2103 05/14/24 0403 05/14/24 1645 05/15/24 0350  NA  --  144 141 141 139 142  K  --  2.9* 3.1* 2.8* 3.8 3.6  CL  --  115* 114* 117* 114* 117*  CO2  --  16* 15* 15* 16* 18*  GLUCOSE  --  90 110* 87 85 82  BUN  --  11 11 10 9 8   CREATININE  --  1.71* 1.51* 1.41* 1.44* 1.28*  CALCIUM   --  7.7* 8.6* 8.0* 8.3* 8.2*  MG 1.3* 2.8*  --  2.3  --  2.0  PHOS  --   --   --  2.9  --  2.0*    GFR: Estimated Creatinine Clearance: 38.6 mL/min (A) (by C-G formula based on SCr of 1.28 mg/dL (H)).  Liver Function Tests:  Recent Labs  Lab 05/12/24 0938 05/13/24 0500 05/14/24 0403 05/15/24 0350  AST 16 17  --   --   ALT 13 9  --   --   ALKPHOS 81 87  --   --   BILITOT 0.3 0.2  --   --   PROT 6.0 5.3*  --   --   ALBUMIN 2.9* 2.6* 2.2* 2.2*    CBG: Recent Labs  Lab 05/14/24 1628 05/14/24 2135 05/15/24 0734 05/15/24 1119 05/15/24 1712  GLUCAP 89 111* 88 105* 105*     Recent Results (from the past 240 hours)  C Difficile Quick Screen w PCR reflex     Status: None   Collection Time: 05/14/24  9:32 PM   Specimen: STOOL  Result Value Ref Range Status   C Diff antigen NEGATIVE NEGATIVE Final   C Diff toxin NEGATIVE NEGATIVE Final   C Diff interpretation No C. difficile detected.  Final    Comment: Performed at [facility], [address], [place], [place] [address]  Gastrointestinal Panel by PCR , Stool     Status: None   Collection Time: 05/14/24  9:32 PM   Specimen: STOOL  Result Value Ref Range Status   Campylobacter species NOT DETECTED NOT DETECTED Final   Plesimonas shigelloides NOT DETECTED NOT DETECTED Final   Salmonella species NOT DETECTED NOT DETECTED Final   Yersinia enterocolitica NOT DETECTED NOT  DETECTED Final   Vibrio species NOT DETECTED NOT DETECTED Final   Vibrio cholerae NOT DETECTED NOT DETECTED Final   Enteroaggregative E coli (EAEC) NOT DETECTED NOT DETECTED Final   Enteropathogenic E coli (EPEC) NOT DETECTED NOT DETECTED Final   Enterotoxigenic E coli (ETEC) NOT DETECTED NOT DETECTED Final   Shiga like toxin producing E coli (STEC) NOT DETECTED NOT DETECTED Final   Shigella/Enteroinvasive E coli (EIEC) NOT DETECTED NOT DETECTED Final   Cryptosporidium NOT DETECTED NOT DETECTED Final   Cyclospora cayetanensis NOT DETECTED NOT DETECTED Final   Entamoeba histolytica NOT DETECTED NOT DETECTED Final   Giardia lamblia NOT DETECTED NOT DETECTED Final   Adenovirus F40/41 NOT DETECTED NOT DETECTED Final   Astrovirus NOT DETECTED NOT DETECTED Final   Norovirus GI/GII NOT DETECTED NOT DETECTED Final   Rotavirus A NOT DETECTED NOT DETECTED Final   Sapovirus (I, II, IV, and V) NOT DETECTED NOT DETECTED Final    Comment: Performed at [facility], [address] [place], [place] [address]         Radiology Studies: No results found.       Scheduled Meds:  allopurinol   100 mg Oral Daily   budesonide -glycopyrrolate -formoterol   2 puff Inhalation BID   citalopram   40 mg Oral Daily   famotidine   20 mg Oral BID   heparin  injection (subcutaneous)  5,000 Units Subcutaneous Q8H   insulin  aspart  0-5 Units Subcutaneous QHS   insulin  aspart  0-6 Units Subcutaneous TID WC   latanoprost   1 drop Left Eye QHS   loratadine  10 mg Oral Daily   metoprolol  succinate  100 mg Oral Daily   phosphorus  250 mg Oral BID   potassium chloride   20 mEq Oral Daily   rosuvastatin   40 mg Oral Daily   sodium bicarbonate   1,300 mg Oral BID   Continuous Infusions:     LOS: 2 days    Time spent: 35 minutes    Toribio Hummer, MD [facility]   To contact the attending provider between 7A-7P or  the covering provider during after hours 7P-7A, please log into the web site  [email or web address] and access using universal [place] password for that web site. If you do not have the password, please call the hospital operator.  05/15/2024, 5:46 PM

## 2024-05-15 NOTE — Progress Notes (Signed)
 Physical Therapy Treatment Patient Details Name: Brooke [name] MRN: [id number] DOB: Jun 07, 1951 Today's Date: 05/15/2024   History of Present Illness Brooke [name] Patella is a 73 y/o F admitted 05/12/24 with hypokalemia from her PCP during a follow up after a recent hospitalization in the [facility] system from 04/29/2024 to 05/05/2024 for COVID, chest pain and ileus. PMH includes Alopecia, Anxiety, Aortic atherosclerosis, Asthma, Gout, Heart murmur, syncope (2015), HLD, HTN, Hypothyroidism, OA (osteoarthritis) of knee, PVC's, T2DM, Ventricular tachycardia, left heart catheterization with coronary angiogram (01/07/2014); Renal artery stent (Left, 2005, 2006); Abdominal hysterectomy (2001); and TKA 2019/2020.    PT Comments  PT - Cognition Comments: AxO x 3 pleasant and willing.  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  Feeling a little better and the diarhhea has slowed down.  Pt self able to get OOB/back into bed.  General transfer comment: self able with good safety cognition and use of hands to steady self.  Pt has Indep transfering on/off near by [facility] without assist.  Performing self peri care and hygiene at sink that is also close by. General Gait Details: tolerated an increased distance with walker which she uses at home.  Gait is slow with slightly forward flex posture.  Increased c/o fatigue with increased distance.  Increased c/o LEFT hip with increased actvity.  No overt LOB.  Functional distance.  Pt plans to return home.  LPT has rec HH PT.  No equipment.     If plan is discharge home, recommend the following: A little help with bathing/dressing/bathroom;Help with stairs or ramp for entrance;Assist for transportation;Assistance with cooking/housework   Can travel by private vehicle        Equipment Recommendations  None recommended by PT    Recommendations for Other Services       Precautions / Restrictions Precautions Precautions: Fall Restrictions Weight Bearing Restrictions Per  Provider Order: No     Mobility  Bed Mobility Overal bed mobility: Modified Independent             General bed mobility comments: self able    Transfers Overall transfer level: Modified independent Equipment used: Rolling walker (2 wheels), None Transfers: Sit to/from Stand Sit to Stand: Modified independent (Device/Increase time)           General transfer comment: self able with good safety cognition and use of hands to steady self.  Pt has Indep transfering on/off near by [facility] without assist.  Performing self peri care and hygiene at sink that is also close by.    Ambulation/Gait Ambulation/Gait assistance: Supervision Gait Distance (Feet): 110 Feet   Gait Pattern/deviations: Step-through pattern, Decreased step length - left, Decreased step length - right, Decreased stride length Gait velocity: decr     General Gait Details: tolerated an increased distance with walker which she uses at home.  Gait is slow with slightly forward flex posture.  Increased c/o fatigue with increased distance.  Increased c/o LEFT hip with increased actvity.  No overt LOB.  Functional distance.   Stairs             Wheelchair Mobility     Tilt Bed    Modified Rankin (Stroke Patients Only)       Balance                                            Communication Communication Communication:  No apparent difficulties  Cognition Arousal: Alert Behavior During Therapy: WFL for tasks assessed/performed   PT - Cognitive impairments: No apparent impairments                       PT - Cognition Comments: AxO x 3 pleasant and willing.  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  Feeling a little better and the diarhhea has slowed down. Following commands: Intact      Cueing Cueing Techniques: Verbal cues  Exercises      General Comments        Pertinent Vitals/Pain Pain Assessment Pain Assessment: Faces Faces Pain Scale: Hurts a little  bit Pain Location: hips Pain Descriptors / Indicators: Constant, Discomfort Pain Intervention(s): Monitored during session    Home Living                          Prior Function            PT Goals (current goals can now be found in the care plan section) Progress towards PT goals: Progressing toward goals    Frequency    Min 2X/week      PT Plan      Co-evaluation              AM-PAC PT 6 Clicks Mobility   Outcome Measure  Help needed turning from your back to your side while in a flat bed without using bedrails?: None Help needed moving from lying on your back to sitting on the side of a flat bed without using bedrails?: None Help needed moving to and from a bed to a chair (including a wheelchair)?: None Help needed standing up from a chair using your arms (e.g., wheelchair or bedside chair)?: None Help needed to walk in hospital room?: None Help needed climbing 3-5 steps with a railing? : A Little 6 Click Score: 23    End of Session Equipment Utilized During Treatment: Gait belt Activity Tolerance: Patient limited by fatigue Patient left: in chair;with call bell/phone within reach;with chair alarm set Nurse Communication: Mobility status PT Visit Diagnosis: Difficulty in walking, not elsewhere classified (R26.2);Muscle weakness (generalized) (M62.81);Unsteadiness on feet (R26.81)     Time: 8983-8968 PT Time Calculation (min) (ACUTE ONLY): 15 min  Charges:    $Gait Training: 8-22 mins PT General Charges $$ ACUTE PT VISIT: 1 Visit                    Katheryn Leap  PTA Acute  Rehabilitation Services Office M-F          [phone]

## 2024-05-16 DIAGNOSIS — N1832 Chronic kidney disease, stage 3b: Secondary | ICD-10-CM | POA: Diagnosis not present

## 2024-05-16 DIAGNOSIS — E876 Hypokalemia: Secondary | ICD-10-CM | POA: Diagnosis not present

## 2024-05-16 DIAGNOSIS — K219 Gastro-esophageal reflux disease without esophagitis: Secondary | ICD-10-CM | POA: Diagnosis not present

## 2024-05-16 LAB — GLUCOSE, CAPILLARY
Glucose-Capillary: 81 mg/dL (ref 70–99)
Glucose-Capillary: 103 mg/dL — ABNORMAL HIGH (ref 70–99)
Glucose-Capillary: 104 mg/dL — ABNORMAL HIGH (ref 70–99)
Glucose-Capillary: 87 mg/dL (ref 70–99)

## 2024-05-16 LAB — RENAL FUNCTION PANEL
Albumin: 2.2 g/dL — ABNORMAL LOW (ref 3.5–5.0)
Anion gap: 9 (ref 5–15)
BUN: 6 mg/dL — ABNORMAL LOW (ref 8–23)
CO2: 18 mmol/L — ABNORMAL LOW (ref 22–32)
Calcium: 8.1 mg/dL — ABNORMAL LOW (ref 8.9–10.3)
Chloride: 116 mmol/L — ABNORMAL HIGH (ref 98–111)
Creatinine, Ser: 1.31 mg/dL — ABNORMAL HIGH (ref 0.44–1.00)
GFR, Estimated: 43 mL/min — ABNORMAL LOW (ref >60)
Glucose, Bld: 80 mg/dL (ref 70–99)
Phosphorus: 3.1 mg/dL (ref 2.5–4.6)
Potassium: 3.1 mmol/L — ABNORMAL LOW (ref 3.5–5.1)
Sodium: 143 mmol/L (ref 135–145)

## 2024-05-16 MED ORDER — POTASSIUM CHLORIDE CRYS ER 10 MEQ PO TBCR
40.0000 meq | EXTENDED_RELEASE_TABLET | ORAL | Status: AC
  Administered 2024-05-16 (×2): 40 meq via ORAL
  Filled 2024-05-16 (×2): qty 4

## 2024-05-16 MED ORDER — POTASSIUM CHLORIDE 20 MEQ PO PACK: 20.0000 meq | PACK | Freq: Every day | ORAL | Status: DC

## 2024-05-16 MED ORDER — POTASSIUM CHLORIDE 20 MEQ PO PACK
40.0000 meq | PACK | Freq: Every day | ORAL | Status: DC
  Administered 2024-05-17: 40 meq via ORAL
  Filled 2024-05-16: qty 2

## 2024-05-16 NOTE — Plan of Care (Signed)
  Problem: Fluid Volume: Goal: Ability to maintain a balanced intake and output will improve Outcome: Progressing   Problem: Health Behavior/Discharge Planning: Goal: Ability to manage health-related needs will improve Outcome: Progressing   Problem: Nutritional: Goal: Maintenance of adequate nutrition will improve Outcome: Progressing   Problem: Skin Integrity: Goal: Risk for impaired skin integrity will decrease Outcome: Progressing   Problem: Education: Goal: Knowledge of General Education information will improve Description: Including pain rating scale, medication(s)/side effects and non-pharmacologic comfort measures Outcome: Progressing   Problem: Clinical Measurements: Goal: Will remain free from infection Outcome: Progressing Goal: Respiratory complications will improve Outcome: Progressing   Problem: Nutrition: Goal: Adequate nutrition will be maintained Outcome: Progressing   Problem: Pain Managment: Goal: General experience of comfort will improve and/or be controlled Outcome: Progressing   Problem: Skin Integrity: Goal: Risk for impaired skin integrity will decrease Outcome: Progressing

## 2024-05-16 NOTE — Progress Notes (Signed)
 PROGRESS NOTE    Brooke [name]  FMW:[id number] DOB: 1950-11-14 DOA: 05/12/2024 PCP: Norleen Lynwood ORN, MD    Chief Complaint  Patient presents with   Abnormal Lab    Brief Narrative:  Patient pleasant 73 year old female history of type 2 diabetes, CKD 3B with baseline creatinine 1.5-1.9, GERD, hypertension, moderate persistent asthma presented to the ED with hypokalemia noted on lab work at PCPs office on hospital follow-up and sent to the ED for further evaluation.  Patient noted with complaints of generalized weakness and fatigue.  Patient noted on presentation to be hypokalemic and hypomagnesemic.    Assessment & Plan:   Principal Problem:   Hypokalemia Active Problems:   DM2 (diabetes mellitus, type 2) (HCC)   GERD (gastroesophageal reflux disease)   CKD stage 3b, GFR 30-44 ml/min (HCC)   Generalized weakness   Hypomagnesemia   Renal tubular acidosis   History of essential hypertension   Moderate persistent asthma   Diarrhea   Hypophosphatemia   #1 hypokalemia/hypomagnesemia -Patient on presentation noted to have a potassium of 2.7 and a magnesium  level of 1.3. -Patient noted to have multiple watery loose stools which is slowly improving.  -Likely etiology of electrolyte abnormalities as patient has diarrhea. - Patient with complaints of generalized weakness and fatigue. - Patient noted to be on omeprazole  in the outpatient setting, ophthalmic dorzolamide  as well as beta-2 agonist including Trelegy as well as as needed albuterol . - Patient noted to have received 40 mEq of oral potassium as well as 10 mEq of IV potassium in the ED and given an additional 40 mill equivalents of oral potassium x 2 on 05/13/2024. - Potassium noted at 3.1 this morning, magnesium  at 2.0, phosphorus at 3.1. - K-Dur 40 mEq p.o. every 4 hours x 2 doses. - Would likely need to go home on K-Dur 40 mEq daily until follow-up with nephrology. - IV fluids discontinued. - Repeat labs in the AM.  2.   Generalized weakness -Patient with complaints of 4 to 5 days of generalized weakness in the absence of any focal weakness. - Generalized weakness could be likely secondary to electrolyte abnormalities in the setting of GI losses of diarrhea. -Improving slowly. - Urinalysis nitrite negative, leukocytes negative. - Chest x-ray done negative for any acute abnormalities. - TSH within normal limits at 2.0. - Replete electrolytes. - PT/OT.  3.  CKD stage IIIb/metabolic acidosis -Patient noted to have a nonanion gap metabolic acidosis with evidence of hyperchloremia on admission. -Acidosis improving. -Continue bicarb tablets 1300 mg twice daily. -Renal function stable. - Saline lock IV fluids. -Discussed with patient's primary nephrologist, Dr. Dennise. - Outpatient follow-up with nephrology.  4.  Well-controlled diabetes mellitus type 2 -Hemoglobin A1c 6.2 (02/26/2024). -CBG 81 this morning - Patient noted to be on Jardiance  prior to admission which is currently on hold. - Continue SSI.  5.  GERD -Patient presented with hypomagnesemia and hypokalemia. - Patient noted to be on omeprazole  in the outpatient setting which has been discontinued.   - Pepcid .    6.  Hypertension - BP currently controlled  - Continue metoprolol .   - Continue to hold diltiazem .   - Follow.  7.  Moderate persistent asthma -Patient noted to be on Trelegy and albuterol  in the outpatient setting. - Continue Trelegy.  8.  Diarrhea -Patient with noted multiple watery stools over the past 24 to 48 hours.  -Patient with 2 watery loose stools this morning. -C. difficile PCR negative. -GI pathogen panel negative. -Imodium as needed.  9.  Hypophosphatemia - Repleted.   - Continue K-Phos for another 24 hours and discontinue.    DVT prophylaxis: Heparin  Code Status: Full Family Communication: Updated patient.  No family at bedside. Disposition: Likely home when clinically improved hopefully in the next 24 to  48 hours.   Status is: Inpatient The patient will require care spanning > 2 midnights and should be moved to inpatient because: Severity of illness.   Consultants:  Curb sided nephrology: Dr. Dennise  Procedures:  Chest x-ray 05/13/2024   Antimicrobials:  Anti-infectives (From admission, onward)    None         Subjective: Patient sitting up in recliner.  Still with complaints of weakness but improved since admission.  Stated ambulated in hallway today.  Denies any chest pain or shortness of breath.  Denies any abdominal pain.  Still having some loose stools but consistency improving.  Objective: Vitals:   05/15/24 2046 05/16/24 0500 05/16/24 0600 05/16/24 1316  BP:   134/69 117/69  Pulse:   82 79  Resp:   16 17  Temp:   98.9 F (37.2 C) 98.2 F (36.8 C)  TempSrc:   Oral Oral  SpO2: 100%  100% 100%  Weight:  76.1 kg    Height:        Intake/Output Summary (Last 24 hours) at 05/16/2024 1354 Last data filed at 05/16/2024 1255 Gross per 24 hour  Intake 510 ml  Output --  Net 510 ml    Filed Weights   05/14/24 0423 05/15/24 0500 05/16/24 0500  Weight: 76 kg 75.2 kg 76.1 kg    Examination:  General exam: NAD. Respiratory system: CTAB.  No wheezes, no crackles, no rhonchi.  Fair air movement.  Speaking in full sentences.  Cardiovascular system: Regular rate and rhythm no murmurs rubs or gallops.  No JVD.  No lower extremity edema.  Gastrointestinal system: Abdomen is soft, nontender, nondistended, positive bowel sounds.  No rebound.  No guarding.  Central nervous system: Alert and oriented.  Moving extremities spontaneously.  No focal neurological deficits. Extremities: Symmetric 5 x 5 power. Skin: No rashes, lesions or ulcers Psychiatry: Judgement and insight appear normal. Mood & affect appropriate.     Data Reviewed: I have personally reviewed following labs and imaging studies  CBC: Recent Labs  Lab 05/12/24 0938 05/12/24 1746 05/13/24 0500  05/14/24 0403 05/14/24 1645 05/15/24 0350  WBC 9.7 10.0 7.9 7.5 6.5 7.1  NEUTROABS 7.4  --  5.8  --   --  4.7  HGB 10.0* 10.1* 9.1* 7.9* 8.6* 8.2*  HCT 30.7* 31.9* 28.5* 26.3* 28.0* 25.9*  MCV 82.3 85.1 85.1 85.7 86.2 84.9  PLT 443.0* 495* 434* 424* 387 421*    Basic Metabolic Panel: Recent Labs  Lab 05/12/24 1808 05/13/24 0500 05/13/24 2103 05/14/24 0403 05/14/24 1645 05/15/24 0350 05/16/24 0416  NA  --  144 141 141 139 142 143  K  --  2.9* 3.1* 2.8* 3.8 3.6 3.1*  CL  --  115* 114* 117* 114* 117* 116*  CO2  --  16* 15* 15* 16* 18* 18*  GLUCOSE  --  90 110* 87 85 82 80  BUN  --  11 11 10 9 8  6*  CREATININE  --  1.71* 1.51* 1.41* 1.44* 1.28* 1.31*  CALCIUM   --  7.7* 8.6* 8.0* 8.3* 8.2* 8.1*  MG 1.3* 2.8*  --  2.3  --  2.0  --   PHOS  --   --   --  2.9  --  2.0* 3.1    GFR: Estimated Creatinine Clearance: 37.9 mL/min (A) (by C-G formula based on SCr of 1.31 mg/dL (H)).  Liver Function Tests: Recent Labs  Lab 05/12/24 0938 05/13/24 0500 05/14/24 0403 05/15/24 0350 05/16/24 0416  AST 16 17  --   --   --   ALT 13 9  --   --   --   ALKPHOS 81 87  --   --   --   BILITOT 0.3 0.2  --   --   --   PROT 6.0 5.3*  --   --   --   ALBUMIN 2.9* 2.6* 2.2* 2.2* 2.2*    CBG: Recent Labs  Lab 05/15/24 1119 05/15/24 1712 05/15/24 2046 05/16/24 0730 05/16/24 1134  GLUCAP 105* 105* 106* 81 103*     Recent Results (from the past 240 hours)  C Difficile Quick Screen w PCR reflex     Status: None   Collection Time: 05/14/24  9:32 PM   Specimen: STOOL  Result Value Ref Range Status   C Diff antigen NEGATIVE NEGATIVE Final   C Diff toxin NEGATIVE NEGATIVE Final   C Diff interpretation No C. difficile detected.  Final    Comment: Performed at [facility], [address] [place], [place] [address]  Gastrointestinal Panel by PCR , Stool     Status: None   Collection Time: 05/14/24  9:32 PM   Specimen: STOOL  Result Value Ref Range Status    Campylobacter species NOT DETECTED NOT DETECTED Final   Plesimonas shigelloides NOT DETECTED NOT DETECTED Final   Salmonella species NOT DETECTED NOT DETECTED Final   Yersinia enterocolitica NOT DETECTED NOT DETECTED Final   Vibrio species NOT DETECTED NOT DETECTED Final   Vibrio cholerae NOT DETECTED NOT DETECTED Final   Enteroaggregative E coli (EAEC) NOT DETECTED NOT DETECTED Final   Enteropathogenic E coli (EPEC) NOT DETECTED NOT DETECTED Final   Enterotoxigenic E coli (ETEC) NOT DETECTED NOT DETECTED Final   Shiga like toxin producing E coli (STEC) NOT DETECTED NOT DETECTED Final   Shigella/Enteroinvasive E coli (EIEC) NOT DETECTED NOT DETECTED Final   Cryptosporidium NOT DETECTED NOT DETECTED Final   Cyclospora cayetanensis NOT DETECTED NOT DETECTED Final   Entamoeba histolytica NOT DETECTED NOT DETECTED Final   Giardia lamblia NOT DETECTED NOT DETECTED Final   Adenovirus F40/41 NOT DETECTED NOT DETECTED Final   Astrovirus NOT DETECTED NOT DETECTED Final   Norovirus GI/GII NOT DETECTED NOT DETECTED Final   Rotavirus A NOT DETECTED NOT DETECTED Final   Sapovirus (I, II, IV, and V) NOT DETECTED NOT DETECTED Final    Comment: Performed at [facility], [address] [place], [place] [address]         Radiology Studies: No results found.       Scheduled Meds:  allopurinol   100 mg Oral Daily   budesonide -glycopyrrolate -formoterol   2 puff Inhalation BID   citalopram   40 mg Oral Daily   famotidine   20 mg Oral BID   heparin  injection (subcutaneous)  5,000 Units Subcutaneous Q8H   insulin  aspart  0-5 Units Subcutaneous QHS   insulin  aspart  0-6 Units Subcutaneous TID WC   latanoprost   1 drop Left Eye QHS   loratadine  10 mg Oral Daily   metoprolol  succinate  100 mg Oral Daily   phosphorus  250 mg Oral BID   [START ON 05/17/2024] potassium chloride   40 mEq Oral Daily  rosuvastatin   40 mg Oral Daily   sodium bicarbonate   1,300 mg Oral BID   Continuous  Infusions:     LOS: 3 days    Time spent: 35 minutes    Toribio Hummer, MD [facility]   To contact the attending provider between 7A-7P or the covering provider during after hours 7P-7A, please log into the web site [email or web address] and access using universal [place] password for that web site. If you do not have the password, please call the hospital operator.  05/16/2024, 1:54 PM

## 2024-05-16 NOTE — Progress Notes (Signed)
 Mobility Specialist - Progress Note   05/16/24 1100  Mobility  Activity Ambulated with assistance  Level of Assistance Standby assist, set-up cues, supervision of patient - no hands on  Assistive Device Front wheel walker  Distance Ambulated (ft) 200 ft  Range of Motion/Exercises Active  Activity Response Tolerated well  Mobility Referral Yes  Mobility visit 1 Mobility  Mobility Specialist Start Time (ACUTE ONLY) 1050  Mobility Specialist Stop Time (ACUTE ONLY) 1100  Mobility Specialist Time Calculation (min) (ACUTE ONLY) 10 min   Pt was found on recliner chair and agreeable to mobilize. Stated feeling fatigued. At EOS returned to recliner chair with all needs met. Call bell in reach.   Erminio Leos,  Mobility Specialist Can be reached via Secure Chat

## 2024-05-17 DIAGNOSIS — R197 Diarrhea, unspecified: Secondary | ICD-10-CM | POA: Diagnosis not present

## 2024-05-17 DIAGNOSIS — Z8679 Personal history of other diseases of the circulatory system: Secondary | ICD-10-CM | POA: Diagnosis not present

## 2024-05-17 DIAGNOSIS — E876 Hypokalemia: Secondary | ICD-10-CM | POA: Diagnosis not present

## 2024-05-17 DIAGNOSIS — N1832 Chronic kidney disease, stage 3b: Secondary | ICD-10-CM | POA: Diagnosis not present

## 2024-05-17 DIAGNOSIS — E872 Acidosis, unspecified: Secondary | ICD-10-CM

## 2024-05-17 LAB — CBC
HCT: 27.2 % — ABNORMAL LOW (ref 36.0–46.0)
Hemoglobin: 8 g/dL — ABNORMAL LOW (ref 12.0–15.0)
MCH: 26 pg (ref 26.0–34.0)
MCHC: 29.4 g/dL — ABNORMAL LOW (ref 30.0–36.0)
MCV: 88.3 fL (ref 80.0–100.0)
Platelets: 384 K/uL (ref 150–400)
RBC: 3.08 MIL/uL — ABNORMAL LOW (ref 3.87–5.11)
RDW: 20.6 % — ABNORMAL HIGH (ref 11.5–15.5)
WBC: 7.4 K/uL (ref 4.0–10.5)
nRBC: 0 % (ref 0.0–0.2)

## 2024-05-17 LAB — RENAL FUNCTION PANEL
Albumin: 2.3 g/dL — ABNORMAL LOW (ref 3.5–5.0)
Anion gap: 9 (ref 5–15)
BUN: 6 mg/dL — ABNORMAL LOW (ref 8–23)
CO2: 19 mmol/L — ABNORMAL LOW (ref 22–32)
Calcium: 8.1 mg/dL — ABNORMAL LOW (ref 8.9–10.3)
Chloride: 117 mmol/L — ABNORMAL HIGH (ref 98–111)
Creatinine, Ser: 1.25 mg/dL — ABNORMAL HIGH (ref 0.44–1.00)
GFR, Estimated: 46 mL/min — ABNORMAL LOW (ref >60)
Glucose, Bld: 76 mg/dL (ref 70–99)
Phosphorus: 3.2 mg/dL (ref 2.5–4.6)
Potassium: 3.9 mmol/L (ref 3.5–5.1)
Sodium: 144 mmol/L (ref 135–145)

## 2024-05-17 LAB — MAGNESIUM: Magnesium: 1.7 mg/dL (ref 1.7–2.4)

## 2024-05-17 LAB — GLUCOSE, CAPILLARY
Glucose-Capillary: 107 mg/dL — ABNORMAL HIGH (ref 70–99)
Glucose-Capillary: 91 mg/dL (ref 70–99)

## 2024-05-17 MED ORDER — ORAL CARE MOUTH RINSE: 15.0000 mL | OROMUCOSAL | Status: DC | PRN

## 2024-05-17 MED ORDER — LOPERAMIDE HCL 2 MG PO CAPS: 2.0000 mg | ORAL_CAPSULE | ORAL | Status: DC | PRN

## 2024-05-17 MED ORDER — SODIUM BICARBONATE 650 MG PO TABS: 1300.0000 mg | ORAL_TABLET | Freq: Two times a day (BID) | ORAL | 1 refills | Status: DC

## 2024-05-17 MED ORDER — POTASSIUM CHLORIDE 20 MEQ PO PACK: 40.0000 meq | PACK | Freq: Every day | ORAL | 1 refills | Status: DC

## 2024-05-17 MED ORDER — MAGNESIUM SULFATE 2 GM/50ML IV SOLN
2.0000 g | Freq: Once | INTRAVENOUS | Status: AC
  Administered 2024-05-17: 2 g via INTRAVENOUS
  Filled 2024-05-17: qty 50

## 2024-05-17 NOTE — Discharge Summary (Signed)
 Physician Discharge Summary  Brooke [name] FMW:[id number] DOB: [birth year] DOA: 05/12/2024  PCP: Norleen Lynwood ORN, MD  Admit date: 05/12/2024 Discharge date: 05/17/2024  Time spent: 60 minutes  Recommendations for Outpatient Follow-up:  Follow-up with Norleen Lynwood ORN, MD in 3 days.  On follow-up patient will need a BMET, magnesium  and phosphorus levels checked.  BP will need to be reassessed as patient's hydralazine  was held on discharge. Follow-up with Dr. Dennise, nephrology in 2 weeks.   Discharge Diagnoses:  Principal Problem:   Hypokalemia Active Problems:   DM2 (diabetes mellitus, type 2) (HCC)   GERD (gastroesophageal reflux disease)   CKD stage 3b, GFR 30-44 ml/min (HCC)   Generalized weakness   Hypomagnesemia   Renal tubular acidosis   History of essential hypertension   Moderate persistent asthma   Diarrhea   Hypophosphatemia   Metabolic acidosis   Discharge Condition: Stable and improved.  Diet recommendation: Regular  Filed Weights   05/15/24 0500 05/16/24 0500 05/17/24 0500  Weight: 75.2 kg 76.1 kg 80.8 kg    History of present illness:  HPI per Dr. Marcene Dorian [name] is a 73 y.o. female with medical history significant for type 2 diabetes mellitus, CKD 3B with baseline creatinine 1.5-1.9, GERD, send hypertension, moderate persistent asthma, who is admitted to [facility] on 05/12/2024 with hypokalemia after presenting from home to [facility] ED complaining of hypokalemia.    The patient was recently hospitalized in the [facility] system from 04/29/2024 to 05/05/2024.  She was attending her routine post-hospitalization out patient follow-up with her PCP earlier today, at which time PCP checked updated labs that reflected hypokalemia with potassium level reported to be 2.3, prompting PCP to recommend the patient that she present to the ED for further evaluation management thereof.   Denies any recent nausea, vomiting, diarrhea.  Reports some mild generalized  weakness, fatigue over the last 3 to 4 days, in the absence of any acute focal weakness.  In terms of hypokalemia, outpatient labs notable for omeprazole , ophthalmic torsemide, as well as potassium chloride  20 mill equivalents p.o. daily.  She has a history of CKD 3B with baseline creatinine 1.5-1.9.  Also on chronic beta-2 agonists in the form of Trelegy as well as as needed albuterol  inhaler.       ED Course:  Vital signs in the ED were notable for the following: Afebrile; heart rates in the 60s; as well as systolic blood pressures in the low 100s 130s; respiratory rate 15-90, oxygen  saturation 96 to 1% on room air.   Labs were notable for the following: CMP notable for sodium 143, potassium 3.7, bicarbonate 16, anion gap 15, creatinine 1.84 compared to 1.88 on 05/05/2024, glucose 98.  Magnesium  1.3.  CBC notable for blood cell count 10,000.   Per my interpretation, EKG in ED demonstrated the following: Sinus rhythm with heart rate 75, nonspecific T wave version aVL, no evidence of ST changes, including no evidence of ST elevation.   Imaging in the ED, per corresponding formal radiology read, was notable for the following: No imaging performed in the ED today.   While in the ED, the following were administered: Potassium chloride  40 mill colons p.o. x 1 dose, potassium chloride  10 mill colons IV over 1 hour x 1 dose, magnesium  sulfate 4 g IV over 4 hours.   Subsequently, the patient was admitted for further evaluation management of presenting hypokalemia, hypomagnesemia, and generalized weakness.     Hospital Course:  #1 hypokalemia/hypomagnesemia -Patient on  presentation noted to have a potassium of 2.7 and a magnesium  level of 1.3 on admission. -Patient noted to have multiple watery loose stools which improved during the hospitalization.  -Likely etiology of electrolyte abnormalities as patient has diarrhea. - Patient with complaints of generalized weakness and fatigue. - Patient noted to  be on omeprazole  in the outpatient setting, ophthalmic dorzolamide  as well as beta-2 agonist including Trelegy as well as as needed albuterol . - Patient noted to have received 40 mEq of oral potassium as well as 10 mEq of IV potassium in the ED and given an additional 40 mill equivalents of oral potassium x 2 on 05/13/2024. - Electrolytes were repleted during the hospitalization and patient will be discharged on KDur 40 mEq daily until follow-up with PCP for repeat lab work and nephrologist.  -Outpatient follow-up.   2.  Generalized weakness -Patient with complaints of 4 to 5 days of generalized weakness in the absence of any focal weakness. - Generalized weakness could be likely secondary to electrolyte abnormalities in the setting of GI losses of diarrhea. -Improved slowly during the hospitalization. - Urinalysis nitrite negative, leukocytes negative. - Chest x-ray done negative for any acute abnormalities. - TSH within normal limits at 2.0. - Electrolytes repleted.   - Patient seen by PT/OT who recommended home health therapies which has been arranged by [facility].   - Outpatient follow-up with PCP.    3.  CKD stage IIIb/metabolic acidosis -Patient noted to have a nonanion gap metabolic acidosis with evidence of hyperchloremia on admission. -Acidosis improved on bicarb tablets.   - Home regimen bicarb tablets were increased to 1300 mg twice daily with improvement in metabolic acidosis.   - Patient be discharged on increased dose of bicarb tablets 1300 mg twice daily until outpatient follow-up with primary nephrologist.  -Discussed with patient's primary nephrologist, Dr. Dennise. - Outpatient follow-up with nephrology.   4.  Well-controlled diabetes mellitus type 2 -Hemoglobin A1c 6.2 (02/26/2024). - Patient noted to be on Jardiance  prior to admission which was held during the hospitalization and will be resumed on discharge.  - Patient maintained on SSI during the hospitalization.    5.   GERD -Patient presented with hypomagnesemia and hypokalemia. - Patient noted to be on omeprazole  in the outpatient setting which has been discontinued and will be held on discharge until follow-up with PCP..   - Patient maintained on Pepcid .     6.  Hypertension - BP remained controlled on metoprolol  during the hospitalization.  -Patient diltiazem  and hydralazine  were held.   - Diltiazem  will be resumed on discharge.   - Outpatient follow-up with PCP.    7.  Moderate persistent asthma -Patient noted to be on Trelegy and albuterol  in the outpatient setting. - Patient maintained on Trelegy.   8.  Diarrhea -Patient with noted multiple watery stools prior to hospitalization and during the hospitalization.   -C. difficile PCR negative. -GI pathogen panel negative. - Patient placed on Imodium as needed with clinical improvement. - Outpatient follow-up.   9.  Hypophosphatemia - Repleted.   -Outpatient follow-up.  Procedures: Chest x-ray 05/13/2024  Consultations: Curb sided nephrology: Dr. Dennise   Discharge Exam: Vitals:   05/17/24 0851 05/17/24 1301  BP: (!) 152/78 137/69  Pulse: 84 80  Resp:  16  Temp:  98.6 F (37 C)  SpO2:  98%    General: NAD Cardiovascular: RRR no murmurs rubs or gallops.  No JVD.  No lower extremity edema. Respiratory: Clear to auscultation bilaterally.  No  wheezes, no crackles, no rhonchi.  Fair air movement.  Speaking in full sentences.  Discharge Instructions   Discharge Instructions     Diet general   Complete by: As directed    Increase activity slowly   Complete by: As directed       Allergies as of 05/17/2024       Reactions   Gnp Glp-1 Daily Support [germanium] Other (See Comments)   Ileus requiring hospitalization   Metformin  And Related Nausea And Vomiting   Ace Inhibitors Swelling, Other (See Comments)   Ankles swell   Codeine Nausea And Vomiting, Rash   Hydrocodone  Nausea And Vomiting   Tramadol  Nausea And Vomiting    Tylenol  [acetaminophen ] Itching, Rash, Other (See Comments)   Allergic to prescription strength Tylenol . The patient is taking 500 mg Tylenol  in 2025, however.        Medication List     PAUSE taking these medications    hydrALAZINE  25 MG tablet Wait to take this until your doctor or other care provider tells you to start again. Commonly known as: APRESOLINE  TAKE 1 TABLET BY MOUTH THREE TIMES A DAY   omeprazole  40 MG capsule Wait to take this until your doctor or other care provider tells you to start again. Commonly known as: PRILOSEC Take 1 capsule (40 mg total) by mouth daily. What changed:  when to take this additional instructions       TAKE these medications    albuterol  108 (90 Base) MCG/ACT inhaler Commonly known as: VENTOLIN  HFA Inhale 2 puffs into the lungs every 4 (four) hours as needed for wheezing or shortness of breath.   allopurinol  100 MG tablet Commonly known as: ZYLOPRIM  TAKE 1 TABLET BY MOUTH EVERY DAY   atropine 1 % ophthalmic solution Place 1 drop into the left eye daily.   Azelastine  HCl 137 MCG/SPRAY Soln PLACE 2 SPRAYS INTO BOTH NOSTRILS 2 (TWO) TIMES DAILY. USE IN EACH NOSTRIL AS DIRECTED What changed: See the new instructions.   brimonidine  0.2 % ophthalmic solution Commonly known as: ALPHAGAN  3 (three) times daily. 1 drop in the left eye three times daily What changed:  how much to take how to take this when to take this additional instructions   cetirizine  10 MG tablet Commonly known as: ZYRTEC  TAKE 1 TABLET BY MOUTH EVERY DAY   citalopram  40 MG tablet Commonly known as: CELEXA  TAKE 1 TABLET BY MOUTH EVERY DAY   clonazePAM  0.5 MG tablet Commonly known as: KLONOPIN  Take 1 tablet (0.5 mg total) by mouth daily as needed (vertigo).   colchicine  0.6 MG tablet TAKE 0.5 TABLETS (0.3 MG TOTAL) BY MOUTH DAILY AS NEEDED (GOUT OR PSUEDOGOUT PAIN).   diltiazem  240 MG 24 hr capsule Commonly known as: CARDIZEM  CD Take 1 capsule  (240 mg total) by mouth daily.   dorzolamide  2 % ophthalmic solution Commonly known as: TRUSOPT  Place 1 drop into the left eye 3 (three) times daily.   famotidine  20 MG tablet Commonly known as: Pepcid  Take 1 tablet (20 mg total) by mouth 2 (two) times daily.   Jardiance  25 MG Tabs tablet Generic drug: empagliflozin  TAKE 1 TABLET BY MOUTH EVERY DAY BEFORE BREAKFAST What changed: See the new instructions.   latanoprost  0.005 % ophthalmic solution Commonly known as: XALATAN  Place 1 drop into the left eye at bedtime.   levocetirizine 5 MG tablet Commonly known as: XYZAL  Take 1 tablet (5 mg total) by mouth daily.   loperamide 2 MG capsule Commonly known as:  IMODIUM Take 1 capsule (2 mg total) by mouth as needed for diarrhea or loose stools.   meclizine  25 MG tablet Commonly known as: ANTIVERT  TAKE 1 TABLET BY MOUTH 3 TIMES A DAY AS NEEDED FOR DIZZINESS What changed: See the new instructions.   metoprolol  succinate 100 MG 24 hr tablet Commonly known as: TOPROL -XL TAKE 1 TABLET BY MOUTH 2 (TWO) TIMES DAILY. TAKE WITH OR IMMEDIATELY FOLLOWING A MEAL. What changed: See the new instructions.   ondansetron  4 MG disintegrating tablet Commonly known as: ZOFRAN -ODT Take 1 tablet (4 mg total) by mouth every 8 (eight) hours as needed for nausea or vomiting. What changed: reasons to take this   potassium chloride  20 MEQ packet Commonly known as: KLOR-CON  Take 40 mEq by mouth daily. What changed: how much to take   rosuvastatin  40 MG tablet Commonly known as: CRESTOR  TAKE 1 TABLET BY MOUTH EVERY DAY   sodium bicarbonate  650 MG tablet Take 2 tablets (1,300 mg total) by mouth 2 (two) times daily. What changed: how much to take   traZODone  100 MG tablet Commonly known as: DESYREL  TAKE 1 TABLET (100 MG TOTAL) BY MOUTH AT BEDTIME AS NEEDED. FOR SLEEP What changed:  when to take this additional instructions   Trelegy Ellipta  200-62.5-25 MCG/ACT Aepb Generic drug:  Fluticasone -Umeclidin-Vilant Inhale 1 puff into the lungs daily.   TYLENOL  500 MG tablet Generic drug: acetaminophen  Take 1,000 mg by mouth every 8 (eight) hours as needed for mild pain (pain score 1-3) (or headaches).       Allergies  Allergen Reactions   Gnp Glp-1 Daily Support [Germanium] Other (See Comments)    Ileus requiring hospitalization   Metformin  And Related Nausea And Vomiting   Ace Inhibitors Swelling and Other (See Comments)    Ankles swell   Codeine Nausea And Vomiting and Rash   Hydrocodone  Nausea And Vomiting   Tramadol  Nausea And Vomiting   Tylenol  [Acetaminophen ] Itching, Rash and Other (See Comments)    Allergic to prescription strength Tylenol . The patient is taking 500 mg Tylenol  in 2025, however.    Contact information for follow-up providers     Norleen Lynwood ORN, MD. Schedule an appointment as soon as possible for a visit in 3 day(s).   Specialties: Internal Medicine, Radiology Why: Follow-up in 3 days.  Will need BMET, magnesium  level checked. Contact information: [address] [place] [address] [phone]         Dennise Hoes, MD. Schedule an appointment as soon as possible for a visit in 2 week(s).   Specialty: Nephrology Contact information: [address] [place] [address] [phone]              Contact information for after-discharge care     Home Medical Care     [facility]) .   Service: Home Health Services Why: Ar[place] will provide PT and OT after discharge. Contact information: [address] [place]  [id number] [phone]                      The results of significant diagnostics from this hospitalization (including imaging, microbiology, ancillary and laboratory) are listed below for reference.    Significant Diagnostic Studies: DG Chest Port 1 View Result Date: 05/13/2024 EXAM: 1 VIEW(S) XRAY OF THE CHEST 05/13/2024 07:17:00 AM COMPARISON:  02/18/2024 CLINICAL HISTORY: Generalized weakness 412262. Weakness FINDINGS: LUNGS AND PLEURA: Lower lung volumes. No focal pulmonary opacity. No pulmonary edema. No  pleural effusion. No pneumothorax. HEART AND MEDIASTINUM: No acute abnormality of the cardiac and mediastinal silhouettes. BONES AND SOFT TISSUES: No acute osseous abnormality. IMPRESSION: 1. No acute cardiopulmonary process. Electronically signed by: Waddell Calk MD 05/13/2024 07:21 AM EDT RP Workstation: [id number]   DG Abd Portable 1V-Small Bowel Obstruction Protocol-initial, 8 hr delay Result Date: 05/02/2024 EXAM: 1 VIEW XRAY OF THE ABDOMEN 05/02/2024 05:30:00 AM COMPARISON: 05/01/2024 CLINICAL HISTORY: 8 hour SBO protocol image FINDINGS: LINES, TUBES AND DEVICES: Enteric tube stable in place. BOWEL: Mildly regressed small bowel distension. Enteric contrast material is identified within the distal small bowel, colon, and rectum. SOFT TISSUES: Stable cholecystectomy clips. Left renal artery region vascular stent again noted. No opaque urinary calculi. BONES: No acute osseous abnormality. IMPRESSION: 1. Mildly regressed small bowel distension. 2. Enteric contrast present within the distal small bowel, colon, and rectum. Electronically signed by: Waddell Calk MD 05/02/2024 06:12 AM EDT RP Workstation: [id number]   VAS US  RENAL ARTERY DUPLEX Result Date: 05/01/2024 ABDOMINAL VISCERAL Patient Name:  KAYLINA CAHUE [facility]  Date of Exam:   05/01/2024 Medical Rec #: [id number]       Accession #:    [id number] Date of Birth: Jun 01, 1951       Patient Gender: F Patient Age:   73 years Exam Location:  [facility] Procedure:      VAS US  RENAL ARTERY DUPLEX Referring Phys: [id number] JENNIFER YATES -------------------------------------------------------------------------------- Indications: Acute kidney injury [302455] High Risk Factors: Hypertension, hyperlipidemia, Diabetes. Limitations: Air/bowel gas, obesity, patient discomfort and patient movement,  patient respiratory disturbance. Comparison Study: No prior studies. Performing Technologist: Cordella Collet RVT  Examination Guidelines: A complete evaluation includes B-mode imaging, spectral Doppler, color Doppler, and power Doppler as needed of all accessible portions of each vessel. Bilateral testing is considered an integral part of a complete examination. Limited examinations for reoccurring indications may be performed as noted.  Duplex Findings: +--------------------+--------+--------+------+--------------+ Mesenteric          PSV cm/sEDV cm/sPlaque   Comments    +--------------------+--------+--------+------+--------------+ Aorta Mid             101      19                        +--------------------+--------+--------+------+--------------+ Celiac Artery Origin                      Not visualized +--------------------+--------+--------+------+--------------+ SMA Origin                                Not visualized +--------------------+--------+--------+------+--------------+    +------------------+--------+--------+-------+ Right Renal ArteryPSV cm/sEDV cm/sComment +------------------+--------+--------+-------+ Origin               52      22           +------------------+--------+--------+-------+ Proximal             85      32           +------------------+--------+--------+-------+ Mid                  82      25           +------------------+--------+--------+-------+ Distal               93      23           +------------------+--------+--------+-------+ +-----------------+--------+--------+-------+ Left  Renal ArteryPSV cm/sEDV cm/sComment +-----------------+--------+--------+-------+ Origin              52      14           +-----------------+--------+--------+-------+ Proximal            59      18           +-----------------+--------+--------+-------+ Mid                103      26            +-----------------+--------+--------+-------+ Distal              64      17           +-----------------+--------+--------+-------+ +------------+--------+--------+----+-----------+--------+--------+----+ Right KidneyPSV cm/sEDV cm/sRI  Left KidneyPSV cm/sEDV cm/sRI   +------------+--------+--------+----+-----------+--------+--------+----+ Upper Pole  41      13      0.68Upper Pole 52      10      0.80 +------------+--------+--------+----+-----------+--------+--------+----+ Mid         33      9       0.        51      13      0.74 +------------+--------+--------+----+-----------+--------+--------+----+ Lower Pole  42      12      0.70Lower Pole 53      14      0.73 +------------+--------+--------+----+-----------+--------+--------+----+ Hilar       93      19      0.79Hilar      70      19      0.73 +------------+--------+--------+----+-----------+--------+--------+----+ +------------------+----+------------------+----+ Right Kidney          Left Kidney            +------------------+----+------------------+----+ RAR                   RAR                    +------------------+----+------------------+----+ RAR (manual)      0.92RAR (manual)      1.02 +------------------+----+------------------+----+ Cortex                Cortex                 +------------------+----+------------------+----+ Cortex thickness      Corex thickness        +------------------+----+------------------+----+ Kidney length (cm)9.50Kidney length (cm)9.00 +------------------+----+------------------+----+  Summary: Renal:  Right: Normal size right kidney. Normal right Resisitive Index. No        evidence of right renal artery stenosis. Left:  Normal size of left kidney. Normal left Resistive Index. No        evidence of left renal artery stenosis.  *See table(s) above for measurements and observations.  Diagnosing physician: Gaile New MD  Electronically  signed by Gaile New MD on 05/01/2024 at 9:57:11 PM.    Final    DG Abd 1 View Result Date: 05/01/2024 CLINICAL DATA:  NG tube advanced. EXAM: ABDOMEN - 1 VIEW COMPARISON:  May 01, 2024 (4:27 p.m.) FINDINGS: A nasogastric tube is seen with its distal tip overlying the expected region of the proximal duodenum. Multiple dilated loops of air-filled small bowel are again seen throughout the abdomen. Radiopaque surgical clips are seen within the right upper quadrant. No radio-opaque calculi or other significant radiographic abnormality are seen. IMPRESSION: 1. Nasogastric tube positioning, as described  above. 2. Findings consistent with a persistent small bowel obstruction. Electronically Signed   By: Suzen Dials M.D.   On: 05/01/2024 18:04   DG Abd Portable 1V-Small Bowel Protocol-Position Verification Result Date: 05/01/2024 EXAM: 1 VIEW XRAY OF THE ABDOMEN 05/01/2024 04:31:00 PM COMPARISON: 05/01/2024 CLINICAL HISTORY: Small bowel obstruction (SBO). FINDINGS: LINES, TUBES AND DEVICES: Nasogastric tube extends to the distal esophagus. BOWEL: Stomach appears decompressed. Several gas dilated mid abdominal small bowel loops, slightly decreased in number and degree of dilatation. The colon appears decompressed. SOFT TISSUES: Cholecystectomy clips. No opaque urinary calculi. BONES: No acute osseous abnormality. IMPRESSION: 1. Small bowel obstruction, slightly improved compared to 05/01/2024. 2. Gastric tube  extends only to distal esophagus. Electronically signed by: Katheleen Faes MD 05/01/2024 04:38 PM EDT RP Workstation: [id number]   DG Abd 1 View Result Date: 05/01/2024 EXAM: 1 VIEW XRAY OF THE ABDOMEN 05/01/2024 05:42:00 AM COMPARISON: 04/30/2024 CLINICAL HISTORY: Ileus (HCC) 01250. ileus. FINDINGS: LINES, TUBES AND DEVICES: Enteric tube terminates in the stomach. Left renal artery stent noted. BOWEL: Persistent and progressive dilated small bowel loops in the left mid and right lower quadrant  abdomen. SOFT TISSUES: Surgical clips in the right upper quadrant of the abdomen. No opaque urinary calculi. BONES: No acute osseous abnormality. IMPRESSION: 1. Persistent and progressive small bowel dilatation compatible with either mid to distal small bowel obstruction versus ileus. 2. Enteric tube terminates in the stomach. 3. Postsurgical changes with right upper quadrant surgical clips and left renal artery stent. Electronically signed by: Waddell Calk MD 05/01/2024 08:34 AM EDT RP Workstation: [id number]   DG Abd 1 View Result Date: 04/30/2024 EXAM: 1 VIEW XRAY OF THE ABDOMEN 04/30/2024 05:20:00 AM COMPARISON: 04/29/2024 CLINICAL HISTORY: Ileus (HCC) 01250. ileus FINDINGS: LINES, TUBES AND DEVICES: Enteric tube is in place with tip in side port below the GE junction. The tube has been slightly retracted with the side hole approximately 2 cm below the GE junction. Left renal artery stent noted. BOWEL: Single dilated loop of small bowel within the left upper quadrant, up to 3.8 cm in diameter. SOFT TISSUES: Right upper quadrant surgical clips noted. No opaque urinary calculi. BONES: No acute osseous abnormality. IMPRESSION: 1. Single dilated loop of small bowel in the left abdomen measuring up to 3.8 cm, suggestive of focal ileus. 2. Slight retraction of the enteric tube. The side hole for the tube is 2 cm below the ge junction. Electronically signed by: Waddell Calk MD 04/30/2024 07:46 AM EDT RP Workstation: [id number]   DG Abd 1 View Result Date: 04/29/2024 CLINICAL DATA:  NG tube placement. EXAM: ABDOMEN - 1 VIEW COMPARISON:  Same day abdominal radiograph dated 04/29/2024 at 9:44 a.m. FINDINGS: Enteric tube tip within the stomach. Visualized lung bases are clear. IMPRESSION: Enteric tube tip within the stomach. Electronically Signed   By: Harrietta Sherry M.D.   On: 04/29/2024 17:04   ECHOCARDIOGRAM COMPLETE Result Date: 04/29/2024    ECHOCARDIOGRAM REPORT   Patient Name:   KEALY [facility] Date of  Exam: 04/29/2024 Medical Rec #:  [id number]      Height:       63.0 in Accession #:    [id number]     Weight:       178.0 lb Date of Birth:  1950-10-12      BSA:          1.840 m Patient Age:    73 years       BP:  177/81 mmHg Patient Gender: F              HR:           119 bpm. Exam Location:  Inpatient Procedure: 2D Echo, Cardiac Doppler and Color Doppler (Both Spectral and Color            Flow Doppler were utilized during procedure). Indications:    Chest Pain R07.9  History:        Patient has prior history of Echocardiogram examinations, most                 recent 11/10/2021. COPD, Signs/Symptoms:Chest Pain; Risk                 Factors:Diabetes and Hypertension. H/O Chronic kidney disease.  Sonographer:    Bernarda Rocks Referring Phys: [phone] STEVEN J NEWTON IMPRESSIONS  1. Left ventricular ejection fraction, by estimation, is 70 to 75%. The left ventricle has hyperdynamic function. The left ventricle has no regional wall motion abnormalities. There is mild left ventricular hypertrophy. Left ventricular diastolic parameters are consistent with Grade I diastolic dysfunction (impaired relaxation).  2. Findings concerning for LVOT obstruction. Details below.  3. Right ventricular systolic function is normal. The right ventricular size is normal. Mildly increased right ventricular wall thickness. There is moderately elevated pulmonary artery systolic pressure.  4. The mitral valve is normal in structure. No evidence of mitral valve regurgitation. No evidence of mitral stenosis.  5. The aortic valve is normal in structure. Aortic valve regurgitation is not visualized. No aortic stenosis is present.  6. The inferior vena cava is normal in size with greater than 50% respiratory variability, suggesting right atrial pressure of 3 mmHg. Comparison(s): A prior study was performed on 11/10/2021. LV outflow tract gradient now elevated. FINDINGS  Left Ventricle: There is a mid cavitary gradient of 41 mmHg at rest and  was 39 mmHg with valsalva but in a different location and may be underestimated. This is may be concerning for LVOT obstruction. Left ventricular ejection fraction, by estimation, is 70 to 75%. The left ventricle has hyperdynamic function. The left ventricle has no regional wall motion abnormalities. The left ventricular internal cavity size was small. There is mild left ventricular hypertrophy. Left ventricular diastolic parameters are consistent with Grade I diastolic dysfunction (impaired relaxation). Right Ventricle: The right ventricular size is normal. Mildly increased right ventricular wall thickness. Right ventricular systolic function is normal. There is moderately elevated pulmonary artery systolic pressure. The tricuspid regurgitant velocity is 3.39 m/s, and with an assumed right atrial pressure of 3 mmHg, the estimated right ventricular systolic pressure is 49.0 mmHg. Left Atrium: Left atrial size was normal in size. Right Atrium: Right atrial size was normal in size. Pericardium: Trivial pericardial effusion is present. The pericardial effusion is anterior to the right ventricle. Mitral Valve: The mitral valve is normal in structure. No evidence of mitral valve regurgitation. No evidence of mitral valve stenosis. MV peak gradient, 9.1 mmHg. The mean mitral valve gradient is 4.0 mmHg. Tricuspid Valve: The tricuspid valve is normal in structure. Tricuspid valve regurgitation is trivial. No evidence of tricuspid stenosis. Aortic Valve: The aortic valve is normal in structure. Aortic valve regurgitation is not visualized. No aortic stenosis is present. Aortic valve mean gradient measures 13.0 mmHg. Aortic valve peak gradient measures 27.2 mmHg. Aortic valve area, by VTI measures 1.60 cm. Pulmonic Valve: The pulmonic valve was normal in structure. Pulmonic valve regurgitation is not visualized. No evidence of pulmonic stenosis.  Aorta: The aortic root is normal in size and structure. Venous: The inferior  vena cava is normal in size with greater than 50% respiratory variability, suggesting right atrial pressure of 3 mmHg. IAS/Shunts: No atrial level shunt detected by color flow Doppler.  LEFT VENTRICLE PLAX 2D LVIDd:         2.70 cm     Diastology LVIDs:         1.70 cm     LV e' medial:    6.68 cm/s LV PW:         1.20 cm     LV E/e' medial:  15.4 LV IVS:        1.20 cm     LV e' lateral:   8.55 cm/s LVOT diam:     1.50 cm     LV E/e' lateral: 12.0 LV SV:         59 LV SV Index:   32 LVOT Area:     1.77 cm  LV Volumes (MOD) LV vol d, MOD A2C: 80.9 ml LV vol d, MOD A4C: 79.1 ml LV vol s, MOD A2C: 22.3 ml LV vol s, MOD A4C: 32.1 ml LV SV MOD A2C:     58.6 ml LV SV MOD A4C:     79.1 ml LV SV MOD BP:      55.5 ml RIGHT VENTRICLE             IVC RV Basal diam:  2.80 cm     IVC diam: 1.40 cm RV S prime:     27.10 cm/s TAPSE (M-mode): 2.1 cm RVSP:           49.0 mmHg LEFT ATRIUM             Index        RIGHT ATRIUM           Index LA diam:        3.20 cm 1.74 cm/m   RA Pressure: 3.00 mmHg LA Vol (A2C):   31.9 ml 17.34 ml/m  RA Area:     8.39 cm LA Vol (A4C):   49.4 ml 26.84 ml/m  RA Volume:   14.80 ml  8.04 ml/m LA Biplane Vol: 41.2 ml 22.39 ml/m  AORTIC VALVE                     PULMONIC VALVE AV Area (Vmax):    1.58 cm      PV Vmax:          1.85 m/s AV Area (Vmean):   1.51 cm      PV Peak grad:     13.7 mmHg AV Area (VTI):     1.60 cm      PR End Diast Vel: 3.25 msec AV Vmax:           261.00 cm/s AV Vmean:          168.000 cm/s AV VTI:            0.370 m AV Peak Grad:      27.2 mmHg AV Mean Grad:      13.0 mmHg LVOT Vmax:         234.00 cm/s LVOT Vmean:        144.000 cm/s LVOT VTI:          0.335 m LVOT/AV VTI ratio: 0.91  AORTA Ao Root diam: 2.40 cm Ao Asc diam:  2.80 cm MITRAL VALVE  TRICUSPID VALVE MV Area (PHT): 8.25 cm     TR Peak grad:   46.0 mmHg MV Area VTI:   2.84 cm     TR Vmax:        339.00 cm/s MV Peak grad:  9.1 mmHg     Estimated RAP:  3.00 mmHg MV Mean grad:  4.0 mmHg      RVSP:           49.0 mmHg MV Vmax:       1.51 m/s MV Vmean:      95.8 cm/s    SHUNTS MV Decel Time: 92 msec      Systemic VTI:  0.34 m MV E velocity: 103.00 cm/s  Systemic Diam: 1.50 cm MV A velocity: 158.00 cm/s MV E/A ratio:  0.65 Emeline Calender Electronically signed by Emeline Calender Signature Date/Time: 04/29/2024/3:45:42 PM    Final    DG Abd 1 View Result Date: 04/29/2024 CLINICAL DATA:  Ileus. EXAM: ABDOMEN - 1 VIEW COMPARISON:  CT abdomen and pelvis 04/28/2024 FINDINGS: Bowel gas in the small and large bowel. Small bowel loops measuring up to 3.4 cm. Gas in the rectum. Cholecystectomy clips. Left renal artery stent. No significant calcifications overlying the kidneys or expected course of the ureters. IMPRESSION: 1. Gas within small and large bowel. Findings are nonspecific but could be associated with an ileus. No significant change since 04/28/2024. Electronically Signed   By: Juliene Balder M.D.   On: 04/29/2024 10:22   CT ABDOMEN PELVIS WO CONTRAST Result Date: 04/28/2024 CLINICAL DATA:  Right upper quadrant pain EXAM: CT ABDOMEN AND PELVIS WITHOUT CONTRAST TECHNIQUE: Multidetector CT imaging of the abdomen and pelvis was performed following the standard protocol without IV contrast. RADIATION DOSE REDUCTION: This exam was performed according to the departmental dose-optimization program which includes automated exposure control, adjustment of the mA and/or kV according to patient size and/or use of iterative reconstruction technique. COMPARISON:  04/14/2024 FINDINGS: Lower chest: No acute abnormality. Hepatobiliary: Gallbladder has been surgically removed. Stable cyst is noted within the right lobe of the liver. Pancreas: Unremarkable. No pancreatic ductal dilatation or surrounding inflammatory changes. Spleen: Normal in size without focal abnormality. Adrenals/Urinary Tract: Adrenal glands are within normal limits. Kidneys demonstrate no renal calculi or obstructive changes. Bladder is decompressed.  Stomach/Bowel: Scattered diverticular change of the colon is noted. No evidence of diverticulitis is seen. The appendix is within normal limits. Generalized small bowel dilatation is noted which extends to the level of the terminal ileum without obstructing mass. Stomach is decompressed. Vascular/Lymphatic: Aortic atherosclerosis. No enlarged abdominal or pelvic lymph nodes. Reproductive: Status post hysterectomy. No adnexal masses. Other: No abdominal wall hernia or abnormality. No abdominopelvic ascites. Musculoskeletal: No acute or significant osseous findings. IMPRESSION: Generalized small bowel dilatation extending to the terminal ileum. No obstructing mass lesion is seen in these changes may represent a generalized small bowel ileus. Correlate with physical exam. Diverticulosis without diverticulitis. Electronically Signed   By: Oneil Devonshire M.D.   On: 04/28/2024 03:29    Microbiology: Recent Results (from the past 240 hours)  C Difficile Quick Screen w PCR reflex     Status: None   Collection Time: 05/14/24  9:32 PM   Specimen: STOOL  Result Value Ref Range Status   C Diff antigen NEGATIVE NEGATIVE Final   C Diff toxin NEGATIVE NEGATIVE Final   C Diff interpretation No C. difficile detected.  Final    Comment: Performed at [facility], [address]  [place], [place] [address]  Gastrointestinal Panel by PCR , Stool     Status: None   Collection Time: 05/14/24  9:32 PM   Specimen: STOOL  Result Value Ref Range Status   Campylobacter species NOT DETECTED NOT DETECTED Final   Plesimonas shigelloides NOT DETECTED NOT DETECTED Final   Salmonella species NOT DETECTED NOT DETECTED Final   Yersinia enterocolitica NOT DETECTED NOT DETECTED Final   Vibrio species NOT DETECTED NOT DETECTED Final   Vibrio cholerae NOT DETECTED NOT DETECTED Final   Enteroaggregative E coli (EAEC) NOT DETECTED NOT DETECTED Final   Enteropathogenic E coli (EPEC) NOT DETECTED NOT DETECTED Final    Enterotoxigenic E coli (ETEC) NOT DETECTED NOT DETECTED Final   Shiga like toxin producing E coli (STEC) NOT DETECTED NOT DETECTED Final   Shigella/Enteroinvasive E coli (EIEC) NOT DETECTED NOT DETECTED Final   Cryptosporidium NOT DETECTED NOT DETECTED Final   Cyclospora cayetanensis NOT DETECTED NOT DETECTED Final   Entamoeba histolytica NOT DETECTED NOT DETECTED Final   Giardia lamblia NOT DETECTED NOT DETECTED Final   Adenovirus F40/41 NOT DETECTED NOT DETECTED Final   Astrovirus NOT DETECTED NOT DETECTED Final   Norovirus GI/GII NOT DETECTED NOT DETECTED Final   Rotavirus A NOT DETECTED NOT DETECTED Final   Sapovirus (I, II, IV, and V) NOT DETECTED NOT DETECTED Final    Comment: Performed at [facility], [address] [place], [place] [address]     Labs: Basic Metabolic Panel: Recent Labs  Lab 05/12/24 1808 05/13/24 0500 05/13/24 2103 05/14/24 0403 05/14/24 1645 05/15/24 0350 05/16/24 0416 05/17/24 0412  NA  --  144   < > 141 139 142 143 144  K  --  2.9*   < > 2.8* 3.8 3.6 3.1* 3.9  CL  --  115*   < > 117* 114* 117* 116* 117*  CO2  --  16*   < > 15* 16* 18* 18* 19*  GLUCOSE  --  90   < > 87 85 82 80 76  BUN  --  11   < > 10 9 8  6* 6*  CREATININE  --  1.71*   < > 1.41* 1.44* 1.28* 1.31* 1.25*  CALCIUM   --  7.7*   < > 8.0* 8.3* 8.2* 8.1* 8.1*  MG 1.3* 2.8*  --  2.3  --  2.0  --  1.7  PHOS  --   --   --  2.9  --  2.0* 3.1 3.2   < > = values in this interval not displayed.   Liver Function Tests: Recent Labs  Lab 05/12/24 9061 05/13/24 0500 05/14/24 0403 05/15/24 0350 05/16/24 0416 05/17/24 0412  AST 16 17  --   --   --   --   ALT 13 9  --   --   --   --   ALKPHOS 81 87  --   --   --   --   BILITOT 0.3 0.2  --   --   --   --   PROT 6.0 5.3*  --   --   --   --   ALBUMIN 2.9* 2.6* 2.2* 2.2* 2.2* 2.3*   No results for input(s): LIPASE, AMYLASE in the last 168 hours. No results for input(s): AMMONIA in the last 168 hours. CBC: Recent Labs  Lab  05/12/24 0938 05/12/24 1746 05/13/24 0500 05/14/24 0403 05/14/24 1645 05/15/24 0350 05/17/24 0412  WBC 9.7   < > 7.9 7.5  6.5 7.1 7.4  NEUTROABS 7.4  --  5.8  --   --  4.7  --   HGB 10.0*   < > 9.1* 7.9* 8.6* 8.2* 8.0*  HCT 30.7*   < > 28.5* 26.3* 28.0* 25.9* 27.2*  MCV 82.3   < > 85.1 85.7 86.2 84.9 88.3  PLT 443.0*   < > 434* 424* 387 421* 384   < > = values in this interval not displayed.   Cardiac Enzymes: No results for input(s): CKTOTAL, CKMB, CKMBINDEX, TROPONINI in the last 168 hours. BNP: BNP (last 3 results) Recent Labs    02/18/24 1811  BNP 54.3    ProBNP (last 3 results) No results for input(s): PROBNP in the last 8760 hours.  CBG: Recent Labs  Lab 05/16/24 1134 05/16/24 1701 05/16/24 2015 05/17/24 0730 05/17/24 1127  GLUCAP 103* 104* 87 107* 91       Signed:  Toribio Hummer MD.  [facility] 05/17/2024, 3:23 PM

## 2024-05-17 NOTE — TOC Transition Note (Signed)
 Transition of Care [facility]) - Discharge Note   Patient Details  Name: Brooke [name] MRN: [id number] Date of Birth: 1951-02-28  [facility] CM/SW Contact:  Jon ONEIDA Anon, RN Phone Number: 05/17/2024, 2:03 PM   Clinical Narrative:    Pt to discharge home with home health services with [facility]. Pt will receive HH PT/OT services at DC. Pt agreeable to the DC plan. HH orders are in place. There are no further ICM needs at this time.   Final next level of care: Home w Home Health Services Barriers to Discharge: Barriers Resolved   Patient Goals and CMS Choice Patient states their goals for this hospitalization and ongoing recovery are:: To return home with [facility] services CMS Medicare.gov Compare Post Acute Care list provided to:: Patient Choice offered to / list presented to : Patient      Discharge Placement                       Discharge Plan and Services Additional resources added to the After Visit Summary for   In-house Referral: Clinical Social Work   Post Acute Care Choice: Home Health          DME Arranged: N/A DME Agency: NA       HH Arranged: PT, OT HH Agency: [facility] Date HH Agency Contacted: 05/14/24   Representative spoke with at [facility] Agency: Arna  Social Drivers of Health (SDOH) Interventions SDOH Screenings   Food Insecurity: No Food Insecurity (05/13/2024)  Housing: Low Risk  (05/13/2024)  Transportation Needs: No Transportation Needs (05/13/2024)  Utilities: Not At Risk (05/13/2024)  Alcohol Screen: Low Risk  (09/24/2023)  Depression (PHQ2-9): Low Risk  (04/22/2024)  Financial Resource Strain: Low Risk  (09/24/2023)  Physical Activity: Insufficiently Active (09/24/2023)  Social Connections: Moderately Isolated (05/13/2024)  Stress: No Stress Concern Present (09/24/2023)  Tobacco Use: Low Risk  (05/13/2024)  Health Literacy: Adequate Health Literacy (09/24/2023)     Readmission Risk Interventions    05/14/2024   12:41  PM 05/05/2024   11:28 AM 04/16/2024    9:45 AM  Readmission Risk Prevention Plan  Transportation Screening Complete Complete Complete  PCP or Specialist Appt within 3-5 Days   Complete  HRI or Home Care Consult  Complete Complete  Social Work Consult for Recovery Care Planning/Counseling  Complete Complete  Palliative Care Screening  Not Applicable Not Applicable  Medication Review Oceanographer) Complete Complete Complete  HRI or Home Care Consult Complete    SW Recovery Care/Counseling Consult Complete    Palliative Care Screening Not Applicable    Skilled Nursing Facility Not Applicable

## 2024-05-18 ENCOUNTER — Telehealth: Payer: Self-pay

## 2024-05-18 NOTE — Transitions of Care (Post Inpatient/ED Visit) (Signed)
 05/19/2024  Name: Brooke [name] MRN: [id number] DOB: [birth year]  Today's TOC FU Call Status: Today's TOC FU Call Status:: Successful TOC FU Call Completed TOC FU Call Complete Date: 05/18/24 Patient's Name and Date of Birth confirmed.  Transition Care Management Follow-up Telephone Call Date of Discharge: 05/17/24 Discharge Facility: [facility]) Type of Discharge: Inpatient Admission Primary Inpatient Discharge Diagnosis:: hypokalemia How have you been since you were released from the hospital?: Same Any questions or concerns?: No  Items Reviewed: Did you receive and understand the discharge instructions provided?: Yes Medications obtained,verified, and reconciled?: Yes (Medications Reviewed) Any new allergies since your discharge?: No Dietary orders reviewed?: Yes Type of Diet Ordered:: regular diet Do you have support at home?: Yes People in Home [RPT]: child(ren), adult Name of Support/Comfort Primary Source: Varetta Chavers son  Medications Reviewed Today: Medications Reviewed Today     Reviewed by Xzayvion Vaeth E, RN (Registered Nurse) on 05/18/24 at 1605  Med List Status: <None>   Medication Order Taking? Sig Documenting Provider Last Dose Status Informant  albuterol  (VENTOLIN  HFA) 108 (90 Base) MCG/ACT inhaler [id number] Yes Inhale 2 puffs into the lungs every 4 (four) hours as needed for wheezing or shortness of breath. Jeneal Danita Macintosh, MD  Active Self, Pharmacy Records  allopurinol  (ZYLOPRIM ) 100 MG tablet [id number] Yes TAKE 1 TABLET BY MOUTH EVERY DAY Norleen Lynwood ORN, MD  Active Self, Pharmacy Records  atropine 1 % ophthalmic solution [id number] Yes Place 1 drop into the left eye daily. [provider]  Active Self, Pharmacy Records  Azelastine  HCl 137 MCG/SPRAY SOLN [id number] Yes PLACE 2 SPRAYS INTO BOTH NOSTRILS 2 (TWO) TIMES DAILY. USE IN EACH NOSTRIL AS DIRECTED  Patient taking differently: Place 2 sprays into the nose in the morning and at  bedtime.   Norleen Lynwood ORN, MD  Active Self, Pharmacy Records  brimonidine  (ALPHAGAN ) 0.2 % ophthalmic solution [id number] Yes 3 (three) times daily. 1 drop in the left eye three times daily  Patient taking differently: Place 1 drop into the left eye in the morning and at bedtime.   [provider]  Active Self, Pharmacy Records  cetirizine  (ZYRTEC ) 10 MG tablet [id number] Yes TAKE 1 TABLET BY MOUTH EVERY DAY Padgett, Danita Macintosh, MD  Active Self, Pharmacy Records  citalopram  (CELEXA ) 40 MG tablet [id number] Yes TAKE 1 TABLET BY MOUTH EVERY DAY Norleen Lynwood ORN, MD  Active Self, Pharmacy Records  clonazePAM  (KLONOPIN ) 0.5 MG tablet [id number] Yes Take 1 tablet (0.5 mg total) by mouth daily as needed (vertigo). Norleen Lynwood ORN, MD  Active Self, Pharmacy Records  colchicine  0.6 MG tablet [id number] Yes TAKE 0.5 TABLETS (0.3 MG TOTAL) BY MOUTH DAILY AS NEEDED (GOUT OR PSUEDOGOUT PAIN). Corey, Evan S, MD  Active Self, Pharmacy Records  diltiazem  (CARDIZEM  CD) 240 MG 24 hr capsule [id number] Yes Take 1 capsule (240 mg total) by mouth daily. Norleen Lynwood ORN, MD  Active Self, Pharmacy Records  dorzolamide  (TRUSOPT ) 2 % ophthalmic solution [id number] Yes Place 1 drop into the left eye 3 (three) times daily. [provider]  Active Self, Pharmacy Records  famotidine  (PEPCID ) 20 MG tablet [id number] Yes Take 1 tablet (20 mg total) by mouth 2 (two) times daily. Jeneal Danita Macintosh, MD  Active Self, Pharmacy Records           Med Note [place], DUROJAHYE' R   Tue May 12, 2024 10:24 PM)    Fluticasone -Umeclidin-Vilant (TRELEGY ELLIPTA ) 200-62.5-25 MCG/ACT AEPB [id number] Yes Inhale 1 puff into  the lungs daily. Jeneal Danita Macintosh, MD  Active Self, Pharmacy Records  hydrALAZINE  (APRESOLINE ) 25 MG tablet [id number]  TAKE 1 TABLET BY MOUTH THREE TIMES A DAY Norleen Lynwood ORN, MD  Active Self, Pharmacy Records  JARDIANCE  25 MG TABS tablet [id number] Yes TAKE 1 TABLET BY MOUTH EVERY DAY BEFORE BREAKFAST   Patient taking differently: Take 25 mg by mouth daily before breakfast.   Norleen Lynwood ORN, MD  Active Self, Pharmacy Records  latanoprost  (XALATAN ) 0.005 % ophthalmic solution [id number] Yes Place 1 drop into the left eye at bedtime. [provider]  Active Self, Pharmacy Records  levocetirizine (XYZAL ) 5 MG tablet [id number] Yes Take 1 tablet (5 mg total) by mouth daily. Jeneal Danita Macintosh, MD  Active Self, Pharmacy Records           Med Note [facility], DUROJAHYE' R   Tue May 12, 2024 10:25 PM)    loperamide (IMODIUM) 2 MG capsule [id number] Yes Take 1 capsule (2 mg total) by mouth as needed for diarrhea or loose stools. Sebastian Toribio GAILS, MD  Active   meclizine  (ANTIVERT ) 25 MG tablet [id number] Yes TAKE 1 TABLET BY MOUTH 3 TIMES A DAY AS NEEDED FOR DIZZINESS  Patient taking differently: Take 25 mg by mouth 3 (three) times daily as needed for dizziness.   Norleen Lynwood ORN, MD  Active Self, Pharmacy Records  metoprolol  succinate (TOPROL -XL) 100 MG 24 hr tablet [id number] Yes TAKE 1 TABLET BY MOUTH 2 (TWO) TIMES DAILY. TAKE WITH OR IMMEDIATELY FOLLOWING A MEAL.  Patient taking differently: Take 100 mg by mouth in the morning and at bedtime.   Shlomo Wilbert SAUNDERS, MD  Active Self, Pharmacy Records  omeprazole  [facility]) 40 MG capsule [id number]  Take 1 capsule (40 mg total) by mouth daily.  Patient taking differently: Take 40 mg by mouth See admin instructions. Take 40 mg by mouth 30 minutes prior to breakfast and supper   Norleen Lynwood ORN, MD  Active Self, Pharmacy Records           Med Note [place], DUROJAHYE' R   Tue May 12, 2024 10:26 PM)    ondansetron  (ZOFRAN -ODT) 4 MG disintegrating tablet [id number] Yes Take 1 tablet (4 mg total) by mouth every 8 (eight) hours as needed for nausea or vomiting.  Patient taking differently: Take 4 mg by mouth every 8 (eight) hours as needed for nausea or vomiting (dissolve orally).   Davia Nydia POUR, MD  Active Self, Pharmacy Records  potassium chloride   (KLOR-CON ) 20 MEQ packet [id number] Yes Take 40 mEq by mouth daily. Sebastian Toribio GAILS, MD  Active   rosuvastatin  (CRESTOR ) 40 MG tablet [id number] Yes TAKE 1 TABLET BY MOUTH EVERY DAY Shlomo Wilbert SAUNDERS, MD  Active Self, Pharmacy Records  sodium bicarbonate  650 MG tablet [id number] Yes Take 2 tablets (1,300 mg total) by mouth 2 (two) times daily. Sebastian Toribio GAILS, MD  Active   traZODone  (DESYREL ) 100 MG tablet [id number] Yes TAKE 1 TABLET (100 MG TOTAL) BY MOUTH AT BEDTIME AS NEEDED. FOR SLEEP  Patient taking differently: Take 100 mg by mouth at bedtime.   Norleen Lynwood ORN, MD  Active Self, Pharmacy Records  TYLENOL  500 MG tablet [id number] Yes Take 1,000 mg by mouth every 8 (eight) hours as needed for mild pain (pain score 1-3) (or headaches). [provider]  Active Self, Pharmacy Records            Home Care and Equipment/Supplies: Were Home Health Services Ordered?: Yes Name of Home  Health Agency:: [facility] Home health Has Agency set up a time to come to your home?: No EMR reviewed for Home Health Orders: Orders present/patient has not received call (refer to CM for follow-up) (patient has contact phone number to call) Any new equipment or medical supplies ordered?: No  Functional Questionnaire: Do you need assistance with bathing/showering or dressing?: No Do you need assistance with meal preparation?: No Do you need assistance with eating?: No Do you have difficulty maintaining continence: No Do you need assistance with getting out of bed/getting out of a chair/moving?: No Do you have difficulty managing or taking your medications?: No  Follow up appointments reviewed: PCP Follow-up appointment confirmed?: No (patient states she will call and schedul) Specialist Hospital Follow-up appointment confirmed?: Yes Date of Specialist follow-up appointment?: 05/22/24 Follow-Up Specialty Provider:: Dr. Dennise Do you need transportation to your follow-up appointment?: No Do you  understand care options if your condition(s) worsen?: Yes-patient verbalized understanding  SDOH Interventions Today    Flowsheet Row Most Recent Value  SDOH Interventions   Food Insecurity Interventions Intervention Not Indicated  Housing Interventions Intervention Not Indicated  Transportation Interventions Intervention Not Indicated  Utilities Interventions Intervention Not Indicated   Discussed and offered 30 day TOC program.  Patient  verbally agreed.  The patient has been provided with contact information for the care management team and has been advised to call with any health -related questions or concerns.  The patient verbalized understanding with current plan of care.  The patient is directed to their insurance card regarding availability of benefits coverage.    Arvin Seip RN, BSN, CCM [employer], Population Health Case Manager Phone: [phone]

## 2024-05-18 NOTE — Patient Instructions (Addendum)
 Visit Information  Thank you for taking time to visit with me today. Please don't hesitate to contact me if I can be of assistance to you before our next scheduled telephone appointment.  Our next appointment is by telephone on 05/27/24 at 11 am with Laine Tousey, RN case manager.  Following is a copy of your care plan:   Goals Addressed             This Visit's Progress    [facility] Transitions of Care (TOC) Care Plan       Problems:  Recent Hospitalization for treatment of hypokalemia Home Health services barrier: has not received call from [facility].  Patient states she will call [facility] home health on tomorrow 05/19/24 if she does not hear from them.  Confirmed patient has contact phone number for [facility]  Goal:  Over the next 30 days, the patient will not experience hospital readmission  Interventions:  Transitions of Care: Doctor Visits  - discussed the importance of doctor visits Communication with primary care provider  re: notification of 30 day TOC enrollment Reviewed upcoming provider visits  Medications reviewed and compliance discussed  SDOH assessed Assessed for new/ ongoing symptoms Advised to notify provider for ongoing nausea/ vomiting symptoms Advised to call 911 for chest pain/ severed SOB Fall precautions discussed- advised to use ambulatory device as recommended Encourage potassium rich foods: bananas, oranges, potatoes, spinach Explained potassium's role in muscle / cardiac function ( signs to report: irregular heartbeat, muscle weaknes, cramps, dizziness) Advised to contact [facility] home health on 05/19/24 if not contacted by them.  Confirmed patient has [facility] home health contact phone number.   Patient Self Care Activities:  Attend all scheduled provider appointments Call pharmacy for medication refills 3-7 days in advance of running out of medications Call provider office for new concerns or questions  Notify RN Care Manager of TOC call rescheduling  needs Participate in Transition of Care Program/Attend TOC scheduled calls Take medications as prescribed   Follow fall precautions: clear pathways, wear non skid footwear, keep areas such as hallways/ bathrooms well lit. Use ambulatory device as recommended.  Consider eating potassium rich foods: bananas, oranges, potatoes, spinach Notify provider for ongoing nausea/ vomiting symptoms Call 911 for severe SOB/ chest pain Contact [facility] home health to confirm PT/OT services on 05/19/24 at [phone].   If unable to confirm services contact your Laine Tousey, RN case manager [phone]  Plan:  Telephone follow up appointment with care management team member scheduled for:  05/27/24 at 11 am with Laine Tousey, RN case manager The patient has been provided with contact information for the care management team and has been advised to call with any health related questions or concerns.         Patient verbalizes understanding of instructions and care plan provided today and agrees to view in MyChart. Active MyChart status and patient understanding of how to access instructions and care plan via MyChart confirmed with patient.     The patient has been provided with contact information for the care management team and has been advised to call with any health related questions or concerns.   Please call the care guide team at [phone] if you need to cancel or reschedule your appointment.   Please call the Suicide and Crisis Lifeline: 988 call the [place]  National Suicide Prevention Lifeline: 339-360-8536 or TTY: 620-199-7988 TTY 331-125-0048) to talk to a trained counselor call 1-800-273-TALK (toll free, 24 hour hotline) if you are experiencing a Mental Health or  Behavioral Health Crisis or need someone to talk to.  Arvin Seip RN, BSN, CCM [employer], Population Health Case Manager Phone: [phone]

## 2024-05-20 ENCOUNTER — Encounter: Payer: Self-pay | Admitting: Physician Assistant

## 2024-05-21 ENCOUNTER — Ambulatory Visit: Payer: Self-pay

## 2024-05-21 ENCOUNTER — Encounter: Payer: Self-pay | Admitting: Internal Medicine

## 2024-05-21 NOTE — Progress Notes (Signed)
 Subjective:    Patient ID: Brooke [name], female    DOB: Jun 24, 1951, 73 y.o.   MRN: [id number]     HPI Kyliah is here for follow up from the hospital.  Admitted 10/22-10/26  She has a history of diabetes, CKD 3B, GERD, hypertension, moderate persistent asthma who presented to the ED complaining of hypokalemia.  She was hospitalized 10/8-10/14 and was at her hospital follow-up visit and was noted to have hypokalemia and it was advised that she go to the emergency room for further treatment.  She has mild generalized weakness, fatigue.  She denies any nausea, vomiting or diarrhea.  In the ED her potassium was 2.7, magnesium  1.3.  She was admitted for further evaluation of hypokalemia, hypomagnesemia and generalized weakness.   Hypokalemia, hypomagnesemia: Admitted with potassium 2.7, magnesium  1.3 Was having multiple watery, loose stools which improved during hospitalization Diarrhea was likely the etiology of electrolyte abnormalities,  ?  Related to some of her medication Electrolytes were repleted Follow-up blood work as an outpatient Has follow-up with nephrology  Generalized weakness: 4-5 days of generalized weakness prior to being admitted-no focal weakness Possibly related to electrolyte abnormalities and diarrhea Slowly improved during hospitalization Urinalysis negative for infection Chest x-ray negative for infection PT, OT saw patient and recommended home health therapy  CKD stage 3b, metabolic acidosis: Noted to have nonanion gap metabolic acidosis with evidence of hyperchloremia on admission Improved on bicarb tablets Home regimen bicarb tablets increased to 1300 mg twice daily Follow-up with nephrology  Well-controlled type 2 diabetes: Hemoglobin 6.2% in August Jardiance  held during hospitalization, but resumed upon discharge Patient maintained on Pepcid   Hypertension: BP remained controlled on metoprolol  during hospitalization Home diltiazem  and  hydralazine  were held during hospitalization Diltiazem  resumed upon discharge Follow-up BP as an outpatient  Moderate persistent asthma: Stable Trelegy daily, albuterol  as needed  Diarrhea: Was experiencing multiple watery stools prior to hospitalization and during hospitalization C. difficile PCR negative, GI pathogen PCR negative Placed on Imodium as needed  Hypophosphatemia: Repleted   Since to have N/V since discharge.  Can drink water , pediatlyte, gatorade and broth.    Sees neprho 11/4   Discussed the use of AI scribe software for clinical note transcription with the patient, who gave verbal consent to proceed.  History of Present Illness Brooke [name] is a 73 year old female who presents with weakness and vomiting.  Since discharge, she has not experienced any further diarrhea.  Post-discharge, she experiences persistent fatigue and weakness, describing her recovery as 'slow.' Her blood pressure readings at home have been high, with a recent reading of 168/88 mmHg.  She does not recall any of her other readings.    Since returning home, she has ongoing nausea and vomiting, unable to retain food except for broth and applesauce. The vomiting began immediately after discharge and continues to the present. She can keep down water  and Pedialyte. She associates stomach pain with taking liquid potassium supplements, which she has since discontinued, and requests pill form instead.  No fever, lightheadedness, headaches, swallowing difficulties, heartburn, reflux, chest pain, palpitations, leg swelling, constipation, or blood in stool. No shortness of breath, coughing, or wheezing. She has not taken potassium since October 28th and is not currently on omeprazole  or hydralazine .  She uses anti-nausea medication, which provides relief.     Medications and allergies reviewed with patient and updated if appropriate.  Current Outpatient Medications on File Prior to Visit   Medication Sig Dispense Refill  albuterol  (VENTOLIN  HFA) 108 (90 Base) MCG/ACT inhaler Inhale 2 puffs into the lungs every 4 (four) hours as needed for wheezing or shortness of breath. 18 g 1   allopurinol  (ZYLOPRIM ) 100 MG tablet TAKE 1 TABLET BY MOUTH EVERY DAY 90 tablet 0   atropine 1 % ophthalmic solution Place 1 drop into the left eye daily.     Azelastine  HCl 137 MCG/SPRAY SOLN PLACE 2 SPRAYS INTO BOTH NOSTRILS 2 (TWO) TIMES DAILY. USE IN EACH NOSTRIL AS DIRECTED (Patient taking differently: Place 2 sprays into the nose in the morning and at bedtime.) 90 mL 4   brimonidine  (ALPHAGAN ) 0.2 % ophthalmic solution 3 (three) times daily. 1 drop in the left eye three times daily (Patient taking differently: Place 1 drop into the left eye in the morning and at bedtime.)     cetirizine  (ZYRTEC ) 10 MG tablet TAKE 1 TABLET BY MOUTH EVERY DAY 90 tablet 1   citalopram  (CELEXA ) 40 MG tablet TAKE 1 TABLET BY MOUTH EVERY DAY 90 tablet 3   clonazePAM  (KLONOPIN ) 0.5 MG tablet Take 1 tablet (0.5 mg total) by mouth daily as needed (vertigo). 90 tablet 1   colchicine  0.6 MG tablet TAKE 0.5 TABLETS (0.3 MG TOTAL) BY MOUTH DAILY AS NEEDED (GOUT OR PSUEDOGOUT PAIN). 45 tablet 1   diltiazem  (CARDIZEM  CD) 240 MG 24 hr capsule Take 1 capsule (240 mg total) by mouth daily. 90 capsule 3   dorzolamide  (TRUSOPT ) 2 % ophthalmic solution Place 1 drop into the left eye 3 (three) times daily.     famotidine  (PEPCID ) 20 MG tablet Take 1 tablet (20 mg total) by mouth 2 (two) times daily. 60 tablet 5   Fluticasone -Umeclidin-Vilant (TRELEGY ELLIPTA ) 200-62.5-25 MCG/ACT AEPB Inhale 1 puff into the lungs daily. 28 each 5   [Paused] hydrALAZINE  (APRESOLINE ) 25 MG tablet TAKE 1 TABLET BY MOUTH THREE TIMES A DAY 270 tablet 0   JARDIANCE  25 MG TABS tablet TAKE 1 TABLET BY MOUTH EVERY DAY BEFORE BREAKFAST (Patient taking differently: Take 25 mg by mouth daily before breakfast.) 90 tablet 3   latanoprost  (XALATAN ) 0.005 % ophthalmic  solution Place 1 drop into the left eye at bedtime.     levocetirizine (XYZAL ) 5 MG tablet Take 1 tablet (5 mg total) by mouth daily. 30 tablet 5   loperamide (IMODIUM) 2 MG capsule Take 1 capsule (2 mg total) by mouth as needed for diarrhea or loose stools.     meclizine  (ANTIVERT ) 25 MG tablet TAKE 1 TABLET BY MOUTH 3 TIMES A DAY AS NEEDED FOR DIZZINESS (Patient taking differently: Take 25 mg by mouth 3 (three) times daily as needed for dizziness.) 90 tablet 1   metoprolol  succinate (TOPROL -XL) 100 MG 24 hr tablet TAKE 1 TABLET BY MOUTH 2 (TWO) TIMES DAILY. TAKE WITH OR IMMEDIATELY FOLLOWING A MEAL. (Patient taking differently: Take 100 mg by mouth in the morning and at bedtime.) 180 tablet 0   [Paused] omeprazole  (PRILOSEC) 40 MG capsule Take 1 capsule (40 mg total) by mouth daily. (Patient taking differently: Take 40 mg by mouth See admin instructions. Take 40 mg by mouth 30 minutes prior to breakfast and supper) 90 capsule 3   ondansetron  (ZOFRAN -ODT) 4 MG disintegrating tablet Take 1 tablet (4 mg total) by mouth every 8 (eight) hours as needed for nausea or vomiting. (Patient taking differently: Take 4 mg by mouth every 8 (eight) hours as needed for nausea or vomiting (dissolve orally).) 30 tablet 0   potassium chloride  (KLOR-CON ) 20 MEQ  packet Take 40 mEq by mouth daily. 30 each 1   rosuvastatin  (CRESTOR ) 40 MG tablet TAKE 1 TABLET BY MOUTH EVERY DAY 90 tablet 1   sodium bicarbonate  650 MG tablet Take 2 tablets (1,300 mg total) by mouth 2 (two) times daily. 120 tablet 1   traZODone  (DESYREL ) 100 MG tablet TAKE 1 TABLET (100 MG TOTAL) BY MOUTH AT BEDTIME AS NEEDED. FOR SLEEP (Patient taking differently: Take 100 mg by mouth at bedtime.) 90 tablet 1   TYLENOL  500 MG tablet Take 1,000 mg by mouth every 8 (eight) hours as needed for mild pain (pain score 1-3) (or headaches).     No current facility-administered medications on file prior to visit.     Review of Systems  Constitutional:  Positive  for appetite change (decreased). Negative for fever.  HENT:  Negative for trouble swallowing.   Respiratory:  Negative for cough, shortness of breath and wheezing.   Cardiovascular:  Negative for chest pain, palpitations and leg swelling.  Gastrointestinal:  Positive for nausea and vomiting. Negative for abdominal pain (she did when she took potassium - stopped taking it), blood in stool (no melena), constipation and diarrhea.       No gerd  Neurological:  Negative for light-headedness and headaches.       Objective:   Vitals:   05/22/24 1054  BP: (!) 124/58  Pulse: 60  Temp: 98.4 F (36.9 C)  SpO2: 96%   BP Readings from Last 3 Encounters:  05/22/24 (!) 124/58  05/17/24 137/69  05/12/24 122/60   Wt Readings from Last 3 Encounters:  05/22/24 175 lb (79.4 kg)  05/17/24 178 lb 2.1 oz (80.8 kg)  05/12/24 172 lb (78 kg)   Body mass index is 31 kg/m.    Physical Exam Constitutional:      General: She is not in acute distress.    Appearance: Normal appearance.  HENT:     Head: Normocephalic and atraumatic.  Eyes:     Conjunctiva/sclera: Conjunctivae normal.  Cardiovascular:     Rate and Rhythm: Normal rate and regular rhythm.     Heart sounds: Normal heart sounds.  Pulmonary:     Effort: Pulmonary effort is normal. No respiratory distress.     Breath sounds: Normal breath sounds. No wheezing.  Abdominal:     General: There is no distension.     Palpations: Abdomen is soft.     Tenderness: There is no abdominal tenderness.  Musculoskeletal:     Cervical back: Neck supple.     Right lower leg: No edema.     Left lower leg: No edema.  Lymphadenopathy:     Cervical: No cervical adenopathy.  Skin:    General: Skin is warm and dry.     Findings: No rash.  Neurological:     Mental Status: She is alert. Mental status is at baseline.  Psychiatric:        Mood and Affect: Mood normal.        Behavior: Behavior normal.        Lab Results  Component Value Date    WBC 7.4 05/17/2024   HGB 8.0 (L) 05/17/2024   HCT 27.2 (L) 05/17/2024   PLT 384 05/17/2024   GLUCOSE 76 05/17/2024   CHOL 128 10/02/2023   TRIG 91.0 10/02/2023   HDL 49.40 10/02/2023   LDLDIRECT 139.1 09/05/2009   LDLCALC 61 10/02/2023   ALT 9 05/13/2024   AST 17 05/13/2024   NA 144 05/17/2024  K 3.9 05/17/2024   CL 117 (H) 05/17/2024   CREATININE 1.25 (H) 05/17/2024   BUN 6 (L) 05/17/2024   CO2 19 (L) 05/17/2024   TSH 2.000 05/13/2024   INR 1.0 09/24/2018   HGBA1C 6.2 02/26/2024   MICROALBUR 1.1 10/02/2023     Assessment & Plan:    See Problem List for Assessment and Plan of chronic medical problems.    Assessment and Plan Assessment & Plan Nausea and vomiting with weakness and fatigue Persistent nausea and vomiting likely causing weakness due to poor nutrition. - Encourage small, frequent meals with soft foods. - Use anti-nausea medication as needed. - encouraged advancing diet slowly - maintain good hydration  Electrolyte abnormalities: hypokalemia, hypomagnesemia, and other disorders of phosphorus metabolism Risk of electrolyte imbalance due to vomiting and poor intake. Liquid potassium caused stomach pain, leading to non-compliance. - Prescribe potassium in pill form. - Order blood work for potassium, magnesium , and other electrolytes today -- sees nephrology on 11/4 so this can be repeated. - encouraged advancing diet slowly - Monitor electrolyte levels closely.  Chronic kidney disease, stage 3b with anemia Chronic kidney disease with anemia. - stay hydrated - Follow up with nephrologist on November 4th.  Essential hypertension Home blood pressure readings high; office reading normal. Recent medication adjustment. - Continue current antihypertensive regimen - Diltiazem  240 mg daily, metoprolol  xl 100 mg daily. - continue to hold hydralazine  for now - Monitor blood pressure at home regularly.  Diarrhea  - resolved

## 2024-05-21 NOTE — Telephone Encounter (Signed)
  FYI Only or Action Required?: Action required by provider: update on patient condition and appt. Scheduled for 05/22/2024.  Patient was last seen in primary care on 05/12/2024 by Alvia Corean CROME, FNP.  Called Nurse Triage reporting Vomiting, Fatigue, and Hypertension.  Symptoms began several days ago.  Interventions attempted: Rest, hydration, or home remedies.  Symptoms are: unchanged.  Triage Disposition: see physician within 24 hours  Patient/caregiver understands and will follow disposition?: Yes Copied from CRM #[id number]. Topic: Clinical - Red Word Triage >> May 21, 2024  9:38 AM Adelita E wrote: [facility] that prompted transfer to Nurse Triage: Patient got out of the hospital 10/26, but is feeling weak, throwing up, and has elevated blood pressure 162/88 today 10/30. Reason for Disposition  Systolic BP >= 160 OR Diastolic >= 100  [8] MILD or MODERATE vomiting AND [2] present > 48 hours (2 days)  (Exception: Mild vomiting with associated diarrhea.)  Answer Assessment - Initial Assessment Questions 1. VOMITING SEVERITY: How many times have you vomited in the past 24 hours?      Vomited x 1 today, twice yesterday 2. ONSET: When did the vomiting begin?      05/17/2024, after hospital d/c 3. FLUIDS: What fluids or food have you vomited up today? Have you been able to keep any fluids down?     Fluids only, unable to keep food down.  Patient is keeping fluids down 4. ABDOMEN PAIN: Are your having any abdomen pain? If Yes : How bad is it and what does it feel like? (e.g., crampy, dull, intermittent, constant)      Denies abdominal pain 5. DIARRHEA: Is there any diarrhea? If Yes, ask: How many times today?      Did have in hospital, but not at home since d/c on 10/26 6. CONTACTS: Is there anyone else in the family with the same symptoms?      Recent d/c from hospital 7. CAUSE: What do you think is causing your vomiting?     unknown 8. HYDRATION STATUS:  Any signs of dehydration? (e.g., dry mouth [not only dry lips], too weak to stand) When did you last urinate?     Denies all s/s dehydration 9. OTHER SYMPTOMS: Do you have any other symptoms? (e.g., fever, headache, vertigo, vomiting blood or coffee grounds, recent head injury)     Denies all of these symptoms  Answer Assessment - Initial Assessment Questions 1. BLOOD PRESSURE: What is your blood pressure? Did you take at least two measurements 5 minutes apart?     162/88, after medication 2. ONSET: When did you take your blood pressure?     This morning, after medication 3. HOW: How did you take your blood pressure? (e.g., automatic home BP monitor, visiting nurse)     Automatic cuff 4. HISTORY: Do you have a history of high blood pressure?     yes 5. MEDICINES: Are you taking any medicines for blood pressure? Have you missed any doses recently?     Taking all medications, no missed dosage 6. OTHER SYMPTOMS: Do you have any symptoms? (e.g., blurred vision, chest pain, difficulty breathing, headache, weakness)     denies  Protocols used: Vomiting-A-AH, Blood Pressure - High-A-AH

## 2024-05-21 NOTE — Patient Instructions (Incomplete)
      Blood work was ordered.       Medications changes include :   potassium pills twice a day

## 2024-05-22 ENCOUNTER — Ambulatory Visit (INDEPENDENT_AMBULATORY_CARE_PROVIDER_SITE_OTHER): Admitting: Internal Medicine

## 2024-05-22 ENCOUNTER — Ambulatory Visit: Admitting: Internal Medicine

## 2024-05-22 VITALS — BP 124/58 | HR 60 | Temp 98.4°F | Ht 63.0 in | Wt 175.0 lb

## 2024-05-22 DIAGNOSIS — N1832 Chronic kidney disease, stage 3b: Secondary | ICD-10-CM

## 2024-05-22 DIAGNOSIS — I10 Essential (primary) hypertension: Secondary | ICD-10-CM

## 2024-05-22 DIAGNOSIS — E1122 Type 2 diabetes mellitus with diabetic chronic kidney disease: Secondary | ICD-10-CM

## 2024-05-22 DIAGNOSIS — R112 Nausea with vomiting, unspecified: Secondary | ICD-10-CM

## 2024-05-22 DIAGNOSIS — E876 Hypokalemia: Secondary | ICD-10-CM

## 2024-05-22 DIAGNOSIS — R197 Diarrhea, unspecified: Secondary | ICD-10-CM

## 2024-05-22 LAB — MAGNESIUM: Magnesium: 1.7 mg/dL (ref 1.5–2.5)

## 2024-05-22 LAB — BASIC METABOLIC PANEL WITH GFR
BUN: 10 mg/dL (ref 6–23)
CO2: 27 meq/L (ref 19–32)
Calcium: 8.4 mg/dL (ref 8.4–10.5)
Chloride: 107 meq/L (ref 96–112)
Creatinine, Ser: 1.38 mg/dL — ABNORMAL HIGH (ref 0.40–1.20)
GFR: 38.28 mL/min — ABNORMAL LOW (ref >60.00)
Glucose, Bld: 88 mg/dL (ref 70–99)
Potassium: 2.9 meq/L — ABNORMAL LOW (ref 3.5–5.1)
Sodium: 143 meq/L (ref 135–145)

## 2024-05-22 LAB — PHOSPHORUS: Phosphorus: 1.8 mg/dL — ABNORMAL LOW (ref 2.3–4.6)

## 2024-05-22 MED ORDER — POTASSIUM CHLORIDE CRYS ER 20 MEQ PO TBCR: 20.0000 meq | EXTENDED_RELEASE_TABLET | Freq: Two times a day (BID) | ORAL | 5 refills | Status: DC

## 2024-05-24 ENCOUNTER — Ambulatory Visit: Payer: Self-pay | Admitting: Internal Medicine

## 2024-05-24 NOTE — Telephone Encounter (Signed)
 Your potassium is low - you need to take the pills as prescribed.  Your phosphorus level is low.  Your kidney function is stable.

## 2024-05-27 ENCOUNTER — Other Ambulatory Visit: Payer: Self-pay | Admitting: *Deleted

## 2024-05-27 NOTE — Patient Instructions (Signed)
 Visit Information  Thank you for taking time to visit with me today. Please don't hesitate to contact me if I can be of assistance to you before our next scheduled telephone appointment.  Our next appointment is by telephone on Tuesday, June 02, 2024 at 11:00 am  Please call the care guide team at [phone] if you need to cancel or reschedule your appointment.   Following are the goals we discussed today:  Patient Self Care Activities:  Attend all scheduled provider appointments Call pharmacy for medication refills 3-7 days in advance of running out of medications Call provider office for new concerns or questions  Participate in Transition of Care Program/Attend TOC scheduled calls Take medications as prescribed   Follow fall precautions: clear pathways, wear non skid footwear, keep areas such as hallways/ bathrooms well lit. Use ambulatory device as recommended.  Consider eating potassium rich foods: bananas, oranges, potatoes, spinach Notify provider for ongoing nausea/ vomiting symptoms Call 911 for severe SOB/ chest pain Continue working with the home health team that is involved in your care Take all recorded blood pressures from home monitoring to upcoming cardiology provider office visit Monday 06/01/24 for your doctor to review If you believe your condition is getting worse- contact your care providers (doctors) promptly- reaching out to your doctor early when you have concerns can prevent you from having to go to the hospital  If you are experiencing a Mental Health or Behavioral Health Crisis or need someone to talk to, please  call the Suicide and Crisis Lifeline: 988 call the [place]  National Suicide Prevention Lifeline: 340-480-8946 or TTY: (908) 737-3784 TTY 628-641-6587) to talk to a trained counselor call 1-800-273-TALK (toll free, 24 hour hotline) go to [facility] [address] [place] [phone]) call the [facility] Crisis Line: ([phone] call 911   Care plan and visit instructions communicated with the patient verbally today. Patient agrees to receive a copy in MyChart. Active MyChart status and patient understanding of how to access instructions and care plan via MyChart confirmed with patient.     Manoj Enriquez Mckinney Tayshon Winker, RN, BSN, Media Planner  Transitions of Care  [facility] - [facility] [phone]: direct office

## 2024-05-27 NOTE — Transitions of Care (Post Inpatient/ED Visit) (Signed)
 Transition of Care week 2/ day # 9  Visit Note  05/27/2024  Name: Brooke [name] MRN: [id number]          DOB: 08-03-1950  Situation: Patient enrolled in [facility] 30-day program. Visit completed with patient, home health PT Renda and patient's son Arley by telephone  HIPAA identifiers x 2 verified  Background:  Recent hospitalization October 21-26, 2025 for hypokalemia and hypomagnesemia; weakness Essentially independent; resides with supportive adult son and daughter-in-law; uses [facility] at baseline for transportation- well established with [facility]; family assists with transportation as indicated; currently using walker for ambulation (did not use regularly prior to recent hospital admission) Fragile state of health, multiple progressing chronic health conditions (4) unplanned hospital admissions x last (6)/ (12) months  Initial Transition Care Management Follow-up Telephone Call Discharge Date and Diagnosis: 05/17/24, hypokalemia   Past Medical History:  Diagnosis Date   Alopecia    Anxiety    Aortic atherosclerosis    Asthma    FOLLOWED BY PCP   Eczema    GERD (gastroesophageal reflux disease)    Gout    04-28-2018--- per pt stable , as been a while since last episode   Heart murmur    History of colon polyps    History of syncope 2015   Hyperlipidemia    Hypertension    Hypothyroidism    OA (osteoarthritis) of knee    bilateral   PVC's (premature ventricular contractions)    Toxic multinodular goiter    03/ 2003  s/p RAI   Type 2 diabetes mellitus (HCC)    followed by pcp   Ventricular tachycardia, polymorphic (HCC) 01/04/2014   primary cardiologist-- dr wilbert turner (hx monitor 2015 showed couplet PVCs, as trigger)   Wears glasses    Wears partial dentures    upper   Assessment:  I am doing fine I guess, I feel fine but my blood pressures have been running low; it was 76/43 this morning and the home health PT just got here for our first visit: she tells me it is up  to 80/52; the kidney doctor yesterday told me to decrease the metoprolol   and take it only once a day, not twice a day, so that is what I am doing.  I am not lightheaded and am not dizzy, just still weak feeling so I am using my walker; I have not had any new falls- but I had a near fall earlier this week- my son and daughter-in-law had to support me and get me to a chair.  I don't want to spend a lot of time on the phone right now, I want to work with the PT who is here right now.  Just call me next week after the heart doctor appointment  Care Coordination outreach with home health PT Renda who reports patient looks good, no concerns on my end; confirms current BP at home 80/52 with no shortness of breath or clamminess, looks good, I have no concerns Spoke with patient and son and confirmed patient currently taking metoprolol  100 mg every day (NOT BID as previously ordered); encouraged son to monitor patient's condition frequently and double-check home blood pressure readings behind patient; provided education around signs/ symptoms low blood pressure along with corresponding action plan Discussed with son that it is likely in patient's best interest to avoid public transportation if possible for upcoming cardiology provider appointment 06/01/24: he reports he is able and will provide transportation to the scheduled appointment: discussed importance of attending  appointment with patient if possible, he state he will do Care coordination outreach to patient's cardiology and PCP providers to inform of all of the above: encouraged patient/ son to keep phone near patient today, should provider team have additional feedback/ instructions           Patient Reported Symptoms: Cognitive Cognitive Status: Normal speech and language skills, Alert and oriented to person, place, and time, Insightful and able to interpret abstract concepts Cognitive/Intellectual Conditions Management [RPT]: None reported or  documented in medical history or problem list      Neurological Neurological Review of Symptoms: No symptoms reported    HEENT HEENT Symptoms Reported: No symptoms reported      Cardiovascular Cardiovascular Symptoms Reported: Other: Other Cardiovascular Symptoms: My blood pressure has been running low and the kidney doctor yesterday told me to decrease the metoprolol  and take it only once a day, not twice a day, so that is what I am doing.  The PT is here in my home now and she tells me it 80/52 right now.  I am not lightheaded and am not dizzy  Care Coordination outreach with home health PT Renda who reports patient looks good, no concerns on my end; and made care coordination outreach to patient's cardiology and PCP providers as per care plan narrative Does patient have uncontrolled Hypertension?: No Cardiovascular Management Strategies: Medication therapy, Routine screening, Coping strategies, Adequate rest, Activity  Respiratory Respiratory Symptoms Reported: No symptoms reported Other Respiratory Symptoms: Denies shortness of breath/ cough and sounds to be in no respiratory distress throughout [facility] call; home health PT reports patient does not appear to be short of breath at time of TOC call    Endocrine Endocrine Symptoms Reported: No symptoms reported Is patient diabetic?: Yes Is patient checking blood sugars at home?: Yes (Patient declines review today: wishes to work with home health PT who just arrived a few mminutes ago)    Gastrointestinal Gastrointestinal Symptoms Reported: No symptoms reported (declines detailed questions: reports wants to work with home health PT, states I am feeling fine despite reported recent low blood pressure values at home)      Genitourinary Genitourinary Symptoms Reported: No symptoms reported    Integumentary Integumentary Symptoms Reported: No symptoms reported    Musculoskeletal Musculoskelatal Symptoms Reviewed: Weakness Additional  Musculoskeletal Details: confirmed currently requiring/ using assistive devices for ambulation - walker: working with home health PT at time of [facility] call today; denies new/ recent falls- but reports I did have a near fall earlier this week, but my son and daughter-in-law were able to help me and they kept me from falling Musculoskeletal Management Strategies: Coping strategies, Medical device, Routine screening   Fall risk Follow up: Falls prevention discussed, Education provided  Psychosocial Psychosocial Symptoms Reported: No symptoms reported     Quality of Family Relationships: supportive, involved, helpful   There were no vitals filed for this visit.  Medications Reviewed Today     Reviewed by Joshalyn Ancheta M, RN (Registered Nurse) on 05/27/24 at 1146  Med List Status: <None>   Medication Order Taking? Sig Documenting Provider Last Dose Status Informant  albuterol  (VENTOLIN  HFA) 108 (90 Base) MCG/ACT inhaler [id number]  Inhale 2 puffs into the lungs every 4 (four) hours as needed for wheezing or shortness of breath. Jeneal Danita Macintosh, MD  Active Self, Pharmacy Records  allopurinol  (ZYLOPRIM ) 100 MG tablet [id number]  TAKE 1 TABLET BY MOUTH EVERY DAY Norleen Lynwood ORN, MD  Active Self, Pharmacy Records  atropine  1 % ophthalmic solution [id number]  Place 1 drop into the left eye daily. [provider]  Active Self, Pharmacy Records  Azelastine  HCl 137 MCG/SPRAY SOLN [id number]  PLACE 2 SPRAYS INTO BOTH NOSTRILS 2 (TWO) TIMES DAILY. USE IN EACH NOSTRIL AS DIRECTED  Patient taking differently: Place 2 sprays into the nose in the morning and at bedtime.   Norleen Lynwood ORN, MD  Active Self, Pharmacy Records  brimonidine  (ALPHAGAN ) 0.2 % ophthalmic solution [id number]  3 (three) times daily. 1 drop in the left eye three times daily  Patient taking differently: Place 1 drop into the left eye in the morning and at bedtime.   [provider]  Active Self, Pharmacy Records   cetirizine  (ZYRTEC ) 10 MG tablet [id number]  TAKE 1 TABLET BY MOUTH EVERY DAY Padgett, Danita Macintosh, MD  Active Self, Pharmacy Records  citalopram  (CELEXA ) 40 MG tablet [id number]  TAKE 1 TABLET BY MOUTH EVERY DAY Norleen Lynwood ORN, MD  Active Self, Pharmacy Records  clonazePAM  (KLONOPIN ) 0.5 MG tablet [id number]  Take 1 tablet (0.5 mg total) by mouth daily as needed (vertigo). Norleen Lynwood ORN, MD  Active Self, Pharmacy Records  colchicine  0.6 MG tablet [id number]  TAKE 0.5 TABLETS (0.3 MG TOTAL) BY MOUTH DAILY AS NEEDED (GOUT OR PSUEDOGOUT PAIN). Corey, Evan S, MD  Active Self, Pharmacy Records  diltiazem  (CARDIZEM  CD) 240 MG 24 hr capsule [id number]  Take 1 capsule (240 mg total) by mouth daily. Norleen Lynwood ORN, MD  Active Self, Pharmacy Records  dorzolamide  (TRUSOPT ) 2 % ophthalmic solution [id number]  Place 1 drop into the left eye 3 (three) times daily. [provider]  Active Self, Pharmacy Records  famotidine  (PEPCID ) 20 MG tablet [id number]  Take 1 tablet (20 mg total) by mouth 2 (two) times daily. Jeneal Danita Macintosh, MD  Active Self, Pharmacy Records           Med Note [facility], DUROJAHYE' R   Tue May 12, 2024 10:24 PM)    Fluticasone -Umeclidin-Vilant (TRELEGY ELLIPTA ) 200-62.5-25 MCG/ACT AEPB [id number]  Inhale 1 puff into the lungs daily. Jeneal Danita Macintosh, MD  Active Self, Pharmacy Records  hydrALAZINE  (APRESOLINE ) 25 MG tablet [id number]  TAKE 1 TABLET BY MOUTH THREE TIMES A DAY Norleen Lynwood ORN, MD  Active Self, Pharmacy Records  JARDIANCE  25 MG TABS tablet [id number]  TAKE 1 TABLET BY MOUTH EVERY DAY BEFORE BREAKFAST  Patient taking differently: Take 25 mg by mouth daily before breakfast.   Norleen Lynwood ORN, MD  Active Self, Pharmacy Records  latanoprost  (XALATAN ) 0.005 % ophthalmic solution [id number]  Place 1 drop into the left eye at bedtime. [provider]  Active Self, Pharmacy Records  levocetirizine (XYZAL ) 5 MG tablet [id number]  Take 1 tablet (5 mg total) by  mouth daily. Jeneal Danita Macintosh, MD  Active Self, Pharmacy Records           Med Note [facility], [facility]   Tue May 12, 2024 10:25 PM)    loperamide (IMODIUM) 2 MG capsule [id number]  Take 1 capsule (2 mg total) by mouth as needed for diarrhea or loose stools. Sebastian Toribio GAILS, MD  Active   meclizine  (ANTIVERT ) 25 MG tablet [id number]  TAKE 1 TABLET BY MOUTH 3 TIMES A DAY AS NEEDED FOR DIZZINESS  Patient taking differently: Take 25 mg by mouth 3 (three) times daily as needed for dizziness.   Norleen Lynwood ORN, MD  Active Self, Pharmacy Records  metoprolol  succinate (TOPROL -XL) 100 MG 24 hr tablet [id number]  Yes TAKE 1 TABLET BY MOUTH 2 (TWO) TIMES DAILY. TAKE WITH OR IMMEDIATELY FOLLOWING A MEAL.  Patient taking differently: Take 100 mg by mouth in the morning and at bedtime. 06/06/24: Reports during TOC call- dose was decreased 05/26/24 by nephrology provider due to low BP's post- recent hospital discharge: currently taking 100 mg every day per nephrology provider instruction: cardiology and PCP providers made aware of patient's ongoing reports of low BP readings at home   Shlomo Wilbert SAUNDERS, MD  Active Self, Pharmacy Records  omeprazole  (PRILOSEC) 40 MG capsule [id number]  Take 1 capsule (40 mg total) by mouth daily.  Patient taking differently: Take 40 mg by mouth See admin instructions. Take 40 mg by mouth 30 minutes prior to breakfast and supper   Norleen Lynwood ORN, MD  Active Self, Pharmacy Records           Med Note [facility], DUROJAHYE' R   Tue May 12, 2024 10:26 PM)    ondansetron  (ZOFRAN -ODT) 4 MG disintegrating tablet [id number]  Take 1 tablet (4 mg total) by mouth every 8 (eight) hours as needed for nausea or vomiting.  Patient taking differently: Take 4 mg by mouth every 8 (eight) hours as needed for nausea or vomiting (dissolve orally).   Davia Nydia POUR, MD  Active Self, Pharmacy Records  potassium chloride  SA (KLOR-CON  M) 20 MEQ tablet [id number]  Take 1 tablet (20 mEq total) by mouth 2  (two) times daily. Geofm Glade PARAS, MD  Active   rosuvastatin  (CRESTOR ) 40 MG tablet [id number]  TAKE 1 TABLET BY MOUTH EVERY DAY Shlomo Wilbert SAUNDERS, MD  Active Self, Pharmacy Records  sodium bicarbonate  650 MG tablet [id number]  Take 2 tablets (1,300 mg total) by mouth 2 (two) times daily. Sebastian Toribio GAILS, MD  Active   traZODone  (DESYREL ) 100 MG tablet [id number]  TAKE 1 TABLET (100 MG TOTAL) BY MOUTH AT BEDTIME AS NEEDED. FOR SLEEP  Patient taking differently: Take 100 mg by mouth at bedtime.   Norleen Lynwood ORN, MD  Active Self, Pharmacy Records  TYLENOL  500 MG tablet [id number]  Take 1,000 mg by mouth every 8 (eight) hours as needed for mild pain (pain score 1-3) (or headaches). [provider]  Active Self, Pharmacy Records            Recommendation:   Specialty provider follow-up: as scheduled 06/01/24 with cardiology provider Continue Current Plan of Care Continue monitoring blood pressures at home and taking reduced dose of metoprolol  as per instructions from nephrology provider on 05/26/24  Follow Up Plan:   Telephone follow-up in 1 week- as scheduled post cardiology provider office visit 06/01/24- or sooner as indicated around care coordination outreaches/ patient condition as reported today  Pls call/ message for questions,  Senon Nixon Mckinney Laylaa Guevarra, RN, BSN, CCRN Alumnus RN Care Manager  Transitions of Care  [facility] - [facility] [phone]: direct office

## 2024-05-28 ENCOUNTER — Other Ambulatory Visit: Payer: Self-pay | Admitting: *Deleted

## 2024-05-28 NOTE — Transitions of Care (Post Inpatient/ED Visit) (Addendum)
 Transition of Care week 2/ day # 10  Visit Note  05/28/2024  Name: Brooke [name] MRN: [id number]          DOB: February 11, 1951  Situation: Patient enrolled in [facility] 30-day program. Visit completed with patient by telephone.   HIPAA identifiers x 2 verified  Background:  Recent hospitalization October 21-26, 2025 for hypokalemia and hypomagnesemia; weakness Essentially independent; resides with supportive adult son and daughter-in-law; uses [facility] at baseline for transportation- well established with [facility]; family assists with transportation as indicated; currently using walker for ambulation (did not use regularly prior to recent hospital admission) Fragile state of health, multiple progressing chronic health conditions (4) unplanned hospital admissions x last (6)/ (12) months  Initial Transition Care Management Follow-up Telephone Call Discharge Date and Diagnosis: 05/17/24, hypokalemia   Past Medical History:  Diagnosis Date   Alopecia    Anxiety    Aortic atherosclerosis    Asthma    FOLLOWED BY PCP   Eczema    GERD (gastroesophageal reflux disease)    Gout    04-28-2018--- per pt stable , as been a while since last episode   Heart murmur    History of colon polyps    History of syncope 2015   Hyperlipidemia    Hypertension    Hypothyroidism    OA (osteoarthritis) of knee    bilateral   PVC's (premature ventricular contractions)    Toxic multinodular goiter    03/ 2003  s/p RAI   Type 2 diabetes mellitus (HCC)    followed by pcp   Ventricular tachycardia, polymorphic (HCC) 01/04/2014   primary cardiologist-- dr wilbert turner (hx monitor 2015 showed couplet PVCs, as trigger)   Wears glasses    Wears partial dentures    upper   Assessment:    05/28/24: Follow up to [facility] week # 2 call placed yesterday 05/27/24 with care coordination outreach to PCP and cardiology providers: received follow up today regarding care coordination outreaches to providers:  Contacted patient per  cardiology Dr. Shlomo request for same within 20 minutes of provider request Advised patient that she HOLD Jardiance  as of today- she has already taken today but will start holding tomorrow; confirmed plan to attend cardiology office visit as scheduled 06/01/24 Advised to continue holding Hydralazine : reports have been holding since the first hospital visit before this one- haven't taken this for a long time now, since the first October hospital visit a few weeks ago Confirmed with patient- as noted during TOC call yesterday: currently taking toprol  100 mg po every day (decreased from BID during recent nephrology provider office visit 05/26/24)  Patient reports feel better today; reports blood pressure better this morning at 124/73: Advised to make the changes as per cardiology and PCP recommendation/ follow up and to take all home recorded blood pressures to upcoming cardiology provider office visit 06/01/24- re- confirmed patient is aware of appointment and has plans to attend as scheduled- as she reported yesterday: verbalizes understanding to have son transport her rather than taking [facility] if she begins to run low blood pressures again or feels poorly- she verbalizes understanding and agreement with same  Full TOC assessment deferred according to no changes from call yesterday other than better BP and I feel better and more like myself- patient able to re-verbalize all instructions provided today at end of [facility] call without additional prompting and agrees to make all changes as per provider advice: post- visit AVS deferred per patient request: I have written down  everything you said and understand what to do  Patient Reported Symptoms: Cognitive Cognitive Status: Normal speech and language skills, Alert and oriented to person, place, and time, Insightful and able to interpret abstract concepts      Neurological      HEENT        Cardiovascular Cardiovascular Symptoms Reported: No symptoms  reported, Other: Other Cardiovascular Symptoms: My blood pressure is better today and I feel good    Respiratory Respiratory Symptoms Reported: No symptoms reported Other Respiratory Symptoms: Denies shortness of breath and sounds to be in no respiratory distress throughout [facility] call    Endocrine      Gastrointestinal        Genitourinary      Integumentary      Musculoskeletal          Psychosocial           Vitals:   05/28/24 1306  BP: 124/73    Medications Reviewed Today     Reviewed by Rollo Farquhar M, RN (Registered Nurse) on 05/28/24 at 1240  Med List Status: <None>   Medication Order Taking? Sig Documenting Provider Last Dose Status Informant  albuterol  (VENTOLIN  HFA) 108 (90 Base) MCG/ACT inhaler [id number]  Inhale 2 puffs into the lungs every 4 (four) hours as needed for wheezing or shortness of breath. Jeneal Danita Macintosh, MD  Active Self, Pharmacy Records  allopurinol  (ZYLOPRIM ) 100 MG tablet [id number]  TAKE 1 TABLET BY MOUTH EVERY DAY Norleen Lynwood ORN, MD  Active Self, Pharmacy Records  atropine 1 % ophthalmic solution [id number]  Place 1 drop into the left eye daily. [provider]  Active Self, Pharmacy Records  Azelastine  HCl 137 MCG/SPRAY SOLN [id number]  PLACE 2 SPRAYS INTO BOTH NOSTRILS 2 (TWO) TIMES DAILY. USE IN EACH NOSTRIL AS DIRECTED  Patient taking differently: Place 2 sprays into the nose in the morning and at bedtime.   Norleen Lynwood ORN, MD  Active Self, Pharmacy Records  brimonidine  (ALPHAGAN ) 0.2 % ophthalmic solution [id number]  3 (three) times daily. 1 drop in the left eye three times daily  Patient taking differently: Place 1 drop into the left eye in the morning and at bedtime.   [provider]  Active Self, Pharmacy Records  cetirizine  (ZYRTEC ) 10 MG tablet [id number]  TAKE 1 TABLET BY MOUTH EVERY DAY Padgett, Danita Macintosh, MD  Active Self, Pharmacy Records  citalopram  (CELEXA ) 40 MG tablet [id number]  TAKE 1 TABLET BY  MOUTH EVERY DAY Norleen Lynwood ORN, MD  Active Self, Pharmacy Records  clonazePAM  (KLONOPIN ) 0.5 MG tablet [id number]  Take 1 tablet (0.5 mg total) by mouth daily as needed (vertigo). Norleen Lynwood ORN, MD  Active Self, Pharmacy Records  colchicine  0.6 MG tablet [id number]  TAKE 0.5 TABLETS (0.3 MG TOTAL) BY MOUTH DAILY AS NEEDED (GOUT OR PSUEDOGOUT PAIN). Corey, Evan S, MD  Active Self, Pharmacy Records  diltiazem  (CARDIZEM  CD) 240 MG 24 hr capsule [id number]  Take 1 capsule (240 mg total) by mouth daily. Norleen Lynwood ORN, MD  Active Self, Pharmacy Records  dorzolamide  (TRUSOPT ) 2 % ophthalmic solution [id number]  Place 1 drop into the left eye 3 (three) times daily. [provider]  Active Self, Pharmacy Records  famotidine  (PEPCID ) 20 MG tablet [id number]  Take 1 tablet (20 mg total) by mouth 2 (two) times daily. Jeneal Danita Macintosh, MD  Active Self, Pharmacy Records           Med Note Saunemin, DUROJAHYE' R   Tue  May 12, 2024 10:24 PM)    Fluticasone -Umeclidin-Vilant (TRELEGY ELLIPTA ) 200-62.5-25 MCG/ACT AEPB [id number]  Inhale 1 puff into the lungs daily. Jeneal Danita Macintosh, MD  Active Self, Pharmacy Records  hydrALAZINE  (APRESOLINE ) 25 MG tablet [id number]  TAKE 1 TABLET BY MOUTH THREE TIMES A DAY Norleen Lynwood ORN, MD  Active Self, Pharmacy Records  JARDIANCE  25 MG TABS tablet [id number]  TAKE 1 TABLET BY MOUTH EVERY DAY BEFORE BREAKFAST  Patient taking differently: Take 25 mg by mouth daily before breakfast.   Norleen Lynwood ORN, MD  Active Self, Pharmacy Records  latanoprost  (XALATAN ) 0.005 % ophthalmic solution [id number]  Place 1 drop into the left eye at bedtime. [provider]  Active Self, Pharmacy Records  levocetirizine (XYZAL ) 5 MG tablet [id number]  Take 1 tablet (5 mg total) by mouth daily. Jeneal Danita Macintosh, MD  Active Self, Pharmacy Records           Med Note [facility], DUROJAHYE' R   Tue May 12, 2024 10:25 PM)    loperamide (IMODIUM) 2 MG capsule [id number]  Take 1  capsule (2 mg total) by mouth as needed for diarrhea or loose stools. Sebastian Toribio GAILS, MD  Active   meclizine  (ANTIVERT ) 25 MG tablet [id number]  TAKE 1 TABLET BY MOUTH 3 TIMES A DAY AS NEEDED FOR DIZZINESS  Patient taking differently: Take 25 mg by mouth 3 (three) times daily as needed for dizziness.   Norleen Lynwood ORN, MD  Active Self, Pharmacy Records  metoprolol  succinate (TOPROL -XL) 100 MG 24 hr tablet [id number]  TAKE 1 TABLET BY MOUTH 2 (TWO) TIMES DAILY. TAKE WITH OR IMMEDIATELY FOLLOWING A MEAL.  Patient taking differently: Take 100 mg by mouth in the morning and at bedtime. 06/06/24: Reports during TOC call- dose was decreased 05/26/24 by nephrology provider due to low BP's post- recent hospital discharge: currently taking 100 mg every day per nephrology provider instruction: cardiology and PCP providers made aware of patient's ongoing reports of low BP readings at home   Shlomo Wilbert SAUNDERS, MD  Active Self, Pharmacy Records  omeprazole  (PRILOSEC) 40 MG capsule [id number]  Take 1 capsule (40 mg total) by mouth daily.  Patient taking differently: Take 40 mg by mouth See admin instructions. Take 40 mg by mouth 30 minutes prior to breakfast and supper   Norleen Lynwood ORN, MD  Active Self, Pharmacy Records           Med Note RENNIS, DUROJAHYE' R   Tue May 12, 2024 10:26 PM)    ondansetron  (ZOFRAN -ODT) 4 MG disintegrating tablet [id number]  Take 1 tablet (4 mg total) by mouth every 8 (eight) hours as needed for nausea or vomiting.  Patient taking differently: Take 4 mg by mouth every 8 (eight) hours as needed for nausea or vomiting (dissolve orally).   Davia Nydia POUR, MD  Active Self, Pharmacy Records  potassium chloride  SA (KLOR-CON  M) 20 MEQ tablet [id number]  Take 1 tablet (20 mEq total) by mouth 2 (two) times daily. Geofm Glade PARAS, MD  Active   rosuvastatin  (CRESTOR ) 40 MG tablet [id number]  TAKE 1 TABLET BY MOUTH EVERY DAY Shlomo Wilbert SAUNDERS, MD  Active Self, Pharmacy Records  sodium bicarbonate  650 MG  tablet [id number]  Take 2 tablets (1,300 mg total) by mouth 2 (two) times daily. Sebastian Toribio GAILS, MD  Active   traZODone  (DESYREL ) 100 MG tablet [id number]  TAKE 1 TABLET (100 MG TOTAL) BY MOUTH AT BEDTIME AS NEEDED. FOR SLEEP  Patient taking differently: Take 100  mg by mouth at bedtime.   Norleen Lynwood ORN, MD  Active Self, Pharmacy Records  TYLENOL  500 MG tablet [id number]  Take 1,000 mg by mouth every 8 (eight) hours as needed for mild pain (pain score 1-3) (or headaches). [provider]  Active Self, Pharmacy Records           Recommendation:   Specialty provider follow-up- cardiology provider as scheduled 06/01/24 Continue Current Plan of Care  Follow Up Plan:   Telephone follow-up in 5 days post- upcoming cardiology provider office visit  Pls call/ message for questions,  Beatris Blinda Lawrence, RN, BSN, CCRN Alumnus RN Care Manager  Transitions of Care  [facility] - [facility] [phone]: direct office

## 2024-05-28 NOTE — Progress Notes (Signed)
 Hello- please to stop the hydralazine  if she is taking this.  I would defer to Dr Shlomo regarding other med change.   thanks
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
## 2024-06-01 ENCOUNTER — Ambulatory Visit: Attending: Cardiology | Admitting: Cardiology

## 2024-06-01 ENCOUNTER — Ambulatory Visit

## 2024-06-01 ENCOUNTER — Encounter: Payer: Self-pay | Admitting: Cardiology

## 2024-06-01 VITALS — BP 104/50 | HR 73 | Resp 16 | Ht 63.0 in | Wt 173.4 lb

## 2024-06-01 DIAGNOSIS — Z79899 Other long term (current) drug therapy: Secondary | ICD-10-CM

## 2024-06-01 DIAGNOSIS — I10 Essential (primary) hypertension: Secondary | ICD-10-CM | POA: Diagnosis not present

## 2024-06-01 DIAGNOSIS — I4729 Other ventricular tachycardia: Secondary | ICD-10-CM | POA: Diagnosis not present

## 2024-06-01 DIAGNOSIS — I4719 Other supraventricular tachycardia: Secondary | ICD-10-CM

## 2024-06-01 DIAGNOSIS — R002 Palpitations: Secondary | ICD-10-CM

## 2024-06-01 DIAGNOSIS — I700 Atherosclerosis of aorta: Secondary | ICD-10-CM

## 2024-06-01 DIAGNOSIS — N1831 Chronic kidney disease, stage 3a: Secondary | ICD-10-CM

## 2024-06-01 DIAGNOSIS — R011 Cardiac murmur, unspecified: Secondary | ICD-10-CM

## 2024-06-01 DIAGNOSIS — R0789 Other chest pain: Secondary | ICD-10-CM

## 2024-06-01 DIAGNOSIS — E7800 Pure hypercholesterolemia, unspecified: Secondary | ICD-10-CM

## 2024-06-01 LAB — LIPID PANEL

## 2024-06-01 MED ORDER — DILTIAZEM HCL ER COATED BEADS 180 MG PO CP24: 180.0000 mg | ORAL_CAPSULE | Freq: Every day | ORAL | 3 refills | Status: AC

## 2024-06-01 NOTE — Addendum Note (Signed)
 Addended by: JANIT GENI CROME on: 06/01/2024 08:57 AM   Modules accepted: Orders

## 2024-06-01 NOTE — Progress Notes (Unsigned)
 Enrolled patient for a 14 day Zio XT  monitor to be mailed to patients home

## 2024-06-01 NOTE — Patient Instructions (Signed)
 Medication Instructions:  Please DECREASE your dose of cardizem  CD to 180 mg daily.  *If you need a refill on your cardiac medications before your next appointment, please call your pharmacy*  Lab Work: Please complete a FASTING lipid panel and a CMET today before you leave.  If you have labs (blood work) drawn today and your tests are completely normal, you will receive your results only by: MyChart Message (if you have MyChart) OR A paper copy in the mail If you have any lab test that is abnormal or we need to change your treatment, we will call you to review the results.  Testing/Procedures: GEOFFRY HEWS- Long Term Monitor Instructions  Your physician has requested you wear a ZIO patch monitor for 14 days.  This is a single patch monitor. [employer] supplies one patch monitor per enrollment. Additional stickers are not available. Please do not apply patch if you will be having a Nuclear Stress Test,  Echocardiogram, Cardiac CT, MRI, or Chest Xray during the period you would be wearing the  monitor. The patch cannot be worn during these tests. You cannot remove and re-apply the  ZIO XT patch monitor.  Your ZIO patch monitor will be mailed 3 day USPS to your address on file. It may take 3-5 days  to receive your monitor after you have been enrolled.  Once you have received your monitor, please review the enclosed instructions. Your monitor  has already been registered assigning a specific monitor serial # to you.  Billing and Patient Assistance Program Information  We have supplied [employer] with any of your insurance information on file for billing purposes. [employer] offers a sliding scale Patient Assistance Program for patients that do not have  insurance, or whose insurance does not completely cover the cost of the ZIO monitor.  You must apply for the Patient Assistance Program to qualify for this discounted rate.  To apply, please call [employer] at [phone], select option 4, select option  2, ask to apply for  Patient Assistance Program. Meredeth will ask your household income, and how many people  are in your household. They will quote your out-of-pocket cost based on that information.  [employer] will also be able to set up a 3-month, interest-free payment plan if needed.  Applying the monitor   Shave hair from upper left chest.  Hold abrader disc by orange tab. Rub abrader in 40 strokes over the upper left chest as  indicated in your monitor instructions.  Clean area with 4 enclosed alcohol pads. Let dry.  Apply patch as indicated in monitor instructions. Patch will be placed under collarbone on left  side of chest with arrow pointing upward.  Rub patch adhesive wings for 2 minutes. Remove white label marked 1. Remove the white  label marked 2. Rub patch adhesive wings for 2 additional minutes.  While looking in a mirror, press and release button in center of patch. A small green light will  flash 3-4 times. This will be your only indicator that the monitor has been turned on.  Do not shower for the first 24 hours. You may shower after the first 24 hours.  Press the button if you feel a symptom. You will hear a small click. Record Date, Time and  Symptom in the Patient Logbook.  When you are ready to remove the patch, follow instructions on the last 2 pages of Patient  Logbook. Stick patch monitor onto the last page of Patient Logbook.  Place Patient Logbook in the  blue and white box. Use locking tab on box and tape box closed  securely. The blue and white box has prepaid postage on it. Please place it in the mailbox as  soon as possible. Your physician should have your test results approximately 7 days after the  monitor has been mailed back to [facility].  Call [facility] Customer Care at [phone] if you have questions regarding  your ZIO XT patch monitor. Call them immediately if you see an orange light blinking on your  monitor.  If your monitor falls  off in less than 4 days, contact our Monitor department at [phone].  If your monitor becomes loose or falls off after 4 days call [employer] at [phone] for  suggestions on securing your monitor   Follow-Up: At [facility], you and your health needs are our priority.  As part of our continuing mission to provide you with exceptional heart care, our providers are all part of one team.  This team includes your primary Cardiologist (physician) and Advanced Practice Providers or APPs (Physician Assistants and Nurse Practitioners) who all work together to provide you with the care you need, when you need it.  Your next appointment:   3 month(s)  Provider:   Wilbert Bihari, MD     Other Instructions Please check your blood pressure twice a day for one week. Check your blood pressure once at lunch and once at dinner. Write down your blood pressure readings along with the date and time of each reading, as well as a heart rate if your machine provides that information. Then, drop off your readings at our front desk, send a picture of the log over Mychart or send the readings to us  in the mail. You can also call our main phone number [phone]) as all of our operators are trained to take down your blood pressure readings.

## 2024-06-02 ENCOUNTER — Ambulatory Visit: Payer: Self-pay | Admitting: Internal Medicine

## 2024-06-02 ENCOUNTER — Other Ambulatory Visit: Admitting: *Deleted

## 2024-06-02 ENCOUNTER — Encounter: Payer: Self-pay | Admitting: Internal Medicine

## 2024-06-02 ENCOUNTER — Ambulatory Visit: Payer: Self-pay | Admitting: Cardiology

## 2024-06-02 ENCOUNTER — Ambulatory Visit (INDEPENDENT_AMBULATORY_CARE_PROVIDER_SITE_OTHER): Admitting: Internal Medicine

## 2024-06-02 ENCOUNTER — Other Ambulatory Visit: Payer: Self-pay | Admitting: Internal Medicine

## 2024-06-02 VITALS — BP 128/82 | HR 65 | Temp 98.4°F | Ht 63.0 in | Wt 177.0 lb

## 2024-06-02 DIAGNOSIS — E1122 Type 2 diabetes mellitus with diabetic chronic kidney disease: Secondary | ICD-10-CM

## 2024-06-02 DIAGNOSIS — I10 Essential (primary) hypertension: Secondary | ICD-10-CM

## 2024-06-02 DIAGNOSIS — E876 Hypokalemia: Secondary | ICD-10-CM

## 2024-06-02 DIAGNOSIS — E559 Vitamin D deficiency, unspecified: Secondary | ICD-10-CM | POA: Diagnosis not present

## 2024-06-02 DIAGNOSIS — N1832 Chronic kidney disease, stage 3b: Secondary | ICD-10-CM | POA: Diagnosis not present

## 2024-06-02 LAB — COMPREHENSIVE METABOLIC PANEL WITH GFR
ALT: 16 IU/L (ref 0–32)
AST: 17 IU/L (ref 0–40)
Albumin: 2.5 g/dL — ABNORMAL LOW (ref 3.8–4.8)
Alkaline Phosphatase: 220 IU/L — ABNORMAL HIGH (ref 49–135)
BUN/Creatinine Ratio: 8 — ABNORMAL LOW (ref 12–28)
BUN: 16 mg/dL (ref 8–27)
Bilirubin Total: 0.2 mg/dL (ref 0.0–1.2)
CO2: 21 mmol/L (ref 20–29)
Calcium: 8.3 mg/dL — ABNORMAL LOW (ref 8.7–10.3)
Chloride: 106 mmol/L (ref 96–106)
Creatinine, Ser: 2.11 mg/dL — ABNORMAL HIGH (ref 0.57–1.00)
Globulin, Total: 2.3 g/dL (ref 1.5–4.5)
Glucose: 81 mg/dL (ref 70–99)
Potassium: 2.9 mmol/L — CL (ref 3.5–5.2)
Sodium: 143 mmol/L (ref 134–144)
Total Protein: 4.8 g/dL — ABNORMAL LOW (ref 6.0–8.5)
eGFR: 24 mL/min/1.73 — ABNORMAL LOW (ref >59)

## 2024-06-02 LAB — CBC WITH DIFFERENTIAL/PLATELET
Basophils Absolute: 0 K/uL (ref 0.0–0.1)
Basophils Relative: 0.6 % (ref 0.0–3.0)
Eosinophils Absolute: 0 K/uL (ref 0.0–0.7)
Eosinophils Relative: 0.4 % (ref 0.0–5.0)
HCT: 30 % — ABNORMAL LOW (ref 36.0–46.0)
Hemoglobin: 9.7 g/dL — ABNORMAL LOW (ref 12.0–15.0)
Lymphocytes Relative: 24.2 % (ref 12.0–46.0)
Lymphs Abs: 1.9 K/uL (ref 0.7–4.0)
MCHC: 32.4 g/dL (ref 30.0–36.0)
MCV: 83.4 fl (ref 78.0–100.0)
Monocytes Absolute: 0.9 K/uL (ref 0.1–1.0)
Monocytes Relative: 11.5 % (ref 3.0–12.0)
Neutro Abs: 4.9 K/uL (ref 1.4–7.7)
Neutrophils Relative %: 63.3 % (ref 43.0–77.0)
Platelets: 281 K/uL (ref 150.0–400.0)
RBC: 3.59 Mil/uL — ABNORMAL LOW (ref 3.87–5.11)
RDW: 17.7 % — ABNORMAL HIGH (ref 11.5–15.5)
WBC: 7.7 K/uL (ref 4.0–10.5)

## 2024-06-02 LAB — BASIC METABOLIC PANEL WITH GFR
BUN: 17 mg/dL (ref 6–23)
CO2: 26 meq/L (ref 19–32)
Calcium: 8.1 mg/dL — ABNORMAL LOW (ref 8.4–10.5)
Chloride: 106 meq/L (ref 96–112)
Creatinine, Ser: 2.13 mg/dL — ABNORMAL HIGH (ref 0.40–1.20)
GFR: 22.73 mL/min — ABNORMAL LOW (ref >60.00)
Glucose, Bld: 98 mg/dL (ref 70–99)
Potassium: 3.1 meq/L — ABNORMAL LOW (ref 3.5–5.1)
Sodium: 140 meq/L (ref 135–145)

## 2024-06-02 LAB — HEMOGLOBIN A1C: Hgb A1c MFr Bld: 5.4 % (ref 4.6–6.5)

## 2024-06-02 LAB — LIPID PANEL
Cholesterol, Total: 89 mg/dL — AB (ref 100–199)
HDL: 51 mg/dL (ref >39)
LDL CALC COMMENT:: 1.7 ratio (ref 0.0–4.4)
LDL Chol Calc (NIH): 22 mg/dL (ref 0–99)
Triglycerides: 78 mg/dL (ref 0–149)
VLDL Cholesterol Cal: 16 mg/dL (ref 5–40)

## 2024-06-02 MED ORDER — POTASSIUM CHLORIDE ER 10 MEQ PO TBCR: 10.0000 meq | EXTENDED_RELEASE_TABLET | Freq: Every day | ORAL | 0 refills | Status: DC

## 2024-06-02 NOTE — Patient Instructions (Signed)
 Please continue all other medications as before, and refills have been done if requested.  Please have the pharmacy call with any other refills you may need.  Please continue your efforts at being more active, low cholesterol diet, and weight control.  Please keep your appointments with your specialists as you may have planned - Dr Shlomo, and Kidney doctor Dec 11  Please go to the LAB at the blood drawing area for the tests to be done  You will be contacted by phone if any changes need to be made immediately.  Otherwise, you will receive a letter about your results with an explanation, but please check with MyChart first.

## 2024-06-02 NOTE — Telephone Encounter (Signed)
 Call to Dr. Nicola office to advise of low K+ and elevated Cr. Dr. Nicola office offering appt at 3:40 pm this afternoon. Call to patient who accepts appt and says they had a hard time drawing my blood yesterday, they could not get enough of a sample. Patient verbalizes understanding to come at little early and to bring ID and insurance cards.

## 2024-06-02 NOTE — Patient Instructions (Signed)
 Visit Information  Thank you for taking time to visit with me today. Please don't hesitate to contact me if I can be of assistance to you before our next scheduled telephone appointment.  Our next appointment is by telephone on Thursday 06/11/24 at 11:00 am  Please call the care guide team at [phone] if you need to cancel or reschedule your appointment.   Following are the goals we discussed today:  Patient Self Care Activities:  Attend all scheduled provider appointments Call pharmacy for medication refills 3-7 days in advance of running out of medications Call provider office for new concerns or questions  Participate in Transition of Care Program/Attend TOC scheduled calls Take medications as prescribed   Follow fall precautions: clear pathways, wear non skid footwear, keep areas such as hallways/ bathrooms well lit. Use ambulatory device as recommended.  Consider eating potassium rich foods: bananas, oranges, potatoes, spinach Notify provider for ongoing nausea/ vomiting symptoms Call 911 for severe SOB/ chest pain Continue working with the home health team that is involved in your care Take all recorded blood pressures from home monitoring to your doctor appointments for review If you believe your condition is getting worse- contact your care providers (doctors) promptly- reaching out to your doctor early when you have concerns can prevent you from having to go to the hospital  If you are experiencing a Mental Health or Behavioral Health Crisis or need someone to talk to, please  call the Suicide and Crisis Lifeline: 988 call the [place]  National Suicide Prevention Lifeline: 279-269-0006 or TTY: (629)089-7068 TTY 843-108-6632) to talk to a trained counselor call 1-800-273-TALK (toll free, 24 hour hotline) go to [facility] [address], [place] [phone]) call the [facility] Crisis Line: [phone] call 911   Care plan and  visit instructions communicated with the patient verbally today. Patient agrees to receive a copy in MyChart. Active MyChart status and patient understanding of how to access instructions and care plan via MyChart confirmed with patient.     Marbin Olshefski Mckinney Mouna Yager, RN, BSN, Media Planner  Transitions of Care  [facility] - [facility] [phone]: direct office

## 2024-06-02 NOTE — Assessment & Plan Note (Signed)
 Lab Results  Component Value Date   HGBA1C 5.4 06/02/2024   Stable, pt to continue current medical treatment  - diet, wt control

## 2024-06-02 NOTE — Assessment & Plan Note (Signed)
 Currently stable, cont current med tx,  to f/u any worsening symptoms or concerns

## 2024-06-02 NOTE — Progress Notes (Signed)
 Patient ID: Brooke [name], female   DOB: 05-25-1951, 73 y.o.   MRN: [id number]        Chief Complaint: follow up low potassium and worsening renal insufficiency ckd 3b, dm, low vit d, htn with low bp recently       HPI:  Brooke [name] is a 73 y.o. female here after pt states a difficult blood draw yesterday, found to have low K 2.9 and worsening cr to 2.11 from baseline about 1.3.  Pt no longer taking micardis  or jardiance  or hydralzine .  Denies urinary symptoms such as dysuria, frequency, urgency, flank pain, hematuria or n/v, fever, chills.  Has been drinking fluids, does not seem dehydrated.  Pt denies chest pain, increased sob or doe, wheezing, orthopnea, PND, increased LE swelling, palpitations, dizziness or syncope.   Pt denies polydipsia, polyuria, or new focal neuro s/s.   Has renal appt Dec 11 followup.  Last renal imaging oct 7 with ct abd pelvis without significant renal dz.  Pt denies any n/v or diarrhea, and does not take diuretic to explain the low K.        Wt Readings from Last 3 Encounters:  06/02/24 177 lb (80.3 kg)  06/01/24 173 lb 6.4 oz (78.7 kg)  05/22/24 175 lb (79.4 kg)   BP Readings from Last 3 Encounters:  06/02/24 128/82  06/02/24 123/69  06/01/24 (!) 104/50         Past Medical History:  Diagnosis Date   Alopecia    Anxiety    Aortic atherosclerosis    Asthma    FOLLOWED BY PCP   Eczema    GERD (gastroesophageal reflux disease)    Gout    04-28-2018--- per pt stable , as been a while since last episode   Heart murmur    History of colon polyps    History of syncope 2015   Hyperlipidemia    Hypertension    Hypothyroidism    OA (osteoarthritis) of knee    bilateral   PAT (paroxysmal atrial tachycardia)    Occurred in the setting of ileus and electrolyte abnormalities   PVC's (premature ventricular contractions)    Toxic multinodular goiter    03/ 2003  s/p RAI   Type 2 diabetes mellitus (HCC)    followed by pcp   Ventricular tachycardia,  polymorphic (HCC) 01/04/2014   primary cardiologist-- dr wilbert turner (hx monitor 2015 showed couplet PVCs, as trigger)   Wears glasses    Wears partial dentures    upper   Past Surgical History:  Procedure Laterality Date   ABDOMINAL HYSTERECTOMY  10/22/1999   WITH BSO   CATARACT EXTRACTION W/ INTRAOCULAR LENS IMPLANT Left YRS AGO   COLONOSCOPY     EXCISION ABDOMINAL WALL MASS  12-20-2005   dr merrilyn @[facility]    neruofibroma   LAPAROSCOPIC CHOLECYSTECTOMY  10/01/2002   dr merrilyn @[facility]    LEFT HEART CATHETERIZATION WITH CORONARY ANGIOGRAM N/A 01/07/2014   Procedure: LEFT HEART CATHETERIZATION WITH CORONARY ANGIOGRAM;  Surgeon: Peter M Jordan, MD;  Location[facility] CATH LAB;  Service: Cardiovascular;  Laterality: N/A;   RASTELLI PROCEDURE  6/98 neg   RENAL ARTERY STENT Left 11/2003   angioplasty and stenting   RENAL ARTERY STENT Left 02/2005   re-stenting   TOTAL KNEE ARTHROPLASTY Right 05/05/2018   Procedure: RIGHT TOTAL KNEE ARTHROPLASTY;  Surgeon: Liam Lerner, MD;  Location: [facility] ORS;  Service: Orthopedics;  Laterality: Right;   TOTAL KNEE ARTHROPLASTY Left 09/30/2018   Procedure: TOTAL  KNEE ARTHROPLASTY;  Surgeon: Sheril Coy, MD;  Location: [facility];  Service: Orthopedics;  Laterality: Left;    reports that she has never smoked. She has never been exposed to tobacco smoke. She has never used smokeless tobacco. She reports that she does not drink alcohol and does not use drugs. family history includes Diabetes in her mother; Heart disease in her father. Allergies  Allergen Reactions   Gnp Glp-1 Daily Support [Germanium] Other (See Comments)    Ileus requiring hospitalization   Metformin  And Related Nausea And Vomiting   Ace Inhibitors Swelling and Other (See Comments)    Ankles swell   Codeine Nausea And Vomiting and Rash   Hydrocodone  Nausea And Vomiting   Tramadol  Nausea And Vomiting   Tylenol  [Acetaminophen ] Itching, Rash and Other (See Comments)    Allergic to prescription  strength Tylenol . The patient is taking 500 mg Tylenol  in 2025, however.   Current Outpatient Medications on File Prior to Visit  Medication Sig Dispense Refill   albuterol  (VENTOLIN  HFA) 108 (90 Base) MCG/ACT inhaler Inhale 2 puffs into the lungs every 4 (four) hours as needed for wheezing or shortness of breath. 18 g 1   allopurinol  (ZYLOPRIM ) 100 MG tablet TAKE 1 TABLET BY MOUTH EVERY DAY 90 tablet 0   atropine 1 % ophthalmic solution Place 1 drop into the left eye daily.     Azelastine  HCl 137 MCG/SPRAY SOLN PLACE 2 SPRAYS INTO BOTH NOSTRILS 2 (TWO) TIMES DAILY. USE IN EACH NOSTRIL AS DIRECTED (Patient taking differently: Place 2 sprays into the nose in the morning and at bedtime.) 90 mL 4   brimonidine  (ALPHAGAN ) 0.2 % ophthalmic solution 3 (three) times daily. 1 drop in the left eye three times daily (Patient taking differently: Place 1 drop into the left eye in the morning and at bedtime.)     cetirizine  (ZYRTEC ) 10 MG tablet TAKE 1 TABLET BY MOUTH EVERY DAY 90 tablet 1   citalopram  (CELEXA ) 40 MG tablet TAKE 1 TABLET BY MOUTH EVERY DAY 90 tablet 3   clonazePAM  (KLONOPIN ) 0.5 MG tablet Take 1 tablet (0.5 mg total) by mouth daily as needed (vertigo). 90 tablet 1   colchicine  0.6 MG tablet TAKE 0.5 TABLETS (0.3 MG TOTAL) BY MOUTH DAILY AS NEEDED (GOUT OR PSUEDOGOUT PAIN). 45 tablet 1   diltiazem  (CARDIZEM  CD) 180 MG 24 hr capsule Take 1 capsule (180 mg total) by mouth daily. 90 capsule 3   famotidine  (PEPCID ) 20 MG tablet Take 1 tablet (20 mg total) by mouth 2 (two) times daily. 60 tablet 5   Fluticasone -Umeclidin-Vilant (TRELEGY ELLIPTA ) 200-62.5-25 MCG/ACT AEPB Inhale 1 puff into the lungs daily. 28 each 5   latanoprost  (XALATAN ) 0.005 % ophthalmic solution Place 1 drop into the left eye at bedtime.     levocetirizine (XYZAL ) 5 MG tablet Take 1 tablet (5 mg total) by mouth daily. 30 tablet 5   loperamide (IMODIUM) 2 MG capsule Take 1 capsule (2 mg total) by mouth as needed for diarrhea or  loose stools.     meclizine  (ANTIVERT ) 25 MG tablet TAKE 1 TABLET BY MOUTH 3 TIMES A DAY AS NEEDED FOR DIZZINESS (Patient taking differently: Take 25 mg by mouth 3 (three) times daily as needed for dizziness.) 90 tablet 1   metoprolol  succinate (TOPROL -XL) 100 MG 24 hr tablet TAKE 1 TABLET BY MOUTH 2 (TWO) TIMES DAILY. TAKE WITH OR IMMEDIATELY FOLLOWING A MEAL. (Patient taking differently: Take 100 mg by mouth in the morning and at bedtime.  06/06/24: Reports during TOC call- dose was decreased 05/26/24 by nephrology provider due to low BP's post- recent hospital discharge: currently taking 100 mg every day per nephrology provider instruction: cardiology and PCP providers made aware of patient's ongoing reports of low BP readings at home) 180 tablet 0   [Paused] omeprazole  (PRILOSEC) 40 MG capsule Take 1 capsule (40 mg total) by mouth daily. (Patient not taking: Reported on 06/02/2024) 90 capsule 3   ondansetron  (ZOFRAN -ODT) 4 MG disintegrating tablet Take 1 tablet (4 mg total) by mouth every 8 (eight) hours as needed for nausea or vomiting. (Patient taking differently: Take 4 mg by mouth every 8 (eight) hours as needed for nausea or vomiting (dissolve orally).) 30 tablet 0   potassium chloride  SA (KLOR-CON  M) 20 MEQ tablet Take 1 tablet (20 mEq total) by mouth 2 (two) times daily. 60 tablet 5   rosuvastatin  (CRESTOR ) 40 MG tablet TAKE 1 TABLET BY MOUTH EVERY DAY 90 tablet 1   sodium bicarbonate  650 MG tablet Take 2 tablets (1,300 mg total) by mouth 2 (two) times daily. 120 tablet 1   telmisartan  (MICARDIS ) 80 MG tablet Take 80 mg by mouth daily. (Patient not taking: Reported on 06/02/2024)     traZODone  (DESYREL ) 100 MG tablet TAKE 1 TABLET (100 MG TOTAL) BY MOUTH AT BEDTIME AS NEEDED. FOR SLEEP 90 tablet 1   TYLENOL  500 MG tablet Take 1,000 mg by mouth every 8 (eight) hours as needed for mild pain (pain score 1-3) (or headaches).     No current facility-administered medications on file prior to visit.         ROS:  All others reviewed and negative.  Objective        PE:  BP 128/82 (BP Location: Right Arm, Patient Position: Sitting, Cuff Size: Normal)   Pulse 65   Temp 98.4 F (36.9 C) (Oral)   Ht 5' 3 (1.6 m)   Wt 177 lb (80.3 kg)   SpO2 99%   BMI 31.35 kg/m                 Constitutional: Pt appears in NAD               HENT: Head: NCAT.                Right Ear: External ear normal.                 Left Ear: External ear normal.                Eyes: . Pupils are equal, round, and reactive to light. Conjunctivae and EOM are normal               Nose: without d/c or deformity               Neck: Neck supple. Gross normal ROM               Cardiovascular: Normal rate and regular rhythm.                 Pulmonary/Chest: Effort normal and breath sounds without rales or wheezing.                Abd:  Soft, NT, ND, + BS, no organomegaly               Neurological: Pt is alert. At baseline orientation, motor grossly intact  Skin: Skin is warm. No rashes, no other new lesions, LE edema - none               Psychiatric: Pt behavior is normal without agitation   Micro: none  Cardiac tracings I have personally interpreted today:  none  Pertinent Radiological findings (summarize): none   Lab Results  Component Value Date   WBC 7.7 06/02/2024   HGB 9.7 (L) 06/02/2024   HCT 30.0 (L) 06/02/2024   PLT 281.0 06/02/2024   GLUCOSE 98 06/02/2024   CHOL 89 (L) 06/01/2024   TRIG 78 06/01/2024   HDL 51 06/01/2024   LDLDIRECT 139.1 09/05/2009   LDLCALC 22 06/01/2024   ALT 16 06/01/2024   AST 17 06/01/2024   NA 140 06/02/2024   K 3.1 (L) 06/02/2024   CL 106 06/02/2024   CREATININE 2.13 (H) 06/02/2024   BUN 17 06/02/2024   CO2 26 06/02/2024   TSH 2.000 05/13/2024   INR 1.0 09/24/2018   HGBA1C 5.4 06/02/2024   MICROALBUR 1.1 10/02/2023   Assessment/Plan:  Brooke [name] is a 73 y.o. Black or African American [2] female with  has a past medical history of Alopecia,  Anxiety, Aortic atherosclerosis, Asthma, Eczema, GERD (gastroesophageal reflux disease), Gout, Heart murmur, History of colon polyps, History of syncope (2015), Hyperlipidemia, Hypertension, Hypothyroidism, OA (osteoarthritis) of knee, PAT (paroxysmal atrial tachycardia), PVC's (premature ventricular contractions), Toxic multinodular goiter, Type 2 diabetes mellitus (HCC), Ventricular tachycardia, polymorphic (HCC) (01/04/2014), Wears glasses, and Wears partial dentures.  Vitamin D  deficiency Last vitamin D  Lab Results  Component Value Date   VD25OH 58.89 05/12/2024   Stable, cont oral replacement   Hypokalemia With lab reported yesterday 2.9 K for some reason, now for confirmatory lab today, consider oral replacement  DM2 (diabetes mellitus, type 2) (HCC) Lab Results  Component Value Date   HGBA1C 5.4 06/02/2024   Stable, pt to continue current medical treatment  - diet, wt control   CKD stage 3b, GFR 30-44 ml/min (HCC) With ? Recent worsening - also for f/u confirmatory bmp today, and f/u renal dec 11 as planned  Essential hypertension Currently stable, cont current med tx,  to f/u any worsening symptoms or concerns  Followup: Return in about 3 months (around 09/02/2024).  Lynwood Rush, MD 06/02/2024 8:32 PM [facility]

## 2024-06-02 NOTE — Assessment & Plan Note (Signed)
 With lab reported yesterday 2.9 K for some reason, now for confirmatory lab today, consider oral replacement

## 2024-06-02 NOTE — Assessment & Plan Note (Signed)
 Last vitamin D  Lab Results  Component Value Date   VD25OH 58.89 05/12/2024   Stable, cont oral replacement

## 2024-06-02 NOTE — Transitions of Care (Post Inpatient/ED Visit) (Signed)
 Transition of Care week 3/ day # 14  Visit Note  06/02/2024  Name: Brooke [name] MRN: [id number]          DOB: 1950/12/06  Situation: Patient enrolled in [facility] 30-day program. Visit completed with patient by telephone.   HIPAA identifiers x 2 verified  Background:  Recent hospitalization October 21-26, 2025 for hypokalemia and hypomagnesemia; weakness Essentially independent; resides with supportive adult son and daughter-in-law; uses [facility] at baseline for transportation- well established with [employer]; family assists with transportation as indicated; currently using walker for ambulation (did not use regularly prior to recent hospital admission) Fragile state of health, multiple progressing chronic health conditions (4) unplanned hospital admissions x last (6)/ (12) months  Initial Transition Care Management Follow-up Telephone Call Discharge Date and Diagnosis: 05/17/24, hypokalemia   Past Medical History:  Diagnosis Date   Alopecia    Anxiety    Aortic atherosclerosis    Asthma    FOLLOWED BY PCP   Eczema    GERD (gastroesophageal reflux disease)    Gout    04-28-2018--- per pt stable , as been a while since last episode   Heart murmur    History of colon polyps    History of syncope 2015   Hyperlipidemia    Hypertension    Hypothyroidism    OA (osteoarthritis) of knee    bilateral   PAT (paroxysmal atrial tachycardia)    Occurred in the setting of ileus and electrolyte abnormalities   PVC's (premature ventricular contractions)    Toxic multinodular goiter    03/ 2003  s/p RAI   Type 2 diabetes mellitus (HCC)    followed by pcp   Ventricular tachycardia, polymorphic (HCC) 01/04/2014   primary cardiologist-- dr wilbert turner (hx monitor 2015 showed couplet PVCs, as trigger)   Wears glasses    Wears partial dentures    upper   Assessment:  I am doing fine and feel okay today, my blood pressure this morning was 123/69.  No palpitations today.  The heart doctor called  this morning and said I need to go to Dr. Norleen this afternoon because my potassium level is still running so low.  I wish they would figure out why it is so low when I am taking the potassium pills twice a day.  The PT is still coming and that is going fine.  My blood sugars are fine.  Dr. Shlomo said the heart monitor should come to me in the mail in a couple of days for home monitoring.    Patient denies clinical concerns and sounds to be in no distress throughout [facility] 30-day program outreach call   Patient Reported Symptoms: Cognitive Cognitive Status: Normal speech and language skills, Insightful and able to interpret abstract concepts, Alert and oriented to person, place, and time Cognitive/Intellectual Conditions Management [RPT]: None reported or documented in medical history or problem list      Neurological Neurological Review of Symptoms: No symptoms reported    HEENT HEENT Symptoms Reported: No symptoms reported      Cardiovascular Cardiovascular Symptoms Reported: No symptoms reported Other Cardiovascular Symptoms: I have bee having some palpitations every now and then so Dr. Shlomo has ordered the heart monitor for at home; none today.  My blood pressure at home is okay today and I feel okay- but Dr. Shlomo called me this morning and said I have to go to Dr. Nicola office this afternoon because my potassium is still low, even though I am taking  potassium twice a day Does patient have uncontrolled Hypertension?: No Cardiovascular Management Strategies: Routine screening, Coping strategies, Medication therapy, Adequate rest  Respiratory Respiratory Symptoms Reported: No symptoms reported Other Respiratory Symptoms: Denies shortness of breath outside of baseline; sounds to be in no distress throughout [facility] call Respiratory Management Strategies: Asthma action plan, Routine screening  Endocrine Endocrine Symptoms Reported: No symptoms reported Is patient diabetic?: Yes Is patient  checking blood sugars at home?: Yes List most recent blood sugar readings, include date and time of day: Reports fasting blood sugar this morning of 94; confirms none lower than 90, and none higher than low 100's    Gastrointestinal Gastrointestinal Symptoms Reported: No symptoms reported      Genitourinary Genitourinary Symptoms Reported: No symptoms reported    Integumentary Integumentary Symptoms Reported: No symptoms reported    Musculoskeletal Musculoskelatal Symptoms Reviewed: Limited mobility, Weakness, Unsteady gait Additional Musculoskeletal Details: confirmed uses assistive devices on regular basis, at baseline -- walker; denies new/ recent falls; continues to work with home health PT Musculoskeletal Management Strategies: Coping strategies, Routine screening, Adequate rest, Activity   Fall risk Follow up: Falls prevention discussed  Psychosocial Psychosocial Symptoms Reported: No symptoms reported Behavioral Management Strategies: Support system, Coping strategies Major Change/Loss/Stressor/Fears (CP): Medical condition, self Quality of Family Relationships: supportive, involved, helpful   Vitals:   06/02/24 1130  BP: 123/69      Medications Reviewed Today     Reviewed by Isac Lincks M, RN (Registered Nurse) on 06/02/24 at 1125  Med List Status: <None>   Medication Order Taking? Sig Documenting Provider Last Dose Status Informant  albuterol  (VENTOLIN  HFA) 108 (90 Base) MCG/ACT inhaler [id number]  Inhale 2 puffs into the lungs every 4 (four) hours as needed for wheezing or shortness of breath. Jeneal Danita Macintosh, MD  Active Self, Pharmacy Records  allopurinol  (ZYLOPRIM ) 100 MG tablet [id number]  TAKE 1 TABLET BY MOUTH EVERY DAY Norleen Lynwood ORN, MD  Active Self, Pharmacy Records  atropine 1 % ophthalmic solution [id number]  Place 1 drop into the left eye daily. [provider]  Active Self, Pharmacy Records  Azelastine  HCl 137 MCG/SPRAY SOLN [id number]   PLACE 2 SPRAYS INTO BOTH NOSTRILS 2 (TWO) TIMES DAILY. USE IN EACH NOSTRIL AS DIRECTED  Patient taking differently: Place 2 sprays into the nose in the morning and at bedtime.   Norleen Lynwood ORN, MD  Active Self, Pharmacy Records  brimonidine  (ALPHAGAN ) 0.2 % ophthalmic solution [id number]  3 (three) times daily. 1 drop in the left eye three times daily  Patient taking differently: Place 1 drop into the left eye in the morning and at bedtime.   [provider]  Active Self, Pharmacy Records  cetirizine  (ZYRTEC ) 10 MG tablet [id number]  TAKE 1 TABLET BY MOUTH EVERY DAY Padgett, Danita Macintosh, MD  Active Self, Pharmacy Records  citalopram  (CELEXA ) 40 MG tablet [id number]  TAKE 1 TABLET BY MOUTH EVERY DAY Norleen Lynwood ORN, MD  Active Self, Pharmacy Records  clonazePAM  (KLONOPIN ) 0.5 MG tablet [id number]  Take 1 tablet (0.5 mg total) by mouth daily as needed (vertigo). Norleen Lynwood ORN, MD  Active Self, Pharmacy Records  colchicine  0.6 MG tablet [id number]  TAKE 0.5 TABLETS (0.3 MG TOTAL) BY MOUTH DAILY AS NEEDED (GOUT OR PSUEDOGOUT PAIN). Corey, Evan S, MD  Active Self, Pharmacy Records  diltiazem  (CARDIZEM  CD) 180 MG 24 hr capsule [id number] Yes Take 1 capsule (180 mg total) by mouth daily. Shlomo Wilbert SAUNDERS, MD  Active   famotidine  (PEPCID )  20 MG tablet [id number]  Take 1 tablet (20 mg total) by mouth 2 (two) times daily. Jeneal Danita Macintosh, MD  Active Self, Pharmacy Records           Med Note [place], DUROJAHYE' R   Tue May 12, 2024 10:24 PM)    Fluticasone -Umeclidin-Vilant (TRELEGY ELLIPTA ) 200-62.5-25 MCG/ACT AEPB [id number]  Inhale 1 puff into the lungs daily. Jeneal Danita Macintosh, MD  Active Self, Pharmacy Records  hydrALAZINE  (APRESOLINE ) 25 MG tablet [id number]  TAKE 1 TABLET BY MOUTH THREE TIMES A DAY  Patient not taking: Reported on 06/02/2024   Norleen Lynwood ORN, MD  Active Self, Pharmacy Records  JARDIANCE  25 MG TABS tablet [id number]  TAKE 1 TABLET BY MOUTH EVERY DAY BEFORE BREAKFAST   Patient not taking: Reported on 06/02/2024   Norleen Lynwood ORN, MD  Active Self, Pharmacy Records  latanoprost  (XALATAN ) 0.005 % ophthalmic solution [id number]  Place 1 drop into the left eye at bedtime. [provider]  Active Self, Pharmacy Records  levocetirizine (XYZAL ) 5 MG tablet [id number]  Take 1 tablet (5 mg total) by mouth daily. Jeneal Danita Macintosh, MD  Active Self, Pharmacy Records           Med Note [facility], DUROJAHYE' R   Tue May 12, 2024 10:25 PM)    loperamide (IMODIUM) 2 MG capsule [id number]  Take 1 capsule (2 mg total) by mouth as needed for diarrhea or loose stools. Sebastian Toribio GAILS, MD  Active   meclizine  (ANTIVERT ) 25 MG tablet [id number]  TAKE 1 TABLET BY MOUTH 3 TIMES A DAY AS NEEDED FOR DIZZINESS  Patient taking differently: Take 25 mg by mouth 3 (three) times daily as needed for dizziness.   Norleen Lynwood ORN, MD  Active Self, Pharmacy Records  metoprolol  succinate (TOPROL -XL) 100 MG 24 hr tablet [id number]  TAKE 1 TABLET BY MOUTH 2 (TWO) TIMES DAILY. TAKE WITH OR IMMEDIATELY FOLLOWING A MEAL.  Patient taking differently: Take 100 mg by mouth in the morning and at bedtime. 06/06/24: Reports during TOC call- dose was decreased 05/26/24 by nephrology provider due to low BP's post- recent hospital discharge: currently taking 100 mg every day per nephrology provider instruction: cardiology and PCP providers made aware of patient's ongoing reports of low BP readings at home   Shlomo Wilbert SAUNDERS, MD  Active Self, Pharmacy Records  omeprazole  (PRILOSEC) 40 MG capsule [id number]  Take 1 capsule (40 mg total) by mouth daily.  Patient not taking: Reported on 06/02/2024   Norleen Lynwood ORN, MD  Active Self, Pharmacy Records           Med Note [facility], DUROJAHYE' R   Tue May 12, 2024 10:26 PM)    ondansetron  (ZOFRAN -ODT) 4 MG disintegrating tablet [id number]  Take 1 tablet (4 mg total) by mouth every 8 (eight) hours as needed for nausea or vomiting.  Patient taking differently: Take  4 mg by mouth every 8 (eight) hours as needed for nausea or vomiting (dissolve orally).   Davia Nydia POUR, MD  Active Self, Pharmacy Records  potassium chloride  SA (KLOR-CON  M) 20 MEQ tablet [id number] Yes Take 1 tablet (20 mEq total) by mouth 2 (two) times daily. Geofm Glade PARAS, MD  Active   rosuvastatin  (CRESTOR ) 40 MG tablet [id number]  TAKE 1 TABLET BY MOUTH EVERY DAY Shlomo Wilbert SAUNDERS, MD  Active Self, Pharmacy Records  sodium bicarbonate  650 MG tablet [id number]  Take 2 tablets (1,300 mg total) by mouth 2 (two) times daily. Sebastian Toribio GAILS, MD  Active   telmisartan  (MICARDIS ) 80 MG tablet [id number]  Take 80 mg by mouth daily.  Patient not taking: Reported on 06/02/2024   [provider]  Active   traZODone  (DESYREL ) 100 MG tablet [id number]  TAKE 1 TABLET (100 MG TOTAL) BY MOUTH AT BEDTIME AS NEEDED. FOR SLEEP Norleen Lynwood ORN, MD  Active Self, Pharmacy Records  TYLENOL  500 MG tablet [id number]  Take 1,000 mg by mouth every 8 (eight) hours as needed for mild pain (pain score 1-3) (or headaches). [provider]  Active Self, Pharmacy Records           Recommendation:   Acute PCP follow-up as arranged by cardiology provider for 06/02/24 office visit to evaluate ongoing hypokalemia. Continue Current Plan of Care  Follow Up Plan:   Telephone follow-up in 1 week- as scheduled 06/11/24: sooner as-if indicated  Pls call/ message for questions,  Kasarah Sitts Mckinney Zendaya Groseclose, RN, BSN, CCRN Alumnus RN Care Manager  Transitions of Care  [facility] - [facility] [phone]: direct office

## 2024-06-02 NOTE — Assessment & Plan Note (Signed)
 With ? Recent worsening - also for f/u confirmatory bmp today, and f/u renal dec 11 as planned

## 2024-06-07 ENCOUNTER — Encounter (HOSPITAL_COMMUNITY): Payer: Self-pay

## 2024-06-07 ENCOUNTER — Other Ambulatory Visit: Payer: Self-pay

## 2024-06-07 ENCOUNTER — Emergency Department (HOSPITAL_COMMUNITY)

## 2024-06-07 ENCOUNTER — Inpatient Hospital Stay (HOSPITAL_COMMUNITY)
Admission: EM | Admit: 2024-06-07 | Discharge: 2024-06-21 | DRG: 330 | Disposition: A | Discharge status: Home / Self Care | Attending: Internal Medicine | Admitting: Internal Medicine

## 2024-06-07 DIAGNOSIS — K5730 Diverticulosis of large intestine without perforation or abscess without bleeding: Secondary | ICD-10-CM | POA: Diagnosis present

## 2024-06-07 DIAGNOSIS — D124 Benign neoplasm of descending colon: Secondary | ICD-10-CM | POA: Diagnosis not present

## 2024-06-07 DIAGNOSIS — K219 Gastro-esophageal reflux disease without esophagitis: Secondary | ICD-10-CM | POA: Diagnosis present

## 2024-06-07 DIAGNOSIS — K639 Disease of intestine, unspecified: Secondary | ICD-10-CM | POA: Diagnosis present

## 2024-06-07 DIAGNOSIS — N1832 Chronic kidney disease, stage 3b: Secondary | ICD-10-CM | POA: Diagnosis present

## 2024-06-07 DIAGNOSIS — E860 Dehydration: Secondary | ICD-10-CM | POA: Diagnosis present

## 2024-06-07 DIAGNOSIS — K644 Residual hemorrhoidal skin tags: Secondary | ICD-10-CM | POA: Diagnosis not present

## 2024-06-07 DIAGNOSIS — Z794 Long term (current) use of insulin: Secondary | ICD-10-CM | POA: Diagnosis not present

## 2024-06-07 DIAGNOSIS — K56699 Other intestinal obstruction unspecified as to partial versus complete obstruction: Secondary | ICD-10-CM | POA: Diagnosis present

## 2024-06-07 DIAGNOSIS — D62 Acute posthemorrhagic anemia: Secondary | ICD-10-CM | POA: Diagnosis not present

## 2024-06-07 DIAGNOSIS — Z860100 Personal history of colon polyps, unspecified: Secondary | ICD-10-CM

## 2024-06-07 DIAGNOSIS — E119 Type 2 diabetes mellitus without complications: Secondary | ICD-10-CM

## 2024-06-07 DIAGNOSIS — Z5331 Laparoscopic surgical procedure converted to open procedure: Secondary | ICD-10-CM

## 2024-06-07 DIAGNOSIS — K50012 Crohn's disease of small intestine with intestinal obstruction: Secondary | ICD-10-CM | POA: Diagnosis present

## 2024-06-07 DIAGNOSIS — D631 Anemia in chronic kidney disease: Secondary | ICD-10-CM | POA: Diagnosis present

## 2024-06-07 DIAGNOSIS — R109 Unspecified abdominal pain: Secondary | ICD-10-CM

## 2024-06-07 DIAGNOSIS — Z79899 Other long term (current) drug therapy: Secondary | ICD-10-CM

## 2024-06-07 DIAGNOSIS — K7689 Other specified diseases of liver: Secondary | ICD-10-CM | POA: Diagnosis present

## 2024-06-07 DIAGNOSIS — E1122 Type 2 diabetes mellitus with diabetic chronic kidney disease: Secondary | ICD-10-CM | POA: Diagnosis present

## 2024-06-07 DIAGNOSIS — J4489 Other specified chronic obstructive pulmonary disease: Secondary | ICD-10-CM | POA: Diagnosis present

## 2024-06-07 DIAGNOSIS — Z888 Allergy status to other drugs, medicaments and biological substances status: Secondary | ICD-10-CM

## 2024-06-07 DIAGNOSIS — Z6836 Body mass index (BMI) 36.0-36.9, adult: Secondary | ICD-10-CM

## 2024-06-07 DIAGNOSIS — R112 Nausea with vomiting, unspecified: Secondary | ICD-10-CM | POA: Diagnosis not present

## 2024-06-07 DIAGNOSIS — I10 Essential (primary) hypertension: Secondary | ICD-10-CM | POA: Diagnosis present

## 2024-06-07 DIAGNOSIS — J45909 Unspecified asthma, uncomplicated: Secondary | ICD-10-CM | POA: Diagnosis present

## 2024-06-07 DIAGNOSIS — K389 Disease of appendix, unspecified: Secondary | ICD-10-CM | POA: Diagnosis not present

## 2024-06-07 DIAGNOSIS — E876 Hypokalemia: Secondary | ICD-10-CM | POA: Diagnosis present

## 2024-06-07 DIAGNOSIS — F418 Other specified anxiety disorders: Secondary | ICD-10-CM | POA: Diagnosis not present

## 2024-06-07 DIAGNOSIS — E46 Unspecified protein-calorie malnutrition: Secondary | ICD-10-CM | POA: Diagnosis present

## 2024-06-07 DIAGNOSIS — I129 Hypertensive chronic kidney disease with stage 1 through stage 4 chronic kidney disease, or unspecified chronic kidney disease: Secondary | ICD-10-CM | POA: Diagnosis present

## 2024-06-07 DIAGNOSIS — Z8249 Family history of ischemic heart disease and other diseases of the circulatory system: Secondary | ICD-10-CM

## 2024-06-07 DIAGNOSIS — D649 Anemia, unspecified: Secondary | ICD-10-CM | POA: Diagnosis present

## 2024-06-07 DIAGNOSIS — Z885 Allergy status to narcotic agent status: Secondary | ICD-10-CM

## 2024-06-07 DIAGNOSIS — J449 Chronic obstructive pulmonary disease, unspecified: Secondary | ICD-10-CM | POA: Diagnosis not present

## 2024-06-07 DIAGNOSIS — F32A Depression, unspecified: Secondary | ICD-10-CM | POA: Diagnosis present

## 2024-06-07 DIAGNOSIS — E039 Hypothyroidism, unspecified: Secondary | ICD-10-CM | POA: Diagnosis present

## 2024-06-07 DIAGNOSIS — K660 Peritoneal adhesions (postprocedural) (postinfection): Secondary | ICD-10-CM | POA: Diagnosis present

## 2024-06-07 DIAGNOSIS — K56609 Unspecified intestinal obstruction, unspecified as to partial versus complete obstruction: Secondary | ICD-10-CM | POA: Diagnosis not present

## 2024-06-07 DIAGNOSIS — I493 Ventricular premature depolarization: Secondary | ICD-10-CM | POA: Diagnosis present

## 2024-06-07 DIAGNOSIS — Z886 Allergy status to analgesic agent status: Secondary | ICD-10-CM

## 2024-06-07 DIAGNOSIS — K529 Noninfective gastroenteritis and colitis, unspecified: Secondary | ICD-10-CM | POA: Diagnosis present

## 2024-06-07 DIAGNOSIS — C183 Malignant neoplasm of hepatic flexure: Secondary | ICD-10-CM | POA: Diagnosis not present

## 2024-06-07 DIAGNOSIS — C187 Malignant neoplasm of sigmoid colon: Secondary | ICD-10-CM | POA: Diagnosis present

## 2024-06-07 DIAGNOSIS — N179 Acute kidney failure, unspecified: Secondary | ICD-10-CM | POA: Diagnosis present

## 2024-06-07 DIAGNOSIS — M109 Gout, unspecified: Secondary | ICD-10-CM | POA: Diagnosis present

## 2024-06-07 DIAGNOSIS — Z961 Presence of intraocular lens: Secondary | ICD-10-CM | POA: Diagnosis present

## 2024-06-07 DIAGNOSIS — C182 Malignant neoplasm of ascending colon: Principal | ICD-10-CM | POA: Diagnosis present

## 2024-06-07 DIAGNOSIS — I700 Atherosclerosis of aorta: Secondary | ICD-10-CM | POA: Diagnosis present

## 2024-06-07 DIAGNOSIS — K6389 Other specified diseases of intestine: Secondary | ICD-10-CM | POA: Diagnosis present

## 2024-06-07 DIAGNOSIS — Z90710 Acquired absence of both cervix and uterus: Secondary | ICD-10-CM

## 2024-06-07 DIAGNOSIS — K37 Unspecified appendicitis: Secondary | ICD-10-CM

## 2024-06-07 DIAGNOSIS — F419 Anxiety disorder, unspecified: Secondary | ICD-10-CM | POA: Diagnosis present

## 2024-06-07 DIAGNOSIS — Z833 Family history of diabetes mellitus: Secondary | ICD-10-CM

## 2024-06-07 DIAGNOSIS — E785 Hyperlipidemia, unspecified: Secondary | ICD-10-CM | POA: Diagnosis present

## 2024-06-07 DIAGNOSIS — I4891 Unspecified atrial fibrillation: Secondary | ICD-10-CM | POA: Diagnosis not present

## 2024-06-07 DIAGNOSIS — E66811 Obesity, class 1: Secondary | ICD-10-CM | POA: Diagnosis present

## 2024-06-07 DIAGNOSIS — Z9842 Cataract extraction status, left eye: Secondary | ICD-10-CM

## 2024-06-07 DIAGNOSIS — Z96653 Presence of artificial knee joint, bilateral: Secondary | ICD-10-CM | POA: Diagnosis present

## 2024-06-07 LAB — COMPREHENSIVE METABOLIC PANEL WITH GFR
ALT: 12 U/L (ref 0–44)
AST: 27 U/L (ref 15–41)
Albumin: 2.9 g/dL — ABNORMAL LOW (ref 3.5–5.0)
Alkaline Phosphatase: 180 U/L — ABNORMAL HIGH (ref 38–126)
Anion gap: 10 (ref 5–15)
BUN: 13 mg/dL (ref 8–23)
CO2: 22 mmol/L (ref 22–32)
Calcium: 8.5 mg/dL — ABNORMAL LOW (ref 8.9–10.3)
Chloride: 109 mmol/L (ref 98–111)
Creatinine, Ser: 1.61 mg/dL — ABNORMAL HIGH (ref 0.44–1.00)
GFR, Estimated: 34 mL/min — ABNORMAL LOW (ref >60)
Glucose, Bld: 108 mg/dL — ABNORMAL HIGH (ref 70–99)
Potassium: 4.1 mmol/L (ref 3.5–5.1)
Sodium: 141 mmol/L (ref 135–145)
Total Bilirubin: 0.3 mg/dL (ref 0.0–1.2)
Total Protein: 5.7 g/dL — ABNORMAL LOW (ref 6.5–8.1)

## 2024-06-07 LAB — CBC WITH DIFFERENTIAL/PLATELET
Abs Immature Granulocytes: 0.04 K/uL (ref 0.00–0.07)
Basophils Absolute: 0 K/uL (ref 0.0–0.1)
Basophils Relative: 0 %
Eosinophils Absolute: 0 K/uL (ref 0.0–0.5)
Eosinophils Relative: 0 %
HCT: 32.2 % — ABNORMAL LOW (ref 36.0–46.0)
Hemoglobin: 10.3 g/dL — ABNORMAL LOW (ref 12.0–15.0)
Immature Granulocytes: 1 %
Lymphocytes Relative: 13 %
Lymphs Abs: 1.1 K/uL (ref 0.7–4.0)
MCH: 27 pg (ref 26.0–34.0)
MCHC: 32 g/dL (ref 30.0–36.0)
MCV: 84.3 fL (ref 80.0–100.0)
Monocytes Absolute: 0.3 K/uL (ref 0.1–1.0)
Monocytes Relative: 4 %
Neutro Abs: 7 K/uL (ref 1.7–7.7)
Neutrophils Relative %: 82 %
Platelets: 322 K/uL (ref 150–400)
RBC: 3.82 MIL/uL — ABNORMAL LOW (ref 3.87–5.11)
RDW: 17.9 % — ABNORMAL HIGH (ref 11.5–15.5)
WBC: 8.5 K/uL (ref 4.0–10.5)
nRBC: 0 % (ref 0.0–0.2)

## 2024-06-07 LAB — TYPE AND SCREEN
ABO/RH(D): O POS
Antibody Screen: NEGATIVE

## 2024-06-07 LAB — VITAMIN B12: Vitamin B-12: 1476 pg/mL — ABNORMAL HIGH (ref 180–914)

## 2024-06-07 LAB — MAGNESIUM: Magnesium: 1.6 mg/dL — ABNORMAL LOW (ref 1.7–2.4)

## 2024-06-07 LAB — PROCALCITONIN: Procalcitonin: 0.33 ng/mL

## 2024-06-07 LAB — IRON AND TIBC
Iron: 39 ug/dL (ref 28–170)
Saturation Ratios: 19 % (ref 10.4–31.8)
TIBC: 207 ug/dL — ABNORMAL LOW (ref 250–450)
UIBC: 168 ug/dL

## 2024-06-07 LAB — LACTIC ACID, PLASMA
Lactic Acid, Venous: 2 mmol/L (ref 0.5–1.9)
Lactic Acid, Venous: 1.3 mmol/L (ref 0.5–1.9)

## 2024-06-07 LAB — FERRITIN: Ferritin: 142 ng/mL (ref 11–307)

## 2024-06-07 LAB — LIPASE, BLOOD: Lipase: <10 U/L — ABNORMAL LOW (ref 11–51)

## 2024-06-07 MED ORDER — ALUM & MAG HYDROXIDE-SIMETH 200-200-20 MG/5ML PO SUSP
30.0000 mL | Freq: Once | ORAL | Status: AC
  Administered 2024-06-07: 30 mL via ORAL
  Filled 2024-06-07: qty 30

## 2024-06-07 MED ORDER — MORPHINE SULFATE (PF) 2 MG/ML IV SOLN
2.0000 mg | INTRAVENOUS | Status: DC | PRN
  Administered 2024-06-08 – 2024-06-11 (×7): 2 mg via INTRAVENOUS
  Filled 2024-06-07 (×7): qty 1

## 2024-06-07 MED ORDER — ALLOPURINOL 100 MG PO TABS
100.0000 mg | ORAL_TABLET | Freq: Every day | ORAL | Status: DC
  Administered 2024-06-09 – 2024-06-21 (×12): 100 mg via ORAL
  Filled 2024-06-07 (×13): qty 1

## 2024-06-07 MED ORDER — LIDOCAINE VISCOUS HCL 2 % MT SOLN
15.0000 mL | Freq: Once | OROMUCOSAL | Status: AC
  Administered 2024-06-07: 15 mL via ORAL
  Filled 2024-06-07: qty 15

## 2024-06-07 MED ORDER — SODIUM CHLORIDE 0.9 % IV SOLN: INTRAVENOUS | Status: AC

## 2024-06-07 MED ORDER — ATROPINE SULFATE 1 % OP SOLN
1.0000 [drp] | Freq: Every day | OPHTHALMIC | Status: DC
  Administered 2024-06-08 – 2024-06-21 (×14): 1 [drp] via OPHTHALMIC
  Filled 2024-06-07: qty 2

## 2024-06-07 MED ORDER — ERYTHROMYCIN 5 MG/GM OP OINT
TOPICAL_OINTMENT | Freq: Every day | OPHTHALMIC | Status: DC
  Administered 2024-06-18: 1 via OPHTHALMIC
  Filled 2024-06-07 (×2): qty 1

## 2024-06-07 MED ORDER — CITALOPRAM HYDROBROMIDE 20 MG PO TABS
40.0000 mg | ORAL_TABLET | Freq: Every day | ORAL | Status: DC
  Administered 2024-06-09 – 2024-06-21 (×13): 40 mg via ORAL
  Filled 2024-06-07 (×14): qty 2

## 2024-06-07 MED ORDER — LORATADINE 10 MG PO TABS
10.0000 mg | ORAL_TABLET | Freq: Every day | ORAL | Status: DC
  Administered 2024-06-09 – 2024-06-21 (×12): 10 mg via ORAL
  Filled 2024-06-07 (×13): qty 1

## 2024-06-07 MED ORDER — DILTIAZEM HCL ER COATED BEADS 180 MG PO CP24
180.0000 mg | ORAL_CAPSULE | Freq: Every day | ORAL | Status: DC
  Administered 2024-06-09 – 2024-06-21 (×13): 180 mg via ORAL
  Filled 2024-06-07 (×14): qty 1

## 2024-06-07 MED ORDER — MAGNESIUM SULFATE 2 GM/50ML IV SOLN
2.0000 g | Freq: Once | INTRAVENOUS | Status: AC
  Administered 2024-06-07: 2 g via INTRAVENOUS
  Filled 2024-06-07: qty 50

## 2024-06-07 MED ORDER — ONDANSETRON HCL 4 MG/2ML IJ SOLN
4.0000 mg | Freq: Four times a day (QID) | INTRAMUSCULAR | Status: DC | PRN
  Administered 2024-06-08 – 2024-06-09 (×4): 4 mg via INTRAVENOUS
  Filled 2024-06-07 (×5): qty 2

## 2024-06-07 MED ORDER — METOPROLOL SUCCINATE ER 100 MG PO TB24
100.0000 mg | ORAL_TABLET | Freq: Every day | ORAL | Status: DC
  Administered 2024-06-09 – 2024-06-21 (×13): 100 mg via ORAL
  Filled 2024-06-07 (×14): qty 1

## 2024-06-07 MED ORDER — ERYTHROMYCIN 5 MG/GM OP OINT
1.0000 | TOPICAL_OINTMENT | Freq: Every day | OPHTHALMIC | Status: DC
  Filled 2024-06-07: qty 1

## 2024-06-07 MED ORDER — ONDANSETRON HCL 4 MG PO TABS: 4.0000 mg | ORAL_TABLET | Freq: Four times a day (QID) | ORAL | Status: DC | PRN

## 2024-06-07 MED ORDER — TRAZODONE HCL 100 MG PO TABS
100.0000 mg | ORAL_TABLET | Freq: Every evening | ORAL | Status: DC | PRN
  Administered 2024-06-08 – 2024-06-20 (×4): 100 mg via ORAL
  Filled 2024-06-07 (×4): qty 1

## 2024-06-07 MED ORDER — BRIMONIDINE TARTRATE 0.2 % OP SOLN
1.0000 [drp] | Freq: Two times a day (BID) | OPHTHALMIC | Status: DC
  Administered 2024-06-07 – 2024-06-21 (×28): 1 [drp] via OPHTHALMIC
  Filled 2024-06-07: qty 5

## 2024-06-07 MED ORDER — ACETAMINOPHEN 650 MG RE SUPP: 650.0000 mg | Freq: Four times a day (QID) | RECTAL | Status: DC | PRN

## 2024-06-07 MED ORDER — ROSUVASTATIN CALCIUM 20 MG PO TABS
40.0000 mg | ORAL_TABLET | Freq: Every day | ORAL | Status: DC
  Administered 2024-06-09 – 2024-06-21 (×12): 40 mg via ORAL
  Filled 2024-06-07 (×13): qty 2

## 2024-06-07 MED ORDER — BUDESON-GLYCOPYRROL-FORMOTEROL 160-9-4.8 MCG/ACT IN AERO
2.0000 | INHALATION_SPRAY | Freq: Two times a day (BID) | RESPIRATORY_TRACT | Status: DC
  Administered 2024-06-08 – 2024-06-21 (×27): 2 via RESPIRATORY_TRACT
  Filled 2024-06-07: qty 5.9

## 2024-06-07 MED ORDER — LATANOPROST 0.005 % OP SOLN
1.0000 [drp] | Freq: Every day | OPHTHALMIC | Status: DC
  Administered 2024-06-07 – 2024-06-20 (×14): 1 [drp] via OPHTHALMIC
  Filled 2024-06-07: qty 2.5

## 2024-06-07 MED ORDER — METOPROLOL TARTRATE 5 MG/5ML IV SOLN
5.0000 mg | Freq: Four times a day (QID) | INTRAVENOUS | Status: DC | PRN
  Administered 2024-06-09: 5 mg via INTRAVENOUS
  Filled 2024-06-07: qty 5

## 2024-06-07 MED ORDER — ACETAMINOPHEN 325 MG PO TABS: 650.0000 mg | ORAL_TABLET | Freq: Four times a day (QID) | ORAL | Status: DC | PRN

## 2024-06-07 MED ORDER — IOHEXOL 300 MG/ML  SOLN
100.0000 mL | Freq: Once | INTRAMUSCULAR | Status: AC | PRN
  Administered 2024-06-07: 80 mL via INTRAVENOUS

## 2024-06-07 MED ORDER — SODIUM BICARBONATE 650 MG PO TABS
1300.0000 mg | ORAL_TABLET | Freq: Every day | ORAL | Status: DC
  Administered 2024-06-09 – 2024-06-21 (×12): 1300 mg via ORAL
  Filled 2024-06-07 (×13): qty 2

## 2024-06-07 MED ORDER — AZELASTINE HCL 0.1 % NA SOLN
2.0000 | Freq: Two times a day (BID) | NASAL | Status: DC
  Administered 2024-06-07 – 2024-06-21 (×28): 2 via NASAL
  Filled 2024-06-07: qty 30

## 2024-06-07 MED ORDER — PANTOPRAZOLE SODIUM 40 MG IV SOLR
40.0000 mg | INTRAVENOUS | Status: DC
  Administered 2024-06-07 – 2024-06-18 (×12): 40 mg via INTRAVENOUS
  Filled 2024-06-07 (×12): qty 10

## 2024-06-07 MED ORDER — ONDANSETRON HCL 4 MG/2ML IJ SOLN
4.0000 mg | Freq: Once | INTRAMUSCULAR | Status: AC
  Administered 2024-06-07: 4 mg via INTRAVENOUS
  Filled 2024-06-07: qty 2

## 2024-06-07 MED ORDER — LEVOCETIRIZINE DIHYDROCHLORIDE 5 MG PO TABS
5.0000 mg | ORAL_TABLET | Freq: Every day | ORAL | Status: DC
Start: 2024-06-08 — End: 2024-06-07

## 2024-06-07 NOTE — ED Notes (Signed)
 Pt actively vomiting. Will hold po meds until some resolution of that

## 2024-06-07 NOTE — ED Provider Notes (Signed)
 [place] EMERGENCY DEPARTMENT AT [facility] Provider Note   CSN: [id number] Arrival date & time: 06/07/24  1249     Patient presents with: Abdominal Pain   Brooke [name] is a 73 y.o. female.  With a history of type 2 diabetes hypertension CKD status post cholecystectomy and GERD who presents to the ED for abdominal pain.  Abdominal pain started last night persisted since the onset.  Localized over epigastric region.  Associated nausea and vomiting nonbloody nonbilious emesis.  No changes in bowel habits.  Last bowel movement was yesterday was normal.  Denies fevers chills chest pain shortness of breath.  Urinary symptoms.    Abdominal Pain      Prior to Admission medications   Medication Sig Start Date End Date Taking? Authorizing Provider  albuterol  (VENTOLIN  HFA) 108 (90 Base) MCG/ACT inhaler Inhale 2 puffs into the lungs every 4 (four) hours as needed for wheezing or shortness of breath. 03/05/24   Jeneal Danita Macintosh, MD  allopurinol  (ZYLOPRIM ) 100 MG tablet TAKE 1 TABLET BY MOUTH EVERY DAY 05/06/24   Norleen Lynwood ORN, MD  atropine 1 % ophthalmic solution Place 1 drop into the left eye daily. 01/16/24   [provider]  Azelastine  HCl 137 MCG/SPRAY SOLN PLACE 2 SPRAYS INTO BOTH NOSTRILS 2 (TWO) TIMES DAILY. USE IN EACH NOSTRIL AS DIRECTED Patient taking differently: Place 2 sprays into the nose in the morning and at bedtime. 04/03/24   Norleen Lynwood ORN, MD  brimonidine  (ALPHAGAN ) 0.2 % ophthalmic solution 3 (three) times daily. 1 drop in the left eye three times daily Patient taking differently: Place 1 drop into the left eye in the morning and at bedtime.    [provider]  cetirizine  (ZYRTEC ) 10 MG tablet TAKE 1 TABLET BY MOUTH EVERY DAY 05/05/24   Jeneal Danita Macintosh, MD  citalopram  (CELEXA ) 40 MG tablet TAKE 1 TABLET BY MOUTH EVERY DAY 03/06/24   Norleen Lynwood ORN, MD  clonazePAM  (KLONOPIN ) 0.5 MG tablet Take 1 tablet (0.5 mg total) by mouth  daily as needed (vertigo). 02/25/23   Norleen Lynwood ORN, MD  colchicine  0.6 MG tablet TAKE 0.5 TABLETS (0.3 MG TOTAL) BY MOUTH DAILY AS NEEDED (GOUT OR PSUEDOGOUT PAIN). 10/01/22   Corey, Evan S, MD  diltiazem  (CARDIZEM  CD) 180 MG 24 hr capsule Take 1 capsule (180 mg total) by mouth daily. 06/01/24 08/30/24  Shlomo Wilbert SAUNDERS, MD  famotidine  (PEPCID ) 20 MG tablet Take 1 tablet (20 mg total) by mouth 2 (two) times daily. 03/05/24   Jeneal Danita Macintosh, MD  Fluticasone -Umeclidin-Vilant (TRELEGY ELLIPTA ) 200-62.5-25 MCG/ACT AEPB Inhale 1 puff into the lungs daily. 03/05/24   Jeneal Danita Macintosh, MD  latanoprost  (XALATAN ) 0.005 % ophthalmic solution Place 1 drop into the left eye at bedtime.    [provider]  levocetirizine (XYZAL ) 5 MG tablet Take 1 tablet (5 mg total) by mouth daily. 03/05/24   Jeneal Danita Macintosh, MD  loperamide (IMODIUM) 2 MG capsule Take 1 capsule (2 mg total) by mouth as needed for diarrhea or loose stools. 05/17/24   Sebastian Toribio GAILS, MD  meclizine  (ANTIVERT ) 25 MG tablet TAKE 1 TABLET BY MOUTH 3 TIMES A DAY AS NEEDED FOR DIZZINESS Patient taking differently: Take 25 mg by mouth 3 (three) times daily as needed for dizziness. 11/04/23   Norleen Lynwood ORN, MD  metoprolol  succinate (TOPROL -XL) 100 MG 24 hr tablet TAKE 1 TABLET BY MOUTH 2 (TWO) TIMES DAILY. TAKE WITH OR IMMEDIATELY FOLLOWING A MEAL.  Patient taking differently: Take 100 mg by mouth in the morning and at bedtime. 06/06/24: Reports during TOC call- dose was decreased 05/26/24 by nephrology provider due to low BP's post- recent hospital discharge: currently taking 100 mg every day per nephrology provider instruction: cardiology and PCP providers made aware of patient's ongoing reports of low BP readings at home 04/03/24   Shlomo Wilbert SAUNDERS, MD  omeprazole  (PRILOSEC) 40 MG capsule Take 1 capsule (40 mg total) by mouth daily. Patient not taking: Reported on 06/02/2024 12/05/21   Norleen Lynwood ORN, MD  ondansetron   (ZOFRAN -ODT) 4 MG disintegrating tablet Take 1 tablet (4 mg total) by mouth every 8 (eight) hours as needed for nausea or vomiting. Patient taking differently: Take 4 mg by mouth every 8 (eight) hours as needed for nausea or vomiting (dissolve orally). 04/16/24   Rai, Ripudeep MARLA, MD  potassium chloride  (KLOR-CON  10) 10 MEQ tablet Take 1 tablet (10 mEq total) by mouth daily for 5 days. 06/02/24 06/07/24  Norleen Lynwood ORN, MD  potassium chloride  SA (KLOR-CON  M) 20 MEQ tablet Take 1 tablet (20 mEq total) by mouth 2 (two) times daily. 05/22/24   Geofm Glade PARAS, MD  rosuvastatin  (CRESTOR ) 40 MG tablet TAKE 1 TABLET BY MOUTH EVERY DAY 04/22/24   Shlomo Wilbert SAUNDERS, MD  sodium bicarbonate  650 MG tablet Take 2 tablets (1,300 mg total) by mouth 2 (two) times daily. 05/17/24   Sebastian Toribio GAILS, MD  telmisartan  (MICARDIS ) 80 MG tablet Take 80 mg by mouth daily. Patient not taking: Reported on 06/02/2024 05/30/24   [provider]  traZODone  (DESYREL ) 100 MG tablet TAKE 1 TABLET (100 MG TOTAL) BY MOUTH AT BEDTIME AS NEEDED. FOR SLEEP 03/03/24   Norleen Lynwood ORN, MD  TYLENOL  500 MG tablet Take 1,000 mg by mouth every 8 (eight) hours as needed for mild pain (pain score 1-3) (or headaches).    [provider]    Allergies: Gnp glp-1 daily support [germanium], Metformin  and related, Ace inhibitors, Codeine, Hydrocodone , Tramadol , and Tylenol  [acetaminophen ]    Review of Systems  Gastrointestinal:  Positive for abdominal pain.    Updated Vital Signs BP (!) 166/87   Pulse 80   Temp 98 F (36.7 C)   Resp 16   SpO2 100%   Physical Exam Vitals and nursing note reviewed.  HENT:     Head: Normocephalic and atraumatic.  Eyes:     Pupils: Pupils are equal, round, and reactive to light.  Cardiovascular:     Rate and Rhythm: Normal rate and regular rhythm.  Pulmonary:     Effort: Pulmonary effort is normal.     Breath sounds: Normal breath sounds.  Abdominal:     Palpations: Abdomen is soft.      Tenderness: There is abdominal tenderness in the epigastric area. There is no guarding or rebound.  Skin:    General: Skin is warm and dry.  Neurological:     Mental Status: She is alert.  Psychiatric:        Mood and Affect: Mood normal.     (all labs ordered are listed, but only abnormal results are displayed) Labs Reviewed  COMPREHENSIVE METABOLIC PANEL WITH GFR - Abnormal; Notable for the following components:      Result Value   Glucose, Bld 108 (*)    Creatinine, Ser 1.61 (*)    Calcium  8.5 (*)    Total Protein 5.7 (*)    Albumin 2.9 (*)    Alkaline Phosphatase 180 (*)  GFR, Estimated 34 (*)    All other components within normal limits  LIPASE, BLOOD - Abnormal; Notable for the following components:   Lipase <10 (*)    All other components within normal limits  CBC WITH DIFFERENTIAL/PLATELET - Abnormal; Notable for the following components:   RBC 3.82 (*)    Hemoglobin 10.3 (*)    HCT 32.2 (*)    RDW 17.9 (*)    All other components within normal limits  URINALYSIS, W/ REFLEX TO CULTURE (INFECTION SUSPECTED)    EKG: EKG Interpretation Date/Time:  Sunday June 07 2024 14:01:06 EST Ventricular Rate:  78 PR Interval:  173 QRS Duration:  121 QT Interval:  414 QTC Calculation: 472 R Axis:   -32  Text Interpretation: Sinus rhythm Atrial premature complex LVH with secondary repolarization abnormality Confirmed by Pamella Sharper [phone]) on 06/07/2024 4:00:37 PM  Radiology: CT ABDOMEN PELVIS W CONTRAST Result Date: 06/07/2024 CLINICAL DATA:  Possible bowel obstruction. Abdominal pain beginning yesterday. EXAM: CT ABDOMEN AND PELVIS WITH CONTRAST TECHNIQUE: Multidetector CT imaging of the abdomen and pelvis was performed using the standard protocol following bolus administration of intravenous contrast. RADIATION DOSE REDUCTION: This exam was performed according to the departmental dose-optimization program which includes automated exposure control, adjustment of the  mA and/or kV according to patient size and/or use of iterative reconstruction technique. CONTRAST:  80mL OMNIPAQUE IOHEXOL 300 MG/ML  SOLN COMPARISON:  CT 04/28/2024, MRI abdomen 01/07/2022 FINDINGS: Lower chest: Heart is normal size. Minimal calcified plaque over the descending thoracic aorta. Visualized lung bases are normal for a tiny amount of right pleural fluid. Hepatobiliary: Previous cholecystectomy. There are several liver cysts unchanged. Biliary tree is normal. Pancreas: Fatty atrophy of the pancreas which is otherwise normal. Spleen: Normal. Adrenals/Urinary Tract: Adrenal glands are normal. Kidneys are normal in size. No focal mass or hydronephrosis. No nephrolithiasis. 2.8 cm hypodense mass over the upper pole left kidney unchanged compatible with slightly hyperdense cyst. Ureters and bladder are normal. Stomach/Bowel: Possible small sliding hiatal hernia unchanged. Stomach is otherwise unremarkable. Small bowel is notable for wall thickening of a moderate segment of the distal ileum. There is focal circumferential narrowing with slight associated increased soft tissue density/enhancement involving the ascending colon in the region of the hepatic flexure. This is concerning for a focal mass/neoplasm. Just proximal to this narrowing, the cecum and ascending colon is fluid-filled and mildly dilated measuring 7.6 cm in diameter. Appendix is enlarged measuring 1.3 cm distally near the tip and demonstrates mucosal enhancement. There is a small amount of free fluid in the right lower quadrant as findings are concerning for early acute appendicitis. Vascular/Lymphatic: Mild calcified plaque over the abdominal aorta which is normal in caliber. No adenopathy. Reproductive: Status post hysterectomy. No adnexal masses. Other: Mild free fluid over the perisplenic region and minimally over the right pericolic gutter in the right lower quadrant and in the pelvis. Musculoskeletal: No focal abnormality. IMPRESSION: 1.  Enlarged appendix measuring 1.3 cm in diameter with mucosal enhancement and mild adjacent free fluid in the right lower quadrant. Findings are concerning for mild acute appendicitis. 2. Focal circumferential narrowing with slight associated increased soft tissue density/enhancement over the ascending colon in the region of the hepatic flexure. This is concerning for a focal mass/neoplasm. Consider colonoscopy for further evaluation. 3. Wall thickening of a moderate segment of the distal ileum. Findings may be due to small bowel enteritis of infectious or inflammatory nature. 4. Mild free peritoneal fluid. 5. Several liver cysts unchanged. 6. 2.8  cm hypodense mass over the upper pole left kidney unchanged compatible with slightly hyperdense cyst. 7. Aortic atherosclerosis. Aortic Atherosclerosis (ICD10-I70.0). These results were called by telephone at the time of interpretation on 06/07/2024 at 5:05 pm to provider [facility] , who verbally acknowledged these results. Electronically Signed   By: Toribio Agreste M.D.   On: 06/07/2024 17:05     Procedures   Medications Ordered in the ED  alum & mag hydroxide-simeth (MAALOX/MYLANTA) 200-200-20 MG/5ML suspension 30 mL (has no administration in time range)    And  lidocaine  (XYLOCAINE ) 2 % viscous mouth solution 15 mL (has no administration in time range)  iohexol (OMNIPAQUE) 300 MG/ML solution 100 mL (80 mLs Intravenous Contrast Given 06/07/24 1611)  ondansetron  (ZOFRAN ) injection 4 mg (4 mg Intravenous Given 06/07/24 1747)    Clinical Course as of 06/07/24 1750  Sun Jun 07, 2024  1702 Call taken from radiology.  CT abdomen pelvis concerning for early appendicitis. ?colonic mass at hepatic flexure. Terminal ileum inflammatory changes  [MP]  1712 Discussed with Dr. Debby (general surgery).  Low suspicion for true appendicitis at this timeconsidering physical exam findings, lack of leukocytosis and  with multiple areas inflammatory changes.  Recommend  admission to medicine for further evaluation.  Surgery will consult.  Paged hospitalist [MP]    Clinical Course User Index [MP] Pamella Ozell LABOR, DO                                 Medical Decision Making 73 year old female with history as above presenting to the ED for abdominal pain nausea vomiting beginning last night.  Afebrile hypertensive.  Blood glucose well-controlled at home.  Epigastric abdominal tenderness on exam.    Differential diagnosis includes: Bowel obstruction Gastroparesis in setting of type 2 diabetes Acute intra-abdominal infectious/inflammatory process such as appendicitis, diverticulitis, pancreatitis and cholecystitis Urinary tract infection Atypical presentation for pneumonia Viral gastroenteritis  Will obtain laboratory workup including CBC with differential, metabolic panel, lipase and urinalysis along with CT abdomen pelvis  Amount and/or Complexity of Data Reviewed Labs: ordered. Radiology: ordered.  Risk OTC drugs. Prescription drug management. Decision regarding hospitalization.        Final diagnoses:  Submucosal colonic lesion  Abdominal pain, unspecified abdominal location  Nausea and vomiting, unspecified vomiting type  Appendicitis, unspecified appendicitis type    ED Discharge Orders     None          Pamella Ozell LABOR, DO 06/07/24 1750

## 2024-06-07 NOTE — ED Notes (Signed)
 Writer consulting with Ultrasound team due to difficult IV start.

## 2024-06-07 NOTE — Assessment & Plan Note (Signed)
 Continue celexa

## 2024-06-07 NOTE — Assessment & Plan Note (Signed)
 73 year old presenting to ED with 2-3 day history of acute epigastric/upper abdominal pain with nausea and vomiting found to have abnormal appendix imaging as well as focal circumferential narrowing with slight associated increased soft tissue density/enhancement over the ascending colon in the region of the hepatic flexure concerning for focal mass/neoplasm. She also has wall thickening of a moderate segment of the distal ileum.  -admit to progressive  -general surgery consulted, no clinical indication of appendicitis, see #2 -denies any change in BM. Last BM this AM and normal  -last colonoscopy in 2021, diverticulosis of sigmoid colon otherwise normal. NO family history of colon cancer  -check CEA -NPO except for ice chips/meds -PPI IV -IVF -pain meds  -GI will need consulted in AM when [place] back on for colonoscopy

## 2024-06-07 NOTE — Assessment & Plan Note (Signed)
 On allopurinol , med rec pending  No s/sx of flare

## 2024-06-07 NOTE — ED Notes (Signed)
 Writer told pt a urine sample was needed. Pt unable to urinate at this time.

## 2024-06-07 NOTE — Assessment & Plan Note (Addendum)
 Baseline hgb 8-9 Check iron studies/B12  Type and screen  Check fecal occult  Trend

## 2024-06-07 NOTE — Assessment & Plan Note (Signed)
 Med rec not done and HTN Appears to be on metoprolol  and cardizem  PRN metoprolol  IV for now

## 2024-06-07 NOTE — Assessment & Plan Note (Signed)
 A1C of 5.4  Continue lifestyle modifications

## 2024-06-07 NOTE — H&P (Signed)
 History and Physical    Patient: Brooke [name] FMW:[id number] DOB: 07/09/1951 DOA: 06/07/2024 DOS: the patient was seen and examined on 06/07/2024 PCP: Norleen Lynwood ORN, MD  Patient coming from: Home - lives with son and daughter in law. Uses walker to ambulate.    Chief Complaint: abdominal pain, nausea and vomiting   HPI: Brooke [name] is a 73 y.o. female with medical history significant of HTN, HLD, GERD, gout, asthma, anxiety, hypothyroidism, T2DM, CKD stage 3b who presented to ED with complaints of abdominal pain and nausea and vomiting that started over past 2-3 days. Pain is in epigastric area and rated as a 6/10. Pain is sharp and constant. It does not radiate. Nothing seems to make it better. Food seems to make it worse. She has no diarrhea. No fever/chills. She had a normal BM this AM. She denies any blood or dark stool. She is not passing gas.   Last colonoscopy in 12/2019 with diverticulosis of sigmoid colon, otherwise normal. No family history of colon cancer.   Denies any fever/chills, vision changes/headaches, chest pain or palpitations, shortness of breath or cough, diarrhea, dysuria. She has swelling in her legs that started about 2 weeks ago and is new for her.   She does not smoke or drink alcohol.   ER Course:  vitals: afebrile, bp: 166/87, HR: 80, RR: 16, oxygen : 100%RA Pertinent labs: hgb: 10.3, creatinine: 1.61,  CT abdomen/pelvis: Enlarged appendix measuring 1.3 cm in diameter with mucosal enhancement and mild adjacent free fluid in the right lower quadrant. Findings are concerning for mild acute appendicitis. 2. Focal circumferential narrowing with slight associated increased soft tissue density/enhancement over the ascending colon in the region of the hepatic flexure. This is concerning for a focal mass/neoplasm. Consider colonoscopy for further evaluation. 3. Wall thickening of a moderate segment of the distal ileum. Findings may be due to small bowel enteritis  of infectious or inflammatory nature. 4. Mild free peritoneal fluid. 5. Several liver cysts unchanged. 6. 2.8 cm hypodense mass over the upper pole left kidney unchanged compatible with slightly hyperdense cyst. 7. Aortic atherosclerosis. In ED: given zofran . General surgery consulted and TRH asked to admit.     Review of Systems: As mentioned in the history of present illness. All other systems reviewed and are negative. Past Medical History:  Diagnosis Date   Alopecia    Anxiety    Aortic atherosclerosis    Asthma    FOLLOWED BY PCP   Eczema    GERD (gastroesophageal reflux disease)    Gout    04-28-2018--- per pt stable , as been a while since last episode   Heart murmur    History of colon polyps    History of syncope 2015   Hyperlipidemia    Hypertension    Hypothyroidism    OA (osteoarthritis) of knee    bilateral   PAT (paroxysmal atrial tachycardia)    Occurred in the setting of ileus and electrolyte abnormalities   PVC's (premature ventricular contractions)    Toxic multinodular goiter    03/ 2003  s/p RAI   Type 2 diabetes mellitus (HCC)    followed by pcp   Ventricular tachycardia, polymorphic (HCC) 01/04/2014   primary cardiologist-- dr wilbert turner (hx monitor 2015 showed couplet PVCs, as trigger)   Wears glasses    Wears partial dentures    upper   Past Surgical History:  Procedure Laterality Date   ABDOMINAL HYSTERECTOMY  10/22/1999   WITH BSO  CATARACT EXTRACTION W/ INTRAOCULAR LENS IMPLANT Left YRS AGO   COLONOSCOPY     EXCISION ABDOMINAL WALL MASS  12-20-2005   dr merrilyn @[facility]    neruofibroma   LAPAROSCOPIC CHOLECYSTECTOMY  10/01/2002   dr merrilyn @[facility]    LEFT HEART CATHETERIZATION WITH CORONARY ANGIOGRAM N/A 01/07/2014   Procedure: LEFT HEART CATHETERIZATION WITH CORONARY ANGIOGRAM;  Surgeon: Peter M Jordan, MD;  Location: [facility] CATH LAB;  Service: Cardiovascular;  Laterality: N/A;   RASTELLI PROCEDURE  6/98 neg   RENAL ARTERY STENT Left 11/2003    angioplasty and stenting   RENAL ARTERY STENT Left 02/2005   re-stenting   TOTAL KNEE ARTHROPLASTY Right 05/05/2018   Procedure: RIGHT TOTAL KNEE ARTHROPLASTY;  Surgeon: Liam Lerner, MD;  Location: [facility];  Service: Orthopedics;  Laterality: Right;   TOTAL KNEE ARTHROPLASTY Left 09/30/2018   Procedure: TOTAL KNEE ARTHROPLASTY;  Surgeon: Sheril Coy, MD;  Location: [facility];  Service: Orthopedics;  Laterality: Left;   Social History:  reports that she has never smoked. She has never been exposed to tobacco smoke. She has never used smokeless tobacco. She reports that she does not drink alcohol and does not use drugs.  Allergies  Allergen Reactions   Gnp Glp-1 Daily Support [Germanium] Other (See Comments)    Ileus requiring hospitalization   Metformin  And Related Nausea And Vomiting   Ace Inhibitors Swelling and Other (See Comments)    Ankles swell   Codeine Nausea And Vomiting and Rash   Hydrocodone  Nausea And Vomiting   Tramadol  Nausea And Vomiting   Tylenol  [Acetaminophen ] Itching, Rash and Other (See Comments)    Allergic to prescription strength Tylenol . The patient is taking 500 mg Tylenol  in 2025, however.    Family History  Problem Relation Age of Onset   Diabetes Mother    Heart disease Father    Colon cancer Neg Hx    Esophageal cancer Neg Hx    Rectal cancer Neg Hx    Stomach cancer Neg Hx    BRCA 1/2 Neg Hx    Breast cancer Neg Hx     Prior to Admission medications   Medication Sig Start Date End Date Taking? Authorizing Provider  albuterol  (VENTOLIN  HFA) 108 (90 Base) MCG/ACT inhaler Inhale 2 puffs into the lungs every 4 (four) hours as needed for wheezing or shortness of breath. 03/05/24   Jeneal Danita Macintosh, MD  allopurinol  (ZYLOPRIM ) 100 MG tablet TAKE 1 TABLET BY MOUTH EVERY DAY 05/06/24   Norleen Lynwood ORN, MD  atropine 1 % ophthalmic solution Place 1 drop into the left eye daily. 01/16/24   [provider]  Azelastine  HCl 137 MCG/SPRAY SOLN  PLACE 2 SPRAYS INTO BOTH NOSTRILS 2 (TWO) TIMES DAILY. USE IN EACH NOSTRIL AS DIRECTED Patient taking differently: Place 2 sprays into the nose in the morning and at bedtime. 04/03/24   Norleen Lynwood ORN, MD  brimonidine  (ALPHAGAN ) 0.2 % ophthalmic solution 3 (three) times daily. 1 drop in the left eye three times daily Patient taking differently: Place 1 drop into the left eye in the morning and at bedtime.    [provider]  cetirizine  (ZYRTEC ) 10 MG tablet TAKE 1 TABLET BY MOUTH EVERY DAY 05/05/24   Jeneal Danita Macintosh, MD  citalopram  (CELEXA ) 40 MG tablet TAKE 1 TABLET BY MOUTH EVERY DAY 03/06/24   Norleen Lynwood ORN, MD  clonazePAM  (KLONOPIN ) 0.5 MG tablet Take 1 tablet (0.5 mg total) by mouth daily as needed (vertigo). 02/25/23   Norleen Lynwood ORN,  MD  colchicine  0.6 MG tablet TAKE 0.5 TABLETS (0.3 MG TOTAL) BY MOUTH DAILY AS NEEDED (GOUT OR PSUEDOGOUT PAIN). 10/01/22   Corey, Evan S, MD  diltiazem  (CARDIZEM  CD) 180 MG 24 hr capsule Take 1 capsule (180 mg total) by mouth daily. 06/01/24 08/30/24  Shlomo Wilbert SAUNDERS, MD  famotidine  (PEPCID ) 20 MG tablet Take 1 tablet (20 mg total) by mouth 2 (two) times daily. 03/05/24   Jeneal Danita Macintosh, MD  Fluticasone -Umeclidin-Vilant (TRELEGY ELLIPTA ) 200-62.5-25 MCG/ACT AEPB Inhale 1 puff into the lungs daily. 03/05/24   Jeneal Danita Macintosh, MD  latanoprost  (XALATAN ) 0.005 % ophthalmic solution Place 1 drop into the left eye at bedtime.    [provider]  levocetirizine (XYZAL ) 5 MG tablet Take 1 tablet (5 mg total) by mouth daily. 03/05/24   Jeneal Danita Macintosh, MD  loperamide (IMODIUM) 2 MG capsule Take 1 capsule (2 mg total) by mouth as needed for diarrhea or loose stools. 05/17/24   Sebastian Toribio GAILS, MD  meclizine  (ANTIVERT ) 25 MG tablet TAKE 1 TABLET BY MOUTH 3 TIMES A DAY AS NEEDED FOR DIZZINESS Patient taking differently: Take 25 mg by mouth 3 (three) times daily as needed for dizziness. 11/04/23   Norleen Lynwood ORN, MD  metoprolol   succinate (TOPROL -XL) 100 MG 24 hr tablet TAKE 1 TABLET BY MOUTH 2 (TWO) TIMES DAILY. TAKE WITH OR IMMEDIATELY FOLLOWING A MEAL. Patient taking differently: Take 100 mg by mouth in the morning and at bedtime. 06/06/24: Reports during TOC call- dose was decreased 05/26/24 by nephrology provider due to low BP's post- recent hospital discharge: currently taking 100 mg every day per nephrology provider instruction: cardiology and PCP providers made aware of patient's ongoing reports of low BP readings at home 04/03/24   Shlomo Wilbert SAUNDERS, MD  omeprazole  (PRILOSEC) 40 MG capsule Take 1 capsule (40 mg total) by mouth daily. Patient not taking: Reported on 06/02/2024 12/05/21   Norleen Lynwood ORN, MD  ondansetron  (ZOFRAN -ODT) 4 MG disintegrating tablet Take 1 tablet (4 mg total) by mouth every 8 (eight) hours as needed for nausea or vomiting. Patient taking differently: Take 4 mg by mouth every 8 (eight) hours as needed for nausea or vomiting (dissolve orally). 04/16/24   Rai, Nydia POUR, MD  potassium chloride  (KLOR-CON  10) 10 MEQ tablet Take 1 tablet (10 mEq total) by mouth daily for 5 days. 06/02/24 06/07/24  Norleen Lynwood ORN, MD  potassium chloride  SA (KLOR-CON  M) 20 MEQ tablet Take 1 tablet (20 mEq total) by mouth 2 (two) times daily. 05/22/24   Geofm Glade PARAS, MD  rosuvastatin  (CRESTOR ) 40 MG tablet TAKE 1 TABLET BY MOUTH EVERY DAY 04/22/24   Shlomo Wilbert SAUNDERS, MD  sodium bicarbonate  650 MG tablet Take 2 tablets (1,300 mg total) by mouth 2 (two) times daily. 05/17/24   Sebastian Toribio GAILS, MD  telmisartan  (MICARDIS ) 80 MG tablet Take 80 mg by mouth daily. Patient not taking: Reported on 06/02/2024 05/30/24   [provider]  traZODone  (DESYREL ) 100 MG tablet TAKE 1 TABLET (100 MG TOTAL) BY MOUTH AT BEDTIME AS NEEDED. FOR SLEEP 03/03/24   Norleen Lynwood ORN, MD  TYLENOL  500 MG tablet Take 1,000 mg by mouth every 8 (eight) hours as needed for mild pain (pain score 1-3) (or headaches).    [provider]     Physical Exam: Vitals:   06/07/24 1734 06/07/24 1830 06/07/24 1855 06/07/24 1901  BP: (!) 166/87 (!) 174/92  (!) 182/89  Pulse: 80   83  Resp: 16 (!) 21  19  Temp: 98 F (36.7 C)   98 F (36.7 C)  TempSrc:    Oral  SpO2: 100%   100%  Weight:   80.5 kg   Height:   5' 3 (1.6 m)    General:  Appears calm and comfortable and is in NAD Eyes:  PERRL, EOMI, normal lids, iris ENT:  HOH, lips & tongue, mmm; appropriate dentition Neck:  no LAD, masses or thyromegaly; no carotid bruits Cardiovascular:  RRR, no m/r/g. 1+ pitting edema in BLE  Respiratory:   CTA bilaterally with no wheezes/rales/rhonchi.  Normal respiratory effort. Abdomen:  soft, significant TTP in epigastric and RUQ/LUQ. No TTP in lower quadrants. BS+. No rebound or guarding  Back:   normal alignment, no CVAT Skin:  no rash or induration seen on limited exam Musculoskeletal:  grossly normal tone BUE/BLE, good ROM, no bony abnormality Lower extremity:  No LE edema.  Limited foot exam with no ulcerations.  2+ distal pulses. Psychiatric:  grossly normal mood and affect, speech fluent and appropriate, AOx3 Neurologic:  CN 2-12 grossly intact, moves all extremities in coordinated fashion, sensation intact   Radiological Exams on Admission: Independently reviewed - see discussion in A/P where applicable  CT ABDOMEN PELVIS W CONTRAST Result Date: 06/07/2024 CLINICAL DATA:  Possible bowel obstruction. Abdominal pain beginning yesterday. EXAM: CT ABDOMEN AND PELVIS WITH CONTRAST TECHNIQUE: Multidetector CT imaging of the abdomen and pelvis was performed using the standard protocol following bolus administration of intravenous contrast. RADIATION DOSE REDUCTION: This exam was performed according to the departmental dose-optimization program which includes automated exposure control, adjustment of the mA and/or kV according to patient size and/or use of iterative reconstruction technique. CONTRAST:  80mL OMNIPAQUE IOHEXOL 300  MG/ML  SOLN COMPARISON:  CT 04/28/2024, MRI abdomen 01/07/2022 FINDINGS: Lower chest: Heart is normal size. Minimal calcified plaque over the descending thoracic aorta. Visualized lung bases are normal for a tiny amount of right pleural fluid. Hepatobiliary: Previous cholecystectomy. There are several liver cysts unchanged. Biliary tree is normal. Pancreas: Fatty atrophy of the pancreas which is otherwise normal. Spleen: Normal. Adrenals/Urinary Tract: Adrenal glands are normal. Kidneys are normal in size. No focal mass or hydronephrosis. No nephrolithiasis. 2.8 cm hypodense mass over the upper pole left kidney unchanged compatible with slightly hyperdense cyst. Ureters and bladder are normal. Stomach/Bowel: Possible small sliding hiatal hernia unchanged. Stomach is otherwise unremarkable. Small bowel is notable for wall thickening of a moderate segment of the distal ileum. There is focal circumferential narrowing with slight associated increased soft tissue density/enhancement involving the ascending colon in the region of the hepatic flexure. This is concerning for a focal mass/neoplasm. Just proximal to this narrowing, the cecum and ascending colon is fluid-filled and mildly dilated measuring 7.6 cm in diameter. Appendix is enlarged measuring 1.3 cm distally near the tip and demonstrates mucosal enhancement. There is a small amount of free fluid in the right lower quadrant as findings are concerning for early acute appendicitis. Vascular/Lymphatic: Mild calcified plaque over the abdominal aorta which is normal in caliber. No adenopathy. Reproductive: Status post hysterectomy. No adnexal masses. Other: Mild free fluid over the perisplenic region and minimally over the right pericolic gutter in the right lower quadrant and in the pelvis. Musculoskeletal: No focal abnormality. IMPRESSION: 1. Enlarged appendix measuring 1.3 cm in diameter with mucosal enhancement and mild adjacent free fluid in the right lower  quadrant. Findings are concerning for mild acute appendicitis. 2. Focal circumferential narrowing with slight  associated increased soft tissue density/enhancement over the ascending colon in the region of the hepatic flexure. This is concerning for a focal mass/neoplasm. Consider colonoscopy for further evaluation. 3. Wall thickening of a moderate segment of the distal ileum. Findings may be due to small bowel enteritis of infectious or inflammatory nature. 4. Mild free peritoneal fluid. 5. Several liver cysts unchanged. 6. 2.8 cm hypodense mass over the upper pole left kidney unchanged compatible with slightly hyperdense cyst. 7. Aortic atherosclerosis. Aortic Atherosclerosis (ICD10-I70.0). These results were called by telephone at the time of interpretation on 06/07/2024 at 5:05 pm to provider [facility] , who verbally acknowledged these results. Electronically Signed   By: Toribio Agreste M.D.   On: 06/07/2024 17:05    EKG: Independently reviewed.  NSR with rate 78; nonspecific ST changes with no evidence of acute ischemia   Labs on Admission: I have personally reviewed the available labs and imaging studies at the time of the admission.  Pertinent labs:   hgb: 10.3,  creatinine: 1.61,   Assessment and Plan: Principal Problem:   abdominal pain with nausea and vomiting and findings concerning for focal mass in ascedning colon Active Problems:   Appendix disease   DM2 (diabetes mellitus, type 2) (HCC)   Hyperlipidemia   Gout   Normochromic normocytic anemia   Essential hypertension   Asthma   GERD (gastroesophageal reflux disease)   Depression with anxiety   CKD stage 3b, GFR 30-44 ml/min (HCC)    Assessment and Plan: * abdominal pain with nausea and vomiting and findings concerning for focal mass in ascedning colon 73 year old presenting to ED with 2-3 day history of acute epigastric/upper abdominal pain with nausea and vomiting found to have abnormal appendix imaging as well as  focal circumferential narrowing with slight associated increased soft tissue density/enhancement over the ascending colon in the region of the hepatic flexure concerning for focal mass/neoplasm. She also has wall thickening of a moderate segment of the distal ileum.  -admit to progressive  -general surgery consulted, no clinical indication of appendicitis, see #2 -denies any change in BM. Last BM this AM and normal  -last colonoscopy in 2021, diverticulosis of sigmoid colon otherwise normal. NO family history of colon cancer  -check CEA -NPO except for ice chips/meds -PPI IV -IVF -pain meds  -GI will need consulted in AM when [place] back on for colonoscopy   Appendix disease Ct findings of enlarged appendix with mucosal enhancement and mild adjacent free fluid in RLQ concerning for mild acute appendicitis Clincally she has no pain in lower quadrants, no fever and no leukocytosis  General surgery consulted, no clinical signs concerning for appendicitis Likely from possible mass in hepatic flexure  Continue to monitor clinically   CKD stage 3b, GFR 30-44 ml/min (HCC) Baseline creatinine 1.5-1.9 Stable Continue to monitor   Depression with anxiety Continue celexa    GERD (gastroesophageal reflux disease) PPI IV   Asthma Continue trelegy  No s/sx of exacerbation   Essential hypertension Med rec not done and HTN Appears to be on metoprolol  and cardizem  PRN metoprolol  IV for now   Normochromic normocytic anemia Baseline hgb 8-9 Check iron studies/B12  Type and screen  Check fecal occult  Trend   Gout On allopurinol , med rec pending  No s/sx of flare  Hyperlipidemia Continue statin, med rec pending  DM2 (diabetes mellitus, type 2) (HCC) A1C of 5.4  Continue lifestyle modifications        Advance Care Planning:   Code  Status: Full Code   Consultants: General surgery: Dr. Debby    Procedures: CT abdomen/pelvis 06/07/24    Antibiotics: None     DVT  Prophylaxis: SCDs   Family Communication: updated son over the phone.   Severity of Illness: The appropriate patient status for this patient is INPATIENT. Inpatient status is judged to be reasonable and necessary in order to provide the required intensity of service to ensure the patient's safety. The patient's presenting symptoms, physical exam findings, and initial radiographic and laboratory data in the context of their chronic comorbidities is felt to place them at high risk for further clinical deterioration. Furthermore, it is not anticipated that the patient will be medically stable for discharge from the hospital within 2 midnights of admission.   * I certify that at the point of admission it is my clinical judgment that the patient will require inpatient hospital care spanning beyond 2 midnights from the point of admission due to high intensity of service, high risk for further deterioration and high frequency of surveillance required.*  Author: Isaiah Geralds, MD 06/07/2024 8:18 PM  For on call review [email or web address].

## 2024-06-07 NOTE — Assessment & Plan Note (Signed)
 Baseline creatinine 1.5-1.9 Stable Continue to monitor

## 2024-06-07 NOTE — Assessment & Plan Note (Signed)
 Continue statin, med rec pending

## 2024-06-07 NOTE — ED Triage Notes (Signed)
 PTAR reports pt coming from home c/o abdominal pain that started yesterday with n/v no diarrhea. Pt has not been able to eat since yesterday.

## 2024-06-07 NOTE — Assessment & Plan Note (Addendum)
 PPI IV

## 2024-06-07 NOTE — Assessment & Plan Note (Signed)
 Continue trelegy  No s/sx of exacerbation

## 2024-06-07 NOTE — Consult Note (Signed)
 CC: abd pain  Requesting provider: Dr Pamella  HPI: Brooke [name] is an 73 y.o. female who is here for abd pain.  She states that she has has noticed this for the last few months.  It began to worsen recently and has been associated with nausea and vomiting.  She presented to the ED and CT shows hepatic flexure stricture, terminal ileitis and a liver mass.  WBC 8.5 with no left shift.     Past Medical History:  Diagnosis Date   Alopecia    Anxiety    Aortic atherosclerosis    Asthma    FOLLOWED BY PCP   Eczema    GERD (gastroesophageal reflux disease)    Gout    04-28-2018--- per pt stable , as been a while since last episode   Heart murmur    History of colon polyps    History of syncope 2015   Hyperlipidemia    Hypertension    Hypothyroidism    OA (osteoarthritis) of knee    bilateral   PAT (paroxysmal atrial tachycardia)    Occurred in the setting of ileus and electrolyte abnormalities   PVC's (premature ventricular contractions)    Toxic multinodular goiter    03/ 2003  s/p RAI   Type 2 diabetes mellitus (HCC)    followed by pcp   Ventricular tachycardia, polymorphic (HCC) 01/04/2014   primary cardiologist-- dr wilbert turner (hx monitor 2015 showed couplet PVCs, as trigger)   Wears glasses    Wears partial dentures    upper    Past Surgical History:  Procedure Laterality Date   ABDOMINAL HYSTERECTOMY  10/22/1999   WITH BSO   CATARACT EXTRACTION W/ INTRAOCULAR LENS IMPLANT Left YRS AGO   COLONOSCOPY     EXCISION ABDOMINAL WALL MASS  12-20-2005   dr merrilyn @[facility]    neruofibroma   LAPAROSCOPIC CHOLECYSTECTOMY  10/01/2002   dr merrilyn @[facility]    LEFT HEART CATHETERIZATION WITH CORONARY ANGIOGRAM N/A 01/07/2014   Procedure: LEFT HEART CATHETERIZATION WITH CORONARY ANGIOGRAM;  Surgeon: Peter M Jordan, MD;  Location: [facility] CATH LAB;  Service: Cardiovascular;  Laterality: N/A;   RASTELLI PROCEDURE  6/98 neg   RENAL ARTERY STENT Left 11/2003   angioplasty and stenting    RENAL ARTERY STENT Left 02/2005   re-stenting   TOTAL KNEE ARTHROPLASTY Right 05/05/2018   Procedure: RIGHT TOTAL KNEE ARTHROPLASTY;  Surgeon: Liam Lerner, MD;  Location: [facility];  Service: Orthopedics;  Laterality: Right;   TOTAL KNEE ARTHROPLASTY Left 09/30/2018   Procedure: TOTAL KNEE ARTHROPLASTY;  Surgeon: Sheril Coy, MD;  Location: [facility];  Service: Orthopedics;  Laterality: Left;    Family History  Problem Relation Age of Onset   Diabetes Mother    Heart disease Father    Colon cancer Neg Hx    Esophageal cancer Neg Hx    Rectal cancer Neg Hx    Stomach cancer Neg Hx    BRCA 1/2 Neg Hx    Breast cancer Neg Hx     Social:  reports that she has never smoked. She has never been exposed to tobacco smoke. She has never used smokeless tobacco. She reports that she does not drink alcohol and does not use drugs.  Allergies:  Allergies  Allergen Reactions   Gnp Glp-1 Daily Support [Germanium] Other (See Comments)    Ileus requiring hospitalization   Metformin  And Related Nausea And Vomiting   Ace Inhibitors Swelling and Other (See Comments)    Ankles swell  Codeine Nausea And Vomiting and Rash   Hydrocodone  Nausea And Vomiting   Tramadol  Nausea And Vomiting   Tylenol  [Acetaminophen ] Itching, Rash and Other (See Comments)    Allergic to prescription strength Tylenol . The patient is taking 500 mg Tylenol  in 2025, however.    Medications: I have reviewed the patient's current medications.  Results for orders placed or performed during the hospital encounter of 06/07/24 (from the past 48 hours)  Comprehensive metabolic panel     Status: Abnormal   Collection Time: 06/07/24  1:50 PM  Result Value Ref Range   Sodium 141 135 - 145 mmol/L   Potassium 4.1 3.5 - 5.1 mmol/L   Chloride 109 98 - 111 mmol/L   CO2 22 22 - 32 mmol/L   Glucose, Bld 108 (H) 70 - 99 mg/dL    Comment: Glucose reference range applies only to samples taken after fasting for at least 8 hours.   BUN 13  8 - 23 mg/dL   Creatinine, Ser 8.38 (H) 0.44 - 1.00 mg/dL   Calcium  8.5 (L) 8.9 - 10.3 mg/dL   Total Protein 5.7 (L) 6.5 - 8.1 g/dL   Albumin 2.9 (L) 3.5 - 5.0 g/dL   AST 27 15 - 41 U/L   ALT 12 0 - 44 U/L   Alkaline Phosphatase 180 (H) 38 - 126 U/L   Total Bilirubin 0.3 0.0 - 1.2 mg/dL   GFR, Estimated 34 (L) >60 mL/min    Comment: (NOTE) Calculated using the CKD-EPI Creatinine Equation (2021)    Anion gap 10 5 - 15    Comment: Performed at [facility], [address] [place], [place] [address]  Lipase, blood     Status: Abnormal   Collection Time: 06/07/24  1:50 PM  Result Value Ref Range   Lipase <10 (L) 11 - 51 U/L    Comment: Performed at [facility], [address], [place], [place] [address]  CBC with Differential     Status: Abnormal   Collection Time: 06/07/24  1:50 PM  Result Value Ref Range   WBC 8.5 4.0 - 10.5 K/uL   RBC 3.82 (L) 3.87 - 5.11 MIL/uL   Hemoglobin 10.3 (L) 12.0 - 15.0 g/dL   HCT 67.7 (L) 63.9 - 53.9 %   MCV 84.3 80.0 - 100.0 fL   MCH 27.0 26.0 - 34.0 pg   MCHC 32.0 30.0 - 36.0 g/dL   RDW 82.0 (H) 88.4 - 84.4 %   Platelets 322 150 - 400 K/uL   nRBC 0.0 0.0 - 0.2 %   Neutrophils Relative % 82 %   Neutro Abs 7.0 1.7 - 7.7 K/uL   Lymphocytes Relative 13 %   Lymphs Abs 1.1 0.7 - 4.0 K/uL   Monocytes Relative 4 %   Monocytes Absolute 0.3 0.1 - 1.0 K/uL   Eosinophils Relative 0 %   Eosinophils Absolute 0.0 0.0 - 0.5 K/uL   Basophils Relative 0 %   Basophils Absolute 0.0 0.0 - 0.1 K/uL   Immature Granulocytes 1 %   Abs Immature Granulocytes 0.04 0.00 - 0.07 K/uL    Comment: Performed at [facility], [address] [place], [place] [address]    CT ABDOMEN PELVIS W CONTRAST Result Date: 06/07/2024 CLINICAL DATA:  Possible bowel obstruction. Abdominal pain beginning yesterday. EXAM: CT ABDOMEN AND PELVIS WITH CONTRAST TECHNIQUE: Multidetector CT imaging of the abdomen and pelvis was  performed using the standard protocol following bolus administration of intravenous  contrast. RADIATION DOSE REDUCTION: This exam was performed according to the departmental dose-optimization program which includes automated exposure control, adjustment of the mA and/or kV according to patient size and/or use of iterative reconstruction technique. CONTRAST:  80mL OMNIPAQUE IOHEXOL 300 MG/ML  SOLN COMPARISON:  CT 04/28/2024, MRI abdomen 01/07/2022 FINDINGS: Lower chest: Heart is normal size. Minimal calcified plaque over the descending thoracic aorta. Visualized lung bases are normal for a tiny amount of right pleural fluid. Hepatobiliary: Previous cholecystectomy. There are several liver cysts unchanged. Biliary tree is normal. Pancreas: Fatty atrophy of the pancreas which is otherwise normal. Spleen: Normal. Adrenals/Urinary Tract: Adrenal glands are normal. Kidneys are normal in size. No focal mass or hydronephrosis. No nephrolithiasis. 2.8 cm hypodense mass over the upper pole left kidney unchanged compatible with slightly hyperdense cyst. Ureters and bladder are normal. Stomach/Bowel: Possible small sliding hiatal hernia unchanged. Stomach is otherwise unremarkable. Small bowel is notable for wall thickening of a moderate segment of the distal ileum. There is focal circumferential narrowing with slight associated increased soft tissue density/enhancement involving the ascending colon in the region of the hepatic flexure. This is concerning for a focal mass/neoplasm. Just proximal to this narrowing, the cecum and ascending colon is fluid-filled and mildly dilated measuring 7.6 cm in diameter. Appendix is enlarged measuring 1.3 cm distally near the tip and demonstrates mucosal enhancement. There is a small amount of free fluid in the right lower quadrant as findings are concerning for early acute appendicitis. Vascular/Lymphatic: Mild calcified plaque over the abdominal aorta which is normal in caliber. No  adenopathy. Reproductive: Status post hysterectomy. No adnexal masses. Other: Mild free fluid over the perisplenic region and minimally over the right pericolic gutter in the right lower quadrant and in the pelvis. Musculoskeletal: No focal abnormality. IMPRESSION: 1. Enlarged appendix measuring 1.3 cm in diameter with mucosal enhancement and mild adjacent free fluid in the right lower quadrant. Findings are concerning for mild acute appendicitis. 2. Focal circumferential narrowing with slight associated increased soft tissue density/enhancement over the ascending colon in the region of the hepatic flexure. This is concerning for a focal mass/neoplasm. Consider colonoscopy for further evaluation. 3. Wall thickening of a moderate segment of the distal ileum. Findings may be due to small bowel enteritis of infectious or inflammatory nature. 4. Mild free peritoneal fluid. 5. Several liver cysts unchanged. 6. 2.8 cm hypodense mass over the upper pole left kidney unchanged compatible with slightly hyperdense cyst. 7. Aortic atherosclerosis. Aortic Atherosclerosis (ICD10-I70.0). These results were called by telephone at the time of interpretation on 06/07/2024 at 5:05 pm to provider [facility] , who verbally acknowledged these results. Electronically Signed   By: Toribio Agreste M.D.   On: 06/07/2024 17:05    ROS - all of the below systems have been reviewed with the patient and positives are indicated with bold text General: chills, fever or night sweats Eyes: blurry vision or double vision ENT: epistaxis or sore throat Hematologic/Lymphatic: bleeding problems, blood clots or swollen lymph nodes Endocrine: temperature intolerance or unexpected weight changes Resp: cough, shortness of breath, or wheezing CV: chest pain or dyspnea on exertion GI: as per HPI GU: dysuria, trouble voiding, or hematuria Neuro: TIA or stroke symptoms    PE Blood pressure (!) 167/83, pulse 78, temperature 98 F (36.7 C),  resp. rate 12, SpO2 100%. Constitutional: NAD; conversant; no deformities Eyes: Moist conjunctiva; no lid lag; anicteric; PERRL Neck: Trachea midline; no thyromegaly Lungs: Normal respiratory effort CV: RRR GI: Abd TTP in LUQ  MSK: Normal range of motion of extremities; no clubbing/cyanosis Psychiatric: Appropriate affect; alert and oriented x3  Results for orders placed or performed during the hospital encounter of 06/07/24 (from the past 48 hours)  Comprehensive metabolic panel     Status: Abnormal   Collection Time: 06/07/24  1:50 PM  Result Value Ref Range   Sodium 141 135 - 145 mmol/L   Potassium 4.1 3.5 - 5.1 mmol/L   Chloride 109 98 - 111 mmol/L   CO2 22 22 - 32 mmol/L   Glucose, Bld 108 (H) 70 - 99 mg/dL    Comment: Glucose reference range applies only to samples taken after fasting for at least 8 hours.   BUN 13 8 - 23 mg/dL   Creatinine, Ser 8.38 (H) 0.44 - 1.00 mg/dL   Calcium  8.5 (L) 8.9 - 10.3 mg/dL   Total Protein 5.7 (L) 6.5 - 8.1 g/dL   Albumin 2.9 (L) 3.5 - 5.0 g/dL   AST 27 15 - 41 U/L   ALT 12 0 - 44 U/L   Alkaline Phosphatase 180 (H) 38 - 126 U/L   Total Bilirubin 0.3 0.0 - 1.2 mg/dL   GFR, Estimated 34 (L) >60 mL/min    Comment: (NOTE) Calculated using the CKD-EPI Creatinine Equation (2021)    Anion gap 10 5 - 15    Comment: Performed at [facility], [address], [place], [place] [address]  Lipase, blood     Status: Abnormal   Collection Time: 06/07/24  1:50 PM  Result Value Ref Range   Lipase <10 (L) 11 - 51 U/L    Comment: Performed at [facility], [address], [place], [place] [address]  CBC with Differential     Status: Abnormal   Collection Time: 06/07/24  1:50 PM  Result Value Ref Range   WBC 8.5 4.0 - 10.5 K/uL   RBC 3.82 (L) 3.87 - 5.11 MIL/uL   Hemoglobin 10.3 (L) 12.0 - 15.0 g/dL   HCT 67.7 (L) 63.9 - 53.9 %   MCV 84.3 80.0 - 100.0 fL   MCH 27.0 26.0 - 34.0 pg   MCHC 32.0 30.0 - 36.0 g/dL    RDW 82.0 (H) 88.4 - 15.5 %   Platelets 322 150 - 400 K/uL   nRBC 0.0 0.0 - 0.2 %   Neutrophils Relative % 82 %   Neutro Abs 7.0 1.7 - 7.7 K/uL   Lymphocytes Relative 13 %   Lymphs Abs 1.1 0.7 - 4.0 K/uL   Monocytes Relative 4 %   Monocytes Absolute 0.3 0.1 - 1.0 K/uL   Eosinophils Relative 0 %   Eosinophils Absolute 0.0 0.0 - 0.5 K/uL   Basophils Relative 0 %   Basophils Absolute 0.0 0.0 - 0.1 K/uL   Immature Granulocytes 1 %   Abs Immature Granulocytes 0.04 0.00 - 0.07 K/uL    Comment: Performed at [facility], [address], [place], [place] [address]    CT ABDOMEN PELVIS W CONTRAST Result Date: 06/07/2024 CLINICAL DATA:  Possible bowel obstruction. Abdominal pain beginning yesterday. EXAM: CT ABDOMEN AND PELVIS WITH CONTRAST TECHNIQUE: Multidetector CT imaging of the abdomen and pelvis was performed using the standard protocol following bolus administration of intravenous contrast. RADIATION DOSE REDUCTION: This exam was performed according to the departmental dose-optimization program which includes automated exposure control, adjustment of the mA and/or kV according to patient size and/or use of iterative reconstruction technique. CONTRAST:  80mL OMNIPAQUE IOHEXOL 300 MG/ML  SOLN COMPARISON:  CT 04/28/2024, MRI abdomen 01/07/2022 FINDINGS: Lower chest: Heart is normal size. Minimal calcified plaque over the descending thoracic aorta. Visualized lung bases are normal for a tiny amount of right pleural fluid. Hepatobiliary: Previous cholecystectomy. There are several liver cysts unchanged. Biliary tree is normal. Pancreas: Fatty atrophy of the pancreas which is otherwise normal. Spleen: Normal. Adrenals/Urinary Tract: Adrenal glands are normal. Kidneys are normal in size. No focal mass or hydronephrosis. No nephrolithiasis. 2.8 cm hypodense mass over the upper pole left kidney unchanged compatible with slightly hyperdense cyst. Ureters and bladder are normal.  Stomach/Bowel: Possible small sliding hiatal hernia unchanged. Stomach is otherwise unremarkable. Small bowel is notable for wall thickening of a moderate segment of the distal ileum. There is focal circumferential narrowing with slight associated increased soft tissue density/enhancement involving the ascending colon in the region of the hepatic flexure. This is concerning for a focal mass/neoplasm. Just proximal to this narrowing, the cecum and ascending colon is fluid-filled and mildly dilated measuring 7.6 cm in diameter. Appendix is enlarged measuring 1.3 cm distally near the tip and demonstrates mucosal enhancement. There is a small amount of free fluid in the right lower quadrant as findings are concerning for early acute appendicitis. Vascular/Lymphatic: Mild calcified plaque over the abdominal aorta which is normal in caliber. No adenopathy. Reproductive: Status post hysterectomy. No adnexal masses. Other: Mild free fluid over the perisplenic region and minimally over the right pericolic gutter in the right lower quadrant and in the pelvis. Musculoskeletal: No focal abnormality. IMPRESSION: 1. Enlarged appendix measuring 1.3 cm in diameter with mucosal enhancement and mild adjacent free fluid in the right lower quadrant. Findings are concerning for mild acute appendicitis. 2. Focal circumferential narrowing with slight associated increased soft tissue density/enhancement over the ascending colon in the region of the hepatic flexure. This is concerning for a focal mass/neoplasm. Consider colonoscopy for further evaluation. 3. Wall thickening of a moderate segment of the distal ileum. Findings may be due to small bowel enteritis of infectious or inflammatory nature. 4. Mild free peritoneal fluid. 5. Several liver cysts unchanged. 6. 2.8 cm hypodense mass over the upper pole left kidney unchanged compatible with slightly hyperdense cyst. 7. Aortic atherosclerosis. Aortic Atherosclerosis (ICD10-I70.0). These  results were called by telephone at the time of interpretation on 06/07/2024 at 5:05 pm to provider [facility] , who verbally acknowledged these results. Electronically Signed   By: Toribio Agreste M.D.   On: 06/07/2024 17:05     A/P: PRISEIS CRATTY is an 73 y.o. female with a hepatic flexure stricture causing dilation of cecum and appendix.  I do not see any clinical signs concerning for appendicitis.  She does have a significant terminal ileitis as well.  Would recommend NPO and GI consult.  Will most likely require surgical resection during this admission.   Brooke Mcglothen C Alexandre Lightsey, MD  Colorectal and General Surgery [facility] Surgery  This consult required high medical decision making.

## 2024-06-07 NOTE — Assessment & Plan Note (Signed)
 Ct findings of enlarged appendix with mucosal enhancement and mild adjacent free fluid in RLQ concerning for mild acute appendicitis Clincally she has no pain in lower quadrants, no fever and no leukocytosis  General surgery consulted, no clinical signs concerning for appendicitis Likely from possible mass in hepatic flexure  Continue to monitor clinically

## 2024-06-08 ENCOUNTER — Other Ambulatory Visit: Payer: Self-pay

## 2024-06-08 DIAGNOSIS — K56699 Other intestinal obstruction unspecified as to partial versus complete obstruction: Secondary | ICD-10-CM | POA: Diagnosis not present

## 2024-06-08 DIAGNOSIS — K6389 Other specified diseases of intestine: Secondary | ICD-10-CM | POA: Diagnosis not present

## 2024-06-08 LAB — CBC
HCT: 32.8 % — ABNORMAL LOW (ref 36.0–46.0)
Hemoglobin: 10.3 g/dL — ABNORMAL LOW (ref 12.0–15.0)
MCH: 27.1 pg (ref 26.0–34.0)
MCHC: 31.4 g/dL (ref 30.0–36.0)
MCV: 86.3 fL (ref 80.0–100.0)
Platelets: 320 K/uL (ref 150–400)
RBC: 3.8 MIL/uL — ABNORMAL LOW (ref 3.87–5.11)
RDW: 18.3 % — ABNORMAL HIGH (ref 11.5–15.5)
WBC: 10.4 K/uL (ref 4.0–10.5)
nRBC: 0 % (ref 0.0–0.2)

## 2024-06-08 LAB — URINALYSIS, W/ REFLEX TO CULTURE (INFECTION SUSPECTED)
Bilirubin Urine: NEGATIVE
Glucose, UA: 150 mg/dL — AB
Ketones, ur: NEGATIVE mg/dL
Leukocytes,Ua: NEGATIVE
Nitrite: NEGATIVE
Protein, ur: 100 mg/dL — AB
Specific Gravity, Urine: 1.025 (ref 1.005–1.030)
pH: 6 (ref 5.0–8.0)

## 2024-06-08 LAB — COMPREHENSIVE METABOLIC PANEL WITH GFR
ALT: 13 U/L (ref 0–44)
AST: 21 U/L (ref 15–41)
Albumin: 2.7 g/dL — ABNORMAL LOW (ref 3.5–5.0)
Alkaline Phosphatase: 176 U/L — ABNORMAL HIGH (ref 38–126)
Anion gap: 13 (ref 5–15)
BUN: 12 mg/dL (ref 8–23)
CO2: 19 mmol/L — ABNORMAL LOW (ref 22–32)
Calcium: 8.5 mg/dL — ABNORMAL LOW (ref 8.9–10.3)
Chloride: 111 mmol/L (ref 98–111)
Creatinine, Ser: 1.49 mg/dL — ABNORMAL HIGH (ref 0.44–1.00)
GFR, Estimated: 37 mL/min — ABNORMAL LOW (ref >60)
Glucose, Bld: 104 mg/dL — ABNORMAL HIGH (ref 70–99)
Potassium: 3.7 mmol/L (ref 3.5–5.1)
Sodium: 144 mmol/L (ref 135–145)
Total Bilirubin: 0.3 mg/dL (ref 0.0–1.2)
Total Protein: 5.8 g/dL — ABNORMAL LOW (ref 6.5–8.1)

## 2024-06-08 LAB — MAGNESIUM: Magnesium: 2.4 mg/dL (ref 1.7–2.4)

## 2024-06-08 LAB — PROCALCITONIN: Procalcitonin: 0.35 ng/mL

## 2024-06-08 LAB — PROTIME-INR
INR: 1.2 (ref 0.8–1.2)
Prothrombin Time: 15.4 s — ABNORMAL HIGH (ref 11.4–15.2)

## 2024-06-08 MED ORDER — ALUM & MAG HYDROXIDE-SIMETH 200-200-20 MG/5ML PO SUSP
30.0000 mL | ORAL | Status: DC | PRN
  Administered 2024-06-08: 30 mL via ORAL
  Filled 2024-06-08: qty 30

## 2024-06-08 NOTE — Progress Notes (Signed)
 2 PROGRESS NOTE    Brooke [name]  FMW:[id number] DOB: 1951-01-25 DOA: 06/07/2024 PCP: Norleen Lynwood ORN, MD   Brief Narrative:  Brooke [name] is a 73 y.o. female with medical history significant of HTN, HLD, GERD, gout, asthma, anxiety, hypothyroidism, T2DM, CKD stage 3b who presented to ED with complaints of abdominal pain and nausea and vomiting that started a few days prior to admission.  At intake patient had notable CT abdomen pelvis with ascending colonic mass concerning for obstructive pathology, general surgery and GI consulted for assistance, hospitalist called for admission.  Assessment & Plan:   Principal Problem:   abdominal pain with nausea and vomiting and findings concerning for focal mass in ascedning colon Active Problems:   Appendix disease   DM2 (diabetes mellitus, type 2) (HCC)   Hyperlipidemia   Gout   Normochromic normocytic anemia   Essential hypertension   Asthma   GERD (gastroesophageal reflux disease)   Depression with anxiety   CKD stage 3b, GFR 30-44 ml/min (HCC)  Acute intractable abdominal pain with nausea and vomiting Rule out obstruction Ascending colonic mass, unspecified  -General Surgery and GI following; appreciate insight recommendations - Bowel prep ordered, consideration for endoscopy versus exploratory laparoscopy pending per their discussion - Possible gastritis, likely viral given negative procalcitonin, normal leukocytosis and no fevers - Patient remains n.p.o. at this time, if prolonged ileus or postoperative complications are noted would likely place PICC line in preparation for TPN - Notably last bowel movement 11/16 am without complication   Acute appendicitis ruled out -Atypical findings on CT, general surgery consulted as above  - Clinically without signs or symptoms of acute appendicitis -possibly secondary to above mass and obstructive pathology  CKD stage 3b, GFR 30-44 ml/min (HCC) At baseline baseline (1.5-1.9) Depression with  anxiety Continue celexa   GERD (gastroesophageal reflux disease) PPI IV  Asthma Continue trelegy; No s/sx of exacerbation   Essential hypertension - Continue metoprolol /diltiazem . *Of note patient reports being on BID metoprolol  succ* - will need to confirm with pharmacy - continue once daily for now - Hydralazine  PRN SBP >160   Chronic normochromic normocytic anemia likely of chronic disease Potentially underlying occult GI bleed in the setting of mass Baseline hgb 8-9 -currently elevated likely secondary to hypovolemia and hemoconcentration B12 elevated Iron panel generally unremarkable other than elevated TIBC FOBT pending   Gout without flare, not currently on suppressants   Hyperlipidemia Continue statin  DM2 (diabetes mellitus, type 2) (HCC) A1C of 5.4 ;  Continue lifestyle modifications; no indication for hypoglycemic protocol or sliding scale insulin  at this time  DVT prophylaxis: SCDs Start: 06/07/24 2000 Place TED hose Start: 06/07/24 2000 Code Status:   Code Status: Full Code Family Communication: At bedside  Status is: Inpatient  Dispo: The patient is from: Home              Anticipated d/c is to: Home              Anticipated d/c date is: 72+ hours              Patient currently not medically stable for discharge  Consultants:  General Surgery, GI  Procedures:  Pending above, possible colonoscopy versus exploratory laparoscopy  Antimicrobials:  Perioperatively, none currently indicated  Subjective: No acute issues or events overnight, reports ongoing nausea but no vomiting otherwise denies constipation diarrhea headache fever chills chest pain shortness of breath  Objective: Vitals:   06/07/24 1901 06/07/24 2311 06/08/24 0255 06/08/24 0716  BP: (!) 182/89 (!) 176/85 (!) 176/89 (!) 177/86  Pulse: 83 81 82 85  Resp: 19 17 17 20   Temp: 98 F (36.7 C) 98.4 F (36.9 C)  98.4 F (36.9 C)  TempSrc: Oral Oral  Oral  SpO2: 100% 100% 99% 100%  Weight:       Height:        Intake/Output Summary (Last 24 hours) at 06/08/2024 0756 Last data filed at 06/08/2024 0615 Gross per 24 hour  Intake 780.6 ml  Output 300 ml  Net 480.6 ml   Filed Weights   06/07/24 1855  Weight: 80.5 kg    Examination:  General exam: Appears calm and comfortable  Respiratory system: Clear to auscultation. Respiratory effort normal. Cardiovascular system: S1 & S2 heard, RRR. No JVD, murmurs, rubs, gallops or clicks. No pedal edema. Gastrointestinal system: Abdomen is nondistended, soft and nontender. No organomegaly or masses felt. Normal bowel sounds heard. Central nervous system: Alert and oriented. No focal neurological deficits. Extremities: Symmetric 5 x 5 power. Skin: No rashes, lesions or ulcers Psychiatry: Judgement and insight appear normal. Mood & affect appropriate.     Data Reviewed: I have personally reviewed following labs and imaging studies  CBC: Recent Labs  Lab 06/02/24 1611 06/07/24 1350 06/08/24 0404  WBC 7.7 8.5 10.4  NEUTROABS 4.9 7.0  --   HGB 9.7* 10.3* 10.3*  HCT 30.0* 32.2* 32.8*  MCV 83.4 84.3 86.3  PLT 281.0 322 320   Basic Metabolic Panel: Recent Labs  Lab 06/01/24 0958 06/02/24 1611 06/07/24 1350 06/07/24 1842 06/08/24 0404  NA 143 140 141  --  144  K 2.9* 3.1* 4.1  --  3.7  CL 106 106 109  --  111  CO2 21 26 22   --  19*  GLUCOSE 81 98 108*  --  104*  BUN 16 17 13   --  12  CREATININE 2.11* 2.13* 1.61*  --  1.49*  CALCIUM  8.3* 8.1* 8.5*  --  8.5*  MG  --   --   --  1.6* 2.4   GFR: Estimated Creatinine Clearance: 34.3 mL/min (A) (by C-G formula based on SCr of 1.49 mg/dL (H)). Liver Function Tests: Recent Labs  Lab 06/01/24 0958 06/07/24 1350 06/08/24 0404  AST 17 27 21   ALT 16 12 13   ALKPHOS 220* 180* 176*  BILITOT 0.2 0.3 0.3  PROT 4.8* 5.7* 5.8*  ALBUMIN 2.5* 2.9* 2.7*   Recent Labs  Lab 06/07/24 1350  LIPASE <10*   Coagulation Profile: Recent Labs  Lab 06/08/24 0404  INR 1.2    Anemia Panel: Recent Labs    06/07/24 1916  VITAMINB12 1,476*  FERRITIN 142  TIBC 207*  IRON 39   Sepsis Labs: Recent Labs  Lab 06/07/24 1841 06/07/24 1916 06/07/24 2100 06/08/24 0404  PROCALCITON 0.33  --   --  0.35  LATICACIDVEN  --  2.0* 1.3  --     No results found for this or any previous visit (from the past 240 hours).   Radiology Studies: CT ABDOMEN PELVIS W CONTRAST Result Date: 06/07/2024 CLINICAL DATA:  Possible bowel obstruction. Abdominal pain beginning yesterday. EXAM: CT ABDOMEN AND PELVIS WITH CONTRAST TECHNIQUE: Multidetector CT imaging of the abdomen and pelvis was performed using the standard protocol following bolus administration of intravenous contrast. RADIATION DOSE REDUCTION: This exam was performed according to the departmental dose-optimization program which includes automated exposure control, adjustment of the mA and/or kV according to patient size and/or use of iterative  reconstruction technique. CONTRAST:  80mL OMNIPAQUE IOHEXOL 300 MG/ML  SOLN COMPARISON:  CT 04/28/2024, MRI abdomen 01/07/2022 FINDINGS: Lower chest: Heart is normal size. Minimal calcified plaque over the descending thoracic aorta. Visualized lung bases are normal for a tiny amount of right pleural fluid. Hepatobiliary: Previous cholecystectomy. There are several liver cysts unchanged. Biliary tree is normal. Pancreas: Fatty atrophy of the pancreas which is otherwise normal. Spleen: Normal. Adrenals/Urinary Tract: Adrenal glands are normal. Kidneys are normal in size. No focal mass or hydronephrosis. No nephrolithiasis. 2.8 cm hypodense mass over the upper pole left kidney unchanged compatible with slightly hyperdense cyst. Ureters and bladder are normal. Stomach/Bowel: Possible small sliding hiatal hernia unchanged. Stomach is otherwise unremarkable. Small bowel is notable for wall thickening of a moderate segment of the distal ileum. There is focal circumferential narrowing with slight  associated increased soft tissue density/enhancement involving the ascending colon in the region of the hepatic flexure. This is concerning for a focal mass/neoplasm. Just proximal to this narrowing, the cecum and ascending colon is fluid-filled and mildly dilated measuring 7.6 cm in diameter. Appendix is enlarged measuring 1.3 cm distally near the tip and demonstrates mucosal enhancement. There is a small amount of free fluid in the right lower quadrant as findings are concerning for early acute appendicitis. Vascular/Lymphatic: Mild calcified plaque over the abdominal aorta which is normal in caliber. No adenopathy. Reproductive: Status post hysterectomy. No adnexal masses. Other: Mild free fluid over the perisplenic region and minimally over the right pericolic gutter in the right lower quadrant and in the pelvis. Musculoskeletal: No focal abnormality. IMPRESSION: 1. Enlarged appendix measuring 1.3 cm in diameter with mucosal enhancement and mild adjacent free fluid in the right lower quadrant. Findings are concerning for mild acute appendicitis. 2. Focal circumferential narrowing with slight associated increased soft tissue density/enhancement over the ascending colon in the region of the hepatic flexure. This is concerning for a focal mass/neoplasm. Consider colonoscopy for further evaluation. 3. Wall thickening of a moderate segment of the distal ileum. Findings may be due to small bowel enteritis of infectious or inflammatory nature. 4. Mild free peritoneal fluid. 5. Several liver cysts unchanged. 6. 2.8 cm hypodense mass over the upper pole left kidney unchanged compatible with slightly hyperdense cyst. 7. Aortic atherosclerosis. Aortic Atherosclerosis (ICD10-I70.0). These results were called by telephone at the time of interpretation on 06/07/2024 at 5:05 pm to provider [facility] , who verbally acknowledged these results. Electronically Signed   By: Toribio Agreste M.D.   On: 06/07/2024 17:05         Scheduled Meds:  allopurinol   100 mg Oral Daily   atropine  1 drop Left Eye Daily   azelastine   2 spray Each Nare BID   brimonidine   1 drop Left Eye BID   budesonide -glycopyrrolate -formoterol   2 puff Inhalation BID   citalopram   40 mg Oral Daily   diltiazem   180 mg Oral Daily   erythromycin   Left Eye QHS   latanoprost   1 drop Left Eye QHS   loratadine  10 mg Oral Daily   metoprolol  succinate  100 mg Oral Daily   pantoprazole  (PROTONIX ) IV  40 mg Intravenous Q24H   rosuvastatin   40 mg Oral Daily   sodium bicarbonate   1,300 mg Oral Daily   Continuous Infusions:  sodium chloride  75 mL/hr at 06/08/24 0615     LOS: 1 day   Time spent:  Elsie JAYSON [place], DO [facility]  If 7PM-7AM, please contact night-coverage [email or web address]  06/08/2024, 7:56 AM

## 2024-06-08 NOTE — Plan of Care (Signed)

## 2024-06-08 NOTE — Progress Notes (Signed)
 Progress Note     Subjective: Patient reports that she had an episode of vomiting this morning about 3 hours ago. Denies nausea currently. Reports bowel movement this morning that was normal for her. Denies flatulence. Denies pain.   ROS  All negative with the exception of above.  Objective: Vital signs in last 24 hours: Temp:  [98 F (36.7 C)-98.4 F (36.9 C)] 98.4 F (36.9 C) (11/17 0716) Pulse Rate:  [78-85] 83 (11/17 0907) Resp:  [12-21] 20 (11/17 0716) BP: (152-182)/(82-92) 167/82 (11/17 0907) SpO2:  [99 %-100 %] 99 % (11/17 0933) Weight:  [80.5 kg] 80.5 kg (11/16 1855) Last BM Date : 06/06/24  Intake/Output from previous day: 11/16 0701 - 11/17 0700 In: 780.6 [P.O.:180; I.V.:600.6] Out: 300 [Urine:300] Intake/Output this shift: Total I/O In: -  Out: 500 [Urine:500]  PE: General: Pleasant female who is laying in bed in NAD. HEENT: Head is normocephalic, atraumatic. Heart: HR normal during encounter.  Lungs: Respiratory effort nonlabored. Abd: Soft with some distention. Tenderness to palpation of epigastric region and left upper quadrant. No guarding or rebound tenderness.  MS: Able to move extremities.  Skin: Warm and dry. Psych: Speech normal and responds appropriately.   Lab Results:  Recent Labs    06/07/24 1350 06/08/24 0404  WBC 8.5 10.4  HGB 10.3* 10.3*  HCT 32.2* 32.8*  PLT 322 320   BMET Recent Labs    06/07/24 1350 06/08/24 0404  NA 141 144  K 4.1 3.7  CL 109 111  CO2 22 19*  GLUCOSE 108* 104*  BUN 13 12  CREATININE 1.61* 1.49*  CALCIUM  8.5* 8.5*   PT/INR Recent Labs    06/08/24 0404  LABPROT 15.4*  INR 1.2   CMP     Component Value Date/Time   NA 144 06/08/2024 0404   NA 143 06/01/2024 0958   K 3.7 06/08/2024 0404   CL 111 06/08/2024 0404   CO2 19 (L) 06/08/2024 0404   GLUCOSE 104 (H) 06/08/2024 0404   GLUCOSE 96 05/27/2006 1052   BUN 12 06/08/2024 0404   BUN 16 06/01/2024 0958   CREATININE 1.49 (H) 06/08/2024  0404   CREATININE 0.90 01/01/2014 1046   CALCIUM  8.5 (L) 06/08/2024 0404   PROT 5.8 (L) 06/08/2024 0404   PROT 4.8 (L) 06/01/2024 0958   ALBUMIN 2.7 (L) 06/08/2024 0404   ALBUMIN 2.5 (L) 06/01/2024 0958   AST 21 06/08/2024 0404   ALT 13 06/08/2024 0404   ALKPHOS 176 (H) 06/08/2024 0404   BILITOT 0.3 06/08/2024 0404   BILITOT 0.2 06/01/2024 0958   GFRNONAA 37 (L) 06/08/2024 0404   GFRAA 39 (L) 09/24/2018 1054   Lipase     Component Value Date/Time   LIPASE <10 (L) 06/07/2024 1350       Studies/Results: CT ABDOMEN PELVIS W CONTRAST Result Date: 06/07/2024 CLINICAL DATA:  Possible bowel obstruction. Abdominal pain beginning yesterday. EXAM: CT ABDOMEN AND PELVIS WITH CONTRAST TECHNIQUE: Multidetector CT imaging of the abdomen and pelvis was performed using the standard protocol following bolus administration of intravenous contrast. RADIATION DOSE REDUCTION: This exam was performed according to the departmental dose-optimization program which includes automated exposure control, adjustment of the mA and/or kV according to patient size and/or use of iterative reconstruction technique. CONTRAST:  80mL OMNIPAQUE IOHEXOL 300 MG/ML  SOLN COMPARISON:  CT 04/28/2024, MRI abdomen 01/07/2022 FINDINGS: Lower chest: Heart is normal size. Minimal calcified plaque over the descending thoracic aorta. Visualized lung bases are normal for a tiny amount  of right pleural fluid. Hepatobiliary: Previous cholecystectomy. There are several liver cysts unchanged. Biliary tree is normal. Pancreas: Fatty atrophy of the pancreas which is otherwise normal. Spleen: Normal. Adrenals/Urinary Tract: Adrenal glands are normal. Kidneys are normal in size. No focal mass or hydronephrosis. No nephrolithiasis. 2.8 cm hypodense mass over the upper pole left kidney unchanged compatible with slightly hyperdense cyst. Ureters and bladder are normal. Stomach/Bowel: Possible small sliding hiatal hernia unchanged. Stomach is otherwise  unremarkable. Small bowel is notable for wall thickening of a moderate segment of the distal ileum. There is focal circumferential narrowing with slight associated increased soft tissue density/enhancement involving the ascending colon in the region of the hepatic flexure. This is concerning for a focal mass/neoplasm. Just proximal to this narrowing, the cecum and ascending colon is fluid-filled and mildly dilated measuring 7.6 cm in diameter. Appendix is enlarged measuring 1.3 cm distally near the tip and demonstrates mucosal enhancement. There is a small amount of free fluid in the right lower quadrant as findings are concerning for early acute appendicitis. Vascular/Lymphatic: Mild calcified plaque over the abdominal aorta which is normal in caliber. No adenopathy. Reproductive: Status post hysterectomy. No adnexal masses. Other: Mild free fluid over the perisplenic region and minimally over the right pericolic gutter in the right lower quadrant and in the pelvis. Musculoskeletal: No focal abnormality. IMPRESSION: 1. Enlarged appendix measuring 1.3 cm in diameter with mucosal enhancement and mild adjacent free fluid in the right lower quadrant. Findings are concerning for mild acute appendicitis. 2. Focal circumferential narrowing with slight associated increased soft tissue density/enhancement over the ascending colon in the region of the hepatic flexure. This is concerning for a focal mass/neoplasm. Consider colonoscopy for further evaluation. 3. Wall thickening of a moderate segment of the distal ileum. Findings may be due to small bowel enteritis of infectious or inflammatory nature. 4. Mild free peritoneal fluid. 5. Several liver cysts unchanged. 6. 2.8 cm hypodense mass over the upper pole left kidney unchanged compatible with slightly hyperdense cyst. 7. Aortic atherosclerosis. Aortic Atherosclerosis (ICD10-I70.0). These results were called by telephone at the time of interpretation on 06/07/2024 at 5:05  pm to provider [facility] , who verbally acknowledged these results. Electronically Signed   By: Toribio Agreste M.D.   On: 06/07/2024 17:05    Anti-infectives: Anti-infectives (From admission, onward)    None        Assessment/Plan 73 year old femal having abd pain/nausea/vomiting with concerns of hepatic flexure stricture, terminal ileitis and a liver mass. Dilation of appendix also noted.  -CT showed enlarged appendix measuring 1.3 cm in diameter with mucosal enhancement and mild adjacent free fluid in the right lower quadrant. Findings are concerning for mild acute appendicitis. Focal circumferential narrowing with slight associated increased soft tissue density/enhancement over the ascending colon in the region of the hepatic flexure. This is concerning for a focal mass/neoplasm. Consider colonoscopy for further evaluation. Wall thickening of a moderate segment of the distal ileum. Findings may be due to small bowel enteritis of infectious or inflammatory nature. Mild free peritoneal fluid. Several liver cysts unchanged. 2.8 cm hypodense mass over the upper pole left kidney unchanged compatible with slightly hyperdense cyst. Aortic atherosclerosis. -Afebrile. -Pain only on exam. Pain significant of epigastric region and left upper quadrant pain. No pain of RLQ. Having bowel function. Did have episode of vomiting this morning. No nausea now.  -Will likely need PICC/TPN. -GI to consult today -Will likely need surgical intervention during this admission.  -Will continue to  follow.   FEN: NPO; IVF per primary team VTE: None currently ID: None currently    LOS: 1 day   I reviewed specialist notes, hospitalist notes, consulting provider notes, nursing notes, last 24 h vitals and pain scores, last 48 h intake and output, last 24 h labs and trends, and last 24 h imaging results.  This care required moderate level of medical decision making.    [name], [facility] 06/08/2024, 11:27 AM Please see Amion for pager number during day hours 7:00am-4:30pm

## 2024-06-08 NOTE — Plan of Care (Signed)
  Problem: Education: Goal: Knowledge of General Education information will improve Description: Including pain rating scale, medication(s)/side effects and non-pharmacologic comfort measures Outcome: Progressing   Problem: Activity: Goal: Risk for activity intolerance will decrease Outcome: Progressing   Problem: Coping: Goal: Level of anxiety will decrease Outcome: Progressing   Problem: Nutrition: Goal: Adequate nutrition will be maintained Outcome: Progressing   Problem: Elimination: Goal: Will not experience complications related to bowel motility Outcome: Progressing Goal: Will not experience complications related to urinary retention Outcome: Progressing   Problem: Pain Managment: Goal: General experience of comfort will improve and/or be controlled Outcome: Progressing   Problem: Safety: Goal: Ability to remain free from injury will improve Outcome: Progressing

## 2024-06-08 NOTE — Progress Notes (Signed)
 RN tried to give patient some clear liquid chicken broth, but patient threw it back up. Patient was able to keep down some ginger ale.

## 2024-06-08 NOTE — Consult Note (Addendum)
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  --------------------------------------------------------------------------------------------------------------------  I have taken a history, reviewed the chart and examined the patient.  I personally reviewed the patient's CT scans from September, October and this admission.  I performed a substantive portion of this encounter, including complete performance of at least one of the key components, in conjunction with the APP. I agree with the APP's note, impression and recommendations.  73 year old female admitted with recurrent abdominal pain with nausea and vomiting, with CT findings of focal narrowing near the hepatic flexure with dilated ascending colon. She had an admission last month with diffuse small bowel dilation most consistent with ileus.  Review of the CT images shows what looked to be the same fixed narrowing at the hepatic flexure last month as well.  Admission in September for nausea and vomiting showed inflammatory changes in the ascending colon.  She had a colonoscopy 4 years ago which was notable for pandiverticulosis.  No polyps or masses noted.  Etiology of her colonic narrowing unclear.  Malignancy seems less likely given recent normal  colonoscopy, but needs to be excluded. She will likely need a resection regardless to reduce risk of recurrent obstruction.  Patient requesting to advance diet.  Will advance to clears this evening.  If she is tolerating clears, then we will do a gradual bowel prep tomorrow with plans for a colonoscopy Wednesday to better assess the nature of her colonic stenosis.   Jouri Threat E. Stacia, MD [facility]   Moderate complex medical decision making (this includes chart review, review of results, face-to-face time used for counseling as well as treatment plan and follow-up. The patient was provided an opportunity to ask questions and all were answered. The patient agreed with the plan and demonstrated an understanding of the instructions

## 2024-06-08 NOTE — Progress Notes (Signed)
 RN notified MD that RN attempted to give patient her morning meds; but not too long after RN gave patient the meds, patient threw them back up. RN gave patient PRN IV Zofran .

## 2024-06-09 ENCOUNTER — Other Ambulatory Visit: Payer: Self-pay

## 2024-06-09 DIAGNOSIS — K56699 Other intestinal obstruction unspecified as to partial versus complete obstruction: Secondary | ICD-10-CM | POA: Diagnosis not present

## 2024-06-09 DIAGNOSIS — K6389 Other specified diseases of intestine: Secondary | ICD-10-CM | POA: Diagnosis not present

## 2024-06-09 DIAGNOSIS — K529 Noninfective gastroenteritis and colitis, unspecified: Secondary | ICD-10-CM | POA: Diagnosis not present

## 2024-06-09 DIAGNOSIS — R112 Nausea with vomiting, unspecified: Secondary | ICD-10-CM | POA: Diagnosis not present

## 2024-06-09 LAB — BASIC METABOLIC PANEL WITH GFR
Anion gap: 11 (ref 5–15)
BUN: 12 mg/dL (ref 8–23)
CO2: 21 mmol/L — ABNORMAL LOW (ref 22–32)
Calcium: 8 mg/dL — ABNORMAL LOW (ref 8.9–10.3)
Chloride: 112 mmol/L — ABNORMAL HIGH (ref 98–111)
Creatinine, Ser: 1.47 mg/dL — ABNORMAL HIGH (ref 0.44–1.00)
GFR, Estimated: 38 mL/min — ABNORMAL LOW (ref >60)
Glucose, Bld: 91 mg/dL (ref 70–99)
Potassium: 3.2 mmol/L — ABNORMAL LOW (ref 3.5–5.1)
Sodium: 144 mmol/L (ref 135–145)

## 2024-06-09 LAB — GLUCOSE, CAPILLARY
Glucose-Capillary: 106 mg/dL — ABNORMAL HIGH (ref 70–99)
Glucose-Capillary: 114 mg/dL — ABNORMAL HIGH (ref 70–99)
Glucose-Capillary: 131 mg/dL — ABNORMAL HIGH (ref 70–99)
Glucose-Capillary: 264 mg/dL — ABNORMAL HIGH (ref 70–99)
Glucose-Capillary: 97 mg/dL (ref 70–99)

## 2024-06-09 LAB — CEA: CEA: 4.4 ng/mL (ref 0.0–4.7)

## 2024-06-09 LAB — SEDIMENTATION RATE: Sed Rate: 10 mm/h (ref 0–22)

## 2024-06-09 LAB — C-REACTIVE PROTEIN: CRP: 1.1 mg/dL — ABNORMAL HIGH (ref <1.0)

## 2024-06-09 MED ORDER — ONDANSETRON HCL 4 MG/2ML IJ SOLN
4.0000 mg | Freq: Once | INTRAMUSCULAR | Status: AC
  Administered 2024-06-09: 4 mg via INTRAVENOUS
  Filled 2024-06-09: qty 2

## 2024-06-09 MED ORDER — TRAVASOL 10 % IV SOLN
INTRAVENOUS | Status: AC
  Filled 2024-06-09: qty 480

## 2024-06-09 MED ORDER — SODIUM CHLORIDE 0.9% FLUSH: 10.0000 mL | INTRAVENOUS | Status: DC | PRN

## 2024-06-09 MED ORDER — NA SULFATE-K SULFATE-MG SULF 17.5-3.13-1.6 GM/177ML PO SOLN
0.5000 | Freq: Once | ORAL | Status: AC
  Administered 2024-06-09: 177 mL via ORAL
  Filled 2024-06-09: qty 1

## 2024-06-09 MED ORDER — POTASSIUM CHLORIDE 10 MEQ/100ML IV SOLN
10.0000 meq | INTRAVENOUS | Status: AC
  Administered 2024-06-09 (×4): 10 meq via INTRAVENOUS
  Filled 2024-06-09 (×4): qty 100

## 2024-06-09 MED ORDER — NA SULFATE-K SULFATE-MG SULF 17.5-3.13-1.6 GM/177ML PO SOLN
0.5000 | Freq: Once | ORAL | Status: AC
  Administered 2024-06-09: 177 mL via ORAL

## 2024-06-09 MED ORDER — BOOST / RESOURCE BREEZE PO LIQD CUSTOM
1.0000 | Freq: Three times a day (TID) | ORAL | Status: DC
  Administered 2024-06-09 – 2024-06-11 (×5): 1 via ORAL

## 2024-06-09 MED ORDER — CHLORHEXIDINE GLUCONATE CLOTH 2 % EX PADS
6.0000 | MEDICATED_PAD | Freq: Every day | CUTANEOUS | Status: DC
  Administered 2024-06-09 – 2024-06-12 (×4): 6 via TOPICAL

## 2024-06-09 MED ORDER — SODIUM CHLORIDE 0.9 % IV SOLN: INTRAVENOUS | Status: AC

## 2024-06-09 MED ORDER — INSULIN ASPART 100 UNIT/ML IJ SOLN
0.0000 [IU] | INTRAMUSCULAR | Status: DC
  Administered 2024-06-09: 1 [IU] via SUBCUTANEOUS
  Administered 2024-06-09: 5 [IU] via SUBCUTANEOUS
  Administered 2024-06-10: 1 [IU] via SUBCUTANEOUS
  Administered 2024-06-10: 2 [IU] via SUBCUTANEOUS
  Administered 2024-06-10 (×3): 1 [IU] via SUBCUTANEOUS
  Administered 2024-06-11 (×3): 2 [IU] via SUBCUTANEOUS
  Administered 2024-06-12 (×3): 1 [IU] via SUBCUTANEOUS
  Filled 2024-06-09: qty 2
  Filled 2024-06-09 (×2): qty 1
  Filled 2024-06-09: qty 2
  Filled 2024-06-09 (×2): qty 1
  Filled 2024-06-09: qty 2
  Filled 2024-06-09 (×3): qty 1
  Filled 2024-06-09: qty 5
  Filled 2024-06-09: qty 2
  Filled 2024-06-09: qty 1

## 2024-06-09 NOTE — Progress Notes (Signed)
 Peripherally Inserted Central Catheter Placement  The IV Nurse has discussed with the patient and/or persons authorized to consent for the patient, the purpose of this procedure and the potential benefits and risks involved with this procedure.  The benefits include less needle sticks, lab draws from the catheter, and the patient may be discharged home with the catheter. Risks include, but not limited to, infection, bleeding, blood clot (thrombus formation), and puncture of an artery; nerve damage and irregular heartbeat and possibility to perform a PICC exchange if needed/ordered by physician.  Alternatives to this procedure were also discussed.  Bard Power PICC patient education guide, fact sheet on infection prevention and patient information card has been provided to patient /or left at bedside.    PICC Placement Documentation  PICC Double Lumen 06/09/24 Right Brachial 33 cm 0 cm (Active)  Indication for Insertion or Continuance of Line Administration of hyperosmolar/irritating solutions (i.e. TPN, Vancomycin, etc.) 06/09/24 1058  Exposed Catheter (cm) 0 cm 06/09/24 1058  Site Assessment Clean, Dry, Intact 06/09/24 1058  Lumen #1 Status Flushed;Saline locked;Blood return noted 06/09/24 1058  Lumen #2 Status Flushed;Saline locked;Blood return noted 06/09/24 1058  Dressing Type Transparent;Securing device 06/09/24 1058  Dressing Status Antimicrobial disc/dressing in place;Clean, Dry, Intact 06/09/24 1058  Line Care Connections checked and tightened 06/09/24 1058  Line Adjustment (NICU/IV Team Only) No 06/09/24 1058  Dressing Intervention New dressing;Adhesive placed at insertion site (IV team only) 06/09/24 1058  Dressing Change Due 06/16/24 06/09/24 1058       Renaee Notice Albarece 06/09/2024, 11:15 AM

## 2024-06-09 NOTE — Progress Notes (Signed)
 Progress Note     Subjective: Patient reports generalized abdominal pain. Tolerated some ginger ale yesterday and some ice. Had episode of vomiting with broth. Having some nausea currently. Had a BM yesterday that she considers normal for her. Also having flatulence.  ROS  All negative with the exception of above.  Objective: Vital signs in last 24 hours: Temp:  [98.2 F (36.8 C)-98.7 F (37.1 C)] 98.7 F (37.1 C) (11/18 0504) Pulse Rate:  [78-85] 78 (11/18 0850) Resp:  [18-20] 18 (11/18 0850) BP: (170-183)/(81-99) 180/91 (11/18 0613) SpO2:  [99 %-100 %] 99 % (11/18 0504) Last BM Date : 06/08/24  Intake/Output from previous day: 11/17 0701 - 11/18 0700 In: 1389.1 [P.O.:360; I.V.:1029.1] Out: 750 [Urine:750] Intake/Output this shift: No intake/output data recorded.  PE: General: Pleasant female who is laying in bed in NAD. HEENT: Head is normocephalic, atraumatic. Heart: HR normal during encounter.  Lungs: Respiratory effort nonlabored. Abd: Soft with some distention. Generalized tenderness to palpation. No guarding or rebound tenderness.  MS: Able to move extremities.  Skin: Warm and dry. Psych: Speech normal and responds appropriately.   Lab Results:  Recent Labs    06/07/24 1350 06/08/24 0404  WBC 8.5 10.4  HGB 10.3* 10.3*  HCT 32.2* 32.8*  PLT 322 320   BMET Recent Labs    06/08/24 0404 06/09/24 0413  NA 144 144  K 3.7 3.2*  CL 111 112*  CO2 19* 21*  GLUCOSE 104* 91  BUN 12 12  CREATININE 1.49* 1.47*  CALCIUM  8.5* 8.0*   PT/INR Recent Labs    06/08/24 0404  LABPROT 15.4*  INR 1.2   CMP     Component Value Date/Time   NA 144 06/09/2024 0413   NA 143 06/01/2024 0958   K 3.2 (L) 06/09/2024 0413   CL 112 (H) 06/09/2024 0413   CO2 21 (L) 06/09/2024 0413   GLUCOSE 91 06/09/2024 0413   GLUCOSE 96 05/27/2006 1052   BUN 12 06/09/2024 0413   BUN 16 06/01/2024 0958   CREATININE 1.47 (H) 06/09/2024 0413   CREATININE 0.90 01/01/2014 1046    CALCIUM  8.0 (L) 06/09/2024 0413   PROT 5.8 (L) 06/08/2024 0404   PROT 4.8 (L) 06/01/2024 0958   ALBUMIN 2.7 (L) 06/08/2024 0404   ALBUMIN 2.5 (L) 06/01/2024 0958   AST 21 06/08/2024 0404   ALT 13 06/08/2024 0404   ALKPHOS 176 (H) 06/08/2024 0404   BILITOT 0.3 06/08/2024 0404   BILITOT 0.2 06/01/2024 0958   GFRNONAA 38 (L) 06/09/2024 0413   GFRAA 39 (L) 09/24/2018 1054   Lipase     Component Value Date/Time   LIPASE <10 (L) 06/07/2024 1350       Studies/Results: US  EKG SITE RITE Result Date: 06/09/2024 If Site Rite image not attached, placement could not be confirmed due to current cardiac rhythm.  CT ABDOMEN PELVIS W CONTRAST Result Date: 06/07/2024 CLINICAL DATA:  Possible bowel obstruction. Abdominal pain beginning yesterday. EXAM: CT ABDOMEN AND PELVIS WITH CONTRAST TECHNIQUE: Multidetector CT imaging of the abdomen and pelvis was performed using the standard protocol following bolus administration of intravenous contrast. RADIATION DOSE REDUCTION: This exam was performed according to the departmental dose-optimization program which includes automated exposure control, adjustment of the mA and/or kV according to patient size and/or use of iterative reconstruction technique. CONTRAST:  80mL OMNIPAQUE IOHEXOL 300 MG/ML  SOLN COMPARISON:  CT 04/28/2024, MRI abdomen 01/07/2022 FINDINGS: Lower chest: Heart is normal size. Minimal calcified plaque over the descending thoracic  aorta. Visualized lung bases are normal for a tiny amount of right pleural fluid. Hepatobiliary: Previous cholecystectomy. There are several liver cysts unchanged. Biliary tree is normal. Pancreas: Fatty atrophy of the pancreas which is otherwise normal. Spleen: Normal. Adrenals/Urinary Tract: Adrenal glands are normal. Kidneys are normal in size. No focal mass or hydronephrosis. No nephrolithiasis. 2.8 cm hypodense mass over the upper pole left kidney unchanged compatible with slightly hyperdense cyst. Ureters and  bladder are normal. Stomach/Bowel: Possible small sliding hiatal hernia unchanged. Stomach is otherwise unremarkable. Small bowel is notable for wall thickening of a moderate segment of the distal ileum. There is focal circumferential narrowing with slight associated increased soft tissue density/enhancement involving the ascending colon in the region of the hepatic flexure. This is concerning for a focal mass/neoplasm. Just proximal to this narrowing, the cecum and ascending colon is fluid-filled and mildly dilated measuring 7.6 cm in diameter. Appendix is enlarged measuring 1.3 cm distally near the tip and demonstrates mucosal enhancement. There is a small amount of free fluid in the right lower quadrant as findings are concerning for early acute appendicitis. Vascular/Lymphatic: Mild calcified plaque over the abdominal aorta which is normal in caliber. No adenopathy. Reproductive: Status post hysterectomy. No adnexal masses. Other: Mild free fluid over the perisplenic region and minimally over the right pericolic gutter in the right lower quadrant and in the pelvis. Musculoskeletal: No focal abnormality. IMPRESSION: 1. Enlarged appendix measuring 1.3 cm in diameter with mucosal enhancement and mild adjacent free fluid in the right lower quadrant. Findings are concerning for mild acute appendicitis. 2. Focal circumferential narrowing with slight associated increased soft tissue density/enhancement over the ascending colon in the region of the hepatic flexure. This is concerning for a focal mass/neoplasm. Consider colonoscopy for further evaluation. 3. Wall thickening of a moderate segment of the distal ileum. Findings may be due to small bowel enteritis of infectious or inflammatory nature. 4. Mild free peritoneal fluid. 5. Several liver cysts unchanged. 6. 2.8 cm hypodense mass over the upper pole left kidney unchanged compatible with slightly hyperdense cyst. 7. Aortic atherosclerosis. Aortic Atherosclerosis  (ICD10-I70.0). These results were called by telephone at the time of interpretation on 06/07/2024 at 5:05 pm to provider [facility] , who verbally acknowledged these results. Electronically Signed   By: Toribio Agreste M.D.   On: 06/07/2024 17:05    Anti-infectives: Anti-infectives (From admission, onward)    None        Assessment/Plan 73 year old femal having abd pain/nausea/vomiting with concerns of hepatic flexure stricture, terminal ileitis and a liver mass. Dilation of appendix also noted.  -CT showed enlarged appendix measuring 1.3 cm in diameter with mucosal enhancement and mild adjacent free fluid in the right lower quadrant. Findings are concerning for mild acute appendicitis. Focal circumferential narrowing with slight associated increased soft tissue density/enhancement over the ascending colon in the region of the hepatic flexure. This is concerning for a focal mass/neoplasm. Consider colonoscopy for further evaluation. Wall thickening of a moderate segment of the distal ileum. Findings may be due to small bowel enteritis of infectious or inflammatory nature. Mild free peritoneal fluid. Several liver cysts unchanged. 2.8 cm hypodense mass over the upper pole left kidney unchanged compatible with slightly hyperdense cyst. Aortic atherosclerosis. -Afebrile. -Generalized abdominal pain on exam. Having bowel function. Did tolerate ginger ale. Having some nausea.  -PICC/TPN has been ordered. -GI consulted. Planning for gradual bowel prep with plans for colonoscopy Wednesday 06/10/2024. -Will likely need surgical intervention during this admission.  -  Will continue to follow.     FEN: CLD; IVF per primary team VTE: None currently ID: None currently    LOS: 2 days   I reviewed consulting provider notes, specialist notes, nursing notes, hospitalist notes, last 24 h vitals and pain scores, last 48 h intake and output, last 24 h labs and trends, and last 24 h imaging results.  This  care required moderate level of medical decision making.    [name], [facility] 06/09/2024, 9:15 AM Please see Amion for pager number during day hours 7:00am-4:30pm

## 2024-06-09 NOTE — Progress Notes (Signed)
 PHARMACY - TOTAL PARENTERAL NUTRITION CONSULT NOTE   Indication: Planned SBR  Patient Measurements: Height: 5' 3 (160 cm) Weight: 80.5 kg (177 lb 7.5 oz) IBW/kg (Calculated) : 52.4 TPN AdjBW (KG): 59.4 Body mass index is 31.44 kg/m. Usual Weight:   Assessment: Abdominal pain, NV, unable to keep anything down. Abd pain/nausea/vomiting with concerns of hepatic flexure stricture, terminal ileitis and a liver mass. Dilation of appendix also noted concerning for mild acute appendicitis Will need colonoscopy and possible surgical intervention. Tentatively plan colonoscopy Wed. 11/19  Glucose / Insulin : +DM not currently on any treatment noted Electrolytes: K 3.2 low, Cl 112 elevated, Bicarb 19>>21 low end. CoCa 9.04 Renal: CKD3 with Scr 1.47 Hepatic: Elevated AlkPhos, other WNL Intake / Output; MIVF:  CLD: yesterday MIVF: none LBM: 11/17  GI Imaging: 11/16 CT:  Enlarged appendix concerning for mild acute appendicitis.Circumferential narrowing with slight associated increased soft tissue density/enhancement over the ascending colon in the region of the hepatic flexure. This is concerning for a focal mass/neoplasm. Wall thickening of a moderate segment of the distal ileum. Findings may be due to small bowel enteritis of infectious or inflammatory nature.  GI Surgeries / Procedures:  Colonoscopy: 11/19? Surgery: TBD  Central access: PICC 11/18 TPN start date: 11/18  Nutritional Goals: Goal TPN rate is ______mL/hr (provides _____ g of protein and _____ kcals per day)  RD Assessment: TBD    Current Nutrition:  Clear liquids  Plan:  Start TPN at 40 mL/hr at 1800 (48g protein, 971 kcal) Electrolytes in TPN: Na 8mEq/L, K 50mEq/L, Ca 46mEq/L, Mg 36mEq/L, and Phos 15mmol/L. Cl:Ac 1:1 Add standard MVI and trace elements to TPN Initiate Sensitive q4h SSI and adjust as needed  No MIVF Monitor TPN labs on Mon/Thurs, and PRN K runs 10meq Iv x 4 per MD  Rana Hochstein Karoline Marina, PharmD, BCPS Clinical Staff Pharmacist Marina Salines Stillinger 06/09/2024,8:23 AM

## 2024-06-09 NOTE — Progress Notes (Signed)
 2 PROGRESS NOTE    Brooke [name]  FMW:[id number] DOB: February 12, 1951 DOA: 06/07/2024 PCP: Norleen Lynwood ORN, MD   Brief Narrative:  Brooke [name] is a 73 y.o. female with medical history significant of HTN, HLD, GERD, gout, asthma, anxiety, hypothyroidism, T2DM, CKD stage 3b who presented to ED with complaints of abdominal pain and nausea and vomiting that started a few days prior to admission.  At intake patient had notable CT abdomen pelvis with ascending colonic mass concerning for obstructive pathology, general surgery and GI consulted for assistance, hospitalist called for admission.  Assessment & Plan:   Principal Problem:   abdominal pain with nausea and vomiting and findings concerning for focal mass in ascedning colon Active Problems:   Appendix disease   DM2 (diabetes mellitus, type 2) (HCC)   Hyperlipidemia   Gout   Normochromic normocytic anemia   Essential hypertension   Asthma   GERD (gastroesophageal reflux disease)   Depression with anxiety   CKD stage 3b, GFR 30-44 ml/min (HCC)   Colonic stricture (HCC)  Acute intractable abdominal pain with nausea and vomiting Rule out obstruction Ascending colonic mass, unspecified  -General Surgery and GI following; appreciate insight recommendations - Bowel prep ordered, consideration for endoscopy versus exploratory laparoscopy pending per their discussion - Possible gastritis, likely viral given negative procalcitonin, normal leukocytosis and no fevers - Patient remains n.p.o. at this time, given expected prolonged postoperative course TPN and PICC line placed today - Notably last bowel movement 11/16 am without complication   Acute appendicitis ruled out -Atypical findings on CT, general surgery consulted as above  - Clinically without signs or symptoms of acute appendicitis -possibly secondary to above mass and obstructive pathology  CKD stage 3b, GFR 30-44 ml/min (HCC) At baseline baseline (1.5-1.9) Depression with anxiety  Continue celexa   GERD (gastroesophageal reflux disease) PPI IV  Asthma Continue trelegy; No s/sx of exacerbation   Essential hypertension - Continue metoprolol /diltiazem . *Of note patient reports being on BID metoprolol  succ* - will need to confirm with pharmacy - continue once daily for now - Hydralazine  PRN SBP >160   Chronic normochromic normocytic anemia likely of chronic disease Potentially underlying occult GI bleed in the setting of mass Baseline hgb 8-9 -currently elevated likely secondary to hypovolemia and hemoconcentration B12 elevated Iron panel generally unremarkable other than elevated TIBC FOBT pending   Gout without flare, not currently on suppressants   Hyperlipidemia Continue statin  DM2 (diabetes mellitus, type 2) (HCC) A1C of 5.4 ;  Continue lifestyle modifications; no indication for hypoglycemic protocol or sliding scale insulin  at this time  DVT prophylaxis: SCDs Start: 06/07/24 2000 Place TED hose Start: 06/07/24 2000 Code Status:   Code Status: Full Code Family Communication: At bedside  Status is: Inpatient  Dispo: The patient is from: Home              Anticipated d/c is to: Home              Anticipated d/c date is: 72+ hours              Patient currently not medically stable for discharge  Consultants:  General Surgery, GI  Procedures:  Pending above, possible colonoscopy versus exploratory laparoscopy  Antimicrobials:  Perioperatively, none currently indicated  Subjective: No acute issues or events overnight, reports ongoing nausea but no vomiting otherwise denies constipation diarrhea headache fever chills chest pain shortness of breath  Objective: Vitals:   06/09/24 0504 06/09/24 0529 06/09/24 0608 06/09/24 0613  BP: ROLLEN)  177/99 (!) 183/97 (!) 178/91 (!) 180/91  Pulse: 81 80 78 79  Resp: 18     Temp: 98.7 F (37.1 C)     TempSrc: Oral     SpO2: 99%     Weight:      Height:        Intake/Output Summary (Last 24 hours) at  06/09/2024 0753 Last data filed at 06/09/2024 0600 Gross per 24 hour  Intake 1389.12 ml  Output 750 ml  Net 639.12 ml   Filed Weights   06/07/24 1855  Weight: 80.5 kg    Examination:  General exam: Appears calm and comfortable  Respiratory system: Clear to auscultation. Respiratory effort normal. Cardiovascular system: S1 & S2 heard, RRR. No JVD, murmurs, rubs, gallops or clicks. No pedal edema. Gastrointestinal system: Abdomen is nondistended, soft and nontender. No organomegaly or masses felt. Normal bowel sounds heard. Central nervous system: Alert and oriented. No focal neurological deficits. Extremities: Symmetric 5 x 5 power. Skin: No rashes, lesions or ulcers Psychiatry: Judgement and insight appear normal. Mood & affect appropriate.     Data Reviewed: I have personally reviewed following labs and imaging studies  CBC: Recent Labs  Lab 06/02/24 1611 06/07/24 1350 06/08/24 0404  WBC 7.7 8.5 10.4  NEUTROABS 4.9 7.0  --   HGB 9.7* 10.3* 10.3*  HCT 30.0* 32.2* 32.8*  MCV 83.4 84.3 86.3  PLT 281.0 322 320   Basic Metabolic Panel: Recent Labs  Lab 06/02/24 1611 06/07/24 1350 06/07/24 1842 06/08/24 0404 06/09/24 0413  NA 140 141  --  144 144  K 3.1* 4.1  --  3.7 3.2*  CL 106 109  --  111 112*  CO2 26 22  --  19* 21*  GLUCOSE 98 108*  --  104* 91  BUN 17 13  --  12 12  CREATININE 2.13* 1.61*  --  1.49* 1.47*  CALCIUM  8.1* 8.5*  --  8.5* 8.0*  MG  --   --  1.6* 2.4  --    GFR: Estimated Creatinine Clearance: 34.7 mL/min (A) (by C-G formula based on SCr of 1.47 mg/dL (H)). Liver Function Tests: Recent Labs  Lab 06/07/24 1350 06/08/24 0404  AST 27 21  ALT 12 13  ALKPHOS 180* 176*  BILITOT 0.3 0.3  PROT 5.7* 5.8*  ALBUMIN 2.9* 2.7*   Recent Labs  Lab 06/07/24 1350  LIPASE <10*   Coagulation Profile: Recent Labs  Lab 06/08/24 0404  INR 1.2   Anemia Panel: Recent Labs    06/07/24 1916  VITAMINB12 1,476*  FERRITIN 142  TIBC 207*  IRON  39   Sepsis Labs: Recent Labs  Lab 06/07/24 1841 06/07/24 1916 06/07/24 2100 06/08/24 0404  PROCALCITON 0.33  --   --  0.35  LATICACIDVEN  --  2.0* 1.3  --     No results found for this or any previous visit (from the past 240 hours).   Radiology Studies: CT ABDOMEN PELVIS W CONTRAST Result Date: 06/07/2024 CLINICAL DATA:  Possible bowel obstruction. Abdominal pain beginning yesterday. EXAM: CT ABDOMEN AND PELVIS WITH CONTRAST TECHNIQUE: Multidetector CT imaging of the abdomen and pelvis was performed using the standard protocol following bolus administration of intravenous contrast. RADIATION DOSE REDUCTION: This exam was performed according to the departmental dose-optimization program which includes automated exposure control, adjustment of the mA and/or kV according to patient size and/or use of iterative reconstruction technique. CONTRAST:  80mL OMNIPAQUE IOHEXOL 300 MG/ML  SOLN COMPARISON:  CT 04/28/2024, MRI  abdomen 01/07/2022 FINDINGS: Lower chest: Heart is normal size. Minimal calcified plaque over the descending thoracic aorta. Visualized lung bases are normal for a tiny amount of right pleural fluid. Hepatobiliary: Previous cholecystectomy. There are several liver cysts unchanged. Biliary tree is normal. Pancreas: Fatty atrophy of the pancreas which is otherwise normal. Spleen: Normal. Adrenals/Urinary Tract: Adrenal glands are normal. Kidneys are normal in size. No focal mass or hydronephrosis. No nephrolithiasis. 2.8 cm hypodense mass over the upper pole left kidney unchanged compatible with slightly hyperdense cyst. Ureters and bladder are normal. Stomach/Bowel: Possible small sliding hiatal hernia unchanged. Stomach is otherwise unremarkable. Small bowel is notable for wall thickening of a moderate segment of the distal ileum. There is focal circumferential narrowing with slight associated increased soft tissue density/enhancement involving the ascending colon in the region of the  hepatic flexure. This is concerning for a focal mass/neoplasm. Just proximal to this narrowing, the cecum and ascending colon is fluid-filled and mildly dilated measuring 7.6 cm in diameter. Appendix is enlarged measuring 1.3 cm distally near the tip and demonstrates mucosal enhancement. There is a small amount of free fluid in the right lower quadrant as findings are concerning for early acute appendicitis. Vascular/Lymphatic: Mild calcified plaque over the abdominal aorta which is normal in caliber. No adenopathy. Reproductive: Status post hysterectomy. No adnexal masses. Other: Mild free fluid over the perisplenic region and minimally over the right pericolic gutter in the right lower quadrant and in the pelvis. Musculoskeletal: No focal abnormality. IMPRESSION: 1. Enlarged appendix measuring 1.3 cm in diameter with mucosal enhancement and mild adjacent free fluid in the right lower quadrant. Findings are concerning for mild acute appendicitis. 2. Focal circumferential narrowing with slight associated increased soft tissue density/enhancement over the ascending colon in the region of the hepatic flexure. This is concerning for a focal mass/neoplasm. Consider colonoscopy for further evaluation. 3. Wall thickening of a moderate segment of the distal ileum. Findings may be due to small bowel enteritis of infectious or inflammatory nature. 4. Mild free peritoneal fluid. 5. Several liver cysts unchanged. 6. 2.8 cm hypodense mass over the upper pole left kidney unchanged compatible with slightly hyperdense cyst. 7. Aortic atherosclerosis. Aortic Atherosclerosis (ICD10-I70.0). These results were called by telephone at the time of interpretation on 06/07/2024 at 5:05 pm to provider [facility] , who verbally acknowledged these results. Electronically Signed   By: Toribio Agreste M.D.   On: 06/07/2024 17:05        Scheduled Meds:  allopurinol   100 mg Oral Daily   atropine  1 drop Left Eye Daily   azelastine   2  spray Each Nare BID   brimonidine   1 drop Left Eye BID   budesonide -glycopyrrolate -formoterol   2 puff Inhalation BID   citalopram   40 mg Oral Daily   diltiazem   180 mg Oral Daily   erythromycin   Left Eye QHS   feeding supplement  1 Container Oral TID BM   latanoprost   1 drop Left Eye QHS   loratadine  10 mg Oral Daily   metoprolol  succinate  100 mg Oral Daily   pantoprazole  (PROTONIX ) IV  40 mg Intravenous Q24H   rosuvastatin   40 mg Oral Daily   sodium bicarbonate   1,300 mg Oral Daily   Continuous Infusions:     LOS: 2 days   Time spent:  Elsie JAYSON Montclair, DO [facility]  If 7PM-7AM, please contact night-coverage [email or web address]  06/09/2024, 7:53 AM

## 2024-06-09 NOTE — Progress Notes (Signed)
 Initial Nutrition Assessment  DOCUMENTATION CODES:   Obesity unspecified  INTERVENTION:   -TPN management per Pharmacy -Daily weights while on TPN  -Monitor for diet advancement -Boost Breeze po TID, each supplement provides 250 kcal and 9 grams of protein as tolerated  NUTRITION DIAGNOSIS:   Inadequate oral intake related to nausea, vomiting as evidenced by per patient/family report.  GOAL:   Patient will meet greater than or equal to 90% of their needs  MONITOR:   PO intake (TPN)  REASON FOR ASSESSMENT:   Other (Comment) (TPN)    ASSESSMENT:   73 y.o. female with medical history significant of HTN, HLD, GERD, gout, asthma, anxiety, hypothyroidism, T2DM, CKD stage 3b who presented to ED with complaints of abdominal pain and nausea and vomiting that started a few days prior to admission. Patient had notable CT abdomen pelvis with ascending colonic mass concerning for obstructive pathology.  Patient in room, just had PICC line placed. Pt reports being unable to tolerate clear liquids at this time. States at home she was tolerating solids but was very vague about when she last ate solid foods. Reports not eating much lately. Noted recent admission in October for N/V r/t Mounjaro  use.  GI recommending colonoscopy, scheduled for 11/19. Surgery also planning intervention, possible resection.  TPN to begin tonight at 40 ml/hr, providing 971 kcals and 48g protein.  Per weight records, pt has lost 17 lbs since 5/7 (8% wt loss x 6.5 months, insignificant for time frame).  Medications: KCl, Zofran   Labs reviewed: CBGs: 106 Low potassium    NUTRITION - FOCUSED PHYSICAL EXAM:  Flowsheet Row Most Recent Value  Orbital Region No depletion  Upper Arm Region No depletion  Thoracic and Lumbar Region No depletion  Buccal Region No depletion  Temple Region No depletion  Clavicle Bone Region No depletion  Clavicle and Acromion Bone Region No depletion  Scapular Bone Region No  depletion  Dorsal Hand No depletion  Patellar Region No depletion  Anterior Thigh Region No depletion  Posterior Calf Region No depletion  Edema (RD Assessment) Mild  [BLE]  Hair Reviewed  Eyes Reviewed  [glaucoma]  Mouth Reviewed  Skin Reviewed  Nails Reviewed    Diet Order:   Diet Order             Diet clear liquid Room service appropriate? Yes; Fluid consistency: Thin  Diet effective now                   EDUCATION NEEDS:   No education needs have been identified at this time  Skin:  Skin Assessment: Reviewed RN Assessment  Last BM:  11/17 -type 1  Height:   Ht Readings from Last 1 Encounters:  06/07/24 5' 3 (1.6 m)    Weight:   Wt Readings from Last 1 Encounters:  06/07/24 80.5 kg    BMI:  Body mass index is 31.44 kg/m.  Estimated Nutritional Needs:   Kcal:  1550-1750  Protein:  75-85g  Fluid:  1.7L/day   Morna Lee, MS, RD, LDN Inpatient Clinical Dietitian Contact via Secure chat

## 2024-06-09 NOTE — Progress Notes (Addendum)
 [facility] Progress Note  CC:   N/V, abdominal pain, suspected stricture at the hepatic flexure   Subjective: She continues to have generalized abdominal pain, rates a 6 out of 10 at this time.  RN at the bedside, to administer pain med.  She vomited nonbloody emesis x 1 overnight without further recurrence.  She has tolerated minimal clear liquids today.  Her clear liquid lunch tray is sitting at the bedside untouched.  No BM today.  She denies having chest pain or shortness of breath.  No family at the bedside.   Objective:  Vital signs in last 24 hours: Temp:  [98.2 F (36.8 C)-98.7 F (37.1 C)] 98.2 F (36.8 C) (11/18 1324) Pulse Rate:  [78-85] 80 (11/18 1324) Resp:  [18-20] 18 (11/18 1324) BP: (158-183)/(85-99) 158/85 (11/18 1324) SpO2:  [99 %-100 %] 100 % (11/18 1324) Last BM Date : 06/08/24 General: Alert, well-developed in NAD Heart: Regular rate and rhythm, systolic murmur. Pulm: Breath sounds clear, decreased in the bases. Abdomen: Abdomen is mildly distended, not tense.  Generalized tenderness throughout without rebound or guarding.  Hypoactive bowel sounds to all 4 quadrants.  No palpable mass. Extremities: No lower extremity edema. Neurologic:  Alert and oriented x 4.  Speech is clear.  Moves all extremities equally. Psych:  Alert and cooperative. Normal mood and affect.  Intake/Output from previous day: 11/17 0701 - 11/18 0700 In: 1389.1 [P.O.:360; I.V.:1029.1] Out: 750 [Urine:750] Intake/Output this shift: No intake/output data recorded.  Lab Results: Recent Labs    06/07/24 1350 06/08/24 0404  WBC 8.5 10.4  HGB 10.3* 10.3*  HCT 32.2* 32.8*  PLT 322 320   BMET Recent Labs    06/07/24 1350 06/08/24 0404 06/09/24 0413  NA 141 144 144  K 4.1 3.7 3.2*  CL 109 111 112*  CO2 22 19* 21*  GLUCOSE 108* 104* 91  BUN 13 12 12   CREATININE 1.61* 1.49* 1.47*  CALCIUM  8.5* 8.5* 8.0*   LFT Recent Labs    06/08/24 0404  PROT 5.8*   ALBUMIN 2.7*  AST 21  ALT 13  ALKPHOS 176*  BILITOT 0.3   PT/INR Recent Labs    06/08/24 0404  LABPROT 15.4*  INR 1.2   Hepatitis Panel No results for input(s): HEPBSAG, HCVAB, HEPAIGM, HEPBIGM in the last 72 hours.  US  EKG SITE RITE Result Date: 06/09/2024 If Site Rite image not attached, placement could not be confirmed due to current cardiac rhythm.  CT ABDOMEN PELVIS W CONTRAST Result Date: 06/07/2024 CLINICAL DATA:  Possible bowel obstruction. Abdominal pain beginning yesterday. EXAM: CT ABDOMEN AND PELVIS WITH CONTRAST TECHNIQUE: Multidetector CT imaging of the abdomen and pelvis was performed using the standard protocol following bolus administration of intravenous contrast. RADIATION DOSE REDUCTION: This exam was performed according to the departmental dose-optimization program which includes automated exposure control, adjustment of the mA and/or kV according to patient size and/or use of iterative reconstruction technique. CONTRAST:  80mL OMNIPAQUE IOHEXOL 300 MG/ML  SOLN COMPARISON:  CT 04/28/2024, MRI abdomen 01/07/2022 FINDINGS: Lower chest: Heart is normal size. Minimal calcified plaque over the descending thoracic aorta. Visualized lung bases are normal for a tiny amount of right pleural fluid. Hepatobiliary: Previous cholecystectomy. There are several liver cysts unchanged. Biliary tree is normal. Pancreas: Fatty atrophy of the pancreas which is otherwise normal. Spleen: Normal. Adrenals/Urinary Tract: Adrenal glands are normal. Kidneys are normal in size. No focal mass or hydronephrosis. No nephrolithiasis. 2.8 cm hypodense mass over the upper  pole left kidney unchanged compatible with slightly hyperdense cyst. Ureters and bladder are normal. Stomach/Bowel: Possible small sliding hiatal hernia unchanged. Stomach is otherwise unremarkable. Small bowel is notable for wall thickening of a moderate segment of the distal ileum. There is focal circumferential narrowing with  slight associated increased soft tissue density/enhancement involving the ascending colon in the region of the hepatic flexure. This is concerning for a focal mass/neoplasm. Just proximal to this narrowing, the cecum and ascending colon is fluid-filled and mildly dilated measuring 7.6 cm in diameter. Appendix is enlarged measuring 1.3 cm distally near the tip and demonstrates mucosal enhancement. There is a small amount of free fluid in the right lower quadrant as findings are concerning for early acute appendicitis. Vascular/Lymphatic: Mild calcified plaque over the abdominal aorta which is normal in caliber. No adenopathy. Reproductive: Status post hysterectomy. No adnexal masses. Other: Mild free fluid over the perisplenic region and minimally over the right pericolic gutter in the right lower quadrant and in the pelvis. Musculoskeletal: No focal abnormality. IMPRESSION: 1. Enlarged appendix measuring 1.3 cm in diameter with mucosal enhancement and mild adjacent free fluid in the right lower quadrant. Findings are concerning for mild acute appendicitis. 2. Focal circumferential narrowing with slight associated increased soft tissue density/enhancement over the ascending colon in the region of the hepatic flexure. This is concerning for a focal mass/neoplasm. Consider colonoscopy for further evaluation. 3. Wall thickening of a moderate segment of the distal ileum. Findings may be due to small bowel enteritis of infectious or inflammatory nature. 4. Mild free peritoneal fluid. 5. Several liver cysts unchanged. 6. 2.8 cm hypodense mass over the upper pole left kidney unchanged compatible with slightly hyperdense cyst. 7. Aortic atherosclerosis. Aortic Atherosclerosis (ICD10-I70.0). These results were called by telephone at the time of interpretation on 06/07/2024 at 5:05 pm to provider [facility] , who verbally acknowledged these results. Electronically Signed   By: Toribio Agreste M.D.   On: 06/07/2024 17:05    Patient Profile: Brooke [name] is a 73 y.o. female with a past medical history of hypertension, aortic atherosclerosis, polymorphic VT, renal stenosis s/p right renal stent, DM type II, CKD stage III, COPD, gout, glaucoma, liver cysts, GERD and colon polyp (neurofibroma).  Past cholecystectomy. Admitted 06/07/2024 with left upper abdominal pain x 1 day with nausea and vomited x 2.     Assessment / Plan:  73 year old female admitted 06/07/2024 with recurrent N/V and upper abdominal pain. Alk phos 180 with normal T. Bili, AST and ALT levels. Lipase  < 10. CTAP with contrast 06/07/2024 identified an enlarged appendix measuring 1.3 cm in diameter with mucosal enhancement and mild adjacent free fluid in the RLQ concerning for acute appendicitis, focal circumferential narrowing with slight associated increased soft tissue density/enhancement over the ascending colon in the region of the hepatic flexure concerning for possible focal mass/stricture, wall thickening to the distal ileum and liver cysts. Patient was seen by general surgeon Dr. Bernarda Ned who reviewed CTAP images 11/16  and assessed the patient had a stricture at the hepatic flexure causing dilatation of the cecum and appendix without concern for acute appendicitis. Patient will most likely require surgical resection during this admission.  CRP 1.1.  Sed rate 10.  Afebrile.  BP 158/85.  Heart rate 80. Oxygen  saturation 100%. - Clear liquid diet - Defer IV fluids and pain management to the hospitalist - NPO after midnight  - Colonoscopy 06/10/2024 to rule out colon malignancy and IBD/Crohn's disease, benefits and risks  discussed including risk with sedation, risk of bleeding, perforation and infection  - I spoke to the patient's RN, patient to receive Ondansetron  4 mg IV prior to starting Suprep doses, patient to slowly sip on Suprep starting at 3 PM, second dose scheduled at 11 PM - Ondansetron  4mg  po or IV Q 6 hrs PRN - Appreciate general  surgery's recommendations   Chronic normocytic anemia. No overt GI bleeding.  - CBC in am  Hypokalemia. K+ 3.2.  - Management per the hospitalist   CKD stage IIIb, Cr 1.49 -> 1.47   PAT/PVC. Had nonsustained atrial tachycardia during her hospitalization last month in the setting of electrolyte abnormalities dehydration and ileus. Two week zio patch monitor in process.    GERD - Continue Pantoprazole  40mg  IV once daily    COPD   DM type II      Principal Problem:   abdominal pain with nausea and vomiting and findings concerning for focal mass in ascedning colon Active Problems:   DM2 (diabetes mellitus, type 2) (HCC)   Hyperlipidemia   Gout   Normochromic normocytic anemia   Essential hypertension   Asthma   GERD (gastroesophageal reflux disease)   Depression with anxiety   CKD stage 3b, GFR 30-44 ml/min (HCC)   Appendix disease   Colonic stricture (HCC)     LOS: 2 days   Elida CHRISTELLA Shawl  06/09/2024, 2:06 PM  ----------------------------------------------------  I have taken a history, reviewed the chart and examined the patient. I performed a substantive portion of this encounter, including complete performance of at least one of the key components, in conjunction with the APP. I agree with the APP's note, impression and recommendations  Patient had an episode of emesis last night, but none today.  However she states she is not eating anything today.  When I saw her, she was eating some broth.  Pain is about the same.  She reports passing flatus but no stool today. Will try for bowel prep this evening.  Encourage patient to go at her own pace.  Starting the prep this afternoon with plans for an afternoon procedure tomorrow hopefully will give her enough time to drink enough to gently clear her colon for tomorrow. If patient unable to tolerate prep, surgery will likely proceed with resection without colonoscopy.  Etiology of patient's presentation unclear, but  infectious, IBD and neoplastic causes all within differential.  Eamonn Sermeno E. Stacia, MD [facility]  Moderate complex medical decision making (this includes chart review, review of results, face-to-face time used for counseling as well as treatment plan and follow-up. The patient was provided an opportunity to ask questions and all were answered. The patient agreed with the plan and demonstrated an understanding of the instructions

## 2024-06-10 ENCOUNTER — Telehealth (HOSPITAL_COMMUNITY): Payer: Self-pay | Admitting: Pharmacy Technician

## 2024-06-10 ENCOUNTER — Other Ambulatory Visit (HOSPITAL_COMMUNITY): Payer: Self-pay

## 2024-06-10 ENCOUNTER — Encounter: Payer: Self-pay | Admitting: *Deleted

## 2024-06-10 ENCOUNTER — Ambulatory Visit: Admitting: Podiatry

## 2024-06-10 DIAGNOSIS — K56699 Other intestinal obstruction unspecified as to partial versus complete obstruction: Secondary | ICD-10-CM | POA: Diagnosis not present

## 2024-06-10 DIAGNOSIS — D649 Anemia, unspecified: Secondary | ICD-10-CM | POA: Diagnosis not present

## 2024-06-10 DIAGNOSIS — K389 Disease of appendix, unspecified: Secondary | ICD-10-CM

## 2024-06-10 DIAGNOSIS — K6389 Other specified diseases of intestine: Secondary | ICD-10-CM | POA: Diagnosis not present

## 2024-06-10 DIAGNOSIS — K529 Noninfective gastroenteritis and colitis, unspecified: Secondary | ICD-10-CM | POA: Diagnosis not present

## 2024-06-10 LAB — CBC
HCT: 26.7 % — ABNORMAL LOW (ref 36.0–46.0)
Hemoglobin: 8.3 g/dL — ABNORMAL LOW (ref 12.0–15.0)
MCH: 26.9 pg (ref 26.0–34.0)
MCHC: 31.1 g/dL (ref 30.0–36.0)
MCV: 86.4 fL (ref 80.0–100.0)
Platelets: 181 K/uL (ref 150–400)
RBC: 3.09 MIL/uL — ABNORMAL LOW (ref 3.87–5.11)
RDW: 18.2 % — ABNORMAL HIGH (ref 11.5–15.5)
WBC: 7.3 K/uL (ref 4.0–10.5)
nRBC: 0 % (ref 0.0–0.2)

## 2024-06-10 LAB — COMPREHENSIVE METABOLIC PANEL WITH GFR
ALT: 9 U/L (ref 0–44)
AST: 14 U/L — ABNORMAL LOW (ref 15–41)
Albumin: 2.2 g/dL — ABNORMAL LOW (ref 3.5–5.0)
Alkaline Phosphatase: 127 U/L — ABNORMAL HIGH (ref 38–126)
Anion gap: 11 (ref 5–15)
BUN: 17 mg/dL (ref 8–23)
CO2: 21 mmol/L — ABNORMAL LOW (ref 22–32)
Calcium: 7.8 mg/dL — ABNORMAL LOW (ref 8.9–10.3)
Chloride: 108 mmol/L (ref 98–111)
Creatinine, Ser: 1.82 mg/dL — ABNORMAL HIGH (ref 0.44–1.00)
GFR, Estimated: 29 mL/min — ABNORMAL LOW (ref >60)
Glucose, Bld: 123 mg/dL — ABNORMAL HIGH (ref 70–99)
Potassium: 3.1 mmol/L — ABNORMAL LOW (ref 3.5–5.1)
Sodium: 140 mmol/L (ref 135–145)
Total Bilirubin: <0.2 mg/dL (ref 0.0–1.2)
Total Protein: 4.4 g/dL — ABNORMAL LOW (ref 6.5–8.1)

## 2024-06-10 LAB — GLUCOSE, CAPILLARY
Glucose-Capillary: 130 mg/dL — ABNORMAL HIGH (ref 70–99)
Glucose-Capillary: 131 mg/dL — ABNORMAL HIGH (ref 70–99)
Glucose-Capillary: 133 mg/dL — ABNORMAL HIGH (ref 70–99)
Glucose-Capillary: 137 mg/dL — ABNORMAL HIGH (ref 70–99)
Glucose-Capillary: 156 mg/dL — ABNORMAL HIGH (ref 70–99)
Glucose-Capillary: 166 mg/dL — ABNORMAL HIGH (ref 70–99)

## 2024-06-10 LAB — PHOSPHORUS: Phosphorus: 1.6 mg/dL — ABNORMAL LOW (ref 2.5–4.6)

## 2024-06-10 LAB — MAGNESIUM: Magnesium: 2.5 mg/dL — ABNORMAL HIGH (ref 1.7–2.4)

## 2024-06-10 MED ORDER — POTASSIUM CHLORIDE 2 MEQ/ML IV SOLN
INTRAVENOUS | Status: AC
  Filled 2024-06-10 (×2): qty 1000

## 2024-06-10 MED ORDER — POTASSIUM CHLORIDE 10 MEQ/100ML IV SOLN
10.0000 meq | INTRAVENOUS | Status: AC
Start: 2024-06-10 — End: 2024-06-10
  Administered 2024-06-10 (×4): 10 meq via INTRAVENOUS
  Filled 2024-06-10 (×4): qty 100

## 2024-06-10 MED ORDER — TRAVASOL 10 % IV SOLN
INTRAVENOUS | Status: AC
  Filled 2024-06-10: qty 480

## 2024-06-10 MED ORDER — POTASSIUM PHOSPHATES 15 MMOLE/5ML IV SOLN
30.0000 mmol | Freq: Once | INTRAVENOUS | Status: AC
  Administered 2024-06-10: 30 mmol via INTRAVENOUS
  Filled 2024-06-10: qty 10

## 2024-06-10 NOTE — Progress Notes (Signed)
 Mobility Specialist - Progress Note   06/10/24 0846  Mobility  Activity Ambulated with assistance;Pivoted/transferred to/from [facility]  Level of Assistance Contact guard assist, steadying assist  Assistive Device BSC  Distance Ambulated (ft) 3 ft  Range of Motion/Exercises Active  Activity Response Tolerated well  Mobility visit 1 Mobility  Mobility Specialist Start Time (ACUTE ONLY) 0830  Mobility Specialist Stop Time (ACUTE ONLY) 0845  Mobility Specialist Time Calculation (min) (ACUTE ONLY) 15 min   Pt was found in bed and requested assistance for BM. Assisted to [facility]. Declined hallway ambulation but agreeable to sit on recliner chair. At EOS was left with all needs met. Call bell in reach.   Erminio Leos,  Mobility Specialist Can be reached via Secure Chat

## 2024-06-10 NOTE — Progress Notes (Addendum)
 PHARMACY - TOTAL PARENTERAL NUTRITION CONSULT NOTE   Indication: Planned SBR  Patient Measurements: Height: 5' 3 (160 cm) Weight: 80.5 kg (177 lb 7.5 oz) IBW/kg (Calculated) : 52.4 TPN AdjBW (KG): 59.4 Body mass index is 31.44 kg/m. Usual Weight:   Assessment: Abdominal pain, NV, unable to keep anything down. Abd pain/nausea/vomiting with concerns of hepatic flexure stricture, terminal ileitis and a liver mass. Dilation of appendix also noted concerning for mild acute appendicitis Will need colonoscopy and possible surgical intervention. Tentatively plan colonoscopy Wed. 11/19  Glucose / Insulin : +DM not currently on any treatment PTA - CBGs 97 (prior to TPN) now 264, 114, 130 after starting TPN last night. 6 units SSI give since starting TPN  Electrolytes: K 3.1 low, Cl 108 down, Bicarb 19>>21 slightly low end (on bicarb tab 1300mg /d). CoCa 9.24, Mg 2.5 high, Phos 1.6 low  Renal: CKD3 with Scr 1.82 up. UOP none noted 11/18 Hepatic: Elevated AlkPhos, otherwise ok Intake / Output; MIVF:  CLD: yesterday with 1 Boost Breeze charted UOP: none noted MIVF: none LBM: 11/18  GI Imaging: 11/16 CT:  Enlarged appendix concerning for mild acute appendicitis.Circumferential narrowing with slight associated increased soft tissue density/enhancement over the ascending colon in the region of the hepatic flexure. This is concerning for a focal mass/neoplasm. Wall thickening of a moderate segment of the distal ileum. Findings may be due to small bowel enteritis of infectious or inflammatory nature.  GI Surgeries / Procedures:  Colonoscopy: 11/19 Surgery: TBD  Central access: PICC 11/18 TPN start date: 11/18  Nutritional Goals: Goal TPN rate is 68mL/hr (provides 84 g of protein and 1700 kcals per day), 19% lipids  RD Assessment: TBD Estimated Needs Total Energy Estimated Needs: 1550-1750 Total Protein Estimated Needs: 75-85g Total Fluid Estimated Needs: 1.7L/day  Current  Nutrition:  Clear liquids Boost Breeze TID ordered (1 charted 11/18)  Plan:  - Con't TPN at 40 mL/hr at 1800 (48g protein, 971 kcal) while refeeding vs needs electrolyte repletion - Electrolytes in TPN: Na 43mEq/L, K 78mEq/L, Ca 50mEq/L, remove Mg 78mEq/L, and Phos 15mmol/L. Cl:Ac 1:1 - Add standard MVI and trace elements to TPN Initiate Sensitive q4h SSI and adjust as needed - Monitor TPN labs on Mon/Thurs, and PRN - K runs 10meq Iv x 4 , Phos 30mmol IV x 1 (another K+) - 11/19: Colonoscopy - 11/19: LR + K+ at 64ml/hr per MD orders x 24  Tymere Depuy Karoline Marina, PharmD, [facility] Clinical Staff Pharmacist Marina Salines Stillinger 06/10/2024,7:35 AM

## 2024-06-10 NOTE — Progress Notes (Signed)
 PROGRESS NOTE    Brooke [name]  FMW:[id number] DOB: 03/19/1951 DOA: 06/07/2024 PCP: Norleen Lynwood ORN, MD    Brief Narrative:   Brooke [name] is a 73 y.o. female with past medical history significant for HTN, HLD, DM2, asthma, anxiety, hypothyroidism, CKD stage IIIb, GERD who presented to [facility] ED on 06/07/2024 from home with complaint of abdominal pain associated with nausea and vomiting.  Poor appetite.  Pain is localized to the epigastric region and rated as a 6/10, sharp and constant.  No radiation.  Food exacerbates symptoms.  Denies diarrhea, no fever/chills.  Denies any blood in her stool.  Further denies chest pain, no palpitations, no shortness of breath, no cough, no urinary symptoms.  Last colonoscopy 12/2019 with diverticulosis of the sigmoid colon, otherwise normal.  No family history of colon cancer.  In the ED, temperature 98.1 F, HR 78, RR 18, BP 152/82, SpO2 99% on room air.  WBC 8.5, hemoglobin 10.3, platelet count 322.  Sodium 141, potassium 4.1, chloride 109, CO2 22, glucose 108, BUN 13, creat 1.61.  Alkaline phosphatase 180.  Lipase less than 10.  AST 27, ALT 12, total bilirubin 0.3.  Procalcitonin 0.33.  Lactic acid 2.0.  Vitamin B12 1004 and 76.  Iron 39, TIBC 207, ferritin 142.  Urinalysis with negative leukocytes negative nitrite, rare bacteria, 0-5 WBCs.  CT abdomen/pelvis with contrast with findings of enlarged appendix measuring 1.3 cm in diameter with enhancement and mild adjacent free fluid in the right lower quadrant concerning for mild acute appendicitis, focal circumferential narrowing with slight associated increased soft tissue density/enhancement over the ascending colon in the region of the hepatic flexure concerning for a focal mass/neoplasm, wall thickening of a moderate segment of distal ileum may be due to small bowel enteritis versus infectious versus inflammatory in nature, mild free peritoneal fluid, several liver cyst unchanged, 2.8 cm hyperdense mass over the  upper pole left kidney unchanged compatible with slightly hyperdense cyst, aortic atherosclerosis.  General surgery was consulted.  TRH consulted for admission for further evaluation and management.  Assessment & Plan:   Ascending colon mass, unspecified Patient presenting with abdominal pain, nausea, vomiting and poor appetite.  Pain localized to the epigastric region.  Colonoscopy 2021 with diverticulosis of the sigmoid colon otherwise unrevealing.  CT abdomen/pelvis on admission with findings enlarged appendix measuring 1.3 cm in diameter with enhancement and mild adjacent free fluid in the right lower quadrant concerning for mild acute appendicitis, focal circumferential narrowing with slight associated increased soft tissue density/enhancement over the ascending colon in the region of the hepatic flexure concerning for a focal mass/neoplasm, wall thickening of a moderate segment of distal ileum may be due to small bowel enteritis versus infectious versus inflammatory in nature, mild free peritoneal fluid -- [facility] following; appreciate assistance -- Clear liquid diet, n.p.o. after midnight -- PICC line placed and started on TPN 11/18 -- Colonoscopy now deferred until tomorrow due to lack of anesthesia availability today -- Zofran  as needed nausea/vomiting -- Morphine  2 mg IV every 2 hours as needed severe pain -- Further per GI/general surgery  Acute appendicitis, ruled out Atypical findings on CT, seen by general surgery and clinically without signs or symptoms of acute appendicitis, suspect area of concern related to above mass and obstructive pathology.  Hypokalemia Potassium 3.1, will replete. -- Repeat electrolytes in a.m.  HTN -- Diltiazem  100 mg p.o. daily -- Metoprolol  succinate 100 mg p.o. daily  HLD -- Crestor  40 mg p.o. daily  DM2 Hemoglobin A1c 5.4. -- Sensitive SSI for coverage -- CBG every 4 hours  CKD stage IIIb Baseline creatinine 1.5-1.9, stable. --On  TPN -- BMP in a.m.  Depression with anxiety -- Celexa  40 mg p.o. daily  Asthma -- Breztri  2 puffs twice daily (substituted for home Trelegy)  GERD -- Protonix  40 mg IV every 24 hours  Obesity, class I Body mass index is 31.44 kg/m.   DVT prophylaxis: SCDs Start: 06/07/24 2000 Place TED hose Start: 06/07/24 2000    Code Status: Full Code Family Communication: Updated multiple family members present at bedside this morning  Disposition Plan:  Level of care: Progressive Status is: Inpatient Remains inpatient appropriate because: Pending colonoscopy, anticipate likely need of surgical intervention given ascending colon mass    Consultants:  [facility] General Surgery  Procedures:  None  Antimicrobials:  None   Subjective: Patient seen examined bedside, sitting in bedside chair.  Currently pain-free.  Asking when her colonoscopy is scheduled for today, unfortunately deferred to tomorrow due to lack of anesthesia availability.  General surgery also following, started on TPN yesterday.  No other specific questions, concerns or complaints at this time.  Denies headache, no visual changes, no chest pain, no palpitations, no shortness of breath, no abdominal pain, no fever/chills/night sweats, no no focal weakness, no fatigue, no paresthesia.  No acute events overnight per nursing staff.  current nausea/vomiting, no diarrhea,   Objective: Vitals:   06/09/24 2114 06/10/24 0522 06/10/24 0938 06/10/24 1347  BP:  135/64 123/65 113/61  Pulse:  (!) 59 64 62  Resp:  18  16  Temp:  97.9 F (36.6 C)  98 F (36.7 C)  TempSrc:  Oral  Oral  SpO2: 94% 97%  97%  Weight:      Height:        Intake/Output Summary (Last 24 hours) at 06/10/2024 1750 Last data filed at 06/10/2024 9047 Gross per 24 hour  Intake 2228.65 ml  Output 180 ml  Net 2048.65 ml   Filed Weights   06/07/24 1855  Weight: 80.5 kg    Examination:  Physical Exam: GEN: NAD, alert and oriented  x 3, obese  HEENT: NCAT, PERRL, EOMI, sclera clear, MMM PULM: CTAB w/o wheezes/crackles, normal respiratory effort, room air CV: RRR w/o M/G/R GI: abd soft, NTND, + BS MSK: no peripheral edema, moves all extremities independently NEURO: No focal neurological deficit PSYCH: normal mood/affect Integumentary: dry/intact, no rashes or wounds    Data Reviewed: I have personally reviewed following labs and imaging studies  CBC: Recent Labs  Lab 06/07/24 1350 06/08/24 0404 06/10/24 0343  WBC 8.5 10.4 7.3  NEUTROABS 7.0  --   --   HGB 10.3* 10.3* 8.3*  HCT 32.2* 32.8* 26.7*  MCV 84.3 86.3 86.4  PLT 322 320 181   Basic Metabolic Panel: Recent Labs  Lab 06/07/24 1350 06/07/24 1842 06/08/24 0404 06/09/24 0413 06/10/24 0343  NA 141  --  144 144 140  K 4.1  --  3.7 3.2* 3.1*  CL 109  --  111 112* 108  CO2 22  --  19* 21* 21*  GLUCOSE 108*  --  104* 91 123*  BUN 13  --  12 12 17   CREATININE 1.61*  --  1.49* 1.47* 1.82*  CALCIUM  8.5*  --  8.5* 8.0* 7.8*  MG  --  1.6* 2.4  --  2.5*  PHOS  --   --   --   --  1.6*  GFR: Estimated Creatinine Clearance: 28.1 mL/min (A) (by C-G formula based on SCr of 1.82 mg/dL (H)). Liver Function Tests: Recent Labs  Lab 06/07/24 1350 06/08/24 0404 06/10/24 0343  AST 27 21 14*  ALT 12 13 9   ALKPHOS 180* 176* 127*  BILITOT 0.3 0.3 <0.2  PROT 5.7* 5.8* 4.4*  ALBUMIN 2.9* 2.7* 2.2*   Recent Labs  Lab 06/07/24 1350  LIPASE <10*   No results for input(s): AMMONIA in the last 168 hours. Coagulation Profile: Recent Labs  Lab 06/08/24 0404  INR 1.2   Cardiac Enzymes: No results for input(s): CKTOTAL, CKMB, CKMBINDEX, TROPONINI in the last 168 hours. BNP (last 3 results) No results for input(s): PROBNP in the last 8760 hours. HbA1C: No results for input(s): HGBA1C in the last 72 hours. CBG: Recent Labs  Lab 06/09/24 2355 06/10/24 0401 06/10/24 0754 06/10/24 1124 06/10/24 1656  GLUCAP 114* 130* 133* 137* 131*    Lipid Profile: No results for input(s): CHOL, HDL, LDLCALC, TRIG, CHOLHDL, LDLDIRECT in the last 72 hours. Thyroid  Function Tests: No results for input(s): TSH, T4TOTAL, FREET4, T3FREE, THYROIDAB in the last 72 hours. Anemia Panel: Recent Labs    06/07/24 1916  VITAMINB12 1,476*  FERRITIN 142  TIBC 207*  IRON 39   Sepsis Labs: Recent Labs  Lab 06/07/24 1841 06/07/24 1916 06/07/24 2100 06/08/24 0404  PROCALCITON 0.33  --   --  0.35  LATICACIDVEN  --  2.0* 1.3  --     No results found for this or any previous visit (from the past 240 hours).       Radiology Studies: US  EKG SITE RITE Result Date: 06/09/2024 If Site Rite image not attached, placement could not be confirmed due to current cardiac rhythm.       Scheduled Meds:  allopurinol   100 mg Oral Daily   atropine  1 drop Left Eye Daily   azelastine   2 spray Each Nare BID   brimonidine   1 drop Left Eye BID   budesonide -glycopyrrolate -formoterol   2 puff Inhalation BID   Chlorhexidine  Gluconate Cloth  6 each Topical Daily   citalopram   40 mg Oral Daily   diltiazem   180 mg Oral Daily   erythromycin   Left Eye QHS   feeding supplement  1 Container Oral TID BM   insulin  aspart  0-9 Units Subcutaneous Q4H   latanoprost   1 drop Left Eye QHS   loratadine  10 mg Oral Daily   metoprolol  succinate  100 mg Oral Daily   pantoprazole  (PROTONIX ) IV  40 mg Intravenous Q24H   rosuvastatin   40 mg Oral Daily   sodium bicarbonate   1,300 mg Oral Daily   Continuous Infusions:  lactated ringers  1,000 mL with potassium chloride  40 mEq infusion 75 mL/hr at 06/10/24 0946   TPN ADULT (ION) 40 mL/hr at 06/09/24 1807   TPN ADULT (ION)       LOS: 3 days    Time spent: 50 minutes spent on 06/10/2024 caring for this patient face-to-face including chart review, ordering labs/tests, documenting, discussion with nursing staff, consultants, updating family and interview/physical exam    Camellia PARAS Quetzali Heinle,  DO [facility] Available via Epic secure chat 7am-7pm After these hours, please refer to coverage provider listed on [email or web address] 06/10/2024, 5:50 PM

## 2024-06-10 NOTE — TOC Initial Note (Incomplete)
 Transition of Care [facility]) - Initial/Assessment Note    Patient Details  Name: Brooke [name] MRN: [id number] Date of Birth: 27-Mar-1951  Transition of [facility] CM/SW Contact:    [name] [name], LCSW Phone Number: 06/10/2024, 2:06 PM  Clinical Narrative:                     Barriers to Discharge: Continued Medical Work up   Patient Goals and CMS Choice            Expected Discharge Plan and Services                                              Prior Living Arrangements/Services                       Activities of Daily Living   ADL Screening (condition at time of admission) Independently performs ADLs?: Yes (appropriate for developmental age) Is the patient deaf or have difficulty hearing?: No Does the patient have difficulty seeing, even when wearing glasses/contacts?: Yes Does the patient have difficulty concentrating, remembering, or making decisions?: No  Permission Sought/Granted                  Emotional Assessment              Admission diagnosis:  Submucosal colonic lesion [K63.9] Colonic mass [K63.89] Abdominal pain, unspecified abdominal location [R10.9] Appendicitis, unspecified appendicitis type [K37] Nausea and vomiting, unspecified vomiting type [R11.2] Patient Active Problem List   Diagnosis Date Noted   Ileitis 06/09/2024   Colonic stricture (HCC) 06/08/2024   abdominal pain with nausea and vomiting and findings concerning for focal mass in ascedning colon 06/07/2024   Appendix disease 06/07/2024   PAT (paroxysmal atrial tachycardia)    Metabolic acidosis 05/17/2024   Hypophosphatemia 05/15/2024   Diarrhea 05/14/2024   Hypomagnesemia 05/13/2024   Renal tubular acidosis 05/13/2024   History of essential hypertension 05/13/2024   Moderate persistent asthma 05/13/2024   Other fatigue 05/12/2024   Heart murmur 05/12/2024   Class 1 obesity due to excess calories with body mass index (BMI) of 31.0 to  31.9 in adult 05/05/2024   Small bowel obstruction (HCC) 05/02/2024   Hypernatremia 05/02/2024   Murmur, cardiac 05/01/2024   Abnormal echocardiogram 05/01/2024   Tachycardia 05/01/2024   Ileus (HCC) 04/28/2024   Abdominal pain 04/28/2024   Abnormal CT of the abdomen 04/28/2024   Nausea & vomiting 04/15/2024   COVID-19 virus infection 04/15/2024   Nausea and vomiting 04/15/2024   Dizziness 04/03/2024   Pneumonia 02/27/2024   UTI (urinary tract infection) 02/27/2024   Pancreatic lesion 02/27/2024   Cerumen impaction 02/27/2024   AKI (acute kidney injury) 02/20/2024   ARF (acute renal failure) 02/18/2024   Scalp pain 06/08/2023   Significant closed head trauma within past 3 months 02/25/2023   Chronic cough 02/25/2023   COPD (chronic obstructive pulmonary disease) (HCC) 09/08/2022   Pseudogout of left wrist 08/08/2022   Generalized weakness 05/18/2022   Left ear impacted cerumen 05/16/2022   Left kidney mass 01/14/2022   Peripheral edema 01/14/2022   Left ear hearing loss 12/05/2021   Nasal crusting 12/04/2021   Left ear pain 08/29/2021   Grief 06/05/2021   Acute shoulder bursitis, left 06/05/2021   Bilateral shoulder pain 03/05/2020   Vitamin D  deficiency 12/01/2019  Allergic rhinitis 06/16/2019   Asthma exacerbation 06/16/2019   Dyspnea 01/27/2019   Primary osteoarthritis of left knee 09/30/2018   CKD stage 3b, GFR 30-44 ml/min (HCC) 07/25/2018   Chronic back pain 07/25/2018   Primary osteoarthritis of right knee 05/05/2018   Osteoarthritis of right knee 04/30/2018   Degenerative arthritis of knee, bilateral 08/07/2017   Depression with anxiety 07/19/2017   Vertigo 01/12/2017   Low back pain 01/12/2017   SOB (shortness of breath) 12/02/2016   Dysuria 09/12/2016   Bilateral knee pain 02/15/2015   Effusion of right knee 01/28/2015   Asthma with exacerbation 11/09/2014   Pronation deformity of both feet 09/29/2014   Greater trochanteric bursitis of left hip  08/31/2014   Degenerative arthritis of left knee 05/18/2014   Ventricular tachycardia, polymorphic (HCC) 01/04/2014   Abnormal MRI of head 12/04/2013   Syncope 11/06/2013   Chest pain 11/06/2013   Headache 11/06/2013   Nausea with vomiting 11/06/2013   Hypokalemia 08/18/2012   Diastolic dysfunction 01/15/2011   Leukocytosis 01/03/2011   Toe pain 12/20/2010   Encounter for well adult exam with abnormal findings 12/19/2010   FATIGUE 09/05/2009   DM2 (diabetes mellitus, type 2) (HCC) 11/29/2008   Gout 08/09/2008   Normochromic normocytic anemia 02/09/2008   GERD (gastroesophageal reflux disease) 02/09/2008   ALOPECIA 12/30/2007   Hyperlipidemia 10/01/2007   Essential hypertension 02/26/2007   History of colonic polyps 02/26/2007   Goiter 09/19/2006   Asthma 09/19/2006   ECZEMA, ATOPIC DERMATITIS 09/19/2006   Insomnia 09/19/2006   PCP:  Norleen Lynwood ORN, MD Pharmacy:   [facility] [phone] - [place], [place] - [address] AT [facility] [address] [place] [address] Phone: [phone] Fax: [phone]  [id number] [place], [place] - [address] AT [facility] [address] [place] [address] Phone: [phone] Fax: [phone]     Social Drivers of Health (SDOH) Social History: SDOH Screenings   Food Insecurity: No Food Insecurity (05/18/2024)  Housing: Unknown (05/18/2024)  Transportation Needs: No Transportation Needs (05/18/2024)  Utilities: Not At Risk (05/18/2024)  Alcohol Screen: Low Risk  (09/24/2023)  Depression (PHQ2-9): Low Risk  (06/02/2024)  Financial Resource Strain: Low Risk  (09/24/2023)  Physical Activity: Insufficiently Active (09/24/2023)  Social Connections: Moderately Isolated (05/13/2024)  Stress: No Stress Concern Present (09/24/2023)  Tobacco Use: Low Risk  (06/07/2024)  Health Literacy: Adequate Health Literacy (09/24/2023)   SDOH Interventions:     Readmission Risk  Interventions    05/14/2024   12:41 PM 05/05/2024   11:28 AM 04/16/2024    9:45 AM  Readmission Risk Prevention Plan  Transportation Screening Complete Complete Complete  PCP or Specialist Appt within 3-5 Days   Complete  HRI or Home Care Consult  Complete Complete  Social Work Consult for Recovery Care Planning/Counseling  Complete Complete  Palliative Care Screening  Not Applicable Not Applicable  Medication Review Oceanographer) Complete Complete Complete  HRI or Home Care Consult Complete    SW Recovery Care/Counseling Consult Complete    Palliative Care Screening Not Applicable    Skilled Nursing Facility Not Applicable

## 2024-06-10 NOTE — Progress Notes (Signed)
 [facility] ROUNDING NOTE   Subjective: Patient was able to complete bowel prep.  She did have 1 episode of emesis last night, but otherwise kept all her bowel prep down.  She states that her stool is looking yellow.  Abdominal pain about the same, not severe. Hemoglobin dropped 2 points, but patient denied any blood in the stool.   Objective: Vital signs in last 24 hours: Temp:  [97.8 F (36.6 C)-98 F (36.7 C)] 98 F (36.7 C) (11/19 1347) Pulse Rate:  [59-85] 62 (11/19 1347) Resp:  [16-18] 16 (11/19 1347) BP: (113-137)/(61-83) 113/61 (11/19 1347) SpO2:  [94 %-100 %] 97 % (11/19 1347) Last BM Date : 06/10/24 General: NAD, pleasant African-American female Lungs:  CTA b/l, no w/r/r Heart:  RRR, no m/r/g Abdomen:  Soft, mild tenderness to palpation, more so in the left lower quadrant without rigidity or guarding, ND, +BS Ext:  No c/c/e    Intake/Output from previous day: 11/18 0701 - 11/19 0700 In: 2648.7 [P.O.:1440; I.V.:408.7; IV Piggyback:800] Out: 180 [Emesis/NG output:180] Intake/Output this shift: Total I/O In: 60 [P.O.:60] Out: -    Lab Results: Recent Labs    06/08/24 0404 06/10/24 0343  WBC 10.4 7.3  HGB 10.3* 8.3*  PLT 320 181  MCV 86.3 86.4   BMET Recent Labs    06/08/24 0404 06/09/24 0413 06/10/24 0343  NA 144 144 140  K 3.7 3.2* 3.1*  CL 111 112* 108  CO2 19* 21* 21*  GLUCOSE 104* 91 123*  BUN 12 12 17   CREATININE 1.49* 1.47* 1.82*  CALCIUM  8.5* 8.0* 7.8*   LFT Recent Labs    06/08/24 0404 06/10/24 0343  PROT 5.8* 4.4*  ALBUMIN 2.7* 2.2*  AST 21 14*  ALT 13 9  ALKPHOS 176* 127*  BILITOT 0.3 <0.2   PT/INR Recent Labs    06/08/24 0404  INR 1.2      Imaging/Other results: US  EKG SITE RITE Result Date: 06/09/2024 If Site Rite image not attached, placement could not be confirmed due to current cardiac rhythm.     Assessment and Plan:  73 year old female admitted 06/07/2024 with recurrent N/V and upper  abdominal pain.  CTAP with contrast 06/07/2024 identified an enlarged appendix measuring 1.3 cm in diameter with mucosal enhancement and mild adjacent free fluid in the RLQ concerning for acute appendicitis, focal circumferential narrowing with slight associated increased soft tissue density/enhancement over the ascending colon in the region of the hepatic flexure concerning for possible focal mass/stricture, wall thickening to the distal ileum and liver cysts.  Patient will most likely require surgical resection during this admission due to colonic narrowing of unclear etiology.  CRP 1.1.  Sed rate 10.   - Colonoscopy planned today was canceled due to the lack of anesthesia availability in the late afternoon - Colonoscopy has been rescheduled for 1240 tomorrow - Patient may need an extra dose of Suprep early in the morning depending on stool color - Resume clear liquid diet today - Defer IV fluids and pain management to the hospitalist - Ondansetron  4mg  po or IV Q 6 hrs PRN - Appreciate general surgery's recommendations    Chronic normocytic anemia. No overt GI bleeding.  - Hemoglobin 8.3 down from 10.3 yesterday without overt bleeding   Hypokalemia.  - Management per the hospitalist   CKD stage IIIb, Cr 1.82   PAT/PVC. Had nonsustained atrial tachycardia during her hospitalization last month in the setting of electrolyte abnormalities dehydration and ileus. Two week zio patch  monitor in process.    GERD - Continue Pantoprazole  40mg  IV once daily    COPD   DM type II    Glendia FORBES Holt, MD  06/10/2024, 2:46 PM [facility]

## 2024-06-10 NOTE — H&P (View-Only) (Signed)
 [facility] ROUNDING NOTE   Subjective: Patient was able to complete bowel prep.  She did have 1 episode of emesis last night, but otherwise kept all her bowel prep down.  She states that her stool is looking yellow.  Abdominal pain about the same, not severe. Hemoglobin dropped 2 points, but patient denied any blood in the stool.   Objective: Vital signs in last 24 hours: Temp:  [97.8 F (36.6 C)-98 F (36.7 C)] 98 F (36.7 C) (11/19 1347) Pulse Rate:  [59-85] 62 (11/19 1347) Resp:  [16-18] 16 (11/19 1347) BP: (113-137)/(61-83) 113/61 (11/19 1347) SpO2:  [94 %-100 %] 97 % (11/19 1347) Last BM Date : 06/10/24 General: NAD, pleasant African-American female Lungs:  CTA b/l, no w/r/r Heart:  RRR, no m/r/g Abdomen:  Soft, mild tenderness to palpation, more so in the left lower quadrant without rigidity or guarding, ND, +BS Ext:  No c/c/e    Intake/Output from previous day: 11/18 0701 - 11/19 0700 In: 2648.7 [P.O.:1440; I.V.:408.7; IV Piggyback:800] Out: 180 [Emesis/NG output:180] Intake/Output this shift: Total I/O In: 60 [P.O.:60] Out: -    Lab Results: Recent Labs    06/08/24 0404 06/10/24 0343  WBC 10.4 7.3  HGB 10.3* 8.3*  PLT 320 181  MCV 86.3 86.4   BMET Recent Labs    06/08/24 0404 06/09/24 0413 06/10/24 0343  NA 144 144 140  K 3.7 3.2* 3.1*  CL 111 112* 108  CO2 19* 21* 21*  GLUCOSE 104* 91 123*  BUN 12 12 17   CREATININE 1.49* 1.47* 1.82*  CALCIUM  8.5* 8.0* 7.8*   LFT Recent Labs    06/08/24 0404 06/10/24 0343  PROT 5.8* 4.4*  ALBUMIN 2.7* 2.2*  AST 21 14*  ALT 13 9  ALKPHOS 176* 127*  BILITOT 0.3 <0.2   PT/INR Recent Labs    06/08/24 0404  INR 1.2      Imaging/Other results: US  EKG SITE RITE Result Date: 06/09/2024 If Site Rite image not attached, placement could not be confirmed due to current cardiac rhythm.     Assessment and Plan:  73 year old female admitted 06/07/2024 with recurrent N/V and upper  abdominal pain.  CTAP with contrast 06/07/2024 identified an enlarged appendix measuring 1.3 cm in diameter with mucosal enhancement and mild adjacent free fluid in the RLQ concerning for acute appendicitis, focal circumferential narrowing with slight associated increased soft tissue density/enhancement over the ascending colon in the region of the hepatic flexure concerning for possible focal mass/stricture, wall thickening to the distal ileum and liver cysts.  Patient will most likely require surgical resection during this admission due to colonic narrowing of unclear etiology.  CRP 1.1.  Sed rate 10.   - Colonoscopy planned today was canceled due to the lack of anesthesia availability in the late afternoon - Colonoscopy has been rescheduled for 1240 tomorrow - Patient may need an extra dose of Suprep early in the morning depending on stool color - Resume clear liquid diet today - Defer IV fluids and pain management to the hospitalist - Ondansetron  4mg  po or IV Q 6 hrs PRN - Appreciate general surgery's recommendations    Chronic normocytic anemia. No overt GI bleeding.  - Hemoglobin 8.3 down from 10.3 yesterday without overt bleeding   Hypokalemia.  - Management per the hospitalist   CKD stage IIIb, Cr 1.82   PAT/PVC. Had nonsustained atrial tachycardia during her hospitalization last month in the setting of electrolyte abnormalities dehydration and ileus. Two week zio patch  monitor in process.    GERD - Continue Pantoprazole  40mg  IV once daily    COPD   DM type II    Glendia FORBES Holt, MD  06/10/2024, 2:46 PM [facility]

## 2024-06-10 NOTE — Plan of Care (Signed)

## 2024-06-10 NOTE — Progress Notes (Signed)
 Progress Note     Subjective: Patient's son is at bedside.  She reports continued generalized abdominal pain that is similar in severity. Feels that she mostly tolerated prep. There is some emesis recorded of . She denies any episodes of nausea and vomiting this morning. She has had Bms and flatulence. Denies nausea currently.   ROS  All negative with the exception of above.  Objective: Vital signs in last 24 hours: Temp:  [97.8 F (36.6 C)-98.2 F (36.8 C)] 97.9 F (36.6 C) (11/19 0522) Pulse Rate:  [59-85] 64 (11/19 0938) Resp:  [18] 18 (11/19 0522) BP: (123-158)/(64-85) 123/65 (11/19 0938) SpO2:  [94 %-100 %] 97 % (11/19 0522) Last BM Date : 06/09/24  Intake/Output from previous day: 11/18 0701 - 11/19 0700 In: 2648.7 [P.O.:1440; I.V.:408.7; IV Piggyback:800] Out: 180 [Emesis/NG output:180] Intake/Output this shift: Total I/O In: 60 [P.O.:60] Out: -   PE: General: Pleasant female who is laying in bed in NAD. HEENT: Head is normocephalic, atraumatic. Heart: HR normal during encounter.  Lungs: Respiratory effort nonlabored. Abd: Soft with some distention. Generalized tenderness to palpation. No guarding or rebound tenderness.  MS: Able to move extremities.  Skin: Warm and dry. Psych: Speech normal and responds appropriately.   Lab Results:  Recent Labs    06/08/24 0404 06/10/24 0343  WBC 10.4 7.3  HGB 10.3* 8.3*  HCT 32.8* 26.7*  PLT 320 181   BMET Recent Labs    06/09/24 0413 06/10/24 0343  NA 144 140  K 3.2* 3.1*  CL 112* 108  CO2 21* 21*  GLUCOSE 91 123*  BUN 12 17  CREATININE 1.47* 1.82*  CALCIUM  8.0* 7.8*   PT/INR Recent Labs    06/08/24 0404  LABPROT 15.4*  INR 1.2   CMP     Component Value Date/Time   NA 140 06/10/2024 0343   NA 143 06/01/2024 0958   K 3.1 (L) 06/10/2024 0343   CL 108 06/10/2024 0343   CO2 21 (L) 06/10/2024 0343   GLUCOSE 123 (H) 06/10/2024 0343   GLUCOSE 96 05/27/2006 1052   BUN 17 06/10/2024 0343    BUN 16 06/01/2024 0958   CREATININE 1.82 (H) 06/10/2024 0343   CREATININE 0.90 01/01/2014 1046   CALCIUM  7.8 (L) 06/10/2024 0343   PROT 4.4 (L) 06/10/2024 0343   PROT 4.8 (L) 06/01/2024 0958   ALBUMIN  2.2 (L) 06/10/2024 0343   ALBUMIN  2.5 (L) 06/01/2024 0958   AST 14 (L) 06/10/2024 0343   ALT 9 06/10/2024 0343   ALKPHOS 127 (H) 06/10/2024 0343   BILITOT <0.2 06/10/2024 0343   BILITOT 0.2 06/01/2024 0958   GFRNONAA 29 (L) 06/10/2024 0343   GFRAA 39 (L) 09/24/2018 1054   Lipase     Component Value Date/Time   LIPASE <10 (L) 06/07/2024 1350       Studies/Results: US  EKG SITE RITE Result Date: 06/09/2024 If Site Rite image not attached, placement could not be confirmed due to current cardiac rhythm.   Anti-infectives: Anti-infectives (From admission, onward)    None        Assessment/Plan 73 year old femal having abd pain/nausea/vomiting with concerns of hepatic flexure stricture, terminal ileitis and a liver mass. Dilation of appendix also noted.  -CT showed enlarged appendix measuring 1.3 cm in diameter with mucosal enhancement and mild adjacent free fluid in the right lower quadrant. Findings are concerning for mild acute appendicitis. Focal circumferential narrowing with slight associated increased soft tissue density/enhancement over the ascending colon in the  region of the hepatic flexure. This is concerning for a focal mass/neoplasm. Consider colonoscopy for further evaluation. Wall thickening of a moderate segment of the distal ileum. Findings may be due to small bowel enteritis of infectious or inflammatory nature. Mild free peritoneal fluid. Several liver cysts unchanged. 2.8 cm hypodense mass over the upper pole left kidney unchanged compatible with slightly hyperdense cyst. Aortic atherosclerosis. -Afebrile. -Generalized abdominal pain on exam. Having bowel function. Having some nausea. Had some emesis recorded.  -PICC/TPN initiated 11/18. -GI consulted.  Planning for gradual bowel prep with plans for colonoscopy today to help evaluate for inflammatory bowel process vs infectious. -Will likely need surgical intervention during this admission.  -Will continue to follow.     FEN: NPO; IVF per primary team for electrolyte abnormalities VTE: None currently ID: None currently    LOS: 3 days   I reviewed consulting provider notes, specialist notes, hospitalist notes, nursing notes, last 24 h vitals and pain scores, last 48 h intake and output, last 24 h labs and trends, and last 24 h imaging results.  This care required moderate level of medical decision making.    [name], [facility] 06/10/2024, 10:41 AM Please see Amion for pager number during day hours 7:00am-4:30pm

## 2024-06-10 NOTE — Telephone Encounter (Signed)
 Patient Product/process Development Scientist completed.    The patient is insured through [facility]. Patient has Medicare and is not eligible for a copay card, but may be able to apply for patient assistance or Medicare RX Payment Plan (Patient Must reach out to their plan, if eligible for payment plan), if available.    Ran test claim for Breztri  and the current 30 day co-pay is $0.00.   This test claim was processed through [facility]- copay amounts may vary at other pharmacies due to pharmacy/plan contracts, or as the patient moves through the different stages of their insurance plan.     Brooke [name], CPHT Pharmacy Technician Patient Advocate Specialist Lead [facility] Patient Advocate Team Direct Number: [phone]  Fax: [phone]

## 2024-06-11 ENCOUNTER — Inpatient Hospital Stay (HOSPITAL_COMMUNITY): Admitting: Anesthesiology

## 2024-06-11 ENCOUNTER — Telehealth: Admitting: *Deleted

## 2024-06-11 ENCOUNTER — Encounter (HOSPITAL_COMMUNITY)
Admission: EM | Disposition: A | Discharge status: Home / Self Care | Payer: Self-pay | Source: Home / Self Care | Attending: Internal Medicine

## 2024-06-11 ENCOUNTER — Encounter (HOSPITAL_COMMUNITY): Payer: Self-pay | Admitting: Family Medicine

## 2024-06-11 DIAGNOSIS — D124 Benign neoplasm of descending colon: Secondary | ICD-10-CM

## 2024-06-11 DIAGNOSIS — J449 Chronic obstructive pulmonary disease, unspecified: Secondary | ICD-10-CM

## 2024-06-11 DIAGNOSIS — K5730 Diverticulosis of large intestine without perforation or abscess without bleeding: Secondary | ICD-10-CM

## 2024-06-11 DIAGNOSIS — K6389 Other specified diseases of intestine: Secondary | ICD-10-CM | POA: Diagnosis not present

## 2024-06-11 DIAGNOSIS — I10 Essential (primary) hypertension: Secondary | ICD-10-CM | POA: Diagnosis not present

## 2024-06-11 DIAGNOSIS — K56699 Other intestinal obstruction unspecified as to partial versus complete obstruction: Secondary | ICD-10-CM | POA: Diagnosis not present

## 2024-06-11 DIAGNOSIS — C183 Malignant neoplasm of hepatic flexure: Secondary | ICD-10-CM

## 2024-06-11 DIAGNOSIS — K644 Residual hemorrhoidal skin tags: Secondary | ICD-10-CM

## 2024-06-11 HISTORY — PX: COLONOSCOPY: SHX5424

## 2024-06-11 LAB — CBC
HCT: 25.4 % — ABNORMAL LOW (ref 36.0–46.0)
Hemoglobin: 8.2 g/dL — ABNORMAL LOW (ref 12.0–15.0)
MCH: 27.5 pg (ref 26.0–34.0)
MCHC: 32.3 g/dL (ref 30.0–36.0)
MCV: 85.2 fL (ref 80.0–100.0)
Platelets: 172 K/uL (ref 150–400)
RBC: 2.98 MIL/uL — ABNORMAL LOW (ref 3.87–5.11)
RDW: 18.3 % — ABNORMAL HIGH (ref 11.5–15.5)
WBC: 5.1 K/uL (ref 4.0–10.5)
nRBC: 0 % (ref 0.0–0.2)

## 2024-06-11 LAB — GLUCOSE, CAPILLARY
Glucose-Capillary: 102 mg/dL — ABNORMAL HIGH (ref 70–99)
Glucose-Capillary: 113 mg/dL — ABNORMAL HIGH (ref 70–99)
Glucose-Capillary: 123 mg/dL — ABNORMAL HIGH (ref 70–99)
Glucose-Capillary: 157 mg/dL — ABNORMAL HIGH (ref 70–99)
Glucose-Capillary: 166 mg/dL — ABNORMAL HIGH (ref 70–99)

## 2024-06-11 LAB — COMPREHENSIVE METABOLIC PANEL WITH GFR
ALT: 9 U/L (ref 0–44)
AST: 13 U/L — ABNORMAL LOW (ref 15–41)
Albumin: 2.2 g/dL — ABNORMAL LOW (ref 3.5–5.0)
Alkaline Phosphatase: 115 U/L (ref 38–126)
Anion gap: 9 (ref 5–15)
BUN: 17 mg/dL (ref 8–23)
CO2: 21 mmol/L — ABNORMAL LOW (ref 22–32)
Calcium: 7.7 mg/dL — ABNORMAL LOW (ref 8.9–10.3)
Chloride: 108 mmol/L (ref 98–111)
Creatinine, Ser: 1.64 mg/dL — ABNORMAL HIGH (ref 0.44–1.00)
GFR, Estimated: 33 mL/min — ABNORMAL LOW (ref >60)
Glucose, Bld: 97 mg/dL (ref 70–99)
Potassium: 3.7 mmol/L (ref 3.5–5.1)
Sodium: 138 mmol/L (ref 135–145)
Total Bilirubin: <0.2 mg/dL (ref 0.0–1.2)
Total Protein: 4.5 g/dL — ABNORMAL LOW (ref 6.5–8.1)

## 2024-06-11 LAB — PHOSPHORUS: Phosphorus: 2.7 mg/dL (ref 2.5–4.6)

## 2024-06-11 LAB — MAGNESIUM: Magnesium: 2 mg/dL (ref 1.7–2.4)

## 2024-06-11 SURGERY — COLONOSCOPY
Anesthesia: Monitor Anesthesia Care

## 2024-06-11 MED ORDER — SODIUM CHLORIDE 0.9 % IV SOLN
INTRAVENOUS | Status: AC | PRN
  Administered 2024-06-11: 500 mL via INTRAVENOUS

## 2024-06-11 MED ORDER — PROPOFOL 500 MG/50ML IV EMUL
INTRAVENOUS | Status: DC | PRN
Start: 2024-06-11 — End: 2024-06-11
  Administered 2024-06-11: 175 ug/kg/min via INTRAVENOUS

## 2024-06-11 MED ORDER — ONDANSETRON HCL 4 MG/2ML IJ SOLN
4.0000 mg | Freq: Once | INTRAMUSCULAR | Status: AC
  Administered 2024-06-11: 4 mg via INTRAVENOUS

## 2024-06-11 MED ORDER — TRAVASOL 10 % IV SOLN
INTRAVENOUS | Status: AC
  Filled 2024-06-11: qty 840

## 2024-06-11 MED ORDER — NA SULFATE-K SULFATE-MG SULF 17.5-3.13-1.6 GM/177ML PO SOLN
1.0000 | Freq: Once | ORAL | Status: AC
  Administered 2024-06-11: 354 mL via ORAL
  Filled 2024-06-11: qty 1

## 2024-06-11 MED ORDER — POTASSIUM CHLORIDE 2 MEQ/ML IV SOLN
INTRAVENOUS | Status: AC
  Filled 2024-06-11: qty 1000

## 2024-06-11 NOTE — Progress Notes (Signed)
 PHARMACY - TOTAL PARENTERAL NUTRITION CONSULT NOTE   Indication: Planned SBR  Patient Measurements: Height: 5' 3 (160 cm) Weight: 80.5 kg (177 lb 7.5 oz) IBW/kg (Calculated) : 52.4 TPN AdjBW (KG): 59.4 Body mass index is 31.44 kg/m. Usual Weight:   Assessment: Abdominal pain, NV, unable to keep anything down; concerns for hepatic flexure stricture, terminal ileitis and a liver mass. Dilation of appendix also noted concerning for mild acute appendicitis Will need colonoscopy and possible surgical intervention. Tentatively plan colonoscopy Thursday, 11/20. Pharmacy consulted for TPN management.  Glucose / Insulin : +DM not currently on any treatment PTA - CBGs 97-166 in last 24hrs - 7 units of insulin  used in the last 24hrs Electrolytes: Bicarb 21 slightly low end (on bicarb tab 1300mg /d). All others WNL, including CoCa 9 Renal: CKD3 with Scr 1.64 improved.  Hepatic: albumin 2.2 low, otherwise okay Intake / Output; MIVF: UOP: 3x occurrence in last 24hrs Stool: 4x occurrence in last 24hrs MIVF: LR/KCl @75mL /hr  GI Imaging: 11/16 CT:  Enlarged appendix concerning for mild acute appendicitis.Circumferential narrowing with slight associated increased soft tissue density/enhancement over the ascending colon in the region of the hepatic flexure. This is concerning for a focal mass/neoplasm. Wall thickening of a moderate segment of the distal ileum. Findings may be due to small bowel enteritis of infectious or inflammatory nature. GI Surgeries / Procedures:  Colonoscopy: 11/20 Surgery: TBD  Central access: PICC 11/18 TPN start date: 11/18  Nutritional Goals: Goal TPN rate is 63mL/hr (provides 84 g of protein and 1700 kcals per day), 19% lipids  RD Assessment: TBD Estimated Needs Total Energy Estimated Needs: 1550-1750 Total Protein Estimated Needs: 75-85g Total Fluid Estimated Needs: 1.7L/day  Current Nutrition:  NPO and TPN Received 3x Boost Breeze 11/19  Plan:  - Increase  TPN to goal rate of 70 mL/hr at 1800 - Electrolytes in TPN:  Na 77mEq/L K 50mEq/L Ca 65mEq/L Mg 61mEq/L - increased Phos 15mmol/L Cl:Ac 1:2 - Add standard MVI and trace elements to TPN - Continue Sensitive q4h SSI and adjust as needed - Monitor TPN labs on Mon/Thurs, and PRN - LR + K+ at 74ml/hr per MD orders x 24hrs (order ends today at 0859) - 11/20: Colonoscopy   Lacinda Moats, PharmD Clinical Pharmacist  11/20/20257:06 AM

## 2024-06-11 NOTE — Progress Notes (Signed)
 [facility] Progress Note  CC:  N/V, abdominal pain, suspected stricture at the hepatic flexure     Subjective: Patient endorsed passing a small amount of thick brown stool in her undergarment/bedpan earlier this morning.  Nausea is well-controlled.  No vomiting.  No abdominal pain.  No chest pain or shortness of breath.   Objective:  Vital signs in last 24 hours: Temp:  [98 F (36.7 C)-98.7 F (37.1 C)] 98.7 F (37.1 C) (11/20 0426) Pulse Rate:  [61-65] 65 (11/20 0426) Resp:  [16-20] 20 (11/20 0426) BP: (106-123)/(51-65) 116/54 (11/20 0426) SpO2:  [96 %-98 %] 96 % (11/20 0426) Last BM Date : 06/10/24 General: Alert 73 year old female in no acute distress. Heart: Regular rate and rhythm, soft systolic murmur. Pulm: Breath sounds clear throughout.  On room air. Abdomen: Soft, nondistended.  Nontender.  Positive bowel sounds to all 4 quadrants. Extremities: No lower extremity edema. Neurologic:  Alert and oriented x 4.  Speech is clear.  Moves all extremities equally. Psych:  Alert and cooperative. Normal mood and affect.  Intake/Output from previous day: 11/19 0701 - 11/20 0700 In: 454.5 [P.O.:60; I.V.:394.5] Out: -  Intake/Output this shift: No intake/output data recorded.  Lab Results: Recent Labs    06/10/24 0343 06/11/24 0407  WBC 7.3 5.1  HGB 8.3* 8.2*  HCT 26.7* 25.4*  PLT 181 172   BMET Recent Labs    06/09/24 0413 06/10/24 0343 06/11/24 0407  NA 144 140 138  K 3.2* 3.1* 3.7  CL 112* 108 108  CO2 21* 21* 21*  GLUCOSE 91 123* 97  BUN 12 17 17   CREATININE 1.47* 1.82* 1.64*  CALCIUM  8.0* 7.8* 7.7*   LFT Recent Labs    06/11/24 0407  PROT 4.5*  ALBUMIN 2.2*  AST 13*  ALT 9  ALKPHOS 115  BILITOT <0.2   PT/INR No results for input(s): LABPROT, INR in the last 72 hours. Hepatitis Panel No results for input(s): HEPBSAG, HCVAB, HEPAIGM, HEPBIGM in the last 72 hours.  US  EKG SITE RITE Result Date: 06/09/2024 If  Site Rite image not attached, placement could not be confirmed due to current cardiac rhythm.  Patient Profile: Brooke [name] is a 73 y.o. female with a past medical history of hypertension, aortic atherosclerosis, polymorphic VT, renal stenosis s/p right renal stent, DM type II, CKD stage III, COPD, gout, glaucoma, liver cysts, GERD and colon polyp (neurofibroma).  Past cholecystectomy. Admitted 06/07/2024 with left upper abdominal pain x 1 day with nausea and vomited x 2.     Assessment / Plan:  73 year old female admitted 06/07/2024 with recurrent N/V and upper abdominal pain. Alk phos 180 with normal T. Bili, AST and ALT levels. Lipase  < 10. CTAP with contrast 06/07/2024 identified an enlarged appendix measuring 1.3 cm in diameter with mucosal enhancement and mild adjacent free fluid in the RLQ concerning for acute appendicitis, focal circumferential narrowing with slight associated increased soft tissue density/enhancement over the ascending colon in the region of the hepatic flexure concerning for possible focal mass/stricture, wall thickening to the distal ileum and liver cysts. Patient was seen by general surgeon Dr. Bernarda Ned who reviewed CTAP images 11/16 and assessed the patient had a stricture at the hepatic flexure causing dilatation of the cecum and appendix without concern for acute appendicitis. Patient will most likely require colon resection during this admission.  CRP 1.1.  Sed rate 10.  Started on TPN 11/8. Patient completed bowel prep 11/18 - 11/19,  however, colonoscopy was not performed 11/19 as there was not present sufficient time available in endo. Colonoscopy rescheduled for this afternoon. Patient passed a small amount of thick dark brown stool early am. No bloody or black stools. Hemodynamically stable.  - Suprep dose x 1 now then NPO by 10 AM - Ondansetron  4mg  IV x 1 prior to Suprep  - Proceed with colonoscopy today 1pm to rule out colon malignancy and IBD/Crohn's  disease, benefits and risks discussed including risk with sedation, risk of bleeding, perforation and infection  - Appreciate general surgery's recommendations   Chronic normocytic anemia. Hg 10.3 -> 8.3 -> today Hg 8.2. No overt GI bleeding.    Hypokalemia, resolved. K+ today 3.7.    CKD stage IIIb, Cr 1.49 -> 1.47 -> 1.64. - IV fluids per the hospitalist    PAT/PVC. Had nonsustained atrial tachycardia during her hospitalization last month in the setting of electrolyte abnormalities dehydration and ileus. Two week zio patch monitor in process.    GERD - Continue Pantoprazole  40mg  IV once daily    COPD   DM type II          Principal Problem:   abdominal pain with nausea and vomiting and findings concerning for focal mass in ascedning colon Active Problems:   DM2 (diabetes mellitus, type 2) (HCC)   Hyperlipidemia   Gout   Normochromic normocytic anemia   Essential hypertension   Asthma   GERD (gastroesophageal reflux disease)   Depression with anxiety   CKD stage 3b, GFR 30-44 ml/min (HCC)   Appendix disease   Colonic stricture (HCC)   Ileitis     LOS: 4 days   Elida CHRISTELLA Shawl  06/11/2024, 7:57 AM

## 2024-06-11 NOTE — Interval H&P Note (Signed)
 History and Physical Interval Note:  06/11/2024 12:58 PM  Brooke [name]  has presented today for surgery, with the diagnosis of Colon stricture.  The various methods of treatment have been discussed with the patient and family. After consideration of risks, benefits and other options for treatment, the patient has consented to  Procedure(s): COLONOSCOPY (N/A) as a surgical intervention.  The patient's history has been reviewed, patient examined, no change in status, stable for surgery.  I have reviewed the patient's chart and labs.  Questions were answered to the patient's satisfaction.     Brooke [name]

## 2024-06-11 NOTE — Progress Notes (Signed)
 Progress Note     Subjective: Had a bowel movement this morning. Reports flatulence. Denies nausea and vomiting. Denies pain.   ROS  All negative with the exception of above.  Objective: Vital signs in last 24 hours: Temp:  [98 F (36.7 C)-98.7 F (37.1 C)] 98.7 F (37.1 C) (11/20 0426) Pulse Rate:  [61-65] 65 (11/20 0426) Resp:  [16-20] 20 (11/20 0426) BP: (106-123)/(51-65) 116/54 (11/20 0426) SpO2:  [93 %-98 %] 93 % (11/20 0841) Last BM Date : 06/11/24  Intake/Output from previous day: 11/19 0701 - 11/20 0700 In: 454.5 [P.O.:60; I.V.:394.5] Out: -  Intake/Output this shift: No intake/output data recorded.  PE: General: Pleasant female who is laying in bed in NAD. HEENT: Head is normocephalic, atraumatic. Heart: HR normal during encounter.  Lungs: Respiratory effort nonlabored. Abd: Soft with some distention. NT. No guarding or rebound tenderness. +BS MS: Able to move extremities.  Skin: Warm and dry. Psych: Speech normal and responds appropriately.   Lab Results:  Recent Labs    06/10/24 0343 06/11/24 0407  WBC 7.3 5.1  HGB 8.3* 8.2*  HCT 26.7* 25.4*  PLT 181 172   BMET Recent Labs    06/10/24 0343 06/11/24 0407  NA 140 138  K 3.1* 3.7  CL 108 108  CO2 21* 21*  GLUCOSE 123* 97  BUN 17 17  CREATININE 1.82* 1.64*  CALCIUM  7.8* 7.7*   PT/INR No results for input(s): LABPROT, INR in the last 72 hours. CMP     Component Value Date/Time   NA 138 06/11/2024 0407   NA 143 06/01/2024 0958   K 3.7 06/11/2024 0407   CL 108 06/11/2024 0407   CO2 21 (L) 06/11/2024 0407   GLUCOSE 97 06/11/2024 0407   GLUCOSE 96 05/27/2006 1052   BUN 17 06/11/2024 0407   BUN 16 06/01/2024 0958   CREATININE 1.64 (H) 06/11/2024 0407   CREATININE 0.90 01/01/2014 1046   CALCIUM  7.7 (L) 06/11/2024 0407   PROT 4.5 (L) 06/11/2024 0407   PROT 4.8 (L) 06/01/2024 0958   ALBUMIN 2.2 (L) 06/11/2024 0407   ALBUMIN 2.5 (L) 06/01/2024 0958   AST 13 (L) 06/11/2024 0407    ALT 9 06/11/2024 0407   ALKPHOS 115 06/11/2024 0407   BILITOT <0.2 06/11/2024 0407   BILITOT 0.2 06/01/2024 0958   GFRNONAA 33 (L) 06/11/2024 0407   GFRAA 39 (L) 09/24/2018 1054   Lipase     Component Value Date/Time   LIPASE <10 (L) 06/07/2024 1350       Studies/Results: No results found.  Anti-infectives: Anti-infectives (From admission, onward)    None        Assessment/Plan 73 year old femal having abd pain/nausea/vomiting with concerns of hepatic flexure stricture, terminal ileitis and a liver cyst. Dilation of appendix also noted.  -CT showed enlarged appendix measuring 1.3 cm in diameter with mucosal enhancement and mild adjacent free fluid in the right lower quadrant. Findings are concerning for mild acute appendicitis. Focal circumferential narrowing with slight associated increased soft tissue density/enhancement over the ascending colon in the region of the hepatic flexure. This is concerning for a focal mass/neoplasm. Consider colonoscopy for further evaluation. Wall thickening of a moderate segment of the distal ileum. Findings may be due to small bowel enteritis of infectious or inflammatory nature. Mild free peritoneal fluid. Several liver cysts unchanged. 2.8 cm hypodense mass over the upper pole left kidney unchanged compatible with slightly hyperdense cyst. Aortic atherosclerosis. -Afebrile. -WBC 5.1. HGB 8.2 from 8.3 -  No pain, nausea, or vomiting. Having bowel function.  -PICC/TPN initiated 11/18. -GI consulted. Planning for colonoscopy today 11/20 to help evaluate for inflammatory bowel process vs infectious vs malignancy. -Will likely need surgical intervention during this admission.  -Will continue to follow.     FEN: NPO; IVF per primary team for electrolyte abnormalities VTE: None currently ID: None currently    LOS: 4 days   I reviewed specialist notes, consulting provider notes, hospitalist notes, last 24 h vitals and pain scores, last 48 h  intake and output, last 24 h labs and trends, and last 24 h imaging results.  This care required moderate level of medical decision making.    [name], [facility] 06/11/2024, 8:50 AM Please see Amion for pager number during day hours 7:00am-4:30pm

## 2024-06-11 NOTE — Transfer of Care (Signed)
 Immediate Anesthesia Transfer of Care Note  Patient: Brooke [name]  Procedure(s) Performed: COLONOSCOPY  Patient Location: PACU  Anesthesia Type:MAC  Level of Consciousness: sedated, patient cooperative, and responds to stimulation  Airway & Oxygen  Therapy: Patient Spontanous Breathing and Patient connected to face mask oxygen   Post-op Assessment: Report given to RN and Post -op Vital signs reviewed and stable  Post vital signs: Reviewed and stable  Last Vitals:  Vitals Value Taken Time  BP 122/53 06/11/24 13:47  Temp    Pulse 72 06/11/24 13:49  Resp 11 06/11/24 13:49  SpO2 100 % 06/11/24 13:49  Vitals shown include unfiled device data.  Last Pain:  Vitals:   06/11/24 1218  TempSrc: Oral  PainSc: 0-No pain      Patients Stated Pain Goal: 0 (06/10/24 0957)  Complications: No notable events documented.

## 2024-06-11 NOTE — Op Note (Signed)
 [facility] Patient Name: Brooke [name] Procedure Date: 06/11/2024 MRN: [id number] Attending MD: Glendia BRAVO. Stacia , MD, [id number] Date of Birth: 1950-09-10 CSN: [id number] Age: 73 Admit Type: Inpatient Procedure:                Colonoscopy Indications:              Abnormal CT of the GI tract, Suspected colonic                            obstruction Providers:                Glendia E. Stacia, MD, Darleene Bare, RN, Curtistine Bishop, Technician Referring MD:              Medicines:                Monitored Anesthesia Care Complications:            No immediate complications. Estimated Blood Loss:     Estimated blood loss was minimal. Procedure:                Pre-Anesthesia Assessment:                           - Prior to the procedure, a History and Physical                            was performed, and patient medications and                            allergies were reviewed. The patient's tolerance of                            previous anesthesia was also reviewed. The risks                            and benefits of the procedure and the sedation                            options and risks were discussed with the patient.                            All questions were answered, and informed consent                            was obtained. Prior Anticoagulants: The patient has                            taken no anticoagulant or antiplatelet agents. ASA                            Grade Assessment: III - A patient with severe  systemic disease. After reviewing the risks and                            benefits, the patient was deemed in satisfactory                            condition to undergo the procedure.                           After obtaining informed consent, the colonoscope                            was passed under direct vision. Throughout the                            procedure, the patient's  blood pressure, pulse, and                            oxygen  saturations were monitored continuously. The                            PCF-[id number] ([id number]) olympus colonscope was                            introduced through the anus and advanced to the the                            hepatic flexure. The colonoscopy was performed with                            difficulty due to a partially obstructing mass. The                            patient tolerated the procedure well. The quality                            of the bowel preparation was adequate. The rectum                            was photographed. The bowel preparation used was                            SUPREP via split dose instruction. The PCF-[id number]                            ([id number]) Olympus Colonoscope was introduced                            through the anus and advanced to the the hepatic                            flexure. Scope In: 1:11:06 PM Scope Out: 1:41:40 PM Total Procedure Duration: 0 hours 30 minutes 34 seconds  Findings:  Skin tags were found on perianal exam.      The digital rectal exam was normal. Pertinent negatives include normal       sphincter tone and no palpable rectal lesions.      An intrinsic severe stenosis measuring 3 cm (in length) x 7 mm (inner       diameter) was found at the hepatic flexure and was non-traversed. This       area was deformed and angulated. Using the ultraslim colonoscope, the       stenosis was partly transversed, revealing a circumferential ulcerated       narrowing. The distal edge of this ulcerated area was visualized, but       the colonoscope could not fully traverse it. There appeared to be       masslike lesion at the distal aspect. This was biopsied, and the tissue       was soft and friable. Biopsies were also taken from the proximal aspect       of the stenosis where some abnormal appearing mucosa was seen, but no       obvious mass. Estimated blood loss was  minimal.      A 4 mm polyp was found in the descending colon. The polyp was sessile.       The polyp was removed with a cold snare. Resection and retrieval were       complete. Estimated blood loss was minimal.      Multiple small-mouthed diverticula were found in the sigmoid colon and       descending colon. There was no evidence of diverticular bleeding.      The exam was otherwise normal throughout the examined colon.      The retroflexed view of the distal rectum and anal verge was normal and       showed no anal or rectal abnormalities. Impression:               - Perianal skin tags found on perianal exam.                           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 Biopsied. Findings are concerning for                            malignancy.                           - One 4 mm polyp in the descending colon, removed                            with a cold snare. Resected and retrieved.                           - Mild diverticulosis in the sigmoid colon and in                            the descending colon. There was no evidence of  diverticular bleeding.                           - The distal rectum and anal verge are normal on                            retroflexion view. Moderate Sedation:      Not Applicable - Patient had care per Anesthesia. Recommendation:           - Return patient to hospital ward for ongoing care.                           - Clear liquid diet today.                           - Continue present medications.                           - Await pathology results.                           - Obtain CEA level.                           - Will likely need surgery regardless of biopsy                            results. Procedure Code(s):        --- Professional ---                           [phone], 52, Colonoscopy, flexible; with removal of                            tumor(s), polyp(s), or  other lesion(s) by snare                            technique                           45380, 59,52, Colonoscopy, flexible; with biopsy,                            single or multiple Diagnosis Code(s):        --- Professional ---                           X43.300, Other intestinal obstruction unspecified                            as to partial versus complete obstruction                           D12.4, Benign neoplasm of descending colon                           K64.4, Residual hemorrhoidal skin tags  K57.30, Diverticulosis of large intestine without                            perforation or abscess without bleeding                           R93.3, Abnormal findings on diagnostic imaging of                            other parts of digestive tract CPT copyright 2022 American [employer] Association. All rights reserved. The codes documented in this report are preliminary and upon coder review may  be revised to meet current compliance requirements. Ericia Moxley E. Stacia, MD 06/11/2024 1:57:41 PM This report has been signed electronically. Number of Addenda: 0

## 2024-06-11 NOTE — Anesthesia Preprocedure Evaluation (Addendum)
 Anesthesia Evaluation  Patient identified by MRN, date of birth, ID band Patient awake    Reviewed: Allergy & Precautions, NPO status , Patient's Chart, lab work & pertinent test results, reviewed documented beta blocker date and time   History of Anesthesia Complications Negative for: history of anesthetic complications  Airway Mallampati: II  TM Distance: >3 FB Neck ROM: Full    Dental  (+) Lower Dentures, Upper Dentures   Pulmonary asthma , COPD,  COPD inhaler   Pulmonary exam normal        Cardiovascular hypertension, Pt. on home beta blockers and Pt. on medications pulmonary hypertensionNormal cardiovascular exam+ dysrhythmias Ventricular Tachycardia    '25 TTE - EF 70 to 75%. There is mild left ventricular hypertrophy. Grade I diastolic dysfunction (impaired relaxation). Findings concerning for LVOT obstruction. There is a mid cavitary gradient of 41 mmHg at rest and was 39 mmHg with valsalva but in a different location and may be underestimated. Mildly increased right ventricular wall thickness. There is moderately elevated pulmonary artery systolic pressure.     Neuro/Psych  Headaches PSYCHIATRIC DISORDERS Anxiety Depression       GI/Hepatic Neg liver ROS,GERD  Medicated and Controlled,,  Endo/Other  diabetes, Type 2Hypothyroidism   Obesity   Renal/GU CRFRenal disease     Musculoskeletal  (+) Arthritis , Osteoarthritis,    Abdominal   Peds  Hematology  (+) Blood dyscrasia, anemia   Anesthesia Other Findings Alopecia Gout   Reproductive/Obstetrics                              Anesthesia Physical Anesthesia Plan  ASA: 3  Anesthesia Plan: MAC   Post-op Pain Management: Minimal or no pain anticipated   Induction:   PONV Risk Score and Plan: 2 and Propofol  infusion and Treatment may vary due to age or medical condition  Airway Management Planned: Nasal Cannula and Natural  Airway  Additional Equipment: None  Intra-op Plan:   Post-operative Plan:   Informed Consent: I have reviewed the patients History and Physical, chart, labs and discussed the procedure including the risks, benefits and alternatives for the proposed anesthesia with the patient or authorized representative who has indicated his/her understanding and acceptance.       Plan Discussed with: CRNA and Anesthesiologist  Anesthesia Plan Comments:          Anesthesia Quick Evaluation

## 2024-06-11 NOTE — Anesthesia Postprocedure Evaluation (Signed)
 Anesthesia Post Note  Patient: Brooke [name]  Procedure(s) Performed: COLONOSCOPY     Patient location during evaluation: PACU Anesthesia Type: MAC Level of consciousness: awake and alert Pain management: pain level controlled Vital Signs Assessment: post-procedure vital signs reviewed and stable Respiratory status: spontaneous breathing, nonlabored ventilation and respiratory function stable Cardiovascular status: stable and blood pressure returned to baseline Anesthetic complications: no   No notable events documented.  Last Vitals:  Vitals:   06/11/24 1410 06/11/24 1424  BP: (!) 195/86 (!) 166/76  Pulse: 73 74  Resp: 11 18  Temp:  37 C  SpO2: 96% 100%    Last Pain:  Vitals:   06/11/24 1424  TempSrc: Oral  PainSc:                  Debby BRAVO Like

## 2024-06-11 NOTE — Progress Notes (Addendum)
 PROGRESS NOTE    Brooke [name]  FMW:[id number] DOB: May 15, 1951 DOA: 06/07/2024 PCP: Norleen Lynwood ORN, MD    Brief Narrative:   Brooke [name] is a 72 y.o. female with past medical history significant for HTN, HLD, DM2, asthma, anxiety, hypothyroidism, CKD stage IIIb, GERD who presented to [facility] ED on 06/07/2024 from home with complaint of abdominal pain associated with nausea and vomiting.  Poor appetite.  Pain is localized to the epigastric region and rated as a 6/10, sharp and constant.  No radiation.  Food exacerbates symptoms.  Denies diarrhea, no fever/chills.  Denies any blood in her stool.  Further denies chest pain, no palpitations, no shortness of breath, no cough, no urinary symptoms.  Last colonoscopy 12/2019 with diverticulosis of the sigmoid colon, otherwise normal.  No family history of colon cancer.  In the ED, temperature 98.1 F, HR 78, RR 18, BP 152/82, SpO2 99% on room air.  WBC 8.5, hemoglobin 10.3, platelet count 322.  Sodium 141, potassium 4.1, chloride 109, CO2 22, glucose 108, BUN 13, creat 1.61.  Alkaline phosphatase 180.  Lipase less than 10.  AST 27, ALT 12, total bilirubin 0.3.  Procalcitonin 0.33.  Lactic acid 2.0.  Vitamin B12 1004 and 76.  Iron 39, TIBC 207, ferritin 142.  Urinalysis with negative leukocytes negative nitrite, rare bacteria, 0-5 WBCs.  CT abdomen/pelvis with contrast with findings of enlarged appendix measuring 1.3 cm in diameter with enhancement and mild adjacent free fluid in the right lower quadrant concerning for mild acute appendicitis, focal circumferential narrowing with slight associated increased soft tissue density/enhancement over the ascending colon in the region of the hepatic flexure concerning for a focal mass/neoplasm, wall thickening of a moderate segment of distal ileum may be due to small bowel enteritis versus infectious versus inflammatory in nature, mild free peritoneal fluid, several liver cyst unchanged, 2.8 cm hyperdense mass over the  upper pole left kidney unchanged compatible with slightly hyperdense cyst, aortic atherosclerosis.  General surgery was consulted.  TRH consulted for admission for further evaluation and management.  Assessment & Plan:   Ascending colon mass, concerning for malignancy Patient presenting with abdominal pain, nausea, vomiting and poor appetite.  Pain localized to the epigastric region.  Colonoscopy 2021 with diverticulosis of the sigmoid colon otherwise unrevealing.  CT abdomen/pelvis on admission with findings enlarged appendix measuring 1.3 cm in diameter with enhancement and mild adjacent free fluid in the right lower quadrant concerning for mild acute appendicitis, focal circumferential narrowing with slight associated increased soft tissue density/enhancement over the ascending colon in the region of the hepatic flexure concerning for a focal mass/neoplasm, wall thickening of a moderate segment of distal ileum may be due to small bowel enteritis versus infectious versus inflammatory in nature, mild free peritoneal fluid.  Colonoscopy 06/11/2024 with findings of a severe ulcerated stricture at the hepatic flexure concerning for malignancy, 4 mm polyp descending colon removed with cold snare.  -- [facility], [facility] following; appreciate assistance -- PICC line placed and started on TPN 11/18 -- clear liquid diet -- Pathology: Pending -- CEA: Pending -- Zofran  as needed nausea/vomiting -- Morphine 2 mg IV every 2 hours as needed severe pain -- Await further recommendations from general surgery  Acute appendicitis, ruled out Atypical findings on CT, seen by general surgery and clinically without signs or symptoms of acute appendicitis, suspect area of concern related to above mass and obstructive pathology.  Hypokalemia Repleted, potassium 3.7 this morning. -- Repeat electrolytes in a.m.  HTN --  Diltiazem  100 mg p.o. daily -- Metoprolol  succinate 100 mg p.o. daily  HLD -- Crestor  40 mg p.o.  daily  DM2 Hemoglobin A1c 5.4. -- Sensitive SSI for coverage -- CBG every 4 hours  CKD stage IIIb Baseline creatinine 1.5-1.9, stable. -- Cr 1.82>1.61 -- On TPN -- BMP in a.m.  Depression with anxiety -- Celexa  40 mg p.o. daily  Asthma -- Breztri  2 puffs twice daily (substituted for home Trelegy)  GERD -- Protonix  40 mg IV every 24 hours  Obesity, class I Body mass index is 31.44 kg/m.   DVT prophylaxis: SCDs Start: 06/07/24 2000 Place TED hose Start: 06/07/24 2000    Code Status: Full Code Family Communication: No family present at bedside this am  Disposition Plan:  Level of care: Progressive Status is: Inpatient Remains inpatient appropriate because: Pending further surgical recommendations given obstructive colon mass concerning for malignancy  Consultants:   gastroenterology General Surgery  Procedures:  Colonoscopy 11/20  Antimicrobials:  None   Subjective: Patient seen examined bedside, lying in bed.  Awaiting colonoscopy this afternoon.  No other specific questions, concerns or complaints at this time.  Denies headache, no visual changes, no chest pain, no palpitations, no shortness of breath, no abdominal pain, no fever/chills/night sweats, no focal weakness, no fatigue, no paresthesia.  No acute events overnight per nursing staff.   Objective: Vitals:   06/11/24 1347 06/11/24 1350 06/11/24 1400 06/11/24 1410  BP: (!) 122/53 (!) 130/58 (!) 169/79   Pulse: 73 70 85 73  Resp: 12 13 17 11   Temp: (!) 97.5 F (36.4 C)     TempSrc: Temporal     SpO2: 100% 100% 94% 96%  Weight:      Height:        Intake/Output Summary (Last 24 hours) at 06/11/2024 1421 Last data filed at 06/11/2024 1343 Gross per 24 hour  Intake 594.45 ml  Output --  Net 594.45 ml   Filed Weights   06/07/24 1855  Weight: 80.5 kg    Examination:  Physical Exam: GEN: NAD, alert and oriented x 3, obese  HEENT: NCAT, PERRL, EOMI, sclera clear, MMM PULM: CTAB w/o  wheezes/crackles, normal respiratory effort, room air CV: RRR w/o M/G/R GI: abd soft, NTND, + BS MSK: no peripheral edema, moves all extremities independently NEURO: No focal neurological deficit PSYCH: normal mood/affect Integumentary: dry/intact, no rashes or wounds    Data Reviewed: I have personally reviewed following labs and imaging studies  CBC: Recent Labs  Lab 06/07/24 1350 06/08/24 0404 06/10/24 0343 06/11/24 0407  WBC 8.5 10.4 7.3 5.1  NEUTROABS 7.0  --   --   --   HGB 10.3* 10.3* 8.3* 8.2*  HCT 32.2* 32.8* 26.7* 25.4*  MCV 84.3 86.3 86.4 85.2  PLT 322 320 181 172   Basic Metabolic Panel: Recent Labs  Lab 06/07/24 1350 06/07/24 1842 06/08/24 0404 06/09/24 0413 06/10/24 0343 06/11/24 0407  NA 141  --  144 144 140 138  K 4.1  --  3.7 3.2* 3.1* 3.7  CL 109  --  111 112* 108 108  CO2 22  --  19* 21* 21* 21*  GLUCOSE 108*  --  104* 91 123* 97  BUN 13  --  12 12 17 17   CREATININE 1.61*  --  1.49* 1.47* 1.82* 1.64*  CALCIUM  8.5*  --  8.5* 8.0* 7.8* 7.7*  MG  --  1.6* 2.4  --  2.5* 2.0  PHOS  --   --   --   --  1.6* 2.7   GFR: Estimated Creatinine Clearance: 31.1 mL/min (A) (by C-G formula based on SCr of 1.64 mg/dL (H)). Liver Function Tests: Recent Labs  Lab 06/07/24 1350 06/08/24 0404 06/10/24 0343 06/11/24 0407  AST 27 21 14* 13*  ALT 12 13 9 9   ALKPHOS 180* 176* 127* 115  BILITOT 0.3 0.3 <0.2 <0.2  PROT 5.7* 5.8* 4.4* 4.5*  ALBUMIN 2.9* 2.7* 2.2* 2.2*   Recent Labs  Lab 06/07/24 1350  LIPASE <10*   No results for input(s): AMMONIA in the last 168 hours. Coagulation Profile: Recent Labs  Lab 06/08/24 0404  INR 1.2   Cardiac Enzymes: No results for input(s): CKTOTAL, CKMB, CKMBINDEX, TROPONINI in the last 168 hours. BNP (last 3 results) No results for input(s): PROBNP in the last 8760 hours. HbA1C: No results for input(s): HGBA1C in the last 72 hours. CBG: Recent Labs  Lab 06/10/24 2015 06/10/24 2355  06/11/24 0410 06/11/24 0734 06/11/24 1145  GLUCAP 156* 166* 102* 113* 123*   Lipid Profile: No results for input(s): CHOL, HDL, LDLCALC, TRIG, CHOLHDL, LDLDIRECT in the last 72 hours. Thyroid  Function Tests: No results for input(s): TSH, T4TOTAL, FREET4, T3FREE, THYROIDAB in the last 72 hours. Anemia Panel: No results for input(s): VITAMINB12, FOLATE, FERRITIN, TIBC, IRON, RETICCTPCT in the last 72 hours.  Sepsis Labs: Recent Labs  Lab 06/07/24 1841 06/07/24 1916 06/07/24 2100 06/08/24 0404  PROCALCITON 0.33  --   --  0.35  LATICACIDVEN  --  2.0* 1.3  --     No results found for this or any previous visit (from the past 240 hours).       Radiology Studies: No results found.       Scheduled Meds:  allopurinol   100 mg Oral Daily   atropine  1 drop Left Eye Daily   azelastine   2 spray Each Nare BID   brimonidine   1 drop Left Eye BID   budesonide -glycopyrrolate -formoterol   2 puff Inhalation BID   Chlorhexidine  Gluconate Cloth  6 each Topical Daily   citalopram   40 mg Oral Daily   diltiazem   180 mg Oral Daily   erythromycin   Left Eye QHS   feeding supplement  1 Container Oral TID BM   insulin  aspart  0-9 Units Subcutaneous Q4H   latanoprost   1 drop Left Eye QHS   loratadine  10 mg Oral Daily   metoprolol  succinate  100 mg Oral Daily   pantoprazole  (PROTONIX ) IV  40 mg Intravenous Q24H   rosuvastatin   40 mg Oral Daily   sodium bicarbonate   1,300 mg Oral Daily   Continuous Infusions:  lactated ringers  1,000 mL with potassium chloride  40 mEq infusion     TPN ADULT (ION) 40 mL/hr at 06/10/24 1812   TPN ADULT (ION)       LOS: 4 days    Time spent: 50 minutes spent on 06/11/2024 caring for this patient face-to-face including chart review, ordering labs/tests, documenting, discussion with nursing staff, consultants, updating family and interview/physical exam    Camellia PARAS Courtenay Hirth, DO [facility] Available via Epic  secure chat 7am-7pm After these hours, please refer to coverage provider listed on [email or web address] 06/11/2024, 2:21 PM

## 2024-06-12 ENCOUNTER — Inpatient Hospital Stay (HOSPITAL_COMMUNITY): Admitting: Certified Registered"

## 2024-06-12 ENCOUNTER — Encounter (HOSPITAL_COMMUNITY)
Admission: EM | Disposition: A | Discharge status: Home / Self Care | Payer: Self-pay | Source: Home / Self Care | Attending: Internal Medicine

## 2024-06-12 ENCOUNTER — Encounter (HOSPITAL_COMMUNITY): Payer: Self-pay | Admitting: Family Medicine

## 2024-06-12 DIAGNOSIS — K6389 Other specified diseases of intestine: Secondary | ICD-10-CM | POA: Diagnosis not present

## 2024-06-12 DIAGNOSIS — I4891 Unspecified atrial fibrillation: Secondary | ICD-10-CM

## 2024-06-12 DIAGNOSIS — J449 Chronic obstructive pulmonary disease, unspecified: Secondary | ICD-10-CM

## 2024-06-12 DIAGNOSIS — I10 Essential (primary) hypertension: Secondary | ICD-10-CM

## 2024-06-12 DIAGNOSIS — K56609 Unspecified intestinal obstruction, unspecified as to partial versus complete obstruction: Secondary | ICD-10-CM

## 2024-06-12 HISTORY — PX: LAPAROSCOPIC PARTIAL COLECTOMY: SHX5907

## 2024-06-12 LAB — GLUCOSE, CAPILLARY
Glucose-Capillary: 128 mg/dL — ABNORMAL HIGH (ref 70–99)
Glucose-Capillary: 130 mg/dL — ABNORMAL HIGH (ref 70–99)
Glucose-Capillary: 131 mg/dL — ABNORMAL HIGH (ref 70–99)
Glucose-Capillary: 137 mg/dL — ABNORMAL HIGH (ref 70–99)
Glucose-Capillary: 140 mg/dL — ABNORMAL HIGH (ref 70–99)
Glucose-Capillary: 195 mg/dL — ABNORMAL HIGH (ref 70–99)

## 2024-06-12 LAB — COMPREHENSIVE METABOLIC PANEL WITH GFR
ALT: 9 U/L (ref 0–44)
AST: 12 U/L — ABNORMAL LOW (ref 15–41)
Albumin: 2 g/dL — ABNORMAL LOW (ref 3.5–5.0)
Alkaline Phosphatase: 91 U/L (ref 38–126)
Anion gap: 6 (ref 5–15)
BUN: 14 mg/dL (ref 8–23)
CO2: 22 mmol/L (ref 22–32)
Calcium: 7.7 mg/dL — ABNORMAL LOW (ref 8.9–10.3)
Chloride: 112 mmol/L — ABNORMAL HIGH (ref 98–111)
Creatinine, Ser: 1.38 mg/dL — ABNORMAL HIGH (ref 0.44–1.00)
GFR, Estimated: 40 mL/min — ABNORMAL LOW (ref >60)
Glucose, Bld: 128 mg/dL — ABNORMAL HIGH (ref 70–99)
Potassium: 3.7 mmol/L (ref 3.5–5.1)
Sodium: 140 mmol/L (ref 135–145)
Total Bilirubin: <0.2 mg/dL (ref 0.0–1.2)
Total Protein: 4 g/dL — ABNORMAL LOW (ref 6.5–8.1)

## 2024-06-12 LAB — CBC
HCT: 21.5 % — ABNORMAL LOW (ref 36.0–46.0)
HCT: 25 % — ABNORMAL LOW (ref 36.0–46.0)
Hemoglobin: 6.8 g/dL — CL (ref 12.0–15.0)
Hemoglobin: 7.9 g/dL — ABNORMAL LOW (ref 12.0–15.0)
MCH: 27.2 pg (ref 26.0–34.0)
MCH: 27.3 pg (ref 26.0–34.0)
MCHC: 31.6 g/dL (ref 30.0–36.0)
MCHC: 31.6 g/dL (ref 30.0–36.0)
MCV: 86 fL (ref 80.0–100.0)
MCV: 86.5 fL (ref 80.0–100.0)
Platelets: 131 K/uL — ABNORMAL LOW (ref 150–400)
Platelets: 155 K/uL (ref 150–400)
RBC: 2.5 MIL/uL — ABNORMAL LOW (ref 3.87–5.11)
RBC: 2.89 MIL/uL — ABNORMAL LOW (ref 3.87–5.11)
RDW: 18.2 % — ABNORMAL HIGH (ref 11.5–15.5)
RDW: 18.1 % — ABNORMAL HIGH (ref 11.5–15.5)
WBC: 4.4 K/uL (ref 4.0–10.5)
WBC: 6.4 K/uL (ref 4.0–10.5)
nRBC: 0 % (ref 0.0–0.2)
nRBC: 0 % (ref 0.0–0.2)

## 2024-06-12 LAB — MAGNESIUM: Magnesium: 1.8 mg/dL (ref 1.7–2.4)

## 2024-06-12 LAB — SURGICAL PCR SCREEN
MRSA, PCR: POSITIVE — AB
Staphylococcus aureus: POSITIVE — AB

## 2024-06-12 LAB — PREPARE RBC (CROSSMATCH)

## 2024-06-12 LAB — HEMOGLOBIN AND HEMATOCRIT, BLOOD
HCT: 27.4 % — ABNORMAL LOW (ref 36.0–46.0)
Hemoglobin: 8.7 g/dL — ABNORMAL LOW (ref 12.0–15.0)

## 2024-06-12 LAB — PHOSPHORUS: Phosphorus: 1.8 mg/dL — ABNORMAL LOW (ref 2.5–4.6)

## 2024-06-12 SURGERY — LAPAROSCOPIC PARTIAL COLECTOMY
Anesthesia: General | Site: Abdomen

## 2024-06-12 MED ORDER — LACTATED RINGERS IV SOLN: INTRAVENOUS | Status: DC

## 2024-06-12 MED ORDER — ACETAMINOPHEN 10 MG/ML IV SOLN: 1000.0000 mg | Freq: Once | INTRAVENOUS | Status: DC | PRN

## 2024-06-12 MED ORDER — STERILE WATER FOR IRRIGATION IR SOLN
Status: DC | PRN
  Administered 2024-06-12: 1000 mL

## 2024-06-12 MED ORDER — ENOXAPARIN SODIUM 40 MG/0.4ML IJ SOSY
40.0000 mg | PREFILLED_SYRINGE | INTRAMUSCULAR | Status: DC
  Administered 2024-06-13: 40 mg via SUBCUTANEOUS
  Filled 2024-06-12: qty 0.4

## 2024-06-12 MED ORDER — ACETAMINOPHEN 10 MG/ML IV SOLN
1000.0000 mg | Freq: Four times a day (QID) | INTRAVENOUS | Status: AC
  Administered 2024-06-12 – 2024-06-13 (×4): 1000 mg via INTRAVENOUS
  Filled 2024-06-12 (×4): qty 100

## 2024-06-12 MED ORDER — ALVIMOPAN 12 MG PO CAPS
12.0000 mg | ORAL_CAPSULE | Freq: Two times a day (BID) | ORAL | Status: DC
  Administered 2024-06-13 – 2024-06-14 (×4): 12 mg via ORAL
  Filled 2024-06-12 (×4): qty 1

## 2024-06-12 MED ORDER — FENTANYL CITRATE (PF) 250 MCG/5ML IJ SOLN
INTRAMUSCULAR | Status: DC | PRN
  Administered 2024-06-12 (×2): 50 ug via INTRAVENOUS
  Administered 2024-06-12: 25 ug via INTRAVENOUS
  Administered 2024-06-12 (×2): 50 ug via INTRAVENOUS
  Administered 2024-06-12: 25 ug via INTRAVENOUS

## 2024-06-12 MED ORDER — EPHEDRINE SULFATE (PRESSORS) 25 MG/5ML IV SOSY
PREFILLED_SYRINGE | INTRAVENOUS | Status: DC | PRN
  Administered 2024-06-12: 10 mg via INTRAVENOUS
  Administered 2024-06-12: 15 mg via INTRAVENOUS

## 2024-06-12 MED ORDER — INSULIN ASPART 100 UNIT/ML IJ SOLN
0.0000 [IU] | Freq: Four times a day (QID) | INTRAMUSCULAR | Status: DC
  Administered 2024-06-12: 2 [IU] via SUBCUTANEOUS
  Administered 2024-06-13: 3 [IU] via SUBCUTANEOUS
  Administered 2024-06-13: 5 [IU] via SUBCUTANEOUS
  Administered 2024-06-13 (×2): 2 [IU] via SUBCUTANEOUS
  Administered 2024-06-14 – 2024-06-15 (×8): 1 [IU] via SUBCUTANEOUS
  Administered 2024-06-16: 2 [IU] via SUBCUTANEOUS
  Administered 2024-06-16: 1 [IU] via SUBCUTANEOUS
  Administered 2024-06-16: 2 [IU] via SUBCUTANEOUS
  Administered 2024-06-17 (×2): 1 [IU] via SUBCUTANEOUS
  Administered 2024-06-17: 2 [IU] via SUBCUTANEOUS
  Administered 2024-06-17 (×2): 1 [IU] via SUBCUTANEOUS
  Administered 2024-06-18 – 2024-06-19 (×2): 2 [IU] via SUBCUTANEOUS
  Administered 2024-06-20 – 2024-06-21 (×2): 1 [IU] via SUBCUTANEOUS
  Filled 2024-06-12: qty 2
  Filled 2024-06-12 (×3): qty 1
  Filled 2024-06-12: qty 3
  Filled 2024-06-12: qty 2
  Filled 2024-06-12 (×4): qty 1
  Filled 2024-06-12 (×2): qty 2
  Filled 2024-06-12: qty 1
  Filled 2024-06-12: qty 2
  Filled 2024-06-12: qty 1
  Filled 2024-06-12: qty 2
  Filled 2024-06-12 (×2): qty 1
  Filled 2024-06-12: qty 2
  Filled 2024-06-12 (×3): qty 1
  Filled 2024-06-12: qty 2
  Filled 2024-06-12: qty 5

## 2024-06-12 MED ORDER — HYDROMORPHONE HCL 1 MG/ML IJ SOLN
0.2500 mg | INTRAMUSCULAR | Status: DC | PRN
  Administered 2024-06-12: 0.25 mg via INTRAVENOUS
  Administered 2024-06-12: 0.5 mg via INTRAVENOUS
  Administered 2024-06-12: 0.25 mg via INTRAVENOUS

## 2024-06-12 MED ORDER — LIDOCAINE HCL (PF) 2 % IJ SOLN
INTRAMUSCULAR | Status: AC
  Filled 2024-06-12: qty 5

## 2024-06-12 MED ORDER — ALVIMOPAN 12 MG PO CAPS
12.0000 mg | ORAL_CAPSULE | ORAL | Status: AC
  Administered 2024-06-12: 12 mg via ORAL
  Filled 2024-06-12: qty 1

## 2024-06-12 MED ORDER — OXYCODONE HCL 5 MG PO TABS: 5.0000 mg | ORAL_TABLET | Freq: Once | ORAL | Status: DC | PRN

## 2024-06-12 MED ORDER — PROPOFOL 10 MG/ML IV BOLUS
INTRAVENOUS | Status: DC | PRN
  Administered 2024-06-12: 100 mg via INTRAVENOUS

## 2024-06-12 MED ORDER — TRAVASOL 10 % IV SOLN
INTRAVENOUS | Status: AC
  Filled 2024-06-12: qty 840

## 2024-06-12 MED ORDER — BUPIVACAINE-EPINEPHRINE 0.25% -1:200000 IJ SOLN
INTRAMUSCULAR | Status: DC | PRN
  Administered 2024-06-12: 30 mL

## 2024-06-12 MED ORDER — POTASSIUM PHOSPHATES 15 MMOLE/5ML IV SOLN
30.0000 mmol | Freq: Once | INTRAVENOUS | Status: AC
  Administered 2024-06-12: 30 mmol via INTRAVENOUS
  Filled 2024-06-12: qty 10

## 2024-06-12 MED ORDER — LIDOCAINE 2% (20 MG/ML) 5 ML SYRINGE
INTRAMUSCULAR | Status: DC | PRN
  Administered 2024-06-12: 40 mg via INTRAVENOUS

## 2024-06-12 MED ORDER — CHLORHEXIDINE GLUCONATE CLOTH 2 % EX PADS
6.0000 | MEDICATED_PAD | Freq: Every day | CUTANEOUS | Status: AC
  Administered 2024-06-12 – 2024-06-16 (×5): 6 via TOPICAL

## 2024-06-12 MED ORDER — SODIUM CHLORIDE 0.9% IV SOLUTION: Freq: Once | INTRAVENOUS | Status: DC

## 2024-06-12 MED ORDER — DEXAMETHASONE SOD PHOSPHATE PF 10 MG/ML IJ SOLN
INTRAMUSCULAR | Status: DC | PRN
  Administered 2024-06-12: 10 mg via INTRAVENOUS

## 2024-06-12 MED ORDER — HYDROMORPHONE HCL 1 MG/ML IJ SOLN
INTRAMUSCULAR | Status: AC
  Filled 2024-06-12: qty 1

## 2024-06-12 MED ORDER — ONDANSETRON HCL 4 MG/2ML IJ SOLN
INTRAMUSCULAR | Status: DC | PRN
  Administered 2024-06-12: 4 mg via INTRAVENOUS

## 2024-06-12 MED ORDER — PHENYLEPHRINE HCL (PRESSORS) 10 MG/ML IV SOLN
INTRAVENOUS | Status: DC | PRN
Start: 2024-06-12 — End: 2024-06-12
  Administered 2024-06-12: 200 ug via INTRAVENOUS

## 2024-06-12 MED ORDER — PROPOFOL 10 MG/ML IV BOLUS
INTRAVENOUS | Status: AC
  Filled 2024-06-12: qty 20

## 2024-06-12 MED ORDER — LACTATED RINGERS IR SOLN
Status: DC | PRN
  Administered 2024-06-12: 1000 mL

## 2024-06-12 MED ORDER — SUCCINYLCHOLINE 20MG/ML (10ML) SYRINGE FOR MEDFUSION PUMP - OPTIME
INTRAMUSCULAR | Status: DC | PRN
  Administered 2024-06-12: 120 mg via INTRAVENOUS

## 2024-06-12 MED ORDER — PHENYLEPHRINE HCL-NACL 20-0.9 MG/250ML-% IV SOLN
INTRAVENOUS | Status: DC | PRN
  Administered 2024-06-12: 50 ug/min via INTRAVENOUS

## 2024-06-12 MED ORDER — HEMOSTATIC AGENTS (NO CHARGE) OPTIME
TOPICAL | Status: DC | PRN
  Administered 2024-06-12: 1 via TOPICAL

## 2024-06-12 MED ORDER — SUGAMMADEX SODIUM 200 MG/2ML IV SOLN
INTRAVENOUS | Status: AC
  Filled 2024-06-12: qty 2

## 2024-06-12 MED ORDER — MORPHINE SULFATE (PF) 2 MG/ML IV SOLN
1.0000 mg | INTRAVENOUS | Status: DC | PRN
  Administered 2024-06-12: 4 mg via INTRAVENOUS
  Administered 2024-06-12 (×2): 2 mg via INTRAVENOUS
  Administered 2024-06-13 (×3): 4 mg via INTRAVENOUS
  Administered 2024-06-13 (×3): 2 mg via INTRAVENOUS
  Administered 2024-06-13 – 2024-06-14 (×5): 4 mg via INTRAVENOUS
  Administered 2024-06-15 (×3): 2 mg via INTRAVENOUS
  Administered 2024-06-15: 4 mg via INTRAVENOUS
  Administered 2024-06-16 – 2024-06-20 (×15): 2 mg via INTRAVENOUS
  Filled 2024-06-12 (×5): qty 1
  Filled 2024-06-12: qty 2
  Filled 2024-06-12: qty 1
  Filled 2024-06-12 (×2): qty 2
  Filled 2024-06-12 (×6): qty 1
  Filled 2024-06-12: qty 2
  Filled 2024-06-12 (×2): qty 1
  Filled 2024-06-12 (×3): qty 2
  Filled 2024-06-12 (×3): qty 1
  Filled 2024-06-12 (×2): qty 2
  Filled 2024-06-12 (×2): qty 1
  Filled 2024-06-12 (×2): qty 2
  Filled 2024-06-12 (×3): qty 1

## 2024-06-12 MED ORDER — ONDANSETRON HCL 4 MG/2ML IJ SOLN
INTRAMUSCULAR | Status: AC
  Filled 2024-06-12: qty 2

## 2024-06-12 MED ORDER — OXYCODONE HCL 5 MG/5ML PO SOLN: 5.0000 mg | Freq: Once | ORAL | Status: DC | PRN

## 2024-06-12 MED ORDER — SODIUM CHLORIDE 0.9 % IV SOLN
2.0000 g | INTRAVENOUS | Status: AC
  Administered 2024-06-12: 2 g via INTRAVENOUS
  Filled 2024-06-12 (×2): qty 2

## 2024-06-12 MED ORDER — MUPIROCIN 2 % EX OINT
1.0000 | TOPICAL_OINTMENT | Freq: Two times a day (BID) | CUTANEOUS | Status: AC
  Administered 2024-06-12 – 2024-06-17 (×10): 1 via NASAL
  Filled 2024-06-12 (×2): qty 22

## 2024-06-12 MED ORDER — CHLORHEXIDINE GLUCONATE 0.12 % MT SOLN
15.0000 mL | Freq: Once | OROMUCOSAL | Status: AC
  Administered 2024-06-12: 15 mL via OROMUCOSAL

## 2024-06-12 MED ORDER — METHOCARBAMOL 1000 MG/10ML IJ SOLN
500.0000 mg | Freq: Three times a day (TID) | INTRAMUSCULAR | Status: DC | PRN
  Administered 2024-06-12 – 2024-06-14 (×3): 500 mg via INTRAVENOUS
  Filled 2024-06-12 (×3): qty 10

## 2024-06-12 MED ORDER — SUGAMMADEX SODIUM 200 MG/2ML IV SOLN
INTRAVENOUS | Status: DC | PRN
  Administered 2024-06-12: 200 mg via INTRAVENOUS

## 2024-06-12 MED ORDER — HEPARIN SODIUM (PORCINE) 5000 UNIT/ML IJ SOLN
5000.0000 [IU] | Freq: Once | INTRAMUSCULAR | Status: AC
  Administered 2024-06-12: 5000 [IU] via SUBCUTANEOUS
  Filled 2024-06-12: qty 1

## 2024-06-12 MED ORDER — BUPIVACAINE-EPINEPHRINE (PF) 0.25% -1:200000 IJ SOLN
INTRAMUSCULAR | Status: AC
  Filled 2024-06-12: qty 30

## 2024-06-12 MED ORDER — FENTANYL CITRATE (PF) 250 MCG/5ML IJ SOLN
INTRAMUSCULAR | Status: AC
  Filled 2024-06-12: qty 5

## 2024-06-12 MED ORDER — INDOCYANINE GREEN 25 MG IV SOLR
INTRAVENOUS | Status: DC | PRN
  Administered 2024-06-12: 2.5 mg via INTRAVENOUS

## 2024-06-12 MED ORDER — DROPERIDOL 2.5 MG/ML IJ SOLN: 0.6250 mg | Freq: Once | INTRAMUSCULAR | Status: DC | PRN

## 2024-06-12 MED ORDER — ALBUMIN HUMAN 5 % IV SOLN: INTRAVENOUS | Status: DC | PRN

## 2024-06-12 MED ORDER — 0.9 % SODIUM CHLORIDE (POUR BTL) OPTIME
TOPICAL | Status: DC | PRN
  Administered 2024-06-12: 1000 mL
  Administered 2024-06-12: 2000 mL

## 2024-06-12 MED ORDER — ROCURONIUM 10MG/ML (10ML) SYRINGE FOR MEDFUSION PUMP - OPTIME
INTRAVENOUS | Status: DC | PRN
  Administered 2024-06-12 (×2): 50 mg via INTRAVENOUS

## 2024-06-12 SURGICAL SUPPLY — 62 items
BAG COUNTER SPONGE SURGICOUNT (BAG) IMPLANT
BLADE EXTENDED COATED 6.5IN (ELECTRODE) IMPLANT
CHLORAPREP W/TINT 26 (MISCELLANEOUS) ×1 IMPLANT
CLIP APPLIE 5 13 M/L LIGAMAX5 (MISCELLANEOUS) IMPLANT
CLIP APPLIE ROT 10 11.4 M/L (STAPLE) IMPLANT
COUNTER NEEDLE 1200 MAGNETIC (NEEDLE) ×1 IMPLANT
COVER MAYO STAND STRL (DRAPES) ×3 IMPLANT
DRAIN CHANNEL 19F RND (DRAIN) IMPLANT
DRAPE LAPAROSCOPIC ABDOMINAL (DRAPES) ×1 IMPLANT
DRSG OPSITE POSTOP 4X6 (GAUZE/BANDAGES/DRESSINGS) IMPLANT
DRSG OPSITE POSTOP 4X8 (GAUZE/BANDAGES/DRESSINGS) IMPLANT
DRSG TEGADERM 2-3/8X2-3/4 SM (GAUZE/BANDAGES/DRESSINGS) IMPLANT
DRSG TELFA 3X8 NADH STRL (GAUZE/BANDAGES/DRESSINGS) IMPLANT
ELECT REM PT RETURN 15FT ADLT (MISCELLANEOUS) ×1 IMPLANT
EVACUATOR SILICONE 100CC (DRAIN) IMPLANT
GAUZE SPONGE 2X2 8PLY STRL LF (GAUZE/BANDAGES/DRESSINGS) IMPLANT
GAUZE SPONGE 4X4 12PLY STRL (GAUZE/BANDAGES/DRESSINGS) IMPLANT
GLOVE BIO SURGEON STRL SZ7.5 (GLOVE) ×1 IMPLANT
GLOVE BIOGEL PI IND STRL 6.5 (GLOVE) IMPLANT
GLOVE INDICATOR 8.0 STRL GRN (GLOVE) ×1 IMPLANT
GOWN SPEC L4 XLG W/TWL (GOWN DISPOSABLE) IMPLANT
GOWN STRL REUS W/ TWL XL LVL3 (GOWN DISPOSABLE) ×2 IMPLANT
IRRIGATION SUCT STRKRFLW 2 WTP (MISCELLANEOUS) ×1 IMPLANT
KIT TURNOVER KIT A (KITS) ×1 IMPLANT
LEGGING LITHOTOMY PAIR STRL (DRAPES) IMPLANT
LIGASURE IMPACT 36 18CM CVD LR (INSTRUMENTS) IMPLANT
PACK COLON (CUSTOM PROCEDURE TRAY) ×1 IMPLANT
PACK SPY-PHI (KITS) IMPLANT
PAD POSITIONING PINK XL (MISCELLANEOUS) IMPLANT
PENCIL SMOKE EVACUATOR (MISCELLANEOUS) IMPLANT
POWDER SURGICEL 3.0 GRAM (HEMOSTASIS) IMPLANT
PROTECTOR NERVE ULNAR (MISCELLANEOUS) IMPLANT
RELOAD STAPLE 60 3.6 BLU REG (STAPLE) IMPLANT
RETRACTOR WND ALEXIS 18 MED (MISCELLANEOUS) IMPLANT
RETRACTOR WND ALEXIS 25 LRG (MISCELLANEOUS) IMPLANT
SEPRAFILM MEMBRANE 5X6 (MISCELLANEOUS) IMPLANT
SEPRAFILM PROCEDURAL PACK 3X5 (MISCELLANEOUS) IMPLANT
SET TUBE SMOKE EVAC HIGH FLOW (TUBING) ×1 IMPLANT
SHEARS HARMONIC 36 ACE (MISCELLANEOUS) ×1 IMPLANT
SLEEVE Z-THREAD 5X100MM (TROCAR) ×2 IMPLANT
SPONGE T-LAP 18X18 ~~LOC~~+RFID (SPONGE) IMPLANT
STAPLE ECHEON FLEX 60 POW ENDO (STAPLE) IMPLANT
STAPLER SKIN PROX 35W (STAPLE) ×1 IMPLANT
SUT ETHILON 2 0 PS N (SUTURE) IMPLANT
SUT MNCRL AB 4-0 PS2 18 (SUTURE) IMPLANT
SUT NOVA 1 T20/GS 25DT (SUTURE) IMPLANT
SUT PDS AB 0 CTX 60 (SUTURE) IMPLANT
SUT PDS AB 1 TP1 96 (SUTURE) IMPLANT
SUT SILK 2 0 SH CR/8 (SUTURE) ×1 IMPLANT
SUT SILK 2-0 18XBRD TIE 12 (SUTURE) ×1 IMPLANT
SUT SILK 3 0 SH CR/8 (SUTURE) ×1 IMPLANT
SUT SILK 3-0 18XBRD TIE 12 (SUTURE) ×1 IMPLANT
SUT VIC AB 1 CTX 18 (SUTURE) IMPLANT
SUT VIC AB 3-0 SH 18 (SUTURE) IMPLANT
SYSTEM LAPSCP GELPORT 120MM (MISCELLANEOUS) IMPLANT
TAPE UMBILICAL 1/8 X36 TWILL (MISCELLANEOUS) IMPLANT
TOWEL OR DSP ST BLU DLX 10/PK (DISPOSABLE) IMPLANT
TRAY FOLEY MTR SLVR 14FR STAT (SET/KITS/TRAYS/PACK) IMPLANT
TRAY FOLEY MTR SLVR 16FR STAT (SET/KITS/TRAYS/PACK) IMPLANT
TROCAR 11X100 Z THREAD (TROCAR) IMPLANT
TROCAR ADV FIXATION 12X100MM (TROCAR) IMPLANT
TROCAR Z-THREAD OPTICAL 5X100M (TROCAR) ×1 IMPLANT

## 2024-06-12 NOTE — Progress Notes (Addendum)
 PHARMACY - TOTAL PARENTERAL NUTRITION CONSULT NOTE   Indication: Planned SBR  Patient Measurements: Height: 5' 3 (160 cm) Weight: 80.5 kg (177 lb 7.5 oz) IBW/kg (Calculated) : 52.4 TPN AdjBW (KG): 59.4 Body mass index is 31.44 kg/m. Usual Weight:   Assessment: Abdominal pain, NV, unable to keep anything down; concerns for hepatic flexure stricture, terminal ileitis and a liver mass. Dilation of appendix also noted concerning for mild acute appendicitis Will need colonoscopy and possible surgical intervention. Tentatively plan colonoscopy Thursday, 11/20. Pharmacy consulted for TPN management.  Glucose / Insulin : +DM not currently on any treatment PTA - CBGs 113-166 in last 24hrs - 6 units of insulin  used in the last 24hrs Electrolytes: Cl 112 slightly elevated, Phos 1.8 low. All others WNL, including CoCa 9.2 Renal: CKD3 with Scr 1.38 improved.  Hepatic: albumin  2.0 low, otherwise okay Intake / Output; MIVF: UOP: 3x occurrence in last 24hrs Stool: 2x occurrence in last 24hrs  GI Imaging: - 11/16 CT:  Enlarged appendix concerning for mild acute appendicitis.Circumferential narrowing with slight associated increased soft tissue density/enhancement over the ascending colon in the region of the hepatic flexure. This is concerning for a focal mass/neoplasm. Wall thickening of a moderate segment of the distal ileum. Findings may be due to small bowel enteritis of infectious or inflammatory nature. 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 Mild diverticulosis with no evidence of diverticular bleeding.  - 11/21 laparoscopic partial colectomy  Central access: PICC 11/18 TPN start date: 11/18  Nutritional Goals: Goal TPN rate is 27mL/hr (provides 84 g of protein and 1700 kcals per day)  RD Assessment: TBD Estimated Needs Total Energy Estimated Needs: 1550-1750 Total Protein Estimated Needs:  75-85g Total Fluid Estimated Needs: 1.7L/day  Current Nutrition:  NPO and TPN Received 1x Boost Breeze 11/20  Plan:  Now: - Kphos 30mmol IV x1  At 1800: - Continue TPN at goal rate of 70 mL/hr - Electrolytes in TPN:  Na 49mEq/L K 50mEq/L Ca 64mEq/L Mg 76mEq/L - increased Phos 15mmol/L Cl:Ac max acetate - Add standard MVI and trace elements to TPN - Change to Sensitive q6h SSI and adjust as needed - Monitor TPN labs on Mon/Thurs, and PRN   Lacinda Moats, PharmD Clinical Pharmacist  11/21/20257:13 AM

## 2024-06-12 NOTE — Progress Notes (Signed)
 PROGRESS NOTE    Brooke [name]  FMW:[id number] DOB: [birth year] DOA: 06/07/2024 PCP: Norleen Lynwood ORN, MD    Brief Narrative:   Brooke [name] is a 73 y.o. female with past medical history significant for HTN, HLD, DM2, asthma, anxiety, hypothyroidism, CKD stage IIIb, GERD who presented to [facility] ED on 06/07/2024 from home with complaint of abdominal pain associated with nausea and vomiting.  Poor appetite.  Pain is localized to the epigastric region and rated as a 6/10, sharp and constant.  No radiation.  Food exacerbates symptoms.  Denies diarrhea, no fever/chills.  Denies any blood in her stool.  Further denies chest pain, no palpitations, no shortness of breath, no cough, no urinary symptoms.  Last colonoscopy 12/2019 with diverticulosis of the sigmoid colon, otherwise normal.  No family history of colon cancer.  In the ED, temperature 98.1 F, HR 78, RR 18, BP 152/82, SpO2 99% on room air.  WBC 8.5, hemoglobin 10.3, platelet count 322.  Sodium 141, potassium 4.1, chloride 109, CO2 22, glucose 108, BUN 13, creat 1.61.  Alkaline phosphatase 180.  Lipase less than 10.  AST 27, ALT 12, total bilirubin 0.3.  Procalcitonin 0.33.  Lactic acid 2.0.  Vitamin B12 1004 and 76.  Iron 39, TIBC 207, ferritin 142.  Urinalysis with negative leukocytes negative nitrite, rare bacteria, 0-5 WBCs.  CT abdomen/pelvis with contrast with findings of enlarged appendix measuring 1.3 cm in diameter with enhancement and mild adjacent free fluid in the right lower quadrant concerning for mild acute appendicitis, focal circumferential narrowing with slight associated increased soft tissue density/enhancement over the ascending colon in the region of the hepatic flexure concerning for a focal mass/neoplasm, wall thickening of a moderate segment of distal ileum may be due to small bowel enteritis versus infectious versus inflammatory in nature, mild free peritoneal fluid, several liver cyst unchanged, 2.8 cm hyperdense mass over the  upper pole left kidney unchanged compatible with slightly hyperdense cyst, aortic atherosclerosis.  General surgery was consulted.  TRH consulted for admission for further evaluation and management.  Assessment & Plan:   Ascending colon mass, concerning for malignancy Patient presenting with abdominal pain, nausea, vomiting and poor appetite.  Pain localized to the epigastric region.  Colonoscopy 2021 with diverticulosis of the sigmoid colon otherwise unrevealing.  CT abdomen/pelvis on admission with findings enlarged appendix measuring 1.3 cm in diameter with enhancement and mild adjacent free fluid in the right lower quadrant concerning for mild acute appendicitis, focal circumferential narrowing with slight associated increased soft tissue density/enhancement over the ascending colon in the region of the hepatic flexure concerning for a focal mass/neoplasm, wall thickening of a moderate segment of distal ileum may be due to small bowel enteritis versus infectious versus inflammatory in nature, mild free peritoneal fluid.  Colonoscopy 06/11/2024 with findings of a severe ulcerated stricture at the hepatic flexure concerning for malignancy, 4 mm polyp descending colon removed with cold snare.  -- [facility], [facility] following; appreciate assistance -- PICC line placed and started on TPN 11/18 -- Pathology: Pending -- Zofran  as needed nausea/vomiting -- Morphine  2 mg IV every 2 hours as needed severe pain -- N.p.o., general surgery plans laparoscopic assisted partial colectomy with small bowel resection today  Acute appendicitis, ruled out Atypical findings on CT, seen by general surgery and clinically without signs or symptoms of acute appendicitis, suspect area of concern related to above mass and obstructive pathology.  Hypokalemia Repleted, potassium 3.7 this morning. -- Repeat electrolytes in a.m.  Hypophosphatemia  Phosphorus 1.8, pharmacy to replete via TPN  Normocytic anemia Anemia panel  with iron 39, TIBC 207, ferritin 142, vitamin B12 1476.  -- Hgb 10.3>>8.3>8.2>7.9 (6.8 reading this morning likely error as repeat 7.9) -- Transfuse for hemoglobin less than 7.0, suspect will get unit of blood during surgery today -- CBC in a.m.  HTN -- Diltiazem  100 mg p.o. daily -- Metoprolol  succinate 100 mg p.o. daily  HLD -- Crestor  40 mg p.o. daily  DM2 Hemoglobin A1c 5.4. -- Sensitive SSI for coverage -- CBG every 4 hours  CKD stage IIIb Baseline creatinine 1.5-1.9, stable. -- Cr 1.82>1.61>1.38 -- On TPN -- BMP in a.m.  Depression with anxiety -- Celexa  40 mg p.o. daily  Asthma -- Breztri  2 puffs twice daily (substituted for home Trelegy)  GERD -- Protonix  40 mg IV every 24 hours  Obesity, class I Body mass index is 33.74 kg/m.   DVT prophylaxis: SCDs Start: 06/07/24 2000 Place TED hose Start: 06/07/24 2000    Code Status: Full Code Family Communication: No family present at bedside this am  Disposition Plan:  Level of care: Progressive Status is: Inpatient Remains inpatient appropriate because: Pending laparoscopic assisted partial colectomy with small bowel resection today f  Consultants:  [facility] General Surgery  Procedures:  Colonoscopy 11/20 Laparoscopic assisted partial colectomy with small bowel resection, pending today  Antimicrobials:  None   Subjective: Patient seen examined bedside, lying in bed.  Discussed findings of colonoscopy yesterday for concern of obstructive malignancy.  Awaiting colonoscopy this afternoon.  No other specific questions, concerns or complaints at this time.  Denies headache, no visual changes, no chest pain, no palpitations, no shortness of breath, no abdominal pain, no fever/chills/night sweats, no focal weakness, no fatigue, no paresthesia.  No acute events overnight per nursing staff.   Objective: Vitals:   06/12/24 0900 06/12/24 1019 06/12/24 1114 06/12/24 1125  BP:   (!) 146/73   Pulse:    71   Resp:   16   Temp:   98.7 F (37.1 C)   TempSrc:   Oral   SpO2: 94%  97%   Weight:  86.4 kg  86.4 kg  Height:    5' 3 (1.6 m)    Intake/Output Summary (Last 24 hours) at 06/12/2024 1236 Last data filed at 06/12/2024 0600 Gross per 24 hour  Intake 1001.85 ml  Output --  Net 1001.85 ml   Filed Weights   06/07/24 1855 06/12/24 1019 06/12/24 1125  Weight: 80.5 kg 86.4 kg 86.4 kg    Examination:  Physical Exam: GEN: NAD, alert and oriented x 3, obese  HEENT: NCAT, PERRL, EOMI, sclera clear, MMM PULM: CTAB w/o wheezes/crackles, normal respiratory effort, room air CV: RRR w/o M/G/R GI: abd soft, NTND, + BS MSK: no peripheral edema, moves all extremities independently NEURO: No focal neurological deficit PSYCH: normal mood/affect Integumentary: dry/intact, no rashes or wounds    Data Reviewed: I have personally reviewed following labs and imaging studies  CBC: Recent Labs  Lab 06/07/24 1350 06/08/24 0404 06/10/24 0343 06/11/24 0407 06/12/24 0018 06/12/24 0259  WBC 8.5 10.4 7.3 5.1 4.4 6.4  NEUTROABS 7.0  --   --   --   --   --   HGB 10.3* 10.3* 8.3* 8.2* 6.8* 7.9*  HCT 32.2* 32.8* 26.7* 25.4* 21.5* 25.0*  MCV 84.3 86.3 86.4 85.2 86.0 86.5  PLT 322 320 181 172 131* 155   Basic Metabolic Panel: Recent Labs  Lab 06/07/24 1842 06/08/24 0404  06/09/24 0413 06/10/24 0343 06/11/24 0407 06/12/24 0018  NA  --  144 144 140 138 140  K  --  3.7 3.2* 3.1* 3.7 3.7  CL  --  111 112* 108 108 112*  CO2  --  19* 21* 21* 21* 22  GLUCOSE  --  104* 91 123* 97 128*  BUN  --  12 12 17 17 14   CREATININE  --  1.49* 1.47* 1.82* 1.64* 1.38*  CALCIUM   --  8.5* 8.0* 7.8* 7.7* 7.7*  MG 1.6* 2.4  --  2.5* 2.0 1.8  PHOS  --   --   --  1.6* 2.7 1.8*   GFR: Estimated Creatinine Clearance: 38.4 mL/min (A) (by C-G formula based on SCr of 1.38 mg/dL (H)). Liver Function Tests: Recent Labs  Lab 06/07/24 1350 06/08/24 0404 06/10/24 0343 06/11/24 0407 06/12/24 0018  AST 27  21 14* 13* 12*  ALT 12 13 9 9 9   ALKPHOS 180* 176* 127* 115 91  BILITOT 0.3 0.3 <0.2 <0.2 <0.2  PROT 5.7* 5.8* 4.4* 4.5* 4.0*  ALBUMIN  2.9* 2.7* 2.2* 2.2* 2.0*   Recent Labs  Lab 06/07/24 1350  LIPASE <10*   No results for input(s): AMMONIA in the last 168 hours. Coagulation Profile: Recent Labs  Lab 06/08/24 0404  INR 1.2   Cardiac Enzymes: No results for input(s): CKTOTAL, CKMB, CKMBINDEX, TROPONINI in the last 168 hours. BNP (last 3 results) No results for input(s): PROBNP in the last 8760 hours. HbA1C: No results for input(s): HGBA1C in the last 72 hours. CBG: Recent Labs  Lab 06/11/24 1938 06/12/24 0003 06/12/24 0400 06/12/24 0715 06/12/24 1050  GLUCAP 166* 137* 130* 140* 128*   Lipid Profile: No results for input(s): CHOL, HDL, LDLCALC, TRIG, CHOLHDL, LDLDIRECT in the last 72 hours. Thyroid  Function Tests: No results for input(s): TSH, T4TOTAL, FREET4, T3FREE, THYROIDAB in the last 72 hours. Anemia Panel: No results for input(s): VITAMINB12, FOLATE, FERRITIN, TIBC, IRON, RETICCTPCT in the last 72 hours.  Sepsis Labs: Recent Labs  Lab 06/07/24 1841 06/07/24 1916 06/07/24 2100 06/08/24 0404  PROCALCITON 0.33  --   --  0.35  LATICACIDVEN  --  2.0* 1.3  --     No results found for this or any previous visit (from the past 240 hours).       Radiology Studies: No results found.       Scheduled Meds:  [MAR Hold] sodium chloride    Intravenous Once   [MAR Hold] allopurinol   100 mg Oral Daily   [MAR Hold] atropine   1 drop Left Eye Daily   [MAR Hold] azelastine   2 spray Each Nare BID   [MAR Hold] brimonidine   1 drop Left Eye BID   [MAR Hold] budesonide -glycopyrrolate -formoterol   2 puff Inhalation BID   [MAR Hold] Chlorhexidine  Gluconate Cloth  6 each Topical Daily   [MAR Hold] citalopram   40 mg Oral Daily   [MAR Hold] diltiazem   180 mg Oral Daily   [MAR Hold] erythromycin    Left Eye QHS   [MAR  Hold] feeding supplement  1 Container Oral TID BM   insulin  aspart  0-9 Units Subcutaneous Q6H   [MAR Hold] latanoprost   1 drop Left Eye QHS   [MAR Hold] loratadine   10 mg Oral Daily   [MAR Hold] metoprolol  succinate  100 mg Oral Daily   [MAR Hold] pantoprazole  (PROTONIX ) IV  40 mg Intravenous Q24H   [MAR Hold] rosuvastatin   40 mg Oral Daily   [MAR Hold] sodium bicarbonate   1,300 mg Oral Daily   Continuous Infusions:  [MAR Hold] cefoTEtan  (CEFOTAN ) IV     lactated ringers  10 mL/hr at 06/12/24 1141   potassium PHOSPHATE  IVPB (in mmol) 30 mmol (06/12/24 0847)   TPN ADULT (ION) 70 mL/hr at 06/11/24 1711   TPN ADULT (ION)       LOS: 5 days    Time spent: 50 minutes spent on 06/12/2024 caring for this patient face-to-face including chart review, ordering labs/tests, documenting, discussion with nursing staff, consultants, updating family and interview/physical exam    Camellia PARAS Lenah Messenger, DO [facility] Available via Epic secure chat 7am-7pm After these hours, please refer to coverage provider listed on [email or web address] 06/12/2024, 12:36 PM

## 2024-06-12 NOTE — Anesthesia Preprocedure Evaluation (Addendum)
 Anesthesia Evaluation  Patient identified by MRN, date of birth, ID band Patient awake    Reviewed: Allergy & Precautions, NPO status , Patient's Chart, lab work & pertinent test results  Airway Mallampati: I  TM Distance: >3 FB Neck ROM: Full    Dental  (+) Edentulous Upper, Edentulous Lower   Pulmonary asthma , COPD    + decreased breath sounds      Cardiovascular hypertension, Pt. on medications and Pt. on home beta blockers + dysrhythmias Atrial Fibrillation + Valvular Problems/Murmurs  Rhythm:Regular Rate:Normal     Neuro/Psych  Headaches PSYCHIATRIC DISORDERS Anxiety Depression       GI/Hepatic Neg liver ROS,GERD  Medicated,,  Endo/Other  diabetes, Type 2Hypothyroidism    Renal/GU Renal disease     Musculoskeletal   Abdominal   Peds  Hematology   Anesthesia Other Findings   Reproductive/Obstetrics                              Anesthesia Physical Anesthesia Plan  ASA: 3  Anesthesia Plan: General   Post-op Pain Management: Ofirmev  IV (intra-op)*   Induction: Intravenous  PONV Risk Score and Plan: 4 or greater and Ondansetron , Dexamethasone  and Treatment may vary due to age or medical condition  Airway Management Planned: Oral ETT  Additional Equipment: None  Intra-op Plan:   Post-operative Plan: Extubation in OR  Informed Consent: I have reviewed the patients History and Physical, chart, labs and discussed the procedure including the risks, benefits and alternatives for the proposed anesthesia with the patient or authorized representative who has indicated his/her understanding and acceptance.     Dental advisory given  Plan Discussed with: CRNA  Anesthesia Plan Comments:          Anesthesia Quick Evaluation

## 2024-06-12 NOTE — Transfer of Care (Signed)
 Immediate Anesthesia Transfer of Care Note  Patient: Brooke [name]  Procedure(s) Performed: LAPAROSCOPIC ASSISTED RIGHT COLECTOMY (Abdomen)  Patient Location: PACU  Anesthesia Type:General  Level of Consciousness: awake  Airway & Oxygen  Therapy: Patient Spontanous Breathing and Patient connected to nasal cannula oxygen   Post-op Assessment: Report given to RN and Post -op Vital signs reviewed and stable  Post vital signs: Reviewed and stable  Last Vitals:  Vitals Value Taken Time  BP 136/71 06/12/24 16:30  Temp 36.5 C 06/12/24 16:26  Pulse 73 06/12/24 16:31  Resp 20 06/12/24 16:31  SpO2 94 % 06/12/24 16:31  Vitals shown include unfiled device data.  Last Pain:  Vitals:   06/12/24 1626  TempSrc:   PainSc: 9       Patients Stated Pain Goal: 0 (06/10/24 0957)  Complications: No notable events documented.

## 2024-06-12 NOTE — Progress Notes (Signed)
 Nutrition Follow-up  DOCUMENTATION CODES:   Obesity unspecified  INTERVENTION:  - Continue goal TPN.  -TPN management per Pharmacy   - Monitor for diet advancement.  - No weight since initial admission weight.  - Added daily weights while on TPN  NUTRITION DIAGNOSIS:   Inadequate oral intake related to nausea, vomiting as evidenced by per patient/family report. *ongoing  GOAL:   Patient will meet greater than or equal to 90% of their needs *met with TPN  MONITOR:   PO intake (TPN)  REASON FOR ASSESSMENT:   Other (Comment) (TPN)    ASSESSMENT:   73 y.o. female with medical history significant of HTN, HLD, GERD, gout, asthma, anxiety, hypothyroidism, T2DM, CKD stage 3b who presented to ED with complaints of abdominal pain and nausea and vomiting that started a few days prior to admission. Patient had notable CT abdomen pelvis with ascending colonic mass concerning for obstructive pathology.  11/16 Admit 11/18 TPN initiated 11/20 TPN advanced to goal rate; s/p colonoscopy, found to have severe ulcerated stricture at the hepatic flexure-noted to be concerning for malignancy (path pending)  Patient out of room at this time of visit, in the OR.  Son and daughter-in-law at bedside. Discussed that TPN was advanced to goal yesterday so patient receiving 100% estimated needs.   She had previously been on clear liquids. No meal intakes documented but son reports she was drinking [employer] and enjoys it. She is noted to have accepted 3 yesterday.   Patient NPO since midnight for OR today. Per surgery's note today, with issues found yesterday plan to do partial colectomy and small bowel resection (distal ileum).  Suspect patient may remain NPO but TPN to continue at goal.   Admit weight: 177# Current weight: 190# I&O's: +5L since admit  Medications reviewed and include: -  Labs reviewed:  Creatinine 1.38 Phosphorus 1.8   Diet Order:   Diet Order              Diet NPO time specified Except for: Sips with Meds  Diet effective midnight                   EDUCATION NEEDS:  No education needs have been identified at this time  Skin:  Skin Assessment: Reviewed RN Assessment  Last BM:  11/20 - type 7  Height:  Ht Readings from Last 1 Encounters:  06/12/24 5' 3 (1.6 m)   Weight:  Wt Readings from Last 1 Encounters:  06/12/24 86.4 kg   BMI:  Body mass index is 33.74 kg/m.  Estimated Nutritional Needs:  Kcal:  1550-1750 Protein:  75-85g Fluid:  1.7L/day    Trude Ned RD, LDN Contact via Secure Chat.

## 2024-06-12 NOTE — Anesthesia Procedure Notes (Signed)
 Procedure Name: Intubation Date/Time: 06/12/2024 12:38 PM  Performed by: Nanci Riis, CRNAPre-anesthesia Checklist: Patient identified, Emergency Drugs available, Suction available, Patient being monitored and Timeout performed Patient Re-evaluated:Patient Re-evaluated prior to induction Oxygen  Delivery Method: Circle system utilized Preoxygenation: Pre-oxygenation with 100% oxygen  Induction Type: IV induction, Rapid sequence and Cricoid Pressure applied Laryngoscope Size: Miller and 3 Grade View: Grade I Tube type: Oral Tube size: 7.0 mm Number of attempts: 1 Airway Equipment and Method: Stylet Placement Confirmation: ETT inserted through vocal cords under direct vision, positive ETCO2 and breath sounds checked- equal and bilateral Secured at: 21 cm Tube secured with: Tape Dental Injury: Teeth and Oropharynx as per pre-operative assessment

## 2024-06-12 NOTE — Progress Notes (Signed)
 Patient ID: Brooke [name], female   DOB: 1950/09/25, 73 y.o.   MRN: [id number]   Acute Care Surgery Service Progress Note:    Chief Complaint/Subjective: No c/o Had cscopy yesterday - tight stricture at hepatic flexure  Objective: Vital signs in last 24 hours: Temp:  [97.5 F (36.4 C)-99.8 F (37.7 C)] 99.2 F (37.3 C) (11/21 0402) Pulse Rate:  [70-85] 79 (11/21 0402) Resp:  [11-18] 18 (11/20 1424) BP: (120-195)/(53-86) 138/64 (11/21 0402) SpO2:  [94 %-100 %] 94 % (11/21 0900) Last BM Date : 06/11/24  Intake/Output from previous day: 11/20 0701 - 11/21 0700 In: 1001.9 [P.O.:60; I.V.:941.9] Out: -  Intake/Output this shift: No intake/output data recorded.  Lungs: nonlabored  Cardiovascular: reg  Abd: soft, nt, some distension  Extremities: no edema, +SCDs  Neuro: alert, nonfocal  Lab Results: CBC  Recent Labs    06/12/24 0018 06/12/24 0259  WBC 4.4 6.4  HGB 6.8* 7.9*  HCT 21.5* 25.0*  PLT 131* 155   BMET Recent Labs    06/11/24 0407 06/12/24 0018  NA 138 140  K 3.7 3.7  CL 108 112*  CO2 21* 22  GLUCOSE 97 128*  BUN 17 14  CREATININE 1.64* 1.38*  CALCIUM  7.7* 7.7*   LFT    Latest Ref Rng & Units 06/12/2024   12:18 AM 06/11/2024    4:07 AM 06/10/2024    3:43 AM  Hepatic Function  Total Protein 6.5 - 8.1 g/dL 4.0  4.5  4.4   Albumin  3.5 - 5.0 g/dL 2.0  2.2  2.2   AST 15 - 41 U/L 12  13  14    ALT 0 - 44 U/L 9  9  9    Alk Phosphatase 38 - 126 U/L 91  115  127   Total Bilirubin 0.0 - 1.2 mg/dL <9.7  <9.7  <9.7    PT/INR No results for input(s): LABPROT, INR in the last 72 hours. ABG No results for input(s): PHART, HCO3 in the last 72 hours.  Invalid input(s): PCO2, PO2  Studies/Results:  Anti-infectives: Anti-infectives (From admission, onward)    Start     Dose/Rate Route Frequency Ordered Stop   06/12/24 1000  cefoTEtan  (CEFOTAN ) 2 g in sodium chloride  0.9 % 100 mL IVPB        2 g 200 mL/hr over 30 Minutes Intravenous  On call to O.R. 06/12/24 0948 06/13/24 0559       Medications: Scheduled Meds:  sodium chloride    Intravenous Once   allopurinol   100 mg Oral Daily   alvimopan   12 mg Oral On Call to OR   atropine   1 drop Left Eye Daily   azelastine   2 spray Each Nare BID   brimonidine   1 drop Left Eye BID   budesonide -glycopyrrolate -formoterol   2 puff Inhalation BID   Chlorhexidine  Gluconate Cloth  6 each Topical Daily   citalopram   40 mg Oral Daily   diltiazem   180 mg Oral Daily   erythromycin    Left Eye QHS   feeding supplement  1 Container Oral TID BM   heparin  injection (subcutaneous)  5,000 Units Subcutaneous Once   insulin  aspart  0-9 Units Subcutaneous Q4H   latanoprost   1 drop Left Eye QHS   loratadine   10 mg Oral Daily   metoprolol  succinate  100 mg Oral Daily   pantoprazole  (PROTONIX ) IV  40 mg Intravenous Q24H   rosuvastatin   40 mg Oral Daily   sodium bicarbonate   1,300 mg Oral Daily  Continuous Infusions:  cefoTEtan  (CEFOTAN ) IV     potassium PHOSPHATE  IVPB (in mmol) 30 mmol (06/12/24 0847)   TPN ADULT (ION) 70 mL/hr at 06/11/24 1711   PRN Meds:.acetaminophen  **OR** acetaminophen , alum & mag hydroxide-simeth, metoprolol  tartrate, morphine  injection, ondansetron  **OR** ondansetron  (ZOFRAN ) IV, sodium chloride  flush, traZODone   Assessment/Plan: Patient Active Problem List   Diagnosis Date Noted   Benign neoplasm of descending colon 06/11/2024   Ileitis 06/09/2024   Colonic stricture (HCC) 06/08/2024   abdominal pain with nausea and vomiting and findings concerning for focal mass in ascedning colon 06/07/2024   Appendix disease 06/07/2024   PAT (paroxysmal atrial tachycardia)    Metabolic acidosis 05/17/2024   Hypophosphatemia 05/15/2024   Diarrhea 05/14/2024   Hypomagnesemia 05/13/2024   Renal tubular acidosis 05/13/2024   History of essential hypertension 05/13/2024   Moderate persistent asthma 05/13/2024   Other fatigue 05/12/2024   Heart murmur 05/12/2024   Class 1  obesity due to excess calories with body mass index (BMI) of 31.0 to 31.9 in adult 05/05/2024   Small bowel obstruction (HCC) 05/02/2024   Hypernatremia 05/02/2024   Murmur, cardiac 05/01/2024   Abnormal echocardiogram 05/01/2024   Tachycardia 05/01/2024   Ileus (HCC) 04/28/2024   Abdominal pain 04/28/2024   Abnormal CT of the abdomen 04/28/2024   Nausea & vomiting 04/15/2024   COVID-19 virus infection 04/15/2024   Nausea and vomiting 04/15/2024   Dizziness 04/03/2024   Pneumonia 02/27/2024   UTI (urinary tract infection) 02/27/2024   Pancreatic lesion 02/27/2024   Cerumen impaction 02/27/2024   AKI (acute kidney injury) 02/20/2024   ARF (acute renal failure) 02/18/2024   Scalp pain 06/08/2023   Significant closed head trauma within past 3 months 02/25/2023   Chronic cough 02/25/2023   COPD (chronic obstructive pulmonary disease) (HCC) 09/08/2022   Pseudogout of left wrist 08/08/2022   Generalized weakness 05/18/2022   Left ear impacted cerumen 05/16/2022   Left kidney mass 01/14/2022   Peripheral edema 01/14/2022   Left ear hearing loss 12/05/2021   Nasal crusting 12/04/2021   Left ear pain 08/29/2021   Grief 06/05/2021   Acute shoulder bursitis, left 06/05/2021   Bilateral shoulder pain 03/05/2020   Vitamin D  deficiency 12/01/2019   Allergic rhinitis 06/16/2019   Asthma exacerbation 06/16/2019   Dyspnea 01/27/2019   Primary osteoarthritis of left knee 09/30/2018   CKD stage 3b, GFR 30-44 ml/min (HCC) 07/25/2018   Chronic back pain 07/25/2018   Primary osteoarthritis of right knee 05/05/2018   Osteoarthritis of right knee 04/30/2018   Degenerative arthritis of knee, bilateral 08/07/2017   Depression with anxiety 07/19/2017   Vertigo 01/12/2017   Low back pain 01/12/2017   SOB (shortness of breath) 12/02/2016   Dysuria 09/12/2016   Bilateral knee pain 02/15/2015   Effusion of right knee 01/28/2015   Asthma with exacerbation 11/09/2014   Pronation deformity of both  feet 09/29/2014   Greater trochanteric bursitis of left hip 08/31/2014   Degenerative arthritis of left knee 05/18/2014   Ventricular tachycardia, polymorphic (HCC) 01/04/2014   Abnormal MRI of head 12/04/2013   Syncope 11/06/2013   Chest pain 11/06/2013   Headache 11/06/2013   Nausea with vomiting 11/06/2013   Hypokalemia 08/18/2012   Diastolic dysfunction 01/15/2011   Leukocytosis 01/03/2011   Toe pain 12/20/2010   Encounter for well adult exam with abnormal findings 12/19/2010   FATIGUE 09/05/2009   DM2 (diabetes mellitus, type 2) (HCC) 11/29/2008   Gout 08/09/2008   Normochromic normocytic anemia 02/09/2008  GERD (gastroesophageal reflux disease) 02/09/2008   ALOPECIA 12/30/2007   Hyperlipidemia 10/01/2007   Essential hypertension 02/26/2007   History of colonic polyps 02/26/2007   Goiter 09/19/2006   Asthma 09/19/2006   ECZEMA, ATOPIC DERMATITIS 09/19/2006   Insomnia 09/19/2006   73 year old femal having abd pain/nausea/vomiting with concerns of hepatic flexure stricture, terminal ileitis and a liver cyst. Dilation of appendix also noted.  -CT showed enlarged appendix measuring 1.3 cm in diameter with mucosal enhancement and mild adjacent free fluid in the right lower quadrant. Findings are concerning for mild acute appendicitis. Focal circumferential narrowing with slight associated increased soft tissue density/enhancement over the ascending colon in the region of the hepatic flexure. This is concerning for a focal mass/neoplasm. Consider colonoscopy for further evaluation. Wall thickening of a moderate segment of the distal ileum. Findings may be due to small bowel enteritis of infectious or inflammatory nature. Mild free peritoneal fluid. Several liver cysts unchanged. 2.8 cm hypodense mass over the upper pole left kidney unchanged compatible with slightly hyperdense cyst. Aortic atherosclerosis. -Afebrile. -hgb down a little 7.9 -No pain, nausea, or vomiting. Having bowel  function.  -PICC/TPN initiated 11/18. -GI consulted.  colonoscopy  11/20 showed tight stricture at hepatic flexure  Given the location of the stricture and being confirmed at the hepatic flexure I do not think this is going to be a safe area to dilate.  I reviewed the case with Dr. Debby as well as discussed it with the gastroenterologist Dr. Stacia.  Would be at risk for perforation given the fact that it is at the hepatic flexure.  Therefore I think the best option is resection of the right colon and distal ileum.  We will need to remove all of the inflamed ileum which does potentially put the patient at some volume deficiencies long-term.  Given how rapidly the stricture happened I think this is probably more consistent with an underlying inflammatory bowel disease than underlying malignancy but nonetheless this area needs to come out since there is not another way to manage her hepatic flexure stricture.  Discussed the case and situation with the patient and her son on the telephone.  Recommended a laparoscopic assisted partial colectomy with small bowel resection.  I discussed the procedure in detail.    We discussed the risks and benefits of surgery including, but not limited to bleeding, infection (such as wound infection, abdominal abscess), injury to surrounding structures, blood clot formation, urinary retention, incisional hernia, anastomotic stricture, anastomotic leak, anesthesia risks, pulmonary & cardiac complications such as pneumonia &/or heart attack, need for additional procedures, ileus, & prolonged hospitalization.  We discussed the typical postoperative recovery course, including limitations & restrictions postoperatively. I explained that the likelihood of improvement in their symptoms is good.  We did discuss that she is at increased risk for leak because of her nutritional status.  We also discussed the potential need for transfusion of blood since her hemoglobin is around 7.9.   We discussed that should she have a major postoperative complication then she would be at risk for other organ systems to become infected and shut down increasing her risk of mortality.  They understand this and would like to proceed with surgery.  We also discussed her CODE STATUS.  At this time she would like to remain a full code   -Will continue to follow.     FEN: NPO; IVF per primary team for electrolyte abnormalities VTE: None currently ID: None currently  LOS: 4 days    I reviewed specialist notes, consulting provider notes, hospitalist notes, last 24 h vitals and pain scores, last 48 h intake and output, last 24 h labs and trends, and last 24 h imaging results.   This care required high level of medical decision making. Disposition: OR today- iv abx on call, preop subcu heparin , entereg   LOS: 5 days    Camellia HERO. Tanda, MD, FACS General, Bariatric, & Minimally Invasive Surgery [phone] [facility], [facility]

## 2024-06-12 NOTE — Op Note (Signed)
 06/07/2024 - 06/12/2024  3:48 PM  PATIENT:  Brooke [name]  73 y.o. female  PRE-OPERATIVE DIAGNOSIS: Hepatic flexure stricture with abnormal thickened ileum  POST-OPERATIVE DIAGNOSIS: Same  PROCEDURE:  Procedure(s): LAPAROSCOPIC ASSISTED  RIGHT COLECTOMY WITH EXTENDED RESECTION OF DISTAL ILEUM Laparoscopic lysis of adhesions for 20-minute  SURGEON:  Surgeon(s): Tanda Locus, MD  ASSISTANTS: Burnard Banter PA-C   ANESTHESIA:   general  EBL:  BLOOD: 1U PRBC  FINDINGS: Distal ileum felt chronically thickened and inflamed; mesentery felt normal although a little bit friable; there is definitely a transition in bowel wall thickness in the mid ileum; the hepatic flexure was densely adhered to the liver as well as to the mesentery of the C portion of the duodenum almost as if there might have been a contained perforation.  DRAINS: Nasogastric Tube and Urinary Catheter (Foley)   LOCAL MEDICATIONS USED:  MARCAINE      SPECIMEN:  Source of Specimen:  Right colon and distal ileum  DISPOSITION OF SPECIMEN:  PATHOLOGY  COUNTS:  YES  INDICATION FOR PROCEDURE: Patient is a 73 year old female who has had ascending colitis for over 8 weeks h with several CT scans showing not only her ascending colitis on imaging but also thickened ileum.  Most recently she was admitted for worsening symptoms and on this admission CT she was noted to have a new apparent stricture involving her ascending colon of unclear etiology along with persistent bowel wall thickening of the long segment of the distal ileum.  She was able to tolerate a bowel prep and undergo a colonoscopy and there was a severe stricture at the hepatic flexure that was not able to be traversed.  It was not felt to be a candidate for dilation because of its location at the hepatic flexure.  There was concern of potential either malignancy versus a more probable inflammatory bowel disease.  We recommended resection involving the abnormal  appearing bowel.  Risk and benefits were separately documented and discussed  PROCEDURE: The patient received subcutaneous heparin  preoperatively.  She also received Entereg  preoperatively.  She was taken to the OR for at [facility] and placed upon on the operating room table.  General endotracheal anesthesia was established.  Sequential compression devices were placed.  A Foley catheter was placed.  Her left arm was tucked with the appropriate padding.  Her abdomen was prepped and draped in usual standard surgical fashion with ChloraPrep.  A surgical timeout was performed.  Access to the abdomen was obtained using the Optiview technique in the left upper quadrant at Palmer's point.  A 0 degree 5 mm laparoscope through a 5 mm trocar was carefully advanced through the abdominal wall and in the abdominal cavity was entered.  Pneumoperitoneum was smoothly established up to a patient pressure of 15 mmHg.  No change in patient vital signs.  Laparoscope was advanced and abdominal cavity was surveilled.  There was no evidence of injury to surrounding viscera.  The patient had had a prior lower midline incision.  She had omentum adhered to the umbilicus all the way down to the pelvis.  An additional left lower quadrant trocar was placed under direct visualization.  I started taking down the omental attachments from the anterior abdominal wall with harmonic scalpel.  Once I had freed some of the adhesions I placed a 5 mm trocar in the infraumbilical position to aid with retraction of the omentum to remove finish taking it off the anterior abdominal wall with harmonic scalpel.  Took  approximately 20 minutes to lyse these adhesions.  The cecum and appendix and terminal ileum were visualized.  The appendix appeared chronically dilated but did not appear acutely inflamed.  There is no overt evidence of creeping fat per se.  I started mobilizing the appendix from the pelvic inlet with harmonic scalpel.  I started  taking down the lateral attachments of the cecum and the ascending colon with harmonic scalpel.  The veil of the small bowel mesentery was incised at the pelvic inlet with harmonic scalpel in order to medialize the distal small bowel.  We then placed the patient in reverse Trendelenburg.  I lifted the omentum up into the upper abdomen and identified the transverse colon.  I started taking some of the omentum off the transverse colon in the midportion with harmonic scalpel.  I started working my way toward the hepatic flexure.  The patient had omental adhesions to the right lobe of the liver.  The patient had had previous cholecystectomy and there was a fair amount of it adhesions of omentum to the old gallbladder fossa this was taken down with harmonic scalpel.  The hepatic flexure was quite stuck in this location.  It was unclear the exact location of the duodenum.  I took down some additional lateral attachments of the ascending colon.  But I was not making much progress at this point with the hepatic flexure because I could not visualize the duodenum.  Therefore I decided to convert to an open procedure  Incision was made slightly above the umbilicus and carried superiorly toward the xiphoid.  Deep dermis was divided electrocautery.  The fascia incised and the abdominal cavity was entered.  A wound protector was placed.  I initially placed Balfour retractor but given the dense adhesions in the right upper quadrant involving the hepatic flexure I switched to a Bookwalter retractor.  I decided to transect and divide the ileum and work up toward the hepatic flexure given how adhered it was at the hepatic flexure.  Her preoperative imaging did demonstrated fairly moderate bowel wall thickening of the long segment of the ileum.  This corresponded with intra-abdominal findings.  The terminal ileum and distal ileum was very thick-walled.  We palpated back and ran it back proximately 2 weeks appreciated which appeared  to be a change in bowel wall caliber and thickness.  The small bowel was divided with an Dealer with a 60 mm blue load.  We did taking down the right colon mesentery sequentially with LigaSure device.  2-0 silk sutures were placed along the ileocolonic pedicle.  I identified a portion of the transverse colon and lifted the omentum off of it with electrocautery as well as with LigaSure device.  I divided the transverse colon just proximal to the middle colonic vessel with a 60 mm blue load of the Ethicon Echelon stapler.  I then took down some of that proximal colon mesentery sequentially with LigaSure device going toward the hepatic flexure.  I then turned my attention back toward the hepatic flexure.  I continued taking down some of the hepatic flexure carefully with right angle and Bovie electrocautery as well as LigaSure device.  The hepatic flexure was densely adhered to the medial portion of the duodenum along the C portion of the duodenum along its inner mesentery of the duodenum.  I was able to essentially finger fracture this off the duodenal mesentery.  There was a little bit of bleeding from the duodenum mesentery along the C portion  of the duodenum.  This was controlled with 3 interrupted 2-0 silk sutures.  I did not take deep bites.  This ended up completely freeing the hepatic flexure.  At this point the right colon and distal ileum were completely freed and passed off the field.  We inspected the right upper quadrant for hemostasis.  There was no evidence of additional bleeding.  I then brought the ileum up to the transverse colon to create a side-to-side functional end-to-end anastomosis.  Prior to making enterotomies we gave the patient ICG dye and use the open abdominal Stryker fluorescent camera system to visualize bowel perfusion.  There was excellent perfusion of the small bowel and the transverse colon.  An enterotomy was made just proximal to the ileum staple line and a  colotomy was made just proximal to the transverse colon staple line.  1 limb of the Ethicon Echelon 60 mm stapler with a blue load was placed through the enterotomy and the other through the colotomy the stay was brought together and fired to grade a common channel.  The common defect was then closed with multiple interrupted 2-0 silk sutures.  I then reinforced the corners of the staple line with 3-0 silk sutures in a Lembert fashion.  I then took a tongue of omentum and laid it over the common closure with several 3-0 silk sutures as a patch.  A 3-0 silk suture was placed in the crotch of the anastomosis.  The abdomen was irrigated.  We placed some Surgicel powder over the mesentery of the duodenum.  Viscera was returned to the abdomen.  We then went to clean protocol.  The wound protector and Bookwalter were removed along with the current irrigation and electrocautery and surgical instruments.  We changed gown and gloves and redraped the patient and got a new clean closing tray.  The fascia was closed with a #1 looped PDS 1 from above and 1 from below with 5 interrupted #1 Novafil suture sutures placed as internal retention sutures.  The subcutaneous tissue was irrigated and the skin was closed with skin staples.  The 3 trocar sites were closed with skin staples as well.  A honeycomb dressing was applied.  All needle, instrument, and sponge counts were correct x 2.  There were no immediate complications.  The patient tolerated the procedure well.  PLAN OF CARE: ALREADY INPATIENT  PATIENT DISPOSITION:  PACU - hemodynamically stable.   Delay start of Pharmacological VTE agent (>24hrs) due to surgical blood loss or risk of bleeding:  no  Camellia HERO. Tanda, MD, FACS General, Bariatric, & Minimally Invasive Surgery [facility], [facility]

## 2024-06-13 DIAGNOSIS — K6389 Other specified diseases of intestine: Secondary | ICD-10-CM | POA: Diagnosis not present

## 2024-06-13 LAB — COMPREHENSIVE METABOLIC PANEL WITH GFR
ALT: 11 U/L (ref 0–44)
AST: 15 U/L (ref 15–41)
Albumin: 2.4 g/dL — ABNORMAL LOW (ref 3.5–5.0)
Alkaline Phosphatase: 60 U/L (ref 38–126)
Anion gap: 7 (ref 5–15)
BUN: 18 mg/dL (ref 8–23)
CO2: 22 mmol/L (ref 22–32)
Calcium: 7.6 mg/dL — ABNORMAL LOW (ref 8.9–10.3)
Chloride: 110 mmol/L (ref 98–111)
Creatinine, Ser: 1.3 mg/dL — ABNORMAL HIGH (ref 0.44–1.00)
GFR, Estimated: 43 mL/min — ABNORMAL LOW (ref >60)
Glucose, Bld: 220 mg/dL — ABNORMAL HIGH (ref 70–99)
Potassium: 4.7 mmol/L (ref 3.5–5.1)
Sodium: 138 mmol/L (ref 135–145)
Total Bilirubin: <0.2 mg/dL (ref 0.0–1.2)
Total Protein: 4.1 g/dL — ABNORMAL LOW (ref 6.5–8.1)

## 2024-06-13 LAB — CBC
HCT: 20.3 % — ABNORMAL LOW (ref 36.0–46.0)
HCT: 26 % — ABNORMAL LOW (ref 36.0–46.0)
Hemoglobin: 6.4 g/dL — CL (ref 12.0–15.0)
Hemoglobin: 8.8 g/dL — ABNORMAL LOW (ref 12.0–15.0)
MCH: 27 pg (ref 26.0–34.0)
MCH: 28.8 pg (ref 26.0–34.0)
MCHC: 31.5 g/dL (ref 30.0–36.0)
MCHC: 33.8 g/dL (ref 30.0–36.0)
MCV: 85.7 fL (ref 80.0–100.0)
MCV: 85 fL (ref 80.0–100.0)
Platelets: 141 K/uL — ABNORMAL LOW (ref 150–400)
Platelets: 135 K/uL — ABNORMAL LOW (ref 150–400)
RBC: 2.37 MIL/uL — ABNORMAL LOW (ref 3.87–5.11)
RBC: 3.06 MIL/uL — ABNORMAL LOW (ref 3.87–5.11)
RDW: 18.1 % — ABNORMAL HIGH (ref 11.5–15.5)
RDW: 15.9 % — ABNORMAL HIGH (ref 11.5–15.5)
WBC: 12.2 K/uL — ABNORMAL HIGH (ref 4.0–10.5)
WBC: 17.4 K/uL — ABNORMAL HIGH (ref 4.0–10.5)
nRBC: 0.2 % (ref 0.0–0.2)
nRBC: 0.2 % (ref 0.0–0.2)

## 2024-06-13 LAB — PHOSPHORUS: Phosphorus: 2.9 mg/dL (ref 2.5–4.6)

## 2024-06-13 LAB — MAGNESIUM: Magnesium: 1.6 mg/dL — ABNORMAL LOW (ref 1.7–2.4)

## 2024-06-13 LAB — GLUCOSE, CAPILLARY
Glucose-Capillary: 146 mg/dL — ABNORMAL HIGH (ref 70–99)
Glucose-Capillary: 175 mg/dL — ABNORMAL HIGH (ref 70–99)
Glucose-Capillary: 197 mg/dL — ABNORMAL HIGH (ref 70–99)
Glucose-Capillary: 203 mg/dL — ABNORMAL HIGH (ref 70–99)
Glucose-Capillary: 282 mg/dL — ABNORMAL HIGH (ref 70–99)

## 2024-06-13 LAB — HEMOGLOBIN AND HEMATOCRIT, BLOOD
HCT: 22 % — ABNORMAL LOW (ref 36.0–46.0)
Hemoglobin: 7.4 g/dL — ABNORMAL LOW (ref 12.0–15.0)

## 2024-06-13 LAB — PREPARE RBC (CROSSMATCH)

## 2024-06-13 LAB — CEA: CEA: 2.5 ng/mL (ref 0.0–4.7)

## 2024-06-13 MED ORDER — SODIUM CHLORIDE 0.9% IV SOLUTION
Freq: Once | INTRAVENOUS | Status: AC
  Administered 2024-06-13: 50 mL via INTRAVENOUS

## 2024-06-13 MED ORDER — TRAVASOL 10 % IV SOLN
INTRAVENOUS | Status: AC
  Filled 2024-06-13: qty 840

## 2024-06-13 MED ORDER — MAGNESIUM SULFATE 4 GM/100ML IV SOLN
4.0000 g | Freq: Once | INTRAVENOUS | Status: AC
  Administered 2024-06-13: 4 g via INTRAVENOUS
  Filled 2024-06-13: qty 100

## 2024-06-13 MED ORDER — SODIUM CHLORIDE 0.9% IV SOLUTION: Freq: Once | INTRAVENOUS | Status: DC

## 2024-06-13 NOTE — Progress Notes (Addendum)
 PROGRESS NOTE    Brooke [name]  FMW:[id number] DOB: 08/08/1950 DOA: 06/07/2024 PCP: Norleen Lynwood ORN, MD    Brief Narrative:   Brooke [name] is a 73 y.o. female with past medical history significant for HTN, HLD, DM2, asthma, anxiety, hypothyroidism, CKD stage IIIb, GERD who presented to [facility] ED on 06/07/2024 from home with complaint of abdominal pain associated with nausea and vomiting.  Poor appetite.  Pain is localized to the epigastric region and rated as a 6/10, sharp and constant.  No radiation.  Food exacerbates symptoms.  Denies diarrhea, no fever/chills.  Denies any blood in her stool.  Further denies chest pain, no palpitations, no shortness of breath, no cough, no urinary symptoms.  Last colonoscopy 12/2019 with diverticulosis of the sigmoid colon, otherwise normal.  No family history of colon cancer.  In the ED, temperature 98.1 F, HR 78, RR 18, BP 152/82, SpO2 99% on room air.  WBC 8.5, hemoglobin 10.3, platelet count 322.  Sodium 141, potassium 4.1, chloride 109, CO2 22, glucose 108, BUN 13, creat 1.61.  Alkaline phosphatase 180.  Lipase less than 10.  AST 27, ALT 12, total bilirubin 0.3.  Procalcitonin 0.33.  Lactic acid 2.0.  Vitamin B12 1004 and 76.  Iron 39, TIBC 207, ferritin 142.  Urinalysis with negative leukocytes negative nitrite, rare bacteria, 0-5 WBCs.  CT abdomen/pelvis with contrast with findings of enlarged appendix measuring 1.3 cm in diameter with enhancement and mild adjacent free fluid in the right lower quadrant concerning for mild acute appendicitis, focal circumferential narrowing with slight associated increased soft tissue density/enhancement over the ascending colon in the region of the hepatic flexure concerning for a focal mass/neoplasm, wall thickening of a moderate segment of distal ileum may be due to small bowel enteritis versus infectious versus inflammatory in nature, mild free peritoneal fluid, several liver cyst unchanged, 2.8 cm hyperdense mass over the  upper pole left kidney unchanged compatible with slightly hyperdense cyst, aortic atherosclerosis.  General surgery was consulted.  TRH consulted for admission for further evaluation and management.  Assessment & Plan:   Ascending colon mass, concerning for malignancy s/p lap assisted right colectomy with extended resection distal ileum, LOA Patient presenting with abdominal pain, nausea, vomiting and poor appetite.  Pain localized to the epigastric region.  Colonoscopy 2021 with diverticulosis of the sigmoid colon otherwise unrevealing.  CT abdomen/pelvis on admission with findings enlarged appendix measuring 1.3 cm in diameter with enhancement and mild adjacent free fluid in the right lower quadrant concerning for mild acute appendicitis, focal circumferential narrowing with slight associated increased soft tissue density/enhancement over the ascending colon in the region of the hepatic flexure concerning for a focal mass/neoplasm, wall thickening of a moderate segment of distal ileum may be due to small bowel enteritis versus infectious versus inflammatory in nature, mild free peritoneal fluid.  Colonoscopy 06/11/2024 with findings of a severe ulcerated stricture at the hepatic flexure concerning for malignancy, 4 mm polyp descending colon removed with cold snare.  Patient underwent laparoscopic-assisted right colectomy with extended resection of distal ileum and LOA by Dr. Tanda on 11/21. -- [facility], [facility] following; appreciate assistance -- Pathology: Pending -- Zofran  as needed nausea/vomiting -- Morphine  2 mg IV every 2 hours as needed severe pain -- N.p.o., on TPN  Acute appendicitis, ruled out Atypical findings on CT, seen by general surgery and clinically without signs or symptoms of acute appendicitis, suspect area of concern related to above mass and obstructive pathology.  Hypokalemia Repleted, potassium  4.7 this morning. -- Repeat electrolytes in  a.m.  Hypophosphatemia Repleted  Hypomagnesemia Magnesium  1.6, will replete.  Acute on chronic normocytic anemia Acute postoperative blood loss anemia Anemia panel with iron 39, TIBC 207, ferritin 142, vitamin B12 1476.  -- Hgb 10.3>>8.3>8.2>7.9>8.7>6.4>8.8 -- Transfused 2 units PRBC (11/21 and 11/22) -- H&H every 6 hours -- Transfuse for hemoglobin less than 7.0 -- CBC in a.m.  HTN -- Diltiazem  100 mg p.o. daily -- Metoprolol  succinate 100 mg p.o. daily  HLD -- Crestor  40 mg p.o. daily  DM2 Hemoglobin A1c 5.4. -- Sensitive SSI for coverage -- CBG every 4 hours  CKD stage IIIb Baseline creatinine 1.5-1.9, stable. -- Cr 1.82>1.61>1.38>1.30 -- On TPN -- BMP in a.m.  Depression with anxiety -- Celexa  40 mg p.o. daily  Asthma -- Breztri  2 puffs twice daily (substituted for home Trelegy)  GERD -- Protonix  40 mg IV every 24 hours  Obesity, class I Body mass index is 34.05 kg/m.   DVT prophylaxis: enoxaparin  (LOVENOX ) injection 40 mg Start: 06/13/24 1000 SCDs Start: 06/07/24 2000 Place TED hose Start: 06/07/24 2000    Code Status: Full Code Family Communication: No family present at bedside this am  Disposition Plan:  Level of care: Progressive Status is: Inpatient Remains inpatient appropriate because: Pending laparoscopic assisted partial colectomy with small bowel resection today f  Consultants:  [facility] General Surgery  Procedures:  Colonoscopy 11/20 Laparoscopic assisted partial colectomy with small bowel resection, pending today  Antimicrobials:  None   Subjective: Patient seen examined bedside, lying in bed.  Discussed findings of colonoscopy yesterday for concern of obstructive malignancy.  Awaiting colonoscopy this afternoon.  No other specific questions, concerns or complaints at this time.  Denies headache, no visual changes, no chest pain, no palpitations, no shortness of breath, no abdominal pain, no fever/chills/night  sweats, no focal weakness, no fatigue, no paresthesia.  No acute events overnight per nursing staff.   Objective: Vitals:   06/13/24 0509 06/13/24 0539 06/13/24 0742 06/13/24 1342  BP: 117/63 (!) 147/70  (!) 140/77  Pulse: 77 79 78 80  Resp: 16 16 18 18   Temp: 98.8 F (37.1 C) 98.9 F (37.2 C)  98.4 F (36.9 C)  TempSrc: Oral Oral  Oral  SpO2: 93% 97%  96%  Weight: 87.2 kg     Height:        Intake/Output Summary (Last 24 hours) at 06/13/2024 1343 Last data filed at 06/13/2024 1006 Gross per 24 hour  Intake 4163.84 ml  Output 1300 ml  Net 2863.84 ml   Filed Weights   06/12/24 1019 06/12/24 1125 06/13/24 0509  Weight: 86.4 kg 86.4 kg 87.2 kg    Examination:  Physical Exam: GEN: NAD, alert and oriented x 3, obese  HEENT: NCAT, PERRL, EOMI, sclera clear, MMM, NG tube noted PULM: CTAB w/o wheezes/crackles, normal respiratory effort, room air CV: RRR w/o M/G/R GI: abd soft, NTND, + BS GU: Foley catheter noted with clear yellow urine in collection bag MSK: no peripheral edema, moves all extremities independently NEURO: No focal neurological deficit PSYCH: normal mood/affect Integumentary: dry/intact, no rashes or wounds    Data Reviewed: I have personally reviewed following labs and imaging studies  CBC: Recent Labs  Lab 06/07/24 1350 06/08/24 0404 06/11/24 0407 06/12/24 0018 06/12/24 0259 06/12/24 2006 06/13/24 0402 06/13/24 1235  WBC 8.5   < > 5.1 4.4 6.4  --  12.2* 17.4*  NEUTROABS 7.0  --   --   --   --   --   --   --  HGB 10.3*   < > 8.2* 6.8* 7.9* 8.7* 6.4* 8.8*  HCT 32.2*   < > 25.4* 21.5* 25.0* 27.4* 20.3* 26.0*  MCV 84.3   < > 85.2 86.0 86.5  --  85.7 85.0  PLT 322   < > 172 131* 155  --  141* 135*   < > = values in this interval not displayed.   Basic Metabolic Panel: Recent Labs  Lab 06/08/24 0404 06/09/24 0413 06/10/24 0343 06/11/24 0407 06/12/24 0018 06/13/24 0402  NA 144 144 140 138 140 138  K 3.7 3.2* 3.1* 3.7 3.7 4.7  CL 111  112* 108 108 112* 110  CO2 19* 21* 21* 21* 22 22  GLUCOSE 104* 91 123* 97 128* 220*  BUN 12 12 17 17 14 18   CREATININE 1.49* 1.47* 1.82* 1.64* 1.38* 1.30*  CALCIUM  8.5* 8.0* 7.8* 7.7* 7.7* 7.6*  MG 2.4  --  2.5* 2.0 1.8 1.6*  PHOS  --   --  1.6* 2.7 1.8* 2.9   GFR: Estimated Creatinine Clearance: 40.9 mL/min (A) (by C-G formula based on SCr of 1.3 mg/dL (H)). Liver Function Tests: Recent Labs  Lab 06/08/24 0404 06/10/24 0343 06/11/24 0407 06/12/24 0018 06/13/24 0402  AST 21 14* 13* 12* 15  ALT 13 9 9 9 11   ALKPHOS 176* 127* 115 91 60  BILITOT 0.3 <0.2 <0.2 <0.2 <0.2  PROT 5.8* 4.4* 4.5* 4.0* 4.1*  ALBUMIN  2.7* 2.2* 2.2* 2.0* 2.4*   Recent Labs  Lab 06/07/24 1350  LIPASE <10*   No results for input(s): AMMONIA in the last 168 hours. Coagulation Profile: Recent Labs  Lab 06/08/24 0404  INR 1.2   Cardiac Enzymes: No results for input(s): CKTOTAL, CKMB, CKMBINDEX, TROPONINI in the last 168 hours. BNP (last 3 results) No results for input(s): PROBNP in the last 8760 hours. HbA1C: No results for input(s): HGBA1C in the last 72 hours. CBG: Recent Labs  Lab 06/12/24 1632 06/12/24 1825 06/13/24 0015 06/13/24 0544 06/13/24 1202  GLUCAP 131* 195* 282* 203* 197*   Lipid Profile: No results for input(s): CHOL, HDL, LDLCALC, TRIG, CHOLHDL, LDLDIRECT in the last 72 hours. Thyroid  Function Tests: No results for input(s): TSH, T4TOTAL, FREET4, T3FREE, THYROIDAB in the last 72 hours. Anemia Panel: No results for input(s): VITAMINB12, FOLATE, FERRITIN, TIBC, IRON, RETICCTPCT in the last 72 hours.  Sepsis Labs: Recent Labs  Lab 06/07/24 1841 06/07/24 1916 06/07/24 2100 06/08/24 0404  PROCALCITON 0.33  --   --  0.35  LATICACIDVEN  --  2.0* 1.3  --     Recent Results (from the past 240 hours)  Surgical pcr screen     Status: Abnormal   Collection Time: 06/12/24 10:55 AM   Specimen: Nasal Mucosa; Nasal Swab  Result  Value Ref Range Status   MRSA, PCR POSITIVE (A) NEGATIVE Final    Comment: RESULT CALLED TO, READ BACK BY AND VERIFIED WITH:  TANDA, E 06/12/2024 1257 AJ    Staphylococcus aureus POSITIVE (A) NEGATIVE Final    Comment: (NOTE) The Xpert SA Assay (FDA approved for NASAL specimens in patients 12 years of age and older), is one component of a comprehensive surveillance program. It is not intended to diagnose infection nor to guide or monitor treatment. Performed at [facility], [address] [place], [place] [address]          Radiology Studies: No results found.       Scheduled Meds:  sodium chloride    Intravenous Once  allopurinol   100 mg Oral Daily   alvimopan   12 mg Oral BID   atropine   1 drop Left Eye Daily   azelastine   2 spray Each Nare BID   brimonidine   1 drop Left Eye BID   budesonide -glycopyrrolate -formoterol   2 puff Inhalation BID   Chlorhexidine  Gluconate Cloth  6 each Topical Daily   citalopram   40 mg Oral Daily   diltiazem   180 mg Oral Daily   enoxaparin  (LOVENOX ) injection  40 mg Subcutaneous Q24H   erythromycin    Left Eye QHS   insulin  aspart  0-9 Units Subcutaneous Q6H   latanoprost   1 drop Left Eye QHS   loratadine   10 mg Oral Daily   metoprolol  succinate  100 mg Oral Daily   mupirocin  ointment  1 Application Nasal BID   pantoprazole  (PROTONIX ) IV  40 mg Intravenous Q24H   rosuvastatin   40 mg Oral Daily   sodium bicarbonate   1,300 mg Oral Daily   Continuous Infusions:  acetaminophen  1,000 mg (06/13/24 0812)   TPN ADULT (ION) 70 mL/hr at 06/13/24 0600   TPN ADULT (ION)       LOS: 6 days    Time spent: 50 minutes spent on 06/13/2024 caring for this patient face-to-face including chart review, ordering labs/tests, documenting, discussion with nursing staff, consultants, updating family and interview/physical exam    Camellia PARAS Modesty Rudy, DO [facility] Available via Epic secure chat 7am-7pm After these hours,  please refer to coverage provider listed on [email or web address] 06/13/2024, 1:43 PM

## 2024-06-13 NOTE — Progress Notes (Deleted)
 PHARMACY - TOTAL PARENTERAL NUTRITION CONSULT NOTE   Indication: Planned SBR  Patient Measurements: Height: 5' 3 (160 cm) Weight: 87.2 kg (192 lb 3.9 oz) IBW/kg (Calculated) : 52.4 TPN AdjBW (KG): 60.9 Body mass index is 34.05 kg/m. Usual Weight:   Assessment: Abdominal pain, NV, unable to keep anything down; concerns for hepatic flexure stricture, terminal ileitis and a liver mass. Dilation of appendix also noted concerning for mild acute appendicitis Will need colonoscopy and possible surgical intervention. Pharmacy consulted for TPN management.  Glucose / Insulin : +DM not currently on any treatment PTA - CBGs 131-282 in last 24hrs, got dexamethasone  10 mg x 1 preop 11/21> CBGs elevated after steroid given - 11 units of insulin  used in the last 24hrs Electrolytes: K up to 4.7 & phos up to 2.9 after 30 mMol Kphos yesterday. Mag low at 1.6; Cl 112 > 110,  All others WNL, including CoCa 8.88 Renal: CKD3 with Scr 1.30  Hepatic: albumin  low, other LFTs WNL Intake / Output; MIVF: UOP: 950 mls Stool: last recorded x 2 on 11/20 I/O: + 2867 mls (1 U PRBC 11/21 & 11/22)  IVF: LR @ 75 ml/hr GI Imaging: - 11/16 CT:  Enlarged appendix concerning for mild acute appendicitis.Circumferential narrowing with slight associated increased soft tissue density/enhancement over the ascending colon in the region of the hepatic flexure. This is concerning for a focal mass/neoplasm. Wall thickening of a moderate segment of the distal ileum. Findings may be due to small bowel enteritis of infectious or inflammatory nature. 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 Mild diverticulosis with no evidence of diverticular bleeding.  - 11/21 laparoscopic right colectomy with extended resection of distal ileum  Central access: PICC 11/18 TPN start date: 11/18  Nutritional Goals: Goal TPN rate is 19mL/hr (provides 84 g of  protein and 1700 kcals per day)  RD Assessment: TBD Estimated Needs Total Energy Estimated Needs: 1550-1750 Total Protein Estimated Needs: 75-85g Total Fluid Estimated Needs: 1.7L/day  Current Nutrition:  NPO and TPN  Plan:  Now: - Mag 4 gm IVPB x 1 now  At 1800: - Continue TPN at goal rate of 70 mL/hr - Electrolytes in TPN:  Na 60mEq/L K 50mEq/L Ca 70mEq/L Mg 75mEq/L Phos 15mmol/L Cl:Ac max acetate - Add standard MVI and trace elements to TPN - IVF LR 75 ml/hr per TRH -continue Sensitive q6h SSI and adjust as needed - Monitor TPN labs on Mon/Thurs, and PRN   Rosaline IVAR Edison, Pharm.D Use secure chat for questions 06/13/2024 9:09 AM

## 2024-06-13 NOTE — Plan of Care (Signed)

## 2024-06-13 NOTE — Progress Notes (Signed)
 1 Day Post-Op   Subjective/Chief Complaint: Sitting chair, doing well overall   Objective: Vital signs in last 24 hours: Temp:  [97.4 F (36.3 C)-98.9 F (37.2 C)] 98.9 F (37.2 C) (11/22 0539) Pulse Rate:  [71-80] 78 (11/22 0742) Resp:  [12-20] 18 (11/22 0742) BP: (93-152)/(53-88) 147/70 (11/22 0539) SpO2:  [92 %-97 %] 97 % (11/22 0539) Weight:  [86.4 kg-87.2 kg] 87.2 kg (11/22 0509) Last BM Date : 06/11/24  Intake/Output from previous day: 11/21 0701 - 11/22 0700 In: 4478.8 [I.V.:3153.6; Blood:627; IV Piggyback:698.3] Out: 1300 [Urine:950; Blood:350] Intake/Output this shift: No intake/output data recorded.  General nad Ab soft some drainage on midline, approp tender not distended  Lab Results:  Recent Labs    06/12/24 0259 06/12/24 2006 06/13/24 0402  WBC 6.4  --  12.2*  HGB 7.9* 8.7* 6.4*  HCT 25.0* 27.4* 20.3*  PLT 155  --  141*   BMET Recent Labs    06/12/24 0018 06/13/24 0402  NA 140 138  K 3.7 4.7  CL 112* 110  CO2 22 22  GLUCOSE 128* 220*  BUN 14 18  CREATININE 1.38* 1.30*  CALCIUM  7.7* 7.6*   PT/INR No results for input(s): LABPROT, INR in the last 72 hours. ABG No results for input(s): PHART, HCO3 in the last 72 hours.  Invalid input(s): PCO2, PO2  Studies/Results: No results found.  Anti-infectives: Anti-infectives (From admission, onward)    Start     Dose/Rate Route Frequency Ordered Stop   06/12/24 1000  cefoTEtan  (CEFOTAN ) 2 g in sodium chloride  0.9 % 100 mL IVPB        2 g 200 mL/hr over 30 Minutes Intravenous On call to O.R. 06/12/24 0948 06/12/24 1315       Assessment/Plan: POD 1 extended right colon- Tanda --will transfuse one unit this am, discussed with patient -dc foley -continue ng tube, I am not sure how much output and it is bilious -oob, pulm toilet -Cr stable today, recheck labs am -continue TPN -pharm dvt prophylaxis held and would do that for another 24 hours   Donnice Bury 06/13/2024

## 2024-06-13 NOTE — Evaluation (Signed)
 Physical Therapy Evaluation Patient Details Name: Brooke [name] MRN: [id number] DOB: 04-27-1951 Today's Date: 06/13/2024  History of Present Illness  Brooke [name] is a 73 y.o. female who presented to [facility] ED on 06/07/2024 from home with complaint of abdominal pain associated with nausea and vomiting; s/p  LAPAROSCOPIC ASSISTED  RIGHT COLECTOMY WITH EXTENDED RESECTION OF DISTAL ILEUM 11/21. PMH: HTN, HLD, DM2, asthma, anxiety, hypothyroidism, CKD stage IIIb, GERD  Clinical Impression  Pt admitted with above diagnosis. PTA, pt reports ind with rollator walker, ind with self care, daughter completes household chores. On eval, pt reports awaiting RN to change dressing, noted lower portion of dressing to be saturated in blood. Pt with NG tube hooked to suction and awaiting unit of blood but pt agreeable to therapy. Therapist lowers BLE leg rest of recliner, AROM WFL in 90/90 position, pt pulling on armrests of recliner to upright trunk and slide out to edge of recliner but reports high pain and proceeds to scoot back and reposition in chair. Recommend HHPT at d/c and continued family support. Pt currently with functional limitations due to the deficits listed below (see PT Problem List). Pt will benefit from acute skilled PT to increase their independence and safety with mobility to allow discharge.           If plan is discharge home, recommend the following: A little help with bathing/dressing/bathroom;Help with stairs or ramp for entrance;Assist for transportation;Assistance with cooking/housework;A little help with walking and/or transfers   Can travel by private vehicle        Equipment Recommendations None recommended by PT  Recommendations for Other Services       Functional Status Assessment Patient has had a recent decline in their functional status and demonstrates the ability to make significant improvements in function in a reasonable and predictable amount of time.      Precautions / Restrictions Precautions Precautions: Fall Recall of Precautions/Restrictions: Intact Precaution/Restrictions Comments: abdominal surgery, NG Restrictions Weight Bearing Restrictions Per Provider Order: No      Mobility  Bed Mobility               General bed mobility comments: pt in recliner upon arrival, as pt attempts pulls trunk forward in sitting reports high pain, also reports still awaiting RN to change dressing and declines to stand up due to pain    Transfers                        Ambulation/Gait                  Stairs            Wheelchair Mobility     Tilt Bed    Modified Rankin (Stroke Patients Only)       Balance                                             Pertinent Vitals/Pain Pain Assessment Pain Assessment: 0-10 Pain Score: 7  Pain Location: abdomen Pain Descriptors / Indicators:  (it hurt) Pain Intervention(s): Limited activity within patient's tolerance, Monitored during session, Repositioned    Home Living Family/patient expects to be discharged to:: Private residence Living Arrangements: Children Available Help at Discharge: Family;Available 24 hours/day Type of Home: House Home Access: Stairs to enter Entrance Stairs-Rails: Right;Left;Can reach both Entrance Stairs-Number of  Steps: 2   Home Layout: One level Home Equipment: Rollator (4 wheels);Cane - single point;Hand held shower head Additional Comments: 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    Prior Function Prior Level of Function : Independent/Modified Independent             Mobility Comments: pt reports using rollator in the home and for appointments ADLs Comments: pt reports daughter completes household chores and provides transportation, pt reports was ind with bathing, dressing and toileting     Extremity/Trunk Assessment   Upper Extremity Assessment Upper Extremity Assessment: Defer  to OT evaluation    Lower Extremity Assessment Lower Extremity Assessment: Generalized weakness (AROM WFL, strength grossly 3+/5, denies numbness/tingling, no MMT due to abdominal surgery and pain)    Cervical / Trunk Assessment Cervical / Trunk Assessment: Normal  Communication   Communication Communication: No apparent difficulties    Cognition Arousal: Alert Behavior During Therapy: WFL for tasks assessed/performed   PT - Cognitive impairments: No apparent impairments                         Following commands: Intact       Cueing Cueing Techniques: Verbal cues     General Comments General comments (skin integrity, edema, etc.): Pt with abdominal incision noted, lower portion of dressing saturated in blood and pt reports awaiting RN to change it    Exercises     Assessment/Plan    PT Assessment Patient needs continued PT services  PT Problem List Decreased strength;Decreased activity tolerance;Decreased balance;Decreased mobility;Decreased knowledge of use of DME;Decreased safety awareness;Decreased knowledge of precautions;Pain       PT Treatment Interventions DME instruction;Therapeutic activities;Gait training;Functional mobility training;Therapeutic exercise;Patient/family education;Balance training    PT Goals (Current goals can be found in the Care Plan section)  Acute Rehab PT Goals Patient Stated Goal: less pain, return home PT Goal Formulation: With patient Time For Goal Achievement: 06/27/24 Potential to Achieve Goals: Good    Frequency Min 2X/week     Co-evaluation               AM-PAC PT 6 Clicks Mobility  Outcome Measure Help needed turning from your back to your side while in a flat bed without using bedrails?: A Lot Help needed moving from lying on your back to sitting on the side of a flat bed without using bedrails?: A Lot Help needed moving to and from a bed to a chair (including a wheelchair)?: A Lot Help needed  standing up from a chair using your arms (e.g., wheelchair or bedside chair)?: A Lot Help needed to walk in hospital room?: A Lot Help needed climbing 3-5 steps with a railing? : Total 6 Click Score: 11    End of Session   Activity Tolerance: Patient limited by pain Patient left: in chair;with call bell/phone within reach Nurse Communication: Mobility status PT Visit Diagnosis: Muscle weakness (generalized) (M62.81);Difficulty in walking, not elsewhere classified (R26.2);Pain Pain - part of body:  (abdominal)    Time: 8947-8893 PT Time Calculation (min) (ACUTE ONLY): 14 min   Charges:   PT Evaluation $PT Eval Moderate Complexity: 1 Mod   PT General Charges $$ ACUTE PT VISIT: 1 Visit         Tori Tashina Credit PT, DPT 06/13/24, 1:22 PM

## 2024-06-13 NOTE — Progress Notes (Signed)
 PHARMACY - TOTAL PARENTERAL NUTRITION CONSULT NOTE   Indication: Planned SBR  Patient Measurements: Height: 5' 3 (160 cm) Weight: 87.2 kg (192 lb 3.9 oz) IBW/kg (Calculated) : 52.4 TPN AdjBW (KG): 60.9 Body mass index is 34.05 kg/m. Usual Weight:   Assessment: Abdominal pain, NV, unable to keep anything down; concerns for hepatic flexure stricture, terminal ileitis and a liver mass. Dilation of appendix also noted concerning for mild acute appendicitis Will need colonoscopy and possible surgical intervention. Pharmacy consulted for TPN management.  Glucose / Insulin : +DM not currently on any treatment PTA - CBGs 131-282 in last 24hrs, got dexamethasone  10 mg x 1 preop 11/21> CBGs elevated after steroid given - 11 units of insulin  used in the last 24hrs Electrolytes: K up to 4.7 & phos up to 2.9 after 30 mMol Kphos yesterday. Mag low at 1.6; Cl 112 > 110,  All others WNL, including CoCa 8.88 Renal: CKD3 with Scr 1.30  Hepatic: albumin  low, other LFTs WNL Intake / Output; MIVF: UOP: 950 mls Stool: last recorded x 2 on 11/20 I/O: + 2867 mls (1 U PRBC 11/21 & 11/22)  IVF: LR @ 75 ml/hr GI Imaging: - 11/16 CT:  Enlarged appendix concerning for mild acute appendicitis.Circumferential narrowing with slight associated increased soft tissue density/enhancement over the ascending colon in the region of the hepatic flexure. This is concerning for a focal mass/neoplasm. Wall thickening of a moderate segment of the distal ileum. Findings may be due to small bowel enteritis of infectious or inflammatory nature. 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 Mild diverticulosis with no evidence of diverticular bleeding.  - 11/21 laparoscopic right colectomy with extended resection of distal ileum  Central access: PICC 11/18 TPN start date: 11/18  Nutritional Goals: Goal TPN rate is 39mL/hr (provides 84 g of  protein and 1700 kcals per day)  RD Assessment: TBD Estimated Needs Total Energy Estimated Needs: 1550-1750 Total Protein Estimated Needs: 75-85g Total Fluid Estimated Needs: 1.7L/day  Current Nutrition:  NPO and TPN  Plan:  Now: - Mag 4 gm IVPB x 1 now  At 1800: - Continue TPN at goal rate of 70 mL/hr - Electrolytes in TPN:  Na 50mEq/L K 50mEq/L Ca 40mEq/L Mg 86mEq/L Phos 15mmol/L Cl:Ac max acetate - Add standard MVI and trace elements to TPN - per d/w TRH> will DC LR @ 75 ml/hr -continue Sensitive q6h SSI and adjust as needed - Monitor TPN labs on Mon/Thurs, and PRN   Rosaline IVAR Edison, Pharm.D Use secure chat for questions 06/13/2024 9:14 AM

## 2024-06-14 DIAGNOSIS — K6389 Other specified diseases of intestine: Secondary | ICD-10-CM | POA: Diagnosis not present

## 2024-06-14 LAB — BASIC METABOLIC PANEL WITH GFR
Anion gap: 6 (ref 5–15)
BUN: 22 mg/dL (ref 8–23)
CO2: 25 mmol/L (ref 22–32)
Calcium: 8 mg/dL — ABNORMAL LOW (ref 8.9–10.3)
Chloride: 107 mmol/L (ref 98–111)
Creatinine, Ser: 1.26 mg/dL — ABNORMAL HIGH (ref 0.44–1.00)
GFR, Estimated: 45 mL/min — ABNORMAL LOW (ref >60)
Glucose, Bld: 146 mg/dL — ABNORMAL HIGH (ref 70–99)
Potassium: 4.9 mmol/L (ref 3.5–5.1)
Sodium: 138 mmol/L (ref 135–145)

## 2024-06-14 LAB — CBC
HCT: 21.9 % — ABNORMAL LOW (ref 36.0–46.0)
Hemoglobin: 7.2 g/dL — ABNORMAL LOW (ref 12.0–15.0)
MCH: 27.9 pg (ref 26.0–34.0)
MCHC: 32.9 g/dL (ref 30.0–36.0)
MCV: 84.9 fL (ref 80.0–100.0)
Platelets: 124 K/uL — ABNORMAL LOW (ref 150–400)
RBC: 2.58 MIL/uL — ABNORMAL LOW (ref 3.87–5.11)
RDW: 16.2 % — ABNORMAL HIGH (ref 11.5–15.5)
WBC: 16.5 K/uL — ABNORMAL HIGH (ref 4.0–10.5)
nRBC: 0.2 % (ref 0.0–0.2)

## 2024-06-14 LAB — GLUCOSE, CAPILLARY
Glucose-Capillary: 149 mg/dL — ABNORMAL HIGH (ref 70–99)
Glucose-Capillary: 144 mg/dL — ABNORMAL HIGH (ref 70–99)
Glucose-Capillary: 136 mg/dL — ABNORMAL HIGH (ref 70–99)

## 2024-06-14 LAB — PREPARE RBC (CROSSMATCH)

## 2024-06-14 LAB — HEMOGLOBIN AND HEMATOCRIT, BLOOD
HCT: 26.3 % — ABNORMAL LOW (ref 36.0–46.0)
Hemoglobin: 8.6 g/dL — ABNORMAL LOW (ref 12.0–15.0)

## 2024-06-14 LAB — PHOSPHORUS: Phosphorus: 2.7 mg/dL (ref 2.5–4.6)

## 2024-06-14 LAB — MAGNESIUM: Magnesium: 2.6 mg/dL — ABNORMAL HIGH (ref 1.7–2.4)

## 2024-06-14 MED ORDER — TRAVASOL 10 % IV SOLN
INTRAVENOUS | Status: AC
  Filled 2024-06-14: qty 840

## 2024-06-14 MED ORDER — ACETAMINOPHEN 10 MG/ML IV SOLN
1000.0000 mg | Freq: Four times a day (QID) | INTRAVENOUS | Status: AC
  Administered 2024-06-14 – 2024-06-15 (×4): 1000 mg via INTRAVENOUS
  Filled 2024-06-14 (×4): qty 100

## 2024-06-14 MED ORDER — SODIUM CHLORIDE 0.9% IV SOLUTION: Freq: Once | INTRAVENOUS | Status: AC

## 2024-06-14 NOTE — Progress Notes (Signed)
 PROGRESS NOTE    Brooke [name]  FMW:[id number] DOB: 1950-08-06 DOA: 06/07/2024 PCP: Norleen Lynwood ORN, MD    Brief Narrative:   Brooke [name] is a 73 y.o. female with past medical history significant for HTN, HLD, DM2, asthma, anxiety, hypothyroidism, CKD stage IIIb, GERD who presented to [facility] ED on 06/07/2024 from home with complaint of abdominal pain associated with nausea and vomiting.  Poor appetite.  Pain is localized to the epigastric region and rated as a 6/10, sharp and constant.  No radiation.  Food exacerbates symptoms.  Denies diarrhea, no fever/chills.  Denies any blood in her stool.  Further denies chest pain, no palpitations, no shortness of breath, no cough, no urinary symptoms.  Last colonoscopy 12/2019 with diverticulosis of the sigmoid colon, otherwise normal.  No family history of colon cancer.  In the ED, temperature 98.1 F, HR 78, RR 18, BP 152/82, SpO2 99% on room air.  WBC 8.5, hemoglobin 10.3, platelet count 322.  Sodium 141, potassium 4.1, chloride 109, CO2 22, glucose 108, BUN 13, creat 1.61.  Alkaline phosphatase 180.  Lipase less than 10.  AST 27, ALT 12, total bilirubin 0.3.  Procalcitonin 0.33.  Lactic acid 2.0.  Vitamin B12 1004 and 76.  Iron 39, TIBC 207, ferritin 142.  Urinalysis with negative leukocytes negative nitrite, rare bacteria, 0-5 WBCs.  CT abdomen/pelvis with contrast with findings of enlarged appendix measuring 1.3 cm in diameter with enhancement and mild adjacent free fluid in the right lower quadrant concerning for mild acute appendicitis, focal circumferential narrowing with slight associated increased soft tissue density/enhancement over the ascending colon in the region of the hepatic flexure concerning for a focal mass/neoplasm, wall thickening of a moderate segment of distal ileum may be due to small bowel enteritis versus infectious versus inflammatory in nature, mild free peritoneal fluid, several liver cyst unchanged, 2.8 cm hyperdense mass over the  upper pole left kidney unchanged compatible with slightly hyperdense cyst, aortic atherosclerosis.  General surgery was consulted.  TRH consulted for admission for further evaluation and management.  Assessment & Plan:   Ascending colon mass, concerning for malignancy s/p lap assisted right colectomy with extended resection distal ileum, LOA Patient presenting with abdominal pain, nausea, vomiting and poor appetite.  Pain localized to the epigastric region.  Colonoscopy 2021 with diverticulosis of the sigmoid colon otherwise unrevealing.  CT abdomen/pelvis on admission with findings enlarged appendix measuring 1.3 cm in diameter with enhancement and mild adjacent free fluid in the right lower quadrant concerning for mild acute appendicitis, focal circumferential narrowing with slight associated increased soft tissue density/enhancement over the ascending colon in the region of the hepatic flexure concerning for a focal mass/neoplasm, wall thickening of a moderate segment of distal ileum may be due to small bowel enteritis versus infectious versus inflammatory in nature, mild free peritoneal fluid.  Colonoscopy 06/11/2024 with findings of a severe ulcerated stricture at the hepatic flexure concerning for malignancy, 4 mm polyp descending colon removed with cold snare.  Patient underwent laparoscopic-assisted right colectomy with extended resection of distal ileum and LOA by Dr. Tanda on 11/21. -- [facility], [facility]CS following; appreciate assistance -- Pathology: Pending -- Zofran  as needed nausea/vomiting -- Morphine  2 mg IV every 2 hours as needed severe pain -- CCS removing NG tube today, ice chips, continues on TPN  Acute appendicitis, ruled out Atypical findings on CT, seen by general surgery and clinically without signs or symptoms of acute appendicitis, suspect area of concern related to above mass  and obstructive pathology.  Hypokalemia Repleted, potassium 4.7 this morning. -- Repeat electrolytes  in a.m.  Hypophosphatemia Repleted  Hypomagnesemia Magnesium  1.6, will replete.  Acute on chronic normocytic anemia Acute postoperative blood loss anemia Anemia panel with iron 39, TIBC 207, ferritin 142, vitamin B12 1476.  -- Hgb 10.3>>8.3>8.2>7.9>8.7>6.4>8.8>7.4>7.2 -- Transfused 2 units PRBC (11/21 and 11/22) -- Transfuse 1 additional unit today -- H&H after transfusion -- Transfuse for hemoglobin less than 7.0 -- CBC in a.m.  HTN -- Diltiazem  100 mg p.o. daily -- Metoprolol  succinate 100 mg p.o. daily  HLD -- Crestor  40 mg p.o. daily  DM2 Hemoglobin A1c 5.4. -- Sensitive SSI for coverage -- CBG every 4 hours  CKD stage IIIb Baseline creatinine 1.5-1.9, stable. -- Cr 1.82>1.61>1.38>1.30>1.26 -- On TPN -- BMP in a.m.  Depression with anxiety -- Celexa  40 mg p.o. daily  Asthma -- Breztri  2 puffs twice daily (substituted for home Trelegy)  GERD -- Protonix  40 mg IV every 24 hours  Obesity, class I Body mass index is 36.2 kg/m.   DVT prophylaxis: SCDs Start: 06/07/24 2000 Place TED hose Start: 06/07/24 2000    Code Status: Full Code Family Communication: No family present at bedside this am  Disposition Plan:  Level of care: Progressive Status is: Inpatient Remains inpatient appropriate because: TPN, needs advancement of diet and stabilization of hemoglobin for stable for discharge home  Consultants:   gastroenterology General Surgery  Procedures:  Colonoscopy 11/20  laparoscopic-assisted right colectomy with extended resection of distal ileum and LOA; Dr. Tanda, 11/21  Antimicrobials:  Perioperative cefotetan    Subjective: Patient seen examined bedside, sitting in bedside chair.  NG tube remains in place.  Hemoglobin slightly drifting down to 7.2, will repeat transfusion today.  Seen by general surgery, plan to discontinue NG tube and start ice chips.  Remains on TPN. \ No other specific questions, concerns or complaints at this time.   Denies headache, no visual changes, no chest pain, no palpitations, no shortness of breath, no abdominal pain, no fever/chills/night sweats, no focal weakness, no fatigue, no paresthesia.  No acute events overnight per nursing staff.   Objective: Vitals:   06/13/24 2005 06/14/24 0500 06/14/24 0525 06/14/24 0829  BP: (!) 151/65  (!) 131/113   Pulse: 79  (!) 102   Resp: 14     Temp: 98.5 F (36.9 C)  98.2 F (36.8 C)   TempSrc: Oral  Oral   SpO2: 93%  93% 90%  Weight:  92.7 kg    Height:        Intake/Output Summary (Last 24 hours) at 06/14/2024 1146 Last data filed at 06/14/2024 0600 Gross per 24 hour  Intake 2057.1 ml  Output 1100 ml  Net 957.1 ml   Filed Weights   06/12/24 1125 06/13/24 0509 06/14/24 0500  Weight: 86.4 kg 87.2 kg 92.7 kg    Examination:  Physical Exam: GEN: NAD, alert and oriented x 3, obese  HEENT: NCAT, PERRL, EOMI, sclera clear, MMM, NG tube noted PULM: CTAB w/o wheezes/crackles, normal respiratory effort, room air CV: RRR w/o M/G/R GI: abd soft, NTND, faint bowel sounds; abdominal surgical incision site noted with honeycomb dressing in place, some mild blood strikethrough at inferior portion; NG tube noted in place GU: Foley catheter noted with clear yellow urine in collection bag MSK: no peripheral edema, moves all extremities independently NEURO: No focal neurological deficit PSYCH: normal mood/affect Integumentary: dry/intact, no rashes or wounds    Data Reviewed: I have personally reviewed following  labs and imaging studies  CBC: Recent Labs  Lab 06/07/24 1350 06/08/24 0404 06/12/24 0018 06/12/24 0259 06/12/24 2006 06/13/24 0402 06/13/24 1235 06/13/24 2214 06/14/24 0221  WBC 8.5   < > 4.4 6.4  --  12.2* 17.4*  --  16.5*  NEUTROABS 7.0  --   --   --   --   --   --   --   --   HGB 10.3*   < > 6.8* 7.9* 8.7* 6.4* 8.8* 7.4* 7.2*  HCT 32.2*   < > 21.5* 25.0* 27.4* 20.3* 26.0* 22.0* 21.9*  MCV 84.3   < > 86.0 86.5  --  85.7 85.0  --   84.9  PLT 322   < > 131* 155  --  141* 135*  --  124*   < > = values in this interval not displayed.   Basic Metabolic Panel: Recent Labs  Lab 06/10/24 0343 06/11/24 0407 06/12/24 0018 06/13/24 0402 06/14/24 0221  NA 140 138 140 138 138  K 3.1* 3.7 3.7 4.7 4.9  CL 108 108 112* 110 107  CO2 21* 21* 22 22 25   GLUCOSE 123* 97 128* 220* 146*  BUN 17 17 14 18 22   CREATININE 1.82* 1.64* 1.38* 1.30* 1.26*  CALCIUM  7.8* 7.7* 7.7* 7.6* 8.0*  MG 2.5* 2.0 1.8 1.6* 2.6*  PHOS 1.6* 2.7 1.8* 2.9 2.7   GFR: Estimated Creatinine Clearance: 43.6 mL/min (A) (by C-G formula based on SCr of 1.26 mg/dL (H)). Liver Function Tests: Recent Labs  Lab 06/08/24 0404 06/10/24 0343 06/11/24 0407 06/12/24 0018 06/13/24 0402  AST 21 14* 13* 12* 15  ALT 13 9 9 9 11   ALKPHOS 176* 127* 115 91 60  BILITOT 0.3 <0.2 <0.2 <0.2 <0.2  PROT 5.8* 4.4* 4.5* 4.0* 4.1*  ALBUMIN  2.7* 2.2* 2.2* 2.0* 2.4*   Recent Labs  Lab 06/07/24 1350  LIPASE <10*   No results for input(s): AMMONIA in the last 168 hours. Coagulation Profile: Recent Labs  Lab 06/08/24 0404  INR 1.2   Cardiac Enzymes: No results for input(s): CKTOTAL, CKMB, CKMBINDEX, TROPONINI in the last 168 hours. BNP (last 3 results) No results for input(s): PROBNP in the last 8760 hours. HbA1C: No results for input(s): HGBA1C in the last 72 hours. CBG: Recent Labs  Lab 06/13/24 0544 06/13/24 1202 06/13/24 1730 06/13/24 2357 06/14/24 0606  GLUCAP 203* 197* 175* 146* 149*   Lipid Profile: No results for input(s): CHOL, HDL, LDLCALC, TRIG, CHOLHDL, LDLDIRECT in the last 72 hours. Thyroid  Function Tests: No results for input(s): TSH, T4TOTAL, FREET4, T3FREE, THYROIDAB in the last 72 hours. Anemia Panel: No results for input(s): VITAMINB12, FOLATE, FERRITIN, TIBC, IRON, RETICCTPCT in the last 72 hours.  Sepsis Labs: Recent Labs  Lab 06/07/24 1841 06/07/24 1916 06/07/24 2100  06/08/24 0404  PROCALCITON 0.33  --   --  0.35  LATICACIDVEN  --  2.0* 1.3  --     Recent Results (from the past 240 hours)  Surgical pcr screen     Status: Abnormal   Collection Time: 06/12/24 10:55 AM   Specimen: Nasal Mucosa; Nasal Swab  Result Value Ref Range Status   MRSA, PCR POSITIVE (A) NEGATIVE Final    Comment: RESULT CALLED TO, READ BACK BY AND VERIFIED WITH:  TANDA, E 06/12/2024 1257 AJ    Staphylococcus aureus POSITIVE (A) NEGATIVE Final    Comment: (NOTE) The Xpert SA Assay (FDA approved for NASAL specimens in patients 47 years of age and  older), is one component of a comprehensive surveillance program. It is not intended to diagnose infection nor to guide or monitor treatment. Performed at [facility], [address] [place], [place] [address]          Radiology Studies: No results found.       Scheduled Meds:  sodium chloride    Intravenous Once   allopurinol   100 mg Oral Daily   alvimopan   12 mg Oral BID   atropine   1 drop Left Eye Daily   azelastine   2 spray Each Nare BID   brimonidine   1 drop Left Eye BID   budesonide -glycopyrrolate -formoterol   2 puff Inhalation BID   Chlorhexidine  Gluconate Cloth  6 each Topical Daily   citalopram   40 mg Oral Daily   diltiazem   180 mg Oral Daily   erythromycin    Left Eye QHS   insulin  aspart  0-9 Units Subcutaneous Q6H   latanoprost   1 drop Left Eye QHS   loratadine   10 mg Oral Daily   metoprolol  succinate  100 mg Oral Daily   mupirocin  ointment  1 Application Nasal BID   pantoprazole  (PROTONIX ) IV  40 mg Intravenous Q24H   rosuvastatin   40 mg Oral Daily   sodium bicarbonate   1,300 mg Oral Daily   Continuous Infusions:  acetaminophen      TPN ADULT (ION) 70 mL/hr at 06/14/24 0600   TPN ADULT (ION)       LOS: 7 days    Time spent: 50 minutes spent on 06/14/2024 caring for this patient face-to-face including chart review, ordering labs/tests, documenting, discussion with nursing  staff, consultants, updating family and interview/physical exam    Brooke PARAS Eland Lamantia, DO [facility] Available via Epic secure chat 7am-7pm After these hours, please refer to coverage provider listed on [email or web address] 06/14/2024, 11:46 AM

## 2024-06-14 NOTE — Progress Notes (Signed)
 2 Days Post-Op   Subjective/Chief Complaint: No bowel function, voiding after foley out, in chair   Objective: Vital signs in last 24 hours: Temp:  [98.2 F (36.8 C)-98.5 F (36.9 C)] 98.2 F (36.8 C) (11/23 0525) Pulse Rate:  [79-102] 102 (11/23 0525) Resp:  [14-18] 14 (11/22 2005) BP: (131-151)/(65-113) 131/113 (11/23 0525) SpO2:  [93 %-96 %] 93 % (11/23 0525) Weight:  [92.7 kg] 92.7 kg (11/23 0500) Last BM Date : 06/11/24  Intake/Output from previous day: 11/22 0701 - 11/23 0700 In: 2057.1 [P.O.:180; I.V.:1677.1; IV Piggyback:200] Out: 1100 [Urine:700; Emesis/NG output:400] Intake/Output this shift: No intake/output data recorded.  General nad Ab soft dressing intact, approp tender not distended  Lab Results:  Recent Labs    06/13/24 1235 06/13/24 2214 06/14/24 0221  WBC 17.4*  --  16.5*  HGB 8.8* 7.4* 7.2*  HCT 26.0* 22.0* 21.9*  PLT 135*  --  124*   BMET Recent Labs    06/13/24 0402 06/14/24 0221  NA 138 138  K 4.7 4.9  CL 110 107  CO2 22 25  GLUCOSE 220* 146*  BUN 18 22  CREATININE 1.30* 1.26*  CALCIUM  7.6* 8.0*   PT/INR No results for input(s): LABPROT, INR in the last 72 hours. ABG No results for input(s): PHART, HCO3 in the last 72 hours.  Invalid input(s): PCO2, PO2  Studies/Results: No results found.  Anti-infectives: Anti-infectives (From admission, onward)    Start     Dose/Rate Route Frequency Ordered Stop   06/12/24 1000  cefoTEtan  (CEFOTAN ) 2 g in sodium chloride  0.9 % 100 mL IVPB        2 g 200 mL/hr over 30 Minutes Intravenous On call to O.R. 06/12/24 0948 06/12/24 1315       Assessment/Plan: POD 2 extended right colon- Wilson --written for one unit this am, hct increased but now back down. Doesn't have anything per rectum may just be ooze intraabdominally but agree with blood and rechecking -will dc ng, not much out and nonbilious -will let her have ice chips -oob, pulm toilet -Cr stable today, recheck  labs am -continue TPN -hold pharm dvt prophylaxis   Donnice Bury 06/14/2024

## 2024-06-14 NOTE — Progress Notes (Signed)
 [facility] ROUNDING NOTE   Subjective: Patient doing fairly well.  Not passing anything per rectum yet.  Looking forward to getting NG tube removed.  Had some abdominal pain last night, but none today so far.  Afebrile.   Objective: Vital signs in last 24 hours: Temp:  [98.2 F (36.8 C)-99 F (37.2 C)] 99 F (37.2 C) (11/23 1144) Pulse Rate:  [79-102] 94 (11/23 1144) Resp:  [14-18] 17 (11/23 1144) BP: (131-156)/(65-113) 156/79 (11/23 1144) SpO2:  [90 %-96 %] 90 % (11/23 0829) Weight:  [92.7 kg] 92.7 kg (11/23 0500) Last BM Date : 06/11/24 General: NAD, pleasant African-American female Lungs:  CTA b/l, no w/r/r Heart:  RRR, no m/r/g Abdomen:  Soft, appropriate tenderness to palpation postop, ND, hypoactive bowel sounds, dressing intact without warmth/erythema Ext:  No c/c/e    Intake/Output from previous day: 11/22 0701 - 11/23 0700 In: 2057.1 [P.O.:180; I.V.:1677.1; IV Piggyback:200] Out: 1100 [Urine:700; Emesis/NG output:400] Intake/Output this shift: No intake/output data recorded.   Lab Results: Recent Labs    06/13/24 0402 06/13/24 1235 06/13/24 2214 06/14/24 0221  WBC 12.2* 17.4*  --  16.5*  HGB 6.4* 8.8* 7.4* 7.2*  PLT 141* 135*  --  124*  MCV 85.7 85.0  --  84.9   BMET Recent Labs    06/12/24 0018 06/13/24 0402 06/14/24 0221  NA 140 138 138  K 3.7 4.7 4.9  CL 112* 110 107  CO2 22 22 25   GLUCOSE 128* 220* 146*  BUN 14 18 22   CREATININE 1.38* 1.30* 1.26*  CALCIUM  7.7* 7.6* 8.0*   LFT Recent Labs    06/12/24 0018 06/13/24 0402  PROT 4.0* 4.1*  ALBUMIN  2.0* 2.4*  AST 12* 15  ALT 9 11  ALKPHOS 91 60  BILITOT <0.2 <0.2   PT/INR No results for input(s): INR in the last 72 hours.    Imaging/Other results: No results found.    Assessment and Plan:  73 year old female with severe colonic stricture at hepatic flexure with associated ileitis of unclear etiology.  Biopsies from colonoscopy on 11/20 not resulted.  She  underwent a extended right hemicolectomy with ileal resection on 11/21.  Pathology pending. Etiology of colonic stricture/ileitis unclear at the moment.  Differential includes infectious, IBD and neoplastic.  Awaiting pathology results for further recommendations. No additional input from GI at this time.  Will await pathology results before determining if any further GI follow-up needed.  Colonic stricture/ileitis, status post extended right hemicolectomy/ileectomy - Pathology pending - Postop management per surgery - No new recommendations from GI standpoint.  Will sign off at this time.  Will follow-up with the patient once pathology results     Glendia FORBES Holt, MD  06/14/2024, 12:02 PM [facility]

## 2024-06-14 NOTE — Progress Notes (Signed)
 PHARMACY - TOTAL PARENTERAL NUTRITION CONSULT NOTE   Indication: Planned SBR  Patient Measurements: Height: 5' 3 (160 cm) Weight: 92.7 kg (204 lb 5.9 oz) IBW/kg (Calculated) : 52.4 TPN AdjBW (KG): 60.9 Body mass index is 36.2 kg/m. Usual Weight:   Assessment: Abdominal pain, NV, unable to keep anything down; concerns for hepatic flexure stricture, terminal ileitis and a liver mass. Dilation of appendix also noted concerning for mild acute appendicitis Will need colonoscopy and possible surgical intervention. Pharmacy consulted for TPN management.  Glucose / Insulin : +DM not currently on any treatment PTA - CBGs 146 - 203 in last 24hrs, got dexamethasone  10 mg x 1 preop 11/21. CBGs elevated after steroid given but down trending towards goal range - 6 units of insulin  used in the last 24hrs Electrolytes: K up to 4.9; phos 2.7, Mag up to 2.6 after 4 gm bolus give yesterday; Cl 112 > 110> 107,  All others WNL, including CoCa 9.28 Renal: CKD3 with Scr 1.26, down trending Hepatic: albumin  low, other LFTs WNL Intake / Output; MIVF: UOP: 700 mls NGO: 400 mls Stool: last recorded x 2 on 11/20 I/O: + 1269 mls (1 U PRBC 11/21 & 11/22 & 11/23)  IVF: LR @ 75 ml/hr stopped  11/22 AM GI Imaging: - 11/16 CT:  Enlarged appendix concerning for mild acute appendicitis.Circumferential narrowing with slight associated increased soft tissue density/enhancement over the ascending colon in the region of the hepatic flexure. This is concerning for a focal mass/neoplasm. Wall thickening of a moderate segment of the distal ileum. Findings may be due to small bowel enteritis of infectious or inflammatory nature. 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 Mild diverticulosis with no evidence of diverticular bleeding.  - 11/21 laparoscopic right colectomy with extended resection of distal ileum  Central access: PICC  11/18 TPN start date: 11/18  Nutritional Goals: Goal TPN rate is 51mL/hr (provides 84 g of protein and 1700 kcals per day)  RD Assessment: TBD Estimated Needs Total Energy Estimated Needs: 1550-1750 Total Protein Estimated Needs: 75-85g Total Fluid Estimated Needs: 1.7L/day  Current Nutrition:  NPO and TPN  Plan:   At 1800: - Continue TPN at goal rate of 70 mL/hr - Electrolytes in TPN:  Na 50mEq/L K 40 mEq/L decreased Ca 21mEq/L Mg 4 mEq/L - decreased Phos 34mmol/L Cl:Ac 1:1  - Add standard MVI and trace elements to TPN - -continue Sensitive q6h SSI and adjust as needed - Monitor TPN labs on Mon/Thurs, and PRN   Brooke [name], Pharm.D Use secure chat for questions 06/14/2024 10:41 AM

## 2024-06-14 NOTE — Progress Notes (Signed)
 Physical Therapy Treatment Patient Details Name: Brooke [name] MRN: [id number] DOB: 1951/06/02 Today's Date: 06/14/2024   History of Present Illness Brooke [name] is a 73 y.o. female who presented to [facility] ED on 06/07/2024 from home with complaint of abdominal pain associated with nausea and vomiting; s/p  LAPAROSCOPIC ASSISTED  RIGHT COLECTOMY WITH EXTENDED RESECTION OF DISTAL ILEUM 11/21. PMH: HTN, HLD, DM2, asthma, anxiety, hypothyroidism, CKD stage IIIb, GERD    PT Comments  Pt agreeable to light activity, in chair on PT arrival. Began blood transfusion prior PT arrival. Pt able to perform STS with min assist to power up, participate in standing LE exercises and light seated exercise; further activity ltd by abd pain, fatigue, RN ready to d/c NGT.  D/c plan remains appropriate--HHPT; continu to follow in acute setting    If plan is discharge home, recommend the following: A little help with bathing/dressing/bathroom;Help with stairs or ramp for entrance;Assist for transportation;Assistance with cooking/housework;A little help with walking and/or transfers   Can travel by private vehicle        Equipment Recommendations  None recommended by PT    Recommendations for Other Services       Precautions / Restrictions Precautions Precautions: Fall Recall of Precautions/Restrictions: Intact Precaution/Restrictions Comments: abdominal surgery; NGT to d/c today-11/23 Restrictions Weight Bearing Restrictions Per Provider Order: No     Mobility  Bed Mobility               General bed mobility comments: pt in recliner upon arrival, as pt attempts pulls trunk forward in sitting with cues, c/o abd pain with movement    Transfers Overall transfer level: Needs assistance Equipment used: Rolling walker (2 wheels), None Transfers: Sit to/from Stand Sit to Stand: Min assist           General transfer comment: cues for hand placement and to power up with LEs     Ambulation/Gait             Pre-gait activities: marching in standing x 15 bil LEs, lateral wt shifting General Gait Details: deferred d/t pt fatigue/pain--+just began transfusion   Stairs             Wheelchair Mobility     Tilt Bed    Modified Rankin (Stroke Patients Only)       Balance   Sitting-balance support: No upper extremity supported, Feet unsupported Sitting balance-Leahy Scale: Fair     Standing balance support: During functional activity, Bilateral upper extremity supported, Reliant on assistive device for balance Standing balance-Leahy Scale: Poor Standing balance comment: reliant on device                            Communication Communication Communication: No apparent difficulties  Cognition Arousal: Alert Behavior During Therapy: WFL for tasks assessed/performed   PT - Cognitive impairments: No apparent impairments                         Following commands: Intact      Cueing Cueing Techniques: Verbal cues  Exercises General Exercises - Lower Extremity Long Arc Quad: AROM, Strengthening, Both, 5 reps, Seated Ankle pumps x 5 bil LEs    General Comments        Pertinent Vitals/Pain Pain Assessment Pain Assessment: Faces Faces Pain Scale: Hurts little more Pain Location: abdomen Pain Descriptors / Indicators: Discomfort, Grimacing Pain Intervention(s): Limited activity within patient's tolerance, Monitored during  session, Repositioned    Home Living                          Prior Function            PT Goals (current goals can now be found in the care plan section) Acute Rehab PT Goals Patient Stated Goal: less pain, return home PT Goal Formulation: With patient Time For Goal Achievement: 06/27/24 Potential to Achieve Goals: Good Progress towards PT goals: Progressing toward goals    Frequency    Min 2X/week      PT Plan      Co-evaluation              AM-PAC PT  6 Clicks Mobility   Outcome Measure  Help needed turning from your back to your side while in a flat bed without using bedrails?: A Lot Help needed moving from lying on your back to sitting on the side of a flat bed without using bedrails?: A Lot Help needed moving to and from a bed to a chair (including a wheelchair)?: A Little Help needed standing up from a chair using your arms (e.g., wheelchair or bedside chair)?: A Little Help needed to walk in hospital room?: A Little Help needed climbing 3-5 steps with a railing? : A Lot 6 Click Score: 15    End of Session   Activity Tolerance: Patient limited by fatigue;Patient limited by pain Patient left: in chair;with call bell/phone within reach;with nursing/sitter in room Nurse Communication: Mobility status PT Visit Diagnosis: Muscle weakness (generalized) (M62.81);Difficulty in walking, not elsewhere classified (R26.2);Pain Pain - part of body:  (abd)     Time: 8855-8842 PT Time Calculation (min) (ACUTE ONLY): 13 min  Charges:    $Therapeutic Activity: 8-22 mins PT General Charges $$ ACUTE PT VISIT: 1 Visit                     Jermane Brayboy, PT  Acute Rehab Dept [facility]) [phone]  06/14/2024    [facility] 06/14/2024, 12:22 PM

## 2024-06-14 NOTE — Plan of Care (Signed)

## 2024-06-15 ENCOUNTER — Encounter (HOSPITAL_COMMUNITY): Payer: Self-pay | Admitting: General Surgery

## 2024-06-15 DIAGNOSIS — K6389 Other specified diseases of intestine: Secondary | ICD-10-CM | POA: Diagnosis not present

## 2024-06-15 LAB — CBC
HCT: 25.2 % — ABNORMAL LOW (ref 36.0–46.0)
Hemoglobin: 8.2 g/dL — ABNORMAL LOW (ref 12.0–15.0)
MCH: 28.1 pg (ref 26.0–34.0)
MCHC: 32.5 g/dL (ref 30.0–36.0)
MCV: 86.3 fL (ref 80.0–100.0)
Platelets: 117 K/uL — ABNORMAL LOW (ref 150–400)
RBC: 2.92 MIL/uL — ABNORMAL LOW (ref 3.87–5.11)
RDW: 16.5 % — ABNORMAL HIGH (ref 11.5–15.5)
WBC: 11.8 K/uL — ABNORMAL HIGH (ref 4.0–10.5)
nRBC: 1.4 % — ABNORMAL HIGH (ref 0.0–0.2)

## 2024-06-15 LAB — COMPREHENSIVE METABOLIC PANEL WITH GFR
ALT: 14 U/L (ref 0–44)
AST: 18 U/L (ref 15–41)
Albumin: 2.2 g/dL — ABNORMAL LOW (ref 3.5–5.0)
Alkaline Phosphatase: 68 U/L (ref 38–126)
Anion gap: 6 (ref 5–15)
BUN: 22 mg/dL (ref 8–23)
CO2: 25 mmol/L (ref 22–32)
Calcium: 8 mg/dL — ABNORMAL LOW (ref 8.9–10.3)
Chloride: 106 mmol/L (ref 98–111)
Creatinine, Ser: 1.14 mg/dL — ABNORMAL HIGH (ref 0.44–1.00)
GFR, Estimated: 51 mL/min — ABNORMAL LOW (ref >60)
Glucose, Bld: 120 mg/dL — ABNORMAL HIGH (ref 70–99)
Potassium: 4.5 mmol/L (ref 3.5–5.1)
Sodium: 137 mmol/L (ref 135–145)
Total Bilirubin: 0.2 mg/dL (ref 0.0–1.2)
Total Protein: 4.7 g/dL — ABNORMAL LOW (ref 6.5–8.1)

## 2024-06-15 LAB — TYPE AND SCREEN
ABO/RH(D): O POS
Antibody Screen: NEGATIVE
Unit division: 0
Unit division: 0
Unit division: 0

## 2024-06-15 LAB — GLUCOSE, CAPILLARY
Glucose-Capillary: 130 mg/dL — ABNORMAL HIGH (ref 70–99)
Glucose-Capillary: 133 mg/dL — ABNORMAL HIGH (ref 70–99)
Glucose-Capillary: 136 mg/dL — ABNORMAL HIGH (ref 70–99)
Glucose-Capillary: 145 mg/dL — ABNORMAL HIGH (ref 70–99)

## 2024-06-15 LAB — BPAM RBC
Blood Product Expiration Date: 202512212359
Blood Product Expiration Date: 202512222359
Blood Product Expiration Date: 202512232359
ISSUE DATE / TIME: 202511220508
ISSUE DATE / TIME: 202511231127
ISSUE DATE / TIME: 202511211250
Unit Type and Rh: 5100
Unit Type and Rh: 5100
Unit Type and Rh: 5100

## 2024-06-15 LAB — HEMOGLOBIN AND HEMATOCRIT, BLOOD
HCT: 28.4 % — ABNORMAL LOW (ref 36.0–46.0)
Hemoglobin: 9.3 g/dL — ABNORMAL LOW (ref 12.0–15.0)

## 2024-06-15 LAB — TRIGLYCERIDES: Triglycerides: 72 mg/dL (ref <150)

## 2024-06-15 LAB — MAGNESIUM: Magnesium: 2.3 mg/dL (ref 1.7–2.4)

## 2024-06-15 LAB — PHOSPHORUS: Phosphorus: 2.3 mg/dL — ABNORMAL LOW (ref 2.5–4.6)

## 2024-06-15 MED ORDER — TRAVASOL 10 % IV SOLN
INTRAVENOUS | Status: AC
  Filled 2024-06-15: qty 840

## 2024-06-15 MED ORDER — SODIUM PHOSPHATES 45 MMOLE/15ML IV SOLN
15.0000 mmol | Freq: Once | INTRAVENOUS | Status: AC
  Administered 2024-06-15: 15 mmol via INTRAVENOUS
  Filled 2024-06-15: qty 5

## 2024-06-15 NOTE — Anesthesia Postprocedure Evaluation (Signed)
 Anesthesia Post Note  Patient: Brooke [name]  Procedure(s) Performed: LAPAROSCOPIC ASSISTED RIGHT COLECTOMY (Abdomen)     Patient location during evaluation: PACU Anesthesia Type: General Level of consciousness: awake and alert Pain management: pain level controlled Vital Signs Assessment: post-procedure vital signs reviewed and stable Respiratory status: spontaneous breathing, nonlabored ventilation, respiratory function stable and patient connected to nasal cannula oxygen  Cardiovascular status: blood pressure returned to baseline and stable Postop Assessment: no apparent nausea or vomiting Anesthetic complications: no   No notable events documented.      Shela Esses D Trey Bebee

## 2024-06-15 NOTE — Progress Notes (Signed)
 Mobility Specialist - Progress Note   06/15/24 1255  Mobility  Activity Ambulated with assistance  Level of Assistance Contact guard assist, steadying assist  Assistive Device Front wheel walker  Distance Ambulated (ft) 50 ft  Range of Motion/Exercises Active  Activity Response Tolerated well  Mobility visit 1 Mobility  Mobility Specialist Start Time (ACUTE ONLY) 1255  Mobility Specialist Stop Time (ACUTE ONLY) 1308  Mobility Specialist Time Calculation (min) (ACUTE ONLY) 13 min   Pt was found on recliner chair and agreeable to mobilize. Grew fatigued and c/o abdominal pain. At EOS returned to recliner chair with all needs met. Call bell in reach.   Erminio Leos,  Mobility Specialist Can be reached via Secure Chat

## 2024-06-15 NOTE — Plan of Care (Signed)

## 2024-06-15 NOTE — Progress Notes (Signed)
 PROGRESS NOTE    Brooke [name]  FMW:[id number] DOB: 1951-04-25 DOA: 06/07/2024 PCP: Norleen Lynwood ORN, MD    Brief Narrative:   Brooke [name] is a 73 y.o. female with past medical history significant for HTN, HLD, DM2, asthma, anxiety, hypothyroidism, CKD stage IIIb, GERD who presented to [facility] ED on 06/07/2024 from home with complaint of abdominal pain associated with nausea and vomiting.  Poor appetite.  Pain is localized to the epigastric region and rated as a 6/10, sharp and constant.  No radiation.  Food exacerbates symptoms.  Denies diarrhea, no fever/chills.  Denies any blood in her stool.  Further denies chest pain, no palpitations, no shortness of breath, no cough, no urinary symptoms.  Last colonoscopy 12/2019 with diverticulosis of the sigmoid colon, otherwise normal.  No family history of colon cancer.  In the ED, temperature 98.1 F, HR 78, RR 18, BP 152/82, SpO2 99% on room air.  WBC 8.5, hemoglobin 10.3, platelet count 322.  Sodium 141, potassium 4.1, chloride 109, CO2 22, glucose 108, BUN 13, creat 1.61.  Alkaline phosphatase 180.  Lipase less than 10.  AST 27, ALT 12, total bilirubin 0.3.  Procalcitonin 0.33.  Lactic acid 2.0.  Vitamin B12 1004 and 76.  Iron 39, TIBC 207, ferritin 142.  Urinalysis with negative leukocytes negative nitrite, rare bacteria, 0-5 WBCs.  CT abdomen/pelvis with contrast with findings of enlarged appendix measuring 1.3 cm in diameter with enhancement and mild adjacent free fluid in the right lower quadrant concerning for mild acute appendicitis, focal circumferential narrowing with slight associated increased soft tissue density/enhancement over the ascending colon in the region of the hepatic flexure concerning for a focal mass/neoplasm, wall thickening of a moderate segment of distal ileum may be due to small bowel enteritis versus infectious versus inflammatory in nature, mild free peritoneal fluid, several liver cyst unchanged, 2.8 cm hyperdense mass over the  upper pole left kidney unchanged compatible with slightly hyperdense cyst, aortic atherosclerosis.  General surgery was consulted.  TRH consulted for admission for further evaluation and management.  Assessment & Plan:   Ascending colon mass, concerning for malignancy s/p lap assisted right colectomy with extended resection distal ileum, LOA Patient presenting with abdominal pain, nausea, vomiting and poor appetite.  Pain localized to the epigastric region.  Colonoscopy 2021 with diverticulosis of the sigmoid colon otherwise unrevealing.  CT abdomen/pelvis on admission with findings enlarged appendix measuring 1.3 cm in diameter with enhancement and mild adjacent free fluid in the right lower quadrant concerning for mild acute appendicitis, focal circumferential narrowing with slight associated increased soft tissue density/enhancement over the ascending colon in the region of the hepatic flexure concerning for a focal mass/neoplasm, wall thickening of a moderate segment of distal ileum may be due to small bowel enteritis versus infectious versus inflammatory in nature, mild free peritoneal fluid.  Colonoscopy 06/11/2024 with findings of a severe ulcerated stricture at the hepatic flexure concerning for malignancy, 4 mm polyp descending colon removed with cold snare.  Patient underwent laparoscopic-assisted right colectomy with extended resection of distal ileum and LOA by Dr. Tanda on 11/21. -- [facility], [facility] following; appreciate assistance -- Colon biopsy pathology 11/20: Pending -- Pathology from right colectomy 11/21: PET -- Zofran  as needed nausea/vomiting -- Morphine  2 mg IV every 2 hours as needed severe pain -- ice chips, continues on TPN; further advancement per general surgery  Acute appendicitis, ruled out Atypical findings on CT, seen by general surgery and clinically without signs or symptoms of  acute appendicitis, suspect area of concern related to above mass and obstructive  pathology.  Hypokalemia Repleted, potassium 4.5 this morning. -- Repeat electrolytes in a.m.  Hypophosphatemia Repleted.  Phosphorus 2.3 this morning.  On TPN.  Hypomagnesemia Repleted, magnesium  2.3 this morning.  Acute on chronic normocytic anemia Acute postoperative blood loss anemia Anemia panel with iron 39, TIBC 207, ferritin 142, vitamin B12 1476.  -- Hgb 10.3>>8.3>8.2>7.9>8.7>6.4>8.8>7.4>7.2>8.6>8.2 -- Transfused 3 units PRBC (11/21, 11/22, 11/23) -- Transfuse for hemoglobin less than 7.0 -- CBC in a.m.  HTN -- Diltiazem  180 mg p.o. daily -- Metoprolol  succinate 100 mg p.o. daily  HLD -- Crestor  40 mg p.o. daily  DM2 Hemoglobin A1c 5.4. -- Sensitive SSI for coverage -- CBG every 4 hours  CKD stage IIIb Baseline creatinine 1.5-1.9, stable. -- Cr 1.82>1.61>1.38>1.30>1.26>1.14 -- On TPN -- BMP in a.m.  Depression with anxiety -- Celexa  40 mg p.o. daily  Asthma -- Breztri  2 puffs twice daily (substituted for home Trelegy)  GERD -- Protonix  40 mg IV every 24 hours  Obesity, class I Body mass index is 34.38 kg/m.   DVT prophylaxis: SCDs Start: 06/07/24 2000 Place TED hose Start: 06/07/24 2000    Code Status: Full Code Family Communication: No family present at bedside this am  Disposition Plan:  Level of care: Progressive Status is: Inpatient Remains inpatient appropriate because: TPN, needs advancement of diet and stabilization of hemoglobin for stable for discharge home  Consultants:  [place] [facility] General Surgery  Procedures:  Colonoscopy 11/20  laparoscopic-assisted right colectomy with extended resection of distal ileum and LOA; Dr. Tanda, 11/21  Antimicrobials:  Perioperative cefotetan    Subjective: Patient seen examined bedside, sitting in bedside chair.  Reports bowel movements yesterday.  Hemoglobin stable, 8.2 this morning.  Remains on ice chips and TPN.  No other specific questions, concerns or complaints at this  time.  Denies headache, no visual changes, no chest pain, no palpitations, no shortness of breath, no abdominal pain, no fever/chills/night sweats, no focal weakness, no fatigue, no paresthesia.  No acute events overnight per nursing staff.   Objective: Vitals:   06/14/24 1409 06/14/24 2029 06/15/24 0500 06/15/24 0516  BP: (!) 142/76 (!) 171/75  (!) 163/67  Pulse: 94 89  80  Resp: 20 16  18   Temp: 98.1 F (36.7 C) 98.7 F (37.1 C)  98.3 F (36.8 C)  TempSrc: Oral     SpO2: 96% 97%  100%  Weight:   88 kg   Height:        Intake/Output Summary (Last 24 hours) at 06/15/2024 1154 Last data filed at 06/15/2024 0630 Gross per 24 hour  Intake 2561.75 ml  Output 100 ml  Net 2461.75 ml   Filed Weights   06/13/24 0509 06/14/24 0500 06/15/24 0500  Weight: 87.2 kg 92.7 kg 88 kg    Examination:  Physical Exam: GEN: NAD, alert and oriented x 3, obese  HEENT: NCAT, PERRL, EOMI, sclera clear, MMM PULM: CTAB w/o wheezes/crackles, normal respiratory effort, on room air CV: RRR w/o M/G/R GI: abd soft, NTND, faint bowel sounds; abdominal surgical incision site noted with honeycomb dressing in place, clean/dry/intact  MSK: no peripheral edema, moves all extremities independently NEURO: No focal neurological deficit PSYCH: normal mood/affect Integumentary: dry/intact, no rashes or wounds    Data Reviewed: I have personally reviewed following labs and imaging studies  CBC: Recent Labs  Lab 06/12/24 0259 06/12/24 2006 06/13/24 0402 06/13/24 1235 06/13/24 2214 06/14/24 0221 06/14/24 2130 06/15/24 0249  WBC  6.4  --  12.2* 17.4*  --  16.5*  --  11.8*  HGB 7.9*   < > 6.4* 8.8* 7.4* 7.2* 8.6* 8.2*  HCT 25.0*   < > 20.3* 26.0* 22.0* 21.9* 26.3* 25.2*  MCV 86.5  --  85.7 85.0  --  84.9  --  86.3  PLT 155  --  141* 135*  --  124*  --  117*   < > = values in this interval not displayed.   Basic Metabolic Panel: Recent Labs  Lab 06/11/24 0407 06/12/24 0018 06/13/24 0402  06/14/24 0221 06/15/24 0249  NA 138 140 138 138 137  K 3.7 3.7 4.7 4.9 4.5  CL 108 112* 110 107 106  CO2 21* 22 22 25 25   GLUCOSE 97 128* 220* 146* 120*  BUN 17 14 18 22 22   CREATININE 1.64* 1.38* 1.30* 1.26* 1.14*  CALCIUM  7.7* 7.7* 7.6* 8.0* 8.0*  MG 2.0 1.8 1.6* 2.6* 2.3  PHOS 2.7 1.8* 2.9 2.7 2.3*   GFR: Estimated Creatinine Clearance: 46.9 mL/min (A) (by C-G formula based on SCr of 1.14 mg/dL (H)). Liver Function Tests: Recent Labs  Lab 06/10/24 0343 06/11/24 0407 06/12/24 0018 06/13/24 0402 06/15/24 0249  AST 14* 13* 12* 15 18  ALT 9 9 9 11 14   ALKPHOS 127* 115 91 60 68  BILITOT <0.2 <0.2 <0.2 <0.2 0.2  PROT 4.4* 4.5* 4.0* 4.1* 4.7*  ALBUMIN  2.2* 2.2* 2.0* 2.4* 2.2*   No results for input(s): LIPASE, AMYLASE in the last 168 hours.  No results for input(s): AMMONIA in the last 168 hours. Coagulation Profile: No results for input(s): INR, PROTIME in the last 168 hours.  Cardiac Enzymes: No results for input(s): CKTOTAL, CKMB, CKMBINDEX, TROPONINI in the last 168 hours. BNP (last 3 results) No results for input(s): PROBNP in the last 8760 hours. HbA1C: No results for input(s): HGBA1C in the last 72 hours. CBG: Recent Labs  Lab 06/14/24 0606 06/14/24 1201 06/14/24 1827 06/15/24 0009 06/15/24 0553  GLUCAP 149* 144* 136* 130* 145*   Lipid Profile: Recent Labs    06/15/24 0249  TRIG 72   Thyroid  Function Tests: No results for input(s): TSH, T4TOTAL, FREET4, T3FREE, THYROIDAB in the last 72 hours. Anemia Panel: No results for input(s): VITAMINB12, FOLATE, FERRITIN, TIBC, IRON, RETICCTPCT in the last 72 hours.  Sepsis Labs: No results for input(s): PROCALCITON, LATICACIDVEN in the last 168 hours.   Recent Results (from the past 240 hours)  Surgical pcr screen     Status: Abnormal   Collection Time: 06/12/24 10:55 AM   Specimen: Nasal Mucosa; Nasal Swab  Result Value Ref Range Status   MRSA, PCR  POSITIVE (A) NEGATIVE Final    Comment: RESULT CALLED TO, READ BACK BY AND VERIFIED WITH:  TANDA, E 06/12/2024 1257 AJ    Staphylococcus aureus POSITIVE (A) NEGATIVE Final    Comment: (NOTE) The Xpert SA Assay (FDA approved for NASAL specimens in patients 45 years of age and older), is one component of a comprehensive surveillance program. It is not intended to diagnose infection nor to guide or monitor treatment. Performed at [facility], [address] [place], [place] [address]          Radiology Studies: No results found.       Scheduled Meds:  allopurinol   100 mg Oral Daily   atropine   1 drop Left Eye Daily   azelastine   2 spray Each Nare BID   brimonidine   1 drop  Left Eye BID   budesonide -glycopyrrolate -formoterol   2 puff Inhalation BID   Chlorhexidine  Gluconate Cloth  6 each Topical Daily   citalopram   40 mg Oral Daily   diltiazem   180 mg Oral Daily   erythromycin    Left Eye QHS   insulin  aspart  0-9 Units Subcutaneous Q6H   latanoprost   1 drop Left Eye QHS   loratadine   10 mg Oral Daily   metoprolol  succinate  100 mg Oral Daily   mupirocin  ointment  1 Application Nasal BID   pantoprazole  (PROTONIX ) IV  40 mg Intravenous Q24H   rosuvastatin   40 mg Oral Daily   sodium bicarbonate   1,300 mg Oral Daily   Continuous Infusions:  sodium PHOSPHATE  IVPB (in mmol) 15 mmol (06/15/24 0903)   TPN ADULT (ION) 70 mL/hr at 06/15/24 0630   TPN ADULT (ION)       LOS: 8 days    Time spent: 50 minutes spent on 06/15/2024 caring for this patient face-to-face including chart review, ordering labs/tests, documenting, discussion with nursing staff, consultants, updating family and interview/physical exam    Camellia PARAS Meygan Kyser, DO [facility] Available via Epic secure chat 7am-7pm After these hours, please refer to coverage provider listed on [email or web address] 06/15/2024, 11:54 AM

## 2024-06-15 NOTE — Plan of Care (Incomplete)
  Problem: Education: Goal: Knowledge of General Education information will improve Description: Including pain rating scale, medication(s)/side effects and non-pharmacologic comfort measures Outcome: Progressing   Problem: Health Behavior/Discharge Planning: Goal: Ability to manage health-related needs will improve Outcome: Progressing   Problem: Clinical Measurements: Goal: Ability to maintain clinical measurements within normal limits will improve Outcome: Progressing Goal: Will remain free from infection Outcome: Progressing Goal: Diagnostic test results will improve Outcome: Progressing   Problem: Activity: Goal: Risk for activity intolerance will decrease Outcome: Progressing   Problem: Nutrition: Goal: Adequate nutrition will be maintained Outcome: Progressing   Problem: Pain Managment: Goal: General experience of comfort will improve and/or be controlled Outcome: Progressing   Problem: Safety: Goal: Ability to remain free from injury will improve Outcome: Progressing   Problem: Clinical Measurements: Goal: Respiratory complications will improve Outcome: Adequate for Discharge Goal: Cardiovascular complication will be avoided Outcome: Adequate for Discharge   Problem: Coping: Goal: Level of anxiety will decrease Outcome: Adequate for Discharge   Problem: Elimination: Goal: Will not experience complications related to bowel motility Outcome: Adequate for Discharge Goal: Will not experience complications related to urinary retention Outcome: Adequate for Discharge   Problem: Skin Integrity: Goal: Risk for impaired skin integrity will decrease Outcome: Adequate for Discharge

## 2024-06-15 NOTE — Progress Notes (Signed)
 Progress Note  3 Days Post-Op  Subjective: Patient reports that pain is manageable. Denies pain currently. Had 2 BM this morning and having flatulence. Denies n/v. Patient currently NPO.   ROS  All negative with the exception of above.  Objective: Vital signs in last 24 hours: Temp:  [98.1 F (36.7 C)-98.7 F (37.1 C)] 98.3 F (36.8 C) (11/24 0516) Pulse Rate:  [80-94] 80 (11/24 0516) Resp:  [16-20] 18 (11/24 0516) BP: (142-171)/(67-76) 163/67 (11/24 0516) SpO2:  [96 %-100 %] 100 % (11/24 0516) Weight:  [88 kg] 88 kg (11/24 0500) Last BM Date : 06/15/24  Intake/Output from previous day: 11/23 0701 - 11/24 0700 In: 2561.8 [P.O.:80; I.V.:1764.1; Blood:404.9; IV Piggyback:312.7] Out: 100 [Urine:100] Intake/Output this shift: No intake/output data recorded.  PE: General: Pleasant female who is sitting in chair at bedside in NAD. HEENT: Head is normocephalic, atraumatic.  Heart: Normal HR during encounter. Lungs: Respiratory effort nonlabored. Abd: Soft, NT, mild distention. Midline incision present with staples covered with honeycomb dressing. No concern for infection or active bleeding. No rebound tenderness or guarding.  MS: Able to move all extremities.  Skin: Warm and dry.  Psych: A&Ox3 with an appropriate affect.    Lab Results:  Recent Labs    06/14/24 0221 06/14/24 2130 06/15/24 0249  WBC 16.5*  --  11.8*  HGB 7.2* 8.6* 8.2*  HCT 21.9* 26.3* 25.2*  PLT 124*  --  117*   BMET Recent Labs    06/14/24 0221 06/15/24 0249  NA 138 137  K 4.9 4.5  CL 107 106  CO2 25 25  GLUCOSE 146* 120*  BUN 22 22  CREATININE 1.26* 1.14*  CALCIUM  8.0* 8.0*   PT/INR No results for input(s): LABPROT, INR in the last 72 hours. CMP     Component Value Date/Time   NA 137 06/15/2024 0249   NA 143 06/01/2024 0958   K 4.5 06/15/2024 0249   CL 106 06/15/2024 0249   CO2 25 06/15/2024 0249   GLUCOSE 120 (H) 06/15/2024 0249   GLUCOSE 96 05/27/2006 1052   BUN 22  06/15/2024 0249   BUN 16 06/01/2024 0958   CREATININE 1.14 (H) 06/15/2024 0249   CREATININE 0.90 01/01/2014 1046   CALCIUM  8.0 (L) 06/15/2024 0249   PROT 4.7 (L) 06/15/2024 0249   PROT 4.8 (L) 06/01/2024 0958   ALBUMIN  2.2 (L) 06/15/2024 0249   ALBUMIN  2.5 (L) 06/01/2024 0958   AST 18 06/15/2024 0249   ALT 14 06/15/2024 0249   ALKPHOS 68 06/15/2024 0249   BILITOT 0.2 06/15/2024 0249   BILITOT 0.2 06/01/2024 0958   GFRNONAA 51 (L) 06/15/2024 0249   GFRAA 39 (L) 09/24/2018 1054   Lipase     Component Value Date/Time   LIPASE <10 (L) 06/07/2024 1350       Studies/Results: No results found.  Anti-infectives: Anti-infectives (From admission, onward)    Start     Dose/Rate Route Frequency Ordered Stop   06/12/24 1000  cefoTEtan  (CEFOTAN ) 2 g in sodium chloride  0.9 % 100 mL IVPB        2 g 200 mL/hr over 30 Minutes Intravenous On call to O.R. 06/12/24 0948 06/12/24 1315        Assessment/Plan POD3: S/P LAPAROSCOPIC ASSISTED  RIGHT COLECTOMY WITH EXTENDED RESECTION OF DISTAL ILEUM, Laparoscopic lysis of adhesions for 20-minute by Dr. Tanda 11/21 -Afebrile.  -WBC 11.8 from 16.5 -HGB 8.2 from 8.6. Continue to monitor.  -Surgical pathology pending.  -No n/v since removal  of NG. Had BM this morning. Pain manageable. -Tolerated ice chips. Will discuss with attending moving to CLD.  -Mobilize as tolerated, pulm toilet -Cr 1.14 from 1.26. Stable. -Continue TPN -Will continue to follow.    FEN: NPO with ice chips; IVF per primary team VTE: Hold DVT ppx ID: None currently.    LOS: 8 days   I reviewed specialist notes, consulting provider notes, hospitalist notes, last 24 h vitals and pain scores, last 48 h intake and output, last 24 h labs and trends, and last 24 h imaging results.   [name], [facility] 06/15/2024, 12:36 PM Please see Amion for pager number during day hours 7:00am-4:30pm

## 2024-06-15 NOTE — Progress Notes (Signed)
 PHARMACY - TOTAL PARENTERAL NUTRITION CONSULT NOTE   Indication: Planned SBR  Patient Measurements: Height: 5' 3 (160 cm) Weight: 88 kg (194 lb 1.6 oz) IBW/kg (Calculated) : 52.4 TPN AdjBW (KG): 60.9 Body mass index is 34.38 kg/m.  Assessment: Pt presented with abdominal pain, N/V, unable to tolerate PO intake. Pt s/p colonoscopy with concern for malignancy (pathology pending) as well as colectomy. Pharmacy consulted to manage TPN.  Glucose / Insulin : hx T2DM, not on medications PTA. A1c 5.4 -BG goal <180. Range: 120 - 149 (4 units SSI/24 hrs) -Dexamethasone  IV x1 dose pre-op on 11/21 Electrolytes: Phos (2.3) low -All other lytes WNL, including CorrCa (9.4) Renal: CKD. SCr lower than baseline, BUN WNL/stable Hepatic: LFTs, Alk Phos, T.bili WNL. Albumin  low Intake / Output; MIVF: no mIVF -UOP: 100 mL + unmeasured. Stool x1. NG removed GI Imaging: - 11/16 CT:  Enlarged appendix concerning for mild acute appendicitis. Concern for mass/neoplasm in ascending colon. 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 Mild diverticulosis with no evidence of diverticular bleeding.  - 11/21 laparoscopic right colectomy with extended resection of distal ileum  Central access: PICC 11/18 TPN start date: 11/18  Nutritional Goals: Goal TPN rate is 91mL/hr (provides 84 g of protein and 1700 kcals per day)  RD Assessment: TBD Estimated Needs Total Energy Estimated Needs: 1550-1750 Total Protein Estimated Needs: 75-85g Total Fluid Estimated Needs: 1.7L/day  Current Nutrition:  TPN NPO except for sips with meds  Plan:  Now: Na Phos 15 mmol IV once  At 1800: Continue TPN at goal rate of 70 mL/hr Electrolytes in TPN: Increase Phos Na 50 mEq/L, K 40 mEq/L, Ca 30mEq/L, Mg 4 mEq/L, Phos 20 mmol/L Cl:Ac 1:1  Add standard MVI and trace elements to TPN continue Sensitive q6h SSI and adjust as  needed Monitor TPN labs on Mon/Thurs. Recheck electrolytes with AM labs tomorrow.   Ronal CHRISTELLA Rav, PharmD 06/15/24 8:00 AM

## 2024-06-16 DIAGNOSIS — K6389 Other specified diseases of intestine: Secondary | ICD-10-CM | POA: Diagnosis not present

## 2024-06-16 LAB — GLUCOSE, CAPILLARY
Glucose-Capillary: 133 mg/dL — ABNORMAL HIGH (ref 70–99)
Glucose-Capillary: 143 mg/dL — ABNORMAL HIGH (ref 70–99)
Glucose-Capillary: 146 mg/dL — ABNORMAL HIGH (ref 70–99)
Glucose-Capillary: 152 mg/dL — ABNORMAL HIGH (ref 70–99)
Glucose-Capillary: 154 mg/dL — ABNORMAL HIGH (ref 70–99)

## 2024-06-16 LAB — PHOSPHORUS: Phosphorus: 2.8 mg/dL (ref 2.5–4.6)

## 2024-06-16 LAB — BASIC METABOLIC PANEL WITH GFR
Anion gap: 8 (ref 5–15)
BUN: 19 mg/dL (ref 8–23)
CO2: 24 mmol/L (ref 22–32)
Calcium: 8 mg/dL — ABNORMAL LOW (ref 8.9–10.3)
Chloride: 108 mmol/L (ref 98–111)
Creatinine, Ser: 1.1 mg/dL — ABNORMAL HIGH (ref 0.44–1.00)
GFR, Estimated: 53 mL/min — ABNORMAL LOW (ref >60)
Glucose, Bld: 125 mg/dL — ABNORMAL HIGH (ref 70–99)
Potassium: 4 mmol/L (ref 3.5–5.1)
Sodium: 140 mmol/L (ref 135–145)

## 2024-06-16 LAB — CBC
HCT: 25.5 % — ABNORMAL LOW (ref 36.0–46.0)
Hemoglobin: 8.2 g/dL — ABNORMAL LOW (ref 12.0–15.0)
MCH: 28.3 pg (ref 26.0–34.0)
MCHC: 32.2 g/dL (ref 30.0–36.0)
MCV: 87.9 fL (ref 80.0–100.0)
Platelets: 135 10*3/uL — ABNORMAL LOW (ref 150–400)
RBC: 2.9 MIL/uL — ABNORMAL LOW (ref 3.87–5.11)
RDW: 16.5 % — ABNORMAL HIGH (ref 11.5–15.5)
WBC: 11 10*3/uL — ABNORMAL HIGH (ref 4.0–10.5)
nRBC: 0.7 % — ABNORMAL HIGH (ref 0.0–0.2)

## 2024-06-16 LAB — MAGNESIUM: Magnesium: 1.7 mg/dL (ref 1.7–2.4)

## 2024-06-16 MED ORDER — MAGNESIUM SULFATE 2 GM/50ML IV SOLN
2.0000 g | Freq: Once | INTRAVENOUS | Status: AC
  Administered 2024-06-16: 2 g via INTRAVENOUS
  Filled 2024-06-16: qty 50

## 2024-06-16 MED ORDER — ACETAMINOPHEN 325 MG PO TABS
650.0000 mg | ORAL_TABLET | Freq: Once | ORAL | Status: AC | PRN
  Administered 2024-06-17: 650 mg via ORAL
  Filled 2024-06-16: qty 2

## 2024-06-16 MED ORDER — ACETAMINOPHEN 500 MG PO TABS
1000.0000 mg | ORAL_TABLET | Freq: Once | ORAL | Status: AC
  Administered 2024-06-16: 1000 mg via ORAL
  Filled 2024-06-16: qty 2

## 2024-06-16 MED ORDER — PHENYLEPHRINE HCL-NACL 20-0.9 MG/250ML-% IV SOLN
INTRAVENOUS | Status: AC
  Filled 2024-06-16: qty 500

## 2024-06-16 MED ORDER — TRAVASOL 10 % IV SOLN
INTRAVENOUS | Status: AC
  Filled 2024-06-16: qty 840

## 2024-06-16 NOTE — Progress Notes (Signed)
 PROGRESS NOTE    Brooke [name]  FMW:[id number] DOB: [birth year] DOA: 06/07/2024 PCP: Norleen Lynwood ORN, MD    Brief Narrative:   Brooke [name] is a 73 y.o. female with past medical history significant for HTN, HLD, DM2, asthma, anxiety, hypothyroidism, CKD stage IIIb, GERD who presented to [facility] ED on 06/07/2024 from home with complaint of abdominal pain associated with nausea and vomiting.  Poor appetite.  Pain is localized to the epigastric region and rated as a 6/10, sharp and constant.  No radiation.  Food exacerbates symptoms.  Denies diarrhea, no fever/chills.  Denies any blood in her stool.  Further denies chest pain, no palpitations, no shortness of breath, no cough, no urinary symptoms.  Last colonoscopy 12/2019 with diverticulosis of the sigmoid colon, otherwise normal.  No family history of colon cancer.  In the ED, temperature 98.1 F, HR 78, RR 18, BP 152/82, SpO2 99% on room air.  WBC 8.5, hemoglobin 10.3, platelet count 322.  Sodium 141, potassium 4.1, chloride 109, CO2 22, glucose 108, BUN 13, creat 1.61.  Alkaline phosphatase 180.  Lipase less than 10.  AST 27, ALT 12, total bilirubin 0.3.  Procalcitonin 0.33.  Lactic acid 2.0.  Vitamin B12 1004 and 76.  Iron 39, TIBC 207, ferritin 142.  Urinalysis with negative leukocytes negative nitrite, rare bacteria, 0-5 WBCs.  CT abdomen/pelvis with contrast with findings of enlarged appendix measuring 1.3 cm in diameter with enhancement and mild adjacent free fluid in the right lower quadrant concerning for mild acute appendicitis, focal circumferential narrowing with slight associated increased soft tissue density/enhancement over the ascending colon in the region of the hepatic flexure concerning for a focal mass/neoplasm, wall thickening of a moderate segment of distal ileum may be due to small bowel enteritis versus infectious versus inflammatory in nature, mild free peritoneal fluid, several liver cyst unchanged, 2.8 cm hyperdense mass over the  upper pole left kidney unchanged compatible with slightly hyperdense cyst, aortic atherosclerosis.  General surgery was consulted.  TRH consulted for admission for further evaluation and management.  Assessment & Plan:   Ascending colon mass 2/2 invasive adenocarcinoma, moderately differentiated s/p laparoscopic assisted right colectomy with extended resection distal ileum, lysis of adhesion Patient presenting with abdominal pain, nausea, vomiting and poor appetite.  Pain localized to the epigastric region.  Colonoscopy 2021 with diverticulosis of the sigmoid colon otherwise unrevealing.  CT abdomen/pelvis on admission with findings enlarged appendix measuring 1.3 cm in diameter with enhancement and mild adjacent free fluid in the right lower quadrant concerning for mild acute appendicitis, focal circumferential narrowing with slight associated increased soft tissue density/enhancement over the ascending colon in the region of the hepatic flexure concerning for a focal mass/neoplasm, wall thickening of a moderate segment of distal ileum may be due to small bowel enteritis versus infectious versus inflammatory in nature, mild free peritoneal fluid.  Colonoscopy 06/11/2024 with findings of a severe ulcerated stricture at the hepatic flexure concerning for malignancy, 4 mm polyp descending colon removed with cold snare.  Patient underwent laparoscopic-assisted right colectomy with extended resection of distal ileum and LOA by Dr. Tanda on 11/21.  Pathology with invasive adenocarcinoma, moderately differentiated, tumor invading pericolorectal soft tissue with lymphovascular invasion identified, perineural invasion not identified, surgical margins of resection are negative for carcinoma, 12 lymph nodes negative for metastatic carcinoma. -- [facility] following; appreciate assistance -- Zofran  as needed nausea/vomiting -- Morphine  2 mg IV every 2 hours as needed severe pain -- Advancing to full liquid  diet,  further per general surgery  Acute appendicitis, ruled out Atypical findings on CT, seen by general surgery and clinically without signs or symptoms of acute appendicitis, suspect area of concern related to above mass and obstructive pathology.  Hypokalemia Repleted, potassium 4.5 this morning. -- Repeat electrolytes in a.m.  Hypophosphatemia Repleted.  Phosphorus 2.3 this morning.  On TPN.  Hypomagnesemia Repleted, magnesium  2.3 this morning.  Acute on chronic normocytic anemia Acute postoperative blood loss anemia Anemia panel with iron 39, TIBC 207, ferritin 142, vitamin B12 1476.  -- Hgb 10.3>>8.3>8.2>7.9>8.7>6.4>8.8>7.4>7.2>8.6>8.2>9.3>8.2 -- Transfused 3 units PRBC (11/21, 11/22, 11/23) -- Transfuse for hemoglobin less than 7.0 -- CBC in a.m.  HTN -- Diltiazem  180 mg p.o. daily -- Metoprolol  succinate 100 mg p.o. daily  HLD -- Crestor  40 mg p.o. daily  DM2 Hemoglobin A1c 5.4. -- Sensitive SSI for coverage -- CBG every 4 hours  CKD stage IIIb Baseline creatinine 1.5-1.9, stable. -- Cr 1.82>1.61>1.38>1.30>1.26>1.14>1.10 -- On TPN -- BMP in a.m.  Depression with anxiety -- Celexa  40 mg p.o. daily  Asthma -- Breztri  2 puffs twice daily (substituted for home Trelegy)  GERD -- Protonix  40 mg IV every 24 hours  Obesity, class I Body mass index is 34.13 kg/m.   DVT prophylaxis: SCDs Start: 06/07/24 2000 Place TED hose Start: 06/07/24 2000    Code Status: Full Code Family Communication: No family present at bedside this am  Disposition Plan:  Level of care: Progressive Status is: Inpatient Remains inpatient appropriate because: TPN, needs advancement of diet and stabilization of hemoglobin for stable for discharge home  Consultants:  [facility] General Surgery  Procedures:  Colonoscopy 11/20  laparoscopic-assisted right colectomy with extended resection of distal ileum and LOA; Dr. Tanda, 11/21  Antimicrobials:  Perioperative  cefotetan    Subjective: Patient seen examined bedside, sitting in bedside chair.  Reports several bowel movements overnight and this morning.  Hemoglobin 8.2.  Seen by general surgery with advancement of diet to full liquids.  Remains on TPN.No other specific questions, concerns or complaints at this time.  Denies headache, no visual changes, no chest pain, no palpitations, no shortness of breath, no abdominal pain, no fever/chills/night sweats, no focal weakness, no fatigue, no paresthesia.  No acute events overnight per nursing staff.   Objective: Vitals:   06/15/24 2206 06/16/24 0427 06/16/24 0852 06/16/24 0900  BP: (!) 143/60 (!) 151/67    Pulse: 92 93    Resp: 19 18    Temp: (!) 100.4 F (38 C) 99.5 F (37.5 C)    TempSrc: Oral Oral    SpO2: 99% 99%  97%  Weight:   87.4 kg   Height:        Intake/Output Summary (Last 24 hours) at 06/16/2024 1232 Last data filed at 06/15/2024 1836 Gross per 24 hour  Intake 1003.04 ml  Output --  Net 1003.04 ml   Filed Weights   06/14/24 0500 06/15/24 0500 06/16/24 0852  Weight: 92.7 kg 88 kg 87.4 kg    Examination:  Physical Exam: GEN: NAD, alert and oriented x 3, obese  HEENT: NCAT, PERRL, EOMI, sclera clear, MMM PULM: CTAB w/o wheezes/crackles, normal respiratory effort, on room air CV: RRR w/o M/G/R GI: abd soft, NTND, faint bowel sounds; abdominal surgical incision site noted with honeycomb dressing in place, clean/dry/intact  MSK: no peripheral edema, moves all extremities independently NEURO: No focal neurological deficit PSYCH: normal mood/affect Integumentary: dry/intact, no rashes or wounds    Data Reviewed: I have personally reviewed  following labs and imaging studies  CBC: Recent Labs  Lab 06/13/24 0402 06/13/24 1235 06/13/24 2214 06/14/24 0221 06/14/24 2130 06/15/24 0249 06/15/24 1939 06/16/24 0446  WBC 12.2* 17.4*  --  16.5*  --  11.8*  --  11.0*  HGB 6.4* 8.8*   < > 7.2* 8.6* 8.2* 9.3* 8.2*  HCT 20.3*  26.0*   < > 21.9* 26.3* 25.2* 28.4* 25.5*  MCV 85.7 85.0  --  84.9  --  86.3  --  87.9  PLT 141* 135*  --  124*  --  117*  --  135*   < > = values in this interval not displayed.   Basic Metabolic Panel: Recent Labs  Lab 06/12/24 0018 06/13/24 0402 06/14/24 0221 06/15/24 0249 06/16/24 0446  NA 140 138 138 137 140  K 3.7 4.7 4.9 4.5 4.0  CL 112* 110 107 106 108  CO2 22 22 25 25 24   GLUCOSE 128* 220* 146* 120* 125*  BUN 14 18 22 22 19   CREATININE 1.38* 1.30* 1.26* 1.14* 1.10*  CALCIUM  7.7* 7.6* 8.0* 8.0* 8.0*  MG 1.8 1.6* 2.6* 2.3 1.7  PHOS 1.8* 2.9 2.7 2.3* 2.8   GFR: Estimated Creatinine Clearance: 48.5 mL/min (A) (by C-G formula based on SCr of 1.1 mg/dL (H)). Liver Function Tests: Recent Labs  Lab 06/10/24 0343 06/11/24 0407 06/12/24 0018 06/13/24 0402 06/15/24 0249  AST 14* 13* 12* 15 18  ALT 9 9 9 11 14   ALKPHOS 127* 115 91 60 68  BILITOT <0.2 <0.2 <0.2 <0.2 0.2  PROT 4.4* 4.5* 4.0* 4.1* 4.7*  ALBUMIN  2.2* 2.2* 2.0* 2.4* 2.2*   No results for input(s): LIPASE, AMYLASE in the last 168 hours.  No results for input(s): AMMONIA in the last 168 hours. Coagulation Profile: No results for input(s): INR, PROTIME in the last 168 hours.  Cardiac Enzymes: No results for input(s): CKTOTAL, CKMB, CKMBINDEX, TROPONINI in the last 168 hours. BNP (last 3 results) No results for input(s): PROBNP in the last 8760 hours. HbA1C: No results for input(s): HGBA1C in the last 72 hours. CBG: Recent Labs  Lab 06/15/24 1200 06/15/24 1821 06/16/24 0018 06/16/24 0709 06/16/24 1214  GLUCAP 133* 136* 146* 152* 154*   Lipid Profile: Recent Labs    06/15/24 0249  TRIG 72   Thyroid  Function Tests: No results for input(s): TSH, T4TOTAL, FREET4, T3FREE, THYROIDAB in the last 72 hours. Anemia Panel: No results for input(s): VITAMINB12, FOLATE, FERRITIN, TIBC, IRON, RETICCTPCT in the last 72 hours.  Sepsis Labs: No results for  input(s): PROCALCITON, LATICACIDVEN in the last 168 hours.   Recent Results (from the past 240 hours)  Surgical pcr screen     Status: Abnormal   Collection Time: 06/12/24 10:55 AM   Specimen: Nasal Mucosa; Nasal Swab  Result Value Ref Range Status   MRSA, PCR POSITIVE (A) NEGATIVE Final    Comment: RESULT CALLED TO, READ BACK BY AND VERIFIED WITH:  TANDA, E 06/12/2024 1257 AJ    Staphylococcus aureus POSITIVE (A) NEGATIVE Final    Comment: (NOTE) The Xpert SA Assay (FDA approved for NASAL specimens in patients 75 years of age and older), is one component of a comprehensive surveillance program. It is not intended to diagnose infection nor to guide or monitor treatment. Performed at [facility], [address] [place], [place] [address]          Radiology Studies: No results found.       Scheduled Meds:  allopurinol   100 mg Oral Daily   atropine   1 drop Left Eye Daily   azelastine   2 spray Each Nare BID   brimonidine   1 drop Left Eye BID   budesonide -glycopyrrolate -formoterol   2 puff Inhalation BID   citalopram   40 mg Oral Daily   diltiazem   180 mg Oral Daily   erythromycin    Left Eye QHS   insulin  aspart  0-9 Units Subcutaneous Q6H   latanoprost   1 drop Left Eye QHS   loratadine   10 mg Oral Daily   metoprolol  succinate  100 mg Oral Daily   mupirocin  ointment  1 Application Nasal BID   pantoprazole  (PROTONIX ) IV  40 mg Intravenous Q24H   phenylephrine        rosuvastatin   40 mg Oral Daily   sodium bicarbonate   1,300 mg Oral Daily   Continuous Infusions:  TPN ADULT (ION) 70 mL/hr at 06/15/24 1836   TPN ADULT (ION)       LOS: 9 days    Time spent: 50 minutes spent on 06/16/2024 caring for this patient face-to-face including chart review, ordering labs/tests, documenting, discussion with nursing staff, consultants, updating family and interview/physical exam    Camellia PARAS Rima Blizzard, DO [facility] Available via Epic secure  chat 7am-7pm After these hours, please refer to coverage provider listed on [email or web address] 06/16/2024, 12:32 PM

## 2024-06-16 NOTE — Progress Notes (Signed)
 Progress Note  4 Days Post-Op  Subjective: Patient reports pain is manageable. Tolerated CLD without nausea and vomiting. Had BM this morning with flatulence.   ROS  All negative with the exception of above.  Objective: Vital signs in last 24 hours: Temp:  [98.8 F (37.1 C)-100.4 F (38 C)] 99.5 F (37.5 C) (11/25 0427) Pulse Rate:  [73-93] 93 (11/25 0427) Resp:  [18-20] 18 (11/25 0427) BP: (143-151)/(60-69) 151/67 (11/25 0427) SpO2:  [97 %-100 %] 97 % (11/25 0900) FiO2 (%):  [21 %] 21 % (11/25 0900) Weight:  [87.4 kg] 87.4 kg (11/25 0852) Last BM Date : 06/16/24  Intake/Output from previous day: 11/24 0701 - 11/25 0700 In: 1003 [I.V.:748; IV Piggyback:255] Out: -  Intake/Output this shift: No intake/output data recorded.  PE: General: Pleasant female who is sitting in chair at bedside in NAD. HEENT: Head is normocephalic, atraumatic.  Heart: Normal HR during encounter. Lungs: Respiratory effort nonlabored. Abd: Soft, NT, mild distention. Midline incision present with staples covered with honeycomb dressing. No concern for infection or active bleeding. No rebound tenderness or guarding.  MS: Able to move all extremities.  Skin: Warm and dry.  Psych: A&Ox3 with an appropriate affect.    Lab Results:  Recent Labs    06/15/24 0249 06/15/24 1939 06/16/24 0446  WBC 11.8*  --  11.0*  HGB 8.2* 9.3* 8.2*  HCT 25.2* 28.4* 25.5*  PLT 117*  --  135*   BMET Recent Labs    06/15/24 0249 06/16/24 0446  NA 137 140  K 4.5 4.0  CL 106 108  CO2 25 24  GLUCOSE 120* 125*  BUN 22 19  CREATININE 1.14* 1.10*  CALCIUM  8.0* 8.0*   PT/INR No results for input(s): LABPROT, INR in the last 72 hours. CMP     Component Value Date/Time   NA 140 06/16/2024 0446   NA 143 06/01/2024 0958   K 4.0 06/16/2024 0446   CL 108 06/16/2024 0446   CO2 24 06/16/2024 0446   GLUCOSE 125 (H) 06/16/2024 0446   GLUCOSE 96 05/27/2006 1052   BUN 19 06/16/2024 0446   BUN 16  06/01/2024 0958   CREATININE 1.10 (H) 06/16/2024 0446   CREATININE 0.90 01/01/2014 1046   CALCIUM  8.0 (L) 06/16/2024 0446   PROT 4.7 (L) 06/15/2024 0249   PROT 4.8 (L) 06/01/2024 0958   ALBUMIN  2.2 (L) 06/15/2024 0249   ALBUMIN  2.5 (L) 06/01/2024 0958   AST 18 06/15/2024 0249   ALT 14 06/15/2024 0249   ALKPHOS 68 06/15/2024 0249   BILITOT 0.2 06/15/2024 0249   BILITOT 0.2 06/01/2024 0958   GFRNONAA 53 (L) 06/16/2024 0446   GFRAA 39 (L) 09/24/2018 1054   Lipase     Component Value Date/Time   LIPASE <10 (L) 06/07/2024 1350       Studies/Results: No results found.  Anti-infectives: Anti-infectives (From admission, onward)    Start     Dose/Rate Route Frequency Ordered Stop   06/12/24 1000  cefoTEtan  (CEFOTAN ) 2 g in sodium chloride  0.9 % 100 mL IVPB        2 g 200 mL/hr over 30 Minutes Intravenous On call to O.R. 06/12/24 0948 06/12/24 1315        Assessment/Plan POD4: S/P LAPAROSCOPIC ASSISTED  RIGHT COLECTOMY WITH EXTENDED RESECTION OF DISTAL ILEUM, Laparoscopic lysis of adhesions for 20-minute by Dr. Tanda 11/21 -Afebrile.  -WBC 11.0. -HGB 8.2 from 9.3. Continue to monitor.  -Surgical pathology pending.  -Had BM this morning  with flatus. No n/v. Pain manageable. -Tolerated CLD. Will advance to FLD. -Mobilize as tolerated, pulm toilet -Cr 1.10 from 1.14. Stable. -Continue TPN; Will discuss reducing.  -Will continue to follow.   FEN: FLD/TPN; IVF per primary team VTE: Hold DVT ppx ID: None currently.   LOS: 9 days   I reviewed nursing notes, specialist notes, hospitalist notes, last 24 h vitals and pain scores, last 48 h intake and output, last 24 h labs and trends, and last 24 h imaging results.  This care required moderate level of medical decision making.    [name], [facility] 06/16/2024, 11:14 AM Please see Amion for pager number during day hours 7:00am-4:30pm

## 2024-06-16 NOTE — Discharge Instructions (Signed)
 [facility]      [facility], [place] [phone]  OPEN ABDOMINAL SURGERY: POST OP INSTRUCTIONS  Always review your discharge instruction sheet given to you by the facility where your surgery was performed.  IF YOU HAVE DISABILITY OR FAMILY LEAVE FORMS, YOU MUST BRING THEM TO THE OFFICE FOR PROCESSING.  PLEASE DO NOT GIVE THEM TO YOUR DOCTOR.  A prescription for pain medication may be given to you upon discharge.  Take your pain medication as prescribed, if needed.  If narcotic pain medicine is not needed, then you may take acetaminophen  (Tylenol ) or ibuprofen (Advil) as needed. Take your usually prescribed medications unless otherwise directed. If you need a refill on your pain medication, please contact your pharmacy. They will contact our office to request authorization.  Prescriptions will not be filled after 5pm or on week-ends. You should follow a light diet the first few days after arrival home, such as soup and crackers, pudding, etc.unless your doctor has advised otherwise. A high-fiber, low fat diet can be resumed as tolerated.   Be sure to include lots of fluids daily. Most patients will experience some swelling and bruising on the chest and neck area.  Ice packs will help.  Swelling and bruising can take several days to resolve Most patients will experience some swelling and bruising in the area of the incision. Ice pack will help. Swelling and bruising can take several days to resolve..  It is common to experience some constipation if taking pain medication after surgery.  Increasing fluid intake and taking a stool softener will usually help or prevent this problem from occurring.  A mild laxative (Milk of Magnesia or Miralax) should be taken according to package directions if there are no bowel movements after 48 hours.  You may have steri-strips (small skin tapes) in place directly over the incision.  These strips should be left on the skin for 7-10 days.  If your surgeon used skin  glue on the incision, you may shower in 24 hours.  The glue will flake off over the next 2-3 weeks.  Any sutures or staples will be removed at the office during your follow-up visit. You may find that a light gauze bandage over your incision may keep your staples from being rubbed or pulled. You may shower and replace the bandage daily. ACTIVITIES:  You may resume regular (light) daily activities beginning the next day--such as daily self-care, walking, climbing stairs--gradually increasing activities as tolerated.  You may have sexual intercourse when it is comfortable.  Refrain from any heavy lifting or straining until approved by your doctor. You may drive when you no longer are taking prescription pain medication, you can comfortably wear a seatbelt, and you can safely maneuver your car and apply brakes Return to Work: ___________________________________ Brooke [name] should see your doctor in the office for a follow-up appointment approximately two weeks after your surgery.  Make sure that you call for this appointment within a day or two after you arrive home to insure a convenient appointment time. OTHER INSTRUCTIONS:  _____________________________________________________________ _____________________________________________________________  WHEN TO CALL YOUR DOCTOR: Fever over 101.0 Inability to urinate Nausea and/or vomiting Extreme swelling or bruising Continued bleeding from incision. Increased pain, redness, or drainage from the incision. Difficulty swallowing or breathing Muscle cramping or spasms. Numbness or tingling in hands or feet or around lips.  The clinic staff is available to answer your questions during regular business hours.  Please don't hesitate to call and ask to speak to one of  the nurses if you have concerns.  For further questions, please visit [email or web address]

## 2024-06-16 NOTE — Plan of Care (Signed)
   Problem: Education: Goal: Knowledge of General Education information will improve Description Including pain rating scale, medication(s)/side effects and non-pharmacologic comfort measures Outcome: Progressing   Problem: Health Behavior/Discharge Planning: Goal: Ability to manage health-related needs will improve Outcome: Progressing

## 2024-06-16 NOTE — TOC Progression Note (Signed)
 Transition of Care [facility]) - Progression Note    Patient Details  Name: Brooke [name] MRN: [id number] Date of Birth: 08/16/1950  Transition of Care [facility]) CM/SW Contact  [name] [name], LCSW Phone Number: 06/16/2024, 10:41 AM  Clinical Narrative:     Pt active with [facility] for home health services. Care management to follow for d/c needs.    Barriers to Discharge: Continued Medical Work up               Expected Discharge Plan and Services                                               Social Drivers of Health (SDOH) Interventions SDOH Screenings   Food Insecurity: No Food Insecurity (05/18/2024)  Housing: Unknown (05/18/2024)  Transportation Needs: No Transportation Needs (05/18/2024)  Utilities: Not At Risk (05/18/2024)  Alcohol Screen: Low Risk  (09/24/2023)  Depression (PHQ2-9): Low Risk  (06/02/2024)  Financial Resource Strain: Low Risk  (09/24/2023)  Physical Activity: Insufficiently Active (09/24/2023)  Social Connections: Moderately Isolated (05/13/2024)  Stress: No Stress Concern Present (09/24/2023)  Tobacco Use: Low Risk  (06/12/2024)  Health Literacy: Adequate Health Literacy (09/24/2023)    Readmission Risk Interventions    05/14/2024   12:41 PM 05/05/2024   11:28 AM 04/16/2024    9:45 AM  Readmission Risk Prevention Plan  Transportation Screening Complete Complete Complete  PCP or Specialist Appt within 3-5 Days   Complete  HRI or Home Care Consult  Complete Complete  Social Work Consult for Recovery Care Planning/Counseling  Complete Complete  Palliative Care Screening  Not Applicable Not Applicable  Medication Review Oceanographer) Complete Complete Complete  HRI or Home Care Consult Complete    SW Recovery Care/Counseling Consult Complete    Palliative Care Screening Not Applicable    Skilled Nursing Facility Not Applicable

## 2024-06-16 NOTE — Plan of Care (Incomplete)
  Problem: Education: Goal: Knowledge of General Education information will improve Description: Including pain rating scale, medication(s)/side effects and non-pharmacologic comfort measures Outcome: Progressing   Problem: Health Behavior/Discharge Planning: Goal: Ability to manage health-related needs will improve Outcome: Progressing   Problem: Clinical Measurements: Goal: Ability to maintain clinical measurements within normal limits will improve Outcome: Progressing Goal: Diagnostic test results will improve Outcome: Progressing   Problem: Activity: Goal: Risk for activity intolerance will decrease Outcome: Progressing   Problem: Nutrition: Goal: Adequate nutrition will be maintained Outcome: Progressing   Problem: Clinical Measurements: Goal: Will remain free from infection Outcome: Adequate for Discharge Goal: Respiratory complications will improve Outcome: Adequate for Discharge Goal: Cardiovascular complication will be avoided Outcome: Adequate for Discharge   Problem: Coping: Goal: Level of anxiety will decrease Outcome: Adequate for Discharge   Problem: Elimination: Goal: Will not experience complications related to bowel motility Outcome: Adequate for Discharge Goal: Will not experience complications related to urinary retention Outcome: Adequate for Discharge

## 2024-06-16 NOTE — Progress Notes (Signed)
 PHYSICAL THERAPY  Pt is familiar from prior, recent admit.  Having a lot pain today, Pt asked if she could walk tomorrow.  I walked yesterday, stated Pt.  Pt getting OOB to [facility] with Nursing. Will attempt to see tomorrow.  Pt has been evaluated with rec for [facility] PT.  Pt is a retired LAWYER and very motivated.  Katheryn Leap  PTA Acute  Rehabilitation Services Office M-F          [phone]

## 2024-06-16 NOTE — Progress Notes (Signed)
 PHARMACY - TOTAL PARENTERAL NUTRITION CONSULT NOTE   Indication: Planned SBR  Patient Measurements: Height: 5' 3 (160 cm) Weight: 88 kg (194 lb 1.6 oz) IBW/kg (Calculated) : 52.4 TPN AdjBW (KG): 60.9 Body mass index is 34.38 kg/m.  Assessment: Pt presented with abdominal pain, N/V, unable to tolerate PO intake. Pt s/p colonoscopy with concern for malignancy (pathology pending) as well as colectomy. Pharmacy consulted to manage TPN.  Glucose / Insulin : hx T2DM, not on medications PTA. A1c 5.4 -BG goal <180. Range: 125 - 152 (2 units SSI/24 hrs) Electrolytes: All WNL, including CorrCa (9.4) -Mg on lower end of normal Renal: CKD. SCr lower than baseline, BUN WNL/stable Hepatic: LFTs, Alk Phos, T.bili WNL. Albumin  low Intake / Output; MIVF: no mIVF. Strict I/O not charted. -UOP: x5 unmeasured. Stool x3 GI Imaging: - 11/16 CT:  Enlarged appendix concerning for mild acute appendicitis. Concern for mass/neoplasm in ascending colon. 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 Mild diverticulosis with no evidence of diverticular bleeding.  - 11/21 laparoscopic right colectomy with extended resection of distal ileum  Central access: PICC 11/18 TPN start date: 11/18  Nutritional Goals: Goal TPN rate is 63mL/hr (provides 84 g of protein and 1700 kcals per day)  RD Assessment: TBD Estimated Needs Total Energy Estimated Needs: 1550-1750 Total Protein Estimated Needs: 75-85g Total Fluid Estimated Needs: 1.7L/day  Current Nutrition:  TPN Clear liquid diet  Plan:  Now: Mg 2 g IV once  At 1800: Continue TPN at goal rate of 70 mL/hr Electrolytes in TPN: No change Na 50 mEq/L, K 40 mEq/L, Ca 5 mEq/L, Mg 4 mEq/L, Phos 20 mmol/L Cl:Ac 1:1  Add standard MVI and trace elements to TPN continue Sensitive q6h SSI and adjust as needed Monitor TPN labs on Mon/Thurs.  Ronal CHRISTELLA Rav,  PharmD 06/16/24 7:49 AM

## 2024-06-17 DIAGNOSIS — I10 Essential (primary) hypertension: Secondary | ICD-10-CM

## 2024-06-17 DIAGNOSIS — N1832 Chronic kidney disease, stage 3b: Secondary | ICD-10-CM

## 2024-06-17 DIAGNOSIS — F418 Other specified anxiety disorders: Secondary | ICD-10-CM | POA: Diagnosis not present

## 2024-06-17 DIAGNOSIS — K56699 Other intestinal obstruction unspecified as to partial versus complete obstruction: Secondary | ICD-10-CM | POA: Diagnosis not present

## 2024-06-17 DIAGNOSIS — K6389 Other specified diseases of intestine: Secondary | ICD-10-CM | POA: Diagnosis not present

## 2024-06-17 DIAGNOSIS — K219 Gastro-esophageal reflux disease without esophagitis: Secondary | ICD-10-CM

## 2024-06-17 DIAGNOSIS — E119 Type 2 diabetes mellitus without complications: Secondary | ICD-10-CM | POA: Diagnosis not present

## 2024-06-17 LAB — GLUCOSE, CAPILLARY
Glucose-Capillary: 123 mg/dL — ABNORMAL HIGH (ref 70–99)
Glucose-Capillary: 143 mg/dL — ABNORMAL HIGH (ref 70–99)
Glucose-Capillary: 146 mg/dL — ABNORMAL HIGH (ref 70–99)
Glucose-Capillary: 153 mg/dL — ABNORMAL HIGH (ref 70–99)

## 2024-06-17 LAB — BASIC METABOLIC PANEL WITH GFR
Anion gap: 6 (ref 5–15)
BUN: 18 mg/dL (ref 8–23)
CO2: 24 mmol/L (ref 22–32)
Calcium: 7.9 mg/dL — ABNORMAL LOW (ref 8.9–10.3)
Chloride: 111 mmol/L (ref 98–111)
Creatinine, Ser: 1.06 mg/dL — ABNORMAL HIGH (ref 0.44–1.00)
GFR, Estimated: 56 mL/min — ABNORMAL LOW (ref >60)
Glucose, Bld: 130 mg/dL — ABNORMAL HIGH (ref 70–99)
Potassium: 3.8 mmol/L (ref 3.5–5.1)
Sodium: 141 mmol/L (ref 135–145)

## 2024-06-17 LAB — CBC
HCT: 24.3 % — ABNORMAL LOW (ref 36.0–46.0)
Hemoglobin: 7.7 g/dL — ABNORMAL LOW (ref 12.0–15.0)
MCH: 28.1 pg (ref 26.0–34.0)
MCHC: 31.7 g/dL (ref 30.0–36.0)
MCV: 88.7 fL (ref 80.0–100.0)
Platelets: 142 K/uL — ABNORMAL LOW (ref 150–400)
RBC: 2.74 MIL/uL — ABNORMAL LOW (ref 3.87–5.11)
RDW: 16.7 % — ABNORMAL HIGH (ref 11.5–15.5)
WBC: 11.6 K/uL — ABNORMAL HIGH (ref 4.0–10.5)
nRBC: 0.3 % — ABNORMAL HIGH (ref 0.0–0.2)

## 2024-06-17 LAB — SURGICAL PATHOLOGY

## 2024-06-17 MED ORDER — TRAVASOL 10 % IV SOLN
INTRAVENOUS | Status: DC
  Filled 2024-06-17: qty 480

## 2024-06-17 MED ORDER — BOOST / RESOURCE BREEZE PO LIQD CUSTOM
1.0000 | Freq: Three times a day (TID) | ORAL | Status: DC
  Administered 2024-06-17 – 2024-06-21 (×10): 1 via ORAL

## 2024-06-17 MED ORDER — CHLORHEXIDINE GLUCONATE CLOTH 2 % EX PADS
6.0000 | MEDICATED_PAD | Freq: Every day | CUTANEOUS | Status: DC
  Administered 2024-06-17 – 2024-06-20 (×4): 6 via TOPICAL

## 2024-06-17 MED ORDER — SODIUM CHLORIDE 0.9% FLUSH: 10.0000 mL | INTRAVENOUS | Status: DC | PRN

## 2024-06-17 MED ORDER — ORAL CARE MOUTH RINSE: 15.0000 mL | OROMUCOSAL | Status: DC | PRN

## 2024-06-17 MED ORDER — SODIUM CHLORIDE 0.9% FLUSH
10.0000 mL | Freq: Two times a day (BID) | INTRAVENOUS | Status: DC
  Administered 2024-06-17 – 2024-06-19 (×2): 10 mL

## 2024-06-17 MED ORDER — HYDRALAZINE HCL 25 MG PO TABS: 25.0000 mg | ORAL_TABLET | Freq: Three times a day (TID) | ORAL | Status: DC | PRN

## 2024-06-17 MED ORDER — PHENYLEPHRINE HCL-NACL 20-0.9 MG/250ML-% IV SOLN
INTRAVENOUS | Status: AC
Start: 2024-06-17 — End: 2024-06-17
  Filled 2024-06-17: qty 750

## 2024-06-17 NOTE — Progress Notes (Signed)
 Progress Note  5 Days Post-Op  Subjective: Patient reports pain is manageable. Tolerating FLD. Had BM yesterday but none this AM. Having flatulence. Denies n/v.   ROS  All negative with the exception of above.  Objective: Vital signs in last 24 hours: Temp:  [98.2 F (36.8 C)-101.2 F (38.4 C)] 98.2 F (36.8 C) (11/26 0520) Pulse Rate:  [85-92] 92 (11/26 1010) Resp:  [18-20] 18 (11/26 0520) BP: (157-170)/(68-73) 170/73 (11/26 1010) SpO2:  [95 %-100 %] 100 % (11/26 0857) Weight:  [88.1 kg] 88.1 kg (11/26 0500) Last BM Date : 06/16/24  Intake/Output from previous day: 11/25 0701 - 11/26 0700 In: 2244.2 [P.O.:120; I.V.:2124.2] Out: -  Intake/Output this shift: Total I/O In: -  Out: 200 [Urine:200]  PE: General: Pleasant female who is sitting in chair at bedside in NAD. HEENT: Head is normocephalic, atraumatic.  Heart: Normal HR during encounter. Lungs: Respiratory effort nonlabored. Abd: Soft, NT, mild distention. Midline incision present with staples covered with honeycomb dressing. No concern for infection or active bleeding. No rebound tenderness or guarding.  MS: Able to move all extremities.  Skin: Warm and dry.  Psych: A&Ox3 with an appropriate affect.    Lab Results:  Recent Labs    06/16/24 0446 06/17/24 0153  WBC 11.0* 11.6*  HGB 8.2* 7.7*  HCT 25.5* 24.3*  PLT 135* 142*   BMET Recent Labs    06/16/24 0446 06/17/24 0153  NA 140 141  K 4.0 3.8  CL 108 111  CO2 24 24  GLUCOSE 125* 130*  BUN 19 18  CREATININE 1.10* 1.06*  CALCIUM  8.0* 7.9*   PT/INR No results for input(s): LABPROT, INR in the last 72 hours. CMP     Component Value Date/Time   NA 141 06/17/2024 0153   NA 143 06/01/2024 0958   K 3.8 06/17/2024 0153   CL 111 06/17/2024 0153   CO2 24 06/17/2024 0153   GLUCOSE 130 (H) 06/17/2024 0153   GLUCOSE 96 05/27/2006 1052   BUN 18 06/17/2024 0153   BUN 16 06/01/2024 0958   CREATININE 1.06 (H) 06/17/2024 0153   CREATININE  0.90 01/01/2014 1046   CALCIUM  7.9 (L) 06/17/2024 0153   PROT 4.7 (L) 06/15/2024 0249   PROT 4.8 (L) 06/01/2024 0958   ALBUMIN  2.2 (L) 06/15/2024 0249   ALBUMIN  2.5 (L) 06/01/2024 0958   AST 18 06/15/2024 0249   ALT 14 06/15/2024 0249   ALKPHOS 68 06/15/2024 0249   BILITOT 0.2 06/15/2024 0249   BILITOT 0.2 06/01/2024 0958   GFRNONAA 56 (L) 06/17/2024 0153   GFRAA 39 (L) 09/24/2018 1054   Lipase     Component Value Date/Time   LIPASE <10 (L) 06/07/2024 1350       Studies/Results: No results found.  Anti-infectives: Anti-infectives (From admission, onward)    Start     Dose/Rate Route Frequency Ordered Stop   06/12/24 1000  cefoTEtan  (CEFOTAN ) 2 g in sodium chloride  0.9 % 100 mL IVPB        2 g 200 mL/hr over 30 Minutes Intravenous On call to O.R. 06/12/24 0948 06/12/24 1315        Assessment/Plan POD5: S/P LAPAROSCOPIC ASSISTED  RIGHT COLECTOMY WITH EXTENDED RESECTION OF DISTAL ILEUM, Laparoscopic lysis of adhesions for 20-minute by Dr. Tanda 11/21 -Afebrile.  -WBC 11.6 from 11.0 -HGB 7.7 from 8.2 -Had BM yesterday with flatulence. No n/v. Pain manageable. -Tolerated FLD. Will advance to soft. Will reduce TPN to 1/2. -Mobilize as tolerated, pulm toilet -  Cr 1.06 from 1.10. Stable. -Surgical pathology as below. Discussed findings with patient and recommending oncology consult. I have also communicated this to the primary team.  FINAL MICROSCOPIC DIAGNOSIS:  A. COLON, RIGHT, DISTAL ILEUM, RIGHT HEMICOLECTOMY:       Invasive adenocarcinoma, moderately differentiated.       Tumor site: Ascending colon.       Tumor size: 4.0 cm in maximal dimension.       Tumor invades pericolorectal soft tissue.       Lymphovascular invasion is identified.       Perineural invasion is not identified.       Surgical margins of resection are negative for carcinoma.       Twelve lymph nodes, negative for metastatic carcinoma (0/12).       See oncology table.   -Will continue to  follow up. Will have outpatient general surgery appointments arranged.    FEN: Soft /TPN; IVF per primary team VTE: Hold DVT ppx ID: None currently.    LOS: 10 days   I reviewed specialist notes, nursing notes, hospitalist notes, last 24 h vitals and pain scores, last 48 h intake and output, last 24 h labs and trends, and last 24 h imaging results.   [name], [facility] 06/17/2024, 10:15 AM Please see Amion for pager number during day hours 7:00am-4:30pm

## 2024-06-17 NOTE — Progress Notes (Signed)
 [facility]                                                                               Brooke [name], is a 73 y.o. female, DOB - 08-20-1950, FMW:[id number] Admit date - 06/07/2024    Outpatient Primary MD for the patient is Norleen Lynwood ORN, MD  LOS - 10  days    Brief summary   Brooke [name] is a 73 y.o. female with past medical history significant for HTN, HLD, DM2, asthma, anxiety, hypothyroidism, CKD stage IIIb, GERD who presented to [facility] ED on 06/07/2024 from home with complaint of abdominal pain associated with nausea and vomiting.  Poor appetite.  Pain is localized to the epigastric region and rated as a 6/10, sharp and constant.  No radiation.  Food exacerbates symptoms.  Denies diarrhea, no fever/chills.  Denies any blood in her stool.  Further denies chest pain, no palpitations, no shortness of breath, no cough, no urinary symptoms.   Last colonoscopy 12/2019 with diverticulosis of the sigmoid colon, otherwise normal.  No family history of colon cancer.   In the ED, temperature 98.1 F, HR 78, RR 18, BP 152/82, SpO2 99% on room air.  WBC 8.5, hemoglobin 10.3, platelet count 322.  Sodium 141, potassium 4.1, chloride 109, CO2 22, glucose 108, BUN 13, creat 1.61.  Alkaline phosphatase 180.  Lipase less than 10.  AST 27, ALT 12, total bilirubin 0.3.  Procalcitonin 0.33.  Lactic acid 2.0.  Vitamin B12 1004 and 76.  Iron 39, TIBC 207, ferritin 142.  Urinalysis with negative leukocytes negative nitrite, rare bacteria, 0-5 WBCs.  CT abdomen/pelvis with contrast with findings of enlarged appendix measuring 1.3 cm in diameter with enhancement and mild adjacent free fluid in the right lower quadrant concerning for mild acute appendicitis, focal circumferential narrowing with slight associated increased soft tissue density/enhancement over the ascending colon in the region of the hepatic flexure concerning for a focal mass/neoplasm, wall thickening of a moderate segment of distal ileum  may be due to small bowel enteritis versus infectious versus inflammatory in nature, mild free peritoneal fluid, several liver cyst unchanged, 2.8 cm hyperdense mass over the upper pole left kidney unchanged compatible with slightly hyperdense cyst, aortic atherosclerosis.  General surgery was consulted.  TRH consulted for admission for further evaluation and management.   Assessment & Plan   Nausea vomiting abdominal pain on admission CT of the abdomen and pelvis showing enlarged appendix of 1.3 cm in diameter, focal circumferential narrowing with slightly associated increased soft tissue density/enhancement over the ascending colon in the region of hepatic flexure concerning for focal mass/neoplasm Colonoscopy on 11/20 showed severe ulcerated stricture at the hepatic flexure concerning for malignancy. Ascending colon mass secondary to invasive adenocarcinoma, moderately differentiated Status post laparoscopic-assisted right colectomy with extended resection of distal ileum and lysis of additions by Dr. Tanda on 11/21. Oncology consulted Patient started on diet able to tolerate liquid diet without any issues advance to soft diet today Pain control with IV morphine  and antiemetics    Hypokalemia, hypophosphatemia and hypomagnesemia Replaced on patient is on TPN    Acute postop ANEMIA Status status post 3 units of PRBC  transfusion Hemoglobin continues to trickle down posttransfusion currently at 7.7. Transfuse to keep it greater than 7.    Mild leukocytosis Continue to monitor for signs of infection   Hypertension Blood pressure parameters slightly elevated today Added hydralazine  as needed to Cardizem  and metoprolol    Hyperlipidemia Patient on Crestor  continue the same.   Type 2 diabetes mellitus, diet controlled Hemoglobin A1c at 5.4 Continue sliding scale insulin     Stage IIIb CKD Creatinine appears to be at baseline today   History of depression and  anxiety Resume Celexa    Body mass index is 34.41 kg/m. Obesity class I   GERD Stable   RN Pressure Injury Documentation:    Malnutrition Type:  Nutrition Problem: Inadequate oral intake Etiology: nausea, vomiting   Malnutrition Characteristics:  Signs/Symptoms: per patient/family report   Nutrition Interventions:  Interventions: TPN  Estimated body mass index is 34.41 kg/m as calculated from the following:   Height as of this encounter: 5' 3 (1.6 m).   Weight as of this encounter: 88.1 kg.  Code Status: Full code DVT Prophylaxis:  SCDs Start: 06/07/24 2000 Place TED hose Start: 06/07/24 2000   Level of Care: Level of care: Progressive Family Communication: None at bedside  Disposition Plan:     Remains inpatient appropriate: Pending clinical improvement,  Procedures:  Status post laparoscopic-assisted right colectomy with extended resection of distal ileum Laparoscopic lysis of adhesions by Dr. Tanda on 11/21  Consultants:   General Surgery Oncology  Antimicrobials:   Anti-infectives (From admission, onward)    Start     Dose/Rate Route Frequency Ordered Stop   06/12/24 1000  cefoTEtan  (CEFOTAN ) 2 g in sodium chloride  0.9 % 100 mL IVPB        2 g 200 mL/hr over 30 Minutes Intravenous On call to O.R. 06/12/24 0948 06/12/24 1315        Medications  Scheduled Meds:  allopurinol   100 mg Oral Daily   atropine   1 drop Left Eye Daily   azelastine   2 spray Each Nare BID   brimonidine   1 drop Left Eye BID   budesonide -glycopyrrolate -formoterol   2 puff Inhalation BID   Chlorhexidine  Gluconate Cloth  6 each Topical Daily   citalopram   40 mg Oral Daily   diltiazem   180 mg Oral Daily   erythromycin    Left Eye QHS   feeding supplement  1 Container Oral TID BM   insulin  aspart  0-9 Units Subcutaneous Q6H   latanoprost   1 drop Left Eye QHS   loratadine   10 mg Oral Daily   metoprolol  succinate  100 mg Oral Daily   pantoprazole  (PROTONIX ) IV  40 mg  Intravenous Q24H   phenylephrine        rosuvastatin   40 mg Oral Daily   sodium bicarbonate   1,300 mg Oral Daily   sodium chloride  flush  10-40 mL Intracatheter Q12H   Continuous Infusions:  TPN ADULT (ION) 70 mL/hr at 06/16/24 1756   TPN ADULT (ION)     PRN Meds:.acetaminophen , methocarbamol  (ROBAXIN ) injection, metoprolol  tartrate, morphine  injection, ondansetron  **OR** ondansetron  (ZOFRAN ) IV, mouth rinse, phenylephrine , sodium chloride  flush, sodium chloride  flush, traZODone     Subjective:   Brooke [name] was seen and examined today.  No new complaints today.     Objective:   Vitals:   06/17/24 0520 06/17/24 0857 06/17/24 1010 06/17/24 1304  BP: (!) 166/70  (!) 170/73 (!) 167/78  Pulse: 85  92 93  Resp: 18   18  Temp: 98.2 F (36.8 C)  98.7 F (37.1 C)  TempSrc: Oral   Oral  SpO2: 100% 100%  100%  Weight:      Height:        Intake/Output Summary (Last 24 hours) at 06/17/2024 1657 Last data filed at 06/17/2024 1509 Gross per 24 hour  Intake 3130.92 ml  Output 300 ml  Net 2830.92 ml   Filed Weights   06/15/24 0500 06/16/24 0852 06/17/24 0500  Weight: 88 kg 87.4 kg 88.1 kg     Exam General exam: Appears calm and comfortable  Respiratory system: Clear to auscultation. Respiratory effort normal. Cardiovascular system: S1 & S2 heard, RRR.  Gastrointestinal system: Abdomen is nondistended, soft and nontender. Incisions c/d/i Central nervous system: Alert and oriented. Extremities: no edema.  Skin: No rashes,  Psychiatry: Mood & affect appropriate.    Data Reviewed:  I have personally reviewed following labs and imaging studies   CBC Lab Results  Component Value Date   WBC 11.6 (H) 06/17/2024   RBC 2.74 (L) 06/17/2024   HGB 7.7 (L) 06/17/2024   HCT 24.3 (L) 06/17/2024   MCV 88.7 06/17/2024   MCH 28.1 06/17/2024   PLT 142 (L) 06/17/2024   MCHC 31.7 06/17/2024   RDW 16.7 (H) 06/17/2024   LYMPHSABS 1.1 06/07/2024   MONOABS 0.3 06/07/2024   EOSABS  0.0 06/07/2024   BASOSABS 0.0 06/07/2024     Last metabolic panel Lab Results  Component Value Date   NA 141 06/17/2024   K 3.8 06/17/2024   CL 111 06/17/2024   CO2 24 06/17/2024   BUN 18 06/17/2024   CREATININE 1.06 (H) 06/17/2024   GLUCOSE 130 (H) 06/17/2024   GFRNONAA 56 (L) 06/17/2024   GFRAA 39 (L) 09/24/2018   CALCIUM  7.9 (L) 06/17/2024   PHOS 2.8 06/16/2024   PROT 4.7 (L) 06/15/2024   ALBUMIN  2.2 (L) 06/15/2024   LABGLOB 2.3 06/01/2024   AGRATIO 1.7 10/27/2021   BILITOT 0.2 06/15/2024   ALKPHOS 68 06/15/2024   AST 18 06/15/2024   ALT 14 06/15/2024   ANIONGAP 6 06/17/2024    CBG (last 3)  Recent Labs    06/16/24 2319 06/17/24 0659 06/17/24 1125  GLUCAP 143* 143* 153*      Coagulation Profile: No results for input(s): INR, PROTIME in the last 168 hours.   Radiology Studies: No results found.     Elgie Butter M.D. [facility] 06/17/2024, 4:57 PM  Available via Epic secure chat 7am-7pm After 7 pm, please refer to night coverage provider listed on amion.

## 2024-06-17 NOTE — Progress Notes (Signed)
 PHARMACY - TOTAL PARENTERAL NUTRITION CONSULT NOTE   Indication: Planned SBR  Patient Measurements: Height: 5' 3 (160 cm) Weight: 88.1 kg (194 lb 3.6 oz) IBW/kg (Calculated) : 52.4 TPN AdjBW (KG): 60.9 Body mass index is 34.41 kg/m.  Assessment: Pt presented with abdominal pain, N/V, unable to tolerate PO intake. Pt s/p colonoscopy with concern for malignancy (pathology pending) as well as colectomy. Pharmacy consulted to manage TPN.  Glucose / Insulin : hx T2DM, not on medications PTA. A1c 5.4 -BG goal <180. Range: 130 - 154 (6 units SSI/24 hrs) Electrolytes: All WNL, including CorrCa (9.3) Renal: CKD. SCr lower than baseline, BUN WNL/stable Hepatic: LFTs, Alk Phos, T.bili WNL. Albumin  low Intake / Output; MIVF: no mIVF. Strict I/O not charted. -UOP: x7 unmeasured. Stool x3 GI Imaging: - 11/16 CT:  Enlarged appendix concerning for mild acute appendicitis. Concern for mass/neoplasm in ascending colon. 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 Mild diverticulosis with no evidence of diverticular bleeding.  - 11/21 laparoscopic right colectomy with extended resection of distal ileum  Central access: PICC 11/18 TPN start date: 11/18  Nutritional Goals: Goal TPN rate is 70mL/hr (provides 84 g of protein and 1700 kcals per day)  RD Assessment: TBD Estimated Needs Total Energy Estimated Needs: 1550-1750 Total Protein Estimated Needs: 75-85g Total Fluid Estimated Needs: 1.7L/day  Current Nutrition:  TPN Full liquid diet  Plan:  Per discussion with CCM, will reduce TPN to half of goal rate today.   At 1800: Reduce TPN to 40 mL/hr Will provide 48g protein, 972 kcal Electrolytes in TPN: No change Na 50 mEq/L, K 40 mEq/L, Ca 5 mEq/L, Mg 4 mEq/L, Phos 20 mmol/L Cl:Ac 1:1  Add standard MVI and trace elements to TPN continue Sensitive q6h SSI and adjust as needed Monitor TPN labs on  Mon/Thurs.  Ronal CHRISTELLA Rav, PharmD 06/17/24 7:47 AM

## 2024-06-17 NOTE — Progress Notes (Signed)
 Physical Therapy Treatment Patient Details Name: Brooke [name] MRN: [id number] DOB: 1951-02-28 Today's Date: 06/17/2024   History of Present Illness Brooke [name] is a 73 y.o. female who presented to [facility] ED on 06/07/2024 from home with complaint of abdominal pain associated with nausea and vomiting; s/p  LAPAROSCOPIC ASSISTED  RIGHT COLECTOMY WITH EXTENDED RESECTION OF DISTAL ILEUM 11/21. PMH: HTN, HLD, DM2, asthma, anxiety, hypothyroidism, CKD stage IIIb, GERD    PT Comments   AxO x 3 pleasant and willing.  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 POD 5 LAP Progressing slowly.  Assisted OOB to [facility] then assisted in hallway to amb.  General Gait Details: tolerated a functional distance 52 feet using a walker and Daughter in [place] following with recliner for safety.  Pt plans to return home with Family support.  LPT has rec HH PT.   If plan is discharge home, recommend the following: A little help with bathing/dressing/bathroom;Help with stairs or ramp for entrance;Assist for transportation;Assistance with cooking/housework;A little help with walking and/or transfers   Can travel by private vehicle        Equipment Recommendations  None recommended by PT    Recommendations for Other Services       Precautions / Restrictions Precautions Precautions: Fall Precaution/Restrictions Comments: recent ABD surgery Restrictions Weight Bearing Restrictions Per Provider Order: No     Mobility  Bed Mobility Overal bed mobility: Modified Independent Bed Mobility: Supine to Sit, Sit to Supine     Supine to sit: Supervision, Contact guard Sit to supine: Contact guard assist, Min assist   General bed mobility comments: required increased time and extra assist back to bed to support B LE    Transfers Overall transfer level: Needs assistance Equipment used: Rolling walker (2 wheels), None Transfers: Sit to/from Stand Sit to Stand: Contact guard assist, Supervision Stand pivot  transfers: Contact guard assist, Min assist         General transfer comment: assisted from EOB to [facility] then from [facility] back to EOB with increased time    Ambulation/Gait Ambulation/Gait assistance: Supervision, Contact guard assist Gait Distance (Feet): 52 Feet Assistive device: Rolling walker (2 wheels) Gait Pattern/deviations: Step-through pattern, Decreased step length - left, Decreased step length - right, Decreased stride length       General Gait Details: tolerated a functional distance 52 feet using a walker and Daughter in [place] following with recliner for safety.   Stairs             Wheelchair Mobility     Tilt Bed    Modified Rankin (Stroke Patients Only)       Balance                                            Communication Communication Communication: No apparent difficulties  Cognition Arousal: Alert Behavior During Therapy: WFL for tasks assessed/performed                           PT - Cognition Comments: AxO x 3 pleasant and willing.  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 Following commands: Intact      Cueing Cueing Techniques: Verbal cues  Exercises      General Comments        Pertinent Vitals/Pain Pain Assessment Pain Assessment: Faces Faces Pain Scale: Hurts  little more Pain Location: abdomen Pain Descriptors / Indicators: Discomfort, Grimacing Pain Intervention(s): Monitored during session, Premedicated before session, Repositioned    Home Living                          Prior Function            PT Goals (current goals can now be found in the care plan section) Progress towards PT goals: Progressing toward goals    Frequency    Min 2X/week      PT Plan      Co-evaluation              AM-PAC PT 6 Clicks Mobility   Outcome Measure  Help needed turning from your back to your side while in a flat bed without using bedrails?: A Lot Help needed moving  from lying on your back to sitting on the side of a flat bed without using bedrails?: A Lot Help needed moving to and from a bed to a chair (including a wheelchair)?: A Little Help needed standing up from a chair using your arms (e.g., wheelchair or bedside chair)?: A Little Help needed to walk in hospital room?: A Little Help needed climbing 3-5 steps with a railing? : A Lot 6 Click Score: 15    End of Session Equipment Utilized During Treatment: Gait belt Activity Tolerance: Patient tolerated treatment well Patient left: in bed;with call bell/phone within reach;with family/visitor present Nurse Communication: Mobility status PT Visit Diagnosis: Muscle weakness (generalized) (M62.81);Difficulty in walking, not elsewhere classified (R26.2);Pain     Time: 8970-8943 PT Time Calculation (min) (ACUTE ONLY): 27 min  Charges:    $Gait Training: 8-22 mins $Therapeutic Activity: 8-22 mins PT General Charges $$ ACUTE PT VISIT: 1 Visit                     Katheryn Leap  PTA Acute  Rehabilitation Services Office M-F          [phone]
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
## 2024-06-17 NOTE — Progress Notes (Signed)
 Nutrition Follow-up  DOCUMENTATION CODES:   Obesity unspecified  INTERVENTION:  - TPN to decrease to 50% tonight.   - TPN management per pharmacy.  - Soft diet per Surgery.  Boost Breeze po TID, each supplement provides 250 kcal and 9 grams of protein - Encourage intake at all meals and of supplements as tolerated.   - Monitor weight trends.    NUTRITION DIAGNOSIS:   Inadequate oral intake related to nausea, vomiting as evidenced by per patient/family report. *improving  GOAL:   Patient will meet greater than or equal to 90% of their needs *on TPN and oral diet  MONITOR:   PO intake (TPN)  REASON FOR ASSESSMENT:   Other (Comment) (TPN)    ASSESSMENT:   73 y.o. female with medical history significant of HTN, HLD, GERD, gout, asthma, anxiety, hypothyroidism, T2DM, CKD stage 3b who presented to ED with complaints of abdominal pain and nausea and vomiting that started a few days prior to admission. Patient had notable CT abdomen pelvis with ascending colonic mass concerning for obstructive pathology.  11/16 Admit 11/18 TPN initiated 11/20 TPN advanced to goal rate; s/p colonoscopy, found to have severe ulcerated stricture at the hepatic flexure-noted to be concerning for malignancy (path pending) 11/21 s/p right colectomy with extended resection of distal ileum, lysis of adhesions; NGT placed 11/23 NGT removed 11/24 CLD 11/25 FLD 11/26 Soft diet  Patient reports she has been doing fairly well with a full liquid diet. There is not documented intake to assess oral intake. She admits her appetite has been poor and it is difficult for her to eat well as a result.   Per pharmacy note today, plan to decrease TPN to 50% tonight. Surgery advanced diet to Soft.   Discussed plan to decrease TPN with patient and encouraged her to work on increasing oral intake now that she will not be getting 100% of needs through TPN.  She does not like milky supplements but agreeable to  receive Boost breeze again, which she enjoyed before.    Admit weight: 177# Current weight: 194# I&O's: +14.6L since admit   Medications reviewed and include: Protonix    Labs reviewed:  Creatinine 1.06  Diet Order:   Diet Order             DIET SOFT Room service appropriate? Yes; Fluid consistency: Thin  Diet effective now                   EDUCATION NEEDS:  No education needs have been identified at this time  Skin:  Skin Assessment: Skin Integrity Issues: Skin Integrity Issues:: Incisions Incisions: Surgical on Abdomen  Last BM:  11/25 - type 7  Height:  Ht Readings from Last 1 Encounters:  06/12/24 5' 3 (1.6 m)   Weight:  Wt Readings from Last 1 Encounters:  06/17/24 88.1 kg    BMI:  Body mass index is 34.41 kg/m.  Estimated Nutritional Needs:  Kcal:  1550-1750 Protein:  75-85g Fluid:  1.7L/day    Trude Ned RD, LDN Contact via Secure Chat.

## 2024-06-18 DIAGNOSIS — F418 Other specified anxiety disorders: Secondary | ICD-10-CM | POA: Diagnosis not present

## 2024-06-18 DIAGNOSIS — E119 Type 2 diabetes mellitus without complications: Secondary | ICD-10-CM | POA: Diagnosis not present

## 2024-06-18 DIAGNOSIS — K6389 Other specified diseases of intestine: Secondary | ICD-10-CM | POA: Diagnosis not present

## 2024-06-18 DIAGNOSIS — K56699 Other intestinal obstruction unspecified as to partial versus complete obstruction: Secondary | ICD-10-CM | POA: Diagnosis not present

## 2024-06-18 LAB — CBC WITH DIFFERENTIAL/PLATELET
Abs Immature Granulocytes: 0.72 K/uL — ABNORMAL HIGH (ref 0.00–0.07)
Basophils Absolute: 0 K/uL (ref 0.0–0.1)
Basophils Relative: 0 %
Eosinophils Absolute: 0.1 K/uL (ref 0.0–0.5)
Eosinophils Relative: 0 %
HCT: 25.2 % — ABNORMAL LOW (ref 36.0–46.0)
Hemoglobin: 8 g/dL — ABNORMAL LOW (ref 12.0–15.0)
Immature Granulocytes: 4 %
Lymphocytes Relative: 7 %
Lymphs Abs: 1.2 K/uL (ref 0.7–4.0)
MCH: 28.4 pg (ref 26.0–34.0)
MCHC: 31.7 g/dL (ref 30.0–36.0)
MCV: 89.4 fL (ref 80.0–100.0)
Monocytes Absolute: 1 K/uL (ref 0.1–1.0)
Monocytes Relative: 6 %
Neutro Abs: 13.6 K/uL — ABNORMAL HIGH (ref 1.7–7.7)
Neutrophils Relative %: 83 %
Platelets: 185 K/uL (ref 150–400)
RBC: 2.82 MIL/uL — ABNORMAL LOW (ref 3.87–5.11)
RDW: 16.7 % — ABNORMAL HIGH (ref 11.5–15.5)
WBC: 16.6 K/uL — ABNORMAL HIGH (ref 4.0–10.5)
nRBC: 0.1 % (ref 0.0–0.2)

## 2024-06-18 LAB — COMPREHENSIVE METABOLIC PANEL WITH GFR
ALT: 31 U/L (ref 0–44)
AST: 48 U/L — ABNORMAL HIGH (ref 15–41)
Albumin: 2 g/dL — ABNORMAL LOW (ref 3.5–5.0)
Alkaline Phosphatase: 62 U/L (ref 38–126)
Anion gap: 8 (ref 5–15)
BUN: 19 mg/dL (ref 8–23)
CO2: 22 mmol/L (ref 22–32)
Calcium: 8 mg/dL — ABNORMAL LOW (ref 8.9–10.3)
Chloride: 111 mmol/L (ref 98–111)
Creatinine, Ser: 0.94 mg/dL (ref 0.44–1.00)
GFR, Estimated: >60 mL/min (ref >60)
Glucose, Bld: 123 mg/dL — ABNORMAL HIGH (ref 70–99)
Potassium: 3.6 mmol/L (ref 3.5–5.1)
Sodium: 141 mmol/L (ref 135–145)
Total Bilirubin: 0.3 mg/dL (ref 0.0–1.2)
Total Protein: 4.8 g/dL — ABNORMAL LOW (ref 6.5–8.1)

## 2024-06-18 LAB — PHOSPHORUS: Phosphorus: 3.1 mg/dL (ref 2.5–4.6)

## 2024-06-18 LAB — GLUCOSE, CAPILLARY
Glucose-Capillary: 151 mg/dL — ABNORMAL HIGH (ref 70–99)
Glucose-Capillary: 133 mg/dL — ABNORMAL HIGH (ref 70–99)

## 2024-06-18 LAB — MAGNESIUM: Magnesium: 1.8 mg/dL (ref 1.7–2.4)

## 2024-06-18 NOTE — Progress Notes (Deleted)
 PHARMACY - TOTAL PARENTERAL NUTRITION CONSULT NOTE   Indication: Planned SBR  Patient Measurements: Height: 5' 3 (160 cm) Weight: 89.3 kg (196 lb 13.9 oz) IBW/kg (Calculated) : 52.4 TPN AdjBW (KG): 60.9 Body mass index is 34.87 kg/m.  Assessment: Pt presented with abdominal pain, N/V, unable to tolerate PO intake. Pt s/p colonoscopy with concern for malignancy (pathology pending) as well as colectomy. Pharmacy consulted to manage TPN.  Glucose / Insulin : hx T2DM, not on medications PTA. A1c 5.4 -BG goal <180. Range: 123-153 (7 units SSI/24 hrs) Electrolytes: All WNL, including CorrCa (9.3) Renal: CKD. SCr lower than baseline, BUN WNL/stable Hepatic: AST 18>48 watch, Alk Phos, T.bili WNL. Albumin  low Intake / Output; MIVF: no mIVF. Strict I/O not charted. -UOP: x6 unmeasured.  - LBM 11/26 - po 720, Boost Breeze x 2 given yesterday 11/26 GI Imaging: - 11/16 CT:  Enlarged appendix concerning for mild acute appendicitis. Concern for mass/neoplasm in ascending colon. 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 Mild diverticulosis with no evidence of diverticular bleeding.  - 11/21 laparoscopic right colectomy with extended resection of distal ileum  Central access: PICC 11/18 TPN start date: 11/18  Nutritional Goals: Goal TPN rate is 75mL/hr (provides 84 g of protein and 1700 kcals per day)  RD Assessment: TBD Estimated Needs Total Energy Estimated Needs: 1550-1750 Total Protein Estimated Needs: 75-85g Total Fluid Estimated Needs: 1.7L/day  Current Nutrition:  TPN Soft diet  Plan:   At 1800: Reduce TPN to 40 mL/hr Will provide 48g protein, 972 kcal Electrolytes in TPN: No change Na 50 mEq/L, K 40 mEq/L, Ca 5 mEq/L, Mg 4 mEq/L, Phos 20 mmol/L Cl:Ac 1:1  Add standard MVI and trace elements to TPN continue Sensitive q6h SSI and adjust as needed Monitor TPN labs on  Mon/Thurs.  Kassius Battiste Karoline Marina, PharmD, BCPS Clinical Staff Pharmacist Marina Camelia Karoline, PharmD 06/18/24 8:11 AM

## 2024-06-18 NOTE — Progress Notes (Signed)
 6 Days Post-Op   Subjective/Chief Complaint: No complaints Tolerating po and having BM's   Denies blood in her stool   Objective: Vital signs in last 24 hours: Temp:  [98.7 F (37.1 C)-100.3 F (37.9 C)] 98.8 F (37.1 C) (11/27 0504) Pulse Rate:  [92-97] 92 (11/27 0504) Resp:  [17-19] 19 (11/27 0504) BP: (158-170)/(71-78) 158/72 (11/27 0506) SpO2:  [98 %-100 %] 100 % (11/27 0504) Weight:  [89.3 kg] 89.3 kg (11/27 0504) Last BM Date : 06/17/24  Intake/Output from previous day: 11/26 0701 - 11/27 0700 In: 1795.6 [P.O.:720; I.V.:1075.6] Out: 300 [Urine:300] Intake/Output this shift: No intake/output data recorded.  Exam: Awake and alert Looks comfortable Abdomen soft, dressing dry and intact  Lab Results:  Recent Labs    06/16/24 0446 06/17/24 0153  WBC 11.0* 11.6*  HGB 8.2* 7.7*  HCT 25.5* 24.3*  PLT 135* 142*   BMET Recent Labs    06/17/24 0153 06/18/24 0431  NA 141 141  K 3.8 3.6  CL 111 111  CO2 24 22  GLUCOSE 130* 123*  BUN 18 19  CREATININE 1.06* 0.94  CALCIUM  7.9* 8.0*   PT/INR No results for input(s): LABPROT, INR in the last 72 hours. ABG No results for input(s): PHART, HCO3 in the last 72 hours.  Invalid input(s): PCO2, PO2  Studies/Results: No results found.  Anti-infectives: Anti-infectives (From admission, onward)    Start     Dose/Rate Route Frequency Ordered Stop   06/12/24 1000  cefoTEtan  (CEFOTAN ) 2 g in sodium chloride  0.9 % 100 mL IVPB        2 g 200 mL/hr over 30 Minutes Intravenous On call to O.R. 06/12/24 0948 06/12/24 1315       Assessment/Plan: POD6: S/P LAPAROSCOPIC ASSISTED  RIGHT COLECTOMY WITH EXTENDED RESECTION OF DISTAL ILEUM, Laparoscopic lysis of adhesions for 20-minute by Dr. Tanda 11/21   -CBC pending this morning -tolerated soft diet.  Will D/C TPN -appreciate oncology's consult  Vicenta Poli 06/18/2024

## 2024-06-18 NOTE — Progress Notes (Signed)
 [facility]                                                                               Brooke [name], is a 73 y.o. female, DOB - 05-Jan-1951, FMW:[id number] Admit date - 06/07/2024    Outpatient Primary MD for the patient is Norleen Lynwood ORN, MD  LOS - 11  days    Brief summary   Brooke [name] is a 73 y.o. female with past medical history significant for HTN, HLD, DM2, asthma, anxiety, hypothyroidism, CKD stage IIIb, GERD who presented to [facility] ED on 06/07/2024 from home with complaint of abdominal pain associated with nausea and vomiting.  Poor appetite.  Pain is localized to the epigastric region and rated as a 6/10, sharp and constant.  No radiation.  Food exacerbates symptoms.  Denies diarrhea, no fever/chills.  Denies any blood in her stool.  Further denies chest pain, no palpitations, no shortness of breath, no cough, no urinary symptoms.   Last colonoscopy 12/2019 with diverticulosis of the sigmoid colon, otherwise normal.  No family history of colon cancer.   In the ED, temperature 98.1 F, HR 78, RR 18, BP 152/82, SpO2 99% on room air.  WBC 8.5, hemoglobin 10.3, platelet count 322.  Sodium 141, potassium 4.1, chloride 109, CO2 22, glucose 108, BUN 13, creat 1.61.  Alkaline phosphatase 180.  Lipase less than 10.  AST 27, ALT 12, total bilirubin 0.3.  Procalcitonin 0.33.  Lactic acid 2.0.  Vitamin B12 1004 and 76.  Iron 39, TIBC 207, ferritin 142.  Urinalysis with negative leukocytes negative nitrite, rare bacteria, 0-5 WBCs.  CT abdomen/pelvis with contrast with findings of enlarged appendix measuring 1.3 cm in diameter with enhancement and mild adjacent free fluid in the right lower quadrant concerning for mild acute appendicitis, focal circumferential narrowing with slight associated increased soft tissue density/enhancement over the ascending colon in the region of the hepatic flexure concerning for a focal mass/neoplasm, wall thickening of a moderate segment of distal ileum  may be due to small bowel enteritis versus infectious versus inflammatory in nature, mild free peritoneal fluid, several liver cyst unchanged, 2.8 cm hyperdense mass over the upper pole left kidney unchanged compatible with slightly hyperdense cyst, aortic atherosclerosis.  General surgery was consulted.  TRH consulted for admission for further evaluation and management.   Assessment & Plan   Nausea vomiting abdominal pain on admission CT of the abdomen and pelvis showing enlarged appendix of 1.3 cm in diameter, focal circumferential narrowing with slightly associated increased soft tissue density/enhancement over the ascending colon in the region of hepatic flexure concerning for focal mass/neoplasm Colonoscopy on 11/20 showed severe ulcerated stricture at the hepatic flexure concerning for malignancy. Ascending colon mass secondary to invasive adenocarcinoma, moderately differentiated Status post laparoscopic-assisted right colectomy with extended resection of distal ileum and lysis of additions by Dr. Tanda on 11/21. Oncology consulted Patient started on diet able to tolerate liquid diet without any issues advance to soft diet today. Patient is tolerating diet without any issues. D/c TPN as per gen surgery.  Pain control with IV morphine  and antiemetics    Hypokalemia, hypophosphatemia and hypomagnesemia Replaced  Acute postop ANEMIA Status status post 3 units of PRBC transfusion Hemoglobin continues to trickle down posttransfusion currently at 7.7. Transfuse to keep it greater than 7.    Mild leukocytosis Continue to monitor for signs of infection   Hypertension Blood pressure parameters improving.  Added hydralazine  as needed to Cardizem  and metoprolol    Hyperlipidemia Patient on Crestor  continue the same.   Type 2 diabetes mellitus, diet controlled Hemoglobin A1c at 5.4 Continue sliding scale insulin  CBG (last 3)  Recent Labs    06/17/24 2336 06/18/24 0531  06/18/24 0726  GLUCAP 146* 151* 133*       Stage IIIb CKD Creatinine appears to be at baseline today   History of depression and anxiety Resume Celexa    Body mass index is 34.87 kg/m. Obesity class I   GERD Stable   RN Pressure Injury Documentation:    Malnutrition Type:  Nutrition Problem: Inadequate oral intake Etiology: nausea, vomiting   Malnutrition Characteristics:  Signs/Symptoms: per patient/family report   Nutrition Interventions:  Interventions: TPN  Estimated body mass index is 34.87 kg/m as calculated from the following:   Height as of this encounter: 5' 3 (1.6 m).   Weight as of this encounter: 89.3 kg.  Code Status: Full code DVT Prophylaxis:  SCDs Start: 06/07/24 2000 Place TED hose Start: 06/07/24 2000   Level of Care: Level of care: Progressive Family Communication: None at bedside  Disposition Plan:     Remains inpatient appropriate: Pending clinical improvement,  Procedures:  Status post laparoscopic-assisted right colectomy with extended resection of distal ileum Laparoscopic lysis of adhesions by Dr. Tanda on 11/21  Consultants:   General Surgery Oncology  Antimicrobials:   Anti-infectives (From admission, onward)    Start     Dose/Rate Route Frequency Ordered Stop   06/12/24 1000  cefoTEtan  (CEFOTAN ) 2 g in sodium chloride  0.9 % 100 mL IVPB        2 g 200 mL/hr over 30 Minutes Intravenous On call to O.R. 06/12/24 0948 06/12/24 1315        Medications  Scheduled Meds:  allopurinol   100 mg Oral Daily   atropine   1 drop Left Eye Daily   azelastine   2 spray Each Nare BID   brimonidine   1 drop Left Eye BID   budesonide -glycopyrrolate -formoterol   2 puff Inhalation BID   Chlorhexidine  Gluconate Cloth  6 each Topical Daily   citalopram   40 mg Oral Daily   diltiazem   180 mg Oral Daily   erythromycin    Left Eye QHS   feeding supplement  1 Container Oral TID BM   insulin  aspart  0-9 Units Subcutaneous Q6H    latanoprost   1 drop Left Eye QHS   loratadine   10 mg Oral Daily   metoprolol  succinate  100 mg Oral Daily   pantoprazole  (PROTONIX ) IV  40 mg Intravenous Q24H   rosuvastatin   40 mg Oral Daily   sodium bicarbonate   1,300 mg Oral Daily   sodium chloride  flush  10-40 mL Intracatheter Q12H   Continuous Infusions:   PRN Meds:.hydrALAZINE , methocarbamol  (ROBAXIN ) injection, metoprolol  tartrate, morphine  injection, ondansetron  **OR** ondansetron  (ZOFRAN ) IV, mouth rinse, sodium chloride  flush, sodium chloride  flush, traZODone     Subjective:   Yianna Dubiel was seen and examined today.  No new complaints today.     Objective:   Vitals:   06/17/24 2338 06/18/24 0504 06/18/24 0504 06/18/24 0506  BP:   (!) 168/75 (!) 158/72  Pulse:   92   Resp:   19  Temp: 99.4 F (37.4 C)  98.8 F (37.1 C)   TempSrc: Oral  Oral   SpO2:   100%   Weight:  89.3 kg    Height:        Intake/Output Summary (Last 24 hours) at 06/18/2024 1158 Last data filed at 06/18/2024 0930 Gross per 24 hour  Intake 1915.59 ml  Output 100 ml  Net 1815.59 ml   Filed Weights   06/16/24 0852 06/17/24 0500 06/18/24 0504  Weight: 87.4 kg 88.1 kg 89.3 kg     Exam General exam: Appears calm and comfortable  Respiratory system: Clear to auscultation. Respiratory effort normal. Cardiovascular system: S1 & S2 heard, RRR. No JVD Gastrointestinal system: Abdomen is nondistended, soft and nontender.  Central nervous system: Alert and oriented.  Extremities: Symmetric 5 x 5 power. Skin: No rashes,  Psychiatry: Mood & affect appropriate.     Data Reviewed:  I have personally reviewed following labs and imaging studies   CBC Lab Results  Component Value Date   WBC 16.6 (H) 06/18/2024   RBC 2.82 (L) 06/18/2024   HGB 8.0 (L) 06/18/2024   HCT 25.2 (L) 06/18/2024   MCV 89.4 06/18/2024   MCH 28.4 06/18/2024   PLT 185 06/18/2024   MCHC 31.7 06/18/2024   RDW 16.7 (H) 06/18/2024   LYMPHSABS 1.2 06/18/2024    MONOABS 1.0 06/18/2024   EOSABS 0.1 06/18/2024   BASOSABS 0.0 06/18/2024     Last metabolic panel Lab Results  Component Value Date   NA 141 06/18/2024   K 3.6 06/18/2024   CL 111 06/18/2024   CO2 22 06/18/2024   BUN 19 06/18/2024   CREATININE 0.94 06/18/2024   GLUCOSE 123 (H) 06/18/2024   GFRNONAA >60 06/18/2024   GFRAA 39 (L) 09/24/2018   CALCIUM  8.0 (L) 06/18/2024   PHOS 3.1 06/18/2024   PROT 4.8 (L) 06/18/2024   ALBUMIN  2.0 (L) 06/18/2024   LABGLOB 2.3 06/01/2024   AGRATIO 1.7 10/27/2021   BILITOT 0.3 06/18/2024   ALKPHOS 62 06/18/2024   AST 48 (H) 06/18/2024   ALT 31 06/18/2024   ANIONGAP 8 06/18/2024    CBG (last 3)  Recent Labs    06/17/24 2336 06/18/24 0531 06/18/24 0726  GLUCAP 146* 151* 133*      Coagulation Profile: No results for input(s): INR, PROTIME in the last 168 hours.   Radiology Studies: No results found.     Elgie Butter M.D. [facility] 06/18/2024, 11:58 AM  Available via Epic secure chat 7am-7pm After 7 pm, please refer to night coverage provider listed on amion.

## 2024-06-18 NOTE — Progress Notes (Signed)
 Mobility Specialist - Progress Note   06/18/24 1236  Mobility  Activity Ambulated with assistance  Level of Assistance Contact guard assist, steadying assist  Assistive Device Front wheel walker  Distance Ambulated (ft) 15 ft  Range of Motion/Exercises Active  Activity Response Tolerated poorly  Mobility visit 1 Mobility  Mobility Specialist Start Time (ACUTE ONLY) 1220  Mobility Specialist Stop Time (ACUTE ONLY) 1236  Mobility Specialist Time Calculation (min) (ACUTE ONLY) 16 min   Pt was found on recliner chair and agreeable to mobilize. After taking a couple steps c/o having the shakes. RN grabbed recliner chair and pt was assisted back to room. At EOS was left on recliner chair with all needs met. Call bell in reach.  Erminio Leos,  Mobility Specialist Can be reached via Secure Chat

## 2024-06-19 DIAGNOSIS — K6389 Other specified diseases of intestine: Secondary | ICD-10-CM | POA: Diagnosis not present

## 2024-06-19 DIAGNOSIS — F418 Other specified anxiety disorders: Secondary | ICD-10-CM | POA: Diagnosis not present

## 2024-06-19 DIAGNOSIS — C182 Malignant neoplasm of ascending colon: Secondary | ICD-10-CM

## 2024-06-19 DIAGNOSIS — K56699 Other intestinal obstruction unspecified as to partial versus complete obstruction: Secondary | ICD-10-CM | POA: Diagnosis not present

## 2024-06-19 DIAGNOSIS — E119 Type 2 diabetes mellitus without complications: Secondary | ICD-10-CM | POA: Diagnosis not present

## 2024-06-19 HISTORY — DX: Malignant neoplasm of ascending colon: C18.2

## 2024-06-19 LAB — CBC WITH DIFFERENTIAL/PLATELET
Abs Immature Granulocytes: 0.26 K/uL — ABNORMAL HIGH (ref 0.00–0.07)
Basophils Absolute: 0 K/uL (ref 0.0–0.1)
Basophils Relative: 0 %
Eosinophils Absolute: 0 K/uL (ref 0.0–0.5)
Eosinophils Relative: 0 %
HCT: 24.9 % — ABNORMAL LOW (ref 36.0–46.0)
Hemoglobin: 7.8 g/dL — ABNORMAL LOW (ref 12.0–15.0)
Immature Granulocytes: 2 %
Lymphocytes Relative: 9 %
Lymphs Abs: 1.2 K/uL (ref 0.7–4.0)
MCH: 27.8 pg (ref 26.0–34.0)
MCHC: 31.3 g/dL (ref 30.0–36.0)
MCV: 88.6 fL (ref 80.0–100.0)
Monocytes Absolute: 0.7 K/uL (ref 0.1–1.0)
Monocytes Relative: 5 %
Neutro Abs: 11.3 K/uL — ABNORMAL HIGH (ref 1.7–7.7)
Neutrophils Relative %: 84 %
Platelets: 243 K/uL (ref 150–400)
RBC: 2.81 MIL/uL — ABNORMAL LOW (ref 3.87–5.11)
RDW: 16.3 % — ABNORMAL HIGH (ref 11.5–15.5)
WBC: 13.5 K/uL — ABNORMAL HIGH (ref 4.0–10.5)
nRBC: 0 % (ref 0.0–0.2)

## 2024-06-19 LAB — BASIC METABOLIC PANEL WITH GFR
Anion gap: 10 (ref 5–15)
BUN: 18 mg/dL (ref 8–23)
CO2: 20 mmol/L — ABNORMAL LOW (ref 22–32)
Calcium: 7.9 mg/dL — ABNORMAL LOW (ref 8.9–10.3)
Chloride: 110 mmol/L (ref 98–111)
Creatinine, Ser: 1.06 mg/dL — ABNORMAL HIGH (ref 0.44–1.00)
GFR, Estimated: 56 mL/min — ABNORMAL LOW (ref >60)
Glucose, Bld: 173 mg/dL — ABNORMAL HIGH (ref 70–99)
Potassium: 3.1 mmol/L — ABNORMAL LOW (ref 3.5–5.1)
Sodium: 140 mmol/L (ref 135–145)

## 2024-06-19 LAB — GLUCOSE, CAPILLARY
Glucose-Capillary: 106 mg/dL — ABNORMAL HIGH (ref 70–99)
Glucose-Capillary: 127 mg/dL — ABNORMAL HIGH (ref 70–99)
Glucose-Capillary: 173 mg/dL — ABNORMAL HIGH (ref 70–99)

## 2024-06-19 LAB — MAGNESIUM: Magnesium: 1.5 mg/dL — ABNORMAL LOW (ref 1.7–2.4)

## 2024-06-19 MED ORDER — MAGNESIUM SULFATE 4 GM/100ML IV SOLN
4.0000 g | Freq: Once | INTRAVENOUS | Status: AC
  Administered 2024-06-19: 4 g via INTRAVENOUS
  Filled 2024-06-19: qty 100

## 2024-06-19 MED ORDER — POTASSIUM CHLORIDE CRYS ER 20 MEQ PO TBCR
40.0000 meq | EXTENDED_RELEASE_TABLET | Freq: Two times a day (BID) | ORAL | Status: AC
  Administered 2024-06-19 – 2024-06-20 (×2): 40 meq via ORAL
  Filled 2024-06-19 (×2): qty 2

## 2024-06-19 MED ORDER — PANTOPRAZOLE SODIUM 40 MG PO TBEC
40.0000 mg | DELAYED_RELEASE_TABLET | Freq: Every day | ORAL | Status: DC
  Administered 2024-06-19 – 2024-06-21 (×3): 40 mg via ORAL
  Filled 2024-06-19 (×3): qty 1

## 2024-06-19 NOTE — Plan of Care (Signed)
  Problem: Education: Goal: Knowledge of General Education information will improve Description: Including pain rating scale, medication(s)/side effects and non-pharmacologic comfort measures Outcome: Progressing   Problem: Health Behavior/Discharge Planning: Goal: Ability to manage health-related needs will improve Outcome: Progressing   Problem: Clinical Measurements: Goal: Will remain free from infection Outcome: Progressing Goal: Respiratory complications will improve Outcome: Progressing Goal: Cardiovascular complication will be avoided Outcome: Progressing   Problem: Activity: Goal: Risk for activity intolerance will decrease Outcome: Progressing   Problem: Nutrition: Goal: Adequate nutrition will be maintained Outcome: Progressing   Problem: Coping: Goal: Level of anxiety will decrease Outcome: Progressing   Problem: Elimination: Goal: Will not experience complications related to urinary retention Outcome: Progressing   Problem: Pain Managment: Goal: General experience of comfort will improve and/or be controlled Outcome: Progressing   Problem: Safety: Goal: Ability to remain free from injury will improve Outcome: Progressing   Problem: Skin Integrity: Goal: Risk for impaired skin integrity will decrease Outcome: Progressing   Problem: Education: Goal: Ability to describe self-care measures that may prevent or decrease complications (Diabetes Survival Skills Education) will improve Outcome: Progressing   Problem: Coping: Goal: Ability to adjust to condition or change in health will improve Outcome: Progressing

## 2024-06-19 NOTE — Progress Notes (Signed)
 Physical Therapy Treatment Patient Details Name: Brooke [name] MRN: [id number] DOB: [birth year] Today's Date: 06/19/2024   History of Present Illness Brooke [name] is a 73 y.o. female who presented to [facility] ED on 06/07/2024 from home with complaint of abdominal pain associated with nausea and vomiting; s/p  LAPAROSCOPIC ASSISTED  RIGHT COLECTOMY WITH EXTENDED RESECTION OF DISTAL ILEUM 11/21. PMH: HTN, HLD, DM2, asthma, anxiety, hypothyroidism, CKD stage IIIb, GERD    PT Comments  AxO x 3 pleasant and willing.  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 Pt was OOB in recliner.  Assisted with amb a greater distance in hallway.  General Gait Details: tolerated a functional distance 96 feet using a walker with one seated rest break then amb another 95 feet back to her room. Pt progressing well with her mobility.  Pt eager to go home.  Educated her that recovery from this type of surgery can be slow.  Pt is a mover and admits to not laying around this much.  Diet continue to advance.  Now on softs but did express some ABD discomfort during session.   Pt plans to D/C to home with family support.  LPT has rec HH PT.     If plan is discharge home, recommend the following: A little help with bathing/dressing/bathroom;Help with stairs or ramp for entrance;Assist for transportation;Assistance with cooking/housework;A little help with walking and/or transfers   Can travel by private vehicle        Equipment Recommendations  None recommended by PT    Recommendations for Other Services       Precautions / Restrictions Restrictions Weight Bearing Restrictions Per Provider Order: No     Mobility  Bed Mobility               General bed mobility comments: OOB in recliner    Transfers Overall transfer level: Needs assistance Equipment used: Rolling walker (2 wheels), None Transfers: Sit to/from Stand Sit to Stand: Supervision, Contact guard assist           General  transfer comment: good use of hands to steady self and self rise.    Ambulation/Gait Ambulation/Gait assistance: Supervision, Contact guard assist Gait Distance (Feet): 95 Feet Assistive device: Rolling walker (2 wheels) Gait Pattern/deviations: Step-through pattern, Decreased step length - left, Decreased step length - right, Decreased stride length Gait velocity: decr     General Gait Details: tolerated a functional distance 96 feet using a walker with one seated rest break then amb another 95 feet back to her room.   Stairs             Wheelchair Mobility     Tilt Bed    Modified Rankin (Stroke Patients Only)       Balance                                            Communication    Cognition   Behavior During Therapy: WFL for tasks assessed/performed   PT - Cognitive impairments: No apparent impairments                       PT - Cognition Comments: AxO x 3 pleasant and willing.  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        Cueing    Exercises      General  Comments        Pertinent Vitals/Pain Pain Assessment Pain Assessment: Faces Faces Pain Scale: Hurts little more Pain Location: abdomen Pain Descriptors / Indicators: Operative site guarding Pain Intervention(s): Monitored during session, Premedicated before session, Repositioned    Home Living                          Prior Function            PT Goals (current goals can now be found in the care plan section) Progress towards PT goals: Progressing toward goals    Frequency           PT Plan      Co-evaluation              AM-PAC PT 6 Clicks Mobility   Outcome Measure  Help needed turning from your back to your side while in a flat bed without using bedrails?: A Little Help needed moving from lying on your back to sitting on the side of a flat bed without using bedrails?: A Little Help needed moving to and from a  bed to a chair (including a wheelchair)?: A Little Help needed standing up from a chair using your arms (e.g., wheelchair or bedside chair)?: A Little Help needed to walk in hospital room?: A Little Help needed climbing 3-5 steps with a railing? : A Little 6 Click Score: 18    End of Session Equipment Utilized During Treatment: Gait belt Activity Tolerance: Patient tolerated treatment well Patient left: in chair;with call bell/phone within reach Nurse Communication: Mobility status PT Visit Diagnosis: Muscle weakness (generalized) (M62.81);Difficulty in walking, not elsewhere classified (R26.2);Pain     Time: 1000-1024 PT Time Calculation (min) (ACUTE ONLY): 24 min  Charges:    $Gait Training: 8-22 mins $Therapeutic Activity: 8-22 mins PT General Charges $$ ACUTE PT VISIT: 1 Visit                     Katheryn Leap  PTA Acute  Rehabilitation Services Office M-F          [phone]

## 2024-06-19 NOTE — Progress Notes (Signed)
 [facility]                                                                               Brooke [name], is a 73 y.o. female, DOB - 06/12/1951, FMW:[id number] Admit date - 06/07/2024    Outpatient Primary MD for the patient is Norleen Lynwood ORN, MD  LOS - 12  days    Brief summary   Brooke [name] is a 73 y.o. female with past medical history significant for HTN, HLD, DM2, asthma, anxiety, hypothyroidism, CKD stage IIIb, GERD who presented to [facility] ED on 06/07/2024 from home with complaint of abdominal pain associated with nausea and vomiting.  Poor appetite.  Pain is localized to the epigastric region and rated as a 6/10, sharp and constant.  No radiation.  Food exacerbates symptoms.  Denies diarrhea, no fever/chills.  Denies any blood in her stool.  Further denies chest pain, no palpitations, no shortness of breath, no cough, no urinary symptoms.   Last colonoscopy 12/2019 with diverticulosis of the sigmoid colon, otherwise normal.  No family history of colon cancer.   In the ED, temperature 98.1 F, HR 78, RR 18, BP 152/82, SpO2 99% on room air.  WBC 8.5, hemoglobin 10.3, platelet count 322.  Sodium 141, potassium 4.1, chloride 109, CO2 22, glucose 108, BUN 13, creat 1.61.  Alkaline phosphatase 180.  Lipase less than 10.  AST 27, ALT 12, total bilirubin 0.3.  Procalcitonin 0.33.  Lactic acid 2.0.  Vitamin B12 1004 and 76.  Iron 39, TIBC 207, ferritin 142.  Urinalysis with negative leukocytes negative nitrite, rare bacteria, 0-5 WBCs.  CT abdomen/pelvis with contrast with findings of enlarged appendix measuring 1.3 cm in diameter with enhancement and mild adjacent free fluid in the right lower quadrant concerning for mild acute appendicitis, focal circumferential narrowing with slight associated increased soft tissue density/enhancement over the ascending colon in the region of the hepatic flexure concerning for a focal mass/neoplasm, wall thickening of a moderate segment of distal ileum  may be due to small bowel enteritis versus infectious versus inflammatory in nature, mild free peritoneal fluid, several liver cyst unchanged, 2.8 cm hyperdense mass over the upper pole left kidney unchanged compatible with slightly hyperdense cyst, aortic atherosclerosis.  General surgery was consulted.  TRH consulted for admission for further evaluation and management.   Assessment & Plan   Nausea vomiting abdominal pain on admission CT of the abdomen and pelvis showing enlarged appendix of 1.3 cm in diameter, focal circumferential narrowing with slightly associated increased soft tissue density/enhancement over the ascending colon in the region of hepatic flexure concerning for focal mass/neoplasm Colonoscopy on 11/20 showed severe ulcerated stricture at the hepatic flexure concerning for malignancy. Ascending colon mass secondary to invasive adenocarcinoma, moderately differentiated Status post laparoscopic-assisted right colectomy with extended resection of distal ileum and lysis of additions by Dr. Tanda on 11/21. Oncology consulted Patient started on diet able to tolerate liquid diet without any issues advance to soft diet today. Patient is tolerating diet without any issues. D/c TPN as per gen surgery.  Patient reports some abdominal pain, since surgery, which hasn't worsened. She reports some shakes, that she gets occasionally, not  chills.  She denies any nausea or vomiting.     Hypokalemia, hypophosphatemia and hypomagnesemia Replaced  Repeat levels show potassium of 3.1 and mag of 1.5. replaced today.  Repeat levels in am.     Acute postop anemia Status status post 3 units of PRBC transfusion Hemoglobin continues to trickle down posttransfusion currently at 7.7. Transfuse to keep it greater than 7.    Leukocytosis Wbc count on 11/27 is 16,600 improved to 13,500 today.  No fever or chills.  The incisions look C/D/I.  She denies any worsening abdominal pain.      Hypertension Blood pressure parameters are optimal.  Added hydralazine  as needed to Cardizem  and metoprolol    Hyperlipidemia Patient on Crestor  continue the same.   Type 2 diabetes mellitus, diet controlled Hemoglobin A1c at 5.4 Continue sliding scale insulin  CBG (last 3)  Recent Labs    06/18/24 0531 06/18/24 0726 06/19/24 1130  GLUCAP 151* 133* 173*   No change in meds.     Stage IIIb CKD Creatinine appears to be stable.    History of depression and anxiety Resume Celexa    Body mass index is 34.87 kg/m. Obesity class I   GERD Stable   RN Pressure Injury Documentation:    Malnutrition Type:  Nutrition Problem: Inadequate oral intake Etiology: nausea, vomiting   Malnutrition Characteristics:  Signs/Symptoms: per patient/family report   Nutrition Interventions:  Interventions: TPN  Estimated body mass index is 34.87 kg/m as calculated from the following:   Height as of this encounter: 5' 3 (1.6 m).   Weight as of this encounter: 89.3 kg.  Code Status: Full code DVT Prophylaxis:  SCDs Start: 06/07/24 2000 Place TED hose Start: 06/07/24 2000   Level of Care: Level of care: Progressive Family Communication: None at bedside  Disposition Plan:     Remains inpatient appropriate: Pending clinical improvement,  Procedures:  Status post laparoscopic-assisted right colectomy with extended resection of distal ileum Laparoscopic lysis of adhesions by Dr. Tanda on 11/21  Consultants:   General Surgery Oncology  Antimicrobials:   Anti-infectives (From admission, onward)    Start     Dose/Rate Route Frequency Ordered Stop   06/12/24 1000  cefoTEtan  (CEFOTAN ) 2 g in sodium chloride  0.9 % 100 mL IVPB        2 g 200 mL/hr over 30 Minutes Intravenous On call to O.R. 06/12/24 0948 06/12/24 1315        Medications  Scheduled Meds:  allopurinol   100 mg Oral Daily   atropine   1 drop Left Eye Daily   azelastine   2 spray Each Nare  BID   brimonidine   1 drop Left Eye BID   budesonide -glycopyrrolate -formoterol   2 puff Inhalation BID   Chlorhexidine  Gluconate Cloth  6 each Topical Daily   citalopram   40 mg Oral Daily   diltiazem   180 mg Oral Daily   erythromycin    Left Eye QHS   feeding supplement  1 Container Oral TID BM   insulin  aspart  0-9 Units Subcutaneous Q6H   latanoprost   1 drop Left Eye QHS   loratadine   10 mg Oral Daily   metoprolol  succinate  100 mg Oral Daily   pantoprazole   40 mg Oral Daily   potassium chloride   40 mEq Oral BID   rosuvastatin   40 mg Oral Daily   sodium bicarbonate   1,300 mg Oral Daily   sodium chloride  flush  10-40 mL Intracatheter Q12H   Continuous Infusions:  magnesium  sulfate bolus IVPB  PRN Meds:.hydrALAZINE , methocarbamol  (ROBAXIN ) injection, metoprolol  tartrate, morphine  injection, ondansetron  **OR** ondansetron  (ZOFRAN ) IV, mouth rinse, sodium chloride  flush, sodium chloride  flush, traZODone     Subjective:   Brooke [name] was seen and examined today.  Working with PT. No new complaints.     Objective:   Vitals:   06/19/24 0330 06/19/24 0413 06/19/24 0817 06/19/24 1334  BP: (!) 146/65  (!) 139/59 (!) 140/66  Pulse: 88  94 94  Resp: 19   16  Temp: 99.2 F (37.3 C)   99.3 F (37.4 C)  TempSrc: Oral   Oral  SpO2: 99%  99% 97%  Weight:  89.3 kg    Height:       No intake or output data in the 24 hours ending 06/19/24 1353  Filed Weights   06/17/24 0500 06/18/24 0504 06/19/24 0413  Weight: 88.1 kg 89.3 kg 89.3 kg     Exam General exam: Appears calm and comfortable  Respiratory system: Clear to auscultation. Respiratory effort normal. Cardiovascular system: S1 & S2 heard, RRR. Gastrointestinal system: Abdomen is nondistended, soft and non tender Central nervous system: Alert and oriented. No focal neurological deficits. Extremities: Symmetric 5 x 5 power. Skin: staples in place , incision on the abd wall are c/d/i Psychiatry: Mood & affect appropriate.       Data Reviewed:  I have personally reviewed following labs and imaging studies   CBC Lab Results  Component Value Date   WBC 13.5 (H) 06/19/2024   RBC 2.81 (L) 06/19/2024   HGB 7.8 (L) 06/19/2024   HCT 24.9 (L) 06/19/2024   MCV 88.6 06/19/2024   MCH 27.8 06/19/2024   PLT 243 06/19/2024   MCHC 31.3 06/19/2024   RDW 16.3 (H) 06/19/2024   LYMPHSABS 1.2 06/19/2024   MONOABS 0.7 06/19/2024   EOSABS 0.0 06/19/2024   BASOSABS 0.0 06/19/2024     Last metabolic panel Lab Results  Component Value Date   NA 140 06/19/2024   K 3.1 (L) 06/19/2024   CL 110 06/19/2024   CO2 20 (L) 06/19/2024   BUN 18 06/19/2024   CREATININE 1.06 (H) 06/19/2024   GLUCOSE 173 (H) 06/19/2024   GFRNONAA 56 (L) 06/19/2024   GFRAA 39 (L) 09/24/2018   CALCIUM  7.9 (L) 06/19/2024   PHOS 3.1 06/18/2024   PROT 4.8 (L) 06/18/2024   ALBUMIN  2.0 (L) 06/18/2024   LABGLOB 2.3 06/01/2024   AGRATIO 1.7 10/27/2021   BILITOT 0.3 06/18/2024   ALKPHOS 62 06/18/2024   AST 48 (H) 06/18/2024   ALT 31 06/18/2024   ANIONGAP 10 06/19/2024    CBG (last 3)  Recent Labs    06/18/24 0531 06/18/24 0726 06/19/24 1130  GLUCAP 151* 133* 173*      Coagulation Profile: No results for input(s): INR, PROTIME in the last 168 hours.   Radiology Studies: No results found.     Elgie Butter M.D. [facility] 06/19/2024, 1:53 PM  Available via Epic secure chat 7am-7pm After 7 pm, please refer to night coverage provider listed on amion.

## 2024-06-19 NOTE — Progress Notes (Signed)
 7 Days Post-Op   Subjective/Chief Complaint: No complaints Had BM Tolerating soft diet   Objective: Vital signs in last 24 hours: Temp:  [99.2 F (37.3 C)-99.7 F (37.6 C)] 99.2 F (37.3 C) (11/28 0330) Pulse Rate:  [88-96] 88 (11/28 0330) Resp:  [17-19] 19 (11/28 0330) BP: (137-146)/(65-71) 146/65 (11/28 0330) SpO2:  [96 %-99 %] 99 % (11/28 0330) Weight:  [89.3 kg] 89.3 kg (11/28 0413) Last BM Date : 06/17/24  Intake/Output from previous day: 11/27 0701 - 11/28 0700 In: 720 [P.O.:720] Out: -  Intake/Output this shift: No intake/output data recorded.  Exam: Awake and alert Abdomen soft, dressings removed and incisions are clean, non-distended  Lab Results:  Recent Labs    06/17/24 0153 06/18/24 0922  WBC 11.6* 16.6*  HGB 7.7* 8.0*  HCT 24.3* 25.2*  PLT 142* 185   BMET Recent Labs    06/17/24 0153 06/18/24 0431  NA 141 141  K 3.8 3.6  CL 111 111  CO2 24 22  GLUCOSE 130* 123*  BUN 18 19  CREATININE 1.06* 0.94  CALCIUM  7.9* 8.0*   PT/INR No results for input(s): LABPROT, INR in the last 72 hours. ABG No results for input(s): PHART, HCO3 in the last 72 hours.  Invalid input(s): PCO2, PO2  Studies/Results: No results found.  Anti-infectives: Anti-infectives (From admission, onward)    Start     Dose/Rate Route Frequency Ordered Stop   06/12/24 1000  cefoTEtan  (CEFOTAN ) 2 g in sodium chloride  0.9 % 100 mL IVPB        2 g 200 mL/hr over 30 Minutes Intravenous On call to O.R. 06/12/24 0948 06/12/24 1315       Assessment/Plan: POD7: S/P LAPAROSCOPIC ASSISTED  RIGHT COLECTOMY WITH EXTENDED RESECTION OF DISTAL ILEUM, Laparoscopic lysis of adhesions for 20-minute by Dr. Tanda 11/21   -WBC up to 16 yesterday.  Will order again today and if up further, will recommend a CT of the Abd/pelvis to r/o intra-abdominal abscess  Vicenta Poli 06/19/2024

## 2024-06-19 NOTE — TOC Progression Note (Signed)
 Transition of Care [facility]) - Progression Note    Patient Details  Name: Brooke [name] MRN: [id number] Date of Birth: 12/02/1950  Transition of Care [facility]) CM/SW Contact  Sonda Manuella Quill, RN Phone Number: 06/19/2024, 4:53 PM  Clinical Narrative:    TPN d/c'd; advancing diet; not ready for d/c; IP CM is following.     Barriers to Discharge: Continued Medical Work up               Expected Discharge Plan and Services                                               Social Drivers of Health (SDOH) Interventions SDOH Screenings   Food Insecurity: No Food Insecurity (05/18/2024)  Housing: Unknown (05/18/2024)  Transportation Needs: No Transportation Needs (05/18/2024)  Utilities: Not At Risk (05/18/2024)  Alcohol Screen: Low Risk  (09/24/2023)  Depression (PHQ2-9): Low Risk  (06/02/2024)  Financial Resource Strain: Low Risk  (09/24/2023)  Physical Activity: Insufficiently Active (09/24/2023)  Social Connections: Moderately Isolated (05/13/2024)  Stress: No Stress Concern Present (09/24/2023)  Tobacco Use: Low Risk  (06/12/2024)  Health Literacy: Adequate Health Literacy (09/24/2023)    Readmission Risk Interventions    05/14/2024   12:41 PM 05/05/2024   11:28 AM 04/16/2024    9:45 AM  Readmission Risk Prevention Plan  Transportation Screening Complete Complete Complete  PCP or Specialist Appt within 3-5 Days   Complete  HRI or Home Care Consult  Complete Complete  Social Work Consult for Recovery Care Planning/Counseling  Complete Complete  Palliative Care Screening  Not Applicable Not Applicable  Medication Review Oceanographer) Complete Complete Complete  HRI or Home Care Consult Complete    SW Recovery Care/Counseling Consult Complete    Palliative Care Screening Not Applicable    Skilled Nursing Facility Not Applicable

## 2024-06-20 DIAGNOSIS — K6389 Other specified diseases of intestine: Secondary | ICD-10-CM | POA: Diagnosis not present

## 2024-06-20 DIAGNOSIS — K56699 Other intestinal obstruction unspecified as to partial versus complete obstruction: Secondary | ICD-10-CM | POA: Diagnosis not present

## 2024-06-20 DIAGNOSIS — F418 Other specified anxiety disorders: Secondary | ICD-10-CM | POA: Diagnosis not present

## 2024-06-20 DIAGNOSIS — E119 Type 2 diabetes mellitus without complications: Secondary | ICD-10-CM | POA: Diagnosis not present

## 2024-06-20 LAB — CBC WITH DIFFERENTIAL/PLATELET
Abs Immature Granulocytes: 0.23 K/uL — ABNORMAL HIGH (ref 0.00–0.07)
Basophils Absolute: 0 K/uL (ref 0.0–0.1)
Basophils Relative: 0 %
Eosinophils Absolute: 0 K/uL (ref 0.0–0.5)
Eosinophils Relative: 0 %
HCT: 22.9 % — ABNORMAL LOW (ref 36.0–46.0)
Hemoglobin: 7.3 g/dL — ABNORMAL LOW (ref 12.0–15.0)
Immature Granulocytes: 2 %
Lymphocytes Relative: 11 %
Lymphs Abs: 1.4 K/uL (ref 0.7–4.0)
MCH: 27.9 pg (ref 26.0–34.0)
MCHC: 31.9 g/dL (ref 30.0–36.0)
MCV: 87.4 fL (ref 80.0–100.0)
Monocytes Absolute: 0.9 K/uL (ref 0.1–1.0)
Monocytes Relative: 7 %
Neutro Abs: 10.7 K/uL — ABNORMAL HIGH (ref 1.7–7.7)
Neutrophils Relative %: 80 %
Platelets: 280 K/uL (ref 150–400)
RBC: 2.62 MIL/uL — ABNORMAL LOW (ref 3.87–5.11)
RDW: 16.2 % — ABNORMAL HIGH (ref 11.5–15.5)
WBC: 13.3 K/uL — ABNORMAL HIGH (ref 4.0–10.5)
nRBC: 0 % (ref 0.0–0.2)

## 2024-06-20 LAB — PHOSPHORUS: Phosphorus: 2.6 mg/dL (ref 2.5–4.6)

## 2024-06-20 LAB — PREPARE RBC (CROSSMATCH)

## 2024-06-20 LAB — BASIC METABOLIC PANEL WITH GFR
Anion gap: 8 (ref 5–15)
BUN: 16 mg/dL (ref 8–23)
CO2: 20 mmol/L — ABNORMAL LOW (ref 22–32)
Calcium: 7.5 mg/dL — ABNORMAL LOW (ref 8.9–10.3)
Chloride: 112 mmol/L — ABNORMAL HIGH (ref 98–111)
Creatinine, Ser: 1.11 mg/dL — ABNORMAL HIGH (ref 0.44–1.00)
GFR, Estimated: 53 mL/min — ABNORMAL LOW (ref >60)
Glucose, Bld: 87 mg/dL (ref 70–99)
Potassium: 3.5 mmol/L (ref 3.5–5.1)
Sodium: 140 mmol/L (ref 135–145)

## 2024-06-20 LAB — GLUCOSE, CAPILLARY
Glucose-Capillary: 107 mg/dL — ABNORMAL HIGH (ref 70–99)
Glucose-Capillary: 114 mg/dL — ABNORMAL HIGH (ref 70–99)
Glucose-Capillary: 117 mg/dL — ABNORMAL HIGH (ref 70–99)

## 2024-06-20 LAB — MAGNESIUM: Magnesium: 2.5 mg/dL — ABNORMAL HIGH (ref 1.7–2.4)

## 2024-06-20 MED ORDER — SODIUM CHLORIDE 0.9% IV SOLUTION: Freq: Once | INTRAVENOUS | Status: AC

## 2024-06-20 NOTE — Progress Notes (Signed)
 [facility]                                                                               Brooke [name], is a 73 y.o. female, DOB - [birth year], FMW:[id number] Admit date - 06/07/2024    Outpatient Primary MD for the patient is Norleen Lynwood ORN, MD  LOS - 13  days    Brief summary   Brooke [name] is a 73 y.o. female with past medical history significant for HTN, HLD, DM2, asthma, anxiety, hypothyroidism, CKD stage IIIb, GERD who presented to [facility] ED on 06/07/2024 from home with complaint of abdominal pain associated with nausea and vomiting.  Poor appetite.  Pain is localized to the epigastric region and rated as a 6/10, sharp and constant.  No radiation.  Food exacerbates symptoms.  Denies diarrhea, no fever/chills.  Denies any blood in her stool.  Further denies chest pain, no palpitations, no shortness of breath, no cough, no urinary symptoms.   Last colonoscopy 12/2019 with diverticulosis of the sigmoid colon, otherwise normal.  No family history of colon cancer.   In the ED, temperature 98.1 F, HR 78, RR 18, BP 152/82, SpO2 99% on room air.  WBC 8.5, hemoglobin 10.3, platelet count 322.  Sodium 141, potassium 4.1, chloride 109, CO2 22, glucose 108, BUN 13, creat 1.61.  Alkaline phosphatase 180.  Lipase less than 10.  AST 27, ALT 12, total bilirubin 0.3.  Procalcitonin 0.33.  Lactic acid 2.0.  Vitamin B12 1004 and 76.  Iron 39, TIBC 207, ferritin 142.  Urinalysis with negative leukocytes negative nitrite, rare bacteria, 0-5 WBCs.  CT abdomen/pelvis with contrast with findings of enlarged appendix measuring 1.3 cm in diameter with enhancement and mild adjacent free fluid in the right lower quadrant concerning for mild acute appendicitis, focal circumferential narrowing with slight associated increased soft tissue density/enhancement over the ascending colon in the region of the hepatic flexure concerning for a focal mass/neoplasm, wall thickening of a moderate segment of distal ileum  may be due to small bowel enteritis versus infectious versus inflammatory in nature, mild free peritoneal fluid, several liver cyst unchanged, 2.8 cm hyperdense mass over the upper pole left kidney unchanged compatible with slightly hyperdense cyst, aortic atherosclerosis.  General surgery was consulted.  TRH consulted for admission for further evaluation and management.   Assessment & Plan   Nausea vomiting abdominal pain on admission CT of the abdomen and pelvis showing enlarged appendix of 1.3 cm in diameter, focal circumferential narrowing with slightly associated increased soft tissue density/enhancement over the ascending colon in the region of hepatic flexure concerning for focal mass/neoplasm Colonoscopy on 11/20 showed severe ulcerated stricture at the hepatic flexure concerning for malignancy. Ascending colon mass secondary to invasive adenocarcinoma, moderately differentiated Status post laparoscopic-assisted right colectomy with extended resection of distal ileum and lysis of additions by Dr. Tanda on 11/21. Oncology consulted Patient started on diet able to tolerate liquid diet without any issues advance to soft diet today. Patient is tolerating diet without any issues. D/c TPN as per gen surgery. She is able to tolerate without any issues.  She denies any nausea or vomiting.     Hypokalemia,  hypophosphatemia and hypomagnesemia Replaced  Repeat levels show potassium of 3.1 and mag of 1.5. replaced today.  Repeat levels in am.     Acute postop anemia Status status post 3 units of PRBC transfusion Hemoglobin continues to trickle down posttransfusion , from 8 to 7.8 to 7.3 . She reports feeling weak and tired .  1 unit of prbc transfusion ordered. Repeat H&H in am.      Leukocytosis Wbc count on 11/27 is 16,600 improved to 13,500 to 13,300.  No fever or chills.  The incisions look C/D/I.  She denies any worsening abdominal pain.     Hypertension Blood pressure  parameters are well controlled.  Added hydralazine  as needed to Cardizem  and metoprolol    Hyperlipidemia Patient on Crestor  continue the same.   Type 2 diabetes mellitus, diet controlled Hemoglobin A1c at 5.4 Continue sliding scale insulin  CBG (last 3)  Recent Labs    06/19/24 2356 06/20/24 0555 06/20/24 1159  GLUCAP 127* 107* 114*   No change in meds.     Stage IIIb CKD Creatinine appears to be stable.    History of depression and anxiety Resume Celexa    Body mass index is 36.16 kg/m. Obesity class I   GERD Stable   RN Pressure Injury Documentation:    Malnutrition Type:  Nutrition Problem: Inadequate oral intake Etiology: nausea, vomiting   Malnutrition Characteristics:  Signs/Symptoms: per patient/family report   Nutrition Interventions:  Interventions: TPN  Estimated body mass index is 36.16 kg/m as calculated from the following:   Height as of this encounter: 5' 3 (1.6 m).   Weight as of this encounter: 92.6 kg.  Code Status: Full code DVT Prophylaxis:  SCDs Start: 06/07/24 2000 Place TED hose Start: 06/07/24 2000   Level of Care: Level of care: Progressive Family Communication: None at bedside  Disposition Plan:     Remains inpatient appropriate: Pending clinical improvement,  Procedures:  Status post laparoscopic-assisted right colectomy with extended resection of distal ileum Laparoscopic lysis of adhesions by Dr. Tanda on 11/21  Consultants:   General Surgery Oncology  Antimicrobials:   Anti-infectives (From admission, onward)    Start     Dose/Rate Route Frequency Ordered Stop   06/12/24 1000  cefoTEtan  (CEFOTAN ) 2 g in sodium chloride  0.9 % 100 mL IVPB        2 g 200 mL/hr over 30 Minutes Intravenous On call to O.R. 06/12/24 0948 06/12/24 1315        Medications  Scheduled Meds:  allopurinol   100 mg Oral Daily   atropine   1 drop Left Eye Daily   azelastine   2 spray Each Nare BID   brimonidine   1 drop Left  Eye BID   budesonide -glycopyrrolate -formoterol   2 puff Inhalation BID   Chlorhexidine  Gluconate Cloth  6 each Topical Daily   citalopram   40 mg Oral Daily   diltiazem   180 mg Oral Daily   erythromycin    Left Eye QHS   feeding supplement  1 Container Oral TID BM   insulin  aspart  0-9 Units Subcutaneous Q6H   latanoprost   1 drop Left Eye QHS   loratadine   10 mg Oral Daily   metoprolol  succinate  100 mg Oral Daily   pantoprazole   40 mg Oral Daily   rosuvastatin   40 mg Oral Daily   sodium bicarbonate   1,300 mg Oral Daily   sodium chloride  flush  10-40 mL Intracatheter Q12H   Continuous Infusions:    PRN Meds:.hydrALAZINE , methocarbamol  (ROBAXIN ) injection, metoprolol  tartrate, morphine   injection, ondansetron  **OR** ondansetron  (ZOFRAN ) IV, mouth rinse, sodium chloride  flush, sodium chloride  flush, traZODone     Subjective:   Reyonna Card was seen and examined today.  Feeling weak and tired. No nausea, vomiting or abdominal pain.     Objective:   Vitals:   06/20/24 1008 06/20/24 1400 06/20/24 1429 06/20/24 1503  BP: (!) 159/68 (!) 147/61 139/72 (!) 143/78  Pulse: 96 91 91 92  Resp:  18 20 19   Temp:  98.9 F (37.2 C) 99.2 F (37.3 C) 99.5 F (37.5 C)  TempSrc:  Oral Oral Oral  SpO2:  99% 98% 99%  Weight:      Height:        Intake/Output Summary (Last 24 hours) at 06/20/2024 1627 Last data filed at 06/20/2024 1100 Gross per 24 hour  Intake 360 ml  Output --  Net 360 ml    Filed Weights   06/18/24 0504 06/19/24 0413 06/20/24 0500  Weight: 89.3 kg 89.3 kg 92.6 kg     Exam General exam: Appears calm and comfortable  Respiratory system: Clear to auscultation. Respiratory effort normal. Cardiovascular system: S1 & S2 heard, RRR.  Gastrointestinal system: Abdomen is soft bs+ Central nervous system: Alert and oriented. No focal neurological deficits. Extremities: Symmetric 5 x 5 power. Skin: No rashes Psychiatry: Mood & affect appropriate.       Data  Reviewed:  I have personally reviewed following labs and imaging studies   CBC Lab Results  Component Value Date   WBC 13.3 (H) 06/20/2024   RBC 2.62 (L) 06/20/2024   HGB 7.3 (L) 06/20/2024   HCT 22.9 (L) 06/20/2024   MCV 87.4 06/20/2024   MCH 27.9 06/20/2024   PLT 280 06/20/2024   MCHC 31.9 06/20/2024   RDW 16.2 (H) 06/20/2024   LYMPHSABS 1.4 06/20/2024   MONOABS 0.9 06/20/2024   EOSABS 0.0 06/20/2024   BASOSABS 0.0 06/20/2024     Last metabolic panel Lab Results  Component Value Date   NA 140 06/20/2024   K 3.5 06/20/2024   CL 112 (H) 06/20/2024   CO2 20 (L) 06/20/2024   BUN 16 06/20/2024   CREATININE 1.11 (H) 06/20/2024   GLUCOSE 87 06/20/2024   GFRNONAA 53 (L) 06/20/2024   GFRAA 39 (L) 09/24/2018   CALCIUM  7.5 (L) 06/20/2024   PHOS 2.6 06/20/2024   PROT 4.8 (L) 06/18/2024   ALBUMIN  2.0 (L) 06/18/2024   LABGLOB 2.3 06/01/2024   AGRATIO 1.7 10/27/2021   BILITOT 0.3 06/18/2024   ALKPHOS 62 06/18/2024   AST 48 (H) 06/18/2024   ALT 31 06/18/2024   ANIONGAP 8 06/20/2024    CBG (last 3)  Recent Labs    06/19/24 2356 06/20/24 0555 06/20/24 1159  GLUCAP 127* 107* 114*      Coagulation Profile: No results for input(s): INR, PROTIME in the last 168 hours.   Radiology Studies: No results found.     Elgie Butter M.D. [facility] 06/20/2024, 4:27 PM  Available via Epic secure chat 7am-7pm After 7 pm, please refer to night coverage provider listed on amion.

## 2024-06-20 NOTE — Progress Notes (Signed)
 8 Days Post-Op   Subjective/Chief Complaint: Pt complains of some incisional pain Tolerating po and having bm's Ambulating well with PT   Objective: Vital signs in last 24 hours: Temp:  [97.6 F (36.4 C)-99.3 F (37.4 C)] 98.7 F (37.1 C) (11/29 0512) Pulse Rate:  [92-94] 92 (11/29 0512) Resp:  [16] 16 (11/29 0512) BP: (131-149)/(63-87) 149/63 (11/29 0512) SpO2:  [97 %-98 %] 97 % (11/29 0812) Weight:  [92.6 kg] 92.6 kg (11/29 0500) Last BM Date : 06/18/24  Intake/Output from previous day: 11/28 0701 - 11/29 0700 In: 65.7 [IV Piggyback:65.7] Out: -  Intake/Output this shift: No intake/output data recorded.  Exam: Awake and alert, eating breakfast Comfortable Abdomen soft, incision clean, mildly tender  Lab Results:  Recent Labs    06/19/24 1220 06/20/24 0339  WBC 13.5* 13.3*  HGB 7.8* 7.3*  HCT 24.9* 22.9*  PLT 243 280   BMET Recent Labs    06/19/24 1220 06/20/24 0339  NA 140 140  K 3.1* 3.5  CL 110 112*  CO2 20* 20*  GLUCOSE 173* 87  BUN 18 16  CREATININE 1.06* 1.11*  CALCIUM  7.9* 7.5*   PT/INR No results for input(s): LABPROT, INR in the last 72 hours. ABG No results for input(s): PHART, HCO3 in the last 72 hours.  Invalid input(s): PCO2, PO2  Studies/Results: No results found.  Anti-infectives: Anti-infectives (From admission, onward)    Start     Dose/Rate Route Frequency Ordered Stop   06/12/24 1000  cefoTEtan  (CEFOTAN ) 2 g in sodium chloride  0.9 % 100 mL IVPB        2 g 200 mL/hr over 30 Minutes Intravenous On call to O.R. 06/12/24 0948 06/12/24 1315       Assessment/Plan: POD 8 : S/P LAPAROSCOPIC ASSISTED  RIGHT COLECTOMY WITH EXTENDED RESECTION OF DISTAL ILEUM, Laparoscopic lysis of adhesions for 20-minute by Dr. Tanda 11/21   -WBC trending down off antibiotics so will hold on a CT scan -continuing current care -she feels like she is a little to weak to go home [employer] 06/20/2024

## 2024-06-20 NOTE — Progress Notes (Signed)
 Physical Therapy Treatment Patient Details Name: Brooke [name] MRN: [id number] DOB: May 19, 1951 Today's Date: 06/20/2024   History of Present Illness Brooke [name] is a 73 y.o. female who presented to [facility] ED on 06/07/2024 from home with complaint of abdominal pain associated with nausea and vomiting; s/p  LAPAROSCOPIC ASSISTED  RIGHT COLECTOMY WITH EXTENDED RESECTION OF DISTAL ILEUM 11/21. PMH: HTN, HLD, DM2, asthma, anxiety, hypothyroidism, CKD stage IIIb, GERD    PT Comments  Pt agreeable to working with therapy. She reports feeling weak on today. RN reports she is to get a transfusion at some point today.  O2 97%, HR 101 bpm, dyspnea with exertion. Assisted pt back to recliner at her request. Will continue to follow.    If plan is discharge home, recommend the following: A little help with bathing/dressing/bathroom;Help with stairs or ramp for entrance;Assist for transportation;Assistance with cooking/housework;A little help with walking and/or transfers   Can travel by private vehicle        Equipment Recommendations  None recommended by PT    Recommendations for Other Services       Precautions / Restrictions Precautions Precautions: Fall Precaution/Restrictions Comments: recent ABD surgery Restrictions Weight Bearing Restrictions Per Provider Order: No     Mobility  Bed Mobility               General bed mobility comments: OOB in recliner    Transfers Overall transfer level: Needs assistance Equipment used: Rolling walker (2 wheels) Transfers: Sit to/from Stand Sit to Stand: Supervision           General transfer comment: Supv for safety.    Ambulation/Gait Ambulation/Gait assistance: Contact guard assist Gait Distance (Feet): 70 Feet Assistive device: Rolling walker (2 wheels) Gait Pattern/deviations: Step-through pattern, Decreased stride length       General Gait Details: some dyspnea with ambulation. pt reported fatigue, weakness. Task was  effortful.   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 Following commands: Intact      Cueing Cueing Techniques: Verbal cues  Exercises      General Comments        Pertinent Vitals/Pain Pain Assessment Pain Assessment: Faces Faces Pain Scale: Hurts little more (pt would not rate when asked) Pain Location: abdomen Pain Descriptors / Indicators: Operative site guarding Pain Intervention(s): Limited activity within patient's tolerance, Monitored during session, Repositioned    Home Living                          Prior Function            PT Goals (  current goals can now be found in the care plan section) Progress towards PT goals: Progressing toward goals    Frequency    Min 2X/week      PT Plan      Co-evaluation              AM-PAC PT 6 Clicks Mobility   Outcome Measure  Help needed turning from your back to your side while in a flat bed without using bedrails?: A Little Help needed moving from lying on your back to sitting on the side of a flat bed without using bedrails?: A Little Help needed moving to and from a bed to a chair (including a wheelchair)?: A Little Help needed standing up from a chair using your arms (e.g., wheelchair or bedside chair)?: A Little Help needed to walk  in hospital room?: A Little Help needed climbing 3-5 steps with a railing? : A Lot 6 Click Score: 17    End of Session   Activity Tolerance: Patient limited by fatigue;Patient tolerated treatment well Patient left: in chair;with call bell/phone within reach;with family/visitor present   PT Visit Diagnosis: Muscle weakness (generalized) (M62.81);Difficulty in walking, not elsewhere classified (R26.2);Pain     Time: 8840-8792 PT Time Calculation (min) (ACUTE ONLY): 8 min  Charges:    $Gait Training: 8-22 mins PT General Charges $$ ACUTE PT VISIT: 1 Visit                         Dannial SQUIBB, PT Acute Rehabilitation  Office: [phone]

## 2024-06-21 ENCOUNTER — Other Ambulatory Visit (HOSPITAL_COMMUNITY): Payer: Self-pay

## 2024-06-21 DIAGNOSIS — F418 Other specified anxiety disorders: Secondary | ICD-10-CM | POA: Diagnosis not present

## 2024-06-21 DIAGNOSIS — I10 Essential (primary) hypertension: Secondary | ICD-10-CM | POA: Diagnosis not present

## 2024-06-21 DIAGNOSIS — C182 Malignant neoplasm of ascending colon: Secondary | ICD-10-CM | POA: Diagnosis not present

## 2024-06-21 DIAGNOSIS — E119 Type 2 diabetes mellitus without complications: Secondary | ICD-10-CM | POA: Diagnosis not present

## 2024-06-21 LAB — CBC WITH DIFFERENTIAL/PLATELET
Abs Immature Granulocytes: 0.11 K/uL — ABNORMAL HIGH (ref 0.00–0.07)
Basophils Absolute: 0 K/uL (ref 0.0–0.1)
Basophils Relative: 0 %
Eosinophils Absolute: 0 K/uL (ref 0.0–0.5)
Eosinophils Relative: 0 %
HCT: 27.7 % — ABNORMAL LOW (ref 36.0–46.0)
Hemoglobin: 9 g/dL — ABNORMAL LOW (ref 12.0–15.0)
Immature Granulocytes: 1 %
Lymphocytes Relative: 12 %
Lymphs Abs: 1.4 K/uL (ref 0.7–4.0)
MCH: 28.5 pg (ref 26.0–34.0)
MCHC: 32.5 g/dL (ref 30.0–36.0)
MCV: 87.7 fL (ref 80.0–100.0)
Monocytes Absolute: 0.8 K/uL (ref 0.1–1.0)
Monocytes Relative: 7 %
Neutro Abs: 9.5 K/uL — ABNORMAL HIGH (ref 1.7–7.7)
Neutrophils Relative %: 80 %
Platelets: 314 K/uL (ref 150–400)
RBC: 3.16 MIL/uL — ABNORMAL LOW (ref 3.87–5.11)
RDW: 15.9 % — ABNORMAL HIGH (ref 11.5–15.5)
WBC: 11.9 K/uL — ABNORMAL HIGH (ref 4.0–10.5)
nRBC: 0 % (ref 0.0–0.2)

## 2024-06-21 LAB — BASIC METABOLIC PANEL WITH GFR
Anion gap: 9 (ref 5–15)
BUN: 12 mg/dL (ref 8–23)
CO2: 20 mmol/L — ABNORMAL LOW (ref 22–32)
Calcium: 8 mg/dL — ABNORMAL LOW (ref 8.9–10.3)
Chloride: 111 mmol/L (ref 98–111)
Creatinine, Ser: 1.06 mg/dL — ABNORMAL HIGH (ref 0.44–1.00)
GFR, Estimated: 56 mL/min — ABNORMAL LOW (ref >60)
Glucose, Bld: 99 mg/dL (ref 70–99)
Potassium: 3.6 mmol/L (ref 3.5–5.1)
Sodium: 140 mmol/L (ref 135–145)

## 2024-06-21 LAB — PHOSPHORUS: Phosphorus: 2.5 mg/dL (ref 2.5–4.6)

## 2024-06-21 LAB — GLUCOSE, CAPILLARY
Glucose-Capillary: 108 mg/dL — ABNORMAL HIGH (ref 70–99)
Glucose-Capillary: 150 mg/dL — ABNORMAL HIGH (ref 70–99)
Glucose-Capillary: 97 mg/dL (ref 70–99)

## 2024-06-21 LAB — MAGNESIUM: Magnesium: 2.4 mg/dL (ref 1.7–2.4)

## 2024-06-21 MED ORDER — METOPROLOL SUCCINATE ER 100 MG PO TB24
100.0000 mg | ORAL_TABLET | Freq: Every day | ORAL | 0 refills | Status: AC
  Filled 2024-06-21: qty 30, 30d supply, fill #0

## 2024-06-21 MED ORDER — PANTOPRAZOLE SODIUM 40 MG PO TBEC
40.0000 mg | DELAYED_RELEASE_TABLET | Freq: Every day | ORAL | 0 refills | Status: DC
  Filled 2024-06-21: qty 30, 30d supply, fill #0

## 2024-06-21 NOTE — Progress Notes (Signed)
Patient discharged: Home with family  Via: Wheelchair   Discharge paperwork given: to patient and family  Reviewed with teach back  PICC and telemetry disconnected  Belongings given to patient   

## 2024-06-21 NOTE — Discharge Summary (Signed)
 Physician Discharge Summary   Patient: Brooke [name] MRN: [id number] DOB: September 13, 1950  Admit date:     06/07/2024  Discharge date: 06/21/24  Discharge Physician: Elgie Butter   PCP: Norleen Lynwood ORN, MD   Recommendations at discharge:   Please follow up with general surgery for removal of staples Please follow up with cbc and bmp in one week.  Please follow up with PCP in one week.   Discharge Diagnoses: Principal Problem:   Cancer of ascending colon pT3pN0 s/p right colectomy 06/12/2024 Active Problems:   Appendix disease   DM2 (diabetes mellitus, type 2) (HCC)   Hyperlipidemia   Gout   Normochromic normocytic anemia   Essential hypertension   Asthma   GERD (gastroesophageal reflux disease)   Depression with anxiety   CKD stage 3b, GFR 30-44 ml/min (HCC)   Colonic stricture (HCC)   Ileitis   Benign neoplasm of descending colon  Resolved Problems:   * No resolved hospital problems. *  Hospital Course:    Brooke [name] is a 73 y.o. female with past medical history significant for HTN, HLD, DM2, asthma, anxiety, hypothyroidism, CKD stage IIIb, GERD who presented to [facility] ED on 06/07/2024 from home with complaint of abdominal pain associated with nausea and vomiting.  Poor appetite.  Pain is localized to the epigastric region and rated as a 6/10, sharp and constant.  No radiation.  Food exacerbates symptoms.  Denies diarrhea, no fever/chills.  Denies any blood in her stool.  Further denies chest pain, no palpitations, no shortness of breath, no cough, no urinary symptoms.   Last colonoscopy 12/2019 with diverticulosis of the sigmoid colon, otherwise normal.  No family history of colon cancer.   In the ED, temperature 98.1 F, HR 78, RR 18, BP 152/82, SpO2 99% on room air.  WBC 8.5, hemoglobin 10.3, platelet count 322.  Sodium 141, potassium 4.1, chloride 109, CO2 22, glucose 108, BUN 13, creat 1.61.  Alkaline phosphatase 180.  Lipase less than 10.  AST 27, ALT 12, total bilirubin  0.3.  Procalcitonin 0.33.  Lactic acid 2.0.  Vitamin B12 1004 and 76.  Iron 39, TIBC 207, ferritin 142.  Urinalysis with negative leukocytes negative nitrite, rare bacteria, 0-5 WBCs.  CT abdomen/pelvis with contrast with findings of enlarged appendix measuring 1.3 cm in diameter with enhancement and mild adjacent free fluid in the right lower quadrant concerning for mild acute appendicitis, focal circumferential narrowing with slight associated increased soft tissue density/enhancement over the ascending colon in the region of the hepatic flexure concerning for a focal mass/neoplasm, wall thickening of a moderate segment of distal ileum may be due to small bowel enteritis versus infectious versus inflammatory in nature, mild free peritoneal fluid, several liver cyst unchanged, 2.8 cm hyperdense mass over the upper pole left kidney unchanged compatible with slightly hyperdense cyst, aortic atherosclerosis.  General surgery was consulted.  TRH consulted for admission for further evaluation and management.     Assessment and Plan:    Nausea vomiting abdominal pain on admission CT of the abdomen and pelvis showing enlarged appendix of 1.3 cm in diameter, focal circumferential narrowing with slightly associated increased soft tissue density/enhancement over the ascending colon in the region of hepatic flexure concerning for focal mass/neoplasm Colonoscopy on 11/20 showed severe ulcerated stricture at the hepatic flexure concerning for malignancy. Ascending colon mass secondary to invasive adenocarcinoma, moderately differentiated Status post laparoscopic-assisted right colectomy with extended resection of distal ileum and lysis of additions by Dr. Tanda on 11/21.  Oncology consulted Patient started on diet able to tolerate liquid diet without any issues advance to soft diet today. Patient is tolerating diet without any issues. D/c TPN as per gen surgery. She is able to tolerate without any issues.  She  denies any nausea or vomiting.        Hypokalemia, hypophosphatemia and hypomagnesemia Replaced  Repeat level wnl.        Acute postop anemia Status status post 3 units of PRBC transfusion Hemoglobin continues to trickle down posttransfusion , from 8 to 7.8 to 7.3 . She reports feeling weak and tired .  1 unit of prbc transfusion ordered. Repeat H&H is around 9           Leukocytosis Wbc count on 11/27 is 16,600 improved to 13,500 to 13,300.  No fever or chills.  The incisions look C/D/I.  She denies any worsening abdominal pain.        Hypertension Blood pressure parameters are well controlled.  Added hydralazine  as needed to Cardizem  and metoprolol      Hyperlipidemia Patient on Crestor  continue the same.     Type 2 diabetes mellitus, diet controlled Hemoglobin A1c at 5.4 Resume home meds.      Stage IIIb CKD Creatinine appears to be stable.      History of depression and anxiety Resume Celexa      Body mass index is 36.16 kg/m. Obesity class I     GERD Stable     RN Pressure Injury Documentation:   Malnutrition Type:   Nutrition Problem: Inadequate oral intake Etiology: nausea, vomiting     Malnutrition Characteristics:   Signs/Symptoms: per patient/family report     Nutrition Interventions:   Interventions: TPN   Estimated body mass index is 36.16 kg/m as calculated from the following:   Height as of this encounter: 5' 3 (1.6 m).   Weight as of this encounter: 92.6 kg.         Consultants: general surgery. Oncology.  Procedures performed:  Status post laparoscopic-assisted right colectomy with extended resection of distal ileum and lysis of additions by Dr. Tanda on 11/21. Disposition: Home Diet recommendation:  Discharge Diet Orders (From admission, onward)     Start     Ordered   06/21/24 0000  Diet - low sodium heart healthy        06/21/24 1254           Regular diet DISCHARGE MEDICATION: Allergies as of  06/21/2024       Reactions   Gnp Glp-1 Daily Support [germanium] Other (See Comments)   Ileus requiring hospitalization   Metformin  And Related Nausea And Vomiting   Ace Inhibitors Swelling, Other (See Comments)   Ankles swell   Codeine Nausea And Vomiting, Rash   Hydrocodone  Nausea And Vomiting   Tramadol  Nausea And Vomiting   Tylenol  [acetaminophen ] Itching, Rash, Other (See Comments)   Allergic to prescription strength Tylenol . The patient is taking 500 mg Tylenol  in 2025, however.        Medication List     STOP taking these medications    loperamide  2 MG capsule Commonly known as: IMODIUM    potassium chloride  SA 20 MEQ tablet Commonly known as: KLOR-CON  M       TAKE these medications    albuterol  108 (90 Base) MCG/ACT inhaler Commonly known as: VENTOLIN  HFA Inhale 2 puffs into the lungs every 4 (four) hours as needed for wheezing or shortness of breath.   allopurinol  100 MG tablet Commonly known  as: ZYLOPRIM  TAKE 1 TABLET BY MOUTH EVERY DAY   atropine  1 % ophthalmic solution Place 1 drop into the left eye daily.   Azelastine  HCl 137 MCG/SPRAY Soln PLACE 2 SPRAYS INTO BOTH NOSTRILS 2 (TWO) TIMES DAILY. USE IN EACH NOSTRIL AS DIRECTED What changed: See the new instructions.   brimonidine  0.2 % ophthalmic solution Commonly known as: ALPHAGAN  3 (three) times daily. 1 drop in the left eye three times daily What changed:  how much to take how to take this when to take this additional instructions   cetirizine  10 MG tablet Commonly known as: ZYRTEC  TAKE 1 TABLET BY MOUTH EVERY DAY   citalopram  40 MG tablet Commonly known as: CELEXA  TAKE 1 TABLET BY MOUTH EVERY DAY   clonazePAM  0.5 MG tablet Commonly known as: KLONOPIN  Take 1 tablet (0.5 mg total) by mouth daily as needed (vertigo).   colchicine  0.6 MG tablet TAKE 0.5 TABLETS (0.3 MG TOTAL) BY MOUTH DAILY AS NEEDED (GOUT OR PSUEDOGOUT PAIN).   diltiazem  180 MG 24 hr capsule Commonly known as:  CARDIZEM  CD Take 1 capsule (180 mg total) by mouth daily.   erythromycin  ophthalmic ointment Place 1 Application into the left eye at bedtime.   famotidine  20 MG tablet Commonly known as: Pepcid  Take 1 tablet (20 mg total) by mouth 2 (two) times daily.   latanoprost  0.005 % ophthalmic solution Commonly known as: XALATAN  Place 1 drop into the left eye at bedtime.   levocetirizine 5 MG tablet Commonly known as: XYZAL  Take 1 tablet (5 mg total) by mouth daily.   meclizine  25 MG tablet Commonly known as: ANTIVERT  TAKE 1 TABLET BY MOUTH 3 TIMES A DAY AS NEEDED FOR DIZZINESS What changed: See the new instructions.   metoprolol  succinate 100 MG 24 hr tablet Commonly known as: TOPROL -XL Take 1 tablet (100 mg total) by mouth daily. Take with or immediately following a meal. Start taking on: June 22, 2024 What changed: See the new instructions.   ondansetron  4 MG disintegrating tablet Commonly known as: ZOFRAN -ODT Take 1 tablet (4 mg total) by mouth every 8 (eight) hours as needed for nausea or vomiting. What changed: reasons to take this   pantoprazole  40 MG tablet Commonly known as: PROTONIX  Take 1 tablet (40 mg total) by mouth daily. Start taking on: June 22, 2024   potassium chloride  10 MEQ tablet Commonly known as: Klor-Con  10 Take 1 tablet (10 mEq total) by mouth daily for 5 days.   rosuvastatin  40 MG tablet Commonly known as: CRESTOR  TAKE 1 TABLET BY MOUTH EVERY DAY   sodium bicarbonate  650 MG tablet Take 2 tablets (1,300 mg total) by mouth 2 (two) times daily.   traZODone  100 MG tablet Commonly known as: DESYREL  TAKE 1 TABLET (100 MG TOTAL) BY MOUTH AT BEDTIME AS NEEDED. FOR SLEEP   Trelegy Ellipta  200-62.5-25 MCG/ACT Aepb Generic drug: Fluticasone -Umeclidin-Vilant Inhale 1 puff into the lungs daily.   TYLENOL  500 MG tablet Generic drug: acetaminophen  Take 1,000 mg by mouth every 8 (eight) hours as needed for mild pain (pain score 1-3) (or headaches).         Follow-up Information     Tanda Locus, MD. Go on 07/03/2024.   Specialty: General Surgery Why: At 2:30PM, Arrive 30 mins prior to scheduled appointment time, Please bring your insurance cards and ID. Contact information: [address] [place] [address] [phone]         [facility], [place]. Go on 06/24/2024.  Specialty: General Surgery Why: At 9:00AM, This is a nurse visit only to have your staples removed. Arrive 30 mins prior to scheduled appointment time. Please bring your insurance cards and ID. Contact information: [address] [place]  [address] [phone]        [facility]) Follow up.   Specialty: Home Health Services Why: Please continue Home Health PT services with this provider after discharge. Contact information: [address] [facility] [place]  [address] [phone]               Discharge Exam: Filed Weights   06/18/24 0504 06/19/24 0413 06/20/24 0500  Weight: 89.3 kg 89.3 kg 92.6 kg   General exam: Appears calm and comfortable  Respiratory system: Clear to auscultation. Respiratory effort normal. Cardiovascular system: S1 & S2 heard, RRR. No JVD Gastrointestinal system: Abdomen is nondistended, soft and nontender.  Central nervous system: Alert and oriented.  Extremities: Symmetric 5 x 5 power. Skin: No rashes,  Psychiatry:  Mood & affect appropriate.    Condition at discharge: fair  The results of significant diagnostics from this hospitalization (including imaging, microbiology, ancillary and laboratory) are listed below for reference.   Imaging Studies: US  EKG SITE RITE Result Date: 06/09/2024 If Site Rite image not attached, placement could not be confirmed due to current cardiac rhythm.  CT ABDOMEN PELVIS W CONTRAST Result Date: 06/07/2024 CLINICAL DATA:  Possible bowel obstruction. Abdominal pain beginning yesterday.  EXAM: CT ABDOMEN AND PELVIS WITH CONTRAST TECHNIQUE: Multidetector CT imaging of the abdomen and pelvis was performed using the standard protocol following bolus administration of intravenous contrast. RADIATION DOSE REDUCTION: This exam was performed according to the departmental dose-optimization program which includes automated exposure control, adjustment of the mA and/or kV according to patient size and/or use of iterative reconstruction technique. CONTRAST:  80mL OMNIPAQUE  IOHEXOL  300 MG/ML  SOLN COMPARISON:  CT 04/28/2024, MRI abdomen 01/07/2022 FINDINGS: Lower chest: Heart is normal size. Minimal calcified plaque over the descending thoracic aorta. Visualized lung bases are normal for a tiny amount of right pleural fluid. Hepatobiliary: Previous cholecystectomy. There are several liver cysts unchanged. Biliary tree is normal. Pancreas: Fatty atrophy of the pancreas which is otherwise normal. Spleen: Normal. Adrenals/Urinary Tract: Adrenal glands are normal. Kidneys are normal in size. No focal mass or hydronephrosis. No nephrolithiasis. 2.8 cm hypodense mass over the upper pole left kidney unchanged compatible with slightly hyperdense cyst. Ureters and bladder are normal. Stomach/Bowel: Possible small sliding hiatal hernia unchanged. Stomach is otherwise unremarkable. Small bowel is notable for wall thickening of a moderate segment of the distal ileum. There is focal circumferential narrowing with slight associated increased soft tissue density/enhancement involving the ascending colon in the region of the hepatic flexure. This is concerning for a focal mass/neoplasm. Just proximal to this narrowing, the cecum and ascending colon is fluid-filled and mildly dilated measuring 7.6 cm in diameter. Appendix is enlarged measuring 1.3 cm distally near the tip and demonstrates mucosal enhancement. There is a small amount of free fluid in the right lower quadrant as findings are concerning for early acute  appendicitis. Vascular/Lymphatic: Mild calcified plaque over the abdominal aorta which is normal in caliber. No adenopathy. Reproductive: Status post hysterectomy. No adnexal masses. Other: Mild free fluid over the perisplenic region and minimally over the right pericolic gutter in the right lower quadrant and in the pelvis. Musculoskeletal: No focal abnormality. IMPRESSION: 1. Enlarged appendix measuring 1.3 cm in diameter with mucosal enhancement and  mild adjacent free fluid in the right lower quadrant. Findings are concerning for mild acute appendicitis. 2. Focal circumferential narrowing with slight associated increased soft tissue density/enhancement over the ascending colon in the region of the hepatic flexure. This is concerning for a focal mass/neoplasm. Consider colonoscopy for further evaluation. 3. Wall thickening of a moderate segment of the distal ileum. Findings may be due to small bowel enteritis of infectious or inflammatory nature. 4. Mild free peritoneal fluid. 5. Several liver cysts unchanged. 6. 2.8 cm hypodense mass over the upper pole left kidney unchanged compatible with slightly hyperdense cyst. 7. Aortic atherosclerosis. Aortic Atherosclerosis (ICD10-I70.0). These results were called by telephone at the time of interpretation on 06/07/2024 at 5:05 pm to provider [facility] , who verbally acknowledged these results. Electronically Signed   By: Toribio Agreste M.D.   On: 06/07/2024 17:05    Microbiology: Results for orders placed or performed during the hospital encounter of 06/07/24  Surgical pcr screen     Status: Abnormal   Collection Time: 06/12/24 10:55 AM   Specimen: Nasal Mucosa; Nasal Swab  Result Value Ref Range Status   MRSA, PCR POSITIVE (A) NEGATIVE Final    Comment: RESULT CALLED TO, READ BACK BY AND VERIFIED WITH:  TANDA, E 06/12/2024 1257 AJ    Staphylococcus aureus POSITIVE (A) NEGATIVE Final    Comment: (NOTE) The Xpert SA Assay (FDA approved for NASAL  specimens in patients 44 years of age and older), is one component of a comprehensive surveillance program. It is not intended to diagnose infection nor to guide or monitor treatment. Performed at [facility], [address] [place], [place] [address]     Labs: CBC: Recent Labs  Lab 06/17/24 0153 06/18/24 9077 06/19/24 1220 06/20/24 0339 06/21/24 0312  WBC 11.6* 16.6* 13.5* 13.3* 11.9*  NEUTROABS  --  13.6* 11.3* 10.7* 9.5*  HGB 7.7* 8.0* 7.8* 7.3* 9.0*  HCT 24.3* 25.2* 24.9* 22.9* 27.7*  MCV 88.7 89.4 88.6 87.4 87.7  PLT 142* 185 243 280 314   Basic Metabolic Panel: Recent Labs  Lab 06/15/24 0249 06/16/24 0446 06/17/24 0153 06/18/24 0431 06/19/24 1220 06/20/24 0339 06/21/24 0312  NA 137 140 141 141 140 140 140  K 4.5 4.0 3.8 3.6 3.1* 3.5 3.6  CL 106 108 111 111 110 112* 111  CO2 25 24 24 22  20* 20* 20*  GLUCOSE 120* 125* 130* 123* 173* 87 99  BUN 22 19 18 19 18 16 12   CREATININE 1.14* 1.10* 1.06* 0.94 1.06* 1.11* 1.06*  CALCIUM  8.0* 8.0* 7.9* 8.0* 7.9* 7.5* 8.0*  MG 2.3 1.7  --  1.8 1.5* 2.5* 2.4  PHOS 2.3* 2.8  --  3.1  --  2.6 2.5   Liver Function Tests: Recent Labs  Lab 06/15/24 0249 06/18/24 0431  AST 18 48*  ALT 14 31  ALKPHOS 68 62  BILITOT 0.2 0.3  PROT 4.7* 4.8*  ALBUMIN  2.2* 2.0*   CBG: Recent Labs  Lab 06/20/24 1159 06/20/24 1748 06/21/24 0016 06/21/24 0629 06/21/24 1200  GLUCAP 114* 117* 108* 97 150*    Discharge time spent: 32 minutes.   Signed: Elgie Butter, MD [facility] 06/21/2024

## 2024-06-21 NOTE — Progress Notes (Signed)
 Pt awaiting on the daughter, ready for d/c

## 2024-06-21 NOTE — Progress Notes (Addendum)
 9 Days Post-Op   Subjective/Chief Complaint: Feeling better Ambulating, tolerating po, having bm's   Objective: Vital signs in last 24 hours: Temp:  [98.4 F (36.9 C)-99.8 F (37.7 C)] 98.4 F (36.9 C) (11/30 9367) Pulse Rate:  [87-96] 87 (11/30 0632) Resp:  [17-20] 19 (11/30 9367) BP: (139-160)/(61-78) 160/69 (11/30 9367) SpO2:  [97 %-99 %] 99 % (11/30 9367) Last BM Date : 06/20/24  Intake/Output from previous day: 11/29 0701 - 11/30 0700 In: 1070.3 [P.O.:600; I.V.:22.3; Blood:448] Out: -  Intake/Output this shift: No intake/output data recorded.  Exam: Awake and alert Abdomen soft, incision healing well, staples in place  Lab Results:  Recent Labs    06/20/24 0339 06/21/24 0312  WBC 13.3* 11.9*  HGB 7.3* 9.0*  HCT 22.9* 27.7*  PLT 280 314   BMET Recent Labs    06/20/24 0339 06/21/24 0312  NA 140 140  K 3.5 3.6  CL 112* 111  CO2 20* 20*  GLUCOSE 87 99  BUN 16 12  CREATININE 1.11* 1.06*  CALCIUM  7.5* 8.0*   PT/INR No results for input(s): LABPROT, INR in the last 72 hours. ABG No results for input(s): PHART, HCO3 in the last 72 hours.  Invalid input(s): PCO2, PO2  Studies/Results: No results found.  Anti-infectives: Anti-infectives (From admission, onward)    Start     Dose/Rate Route Frequency Ordered Stop   06/12/24 1000  cefoTEtan  (CEFOTAN ) 2 g in sodium chloride  0.9 % 100 mL IVPB        2 g 200 mL/hr over 30 Minutes Intravenous On call to O.R. 06/12/24 0948 06/12/24 1315       Assessment/Plan: POD 9 : S/P LAPAROSCOPIC ASSISTED  RIGHT COLECTOMY WITH EXTENDED RESECTION OF DISTAL ILEUM, Laparoscopic lysis of adhesions for 20-minute by Dr. Tanda 11/21   -doing well -WBC almost normal -from a surgery standpoint, she can be discharged home.  Appointment made for 12/3 for staple removal -we will see again as an outpt  Vicenta Poli 06/21/2024

## 2024-06-21 NOTE — TOC Transition Note (Addendum)
 Transition of Care [facility]) - Discharge Note   Patient Details  Name: Brooke [name] MRN: [id number] Date of Birth: 1951/03/19  Transition of Care [facility]) CM/SW Contact:  Heather DELENA Saltness, LCSW Phone Number: 06/21/2024, 1:15 PM   Clinical Narrative:    Pt discharging home today. Pt currently active with [facility] PT services. HH orders placed. No further TOC needs at this time.   Final next level of care: Home w Home Health Services Barriers to Discharge: Barriers Resolved   Patient Goals and CMS Choice Patient states their goals for this hospitalization and ongoing recovery are:: To return home      Discharge Placement  Home              Patient to be transferred to facility by: Family Name of family member notified: Patient Patient and family notified of of transfer: 06/21/24  Discharge Plan and Services Additional resources added to the After Visit Summary for  Follow Up                DME Arranged: N/A DME Agency: NA       HH Arranged: PT HH Agency: Other - See comment Producer, Television/film/video)        Social Drivers of Health (SDOH) Interventions SDOH Screenings   Food Insecurity: No Food Insecurity (05/18/2024)  Housing: Unknown (05/18/2024)  Transportation Needs: No Transportation Needs (05/18/2024)  Utilities: Not At Risk (05/18/2024)  Alcohol Screen: Low Risk  (09/24/2023)  Depression (PHQ2-9): Low Risk  (06/02/2024)  Financial Resource Strain: Low Risk  (09/24/2023)  Physical Activity: Insufficiently Active (09/24/2023)  Social Connections: Moderately Isolated (05/13/2024)  Stress: No Stress Concern Present (09/24/2023)  Tobacco Use: Low Risk  (06/12/2024)  Health Literacy: Adequate Health Literacy (09/24/2023)     Readmission Risk Interventions    05/14/2024   12:41 PM 05/05/2024   11:28 AM 04/16/2024    9:45 AM  Readmission Risk Prevention Plan  Transportation Screening Complete Complete Complete  PCP or Specialist Appt within 3-5 Days   Complete  HRI or  Home Care Consult  Complete Complete  Social Work Consult for Recovery Care Planning/Counseling  Complete Complete  Palliative Care Screening  Not Applicable Not Applicable  Medication Review Oceanographer) Complete Complete Complete  HRI or Home Care Consult Complete    SW Recovery Care/Counseling Consult Complete    Palliative Care Screening Not Applicable    Skilled Nursing Facility Not Applicable      Signed: Heather Saltness, MSW, LCSW Clinical Social Worker Inpatient Care Management 06/21/2024 1:16 PM

## 2024-06-22 ENCOUNTER — Telehealth: Payer: Self-pay | Admitting: *Deleted

## 2024-06-22 LAB — TYPE AND SCREEN
ABO/RH(D): O POS
Antibody Screen: NEGATIVE
Unit division: 0

## 2024-06-22 LAB — BPAM RBC
Blood Product Expiration Date: 202512282359
ISSUE DATE / TIME: 202511291356
Unit Type and Rh: 5100

## 2024-06-22 NOTE — Patient Instructions (Signed)
 Visit Information  Thank you for taking time to visit with me today. Please don't hesitate to contact me if I can be of assistance to you before our next scheduled telephone appointment.  Our next appointment is by telephone on Tuesday June 30, 2024 at 11:00 am  Please call the care guide team at [phone] if you need to cancel or reschedule your appointment.   Following is a copy of your care plan:   Goals Addressed             This Visit's Progress    [facility]I Transitions of Care (TOC) Care Plan   On track    Problems:  Recent Hospitalization for treatment of hypokalemia Home Health services barrier: has not received call from [facility].  Patient states she will call [facility] home health on tomorrow 05/19/24 if she does not hear from them.  Confirmed patient has contact phone number for [facility] 05/27/24: Confirmed home health PT is currently present in patient's home for initial PT visit: spoke with Renda: she reports she works with [facility] 06/22/24: confirmed [facility] to resume home health PT services as per inpatient TOC note Recent hospitalization October 21-26, 2025 for hypokalemia and hypomagnesemia; weakness Hospital re-admission November 16-30, 2025: colon CA with (R) surgical colectomy/ post-op anemia Essentially independent; resides with supportive adult son and daughter-in-law; uses [employer] at baseline for transportation- well established with [employer]; family assists with transportation as indicated; currently using walker for ambulation (did not use regularly prior to recent hospital admission) Fragile state of health, multiple progressing chronic health conditions (5) unplanned hospital admissions x last (6)/ (12) months  Goal:  Over the next 30 days, the patient will not experience hospital readmission  Interventions:  Transitions of Care: week # 1/ day # 1 Discussed current clinical condition: I am doing okay after this long hospital stay.  Glad to be home.  Dr. Norleen  needs to tell me whether or not I should be taking the potassium all the time, or not.  Also I need to know how often I am supposed to be taking the sodium bicarbonate --- I am taking them all like the hosp[ital told me to for now, but the doctors keep changing their minds about this and I want to know what I should be doing long term.  I am focusing on trying to heal from the latest surgery and will discuss all of this with Dr. Norleen when I go next week  Patient denies clinical concerns and sounds to be in no distress throughout [facility] 30-day program outreach call  Labs reviewed recent post-hospital discharge lab results for K= and Na+ levels and advised to continue taking supplements for both as instructed Durable Medical Equipment (DME) needs assessed with patient/caregiver Doctor Visits  - discussed the importance of doctor visits Communication with PCP re: successful re-enrollment into TOC 30-day program Arranged PCP follow-up within 12-14 days (Care Guide Scheduled) Post discharge activity limitations prescribed by provider reviewed Post-op wound/incision care reviewed with patient/caregiver Reviewed Signs and symptoms of infection Reviewed upcoming provider visits: Wednesday 06/24/24: surgical provider for staple removal: confirmed patient is aware of and has plans to attend as scheduled Fall precautions discussed- advised to use ambulatory device as recommended Re-provided contact information for home health PT team [facility]) to resume care and encouraged patient to contact them tomorrow morning to arrange resumption of care visit; role of home health services with importance of participation/ ongoing engagement  Patient Self Care Activities:  Attend all scheduled provider appointments Call  provider office for new concerns or questions  Participate in Transition of Care Program/Attend TOC scheduled calls Take medications as prescribed   Follow fall precautions: clear pathways, wear non skid  footwear, keep areas such as hallways/ bathrooms well lit. Use ambulatory device as recommended.  Consider eating potassium rich foods: bananas, oranges, potatoes, spinach Notify provider for ongoing nausea/ vomiting symptoms Call 911 for severe SOB/ chest pain Continue working with the home health team that is involved in your care Take all recorded blood pressures from home monitoring to your doctor appointments for review If you believe your condition is getting worse- contact your care providers (doctors) promptly- reaching out to your doctor early when you have concerns can prevent you from having to go to the hospital  Plan:  Telephone follow up appointment with care management team member scheduled for:  06/30/24 at 11:00 am        Care plan and visit instructions communicated with the patient verbally today. Patient agrees to receive a copy in MyChart. Active MyChart status and patient understanding of how to access instructions and care plan via MyChart confirmed with patient.     If you are experiencing a Mental Health or Behavioral Health Crisis or need someone to talk to, please  call the Suicide and Crisis Lifeline: 988 call the [place]  National Suicide Prevention Lifeline: (321)630-6791 or TTY: 314-779-8658 TTY (364)366-9875) to talk to a trained counselor call 1-800-273-TALK (toll free, 24 hour hotline) go to [facility] [address], [place] [phone]) call the [facility] Crisis Line: [phone] call 911   Beatris Blinda Lawrence, RN, BSN, CCRN Alumnus RN Care Manager  Transitions of Care  [facility] - Population Health  [place] [phone]: direct office

## 2024-06-22 NOTE — Transitions of Care (Post Inpatient/ED Visit) (Signed)
 06/22/2024  Name: Brooke [name] MRN: [id number] DOB: 1951/07/03  Today's TOC FU Call Status: Today's TOC FU Call Status:: Successful TOC FU Call Completed TOC FU Call Complete Date: 06/22/24  Patient's Name and Date of Birth confirmed. Name, DOB  Transition Care Management Follow-up Telephone Call Date of Discharge: 06/21/24 Discharge Facility: [facility]) Type of Discharge: Inpatient Admission Primary Inpatient Discharge Diagnosis:: Abdominal pain/ N-V; ascending colon CA- benign neoplasm: surgical (R) colectomy; post-op anemia How have you been since you were released from the hospital?: Better (I am fine, ready to get well.  Tired of all these hospital visits.  The surgery incision is sore.  I am only taking Tylenol , that is all they told me I could take.  Getting the stitches out on Wednesday.  Doing okay, as good as can be expected I guess) Any questions or concerns?: No  Items Reviewed: Did you receive and understand the discharge instructions provided?: Yes (thoroughly reviewed with patient who verbalizes good understanding of same) Medications obtained,verified, and reconciled?: Yes (Medications Reviewed) (Full medication reconciliation/ review completed; no concerns or discrepancies identified; confirmed patient obtained/ is taking all newly Rx'd medications as instructed; self-manages medications and denies questions/ concerns around medications today) Any new allergies since your discharge?: No Dietary orders reviewed?: Yes Type of Diet Ordered:: Regular diet Do you have support at home?: Yes People in Home [RPT]: child(ren), adult Name of Support/Comfort Primary Source: Reports essentially independent in self-care activities; resides with supportive son and daughter-inlaw:  assists as/ if needed/ indicated  Medications Reviewed Today: Medications Reviewed Today     Reviewed by Anwar Crill M, RN (Registered Nurse) on 06/22/24 at 1620  Med List Status: <None>    Medication Order Taking? Sig Documenting Provider Last Dose Status Informant  albuterol  (VENTOLIN  HFA) 108 (90 Base) MCG/ACT inhaler [id number] Yes Inhale 2 puffs into the lungs every 4 (four) hours as needed for wheezing or shortness of breath. Jeneal Danita Macintosh, MD  Active Self, Pharmacy Records  allopurinol  (ZYLOPRIM ) 100 MG tablet [id number] Yes TAKE 1 TABLET BY MOUTH EVERY DAY Norleen Lynwood ORN, MD  Active Self, Pharmacy Records  atropine  1 % ophthalmic solution [id number] Yes Place 1 drop into the left eye daily. [provider]  Active Self, Pharmacy Records  Azelastine  HCl 137 MCG/SPRAY SOLN [id number] Yes PLACE 2 SPRAYS INTO BOTH NOSTRILS 2 (TWO) TIMES DAILY. USE IN EACH NOSTRIL AS DIRECTED Norleen Lynwood ORN, MD  Active Self, Pharmacy Records  brimonidine  (ALPHAGAN ) 0.2 % ophthalmic solution [id number] Yes 3 (three) times daily. 1 drop in the left eye three times daily [provider]  Active Self, Pharmacy Records  cetirizine  (ZYRTEC ) 10 MG tablet [id number] Yes TAKE 1 TABLET BY MOUTH EVERY DAY Padgett, Danita Macintosh, MD  Active Self, Pharmacy Records  citalopram  (CELEXA ) 40 MG tablet [id number] Yes TAKE 1 TABLET BY MOUTH EVERY DAY Norleen Lynwood ORN, MD  Active Self, Pharmacy Records  clonazePAM  (KLONOPIN ) 0.5 MG tablet [id number] Yes Take 1 tablet (0.5 mg total) by mouth daily as needed (vertigo). Norleen Lynwood ORN, MD  Active Self, Pharmacy Records  colchicine  0.6 MG tablet [id number] Yes TAKE 0.5 TABLETS (0.3 MG TOTAL) BY MOUTH DAILY AS NEEDED (GOUT OR PSUEDOGOUT PAIN). Corey, Evan S, MD  Active Self, Pharmacy Records  diltiazem  (CARDIZEM  CD) 180 MG 24 hr capsule [id number] Yes Take 1 capsule (180 mg total) by mouth daily. Shlomo Wilbert SAUNDERS, MD  Active Self, Pharmacy Records  erythromycin  ophthalmic ointment [id number] Yes Place 1 Application  into the left eye at bedtime. [provider]  Active Self, Pharmacy Records  famotidine  (PEPCID ) 20 MG tablet [id number] Yes Take 1 tablet  (20 mg total) by mouth 2 (two) times daily. Jeneal Danita Macintosh, MD  Active Self, Pharmacy Records           Med Note Fromberg, DUROJAHYE' R   Tue May 12, 2024 10:24 PM)    Fluticasone -Umeclidin-Vilant (TRELEGY ELLIPTA ) 200-62.5-25 MCG/ACT AEPB [id number] Yes Inhale 1 puff into the lungs daily. Jeneal Danita Macintosh, MD  Active Self, Pharmacy Records  latanoprost  (XALATAN ) 0.005 % ophthalmic solution [id number] Yes Place 1 drop into the left eye at bedtime. [provider]  Active Self, Pharmacy Records  levocetirizine (XYZAL ) 5 MG tablet [id number] Yes Take 1 tablet (5 mg total) by mouth daily. Jeneal Danita Macintosh, MD  Active Self, Pharmacy Records           Med Note [facility], DUROJAHYE' R   Tue May 12, 2024 10:25 PM)    meclizine  (ANTIVERT ) 25 MG tablet [id number] Yes TAKE 1 TABLET BY MOUTH 3 TIMES A DAY AS NEEDED FOR DIZZINESS John, James W, MD  Active Self, Pharmacy Records  metoprolol  succinate (TOPROL -XL) 100 MG 24 hr tablet [id number] Yes Take 1 tablet (100 mg total) by mouth daily. Take with or immediately following a meal. Akula, Vijaya, MD  Active   ondansetron  (ZOFRAN -ODT) 4 MG disintegrating tablet [id number] Yes Take 1 tablet (4 mg total) by mouth every 8 (eight) hours as needed for nausea or vomiting. Davia Nydia POUR, MD  Active Self, Pharmacy Records  pantoprazole  (PROTONIX ) 40 MG tablet [id number] Yes Take 1 tablet (40 mg total) by mouth daily. Akula, Vijaya, MD  Active   potassium chloride  (KLOR-CON  10) 10 MEQ tablet [id number] Yes Take 1 tablet (10 mEq total) by mouth daily for 5 days. Norleen Lynwood ORN, MD  Active Self, Pharmacy Records  rosuvastatin  (CRESTOR ) 40 MG tablet [id number] Yes TAKE 1 TABLET BY MOUTH EVERY DAY Shlomo Wilbert SAUNDERS, MD  Active Self, Pharmacy Records  sodium bicarbonate  650 MG tablet [id number] Yes Take 2 tablets (1,300 mg total) by mouth 2 (two) times daily.  Patient taking differently: Take 1,300 mg by mouth 2 (two) times daily. 06/22/24: Reports  during TOC call currently taking once every day   Sebastian Toribio GAILS, MD  Active Self, Pharmacy Records           Med Note [facility], RECARDO HERO   Sun Jun 07, 2024  8:22 PM) Takes 1 tab once daily  traZODone  (DESYREL ) 100 MG tablet [id number] Yes TAKE 1 TABLET (100 MG TOTAL) BY MOUTH AT BEDTIME AS NEEDED. FOR SLEEP Norleen Lynwood ORN, MD  Active Self, Pharmacy Records  TYLENOL  500 MG tablet [id number] Yes Take 1,000 mg by mouth every 8 (eight) hours as needed for mild pain (pain score 1-3) (or headaches). [provider]  Active Self, Pharmacy Records           Home Care and Equipment/Supplies: Were Home Health Services Ordered?: Yes Name of Home Health Agency:: [facility]: services to be resumed as per rior to most recent hospitalization [phone] Has Agency set up a time to come to your home?: No EMR reviewed for Home Health Orders: Orders present/patient has not received call (refer to CM for follow-up) (Successfully re-enrolled into 30-day TOC program; patient reports she will call agency tomorrow to get scheduling information for resumption of care: re-provided agency number to patient: she declines need for assistance with making this call)  Any new equipment or medical supplies ordered?: No  Functional Questionnaire: Do you need assistance with bathing/showering or dressing?: Yes (family to assist as indicated post- recent surgery) Do you need assistance with meal preparation?: Yes (family assisting as indicated/ preparing meals post-recent surgery) Do you need assistance with eating?: No Do you have difficulty maintaining continence: No Do you need assistance with getting out of bed/getting out of a chair/moving?: No Do you have difficulty managing or taking your medications?: No  Follow up appointments reviewed: PCP Follow-up appointment confirmed?: Yes (care coordination outreach in real-time with scheduling care guide to successfully schedule hospital follow up PCP appointment  07/01/24) Date of PCP follow-up appointment?: 07/01/24 Follow-up Provider: PCP- Dr. Norleen Specialist [facility] Follow-up appointment confirmed?: Yes Date of Specialist follow-up appointment?: 06/24/24 Follow-Up Specialty Provider:: [facility] surgical team for staple removal 06/24/24; surgical provider office visit 07/03/24 Do you need transportation to your follow-up appointment?: No Do you understand care options if your condition(s) worsen?: Yes-patient verbalized understanding  SDOH Interventions Today    Flowsheet Row Most Recent Value  SDOH Interventions   Food Insecurity Interventions Intervention Not Indicated  Housing Interventions Intervention Not Indicated  Transportation Interventions Intervention Not Indicated  [uses [employer] bus at baseline: family providing transportation post- recent surgery]  Utilities Interventions Intervention Not Indicated   See TOC assessment tabs/ care plan for additional assessment/ TOC intervention information  Pls call/ message for questions,  Andros Channing Mckinney Tyrees Chopin, RN, BSN, CCRN Alumnus RN Care Manager  Transitions of Care  [facility] - [facility] [phone]: direct office

## 2024-06-23 ENCOUNTER — Ambulatory Visit: Payer: Self-pay | Admitting: Gastroenterology

## 2024-06-23 ENCOUNTER — Telehealth: Payer: Self-pay

## 2024-06-23 NOTE — Telephone Encounter (Signed)
 Copied from CRM #[id number]. Topic: Clinical - Home Health Verbal Orders >> Jun 23, 2024  1:19 PM Ahlexyia S wrote: Caller/Agency: Monique with [employer] Number: [phone] Service Requested: Physical Therapy Frequency: 1 week 1 2 week 1 with re certification Any new concerns about the patient? Yes, new diagnosis of colon cancer. Pt has an upcoming appointment with Dr. Norleen on 12/10.

## 2024-06-23 NOTE — Progress Notes (Signed)
 Patient is aware of diagnosis of colon cancer.  She has already undergone surgical resection and been seen by oncology.  Currently not planning to treat with chemotherapy. Please place tentative reminder for repeat colonoscopy in 1 year with Dr. Avram.

## 2024-06-24 NOTE — Progress Notes (Signed)
 Pt presented today for removal of staples from abdomen. S/p lap. Right colectomy  by Dr. JETTY on 06/24/2024. No signs of infection. Incision was prepped with AlcoholPrep. All staples removed successfully, and pt tolerated well.  Incision site area was red at the site and Dr. Tanda came to assess as he suspected that there was a small hematoma under the skin. After applying the steri strips and the patient was getting dressed, the incision opened and started to pour out red and brown fluid onto the patient clothes and the floor. After cleaning the area up, I packed there wound with plain packing and covered with 4x4s and compression tape. Advised pt to look for any signs of infection: swelling, increased redness, odorous drainage, major pain, or fever/chills and to call with any concerns or future questions. Pt verbalized understanding.    Dr. JETTY ordered STAT CT ABD and pelvis with Oral and IV contrast. Patient will keep follow up appointment as scheduled until we get the results back from the CT.  Next appointment on 07/03/2024 post op with Dr. JETTY    HM

## 2024-06-24 NOTE — Telephone Encounter (Signed)
 Ok for [employer]

## 2024-06-25 ENCOUNTER — Ambulatory Visit (HOSPITAL_COMMUNITY)
Admission: RE | Admit: 2024-06-25 | Discharge: 2024-06-25 | Disposition: A | Source: Ambulatory Visit | Attending: General Surgery | Admitting: General Surgery

## 2024-06-25 ENCOUNTER — Inpatient Hospital Stay (HOSPITAL_COMMUNITY)
Admission: EM | Admit: 2024-06-25 | Discharge: 2024-06-29 | DRG: 862 | Disposition: A | Source: Ambulatory Visit | Attending: Internal Medicine | Admitting: Internal Medicine

## 2024-06-25 ENCOUNTER — Other Ambulatory Visit: Payer: Self-pay

## 2024-06-25 ENCOUNTER — Other Ambulatory Visit (HOSPITAL_COMMUNITY): Payer: Self-pay | Admitting: General Surgery

## 2024-06-25 ENCOUNTER — Encounter (HOSPITAL_COMMUNITY): Payer: Self-pay

## 2024-06-25 DIAGNOSIS — R1084 Generalized abdominal pain: Secondary | ICD-10-CM

## 2024-06-25 DIAGNOSIS — F418 Other specified anxiety disorders: Secondary | ICD-10-CM | POA: Diagnosis present

## 2024-06-25 DIAGNOSIS — K651 Peritoneal abscess: Principal | ICD-10-CM | POA: Diagnosis present

## 2024-06-25 DIAGNOSIS — N1831 Chronic kidney disease, stage 3a: Secondary | ICD-10-CM | POA: Diagnosis not present

## 2024-06-25 DIAGNOSIS — T8143XA Infection following a procedure, organ and space surgical site, initial encounter: Secondary | ICD-10-CM

## 2024-06-25 DIAGNOSIS — C182 Malignant neoplasm of ascending colon: Secondary | ICD-10-CM | POA: Diagnosis not present

## 2024-06-25 DIAGNOSIS — I10 Essential (primary) hypertension: Secondary | ICD-10-CM | POA: Diagnosis present

## 2024-06-25 DIAGNOSIS — E785 Hyperlipidemia, unspecified: Secondary | ICD-10-CM | POA: Diagnosis present

## 2024-06-25 DIAGNOSIS — J45909 Unspecified asthma, uncomplicated: Secondary | ICD-10-CM | POA: Diagnosis present

## 2024-06-25 DIAGNOSIS — E876 Hypokalemia: Secondary | ICD-10-CM | POA: Diagnosis present

## 2024-06-25 DIAGNOSIS — D649 Anemia, unspecified: Secondary | ICD-10-CM | POA: Diagnosis present

## 2024-06-25 LAB — CBC WITH DIFFERENTIAL/PLATELET
Abs Immature Granulocytes: 0.07 K/uL (ref 0.00–0.07)
Basophils Absolute: 0.1 K/uL (ref 0.0–0.1)
Basophils Relative: 1 %
Eosinophils Absolute: 0 K/uL (ref 0.0–0.5)
Eosinophils Relative: 0 %
HCT: 34.1 % — ABNORMAL LOW (ref 36.0–46.0)
Hemoglobin: 11.1 g/dL — ABNORMAL LOW (ref 12.0–15.0)
Immature Granulocytes: 1 %
Lymphocytes Relative: 20 %
Lymphs Abs: 2.5 K/uL (ref 0.7–4.0)
MCH: 27.5 pg (ref 26.0–34.0)
MCHC: 32.6 g/dL (ref 30.0–36.0)
MCV: 84.6 fL (ref 80.0–100.0)
Monocytes Absolute: 0.9 K/uL (ref 0.1–1.0)
Monocytes Relative: 7 %
Neutro Abs: 9.4 K/uL — ABNORMAL HIGH (ref 1.7–7.7)
Neutrophils Relative %: 71 %
Platelets: 597 K/uL — ABNORMAL HIGH (ref 150–400)
RBC: 4.03 MIL/uL (ref 3.87–5.11)
RDW: 15.5 % (ref 11.5–15.5)
WBC: 12.9 K/uL — ABNORMAL HIGH (ref 4.0–10.5)
nRBC: 0 % (ref 0.0–0.2)

## 2024-06-25 LAB — COMPREHENSIVE METABOLIC PANEL WITH GFR
ALT: 53 U/L — ABNORMAL HIGH (ref 0–44)
AST: 42 U/L — ABNORMAL HIGH (ref 15–41)
Albumin: 2.6 g/dL — ABNORMAL LOW (ref 3.5–5.0)
Alkaline Phosphatase: 133 U/L — ABNORMAL HIGH (ref 38–126)
Anion gap: 11 (ref 5–15)
BUN: 7 mg/dL — ABNORMAL LOW (ref 8–23)
CO2: 19 mmol/L — ABNORMAL LOW (ref 22–32)
Calcium: 8.4 mg/dL — ABNORMAL LOW (ref 8.9–10.3)
Chloride: 109 mmol/L (ref 98–111)
Creatinine, Ser: 1.23 mg/dL — ABNORMAL HIGH (ref 0.44–1.00)
GFR, Estimated: 46 mL/min — ABNORMAL LOW (ref >60)
Glucose, Bld: 78 mg/dL (ref 70–99)
Potassium: 3.2 mmol/L — ABNORMAL LOW (ref 3.5–5.1)
Sodium: 139 mmol/L (ref 135–145)
Total Bilirubin: 0.3 mg/dL (ref 0.0–1.2)
Total Protein: 6.2 g/dL — ABNORMAL LOW (ref 6.5–8.1)

## 2024-06-25 LAB — MAGNESIUM: Magnesium: 1.6 mg/dL — ABNORMAL LOW (ref 1.7–2.4)

## 2024-06-25 MED ORDER — CITALOPRAM HYDROBROMIDE 20 MG PO TABS
40.0000 mg | ORAL_TABLET | Freq: Every day | ORAL | Status: DC
  Administered 2024-06-26 – 2024-06-29 (×4): 40 mg via ORAL
  Filled 2024-06-25 (×4): qty 2

## 2024-06-25 MED ORDER — ONDANSETRON HCL 4 MG/2ML IJ SOLN: 4.0000 mg | Freq: Four times a day (QID) | INTRAMUSCULAR | Status: DC | PRN

## 2024-06-25 MED ORDER — IOHEXOL 300 MG/ML  SOLN
100.0000 mL | Freq: Once | INTRAMUSCULAR | Status: AC | PRN
  Administered 2024-06-25: 100 mL via INTRAVENOUS

## 2024-06-25 MED ORDER — MAGNESIUM SULFATE 2 GM/50ML IV SOLN
2.0000 g | Freq: Once | INTRAVENOUS | Status: AC
  Administered 2024-06-25: 2 g via INTRAVENOUS
  Filled 2024-06-25: qty 50

## 2024-06-25 MED ORDER — DILTIAZEM HCL ER COATED BEADS 180 MG PO CP24
180.0000 mg | ORAL_CAPSULE | Freq: Every day | ORAL | Status: DC
  Administered 2024-06-26 – 2024-06-29 (×4): 180 mg via ORAL
  Filled 2024-06-25 (×4): qty 1

## 2024-06-25 MED ORDER — BUDESON-GLYCOPYRROL-FORMOTEROL 160-9-4.8 MCG/ACT IN AERO
2.0000 | INHALATION_SPRAY | Freq: Two times a day (BID) | RESPIRATORY_TRACT | Status: DC
  Administered 2024-06-26 – 2024-06-29 (×7): 2 via RESPIRATORY_TRACT
  Filled 2024-06-25: qty 5.9

## 2024-06-25 MED ORDER — ONDANSETRON HCL 4 MG PO TABS: 4.0000 mg | ORAL_TABLET | Freq: Four times a day (QID) | ORAL | Status: DC | PRN

## 2024-06-25 MED ORDER — TRAZODONE HCL 100 MG PO TABS: 100.0000 mg | ORAL_TABLET | Freq: Every evening | ORAL | Status: DC | PRN

## 2024-06-25 MED ORDER — LACTATED RINGERS IV SOLN: INTRAVENOUS | Status: AC

## 2024-06-25 MED ORDER — ALBUTEROL SULFATE HFA 108 (90 BASE) MCG/ACT IN AERS: 2.0000 | INHALATION_SPRAY | RESPIRATORY_TRACT | Status: DC | PRN

## 2024-06-25 MED ORDER — PIPERACILLIN-TAZOBACTAM 3.375 G IVPB
3.3750 g | Freq: Three times a day (TID) | INTRAVENOUS | Status: DC
  Administered 2024-06-26 – 2024-06-28 (×7): 3.375 g via INTRAVENOUS
  Filled 2024-06-25 (×7): qty 50

## 2024-06-25 MED ORDER — PIPERACILLIN-TAZOBACTAM 3.375 G IVPB 30 MIN
3.3750 g | Freq: Once | INTRAVENOUS | Status: AC
  Administered 2024-06-25: 3.375 g via INTRAVENOUS
  Filled 2024-06-25: qty 50

## 2024-06-25 MED ORDER — HYDRALAZINE HCL 20 MG/ML IJ SOLN
10.0000 mg | INTRAMUSCULAR | Status: DC | PRN
  Administered 2024-06-25: 10 mg via INTRAVENOUS
  Filled 2024-06-25: qty 1

## 2024-06-25 MED ORDER — MORPHINE SULFATE (PF) 2 MG/ML IV SOLN
1.0000 mg | INTRAVENOUS | Status: DC | PRN
  Administered 2024-06-25 – 2024-06-26 (×3): 1 mg via INTRAVENOUS
  Filled 2024-06-25 (×3): qty 1

## 2024-06-25 MED ORDER — METOPROLOL SUCCINATE ER 50 MG PO TB24
100.0000 mg | ORAL_TABLET | Freq: Every day | ORAL | Status: DC
  Administered 2024-06-26 – 2024-06-29 (×4): 100 mg via ORAL
  Filled 2024-06-25 (×4): qty 2

## 2024-06-25 MED ORDER — OXYCODONE HCL 5 MG PO TABS
5.0000 mg | ORAL_TABLET | ORAL | Status: DC | PRN
  Administered 2024-06-25 – 2024-06-29 (×6): 5 mg via ORAL
  Filled 2024-06-25 (×7): qty 1

## 2024-06-25 MED ORDER — ALBUTEROL SULFATE (2.5 MG/3ML) 0.083% IN NEBU: 2.5000 mg | INHALATION_SOLUTION | RESPIRATORY_TRACT | Status: DC | PRN

## 2024-06-25 MED ORDER — POLYETHYLENE GLYCOL 3350 17 G PO PACK: 17.0000 g | PACK | Freq: Every day | ORAL | Status: DC | PRN

## 2024-06-25 MED ORDER — POTASSIUM CHLORIDE 10 MEQ/100ML IV SOLN
10.0000 meq | INTRAVENOUS | Status: AC
  Administered 2024-06-26 (×3): 10 meq via INTRAVENOUS
  Filled 2024-06-25 (×3): qty 100

## 2024-06-25 MED ORDER — IOHEXOL 9 MG/ML PO SOLN
1000.0000 mL | ORAL | Status: AC
  Administered 2024-06-25: 1000 mL via ORAL

## 2024-06-25 MED ORDER — PANTOPRAZOLE SODIUM 40 MG PO TBEC
40.0000 mg | DELAYED_RELEASE_TABLET | Freq: Every day | ORAL | Status: DC
  Administered 2024-06-26 – 2024-06-29 (×4): 40 mg via ORAL
  Filled 2024-06-25 (×4): qty 1

## 2024-06-25 NOTE — H&P (Signed)
 History and Physical    Brooke [name] FMW:[id number] DOB: Dec 25, 1950 DOA: 06/25/2024  PCP: Norleen Lynwood ORN, MD  Patient coming from: Home  I have personally briefly reviewed patient's old medical records in [facility]  Chief Complaint: Intra-abdominal abscess  HPI: Brooke [name] is a 73 y.o. female with medical history significant for asthma, CKD stage IIIa, HTN, HLD, depression/anxiety, recently diagnosed stage II adenocarcinoma of right colon s/p right colectomy 06/12/2024 who presented to the ED for evaluation of postoperative intra-abdominal abscess.  Patient recently admitted 11/16-11/30 with initial workup concerning for focal mass/neoplasm involving the ascending colon.  Colonoscopy on 11/20 showed severe ulcerated stricture at the hepatic flexure concerning for malignancy.  She underwent laparoscopic assisted right colectomy with extended resection of distal ileum and lysis of adhesions by Dr. Tanda on 11/21.  She required total 4 unit PRBC transfusion for postoperative anemia.  She was discharged to home.  She had follow-up in general surgery clinic yesterday 12/3 for removal of staples.  Per documentation, there was suspicion of small hematoma under the skin.  When patient was getting dressed the incision opened and started to pour out red and brown fluid.  Area was cleaned and wound was packed.  Stat CT abdomen/pelvis was ordered.  The CT abdomen/pelvis with contrast was obtained this afternoon: IMPRESSION: 1. Recent right hemicolectomy. Heterogeneous air-fluid collection in the right upper quadrant measures 7.5 x 7.1 cm, consistent with abscess. This has surrounding edema and soft tissue stranding. No internal enteric contrast to suggest operative leak. 2. Trace bilateral pleural effusions, right greater than left. 3. Generalized subcutaneous edema and fat stranding about the anterior abdominal wall, as well as both lateral flanks. No organized subcutaneous collection. 4.  Prominent 8 mm short axis lymph nodes in the low pelvis, slightly increased in size from prior exam, likely reactive.  Patient was notified of results and advised to come to the ED for further evaluation.  Patient states that she has seen clear pink discharge from her wound.  She denies fevers, chills, diaphoresis, nausea, vomiting.  She has not had any chest pain or dyspnea.  She reports good bowel movements and urinary output.  She says appetite has been low.  ED Course  Labs/Imaging on admission: I have personally reviewed following labs and imaging studies.  Initial vitals showed BP 176/83, pulse 81, RR 18, temp 97.6 F, SpO2 94% on room air.  Labs showed sodium 139, potassium 3.2, bicarb 19, BUN 7, creatinine 1.23, serum glucose 78, AST 42, ALT 53, alk phos 133, total bilirubin 0.3, WBC 12.9, hemoglobin 11.1, platelets 597.  Patient was given IV Zosyn.  EDP discussed with general surgery (Dr. Vernetta) who recommended medical admission, IR consult for drain placement, and their team will see in consultation.  The hospitalist service was consulted for admission.  Review of Systems: All systems reviewed and are negative except as documented in history of present illness above.   Past Medical History:  Diagnosis Date   Alopecia    Anxiety    Aortic atherosclerosis    Asthma    FOLLOWED BY PCP   Cancer of ascending colon pT3pN0 s/p right colectomy 06/12/2024 06/19/2024   Chronic kidney disease, stage 3a (HCC) 06/25/2024   Eczema    GERD (gastroesophageal reflux disease)    Gout    04-28-2018--- per pt stable , as been a while since last episode   Heart murmur    History of colon polyps    History of syncope  2015   Hyperlipidemia    Hypertension    Hypothyroidism    OA (osteoarthritis) of knee    bilateral   PAT (paroxysmal atrial tachycardia)    Occurred in the setting of ileus and electrolyte abnormalities   PVC's (premature ventricular contractions)    Toxic multinodular  goiter    03/ 2003  s/p RAI   Type 2 diabetes mellitus (HCC)    followed by pcp   Ventricular tachycardia, polymorphic (HCC) 01/04/2014   primary cardiologist-- dr wilbert turner (hx monitor 2015 showed couplet PVCs, as trigger)   Wears glasses    Wears partial dentures    upper    Past Surgical History:  Procedure Laterality Date   ABDOMINAL HYSTERECTOMY  10/22/1999   WITH BSO   CATARACT EXTRACTION W/ INTRAOCULAR LENS IMPLANT Left YRS AGO   COLONOSCOPY     COLONOSCOPY N/A 06/11/2024   Procedure: COLONOSCOPY;  Surgeon: Stacia Glendia BRAVO, MD;  Location: [facility];  Service: Gastroenterology;  Laterality: N/A;   EXCISION ABDOMINAL WALL MASS  12-20-2005   dr merrilyn @[facility]    neruofibroma   LAPAROSCOPIC CHOLECYSTECTOMY  10/01/2002   dr merrilyn @[facility]    LAPAROSCOPIC PARTIAL COLECTOMY N/A 06/12/2024   Procedure: LAPAROSCOPIC ASSISTED RIGHT COLECTOMY;  Surgeon: Tanda Locus, MD;  Location: [facility];  Service: General;  Laterality: N/A;  LAPAROSCOPIC ASSISTED RIGHT COLECTOMY WITH EXTENDED RESECTION OF DISTAL ILEUM   LEFT HEART CATHETERIZATION WITH CORONARY ANGIOGRAM N/A 01/07/2014   Procedure: LEFT HEART CATHETERIZATION WITH CORONARY ANGIOGRAM;  Surgeon: Peter M Jordan, MD;  Location: [facility];  Service: Cardiovascular;  Laterality: N/A;   RASTELLI PROCEDURE  6/98 neg   RENAL ARTERY STENT Left 11/2003   angioplasty and stenting   RENAL ARTERY STENT Left 02/2005   re-stenting   TOTAL KNEE ARTHROPLASTY Right 05/05/2018   Procedure: RIGHT TOTAL KNEE ARTHROPLASTY;  Surgeon: Liam Lerner, MD;  Location: [facility];  Service: Orthopedics;  Laterality: Right;   TOTAL KNEE ARTHROPLASTY Left 09/30/2018   Procedure: TOTAL KNEE ARTHROPLASTY;  Surgeon: Sheril Coy, MD;  Location: [facility];  Service: Orthopedics;  Laterality: Left;    Social History: Social History   Tobacco Use   Smoking status: Never    Passive exposure: Never   Smokeless tobacco: Never  Vaping Use   Vaping status: Never Used   Substance Use Topics   Alcohol use: No   Drug use: Never   Allergies  Allergen Reactions   Gnp Glp-1 Daily Support [Germanium] Other (See Comments)    Ileus requiring hospitalization   Metformin  And Related Nausea And Vomiting   Ace Inhibitors Swelling and Other (See Comments)    Ankles swell   Codeine Nausea And Vomiting and Rash   Hydrocodone  Nausea And Vomiting   Tramadol  Nausea And Vomiting   Tylenol  [Acetaminophen ] Itching, Rash and Other (See Comments)    Allergic to prescription strength Tylenol . The patient is taking 500 mg Tylenol  in 2025, however.    Family History  Problem Relation Age of Onset   Diabetes Mother    Heart disease Father    Colon cancer Neg Hx    Esophageal cancer Neg Hx    Rectal cancer Neg Hx    Stomach cancer Neg Hx    BRCA 1/2 Neg Hx    Breast cancer Neg Hx      Prior to Admission medications   Medication Sig Start Date End Date Taking? Authorizing Provider  albuterol  (VENTOLIN  HFA) 108 (90 Base) MCG/ACT inhaler Inhale 2 puffs into  the lungs every 4 (four) hours as needed for wheezing or shortness of breath. 03/05/24   Jeneal Danita Macintosh, MD  allopurinol  (ZYLOPRIM ) 100 MG tablet TAKE 1 TABLET BY MOUTH EVERY DAY 05/06/24   Norleen Lynwood ORN, MD  atropine  1 % ophthalmic solution Place 1 drop into the left eye daily. 01/16/24   [provider]  Azelastine  HCl 137 MCG/SPRAY SOLN PLACE 2 SPRAYS INTO BOTH NOSTRILS 2 (TWO) TIMES DAILY. USE IN [facility] NOSTRIL AS DIRECTED 04/03/24   Norleen Lynwood ORN, MD  brimonidine  (ALPHAGAN ) 0.2 % ophthalmic solution 3 (three) times daily. 1 drop in the left eye three times daily    [provider]  cetirizine  (ZYRTEC ) 10 MG tablet TAKE 1 TABLET BY MOUTH EVERY DAY 05/05/24   Jeneal Danita Macintosh, MD  citalopram  (CELEXA ) 40 MG tablet TAKE 1 TABLET BY MOUTH EVERY DAY 03/06/24   Norleen Lynwood ORN, MD  clonazePAM  (KLONOPIN ) 0.5 MG tablet Take 1 tablet (0.5 mg total) by mouth daily as needed (vertigo). 02/25/23    Norleen Lynwood ORN, MD  colchicine  0.6 MG tablet TAKE 0.5 TABLETS (0.3 MG TOTAL) BY MOUTH DAILY AS NEEDED (GOUT OR PSUEDOGOUT PAIN). 10/01/22   Joane Artist RAMAN, MD  diltiazem  (CARDIZEM  CD) 180 MG 24 hr capsule Take 1 capsule (180 mg total) by mouth daily. 06/01/24 08/30/24  Shlomo Wilbert SAUNDERS, MD  erythromycin  ophthalmic ointment Place 1 Application into the left eye at bedtime. 05/29/24   [provider]  famotidine  (PEPCID ) 20 MG tablet Take 1 tablet (20 mg total) by mouth 2 (two) times daily. 03/05/24   Jeneal Danita Macintosh, MD  Fluticasone -Umeclidin-Vilant (TRELEGY ELLIPTA ) 200-62.5-25 MCG/ACT AEPB Inhale 1 puff into the lungs daily. 03/05/24   Jeneal Danita Macintosh, MD  latanoprost  (XALATAN ) 0.005 % ophthalmic solution Place 1 drop into the left eye at bedtime.    [provider]  levocetirizine (XYZAL ) 5 MG tablet Take 1 tablet (5 mg total) by mouth daily. 03/05/24   Jeneal Danita Macintosh, MD  meclizine  (ANTIVERT ) 25 MG tablet TAKE 1 TABLET BY MOUTH 3 TIMES A DAY AS NEEDED FOR DIZZINESS 11/04/23   Norleen Lynwood ORN, MD  metoprolol  succinate (TOPROL -XL) 100 MG 24 hr tablet Take 1 tablet (100 mg total) by mouth daily. Take with or immediately following a meal. 06/22/24   Cherlyn Labella, MD  ondansetron  (ZOFRAN -ODT) 4 MG disintegrating tablet Take 1 tablet (4 mg total) by mouth every 8 (eight) hours as needed for nausea or vomiting. 04/16/24   Rai, Nydia POUR, MD  pantoprazole  (PROTONIX ) 40 MG tablet Take 1 tablet (40 mg total) by mouth daily. 06/22/24   Akula, Vijaya, MD  potassium chloride  (KLOR-CON  10) 10 MEQ tablet Take 1 tablet (10 mEq total) by mouth daily for 5 days. 06/02/24 06/22/24  Norleen Lynwood ORN, MD  rosuvastatin  (CRESTOR ) 40 MG tablet TAKE 1 TABLET BY MOUTH EVERY DAY 04/22/24   Shlomo Wilbert SAUNDERS, MD  sodium bicarbonate  650 MG tablet Take 2 tablets (1,300 mg total) by mouth 2 (two) times daily. Patient taking differently: Take 1,300 mg by mouth 2 (two) times daily. 06/22/24: Reports  during TOC call currently taking once every day 05/17/24   Sebastian Toribio GAILS, MD  traZODone  (DESYREL ) 100 MG tablet TAKE 1 TABLET (100 MG TOTAL) BY MOUTH AT BEDTIME AS NEEDED. FOR SLEEP 03/03/24   Norleen Lynwood ORN, MD  TYLENOL  500 MG tablet Take 1,000 mg by mouth every 8 (eight) hours as needed for mild pain (pain score 1-3) (or headaches).  [provider]    Physical Exam: Vitals:   06/25/24 2035 06/25/24 2036 06/25/24 2037 06/25/24 2224  BP: (!) 173/99   (!) 181/74  Pulse: 78   81  Resp: 18   17  Temp: 98.5 F (36.9 C)   98.6 F (37 C)  TempSrc: Oral   Oral  SpO2: 100% 99%  100%  Weight:   83.5 kg   Height:   5' 3 (1.6 m)    Constitutional: Resting in bed, NAD, calm, comfortable Eyes: EOMI, lids and conjunctivae normal ENMT: Mucous membranes are moist. Posterior pharynx clear of any exudate or lesions.Normal dentition.  Neck: normal, supple, no masses. Respiratory: clear to auscultation bilaterally, no wheezing, no crackles. Normal respiratory effort. No accessory muscle use.  Cardiovascular: Regular rate and rhythm, soft systolic murmur. No extremity edema. 2+ pedal pulses. Abdomen: RUQ, epigastric, periumbilical tenderness to palpation. Musculoskeletal: no clubbing / cyanosis. No joint deformity upper and lower extremities. Good ROM, no contractures. Normal muscle tone.  Skin: Open surgical wound of abdomen as pictured below, wound dressing and packing removed by RN at bedside and to be replaced.  No active discharge appreciated. Neurologic: Sensation intact. Strength equal bilaterally. Psychiatric: Normal judgment and insight. Alert and oriented x 3. Normal mood.    EKG: Not performed.  Assessment/Plan Principal Problem:   Postoperative intra-abdominal abscess [facility]) Active Problems:   Cancer of ascending colon pT3pN0 s/p right colectomy 06/12/2024   Hyperlipidemia   Normocytic anemia   Essential hypertension   Asthma   Hypokalemia   Depression with anxiety    Hypomagnesemia   Chronic kidney disease, stage 3a (HCC)   Tessla E Paye is a 73 y.o. female with medical history significant for asthma, CKD stage IIIa, HTN, HLD, depression/anxiety, recently diagnosed stage II adenocarcinoma of right colon s/p right colectomy 06/12/2024 who is admitted with postoperative intra-abdominal abscess.  Assessment and Plan: Postoperative intra-abdominal abscess s/p laparoscopic right colectomy 06/12/2024: Outpatient CT A/P showed a 7.5 x 7.1 cm air-fluid collection in the RUQ consistent with abscess.  No evidence of operative leak.  Patient has mild leukocytosis otherwise hemodynamically stable. - General Surgery following, IR consult for drain placement ordered - NPO after midnight - Continue IV fluid hydration overnight - Continue IV Zosyn  Hypomagnesemia/hypokalemia: IV supplement ordered.  Normocytic anemia: Hemoglobin stable at 11.1.  Continue to monitor.  CKD stage IIIa: Renal function relatively stable.  Continue to monitor.  Stage II adenocarcinoma of right colon s/p right colectomy 06/12/2024: Seen by oncology, Dr. Lanny, during recent admission.  No plan for adjuvant chemotherapy for now.  Outpatient follow-up scheduled for 07/10/2024.  Hypertension: Continue metoprolol  and diltiazem .  Asthma: Continue Trelegy Ellipta  and albuterol  as needed.  Hyperlipidemia: Hold rosuvastatin  with mild LFT elevation.  Depression/anxiety: Continue Celexa .   DVT prophylaxis: SCDs Start: 06/25/24 2221 Code Status: Full code, confirmed with patient on admission Family Communication: Discussed with patient, she has discussed with family Disposition Plan: From home, dispo pending clinical progress Consults called: General Surgery Severity of Illness: The appropriate patient status for this patient is INPATIENT. Inpatient status is judged to be reasonable and necessary in order to provide the required intensity of service to ensure the patient's safety. The  patient's presenting symptoms, physical exam findings, and initial radiographic and laboratory data in the context of their chronic comorbidities is felt to place them at high risk for further clinical deterioration. Furthermore, it is not anticipated that the patient will be medically stable for discharge from the hospital  within 2 midnights of admission.   * I certify that at the point of admission it is my clinical judgment that the patient will require inpatient hospital care spanning beyond 2 midnights from the point of admission due to high intensity of service, high risk for further deterioration and high frequency of surveillance required.DEWAINE Jorie Blanch MD [facility]  If 7PM-7AM, please contact night-coverage [email or web address]  06/25/2024, 10:28 PM

## 2024-06-25 NOTE — Hospital Course (Signed)
 Brooke [name] is a 73 y.o. female with medical history significant for asthma, CKD stage IIIa, HTN, HLD, depression/anxiety, recently diagnosed stage II adenocarcinoma of right colon s/p right colectomy 06/12/2024 who is admitted with postoperative intra-abdominal abscess.

## 2024-06-25 NOTE — ED Triage Notes (Signed)
 Patient states PCP advised to be seen d/t abscess that what resulted in her CT today. Patient report nausea denies vomiting. Patient denies fever. S/P lap colectomy 11/21. Hx Colon Ca.

## 2024-06-25 NOTE — Telephone Encounter (Signed)
 Called and left voicemail giving verbals.

## 2024-06-25 NOTE — Progress Notes (Signed)
 Patient ID: Brooke [name], female   DOB: 07/07/1951, 73 y.o.   MRN: [id number]   Pt known to [facility] s/p Laparoscopic assisted Right colectomy for colon cancer on 06/12/24 by Dr. Tanda.  Discharged home 11/30.  Per report, patient came to the office for staple removal and a hematoma was drained and packed at the mid-line.  A CT scan this evening showed a 7 cm x 7 cm in the right upper quadrant posterior to the bowel anastomosis.  No leak of contrast seen.  Recommend medical admission given medical problems and IR consult for drain placement which I ordered for tomorrow.  From a surgical standpoint, she can have clear liquids and then NPO at midnight.  Surgery will follow.

## 2024-06-25 NOTE — ED Provider Notes (Addendum)
 [place] EMERGENCY DEPARTMENT AT [facility] Provider Note   CSN: [id number] Arrival date & time: 06/25/24  8071     Patient presents with: Post-op Problem   Brooke [name] is a 73 y.o. female.   Patient is a 73 year old female who presents with drainage from the surgical wound.  She had a recent right hemicolectomy for a colon mass that was done on November 21.  She went to have the staples removed yesterday in the office and once the staples were removed, the wound was examined and there was a large amount of purulent drainage.  CT scan was ordered which was done today.  It reveals a large abscess and possible operative leak at the area.  She does complain of pain in the area.  She denies any fevers.  No vomiting.  She is having bowel movements.       Prior to Admission medications   Medication Sig Start Date End Date Taking? Authorizing Provider  albuterol  (VENTOLIN  HFA) 108 (90 Base) MCG/ACT inhaler Inhale 2 puffs into the lungs every 4 (four) hours as needed for wheezing or shortness of breath. 03/05/24   Jeneal Danita Macintosh, MD  allopurinol  (ZYLOPRIM ) 100 MG tablet TAKE 1 TABLET BY MOUTH EVERY DAY 05/06/24   Norleen Lynwood ORN, MD  atropine  1 % ophthalmic solution Place 1 drop into the left eye daily. 01/16/24   [provider]  Azelastine  HCl 137 MCG/SPRAY SOLN PLACE 2 SPRAYS INTO BOTH NOSTRILS 2 (TWO) TIMES DAILY. USE IN [facility] NOSTRIL AS DIRECTED 04/03/24   Norleen Lynwood ORN, MD  brimonidine  (ALPHAGAN ) 0.2 % ophthalmic solution 3 (three) times daily. 1 drop in the left eye three times daily    [provider]  cetirizine  (ZYRTEC ) 10 MG tablet TAKE 1 TABLET BY MOUTH EVERY DAY 05/05/24   Jeneal Danita Macintosh, MD  citalopram  (CELEXA ) 40 MG tablet TAKE 1 TABLET BY MOUTH EVERY DAY 03/06/24   Norleen Lynwood ORN, MD  clonazePAM  (KLONOPIN ) 0.5 MG tablet Take 1 tablet (0.5 mg total) by mouth daily as needed (vertigo). 02/25/23   Norleen Lynwood ORN, MD  colchicine  0.6 MG  tablet TAKE 0.5 TABLETS (0.3 MG TOTAL) BY MOUTH DAILY AS NEEDED (GOUT OR PSUEDOGOUT PAIN). 10/01/22   Joane Artist RAMAN, MD  diltiazem  (CARDIZEM  CD) 180 MG 24 hr capsule Take 1 capsule (180 mg total) by mouth daily. 06/01/24 08/30/24  Shlomo Wilbert SAUNDERS, MD  erythromycin  ophthalmic ointment Place 1 Application into the left eye at bedtime. 05/29/24   [provider]  famotidine  (PEPCID ) 20 MG tablet Take 1 tablet (20 mg total) by mouth 2 (two) times daily. 03/05/24   Jeneal Danita Macintosh, MD  Fluticasone -Umeclidin-Vilant (TRELEGY ELLIPTA ) 200-62.5-25 MCG/ACT AEPB Inhale 1 puff into the lungs daily. 03/05/24   Jeneal Danita Macintosh, MD  latanoprost  (XALATAN ) 0.005 % ophthalmic solution Place 1 drop into the left eye at bedtime.    [provider]  levocetirizine (XYZAL ) 5 MG tablet Take 1 tablet (5 mg total) by mouth daily. 03/05/24   Jeneal Danita Macintosh, MD  meclizine  (ANTIVERT ) 25 MG tablet TAKE 1 TABLET BY MOUTH 3 TIMES A DAY AS NEEDED FOR DIZZINESS 11/04/23   Norleen Lynwood ORN, MD  metoprolol  succinate (TOPROL -XL) 100 MG 24 hr tablet Take 1 tablet (100 mg total) by mouth daily. Take with or immediately following a meal. 06/22/24   Cherlyn Labella, MD  ondansetron  (ZOFRAN -ODT) 4 MG disintegrating tablet Take 1 tablet (4 mg total) by mouth every 8 (eight)  hours as needed for nausea or vomiting. 04/16/24   Rai, Nydia POUR, MD  pantoprazole  (PROTONIX ) 40 MG tablet Take 1 tablet (40 mg total) by mouth daily. 06/22/24   Cherlyn Labella, MD  potassium chloride  (KLOR-CON  10) 10 MEQ tablet Take 1 tablet (10 mEq total) by mouth daily for 5 days. 06/02/24 06/22/24  Norleen Lynwood ORN, MD  rosuvastatin  (CRESTOR ) 40 MG tablet TAKE 1 TABLET BY MOUTH EVERY DAY 04/22/24   Shlomo Wilbert SAUNDERS, MD  sodium bicarbonate  650 MG tablet Take 2 tablets (1,300 mg total) by mouth 2 (two) times daily. Patient taking differently: Take 1,300 mg by mouth 2 (two) times daily. 06/22/24: Reports during TOC call currently taking once  every day 05/17/24   Sebastian Toribio GAILS, MD  traZODone  (DESYREL ) 100 MG tablet TAKE 1 TABLET (100 MG TOTAL) BY MOUTH AT BEDTIME AS NEEDED. FOR SLEEP 03/03/24   Norleen Lynwood ORN, MD  TYLENOL  500 MG tablet Take 1,000 mg by mouth every 8 (eight) hours as needed for mild pain (pain score 1-3) (or headaches).    [provider]    Allergies: Gnp glp-1 daily support [germanium], Metformin  and related, Ace inhibitors, Codeine, Hydrocodone , Tramadol , and Tylenol  [acetaminophen ]    Review of Systems  Constitutional:  Negative for chills, diaphoresis, fatigue and fever.  HENT:  Negative for congestion, rhinorrhea and sneezing.   Eyes: Negative.   Respiratory:  Negative for cough, chest tightness and shortness of breath.   Cardiovascular:  Negative for chest pain and leg swelling.  Gastrointestinal:  Positive for abdominal pain. Negative for diarrhea, nausea and vomiting.  Genitourinary:  Negative for difficulty urinating, flank pain and frequency.  Musculoskeletal:  Negative for arthralgias and back pain.  Skin:  Negative for rash.  Neurological:  Negative for dizziness, speech difficulty, weakness, numbness and headaches.    Updated Vital Signs BP (!) 173/99 (BP Location: Left Arm)   Pulse 78   Temp 98.5 F (36.9 C) (Oral)   Resp 18   Ht 5' 3 (1.6 m)   Wt 83.5 kg   SpO2 99%   BMI 32.59 kg/m   Physical Exam Constitutional:      Appearance: She is well-developed.  HENT:     Head: Normocephalic and atraumatic.  Eyes:     Pupils: Pupils are equal, round, and reactive to light.  Cardiovascular:     Rate and Rhythm: Normal rate and regular rhythm.     Heart sounds: Normal heart sounds.  Pulmonary:     Effort: Pulmonary effort is normal. No respiratory distress.     Breath sounds: Normal breath sounds. No wheezing or rales.  Chest:     Chest wall: No tenderness.  Abdominal:     General: Bowel sounds are normal.     Palpations: Abdomen is soft.     Tenderness: There is  abdominal tenderness. There is no guarding or rebound.     Comments: Tenderness to her mid and right abdomen.  There is a healing surgical incision.  There is a small amount of dehiscence of the wound and some brownish drainage.  Musculoskeletal:        General: Normal range of motion.     Cervical back: Normal range of motion and neck supple.  Lymphadenopathy:     Cervical: No cervical adenopathy.  Skin:    General: Skin is warm and dry.     Findings: No rash.  Neurological:     Mental Status: She is alert and oriented to person, place,  and time.     (all labs ordered are listed, but only abnormal results are displayed) Labs Reviewed  CBC WITH DIFFERENTIAL/PLATELET - Abnormal; Notable for the following components:      Result Value   WBC 12.9 (*)    Hemoglobin 11.1 (*)    HCT 34.1 (*)    Platelets 597 (*)    Neutro Abs 9.4 (*)    All other components within normal limits  COMPREHENSIVE METABOLIC PANEL WITH GFR - Abnormal; Notable for the following components:   Potassium 3.2 (*)    CO2 19 (*)    BUN 7 (*)    Creatinine, Ser 1.23 (*)    Calcium  8.4 (*)    Total Protein 6.2 (*)    Albumin  2.6 (*)    AST 42 (*)    ALT 53 (*)    Alkaline Phosphatase 133 (*)    GFR, Estimated 46 (*)    All other components within normal limits    EKG: None  Radiology: CT ABDOMEN PELVIS W CONTRAST Result Date: 06/25/2024 CLINICAL DATA:  Generalized abdominal pain. Status post recent laparoscopic partial colectomy. EXAM: CT ABDOMEN AND PELVIS WITH CONTRAST TECHNIQUE: Multidetector CT imaging of the abdomen and pelvis was performed using the standard protocol following bolus administration of intravenous contrast. RADIATION DOSE REDUCTION: This exam was performed according to the departmental dose-optimization program which includes automated exposure control, adjustment of the mA and/or kV according to patient size and/or use of iterative reconstruction technique. CONTRAST:  OMNIPAQUE   IOHEXOL  300 MG/ML  SOLN COMPARISON:  Preoperative CT 06/07/2024 FINDINGS: Lower chest: Trace bilateral pleural effusions, right greater than left. Patulous distal esophagus. Hepatobiliary: Multiple cysts in the liver, stable from prior exam. Stable biliary dilatation post cholecystectomy. Pancreas: Fatty atrophy.  No ductal dilatation or inflammation. Spleen: Heterogeneous enhancement without discrete lesion. Cleft posteriorly. Small amount of perisplenic fluid is again seen, simple in density. Adrenals/Urinary Tract: No adrenal nodule. No hydronephrosis or renal calculi. Low-density lesion arising from the upper left kidney measuring 2.7 cm, unchanged from prior exams. Unremarkable urinary bladder. Stomach/Bowel: Right hemicolectomy. Heterogeneous air-fluid collection in the right upper quadrant posterior to the surgical anastomosis measures 7.5 x 7.1 cm, without internal contrast. This has surrounding edema and soft tissue stranding. Collection abuts the inferior right lobe of the liver, small bowel loops in the right abdomen as well as lateral aspect of the duodenum. Administered enteric contrast reaches the colon. No bowel obstruction. No small bowel ileus. Colonic diverticulosis without diverticulitis. Vascular/Lymphatic: Aortic atherosclerosis. No aortic aneurysm. The portal vein is patent. Shotty retroperitoneal and central mesenteric nodes are likely reactive in this setting. Prominent 8 mm short axis lymph nodes in the low pelvis, series 2, image 71, slightly increased in size from prior. Reproductive: Status post hysterectomy. No adnexal masses. Other: Heterogeneous right upper quadrant fluid collection as described. Adjacent stranding and small amount of free fluid extends to the inferior aspect of the liver. Minimal pockets of free fluid in the mesentery without additional organized collection. Small amount of perisplenic fluid was present on prior exam. Postsurgical change of the midline anterior  abdominal wall. Small foci of gas in the anterior abdominal wall above the umbilicus likely related to prior laparoscopic port site. There is generalized subcutaneous edema and fat stranding about the anterior abdominal wall, as well as both lateral flanks. No organized subcutaneous collection. Probable sebaceous cyst in the right gluteal soft tissues. Musculoskeletal: There are no acute or suspicious osseous abnormalities. No intramuscular collection. IMPRESSION:  1. Recent right hemicolectomy. Heterogeneous air-fluid collection in the right upper quadrant measures 7.5 x 7.1 cm, consistent with abscess. This has surrounding edema and soft tissue stranding. No internal enteric contrast to suggest operative leak. 2. Trace bilateral pleural effusions, right greater than left. 3. Generalized subcutaneous edema and fat stranding about the anterior abdominal wall, as well as both lateral flanks. No organized subcutaneous collection. 4. Prominent 8 mm short axis lymph nodes in the low pelvis, slightly increased in size from prior exam, likely reactive. Aortic Atherosclerosis (ICD10-I70.0). These results will be called to the ordering clinician or representative by the Radiologist Assistant, and communication documented in the PACS or [employer]. Electronically Signed   By: Andrea Gasman M.D.   On: 06/25/2024 18:29     Procedures   Medications Ordered in the ED  piperacillin-tazobactam (ZOSYN) IVPB 3.375 g (3.375 g Intravenous New Bag/Given 06/25/24 2120)                                    Medical Decision Making Amount and/or Complexity of Data Reviewed Labs: ordered.  Risk Prescription drug management. Decision regarding hospitalization.   This patient presents to the ED for concern of abdominal pain, this involves an extensive number of treatment options, and is a complaint that carries with it a high risk of complications and morbidity.  I considered the following differential and admission  for this acute, potentially life threatening condition.  The differential diagnosis includes abscess, perforation, seroma, other postop complication  MDM:    Patient is 73 year old who presents with worsening abdominal pain and drainage from her surgical wound.  She is not febrile.  Her white count is elevated.  She was started on IV Zosyn.  I reviewed the CT scan that was done earlier today.  There is no large abscess.  Contrast extravasation which is suggestive of possible operatively.  I discussed with Dr. Vernetta with surgery.  He request medicine admission and they will consult on the patient in the morning.  Discussed with Dr. Tobie who will admit the patient for further treatment.  (Labs, imaging, consults)  Labs: I Ordered, and personally interpreted labs.  The pertinent results include: Elevated WBC count  Imaging Studies ordered: I ordered imaging studies including CT abdomen pelvis I independently visualized and interpreted imaging. I agree with the radiologist interpretation  Additional history obtained from chart.  External records from outside source obtained and reviewed including prior notes  Cardiac Monitoring: The patient was not maintained on a cardiac monitor.  If on the cardiac monitor, I personally viewed and interpreted the cardiac monitored which showed an underlying rhythm of:    Reevaluation: After the interventions noted above, I reevaluated the patient and found that they have :improved  Social Determinants of Health:    Disposition: Admit to hospital  Co morbidities that complicate the patient evaluation  Past Medical History:  Diagnosis Date   Alopecia    Anxiety    Aortic atherosclerosis    Asthma    FOLLOWED BY PCP   Eczema    GERD (gastroesophageal reflux disease)    Gout    04-28-2018--- per pt stable , as been a while since last episode   Heart murmur    History of colon polyps    History of syncope 2015   Hyperlipidemia     Hypertension    Hypothyroidism    OA (osteoarthritis) of knee  bilateral   PAT (paroxysmal atrial tachycardia)    Occurred in the setting of ileus and electrolyte abnormalities   PVC's (premature ventricular contractions)    Toxic multinodular goiter    03/ 2003  s/p RAI   Type 2 diabetes mellitus (HCC)    followed by pcp   Ventricular tachycardia, polymorphic (HCC) 01/04/2014   primary cardiologist-- dr wilbert turner (hx monitor 2015 showed couplet PVCs, as trigger)   Wears glasses    Wears partial dentures    upper     Medicines Meds ordered this encounter  Medications   piperacillin-tazobactam (ZOSYN) IVPB 3.375 g    Antibiotic Indication::   Intra-abdominal Infection    I have reviewed the patients home medicines and have made adjustments as needed  Problem List / ED Course: Problem List Items Addressed This Visit   None Visit Diagnoses       Intra-abdominal abscess [facility])    -  Primary                Final diagnoses:  Intra-abdominal abscess [facility])    ED Discharge Orders     None          Lenor Hollering, MD 06/25/24 2123    Lenor Hollering, MD 06/25/24 2127

## 2024-06-25 NOTE — Progress Notes (Signed)
 Pharmacy Note   A consult was received from an ED physician for Zosyn per pharmacy dosing.    The patient's profile has been reviewed for ht/wt/allergies/indication/available labs.    A one time order has been placed for Zosyn 3.375 gr IV x1 .    Further antibiotics/pharmacy consults should be ordered by admitting physician if indicated.                       Thank you,  Dolphus Roller, PharmD, BCPS 06/25/2024 9:02 PM

## 2024-06-26 ENCOUNTER — Encounter (HOSPITAL_COMMUNITY): Payer: Self-pay | Admitting: Internal Medicine

## 2024-06-26 ENCOUNTER — Other Ambulatory Visit: Payer: Self-pay

## 2024-06-26 ENCOUNTER — Inpatient Hospital Stay (HOSPITAL_COMMUNITY)

## 2024-06-26 DIAGNOSIS — K651 Peritoneal abscess: Secondary | ICD-10-CM | POA: Diagnosis not present

## 2024-06-26 DIAGNOSIS — T8143XA Infection following a procedure, organ and space surgical site, initial encounter: Secondary | ICD-10-CM | POA: Diagnosis not present

## 2024-06-26 DIAGNOSIS — C182 Malignant neoplasm of ascending colon: Secondary | ICD-10-CM

## 2024-06-26 LAB — CBC
HCT: 28.8 % — ABNORMAL LOW (ref 36.0–46.0)
Hemoglobin: 9 g/dL — ABNORMAL LOW (ref 12.0–15.0)
MCH: 27.6 pg (ref 26.0–34.0)
MCHC: 31.3 g/dL (ref 30.0–36.0)
MCV: 88.3 fL (ref 80.0–100.0)
Platelets: 357 K/uL (ref 150–400)
RBC: 3.26 MIL/uL — ABNORMAL LOW (ref 3.87–5.11)
RDW: 15.8 % — ABNORMAL HIGH (ref 11.5–15.5)
WBC: 9.1 K/uL (ref 4.0–10.5)
nRBC: 0 % (ref 0.0–0.2)

## 2024-06-26 LAB — COMPREHENSIVE METABOLIC PANEL WITH GFR
ALT: 45 U/L — ABNORMAL HIGH (ref 0–44)
AST: 62 U/L — ABNORMAL HIGH (ref 15–41)
Albumin: 2.1 g/dL — ABNORMAL LOW (ref 3.5–5.0)
Alkaline Phosphatase: 128 U/L — ABNORMAL HIGH (ref 38–126)
Anion gap: 12 (ref 5–15)
BUN: 7 mg/dL — ABNORMAL LOW (ref 8–23)
CO2: 17 mmol/L — ABNORMAL LOW (ref 22–32)
Calcium: 7.5 mg/dL — ABNORMAL LOW (ref 8.9–10.3)
Chloride: 111 mmol/L (ref 98–111)
Creatinine, Ser: 1.13 mg/dL — ABNORMAL HIGH (ref 0.44–1.00)
GFR, Estimated: 51 mL/min — ABNORMAL LOW (ref >60)
Glucose, Bld: 71 mg/dL (ref 70–99)
Potassium: 2.9 mmol/L — ABNORMAL LOW (ref 3.5–5.1)
Sodium: 140 mmol/L (ref 135–145)
Total Bilirubin: 0.2 mg/dL (ref 0.0–1.2)
Total Protein: 4.7 g/dL — ABNORMAL LOW (ref 6.5–8.1)

## 2024-06-26 LAB — MAGNESIUM: Magnesium: 1.9 mg/dL (ref 1.7–2.4)

## 2024-06-26 MED ORDER — MIDAZOLAM HCL 2 MG/2ML IJ SOLN
INTRAMUSCULAR | Status: AC
  Filled 2024-06-26: qty 2

## 2024-06-26 MED ORDER — SODIUM BICARBONATE 650 MG PO TABS
650.0000 mg | ORAL_TABLET | Freq: Two times a day (BID) | ORAL | Status: AC
  Administered 2024-06-26 (×2): 650 mg via ORAL
  Filled 2024-06-26 (×2): qty 1

## 2024-06-26 MED ORDER — FENTANYL CITRATE (PF) 100 MCG/2ML IJ SOLN
INTRAMUSCULAR | Status: AC
  Filled 2024-06-26: qty 2

## 2024-06-26 MED ORDER — FENTANYL CITRATE (PF) 100 MCG/2ML IJ SOLN
INTRAMUSCULAR | Status: AC | PRN
  Administered 2024-06-26 (×2): 50 ug via INTRAVENOUS

## 2024-06-26 MED ORDER — MIDAZOLAM HCL (PF) 2 MG/2ML IJ SOLN
INTRAMUSCULAR | Status: AC | PRN
  Administered 2024-06-26 (×2): 1 mg via INTRAVENOUS

## 2024-06-26 MED ORDER — SODIUM CHLORIDE 0.9% FLUSH
5.0000 mL | Freq: Three times a day (TID) | INTRAVENOUS | Status: DC
  Administered 2024-06-26 – 2024-06-29 (×9): 5 mL

## 2024-06-26 MED ORDER — POTASSIUM CHLORIDE CRYS ER 20 MEQ PO TBCR
40.0000 meq | EXTENDED_RELEASE_TABLET | Freq: Two times a day (BID) | ORAL | Status: AC
  Administered 2024-06-26 (×2): 40 meq via ORAL
  Filled 2024-06-26 (×2): qty 2

## 2024-06-26 NOTE — Plan of Care (Signed)
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
## 2024-06-26 NOTE — Progress Notes (Signed)
 Mobility Specialist Progress Note:   06/26/24 1040  Mobility  Activity Ambulated with assistance  Level of Assistance Contact guard assist, steadying assist  Assistive Device Front wheel walker  Distance Ambulated (ft) 120 ft  Activity Response Tolerated fair  Mobility Referral Yes  Mobility visit 1 Mobility  Mobility Specialist Start Time (ACUTE ONLY) [id number]  Mobility Specialist Stop Time (ACUTE ONLY) 1006  Mobility Specialist Time Calculation (min) (ACUTE ONLY) 18 min   Pt was received in bed and agreed to mobility. Pt stated pain in back during ambulation. Returned to bed with all needs met. Call bell in reach.  [employer] - Mobility Specialist

## 2024-06-26 NOTE — Procedures (Addendum)
 Vascular and Interventional Radiology Procedure Note  Patient: Brooke [name] DOB: October 27, 1950 Medical Record Number: [id number] Note Date/Time: 06/26/24 12:32 PM   Performing Physician: Thom Hall, MD Assistant(s): None  Diagnosis: RUQ abscess  Procedure: DRAINAGE CATHETER PLACEMENT into a RUQ ABSCESS  Anesthesia: Conscious Sedation Complications: None Estimated Blood Loss: Minimal Specimens: Sent for Gram Stain, Aerobe Culture, and Anerobe Culture  Findings:  Successful CT-guided placement of 14 F catheter into RUQ abscess. 5 mL of dark sanguineous (old blood) aspirated and submitted for analysis.  Plan:  - Flush drain with 5 mL Normal Saline every 8 hours. - Follow up drain evaluation / sinogram in 7 day(s).  See detailed procedure note with images in PACS. The patient tolerated the procedure well without incident or complication and was returned to Recovery in stable condition.    Thom Hall, MD Vascular and Interventional Radiology Specialists [facility]   Pager. [phone] Clinic. [phone]

## 2024-06-26 NOTE — Progress Notes (Signed)
 Lab orders entered per Dr. Demetra request

## 2024-06-26 NOTE — Progress Notes (Signed)
 Progress Note     Subjective: Patient reports pain that is manageable around midline wound. She was previously tolerating PO intake. She is currently Npo. Last BM was yesterday. Reports flatulence. Denies nausea and vomiting.   ROS  All negative with the exception of above.  Objective: Vital signs in last 24 hours: Temp:  [97.6 F (36.4 C)-98.6 F (37 C)] 98.4 F (36.9 C) (12/05 0953) Pulse Rate:  [78-88] 88 (12/05 0953) Resp:  [16-18] 18 (12/05 0953) BP: (120-181)/(55-99) 151/69 (12/05 0953) SpO2:  [94 %-100 %] 100 % (12/05 0953) Weight:  [83.5 kg-84.8 kg] 84.8 kg (12/05 0500) Last BM Date : 06/25/24  Intake/Output from previous day: 12/04 0701 - 12/05 0700 In: 589.2 [P.O.:150; I.V.:135.9; IV Piggyback:303.2] Out: 0  Intake/Output this shift: No intake/output data recorded.  PE: General: Pleasant female who is laying in bed in NAD. HEENT: Head is normocephalic, atraumatic. Sclera are noninjected.  Heart: Normal HR during encounter.  Lungs: Respiratory effort nonlabored. Abd: Soft with some distention. Tenderness around abdominal wound. Wound tunnels towards cephalad. No discharge or active bleeding. Wet-to-dry dressing exchanged during encounter.  MS: Able to move all 4 extremities. Skin: Warm and dry.  Psych: A&Ox3 with an appropriate affect.    Lab Results:  Recent Labs    06/25/24 2007 06/26/24 0447  WBC 12.9* 9.1  HGB 11.1* 9.0*  HCT 34.1* 28.8*  PLT 597* 357   BMET Recent Labs    06/25/24 2007 06/26/24 0447  NA 139 140  K 3.2* 2.9*  CL 109 111  CO2 19* 17*  GLUCOSE 78 71  BUN 7* 7*  CREATININE 1.23* 1.13*  CALCIUM  8.4* 7.5*   PT/INR No results for input(s): LABPROT, INR in the last 72 hours. CMP     Component Value Date/Time   NA 140 06/26/2024 0447   NA 143 06/01/2024 0958   K 2.9 (L) 06/26/2024 0447   CL 111 06/26/2024 0447   CO2 17 (L) 06/26/2024 0447   GLUCOSE 71 06/26/2024 0447   GLUCOSE 96 05/27/2006 1052   BUN 7 (L)  06/26/2024 0447   BUN 16 06/01/2024 0958   CREATININE 1.13 (H) 06/26/2024 0447   CREATININE 0.90 01/01/2014 1046   CALCIUM  7.5 (L) 06/26/2024 0447   PROT 4.7 (L) 06/26/2024 0447   PROT 4.8 (L) 06/01/2024 0958   ALBUMIN  2.1 (L) 06/26/2024 0447   ALBUMIN  2.5 (L) 06/01/2024 0958   AST 62 (H) 06/26/2024 0447   ALT 45 (H) 06/26/2024 0447   ALKPHOS 128 (H) 06/26/2024 0447   BILITOT 0.2 06/26/2024 0447   BILITOT 0.2 06/01/2024 0958   GFRNONAA 51 (L) 06/26/2024 0447   GFRAA 39 (L) 09/24/2018 1054   Lipase     Component Value Date/Time   LIPASE <10 (L) 06/07/2024 1350       Studies/Results: CT ABDOMEN PELVIS W CONTRAST Result Date: 06/25/2024 CLINICAL DATA:  Generalized abdominal pain. Status post recent laparoscopic partial colectomy. EXAM: CT ABDOMEN AND PELVIS WITH CONTRAST TECHNIQUE: Multidetector CT imaging of the abdomen and pelvis was performed using the standard protocol following bolus administration of intravenous contrast. RADIATION DOSE REDUCTION: This exam was performed according to the departmental dose-optimization program which includes automated exposure control, adjustment of the mA and/or kV according to patient size and/or use of iterative reconstruction technique. CONTRAST:  OMNIPAQUE  IOHEXOL  300 MG/ML  SOLN COMPARISON:  Preoperative CT 06/07/2024 FINDINGS: Lower chest: Trace bilateral pleural effusions, right greater than left. Patulous distal esophagus. Hepatobiliary: Multiple cysts in the  liver, stable from prior exam. Stable biliary dilatation post cholecystectomy. Pancreas: Fatty atrophy.  No ductal dilatation or inflammation. Spleen: Heterogeneous enhancement without discrete lesion. Cleft posteriorly. Small amount of perisplenic fluid is again seen, simple in density. Adrenals/Urinary Tract: No adrenal nodule. No hydronephrosis or renal calculi. Low-density lesion arising from the upper left kidney measuring 2.7 cm, unchanged from prior exams. Unremarkable urinary  bladder. Stomach/Bowel: Right hemicolectomy. Heterogeneous air-fluid collection in the right upper quadrant posterior to the surgical anastomosis measures 7.5 x 7.1 cm, without internal contrast. This has surrounding edema and soft tissue stranding. Collection abuts the inferior right lobe of the liver, small bowel loops in the right abdomen as well as lateral aspect of the duodenum. Administered enteric contrast reaches the colon. No bowel obstruction. No small bowel ileus. Colonic diverticulosis without diverticulitis. Vascular/Lymphatic: Aortic atherosclerosis. No aortic aneurysm. The portal vein is patent. Shotty retroperitoneal and central mesenteric nodes are likely reactive in this setting. Prominent 8 mm short axis lymph nodes in the low pelvis, series 2, image 71, slightly increased in size from prior. Reproductive: Status post hysterectomy. No adnexal masses. Other: Heterogeneous right upper quadrant fluid collection as described. Adjacent stranding and small amount of free fluid extends to the inferior aspect of the liver. Minimal pockets of free fluid in the mesentery without additional organized collection. Small amount of perisplenic fluid was present on prior exam. Postsurgical change of the midline anterior abdominal wall. Small foci of gas in the anterior abdominal wall above the umbilicus likely related to prior laparoscopic port site. There is generalized subcutaneous edema and fat stranding about the anterior abdominal wall, as well as both lateral flanks. No organized subcutaneous collection. Probable sebaceous cyst in the right gluteal soft tissues. Musculoskeletal: There are no acute or suspicious osseous abnormalities. No intramuscular collection. IMPRESSION: 1. Recent right hemicolectomy. Heterogeneous air-fluid collection in the right upper quadrant measures 7.5 x 7.1 cm, consistent with abscess. This has surrounding edema and soft tissue stranding. No internal enteric contrast to suggest  operative leak. 2. Trace bilateral pleural effusions, right greater than left. 3. Generalized subcutaneous edema and fat stranding about the anterior abdominal wall, as well as both lateral flanks. No organized subcutaneous collection. 4. Prominent 8 mm short axis lymph nodes in the low pelvis, slightly increased in size from prior exam, likely reactive. Aortic Atherosclerosis (ICD10-I70.0). These results will be called to the ordering clinician or representative by the Radiologist Assistant, and communication documented in the PACS or [employer]. Electronically Signed   By: Andrea Gasman M.D.   On: 06/25/2024 18:29    Anti-infectives: Anti-infectives (From admission, onward)    Start     Dose/Rate Route Frequency Ordered Stop   06/26/24 0400  piperacillin -tazobactam (ZOSYN ) IVPB 3.375 g        3.375 g 12.5 mL/hr over 240 Minutes Intravenous Every 8 hours 06/25/24 2246     06/25/24 2115  piperacillin -tazobactam (ZOSYN ) IVPB 3.375 g        3.375 g 100 mL/hr over 30 Minutes Intravenous  Once 06/25/24 2102 06/25/24 2150        Assessment/Plan S/P Laparoscopic assisted Right colectomy for colon cancer on 06/12/24 by Dr. Tanda. Returned after outpatient visit where hematoma was drained from abdominal wound and staples removed. -CT from 12/4 showed concerns of abscess.  -Afebrile.  -WBC 9.1 from 12.9; HGB 9.0 from 11.1. Continue to monitor.  -Exam with small abdominal wound that tunnels towards cephalad. No drainage or active bleeding on exam today. Pain is  manageable. -Recommend wet-to-dry dressing changes BID or when soiled. -Continue antibiotics. -IR has been consulted to consider drain placement. Procedure scheduled for today.  -Will continue to follow.    FEN: NPO for IR procedure; IVF per primary team VTE: SCDs ID: [email or web address]      LOS: 1 day    I reviewed specialist notes, consulting provider notes, hospitalist notes, nursing notes, last 24 h vitals and pain scores, last 48  h intake and output, last 24 h labs and trends, and last 24 h imaging results.   [name], [facility] Surgery 06/26/2024, 10:01 AM Please see Amion for pager number during day hours 7:00am-4:30pm

## 2024-06-26 NOTE — Progress Notes (Signed)
 [facility] PROGRESS NOTE    Progress Note  Brooke [name]  FMW:[id number] DOB: Feb 08, 1951 DOA: 06/25/2024 PCP: Norleen Lynwood ORN, MD     Brief Narrative:   Brooke [name] is an 73 y.o. female past medical history significant for asthma, chronic kidney disease stage IIIa, essential hypertension depression anxiety recently diagnosed with stage II adenocarcinoma of the right colon status post right colectomy in June 12, 2024 comes back to the ED for a postoperative intra-abdominal abscess.  CT scan done showed a right upper quadrant 7.5 x 7.1 cm probable abscess with surrounding edema and tissue stranding. Assessment/Plan:   Postoperative intra-abdominal abscess (HCC) CT scan of the abdomen pelvis showed an abscess in the right upper quadrant. General surgery was consulted as well as IR for drain placement. Currently NPO. On IV fluids for maintenance fluid. Started on IV Zosyn .  Electrolyte imbalance hypomagnesemia hypokalemia: She is slightly acidotic which is probably leading to her hypokalemia.  Replete potassium orally. Magnesium  has improved. Started on bicarb tablets recheck a renal panel tomorrow morning.  Normocytic anemia: Hemoglobin appears to be stable.  Chronic kidney disease stage IIIa: Creatinine appears to be at baseline.  Cancer of ascending colon pT3pN0 s/p right colectomy 06/12/2024 Continues to follow-up with Dr. Lanny for adjuvant therapy as an outpatient.  Essential hypertension: Continue metoprolol  and diltiazem .  Asthma: Currently stable continue inhalers.  Hyperlipidemia: Holding statin in the setting of mild ovation LFT's.  Depression/anxiety: Continue Celexa .   DVT prophylaxis: lovenox  Family Communication:none Status is: Inpatient Remains inpatient appropriate because: Abscess drainage    Code Status:     Code Status Orders  (From admission, onward)           Start     Ordered   06/25/24 2222  Full code  Continuous        Question:  By:  Answer:  Consent: discussion documented in EHR   06/25/24 2221           Code Status History     Date Active Date Inactive Code Status Order ID Comments User Context   06/07/2024 2001 06/21/2024 2048 Full Code [id number]  Waddell Rake, MD Inpatient   05/12/2024 2133 05/17/2024 2011 Full Code [id number]  Marcene Eva NOVAK, DO ED   04/29/2024 1727 05/05/2024 2107 Full Code [id number]  Barbarann Nest, MD Inpatient   04/15/2024 0456 04/16/2024 1550 Full Code [id number]  Franky Redia SAILOR, MD Inpatient   02/18/2024 2115 02/21/2024 1851 Full Code [id number]  Franky Redia SAILOR, MD ED   09/30/2018 1243 10/02/2018 1807 Full Code [id number]  Lenis Prentice RIGGERS Inpatient   05/05/2018 1237 05/08/2018 1442 Full Code [id number]  Liam Lerner, MD Inpatient   01/07/2014 1427 01/07/2014 2210 Full Code [id number]  Jordan, Peter M, MD Inpatient         IV Access:   Peripheral IV   Procedures and diagnostic studies:   CT ABDOMEN PELVIS W CONTRAST Result Date: 06/25/2024 CLINICAL DATA:  Generalized abdominal pain. Status post recent laparoscopic partial colectomy. EXAM: CT ABDOMEN AND PELVIS WITH CONTRAST TECHNIQUE: Multidetector CT imaging of the abdomen and pelvis was performed using the standard protocol following bolus administration of intravenous contrast. RADIATION DOSE REDUCTION: This exam was performed according to the departmental dose-optimization program which includes automated exposure control, adjustment of the mA and/or kV according to patient size and/or use of iterative reconstruction technique. CONTRAST:  OMNIPAQUE  IOHEXOL  300 MG/ML  SOLN COMPARISON:  Preoperative CT 06/07/2024 FINDINGS: Lower chest: Trace bilateral pleural effusions, right  greater than left. Patulous distal esophagus. Hepatobiliary: Multiple cysts in the liver, stable from prior exam. Stable biliary dilatation post cholecystectomy. Pancreas: Fatty atrophy.  No ductal dilatation or inflammation. Spleen:  Heterogeneous enhancement without discrete lesion. Cleft posteriorly. Small amount of perisplenic fluid is again seen, simple in density. Adrenals/Urinary Tract: No adrenal nodule. No hydronephrosis or renal calculi. Low-density lesion arising from the upper left kidney measuring 2.7 cm, unchanged from prior exams. Unremarkable urinary bladder. Stomach/Bowel: Right hemicolectomy. Heterogeneous air-fluid collection in the right upper quadrant posterior to the surgical anastomosis measures 7.5 x 7.1 cm, without internal contrast. This has surrounding edema and soft tissue stranding. Collection abuts the inferior right lobe of the liver, small bowel loops in the right abdomen as well as lateral aspect of the duodenum. Administered enteric contrast reaches the colon. No bowel obstruction. No small bowel ileus. Colonic diverticulosis without diverticulitis. Vascular/Lymphatic: Aortic atherosclerosis. No aortic aneurysm. The portal vein is patent. Shotty retroperitoneal and central mesenteric nodes are likely reactive in this setting. Prominent 8 mm short axis lymph nodes in the low pelvis, series 2, image 71, slightly increased in size from prior. Reproductive: Status post hysterectomy. No adnexal masses. Other: Heterogeneous right upper quadrant fluid collection as described. Adjacent stranding and small amount of free fluid extends to the inferior aspect of the liver. Minimal pockets of free fluid in the mesentery without additional organized collection. Small amount of perisplenic fluid was present on prior exam. Postsurgical change of the midline anterior abdominal wall. Small foci of gas in the anterior abdominal wall above the umbilicus likely related to prior laparoscopic port site. There is generalized subcutaneous edema and fat stranding about the anterior abdominal wall, as well as both lateral flanks. No organized subcutaneous collection. Probable sebaceous cyst in the right gluteal soft tissues.  Musculoskeletal: There are no acute or suspicious osseous abnormalities. No intramuscular collection. IMPRESSION: 1. Recent right hemicolectomy. Heterogeneous air-fluid collection in the right upper quadrant measures 7.5 x 7.1 cm, consistent with abscess. This has surrounding edema and soft tissue stranding. No internal enteric contrast to suggest operative leak. 2. Trace bilateral pleural effusions, right greater than left. 3. Generalized subcutaneous edema and fat stranding about the anterior abdominal wall, as well as both lateral flanks. No organized subcutaneous collection. 4. Prominent 8 mm short axis lymph nodes in the low pelvis, slightly increased in size from prior exam, likely reactive. Aortic Atherosclerosis (ICD10-I70.0). These results will be called to the ordering clinician or representative by the Radiologist Assistant, and communication documented in the PACS or [employer]. Electronically Signed   By: Andrea Gasman M.D.   On: 06/25/2024 18:29     Medical Consultants:   None.   Subjective:    Jakaya Jacobowitz Fedak relates her pain is controlled has not had a bowel movement.  Objective:    Vitals:   06/25/24 2224 06/26/24 0133 06/26/24 0500 06/26/24 0517  BP: (!) 181/74 (!) 120/55  (!) 147/64  Pulse: 81 80  84  Resp: 17 16  16   Temp: 98.6 F (37 C) 98.4 F (36.9 C)  98.3 F (36.8 C)  TempSrc: Oral Oral  Oral  SpO2: 100% 99%  100%  Weight:   84.8 kg   Height:       SpO2: 100 %   Intake/Output Summary (Last 24 hours) at 06/26/2024 0710 Last data filed at 06/26/2024 0600 Gross per 24 hour  Intake 589.17 ml  Output 0 ml  Net 589.17 ml   [employer]  06/25/24 2037 06/26/24 0500  Weight: 83.5 kg 84.8 kg    Exam: General exam: In no acute distress. Respiratory system: Good air movement and clear to auscultation. Cardiovascular system: S1 & S2 heard, RRR. No JVD. Gastrointestinal system: Abdomen is nondistended, soft and nontender.  Extremities: No pedal  edema. Skin: No rashes, lesions or ulcers Psychiatry: Judgement and insight appear normal. Mood & affect appropriate.    Data Reviewed:    Labs: Basic Metabolic Panel: Recent Labs  Lab 06/19/24 1220 06/20/24 0339 06/21/24 0312 06/25/24 2007 06/26/24 0447  NA 140 140 140 139 140  K 3.1* 3.5 3.6 3.2* 2.9*  CL 110 112* 111 109 111  CO2 20* 20* 20* 19* 17*  GLUCOSE 173* 87 99 78 71  BUN 18 16 12  7* 7*  CREATININE 1.06* 1.11* 1.06* 1.23* 1.13*  CALCIUM  7.9* 7.5* 8.0* 8.4* 7.5*  MG 1.5* 2.5* 2.4 1.6* 1.9  PHOS  --  2.6 2.5  --   --    GFR Estimated Creatinine Clearance: 46.5 mL/min (A) (by C-G formula based on SCr of 1.13 mg/dL (H)). Liver Function Tests: Recent Labs  Lab 06/25/24 2007 06/26/24 0447  AST 42* 62*  ALT 53* 45*  ALKPHOS 133* 128*  BILITOT 0.3 0.2  PROT 6.2* 4.7*  ALBUMIN  2.6* 2.1*   No results for input(s): LIPASE, AMYLASE in the last 168 hours. No results for input(s): AMMONIA in the last 168 hours. Coagulation profile No results for input(s): INR, PROTIME in the last 168 hours. COVID-19 Labs  No results for input(s): DDIMER, FERRITIN, LDH, CRP in the last 72 hours.  Lab Results  Component Value Date   SARSCOV2NAA POSITIVE (A) 04/14/2024   SARSCOV2NAA NEGATIVE 02/18/2024   SARSCOV2NAA NEGATIVE 01/14/2020    CBC: Recent Labs  Lab 06/19/24 1220 06/20/24 0339 06/21/24 0312 06/25/24 2007 06/26/24 0447  WBC 13.5* 13.3* 11.9* 12.9* 9.1  NEUTROABS 11.3* 10.7* 9.5* 9.4*  --   HGB 7.8* 7.3* 9.0* 11.1* 9.0*  HCT 24.9* 22.9* 27.7* 34.1* 28.8*  MCV 88.6 87.4 87.7 84.6 88.3  PLT 243 280 314 597* 357   Cardiac Enzymes: No results for input(s): CKTOTAL, CKMB, CKMBINDEX, TROPONINI in the last 168 hours. BNP (last 3 results) No results for input(s): PROBNP in the last 8760 hours. CBG: Recent Labs  Lab 06/20/24 1159 06/20/24 1748 06/21/24 0016 06/21/24 0629 06/21/24 1200  GLUCAP 114* 117* 108* 97 150*    D-Dimer: No results for input(s): DDIMER in the last 72 hours. Hgb A1c: No results for input(s): HGBA1C in the last 72 hours. Lipid Profile: No results for input(s): CHOL, HDL, LDLCALC, TRIG, CHOLHDL, LDLDIRECT in the last 72 hours. Thyroid  function studies: No results for input(s): TSH, T4TOTAL, T3FREE, THYROIDAB in the last 72 hours.  Invalid input(s): FREET3 Anemia work up: No results for input(s): VITAMINB12, FOLATE, FERRITIN, TIBC, IRON, RETICCTPCT in the last 72 hours. Sepsis Labs: Recent Labs  Lab 06/20/24 0339 06/21/24 0312 06/25/24 2007 06/26/24 0447  WBC 13.3* 11.9* 12.9* 9.1   Microbiology No results found for this or any previous visit (from the past 240 hours).   Medications:    budesonide -glycopyrrolate -formoterol   2 puff Inhalation BID   citalopram   40 mg Oral Daily   diltiazem   180 mg Oral Daily   metoprolol  succinate  100 mg Oral Daily   pantoprazole   40 mg Oral Daily   potassium chloride   40 mEq Oral BID   sodium bicarbonate   650 mg Oral BID   Continuous Infusions:  lactated ringers  100 mL/hr at 06/26/24 0440   piperacillin -tazobactam (ZOSYN )  IV 12.5 mL/hr at 06/26/24 0440      LOS: 1 day   Erle Odell Castor  [facility]  06/26/2024, 7:10 AM

## 2024-06-26 NOTE — TOC Initial Note (Signed)
 Transition of Care [facility]) - Initial/Assessment Note    Patient Details  Name: Brooke [name] MRN: [id number] Date of Birth: 05-15-1951  Transition [facility] Care [facility]) CM/SW Contact:    Alfonse JONELLE Rex, RN Phone Number: 06/26/2024, 3:48 PM  Clinical Narrative:   Patient admitted from home on 12/4, presented with c/o abdominal pain, N/V.  Patient lives with son and daughter in law. Uses walker to ambulate, has PCP and insurance on file. Underwent  CT-guided placement of 14 F catheter into RUQ abscess today. INPT CM will follow for dc needs.                Expected Discharge Plan: Home w Home Health Services Barriers to Discharge: Continued Medical Work up   Patient Goals and CMS Choice Patient states their goals for this hospitalization and ongoing recovery are:: return home          Expected Discharge Plan and Services       Living arrangements for the past 2 months: Single Family Home                                      Prior Living Arrangements/Services Living arrangements for the past 2 months: Single Family Home Lives with:: Adult Children Patient language and need for interpreter reviewed:: Yes        Need for Family Participation in Patient Care: Yes (Comment) Care giver support system in place?: Yes (comment)   Criminal Activity/Legal Involvement Pertinent to Current Situation/Hospitalization: No - Comment as needed  Activities of Daily Living   ADL Screening (condition at time of admission) Independently performs ADLs?: Yes (appropriate for developmental age) Is the patient deaf or have difficulty hearing?: No Does the patient have difficulty seeing, even when wearing glasses/contacts?: No Does the patient have difficulty concentrating, remembering, or making decisions?: No  Permission Sought/Granted                  Emotional Assessment         Alcohol / Substance Use: Not Applicable Psych Involvement: No (comment)  Admission diagnosis:   Postoperative intra-abdominal abscess (HCC) [T81.43XA, K65.1] Intra-abdominal abscess (HCC) [K65.1] Patient Active Problem List   Diagnosis Date Noted   Postoperative intra-abdominal abscess (HCC) 06/25/2024   Chronic kidney disease, stage 3a (HCC) 06/25/2024   Cancer of ascending colon pT3pN0 s/p right colectomy 06/12/2024 06/19/2024   Benign neoplasm of descending colon 06/11/2024   Ileitis 06/09/2024   Colonic stricture (HCC) 06/08/2024   Appendix disease 06/07/2024   PAT (paroxysmal atrial tachycardia)    Metabolic acidosis 05/17/2024   Hypophosphatemia 05/15/2024   Diarrhea 05/14/2024   Hypomagnesemia 05/13/2024   Renal tubular acidosis 05/13/2024   History of essential hypertension 05/13/2024   Moderate persistent asthma 05/13/2024   Other fatigue 05/12/2024   Heart murmur 05/12/2024   Class 1 obesity due to excess calories with body mass index (BMI) of 31.0 to 31.9 in adult 05/05/2024   Small bowel obstruction (HCC) 05/02/2024   Hypernatremia 05/02/2024   Murmur, cardiac 05/01/2024   Abnormal echocardiogram 05/01/2024   Tachycardia 05/01/2024   Ileus (HCC) 04/28/2024   Nausea & vomiting 04/15/2024   COVID-19 virus infection 04/15/2024   Nausea and vomiting 04/15/2024   Dizziness 04/03/2024   Pneumonia 02/27/2024   UTI (urinary tract infection) 02/27/2024   Pancreatic lesion 02/27/2024   Cerumen impaction 02/27/2024   AKI (acute kidney injury) 02/20/2024  Scalp pain 06/08/2023   Significant closed head trauma within past 3 months 02/25/2023   Chronic cough 02/25/2023   COPD (chronic obstructive pulmonary disease) (HCC) 09/08/2022   Pseudogout of left wrist 08/08/2022   Generalized weakness 05/18/2022   Left ear impacted cerumen 05/16/2022   Left kidney mass 01/14/2022   Peripheral edema 01/14/2022   Left ear hearing loss 12/05/2021   Nasal crusting 12/04/2021   Left ear pain 08/29/2021   Grief 06/05/2021   Acute shoulder bursitis, left 06/05/2021   Bilateral  shoulder pain 03/05/2020   Vitamin D  deficiency 12/01/2019   Allergic rhinitis 06/16/2019   Dyspnea 01/27/2019   Primary osteoarthritis of left knee 09/30/2018   CKD stage 3b, GFR 30-44 ml/min (HCC) 07/25/2018   Chronic back pain 07/25/2018   Primary osteoarthritis of right knee 05/05/2018   Osteoarthritis of right knee 04/30/2018   Degenerative arthritis of knee, bilateral 08/07/2017   Depression with anxiety 07/19/2017   Vertigo 01/12/2017   Low back pain 01/12/2017   SOB (shortness of breath) 12/02/2016   Dysuria 09/12/2016   Bilateral knee pain 02/15/2015   Effusion of right knee 01/28/2015   Pronation deformity of both feet 09/29/2014   Greater trochanteric bursitis of left hip 08/31/2014   Degenerative arthritis of left knee 05/18/2014   Ventricular tachycardia, polymorphic (HCC) 01/04/2014   Abnormal MRI of head 12/04/2013   Syncope 11/06/2013   Chest pain 11/06/2013   Headache 11/06/2013   Nausea with vomiting 11/06/2013   Hypokalemia 08/18/2012   Diastolic dysfunction 01/15/2011   Leukocytosis 01/03/2011   Toe pain 12/20/2010   Encounter for well adult exam with abnormal findings 12/19/2010   FATIGUE 09/05/2009   DM2 (diabetes mellitus, type 2) (HCC) 11/29/2008   Gout 08/09/2008   Normocytic anemia 02/09/2008   GERD (gastroesophageal reflux disease) 02/09/2008   ALOPECIA 12/30/2007   Hyperlipidemia 10/01/2007   Essential hypertension 02/26/2007   History of colonic polyps 02/26/2007   Goiter 09/19/2006   Asthma 09/19/2006   ECZEMA, ATOPIC DERMATITIS 09/19/2006   Insomnia 09/19/2006   PCP:  Norleen Lynwood ORN, MD Pharmacy:   [phone] - [place], [place] - [address] AT [facility] [address] [place] [address] Phone: [phone] Fax: [phone]  [id number] [place], [place] - [address] AT [facility] [address] [place] [address] Phone: [phone] Fax:  [phone]     Social Drivers of Health (SDOH) Social History: SDOH Screenings   Food Insecurity: No Food Insecurity (06/25/2024)  Housing: Low Risk  (06/25/2024)  Transportation Needs: No Transportation Needs (06/25/2024)  Utilities: Not At Risk (06/25/2024)  Alcohol Screen: Low Risk  (09/24/2023)  Depression (PHQ2-9): Low Risk  (06/22/2024)  Financial Resource Strain: Low Risk  (09/24/2023)  Physical Activity: Insufficiently Active (09/24/2023)  Social Connections: Moderately Isolated (06/25/2024)  Stress: No Stress Concern Present (09/24/2023)  Tobacco Use: Low Risk  (06/25/2024)  Health Literacy: Adequate Health Literacy (09/24/2023)   SDOH Interventions:     Readmission Risk Interventions    06/26/2024    3:46 PM 05/14/2024   12:41 PM 05/05/2024   11:28 AM  Readmission Risk Prevention Plan  Transportation Screening Complete Complete Complete  HRI or Home Care Consult   Complete  Social Work Consult for Recovery Care Planning/Counseling   Complete  Palliative Care Screening   Not Applicable  Medication Review Oceanographer) Complete Complete Complete  PCP or Specialist appointment within 3-5 days of discharge Complete  HRI or Home Care Consult Complete Complete   SW Recovery Care/Counseling Consult Complete Complete   Palliative Care Screening Not Applicable Not Applicable   Skilled Nursing Facility Not Applicable Not Applicable

## 2024-06-27 DIAGNOSIS — T8143XA Infection following a procedure, organ and space surgical site, initial encounter: Secondary | ICD-10-CM | POA: Diagnosis not present

## 2024-06-27 DIAGNOSIS — K651 Peritoneal abscess: Secondary | ICD-10-CM | POA: Diagnosis not present

## 2024-06-27 DIAGNOSIS — I10 Essential (primary) hypertension: Secondary | ICD-10-CM

## 2024-06-27 LAB — RENAL FUNCTION PANEL
Albumin: 2.2 g/dL — ABNORMAL LOW (ref 3.5–5.0)
Anion gap: 10 (ref 5–15)
BUN: 7 mg/dL — ABNORMAL LOW (ref 8–23)
CO2: 20 mmol/L — ABNORMAL LOW (ref 22–32)
Calcium: 8.1 mg/dL — ABNORMAL LOW (ref 8.9–10.3)
Chloride: 113 mmol/L — ABNORMAL HIGH (ref 98–111)
Creatinine, Ser: 1.25 mg/dL — ABNORMAL HIGH (ref 0.44–1.00)
GFR, Estimated: 46 mL/min — ABNORMAL LOW (ref >60)
Glucose, Bld: 74 mg/dL (ref 70–99)
Phosphorus: 3 mg/dL (ref 2.5–4.6)
Potassium: 3.8 mmol/L (ref 3.5–5.1)
Sodium: 142 mmol/L (ref 135–145)

## 2024-06-27 LAB — CBC
HCT: 31.5 % — ABNORMAL LOW (ref 36.0–46.0)
Hemoglobin: 10.4 g/dL — ABNORMAL LOW (ref 12.0–15.0)
MCH: 28.1 pg (ref 26.0–34.0)
MCHC: 33 g/dL (ref 30.0–36.0)
MCV: 85.1 fL (ref 80.0–100.0)
Platelets: 528 K/uL — ABNORMAL HIGH (ref 150–400)
RBC: 3.7 MIL/uL — ABNORMAL LOW (ref 3.87–5.11)
RDW: 16.1 % — ABNORMAL HIGH (ref 11.5–15.5)
WBC: 8.5 K/uL (ref 4.0–10.5)
nRBC: 0 % (ref 0.0–0.2)

## 2024-06-27 MED ORDER — MORPHINE SULFATE (PF) 4 MG/ML IV SOLN
4.0000 mg | INTRAVENOUS | Status: DC | PRN
  Administered 2024-06-27 – 2024-06-29 (×6): 4 mg via INTRAVENOUS
  Filled 2024-06-27 (×6): qty 1

## 2024-06-27 NOTE — Progress Notes (Signed)
 [facility] PROGRESS NOTE    Progress Note  Brooke [name]  FMW:[id number] DOB: 07/02/1951 DOA: 06/25/2024 PCP: Norleen Lynwood ORN, MD     Brief Narrative:   Brooke [name] is an 73 y.o. female past medical history significant for asthma, chronic kidney disease stage IIIa, essential hypertension depression anxiety recently diagnosed with stage II adenocarcinoma of the right colon status post right colectomy in June 12, 2024 comes back to the ED for a postoperative intra-abdominal abscess.  CT scan done showed a right upper quadrant 7.5 x 7.1 cm probable abscess with surrounding edema and tissue stranding. Assessment/Plan:   Postoperative intra-abdominal abscess (HCC) CT scan of the abdomen pelvis showed an abscess in the right upper quadrant. General surgery was consulted, status post drain placement on 08/28/2023. Remains afebrile, with no leukocytosis. IV Zosyn , further management per surgery.  Electrolyte imbalance hypomagnesemia hypokalemia: Replete, now improve. Magnesium  has improved.  Normocytic anemia: Hemoglobin appears to be stable.  Chronic kidney disease stage IIIa: Creatinine appears to be at baseline.  Cancer of ascending colon pT3pN0 s/p right colectomy 06/12/2024 Continues to follow-up with Dr. Lanny for adjuvant therapy as an outpatient.  Essential hypertension: Continue metoprolol  and diltiazem .  Asthma: Currently stable continue inhalers.  Hyperlipidemia: Holding statin in the setting of mild ovation LFT's.  Depression/anxiety: Continue Celexa .   DVT prophylaxis: lovenox  Family Communication:none Status is: Inpatient Remains inpatient appropriate because: Abscess drainage    Code Status:     Code Status Orders  (From admission, onward)           Start     Ordered   06/25/24 2222  Full code  Continuous       Question:  By:  Answer:  Consent: discussion documented in EHR   06/25/24 2221           Code Status History      Date Active Date Inactive Code Status Order ID Comments User Context   06/07/2024 2001 06/21/2024 2048 Full Code [id number]  Waddell Rake, MD Inpatient   05/12/2024 2133 05/17/2024 2011 Full Code [id number]  Marcene Eva NOVAK, DO ED   04/29/2024 1727 05/05/2024 2107 Full Code [id number]  Barbarann Nest, MD Inpatient   04/15/2024 0456 04/16/2024 1550 Full Code [id number]  Franky Redia SAILOR, MD Inpatient   02/18/2024 2115 02/21/2024 1851 Full Code [id number]  Franky Redia SAILOR, MD ED   09/30/2018 1243 10/02/2018 1807 Full Code [id number]  Lenis Prentice RIGGERS Inpatient   05/05/2018 1237 05/08/2018 1442 Full Code [id number]  Liam Lerner, MD Inpatient   01/07/2014 1427 01/07/2014 2210 Full Code [id number]  Jordan, Peter M, MD Inpatient         IV Access:   Peripheral IV   Procedures and diagnostic studies:   CT GUIDED PERITONEAL/RETROPERITONEAL FLUID DRAIN BY PERC CATH Result Date: 06/26/2024 INDICATION: [id number] Intra-abdominal abscess (HCC) [id number] EXAM: CT-GUIDED RIGHT UPPER QUADRANT ABSCESS DRAINAGE CATHETER PLACEMENT COMPARISON:  CT AP, 06/25/2024. MEDICATIONS: The patient is currently admitted to the hospital and receiving intravenous antibiotics. The antibiotics were administered within an appropriate time frame prior to the initiation of the procedure. ANESTHESIA/SEDATION: Moderate (conscious) sedation was employed during this procedure. A total of Versed  2 mg and Fentanyl  100 mcg was administered intravenously. Moderate Sedation Time: 26 minutes. The patient's level of consciousness and vital signs were monitored continuously by radiology nursing throughout the procedure under my direct supervision. CONTRAST:  None FLUOROSCOPY TIME:  CT dose; 792 mGycm COMPLICATIONS: None immediate. PROCEDURE: RADIATION DOSE REDUCTION: This exam was  performed according to the departmental dose-optimization program which includes automated exposure control, adjustment of the mA and/or kV according to patient size  and/or use of iterative reconstruction technique. Informed written consent was obtained from the patient after a discussion of the risks, benefits and alternatives to treatment. The patient was placed supine on the CT gantry and a pre procedural CT was performed re-demonstrating the known abscess/fluid collection within the RIGHT upper quadrant. The procedure was planned. A timeout was performed prior to the initiation of the procedure. The RIGHT upper quadrant was prepped and draped in the usual sterile fashion. The overlying soft tissues were anesthetized with 1% lidocaine  with epinephrine . Appropriate trajectory was planned with the use of a 22 gauge spinal needle. An 18 gauge trocar needle was advanced into the abscess/fluid collection and a short Amplatz super stiff wire was coiled within the collection. Appropriate positioning was confirmed with a limited CT scan. The tract was serially dilated allowing placement of a 14 Fr drainage catheter. Appropriate positioning was confirmed with a limited postprocedural CT scan. 5 mL of dark sanguinous fluid was aspirated. The tube was connected to a bulb suction and sutured in place. A dressing was placed. The patient tolerated the procedure well without immediate post procedural complication. IMPRESSION: Successful CT-guided placement of a 14 Fr drainage catheter into the RIGHT upper quadrant Aspiration of 5 mL of dark sanguinous fluid, suspicious for hematoma. Samples were sent to the laboratory as requested by the ordering clinical team. RECOMMENDATIONS: The patient will return to Vascular Interventional Radiology (VIR) for routine drainage catheter evaluation in 7-10 days. Thom Hall, MD [facility] Electronically Signed   By: Thom Hall M.D.   On: 06/26/2024 17:35   CT ABDOMEN PELVIS W CONTRAST Result Date: 06/25/2024 CLINICAL DATA:  Generalized abdominal pain. Status post recent laparoscopic partial  colectomy. EXAM: CT ABDOMEN AND PELVIS WITH CONTRAST TECHNIQUE: Multidetector CT imaging of the abdomen and pelvis was performed using the standard protocol following bolus administration of intravenous contrast. RADIATION DOSE REDUCTION: This exam was performed according to the departmental dose-optimization program which includes automated exposure control, adjustment of the mA and/or kV according to patient size and/or use of iterative reconstruction technique. CONTRAST:  OMNIPAQUE  IOHEXOL  300 MG/ML  SOLN COMPARISON:  Preoperative CT 06/07/2024 FINDINGS: Lower chest: Trace bilateral pleural effusions, right greater than left. Patulous distal esophagus. Hepatobiliary: Multiple cysts in the liver, stable from prior exam. Stable biliary dilatation post cholecystectomy. Pancreas: Fatty atrophy.  No ductal dilatation or inflammation. Spleen: Heterogeneous enhancement without discrete lesion. Cleft posteriorly. Small amount of perisplenic fluid is again seen, simple in density. Adrenals/Urinary Tract: No adrenal nodule. No hydronephrosis or renal calculi. Low-density lesion arising from the upper left kidney measuring 2.7 cm, unchanged from prior exams. Unremarkable urinary bladder. Stomach/Bowel: Right hemicolectomy. Heterogeneous air-fluid collection in the right upper quadrant posterior to the surgical anastomosis measures 7.5 x 7.1 cm, without internal contrast. This has surrounding edema and soft tissue stranding. Collection abuts the inferior right lobe of the liver, small bowel loops in the right abdomen as well as lateral aspect of the duodenum. Administered enteric contrast reaches the colon. No bowel obstruction. No small bowel ileus. Colonic diverticulosis without diverticulitis. Vascular/Lymphatic: Aortic atherosclerosis. No aortic aneurysm. The portal vein is patent. Shotty retroperitoneal and central mesenteric nodes are likely reactive in this setting. Prominent 8 mm short axis lymph nodes in the  low pelvis, series 2, image 71, slightly increased in size from prior. Reproductive:  Status post hysterectomy. No adnexal masses. Other: Heterogeneous right upper quadrant fluid collection as described. Adjacent stranding and small amount of free fluid extends to the inferior aspect of the liver. Minimal pockets of free fluid in the mesentery without additional organized collection. Small amount of perisplenic fluid was present on prior exam. Postsurgical change of the midline anterior abdominal wall. Small foci of gas in the anterior abdominal wall above the umbilicus likely related to prior laparoscopic port site. There is generalized subcutaneous edema and fat stranding about the anterior abdominal wall, as well as both lateral flanks. No organized subcutaneous collection. Probable sebaceous cyst in the right gluteal soft tissues. Musculoskeletal: There are no acute or suspicious osseous abnormalities. No intramuscular collection. IMPRESSION: 1. Recent right hemicolectomy. Heterogeneous air-fluid collection in the right upper quadrant measures 7.5 x 7.1 cm, consistent with abscess. This has surrounding edema and soft tissue stranding. No internal enteric contrast to suggest operative leak. 2. Trace bilateral pleural effusions, right greater than left. 3. Generalized subcutaneous edema and fat stranding about the anterior abdominal wall, as well as both lateral flanks. No organized subcutaneous collection. 4. Prominent 8 mm short axis lymph nodes in the low pelvis, slightly increased in size from prior exam, likely reactive. Aortic Atherosclerosis (ICD10-I70.0). These results will be called to the ordering clinician or representative by the Radiologist Assistant, and communication documented in the PACS or [employer]. Electronically Signed   By: Andrea Gasman M.D.   On: 06/25/2024 18:29     Medical Consultants:   None.   Subjective:    Brooke [name] pain is not controlled.  Objective:     Vitals:   06/27/24 0500 06/27/24 0525 06/27/24 0817 06/27/24 1237  BP:  (!) 120/56  139/61  Pulse:  73  73  Resp:  16  16  Temp:  98.6 F (37 C)  98.3 F (36.8 C)  TempSrc:  Oral  Oral  SpO2:  97% 92% 99%  Weight: 85.1 kg     Height:       SpO2: 99 %   Intake/Output Summary (Last 24 hours) at 06/27/2024 1503 Last data filed at 06/27/2024 1300 Gross per 24 hour  Intake 1455 ml  Output 335 ml  Net 1120 ml   Filed Weights   06/25/24 2037 06/26/24 0500 06/27/24 0500  Weight: 83.5 kg 84.8 kg 85.1 kg    Exam: General exam: In no acute distress. Respiratory system: Good air movement and clear to auscultation. Cardiovascular system: S1 & S2 heard, RRR. No JVD. Gastrointestinal system: Abdomen is nondistended, soft and nontender.  Extremities: No pedal edema. Skin: No rashes, lesions or ulcers Psychiatry: Judgement and insight appear normal. Mood & affect appropriate. Data Reviewed:    Labs: Basic Metabolic Panel: Recent Labs  Lab 06/21/24 0312 06/25/24 2007 06/26/24 0447 06/27/24 0540  NA 140 139 140 142  K 3.6 3.2* 2.9* 3.8  CL 111 109 111 113*  CO2 20* 19* 17* 20*  GLUCOSE 99 78 71 74  BUN 12 7* 7* 7*  CREATININE 1.06* 1.23* 1.13* 1.25*  CALCIUM  8.0* 8.4* 7.5* 8.1*  MG 2.4 1.6* 1.9  --   PHOS 2.5  --   --  3.0   GFR Estimated Creatinine Clearance: 42.1 mL/min (A) (by C-G formula based on SCr of 1.25 mg/dL (H)). Liver Function Tests: Recent Labs  Lab 06/25/24 2007 06/26/24 0447 06/27/24 0540  AST 42* 62*  --   ALT 53* 45*  --   ALKPHOS 133* 128*  --  BILITOT 0.3 0.2  --   PROT 6.2* 4.7*  --   ALBUMIN  2.6* 2.1* 2.2*   No results for input(s): LIPASE, AMYLASE in the last 168 hours. No results for input(s): AMMONIA in the last 168 hours. Coagulation profile No results for input(s): INR, PROTIME in the last 168 hours. COVID-19 Labs  No results for input(s): DDIMER, FERRITIN, LDH, CRP in the last 72 hours.  Lab Results   Component Value Date   SARSCOV2NAA POSITIVE (A) 04/14/2024   SARSCOV2NAA NEGATIVE 02/18/2024   SARSCOV2NAA NEGATIVE 01/14/2020    CBC: Recent Labs  Lab 06/21/24 0312 06/25/24 2007 06/26/24 0447 06/27/24 1138  WBC 11.9* 12.9* 9.1 8.5  NEUTROABS 9.5* 9.4*  --   --   HGB 9.0* 11.1* 9.0* 10.4*  HCT 27.7* 34.1* 28.8* 31.5*  MCV 87.7 84.6 88.3 85.1  PLT 314 597* 357 528*   Cardiac Enzymes: No results for input(s): CKTOTAL, CKMB, CKMBINDEX, TROPONINI in the last 168 hours. BNP (last 3 results) No results for input(s): PROBNP in the last 8760 hours. CBG: Recent Labs  Lab 06/20/24 1748 06/21/24 0016 06/21/24 0629 06/21/24 1200  GLUCAP 117* 108* 97 150*   D-Dimer: No results for input(s): DDIMER in the last 72 hours. Hgb A1c: No results for input(s): HGBA1C in the last 72 hours. Lipid Profile: No results for input(s): CHOL, HDL, LDLCALC, TRIG, CHOLHDL, LDLDIRECT in the last 72 hours. Thyroid  function studies: No results for input(s): TSH, T4TOTAL, T3FREE, THYROIDAB in the last 72 hours.  Invalid input(s): FREET3 Anemia work up: No results for input(s): VITAMINB12, FOLATE, FERRITIN, TIBC, IRON, RETICCTPCT in the last 72 hours. Sepsis Labs: Recent Labs  Lab 06/21/24 9687 06/25/24 2007 06/26/24 0447 06/27/24 1138  WBC 11.9* 12.9* 9.1 8.5   Microbiology Recent Results (from the past 240 hours)  Aerobic/Anaerobic Culture w Gram Stain (surgical/deep wound)     Status: None (Preliminary result)   Collection Time: 06/26/24  2:55 PM   Specimen: Abscess  Result Value Ref Range Status   Specimen Description   Final    ABSCESS Performed at [facility], [address], [place], [place] [address]    Special Requests   Final    NONE Performed at [facility], [address], [place], [place] [address]    Gram Stain   Final    ABUNDANT WBC PRESENT, PREDOMINANTLY PMN NO ORGANISMS SEEN     Culture   Final    TOO YOUNG TO READ Performed at [facility], [address] [place], [place] [address]    Report Status PENDING  Incomplete     Medications:    budesonide -glycopyrrolate -formoterol   2 puff Inhalation BID   citalopram   40 mg Oral Daily   diltiazem   180 mg Oral Daily   metoprolol  succinate  100 mg Oral Daily   pantoprazole   40 mg Oral Daily   sodium chloride  flush  5 mL Intracatheter Q8H   Continuous Infusions:  piperacillin -tazobactam (ZOSYN )  IV 3.375 g (06/27/24 1108)      LOS: 2 days   Brooke [name]  [facility]  06/27/2024, 3:03 PM

## 2024-06-27 NOTE — Progress Notes (Signed)
 Progress Note     Interval: IR drain placed  Objective: Vital signs in last 24 hours: Temp:  [97.7 F (36.5 C)-98.6 F (37 C)] 98.6 F (37 C) (12/06 0525) Pulse Rate:  [73-85] 73 (12/06 0525) Resp:  [10-18] 16 (12/06 0525) BP: (112-158)/(56-71) 120/56 (12/06 0525) SpO2:  [92 %-100 %] 92 % (12/06 0817) Weight:  [85.1 kg] 85.1 kg (12/06 0500) Last BM Date : 06/25/24  Intake/Output from previous day: 12/05 0701 - 12/06 0700 In: 740 [P.O.:730] Out: 355 [Urine:250; Drains:105] Intake/Output this shift: Total I/O In: 240 [P.O.:240] Out: -   PE: General: Pleasant female who is laying in bed in NAD. HEENT: Head is normocephalic, atraumatic. Sclera are noninjected.  Heart: Normal HR during encounter.  Lungs: Respiratory effort nonlabored. Abd: Soft, tenderness around abdominal wound, packing in place. Drain with old sanguinous drainage MS: Able to move all 4 extremities. Skin: Warm and dry.  Psych: A&Ox3 with an appropriate affect.    Lab Results:  Recent Labs    06/25/24 2007 06/26/24 0447  WBC 12.9* 9.1  HGB 11.1* 9.0*  HCT 34.1* 28.8*  PLT 597* 357   BMET Recent Labs    06/26/24 0447 06/27/24 0540  NA 140 142  K 2.9* 3.8  CL 111 113*  CO2 17* 20*  GLUCOSE 71 74  BUN 7* 7*  CREATININE 1.13* 1.25*  CALCIUM  7.5* 8.1*   PT/INR No results for input(s): LABPROT, INR in the last 72 hours. CMP     Component Value Date/Time   NA 142 06/27/2024 0540   NA 143 06/01/2024 0958   K 3.8 06/27/2024 0540   CL 113 (H) 06/27/2024 0540   CO2 20 (L) 06/27/2024 0540   GLUCOSE 74 06/27/2024 0540   GLUCOSE 96 05/27/2006 1052   BUN 7 (L) 06/27/2024 0540   BUN 16 06/01/2024 0958   CREATININE 1.25 (H) 06/27/2024 0540   CREATININE 0.90 01/01/2014 1046   CALCIUM  8.1 (L) 06/27/2024 0540   PROT 4.7 (L) 06/26/2024 0447   PROT 4.8 (L) 06/01/2024 0958   ALBUMIN  2.2 (L) 06/27/2024 0540   ALBUMIN  2.5 (L) 06/01/2024 0958   AST 62 (H) 06/26/2024 0447   ALT 45 (H)  06/26/2024 0447   ALKPHOS 128 (H) 06/26/2024 0447   BILITOT 0.2 06/26/2024 0447   BILITOT 0.2 06/01/2024 0958   GFRNONAA 46 (L) 06/27/2024 0540   GFRAA 39 (L) 09/24/2018 1054   Lipase     Component Value Date/Time   LIPASE <10 (L) 06/07/2024 1350       Studies/Results: CT GUIDED PERITONEAL/RETROPERITONEAL FLUID DRAIN BY PERC CATH Result Date: 06/26/2024 INDICATION: [id number] Intra-abdominal abscess (HCC) [id number] EXAM: CT-GUIDED RIGHT UPPER QUADRANT ABSCESS DRAINAGE CATHETER PLACEMENT COMPARISON:  CT AP, 06/25/2024. MEDICATIONS: The patient is currently admitted to the hospital and receiving intravenous antibiotics. The antibiotics were administered within an appropriate time frame prior to the initiation of the procedure. ANESTHESIA/SEDATION: Moderate (conscious) sedation was employed during this procedure. A total of Versed  2 mg and Fentanyl  100 mcg was administered intravenously. Moderate Sedation Time: 26 minutes. The patient's level of consciousness and vital signs were monitored continuously by radiology nursing throughout the procedure under my direct supervision. CONTRAST:  None FLUOROSCOPY TIME:  CT dose; 792 mGycm COMPLICATIONS: None immediate. PROCEDURE: RADIATION DOSE REDUCTION: This exam was performed according to the departmental dose-optimization program which includes automated exposure control, adjustment of the mA and/or kV according to patient size and/or use of iterative reconstruction technique. Informed written consent was obtained  from the patient after a discussion of the risks, benefits and alternatives to treatment. The patient was placed supine on the CT gantry and a pre procedural CT was performed re-demonstrating the known abscess/fluid collection within the RIGHT upper quadrant. The procedure was planned. A timeout was performed prior to the initiation of the procedure. The RIGHT upper quadrant was prepped and draped in the usual sterile fashion. The overlying soft tissues  were anesthetized with 1% lidocaine  with epinephrine . Appropriate trajectory was planned with the use of a 22 gauge spinal needle. An 18 gauge trocar needle was advanced into the abscess/fluid collection and a short Amplatz super stiff wire was coiled within the collection. Appropriate positioning was confirmed with a limited CT scan. The tract was serially dilated allowing placement of a 14 Fr drainage catheter. Appropriate positioning was confirmed with a limited postprocedural CT scan. 5 mL of dark sanguinous fluid was aspirated. The tube was connected to a bulb suction and sutured in place. A dressing was placed. The patient tolerated the procedure well without immediate post procedural complication. IMPRESSION: Successful CT-guided placement of a 14 Fr drainage catheter into the RIGHT upper quadrant Aspiration of 5 mL of dark sanguinous fluid, suspicious for hematoma. Samples were sent to the laboratory as requested by the ordering clinical team. RECOMMENDATIONS: The patient will return to V[facility]iology (VIR) for routine drainage catheter evaluation in 7-10 days. Thom Hall, MD [facility] Electronically Signed   By: Thom Hall M.D.   On: 06/26/2024 17:35   CT ABDOMEN PELVIS W CONTRAST Result Date: 06/25/2024 CLINICAL DATA:  Generalized abdominal pain. Status post recent laparoscopic partial colectomy. EXAM: CT ABDOMEN AND PELVIS WITH CONTRAST TECHNIQUE: Multidetector CT imaging of the abdomen and pelvis was performed using the standard protocol following bolus administration of intravenous contrast. RADIATION DOSE REDUCTION: This exam was performed according to the departmental dose-optimization program which includes automated exposure control, adjustment of the mA and/or kV according to patient size and/or use of iterative reconstruction technique. CONTRAST:  OMNIPAQUE  IOHEXOL  300 MG/ML  SOLN COMPARISON:  Preoperative  CT 06/07/2024 FINDINGS: Lower chest: Trace bilateral pleural effusions, right greater than left. Patulous distal esophagus. Hepatobiliary: Multiple cysts in the liver, stable from prior exam. Stable biliary dilatation post cholecystectomy. Pancreas: Fatty atrophy.  No ductal dilatation or inflammation. Spleen: Heterogeneous enhancement without discrete lesion. Cleft posteriorly. Small amount of perisplenic fluid is again seen, simple in density. Adrenals/Urinary Tract: No adrenal nodule. No hydronephrosis or renal calculi. Low-density lesion arising from the upper left kidney measuring 2.7 cm, unchanged from prior exams. Unremarkable urinary bladder. Stomach/Bowel: Right hemicolectomy. Heterogeneous air-fluid collection in the right upper quadrant posterior to the surgical anastomosis measures 7.5 x 7.1 cm, without internal contrast. This has surrounding edema and soft tissue stranding. Collection abuts the inferior right lobe of the liver, small bowel loops in the right abdomen as well as lateral aspect of the duodenum. Administered enteric contrast reaches the colon. No bowel obstruction. No small bowel ileus. Colonic diverticulosis without diverticulitis. Vascular/Lymphatic: Aortic atherosclerosis. No aortic aneurysm. The portal vein is patent. Shotty retroperitoneal and central mesenteric nodes are likely reactive in this setting. Prominent 8 mm short axis lymph nodes in the low pelvis, series 2, image 71, slightly increased in size from prior. Reproductive: Status post hysterectomy. No adnexal masses. Other: Heterogeneous right upper quadrant fluid collection as described. Adjacent stranding and small amount of free fluid extends to the inferior aspect of the liver. Minimal pockets of  free fluid in the mesentery without additional organized collection. Small amount of perisplenic fluid was present on prior exam. Postsurgical change of the midline anterior abdominal wall. Small foci of gas in the anterior  abdominal wall above the umbilicus likely related to prior laparoscopic port site. There is generalized subcutaneous edema and fat stranding about the anterior abdominal wall, as well as both lateral flanks. No organized subcutaneous collection. Probable sebaceous cyst in the right gluteal soft tissues. Musculoskeletal: There are no acute or suspicious osseous abnormalities. No intramuscular collection. IMPRESSION: 1. Recent right hemicolectomy. Heterogeneous air-fluid collection in the right upper quadrant measures 7.5 x 7.1 cm, consistent with abscess. This has surrounding edema and soft tissue stranding. No internal enteric contrast to suggest operative leak. 2. Trace bilateral pleural effusions, right greater than left. 3. Generalized subcutaneous edema and fat stranding about the anterior abdominal wall, as well as both lateral flanks. No organized subcutaneous collection. 4. Prominent 8 mm short axis lymph nodes in the low pelvis, slightly increased in size from prior exam, likely reactive. Aortic Atherosclerosis (ICD10-I70.0). These results will be called to the ordering clinician or representative by the Radiologist Assistant, and communication documented in the PACS or [employer]. Electronically Signed   By: Andrea Gasman M.D.   On: 06/25/2024 18:29    Anti-infectives: Anti-infectives (From admission, onward)    Start     Dose/Rate Route Frequency Ordered Stop   06/26/24 0400  piperacillin -tazobactam (ZOSYN ) IVPB 3.375 g        3.375 g 12.5 mL/hr over 240 Minutes Intravenous Every 8 hours 06/25/24 2246     06/25/24 2115  piperacillin -tazobactam (ZOSYN ) IVPB 3.375 g        3.375 g 100 mL/hr over 30 Minutes Intravenous  Once 06/25/24 2102 06/25/24 2150        Assessment/Plan S/P Laparoscopic assisted Right colectomy for colon cancer on 06/12/24 by Dr. Tanda. Returned after outpatient visit where hematoma was drained from abdominal wound and staples removed. - CT from 12/4 showed  concerns of abscess, drain placed by IR - CBC ordered. Drop from 11.1 to 9.0 yesterday, will ensure stable before considering restarting DVT ppx - Continue wet-to-dry dressing changes BID or when soiled. -Continue antibiotics.  -Will continue to follow.   FEN: Regular diet already ordered by primary; IVF per primary team VTE: SCDs, ok for DVT ppx from surgical perspective ID: [email or web address]      LOS: 2 days    I reviewed specialist notes, consulting provider notes, hospitalist notes, nursing notes, last 24 h vitals and pain scores, last 48 h intake and output, last 24 h labs and trends, and last 24 h imaging results.   Richerd Silversmith, MD  [facility] 06/27/2024, 10:10 AM Please see Amion for pager number during day hours 7:00am-4:30pm

## 2024-06-27 NOTE — Progress Notes (Signed)
 Referring Physician(s): Blackman,D  Supervising Physician: Hughes Simmonds  Patient Status:  [facility] - In-pt  Chief Complaint: Abdominal/back pain; postop abdominal abscess ; s/p right upper abdominal abscess vs hematoma drain placement 12/5   Subjective: Pt doing ok; c/o some soreness at RUQ drain site; denies fever,N/V   Allergies: Gnp glp-1 daily support [germanium], Metformin  and related, Ace inhibitors, Codeine, Hydrocodone , Tramadol , and Tylenol  [acetaminophen ]  Medications: Prior to Admission medications   Medication Sig Start Date End Date Taking? Authorizing Provider  albuterol  (VENTOLIN  HFA) 108 (90 Base) MCG/ACT inhaler Inhale 2 puffs into the lungs every 4 (four) hours as needed for wheezing or shortness of breath. 03/05/24  Yes Jeneal Danita Macintosh, MD  allopurinol  (ZYLOPRIM ) 100 MG tablet TAKE 1 TABLET BY MOUTH EVERY DAY 05/06/24  Yes Norleen Lynwood ORN, MD  budesonide -glycopyrrolate -formoterol  (BREZTRI  AEROSPHERE) 160-9-4.8 MCG/ACT AERO inhaler Inhale into the lungs.   Yes [provider]  cetirizine  (ZYRTEC ) 10 MG tablet TAKE 1 TABLET BY MOUTH EVERY DAY 05/05/24  Yes Padgett, Danita Macintosh, MD  citalopram  (CELEXA ) 40 MG tablet TAKE 1 TABLET BY MOUTH EVERY DAY 03/06/24  Yes Norleen Lynwood ORN, MD  clonazePAM  (KLONOPIN ) 0.5 MG tablet Take 1 tablet (0.5 mg total) by mouth daily as needed (vertigo). 02/25/23  Yes Norleen Lynwood ORN, MD  colchicine  0.6 MG tablet TAKE 0.5 TABLETS (0.3 MG TOTAL) BY MOUTH DAILY AS NEEDED (GOUT OR PSUEDOGOUT PAIN). Patient taking differently: Take 0.3 mg by mouth daily as needed (gout pain). 10/01/22  Yes Joane Artist RAMAN, MD  diltiazem  (CARDIZEM  CD) 180 MG 24 hr capsule Take 1 capsule (180 mg total) by mouth daily. 06/01/24 08/30/24 Yes Turner, Wilbert SAUNDERS, MD  empagliflozin  (JARDIANCE ) 25 MG TABS tablet Take 25 mg by mouth daily.   Yes [provider]  erythromycin  ophthalmic ointment Place 1 Application into the left eye at bedtime. 05/29/24  Yes  [provider]  metoprolol  succinate (TOPROL -XL) 100 MG 24 hr tablet Take 1 tablet (100 mg total) by mouth daily. Take with or immediately following a meal. Patient taking differently: Take 100 mg by mouth in the morning and at bedtime. 06/22/24  Yes Cherlyn Labella, MD  rosuvastatin  (CRESTOR ) 40 MG tablet TAKE 1 TABLET BY MOUTH EVERY DAY 04/22/24  Yes Turner, Wilbert SAUNDERS, MD  atropine  1 % ophthalmic solution Place 1 drop into the left eye daily. 01/16/24   [provider]  Azelastine  HCl 137 MCG/SPRAY SOLN PLACE 2 SPRAYS INTO BOTH NOSTRILS 2 (TWO) TIMES DAILY. USE IN [facility] NOSTRIL AS DIRECTED 04/03/24   Norleen Lynwood ORN, MD  brimonidine  (ALPHAGAN ) 0.2 % ophthalmic solution 3 (three) times daily. 1 drop in the left eye three times daily    [provider]  famotidine  (PEPCID ) 20 MG tablet Take 1 tablet (20 mg total) by mouth 2 (two) times daily. 03/05/24   Jeneal Danita Macintosh, MD  Fluticasone -Umeclidin-Vilant (TRELEGY ELLIPTA ) 200-62.5-25 MCG/ACT AEPB Inhale 1 puff into the lungs daily. 03/05/24   Jeneal Danita Macintosh, MD  hydrochlorothiazide  (MICROZIDE ) 12.5 MG capsule  03/15/24   [provider]  latanoprost  (XALATAN ) 0.005 % ophthalmic solution Place 1 drop into the left eye at bedtime.    [provider]  levocetirizine (XYZAL ) 5 MG tablet Take 1 tablet (5 mg total) by mouth daily. 03/05/24   Jeneal Danita Macintosh, MD  meclizine  (ANTIVERT ) 25 MG tablet TAKE 1 TABLET BY MOUTH 3 TIMES A DAY AS NEEDED FOR DIZZINESS 11/04/23   Norleen Lynwood ORN, MD  ondansetron  (ZOFRAN -ODT)  4 MG disintegrating tablet Take 1 tablet (4 mg total) by mouth every 8 (eight) hours as needed for nausea or vomiting. 04/16/24   Rai, Ripudeep K, MD  pantoprazole  (PROTONIX ) 40 MG tablet Take 1 tablet (40 mg total) by mouth daily. 06/22/24   Akula, Vijaya, MD  potassium chloride  (KLOR-CON  10) 10 MEQ tablet Take 1 tablet (10 mEq total) by mouth daily for 5 days. 06/02/24 06/22/24  Norleen Lynwood ORN,  MD  sodium bicarbonate  650 MG tablet Take 2 tablets (1,300 mg total) by mouth 2 (two) times daily. Patient taking differently: Take 1,300 mg by mouth 2 (two) times daily. 06/22/24: Reports during TOC call currently taking once every day 05/17/24   Sebastian Toribio GAILS, MD  traZODone  (DESYREL ) 100 MG tablet TAKE 1 TABLET (100 MG TOTAL) BY MOUTH AT BEDTIME AS NEEDED. FOR SLEEP 03/03/24   Norleen Lynwood ORN, MD     Vital Signs: BP 139/61 (BP Location: Left Arm)   Pulse 73   Temp 98.3 F (36.8 C) (Oral)   Resp 16   Ht 5' 3 (1.6 m)   Wt 187 lb 9.8 oz (85.1 kg)   SpO2 99%   BMI 33.23 kg/m   Physical Exam: awake/alert; RUQ drain intact, insertion site ok, mildly tender, OP 105 cc yesterday, 10 cc today bloody fluid; drain flushed with minimal return  Imaging: CT GUIDED PERITONEAL/RETROPERITONEAL FLUID DRAIN BY PERC CATH Result Date: 06/26/2024 INDICATION: [id number] Intra-abdominal abscess (HCC) [id number] EXAM: CT-GUIDED RIGHT UPPER QUADRANT ABSCESS DRAINAGE CATHETER PLACEMENT COMPARISON:  CT AP, 06/25/2024. MEDICATIONS: The patient is currently admitted to the hospital and receiving intravenous antibiotics. The antibiotics were administered within an appropriate time frame prior to the initiation of the procedure. ANESTHESIA/SEDATION: Moderate (conscious) sedation was employed during this procedure. A total of Versed  2 mg and Fentanyl  100 mcg was administered intravenously. Moderate Sedation Time: 26 minutes. The patient's level of consciousness and vital signs were monitored continuously by radiology nursing throughout the procedure under my direct supervision. CONTRAST:  None FLUOROSCOPY TIME:  CT dose; 792 mGycm COMPLICATIONS: None immediate. PROCEDURE: RADIATION DOSE REDUCTION: This exam was performed according to the departmental dose-optimization program which includes automated exposure control, adjustment of the mA and/or kV according to patient size and/or use of iterative reconstruction technique.  Informed written consent was obtained from the patient after a discussion of the risks, benefits and alternatives to treatment. The patient was placed supine on the CT gantry and a pre procedural CT was performed re-demonstrating the known abscess/fluid collection within the RIGHT upper quadrant. The procedure was planned. A timeout was performed prior to the initiation of the procedure. The RIGHT upper quadrant was prepped and draped in the usual sterile fashion. The overlying soft tissues were anesthetized with 1% lidocaine  with epinephrine . Appropriate trajectory was planned with the use of a 22 gauge spinal needle. An 18 gauge trocar needle was advanced into the abscess/fluid collection and a short Amplatz super stiff wire was coiled within the collection. Appropriate positioning was confirmed with a limited CT scan. The tract was serially dilated allowing placement of a 14 Fr drainage catheter. Appropriate positioning was confirmed with a limited postprocedural CT scan. 5 mL of dark sanguinous fluid was aspirated. The tube was connected to a bulb suction and sutured in place. A dressing was placed. The patient tolerated the procedure well without immediate post procedural complication. IMPRESSION: Successful CT-guided placement of a 14 Fr drainage catheter into the RIGHT upper quadrant Aspiration of 5 mL of dark sanguinous  fluid, suspicious for hematoma. Samples were sent to the laboratory as requested by the ordering clinical team. RECOMMENDATIONS: The patient will return to Vascular Interventional Radiology (VIR) for routine drainage catheter evaluation in 7-10 days. Thom Hall, MD [facility] Electronically Signed   By: Thom Hall M.D.   On: 06/26/2024 17:35   CT ABDOMEN PELVIS W CONTRAST Result Date: 06/25/2024 CLINICAL DATA:  Generalized abdominal pain. Status post recent laparoscopic partial colectomy. EXAM: CT ABDOMEN AND PELVIS WITH  CONTRAST TECHNIQUE: Multidetector CT imaging of the abdomen and pelvis was performed using the standard protocol following bolus administration of intravenous contrast. RADIATION DOSE REDUCTION: This exam was performed according to the departmental dose-optimization program which includes automated exposure control, adjustment of the mA and/or kV according to patient size and/or use of iterative reconstruction technique. CONTRAST:  OMNIPAQUE  IOHEXOL  300 MG/ML  SOLN COMPARISON:  Preoperative CT 06/07/2024 FINDINGS: Lower chest: Trace bilateral pleural effusions, right greater than left. Patulous distal esophagus. Hepatobiliary: Multiple cysts in the liver, stable from prior exam. Stable biliary dilatation post cholecystectomy. Pancreas: Fatty atrophy.  No ductal dilatation or inflammation. Spleen: Heterogeneous enhancement without discrete lesion. Cleft posteriorly. Small amount of perisplenic fluid is again seen, simple in density. Adrenals/Urinary Tract: No adrenal nodule. No hydronephrosis or renal calculi. Low-density lesion arising from the upper left kidney measuring 2.7 cm, unchanged from prior exams. Unremarkable urinary bladder. Stomach/Bowel: Right hemicolectomy. Heterogeneous air-fluid collection in the right upper quadrant posterior to the surgical anastomosis measures 7.5 x 7.1 cm, without internal contrast. This has surrounding edema and soft tissue stranding. Collection abuts the inferior right lobe of the liver, small bowel loops in the right abdomen as well as lateral aspect of the duodenum. Administered enteric contrast reaches the colon. No bowel obstruction. No small bowel ileus. Colonic diverticulosis without diverticulitis. Vascular/Lymphatic: Aortic atherosclerosis. No aortic aneurysm. The portal vein is patent. Shotty retroperitoneal and central mesenteric nodes are likely reactive in this setting. Prominent 8 mm short axis lymph nodes in the low pelvis, series 2, image 71, slightly  increased in size from prior. Reproductive: Status post hysterectomy. No adnexal masses. Other: Heterogeneous right upper quadrant fluid collection as described. Adjacent stranding and small amount of free fluid extends to the inferior aspect of the liver. Minimal pockets of free fluid in the mesentery without additional organized collection. Small amount of perisplenic fluid was present on prior exam. Postsurgical change of the midline anterior abdominal wall. Small foci of gas in the anterior abdominal wall above the umbilicus likely related to prior laparoscopic port site. There is generalized subcutaneous edema and fat stranding about the anterior abdominal wall, as well as both lateral flanks. No organized subcutaneous collection. Probable sebaceous cyst in the right gluteal soft tissues. Musculoskeletal: There are no acute or suspicious osseous abnormalities. No intramuscular collection. IMPRESSION: 1. Recent right hemicolectomy. Heterogeneous air-fluid collection in the right upper quadrant measures 7.5 x 7.1 cm, consistent with abscess. This has surrounding edema and soft tissue stranding. No internal enteric contrast to suggest operative leak. 2. Trace bilateral pleural effusions, right greater than left. 3. Generalized subcutaneous edema and fat stranding about the anterior abdominal wall, as well as both lateral flanks. No organized subcutaneous collection. 4. Prominent 8 mm short axis lymph nodes in the low pelvis, slightly increased in size from prior exam, likely reactive. Aortic Atherosclerosis (ICD10-I70.0). These results will be called to the ordering clinician or representative by the Radiologist Assistant, and communication documented in the PACS or  [facility] Dashboard. Electronically Signed   By: Andrea Gasman M.D.   On: 06/25/2024 18:29    Labs:  CBC: Recent Labs    06/21/24 0312 06/25/24 2007 06/26/24 0447 06/27/24 1138  WBC 11.9* 12.9* 9.1 8.5  HGB 9.0* 11.1* 9.0* 10.4*  HCT  27.7* 34.1* 28.8* 31.5*  PLT 314 597* 357 528*    COAGS: Recent Labs    06/08/24 0404  INR 1.2    BMP: Recent Labs    06/21/24 0312 06/25/24 2007 06/26/24 0447 06/27/24 0540  NA 140 139 140 142  K 3.6 3.2* 2.9* 3.8  CL 111 109 111 113*  CO2 20* 19* 17* 20*  GLUCOSE 99 78 71 74  BUN 12 7* 7* 7*  CALCIUM  8.0* 8.4* 7.5* 8.1*  CREATININE 1.06* 1.23* 1.13* 1.25*  GFRNONAA 56* 46* 51* 46*    LIVER FUNCTION TESTS: Recent Labs    06/15/24 0249 06/18/24 0431 06/25/24 2007 06/26/24 0447 06/27/24 0540  BILITOT 0.2 0.3 0.3 0.2  --   AST 18 48* 42* 62*  --   ALT 14 31 53* 45*  --   ALKPHOS 68 62 133* 128*  --   PROT 4.7* 4.8* 6.2* 4.7*  --   ALBUMIN  2.2* 2.0* 2.6* 2.1* 2.2*    Assessment and Plan: 73 y.o. female with past medical history significant for anxiety/depression, aortic atherosclerosis, asthma, chronic kidney disease, eczema, anemia, GERD, gout, colon polyps, hyperlipidemia, hypertension, hypothyroidism, osteoarthritis, PVCs, thyroid  goiter, diabetes who was recently diagnosed with stage II adenocarcinoma of the right colon, status post right colectomy on 06/12/24.  Patient was discharged home on 11/30 and reported to the surgical office recently for staple removal where hematoma was drained and packed at the midline.  Subsequent CT scan revealed a 7 x 7 cm right upper quadrant abscess posterior to the bowel anastomosis.  No leak of contrast was seen; s/p drainage of RUQ abscess vs hematoma 12/5 (14 fr to JP); afebrile, WBC nl, hgb stable, drain fl cx pend; cont current tx, lab checks, check final cx results; once drain output minimal obtain f/u CT; other plans as per CCS/TRH   Electronically Signed: D. Franky Rakers, PA-C 06/27/2024, 2:32 PM   I spent a total of 15 Minutes at the the patient's bedside AND on the patient's hospital floor or unit, greater than 50% of which was counseling/coordinating care for right upper abdominal fluid collection drain    Patient  ID: Brooke [name], female   DOB: Dec 15, 1950, 73 y.o.   MRN: [id number]

## 2024-06-27 NOTE — Plan of Care (Signed)

## 2024-06-28 DIAGNOSIS — K651 Peritoneal abscess: Secondary | ICD-10-CM | POA: Diagnosis not present

## 2024-06-28 DIAGNOSIS — I10 Essential (primary) hypertension: Secondary | ICD-10-CM | POA: Diagnosis not present

## 2024-06-28 DIAGNOSIS — T8143XA Infection following a procedure, organ and space surgical site, initial encounter: Secondary | ICD-10-CM | POA: Diagnosis not present

## 2024-06-28 LAB — CBC
HCT: 28.4 % — ABNORMAL LOW (ref 36.0–46.0)
Hemoglobin: 9.2 g/dL — ABNORMAL LOW (ref 12.0–15.0)
MCH: 28 pg (ref 26.0–34.0)
MCHC: 32.4 g/dL (ref 30.0–36.0)
MCV: 86.6 fL (ref 80.0–100.0)
Platelets: 467 K/uL — ABNORMAL HIGH (ref 150–400)
RBC: 3.28 MIL/uL — ABNORMAL LOW (ref 3.87–5.11)
RDW: 16 % — ABNORMAL HIGH (ref 11.5–15.5)
WBC: 8 K/uL (ref 4.0–10.5)
nRBC: 0 % (ref 0.0–0.2)

## 2024-06-28 NOTE — Plan of Care (Signed)

## 2024-06-28 NOTE — Progress Notes (Signed)
 Progress Note     Interval: No acute changes. Feels well this morning. Afebrile, WBC normal. Drain cultures with no organisms.   Objective: Vital signs in last 24 hours: Temp:  [98.1 F (36.7 C)-98.3 F (36.8 C)] 98.1 F (36.7 C) (12/07 0557) Pulse Rate:  [69-76] 76 (12/07 0557) Resp:  [16-18] 17 (12/07 0557) BP: (117-139)/(58-61) 129/58 (12/07 0557) SpO2:  [94 %-99 %] 94 % (12/07 0805) Last BM Date : 06/28/24  Intake/Output from previous day: 12/06 0701 - 12/07 0700 In: 1325 [P.O.:1320] Out: 330 [Urine:300; Drains:30] Intake/Output this shift: No intake/output data recorded.  PE: General: resting comfortably, NAD Lungs: Respiratory effort nonlabored. Abd: Soft, tenderness around abdominal wound, packing in place. Drain with old sanguinous drainage. Skin: Warm and dry.  Psych: A&Ox3 with an appropriate affect.    Lab Results:  Recent Labs    06/27/24 1138 06/28/24 0449  WBC 8.5 8.0  HGB 10.4* 9.2*  HCT 31.5* 28.4*  PLT 528* 467*   BMET Recent Labs    06/26/24 0447 06/27/24 0540  NA 140 142  K 2.9* 3.8  CL 111 113*  CO2 17* 20*  GLUCOSE 71 74  BUN 7* 7*  CREATININE 1.13* 1.25*  CALCIUM  7.5* 8.1*   PT/INR No results for input(s): LABPROT, INR in the last 72 hours. CMP     Component Value Date/Time   NA 142 06/27/2024 0540   NA 143 06/01/2024 0958   K 3.8 06/27/2024 0540   CL 113 (H) 06/27/2024 0540   CO2 20 (L) 06/27/2024 0540   GLUCOSE 74 06/27/2024 0540   GLUCOSE 96 05/27/2006 1052   BUN 7 (L) 06/27/2024 0540   BUN 16 06/01/2024 0958   CREATININE 1.25 (H) 06/27/2024 0540   CREATININE 0.90 01/01/2014 1046   CALCIUM  8.1 (L) 06/27/2024 0540   PROT 4.7 (L) 06/26/2024 0447   PROT 4.8 (L) 06/01/2024 0958   ALBUMIN  2.2 (L) 06/27/2024 0540   ALBUMIN  2.5 (L) 06/01/2024 0958   AST 62 (H) 06/26/2024 0447   ALT 45 (H) 06/26/2024 0447   ALKPHOS 128 (H) 06/26/2024 0447   BILITOT 0.2 06/26/2024 0447   BILITOT 0.2 06/01/2024 0958   GFRNONAA  46 (L) 06/27/2024 0540   GFRAA 39 (L) 09/24/2018 1054   Lipase     Component Value Date/Time   LIPASE <10 (L) 06/07/2024 1350       Studies/Results: CT GUIDED PERITONEAL/RETROPERITONEAL FLUID DRAIN BY PERC CATH Result Date: 06/26/2024 INDICATION: [id number] Intra-abdominal abscess (HCC) [id number] EXAM: CT-GUIDED RIGHT UPPER QUADRANT ABSCESS DRAINAGE CATHETER PLACEMENT COMPARISON:  CT AP, 06/25/2024. MEDICATIONS: The patient is currently admitted to the hospital and receiving intravenous antibiotics. The antibiotics were administered within an appropriate time frame prior to the initiation of the procedure. ANESTHESIA/SEDATION: Moderate (conscious) sedation was employed during this procedure. A total of Versed  2 mg and Fentanyl  100 mcg was administered intravenously. Moderate Sedation Time: 26 minutes. The patient's level of consciousness and vital signs were monitored continuously by radiology nursing throughout the procedure under my direct supervision. CONTRAST:  None FLUOROSCOPY TIME:  CT dose; 792 mGycm COMPLICATIONS: None immediate. PROCEDURE: RADIATION DOSE REDUCTION: This exam was performed according to the departmental dose-optimization program which includes automated exposure control, adjustment of the mA and/or kV according to patient size and/or use of iterative reconstruction technique. Informed written consent was obtained from the patient after a discussion of the risks, benefits and alternatives to treatment. The patient was placed supine on the CT gantry and a pre procedural  CT was performed re-demonstrating the known abscess/fluid collection within the RIGHT upper quadrant. The procedure was planned. A timeout was performed prior to the initiation of the procedure. The RIGHT upper quadrant was prepped and draped in the usual sterile fashion. The overlying soft tissues were anesthetized with 1% lidocaine  with epinephrine . Appropriate trajectory was planned with the use of a 22 gauge spinal  needle. An 18 gauge trocar needle was advanced into the abscess/fluid collection and a short Amplatz super stiff wire was coiled within the collection. Appropriate positioning was confirmed with a limited CT scan. The tract was serially dilated allowing placement of a 14 Fr drainage catheter. Appropriate positioning was confirmed with a limited postprocedural CT scan. 5 mL of dark sanguinous fluid was aspirated. The tube was connected to a bulb suction and sutured in place. A dressing was placed. The patient tolerated the procedure well without immediate post procedural complication. IMPRESSION: Successful CT-guided placement of a 14 Fr drainage catheter into the RIGHT upper quadrant Aspiration of 5 mL of dark sanguinous fluid, suspicious for hematoma. Samples were sent to the laboratory as requested by the ordering clinical team. RECOMMENDATIONS: The patient will return to Vascular Interventional Radiology (VIR) for routine drainage catheter evaluation in 7-10 days. Thom Hall, MD [facility] Radiology Electronically Signed   By: Thom Hall M.D.   On: 06/26/2024 17:35    Anti-infectives: Anti-infectives (From admission, onward)    Start     Dose/Rate Route Frequency Ordered Stop   06/26/24 0400  piperacillin -tazobactam (ZOSYN ) IVPB 3.375 g  Status:  Discontinued        3.375 g 12.5 mL/hr over 240 Minutes Intravenous Every 8 hours 06/25/24 2246 06/28/24 0903   06/25/24 2115  piperacillin -tazobactam (ZOSYN ) IVPB 3.375 g        3.375 g 100 mL/hr over 30 Minutes Intravenous  Once 06/25/24 2102 06/25/24 2150        Assessment/Plan S/P Laparoscopic assisted Right colectomy for colon cancer on 06/12/24 by Dr. Tanda. Returned after outpatient visit where hematoma was drained from abdominal wound and staples removed. - CT from 12/4 showed concerns of abscess, drain placed by IR. Output is consistent with a hematoma and drain cultures have not grown any  organisms. - Stop antibiotics given negative cultures, normal WBC and absence of fevers - Continue wet-to-dry dressing changes BID or when soiled. - From a surgical standpoint patient may be discharged home. She does not need further antibiotics. She has follow up with Dr. Tanda scheduled this week on 12/12, and will follow up with IR for drain removal.   LOS: 3 days    Leonor LITTIE Dawn, MD  [facility] 06/28/2024, 9:03 AM Please see Amion for pager number during day hours 7:00am-4:30pm

## 2024-06-28 NOTE — Discharge Instructions (Addendum)
    [facility] DISCHARGE INSTRUCTIONS  Activity No heavy lifting greater than 15 pounds until cleared by your surgeon. Ok to shower with warm water  and mild soap, but do not bathe or submerge your incision underwater. Do not drive while taking narcotic pain medication.  Wound Care Pack your wound with saline-moistened gauze. Cover with an absorbent pad or clean dry gauze, and secure with tape. Change this twice a day and more often as needed if soiled or saturated.  Medications A  prescription for pain medication may be given to you upon discharge.  Take your pain medication as prescribed, if needed.  If narcotic pain medicine is not needed, then you may take acetaminophen  (Tylenol ) or ibuprofen  (Advil ) as needed. It is common to experience some constipation if taking pain medication after surgery.  Increasing fluid intake and taking a stool softener (such as Colace) will usually help or prevent this problem from occurring.  A mild laxative (Milk of Magnesia or Miralax ) should be taken according to package directions if there are no bowel movements after 48 hours. Take your usually prescribed medications unless otherwise directed. If you need a refill on your pain medication, please contact your pharmacy.  They will contact our office to request authorization. Prescriptions will not be filled after 5 pm or on weekends.  When to Call Us : Fever greater than 100.5 New redness, drainage, or swelling at incision site Severe pain, nausea, or vomiting Persistent bleeding from incisions   Follow-up You have an appointment scheduled with Dr. Tanda this coming Friday, December 12 at 2:30pm. This will be at the [facility] office at [address], [place], [place]. Please arrive at least 15 minutes prior to your scheduled appointment time.  The clinic staff is available to answer your questions during regular business hours.  Please don't hesitate to call  and ask to speak to one of the nurses for clinical concerns.  If you have a medical emergency, go to the nearest emergency room or call 911.  A surgeon from [facility] is always on call at the hospital  [address], [place], [place]  [address] ?  [address] [address], [place], [place]   [address] [phone] ? Toll Free: [phone] ? FAX [phone] Web site: [email or web address]

## 2024-06-28 NOTE — Plan of Care (Incomplete)

## 2024-06-28 NOTE — Evaluation (Signed)
 Physical Therapy Evaluation Patient Details Name: Brooke [name] MRN: [id number] DOB: Feb 18, 1951 Today's Date: 06/28/2024  History of Present Illness  Brooke [name] is a 73 y.o. female admitted from home with post op intra-abdominal abscess and with hx of recent  LAPAROSCOPIC ASSISTED  RIGHT COLECTOMY WITH EXTENDED RESECTION OF DISTAL ILEUM 11/21. PMH: HTN, HLD, DM2, asthma, anxiety, hypothyroidism, CKD stage IIIb, GERD  Clinical Impression  Pt admitted as above and presenting with functional mobility limitations 2* generalized weakness, ambulatory balance deficits, post op pain and decreased activity tolerance.  Pt motivated to progress to dc home with family assist.        If plan is discharge home, recommend the following: A little help with bathing/dressing/bathroom;Help with stairs or ramp for entrance;Assist for transportation;Assistance with cooking/housework;A little help with walking and/or transfers   Can travel by private vehicle        Equipment Recommendations None recommended by PT  Recommendations for Other Services       Functional Status Assessment Patient has had a recent decline in their functional status and demonstrates the ability to make significant improvements in function in a reasonable and predictable amount of time.     Precautions / Restrictions Precautions Precautions: Fall Recall of Precautions/Restrictions: Intact Precaution/Restrictions Comments: recent ABD surgery; R side drain now in place Restrictions Weight Bearing Restrictions Per Provider Order: No      Mobility  Bed Mobility Overal bed mobility: Needs Assistance Bed Mobility: Supine to Sit           General bed mobility comments: Min assist with cues to complete modified roll and move to EOB sitting    Transfers Overall transfer level: Needs assistance Equipment used: Rollator (4 wheels) Transfers: Sit to/from Stand Sit to Stand: Contact guard assist, Supervision            General transfer comment: for safety    Ambulation/Gait Ambulation/Gait assistance: Contact guard assist, Supervision Gait Distance (Feet): 280 Feet Assistive device: Rollator (4 wheels) Gait Pattern/deviations: Step-through pattern, Decreased stride length Gait velocity: decr     General Gait Details: some milddyspnea with ambulation. pt reported fatigue  Stairs            Wheelchair Mobility     Tilt Bed    Modified Rankin (Stroke Patients Only)       Balance Overall balance assessment: Needs assistance Sitting-balance support: No upper extremity supported, Feet unsupported Sitting balance-Leahy Scale: Good     Standing balance support: No upper extremity supported Standing balance-Leahy Scale: Fair Standing balance comment: reliant on device beyond static standing                             Pertinent Vitals/Pain Pain Assessment Pain Assessment: 0-10 Pain Score: 2  Pain Location: abdomen Pain Intervention(s): Monitored during session, Limited activity within patient's tolerance    Home Living Family/patient expects to be discharged to:: Private residence Living Arrangements: Children Available Help at Discharge: Family;Available 24 hours/day Type of Home: House Home Access: Stairs to enter Entrance Stairs-Rails: Right;Left;Can reach both Entrance Stairs-Number of Steps: 2   Home Layout: One level Home Equipment: Rollator (4 wheels);Cane - single point;Hand held shower head Additional Comments: 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    Prior Function Prior Level of Function : Independent/Modified Independent             Mobility Comments: pt reports using rollator in the home and  for appointments ADLs Comments: pt reports DIL completes household chores and provides transportation, pt reports was ind with bathing, dressing and toileting     Extremity/Trunk Assessment   Upper Extremity Assessment Upper Extremity  Assessment: Overall WFL for tasks assessed    Lower Extremity Assessment Lower Extremity Assessment: Generalized weakness       Communication   Communication Communication: No apparent difficulties    Cognition Arousal: Alert Behavior During Therapy: WFL for tasks assessed/performed   PT - Cognitive impairments: No apparent impairments                       PT - Cognition Comments: 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 Following commands: Intact       Cueing Cueing Techniques: Verbal cues     General Comments      Exercises     Assessment/Plan    PT Assessment Patient needs continued PT services  PT Problem List Decreased strength;Decreased activity tolerance;Decreased balance;Decreased mobility;Decreased knowledge of use of DME;Decreased safety awareness;Decreased knowledge of precautions;Pain       PT Treatment Interventions DME instruction;Therapeutic activities;Gait training;Functional mobility training;Therapeutic exercise;Patient/family education;Balance training    PT Goals (Current goals can be found in the Care Plan section)  Acute Rehab PT Goals Patient Stated Goal: Regain IND PT Goal Formulation: With patient Time For Goal Achievement: 07/12/24 Potential to Achieve Goals: Good    Frequency Min 2X/week     Co-evaluation               AM-PAC PT 6 Clicks Mobility  Outcome Measure Help needed turning from your back to your side while in a flat bed without using bedrails?: A Little Help needed moving from lying on your back to sitting on the side of a flat bed without using bedrails?: A Little Help needed moving to and from a bed to a chair (including a wheelchair)?: A Little Help needed standing up from a chair using your arms (e.g., wheelchair or bedside chair)?: A Little Help needed to walk in hospital room?: A Little Help needed climbing 3-5 steps with a railing? : A Little 6 Click Score: 18    End of Session  Equipment Utilized During Treatment: Gait belt Activity Tolerance: Patient tolerated treatment well Patient left: in chair;with call bell/phone within reach;with family/visitor present Nurse Communication: Mobility status PT Visit Diagnosis: Muscle weakness (generalized) (M62.81);Difficulty in walking, not elsewhere classified (R26.2);Pain    Time: 8672-8658 PT Time Calculation (min) (ACUTE ONLY): 14 min   Charges:     PT Treatments $Gait Training: 8-22 mins PT General Charges $$ ACUTE PT VISIT: 1 Visit         [facility] PT Acute Rehabilitation Services Office [phone]   Brooke [name] 06/28/2024, 1:51 PM

## 2024-06-28 NOTE — Progress Notes (Addendum)
 [facility] PROGRESS NOTE    Progress Note  BRYN SALINE  FMW:[id number] DOB: 09/26/1950 DOA: 06/25/2024 PCP: Norleen Lynwood ORN, MD     Brief Narrative:   Brooke [name] is an 73 y.o. female past medical history significant for asthma, chronic kidney disease stage IIIa, essential hypertension depression anxiety recently diagnosed with stage II adenocarcinoma of the right colon status post right colectomy in June 12, 2024 comes back to the ED for a postoperative intra-abdominal abscess.  CT scan done showed a right upper quadrant 7.5 x 7.1 cm probable abscess with surrounding edema and tissue stranding. Assessment/Plan:   Postoperative intra-abdominal abscess (HCC) CT scan of the abdomen pelvis showed an abscess in the right upper quadrant. General surgery was consulted, status post drain placement on 08/28/2023.  Drain output has decreased to 30 cc.  IR to determine length of drain. Remains afebrile, with no leukocytosis. Continue IV Zosyn , further management per surgery.  Electrolyte imbalance hypomagnesemia/hypokalemia: Replete, now improve. Magnesium  has improved.  Normocytic anemia: Hemoglobin appears to be stable.  Chronic kidney disease stage IIIa: Creatinine appears to be at baseline.  Cancer of ascending colon pT3pN0 s/p right colectomy 06/12/2024: Continues to follow-up with Dr. Lanny for adjuvant therapy as an outpatient.  Essential hypertension: Continue metoprolol  and diltiazem .  Asthma: Currently stable continue inhalers.  Hyperlipidemia: Holding statin in the setting of mild ovation LFT's.  Depression/anxiety: Continue Celexa .   DVT prophylaxis: lovenox  Family Communication:none Status is: Inpatient Remains inpatient appropriate because: Abscess drainage    Code Status:     Code Status Orders  (From admission, onward)           Start     Ordered   06/25/24 2222  Full code  Continuous       Question:  By:  Answer:  Consent: discussion  documented in EHR   06/25/24 2221           Code Status History     Date Active Date Inactive Code Status Order ID Comments User Context   06/07/2024 2001 06/21/2024 2048 Full Code [id number]  Waddell Rake, MD Inpatient   05/12/2024 2133 05/17/2024 2011 Full Code [id number]  Marcene Eva NOVAK, DO ED   04/29/2024 1727 05/05/2024 2107 Full Code [id number]  Barbarann Nest, MD Inpatient   04/15/2024 0456 04/16/2024 1550 Full Code [id number]  Franky Redia SAILOR, MD Inpatient   02/18/2024 2115 02/21/2024 1851 Full Code [id number]  Franky Redia SAILOR, MD ED   09/30/2018 1243 10/02/2018 1807 Full Code [id number]  Lenis Prentice RIGGERS Inpatient   05/05/2018 1237 05/08/2018 1442 Full Code [id number]  Liam Lerner, MD Inpatient   01/07/2014 1427 01/07/2014 2210 Full Code [id number]  Jordan, Peter M, MD Inpatient         IV Access:   Peripheral IV   Procedures and diagnostic studies:   CT GUIDED PERITONEAL/RETROPERITONEAL FLUID DRAIN BY PERC CATH Result Date: 06/26/2024 INDICATION: [id number] Intra-abdominal abscess (HCC) [id number] EXAM: CT-GUIDED RIGHT UPPER QUADRANT ABSCESS DRAINAGE CATHETER PLACEMENT COMPARISON:  CT AP, 06/25/2024. MEDICATIONS: The patient is currently admitted to the hospital and receiving intravenous antibiotics. The antibiotics were administered within an appropriate time frame prior to the initiation of the procedure. ANESTHESIA/SEDATION: Moderate (conscious) sedation was employed during this procedure. A total of Versed  2 mg and Fentanyl  100 mcg was administered intravenously. Moderate Sedation Time: 26 minutes. The patient's level of consciousness and vital signs were monitored continuously by radiology nursing throughout the procedure under my direct supervision. CONTRAST:  None FLUOROSCOPY TIME:  CT dose; 792 mGycm COMPLICATIONS: None immediate. PROCEDURE: RADIATION DOSE REDUCTION: This exam was performed according to the departmental dose-optimization program which includes automated  exposure control, adjustment of the mA and/or kV according to patient size and/or use of iterative reconstruction technique. Informed written consent was obtained from the patient after a discussion of the risks, benefits and alternatives to treatment. The patient was placed supine on the CT gantry and a pre procedural CT was performed re-demonstrating the known abscess/fluid collection within the RIGHT upper quadrant. The procedure was planned. A timeout was performed prior to the initiation of the procedure. The RIGHT upper quadrant was prepped and draped in the usual sterile fashion. The overlying soft tissues were anesthetized with 1% lidocaine  with epinephrine . Appropriate trajectory was planned with the use of a 22 gauge spinal needle. An 18 gauge trocar needle was advanced into the abscess/fluid collection and a short Amplatz super stiff wire was coiled within the collection. Appropriate positioning was confirmed with a limited CT scan. The tract was serially dilated allowing placement of a 14 Fr drainage catheter. Appropriate positioning was confirmed with a limited postprocedural CT scan. 5 mL of dark sanguinous fluid was aspirated. The tube was connected to a bulb suction and sutured in place. A dressing was placed. The patient tolerated the procedure well without immediate post procedural complication. IMPRESSION: Successful CT-guided placement of a 14 Fr drainage catheter into the RIGHT upper quadrant Aspiration of 5 mL of dark sanguinous fluid, suspicious for hematoma. Samples were sent to the laboratory as requested by the ordering clinical team. RECOMMENDATIONS: The patient will return to Vascular Interventional Radiology (VIR) for routine drainage catheter evaluation in 7-10 days. Thom Hall, MD [facility] Electronically Signed   By: Thom Hall M.D.   On: 06/26/2024 17:35     Medical Consultants:   None.   Subjective:    Regine  E Cillo pain is controlled tolerating her diet.  Objective:    Vitals:   06/27/24 2100 06/27/24 2148 06/28/24 0557 06/28/24 0805  BP:  (!) 117/58 (!) 129/58   Pulse:  69 76   Resp:  18 17   Temp:  98.2 F (36.8 C) 98.1 F (36.7 C)   TempSrc:  Oral Oral   SpO2: 94% 95% 94% 94%  Weight:      Height:       SpO2: 94 %   Intake/Output Summary (Last 24 hours) at 06/28/2024 0838 Last data filed at 06/28/2024 0600 Gross per 24 hour  Intake 1325 ml  Output 330 ml  Net 995 ml   Filed Weights   06/25/24 2037 06/26/24 0500 06/27/24 0500  Weight: 83.5 kg 84.8 kg 85.1 kg    Exam: General exam: In no acute distress. Respiratory system: Good air movement and clear to auscultation. Cardiovascular system: S1 & S2 heard, RRR. No JVD. Gastrointestinal system: Abdomen is nondistended, soft and nontender.  Drain in place. Extremities: No pedal edema. Skin: No rashes, lesions or ulcers Psychiatry: Judgement and insight appear normal. Mood & affect appropriate. Data Reviewed:    Labs: Basic Metabolic Panel: Recent Labs  Lab 06/25/24 2007 06/26/24 0447 06/27/24 0540  NA 139 140 142  K 3.2* 2.9* 3.8  CL 109 111 113*  CO2 19* 17* 20*  GLUCOSE 78 71 74  BUN 7* 7* 7*  CREATININE 1.23* 1.13* 1.25*  CALCIUM  8.4* 7.5* 8.1*  MG 1.6* 1.9  --   PHOS  --   --  3.0  GFR Estimated Creatinine Clearance: 42.1 mL/min (A) (by C-G formula based on SCr of 1.25 mg/dL (H)). Liver Function Tests: Recent Labs  Lab 06/25/24 2007 06/26/24 0447 06/27/24 0540  AST 42* 62*  --   ALT 53* 45*  --   ALKPHOS 133* 128*  --   BILITOT 0.3 0.2  --   PROT 6.2* 4.7*  --   ALBUMIN  2.6* 2.1* 2.2*   No results for input(s): LIPASE, AMYLASE in the last 168 hours. No results for input(s): AMMONIA in the last 168 hours. Coagulation profile No results for input(s): INR, PROTIME in the last 168 hours. COVID-19 Labs  No results for input(s): DDIMER, FERRITIN, LDH, CRP in the last 72  hours.  Lab Results  Component Value Date   SARSCOV2NAA POSITIVE (A) 04/14/2024   SARSCOV2NAA NEGATIVE 02/18/2024   SARSCOV2NAA NEGATIVE 01/14/2020    CBC: Recent Labs  Lab 06/25/24 2007 06/26/24 0447 06/27/24 1138 06/28/24 0449  WBC 12.9* 9.1 8.5 8.0  NEUTROABS 9.4*  --   --   --   HGB 11.1* 9.0* 10.4* 9.2*  HCT 34.1* 28.8* 31.5* 28.4*  MCV 84.6 88.3 85.1 86.6  PLT 597* 357 528* 467*   Cardiac Enzymes: No results for input(s): CKTOTAL, CKMB, CKMBINDEX, TROPONINI in the last 168 hours. BNP (last 3 results) No results for input(s): PROBNP in the last 8760 hours. CBG: Recent Labs  Lab 06/21/24 1200  GLUCAP 150*   D-Dimer: No results for input(s): DDIMER in the last 72 hours. Hgb A1c: No results for input(s): HGBA1C in the last 72 hours. Lipid Profile: No results for input(s): CHOL, HDL, LDLCALC, TRIG, CHOLHDL, LDLDIRECT in the last 72 hours. Thyroid  function studies: No results for input(s): TSH, T4TOTAL, T3FREE, THYROIDAB in the last 72 hours.  Invalid input(s): FREET3 Anemia work up: No results for input(s): VITAMINB12, FOLATE, FERRITIN, TIBC, IRON, RETICCTPCT in the last 72 hours. Sepsis Labs: Recent Labs  Lab 06/25/24 2007 06/26/24 0447 06/27/24 1138 06/28/24 0449  WBC 12.9* 9.1 8.5 8.0   Microbiology Recent Results (from the past 240 hours)  Aerobic/Anaerobic Culture w Gram Stain (surgical/deep wound)     Status: None (Preliminary result)   Collection Time: 06/26/24  2:55 PM   Specimen: Abscess  Result Value Ref Range Status   Specimen Description   Final    ABSCESS Performed at [facility], [address] [place], [place] [address]    Special Requests   Final    NONE Performed at [facility], [address], [place], [place] [address]    Gram Stain   Final    ABUNDANT WBC PRESENT, PREDOMINANTLY PMN NO ORGANISMS SEEN    Culture   Final    TOO YOUNG TO  READ Performed at [facility], [address] [place], [place] [address]    Report Status PENDING  Incomplete     Medications:    budesonide -glycopyrrolate -formoterol   2 puff Inhalation BID   citalopram   40 mg Oral Daily   diltiazem   180 mg Oral Daily   metoprolol  succinate  100 mg Oral Daily   pantoprazole   40 mg Oral Daily   sodium chloride  flush  5 mL Intracatheter Q8H   Continuous Infusions:  piperacillin -tazobactam (ZOSYN )  IV 3.375 g (06/28/24 0501)      LOS: 3 days   Erle Odell Castor  [facility]  06/28/2024, 8:38 AM

## 2024-06-29 ENCOUNTER — Telehealth: Payer: Self-pay

## 2024-06-29 ENCOUNTER — Encounter: Payer: Self-pay | Admitting: *Deleted

## 2024-06-29 DIAGNOSIS — N1831 Chronic kidney disease, stage 3a: Secondary | ICD-10-CM

## 2024-06-29 DIAGNOSIS — C182 Malignant neoplasm of ascending colon: Secondary | ICD-10-CM

## 2024-06-29 DIAGNOSIS — K651 Peritoneal abscess: Secondary | ICD-10-CM

## 2024-06-29 MED ORDER — POLYETHYLENE GLYCOL 3350 17 G PO PACK
17.0000 g | PACK | Freq: Every day | ORAL | Status: DC
  Filled 2024-06-29: qty 1

## 2024-06-29 MED ORDER — AMOXICILLIN-POT CLAVULANATE 875-125 MG PO TABS
1.0000 | ORAL_TABLET | Freq: Two times a day (BID) | ORAL | Status: DC
  Administered 2024-06-29: 1 via ORAL
  Filled 2024-06-29: qty 1

## 2024-06-29 MED ORDER — POLYETHYLENE GLYCOL 3350 17 G PO PACK: 17.0000 g | PACK | Freq: Every day | ORAL | 0 refills | Status: AC

## 2024-06-29 MED ORDER — OXYCODONE-ACETAMINOPHEN 7.5-325 MG PO TABS: 1.0000 | ORAL_TABLET | ORAL | 0 refills | Status: AC | PRN

## 2024-06-29 MED ORDER — AMOXICILLIN-POT CLAVULANATE 875-125 MG PO TABS: 1.0000 | ORAL_TABLET | Freq: Two times a day (BID) | ORAL | 0 refills | Status: AC

## 2024-06-29 MED ORDER — ONDANSETRON 4 MG PO TBDP: 4.0000 mg | ORAL_TABLET | Freq: Three times a day (TID) | ORAL | 0 refills | Status: AC | PRN

## 2024-06-29 NOTE — TOC Transition Note (Signed)
 Transition of Care [facility]) - Discharge Note   Patient Details  Name: Brooke [name] MRN: [id number] Date of Birth: 10-07-1950  [facility]) CM/SW Contact:  Alfonse JONELLE Rex, RN Phone Number: 06/29/2024, 10:04 AM   Clinical Narrative:   DC to home order. PT eval completed, recommendation for [facility] PT. Confirmed [facility], rep angela, patient currently active for [facility] PT. Request sent to attending to enter [facility] PT order. No further INPT CM needs identified at this time.     Final next level of care: Home w Home Health Services Barriers to Discharge: Barriers Resolved   Patient Goals and CMS Choice Patient states their goals for this hospitalization and ongoing recovery are:: return home          Discharge Placement                       Discharge Plan and Services Additional resources added to the After Visit Summary for                            [facility] Arranged: PT HH Agency: Other - See comment Damita) Date [facility] Agency Contacted: 06/29/24 Time HH Agency Contacted: 1004 Representative spoke with at [facility] Agency: Jon  Social Drivers of Health (SDOH) Interventions SDOH Screenings   Food Insecurity: No Food Insecurity (06/25/2024)  Housing: Low Risk  (06/25/2024)  Transportation Needs: No Transportation Needs (06/25/2024)  Utilities: Not At Risk (06/25/2024)  Alcohol Screen: Low Risk  (09/24/2023)  Depression (PHQ2-9): Low Risk  (06/22/2024)  Financial Resource Strain: Low Risk  (09/24/2023)  Physical Activity: Insufficiently Active (09/24/2023)  Social Connections: Moderately Isolated (06/25/2024)  Stress: No Stress Concern Present (09/24/2023)  Tobacco Use: Low Risk  (06/25/2024)  Health Literacy: Adequate Health Literacy (09/24/2023)     Readmission Risk Interventions    06/26/2024    3:46 PM 05/14/2024   12:41 PM 05/05/2024   11:28 AM  Readmission Risk Prevention Plan  Transportation Screening Complete Complete Complete  HRI or Home Care Consult   Complete   Social Work Consult for Recovery Care Planning/Counseling   Complete  Palliative Care Screening   Not Applicable  Medication Review Oceanographer) Complete Complete Complete  PCP or Specialist appointment within 3-5 days of discharge Complete    HRI or Home Care Consult Complete Complete   SW Recovery Care/Counseling Consult Complete Complete   Palliative Care Screening Not Applicable Not Applicable   Skilled Nursing Facility Not Applicable Not Applicable

## 2024-06-29 NOTE — Telephone Encounter (Signed)
 Notified by inpatient team that patient will be discharged today.  Unable to round on patient prior to departure.  RN reinforced flush instructions and drain care prior to departure adequate understanding expressed by patient and family.  Called patient and covered plan for her to return to clinic for drain evaluation in about 7 to 10 days will place orders for this follow-up.  She tells me that she was discharged with box of flushes.  Patient given number for patient care during this phone call and encouraged to call should she have problems with her drain, questions, or needs refill flushes.   Luma Clopper NP 06/29/2024 12:20 PM

## 2024-06-29 NOTE — Progress Notes (Signed)
 Nurse reviewed discharge instructions with pt.  Pt verbalized understanding of discharge instructions, follow up appointments and new medications.  Pt's daughter in law showed how to flush JP drain and change dressing.  No concerns at time of discharge.  Pt's abd dressing change prior to discharge.

## 2024-06-29 NOTE — Plan of Care (Signed)

## 2024-06-29 NOTE — Evaluation (Signed)
 Occupational Therapy Evaluation Patient Details Name: Brooke [name] MRN: [id number] DOB: 1951-07-01 Today's Date: 06/29/2024   History of Present Illness   Brooke [name] is a 73 y.o. female admitted from home with post op intra-abdominal abscess and with hx of recent  LAPAROSCOPIC ASSISTED  RIGHT COLECTOMY WITH EXTENDED RESECTION OF DISTAL ILEUM 11/21. PMH: HTN, HLD, DM2, asthma, anxiety, hypothyroidism, CKD stage IIIb, GERD     Clinical Impressions PTA, pt lives with family, typically Modified Independent with ADLs and mobility using Rollator while family manages IADLs. Pt presents now with minor deficits in abdominal pain and activity tolerance. Pt able to manage bathroom mobility and hallway mobility using RW with Supervision. Pt requires Setup for UB ADL and Supervision-Min A for LB ADLs. Pt may benefit from AE education to maximize LB ADL independence given abdominal discomfort. Pt endorses adequate assist as needed at home without postacute OT services needed. Will continue to follow acutely.     If plan is discharge home, recommend the following:   A little help with bathing/dressing/bathroom;Assistance with cooking/housework     Functional Status Assessment   Patient has had a recent decline in their functional status and demonstrates the ability to make significant improvements in function in a reasonable and predictable amount of time.     Equipment Recommendations   BSC/3in1     Recommendations for Other Services         Precautions/Restrictions   Precautions Precautions: Fall Recall of Precautions/Restrictions: Intact Precaution/Restrictions Comments: R JP drain Restrictions Weight Bearing Restrictions Per Provider Order: No     Mobility Bed Mobility Overal bed mobility: Needs Assistance Bed Mobility: Supine to Sit     Supine to sit: Supervision          Transfers Overall transfer level: Needs assistance Equipment used: Rolling walker (2  wheels) Transfers: Sit to/from Stand Sit to Stand: Supervision                  Balance Overall balance assessment: Needs assistance Sitting-balance support: No upper extremity supported, Feet unsupported Sitting balance-Leahy Scale: Good     Standing balance support: No upper extremity supported Standing balance-Leahy Scale: Fair                             ADL either performed or assessed with clinical judgement   ADL Overall ADL's : Needs assistance/impaired Eating/Feeding: Independent   Grooming: Supervision/safety;Standing   Upper Body Bathing: Set up;Sitting   Lower Body Bathing: Supervison/ safety;Sit to/from stand;Sitting/lateral leans;Set up   Upper Body Dressing : Set up;Sitting   Lower Body Dressing: Minimal assistance;Sit to/from stand;Sitting/lateral leans Lower Body Dressing Details (indicate cue type and reason): Min A to don socks d/t abdominal discomfort. may benefit from AE education Toilet Transfer: Supervision/safety;Ambulation;Regular Toilet;Rolling walker (2 wheels) Toilet Transfer Details (indicate cue type and reason): steady gait with RW use Toileting- Clothing Manipulation and Hygiene: Supervision/safety;Sitting/lateral lean;Sit to/from stand       Functional mobility during ADLs: Supervision/safety;Rolling walker (2 wheels)       Vision Baseline Vision/History: 1 Wears glasses Ability to See in Adequate Light: 0 Adequate Patient Visual Report: No change from baseline Vision Assessment?: No apparent visual deficits     Perception         Praxis         Pertinent Vitals/Pain Pain Assessment Pain Assessment: Faces Faces Pain Scale: Hurts a little bit Pain Location: abdomen Pain Descriptors / Indicators:  Grimacing Pain Intervention(s): Monitored during session     Extremity/Trunk Assessment Upper Extremity Assessment Upper Extremity Assessment: Overall WFL for tasks assessed;Right hand dominant   Lower  Extremity Assessment Lower Extremity Assessment: Defer to PT evaluation   Cervical / Trunk Assessment Cervical / Trunk Assessment: Normal   Communication Communication Communication: No apparent difficulties   Cognition Arousal: Alert Behavior During Therapy: WFL for tasks assessed/performed, Flat affect Cognition: No apparent impairments                               Following commands: Intact       Cueing  General Comments   Cueing Techniques: Verbal cues      Exercises     Shoulder Instructions      Home Living Family/patient expects to be discharged to:: Private residence Living Arrangements: Children Available Help at Discharge: Family;Available 24 hours/day Type of Home: House Home Access: Stairs to enter [employer] of Steps: 2 Entrance Stairs-Rails: Right;Left;Can reach both Home Layout: One level     Bathroom Shower/Tub: Chief Strategy Officer: Standard Bathroom Accessibility: Yes   Home Equipment: Rollator (4 wheels);Cane - single point;Hand held Clinical Biochemist: Reacher Additional Comments: 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 [employer]      Prior Functioning/Environment Prior Level of Function : Independent/Modified Independent             Mobility Comments: pt reports using rollator in the home and for appointments ADLs Comments: pt reports DIL completes household chores and provides transportation, pt reports was ind with bathing, dressing and toileting    OT Problem List: Decreased activity tolerance;Pain;Decreased knowledge of use of DME or AE   OT Treatment/Interventions: Self-care/ADL training;Therapeutic exercise;Energy conservation;DME and/or AE instruction;Therapeutic activities;Balance training;Patient/family education      OT Goals(Current goals can be found in the care plan section)   Acute Rehab OT Goals Patient Stated Goal: home soon, have more energy OT Goal  Formulation: With patient Time For Goal Achievement: 07/13/24 Potential to Achieve Goals: Good   OT Frequency:  Min 2X/week    Co-evaluation              AM-PAC OT 6 Clicks Daily Activity     Outcome Measure Help from another person eating meals?: None Help from another person taking care of personal grooming?: A Little Help from another person toileting, which includes using toliet, bedpan, or urinal?: A Little Help from another person bathing (including washing, rinsing, drying)?: A Little Help from another person to put on and taking off regular upper body clothing?: A Little Help from another person to put on and taking off regular lower body clothing?: A Little 6 Click Score: 19   End of Session Equipment Utilized During Treatment: Rolling walker (2 wheels) Nurse Communication: Mobility status  Activity Tolerance: Patient tolerated treatment well Patient left: in chair;with call bell/phone within reach  OT Visit Diagnosis: Muscle weakness (generalized) (M62.81)                Time: 9185-9162 OT Time Calculation (min): 23 min Charges:  OT General Charges $OT Visit: 1 Visit OT Evaluation $OT Eval Low Complexity: 1 Low  Mliss NOVAK, OTR/L Acute Rehab Services Office: [phone]   Mliss Fish 06/29/2024, 8:50 AM

## 2024-06-29 NOTE — Discharge Summary (Signed)
 Physician Discharge Summary  IONA STAY FMW:[id number] DOB: 31-May-1951 DOA: 06/25/2024  PCP: Norleen Lynwood ORN, MD  Admit date: 06/25/2024 Discharge date: 06/29/2024  Admitted From: Home Disposition:  Home  Recommendations for Outpatient Follow-up:  Follow up with PCP in 1-2 weeks Please obtain BMP/CBC in one week   Home Health:No Equipment/Devices:None  Discharge Condition:Stable CODE STATUS:Full Diet recommendation: Heart Healthy   Brief/Interim Summary: 73 y.o. female past medical history significant for asthma, chronic kidney disease stage IIIa, essential hypertension depression anxiety recently diagnosed with stage II adenocarcinoma of the right colon status post right colectomy in June 12, 2024 comes back to the ED for a postoperative intra-abdominal abscess.  CT scan done showed a right upper quadrant 7.5 x 7.1 cm probable abscess with surrounding edema and tissue stranding.   Discharge Diagnoses:  Principal Problem:   Postoperative intra-abdominal abscess [facility]) Active Problems:   Cancer of ascending colon pT3pN0 s/p right colectomy 06/12/2024   Hyperlipidemia   Normocytic anemia   Essential hypertension   Asthma   Hypokalemia   Depression with anxiety   Hypomagnesemia   Chronic kidney disease, stage 3a (HCC)  Postoperative intra-abdominal abscess: CT scan of the abdomen pelvis showed an abscess in the right upper quadrant. General surgery was consulted along with IR placed a drain on 06/26/2024. Drain output decreased. Was also started empirically on Zosyn , now transition to oral Augmentin  which should continue as an outpatient. She will follow-up with IR as an outpatient for drain removal.  Electrolyte imbalance hypomagnesemia/hypokalemia: Repleted orally now improved.  Normocytic anemia: Her hemoglobin appears to be stable.  Chronic kidney disease stage IIIa: Her creatinine remained at baseline.  Cancer of ascending colon pT3pN0 s/p right colectomy  06/12/2024: Continues to follow-up with Dr. Lanny for adjuvant therapy as an outpatient.   Essential hypertension: No changes to regimen continue current medications.   Asthma: No changes to her medication continue current regimen.   Hyperlipidemia: Resume statins as an outpatient.   Depression/anxiety: Continue Celexa .  Discharge Instructions  Discharge Instructions     Diet - low sodium heart healthy   Complete by: As directed    Increase activity slowly   Complete by: As directed    No wound care   Complete by: As directed       Allergies as of 06/29/2024       Reactions   Gnp Glp-1 Daily Support [germanium] Other (See Comments)   Ileus requiring hospitalization   Metformin  And Related Nausea And Vomiting   Ace Inhibitors Swelling, Other (See Comments)   Ankles swell   Codeine Nausea And Vomiting, Rash   Hydrocodone  Nausea And Vomiting   Tramadol  Nausea And Vomiting   Tylenol  [acetaminophen ] Itching, Rash, Other (See Comments)   Allergic to prescription strength Tylenol . The patient is taking 500 mg Tylenol  in 2025, however.        Medication List     STOP taking these medications    Azelastine  HCl 137 MCG/SPRAY Soln   Breztri  Aerosphere 160-9-4.8 MCG/ACT Aero inhaler Generic drug: budesonide -glycopyrrolate -formoterol    cetirizine  10 MG tablet Commonly known as: ZYRTEC        TAKE these medications    albuterol  108 (90 Base) MCG/ACT inhaler Commonly known as: VENTOLIN  HFA Inhale 2 puffs into the lungs every 4 (four) hours as needed for wheezing or shortness of breath.   allopurinol  100 MG tablet Commonly known as: ZYLOPRIM  TAKE 1 TABLET BY MOUTH EVERY DAY   amoxicillin -clavulanate 875-125 MG tablet Commonly known as: AUGMENTIN  Take  1 tablet by mouth every 12 (twelve) hours for 4 days.   atropine  1 % ophthalmic solution Place 1 drop into the left eye daily.   brimonidine  0.2 % ophthalmic solution Commonly known as: ALPHAGAN  3 (three)  times daily. 1 drop in the left eye three times daily   citalopram  40 MG tablet Commonly known as: CELEXA  TAKE 1 TABLET BY MOUTH EVERY DAY   clonazePAM  0.5 MG tablet Commonly known as: KLONOPIN  Take 1 tablet (0.5 mg total) by mouth daily as needed (vertigo).   colchicine  0.6 MG tablet TAKE 0.5 TABLETS (0.3 MG TOTAL) BY MOUTH DAILY AS NEEDED (GOUT OR PSUEDOGOUT PAIN). What changed: reasons to take this   diltiazem  180 MG 24 hr capsule Commonly known as: CARDIZEM  CD Take 1 capsule (180 mg total) by mouth daily.   erythromycin  ophthalmic ointment Place 1 Application into the left eye at bedtime.   famotidine  20 MG tablet Commonly known as: Pepcid  Take 1 tablet (20 mg total) by mouth 2 (two) times daily.   hydrochlorothiazide  12.5 MG capsule Commonly known as: MICROZIDE    Jardiance  25 MG Tabs tablet Generic drug: empagliflozin  Take 25 mg by mouth daily.   latanoprost  0.005 % ophthalmic solution Commonly known as: XALATAN  Place 1 drop into the left eye at bedtime.   levocetirizine 5 MG tablet Commonly known as: XYZAL  Take 1 tablet (5 mg total) by mouth daily.   meclizine  25 MG tablet Commonly known as: ANTIVERT  TAKE 1 TABLET BY MOUTH 3 TIMES A DAY AS NEEDED FOR DIZZINESS   metoprolol  succinate 100 MG 24 hr tablet Commonly known as: TOPROL -XL Take 1 tablet (100 mg total) by mouth daily. Take with or immediately following a meal. What changed:  when to take this additional instructions   ondansetron  4 MG disintegrating tablet Commonly known as: ZOFRAN -ODT Take 1 tablet (4 mg total) by mouth every 8 (eight) hours as needed for nausea or vomiting.   oxyCODONE -acetaminophen  7.5-325 MG tablet Commonly known as: Percocet Take 1 tablet by mouth every 4 (four) hours as needed for up to 5 days for severe pain (pain score 7-10).   pantoprazole  40 MG tablet Commonly known as: PROTONIX  Take 1 tablet (40 mg total) by mouth daily.   polyethylene glycol 17 g packet Commonly  known as: MIRALAX  / GLYCOLAX  Take 17 g by mouth daily for 4 days.   potassium chloride  10 MEQ tablet Commonly known as: Klor-Con  10 Take 1 tablet (10 mEq total) by mouth daily for 5 days.   rosuvastatin  40 MG tablet Commonly known as: CRESTOR  TAKE 1 TABLET BY MOUTH EVERY DAY   sodium bicarbonate  650 MG tablet Take 2 tablets (1,300 mg total) by mouth 2 (two) times daily. What changed: additional instructions   traZODone  100 MG tablet Commonly known as: DESYREL  TAKE 1 TABLET (100 MG TOTAL) BY MOUTH AT BEDTIME AS NEEDED. FOR SLEEP   Trelegy Ellipta  200-62.5-25 MCG/ACT Aepb Generic drug: Fluticasone -Umeclidin-Vilant Inhale 1 puff into the lungs daily.        Allergies  Allergen Reactions   Gnp Glp-1 Daily Support [Germanium] Other (See Comments)    Ileus requiring hospitalization   Metformin  And Related Nausea And Vomiting   Ace Inhibitors Swelling and Other (See Comments)    Ankles swell   Codeine Nausea And Vomiting and Rash   Hydrocodone  Nausea And Vomiting   Tramadol  Nausea And Vomiting   Tylenol  [Acetaminophen ] Itching, Rash and Other (See Comments)    Allergic to prescription strength Tylenol . The patient is taking  500 mg Tylenol  in 2025, however.    Consultations: General Surgery Interventional radiology   Procedures/Studies: CT GUIDED PERITONEAL/RETROPERITONEAL FLUID DRAIN BY PERC CATH Result Date: 06/26/2024 INDICATION: 201066 Intra-abdominal abscess (HCC) 201066 EXAM: CT-GUIDED RIGHT UPPER QUADRANT ABSCESS DRAINAGE CATHETER PLACEMENT COMPARISON:  CT AP, 06/25/2024. MEDICATIONS: The patient is currently admitted to the hospital and receiving intravenous antibiotics. The antibiotics were administered within an appropriate time frame prior to the initiation of the procedure. ANESTHESIA/SEDATION: Moderate (conscious) sedation was employed during this procedure. A total of Versed  2 mg and Fentanyl  100 mcg was administered intravenously. Moderate Sedation Time: 26  minutes. The patient's level of consciousness and vital signs were monitored continuously by radiology nursing throughout the procedure under my direct supervision. CONTRAST:  None FLUOROSCOPY TIME:  CT dose; 792 mGycm COMPLICATIONS: None immediate. PROCEDURE: RADIATION DOSE REDUCTION: This exam was performed according to the departmental dose-optimization program which includes automated exposure control, adjustment of the mA and/or kV according to patient size and/or use of iterative reconstruction technique. Informed written consent was obtained from the patient after a discussion of the risks, benefits and alternatives to treatment. The patient was placed supine on the CT gantry and a pre procedural CT was performed re-demonstrating the known abscess/fluid collection within the RIGHT upper quadrant. The procedure was planned. A timeout was performed prior to the initiation of the procedure. The RIGHT upper quadrant was prepped and draped in the usual sterile fashion. The overlying soft tissues were anesthetized with 1% lidocaine  with epinephrine . Appropriate trajectory was planned with the use of a 22 gauge spinal needle. An 18 gauge trocar needle was advanced into the abscess/fluid collection and a short Amplatz super stiff wire was coiled within the collection. Appropriate positioning was confirmed with a limited CT scan. The tract was serially dilated allowing placement of a 14 Fr drainage catheter. Appropriate positioning was confirmed with a limited postprocedural CT scan. 5 mL of dark sanguinous fluid was aspirated. The tube was connected to a bulb suction and sutured in place. A dressing was placed. The patient tolerated the procedure well without immediate post procedural complication. IMPRESSION: Successful CT-guided placement of a 14 Fr drainage catheter into the RIGHT upper quadrant Aspiration of 5 mL of dark sanguinous fluid, suspicious for hematoma. Samples were sent to the laboratory as requested  by the ordering clinical team. RECOMMENDATIONS: The patient will return to Vascular Interventional Radiology (VIR) for routine drainage catheter evaluation in 7-10 days. Thom Hall, MD [facility] Radiology Electronically Signed   By: Thom Hall M.D.   On: 06/26/2024 17:35   CT ABDOMEN PELVIS W CONTRAST Result Date: 06/25/2024 CLINICAL DATA:  Generalized abdominal pain. Status post recent laparoscopic partial colectomy. EXAM: CT ABDOMEN AND PELVIS WITH CONTRAST TECHNIQUE: Multidetector CT imaging of the abdomen and pelvis was performed using the standard protocol following bolus administration of intravenous contrast. RADIATION DOSE REDUCTION: This exam was performed according to the departmental dose-optimization program which includes automated exposure control, adjustment of the mA and/or kV according to patient size and/or use of iterative reconstruction technique. CONTRAST:  OMNIPAQUE  IOHEXOL  300 MG/ML  SOLN COMPARISON:  Preoperative CT 06/07/2024 FINDINGS: Lower chest: Trace bilateral pleural effusions, right greater than left. Patulous distal esophagus. Hepatobiliary: Multiple cysts in the liver, stable from prior exam. Stable biliary dilatation post cholecystectomy. Pancreas: Fatty atrophy.  No ductal dilatation or inflammation. Spleen: Heterogeneous enhancement without discrete lesion. Cleft posteriorly. Small amount of perisplenic fluid is again seen, simple in density. Adrenals/Urinary Tract:  No adrenal nodule. No hydronephrosis or renal calculi. Low-density lesion arising from the upper left kidney measuring 2.7 cm, unchanged from prior exams. Unremarkable urinary bladder. Stomach/Bowel: Right hemicolectomy. Heterogeneous air-fluid collection in the right upper quadrant posterior to the surgical anastomosis measures 7.5 x 7.1 cm, without internal contrast. This has surrounding edema and soft tissue stranding. Collection abuts the inferior right  lobe of the liver, small bowel loops in the right abdomen as well as lateral aspect of the duodenum. Administered enteric contrast reaches the colon. No bowel obstruction. No small bowel ileus. Colonic diverticulosis without diverticulitis. Vascular/Lymphatic: Aortic atherosclerosis. No aortic aneurysm. The portal vein is patent. Shotty retroperitoneal and central mesenteric nodes are likely reactive in this setting. Prominent 8 mm short axis lymph nodes in the low pelvis, series 2, image 71, slightly increased in size from prior. Reproductive: Status post hysterectomy. No adnexal masses. Other: Heterogeneous right upper quadrant fluid collection as described. Adjacent stranding and small amount of free fluid extends to the inferior aspect of the liver. Minimal pockets of free fluid in the mesentery without additional organized collection. Small amount of perisplenic fluid was present on prior exam. Postsurgical change of the midline anterior abdominal wall. Small foci of gas in the anterior abdominal wall above the umbilicus likely related to prior laparoscopic port site. There is generalized subcutaneous edema and fat stranding about the anterior abdominal wall, as well as both lateral flanks. No organized subcutaneous collection. Probable sebaceous cyst in the right gluteal soft tissues. Musculoskeletal: There are no acute or suspicious osseous abnormalities. No intramuscular collection. IMPRESSION: 1. Recent right hemicolectomy. Heterogeneous air-fluid collection in the right upper quadrant measures 7.5 x 7.1 cm, consistent with abscess. This has surrounding edema and soft tissue stranding. No internal enteric contrast to suggest operative leak. 2. Trace bilateral pleural effusions, right greater than left. 3. Generalized subcutaneous edema and fat stranding about the anterior abdominal wall, as well as both lateral flanks. No organized subcutaneous collection. 4. Prominent 8 mm short axis lymph nodes in the low  pelvis, slightly increased in size from prior exam, likely reactive. Aortic Atherosclerosis (ICD10-I70.0). These results will be called to the ordering clinician or representative by the Radiologist Assistant, and communication documented in the PACS or [employer]. Electronically Signed   By: Andrea Gasman M.D.   On: 06/25/2024 18:29   US  EKG SITE RITE Result Date: 06/09/2024 If Site Rite image not attached, placement could not be confirmed due to current cardiac rhythm.  CT ABDOMEN PELVIS W CONTRAST Result Date: 06/07/2024 CLINICAL DATA:  Possible bowel obstruction. Abdominal pain beginning yesterday. EXAM: CT ABDOMEN AND PELVIS WITH CONTRAST TECHNIQUE: Multidetector CT imaging of the abdomen and pelvis was performed using the standard protocol following bolus administration of intravenous contrast. RADIATION DOSE REDUCTION: This exam was performed according to the departmental dose-optimization program which includes automated exposure control, adjustment of the mA and/or kV according to patient size and/or use of iterative reconstruction technique. CONTRAST:  80mL OMNIPAQUE  IOHEXOL  300 MG/ML  SOLN COMPARISON:  CT 04/28/2024, MRI abdomen 01/07/2022 FINDINGS: Lower chest: Heart is normal size. Minimal calcified plaque over the descending thoracic aorta. Visualized lung bases are normal for a tiny amount of right pleural fluid. Hepatobiliary: Previous cholecystectomy. There are several liver cysts unchanged. Biliary tree is normal. Pancreas: Fatty atrophy of the pancreas which is otherwise normal. Spleen: Normal. Adrenals/Urinary Tract: Adrenal glands are normal. Kidneys are normal in size. No focal mass or hydronephrosis. No nephrolithiasis. 2.8 cm hypodense mass over the upper  pole left kidney unchanged compatible with slightly hyperdense cyst. Ureters and bladder are normal. Stomach/Bowel: Possible small sliding hiatal hernia unchanged. Stomach is otherwise unremarkable. Small bowel is notable  for wall thickening of a moderate segment of the distal ileum. There is focal circumferential narrowing with slight associated increased soft tissue density/enhancement involving the ascending colon in the region of the hepatic flexure. This is concerning for a focal mass/neoplasm. Just proximal to this narrowing, the cecum and ascending colon is fluid-filled and mildly dilated measuring 7.6 cm in diameter. Appendix is enlarged measuring 1.3 cm distally near the tip and demonstrates mucosal enhancement. There is a small amount of free fluid in the right lower quadrant as findings are concerning for early acute appendicitis. Vascular/Lymphatic: Mild calcified plaque over the abdominal aorta which is normal in caliber. No adenopathy. Reproductive: Status post hysterectomy. No adnexal masses. Other: Mild free fluid over the perisplenic region and minimally over the right pericolic gutter in the right lower quadrant and in the pelvis. Musculoskeletal: No focal abnormality. IMPRESSION: 1. Enlarged appendix measuring 1.3 cm in diameter with mucosal enhancement and mild adjacent free fluid in the right lower quadrant. Findings are concerning for mild acute appendicitis. 2. Focal circumferential narrowing with slight associated increased soft tissue density/enhancement over the ascending colon in the region of the hepatic flexure. This is concerning for a focal mass/neoplasm. Consider colonoscopy for further evaluation. 3. Wall thickening of a moderate segment of the distal ileum. Findings may be due to small bowel enteritis of infectious or inflammatory nature. 4. Mild free peritoneal fluid. 5. Several liver cysts unchanged. 6. 2.8 cm hypodense mass over the upper pole left kidney unchanged compatible with slightly hyperdense cyst. 7. Aortic atherosclerosis. Aortic Atherosclerosis (ICD10-I70.0). These results were called by telephone at the time of interpretation on 06/07/2024 at 5:05 pm to provider [facility] , who  verbally acknowledged these results. Electronically Signed   By: Toribio Agreste M.D.   On: 06/07/2024 17:05   (Echo, Carotid, EGD, Colonoscopy, ERCP)    Subjective: No complaints  Discharge Exam: Vitals:   06/29/24 0504 06/29/24 0607  BP: 134/61   Pulse: 76   Resp: 14   Temp: 98.4 F (36.9 C)   SpO2: 98% 94%   Vitals:   06/28/24 1951 06/28/24 2032 06/29/24 0504 06/29/24 0607  BP: (!) 133/59  134/61   Pulse: 76  76   Resp: 15  14   Temp: 98 F (36.7 C)  98.4 F (36.9 C)   TempSrc: Oral  Oral   SpO2: 100% 95% 98% 94%  Weight:      Height:        General: Pt is alert, awake, not in acute distress Cardiovascular: RRR, S1/S2 +, no rubs, no gallops Respiratory: CTA bilaterally, no wheezing, no rhonchi Abdominal: Soft, NT, ND, bowel sounds + Extremities: no edema, no cyanosis    The results of significant diagnostics from this hospitalization (including imaging, microbiology, ancillary and laboratory) are listed below for reference.     Microbiology: Recent Results (from the past 240 hours)  Aerobic/Anaerobic Culture w Gram Stain (surgical/deep wound)     Status: None (Preliminary result)   Collection Time: 06/26/24  2:55 PM   Specimen: Abscess  Result Value Ref Range Status   Specimen Description   Final    ABSCESS Performed at [facility], [address], [place], [place] [address]    Special Requests   Final    NONE Performed at [facility],  [address], [place], [place] [address]    Gram Stain   Final    ABUNDANT WBC PRESENT, PREDOMINANTLY PMN NO ORGANISMS SEEN Performed at [facility], [address] [place], [place] [address]    Culture   Final    ABUNDANT ENTEROCOCCUS FAECALIS CULTURE REINCUBATED FOR BETTER GROWTH NO ANAEROBES ISOLATED; CULTURE IN PROGRESS FOR 5 DAYS    Report Status PENDING  Incomplete     Labs: BNP (last 3 results) Recent Labs    02/18/24 1811  BNP 54.3   Basic Metabolic  Panel: Recent Labs  Lab 06/25/24 2007 06/26/24 0447 06/27/24 0540  NA 139 140 142  K 3.2* 2.9* 3.8  CL 109 111 113*  CO2 19* 17* 20*  GLUCOSE 78 71 74  BUN 7* 7* 7*  CREATININE 1.23* 1.13* 1.25*  CALCIUM  8.4* 7.5* 8.1*  MG 1.6* 1.9  --   PHOS  --   --  3.0   Liver Function Tests: Recent Labs  Lab 06/25/24 2007 06/26/24 0447 06/27/24 0540  AST 42* 62*  --   ALT 53* 45*  --   ALKPHOS 133* 128*  --   BILITOT 0.3 0.2  --   PROT 6.2* 4.7*  --   ALBUMIN  2.6* 2.1* 2.2*   No results for input(s): LIPASE, AMYLASE in the last 168 hours. No results for input(s): AMMONIA in the last 168 hours. CBC: Recent Labs  Lab 06/25/24 2007 06/26/24 0447 06/27/24 1138 06/28/24 0449  WBC 12.9* 9.1 8.5 8.0  NEUTROABS 9.4*  --   --   --   HGB 11.1* 9.0* 10.4* 9.2*  HCT 34.1* 28.8* 31.5* 28.4*  MCV 84.6 88.3 85.1 86.6  PLT 597* 357 528* 467*   Cardiac Enzymes: No results for input(s): CKTOTAL, CKMB, CKMBINDEX, TROPONINI in the last 168 hours. BNP: Invalid input(s): POCBNP CBG: No results for input(s): GLUCAP in the last 168 hours. D-Dimer No results for input(s): DDIMER in the last 72 hours. Hgb A1c No results for input(s): HGBA1C in the last 72 hours. Lipid Profile No results for input(s): CHOL, HDL, LDLCALC, TRIG, CHOLHDL, LDLDIRECT in the last 72 hours. Thyroid  function studies No results for input(s): TSH, T4TOTAL, T3FREE, THYROIDAB in the last 72 hours.  Invalid input(s): FREET3 Anemia work up No results for input(s): VITAMINB12, FOLATE, FERRITIN, TIBC, IRON, RETICCTPCT in the last 72 hours. Urinalysis    Component Value Date/Time   COLORURINE YELLOW 06/08/2024 0035   APPEARANCEUR HAZY (A) 06/08/2024 0035   LABSPEC 1.025 06/08/2024 0035   PHURINE 6.0 06/08/2024 0035   GLUCOSEU 150 (A) 06/08/2024 0035   GLUCOSEU 500 (A) 09/06/2022 1011   HGBUR SMALL (A) 06/08/2024 0035   BILIRUBINUR NEGATIVE 06/08/2024 0035    BILIRUBINUR neg 09/12/2016 0954   KETONESUR NEGATIVE 06/08/2024 0035   PROTEINUR 100 (A) 06/08/2024 0035   UROBILINOGEN 0.2 09/06/2022 1011   NITRITE NEGATIVE 06/08/2024 0035   LEUKOCYTESUR NEGATIVE 06/08/2024 0035   Sepsis Labs Recent Labs  Lab 06/25/24 2007 06/26/24 0447 06/27/24 1138 06/28/24 0449  WBC 12.9* 9.1 8.5 8.0   Microbiology Recent Results (from the past 240 hours)  Aerobic/Anaerobic Culture w Gram Stain (surgical/deep wound)     Status: None (Preliminary result)   Collection Time: 06/26/24  2:55 PM   Specimen: Abscess  Result Value Ref Range Status   Specimen Description   Final    ABSCESS Performed at [facility], [address], [place], [place] [address]    Special Requests  Final    NONE Performed at [facility], [address], [place], [place] [address]    Gram Stain   Final    ABUNDANT WBC PRESENT, PREDOMINANTLY PMN NO ORGANISMS SEEN Performed at [facility], [address], [place], [place] [address]    Culture   Final    ABUNDANT ENTEROCOCCUS FAECALIS CULTURE REINCUBATED FOR BETTER GROWTH NO ANAEROBES ISOLATED; CULTURE IN PROGRESS FOR 5 DAYS    Report Status PENDING  Incomplete     Time coordinating discharge: Over 35 minutes  SIGNED:   Erle Odell Castor, MD  [facility] 06/29/2024, 9:05 AM Pager   If 7PM-7AM, please contact night-coverage [email or web address] Password [email or web address]

## 2024-06-29 NOTE — Progress Notes (Signed)
 Progress Note     Interval: No acute changes. Feels well this morning. Afebrile, WBC normal. Drain cultures now with enterococcus.  Objective: Vital signs in last 24 hours: Temp:  [98 F (36.7 C)-98.4 F (36.9 C)] 98.4 F (36.9 C) (12/08 0504) Pulse Rate:  [76] 76 (12/08 0504) Resp:  [14-15] 14 (12/08 0504) BP: (129-134)/(54-61) 134/61 (12/08 0504) SpO2:  [94 %-100 %] 94 % (12/08 0607) Last BM Date : 06/28/24  Intake/Output from previous day: 12/07 0701 - 12/08 0700 In: 400 [P.O.:390] Out: 375 [Urine:350; Drains:25] Intake/Output this shift: Total I/O In: 240 [P.O.:240] Out: -   PE: General: resting comfortably, NAD Lungs: Respiratory effort nonlabored. Abd: Soft, tenderness around abdominal wound, packing in place. Drain with old sanguinous drainage, more thin blood in nature. Psych: A&Ox3 with an appropriate affect.    Lab Results:  Recent Labs    06/27/24 1138 06/28/24 0449  WBC 8.5 8.0  HGB 10.4* 9.2*  HCT 31.5* 28.4*  PLT 528* 467*   BMET Recent Labs    06/27/24 0540  NA 142  K 3.8  CL 113*  CO2 20*  GLUCOSE 74  BUN 7*  CREATININE 1.25*  CALCIUM  8.1*   PT/INR No results for input(s): LABPROT, INR in the last 72 hours. CMP     Component Value Date/Time   NA 142 06/27/2024 0540   NA 143 06/01/2024 0958   K 3.8 06/27/2024 0540   CL 113 (H) 06/27/2024 0540   CO2 20 (L) 06/27/2024 0540   GLUCOSE 74 06/27/2024 0540   GLUCOSE 96 05/27/2006 1052   BUN 7 (L) 06/27/2024 0540   BUN 16 06/01/2024 0958   CREATININE 1.25 (H) 06/27/2024 0540   CREATININE 0.90 01/01/2014 1046   CALCIUM  8.1 (L) 06/27/2024 0540   PROT 4.7 (L) 06/26/2024 0447   PROT 4.8 (L) 06/01/2024 0958   ALBUMIN  2.2 (L) 06/27/2024 0540   ALBUMIN  2.5 (L) 06/01/2024 0958   AST 62 (H) 06/26/2024 0447   ALT 45 (H) 06/26/2024 0447   ALKPHOS 128 (H) 06/26/2024 0447   BILITOT 0.2 06/26/2024 0447   BILITOT 0.2 06/01/2024 0958   GFRNONAA 46 (L) 06/27/2024 0540   GFRAA 39 (L)  09/24/2018 1054   Lipase     Component Value Date/Time   LIPASE <10 (L) 06/07/2024 1350       Studies/Results: No results found.   Anti-infectives: Anti-infectives (From admission, onward)    Start     Dose/Rate Route Frequency Ordered Stop   06/29/24 1000  amoxicillin -clavulanate (AUGMENTIN ) 875-125 MG per tablet 1 tablet        1 tablet Oral Every 12 hours 06/29/24 0724 07/02/24 0959   06/29/24 0000  amoxicillin -clavulanate (AUGMENTIN ) 875-125 MG tablet        1 tablet Oral Every 12 hours 06/29/24 0902 07/03/24 2359   06/26/24 0400  piperacillin -tazobactam (ZOSYN ) IVPB 3.375 g  Status:  Discontinued        3.375 g 12.5 mL/hr over 240 Minutes Intravenous Every 8 hours 06/25/24 2246 06/28/24 0903   06/25/24 2115  piperacillin -tazobactam (ZOSYN ) IVPB 3.375 g        3.375 g 100 mL/hr over 30 Minutes Intravenous  Once 06/25/24 2102 06/25/24 2150        Assessment/Plan S/P Laparoscopic assisted Right colectomy for colon cancer on 06/12/24 by Dr. Tanda. Returned after outpatient visit where hematoma was drained from abdominal wound and staples removed. - CT from 12/4 showed concerns of abscess, drain placed by IR. Output  is consistent with a hematoma and drain cultures now growing enterococcus.  Will restart Augmentin  x 3 days only.  D/w primary service - Continue wet-to-dry dressing changes BID or when soiled. - From a surgical standpoint patient may be discharged home. She has follow up with Dr. Tanda scheduled this week on 12/12, and will follow up with IR for drain removal.  Reached out to IR to discuss drain care and follow up with her. -d/w primary service as well.   LOS: 4 days    Burnard FORBES Banter, [facility] 06/29/2024, 10:13 AM Please see Amion for pager number during day hours 7:00am-4:30pm

## 2024-06-30 ENCOUNTER — Telehealth: Payer: Self-pay | Admitting: *Deleted

## 2024-06-30 ENCOUNTER — Other Ambulatory Visit: Payer: Self-pay | Admitting: General Surgery

## 2024-06-30 DIAGNOSIS — K651 Peritoneal abscess: Secondary | ICD-10-CM

## 2024-06-30 NOTE — Transitions of Care (Post Inpatient/ED Visit) (Signed)
   06/30/2024  Name: Brooke [name] MRN: [id number] DOB: [birth year]  Today's TOC FU Call Status: Today's TOC FU Call Status:: Unsuccessful Call (1st Attempt) Unsuccessful Call (1st Attempt) Date: 06/30/24  Attempted to reach the patient regarding the most recent Inpatient visit.  Left HIPAA compliant voice message   Follow Up Plan: Additional outreach attempts will be made to reach the patient to complete the Transitions of Care (Post Inpatient/ED visit) call.   Pls call/ message for questions,  Nancye Grumbine Mckinney Kanna Dafoe, RN, BSN, CCRN Alumnus RN Care Manager  Transitions of Care  [facility] - [facility] [phone]: direct office

## 2024-07-01 ENCOUNTER — Encounter: Payer: Self-pay | Admitting: Internal Medicine

## 2024-07-01 ENCOUNTER — Ambulatory Visit: Admitting: Internal Medicine

## 2024-07-01 ENCOUNTER — Telehealth: Payer: Self-pay | Admitting: *Deleted

## 2024-07-01 ENCOUNTER — Ambulatory Visit: Payer: Self-pay | Admitting: Internal Medicine

## 2024-07-01 VITALS — BP 128/72 | HR 85 | Temp 98.3°F | Ht 63.0 in | Wt 187.0 lb

## 2024-07-01 DIAGNOSIS — K651 Peritoneal abscess: Secondary | ICD-10-CM | POA: Diagnosis not present

## 2024-07-01 DIAGNOSIS — I5189 Other ill-defined heart diseases: Secondary | ICD-10-CM

## 2024-07-01 DIAGNOSIS — E1165 Type 2 diabetes mellitus with hyperglycemia: Secondary | ICD-10-CM

## 2024-07-01 DIAGNOSIS — Z7984 Long term (current) use of oral hypoglycemic drugs: Secondary | ICD-10-CM | POA: Diagnosis not present

## 2024-07-01 DIAGNOSIS — T8143XD Infection following a procedure, organ and space surgical site, subsequent encounter: Secondary | ICD-10-CM | POA: Diagnosis not present

## 2024-07-01 LAB — CBC WITH DIFFERENTIAL/PLATELET
Basophils Absolute: 0 K/uL (ref 0.0–0.1)
Basophils Relative: 0.4 % (ref 0.0–3.0)
Eosinophils Absolute: 0 K/uL (ref 0.0–0.7)
Eosinophils Relative: 0.4 % (ref 0.0–5.0)
HCT: 30.1 % — ABNORMAL LOW (ref 36.0–46.0)
Hemoglobin: 10.1 g/dL — ABNORMAL LOW (ref 12.0–15.0)
Lymphocytes Relative: 13.6 % (ref 12.0–46.0)
Lymphs Abs: 1.3 K/uL (ref 0.7–4.0)
MCHC: 33.6 g/dL (ref 30.0–36.0)
MCV: 83.6 fl (ref 78.0–100.0)
Monocytes Absolute: 0.9 K/uL (ref 0.1–1.0)
Monocytes Relative: 9.1 % (ref 3.0–12.0)
Neutro Abs: 7.5 K/uL (ref 1.4–7.7)
Neutrophils Relative %: 76.5 % (ref 43.0–77.0)
Platelets: 537 K/uL — ABNORMAL HIGH (ref 150.0–400.0)
RBC: 3.6 Mil/uL — ABNORMAL LOW (ref 3.87–5.11)
RDW: 16.4 % — ABNORMAL HIGH (ref 11.5–15.5)
WBC: 9.8 K/uL (ref 4.0–10.5)

## 2024-07-01 LAB — AEROBIC/ANAEROBIC CULTURE W GRAM STAIN (SURGICAL/DEEP WOUND)

## 2024-07-01 LAB — BASIC METABOLIC PANEL WITH GFR
BUN: 5 mg/dL — ABNORMAL LOW (ref 6–23)
CO2: 22 meq/L (ref 19–32)
Calcium: 8.8 mg/dL (ref 8.4–10.5)
Chloride: 112 meq/L (ref 96–112)
Creatinine, Ser: 1.08 mg/dL (ref 0.40–1.20)
GFR: 51.33 mL/min — ABNORMAL LOW (ref >60.00)
Glucose, Bld: 85 mg/dL (ref 70–99)
Potassium: 3.1 meq/L — ABNORMAL LOW (ref 3.5–5.1)
Sodium: 142 meq/L (ref 135–145)

## 2024-07-01 MED ORDER — FUROSEMIDE 20 MG PO TABS: 20.0000 mg | ORAL_TABLET | Freq: Every day | ORAL | 3 refills | Status: DC

## 2024-07-01 MED ORDER — POTASSIUM CHLORIDE ER 10 MEQ PO TBCR: 10.0000 meq | EXTENDED_RELEASE_TABLET | Freq: Every day | ORAL | 1 refills | Status: DC

## 2024-07-01 NOTE — Transitions of Care (Post Inpatient/ED Visit) (Signed)
 07/01/2024  Name: Brooke [name] MRN: [id number] DOB: [birth year]  Today's TOC FU Call Status: Today's TOC FU Call Status:: Successful TOC FU Call Completed TOC FU Call Complete Date: 07/01/24  Patient's Name and Date of Birth confirmed. Name, DOB  Transition Care Management Follow-up Telephone Call Date of Discharge: 06/29/24 Discharge Facility: [facility]) Type of Discharge: Inpatient Admission Primary Inpatient Discharge Diagnosis:: Post-op abscess with drain placement How have you been since you were released from the hospital?: Better (I am feeling better after the hospital visit, just weak.  Went to see Dr. Norleen this morning and had a fall but I did not injure myself, was able to get up with help.  Already went over all my medicine with Dr. Norleen and will start taking the fluid pill) Any questions or concerns?: No  Items Reviewed: Did you receive and understand the discharge instructions provided?: Yes (thoroughly reviewed with patient who verbalizes good understanding of same) Medications obtained,verified, and reconciled?: Partial Review Completed (Partial medication review completed; confirmed patient obtained/ is taking all newly Rx'd medications as instructed; self-manages medications and denies questions/ concerns around medications today) Reason for Partial Mediation Review: Patient reports she went over all medications with PCP earlier today during hospital follow up office visit and declined full review during [facility] call; confirms plans to start taking newly prescribed diuretic as soon as it is ready from her outpatient pharmacy- just called in this morning Any new allergies since your discharge?: No Dietary orders reviewed?: Yes Type of Diet Ordered:: Eating as healthy as I can Do you have support at home?: Yes People in Home [RPT]: child(ren), adult Name of Support/Comfort Primary Source: Reports essentialy independent in self-care activities; continues to reside  with supportive son and daughter-in-law: assists as/ if needed/ indicated  Medications Reviewed Today: Medications Reviewed Today     Reviewed by Zahir Eisenhour M, RN (Registered Nurse) on 07/01/24 at 1247  Med List Status: <None>   Medication Order Taking? Sig Documenting Provider Last Dose Status Informant  albuterol  (VENTOLIN  HFA) 108 (90 Base) MCG/ACT inhaler [id number]  Inhale 2 puffs into the lungs every 4 (four) hours as needed for wheezing or shortness of breath. Jeneal Danita Macintosh, MD  Active Pharmacy Records, Child  allopurinol  (ZYLOPRIM ) 100 MG tablet [id number]  TAKE 1 TABLET BY MOUTH EVERY DAY Norleen Lynwood ORN, MD  Active Pharmacy Records, Child  amoxicillin -clavulanate (AUGMENTIN ) 875-125 MG tablet [id number] Yes Take 1 tablet by mouth every 12 (twelve) hours for 4 days. Odell Celinda Balo, MD  Active   atropine  1 % ophthalmic solution [id number]  Place 1 drop into the left eye daily. [provider]  Active Pharmacy Records, Child           Med Note (CRUTHIS, CHLOE C   Sat Jun 27, 2024  2:00 PM) Not found at the pt's home by son and daughter. Unsure if the pt is taking this at home.   brimonidine  (ALPHAGAN ) 0.2 % ophthalmic solution [id number]  3 (three) times daily. 1 drop in the left eye three times daily [provider]  Active Pharmacy Records, Child           Med Note LORNE, CHLOE C   Sat Jun 27, 2024  2:01 PM) Not found at the pt's home by son and daughter. Unsure if the pt is taking this at home.   citalopram  (CELEXA ) 40 MG tablet [id number]  TAKE 1 TABLET BY MOUTH EVERY DAY Norleen Lynwood ORN, MD  Active Pharmacy  Records, Child  clonazePAM  (KLONOPIN ) 0.5 MG tablet [id number]  Take 1 tablet (0.5 mg total) by mouth daily as needed (vertigo). Norleen Lynwood ORN, MD  Active Pharmacy Records, Child  colchicine  0.6 MG tablet [id number]  TAKE 0.5 TABLETS (0.3 MG TOTAL) BY MOUTH DAILY AS NEEDED (GOUT OR PSUEDOGOUT PAIN).  Patient taking differently: Take 0.3 mg by mouth daily  as needed (gout pain).   Joane Artist RAMAN, MD  Active Pharmacy Records, Child  diltiazem  (CARDIZEM  CD) 180 MG 24 hr capsule [id number]  Take 1 capsule (180 mg total) by mouth daily. Shlomo Wilbert SAUNDERS, MD  Active Pharmacy Records, Child  empagliflozin  (JARDIANCE ) 25 MG TABS tablet [id number] Yes Take 25 mg by mouth daily. [provider]  Active Pharmacy Records, Child  erythromycin  ophthalmic ointment [id number]  Place 1 Application into the left eye at bedtime. [provider]  Active Pharmacy Records, Child  famotidine  (PEPCID ) 20 MG tablet [id number]  Take 1 tablet (20 mg total) by mouth 2 (two) times daily. Jeneal Danita Macintosh, MD  Active Pharmacy Records, Child           Med Note LORNE, SHEFFIELD BROCKS   Dju Jun 27, 2024  2:01 PM) Not found at the pt's home by son and daughter. Unsure if the pt is taking this at home.   Fluticasone -Umeclidin-Vilant (TRELEGY ELLIPTA ) 200-62.5-25 MCG/ACT AEPB [id number]  Inhale 1 puff into the lungs daily. Jeneal Danita Macintosh, MD  Active Pharmacy Records, Child           Med Note LORNE, SHEFFIELD BROCKS   Dju Jun 27, 2024  2:01 PM) Not found at the pt's home by son and daughter. Unsure if the pt is taking this at home.   furosemide (LASIX) 20 MG tablet [id number]  Take 1 tablet (20 mg total) by mouth daily. Norleen Lynwood ORN, MD  Active   hydrochlorothiazide  (MICROZIDE ) 12.5 MG capsule [id number]   [provider]  Active Pharmacy Records, Child           Med Note LORNE, CHLOE C   Sat Jun 27, 2024  2:01 PM) Not found at the pt's home by son and daughter. Unsure if the pt is taking this at home.   latanoprost  (XALATAN ) 0.005 % ophthalmic solution [id number]  Place 1 drop into the left eye at bedtime. [provider]  Active Pharmacy Records, Child           Med Note LORNE, CHLOE C   Sat Jun 27, 2024  2:02 PM) Not found at the pt's home by son and daughter. Unsure if the pt is taking this at home.   levocetirizine (XYZAL ) 5 MG tablet  [id number]  Take 1 tablet (5 mg total) by mouth daily. Jeneal Danita Macintosh, MD  Active Pharmacy Records, Child           Med Note LORNE, SHEFFIELD BROCKS   Dju Jun 27, 2024  2:02 PM) Not found at the pt's home by son and daughter. Unsure if the pt is taking this at home.   meclizine  (ANTIVERT ) 25 MG tablet [id number]  TAKE 1 TABLET BY MOUTH 3 TIMES A DAY AS NEEDED FOR DIZZINESS Norleen Lynwood ORN, MD  Active Pharmacy Records, Child           Med Note LORNE, [place] C   Sat Jun 27, 2024  2:02 PM) Not found at the pt's home by son and daughter. Unsure if the pt is taking this at home.   metoprolol  succinate (TOPROL -XL)  100 MG 24 hr tablet [id number]  Take 1 tablet (100 mg total) by mouth daily. Take with or immediately following a meal.  Patient taking differently: Take 100 mg by mouth in the morning and at bedtime.   Cherlyn Labella, MD  Active Pharmacy Records, Child  ondansetron  (ZOFRAN -ODT) 4 MG disintegrating tablet [id number]  Take 1 tablet (4 mg total) by mouth every 8 (eight) hours as needed for nausea or vomiting. Odell Celinda Balo, MD  Active   oxyCODONE -acetaminophen  (PERCOCET) 7.5-325 MG tablet [id number]  Take 1 tablet by mouth every 4 (four) hours as needed for up to 5 days for severe pain (pain score 7-10). Odell Celinda Balo, MD  Active   pantoprazole  (PROTONIX ) 40 MG tablet [id number]  Take 1 tablet (40 mg total) by mouth daily. Cherlyn Labella, MD  Active Pharmacy Records, Child           Med Note LORNE, SHEFFIELD BROCKS   Dju Jun 27, 2024  2:02 PM) Not found at the pt's home by son and daughter. Unsure if the pt is taking this at home.   polyethylene glycol (MIRALAX  / GLYCOLAX ) 17 g packet [id number]  Take 17 g by mouth daily for 4 days. Odell Celinda Balo, MD  Active   potassium chloride  (KLOR-CON  10) 10 MEQ tablet [id number] Yes Take 1 tablet (10 mEq total) by mouth daily for 5 days. Norleen Lynwood ORN, MD  Active   rosuvastatin  (CRESTOR ) 40 MG tablet [id number]  TAKE 1 TABLET BY MOUTH EVERY DAY Shlomo Wilbert SAUNDERS, MD  Active Pharmacy Records, Child  sodium bicarbonate  650 MG tablet [id number] Yes Take 2 tablets (1,300 mg total) by mouth 2 (two) times daily. Sebastian Toribio GAILS, MD  Active Pharmacy Records, Child           Med Note LORNE, SHEFFIELD BROCKS   Dju Jun 27, 2024  2:02 PM) Not found at the pt's home by son and daughter. Unsure if the pt is taking this at home.   traZODone  (DESYREL ) 100 MG tablet [id number]  TAKE 1 TABLET (100 MG TOTAL) BY MOUTH AT BEDTIME AS NEEDED. FOR SLEEP Norleen Lynwood ORN, MD  Active Pharmacy Records, Child           Med Note LORNE, SHEFFIELD BROCKS   Dju Jun 27, 2024  2:02 PM) Not found at the pt's home by son and daughter. Unsure if the pt is taking this at home.            Home Care and Equipment/Supplies: Were Home Health Services Ordered?: Yes Name of Home Health Agency:: [facility] for resumption of care Has Agency set up a time to come to your home?: No (Patient reports she is expecting to hear from agency: confirmed has contact information if she needs to call them) EMR reviewed for Home Health Orders: Orders present/patient has not received call (refer to CM for follow-up) (Successfully enrolled into 30-day TOC program) Any new equipment or medical supplies ordered?: No  Functional Questionnaire: Do you need assistance with bathing/showering or dressing?: Yes (family assisting/ supervising as indicated) Do you need assistance with meal preparation?: Yes (family assisting/ supervising as indicated) Do you need assistance with eating?: No (family assisting/ supervising as indicated) Do you have difficulty maintaining continence: No Do you need assistance with getting out of bed/getting out of a chair/moving?: No Do you have difficulty managing or taking your medications?: No  Follow up appointments reviewed: PCP Follow-up appointment confirmed?: Yes Date of PCP follow-up appointment?: 07/01/24 Follow-up Provider:  PCP- Dr. Norleen: confirmed attended as scheduled Specialist  Hospital Follow-up appointment confirmed?: Yes Date of Specialist follow-up appointment?: 07/03/24 Follow-Up Specialty Provider:: [facility] surgical team Do you need transportation to your follow-up appointment?: No Do you understand care options if your condition(s) worsen?: Yes-patient verbalized understanding  SDOH Interventions Today    Flowsheet Row Most Recent Value  SDOH Interventions   Food Insecurity Interventions Intervention Not Indicated  Housing Interventions Intervention Not Indicated  Transportation Interventions Intervention Not Indicated  [uses [facility]- public transportation at baseline:  family currently providing most of transportation after multiple recent hospitalizations/ surgeries]  Utilities Interventions Intervention Not Indicated   See TOC assessment tabs/ care plan for additional assessment/ TOC intervention information  Successfully enrolled into 30-day TOC program  I appreciate the opportunity to participate in Bronnie's care,   Pls call/ message for questions,  Sharnelle Cappelli Mckinney Everette Dimauro, RN, BSN, CCRN Alumnus RN Care Manager  Transitions of Care  [facility] - [facility] [phone]: direct office

## 2024-07-01 NOTE — Progress Notes (Unsigned)
 Patient ID: Brooke [name], female   DOB: 03/24/1951, 73 y.o.   MRN: [id number]        Chief Complaint: follow up post hospn Dec 4 - 8 2025       HPI:  Brooke [name] is a 74 y.o. female here with c/o        Wt Readings from Last 3 Encounters:  07/01/24 187 lb (84.8 kg)  06/27/24 187 lb 9.8 oz (85.1 kg)  06/20/24 204 lb 2.3 oz (92.6 kg)   BP Readings from Last 3 Encounters:  07/01/24 128/72  06/29/24 134/61  06/21/24 (!) 148/76         Past Medical History:  Diagnosis Date   Alopecia    Anxiety    Aortic atherosclerosis    Asthma    FOLLOWED BY PCP   Cancer of ascending colon pT3pN0 s/p right colectomy 06/12/2024 06/19/2024   Chronic kidney disease, stage 3a (HCC) 06/25/2024   Eczema    GERD (gastroesophageal reflux disease)    Gout    04-28-2018--- per pt stable , as been a while since last episode   Heart murmur    History of colon polyps    History of syncope 2015   Hyperlipidemia    Hypertension    Hypothyroidism    OA (osteoarthritis) of knee    bilateral   PAT (paroxysmal atrial tachycardia)    Occurred in the setting of ileus and electrolyte abnormalities   PVC's (premature ventricular contractions)    Toxic multinodular goiter    03/ 2003  s/p RAI   Type 2 diabetes mellitus (HCC)    followed by pcp   Ventricular tachycardia, polymorphic (HCC) 01/04/2014   primary cardiologist-- dr wilbert turner (hx monitor 2015 showed couplet PVCs, as trigger)   Wears glasses    Wears partial dentures    upper   Past Surgical History:  Procedure Laterality Date   ABDOMINAL HYSTERECTOMY  10/22/1999   WITH BSO   CATARACT EXTRACTION W/ INTRAOCULAR LENS IMPLANT Left YRS AGO   COLONOSCOPY     COLONOSCOPY N/A 06/11/2024   Procedure: COLONOSCOPY;  Surgeon: Stacia Glendia FORBES, MD;  Location: [facility];  Service: Gastroenterology;  Laterality: N/A;   EXCISION ABDOMINAL WALL MASS  12-20-2005   dr merrilyn @[facility]    neruofibroma   LAPAROSCOPIC CHOLECYSTECTOMY  10/01/2002   dr  merrilyn @[facility]    LAPAROSCOPIC PARTIAL COLECTOMY N/A 06/12/2024   Procedure: LAPAROSCOPIC ASSISTED RIGHT COLECTOMY;  Surgeon: Tanda Locus, MD;  Location: [facility];  Service: General;  Laterality: N/A;  LAPAROSCOPIC ASSISTED RIGHT COLECTOMY WITH EXTENDED RESECTION OF DISTAL ILEUM   LEFT HEART CATHETERIZATION WITH CORONARY ANGIOGRAM N/A 01/07/2014   Procedure: LEFT HEART CATHETERIZATION WITH CORONARY ANGIOGRAM;  Surgeon: Peter M Jordan, MD;  Location: [facility] CATH [facility];  Service: Cardiovascular;  Laterality: N/A;   RASTELLI PROCEDURE  6/98 neg   RENAL ARTERY STENT Left 11/2003   angioplasty and stenting   RENAL ARTERY STENT Left 02/2005   re-stenting   TOTAL KNEE ARTHROPLASTY Right 05/05/2018   Procedure: RIGHT TOTAL KNEE ARTHROPLASTY;  Surgeon: Liam Lerner, MD;  Location: [facility];  Service: Orthopedics;  Laterality: Right;   TOTAL KNEE ARTHROPLASTY Left 09/30/2018   Procedure: TOTAL KNEE ARTHROPLASTY;  Surgeon: Sheril Coy, MD;  Location: [facility];  Service: Orthopedics;  Laterality: Left;    reports that she has never smoked. She has never been exposed to tobacco smoke. She has never used smokeless tobacco. She reports that she does not drink alcohol  and does not use drugs. family history includes Diabetes in her mother; Heart disease in her father. Allergies  Allergen Reactions   Gnp Glp-1 Daily Support [Germanium] Other (See Comments)    Ileus requiring hospitalization   Metformin  And Related Nausea And Vomiting   Ace Inhibitors Swelling and Other (See Comments)    Ankles swell   Codeine Nausea And Vomiting and Rash   Hydrocodone  Nausea And Vomiting   Tramadol  Nausea And Vomiting   Tylenol  [Acetaminophen ] Itching, Rash and Other (See Comments)    Allergic to prescription strength Tylenol . The patient is taking 500 mg Tylenol  in 2025, however.   Current Outpatient Medications on File Prior to Visit  Medication Sig Dispense Refill   albuterol  (VENTOLIN  HFA) 108 (90 Base) MCG/ACT inhaler  Inhale 2 puffs into the lungs every 4 (four) hours as needed for wheezing or shortness of breath. 18 g 1   allopurinol  (ZYLOPRIM ) 100 MG tablet TAKE 1 TABLET BY MOUTH EVERY DAY 90 tablet 0   amoxicillin -clavulanate (AUGMENTIN ) 875-125 MG tablet Take 1 tablet by mouth every 12 (twelve) hours for 4 days. 8 tablet 0   atropine  1 % ophthalmic solution Place 1 drop into the left eye daily.     brimonidine  (ALPHAGAN ) 0.2 % ophthalmic solution 3 (three) times daily. 1 drop in the left eye three times daily     citalopram  (CELEXA ) 40 MG tablet TAKE 1 TABLET BY MOUTH EVERY DAY 90 tablet 3   clonazePAM  (KLONOPIN ) 0.5 MG tablet Take 1 tablet (0.5 mg total) by mouth daily as needed (vertigo). 90 tablet 1   colchicine  0.6 MG tablet TAKE 0.5 TABLETS (0.3 MG TOTAL) BY MOUTH DAILY AS NEEDED (GOUT OR PSUEDOGOUT PAIN). (Patient taking differently: Take 0.3 mg by mouth daily as needed (gout pain).) 45 tablet 1   diltiazem  (CARDIZEM  CD) 180 MG 24 hr capsule Take 1 capsule (180 mg total) by mouth daily. 90 capsule 3   empagliflozin  (JARDIANCE ) 25 MG TABS tablet Take 25 mg by mouth daily.     erythromycin  ophthalmic ointment Place 1 Application into the left eye at bedtime.     famotidine  (PEPCID ) 20 MG tablet Take 1 tablet (20 mg total) by mouth 2 (two) times daily. 60 tablet 5   Fluticasone -Umeclidin-Vilant (TRELEGY ELLIPTA ) 200-62.5-25 MCG/ACT AEPB Inhale 1 puff into the lungs daily. 28 each 5   hydrochlorothiazide  (MICROZIDE ) 12.5 MG capsule      latanoprost  (XALATAN ) 0.005 % ophthalmic solution Place 1 drop into the left eye at bedtime.     levocetirizine (XYZAL ) 5 MG tablet Take 1 tablet (5 mg total) by mouth daily. 30 tablet 5   meclizine  (ANTIVERT ) 25 MG tablet TAKE 1 TABLET BY MOUTH 3 TIMES A DAY AS NEEDED FOR DIZZINESS 90 tablet 1   metoprolol  succinate (TOPROL -XL) 100 MG 24 hr tablet Take 1 tablet (100 mg total) by mouth daily. Take with or immediately following a meal. (Patient taking differently: Take 100 mg  by mouth in the morning and at bedtime.) 30 tablet 0   ondansetron  (ZOFRAN -ODT) 4 MG disintegrating tablet Take 1 tablet (4 mg total) by mouth every 8 (eight) hours as needed for nausea or vomiting. 30 tablet 0   oxyCODONE -acetaminophen  (PERCOCET) 7.5-325 MG tablet Take 1 tablet by mouth every 4 (four) hours as needed for up to 5 days for severe pain (pain score 7-10). 20 tablet 0   pantoprazole  (PROTONIX ) 40 MG tablet Take 1 tablet (40 mg total) by mouth daily. 30 tablet 0  polyethylene glycol (MIRALAX  / GLYCOLAX ) 17 g packet Take 17 g by mouth daily for 4 days. 4 packet 0   rosuvastatin  (CRESTOR ) 40 MG tablet TAKE 1 TABLET BY MOUTH EVERY DAY 90 tablet 1   sodium bicarbonate  650 MG tablet Take 2 tablets (1,300 mg total) by mouth 2 (two) times daily. (Patient taking differently: Take 1,300 mg by mouth 2 (two) times daily. 06/22/24: Reports during TOC call currently taking once every day) 120 tablet 1   traZODone  (DESYREL ) 100 MG tablet TAKE 1 TABLET (100 MG TOTAL) BY MOUTH AT BEDTIME AS NEEDED. FOR SLEEP 90 tablet 1   potassium chloride  (KLOR-CON  10) 10 MEQ tablet Take 1 tablet (10 mEq total) by mouth daily for 5 days. 5 tablet 0   No current facility-administered medications on file prior to visit.        ROS:  All others reviewed and negative.  Objective        PE:  BP 128/72 (BP Location: Right Arm, Patient Position: Sitting, Cuff Size: Normal)   Pulse 85   Temp 98.3 F (36.8 C) (Oral)   Ht 5' 3 (1.6 m)   Wt 187 lb (84.8 kg)   SpO2 100%   BMI 33.13 kg/m                 Constitutional: Pt appears in NAD               HENT: Head: NCAT.                Right Ear: External ear normal.                 Left Ear: External ear normal.                Eyes: . Pupils are equal, round, and reactive to light. Conjunctivae and EOM are normal               Nose: without d/c or deformity               Neck: Neck supple. Gross normal ROM               Cardiovascular: Normal rate and regular  rhythm.                 Pulmonary/Chest: Effort normal and breath sounds without rales or wheezing.                Abd:  Soft, NT, ND, + BS, no organomegaly               Neurological: Pt is alert. At baseline orientation, motor grossly intact               Skin: Skin is warm. No rashes, no other new lesions, LE edema - ***               Psychiatric: Pt behavior is normal without agitation   Micro: none  Cardiac tracings I have personally interpreted today:  none  Pertinent Radiological findings (summarize): none   Lab Results  Component Value Date   WBC 8.0 06/28/2024   HGB 9.2 (L) 06/28/2024   HCT 28.4 (L) 06/28/2024   PLT 467 (H) 06/28/2024   GLUCOSE 74 06/27/2024   CHOL 89 (L) 06/01/2024   TRIG 72 06/15/2024   HDL 51 06/01/2024   LDLDIRECT 139.1 09/05/2009   LDLCALC 22 06/01/2024   ALT 45 (H) 06/26/2024   AST 62 (H) 06/26/2024  NA 142 06/27/2024   K 3.8 06/27/2024   CL 113 (H) 06/27/2024   CREATININE 1.25 (H) 06/27/2024   BUN 7 (L) 06/27/2024   CO2 20 (L) 06/27/2024   TSH 2.000 05/13/2024   INR 1.2 06/08/2024   HGBA1C 5.4 06/02/2024   MICROALBUR 1.1 10/02/2023   Assessment/Plan:  Kalisha Keadle Cremeans is a 73 y.o. Black or African American [2] female with  has a past medical history of Alopecia, Anxiety, Aortic atherosclerosis, Asthma, Cancer of ascending colon pT3pN0 s/p right colectomy 06/12/2024 (06/19/2024), Chronic kidney disease, stage 3a (HCC) (06/25/2024), Eczema, GERD (gastroesophageal reflux disease), Gout, Heart murmur, History of colon polyps, History of syncope (2015), Hyperlipidemia, Hypertension, Hypothyroidism, OA (osteoarthritis) of knee, PAT (paroxysmal atrial tachycardia), PVC's (premature ventricular contractions), Toxic multinodular goiter, Type 2 diabetes mellitus (HCC), Ventricular tachycardia, polymorphic (HCC) (01/04/2014), Wears glasses, and Wears partial dentures.  No problem-specific Assessment & Plan notes found for this encounter.  Followup: No  follow-ups on file.  Lynwood Rush, MD 07/01/2024 10:14 AM [facility] [place]
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
## 2024-07-01 NOTE — Patient Instructions (Signed)
 Ok to change the potassium liquid to the pill  Please take all new medication as prescribed - the lasix fluid pill  Ok to restart the jardiance   Please continue all other medications as before, and refills have been done if requested.  Please have the pharmacy call with any other refills you may need.  Please keep your appointments with your specialists as you may have planned - Dr Tanda Dec 12  Please go to the LAB at the blood drawing area for the tests to be done  You will be contacted by phone if any changes need to be made immediately.  Otherwise, you will receive a letter about your results with an explanation, but please check with MyChart first.  Please make an Appointment to return in 6 months, or sooner if needed

## 2024-07-03 ENCOUNTER — Encounter: Payer: Self-pay | Admitting: Internal Medicine

## 2024-07-03 ENCOUNTER — Telehealth: Payer: Self-pay

## 2024-07-03 NOTE — Assessment & Plan Note (Addendum)
 Worsening leg edema holding diuretic, pt to now restart, and change the potassium liquid to klor con 10 meq qd

## 2024-07-03 NOTE — Telephone Encounter (Signed)
 Spoke with ms. Marko CANDIE Pac verbal orders she confirmed

## 2024-07-03 NOTE — Assessment & Plan Note (Signed)
 Nicely improved, pt to finish augmentin , f/u surgury dec 12

## 2024-07-03 NOTE — Assessment & Plan Note (Addendum)
 Lab Results  Component Value Date   HGBA1C 5.4 06/02/2024   Stable, pt to restart current medical treatment - jardiance  25 qd Without insulin , with hyperglycemia

## 2024-07-03 NOTE — Telephone Encounter (Signed)
 Copied from CRM #[id number]. Topic: Clinical - Home Health Verbal Orders >> Jul 03, 2024  1:42 PM Brittany M wrote: Caller/Agency: [facility] Callback Number: [phone] Service Requested: Physical Therapy Frequency: 2 x 3 ; 1x 3 Any new concerns about the patient? No

## 2024-07-07 ENCOUNTER — Ambulatory Visit (HOSPITAL_COMMUNITY)

## 2024-07-07 ENCOUNTER — Ambulatory Visit: Admitting: Physician Assistant

## 2024-07-07 ENCOUNTER — Ambulatory Visit: Payer: Self-pay

## 2024-07-07 NOTE — Telephone Encounter (Signed)
 FYI Only or Action Required?: FYI only for provider: appointment scheduled on 12/18.  Patient was last seen in primary care on 07/01/2024 by Norleen Lynwood ORN, MD.  Called Nurse Triage reporting Hip Pain.  Symptoms began several days ago.  Interventions attempted: Rest, hydration, or home remedies.  Symptoms are: gradually worsening.  Triage Disposition: See PCP When Office is Open (Within 3 Days)  Patient/caregiver understands and will follow disposition?: Yes, will follow disposition   Copied from CRM [id number]. Topic: Clinical - Red Word Triage >> Jul 07, 2024 11:33 AM China J wrote: [facility] that prompted transfer to Nurse Triage: Patient had a fall on Wednesday and has been having increased on her left hip. Reason for Disposition  [1] MODERATE pain (e.g., interferes with normal activities, limping) AND [2] present > 3 days  Answer Assessment - Initial Assessment Questions 1. LOCATION and RADIATION: Where is the pain located? Does the pain spread (shoot) anywhere else?     L hip, does not radiate 2. QUALITY: What does the pain feel like?  (e.g., sharp, dull, aching, burning)     Sharp pain 3. SEVERITY: How bad is the pain? What does it keep you from doing?   (Scale 1-10; or mild, moderate, severe)     9 4. ONSET: When did the pain start? Does it come and go, or is it there all the time?     About a week, after pt fell at PCP clinic, staff did assist pt after fall 5. WORK OR EXERCISE: Has there been any recent work or exercise that involved this part of the body?      fall 6. CAUSE: What do you think is causing the hip pain?      Injury from fall 7. AGGRAVATING FACTORS: What makes the hip pain worse? (e.g., walking, climbing stairs, running)    denies 8. OTHER SYMPTOMS: Do you have any other symptoms? (e.g., back pain, pain shooting down leg,  fever, rash)     Denies  Pt fell at last visit to clinic, states the hip has been bothersome since her fall. Pt  states that staff did help the pt after the fall. Pt is unsure if the PCP is aware of the fall.  Protocols used: Hip Pain-A-AH
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
## 2024-07-08 ENCOUNTER — Ambulatory Visit: Admitting: Physician Assistant

## 2024-07-08 ENCOUNTER — Encounter: Payer: Self-pay | Admitting: Physician Assistant

## 2024-07-08 VITALS — BP 120/56 | HR 64 | Ht 63.0 in | Wt 180.1 lb

## 2024-07-08 DIAGNOSIS — C182 Malignant neoplasm of ascending colon: Secondary | ICD-10-CM | POA: Diagnosis not present

## 2024-07-09 ENCOUNTER — Telehealth: Admitting: *Deleted

## 2024-07-09 ENCOUNTER — Encounter: Payer: Self-pay | Admitting: *Deleted

## 2024-07-09 ENCOUNTER — Ambulatory Visit: Admitting: Internal Medicine

## 2024-07-09 ENCOUNTER — Other Ambulatory Visit: Payer: Self-pay

## 2024-07-09 NOTE — Assessment & Plan Note (Signed)
 pT3N0M0, stage II, MMR normal, G2 -she was admitted on June 07, 2024 for abdominal pain, nausea and vomiting, workup showed ascending colon cancer.  She underwent surgical resection on 06/12/2024.  -I recommend ctDNA and cancer surveillance if negative.

## 2024-07-10 ENCOUNTER — Telehealth: Payer: Self-pay

## 2024-07-10 ENCOUNTER — Encounter (HOSPITAL_COMMUNITY): Payer: Self-pay

## 2024-07-10 ENCOUNTER — Ambulatory Visit (HOSPITAL_COMMUNITY): Admission: RE | Admit: 2024-07-10 | Discharge: 2024-07-10 | Attending: General Surgery | Admitting: General Surgery

## 2024-07-10 ENCOUNTER — Inpatient Hospital Stay: Attending: Hematology

## 2024-07-10 ENCOUNTER — Other Ambulatory Visit: Payer: Self-pay

## 2024-07-10 ENCOUNTER — Inpatient Hospital Stay (HOSPITAL_COMMUNITY): Admission: RE | Admit: 2024-07-10 | Discharge: 2024-07-10

## 2024-07-10 ENCOUNTER — Inpatient Hospital Stay: Admitting: Hematology

## 2024-07-10 ENCOUNTER — Observation Stay (HOSPITAL_COMMUNITY)
Admission: EM | Admit: 2024-07-10 | Discharge: 2024-07-11 | Disposition: A | Source: Ambulatory Visit | Attending: Emergency Medicine | Admitting: Emergency Medicine

## 2024-07-10 VITALS — BP 138/57 | HR 72 | Temp 97.7°F | Resp 17 | Wt 178.1 lb

## 2024-07-10 DIAGNOSIS — C182 Malignant neoplasm of ascending colon: Secondary | ICD-10-CM | POA: Insufficient documentation

## 2024-07-10 DIAGNOSIS — N1831 Chronic kidney disease, stage 3a: Secondary | ICD-10-CM | POA: Diagnosis not present

## 2024-07-10 DIAGNOSIS — E66811 Obesity, class 1: Secondary | ICD-10-CM | POA: Insufficient documentation

## 2024-07-10 DIAGNOSIS — T8143XA Infection following a procedure, organ and space surgical site, initial encounter: Secondary | ICD-10-CM | POA: Insufficient documentation

## 2024-07-10 DIAGNOSIS — I700 Atherosclerosis of aorta: Secondary | ICD-10-CM | POA: Insufficient documentation

## 2024-07-10 DIAGNOSIS — E870 Hyperosmolality and hypernatremia: Secondary | ICD-10-CM | POA: Insufficient documentation

## 2024-07-10 DIAGNOSIS — J45909 Unspecified asthma, uncomplicated: Secondary | ICD-10-CM | POA: Insufficient documentation

## 2024-07-10 DIAGNOSIS — Z6831 Body mass index (BMI) 31.0-31.9, adult: Secondary | ICD-10-CM | POA: Insufficient documentation

## 2024-07-10 DIAGNOSIS — I129 Hypertensive chronic kidney disease with stage 1 through stage 4 chronic kidney disease, or unspecified chronic kidney disease: Secondary | ICD-10-CM | POA: Insufficient documentation

## 2024-07-10 DIAGNOSIS — E785 Hyperlipidemia, unspecified: Secondary | ICD-10-CM | POA: Insufficient documentation

## 2024-07-10 DIAGNOSIS — K651 Peritoneal abscess: Secondary | ICD-10-CM | POA: Insufficient documentation

## 2024-07-10 DIAGNOSIS — E876 Hypokalemia: Principal | ICD-10-CM | POA: Diagnosis present

## 2024-07-10 DIAGNOSIS — K8689 Other specified diseases of pancreas: Secondary | ICD-10-CM | POA: Diagnosis not present

## 2024-07-10 DIAGNOSIS — F419 Anxiety disorder, unspecified: Secondary | ICD-10-CM | POA: Diagnosis not present

## 2024-07-10 DIAGNOSIS — R911 Solitary pulmonary nodule: Secondary | ICD-10-CM | POA: Insufficient documentation

## 2024-07-10 DIAGNOSIS — F32A Depression, unspecified: Secondary | ICD-10-CM | POA: Insufficient documentation

## 2024-07-10 DIAGNOSIS — D631 Anemia in chronic kidney disease: Secondary | ICD-10-CM | POA: Insufficient documentation

## 2024-07-10 HISTORY — PX: IR CV LINE INJECTION: IMG2294

## 2024-07-10 LAB — CBC WITH DIFFERENTIAL (CANCER CENTER ONLY)
Abs Immature Granulocytes: 0.04 K/uL (ref 0.00–0.07)
Basophils Absolute: 0 K/uL (ref 0.0–0.1)
Basophils Relative: 0 %
Eosinophils Absolute: 0 K/uL (ref 0.0–0.5)
Eosinophils Relative: 0 %
HCT: 31.6 % — ABNORMAL LOW (ref 36.0–46.0)
Hemoglobin: 10.5 g/dL — ABNORMAL LOW (ref 12.0–15.0)
Immature Granulocytes: 0 %
Lymphocytes Relative: 21 %
Lymphs Abs: 1.9 K/uL (ref 0.7–4.0)
MCH: 27 pg (ref 26.0–34.0)
MCHC: 33.2 g/dL (ref 30.0–36.0)
MCV: 81.2 fL (ref 80.0–100.0)
Monocytes Absolute: 1 K/uL (ref 0.1–1.0)
Monocytes Relative: 11 %
Neutro Abs: 5.9 K/uL (ref 1.7–7.7)
Neutrophils Relative %: 68 %
Platelet Count: 363 K/uL (ref 150–400)
RBC: 3.89 MIL/uL (ref 3.87–5.11)
RDW: 16.6 % — ABNORMAL HIGH (ref 11.5–15.5)
WBC Count: 9 K/uL (ref 4.0–10.5)
nRBC: 0 % (ref 0.0–0.2)

## 2024-07-10 LAB — CMP (CANCER CENTER ONLY)
ALT: 13 U/L (ref 0–44)
AST: 23 U/L (ref 15–41)
Albumin: 2.6 g/dL — ABNORMAL LOW (ref 3.5–5.0)
Alkaline Phosphatase: 238 U/L — ABNORMAL HIGH (ref 38–126)
Anion gap: 14 (ref 5–15)
BUN: <5 mg/dL — ABNORMAL LOW (ref 8–23)
CO2: 23 mmol/L (ref 22–32)
Calcium: 8.2 mg/dL — ABNORMAL LOW (ref 8.9–10.3)
Chloride: 108 mmol/L (ref 98–111)
Creatinine: 1.25 mg/dL — ABNORMAL HIGH (ref 0.44–1.00)
GFR, Estimated: 46 mL/min — ABNORMAL LOW
Glucose, Bld: 89 mg/dL (ref 70–99)
Potassium: 2.4 mmol/L — CL (ref 3.5–5.1)
Sodium: 145 mmol/L (ref 135–145)
Total Bilirubin: 0.3 mg/dL (ref 0.0–1.2)
Total Protein: 5.9 g/dL — ABNORMAL LOW (ref 6.5–8.1)

## 2024-07-10 LAB — MAGNESIUM: Magnesium: 1.4 mg/dL — ABNORMAL LOW (ref 1.7–2.4)

## 2024-07-10 LAB — GENETIC SCREENING ORDER

## 2024-07-10 MED ORDER — HYDRALAZINE HCL 20 MG/ML IJ SOLN: 5.0000 mg | INTRAMUSCULAR | Status: DC | PRN

## 2024-07-10 MED ORDER — ACETAMINOPHEN 325 MG PO TABS
650.0000 mg | ORAL_TABLET | Freq: Four times a day (QID) | ORAL | Status: DC | PRN
  Administered 2024-07-10: 650 mg via ORAL
  Filled 2024-07-10: qty 2

## 2024-07-10 MED ORDER — POTASSIUM CHLORIDE 10 MEQ/100ML IV SOLN
10.0000 meq | INTRAVENOUS | Status: AC
  Administered 2024-07-10 (×4): 10 meq via INTRAVENOUS
  Filled 2024-07-10 (×2): qty 100

## 2024-07-10 MED ORDER — ONDANSETRON HCL 4 MG/2ML IJ SOLN
4.0000 mg | Freq: Four times a day (QID) | INTRAMUSCULAR | Status: DC | PRN
  Administered 2024-07-11: 4 mg via INTRAVENOUS
  Filled 2024-07-10: qty 2

## 2024-07-10 MED ORDER — IOHEXOL 300 MG/ML  SOLN
80.0000 mL | Freq: Once | INTRAMUSCULAR | Status: AC | PRN
  Administered 2024-07-10: 80 mL via INTRAVENOUS

## 2024-07-10 MED ORDER — ALBUTEROL SULFATE (2.5 MG/3ML) 0.083% IN NEBU: 2.5000 mg | INHALATION_SOLUTION | RESPIRATORY_TRACT | Status: DC | PRN

## 2024-07-10 MED ORDER — POTASSIUM CHLORIDE CRYS ER 20 MEQ PO TBCR
60.0000 meq | EXTENDED_RELEASE_TABLET | Freq: Once | ORAL | Status: AC
  Administered 2024-07-10: 60 meq via ORAL
  Filled 2024-07-10: qty 3

## 2024-07-10 MED ORDER — IOHEXOL 300 MG/ML  SOLN
50.0000 mL | Freq: Once | INTRAMUSCULAR | Status: AC | PRN
  Administered 2024-07-10: 20 mL

## 2024-07-10 MED ORDER — ONDANSETRON HCL 4 MG PO TABS: 4.0000 mg | ORAL_TABLET | Freq: Four times a day (QID) | ORAL | Status: DC | PRN

## 2024-07-10 MED ORDER — MAGNESIUM SULFATE 2 GM/50ML IV SOLN
2.0000 g | Freq: Once | INTRAVENOUS | Status: AC
  Administered 2024-07-10: 2 g via INTRAVENOUS
  Filled 2024-07-10: qty 50

## 2024-07-10 MED ORDER — LACTATED RINGERS IV SOLN: INTRAVENOUS | Status: DC

## 2024-07-10 MED ORDER — SENNOSIDES-DOCUSATE SODIUM 8.6-50 MG PO TABS: 1.0000 | ORAL_TABLET | Freq: Every evening | ORAL | Status: DC | PRN

## 2024-07-10 NOTE — Telephone Encounter (Signed)
 Spoke with [facility] ED CN regarding pt's hypokalemia.  Stated pt is currently in IR for drain evaluation and will be coming to the ED regarding her hypokalemia.  Stated pt's K+ 2.9 today and it's normally managed by pt's PCP but d/t pt was seen today in clinic by Dr Lanny.  [facility] ED CN stated to have pt come to ED Triage when finished in IR.  Notified IR Team of conversation w/ED CN via Secure Chat.

## 2024-07-10 NOTE — Progress Notes (Signed)
"   ° °  Subjective/Chief Complaint: No complaints. Pt known to surgery service   Objective: Vital signs in last 24 hours: Temp:  [97.7 F (36.5 C)-98.2 F (36.8 C)] 98.2 F (36.8 C) (12/19 1306) Pulse Rate:  [66-72] 66 (12/19 1306) Resp:  [17-19] 19 (12/19 1306) BP: (138-169)/(57-80) 169/80 (12/19 1306) SpO2:  [100 %] 100 % (12/19 1306) Weight:  [80.7 kg-80.8 kg] 80.7 kg (12/19 1306)    Intake/Output from previous day: No intake/output data recorded. Intake/Output this shift: No intake/output data recorded.  General appearance: alert and cooperative Resp: clear to auscultation bilaterally Cardio: regular rate and rhythm GI: soft, nontender. Drain output bloody  Lab Results:  Recent Labs    07/10/24 0858  WBC 9.0  HGB 10.5*  HCT 31.6*  PLT 363   BMET Recent Labs    07/10/24 0858  NA 145  K 2.4*  CL 108  CO2 23  GLUCOSE 89  BUN <5*  CREATININE 1.25*  CALCIUM  8.2*   PT/INR No results for input(s): LABPROT, INR in the last 72 hours. ABG No results for input(s): PHART, HCO3 in the last 72 hours.  Invalid input(s): PCO2, PO2  Studies/Results: No results found.  Anti-infectives: Anti-infectives (From admission, onward)    None       Assessment/Plan: s/p * No surgery found * Recent partial colectomy complicated by hematoma requiring drain Drain study today shows catheter to be in good position within hematoma which is smaller than on previous study. Catheter should remain in place. Pt should follow up with her surgeon as scheduled and continue follow up in drain clinic to assess hematoma per their protocol.   LOS: 0 days    Deward Null III 07/10/2024  "

## 2024-07-10 NOTE — Progress Notes (Signed)
 " [facility]   [phone] Fax:([phone]   Clinic Follow up Note   Patient Care Team: Norleen Lynwood ORN, MD as PCP - General (Internal Medicine) Shlomo Wilbert SAUNDERS, MD as PCP - Cardiology (Cardiology) Sheril Coy, MD as Consulting Physician (Orthopedic Surgery) Robinson Mayo, OD as Referring Physician (Optometry) Szabat, Toribio BROCKS, [facility] (Inactive) as Pharmacist (Pharmacist) Silva Juliene SAUNDERS, DPM as Consulting Physician (Podiatry) Tousey, Laine M, RN as VBCI Care Management  Date of Service:  07/10/2024  CHIEF COMPLAINT: f/u of colon cancer  CURRENT THERAPY:  Cancer surveillance  Oncology History   Cancer of ascending colon pT3pN0 s/p right colectomy 06/12/2024 pT3N0M0, stage II, MMR normal, G2 -she was admitted on June 07, 2024 for abdominal pain, nausea and vomiting, workup showed ascending colon cancer.  She underwent surgical resection on 06/12/2024.  -I recommend ctDNA and cancer surveillance if negative.   Assessment & Plan Stage II colon cancer, status post resection She is post-resection for stage II colon cancer and is not receiving adjuvant chemotherapy, as it is not indicated. She remains at risk for recurrence (up to 20%) and is under active surveillance, including circulating tumor DNA testing and scheduled imaging. - Ordered circulating tumor DNA blood test today to assess for microscopic residual disease. - Planned annual surveillance CT scan. - Scheduled follow-up visits every 3-4 months for the first 2-3 years, then less frequently for a total of five years of follow-up. - Will communicate results of today's laboratory tests when available. - Instructed her to schedule the next appointment upon departure.  Anemia secondary to recent surgery and drainage tube She has mild anemia, likely secondary to recent surgery and drainage tube, with most recent hemoglobin 10.5 g/dL. She is adherent to oral iron supplementation. - Advised  continuation of oral iron supplementation. - Will repeat laboratory evaluation at next follow-up visit in four months.  Hypokalemia - Patient has chronic intermittent hypokalemia, probably related to her furosemide  - Potassium 2.4 today, needs IV potassium.  We instructed patient to go to emergency room  Plan - Lab reviewed, she is clinically stable, will continue cancer  Surveillance - We obtained Signatera today, we will call her with results next months.  If it is positive, I will see her back to discuss adjuvant chemo. - Lab and follow-up in 4 months - Due to her severe hypokalemia which required IV replacement, I sent patient to [facility] emergency room.    Discussed the use of AI scribe software for clinical note transcription with the patient, who gave verbal consent to proceed.  History of Present Illness Brooke [name] is a 73 year old female with stage II ascending colon cancer, status post surgical resection, who presents for oncology follow-up and surveillance.  She underwent surgical resection for stage II ascending colon cancer and is not receiving adjuvant chemotherapy. She is monitored with surveillance blood tests, including circulating tumor DNA drawn today, and scheduled imaging. Circulating tumor DNA results are pending.  She has a postoperative drainage tube in place with a slightly open midline incision and minor bleeding at the drainage site, which she finds bothersome. She is scheduled for a CT scan and a surgical follow-up for wound and tube management. She is able to eat and is healing, and is currently fasting for imaging.  She is taking oral iron for mild anemia, with a recent hemoglobin of 10.5 g/dL, and confirms adherence. She denies fever, chills, or other systemic symptoms.     All other systems were reviewed  with the patient and are negative.  MEDICAL HISTORY:  Past Medical History:  Diagnosis Date   Alopecia    Anxiety    Aortic atherosclerosis     Asthma    FOLLOWED BY PCP   Cancer of ascending colon pT3pN0 s/p right colectomy 06/12/2024 06/19/2024   Chronic kidney disease, stage 3a (HCC) 06/25/2024   Eczema    GERD (gastroesophageal reflux disease)    Gout    04-28-2018--- per pt stable , as been a while since last episode   Heart murmur    History of colon polyps    History of syncope 2015   Hyperlipidemia    Hypertension    Hypothyroidism    OA (osteoarthritis) of knee    bilateral   PAT (paroxysmal atrial tachycardia)    Occurred in the setting of ileus and electrolyte abnormalities   PVC's (premature ventricular contractions)    Toxic multinodular goiter    03/ 2003  s/p RAI   Type 2 diabetes mellitus (HCC)    followed by pcp   Ventricular tachycardia, polymorphic (HCC) 01/04/2014   primary cardiologist-- dr wilbert turner (hx monitor 2015 showed couplet PVCs, as trigger)   Wears glasses    Wears partial dentures    upper    SURGICAL HISTORY: Past Surgical History:  Procedure Laterality Date   ABDOMINAL HYSTERECTOMY  10/22/1999   WITH BSO   CATARACT EXTRACTION W/ INTRAOCULAR LENS IMPLANT Left YRS AGO   COLONOSCOPY     COLONOSCOPY N/A 06/11/2024   Procedure: COLONOSCOPY;  Surgeon: Stacia Glendia BRAVO, MD;  Location: [facility];  Service: Gastroenterology;  Laterality: N/A;   EXCISION ABDOMINAL WALL MASS  12-20-2005   dr merrilyn @[facility]    neruofibroma   IR CV LINE INJECTION  07/10/2024   LAPAROSCOPIC CHOLECYSTECTOMY  10/01/2002   dr merrilyn @[facility]    LAPAROSCOPIC PARTIAL COLECTOMY N/A 06/12/2024   Procedure: LAPAROSCOPIC ASSISTED RIGHT COLECTOMY;  Surgeon: Tanda Locus, MD;  Location: [facility];  Service: General;  Laterality: N/A;  LAPAROSCOPIC ASSISTED RIGHT COLECTOMY WITH EXTENDED RESECTION OF DISTAL ILEUM   LEFT HEART CATHETERIZATION WITH CORONARY ANGIOGRAM N/A 01/07/2014   Procedure: LEFT HEART CATHETERIZATION WITH CORONARY ANGIOGRAM;  Surgeon: Peter M Jordan, MD;  Location: [facility] C[facility] LAB;  Service: Cardiovascular;   Laterality: N/A;   RASTELLI PROCEDURE  6/98 neg   RENAL ARTERY STENT Left 11/2003   angioplasty and stenting   RENAL ARTERY STENT Left 02/2005   re-stenting   TOTAL KNEE ARTHROPLASTY Right 05/05/2018   Procedure: RIGHT TOTAL KNEE ARTHROPLASTY;  Surgeon: Liam Lerner, MD;  Location: [facility];  Service: Orthopedics;  Laterality: Right;   TOTAL KNEE ARTHROPLASTY Left 09/30/2018   Procedure: TOTAL KNEE ARTHROPLASTY;  Surgeon: Sheril Coy, MD;  Location: [facility];  Service: Orthopedics;  Laterality: Left;    I have reviewed the social history and family history with the patient and they are unchanged from previous note.  ALLERGIES:  is allergic to gnp glp-1 daily support [germanium], metformin  and related, ace inhibitors, codeine, hydrocodone , tramadol , and tylenol  [acetaminophen ].  MEDICATIONS:  No current facility-administered medications for this visit.   No current outpatient medications on file.   Facility-Administered Medications Ordered in Other Visits  Medication Dose Route Frequency Provider Last Rate Last Admin   acetaminophen  (TYLENOL ) tablet 650 mg  650 mg Oral Q6H PRN Cheryle Page, MD   650 mg at 07/10/24 1837   albuterol  (PROVENTIL ) (2.5 MG/3ML) 0.083% nebulizer solution 2.5 mg  2.5 mg Nebulization Q2H PRN Cheryle Page, MD  hydrALAZINE  (APRESOLINE ) injection 5 mg  5 mg Intravenous Q4H PRN Cheryle Page, MD       lactated ringers  infusion   Intravenous Continuous Dasie Faden, MD 125 mL/hr at 07/10/24 1616 Infusion Verify at 07/10/24 1616   magnesium  sulfate IVPB 2 g 50 mL  2 g Intravenous Once Cheryle Page, MD 50 mL/hr at 07/10/24 2055 2 g at 07/10/24 2055   ondansetron  (ZOFRAN ) tablet 4 mg  4 mg Oral Q6H PRN Cheryle Page, MD       Or   ondansetron  (ZOFRAN ) injection 4 mg  4 mg Intravenous Q6H PRN Cheryle, Kshitiz, MD       senna-docusate (Senokot-S) tablet 1 tablet  1 tablet Oral QHS PRN Cheryle Page, MD        PHYSICAL EXAMINATION: ECOG PERFORMANCE STATUS:  1 - Symptomatic but completely ambulatory  Vitals:   07/10/24 0950  BP: (!) 138/57  Pulse: 72  Resp: 17  Temp: 97.7 F (36.5 C)  SpO2: 100%   Wt Readings from Last 3 Encounters:  07/10/24 175 lb 5 oz (79.5 kg)  07/10/24 178 lb 1.6 oz (80.8 kg)  07/08/24 180 lb 2 oz (81.7 kg)     GENERAL:alert, no distress and comfortable SKIN: skin color, texture, turgor are normal, no rashes or significant lesions EYES: normal, Conjunctiva are pink and non-injected, sclera clear NECK: supple, thyroid  normal size, non-tender, without nodularity LYMPH:  no palpable lymphadenopathy in the cervical, axillary  LUNGS: clear to auscultation and percussion with normal breathing effort HEART: regular rate & rhythm and no murmurs and no lower extremity edema ABDOMEN:abdomen soft, non-tender and normal bowel sounds Musculoskeletal:no cyanosis of digits and no clubbing  NEURO: alert & oriented x 3 with fluent speech, no focal motor/sensory deficits  Physical Exam CHEST: Lungs clear to auscultation bilaterally.  LABORATORY DATA:  I have reviewed the data as listed    Latest Ref Rng & Units 07/10/2024    8:58 AM 07/01/2024   10:41 AM 06/28/2024    4:49 AM  CBC  WBC 4.0 - 10.5 K/uL 9.0  9.8  8.0   Hemoglobin 12.0 - 15.0 g/dL 89.4  89.8  9.2   Hematocrit 36.0 - 46.0 % 31.6  30.1  28.4   Platelets 150 - 400 K/uL 363  537.0  467         Latest Ref Rng & Units 07/10/2024    8:58 AM 07/01/2024   10:41 AM 06/27/2024    5:40 AM  CMP  Glucose 70 - 99 mg/dL 89  85  74   BUN 8 - 23 mg/dL 5  5  7    Creatinine 0.44 - 1.00 mg/dL 8.74  8.91  8.74   Sodium 135 - 145 mmol/L 145  142  142   Potassium 3.5 - 5.1 mmol/L 2.4  3.1  3.8   Chloride 98 - 111 mmol/L 108  112  113   CO2 22 - 32 mmol/L 23  22  20    Calcium  8.9 - 10.3 mg/dL 8.2  8.8  8.1   Total Protein 6.5 - 8.1 g/dL 5.9     Total Bilirubin 0.0 - 1.2 mg/dL 0.3     Alkaline Phos 38 - 126 U/L 238     AST 15 - 41 U/L 23     ALT 0 - 44 U/L 13          RADIOGRAPHIC STUDIES: I have personally reviewed the radiological images as listed and agreed with the findings in the  report. IR INJECT INDWELLING DRAINAGE CATHETER Result Date: 07/10/2024 INDICATION: 73 year old with colon cancer and status post partial colectomy. Patient developed a postoperative hematoma. Drain was placed on 06/26/2024. Patient reports a small amount of bloody output each day. Today's CT demonstrates that the postoperative hematoma has decreased in size. EXAM: DRAIN INJECTION WITH FLUOROSCOPY MEDICATIONS: None ANESTHESIA/SEDATION: None FLUOROSCOPY TIME:  Radiation Exposure Index (as provided by the fluoroscopic device): 11 mGy Kerma CONTRAST:  20 mL Omnipaque  300 COMPLICATIONS: None immediate. PROCEDURE: Patient was placed supine on the interventional table. The right upper quadrant was evaluated with ultrasound. The right upper abdominal collection was noted to be echogenic and heterogeneous. The ultrasound findings are compatible with a complex hematoma. Scout image was obtained. The drain was injected with contrast under fluoroscopy. Contrast filled the irregular hematoma but there was no evidence for a bowel fistula. Contrast was aspirated. Drain was flushed with saline and attached to a suction bulb. Fluoroscopic images were taken and saved for this procedure. FINDINGS: Drain is well positioned within the postoperative hematoma. No evidence for a bowel fistula. IMPRESSION: 1. Percutaneous drain is well positioned within the complex postoperative hematoma. 2. Instructed the patient to continue with routine drain flushing. Patient will be scheduled in drain clinic in approximately 2 weeks. Electronically Signed   By: Juliene Balder M.D.   On: 07/10/2024 16:55   CT ABDOMEN PELVIS W CONTRAST Result Date: 07/10/2024 CLINICAL DATA:  73 year old with colon cancer and status post right hemicolectomy. Patient developed a postoperative hematoma. Percutaneous drain was placed on  06/26/2024. Patient reports a small volume of bloody output from the drain each day. EXAM: CT ABDOMEN AND PELVIS WITH CONTRAST TECHNIQUE: Multidetector CT imaging of the abdomen and pelvis was performed using the standard protocol following bolus administration of intravenous contrast. RADIATION DOSE REDUCTION: This exam was performed according to the departmental dose-optimization program which includes automated exposure control, adjustment of the mA and/or kV according to patient size and/or use of iterative reconstruction technique. CONTRAST:  80mL OMNIPAQUE  IOHEXOL  300 MG/ML  SOLN COMPARISON:  06/25/2024 FINDINGS: Lower chest: Again noted is 3 mm nodular density at the left lung base on image 25, sequence 4 that is indeterminate. Remainder of the lung bases are clear. No pleural effusions. Hepatobiliary: Cholecystectomy. Mild intrahepatic and extrahepatic biliary dilatation. Intrahepatic biliary dilatation may be slightly increased from the recent comparison examination. Trace perihepatic ascites near the anterior right hemidiaphragm. Main portal venous system is patent. Again noted are scattered hepatic cysts. Pancreas: Fatty replacement of the pancreas but there is a focal nodular structure at the pancreatic tail on image 25, image 2. This area measures up to 8 mm. There was a similar finding on the MRI from 01/07/2022. Not clear if this represents a lesion versus normal pancreatic tissue without fatty replacement. Spleen: Normal in size without focal abnormality. Adrenals/Urinary Tract: Normal adrenal glands. Again noted is a cyst in the left kidney upper pole that does not require dedicated follow-up. Adrenal glands are within normal limits. No hydronephrosis. Small cyst in the left kidney lower pole. Normal appearance of the urinary bladder. Stomach/Bowel: Normal appearance of the stomach. Status post right hemicolectomy. There is no evidence for bowel dilatation or focal bowel inflammation. Diverticula  involving the descending colon and sigmoid colon. Vascular/Lymphatic: Atherosclerotic disease in the abdominal aorta without aneurysm. Left renal artery stent. Again noted are small lymph nodes on the right side of the pelvis on image 71/2. Index lymph node measures 0.8 cm in the short axis. Findings  are similar to the prior examination but these lymph nodes are indeterminate. Indeterminate soft tissue in the anterior abdomen on image 32/2 which could be postoperative in etiology but difficult to exclude subtle omental disease and this area needs attention on follow-up. Reproductive: Status post hysterectomy. No adnexal masses. Other: The large right upper abdominal fluid collection has decreased in size since placement of the percutaneous drain. The right anterior percutaneous drain is well positioned within the collection. Collection measures 6.1 x 6.4 x 6.2 cm on image 29/2 and previously measured 8.2 x 6.4 x 7.8 cm at a similar level. Decreased gas within this collection. Persistent inflammatory changes in the right upper quadrant of the abdomen. No new fluid or abscess collections. Musculoskeletal: Again noted are postoperative changes along the anterior abdominal wall with evidence of a healing wound. Persistent gas in the deep portion of the abdominal wound. No acute bone abnormality. IMPRESSION: 1. Decreased size of the right upper abdominal fluid collection following percutaneous drain placement. The drain is well positioned within the collection. Findings are compatible with a slowly resolving postoperative hematoma. 2. Persistent inflammatory changes in the right upper quadrant of the abdomen. No new fluid or abscess collections. 3. Indeterminate soft tissue in the anterior abdomen which could be postoperative in etiology but difficult to exclude subtle omental disease in this area. Recommend attention to this area on follow up. 4. Indeterminate 8 mm nodular structure at the pancreatic tail. This finding  has not significantly changed since 2023. There is diffuse fatty replacement in the pancreas and it is possible that this nodular area could represent normal pancreatic tissue but difficult to exclude a small lesion. Recommend attention on follow-up imaging. 5. Indeterminate 3 mm nodule at the left lung base. Recommend attention on follow-up imaging 6. Aortic Atherosclerosis (ICD10-I70.0). Electronically Signed   By: Juliene Balder M.D.   On: 07/10/2024 16:46      No orders of the defined types were placed in this encounter.  All questions were answered. The patient knows to call the clinic with any problems, questions or concerns. No barriers to learning was detected. The total time spent in the appointment was 25 minutes, including review of chart and various tests results, discussions about plan of care and coordination of care plan     Onita Mattock, MD 07/10/2024      "

## 2024-07-10 NOTE — Progress Notes (Signed)
 Spoke with pt via telephone regarding her low K+ level.  Informed pt that her K+ is 2.4 today and Dr Lanny would like for the pt to go to the ED d/t this.  Pt stated she was currently in the Radiology Dept here at [facility] for her CT Scan and then will go to IR.

## 2024-07-10 NOTE — Progress Notes (Signed)
 PATIENT NAVIGATOR PROGRESS NOTE  Name: Brooke [name] Date: 07/10/2024 MRN: [id number]  DOB: 1951-04-03   Reason for visit:  Hospital Follow-up with Dr. Lanny  Comments:   Met with patient during her appointment today with Dr. Lanny.  Plan is for surveillance. SDOH completed and no urgent needs were noted.    Patient is established with a treatment plan and is actively engaged in care. Nurse Navigator services not currently indicated at this time. Will re-evaluate if needs change or if additional support is requested.    Time spent counseling/coordinating care: 30-45 minutes

## 2024-07-10 NOTE — Telephone Encounter (Signed)
 Critical lab value reported: K+ 2.4  Notified Dr Lanny (pt take K+ at home per medication list)

## 2024-07-10 NOTE — ED Provider Notes (Signed)
 " [place] EMERGENCY DEPARTMENT AT [facility] Provider Note   CSN: [id number] Arrival date & time: 07/10/24  1300     Patient presents with: Abnormal Lab   Brooke [name] is a 73 y.o. female.   73 year old female presents with low potassium.  Had blood work during cancer center which showed K to be 2.4.  She is on Lasix  and has been compliant with this.  No changes to her regimen.  Notes a slight weakness but has not had any vomiting or diarrhea.  Has history of hypokalemia in the past.  She is on potassium daily       Prior to Admission medications  Medication Sig Start Date End Date Taking? Authorizing Provider  albuterol  (VENTOLIN  HFA) 108 (90 Base) MCG/ACT inhaler Inhale 2 puffs into the lungs every 4 (four) hours as needed for wheezing or shortness of breath. 03/05/24   Jeneal Danita Macintosh, MD  allopurinol  (ZYLOPRIM ) 100 MG tablet TAKE 1 TABLET BY MOUTH EVERY DAY 05/06/24   Norleen Lynwood ORN, MD  atropine  1 % ophthalmic solution Place 1 drop into the left eye daily. 01/16/24   [provider]  brimonidine  (ALPHAGAN ) 0.2 % ophthalmic solution 3 (three) times daily. 1 drop in the left eye three times daily    [provider]  citalopram  (CELEXA ) 40 MG tablet TAKE 1 TABLET BY MOUTH EVERY DAY 03/06/24   Norleen Lynwood ORN, MD  clonazePAM  (KLONOPIN ) 0.5 MG tablet Take 1 tablet (0.5 mg total) by mouth daily as needed (vertigo). 02/25/23   Norleen Lynwood ORN, MD  colchicine  0.6 MG tablet TAKE 0.5 TABLETS (0.3 MG TOTAL) BY MOUTH DAILY AS NEEDED (GOUT OR PSUEDOGOUT PAIN). Patient taking differently: Take 0.3 mg by mouth daily as needed (gout pain). 10/01/22   Joane Artist RAMAN, MD  diltiazem  (CARDIZEM  CD) 180 MG 24 hr capsule Take 1 capsule (180 mg total) by mouth daily. 06/01/24 08/30/24  Shlomo Wilbert SAUNDERS, MD  empagliflozin  (JARDIANCE ) 25 MG TABS tablet Take 25 mg by mouth daily.    [provider]  erythromycin  ophthalmic ointment Place 1 Application into the left eye  at bedtime. 05/29/24   [provider]  famotidine  (PEPCID ) 20 MG tablet Take 1 tablet (20 mg total) by mouth 2 (two) times daily. 03/05/24   Jeneal Danita Macintosh, MD  Fluticasone -Umeclidin-Vilant (TRELEGY ELLIPTA ) 200-62.5-25 MCG/ACT AEPB Inhale 1 puff into the lungs daily. 03/05/24   Jeneal Danita Macintosh, MD  furosemide  (LASIX ) 20 MG tablet Take 1 tablet (20 mg total) by mouth daily. 07/01/24   Norleen Lynwood ORN, MD  hydrochlorothiazide  (MICROZIDE ) 12.5 MG capsule  03/15/24   [provider]  latanoprost  (XALATAN ) 0.005 % ophthalmic solution Place 1 drop into the left eye at bedtime.    [provider]  levocetirizine (XYZAL ) 5 MG tablet Take 1 tablet (5 mg total) by mouth daily. 03/05/24   Jeneal Danita Macintosh, MD  meclizine  (ANTIVERT ) 25 MG tablet TAKE 1 TABLET BY MOUTH 3 TIMES A DAY AS NEEDED FOR DIZZINESS 11/04/23   Norleen Lynwood ORN, MD  metoprolol  succinate (TOPROL -XL) 100 MG 24 hr tablet Take 1 tablet (100 mg total) by mouth daily. Take with or immediately following a meal. Patient taking differently: Take 100 mg by mouth in the morning and at bedtime. 06/22/24   Akula, Vijaya, MD  ondansetron  (ZOFRAN -ODT) 4 MG disintegrating tablet Take 1 tablet (4 mg total) by mouth every 8 (eight) hours as needed for nausea or vomiting. 06/29/24   Odell  Celinda Balo, MD  pantoprazole  (PROTONIX ) 40 MG tablet Take 1 tablet (40 mg total) by mouth daily. 06/22/24   Akula, Vijaya, MD  potassium chloride  (KLOR-CON  10) 10 MEQ tablet Take 1 tablet (10 mEq total) by mouth daily for 5 days. 07/01/24 07/10/24  Norleen Lynwood ORN, MD  rosuvastatin  (CRESTOR ) 40 MG tablet TAKE 1 TABLET BY MOUTH EVERY DAY 04/22/24   Shlomo Wilbert SAUNDERS, MD  sodium bicarbonate  650 MG tablet Take 2 tablets (1,300 mg total) by mouth 2 (two) times daily. 05/17/24   Sebastian Toribio GAILS, MD  traZODone  (DESYREL ) 100 MG tablet TAKE 1 TABLET (100 MG TOTAL) BY MOUTH AT BEDTIME AS NEEDED. FOR SLEEP 03/03/24   Norleen Lynwood ORN, MD     Allergies: Gnp glp-1 daily support [germanium], Metformin  and related, Ace inhibitors, Codeine, Hydrocodone , Tramadol , and Tylenol  [acetaminophen ]    Review of Systems  All other systems reviewed and are negative.   Updated Vital Signs BP (!) 169/80   Pulse 66   Temp 98.2 F (36.8 C) (Oral)   Resp 19   Ht 1.6 m (5' 3)   Wt 80.7 kg   SpO2 100%   BMI 31.53 kg/m   Physical Exam Vitals and nursing note reviewed.  Constitutional:      General: She is not in acute distress.    Appearance: Normal appearance. She is well-developed. She is not toxic-appearing.  HENT:     Head: Normocephalic and atraumatic.  Eyes:     General: Lids are normal.     Conjunctiva/sclera: Conjunctivae normal.     Pupils: Pupils are equal, round, and reactive to light.  Neck:     Thyroid : No thyroid  mass.     Trachea: No tracheal deviation.  Cardiovascular:     Rate and Rhythm: Normal rate and regular rhythm.     Heart sounds: Normal heart sounds. No murmur heard.    No gallop.  Pulmonary:     Effort: Pulmonary effort is normal. No respiratory distress.     Breath sounds: Normal breath sounds. No stridor. No decreased breath sounds, wheezing, rhonchi or rales.  Abdominal:     General: There is no distension.     Palpations: Abdomen is soft.     Tenderness: There is no abdominal tenderness. There is no rebound.  Musculoskeletal:        General: No tenderness. Normal range of motion.     Cervical back: Normal range of motion and neck supple.  Skin:    General: Skin is warm and dry.     Findings: No abrasion or rash.  Neurological:     Mental Status: She is alert and oriented to person, place, and time. Mental status is at baseline.     GCS: GCS eye subscore is 4. GCS verbal subscore is 5. GCS motor subscore is 6.     Cranial Nerves: No cranial nerve deficit.     Sensory: No sensory deficit.     Motor: Motor function is intact.  Psychiatric:        Attention and Perception: Attention  normal.        Speech: Speech normal.        Behavior: Behavior normal.     (all labs ordered are listed, but only abnormal results are displayed) Labs Reviewed  MAGNESIUM     EKG: None  Radiology: No results found.   Procedures   Medications Ordered in the ED  lactated ringers  infusion (has no administration in time range)  potassium chloride   SA (KLOR-CON  M) CR tablet 60 mEq (has no administration in time range)  potassium chloride  10 mEq in 100 mL IVPB (has no administration in time range)                                    Medical Decision Making Amount and/or Complexity of Data Reviewed Labs: ordered. ECG/medicine tests: ordered.  Risk Prescription drug management.   Patient given oral as well as IV potassium.  Have added magnesium  level.  Will require admission     Final diagnoses:  None    ED Discharge Orders     None          Dasie Faden, MD 07/10/24 1417  "

## 2024-07-10 NOTE — H&P (Signed)
 " History and Physical    Brooke [name] FMW:[id number] DOB: [birth year] DOA: 07/10/2024  PCP: Norleen Lynwood ORN, MD   Patient coming from: Home  I have personally briefly reviewed patient's old medical records in [facility]  Chief Complaint: Abnormal lab  HPI: Brooke [name] is a 73 y.o. female with medical history significant of asthma, chronic kidney disease stage IIIa, essential hypertension, depression, anxiety, recent diagnosis of adenocarcinoma of right colon status post right colectomy on June 12, 2024 with recent diagnosis of intra-abdominal abscess requiring admission from 06/25/2024-06/29/2024 requiring IV followed by oral antibiotics and drain placement by IR and subsequently discharged home with indwelling drain presented to cancer center for blood work which showed potassium of 2.4.  She was referred to the ED.  She is on Lasix  and hydrochlorothiazide  at home and takes potassium supplement as well.  Patient complains of slight weakness but denies any vomiting, diarrhea, abdominal pain, fever, chest pain, shortness of breath, loss of consciousness, seizures.  ED Course: She was given IV and oral potassium supplementation and started on IV fluids.  IR did drain injection today which showed complex hematoma but no bowel fistula on drain injection: IR recommended to continue daily drain flushing and will need surgery input prior to drain removal. Hospitalist service was called to evaluate the patient.  Review of Systems: As per HPI otherwise all other systems were reviewed and are negative.   Past Medical History:  Diagnosis Date   Alopecia    Anxiety    Aortic atherosclerosis    Asthma    FOLLOWED BY PCP   Cancer of ascending colon pT3pN0 s/p right colectomy 06/12/2024 06/19/2024   Chronic kidney disease, stage 3a (HCC) 06/25/2024   Eczema    GERD (gastroesophageal reflux disease)    Gout    04-28-2018--- per pt stable , as been a while since last episode   Heart murmur     History of colon polyps    History of syncope 2015   Hyperlipidemia    Hypertension    Hypothyroidism    OA (osteoarthritis) of knee    bilateral   PAT (paroxysmal atrial tachycardia)    Occurred in the setting of ileus and electrolyte abnormalities   PVC's (premature ventricular contractions)    Toxic multinodular goiter    03/ 2003  s/p RAI   Type 2 diabetes mellitus (HCC)    followed by pcp   Ventricular tachycardia, polymorphic (HCC) 01/04/2014   primary cardiologist-- dr wilbert turner (hx monitor 2015 showed couplet PVCs, as trigger)   Wears glasses    Wears partial dentures    upper    Past Surgical History:  Procedure Laterality Date   ABDOMINAL HYSTERECTOMY  10/22/1999   WITH BSO   CATARACT EXTRACTION W/ INTRAOCULAR LENS IMPLANT Left YRS AGO   COLONOSCOPY     COLONOSCOPY N/A 06/11/2024   Procedure: COLONOSCOPY;  Surgeon: Stacia Glendia FORBES, MD;  Location: [facility];  Service: Gastroenterology;  Laterality: N/A;   EXCISION ABDOMINAL WALL MASS  12-20-2005   dr merrilyn @[facility]    neruofibroma   LAPAROSCOPIC CHOLECYSTECTOMY  10/01/2002   dr merrilyn @[facility]    LAPAROSCOPIC PARTIAL COLECTOMY N/A 06/12/2024   Procedure: LAPAROSCOPIC ASSISTED RIGHT COLECTOMY;  Surgeon: Tanda Locus, MD;  Location: [facility];  Service: General;  Laterality: N/A;  LAPAROSCOPIC ASSISTED RIGHT COLECTOMY WITH EXTENDED RESECTION OF DISTAL ILEUM   LEFT HEART CATHETERIZATION WITH CORONARY ANGIOGRAM N/A 01/07/2014   Procedure: LEFT HEART CATHETERIZATION WITH CORONARY ANGIOGRAM;  Surgeon: Peter M Jordan, MD;  Location: [facility] CATH LAB;  Service: Cardiovascular;  Laterality: N/A;   RASTELLI PROCEDURE  6/98 neg   RENAL ARTERY STENT Left 11/2003   angioplasty and stenting   RENAL ARTERY STENT Left 02/2005   re-stenting   TOTAL KNEE ARTHROPLASTY Right 05/05/2018   Procedure: RIGHT TOTAL KNEE ARTHROPLASTY;  Surgeon: Liam Lerner, MD;  Location: [facility];  Service: Orthopedics;  Laterality: Right;   TOTAL KNEE  ARTHROPLASTY Left 09/30/2018   Procedure: TOTAL KNEE ARTHROPLASTY;  Surgeon: Sheril Coy, MD;  Location: [facility];  Service: Orthopedics;  Laterality: Left;     reports that she has never smoked. She has never been exposed to tobacco smoke. She has never used smokeless tobacco. She reports that she does not drink alcohol and does not use drugs.  Allergies[1]  Family History  Problem Relation Age of Onset   Diabetes Mother    Heart disease Father    Colon cancer Neg Hx    Esophageal cancer Neg Hx    Rectal cancer Neg Hx    Stomach cancer Neg Hx    BRCA 1/2 Neg Hx    Breast cancer Neg Hx     Prior to Admission medications  Medication Sig Start Date End Date Taking? Authorizing Provider  albuterol  (VENTOLIN  HFA) 108 (90 Base) MCG/ACT inhaler Inhale 2 puffs into the lungs every 4 (four) hours as needed for wheezing or shortness of breath. 03/05/24   Jeneal Danita Macintosh, MD  allopurinol  (ZYLOPRIM ) 100 MG tablet TAKE 1 TABLET BY MOUTH EVERY DAY 05/06/24   Norleen Lynwood ORN, MD  atropine  1 % ophthalmic solution Place 1 drop into the left eye daily. 01/16/24   [provider]  brimonidine  (ALPHAGAN ) 0.2 % ophthalmic solution 3 (three) times daily. 1 drop in the left eye three times daily    [provider]  citalopram  (CELEXA ) 40 MG tablet TAKE 1 TABLET BY MOUTH EVERY DAY 03/06/24   Norleen Lynwood ORN, MD  clonazePAM  (KLONOPIN ) 0.5 MG tablet Take 1 tablet (0.5 mg total) by mouth daily as needed (vertigo). 02/25/23   Norleen Lynwood ORN, MD  colchicine  0.6 MG tablet TAKE 0.5 TABLETS (0.3 MG TOTAL) BY MOUTH DAILY AS NEEDED (GOUT OR PSUEDOGOUT PAIN). Patient taking differently: Take 0.3 mg by mouth daily as needed (gout pain). 10/01/22   Joane Artist RAMAN, MD  diltiazem  (CARDIZEM  CD) 180 MG 24 hr capsule Take 1 capsule (180 mg total) by mouth daily. 06/01/24 08/30/24  Shlomo Wilbert SAUNDERS, MD  empagliflozin  (JARDIANCE ) 25 MG TABS tablet Take 25 mg by mouth daily.    [provider]   erythromycin  ophthalmic ointment Place 1 Application into the left eye at bedtime. 05/29/24   [provider]  famotidine  (PEPCID ) 20 MG tablet Take 1 tablet (20 mg total) by mouth 2 (two) times daily. 03/05/24   Jeneal Danita Macintosh, MD  Fluticasone -Umeclidin-Vilant (TRELEGY ELLIPTA ) 200-62.5-25 MCG/ACT AEPB Inhale 1 puff into the lungs daily. 03/05/24   Jeneal Danita Macintosh, MD  furosemide  (LASIX ) 20 MG tablet Take 1 tablet (20 mg total) by mouth daily. 07/01/24   Norleen Lynwood ORN, MD  hydrochlorothiazide  (MICROZIDE ) 12.5 MG capsule  03/15/24   [provider]  latanoprost  (XALATAN ) 0.005 % ophthalmic solution Place 1 drop into the left eye at bedtime.    [provider]  levocetirizine (XYZAL ) 5 MG tablet Take 1 tablet (5 mg total) by mouth daily. 03/05/24   Jeneal Danita Macintosh, MD  meclizine  (ANTIVERT ) 25 MG  tablet TAKE 1 TABLET BY MOUTH 3 TIMES A DAY AS NEEDED FOR DIZZINESS 11/04/23   Norleen Lynwood ORN, MD  metoprolol  succinate (TOPROL -XL) 100 MG 24 hr tablet Take 1 tablet (100 mg total) by mouth daily. Take with or immediately following a meal. Patient taking differently: Take 100 mg by mouth in the morning and at bedtime. 06/22/24   Akula, Vijaya, MD  ondansetron  (ZOFRAN -ODT) 4 MG disintegrating tablet Take 1 tablet (4 mg total) by mouth every 8 (eight) hours as needed for nausea or vomiting. 06/29/24   Odell Celinda Balo, MD  pantoprazole  (PROTONIX ) 40 MG tablet Take 1 tablet (40 mg total) by mouth daily. 06/22/24   Akula, Vijaya, MD  potassium chloride  (KLOR-CON  10) 10 MEQ tablet Take 1 tablet (10 mEq total) by mouth daily for 5 days. 07/01/24 07/10/24  Norleen Lynwood ORN, MD  rosuvastatin  (CRESTOR ) 40 MG tablet TAKE 1 TABLET BY MOUTH EVERY DAY 04/22/24   Shlomo Wilbert SAUNDERS, MD  sodium bicarbonate  650 MG tablet Take 2 tablets (1,300 mg total) by mouth 2 (two) times daily. 05/17/24   Sebastian Toribio GAILS, MD  traZODone  (DESYREL ) 100 MG tablet TAKE 1 TABLET (100 MG TOTAL) BY  MOUTH AT BEDTIME AS NEEDED. FOR SLEEP 03/03/24   Norleen Lynwood ORN, MD    Physical Exam: Vitals:   07/10/24 1306  BP: (!) 169/80  Pulse: 66  Resp: 19  Temp: 98.2 F (36.8 C)  TempSrc: Oral  SpO2: 100%  Weight: 80.7 kg  Height: 5' 3 (1.6 m)    Constitutional: NAD, calm, comfortable Vitals:   07/10/24 1306  BP: (!) 169/80  Pulse: 66  Resp: 19  Temp: 98.2 F (36.8 C)  TempSrc: Oral  SpO2: 100%  Weight: 80.7 kg  Height: 5' 3 (1.6 m)   Eyes: PERRL, lids and conjunctivae normal ENMT: Mucous membranes are moist. Posterior pharynx clear of any exudate or lesions. Neck: normal, supple, no masses, no thyromegaly Respiratory: bilateral decreased breath sounds at bases, no wheezing, no crackles. Normal respiratory effort. No accessory muscle use.  Cardiovascular: S1 S2 positive, rate controlled. No extremity edema. 2+ pedal pulses.  Abdomen: no tenderness, no masses palpated. No hepatosplenomegaly. Bowel sounds positive. Drain with dressing present Musculoskeletal: no clubbing / cyanosis. No joint deformity upper and lower extremities.  Skin: no rashes, lesions, ulcers. No induration Neurologic: CN 2-12 grossly intact. Moving extremities. No focal neurologic deficits.  Psychiatric: Normal judgment and insight. Alert and oriented x 3. Normal mood.    Labs on Admission: I have personally reviewed following labs and imaging studies  CBC: Recent Labs  Lab 07/10/24 0858  WBC 9.0  NEUTROABS 5.9  HGB 10.5*  HCT 31.6*  MCV 81.2  PLT 363   Basic Metabolic Panel: Recent Labs  Lab 07/10/24 0858  NA 145  K 2.4*  CL 108  CO2 23  GLUCOSE 89  BUN <5*  CREATININE 1.25*  CALCIUM  8.2*   GFR: Estimated Creatinine Clearance: 40.9 mL/min (A) (by C-G formula based on SCr of 1.25 mg/dL (H)). Liver Function Tests: Recent Labs  Lab 07/10/24 0858  AST 23  ALT 13  ALKPHOS 238*  BILITOT 0.3  PROT 5.9*  ALBUMIN  2.6*   No results for input(s): LIPASE, AMYLASE in the last 168  hours. No results for input(s): AMMONIA in the last 168 hours. Coagulation Profile: No results for input(s): INR, PROTIME in the last 168 hours. Cardiac Enzymes: No results for input(s): CKTOTAL, CKMB, CKMBINDEX, TROPONINI in the last 168 hours. BNP (last  3 results) No results for input(s): PROBNP in the last 8760 hours. HbA1C: No results for input(s): HGBA1C in the last 72 hours. CBG: No results for input(s): GLUCAP in the last 168 hours. Lipid Profile: No results for input(s): CHOL, HDL, LDLCALC, TRIG, CHOLHDL, LDLDIRECT in the last 72 hours. Thyroid  Function Tests: No results for input(s): TSH, T4TOTAL, FREET4, T3FREE, THYROIDAB in the last 72 hours. Anemia Panel: No results for input(s): VITAMINB12, FOLATE, FERRITIN, TIBC, IRON, RETICCTPCT in the last 72 hours. Urine analysis:    Component Value Date/Time   COLORURINE YELLOW 06/08/2024 0035   APPEARANCEUR HAZY (A) 06/08/2024 0035   LABSPEC 1.025 06/08/2024 0035   PHURINE 6.0 06/08/2024 0035   GLUCOSEU 150 (A) 06/08/2024 0035   GLUCOSEU 500 (A) 09/06/2022 1011   HGBUR SMALL (A) 06/08/2024 0035   BILIRUBINUR NEGATIVE 06/08/2024 0035   BILIRUBINUR neg 09/12/2016 0954   KETONESUR NEGATIVE 06/08/2024 0035   PROTEINUR 100 (A) 06/08/2024 0035   UROBILINOGEN 0.2 09/06/2022 1011   NITRITE NEGATIVE 06/08/2024 0035   LEUKOCYTESUR NEGATIVE 06/08/2024 0035    Radiological Exams on Admission: No results found.   Assessment/Plan  Hypokalemia - Possibly from diuretic use.  Potassium 2.4 this morning.  Check magnesium .  Continue potassium replacement and repeat labs in AM.  Telemetry monitoring.  Hold diuretics including hydrochlorothiazide  and Lasix   Recent intra-abdominal abscess recent diagnosis of adenocarcinoma of right colon status post right colectomy on June 12, 2024  -recent diagnosis of intra-abdominal abscess requiring admission from 06/25/2024-06/29/2024 requiring  IV followed by oral antibiotics and drain placement by IR and subsequently discharged home with indwelling drain  - IR did drain injection today which showed complex hematoma but no bowel fistula on drain injection: IR recommended to continue daily drain flushing and will need surgery input prior to drain removal. - Outpatient follow-up with oncology  Chronic kidney disease stage IIIa - Creatinine stable.  Monitor  Asthma - Stable.  Resume home regimen once verified  Hyperlipidemia - Resume home regimen once verified  Depression/anxiety - Resume home regimen once verified  Hypertension - Monitor blood pressure.  Hold Lasix  and hydrochlorothiazide   Obesity class I - Outpatient follow-up  Anemia of chronic disease - From chronic illnesses.  Hemoglobin stable.  Monitor intermittently  DVT prophylaxis: SCDs Code Status: Full Family Communication: None at bedside Disposition Plan: Home in 1 to 2 days once clinically improved Consults called: Will consult general surgery Admission status: Observation/telemetry  Severity of Illness: The appropriate patient status for this patient is OBSERVATION. Observation status is judged to be reasonable and necessary in order to provide the required intensity of service to ensure the patient's safety. The patient's presenting symptoms, physical exam findings, and initial radiographic and laboratory data in the context of their medical condition is felt to place them at decreased risk for further clinical deterioration. Furthermore, it is anticipated that the patient will be medically stable for discharge from the hospital within 2 midnights of admission.     Sophie Mao MD [facility]  07/10/2024, 2:48 PM        [1]  Allergies Allergen Reactions   Gnp Glp-1 Daily Support [Germanium] Other (See Comments)    Ileus requiring hospitalization   Metformin  And Related Nausea And Vomiting   Ace Inhibitors Swelling and Other (See  Comments)    Ankles swell   Codeine Nausea And Vomiting and Rash   Hydrocodone  Nausea And Vomiting   Tramadol  Nausea And Vomiting   Tylenol  [Acetaminophen ] Itching, Rash and Other (See  Comments)    Allergic to prescription strength Tylenol . The patient is taking 500 mg Tylenol  in 2025, however.   "

## 2024-07-10 NOTE — Procedures (Signed)
 Interventional Radiology Procedure:   Indications: Post op hematoma.  Hematoma has decreased in size based on today's CT  Procedure: Drain injection  Findings: US  demonstrates a complex hematoma.  No bowel fistula on drain injection.    Complications: None     EBL: None  Plan:  Continue daily drain flushing and will need Surgery input prior to drain removal.     Morrison Mcbryar R. Philip, MD  Pager: [phone]

## 2024-07-10 NOTE — ED Triage Notes (Addendum)
 Patient sent from cancer center. Was told her potassium was low at 2.4. Said she feels normal.

## 2024-07-10 NOTE — Plan of Care (Incomplete)
  Problem: Education: Goal: Knowledge of General Education information will improve Description: Including pain rating scale, medication(s)/side effects and non-pharmacologic comfort measures Outcome: Not Progressing   Problem: Health Behavior/Discharge Planning: Goal: Ability to manage health-related needs will improve Outcome: Not Progressing   Problem: Clinical Measurements: Goal: Ability to maintain clinical measurements within normal limits will improve Outcome: Not Progressing Goal: Will remain free from infection Outcome: Not Progressing Goal: Diagnostic test results will improve Outcome: Not Progressing

## 2024-07-11 DIAGNOSIS — E876 Hypokalemia: Secondary | ICD-10-CM | POA: Diagnosis not present

## 2024-07-11 LAB — CBC
HCT: 26 % — ABNORMAL LOW (ref 36.0–46.0)
Hemoglobin: 8.6 g/dL — ABNORMAL LOW (ref 12.0–15.0)
MCH: 27.5 pg (ref 26.0–34.0)
MCHC: 33.1 g/dL (ref 30.0–36.0)
MCV: 83.1 fL (ref 80.0–100.0)
Platelets: 284 K/uL (ref 150–400)
RBC: 3.13 MIL/uL — ABNORMAL LOW (ref 3.87–5.11)
RDW: 16.7 % — ABNORMAL HIGH (ref 11.5–15.5)
WBC: 8.6 K/uL (ref 4.0–10.5)
nRBC: 0 % (ref 0.0–0.2)

## 2024-07-11 LAB — COMPREHENSIVE METABOLIC PANEL WITH GFR
ALT: 12 U/L (ref 0–44)
AST: 21 U/L (ref 15–41)
Albumin: 2.2 g/dL — ABNORMAL LOW (ref 3.5–5.0)
Alkaline Phosphatase: 197 U/L — ABNORMAL HIGH (ref 38–126)
Anion gap: 10 (ref 5–15)
BUN: <5 mg/dL — ABNORMAL LOW (ref 8–23)
CO2: 23 mmol/L (ref 22–32)
Calcium: 8 mg/dL — ABNORMAL LOW (ref 8.9–10.3)
Chloride: 113 mmol/L — ABNORMAL HIGH (ref 98–111)
Creatinine, Ser: 1.02 mg/dL — ABNORMAL HIGH (ref 0.44–1.00)
GFR, Estimated: 58 mL/min — ABNORMAL LOW
Glucose, Bld: 71 mg/dL (ref 70–99)
Potassium: 3.2 mmol/L — ABNORMAL LOW (ref 3.5–5.1)
Sodium: 146 mmol/L — ABNORMAL HIGH (ref 135–145)
Total Bilirubin: <0.2 mg/dL (ref 0.0–1.2)
Total Protein: 4.7 g/dL — ABNORMAL LOW (ref 6.5–8.1)

## 2024-07-11 LAB — MAGNESIUM: Magnesium: 1.9 mg/dL (ref 1.7–2.4)

## 2024-07-11 MED ORDER — POTASSIUM CHLORIDE CRYS ER 20 MEQ PO TBCR: 20.0000 meq | EXTENDED_RELEASE_TABLET | Freq: Two times a day (BID) | ORAL | 0 refills | Status: DC

## 2024-07-11 MED ORDER — POTASSIUM CHLORIDE CRYS ER 20 MEQ PO TBCR
40.0000 meq | EXTENDED_RELEASE_TABLET | ORAL | Status: DC
  Administered 2024-07-11: 40 meq via ORAL
  Filled 2024-07-11: qty 2

## 2024-07-11 NOTE — Discharge Summary (Signed)
 Physician Discharge Summary  Brooke [name] FMW:[id number] DOB: Dec 04, 1950 DOA: 07/10/2024  PCP: Norleen Lynwood ORN, MD  Admit date: 07/10/2024 Discharge date: 07/11/2024  Admitted From: Home Disposition: Home  Recommendations for Outpatient Follow-up:  Follow up with PCP in 1 week with repeat CBC/BMP Follow up in ED if symptoms worsen or new appear   Home Health: No Equipment/Devices: None  Discharge Condition: Stable CODE STATUS: Full Diet recommendation: Heart healthy  Brief/Interim Summary: 73 y.o. female with medical history significant of asthma, chronic kidney disease stage IIIa, essential hypertension, depression, anxiety, recent diagnosis of adenocarcinoma of right colon status post right colectomy on June 12, 2024 with recent diagnosis of intra-abdominal abscess requiring admission from 06/25/2024-06/29/2024 requiring IV followed by oral antibiotics and drain placement by IR and subsequently discharged home with indwelling drain presented to cancer center for blood work which showed potassium of 2.4.  She was referred to the ED. magnesium  was 1.4 which was replaced.  General surgery recommended outpatient follow-up with general surgery.  Subsequently, potassium is 3.2 today which is being replaced.  She feels better and feels okay to go home today.  She will be discharged home today with supplemental potassium.  Outpatient follow-up with PCP  Discharge Diagnoses:   Hypokalemia - Possibly from diuretic use.  Potassium 2.4 on presentation.  Treated with potassium replacement oral and IV.  Subsequently, potassium is 3.2 today which is being replaced.  She feels better and feels okay to go home today.  She will be discharged home today with supplemental potassium.  Outpatient follow-up with PCP -diuretics including hydrochlorothiazide  and Lasix  will remain on hold till reevaluation with PCP.   Recent intra-abdominal abscess recent diagnosis of adenocarcinoma of right colon status  post right colectomy on June 12, 2024  -recent diagnosis of intra-abdominal abscess requiring admission from 06/25/2024-06/29/2024 requiring IV followed by oral antibiotics and drain placement by IR and subsequently discharged home with indwelling drain  - IR did drain injection today which showed complex hematoma but no bowel fistula on drain injection: IR recommended to continue daily drain flushing and will need surgery input prior to drain removal. General surgery recommended outpatient follow-up with general surgery. - Outpatient follow-up with oncology   Chronic kidney disease stage IIIa - Creatinine stable.  Monitor intermittently as an outpatient   Asthma - Stable.  Resume home regimen    Hyperlipidemia - Resume home regimen    Depression/anxiety - Resume Celexa   Hypertension - Hold Lasix  and hydrochlorothiazide .  Resume rest of the home antihypertensive regimen   Obesity class I - Outpatient follow-up   Anemia of chronic disease - From chronic illnesses.  Hemoglobin stable.  Monitor intermittently as an outpatient  Hypernatremia - Mild.  Outpatient follow-up  Hypomagnesemia -Replaced.  Resolved   Discharge Instructions  Discharge Instructions     Diet general   Complete by: As directed    Increase activity slowly   Complete by: As directed    No wound care   Complete by: As directed       Allergies as of 07/11/2024       Reactions   Gnp Glp-1 Daily Support [germanium] Other (See Comments)   Ileus requiring hospitalization   Metformin  And Related Nausea And Vomiting   Ace Inhibitors Swelling, Other (See Comments)   Ankles swell   Codeine Nausea And Vomiting, Rash   Hydrocodone  Nausea And Vomiting   Tramadol  Nausea And Vomiting   Tylenol  [acetaminophen ] Itching, Rash, Other (See Comments)   Allergic to prescription  strength Tylenol . The patient is taking 500 mg Tylenol  in 2025, however.        Medication List     STOP taking these  medications    clonazePAM  0.5 MG tablet Commonly known as: KLONOPIN    furosemide  20 MG tablet Commonly known as: Lasix    hydrochlorothiazide  12.5 MG capsule Commonly known as: MICROZIDE    potassium chloride  10 MEQ tablet Commonly known as: Klor-Con  10   sodium bicarbonate  650 MG tablet       TAKE these medications    albuterol  108 (90 Base) MCG/ACT inhaler Commonly known as: VENTOLIN  HFA Inhale 2 puffs into the lungs every 4 (four) hours as needed for wheezing or shortness of breath.   allopurinol  100 MG tablet Commonly known as: ZYLOPRIM  TAKE 1 TABLET BY MOUTH EVERY DAY   atropine  1 % ophthalmic solution Place 1 drop into the left eye daily.   brimonidine  0.2 % ophthalmic solution Commonly known as: ALPHAGAN  3 (three) times daily. 1 drop in the left eye three times daily   citalopram  40 MG tablet Commonly known as: CELEXA  TAKE 1 TABLET BY MOUTH EVERY DAY   colchicine  0.6 MG tablet TAKE 0.5 TABLETS (0.3 MG TOTAL) BY MOUTH DAILY AS NEEDED (GOUT OR PSUEDOGOUT PAIN). What changed: reasons to take this   diltiazem  180 MG 24 hr capsule Commonly known as: CARDIZEM  CD Take 1 capsule (180 mg total) by mouth daily.   erythromycin  ophthalmic ointment Place 1 Application into the left eye at bedtime.   famotidine  20 MG tablet Commonly known as: Pepcid  Take 1 tablet (20 mg total) by mouth 2 (two) times daily.   Jardiance  25 MG Tabs tablet Generic drug: empagliflozin  Take 25 mg by mouth daily.   latanoprost  0.005 % ophthalmic solution Commonly known as: XALATAN  Place 1 drop into the left eye at bedtime.   levocetirizine 5 MG tablet Commonly known as: XYZAL  Take 1 tablet (5 mg total) by mouth daily.   meclizine  25 MG tablet Commonly known as: ANTIVERT  TAKE 1 TABLET BY MOUTH 3 TIMES A DAY AS NEEDED FOR DIZZINESS   metoprolol  succinate 100 MG 24 hr tablet Commonly known as: TOPROL -XL Take 1 tablet (100 mg total) by mouth daily. Take with or immediately  following a meal. What changed:  when to take this additional instructions   ondansetron  4 MG disintegrating tablet Commonly known as: ZOFRAN -ODT Take 1 tablet (4 mg total) by mouth every 8 (eight) hours as needed for nausea or vomiting.   pantoprazole  40 MG tablet Commonly known as: PROTONIX  Take 1 tablet (40 mg total) by mouth daily.   potassium chloride  SA 20 MEQ tablet Commonly known as: KLOR-CON  M Take 1 tablet (20 mEq total) by mouth 2 (two) times daily. Start taking on: July 12, 2024   rosuvastatin  40 MG tablet Commonly known as: CRESTOR  TAKE 1 TABLET BY MOUTH EVERY DAY   traZODone  100 MG tablet Commonly known as: DESYREL  TAKE 1 TABLET (100 MG TOTAL) BY MOUTH AT BEDTIME AS NEEDED. FOR SLEEP   Trelegy Ellipta  200-62.5-25 MCG/ACT Aepb Generic drug: Fluticasone -Umeclidin-Vilant Inhale 1 puff into the lungs daily.        Follow-up Information     Norleen Lynwood ORN, MD. Schedule an appointment as soon as possible for a visit in 1 week(s).   Specialties: Internal Medicine, Radiology Why: with repeat bmp/mag Contact information: [address] [place] [address] [phone]                Allergies[1]  Consultations: None   Procedures/Studies: IR INJECT INDWELLING DRAINAGE CATHETER Result Date: 07/10/2024 INDICATION: 73 year old with colon cancer and status post partial colectomy. Patient developed a postoperative hematoma. Drain was placed on 06/26/2024. Patient reports a small amount of bloody output each day. Today's CT demonstrates that the postoperative hematoma has decreased in size. EXAM: DRAIN INJECTION WITH FLUOROSCOPY MEDICATIONS: None ANESTHESIA/SEDATION: None FLUOROSCOPY TIME:  Radiation Exposure Index (as provided by the fluoroscopic device): 11 mGy Kerma CONTRAST:  20 mL Omnipaque  300 COMPLICATIONS: None immediate. PROCEDURE: Patient was placed supine on the interventional table. The right upper quadrant was evaluated with ultrasound.  The right upper abdominal collection was noted to be echogenic and heterogeneous. The ultrasound findings are compatible with a complex hematoma. Scout image was obtained. The drain was injected with contrast under fluoroscopy. Contrast filled the irregular hematoma but there was no evidence for a bowel fistula. Contrast was aspirated. Drain was flushed with saline and attached to a suction bulb. Fluoroscopic images were taken and saved for this procedure. FINDINGS: Drain is well positioned within the postoperative hematoma. No evidence for a bowel fistula. IMPRESSION: 1. Percutaneous drain is well positioned within the complex postoperative hematoma. 2. Instructed the patient to continue with routine drain flushing. Patient will be scheduled in drain clinic in approximately 2 weeks. Electronically Signed   By: Juliene Balder M.D.   On: 07/10/2024 16:55   CT ABDOMEN PELVIS W CONTRAST Result Date: 07/10/2024 CLINICAL DATA:  73 year old with colon cancer and status post right hemicolectomy. Patient developed a postoperative hematoma. Percutaneous drain was placed on 06/26/2024. Patient reports a small volume of bloody output from the drain each day. EXAM: CT ABDOMEN AND PELVIS WITH CONTRAST TECHNIQUE: Multidetector CT imaging of the abdomen and pelvis was performed using the standard protocol following bolus administration of intravenous contrast. RADIATION DOSE REDUCTION: This exam was performed according to the departmental dose-optimization program which includes automated exposure control, adjustment of the mA and/or kV according to patient size and/or use of iterative reconstruction technique. CONTRAST:  80mL OMNIPAQUE  IOHEXOL  300 MG/ML  SOLN COMPARISON:  06/25/2024 FINDINGS: Lower chest: Again noted is 3 mm nodular density at the left lung base on image 25, sequence 4 that is indeterminate. Remainder of the lung bases are clear. No pleural effusions. Hepatobiliary: Cholecystectomy. Mild intrahepatic and  extrahepatic biliary dilatation. Intrahepatic biliary dilatation may be slightly increased from the recent comparison examination. Trace perihepatic ascites near the anterior right hemidiaphragm. Main portal venous system is patent. Again noted are scattered hepatic cysts. Pancreas: Fatty replacement of the pancreas but there is a focal nodular structure at the pancreatic tail on image 25, image 2. This area measures up to 8 mm. There was a similar finding on the MRI from 01/07/2022. Not clear if this represents a lesion versus normal pancreatic tissue without fatty replacement. Spleen: Normal in size without focal abnormality. Adrenals/Urinary Tract: Normal adrenal glands. Again noted is a cyst in the left kidney upper pole that does not require dedicated follow-up. Adrenal glands are within normal limits. No hydronephrosis. Small cyst in the left kidney lower pole. Normal appearance of the urinary bladder. Stomach/Bowel: Normal appearance of the stomach. Status post right hemicolectomy. There is no evidence for bowel dilatation or focal bowel inflammation. Diverticula involving the descending colon and sigmoid colon. Vascular/Lymphatic: Atherosclerotic disease in the abdominal aorta without aneurysm. Left renal artery stent. Again noted are small lymph nodes on the right side of the pelvis on image 71/2. Index lymph node measures 0.8 cm in  the short axis. Findings are similar to the prior examination but these lymph nodes are indeterminate. Indeterminate soft tissue in the anterior abdomen on image 32/2 which could be postoperative in etiology but difficult to exclude subtle omental disease and this area needs attention on follow-up. Reproductive: Status post hysterectomy. No adnexal masses. Other: The large right upper abdominal fluid collection has decreased in size since placement of the percutaneous drain. The right anterior percutaneous drain is well positioned within the collection. Collection measures 6.1 x  6.4 x 6.2 cm on image 29/2 and previously measured 8.2 x 6.4 x 7.8 cm at a similar level. Decreased gas within this collection. Persistent inflammatory changes in the right upper quadrant of the abdomen. No new fluid or abscess collections. Musculoskeletal: Again noted are postoperative changes along the anterior abdominal wall with evidence of a healing wound. Persistent gas in the deep portion of the abdominal wound. No acute bone abnormality. IMPRESSION: 1. Decreased size of the right upper abdominal fluid collection following percutaneous drain placement. The drain is well positioned within the collection. Findings are compatible with a slowly resolving postoperative hematoma. 2. Persistent inflammatory changes in the right upper quadrant of the abdomen. No new fluid or abscess collections. 3. Indeterminate soft tissue in the anterior abdomen which could be postoperative in etiology but difficult to exclude subtle omental disease in this area. Recommend attention to this area on follow up. 4. Indeterminate 8 mm nodular structure at the pancreatic tail. This finding has not significantly changed since 2023. There is diffuse fatty replacement in the pancreas and it is possible that this nodular area could represent normal pancreatic tissue but difficult to exclude a small lesion. Recommend attention on follow-up imaging. 5. Indeterminate 3 mm nodule at the left lung base. Recommend attention on follow-up imaging 6. Aortic Atherosclerosis (ICD10-I70.0). Electronically Signed   By: Juliene Balder M.D.   On: 07/10/2024 16:46   LONG TERM MONITOR (3-14 DAYS) Result Date: 07/01/2024   Predominant rhythm was normal sinus rhythm with average HR 78bpm and ranged from 50  to   2 episodes of NSVT lasting as long as 6 beats   7 episodes of SVT with longest run lasting 10.1 seconds   Occasional PACs and rare atrial couplets and triplets   Rare PVCs, ventricular couplets and triplets and ventricular trigeminy Patch Wear Time:   12 days and 6 hours (2025-11-14T03:29:59-0500 to 2025-11-26T10:29:34-0500) Patient had a min HR of 50 bpm, max HR of 214 bpm, and avg HR of 78 bpm. Predominant underlying rhythm was Sinus Rhythm. QRS morphology changes were present throughout recording. 2 Ventricular Tachycardia runs occurred, the run with the fastest interval lasting 5 beats with a max rate of 214 bpm, the longest lasting 6 beats with an avg rate of 138 bpm. 7 Supraventricular Tachycardia runs occurred, the run with the fastest interval lasting 4 beats with a max rate of 162 bpm, the longest lasting 10.1 secs  with an avg rate of 130 bpm. Isolated SVEs were occasional (1.5%, 18335), SVE Couplets were rare (<1.0%, 222), and SVE Triplets were rare (<1.0%, 74). Isolated VEs were rare (<1.0%, 4979), VE Couplets were rare (<1.0%, 119), and VE Triplets were rare (<1.0%, 11). Ventricular Trigeminy was present.   CT GUIDED PERITONEAL/RETROPERITONEAL FLUID DRAIN BY PERC CATH Result Date: 06/26/2024 INDICATION: 201066 Intra-abdominal abscess (HCC) 201066 EXAM: CT-GUIDED RIGHT UPPER QUADRANT ABSCESS DRAINAGE CATHETER PLACEMENT COMPARISON:  CT AP, 06/25/2024. MEDICATIONS: The patient is currently admitted to the hospital and receiving intravenous antibiotics. The  antibiotics were administered within an appropriate time frame prior to the initiation of the procedure. ANESTHESIA/SEDATION: Moderate (conscious) sedation was employed during this procedure. A total of Versed  2 mg and Fentanyl  100 mcg was administered intravenously. Moderate Sedation Time: 26 minutes. The patient's level of consciousness and vital signs were monitored continuously by radiology nursing throughout the procedure under my direct supervision. CONTRAST:  None FLUOROSCOPY TIME:  CT dose; 792 mGycm COMPLICATIONS: None immediate. PROCEDURE: RADIATION DOSE REDUCTION: This exam was performed according to the departmental dose-optimization program which includes automated exposure control,  adjustment of the mA and/or kV according to patient size and/or use of iterative reconstruction technique. Informed written consent was obtained from the patient after a discussion of the risks, benefits and alternatives to treatment. The patient was placed supine on the CT gantry and a pre procedural CT was performed re-demonstrating the known abscess/fluid collection within the RIGHT upper quadrant. The procedure was planned. A timeout was performed prior to the initiation of the procedure. The RIGHT upper quadrant was prepped and draped in the usual sterile fashion. The overlying soft tissues were anesthetized with 1% lidocaine  with epinephrine . Appropriate trajectory was planned with the use of a 22 gauge spinal needle. An 18 gauge trocar needle was advanced into the abscess/fluid collection and a short Amplatz super stiff wire was coiled within the collection. Appropriate positioning was confirmed with a limited CT scan. The tract was serially dilated allowing placement of a 14 Fr drainage catheter. Appropriate positioning was confirmed with a limited postprocedural CT scan. 5 mL of dark sanguinous fluid was aspirated. The tube was connected to a bulb suction and sutured in place. A dressing was placed. The patient tolerated the procedure well without immediate post procedural complication. IMPRESSION: Successful CT-guided placement of a 14 Fr drainage catheter into the RIGHT upper quadrant Aspiration of 5 mL of dark sanguinous fluid, suspicious for hematoma. Samples were sent to the laboratory as requested by the ordering clinical team. RECOMMENDATIONS: The patient will return to [facility] ([facility]) for routine drainage catheter evaluation in 7-10 days. Thom Hall, MD [facility] Electronically Signed   By: Thom Hall M.D.   On: 06/26/2024 17:35   CT ABDOMEN PELVIS W CONTRAST Result Date: 06/25/2024 CLINICAL DATA:  Generalized  abdominal pain. Status post recent laparoscopic partial colectomy. EXAM: CT ABDOMEN AND PELVIS WITH CONTRAST TECHNIQUE: Multidetector CT imaging of the abdomen and pelvis was performed using the standard protocol following bolus administration of intravenous contrast. RADIATION DOSE REDUCTION: This exam was performed according to the departmental dose-optimization program which includes automated exposure control, adjustment of the mA and/or kV according to patient size and/or use of iterative reconstruction technique. CONTRAST:  OMNIPAQUE  IOHEXOL  300 MG/ML  SOLN COMPARISON:  Preoperative CT 06/07/2024 FINDINGS: Lower chest: Trace bilateral pleural effusions, right greater than left. Patulous distal esophagus. Hepatobiliary: Multiple cysts in the liver, stable from prior exam. Stable biliary dilatation post cholecystectomy. Pancreas: Fatty atrophy.  No ductal dilatation or inflammation. Spleen: Heterogeneous enhancement without discrete lesion. Cleft posteriorly. Small amount of perisplenic fluid is again seen, simple in density. Adrenals/Urinary Tract: No adrenal nodule. No hydronephrosis or renal calculi. Low-density lesion arising from the upper left kidney measuring 2.7 cm, unchanged from prior exams. Unremarkable urinary bladder. Stomach/Bowel: Right hemicolectomy. Heterogeneous air-fluid collection in the right upper quadrant posterior to the surgical anastomosis measures 7.5 x 7.1 cm, without internal contrast. This has surrounding edema and soft tissue stranding. Collection abuts the inferior  right lobe of the liver, small bowel loops in the right abdomen as well as lateral aspect of the duodenum. Administered enteric contrast reaches the colon. No bowel obstruction. No small bowel ileus. Colonic diverticulosis without diverticulitis. Vascular/Lymphatic: Aortic atherosclerosis. No aortic aneurysm. The portal vein is patent. Shotty retroperitoneal and central mesenteric nodes are likely reactive in this  setting. Prominent 8 mm short axis lymph nodes in the low pelvis, series 2, image 71, slightly increased in size from prior. Reproductive: Status post hysterectomy. No adnexal masses. Other: Heterogeneous right upper quadrant fluid collection as described. Adjacent stranding and small amount of free fluid extends to the inferior aspect of the liver. Minimal pockets of free fluid in the mesentery without additional organized collection. Small amount of perisplenic fluid was present on prior exam. Postsurgical change of the midline anterior abdominal wall. Small foci of gas in the anterior abdominal wall above the umbilicus likely related to prior laparoscopic port site. There is generalized subcutaneous edema and fat stranding about the anterior abdominal wall, as well as both lateral flanks. No organized subcutaneous collection. Probable sebaceous cyst in the right gluteal soft tissues. Musculoskeletal: There are no acute or suspicious osseous abnormalities. No intramuscular collection. IMPRESSION: 1. Recent right hemicolectomy. Heterogeneous air-fluid collection in the right upper quadrant measures 7.5 x 7.1 cm, consistent with abscess. This has surrounding edema and soft tissue stranding. No internal enteric contrast to suggest operative leak. 2. Trace bilateral pleural effusions, right greater than left. 3. Generalized subcutaneous edema and fat stranding about the anterior abdominal wall, as well as both lateral flanks. No organized subcutaneous collection. 4. Prominent 8 mm short axis lymph nodes in the low pelvis, slightly increased in size from prior exam, likely reactive. Aortic Atherosclerosis (ICD10-I70.0). These results will be called to the ordering clinician or representative by the Radiologist Assistant, and communication documented in the PACS or [employer]. Electronically Signed   By: Andrea Gasman M.D.   On: 06/25/2024 18:29      Subjective: Patient seen and examined at bedside today.   Had some nausea and an episode of vomiting this morning but wants to go home today.  No fever, chest pain or shortness of breath reported.  Discharge Exam: Vitals:   07/11/24 0003 07/11/24 0418  BP: (!) 112/57 135/60  Pulse: 70 77  Resp: 14 14  Temp: 99.1 F (37.3 C) 98.9 F (37.2 C)  SpO2: 99% 98%    General: Pt is alert, awake, not in acute distress.  On room air. Cardiovascular: rate controlled, S1/S2 + Respiratory: bilateral decreased breath sounds at bases Abdominal: Soft, obese, NT, ND, bowel sounds +, abdominal drain with dressing present Extremities: no edema, no cyanosis    The results of significant diagnostics from this hospitalization (including imaging, microbiology, ancillary and laboratory) are listed below for reference.     Microbiology: No results found for this or any previous visit (from the past 240 hours).   Labs: BNP (last 3 results) Recent Labs    02/18/24 1811  BNP 54.3   Basic Metabolic Panel: Recent Labs  Lab 07/10/24 0858 07/10/24 1509 07/11/24 0615  NA 145  --  146*  K 2.4*  --  3.2*  CL 108  --  113*  CO2 23  --  23  GLUCOSE 89  --  71  BUN <5*  --  <5*  CREATININE 1.25*  --  1.02*  CALCIUM  8.2*  --  8.0*  MG  --  1.4* 1.9   Liver  Function Tests: Recent Labs  Lab 07/10/24 0858 07/11/24 0615  AST 23 21  ALT 13 12  ALKPHOS 238* 197*  BILITOT 0.3 <0.2  PROT 5.9* 4.7*  ALBUMIN  2.6* 2.2*   No results for input(s): LIPASE, AMYLASE in the last 168 hours. No results for input(s): AMMONIA in the last 168 hours. CBC: Recent Labs  Lab 07/10/24 0858 07/11/24 0615  WBC 9.0 8.6  NEUTROABS 5.9  --   HGB 10.5* 8.6*  HCT 31.6* 26.0*  MCV 81.2 83.1  PLT 363 284   Cardiac Enzymes: No results for input(s): CKTOTAL, CKMB, CKMBINDEX, TROPONINI in the last 168 hours. BNP: Invalid input(s): POCBNP CBG: No results for input(s): GLUCAP in the last 168 hours. D-Dimer No results for input(s): DDIMER in the last  72 hours. Hgb A1c No results for input(s): HGBA1C in the last 72 hours. Lipid Profile No results for input(s): CHOL, HDL, LDLCALC, TRIG, CHOLHDL, LDLDIRECT in the last 72 hours. Thyroid  function studies No results for input(s): TSH, T4TOTAL, T3FREE, THYROIDAB in the last 72 hours.  Invalid input(s): FREET3 Anemia work up No results for input(s): VITAMINB12, FOLATE, FERRITIN, TIBC, IRON, RETICCTPCT in the last 72 hours. Urinalysis    Component Value Date/Time   COLORURINE YELLOW 06/08/2024 0035   APPEARANCEUR HAZY (A) 06/08/2024 0035   LABSPEC 1.025 06/08/2024 0035   PHURINE 6.0 06/08/2024 0035   GLUCOSEU 150 (A) 06/08/2024 0035   GLUCOSEU 500 (A) 09/06/2022 1011   HGBUR SMALL (A) 06/08/2024 0035   BILIRUBINUR NEGATIVE 06/08/2024 0035   BILIRUBINUR neg 09/12/2016 0954   KETONESUR NEGATIVE 06/08/2024 0035   PROTEINUR 100 (A) 06/08/2024 0035   UROBILINOGEN 0.2 09/06/2022 1011   NITRITE NEGATIVE 06/08/2024 0035   LEUKOCYTESUR NEGATIVE 06/08/2024 0035   Sepsis Labs Recent Labs  Lab 07/10/24 0858 07/11/24 0615  WBC 9.0 8.6   Microbiology No results found for this or any previous visit (from the past 240 hours).   Time coordinating discharge: 35 minutes  SIGNED:   Sophie Mao, MD  [facility] 07/11/2024, 9:33 AM      [1]  Allergies Allergen Reactions   Gnp Glp-1 Daily Support [Germanium] Other (See Comments)    Ileus requiring hospitalization   Metformin  And Related Nausea And Vomiting   Ace Inhibitors Swelling and Other (See Comments)    Ankles swell   Codeine Nausea And Vomiting and Rash   Hydrocodone  Nausea And Vomiting   Tramadol  Nausea And Vomiting   Tylenol  [Acetaminophen ] Itching, Rash and Other (See Comments)    Allergic to prescription strength Tylenol . The patient is taking 500 mg Tylenol  in 2025, however.

## 2024-07-11 NOTE — Plan of Care (Signed)
   Problem: Clinical Measurements: Goal: Ability to maintain clinical measurements within normal limits will improve Outcome: Progressing Goal: Will remain free from infection Outcome: Progressing Goal: Diagnostic test results will improve Outcome: Progressing Goal: Cardiovascular complication will be avoided Outcome: Progressing   Problem: Education: Goal: Knowledge of General Education information will improve Description: Including pain rating scale, medication(s)/side effects and non-pharmacologic comfort measures Outcome: Not Progressing   Problem: Health Behavior/Discharge Planning: Goal: Ability to manage health-related needs will improve Outcome: Not Progressing   Problem: Clinical Measurements: Goal: Respiratory complications will improve Outcome: Not Progressing

## 2024-07-11 NOTE — Plan of Care (Signed)
   Problem: Education: Goal: Knowledge of General Education information will improve Description: Including pain rating scale, medication(s)/side effects and non-pharmacologic comfort measures Outcome: Progressing   Problem: Elimination: Goal: Will not experience complications related to bowel motility Outcome: Progressing   Problem: Safety: Goal: Ability to remain free from injury will improve Outcome: Progressing   Problem: Skin Integrity: Goal: Risk for impaired skin integrity will decrease Outcome: Progressing

## 2024-07-13 ENCOUNTER — Telehealth: Payer: Self-pay

## 2024-07-13 ENCOUNTER — Other Ambulatory Visit: Admitting: *Deleted

## 2024-07-13 NOTE — Telephone Encounter (Signed)
 Copied from CRM #[id number]. Topic: Clinical - Home Health Verbal Orders >> Jul 13, 2024  4:12 PM Anairis L wrote: Caller/Agency: [facility]  Callback Number: [phone] Secure VM Service Requested: Skilled Nursing Frequency: Twice a week for seven weeks.   Any new concerns about the patient? No

## 2024-07-13 NOTE — Patient Instructions (Signed)
 Visit Information  Thank you for taking time to visit with me today. Please don't hesitate to contact me if I can be of assistance to you before our next scheduled telephone appointment.  Our next appointment is by telephone on Wednesday July 22, 2024 at 11:00 am  Please call the care guide team at [phone] if you need to cancel or reschedule your appointment.   Following are the goals we discussed today:  Patient Self Care Activities:  Attend all scheduled provider appointments Call provider office for new concerns or questions  Participate in Transition of Care Program/Attend TOC scheduled calls Take medications as prescribed   Follow fall precautions: clear pathways, wear non skid footwear, keep areas such as hallways/ bathrooms well lit. Use ambulatory device as recommended.  Consider eating potassium rich foods: bananas, oranges, potatoes, spinach Call 911 for severe SOB/ chest pain Continue working with the home health team that is involved in your care Take all recorded blood pressures from home monitoring to your doctor appointments for review If you believe your condition is getting worse- contact your care providers (doctors) promptly- reaching out to your doctor early when you have concerns can prevent you from having to go to the hospital  If you are experiencing a Mental Health or Behavioral Health Crisis or need someone to talk to, please  call the Suicide and Crisis Lifeline: 988 call the [place]  National Suicide Prevention Lifeline: 7346971729 or TTY: (262) 283-3959 TTY 563-254-3923) to talk to a trained counselor call 1-800-273-TALK (toll free, 24 hour hotline) go to [facility] [address] [place] [phone]) call the [facility] Crisis Line: [phone] call 911   Care plan and visit instructions communicated with the patient verbally today. Patient agrees to receive a copy in MyChart. Active MyChart status  and patient understanding of how to access instructions and care plan via MyChart confirmed with patient.     Duha Abair Mckinney Jehan Bonano, RN, BSN, Media Planner  Transitions of Care  [facility] - [facility] [phone]: direct office

## 2024-07-13 NOTE — Transitions of Care (Post Inpatient/ED Visit) (Signed)
 / day # 12 Transition of Care week 3/ day # 12  Visit Note  07/13/2024  Name: Brooke [name] MRN: [id number]          DOB: [birth year]  Situation: Patient enrolled in [facility] 30-day program. Visit completed with patient by telephone.   HIPAA identifiers x 2 verified  Background:  Recent hospitalization October 21-26, 2025 for hypokalemia and hypomagnesemia; weakness Hospital re-admission November 16-30, 2025: colon CA with (R) surgical colectomy/ post-op anemia Hospital re-admission December 4-8, 2025: sent to hospital from surgical provider office: post op abdominal abscess with drain placement Observation hospital visit December 19-20: for ongoing/ recurrent sever hypokalemia: confirmed diuretic therapy discontinued during hospitalization Essentially independent; resides with supportive adult son and daughter-in-law; uses [facility] at baseline for transportation- well established with [employer]; family assists with transportation as indicated; currently using walker for ambulation (did not use regularly prior to recent hospital admission) Fragile state of health, multiple progressing chronic health conditions (6) unplanned hospital admissions x last (6)/ (12) months- with several observeration status admissions and ED visits  Initial Transition Care Management Follow-up Telephone Call Discharge Date and Diagnosis: 06/29/24, Post-op abscess with drain placement   Past Medical History:  Diagnosis Date   Alopecia    Anxiety    Aortic atherosclerosis    Asthma    FOLLOWED BY PCP   Cancer of ascending colon [id number] s/p right colectomy 06/12/2024 06/19/2024   Chronic kidney disease, stage 3a (HCC) 06/25/2024   Eczema    GERD (gastroesophageal reflux disease)    Gout    04-28-2018--- per pt stable , as been a while since last episode   Heart murmur    History of colon polyps    History of syncope 2015   Hyperlipidemia    Hypertension    Hypothyroidism    OA (osteoarthritis) of knee     bilateral   PAT (paroxysmal atrial tachycardia)    Occurred in the setting of ileus and electrolyte abnormalities   PVC's (premature ventricular contractions)    Toxic multinodular goiter    03/ 2003  s/p RAI   Type 2 diabetes mellitus (HCC)    followed by pcp   Ventricular tachycardia, polymorphic (HCC) 01/04/2014   primary cardiologist-- dr wilbert turner (hx monitor 2015 showed couplet PVCs, as trigger)   Wears glasses    Wears partial dentures    upper   Assessment:  I am doing okay, waiting for the home health therapist to get here for my PT.  The wound is looking good from what the doctors are telling me and we're managing the drain fine- they just replaced the drain on Friday of last week.  No more falls since the last one, will see Dr. Norleen about that next week- and I don't think I need to go sooner.  The biggest thing going on right now is just that my potassium keeps running really low- they stopped the fluid pill because of it    Patient denies clinical concerns and sounds to be in no distress throughout TOC 30-day program outreach call   Patient Reported Symptoms: Cognitive Cognitive Status: Normal speech and language skills, Insightful and able to interpret abstract concepts, Alert and oriented to person, place, and time Cognitive/Intellectual Conditions Management [RPT]: None reported or documented in medical history or problem list      Neurological Neurological Review of Symptoms: No symptoms reported    HEENT HEENT Symptoms Reported: No symptoms reported      Cardiovascular Cardiovascular Symptoms  Reported: Swelling in legs or feet Other Cardiovascular Symptoms: Reports ongoing swelling in my legs; confirms was told by hospital discharging team to stop taking diuretic- due to this severe low potassium I have Does patient have uncontrolled Hypertension?: No Cardiovascular Management Strategies: Medication therapy, Routine screening, Coping strategies, Adequate rest   Respiratory Respiratory Symptoms Reported: No symptoms reported Other Respiratory Symptoms: Denies shortness of breath/ cough; Breathing is just fine sounds to be in no respiratory distress throughout [facility] call Respiratory Management Strategies: Asthma action plan, Adequate rest, Medication therapy, Routine screening, Coping strategies  Endocrine Endocrine Symptoms Reported: No symptoms reported Is patient diabetic?: Yes Is patient checking blood sugars at home?: Yes List most recent blood sugar readings, include date and time of day: They are fine; Patient declines review: states home health PT will be here in a minute, I have to get ready    Gastrointestinal Gastrointestinal Symptoms Reported: No symptoms reported Additional Gastrointestinal Details: Reports ongoing fair appetite: I am making myself eat whether I want to or not; confirms having normal and regular BM's every day; last BM was this morning Gastrointestinal Management Strategies: Coping strategies    Genitourinary Genitourinary Symptoms Reported: No symptoms reported Additional Genitourinary Details: Reports clear yellow urine    Integumentary Integumentary Symptoms Reported: Wound, Other Other Integumentary Symptoms: Reports the nnurse is telling us  the wound and incision looks okay; we are changing it every day like they told us  to; the drain is fine: not draining much; they replaced the drain with a new one on Friday and we are managing it fine at home; verbalizes good understanding of drain/ wound care at home without prompting; states the doctors last week all said everything is going fine    Musculoskeletal Musculoskelatal Symptoms Reviewed: Limited mobility, Difficulty walking, Unsteady gait, Weakness Additional Musculoskeletal Details: confirmed uses assistive devices on regular basis, at baseline -- walker; confirmed no new/ recent falls since last [facility] outreach; reports when I fell at the doctor office a  couple of weeks ago, I am sometimes having discomfort, so I will be seeing Dr. Norleen about that next week- I do not want to go see him any sooner than the appointment I have on 07/21/24, I want to wait until then; I am walking okay Musculoskeletal Management Strategies: Coping strategies, Routine screening, Adequate rest   Fall risk Follow up: Falls prevention discussed  Psychosocial           There were no vitals filed for this visit.    Medications Reviewed Today     Reviewed by Delyle Weider M, RN (Registered Nurse) on 07/13/24 at 1202  Med List Status: <None>   Medication Order Taking? Sig Documenting Provider Last Dose Status Informant  albuterol  (VENTOLIN  HFA) 108 (90 Base) MCG/ACT inhaler [id number]  Inhale 2 puffs into the lungs every 4 (four) hours as needed for wheezing or shortness of breath. Jeneal Danita Macintosh, MD  Active Pharmacy Records, Child  allopurinol  (ZYLOPRIM ) 100 MG tablet [id number]  TAKE 1 TABLET BY MOUTH EVERY DAY Norleen Lynwood ORN, MD  Active Pharmacy Records, Child  atropine  1 % ophthalmic solution [id number]  Place 1 drop into the left eye daily. [provider]  Active Pharmacy Records, Child           Med Note (CRUTHIS, CHLOE C   Sat Jun 27, 2024  2:00 PM) Not found at the pt's home by son and daughter. Unsure if the pt is taking this at home.   brimonidine  (ALPHAGAN ) 0.2 %  ophthalmic solution [id number]  3 (three) times daily. 1 drop in the left eye three times daily [provider]  Active Pharmacy Records, Child           Med Note LORNE, CHLOE C   Sat Jun 27, 2024  2:01 PM) Not found at the pt's home by son and daughter. Unsure if the pt is taking this at home.   citalopram  (CELEXA ) 40 MG tablet [id number]  TAKE 1 TABLET BY MOUTH EVERY DAY Norleen Lynwood ORN, MD  Active Pharmacy Records, Child  colchicine  0.6 MG tablet [id number]  TAKE 0.5 TABLETS (0.3 MG TOTAL) BY MOUTH DAILY AS NEEDED (GOUT OR PSUEDOGOUT PAIN).  Patient taking differently: Take  0.3 mg by mouth daily as needed (gout pain).   Joane Artist RAMAN, MD  Active Pharmacy Records, Child  diltiazem  (CARDIZEM  CD) 180 MG 24 hr capsule [id number]  Take 1 capsule (180 mg total) by mouth daily. Shlomo Wilbert SAUNDERS, MD  Active Pharmacy Records, Child  empagliflozin  (JARDIANCE ) 25 MG TABS tablet [id number]  Take 25 mg by mouth daily. [provider]  Active Pharmacy Records, Child  erythromycin  ophthalmic ointment [id number]  Place 1 Application into the left eye at bedtime. [provider]  Active Pharmacy Records, Child  famotidine  (PEPCID ) 20 MG tablet [id number]  Take 1 tablet (20 mg total) by mouth 2 (two) times daily. Jeneal Danita Macintosh, MD  Active Pharmacy Records, Child           Med Note LORNE, SHEFFIELD BROCKS   Dju Jun 27, 2024  2:01 PM) Not found at the pt's home by son and daughter. Unsure if the pt is taking this at home.   Fluticasone -Umeclidin-Vilant (TRELEGY ELLIPTA ) 200-62.5-25 MCG/ACT AEPB [id number]  Inhale 1 puff into the lungs daily. Jeneal Danita Macintosh, MD  Active Pharmacy Records, Child           Med Note LORNE, SHEFFIELD BROCKS   Dju Jun 27, 2024  2:01 PM) Not found at the pt's home by son and daughter. Unsure if the pt is taking this at home.   latanoprost  (XALATAN ) 0.005 % ophthalmic solution [id number]  Place 1 drop into the left eye at bedtime. [provider]  Active Pharmacy Records, Child           Med Note LORNE, CHLOE C   Sat Jun 27, 2024  2:02 PM) Not found at the pt's home by son and daughter. Unsure if the pt is taking this at home.   levocetirizine (XYZAL ) 5 MG tablet [id number]  Take 1 tablet (5 mg total) by mouth daily. Jeneal Danita Macintosh, MD  Active Pharmacy Records, Child           Med Note LORNE, SHEFFIELD BROCKS   Dju Jun 27, 2024  2:02 PM) Not found at the pt's home by son and daughter. Unsure if the pt is taking this at home.   meclizine  (ANTIVERT ) 25 MG tablet [id number]  TAKE 1 TABLET BY MOUTH 3 TIMES A DAY AS NEEDED FOR DIZZINESS  Norleen Lynwood ORN, MD  Active Pharmacy Records, Child           Med Note LORNE, [place] C   Sat Jun 27, 2024  2:02 PM) Not found at the pt's home by son and daughter. Unsure if the pt is taking this at home.   metoprolol  succinate (TOPROL -XL) 100 MG 24 hr tablet [id number]  Take 1 tablet (100 mg total) by mouth daily. Take with or immediately following a meal.  Patient taking differently: Take 100 mg by mouth in the morning and at bedtime.   Cherlyn Labella, MD  Active Pharmacy Records, Child  ondansetron  (ZOFRAN -ODT) 4 MG disintegrating tablet [id number]  Take 1 tablet (4 mg total) by mouth every 8 (eight) hours as needed for nausea or vomiting. Odell Celinda Balo, MD  Active   pantoprazole  (PROTONIX ) 40 MG tablet [id number]  Take 1 tablet (40 mg total) by mouth daily. Cherlyn Labella, MD  Active Pharmacy Records, Child           Med Note LORNE, SHEFFIELD BROCKS   Dju Jun 27, 2024  2:02 PM) Not found at the pt's home by son and daughter. Unsure if the pt is taking this at home.   potassium chloride  SA (KLOR-CON  M) 20 MEQ tablet [id number]  Take 1 tablet (20 mEq total) by mouth 2 (two) times daily. Cheryle Page, MD  Active   rosuvastatin  (CRESTOR ) 40 MG tablet [id number]  TAKE 1 TABLET BY MOUTH EVERY DAY Shlomo Wilbert SAUNDERS, MD  Active Pharmacy Records, Child  traZODone  (DESYREL ) 100 MG tablet [id number]  TAKE 1 TABLET (100 MG TOTAL) BY MOUTH AT BEDTIME AS NEEDED. FOR SLEEP Norleen Lynwood ORN, MD  Active Pharmacy Records, Child           Med Note LORNE, SHEFFIELD BROCKS   Dju Jun 27, 2024  2:02 PM) Not found at the pt's home by son and daughter. Unsure if the pt is taking this at home.            Recommendation:   Acute PCP follow-up as scheduled 07/21/24: patient declined need/ assistance in scheduling for sooner appointment. Continue Current Plan of Care  Follow Up Plan:   Telephone follow-up in 1 week- as scheduled 07/22/24  Pls call/ message for questions,  Trestin Vences Mckinney Areatha Kalata, RN, BSN, CCRN Alumnus RN Care  Manager  Transitions of Care  [facility] - [facility] [phone]: direct office

## 2024-07-14 NOTE — Telephone Encounter (Signed)
 Ok for [employer]

## 2024-07-14 NOTE — Telephone Encounter (Signed)
 Called and gave verbals.

## 2024-07-20 ENCOUNTER — Other Ambulatory Visit: Payer: Self-pay | Admitting: General Surgery

## 2024-07-20 DIAGNOSIS — K651 Peritoneal abscess: Secondary | ICD-10-CM

## 2024-07-21 ENCOUNTER — Encounter: Payer: Self-pay | Admitting: Internal Medicine

## 2024-07-21 ENCOUNTER — Ambulatory Visit (INDEPENDENT_AMBULATORY_CARE_PROVIDER_SITE_OTHER): Admitting: Internal Medicine

## 2024-07-21 VITALS — BP 122/78 | HR 72 | Temp 97.9°F | Ht 63.0 in

## 2024-07-21 DIAGNOSIS — R790 Abnormal level of blood mineral: Secondary | ICD-10-CM | POA: Diagnosis not present

## 2024-07-21 DIAGNOSIS — Z7984 Long term (current) use of oral hypoglycemic drugs: Secondary | ICD-10-CM | POA: Diagnosis not present

## 2024-07-21 DIAGNOSIS — E876 Hypokalemia: Secondary | ICD-10-CM

## 2024-07-21 DIAGNOSIS — N1832 Chronic kidney disease, stage 3b: Secondary | ICD-10-CM

## 2024-07-21 DIAGNOSIS — K651 Peritoneal abscess: Secondary | ICD-10-CM

## 2024-07-21 DIAGNOSIS — I5189 Other ill-defined heart diseases: Secondary | ICD-10-CM

## 2024-07-21 DIAGNOSIS — T8143XA Infection following a procedure, organ and space surgical site, initial encounter: Secondary | ICD-10-CM

## 2024-07-21 DIAGNOSIS — M1000 Idiopathic gout, unspecified site: Secondary | ICD-10-CM

## 2024-07-21 DIAGNOSIS — E1122 Type 2 diabetes mellitus with diabetic chronic kidney disease: Secondary | ICD-10-CM | POA: Diagnosis not present

## 2024-07-21 LAB — CBC WITH DIFFERENTIAL/PLATELET
Basophils Absolute: 0 K/uL (ref 0.0–0.1)
Basophils Relative: 0.6 % (ref 0.0–3.0)
Eosinophils Absolute: 0 K/uL (ref 0.0–0.7)
Eosinophils Relative: 0.2 % (ref 0.0–5.0)
HCT: 31.9 % — ABNORMAL LOW (ref 36.0–46.0)
Hemoglobin: 10.5 g/dL — ABNORMAL LOW (ref 12.0–15.0)
Lymphocytes Relative: 20.8 % (ref 12.0–46.0)
Lymphs Abs: 1.7 K/uL (ref 0.7–4.0)
MCHC: 32.9 g/dL (ref 30.0–36.0)
MCV: 83.2 fl (ref 78.0–100.0)
Monocytes Absolute: 0.9 K/uL (ref 0.1–1.0)
Monocytes Relative: 11.8 % (ref 3.0–12.0)
Neutro Abs: 5.4 K/uL (ref 1.4–7.7)
Neutrophils Relative %: 66.6 % (ref 43.0–77.0)
Platelets: 295 K/uL (ref 150.0–400.0)
RBC: 3.84 Mil/uL — ABNORMAL LOW (ref 3.87–5.11)
RDW: 17.1 % — ABNORMAL HIGH (ref 11.5–15.5)
WBC: 8 K/uL (ref 4.0–10.5)

## 2024-07-21 LAB — HEPATIC FUNCTION PANEL
ALT: 10 U/L (ref 3–35)
AST: 17 U/L (ref 5–37)
Albumin: 2.4 g/dL — ABNORMAL LOW (ref 3.5–5.2)
Alkaline Phosphatase: 129 U/L — ABNORMAL HIGH (ref 39–117)
Bilirubin, Direct: 0.1 mg/dL (ref 0.1–0.3)
Total Bilirubin: 0.4 mg/dL (ref 0.2–1.2)
Total Protein: 5.6 g/dL — ABNORMAL LOW (ref 6.0–8.3)

## 2024-07-21 LAB — BASIC METABOLIC PANEL WITH GFR
BUN: 6 mg/dL (ref 6–23)
CO2: 24 meq/L (ref 19–32)
Calcium: 8.1 mg/dL — ABNORMAL LOW (ref 8.4–10.5)
Chloride: 113 meq/L — ABNORMAL HIGH (ref 96–112)
Creatinine, Ser: 0.95 mg/dL (ref 0.40–1.20)
GFR: 59.85 mL/min — ABNORMAL LOW
Glucose, Bld: 89 mg/dL (ref 70–99)
Potassium: 3.4 meq/L — ABNORMAL LOW (ref 3.5–5.1)
Sodium: 143 meq/L (ref 135–145)

## 2024-07-21 LAB — MAGNESIUM: Magnesium: 1.4 mg/dL — ABNORMAL LOW (ref 1.5–2.5)

## 2024-07-21 MED ORDER — ALLOPURINOL 100 MG PO TABS: 100.0000 mg | ORAL_TABLET | Freq: Every day | ORAL | 3 refills | Status: AC

## 2024-07-21 MED ORDER — TRAZODONE HCL 100 MG PO TABS: 100.0000 mg | ORAL_TABLET | Freq: Every evening | ORAL | 1 refills | Status: DC | PRN

## 2024-07-21 MED ORDER — EMPAGLIFLOZIN 25 MG PO TABS: 25.0000 mg | ORAL_TABLET | Freq: Every day | ORAL | 3 refills | Status: AC

## 2024-07-21 MED ORDER — PANTOPRAZOLE SODIUM 40 MG PO TBEC: 40.0000 mg | DELAYED_RELEASE_TABLET | Freq: Every day | ORAL | 3 refills | Status: AC

## 2024-07-21 MED ORDER — CITALOPRAM HYDROBROMIDE 40 MG PO TABS: 40.0000 mg | ORAL_TABLET | Freq: Every day | ORAL | 3 refills | Status: AC

## 2024-07-21 MED ORDER — OXYCODONE-ACETAMINOPHEN 7.5-325 MG PO TABS: 1.0000 | ORAL_TABLET | ORAL | 0 refills | Status: AC | PRN

## 2024-07-21 NOTE — Assessment & Plan Note (Signed)
Also for f/u lab today

## 2024-07-21 NOTE — Assessment & Plan Note (Signed)
 Pt without edema today, ok to hold further diuretic at this time

## 2024-07-21 NOTE — Assessment & Plan Note (Addendum)
 With ckd3b  Lab Results  Component Value Date   HGBA1C 5.4 06/02/2024   Stable, pt to continue current medical treatment jardiance  25 qd

## 2024-07-21 NOTE — Assessment & Plan Note (Signed)
 Pt with persistent pain after drainage tube replaced, for limited refill percocet 7.5 325 prn

## 2024-07-21 NOTE — Progress Notes (Signed)
 Patient ID: FLORANCE PAOLILLO, female   DOB: Jan 14, 1951, 73 y.o.   MRN: [id number]        Chief Complaint: follow up post hospn dc 19- 20 2025 with low K and low mag, f/u edema and abd pain        HPI:  KEILLY FATULA is a 73 y.o. female here overall doing ok, except has recurring severe pain with the drain that required replacement recently.  Plan is to continue drain for 1 mo, pt asking for refill percocet 7.5 325.   Recently hospd with low K likely diuretic related, and low Magnesium    Edema has not returned post d/c  Pt holding diuretics but is finishing her short course of potassium.  Pt denies chest pain, increased sob or doe, wheezing, orthopnea, PND, increased LE swelling, palpitations, dizziness or syncope.   Pt denies polydipsia, polyuria, or new focal neuro s/s.          Wt Readings from Last 3 Encounters:  07/11/24 175 lb 4.3 oz (79.5 kg)  07/10/24 178 lb 1.6 oz (80.8 kg)  07/08/24 180 lb 2 oz (81.7 kg)   BP Readings from Last 3 Encounters:  07/21/24 122/78  07/11/24 135/60  07/10/24 (!) 138/57         Past Medical History:  Diagnosis Date   Alopecia    Anxiety    Aortic atherosclerosis    Asthma    FOLLOWED BY PCP   Cancer of ascending colon pT3pN0 s/p right colectomy 06/12/2024 06/19/2024   Chronic kidney disease, stage 3a (HCC) 06/25/2024   Eczema    GERD (gastroesophageal reflux disease)    Gout    04-28-2018--- per pt stable , as been a while since last episode   Heart murmur    History of colon polyps    History of syncope 2015   Hyperlipidemia    Hypertension    Hypothyroidism    OA (osteoarthritis) of knee    bilateral   PAT (paroxysmal atrial tachycardia)    Occurred in the setting of ileus and electrolyte abnormalities   PVC's (premature ventricular contractions)    Toxic multinodular goiter    03/ 2003  s/p RAI   Type 2 diabetes mellitus (HCC)    followed by pcp   Ventricular tachycardia, polymorphic (HCC) 01/04/2014   primary cardiologist-- dr wilbert  turner (hx monitor 2015 showed couplet PVCs, as trigger)   Wears glasses    Wears partial dentures    upper   Past Surgical History:  Procedure Laterality Date   ABDOMINAL HYSTERECTOMY  10/22/1999   WITH BSO   CATARACT EXTRACTION W/ INTRAOCULAR LENS IMPLANT Left YRS AGO   COLONOSCOPY     COLONOSCOPY N/A 06/11/2024   Procedure: COLONOSCOPY;  Surgeon: Stacia Glendia FORBES, MD;  Location: [facility];  Service: Gastroenterology;  Laterality: N/A;   EXCISION ABDOMINAL WALL MASS  12-20-2005   dr merrilyn @[facility]    neruofibroma   IR CV LINE INJECTION  07/10/2024   LAPAROSCOPIC CHOLECYSTECTOMY  10/01/2002   dr merrilyn @[facility]    LAPAROSCOPIC PARTIAL COLECTOMY N/A 06/12/2024   Procedure: LAPAROSCOPIC ASSISTED RIGHT COLECTOMY;  Surgeon: Tanda Locus, MD;  Location: [facility];  Service: General;  Laterality: N/A;  LAPAROSCOPIC ASSISTED RIGHT COLECTOMY WITH EXTENDED RESECTION OF DISTAL ILEUM   LEFT HEART CATHETERIZATION WITH CORONARY ANGIOGRAM N/A 01/07/2014   Procedure: LEFT HEART CATHETERIZATION WITH CORONARY ANGIOGRAM;  Surgeon: Peter M Jordan, MD;  Location: [facility] CATH LAB;  Service: Cardiovascular;  Laterality: N/A;  RASTELLI PROCEDURE  6/98 neg   RENAL ARTERY STENT Left 11/2003   angioplasty and stenting   RENAL ARTERY STENT Left 02/2005   re-stenting   TOTAL KNEE ARTHROPLASTY Right 05/05/2018   Procedure: RIGHT TOTAL KNEE ARTHROPLASTY;  Surgeon: Liam Lerner, MD;  Location: [facility];  Service: Orthopedics;  Laterality: Right;   TOTAL KNEE ARTHROPLASTY Left 09/30/2018   Procedure: TOTAL KNEE ARTHROPLASTY;  Surgeon: Sheril Coy, MD;  Location: [facility];  Service: Orthopedics;  Laterality: Left;    reports that she has never smoked. She has never been exposed to tobacco smoke. She has never used smokeless tobacco. She reports that she does not drink alcohol and does not use drugs. family history includes Diabetes in her mother; Heart disease in her father. Allergies[1] Medications Ordered Prior to  Encounter[2]      ROS:  All others reviewed and negative.  Objective        PE:  BP 122/78 (BP Location: Right Arm, Patient Position: Sitting, Cuff Size: Normal)   Pulse 72   Temp 97.9 F (36.6 C) (Oral)   Ht 5' 3 (1.6 m)   SpO2 99%   BMI 31.05 kg/m                 Constitutional: Pt appears in NAD               HENT: Head: NCAT.                Right Ear: External ear normal.                 Left Ear: External ear normal.                Eyes: . Pupils are equal, round, and reactive to light. Conjunctivae and EOM are normal               Nose: without d/c or deformity               Neck: Neck supple. Gross normal ROM               Cardiovascular: Normal rate and regular rhythm.                 Pulmonary/Chest: Effort normal and breath sounds without rales or wheezing.                Abd:  Soft, NT, ND, + BS, no organomegaly               Neurological: Pt is alert. At baseline orientation, motor grossly intact               Skin: Skin is warm. No rashes, no other new lesions, LE edema - none               Psychiatric: Pt behavior is normal without agitation   Micro: none  Cardiac tracings I have personally interpreted today:  none  Pertinent Radiological findings (summarize): none   Lab Results  Component Value Date   WBC 8.6 07/11/2024   HGB 8.6 (L) 07/11/2024   HCT 26.0 (L) 07/11/2024   PLT 284 07/11/2024   GLUCOSE 71 07/11/2024   CHOL 89 (L) 06/01/2024   TRIG 72 06/15/2024   HDL 51 06/01/2024   LDLDIRECT 139.1 09/05/2009   LDLCALC 22 06/01/2024   ALT 12 07/11/2024   AST 21 07/11/2024   NA 146 (H) 07/11/2024   K 3.2 (L) 07/11/2024   CL 113 (  H) 07/11/2024   CREATININE 1.02 (H) 07/11/2024   BUN <5 (L) 07/11/2024   CO2 23 07/11/2024   TSH 2.000 05/13/2024   INR 1.2 06/08/2024   HGBA1C 5.4 06/02/2024   MICROALBUR 1.1 10/02/2023   Assessment/Plan:  ERMEL VERNE is a 73 y.o. Black or African American [2] female with  has a past medical history of Alopecia,  Anxiety, Aortic atherosclerosis, Asthma, Cancer of ascending colon pT3pN0 s/p right colectomy 06/12/2024 (06/19/2024), Chronic kidney disease, stage 3a (HCC) (06/25/2024), Eczema, GERD (gastroesophageal reflux disease), Gout, Heart murmur, History of colon polyps, History of syncope (2015), Hyperlipidemia, Hypertension, Hypothyroidism, OA (osteoarthritis) of knee, PAT (paroxysmal atrial tachycardia), PVC's (premature ventricular contractions), Toxic multinodular goiter, Type 2 diabetes mellitus (HCC), Ventricular tachycardia, polymorphic (HCC) (01/04/2014), Wears glasses, and Wears partial dentures.  DM2 (diabetes mellitus, type 2) (HCC) With ckd3b  Lab Results  Component Value Date   HGBA1C 5.4 06/02/2024   Stable, pt to continue current medical treatment jardiance  25 qd   Hypokalemia Recent 2.4, then 3.2 at d/c dec 20, now for f/u lab , pt to finish current rx potassium pending lab today, and then hold further.    Diastolic dysfunction Pt without edema today, ok to hold further diuretic at this time  Low magnesium  level Also for f/u lab today  Postoperative intra-abdominal abscess (HCC) Pt with persistent pain after drainage tube replaced, for limited refill percocet 7.5 325 prn  Followup: Return in about 3 months (around 10/19/2024).  Lynwood Rush, MD 07/21/2024 2:02 PM [facility]     [1]  Allergies Allergen Reactions   Gnp Glp-1 Daily Support [Germanium] Other (See Comments)    Ileus requiring hospitalization   Metformin  And Related Nausea And Vomiting   Ace Inhibitors Swelling and Other (See Comments)    Ankles swell   Codeine Nausea And Vomiting and Rash   Hydrocodone  Nausea And Vomiting   Tramadol  Nausea And Vomiting   Tylenol  [Acetaminophen ] Itching, Rash and Other (See Comments)    Allergic to prescription strength Tylenol . The patient is taking 500 mg Tylenol  in 2025, however.  [2]  Current  Outpatient Medications on File Prior to Visit  Medication Sig Dispense Refill   albuterol  (VENTOLIN  HFA) 108 (90 Base) MCG/ACT inhaler Inhale 2 puffs into the lungs every 4 (four) hours as needed for wheezing or shortness of breath. 18 g 1   atropine  1 % ophthalmic solution Place 1 drop into the left eye daily.     Azelastine  HCl 137 MCG/SPRAY SOLN Place into both nostrils.     brimonidine  (ALPHAGAN ) 0.2 % ophthalmic solution 3 (three) times daily. 1 drop in the left eye three times daily     colchicine  0.6 MG tablet TAKE 0.5 TABLETS (0.3 MG TOTAL) BY MOUTH DAILY AS NEEDED (GOUT OR PSUEDOGOUT PAIN). (Patient taking differently: Take 0.3 mg by mouth daily as needed (gout pain).) 45 tablet 1   diltiazem  (CARDIZEM  CD) 180 MG 24 hr capsule Take 1 capsule (180 mg total) by mouth daily. 90 capsule 3   diltiazem  (CARDIZEM  CD) 240 MG 24 hr capsule Take 240 mg by mouth daily.     erythromycin  ophthalmic ointment Place 1 Application into the left eye at bedtime.     famotidine  (PEPCID ) 20 MG tablet Take 1 tablet (20 mg total) by mouth 2 (two) times daily. 60 tablet 5   Fluticasone -Umeclidin-Vilant (TRELEGY ELLIPTA ) 200-62.5-25 MCG/ACT AEPB Inhale 1 puff into the lungs daily. 28  each 5   latanoprost  (XALATAN ) 0.005 % ophthalmic solution Place 1 drop into the left eye at bedtime.     levocetirizine (XYZAL ) 5 MG tablet Take 1 tablet (5 mg total) by mouth daily. 30 tablet 5   meclizine  (ANTIVERT ) 25 MG tablet TAKE 1 TABLET BY MOUTH 3 TIMES A DAY AS NEEDED FOR DIZZINESS 90 tablet 1   metoprolol  succinate (TOPROL -XL) 100 MG 24 hr tablet Take 1 tablet (100 mg total) by mouth daily. Take with or immediately following a meal. (Patient taking differently: Take 100 mg by mouth in the morning and at bedtime.) 30 tablet 0   ondansetron  (ZOFRAN -ODT) 4 MG disintegrating tablet Take 1 tablet (4 mg total) by mouth every 8 (eight) hours as needed for nausea or vomiting. 30 tablet 0   rosuvastatin  (CRESTOR ) 40 MG tablet TAKE 1  TABLET BY MOUTH EVERY DAY 90 tablet 1   No current facility-administered medications on file prior to visit.

## 2024-07-21 NOTE — Assessment & Plan Note (Addendum)
 Recent 2.4, then 3.2 at d/c dec 20, now for f/u lab , pt to finish current rx potassium pending lab today, and then hold further.

## 2024-07-21 NOTE — Patient Instructions (Signed)
 Ok to continue to hold on taking any fluid pills for right now  [facility] to hold off on taking more of the potassium you have at home, though this can be checked  Please continue all other medications as before, and refills have been done for the medications you mentioned, including a small prescription for the oxycodone    Please have the pharmacy call with any other refills you may need.  Please keep your appointments with your specialists as you may have planned  Please go to the LAB at the blood drawing area for the tests to be done  You will be contacted by phone if any changes need to be made immediately.  Otherwise, you will receive a letter about your results with an explanation, but please check with MyChart first.  Please make an Appointment to return in 3 months, or sooner if needed

## 2024-07-22 ENCOUNTER — Ambulatory Visit: Payer: Self-pay | Admitting: Internal Medicine

## 2024-07-22 ENCOUNTER — Other Ambulatory Visit: Payer: Self-pay | Admitting: Internal Medicine

## 2024-07-22 ENCOUNTER — Other Ambulatory Visit: Payer: Self-pay | Admitting: *Deleted

## 2024-07-22 DIAGNOSIS — I4719 Other supraventricular tachycardia: Secondary | ICD-10-CM | POA: Diagnosis not present

## 2024-07-22 DIAGNOSIS — R002 Palpitations: Secondary | ICD-10-CM | POA: Diagnosis not present

## 2024-07-22 MED ORDER — MAGNESIUM 400 MG PO TABS: ORAL_TABLET | ORAL | 0 refills | Status: AC

## 2024-07-22 NOTE — Transitions of Care (Post Inpatient/ED Visit) (Signed)
 " Transition of Care week 4/ day # 21  Visit Note  07/22/2024  Name: Brooke [name] MRN: [id number]          DOB: Nov 27, 1950  Situation: Patient enrolled in [facility] 30-day program. Visit completed with patient by telephone.   HIPAA identifiers x 2 verified  Background:  Recent hospitalization October 21-26, 2025 for hypokalemia and hypomagnesemia; weakness Essentially independent; resides with supportive adult son and daughter-in-law; uses [facility] at baseline for transportation- well established with [employer]; family assists with transportation as indicated; currently using walker for ambulation (did not use regularly prior to recent hospital admission) Fragile state of health, multiple progressing chronic health conditions (4) unplanned hospital admissions x last (6)/ (12) months  Initial Transition Care Management Follow-up Telephone Call Discharge Date and Diagnosis: 06/29/24, Post-op abscess with drain placement   Past Medical History:  Diagnosis Date   Alopecia    Anxiety    Aortic atherosclerosis    Asthma    FOLLOWED BY PCP   Cancer of ascending colon [id number] s/p right colectomy 06/12/2024 06/19/2024   Chronic kidney disease, stage 3a (HCC) 06/25/2024   Eczema    GERD (gastroesophageal reflux disease)    Gout    04-28-2018--- per pt stable , as been a while since last episode   Heart murmur    History of colon polyps    History of syncope 2015   Hyperlipidemia    Hypertension    Hypothyroidism    OA (osteoarthritis) of knee    bilateral   PAT (paroxysmal atrial tachycardia)    Occurred in the setting of ileus and electrolyte abnormalities   PVC's (premature ventricular contractions)    Toxic multinodular goiter    03/ 2003  s/p RAI   Type 2 diabetes mellitus (HCC)    followed by pcp   Ventricular tachycardia, polymorphic (HCC) 01/04/2014   primary cardiologist-- dr wilbert turner (hx monitor 2015 showed couplet PVCs, as trigger)   Wears glasses    Wears partial dentures     upper   Assessment:  I am doing okay, appetite is better, eating better; going to the bathroom normally.  The nurse is still coming out and changes the surgical dressing twice a week, and we do it here at home in between her visits.  The wound is looking good and we are changing the dressings and emptying the drain every day, once a day, and not having any problems at all with it.  Going to the scan next week, I hope they will say the drain can be taken out.  Thanks so much for going over all this lab work with me.  I will listen out for a call from Dr. Nicola nurse--- but I am glad you are telling me all this, so I know what to expect.  No more falls, using my walker   Patient denies clinical concerns and sounds to be in no distress throughout [facility] 30-day program outreach call   Patient Reported Symptoms: Cognitive Cognitive Status: Normal speech and language skills, Alert and oriented to person, place, and time, Insightful and able to interpret abstract concepts Cognitive/Intellectual Conditions Management [RPT]: None reported or documented in medical history or problem list      Neurological Neurological Review of Symptoms: No symptoms reported    HEENT HEENT Symptoms Reported: No symptoms reported      Cardiovascular Cardiovascular Symptoms Reported: No symptoms reported Other Cardiovascular Symptoms: Reports swelling I had is all gone now- even Dr. Norleen said  yesterday that there was no swelling Does patient have uncontrolled Hypertension?: No Cardiovascular Management Strategies: Coping strategies, Adequate rest, Routine screening, Medication therapy  Respiratory Respiratory Symptoms Reported: No symptoms reported Other Respiratory Symptoms: Denies shortness of breath/ cough; confirms using maintenance inhaler as prescribed; reports has not needed rescue inhaler for a long time; sounds to be in no respiratory distress throughout [facility] call Respiratory Management Strategies: Asthma  action plan, Adequate rest, Medication therapy, Routine screening, Coping strategies  Endocrine Endocrine Symptoms Reported: No symptoms reported Is patient diabetic?: Yes Is patient checking blood sugars at home?: Yes List most recent blood sugar readings, include date and time of day: Sugar today before eating was 96; confirms continues taking oral medication jardiance  as prescribed: denies recent low blood sugars at home: reports the highest I have had was 164- but that was right after eating; reports consistent fasting blood sugars between 90's to mid- 100's    Gastrointestinal Gastrointestinal Symptoms Reported: No symptoms reported, Other Other Gastrointestinal Symptoms: JP drain for abdominal abcess post- recent hospitalization/ surgery Additional Gastrointestinal Details: My appetite seems a little better lately; I am eating good; Reports :normal and regular BM's:  having BM's every day without fail- had a normal BM this morning;  confirms continues to maintain surgical abdominal drain- we empty it once a day, it has a little bit of thick dark fluid still coming out from it, the home health nurse looks at it too and she said it looked fine; reports very good understanding of drain management at home without additional prompting; confirms plans to attend scheduled CT scan/ IR visit for drain and abscess assessment as scheduled 07/28/24; reports intermittent- self limiting pain- discomfort at drain site: confirmed she received prescription for pain medication at yesterday's scheduled PCP office visit: has not yet picked up, reports to pick up as soon as the pharmacy tells it is ready Gastrointestinal Management Strategies: Coping strategies, Adequate rest    Genitourinary Genitourinary Symptoms Reported: No symptoms reported Additional Genitourinary Details: Going to pee normally- it is clear and light yellow urine    Integumentary Integumentary Symptoms Reported: Wound,  Other Other Integumentary Symptoms: Abdominal JP drain/ surgical incision: recent abdominal abscess post- recent abdominal surgery Additional Integumentary Details: JP drain as per GI assessment; reports surgical incision site is closing up real good; the home health nurse looked at it and said it looked great; confirms she is doing daily dressing changes to incision site as directed- family assisting- confirms skilled nursing making home visits 2 times per week for ongoing assessment dressing changes and JP drain assessment- management: reports we are managing fine at home in between the nurse visits  verbalizes excellent understanding of incision site/ JP drain site management without additional prompting Skin Management Strategies: Routine screening, Dressing changes, Coping strategies, Adequate rest, Medical device  Musculoskeletal Musculoskelatal Symptoms Reviewed: Limited mobility, Difficulty walking, Unsteady gait, Weakness Additional Musculoskeletal Details: confirmed uses assistive devices on regular basis, at baseline -- walker: confirms no new/ recent falls post- last reported fall at time of hospital follow up PCP office visit: reports after I fell at the Dr. Nicola office, I initially had some pain in my hip, but that is now completely gone now.  I told Dr. Norleen about it yesterday Musculoskeletal Management Strategies: Medical device, Coping strategies, Adequate rest, Routine screening   Patient at Risk for Falls Due to: History of fall(s), Impaired balance/gait, Impaired mobility, Orthopedic patient, Medication side effect Fall risk Follow up: Falls prevention discussed, Education provided  Psychosocial Psychosocial Symptoms Reported: No symptoms reported         There were no vitals filed for this visit.    Medications Reviewed Today     Reviewed by Antwonette Feliz M, RN (Registered Nurse) on 07/22/24 at 1122  Med List Status: <None>   Medication Order Taking? Sig  Documenting Provider Last Dose Status Informant  albuterol  (VENTOLIN  HFA) 108 (90 Base) MCG/ACT inhaler [id number]  Inhale 2 puffs into the lungs every 4 (four) hours as needed for wheezing or shortness of breath. Jeneal Danita Macintosh, MD  Active Pharmacy Records, Child  allopurinol  (ZYLOPRIM ) 100 MG tablet [id number]  Take 1 tablet (100 mg total) by mouth daily. Norleen Lynwood ORN, MD  Active   atropine  1 % ophthalmic solution [id number]  Place 1 drop into the left eye daily. [provider]  Active Pharmacy Records, Child           Med Note (CRUTHIS, CHLOE C   Sat Jun 27, 2024  2:00 PM) Not found at the pt's home by son and daughter. Unsure if the pt is taking this at home.   Azelastine  HCl 137 MCG/SPRAY SOLN [id number]  Place into both nostrils. [provider]  Active   brimonidine  (ALPHAGAN ) 0.2 % ophthalmic solution [id number]  3 (three) times daily. 1 drop in the left eye three times daily [provider]  Active Pharmacy Records, Child           Med Note LORNE, CHLOE C   Sat Jun 27, 2024  2:01 PM) Not found at the pt's home by son and daughter. Unsure if the pt is taking this at home.   citalopram  (CELEXA ) 40 MG tablet [id number]  Take 1 tablet (40 mg total) by mouth daily. Norleen Lynwood ORN, MD  Active   colchicine  0.6 MG tablet [id number]  TAKE 0.5 TABLETS (0.3 MG TOTAL) BY MOUTH DAILY AS NEEDED (GOUT OR PSUEDOGOUT PAIN).  Patient taking differently: Take 0.3 mg by mouth daily as needed (gout pain).   Corey, Evan S, MD  Active Pharmacy Records, Child  diltiazem  (CARDIZEM  CD) 180 MG 24 hr capsule [id number]  Take 1 capsule (180 mg total) by mouth daily. Shlomo Wilbert SAUNDERS, MD  Active Pharmacy Records, Child  diltiazem  (CARDIZEM  CD) 240 MG 24 hr capsule [id number]  Take 240 mg by mouth daily. [provider]  Active   empagliflozin  (JARDIANCE ) 25 MG TABS tablet [id number]  Take 1 tablet (25 mg total) by mouth daily. Norleen Lynwood ORN, MD  Active   erythromycin  ophthalmic  ointment [id number]  Place 1 Application into the left eye at bedtime. [provider]  Active Pharmacy Records, Child  famotidine  (PEPCID ) 20 MG tablet [id number]  Take 1 tablet (20 mg total) by mouth 2 (two) times daily. Jeneal Danita Macintosh, MD  Active Pharmacy Records, Child           Med Note LORNE, SHEFFIELD BROCKS   Dju Jun 27, 2024  2:01 PM) Not found at the pt's home by son and daughter. Unsure if the pt is taking this at home.   Fluticasone -Umeclidin-Vilant (TRELEGY ELLIPTA ) 200-62.5-25 MCG/ACT AEPB [id number]  Inhale 1 puff into the lungs daily. Jeneal Danita Macintosh, MD  Active Pharmacy Records, Child           Med Note LORNE, SHEFFIELD BROCKS   Dju Jun 27, 2024  2:01 PM) Not found at the pt's home by son and daughter. Unsure if the pt is taking this at home.  latanoprost  (XALATAN ) 0.005 % ophthalmic solution [id number]  Place 1 drop into the left eye at bedtime. [provider]  Active Pharmacy Records, Child           Med Note LORNE, CHLOE C   Sat Jun 27, 2024  2:02 PM) Not found at the pt's home by son and daughter. Unsure if the pt is taking this at home.   levocetirizine (XYZAL ) 5 MG tablet [id number]  Take 1 tablet (5 mg total) by mouth daily. Jeneal Danita Macintosh, MD  Active Pharmacy Records, Child           Med Note LORNE, SHEFFIELD BROCKS   Dju Jun 27, 2024  2:02 PM) Not found at the pt's home by son and daughter. Unsure if the pt is taking this at home.   Magnesium  400 MG TABS [id number]  1 tab by mouth once daily for 5 days Norleen Lynwood ORN, MD  Active   meclizine  (ANTIVERT ) 25 MG tablet [id number]  TAKE 1 TABLET BY MOUTH 3 TIMES A DAY AS NEEDED FOR DIZZINESS Norleen Lynwood ORN, MD  Active Pharmacy Records, Child           Med Note LORNE, [place] C   Sat Jun 27, 2024  2:02 PM) Not found at the pt's home by son and daughter. Unsure if the pt is taking this at home.   metoprolol  succinate (TOPROL -XL) 100 MG 24 hr tablet [id number]  Take 1 tablet (100 mg total) by mouth daily. Take  with or immediately following a meal.  Patient taking differently: Take 100 mg by mouth in the morning and at bedtime.   Cherlyn Labella, MD  Active Pharmacy Records, Child  ondansetron  (ZOFRAN -ODT) 4 MG disintegrating tablet [id number]  Take 1 tablet (4 mg total) by mouth every 8 (eight) hours as needed for nausea or vomiting. Odell Celinda Balo, MD  Active   oxyCODONE -acetaminophen  (PERCOCET) 7.5-325 MG tablet [id number]  Take 1 tablet by mouth every 4 (four) hours as needed for severe pain (pain score 7-10). Norleen Lynwood ORN, MD  Active   pantoprazole  (PROTONIX ) 40 MG tablet [id number]  Take 1 tablet (40 mg total) by mouth daily. Norleen Lynwood ORN, MD  Active   rosuvastatin  (CRESTOR ) 40 MG tablet [id number]  TAKE 1 TABLET BY MOUTH EVERY DAY Shlomo Wilbert SAUNDERS, MD  Active Pharmacy Records, Child  traZODone  (DESYREL ) 100 MG tablet [id number]  Take 1 tablet (100 mg total) by mouth at bedtime as needed. for sleep Norleen Lynwood ORN, MD  Active            Recommendation:   Specialty provider follow-up: 07/28/24- CT scan and IR provider as scheduled; 07/30/24- Asthma- Allergy provider routine office visit, as scheduled Continue Current Plan of Care  Follow Up Plan:   Telephone follow-up in 1 week- as scheduled 07/31/24  Pls call/ message for questions,  Shemar Plemmons Mckinney Lennis Korb, RN, BSN, CCRN Alumnus RN Care Manager  Transitions of Care  [facility] - [facility] [phone]: direct office     "

## 2024-07-22 NOTE — Patient Instructions (Signed)
 Visit Information  Thank you for taking time to visit with me today. Please don't hesitate to contact me if I can be of assistance to you before our next scheduled telephone appointment.  Our next appointment is by telephone on Friday, July 31, 2024 at 11:00 am  Please call the care guide team at [phone] if you need to cancel or reschedule your appointment.   Following are the goals we discussed today:  Patient Self Care Activities:  Attend all scheduled provider appointments Call provider office for new concerns or questions  Participate in Transition of Care Program/Attend TOC scheduled calls Take medications as prescribed   Follow fall precautions: clear pathways, wear non skid footwear, keep areas such as hallways/ bathrooms well lit. Use ambulatory device as recommended.  Consider eating potassium rich foods: bananas, oranges, potatoes, spinach Call 911 for severe SOB/ chest pain Continue working with the home health team that is involved in your care Take all recorded blood pressures from home monitoring to your doctor appointments for review If you believe your condition is getting worse- contact your care providers (doctors) promptly- reaching out to your doctor early when you have concerns can prevent you from having to go to the hospital  If you are experiencing a Mental Health or Behavioral Health Crisis or need someone to talk to, please  call the Suicide and Crisis Lifeline: 988 call the [place]  National Suicide Prevention Lifeline: 9394403581 or TTY: (719) 833-4221 TTY 740 442 6993) to talk to a trained counselor call 1-800-273-TALK (toll free, 24 hour hotline) go to [facility] [address] [place] [phone]) call the [facility] Crisis Line: 906-656-0964 call 911   Care plan and visit instructions communicated with the patient verbally today. Patient agrees to receive a copy in MyChart. Active MyChart status and  patient understanding of how to access instructions and care plan via MyChart confirmed with patient.     Ruhama Lehew Mckinney Seung Nidiffer, RN, BSN, Media Planner  Transitions of Care  [facility] - [facility] [phone]: direct office

## 2024-07-24 ENCOUNTER — Other Ambulatory Visit

## 2024-07-24 ENCOUNTER — Inpatient Hospital Stay: Admission: RE | Admit: 2024-07-24

## 2024-07-24 ENCOUNTER — Telehealth: Payer: Self-pay

## 2024-07-24 NOTE — Telephone Encounter (Signed)
 Copied from CRM #[id number]. Topic: Clinical - Medication Question >> Jul 22, 2024  2:08 PM Burnard DEL wrote: Reason for CRM: Renda from [facility] called to find out how patient is suppose to be taking potassium pills? She said that patient was unsure why she has to take the potassium for 5 more days now because patient believes that she should be taking the 10 mg twice a day?Please advise

## 2024-07-24 NOTE — Telephone Encounter (Signed)
 Take one potassium pill each day for 5 days only.  thanks

## 2024-07-24 NOTE — Telephone Encounter (Signed)
 Called and let Pt know

## 2024-07-28 ENCOUNTER — Ambulatory Visit (HOSPITAL_COMMUNITY)
Admission: RE | Admit: 2024-07-28 | Discharge: 2024-07-28 | Disposition: A | Source: Ambulatory Visit | Attending: General Surgery | Admitting: General Surgery

## 2024-07-28 ENCOUNTER — Ambulatory Visit (HOSPITAL_COMMUNITY)

## 2024-07-28 ENCOUNTER — Ambulatory Visit (HOSPITAL_COMMUNITY)
Admission: RE | Admit: 2024-07-28 | Discharge: 2024-07-28 | Disposition: A | Discharge status: Home / Self Care | Source: Ambulatory Visit | Attending: General Surgery | Admitting: General Surgery

## 2024-07-28 ENCOUNTER — Encounter (HOSPITAL_COMMUNITY): Payer: Self-pay | Admitting: Radiology

## 2024-07-28 ENCOUNTER — Encounter (HOSPITAL_COMMUNITY): Payer: Self-pay

## 2024-07-28 DIAGNOSIS — K651 Peritoneal abscess: Secondary | ICD-10-CM

## 2024-07-28 DIAGNOSIS — Z4803 Encounter for change or removal of drains: Secondary | ICD-10-CM | POA: Insufficient documentation

## 2024-07-28 DIAGNOSIS — Y839 Surgical procedure, unspecified as the cause of abnormal reaction of the patient, or of later complication, without mention of misadventure at the time of the procedure: Secondary | ICD-10-CM | POA: Insufficient documentation

## 2024-07-28 DIAGNOSIS — T8143XA Infection following a procedure, organ and space surgical site, initial encounter: Secondary | ICD-10-CM | POA: Diagnosis not present

## 2024-07-28 HISTORY — PX: IR SINUS/FIST TUBE CHK-NON GI: IMG673

## 2024-07-28 MED ORDER — IOHEXOL 300 MG/ML  SOLN
100.0000 mL | Freq: Once | INTRAMUSCULAR | Status: AC | PRN
  Administered 2024-07-28: 100 mL via INTRAVENOUS

## 2024-07-28 MED ORDER — IOHEXOL 300 MG/ML  SOLN
5.0000 mL | Freq: Once | INTRAMUSCULAR | Status: AC | PRN
  Administered 2024-07-28: 5 mL

## 2024-07-28 NOTE — Telephone Encounter (Signed)
-----   Message from Wilbert Bihari, MD sent at 07/22/2024  9:30 PM EST ----- Heart monitor showed normal rhythm with 7 episodes of SVT lasting as long as 10.1 seconds.  Extra heart beats from top and bottom of heart.  Please find out if she is still bothered by  palpitations>>may be due to low Mag and K+

## 2024-07-28 NOTE — Progress Notes (Signed)
 Patient ID: Brooke [name], female   DOB: August 25, 1950, 74 y.o.   MRN: [id number] 74 y.o. female with past medical history significant for anxiety/depression, aortic atherosclerosis, asthma, chronic kidney disease, eczema, anemia, GERD, gout, colon polyps, hyperlipidemia, hypertension, hypothyroidism, osteoarthritis, PVCs, thyroid  goiter, diabetes who was recently diagnosed with stage II adenocarcinoma of the right colon, status post right colectomy on 06/12/24.  Patient was discharged home on 11/30 and reported to the surgical office recently for staple removal where hematoma was drained and packed at the midline.  Subsequent CT scan revealed a 7 x 7 cm right upper quadrant abscess posterior to the bowel anastomosis.  No leak of contrast was seen; s/p drainage of RUQ abscess vs hematoma 12/5 (14 fr to JP ). Discharged home on 12/8. Drain fluid cx with enterococcus. Presented today for f/u CT/drain injection. Pt reports minimal drain output, no fevers. CT A/P today revealed:  Resolution of the right upper quadrant abscess. Minimal right upper quadrant soft tissue stranding more consistent with postoperative healing and edema. Small volume of fluid along the lateral aspect of the liver likely representing old hematoma.  Drain injection today revealed no evidence of bowel fistula. Drain removed in its entirety without immediate complications.   Dr. Hughes reviewed all imaging

## 2024-07-28 NOTE — Telephone Encounter (Signed)
 Call to patient to patient to discuss heart monitor results, no answer. No updated DPR on file for our practice. Left VM asking recipient to call [place] at our office #. [facility] also sent.

## 2024-07-28 NOTE — Telephone Encounter (Signed)
 Called patient to advise that Heart monitor showed normal rhythm with 7 episodes of SVT lasting as long as 10.1 seconds.  Patient verbalizes understanding of extra heart beats from top and bottom of heart.  Patient reports she has not had many problems with palpitations, but states she is very concerned about chronically low potassium.  She states she has had several hospital stays in the past few months due to GI problems and has had problems with low potassium in particular. Her most recent hospital stay was 07/10/24-07/11/24. Discharge notes from that visit state:   recent diagnosis of adenocarcinoma of right colon status post right colectomy on June 12, 2024 with recent diagnosis of intra-abdominal abscess requiring admission from 06/25/2024-06/29/2024 requiring IV followed by oral antibiotics and drain placement by IR and subsequently discharged home with indwelling drain presented to cancer center for blood work which showed potassium of 2.4.  She was referred to the ED. magnesium  was 1.4 which was replaced.  General surgery recommended outpatient follow-up with general surgery.  Subsequently, potassium is 3.2 today which is being replaced.   Notes from PCP on 07/21/24 state  Brooke [name] is a 74 y.o. female here overall doing ok, except has recurring severe pain with the drain that required replacement recently.  Plan is to continue drain for 1 mo, pt asking for refill percocet 7.5 325.   Recently hospd with low K likely diuretic related, and low Magnesium    Edema has not returned post d/c  Pt holding diuretics but is finishing her short course of potassium.    Patient is very concerned with potassium replacement and asks if she should restart due to findings of SVT on monitor. Forwarded to Dr. Shlomo for advice.

## 2024-07-30 ENCOUNTER — Ambulatory Visit: Admitting: Allergy

## 2024-07-31 ENCOUNTER — Telehealth: Payer: Self-pay

## 2024-07-31 ENCOUNTER — Other Ambulatory Visit: Payer: Self-pay | Admitting: *Deleted

## 2024-07-31 ENCOUNTER — Encounter: Payer: Self-pay | Admitting: *Deleted

## 2024-07-31 DIAGNOSIS — T8143XA Infection following a procedure, organ and space surgical site, initial encounter: Secondary | ICD-10-CM

## 2024-07-31 DIAGNOSIS — K56609 Unspecified intestinal obstruction, unspecified as to partial versus complete obstruction: Secondary | ICD-10-CM

## 2024-07-31 DIAGNOSIS — J449 Chronic obstructive pulmonary disease, unspecified: Secondary | ICD-10-CM

## 2024-07-31 DIAGNOSIS — K389 Disease of appendix, unspecified: Secondary | ICD-10-CM

## 2024-07-31 DIAGNOSIS — C182 Malignant neoplasm of ascending colon: Secondary | ICD-10-CM

## 2024-07-31 DIAGNOSIS — N1832 Chronic kidney disease, stage 3b: Secondary | ICD-10-CM

## 2024-07-31 NOTE — Transitions of Care (Post Inpatient/ED Visit) (Signed)
 " Transition of Care week # 5/ day # 30 TOC 30-day program case closure  Visit Note  07/31/2024  Name: Brooke [name] MRN: [id number]          DOB: [birth year]  Situation: Patient enrolled in [facility] 30-day program. Visit completed with patient by telephone.   HIPAA identifiers x 2 verified  Background:  Recent hospitalization October 21-26, 2025 for hypokalemia and hypomagnesemia; weakness Hospital re-admission November 16-30, 2025: colon CA with (R) surgical colectomy/ post-op anemia Hospital re-admission December 4-8, 2025: sent to hospital from surgical provider office: post op abdominal abscess with drain placement Observation hospital visit December 19-20: for ongoing/ recurrent sever hypokalemia: confirmed diuretic therapy discontinued during hospitalization Essentially independent; resides with supportive adult son and daughter-in-law; uses [employer] at baseline for transportation- well established with [employer]; family assists with transportation as indicated; currently using walker for ambulation (did not use regularly prior to recent hospital admission) Fragile state of health, multiple progressing chronic health conditions (6) unplanned hospital admissions x last (6)/ (12) months- with several observeration status admissions and ED visits  07/31/24: TOC 30--day outreach completed; patient has successfully met/ accomplished previously established goals for [facility] 30-day program without hospital readmission; referral was sent for ongoing follow up with longitudinal RN CM   Initial Transition Care Management Follow-up Telephone Call Discharge Date and Diagnosis: 06/29/24, Post-op abscess with drain placement   Past Medical History:  Diagnosis Date   Alopecia    Anxiety    Aortic atherosclerosis    Asthma    FOLLOWED BY PCP   Cancer of ascending colon [id number] s/p right colectomy 06/12/2024 06/19/2024   Chronic kidney disease, stage 3a (HCC) 06/25/2024   Eczema    GERD (gastroesophageal reflux  disease)    Gout    04-28-2018--- per pt stable , as been a while since last episode   Heart murmur    History of colon polyps    History of syncope 2015   Hyperlipidemia    Hypertension    Hypothyroidism    OA (osteoarthritis) of knee    bilateral   PAT (paroxysmal atrial tachycardia)    Occurred in the setting of ileus and electrolyte abnormalities   PVC's (premature ventricular contractions)    Toxic multinodular goiter    03/ 2003  s/p RAI   Type 2 diabetes mellitus (HCC)    followed by pcp   Ventricular tachycardia, polymorphic (HCC) 01/04/2014   primary cardiologist-- dr wilbert turner (hx monitor 2015 showed couplet PVCs, as trigger)   Wears glasses    Wears partial dentures    upper   Assessment:  I am doing so much better and feeling more like myself now- they took the drain out on 07/28/24 and things seem better now that it is gone- go back to the surgeon for my next visit on 08/20/24; the home health nurse and PT is still coming and they say I am doing well- no more falls, and the incision is healing good- we are still changing the dressings every day at home, and there have been no more problems.  Took the potassium and the magnesium  like I was supposed to.  I had a little diarrhea yesterday, but it is better today; my appetite is also much better    Patient denies clinical concerns and sounds to be in no distress throughout [facility] 30-day program outreach call   Patient Reported Symptoms: Cognitive Cognitive Status: Normal speech and language skills, Alert and oriented to person, place, and time,  Insightful and able to interpret abstract concepts Cognitive/Intellectual Conditions Management [RPT]: None reported or documented in medical history or problem list      Neurological Neurological Review of Symptoms: No symptoms reported    HEENT HEENT Symptoms Reported: No symptoms reported      Cardiovascular Cardiovascular Symptoms Reported: No symptoms reported Does patient  have uncontrolled Hypertension?: No Cardiovascular Management Strategies: Medication therapy, Routine screening, Coping strategies, Adequate rest  Respiratory Respiratory Symptoms Reported: No symptoms reported Other Respiratory Symptoms: Denies shortness of breath and sounds to be in no respiratory distress throughout [facility] call Respiratory Management Strategies: Asthma action plan, Adequate rest, Coping strategies, Routine screening, Medication therapy  Endocrine Endocrine Symptoms Reported: No symptoms reported Is patient diabetic?: Yes Is patient checking blood sugars at home?: Yes List most recent blood sugar readings, include date and time of day: Blood sugars still look just fine- no high or low numbers at home    Gastrointestinal Gastrointestinal Symptoms Reported: Diarrhea Other Gastrointestinal Symptoms: Reports had diarrhea for 2 days but didn't feel bad with it; it is no longer a problem now; confirms abdominal JP drain removed on 07/28/24; reports improved appetite, eating good Gastrointestinal Management Strategies: Adequate rest, Coping strategies    Genitourinary Genitourinary Symptoms Reported: No symptoms reported    Integumentary Integumentary Symptoms Reported: Incision Other Integumentary Symptoms: My incision looks good- it is healing up good- no redness and no drainage; we still change the dressing every day on our own, and the nurse from the home health is still coming out too- she says it looks real good and she has no concerns.  When the drain was removed on 07/28/24- toe doctor said the incision from the surgery looks real good and they had no concerns Skin Management Strategies: Routine screening, Dressing changes, Coping strategies  Musculoskeletal Musculoskelatal Symptoms Reviewed: Limited mobility, Unsteady gait Additional Musculoskeletal Details: confirmed currently requiring/ using assistive devices for ambulation - walker; confirmed with patient no additional  falls since last fall at PCP office visit, without injury; confirmed home health PT remains active Musculoskeletal Management Strategies: Routine screening, Coping strategies, Medical device   Fall risk Follow up: Falls prevention discussed  Psychosocial Psychosocial Symptoms Reported: No symptoms reported Behavioral Management Strategies: Support system, Coping strategies, Adequate rest Major Change/Loss/Stressor/Fears (CP): Denies Techniques to Cope with Loss/Stress/Change: Diversional activities Quality of Family Relationships: supportive, involved, helpful   There were no vitals filed for this visit. Pain Scale: 0-10 Pain Score: 0-No pain  Medications Reviewed Today     Reviewed by Cordon Gassett M, RN (Registered Nurse) on 07/31/24 at 1315  Med List Status: <None>   Medication Order Taking? Sig Documenting Provider Last Dose Status Informant  albuterol  (VENTOLIN  HFA) 108 (90 Base) MCG/ACT inhaler [id number]  Inhale 2 puffs into the lungs every 4 (four) hours as needed for wheezing or shortness of breath. Jeneal Danita Macintosh, MD  Active Pharmacy Records, Child  allopurinol  (ZYLOPRIM ) 100 MG tablet [id number]  Take 1 tablet (100 mg total) by mouth daily. Norleen Lynwood ORN, MD  Active   atropine  1 % ophthalmic solution [id number]  Place 1 drop into the left eye daily. [provider]  Active Pharmacy Records, Child           Med Note (CRUTHIS, CHLOE C   Sat Jun 27, 2024  2:00 PM) Not found at the pt's home by son and daughter. Unsure if the pt is taking this at home.   Azelastine  HCl 137 MCG/SPRAY SOLN [id number]  Place into  both nostrils. [provider]  Active   brimonidine  (ALPHAGAN ) 0.2 % ophthalmic solution [id number]  3 (three) times daily. 1 drop in the left eye three times daily [provider]  Active Pharmacy Records, Child           Med Note LORNE, CHLOE C   Sat Jun 27, 2024  2:01 PM) Not found at the pt's home by son and daughter. Unsure if the pt is  taking this at home.   citalopram  (CELEXA ) 40 MG tablet [id number]  Take 1 tablet (40 mg total) by mouth daily. Norleen Lynwood ORN, MD  Active   colchicine  0.6 MG tablet [id number]  TAKE 0.5 TABLETS (0.3 MG TOTAL) BY MOUTH DAILY AS NEEDED (GOUT OR PSUEDOGOUT PAIN).  Patient taking differently: Take 0.3 mg by mouth daily as needed (gout pain).   Corey, Evan S, MD  Active Pharmacy Records, Child  diltiazem  (CARDIZEM  CD) 180 MG 24 hr capsule [id number]  Take 1 capsule (180 mg total) by mouth daily. Shlomo Wilbert SAUNDERS, MD  Active Pharmacy Records, Child  diltiazem  (CARDIZEM  CD) 240 MG 24 hr capsule [id number]  Take 240 mg by mouth daily. [provider]  Active   empagliflozin  (JARDIANCE ) 25 MG TABS tablet [id number]  Take 1 tablet (25 mg total) by mouth daily. Norleen Lynwood ORN, MD  Active   erythromycin  ophthalmic ointment [id number]  Place 1 Application into the left eye at bedtime. [provider]  Active Pharmacy Records, Child  famotidine  (PEPCID ) 20 MG tablet [id number]  Take 1 tablet (20 mg total) by mouth 2 (two) times daily. Jeneal Danita Macintosh, MD  Active Pharmacy Records, Child           Med Note LORNE, SHEFFIELD BROCKS   Dju Jun 27, 2024  2:01 PM) Not found at the pt's home by son and daughter. Unsure if the pt is taking this at home.   Fluticasone -Umeclidin-Vilant (TRELEGY ELLIPTA ) 200-62.5-25 MCG/ACT AEPB [id number]  Inhale 1 puff into the lungs daily. Jeneal Danita Macintosh, MD  Active Pharmacy Records, Child           Med Note LORNE, SHEFFIELD BROCKS   Dju Jun 27, 2024  2:01 PM) Not found at the pt's home by son and daughter. Unsure if the pt is taking this at home.   latanoprost  (XALATAN ) 0.005 % ophthalmic solution [id number]  Place 1 drop into the left eye at bedtime. [provider]  Active Pharmacy Records, Child           Med Note LORNE, CHLOE C   Sat Jun 27, 2024  2:02 PM) Not found at the pt's home by son and daughter. Unsure if the pt is taking this at home.    levocetirizine (XYZAL ) 5 MG tablet [id number]  Take 1 tablet (5 mg total) by mouth daily. Jeneal Danita Macintosh, MD  Active Pharmacy Records, Child           Med Note LORNE, SHEFFIELD BROCKS   Dju Jun 27, 2024  2:02 PM) Not found at the pt's home by son and daughter. Unsure if the pt is taking this at home.   Magnesium  400 MG TABS [id number]  1 tab by mouth once daily for 5 days Norleen Lynwood ORN, MD  Active   meclizine  (ANTIVERT ) 25 MG tablet [id number]  TAKE 1 TABLET BY MOUTH 3 TIMES A DAY AS NEEDED FOR DIZZINESS Norleen Lynwood ORN, MD  Active Pharmacy Records, Child  Med Note (CRUTHIS, CHLOE C   Sat Jun 27, 2024  2:02 PM) Not found at the pt's home by son and daughter. Unsure if the pt is taking this at home.   metoprolol  succinate (TOPROL -XL) 100 MG 24 hr tablet [id number]  Take 1 tablet (100 mg total) by mouth daily. Take with or immediately following a meal.  Patient taking differently: Take 100 mg by mouth in the morning and at bedtime.   Cherlyn Labella, MD  Active Pharmacy Records, Child  ondansetron  (ZOFRAN -ODT) 4 MG disintegrating tablet [id number]  Take 1 tablet (4 mg total) by mouth every 8 (eight) hours as needed for nausea or vomiting. Odell Celinda Balo, MD  Active   oxyCODONE -acetaminophen  (PERCOCET) 7.5-325 MG tablet [id number]  Take 1 tablet by mouth every 4 (four) hours as needed for severe pain (pain score 7-10). Norleen Lynwood ORN, MD  Active   pantoprazole  (PROTONIX ) 40 MG tablet [id number]  Take 1 tablet (40 mg total) by mouth daily. Norleen Lynwood ORN, MD  Active   rosuvastatin  (CRESTOR ) 40 MG tablet [id number]  TAKE 1 TABLET BY MOUTH EVERY DAY Shlomo Wilbert SAUNDERS, MD  Active Pharmacy Records, Child  traZODone  (DESYREL ) 100 MG tablet [id number]  Take 1 tablet (100 mg total) by mouth at bedtime as needed. for sleep Norleen Lynwood ORN, MD  Active            Recommendation:   Specialty provider follow-up: surgical provider 08/20/24 as scheduled Referral to: [facility] longitudinal RN CM- within 14 days of  TOC 30-day program case closure today Continue Current Plan of Care  Follow Up Plan:   Referral to RN Case Manager Closing From:  Transitions of Care Program  I appreciate the opportunity to participate in Sowmya's care,  Pls call/ message for questions,  Sumiko Ceasar Mckinney Tamryn Popko, RN, BSN, CCRN Alumnus RN Care Manager  Transitions of Care  [facility] - [facility] [phone]: direct office     "

## 2024-07-31 NOTE — Telephone Encounter (Signed)
 Copied from CRM #[id number]. Topic: General - Other >> Jul 31, 2024 11:06 AM Emylou G wrote: Reason for CRM: Patient .Brooke [name] Tried to return Laine call.Brooke [name] Pls fwd 11:00 AM - [facility] Telephone Call [place] [facility] - Laine M Tousey, RN

## 2024-07-31 NOTE — Telephone Encounter (Signed)
-----   Message from Wilbert Bihari, MD sent at 07/28/2024  2:37 PM EST ----- No sure why K+ and Mag would still be low since she is not on diuretics.  She needs to follow what the surgeon and her PCP tell her regarding her potassium and Mag supplementation. ----- Message ----- From: Brooke Geni CROME, RN Sent: 07/28/2024   2:33 PM EST To: Wilbert JONELLE Bihari, MD  ----- Message from Geni [name] Janit, RN sent at 07/28/2024  2:33 PM EST -----  See patient responses ----- Message ----- From: Bihari Wilbert JONELLE, MD Sent: 07/22/2024   9:30 PM EST To: Lynwood LELON Rush, MD; Geni [name] Janit, RN  Heart monitor showed normal rhythm with 7 episodes of SVT lasting as long as 10.1 seconds.  Extra heart beats from top and bottom of heart.  Please find out if she is still bothered by  palpitations>>may be due to low Mag and K+

## 2024-07-31 NOTE — Telephone Encounter (Signed)
 Call to patient to advise that  Dr. Shlomo is not sure why K+ and Mag would still be low since she is not on diuretics.  Explained she needs to follow what the surgeon and her PCP tell her regarding her potassium and Mag supplementation. Patient verbalizes understanding and agrees to plan.

## 2024-07-31 NOTE — Patient Instructions (Signed)
 Visit Information  Thank you for taking time to visit with me today. Please don't hesitate to contact me if I can be of assistance to you before our next scheduled telephone appointment.  It has been a pleasure working with you over the last 30 days!  I am glad that you are doing well!  Please listen for a call from the scheduling care guide to schedule a phone call with the new nurse care manager who will pick up in your care where we are leaving off today  Following are the goals we discussed today:  Patient Self Care Activities:  Attend all scheduled provider appointments Call provider office for new concerns or questions  Take medications as prescribed   Follow fall precautions: clear pathways, wear non skid footwear, keep areas such as hallways/ bathrooms well lit. Use ambulatory device as recommended.  Consider eating potassium rich foods: bananas, oranges, potatoes, spinach Continue working with the home health team that is involved in your care Take all recorded blood pressures and blood sugars from home monitoring to your doctor appointments for review If you believe your condition is getting worse- contact your care providers (doctors) promptly- reaching out to your doctor early when you have concerns can prevent you from having to go to the hospital  If you are experiencing a Mental Health or Behavioral Health Crisis or need someone to talk to, please  call the Suicide and Crisis Lifeline: 988 call the [place]  National Suicide Prevention Lifeline: 980-234-7376 or TTY: (845)305-5965 TTY 825-887-0051) to talk to a trained counselor call 1-800-273-TALK (toll free, 24 hour hotline) go to [facility] [address] [place] [phone]) call the [facility] Crisis Line: [phone] call 911   Care plan and visit instructions communicated with the patient verbally today. Patient agrees to receive a copy in MyChart. Active MyChart status and  patient understanding of how to access instructions and care plan via MyChart confirmed with patient.     Pls call/ message for questions,  Kasaundra Fahrney Mckinney Kris No, RN, BSN, CCRN Alumnus RN Care Manager  Transitions of Care  [facility] - [facility] [phone]: direct office

## 2024-08-04 ENCOUNTER — Telehealth: Payer: Self-pay

## 2024-08-04 NOTE — Telephone Encounter (Signed)
 Copied from CRM [id number]. Topic: Clinical - Home Health Verbal Orders >> Aug 04, 2024  2:00 PM Suzen RAMAN wrote: Caller/Agency: [facility] Callback Number: [phone]  Service Requested: Skilled Nursing Frequency:  decrease nurse visit once a  week for 4 week; patient incision has healed properly  Any new concerns about the patient? No

## 2024-08-05 NOTE — Telephone Encounter (Signed)
 Ok for [employer]

## 2024-08-06 LAB — SIGNATERA
SIGNATERA MTM READOUT: 0 MTM/ml
SIGNATERA TEST RESULT: NEGATIVE

## 2024-08-06 NOTE — Telephone Encounter (Signed)
 Called and left voicemail giving verbals.

## 2024-08-07 ENCOUNTER — Encounter (HOSPITAL_COMMUNITY): Payer: Self-pay

## 2024-08-10 ENCOUNTER — Telehealth: Payer: Self-pay

## 2024-08-10 NOTE — Telephone Encounter (Signed)
 Copied from CRM #[id number]. Topic: Clinical - Home Health Verbal Orders >> Aug 10, 2024  2:34 PM Mia F wrote: Caller/Agency: [facility]- Marko Rushing Number: [phone] Service Requested: Physical Therapy Frequency: 2 week 3; 1 week 1  Any new concerns about the patient? No

## 2024-08-10 NOTE — Telephone Encounter (Signed)
 Ok for [employer]

## 2024-08-12 NOTE — Telephone Encounter (Signed)
 Called and left voicemail giving verbals.

## 2024-08-13 ENCOUNTER — Other Ambulatory Visit: Payer: Self-pay

## 2024-08-13 ENCOUNTER — Telehealth: Payer: Self-pay

## 2024-08-13 NOTE — Patient Outreach (Signed)
 Complex Care Management   Visit Note  08/13/2024  Name:  Brooke [name] MRN: [id number] DOB: 1951/06/14  Situation: Referral received from [facility] for Complex Care Management related to for ongoing follow up I obtained verbal consent from Patient.  Visit completed with Patient  on the phone  Background:   Past Medical History:  Diagnosis Date   Alopecia    Anxiety    Aortic atherosclerosis    Asthma    FOLLOWED BY PCP   Cancer of ascending colon pT3pN0 s/p right colectomy 06/12/2024 06/19/2024   Chronic kidney disease, stage 3a (HCC) 06/25/2024   Eczema    GERD (gastroesophageal reflux disease)    Gout    04-28-2018--- per pt stable , as been a while since last episode   Heart murmur    History of colon polyps    History of syncope 2015   Hyperlipidemia    Hypertension    Hypothyroidism    OA (osteoarthritis) of knee    bilateral   PAT (paroxysmal atrial tachycardia)    Occurred in the setting of ileus and electrolyte abnormalities   PVC's (premature ventricular contractions)    Toxic multinodular goiter    03/ 2003  s/p RAI   Type 2 diabetes mellitus (HCC)    followed by pcp   Ventricular tachycardia, polymorphic (HCC) 01/04/2014   primary cardiologist-- dr wilbert turner (hx monitor 2015 showed couplet PVCs, as trigger)   Wears glasses    Wears partial dentures    upper    Assessment:  Per TOC note: Recent hospitalization October 21-26, 2025 for hypokalemia and hypomagnesemia; weakness Re-admission November 16-30, 2025: colon CA with (R) surgical colectomy/ post-op anemia Re-admission December 4-8, 2025: sent to hospital from surgical provider office: post op abdominal abscess with drain placement Observation hospital visit December 19-20: for ongoing/ recurrent severe hypokalemia:   08/13/24 Per patient her surgical incision area has healed well. She denies any issues with surgery. However, reports increased BP since the beginning of January:  159/81 HR 99  08/12/24 154/87 HR 99 08/11/24 151/83 HR 99 08/10/24 145/80 HR 99 08/09/24 149/81 HR 98 08/08/24 159/82 HR 92 08/07/24 RNCM completed 3 way call to PCP office to notify. Patient awaiting return call. Patient reports she was not aware that PCP is retiring. RNCM completed 3 way call to PCP office to schedule with new provider (current PCP is retiring). Patient Reported Symptoms:  Cognitive Cognitive Status: No symptoms reported, Alert and oriented to person, place, and time Cognitive/Intellectual Conditions Management [RPT]: None reported or documented in medical history or problem list   Health Maintenance Behaviors: Annual physical exam Healing Pattern: Fast Health Facilitated by: Healthy diet  Neurological Neurological Review of Symptoms: No symptoms reported    HEENT HEENT Symptoms Reported: No symptoms reported      Cardiovascular Cardiovascular Symptoms Reported: No symptoms reported Does patient have uncontrolled Hypertension?: Yes Is patient checking Blood Pressure at home?: Yes Patient's Recent BP reading at home: 159/83 HR 98 Cardiovascular Management Strategies: Medication therapy, Routine screening, Adequate rest, Activity Weight: 168 lb (76.2 kg) (per patient checked 08/12/24) Cardiovascular Self-Management Outcome: 4 (good)  Respiratory Respiratory Symptoms Reported: No symptoms reported Other Respiratory Symptoms: patient denies any SOB, denies any asthma flares.    Endocrine Endocrine Symptoms Reported: No symptoms reported Is patient diabetic?: Yes Is patient checking blood sugars at home?: Yes (reports checkes BS once a day.) List most recent blood sugar readings, include date and time of day: BS 88 today. denies  any low blood sugars.    Gastrointestinal Gastrointestinal Symptoms Reported: No symptoms reported Other Gastrointestinal Symptoms: patient denies diarrhea and reports no problems with bowels. last BM this morning was normal.      Genitourinary Genitourinary  Symptoms Reported: No symptoms reported    Integumentary Integumentary Symptoms Reported: No symptoms reported Other Integumentary Symptoms: Patient reports incision healed at this time and denies any problems with skin.    Musculoskeletal Musculoskelatal Symptoms Reviewed: Unsteady gait Additional Musculoskeletal Details: patient reports uses walker to get around and continues with home health Physical therapy. Musculoskeletal Management Strategies: Medication therapy, Routine screening, Adequate rest, Medical device Musculoskeletal Self-Management Outcome: 5 (very good) Falls in the past year?: Yes (no falls since December 2025.) Number of falls in past year: 1 or less Was there an injury with Fall?: No Fall Risk Category Calculator: 1 Patient Fall Risk Level: Low Fall Risk Patient at Risk for Falls Due to: History of fall(s)  Psychosocial Psychosocial Symptoms Reported: No symptoms reported     Quality of Family Relationships: supportive, involved Do you feel physically threatened by others?: No    08/13/2024    PHQ2-9 Depression Screening   Little interest or pleasure in doing things Not at all  Feeling down, depressed, or hopeless Not at all  PHQ-2 - Total Score 0  Trouble falling or staying asleep, or sleeping too much    Feeling tired or having little energy    Poor appetite or overeating     Feeling bad about yourself - or that you are a failure or have let yourself or your family down    Trouble concentrating on things, such as reading the newspaper or watching television    Moving or speaking so slowly that other people could have noticed.  Or the opposite - being so fidgety or restless that you have been moving around a lot more than usual    Thoughts that you would be better off dead, or hurting yourself in some way    PHQ2-9 Total Score    If you checked off any problems, how difficult have these problems made it for you to do your work, take care of things at home, or  get along with other people    Depression Interventions/Treatment      Today's Vitals   08/13/24 1142  BP: (!) 159/83  Pulse: 98  Weight: 168 lb (76.2 kg)   Pain Score: 0-No pain  Medications Reviewed Today     Reviewed by Paeton Studer M, RN (Registered Nurse) on 08/13/24 at 1129  Med List Status: <None>   Medication Order Taking? Sig Documenting Provider Last Dose Status Informant  albuterol  (VENTOLIN  HFA) 108 (90 Base) MCG/ACT inhaler [id number] Yes Inhale 2 puffs into the lungs every 4 (four) hours as needed for wheezing or shortness of breath. Jeneal Danita Macintosh, MD  Active Pharmacy Records, Child  allopurinol  (ZYLOPRIM ) 100 MG tablet [id number] Yes Take 1 tablet (100 mg total) by mouth daily. Norleen Lynwood ORN, MD  Active   atropine  1 % ophthalmic solution [id number] Yes Place 1 drop into the left eye daily. [provider]  Active Pharmacy Records, Child           Med Note KARLYNN, HEDDY CHRISTELLA Schaumann Aug 13, 2024 11:23 AM)    Azelastine  HCl 137 MCG/SPRAY SOLN [id number] Yes Place into both nostrils. [provider]  Active   brimonidine  (ALPHAGAN ) 0.2 % ophthalmic solution [id number] Yes 3 (three) times daily. 1 drop in  the left eye three times daily [provider]  Active Pharmacy Records, Child           Med Note KARLYNN, HEDDY CHRISTELLA Schaumann Aug 13, 2024 11:23 AM)    citalopram  (CELEXA ) 40 MG tablet [id number] Yes Take 1 tablet (40 mg total) by mouth daily. Norleen Lynwood ORN, MD  Active   colchicine  0.6 MG tablet [id number] Yes TAKE 0.5 TABLETS (0.3 MG TOTAL) BY MOUTH DAILY AS NEEDED (GOUT OR PSUEDOGOUT PAIN). Corey, Evan S, MD  Active Pharmacy Records, Child  diltiazem  (CARDIZEM  CD) 180 MG 24 hr capsule [id number] Yes Take 1 capsule (180 mg total) by mouth daily. Shlomo Wilbert SAUNDERS, MD  Active Pharmacy Records, Child  diltiazem  (CARDIZEM  CD) 240 MG 24 hr capsule [id number]  Take 240 mg by mouth daily.  Patient not taking: Reported on 08/13/2024   [provider]  Active   empagliflozin  (JARDIANCE ) 25 MG TABS tablet [id number] Yes Take 1 tablet (25 mg total) by mouth daily. Norleen Lynwood ORN, MD  Active   erythromycin  ophthalmic ointment [id number] Yes Place 1 Application into the left eye at bedtime. [provider]  Active Pharmacy Records, Child  famotidine  (PEPCID ) 20 MG tablet [id number] Yes Take 1 tablet (20 mg total) by mouth 2 (two) times daily. Jeneal Danita Macintosh, MD  Active Pharmacy Records, Child           Med Note KARLYNN, HEDDY CHRISTELLA Schaumann Aug 13, 2024 11:25 AM)    Fluticasone -Umeclidin-Vilant (TRELEGY ELLIPTA ) 200-62.5-25 MCG/ACT AEPB [id number] Yes Inhale 1 puff into the lungs daily. Jeneal Danita Macintosh, MD  Active Pharmacy Records, Child           Med Note LORNE, SHEFFIELD BROCKS   Dju Jun 27, 2024  2:01 PM) Not found at the pt's home by son and daughter. Unsure if the pt is taking this at home.   latanoprost  (XALATAN ) 0.005 % ophthalmic solution [id number] Yes Place 1 drop into the left eye at bedtime. [provider]  Active Pharmacy Records, Child           Med Note LORNE, CHLOE C   Sat Jun 27, 2024  2:02 PM) Not found at the pt's home by son and daughter. Unsure if the pt is taking this at home.   levocetirizine (XYZAL ) 5 MG tablet [id number] Yes Take 1 tablet (5 mg total) by mouth daily. Jeneal Danita Macintosh, MD  Active Pharmacy Records, Child           Med Note LORNE, SHEFFIELD BROCKS   Dju Jun 27, 2024  2:02 PM) Not found at the pt's home by son and daughter. Unsure if the pt is taking this at home.   Magnesium  400 MG TABS [id number]  1 tab by mouth once daily for 5 days  Patient not taking: Reported on 08/13/2024   Norleen Lynwood ORN, MD  Active   meclizine  (ANTIVERT ) 25 MG tablet [id number] Yes TAKE 1 TABLET BY MOUTH 3 TIMES A DAY AS NEEDED FOR DIZZINESS Norleen Lynwood ORN, MD  Active Pharmacy Records, Child           Med Note LORNE, SHEFFIELD BROCKS   Dju Jun 27, 2024  2:02 PM) Not found at the pt's home by son and daughter. Unsure if  the pt is taking this at home.   metoprolol  succinate (TOPROL -XL) 100 MG 24 hr tablet [id number] Yes Take 1 tablet (100 mg total) by mouth daily. Take with or immediately following a  meal. Cherlyn Labella, MD  Active Pharmacy Records, Child  ondansetron  (ZOFRAN -ODT) 4 MG disintegrating tablet [id number] Yes Take 1 tablet (4 mg total) by mouth every 8 (eight) hours as needed for nausea or vomiting. Odell Celinda Balo, MD  Active   oxyCODONE -acetaminophen  (PERCOCET) 7.5-325 MG tablet [id number] Yes Take 1 tablet by mouth every 4 (four) hours as needed for severe pain (pain score 7-10). Norleen Lynwood ORN, MD  Active   pantoprazole  (PROTONIX ) 40 MG tablet [id number] Yes Take 1 tablet (40 mg total) by mouth daily. Norleen Lynwood ORN, MD  Active   rosuvastatin  (CRESTOR ) 40 MG tablet [id number] Yes TAKE 1 TABLET BY MOUTH EVERY DAY Shlomo Wilbert SAUNDERS, MD  Active Pharmacy Records, Child  traZODone  (DESYREL ) 100 MG tablet [id number] Yes Take 1 tablet (100 mg total) by mouth at bedtime as needed. for sleep Norleen Lynwood ORN, MD  Active           Recommendation:   PCP Follow-up Specialty provider follow-up cardiology visit as scheduled.  Follow Up Plan:   Telephone follow up appointment date/time:  08/27/24 at 2:00 pm  Heddy Shutter, RN, MSN, BSN, CCM [facility], Population Health Case Manager Phone: [phone]

## 2024-08-13 NOTE — Telephone Encounter (Signed)
 It might be ok, but does she know the Heart Rate that is normally done by the machine at home?  We should not increase the dose if her heart rates is already in the 50's.  So just let us  know   thanks

## 2024-08-13 NOTE — Patient Instructions (Signed)
 Visit Information  Thank you for taking time to visit with me today. Please don't hesitate to contact me if I can be of assistance to you before our next scheduled appointment.  Our next appointment is by telephone on 08/27/24 at 2:00 pm Please call the care guide team at [phone] if you need to cancel or reschedule your appointment.   Following is a copy of your care plan:   Goals Addressed             This Visit's Progress    VBCI RN Care Plan       Problems:  Chronic Disease Management support and education needs related to HTN  Goal: Over the next 90 days the Patient will continue to work with RN Care Manager and/or Social Worker to address care management and care coordination needs related to HTN as evidenced by adherence to care management team scheduled appointments     demonstrate Ongoing adherence to prescribed treatment plan for HTN as evidenced by patient report and/or review of chart take all medications exactly as prescribed and will call provider for medication related questions as evidenced by patient report and/or review of chart     Upcoming appointment 09/01/24 Cardiology  Interventions:   Hypertension Interventions: Last practice recorded BP readings:  BP Readings from Last 3 Encounters:  08/13/24 (!) 159/83  07/21/24 122/78  07/11/24 135/60   Most recent eGFR/CrCl:  Lab Results  Component Value Date   EGFR 24 (L) 06/01/2024    No components found for: CRCL  Evaluation of current treatment plan related to hypertension self management and patient's adherence to plan as established by provider Reviewed medications with patient and discussed importance of compliance Discussed plans with patient for ongoing care management follow up and provided patient with direct contact information for care management team Advised patient to discuss any health questions or concerns with provider Screening for signs and symptoms of depression related to chronic  disease state  Assessed social determinant of health barriers RNCM completed 3 way call to PCP office to notify of increased BP trend-Patient awaiting return call. Patient reports she was not aware that PCP is retiring. RNCM completed 3 way call to PCP office to schedule with new provider (current PCP is retiring).  Patient Self-Care Activities:  Attend all scheduled provider appointments Call provider office for new concerns or questions  Take medications as prescribed   write blood pressure results in a log or diary call doctor for signs and symptoms of high blood pressure report new symptoms to your doctor  Plan:  Telephone follow up appointment with care management team member scheduled for:  08/27/24 at 2:00 pm           Please call the Suicide and Crisis Lifeline: 988 call the [place]  National Suicide Prevention Lifeline: 434 784 8586 or TTY: 640-768-1390 TTY (346)533-4393) to talk to a trained counselor if you are experiencing a Mental Health or Behavioral Health Crisis or need someone to talk to.  Patient verbalized understanding of Care plan and visit instructions communicated this visit  Heddy Shutter, RN, MSN, BSN, CCM [place]  [facility], Population Health Case Manager Phone: [phone]

## 2024-08-13 NOTE — Telephone Encounter (Signed)
 Copied from CRM #[id number]. Topic: Clinical - Medication Question >> Aug 13, 2024 12:16 PM Tysheama G wrote: Reason for CRM: Patient wants to know if she can take metoprolol  one in the afternoon and one in the morning . She's also been experiencing insrease blood pressure 159/81 154/87 Callback number [phone] can he or his nurse please call today

## 2024-08-14 ENCOUNTER — Other Ambulatory Visit: Payer: Self-pay | Admitting: Internal Medicine

## 2024-08-14 MED ORDER — HYDRALAZINE HCL 25 MG PO TABS: 25.0000 mg | ORAL_TABLET | Freq: Three times a day (TID) | ORAL | 5 refills | Status: AC

## 2024-08-14 NOTE — Telephone Encounter (Signed)
 Ok the HR is not overly concerning then, but on review of the chart she is already taking metoprolol  xl 100 mg per day, which is a common dose to start with, but Taking 2 per day would not be advised due to the 100 mg dose to start with, and would be higher risk.    I also question the accuracy of the BP monitor, since a diastolic of 149 is rare and would require an Emergency Visit.   Please continue to monitor to verify this now - if this is still true with the very high diastolic (such as anything over 110), pt should go to ED now.    I can add hydralazine  25 mg three times daily instead to reduce the BP and does not affect the Heart Rate. And let us  know by next week about the BPs at that time  thanks

## 2024-08-14 NOTE — Addendum Note (Signed)
 Addended by: NORLEEN LYNWOOD ORN on: 08/14/2024 12:47 PM   Modules accepted: Orders

## 2024-08-18 ENCOUNTER — Telehealth: Payer: Self-pay

## 2024-08-18 MED ORDER — TRAZODONE HCL 100 MG PO TABS: 100.0000 mg | ORAL_TABLET | Freq: Every evening | ORAL | 1 refills | Status: AC | PRN

## 2024-08-18 NOTE — Addendum Note (Signed)
 Addended by: NORLEEN LYNWOOD ORN on: 08/18/2024 04:26 PM   Modules accepted: Orders

## 2024-08-18 NOTE — Telephone Encounter (Signed)
 Copied from CRM [id number]. Topic: Clinical - Medication Refill >> Aug 18, 2024  2:36 PM Kevelyn M wrote: Medication: traZODone  (DESYREL ) 100 MG tablet  Has the patient contacted their pharmacy? Yes (Agent: If no, request that the patient contact the pharmacy for the refill. If patient does not wish to contact the pharmacy document the reason why and proceed with request.) (Agent: If yes, when and what did the pharmacy advise?)  This is the patient's preferred pharmacy:  [id number] - [place], [place] - [address] AT [facility] [address] [place] [address] Phone: [phone] Fax: [phone]  Is this the correct pharmacy for this prescription? Yes If no, delete pharmacy and type the correct one.   Has the prescription been filled recently? No  Is the patient out of the medication? No, 2 pills left  Has the patient been seen for an appointment in the last year OR does the patient have an upcoming appointment? Yes  Can we respond through MyChart? No  Agent: Please be advised that Rx refills may take up to 3 business days. We ask that you follow-up with your pharmacy.

## 2024-08-18 NOTE — Telephone Encounter (Signed)
Ok this is done erx 

## 2024-08-26 ENCOUNTER — Telehealth: Payer: Self-pay | Admitting: Cardiology

## 2024-08-26 ENCOUNTER — Other Ambulatory Visit: Payer: Self-pay

## 2024-08-26 MED ORDER — ROSUVASTATIN CALCIUM 40 MG PO TABS: 40.0000 mg | ORAL_TABLET | Freq: Every day | ORAL | 3 refills | Status: AC

## 2024-08-26 NOTE — Telephone Encounter (Signed)
 Lipid Panel within 12 months 06/01/24

## 2024-08-26 NOTE — Telephone Encounter (Signed)
 Pt states she does not have her copay for 2/10 visit and asking if it can be billed. Please advise.

## 2024-08-27 ENCOUNTER — Other Ambulatory Visit: Payer: Self-pay | Admitting: *Deleted

## 2024-08-27 NOTE — Patient Instructions (Signed)
 Visit Information  Thank you for taking time to visit with me today. Please don't hesitate to contact me if I can be of assistance to you before our next scheduled appointment.  Your next care management appointment is by telephone on 09/10/24 at 1:30 p.m  Please call the care guide team at [phone] if you need to cancel, schedule, or reschedule an appointment.   Please call the Suicide and Crisis Lifeline: 988 call the [place]  National Suicide Prevention Lifeline: 3057692635 or TTY: 202-146-1853 TTY 403-596-5030) to talk to a trained counselor if you are experiencing a Mental Health or Behavioral Health Crisis or need someone to talk to.  Franklin Honour, RN, BSN [place]/ VBCI-Population Health RN care Insurance Underwriter dial: [phone]

## 2024-08-27 NOTE — Patient Outreach (Signed)
 Complex Care Management   Visit Note  08/27/2024  Name:  Brooke [name] MRN: [id number] DOB: 06/22/1951  Situation: Referral received for Complex Care Management related to HTN I obtained verbal consent from Patient.  Visit completed with Patient  on the phone  Background:   Past Medical History:  Diagnosis Date   Alopecia    Anxiety    Aortic atherosclerosis    Asthma    FOLLOWED BY PCP   Cancer of ascending colon [id number] s/p right colectomy 06/12/2024 06/19/2024   Chronic kidney disease, stage 3a (HCC) 06/25/2024   Eczema    GERD (gastroesophageal reflux disease)    Gout    04-28-2018--- per pt stable , as been a while since last episode   Heart murmur    History of colon polyps    History of syncope 2015   Hyperlipidemia    Hypertension    Hypothyroidism    OA (osteoarthritis) of knee    bilateral   PAT (paroxysmal atrial tachycardia)    Occurred in the setting of ileus and electrolyte abnormalities   PVC's (premature ventricular contractions)    Toxic multinodular goiter    03/ 2003  s/p RAI   Type 2 diabetes mellitus (HCC)    followed by pcp   Ventricular tachycardia, polymorphic (HCC) 01/04/2014   primary cardiologist-- dr wilbert turner (hx monitor 2015 showed couplet PVCs, as trigger)   Wears glasses    Wears partial dentures    upper    Assessment: Patient reports currently taking magnesium  250 mg twice/ day and Potasium Chloride 20 Meq twice/day (Per bottle). Per review of chart, telephone note dated 07/24/24, pt was supposed to take Potassium for 5 days only. Also, patient was supposed to take Magnesium  supplement for 5 days as well. Request clarification from PCP.    Patient Reported Symptoms:  Cognitive Cognitive Status: Alert and oriented to person, place, and time      Neurological Neurological Review of Symptoms: No symptoms reported    HEENT HEENT Symptoms Reported: No symptoms reported      Cardiovascular Cardiovascular Symptoms Reported: No symptoms  reported Does patient have uncontrolled Hypertension?: Yes Is patient checking Blood Pressure at home?: Yes Patient's Recent BP reading at home: 149/77 HR-99 Cardiovascular Management Strategies: Activity, Adequate rest, Medication therapy, Routine screening Cardiovascular Self-Management Outcome: 3 (uncertain)  Respiratory Respiratory Symptoms Reported: No symptoms reported    Endocrine Endocrine Symptoms Reported: No symptoms reported Is patient diabetic?: Yes Is patient checking blood sugars at home?: Yes List most recent blood sugar readings, include date and time of day: BS 105 Fating today. patient denies any signs or symptoms of Hypoglycemia or Hyperglycemia.    Gastrointestinal Gastrointestinal Symptoms Reported: No symptoms reported Other Gastrointestinal Symptoms: Patient denies any concerns with her bowel movememebt, she states last BM wa today and was normal.      Genitourinary Genitourinary Symptoms Reported: No symptoms reported    Integumentary Integumentary Symptoms Reported: No symptoms reported    Musculoskeletal Musculoskelatal Symptoms Reviewed: Unsteady gait, Difficulty walking Additional Musculoskeletal Details: Pt reports she uses a walker. Pateint reports she is following up with physical therapy.   Falls in the past year?:  (No falls since last encounter.)    Psychosocial Psychosocial Symptoms Reported: No symptoms reported         Today's Vitals   08/27/24 1352  BP: (!) 149/77  Pulse: 99   Pain Score: 0-No pain  Medications Reviewed Today     Reviewed by Lenita Fountain,  RN (Registered Nurse) on 08/27/24 at 1351  Med List Status: <None>   Medication Order Taking? Sig Documenting Provider Last Dose Status Informant  albuterol  (VENTOLIN  HFA) 108 (90 Base) MCG/ACT inhaler [id number] Yes Inhale 2 puffs into the lungs every 4 (four) hours as needed for wheezing or shortness of breath. Jeneal Danita Macintosh, MD  Active Pharmacy Records, Child   allopurinol  (ZYLOPRIM ) 100 MG tablet [id number] Yes Take 1 tablet (100 mg total) by mouth daily. Norleen Lynwood ORN, MD  Active   atropine  1 % ophthalmic solution [id number] Yes Place 1 drop into the left eye daily. [provider]  Active Pharmacy Records, Child           Med Note KARLYNN, HEDDY CHRISTELLA Schaumann Aug 13, 2024 11:23 AM)    Azelastine  HCl 137 MCG/SPRAY SOLN [id number] Yes Place into both nostrils. [provider]  Active   brimonidine  (ALPHAGAN ) 0.2 % ophthalmic solution [id number] Yes 3 (three) times daily. 1 drop in the left eye three times daily [provider]  Active Pharmacy Records, Child           Med Note KARLYNN, HEDDY CHRISTELLA Schaumann Aug 13, 2024 11:23 AM)    citalopram  (CELEXA ) 40 MG tablet [id number] Yes Take 1 tablet (40 mg total) by mouth daily. Norleen Lynwood ORN, MD  Active   colchicine  0.6 MG tablet [id number] Yes TAKE 0.5 TABLETS (0.3 MG TOTAL) BY MOUTH DAILY AS NEEDED (GOUT OR PSUEDOGOUT PAIN). Joane Artist RAMAN, MD  Active Pharmacy Records, Child  diltiazem  (CARDIZEM  CD) 180 MG 24 hr capsule [id number] Yes Take 1 capsule (180 mg total) by mouth daily. Shlomo Wilbert SAUNDERS, MD  Active Pharmacy Records, Child  diltiazem  (CARDIZEM  CD) 240 MG 24 hr capsule [id number]  Take 240 mg by mouth daily.  Patient not taking: Reported on 08/27/2024   [provider]  Active   empagliflozin  (JARDIANCE ) 25 MG TABS tablet [id number] Yes Take 1 tablet (25 mg total) by mouth daily. Norleen Lynwood ORN, MD  Active   erythromycin  ophthalmic ointment [id number] Yes Place 1 Application into the left eye at bedtime. [provider]  Active Pharmacy Records, Child  famotidine  (PEPCID ) 20 MG tablet [id number] Yes Take 1 tablet (20 mg total) by mouth 2 (two) times daily. Jeneal Danita Macintosh, MD  Active Pharmacy Records, Child           Med Note KARLYNN, HEDDY CHRISTELLA Schaumann Aug 13, 2024 11:25 AM)    Fluticasone -Umeclidin-Vilant (TRELEGY ELLIPTA ) 200-62.5-25 MCG/ACT AEPB [id number] Yes Inhale 1  puff into the lungs daily. Jeneal Danita Macintosh, MD  Active Pharmacy Records, Child           Med Note LORNE, SHEFFIELD BROCKS   Dju Jun 27, 2024  2:01 PM) Not found at the pt's home by son and daughter. Unsure if the pt is taking this at home.   hydrALAZINE  (APRESOLINE ) 25 MG tablet [id number] Yes Take 1 tablet (25 mg total) by mouth 3 (three) times daily. Norleen Lynwood ORN, MD  Active   latanoprost  (XALATAN ) 0.005 % ophthalmic solution [id number] Yes Place 1 drop into the left eye at bedtime. [provider]  Active Pharmacy Records, Child           Med Note LORNE, CHLOE C   Sat Jun 27, 2024  2:02 PM) Not found at the pt's home by son and daughter. Unsure if the pt is taking this at home.   levocetirizine (XYZAL ) 5 MG tablet  [id number] Yes Take 1 tablet (5 mg total) by mouth daily. Jeneal Danita Macintosh, MD  Active Pharmacy Records, Child           Med Note LORNE, SHEFFIELD BROCKS   Dju Jun 27, 2024  2:02 PM) Not found at the pt's home by son and daughter. Unsure if the pt is taking this at home.   Magnesium  400 MG TABS [id number] Yes 1 tab by mouth once daily for 5 days Norleen Lynwood ORN, MD  Active   meclizine  (ANTIVERT ) 25 MG tablet [id number] Yes TAKE 1 TABLET BY MOUTH 3 TIMES A DAY AS NEEDED FOR DIZZINESS Norleen Lynwood ORN, MD  Active Pharmacy Records, Child           Med Note LORNE, Kingsbury C   Sat Jun 27, 2024  2:02 PM) Not found at the pt's home by son and daughter. Unsure if the pt is taking this at home.   metoprolol  succinate (TOPROL -XL) 100 MG 24 hr tablet [id number] Yes Take 1 tablet (100 mg total) by mouth daily. Take with or immediately following a meal. Akula, Vijaya, MD  Active Pharmacy Records, Child  ondansetron  (ZOFRAN -ODT) 4 MG disintegrating tablet [id number] Yes Take 1 tablet (4 mg total) by mouth every 8 (eight) hours as needed for nausea or vomiting. Odell Celinda Balo, MD  Active   oxyCODONE -acetaminophen  (PERCOCET) 7.5-325 MG tablet [id number] Yes Take 1 tablet by mouth every 4  (four) hours as needed for severe pain (pain score 7-10). Norleen Lynwood ORN, MD  Active   pantoprazole  (PROTONIX ) 40 MG tablet [id number] Yes Take 1 tablet (40 mg total) by mouth daily. Norleen Lynwood ORN, MD  Active   rosuvastatin  (CRESTOR ) 40 MG tablet [id number] Yes Take 1 tablet (40 mg total) by mouth daily. Shlomo Wilbert SAUNDERS, MD  Active   traZODone  (DESYREL ) 100 MG tablet [id number] Yes Take 1 tablet (100 mg total) by mouth at bedtime as needed. for sleep Norleen Lynwood ORN, MD  Active           Recommendation:   Continue Current Plan of Care [facility] will request clarification regarding potassium and magnesium .   Follow Up Plan:   Telephone follow up appointment date/time:  09/10/24 at 1:30 p.m  Franklin Honour, RN, BSN [place]/ [facility] RN care manager Direct dial: [phone]

## 2024-08-28 ENCOUNTER — Encounter: Payer: Self-pay | Admitting: *Deleted

## 2024-08-28 ENCOUNTER — Other Ambulatory Visit: Payer: Self-pay | Admitting: Allergy

## 2024-08-28 NOTE — Patient Outreach (Signed)
 Complex Care Management   Visit Note  08/28/2024  Name:  ZONNIE LANDEN MRN: [id number] DOB: 1950/12/14  Situation: Received message from Corean Ku, FNP regarding clarification about Potassium and magnesium  dosage for patient, I see the same instructions from Dr Norleen that you do. I'm afraid I don't have more information than that. If the patient is unsure of the instructions, the best thing to do is recheck her labs and then advise from there. -RNCM called patient to discuss. No answer, HIPAA complaint message left. RNCM sent a message to Corean Ku to update and request to place orders for lab and/or follow-up appointment with patient if needed.    Follow Up Plan:   RNCM will continue to follow  Franklin Honour, RN, BSN [place]/ [facility] RN care manager Direct dial: [phone]

## 2024-09-01 ENCOUNTER — Ambulatory Visit: Admitting: Cardiology

## 2024-09-10 ENCOUNTER — Telehealth

## 2024-09-28 ENCOUNTER — Ambulatory Visit: Admitting: Internal Medicine

## 2024-10-05 ENCOUNTER — Encounter: Admitting: Internal Medicine

## 2024-10-05 ENCOUNTER — Ambulatory Visit

## 2024-11-10 ENCOUNTER — Inpatient Hospital Stay: Admitting: Hematology

## 2024-11-10 ENCOUNTER — Inpatient Hospital Stay

## 2025-01-26 ENCOUNTER — Encounter: Admitting: Family Medicine
